# Patient Record
Sex: Female | Born: 1945 | Race: White | Hispanic: No | Smoking: Former smoker
Health system: Southern US, Community
[De-identification: ages and names within clinical notes are randomized; demographics above are authoritative.]

## PROBLEM LIST (undated history)

## (undated) DIAGNOSIS — I219 Acute myocardial infarction, unspecified: Secondary | ICD-10-CM

## (undated) DIAGNOSIS — I509 Heart failure, unspecified: Secondary | ICD-10-CM

## (undated) DIAGNOSIS — J449 Chronic obstructive pulmonary disease, unspecified: Secondary | ICD-10-CM

## (undated) DIAGNOSIS — T8859XA Other complications of anesthesia, initial encounter: Secondary | ICD-10-CM

## (undated) DIAGNOSIS — I639 Cerebral infarction, unspecified: Secondary | ICD-10-CM

## (undated) DIAGNOSIS — H269 Unspecified cataract: Secondary | ICD-10-CM

## (undated) DIAGNOSIS — R011 Cardiac murmur, unspecified: Secondary | ICD-10-CM

## (undated) DIAGNOSIS — F329 Major depressive disorder, single episode, unspecified: Secondary | ICD-10-CM

## (undated) DIAGNOSIS — I739 Peripheral vascular disease, unspecified: Secondary | ICD-10-CM

## (undated) DIAGNOSIS — M1990 Unspecified osteoarthritis, unspecified site: Secondary | ICD-10-CM

## (undated) DIAGNOSIS — J181 Lobar pneumonia, unspecified organism: Secondary | ICD-10-CM

## (undated) DIAGNOSIS — T4145XA Adverse effect of unspecified anesthetic, initial encounter: Secondary | ICD-10-CM

## (undated) DIAGNOSIS — T7840XA Allergy, unspecified, initial encounter: Secondary | ICD-10-CM

## (undated) DIAGNOSIS — I10 Essential (primary) hypertension: Secondary | ICD-10-CM

## (undated) DIAGNOSIS — I471 Supraventricular tachycardia: Secondary | ICD-10-CM

## (undated) DIAGNOSIS — F17200 Nicotine dependence, unspecified, uncomplicated: Secondary | ICD-10-CM

## (undated) DIAGNOSIS — Q2112 Patent foramen ovale: Secondary | ICD-10-CM

## (undated) DIAGNOSIS — Z8709 Personal history of other diseases of the respiratory system: Secondary | ICD-10-CM

## (undated) DIAGNOSIS — K219 Gastro-esophageal reflux disease without esophagitis: Secondary | ICD-10-CM

## (undated) DIAGNOSIS — I82409 Acute embolism and thrombosis of unspecified deep veins of unspecified lower extremity: Secondary | ICD-10-CM

## (undated) DIAGNOSIS — I499 Cardiac arrhythmia, unspecified: Secondary | ICD-10-CM

## (undated) DIAGNOSIS — F32A Depression, unspecified: Secondary | ICD-10-CM

## (undated) DIAGNOSIS — M797 Fibromyalgia: Secondary | ICD-10-CM

## (undated) DIAGNOSIS — Z9981 Dependence on supplemental oxygen: Secondary | ICD-10-CM

## (undated) DIAGNOSIS — K449 Diaphragmatic hernia without obstruction or gangrene: Secondary | ICD-10-CM

## (undated) DIAGNOSIS — F419 Anxiety disorder, unspecified: Secondary | ICD-10-CM

## (undated) DIAGNOSIS — J984 Other disorders of lung: Secondary | ICD-10-CM

## (undated) DIAGNOSIS — E785 Hyperlipidemia, unspecified: Secondary | ICD-10-CM

## (undated) DIAGNOSIS — I4719 Other supraventricular tachycardia: Secondary | ICD-10-CM

## (undated) DIAGNOSIS — M810 Age-related osteoporosis without current pathological fracture: Secondary | ICD-10-CM

## (undated) DIAGNOSIS — K5792 Diverticulitis of intestine, part unspecified, without perforation or abscess without bleeding: Secondary | ICD-10-CM

## (undated) DIAGNOSIS — H35039 Hypertensive retinopathy, unspecified eye: Secondary | ICD-10-CM

## (undated) DIAGNOSIS — E669 Obesity, unspecified: Secondary | ICD-10-CM

## (undated) DIAGNOSIS — R0902 Hypoxemia: Secondary | ICD-10-CM

## (undated) DIAGNOSIS — Q211 Atrial septal defect: Secondary | ICD-10-CM

## (undated) HISTORY — DX: Essential (primary) hypertension: I10

## (undated) HISTORY — PX: ABDOMINAL HYSTERECTOMY: SHX81

## (undated) HISTORY — DX: Cardiac murmur, unspecified: R01.1

## (undated) HISTORY — PX: TOTAL ABDOMINAL HYSTERECTOMY: SHX209

## (undated) HISTORY — DX: Anxiety disorder, unspecified: F41.9

## (undated) HISTORY — DX: Heart failure, unspecified: I50.9

## (undated) HISTORY — DX: Major depressive disorder, single episode, unspecified: F32.9

## (undated) HISTORY — DX: Chronic obstructive pulmonary disease, unspecified: J44.9

## (undated) HISTORY — PX: CATARACT EXTRACTION: SUR2

## (undated) HISTORY — DX: Diverticulitis of intestine, part unspecified, without perforation or abscess without bleeding: K57.92

## (undated) HISTORY — DX: Allergy, unspecified, initial encounter: T78.40XA

## (undated) HISTORY — DX: Hyperlipidemia, unspecified: E78.5

## (undated) HISTORY — DX: Diaphragmatic hernia without obstruction or gangrene: K44.9

## (undated) HISTORY — DX: Depression, unspecified: F32.A

## (undated) HISTORY — DX: Other disorders of lung: J98.4

## (undated) HISTORY — DX: Hypoxemia: R09.02

## (undated) HISTORY — DX: Acute embolism and thrombosis of unspecified deep veins of unspecified lower extremity: I82.409

## (undated) HISTORY — DX: Gastro-esophageal reflux disease without esophagitis: K21.9

## (undated) HISTORY — DX: Cerebral infarction, unspecified: I63.9

## (undated) HISTORY — DX: Patent foramen ovale: Q21.12

## (undated) HISTORY — DX: Supraventricular tachycardia: I47.1

## (undated) HISTORY — DX: Age-related osteoporosis without current pathological fracture: M81.0

## (undated) HISTORY — PX: JOINT REPLACEMENT: SHX530

## (undated) HISTORY — DX: Unspecified cataract: H26.9

## (undated) HISTORY — DX: Hypertensive retinopathy, unspecified eye: H35.039

## (undated) HISTORY — DX: Lobar pneumonia, unspecified organism: J18.1

## (undated) HISTORY — DX: Fibromyalgia: M79.7

## (undated) HISTORY — DX: Unspecified osteoarthritis, unspecified site: M19.90

## (undated) HISTORY — DX: Cardiac arrhythmia, unspecified: I49.9

## (undated) HISTORY — DX: Nicotine dependence, unspecified, uncomplicated: F17.200

## (undated) HISTORY — DX: Atrial septal defect: Q21.1

## (undated) HISTORY — PX: EYE SURGERY: SHX253

## (undated) HISTORY — DX: Acute myocardial infarction, unspecified: I21.9

## (undated) HISTORY — PX: TUBAL LIGATION: SHX77

## (undated) HISTORY — DX: Other supraventricular tachycardia: I47.19

## (undated) HISTORY — DX: Obesity, unspecified: E66.9

## (undated) HISTORY — PX: MOUTH SURGERY: SHX715

---

## 1998-11-02 HISTORY — PX: KNEE ARTHROSCOPY: SUR90

## 1999-07-02 ENCOUNTER — Ambulatory Visit (HOSPITAL_COMMUNITY): Admission: RE | Admit: 1999-07-02 | Discharge: 1999-07-02 | Payer: Self-pay | Admitting: Emergency Medicine

## 2001-03-07 ENCOUNTER — Ambulatory Visit (HOSPITAL_BASED_OUTPATIENT_CLINIC_OR_DEPARTMENT_OTHER): Admission: RE | Admit: 2001-03-07 | Discharge: 2001-03-08 | Payer: Self-pay | Admitting: Orthopedic Surgery

## 2002-06-06 ENCOUNTER — Ambulatory Visit (HOSPITAL_COMMUNITY): Admission: RE | Admit: 2002-06-06 | Discharge: 2002-06-06 | Payer: Self-pay | Admitting: Emergency Medicine

## 2003-03-22 ENCOUNTER — Ambulatory Visit (HOSPITAL_COMMUNITY): Admission: RE | Admit: 2003-03-22 | Discharge: 2003-03-22 | Payer: Self-pay | Admitting: Gastroenterology

## 2003-08-22 ENCOUNTER — Encounter: Payer: Self-pay | Admitting: Gastroenterology

## 2003-08-22 ENCOUNTER — Ambulatory Visit (HOSPITAL_COMMUNITY): Admission: RE | Admit: 2003-08-22 | Discharge: 2003-08-22 | Payer: Self-pay | Admitting: Gastroenterology

## 2004-12-26 HISTORY — PX: CARDIOVASCULAR STRESS TEST: SHX262

## 2005-01-07 ENCOUNTER — Ambulatory Visit (HOSPITAL_COMMUNITY): Admission: RE | Admit: 2005-01-07 | Discharge: 2005-01-07 | Payer: Self-pay | Admitting: Emergency Medicine

## 2005-03-16 ENCOUNTER — Emergency Department (HOSPITAL_COMMUNITY): Admission: EM | Admit: 2005-03-16 | Discharge: 2005-03-16 | Payer: Self-pay | Admitting: Emergency Medicine

## 2005-06-29 ENCOUNTER — Inpatient Hospital Stay (HOSPITAL_COMMUNITY): Admission: RE | Admit: 2005-06-29 | Discharge: 2005-07-09 | Payer: Self-pay | Admitting: Orthopedic Surgery

## 2005-07-01 ENCOUNTER — Ambulatory Visit: Payer: Self-pay | Admitting: Emergency Medicine

## 2005-07-07 ENCOUNTER — Encounter: Payer: Self-pay | Admitting: Cardiology

## 2005-07-09 ENCOUNTER — Inpatient Hospital Stay
Admission: RE | Admit: 2005-07-09 | Discharge: 2005-07-14 | Payer: Self-pay | Admitting: Physical Medicine & Rehabilitation

## 2005-07-09 ENCOUNTER — Ambulatory Visit: Payer: Self-pay | Admitting: Physical Medicine & Rehabilitation

## 2005-08-25 ENCOUNTER — Emergency Department (HOSPITAL_COMMUNITY): Admission: EM | Admit: 2005-08-25 | Discharge: 2005-08-25 | Payer: Self-pay | Admitting: Emergency Medicine

## 2005-11-05 ENCOUNTER — Encounter: Admission: RE | Admit: 2005-11-05 | Discharge: 2005-11-05 | Payer: Self-pay | Admitting: Orthopedic Surgery

## 2008-05-01 ENCOUNTER — Ambulatory Visit (HOSPITAL_COMMUNITY): Admission: RE | Admit: 2008-05-01 | Discharge: 2008-05-01 | Payer: Self-pay | Admitting: Emergency Medicine

## 2008-06-15 ENCOUNTER — Encounter: Admission: RE | Admit: 2008-06-15 | Discharge: 2008-08-13 | Payer: Self-pay | Admitting: Emergency Medicine

## 2008-09-03 ENCOUNTER — Encounter: Admission: RE | Admit: 2008-09-03 | Discharge: 2008-09-03 | Payer: Self-pay | Admitting: Emergency Medicine

## 2009-04-16 ENCOUNTER — Encounter: Admission: RE | Admit: 2009-04-16 | Discharge: 2009-04-16 | Payer: Self-pay | Admitting: Orthopedic Surgery

## 2009-04-20 ENCOUNTER — Encounter: Admission: RE | Admit: 2009-04-20 | Discharge: 2009-04-20 | Payer: Self-pay | Admitting: Orthopedic Surgery

## 2009-05-29 ENCOUNTER — Ambulatory Visit (HOSPITAL_COMMUNITY): Admission: RE | Admit: 2009-05-29 | Discharge: 2009-05-29 | Payer: Self-pay | Admitting: Orthopedic Surgery

## 2009-06-04 ENCOUNTER — Ambulatory Visit: Payer: Self-pay | Admitting: Internal Medicine

## 2009-06-04 DIAGNOSIS — I10 Essential (primary) hypertension: Secondary | ICD-10-CM | POA: Insufficient documentation

## 2009-06-04 DIAGNOSIS — J438 Other emphysema: Secondary | ICD-10-CM | POA: Insufficient documentation

## 2009-06-04 DIAGNOSIS — F329 Major depressive disorder, single episode, unspecified: Secondary | ICD-10-CM | POA: Insufficient documentation

## 2009-06-04 DIAGNOSIS — F17200 Nicotine dependence, unspecified, uncomplicated: Secondary | ICD-10-CM

## 2009-06-04 DIAGNOSIS — Z9189 Other specified personal risk factors, not elsewhere classified: Secondary | ICD-10-CM | POA: Insufficient documentation

## 2009-06-04 DIAGNOSIS — K219 Gastro-esophageal reflux disease without esophagitis: Secondary | ICD-10-CM | POA: Insufficient documentation

## 2009-06-04 HISTORY — DX: Nicotine dependence, unspecified, uncomplicated: F17.200

## 2009-06-05 ENCOUNTER — Encounter: Payer: Self-pay | Admitting: Internal Medicine

## 2009-06-18 ENCOUNTER — Ambulatory Visit: Payer: Self-pay | Admitting: Pulmonary Disease

## 2009-07-09 ENCOUNTER — Encounter: Payer: Self-pay | Admitting: Pulmonary Disease

## 2009-07-09 ENCOUNTER — Ambulatory Visit (HOSPITAL_BASED_OUTPATIENT_CLINIC_OR_DEPARTMENT_OTHER): Admission: RE | Admit: 2009-07-09 | Discharge: 2009-07-09 | Payer: Self-pay | Admitting: Pulmonary Disease

## 2009-07-18 ENCOUNTER — Ambulatory Visit: Payer: Self-pay | Admitting: Pulmonary Disease

## 2009-07-24 ENCOUNTER — Ambulatory Visit: Payer: Self-pay | Admitting: Pulmonary Disease

## 2009-07-29 ENCOUNTER — Telehealth: Payer: Self-pay | Admitting: Pulmonary Disease

## 2009-08-01 ENCOUNTER — Telehealth (INDEPENDENT_AMBULATORY_CARE_PROVIDER_SITE_OTHER): Payer: Self-pay | Admitting: *Deleted

## 2009-08-19 ENCOUNTER — Ambulatory Visit: Admission: RE | Admit: 2009-08-19 | Discharge: 2009-08-19 | Payer: Self-pay | Admitting: Internal Medicine

## 2009-08-19 ENCOUNTER — Ambulatory Visit: Payer: Self-pay | Admitting: Internal Medicine

## 2009-08-22 ENCOUNTER — Telehealth: Payer: Self-pay | Admitting: Internal Medicine

## 2009-09-04 ENCOUNTER — Ambulatory Visit: Payer: Self-pay | Admitting: Internal Medicine

## 2009-09-11 ENCOUNTER — Encounter: Admission: RE | Admit: 2009-09-11 | Discharge: 2009-10-15 | Payer: Self-pay | Admitting: Emergency Medicine

## 2009-09-23 ENCOUNTER — Ambulatory Visit: Payer: Self-pay | Admitting: Internal Medicine

## 2009-10-08 ENCOUNTER — Ambulatory Visit: Payer: Self-pay | Admitting: Cardiovascular Disease

## 2009-10-14 ENCOUNTER — Ambulatory Visit: Payer: Self-pay | Admitting: Internal Medicine

## 2009-10-14 DIAGNOSIS — Q211 Atrial septal defect: Secondary | ICD-10-CM | POA: Insufficient documentation

## 2009-10-14 DIAGNOSIS — J984 Other disorders of lung: Secondary | ICD-10-CM

## 2009-10-14 HISTORY — DX: Other disorders of lung: J98.4

## 2009-11-19 ENCOUNTER — Encounter: Payer: Self-pay | Admitting: Internal Medicine

## 2009-11-20 ENCOUNTER — Encounter: Payer: Self-pay | Admitting: Internal Medicine

## 2009-11-20 HISTORY — PX: US ECHOCARDIOGRAPHY: HXRAD669

## 2009-11-21 ENCOUNTER — Telehealth (INDEPENDENT_AMBULATORY_CARE_PROVIDER_SITE_OTHER): Payer: Self-pay | Admitting: *Deleted

## 2009-12-03 ENCOUNTER — Encounter: Payer: Self-pay | Admitting: Internal Medicine

## 2009-12-13 ENCOUNTER — Encounter: Payer: Self-pay | Admitting: Internal Medicine

## 2009-12-20 ENCOUNTER — Encounter: Payer: Self-pay | Admitting: Cardiovascular Disease

## 2009-12-20 ENCOUNTER — Ambulatory Visit: Payer: Self-pay

## 2010-01-20 ENCOUNTER — Ambulatory Visit: Payer: Self-pay | Admitting: Internal Medicine

## 2010-02-28 ENCOUNTER — Encounter: Admission: RE | Admit: 2010-02-28 | Discharge: 2010-02-28 | Payer: Self-pay | Admitting: Emergency Medicine

## 2010-04-16 ENCOUNTER — Ambulatory Visit: Payer: Self-pay | Admitting: Internal Medicine

## 2010-04-16 ENCOUNTER — Encounter (INDEPENDENT_AMBULATORY_CARE_PROVIDER_SITE_OTHER): Payer: Self-pay | Admitting: Surgery

## 2010-04-16 ENCOUNTER — Inpatient Hospital Stay (HOSPITAL_COMMUNITY): Admission: RE | Admit: 2010-04-16 | Discharge: 2010-04-24 | Payer: Self-pay | Admitting: Surgery

## 2010-04-16 HISTORY — PX: LAPAROSCOPIC CHOLECYSTECTOMY: SUR755

## 2010-04-18 ENCOUNTER — Encounter (INDEPENDENT_AMBULATORY_CARE_PROVIDER_SITE_OTHER): Payer: Self-pay | Admitting: Surgery

## 2010-05-16 ENCOUNTER — Telehealth: Payer: Self-pay | Admitting: Internal Medicine

## 2010-06-04 ENCOUNTER — Ambulatory Visit: Payer: Self-pay | Admitting: Internal Medicine

## 2010-06-04 DIAGNOSIS — J018 Other acute sinusitis: Secondary | ICD-10-CM | POA: Insufficient documentation

## 2010-06-12 ENCOUNTER — Encounter (HOSPITAL_COMMUNITY): Admission: RE | Admit: 2010-06-12 | Discharge: 2010-08-01 | Payer: Self-pay | Admitting: Internal Medicine

## 2010-07-01 ENCOUNTER — Ambulatory Visit: Payer: Self-pay | Admitting: Cardiology

## 2010-07-10 ENCOUNTER — Telehealth (INDEPENDENT_AMBULATORY_CARE_PROVIDER_SITE_OTHER): Payer: Self-pay | Admitting: *Deleted

## 2010-07-10 ENCOUNTER — Encounter: Payer: Self-pay | Admitting: Internal Medicine

## 2010-07-11 ENCOUNTER — Ambulatory Visit: Payer: Self-pay | Admitting: Emergency Medicine

## 2010-07-11 DIAGNOSIS — J069 Acute upper respiratory infection, unspecified: Secondary | ICD-10-CM | POA: Insufficient documentation

## 2010-07-16 ENCOUNTER — Telehealth (INDEPENDENT_AMBULATORY_CARE_PROVIDER_SITE_OTHER): Payer: Self-pay | Admitting: *Deleted

## 2010-07-28 ENCOUNTER — Ambulatory Visit: Payer: Self-pay | Admitting: Internal Medicine

## 2010-07-28 ENCOUNTER — Telehealth (INDEPENDENT_AMBULATORY_CARE_PROVIDER_SITE_OTHER): Payer: Self-pay | Admitting: *Deleted

## 2010-09-22 ENCOUNTER — Ambulatory Visit: Payer: Self-pay | Admitting: Cardiology

## 2010-11-23 ENCOUNTER — Encounter: Payer: Self-pay | Admitting: Orthopedic Surgery

## 2010-11-23 ENCOUNTER — Encounter: Payer: Self-pay | Admitting: Emergency Medicine
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
## 2010-12-04 NOTE — Miscellaneous (Signed)
Summary: Orders Update  Clinical Lists Changes  Orders: Added new Test order of Venous Duplex Lower Extremity (Venous Duplex Lower) - Signed 

## 2010-12-04 NOTE — Miscellaneous (Signed)
Summary: ct chest   Clinical Lists Changes  Orders: Added new Referral order of Radiology Referral (Radiology) - Signed

## 2010-12-04 NOTE — Progress Notes (Signed)
Summary: ct scan  Phone Note Call from Patient   Caller: Patient Call For: Xiao Graul Summary of Call: pt concern that medicare will not pay for ct scan of the chest without contrast which is to be done on 07/01/2010 Initial call taken by: Rickard Patience,  May 16, 2010 10:20 AM  Follow-up for Phone Call        called and spoke with pt and she is aware that the ct scan has to be approved prior to her ct scan.  appt made for hfu for 8-3 at 2:10 wtih MR.  pt is aware. Randell Loop CMA  May 16, 2010 10:29 AM

## 2010-12-04 NOTE — Assessment & Plan Note (Signed)
Summary: URI   Visit Type:  Acute visit Copy to:  Dr. Cleta Alberts, Dr Don Perking Primary Provider/Referring Provider:  Dr Cleta Alberts PMD, Dr Pearlean Brownie Neuro, Dr. Betti Cruz - Pscyh, Dr. Titus Dubin - Rheum, Dr. Marciano Sequin - Ortho,   CC:  Acute visit...Dr. Marchelle Gearing Laurie Horton...Laurie Horton c/o increased sob...wheezing...cough with white foamy mucus...sneezing and runny nose x1 day and Hypertension Management.  History of Present Illness: 12 yowf smoker with GOLD 2 COPD, 02 dep at hs chronically   OV8/12/2009: Since lst visit in march 2011, she got admitted ot ICU post elective chole with resp failure and bilateral LL consolidation/atelctasis. Since then has rallied around to being baseline health. Still reports stability in COPD. Compliant with symbicort and singulair. . Using O2 only at night. STill refusing to use it with exertion. No change in exertion. Able to walk mall. HAS QUIT SMOKING in interim. However, c/o new green sinus mucus whenshe gets up early in morning and after naps. Dnies worseing dyspnea, worsening cough, or sputum or orthopnea, or wheezing. Not sure when she will have shoulder surgery due to financial constraints.   acute visit 07/11/10 -- Followed by Dr Marchelle Gearing for COPD, GOLD2, hypoxemia. Doesn't use O2 reliably with exertion (documented desats at pulm rehab). Presents today c/o intermittant chest tightness, mid-sternal CP, more wheezing. More sputum production thick and white. Has had nasal gtt, some URI symptoms. Feels wheezy now, worse when ambulating.      Hypertension History:      Positive major cardiovascular risk factors include female age 65 years old or older and hypertension.  Negative major cardiovascular risk factors include non-tobacco-user status.        Positive history for target organ damage include ASHD (either angina/prior MI/prior CABG).    Current Medications (verified): 1)  Cyclobenzaprine Hcl 10 Mg Tabs (Cyclobenzaprine Hcl) .... Take 1 Tablet Two Times A Day 2)  Isosorbide  Dinitrate 30 Mg Tabs (Isosorbide Dinitrate) .... Take 1 Tablet By Mouth Once A Day 3)  Trazodone Hcl 100 Mg Tabs (Trazodone Hcl) .... Take 3  Tablet By Mouth At Bedtime 4)  Pristiq 100 Mg Xr24h-Tab (Desvenlafaxine Succinate) .... Take 1 Tablet By Mouth Once A Day 5)  Toprol Xl 25 Mg Xr24h-Tab (Metoprolol Succinate) .... 1/2 Tablet Per Day 6)  Oxybutynin Chloride 5 Mg Tabs (Oxybutynin Chloride) .... Take 1 Tablet By Mouth Once A Day 7)  Norvasc 5 Mg Tabs (Amlodipine Besylate) .... Take 1 Tablet By Mouth Once A Day 8)  Lyrica 150 Mg Caps (Pregabalin) .... Take 1 Tablet By Mouth Once A Day At Bedtime 9)  Singulair 10 Mg Tabs (Montelukast Sodium) .... Take 1 Tablet By Mouth Once A Day 10)  Lipitor 80 Mg Tabs (Atorvastatin Calcium) .... Take 1 Tablet By Mouth Once A Day in The Evening 11)  Aciphex 20 Mg Tbec (Rabeprazole Sodium) .... Take 1 Tablet By Mouth Two Times A Day 12)  Lidoderm 5 % Ptch (Lidocaine) .... 3 Patches Daily 13)  Vicodin 5-500 Mg Tabs (Hydrocodone-Acetaminophen) .... As Needed 14)  Fish Oil 1000 Mg Caps (Omega-3 Fatty Acids) .... Once Daily 15)  Symbicort 80-4.5 Mcg/act Aero (Budesonide-Formoterol Fumarate) .... Take 2 Puffs Once Daily 16)  Oxygen 2 Liters .... At Naptime and Bedtime 17)  Vitamin B12 .... Once Daily 18)  Co Q10 .... Once Daily 19)  Vitamin C 500 Mg Chew (Ascorbic Acid) .... Once Daily 20)  Glucosamine-Chondroitin  Caps (Glucosamine-Chondroit-Vit C-Mn) .... Once Daily 21)  Aspirin 325 Mg Tabs (Aspirin) .... Take 1 Tablet By  Mouth Once A Day  Allergies (verified): 1)  ! Xanax 2)  ! Iodine 3)  ! * Mycins 4)  ! * Ciclor 5)  ! * Nexium 6)  ! * Lunesta 7)  ! * Ambien 8)  ! * Vesicare 9)  ! * Spiriva  Vital Signs:  Laurie Horton profile:   65 year old female Height:      64.5 inches (163.83 cm) Weight:      189.38 pounds (86.08 kg) BMI:     32.12 O2 Sat:      95 % on Room air Temp:     98.2 degrees F (36.78 degrees C) oral Pulse rate:   79 / minute BP  sitting:   122 / 80  (right arm) Cuff size:   regular  Vitals Entered By: Michel Bickers CMA (July 11, 2010 11:27 AM)  O2 Sat at Rest %:  95 O2 Flow:  Room air CC: Acute visit...Dr. Marchelle Gearing Laurie Horton...Laurie Horton c/o increased sob...wheezing...cough with white foamy mucus...sneezing and runny nose x1 day, Hypertension Management Comments Medications reviewed with the Laurie Horton. Daytime phone verified. Michel Bickers [facility]  July 11, 2010 11:28 AM   Physical Exam  General:  well developed, well nourished, in no acute distressobese.   Head:  normocephalic and atraumatic Eyes:  conjunctiva and sclera clear Nose:  no deformity, discharge, inflammation, or lesions Mouth:  no deformity or lesions Neck:  no masses, thyromegaly, or abnormal cervical nodes Chest Wall:  no deformities noted Lungs:  decreased BS bilateral and prolonged exhilation.   Heart:  regular rate and rhythm, S1, S2 without murmurs, rubs, gallops, or clicks Abdomen:  bowel sounds positive; abdomen soft and non-tender without masses, or organomegaly Msk:  no deformity or scoliosis noted with normal posture Pulses:  pulses normal Extremities:  no clubbing, cyanosis, edema, or deformity noted Neurologic:  CN II-XII grossly intact with normal reflexes, coordination, muscle strength and tone Skin:  intact without lesions or rashes Cervical Nodes:  no significant adenopathy Axillary Nodes:  no significant adenopathy Psych:  alert and cooperative; normal mood and affect; normal attention span and concentration   Impression & Recommendations:  Problem # 1:  UPPER RESPIRATORY INFECTION, VIRAL (ICD-465.9)  No evidence AE COPD yet, but at risk for this. Reviewed symptoms to watch for. Sh ewill call us if evolving - symptomatic relief  Orders: Est. Laurie Horton Level III (19147)  Problem # 2:  C O P D (ICD-496)  - BD's as ordered  Medications Added to Medication List This Visit: 1)  Fish Oil 1000 Mg Caps (Omega-3 fatty acids)  .... Once daily 2)  Symbicort 80-4.5 Mcg/act Aero (Budesonide-formoterol fumarate) .... Take 2 puffs once daily  Hypertension Assessment/Plan:      The Laurie Horton's hypertensive risk group is category C: Target organ damage and/or diabetes.  Today's blood pressure is 122/80.     Laurie Horton Instructions: 1)  You may use decongestants that contain bromphenerimine or chlorphenerimine, as directed.  2)  CALL OUR OFFICE if you develop any changes in your mucous production, change in color or amount. call if your breathing or wheeze worsen in any way.  3)  Follow up with Dr Marchelle Gearing in 2-3 weeks

## 2010-12-04 NOTE — Letter (Signed)
Summary: [facility]  [facility]   Imported By: Sherian Rein 12/13/2009 08:28:32  _____________________________________________________________________  External Attachment:    Type:   Image     Comment:   External Document
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
## 2010-12-04 NOTE — Progress Notes (Signed)
Summary: FYI  Phone Note Call from Patient   Caller: Patient Call For: RAMASWAMY Summary of Call: PT WAS TOLD TO CALL AND GIVE MED LIST :VITAMIN C 1000MG  B/12 GLUCOSAMINE 1500MG  CHONDROIDIN 1200MG  CO 10 50MG  CALCIUM 600MG  VENTOR IS [place] Initial call taken by: Rickard Patience,  July 28, 2010 11:43 AM  Follow-up for Phone Call        Spoke with pt and verified meds that need to be added to list.  Med list was updated. Follow-up by: Vernie Murders,  July 28, 2010 11:55 AM    New/Updated Medications: CALCIUM-D 600-200 MG-UNIT TABS (CALCIUM CARBONATE-VITAMIN D) 1 once daily VITAMIN C 1000 MG TABS (ASCORBIC ACID) 1 once daily VITAMIN B-12 500 MCG TABS (CYANOCOBALAMIN) 1 once daily GLUCOSAMINE 1500 COMPLEX  CAPS (GLUCOSAMINE-CHONDROIT-VIT C-MN) 1 once daily * CHONDROITON 1200 MG 1 once daily COQ-10 50 MG CAPS (COENZYME Q10) 1 once daily

## 2010-12-04 NOTE — Letter (Signed)
Summary: [facility]   Imported By: Sherian Rein 12/13/2009 08:46:20  _____________________________________________________________________  External Attachment:    Type:   Image     Comment:   External Document

## 2010-12-04 NOTE — Progress Notes (Signed)
Summary: office notes  Phone Note Call from Patient   Caller: Patient Call For: ramaswamy Complaint: Earache/Ear Infection Summary of Call: Please fax 12/10 Office notes to Dr. Elvis Coil Office Attn: Synetta Fail. Initial call taken by: Eugene Gavia,  November 21, 2009 5:05 PM  Follow-up for Phone Call        faxed notes 1/21//Juanita Follow-up by: Darletta Moll,  November 22, 2009 10:28 AM

## 2010-12-04 NOTE — Letter (Signed)
Summary: [facility]  [facility]   Imported By: Lester [place] 12/19/2009 09:20:56  _____________________________________________________________________  External Attachment:    Type:   Image     Comment:   External Document

## 2010-12-04 NOTE — Progress Notes (Signed)
Summary: quiting Pulm rehab  Phone Note Call from Patient Call back at Home Phone [phone]   Caller: Patient Call For: ramaswamy Reason for Call: Talk to Nurse Summary of Call: pt has attended Pulmonary rehab for 1 month.  She says she is being pressured everytime she goes that she needs to wear her 02 more or that she may need to buy excercise equip., says everytime she goes they tel  her she needs to do, wear or buy something else.  She said she is not going back, and just wanted to let you know. Initial call taken by: Eugene Gavia,  July 16, 2010 9:46 AM  Follow-up for Phone Call        Pt states she is not going back to pulmonary rehab because she states they pressure her to wear her oxygen more, and also pressure her with other recs and she does not want to go back. FYI for MR. Carron Curie CMA  July 16, 2010 10:38 AM   Additional Follow-up for Phone Call Additional follow up Details #1::        It is her free choice. We can only advise.  Additional Follow-up by: [name] MD,  July 16, 2010 2:31 PM    Additional Follow-up for Phone Call Additional follow up Details #2::    Called, spoke with pt.  She was informed per MR, he can only advise what he thinks is best but it is her free choice. She verbalized understanding.   Follow-up by: Gweneth Dimitri RN,  July 16, 2010 2:42 PM
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
## 2010-12-04 NOTE — Progress Notes (Signed)
Summary: ct results given and acute visit sched   Phone Note Call from Patient Call back at Home Phone [phone]   Caller: Patient Call For: ramaswamy Reason for Call: Talk to Nurse, Lab or Test Results Summary of Call: pt would like results of CT scan. Initial call taken by: Eugene Gavia,  July 10, 2010 4:10 PM  Follow-up for Phone Call        Spoke with pt and gave ct chest results per append.  Pt verbalized understanding.  She is c/o increased SOB, wheezing, and fluid retention since this am.  OV with RB tommorrow am 07/11/10 at 11 am.  Advised ER sooner if worsens. Follow-up by: Vernie Murders,  July 10, 2010 4:17 PM
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
## 2011-01-01 ENCOUNTER — Other Ambulatory Visit: Payer: Self-pay | Admitting: Emergency Medicine

## 2011-01-01 DIAGNOSIS — N2889 Other specified disorders of kidney and ureter: Secondary | ICD-10-CM

## 2011-01-05 ENCOUNTER — Encounter: Payer: Self-pay | Admitting: Internal Medicine

## 2011-01-05 ENCOUNTER — Ambulatory Visit (INDEPENDENT_AMBULATORY_CARE_PROVIDER_SITE_OTHER): Payer: Medicare Other | Admitting: Internal Medicine

## 2011-01-05 DIAGNOSIS — J449 Chronic obstructive pulmonary disease, unspecified: Secondary | ICD-10-CM

## 2011-01-05 DIAGNOSIS — J019 Acute sinusitis, unspecified: Secondary | ICD-10-CM
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
## 2011-01-18 LAB — BLOOD GAS, ARTERIAL
Acid-Base Excess: 1.6 mmol/L (ref 0.0–2.0)
Acid-Base Excess: 4.1 mmol/L — ABNORMAL HIGH (ref 0.0–2.0)
Bicarbonate: 28.1 mEq/L — ABNORMAL HIGH (ref 20.0–24.0)
Bicarbonate: 30.4 mEq/L — ABNORMAL HIGH (ref 20.0–24.0)
Drawn by: 331461
Drawn by: 331471
FIO2: 0.5 %
FIO2: 0.6 %
O2 Saturation: 81.5 %
Patient temperature: 98.6
Patient temperature: 99.4
TCO2: 24.6 mmol/L (ref 0–100)
TCO2: 26.6 mmol/L (ref 0–100)
TCO2: 27.1 mmol/L (ref 0–100)
pCO2 arterial: 41.6 mmHg (ref 35.0–45.0)
pCO2 arterial: 50.3 mmHg — ABNORMAL HIGH (ref 35.0–45.0)
pCO2 arterial: 55.1 mmHg — ABNORMAL HIGH (ref 35.0–45.0)
pCO2 arterial: 65.8 mmHg (ref 35.0–45.0)
pH, Arterial: 7.363 (ref 7.350–7.400)
pH, Arterial: 7.397 (ref 7.350–7.400)
pH, Arterial: 7.445 — ABNORMAL HIGH (ref 7.350–7.400)
pO2, Arterial: 42.6 mmHg — ABNORMAL LOW (ref 80.0–100.0)

## 2011-01-18 LAB — CARDIAC PANEL(CRET KIN+CKTOT+MB+TROPI)
CK, MB: 1.9 ng/mL (ref 0.3–4.0)
CK, MB: 1.9 ng/mL (ref 0.3–4.0)
CK, MB: 2.1 ng/mL (ref 0.3–4.0)
CK, MB: 2.3 ng/mL (ref 0.3–4.0)
Relative Index: 1.8 (ref 0.0–2.5)
Relative Index: INVALID (ref 0.0–2.5)
Total CK: 34 U/L (ref 7–177)
Total CK: 82 U/L (ref 7–177)
Troponin I: 0.01 ng/mL (ref 0.00–0.06)
Troponin I: 0.02 ng/mL (ref 0.00–0.06)

## 2011-01-18 LAB — MAGNESIUM
Magnesium: 1.7 mg/dL (ref 1.5–2.5)
Magnesium: 1.9 mg/dL (ref 1.5–2.5)
Magnesium: 1.9 mg/dL (ref 1.5–2.5)

## 2011-01-18 LAB — BASIC METABOLIC PANEL
BUN: 10 mg/dL (ref 6–23)
BUN: 16 mg/dL (ref 6–23)
BUN: 21 mg/dL (ref 6–23)
CO2: 30 mEq/L (ref 19–32)
CO2: 31 mEq/L (ref 19–32)
Calcium: 8.8 mg/dL (ref 8.4–10.5)
Calcium: 8.9 mg/dL (ref 8.4–10.5)
Calcium: 9.3 mg/dL (ref 8.4–10.5)
Calcium: 9.5 mg/dL (ref 8.4–10.5)
Calcium: 9.6 mg/dL (ref 8.4–10.5)
Chloride: 93 mEq/L — ABNORMAL LOW (ref 96–112)
Chloride: 94 mEq/L — ABNORMAL LOW (ref 96–112)
Chloride: 96 mEq/L (ref 96–112)
Chloride: 96 mEq/L (ref 96–112)
Creatinine, Ser: 0.54 mg/dL (ref 0.4–1.2)
Creatinine, Ser: 0.64 mg/dL (ref 0.4–1.2)
Creatinine, Ser: 0.72 mg/dL (ref 0.4–1.2)
Creatinine, Ser: 0.78 mg/dL (ref 0.4–1.2)
Creatinine, Ser: 0.95 mg/dL (ref 0.4–1.2)
GFR calc Af Amer: >60 mL/min (ref >60)
GFR calc Af Amer: >60 mL/min (ref >60)
GFR calc Af Amer: >60 mL/min (ref >60)
GFR calc Af Amer: >60 mL/min (ref >60)
GFR calc Af Amer: >60 mL/min (ref >60)
GFR calc non Af Amer: >60 mL/min (ref >60)
GFR calc non Af Amer: >60 mL/min (ref >60)
GFR calc non Af Amer: >60 mL/min (ref >60)
GFR calc non Af Amer: >60 mL/min (ref >60)
Glucose, Bld: 100 mg/dL — ABNORMAL HIGH (ref 70–99)
Glucose, Bld: 110 mg/dL — ABNORMAL HIGH (ref 70–99)
Glucose, Bld: 120 mg/dL — ABNORMAL HIGH (ref 70–99)
Glucose, Bld: 139 mg/dL — ABNORMAL HIGH (ref 70–99)
Glucose, Bld: 142 mg/dL — ABNORMAL HIGH (ref 70–99)
Potassium: 3.1 mEq/L — ABNORMAL LOW (ref 3.5–5.1)
Potassium: 3.2 mEq/L — ABNORMAL LOW (ref 3.5–5.1)
Potassium: 3.4 mEq/L — ABNORMAL LOW (ref 3.5–5.1)
Potassium: 4.5 mEq/L (ref 3.5–5.1)
Sodium: 134 mEq/L — ABNORMAL LOW (ref 135–145)
Sodium: 134 mEq/L — ABNORMAL LOW (ref 135–145)
Sodium: 134 mEq/L — ABNORMAL LOW (ref 135–145)
Sodium: 136 mEq/L (ref 135–145)

## 2011-01-18 LAB — GLUCOSE, CAPILLARY
Glucose-Capillary: 106 mg/dL — ABNORMAL HIGH (ref 70–99)
Glucose-Capillary: 127 mg/dL — ABNORMAL HIGH (ref 70–99)
Glucose-Capillary: 131 mg/dL — ABNORMAL HIGH (ref 70–99)
Glucose-Capillary: 131 mg/dL — ABNORMAL HIGH (ref 70–99)
Glucose-Capillary: 139 mg/dL — ABNORMAL HIGH (ref 70–99)
Glucose-Capillary: 141 mg/dL — ABNORMAL HIGH (ref 70–99)
Glucose-Capillary: 141 mg/dL — ABNORMAL HIGH (ref 70–99)
Glucose-Capillary: 141 mg/dL — ABNORMAL HIGH (ref 70–99)
Glucose-Capillary: 142 mg/dL — ABNORMAL HIGH (ref 70–99)
Glucose-Capillary: 145 mg/dL — ABNORMAL HIGH (ref 70–99)
Glucose-Capillary: 154 mg/dL — ABNORMAL HIGH (ref 70–99)
Glucose-Capillary: 155 mg/dL — ABNORMAL HIGH (ref 70–99)
Glucose-Capillary: 165 mg/dL — ABNORMAL HIGH (ref 70–99)
Glucose-Capillary: 171 mg/dL — ABNORMAL HIGH (ref 70–99)
Glucose-Capillary: 192 mg/dL — ABNORMAL HIGH (ref 70–99)
Glucose-Capillary: 228 mg/dL — ABNORMAL HIGH (ref 70–99)

## 2011-01-18 LAB — CBC
HCT: 44.7 % (ref 36.0–46.0)
HCT: 47.9 % — ABNORMAL HIGH (ref 36.0–46.0)
Hemoglobin: 13.1 g/dL (ref 12.0–15.0)
Hemoglobin: 15 g/dL (ref 12.0–15.0)
Hemoglobin: 16.1 g/dL — ABNORMAL HIGH (ref 12.0–15.0)
MCHC: 33 g/dL (ref 30.0–36.0)
MCHC: 33.4 g/dL (ref 30.0–36.0)
MCV: 100.4 fL — ABNORMAL HIGH (ref 78.0–100.0)
MCV: 101.1 fL — ABNORMAL HIGH (ref 78.0–100.0)
MCV: 99.2 fL (ref 78.0–100.0)
Platelets: 181 10*3/uL (ref 150–400)
RBC: 3.94 MIL/uL (ref 3.87–5.11)
RBC: 4.22 MIL/uL (ref 3.87–5.11)
RBC: 4.47 MIL/uL (ref 3.87–5.11)
RBC: 4.65 MIL/uL (ref 3.87–5.11)
RBC: 4.78 MIL/uL (ref 3.87–5.11)
RDW: 13.2 % (ref 11.5–15.5)
RDW: 13.2 % (ref 11.5–15.5)
RDW: 13.7 % (ref 11.5–15.5)
WBC: 10.4 10*3/uL (ref 4.0–10.5)
WBC: 24.1 10*3/uL — ABNORMAL HIGH (ref 4.0–10.5)
WBC: 6.8 10*3/uL (ref 4.0–10.5)
WBC: 7.6 10*3/uL (ref 4.0–10.5)

## 2011-01-18 LAB — CULTURE, BLOOD (ROUTINE X 2): Culture: NO GROWTH

## 2011-01-18 LAB — PHOSPHORUS: Phosphorus: 3.7 mg/dL (ref 2.3–4.6)

## 2011-01-18 LAB — COMPREHENSIVE METABOLIC PANEL
CO2: 27 mEq/L (ref 19–32)
Calcium: 8.3 mg/dL — ABNORMAL LOW (ref 8.4–10.5)
Creatinine, Ser: 0.61 mg/dL (ref 0.4–1.2)
GFR calc Af Amer: >60 mL/min (ref >60)
GFR calc non Af Amer: >60 mL/min (ref >60)
Glucose, Bld: 125 mg/dL — ABNORMAL HIGH (ref 70–99)

## 2011-01-18 LAB — BRAIN NATRIURETIC PEPTIDE: Pro B Natriuretic peptide (BNP): 124 pg/mL — ABNORMAL HIGH (ref 0.0–100.0)

## 2011-01-18 LAB — PROCALCITONIN: Procalcitonin: <0.5 ng/mL

## 2011-01-18 LAB — LACTIC ACID, PLASMA: Lactic Acid, Venous: 1.7 mmol/L (ref 0.5–2.2)

## 2011-01-19 LAB — COMPREHENSIVE METABOLIC PANEL
Alkaline Phosphatase: 73 U/L (ref 39–117)
BUN: 12 mg/dL (ref 6–23)
Chloride: 99 mEq/L (ref 96–112)
Creatinine, Ser: 0.76 mg/dL (ref 0.4–1.2)
Glucose, Bld: 99 mg/dL (ref 70–99)
Potassium: 3.9 mEq/L (ref 3.5–5.1)
Total Bilirubin: 0.8 mg/dL (ref 0.3–1.2)
Total Protein: 6.5 g/dL (ref 6.0–8.3)

## 2011-01-19 LAB — DIFFERENTIAL
Lymphs Abs: 1.4 10*3/uL (ref 0.7–4.0)
Monocytes Relative: 8 % (ref 3–12)
Neutro Abs: 6.5 10*3/uL (ref 1.7–7.7)
Neutrophils Relative %: 75 % (ref 43–77)

## 2011-01-19 LAB — CBC
Hemoglobin: 16.1 g/dL — ABNORMAL HIGH (ref 12.0–15.0)
RBC: 4.66 MIL/uL (ref 3.87–5.11)
WBC: 8.7 10*3/uL (ref 4.0–10.5)

## 2011-02-03 ENCOUNTER — Telehealth: Payer: Self-pay | Admitting: Internal Medicine

## 2011-02-03 NOTE — Telephone Encounter (Signed)
Number 6 on her Medication list from 3/26 is Oxybutynin 5 mg every other day she wants this stricken from her record because she does not take this medicine and doesn't know what it is. She stated that this was an error on our part and she doesn't have any medication that she takes every other day. She can be reached at [phone].Vedia Coffer

## 2011-02-03 NOTE — Telephone Encounter (Signed)
Pt was last seen by MR on 01/05/11.  She has not yet been abstracted in Epic.  However, I do not see this medication in her chart in EMR.  Called [phone] - NA and unable to leave message.

## 2011-02-03 NOTE — Telephone Encounter (Signed)
Called, spoke with pt.  She was informed oxybutynin is not on her med list.  She states she was in here on 01/26/11 to see MR and it was then. I advised pt that we do not have a record that she seen him on this date and this medication is not on her active medication list now.  We do have record that she was in here on 01/05/11 to MR but nothing for 01/26/11.  Pt still insisting she was in here to see MR on 01/26/11.

## 2011-02-03 NOTE — Telephone Encounter (Signed)
ATC x 3 line was busy WCB 

## 2011-02-04 NOTE — Telephone Encounter (Signed)
Called, spoke with pt.  She is aware we do not have a record of her seeing MR on 01/26/11 and med not on her medication list.  She verbalized understanding of this but states she will bring in a copy of the paper she has stating she was in here on that date.  I advised her this is fine if this is what she wishes to do.  Advised she can have the front staff make a copy of it for her.  They will give [place] the copy of this paper.  She verbalized understanding of this.

## 2011-02-05 LAB — BLOOD GAS, ARTERIAL
Bicarbonate: 24.9 mEq/L — ABNORMAL HIGH (ref 20.0–24.0)
Patient temperature: 98.6
pCO2 arterial: 40.2 mmHg (ref 35.0–45.0)
pH, Arterial: 7.409 — ABNORMAL HIGH (ref 7.350–7.400)
pO2, Arterial: 56 mmHg — ABNORMAL LOW (ref 80.0–100.0)

## 2011-02-08 LAB — COMPREHENSIVE METABOLIC PANEL
ALT: 17 U/L (ref 0–35)
Alkaline Phosphatase: 75 U/L (ref 39–117)
Chloride: 101 mEq/L (ref 96–112)
Glucose, Bld: 125 mg/dL — ABNORMAL HIGH (ref 70–99)
Potassium: 3.8 mEq/L (ref 3.5–5.1)
Sodium: 141 mEq/L (ref 135–145)
Total Protein: 6.3 g/dL (ref 6.0–8.3)

## 2011-02-08 LAB — CBC
Hemoglobin: 15.9 g/dL — ABNORMAL HIGH (ref 12.0–15.0)
RBC: 4.92 MIL/uL (ref 3.87–5.11)
RDW: 13.6 % (ref 11.5–15.5)
WBC: 6.6 10*3/uL (ref 4.0–10.5)

## 2011-02-08 LAB — DIFFERENTIAL
Basophils Relative: 0 % (ref 0–1)
Eosinophils Absolute: 0.1 10*3/uL (ref 0.0–0.7)
Monocytes Absolute: 0.5 10*3/uL (ref 0.1–1.0)
Monocytes Relative: 8 % (ref 3–12)
Neutrophils Relative %: 68 % (ref 43–77)

## 2011-02-08 LAB — PROTIME-INR: INR: 1.2 (ref 0.00–1.49)

## 2011-02-08 LAB — GLUCOSE, CAPILLARY: Glucose-Capillary: 115 mg/dL — ABNORMAL HIGH (ref 70–99)

## 2011-02-26 ENCOUNTER — Telehealth: Payer: Self-pay | Admitting: Internal Medicine

## 2011-02-26 DIAGNOSIS — J449 Chronic obstructive pulmonary disease, unspecified: Secondary | ICD-10-CM

## 2011-02-26 NOTE — Telephone Encounter (Signed)
Spoke w/ pt and she states she wants an order for her to have portable o2 so she can start exercising to lose weight. Pt also states she has 4 o2 tanks in her bedroom and has been their for 1 year now. Pt wants this to be picked up b/c it scares her to have this many tanks in her home. Please advise Dr. Marchelle Gearing. Thanks  Carver Fila, CMA    *Pt aware MR is out of the office today

## 2011-03-02 NOTE — Telephone Encounter (Signed)
atc # provided - NA and unable to leave message.  WCB
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
## 2011-03-02 NOTE — Telephone Encounter (Signed)
She can have portable o2 for convenience. Please do order  COPD exercise: . I know she went to rehab. Did she finish it ? I last recollect she did not find rehab beneficial . Please check if she is interested in going back to rehab or wants to exercise on own

## 2011-03-03 ENCOUNTER — Other Ambulatory Visit: Payer: Self-pay

## 2011-03-03 NOTE — Telephone Encounter (Signed)
ATC # provided to inform pt order sent to [facility] to have 4 o2 tanks in her bedroom removed - NA and unable to leave message.  WCB.

## 2011-03-03 NOTE — Telephone Encounter (Signed)
Ok send order

## 2011-03-04 NOTE — Telephone Encounter (Signed)
Spoke w/ pt and advised her order wass ent to have her big o2 tanks removed. Pt wanted to let Dr. Marchelle Gearing know that Dr. Leone Payor was going to admit her to the hospital either today or tomorrow. 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 Pt just wanted to make MR aware of this. Will forward to MR as an FYI.

## 2011-03-20 NOTE — Op Note (Signed)
   NAMEDAWNN, NAM                         ACCOUNT NO.:  [id number]   MEDICAL RECORD NO.:  [id number]                   PATIENT TYPE:  AMB   LOCATION:  ENDO                                 FACILITY:  [facility]   PHYSICIAN:  James L. Malon Kindle., M.D.          DATE OF BIRTH:  1945-11-16   DATE OF PROCEDURE:  03/22/2003  DATE OF DISCHARGE:                                 OPERATIVE REPORT   PROCEDURE PERFORMED:  Colonoscopy.   MEDICATIONS:  Fentanyl 100 mcg, Versed 10 mg IV.   INSTRUMENT USED:  Olympus pediatric colonoscope   INDICATIONS FOR PROCEDURE:  Colon cancer screening.  The patient just had an  endoscopy done just before this.   DESCRIPTION OF PROCEDURE:  The procedure had been explained to the patient  and consent obtained.  With the patient in the left lateral decubitus  position, the Olympus pediatric adjustable scope was inserted and advanced.  We advanced around to the cecum.  The ileocecal valve and appendiceal  orifice were seen.  The scope was withdrawn to the cecum.  Ascending colon,  transverse colon, descending and sigmoid colon were seen well.  No polyps or  other lesions seen.  No significant diverticular disease.  Rectum is free of  polyps.  The scope withdrawn.  The patient tolerated the procedure well.   ASSESSMENT:  Normal screening colonoscopy.   PLAN:  Routine follow up with yearly hemoccults.  Consider another  evaluation in ten years.                                               James L. Malon Kindle., M.D.    Waldron Session  D:  03/22/2003  T:  03/22/2003  Job:  [id number]   cc:   Brett Canales A. Cleta Alberts, M.D.  [address]  [place]  [place] [address]  Fax: [phone]

## 2011-03-20 NOTE — Consult Note (Signed)
NAMEKENZLIE, DISCH NO.:  [id number]   MEDICAL RECORD NO.:  [id number]          PATIENT TYPE:  INP   LOCATION:  1519                         FACILITY:  [facility]   PHYSICIAN:  Deirdre Peer. Polite, M.D. DATE OF BIRTH:  01/27/1946   DATE OF CONSULTATION:  06/30/2005  DATE OF DISCHARGE:                                   CONSULTATION   REASON FOR CONSULTATION:  Hypoxia, confusion.   HISTORY OF PRESENT ILLNESS:  A 65 year old female with multiple medical  problems who is postoperative day #1 after left total knee arthroscopy. On  rounds today, the patient was notified by P.A. to be confused and was found  to be hypoxic. [facility] was called for consultation in the interim.  CT was recommended to rule out PE. However, due to patient's contrast  allergy, there was delayed in getting CT. VQ has been ordered. Results are  pending at this time.   At the time of my evaluation, the patient is alert to self but still fairly  confused and constantly pulls off her oxygen. Her saturations range from 92%  on nasal cannula to 95% on face mask. However, the patient continues to  remove face mask or nasal cannula and at that time had drop in the  saturation to the mid 80s. The patient denies any chest pain, shortness of  breath. Denies any cough. Denies any fevers or chills. Denies any abdominal  pain. When asked why she is in the hospital, the patient is unaware of why  she is in the hospital. As stated, VQ scan is pending. The patient's ABG on  face mask shows pH of 7.3, pCO2 of 50, and a pO2 of 63.4; that is 50% face  mask. Other labs are pending. I recommend transfer to a step-down unit for  further management.   PAST MEDICAL HISTORY:  Per admission H&P includes:  1.  Major depression.  2.  Hypertension.  3.  Hiatal hernia.  4.  Coronary artery disease.  5.  COPD.   MEDICATIONS:  Per admission H&P include:  1.  Boniva.  2.  Toprol.  3.  Aspirin.  4.  Cymbalta.  5.   Propoxyphene.  6.  Mobic.  7.  Folgard.  8.  Aciphex.  9.  Singulair.  10. Coenzyme Q10.  11. Vitamin B1.  12. Vitamin D.  13. Magnesium.  14. Calcium.  15. Lidoderm.  16. Albuterol.  17. Mucinex.   ALLERGIES:  Per admission H&P include XANAX, MYCINS, LUNESTA, NEXIUM,  CECLOR, PYRIDIUM, CLARITIN, ALLEGRA, IODINE.   SOCIAL HISTORY:  Positive for tobacco. No alcohol. No drugs.   FAMILY HISTORY:  Noncontributory.   PAST MEDICAL HISTORY:  Per admission H&P.   REVIEW OF SYSTEMS:  As stated in the HPI.   PHYSICAL EXAMINATION:  VITAL SIGNS:  Temperature 98.6, BP 112/64, pulse 95,  respiratory rate 18.  HEENT:  Within normal limits.  CHEST:  Crackles bibasilar.  CARDIOVASCULAR:  Regular. S1 and S2. Occasional ectopic beat.  ABDOMEN:  Soft, nontender.  EXTREMITIES:  No clubbing, cyanosis, or edema. The patient does have a drain  in the left knee area.  NEUROLOGICAL:  Pleasantly confused. Moving all extremities. Occasionally  garbled slurring of speech.   LABORATORY DATA:  ABG as stated in HPI. BMET, CBC for today were within  normal limits.   ASSESSMENT:  1.  Hypoxic hypercarbic respiratory distress. Differential diagnosis      includes baseline chronic obstructive pulmonary disease with respiratory      depression secondary to narcotics. ___________ exclude out PE or other      cause, i.e. congestive heart failure.  2.  Confusion.  3.  Chronic obstructive pulmonary disease. Per report, no home oxygen use.  4.  Hypertension.  5.  Coronary artery disease.  6.  History of depression.  7.  Status post left total knee arthroplasty postoperative day #1.   Recommend patient be transferred to a step-down unit for further monitoring.  Currently, patient is able to maintain good saturations in the low 90s with  oxygen. However, due to the patient's confusion, she constantly removes her  oxygen. Therefore requires a higher level of care to be better monitored.  Agree with ruling  out PE. However, respiratory depression secondary to  narcotics is the most likely cause as the patient's saturations improved  dramatically after receiving Narcan. The patient will have a chest x-ray and  cardiac enzymes and will continue with DVT prophylaxis as outlined by  orthopedics. I will make further recommendations after reviewing the above  studies.      Deirdre Peer. Polite, M.D.  Electronically Signed     RDP/MEDQ  D:  06/30/2005  T:  06/30/2005  Job:  [id number]

## 2011-03-20 NOTE — Consult Note (Signed)
NAMEJACQLYN, [name] NO.:  [id number]   MEDICAL RECORD NO.:  [id number]          PATIENT TYPE:  INP   LOCATION:  0164                         FACILITY:  [facility]   PHYSICIAN:  Deanna Artis. Hickling, M.D.DATE OF BIRTH:  07/07/1946   DATE OF CONSULTATION:  07/03/2005  DATE OF DISCHARGE:                                   CONSULTATION   CHIEF COMPLAINT:  Altered mental status.   HISTORY OF PRESENT CONDITION:  I was asked by Dr. Priscille Kluver to see Laurie Horton, a 65 year old woman admitted with persistent left knee pain  following an injury.  The decision was made to perform a total knee  replacement.  This took place on June 29, 2005.  In the postoperative  period, the patient seemed to be somewhat confused.  She had initial  problems with hypoxia and did not want to keep her mask in place.  Pulmonary  embolus was ruled out.  Other etiologies of hypoventilation were considered,  but no clear cut etiology was discerned.   Patient had a CT scan of the brain which showed multiple infarctions that I  will discuss below.  Concerns are raised about the possibility that she had  an acute stroke.  I was asked to see her to determine the etiology of her  altered mental status and to make recommendations for further workup and  treatment.  An MRI of the brain had been planned today; however, it did not  take place and will be performed tomorrow.  In the interim, as the patient  has received less pain medication and has slept better, her delirium has  markedly improved.   PAST MEDICAL HISTORY:  1.  Depression.  2.  Hypertension.  3.  Hiatal hernia.  4.  Coronary artery disease.  5.  Chronic obstructive pulmonary disease.   PAST SURGICAL HISTORY:  1.  Arthroscopic surgery.  2.  Total knee replacement.   Patient is on multiple medications, including Boniva, Toprol, aspirin,  Cymbalta, propoxyphene, Mobic, Folgard, AcipHex, Singulair, Coenzyme Q10,  vitamin B1, vitamin D,  magnesium, calcium, Lidoderm, albuterol, and Mucinex.   DRUG ALLERGIES:  1.  XANAX.  2.  MYCIN MEDICATIONS.  3.  LUNESTA.  4.  PYRIDIUM.  5.  NEXIUM.  6.  CECLOR.  7.  CLARITIN.  8.  IODINE.   FAMILY HISTORY:  Negative for this medical problem.   SOCIAL HISTORY:  Patient smokes cigarettes.  She does not drink alcohol or  use drugs.   REVIEW OF SYSTEMS:  Negative except as noted above.   PHYSICAL EXAMINATION:  VITAL SIGNS:  Temperature 99.3, blood pressure  180/94, resting pulse 98, respirations 22.  Oxygen saturation 98%.  GENERAL:  This is a pleasant, well-developed and well-nourished woman in no  acute distress.  HEENT:  No infections.  LUNGS:  Clear.  HEART:  No murmurs.  Pulses normal.  No bruits.  EXTREMITIES:  She has had an operation on her left knee.  NEUROLOGIC:  Mini mental status 27/30.  She can name 19 animals in 60  seconds and rambled off task twice and had to be  reoriented.  Though her  memory is good, she rambles and at times is tangential.  Cranial nerves are  round and reactive.  Pupils:  Visual fields full.  Extraocular movements are  full to double and simultaneous stimuli.  Symmetric facial strength.  Midline tongue and uvula.  Air conduction greater than bone conduction.  Motor examination:  Normal strength, tone, and mass.  Good fine motor  movements.  No pronator drift.  I cannot test the left leg.  Sensation is  intact to cold, vibration, stereognosis, proprioception.  Cerebellar:  Good  finger to nose.  Rapid head movements.  Gait was not tested.  Reflexes were  normal in the upper extremities and the right lower extremity.  Toes are  bilaterally flexor.   I neglect to mention that over the past couple of years, the patient has had  episodes of altitudinal visual defects where she cannot see superiorly.  She  has been seen previously at [facility] by Dr. Shireen Quan.  I do not have those records available at this time.  It  is my  understanding that she has had at least a couple of MRI scans, one this  summer.  Apparently showed diffuse white matter disease that seems to be  increasing over time.   IMPRESSION:  1.  Delirium, 293.0, secondary to sleep deprivation and medications.  2.  Patient shows no focal or lateralized deficits.  I do not believe she      has had an acute cerebral infarction.  3.  Patient is rambling but does not have evidence of dementia.  I think      that she has mild cognitive impairment, which may be a precursor.  4.  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  The      etiology of this is unclear.  The risk factors for stroke include      coronary artery disease and hypertension.   I have reviewed the CT scan, and it shows what appears to be an infarction  in the left superior cerebellar region, the left internal limb of the  internal capsule, and superior to that, the frontal white matter, inferior  to that, the putamen, left external capsule, the left parietal subcortical  white matter, and bilaterally in the superficial subcortical white matter,  frontal and parietal lobes.  In addition, there appears to be mild cortical  atrophy.   I agree with the plan to perform an MRI scan.  I will review it.  I would  use sedative and pain medicines with great care, as I suspect that those are  responsible for the patient's delirium.  If you have any questions where I  can be of assistance, do not hesitate to contact me.  I will review the MRI  and comment as needed.      Deanna Artis. Sharene Skeans, M.D.  Electronically Signed     WHH/MEDQ  D:  07/03/2005  T:  07/03/2005  Job:  [id number]   cc:   Jonny Ruiz L. Rendall, M.D.  Fax: [phone]   Deirdre Peer. Polite, M.D.   Shan Levans, M.D. [facility]  [address]  [place]  [place] [address]   Brett Canales A. Cleta Alberts, M.D.  [address]  [place]  [place] [address] Fax: [phone]

## 2011-03-20 NOTE — H&P (Signed)
Laurie Horton, Laurie Horton             ACCOUNT NO.:  [id number]   MEDICAL RECORD NO.:  [id number]          PATIENT TYPE:  INP   LOCATION:  NA                           FACILITY:  [facility]   PHYSICIAN:  John L. Rendall, M.D.  DATE OF BIRTH:  September 23, 1946   DATE OF ADMISSION:  06/29/2005  DATE OF DISCHARGE:                                HISTORY & PHYSICAL   CHIEF COMPLAINT:  Left knee pain.   HISTORY OF PRESENT ILLNESS:  The patient is a 65 year old white female with  a many year history of progressively worsening left knee pain and multiple  arthroscopies.  The patient initially was injured after a fall.  She had two  arthroscopies done.  Patient denies any improvement with any of the above-  mentioned surgical procedures.  Patient is currently having a sharp burning-  type pain with ambulation.  She does have the knee giving way.  She denied  any night pain.  She does have occasional swelling in the knee.  Patient  denies any radiation of the pain up or down the leg.  She has a difficult  time ambulating without the use of assistive device.   ALLERGIES:  1.  XANAX.  2.  MYCINS.  3.  LUNESTA.  4.  NEXIUM.  5.  CECLOR.  6.  PYRAMIDON.  7.  CLARITIN D.  8.  ALLEGRA.  9.  __________.  10. IODINE.  11. Sensitive to multiple medications.   CURRENT MEDICATIONS:  1.  Boniva 1 tablet p.o. monthly.  2.  Toprol XL 12.5 mg p.o. q.a.m. and 25 mg p.o. q.h.s.  3.  Aspirin 81 mg p.o. daily.  4.  Cymbalta 60 mg p.o. b.i.d.  5.  Propoxyphene p.r.n.  6.  Rozerem 16 mg p.o. q.h.s.  7.  Estazolam 4 mg p.o. q.h.s.  8.  Mobic 7.5 mg p.o. daily.  9.  Folgard 1 tablet p.o. b.i.d.  10. Aciphex 20 mg p.o. b.i.d.  11. Singulair 10 mg p.o. q.h.s.  12. CoQ10 50 mg p.o. t.i.d.  13. Vitamin B1 100 mg p.o. at dinner.  14. Vitamin D 400 mg p.o. b.i.d.  15. Manganese 50 mg p.o. daily.  16. Magnesium malate 1250 mg p.o. t.i.d.  17. Calcium 500 mg p.o. t.i.d.  18. Lidoderm patches 3 patches q.12h. on either  the back, shoulders, arms or      legs.  19. Albuterol inhaler MDI 2 puffs q.a.m. and p.r.n.  20. Mucinex p.r.n.   PAST MEDICAL HISTORY:  1.  Major depression.  2.  Hypertension.  3.  Hiatal hernia.  4.  Reflux disease.  5.  Coronary artery disease.  6.  Carotid artery disease.  7.  Asthma and chronic obstructive pulmonary disease.  8.  Acid reflux.  9.  History of diverticulitis.  10. History of medication-related tremors.   PAST SURGICAL HISTORY:  1.  Left knee arthroscopy x 2.  2.  Hysterectomy in 1987.  3.  Patient states that she did have apneic events having to rely on a      ventilator after the hysterectomy, possibly a Betadine reaction.  SOCIAL HISTORY:  Patient is a 65 year old female.  She does smoke about a  pack and a half a day for the last 20 years.  She denies any alcohol.  She  is divorced.  She has two grown children.  She lives by herself in a two-  story house.  She is disability retired.   PHYSICIANS:  Family physician, Dr. Cleta Alberts at [phone].  Cardiologist, Dr. [place].  Gastroenterologist, Dr. Randa Evens.  Psychiatrist, Dr. Betti Cruz.   FAMILY MEDICAL HISTORY:  Mother is deceased at age of 83 with complications  related dementia and diabetes.  Father is deceased at the age of 65 from a  heart attack.  Patient has one brother deceased at the age of 48 from heart  disease.  Three sisters alive, one with paranoid schizophrenia, the second  with significant tremors, and the third with diabetes.   REVIEW OF SYSTEMS:  Positive for chronic cough related to her COPD and  asthma.  She does have some shortness of breath related to her COPD and  asthma.  She does wear glasses at all times.  She does have issues with her  reflux, but is fairly well controlled with her current medications.  Issues  with constipation and increased urinary frequency.  Patient also has  significant positive review of systems complaints related to fibromyalgia.   PHYSICAL EXAMINATION:   VITAL SIGNS:  Height 5 feet 4 inches, weight 160  pounds, blood pressure 130/70, pulse 68 and regular with an occasional  skipped beat, respirations 12, patient is afebrile.  GENERAL:  This is a healthy-appearing, well-developed 65 year old white  female who ambulates fairly easily, gets on and off the exam table easily.  HEENT:  Head is normocephalic.  Pupils are equal, round and reactive.  Extraocular movements are intact.  External ears intact.  Gross hearing was  intact.  NECK:  Supple, no palpable lymphadenopathy.  She had good range of motion  with the exception of rotation to the right which she says is a muscle pain  related to her fibromyalgia.  CHEST:  Lung sounds were clear and equal bilaterally, no wheezes, rales or  rhonchi.  HEART:  Regular rate and rhythm with an occasional skipped beat, no murmurs,  rubs or gallops.  ABDOMEN:  Soft and nontender.  Bowel sounds normoactive throughout.  EXTREMITIES:  Upper extremities were symmetrical size and shape.  She had  refused elevation of her shoulders above shoulder height related to muscle  pain related to her fibromyalgia.  She had 5/5 muscle strength in her  biceps, good range of motion of her elbows and wrists.  Lower extremities,  right and left hips had full extension, flexion up to 120 degrees with 20  degrees internal/external rotation without discomfort in her hips.  Her left  knee was normal-appearing, no signs of erythema or ecchymosis, no effusion.  She was generally sore throughout.  She was able to fully extend.  She was  only able to it back to about 110 degrees.  She says it is limited by pain.  There was no instability about the knee.  She had very fine crepitus on the  patella.  She had a very sore calf but no palpable cords, no heat and no  swelling.  Her right knee was without any signs of erythema or ecchymosis, no effusions.  She had just soreness throughout.  She had fine crepitus on  the patella.  She had  full extension.  She was able to flex it  back to 120  degrees, no instability.  The calf was nontender.  PERIPHERAL VASCULAR:  Carotid pulses were 2+, no bruits.  Radial pulses were  2+.  Dorsalis pedis pulses were 1+.  She had no lower extremity edema or  venous stasis changes.  NEUROLOGIC:  Patient was conscious, alert and appropriate.  She did continue  with excessive verbalization of all questions and her medical history.  She  had no other gross neurologic defects noted.  She did have some upper  extremity tremors in her hands which were constant.  BREAST:  Deferred at this time.  RECTAL:  Deferred at this time.  GENITOURINARY:  Deferred at this time.   IMPRESSION:  1.  Left knee pain.  2.  History of major depression.  3.  History of fibromyalgia.  4.  Hypertension.  5.  History of hiatal hernia.  6.  History of coronary artery disease with carotid artery disease.  7.  Asthma/chronic obstructive pulmonary disease.  8.  Reflux disease.  9.  History of diverticulitis.  10. History of osteoporosis with vertebral fractures.   PLAN:  The patient has been evaluated by her primary care physician and  cleared for this upcoming surgery, as well as cleared by her psychiatrist.  The patient will undergo all of the routine labs and tests prior to having  her left total knee arthroplasty done by Dr. Priscille Kluver with computer  navigation on June 29, 2005.      Jamelle Rushing, P.A.      John L. Rendall, M.D.  Electronically Signed    RWK/MEDQ  D:  06/23/2005  T:  06/23/2005  Job:  [id number]

## 2011-03-20 NOTE — Discharge Summary (Signed)
Laurie Horton, Laurie Horton             ACCOUNT NO.:  [id number]   MEDICAL RECORD NO.:  [id number]          PATIENT TYPE:  INP   LOCATION:  1518                         FACILITY:  [facility]   PHYSICIAN:  John L. Rendall, M.D.  DATE OF BIRTH:  [birth year]   DATE OF ADMISSION:  06/29/2005  DATE OF DISCHARGE:  07/09/2005                                 DISCHARGE SUMMARY   ADMISSION DIAGNOSES:  1.  End-stage osteoarthritis, left knee.  2.  Hypertension.  3.  Asthma/chronic obstructive pulmonary disease.  4.  Gastroesophageal reflux disease.  5.  Fibromyalgia.  6.  History of major depression.  7.  History of hiatal hernia.  8.  Coronary artery disease with history of carotid artery disease.  9.  History of diverticulitis.  10. History of osteoporosis with vertebral fractures.   DISCHARGE DIAGNOSES:  1.  End-stage osteoarthritis, left knee, status post left total knee      arthroplasty.  2.  Acute blood loss anemia secondary to surgery.  3.  Hypoxia, questionably due to medications.  4.  Possible pulmonary emboli, on 6 months' Coumadin therapy.  5.  Postoperative delirium; please note that the delirium is now resolved.  6.  Post cerebral artery non-hemorrhagic stroke.  7.  Hypertension.  8.  Asthma/chronic obstructive pulmonary disease.  9.  Gastroesophageal reflux disease.  10. Fibromyalgia.  11. Coronary artery disease with history of carotid artery disease.  12. History of major depression.  13. History of hiatal hernia.  14. History of diverticulitis.  15. History of osteoporosis with vertebral fractures.   SURGICAL PROCEDURE:  On June 29, 2005, Ms. Yahnke underwent a left total  knee replacement by Dr. Jonny Ruiz L. Rendall, assisted by Richardean Canal, P.A.-C.  This was with computer navigation. She had an LCS Complete RP insert size  standard 17.5 mm thickness placed with an LCS Complete primary femoral  component cemented size standard left and a DePuy MBT keeled tibial tray  cemented size 3. An LCS Complete metal-backed patella cemented size  standard.   COMPLICATIONS:  None.   CONSULTATIONS:  1.  Case management consult June 29, 2005.  2.  Physical therapy and [facility] consults June 30, 2005.  3.  Critical care medicine consult by Dr. Delford Field on July 01, 2005.  4.  Neurology consult by Dr. Sharene Skeans July 03, 2005.  5.  Rehab medicine consult July 06, 2005.   HISTORY OF PRESENT ILLNESS:  This 65 year old white female patient presented  to Dr. Priscille Kluver with a long history of progressively worsening left knee  pain. She has a history of multiple arthroscopies in the past and her  original problem stemmed from an injury after a fall. She has had no  improvement of her pain with the arthroscopies, now complaining of sharp-  burning pain with ambulation. The knee does give way. She also complains of  some swelling about the knee. She has failed conservative treatment and  because of that she is presenting for a left knee replacement.   HOSPITAL COURSE:  Ms. Mirkin tolerated the surgical procedure well without  immediate postoperative complications. She was  transferred to [facility]. On  postoperative day #1, she was a little sedated. She was also hypoxic.  Hemoglobin 11.6, hematocrit 32.8. She was started on therapy per protocol.  ABGs were checked. PCA and Percocet were discontinued and she was switched  to Vicodin for pain; this was monitored. An internal medicine consult was  also obtained at that time and they wanted to rule out PE and that workup  was obtained.   On postoperative day #2, V/Q scan showed no definite PE. She still remained  a bit hypoxic. She was also confused at that time, heart rate 120 to 130.  Hemoglobin 10.7 and hematocrit 30.8. A CT scan showed an old left basal  ganglia and lacunar infarct over prominent perivascular space and some small-  vessel changes. Medicines were used very cautiously. Later that day a   critical care medical consult was obtained due to her continued confusion  and hypoxia. They felt she needed treatment for possible PE, full-dose  Lovenox was started. She was also ordered a Doppler study to rule out DVT  and IV steroids. Respiratory status was aggressively treated with nebulizers  and steroids, and they monitored her for several days in the unit.   Over the next several days her hypoxia slowly improved. Confusion also  slowly improved. It was suspected that she had a pulmonary emboli, but we  could not do a CT scan due to her allergy to contrast. The critical care  medicine recommends treating her for 6 months with anticoagulation due to  probable PE. Her respiratory status did improve slowly. She also received 2  units of packed rbc's due to anemia with a hemoglobin of 8.7 on August 31;  she tolerated that well. Doppler study done on the 31st was negative for  DVT, superficial thrombus or Baker's cyst. Oxygen was slowly able to be  weaned.   On September 1, due to continued confusion, a neurology consult was obtained  by Dr. Sharene Skeans. He followed her throughout the rest of the hospitalization.  He felt at that time some of her confusion was due to sleep deprivation. An  MRI was recommended and ordered to be done to rule out stroke. In regards to  her knee, at this time her knee was doing very well with no signs of  complications.   She was actually able to be transferred back down to [facility] from the unit by  September 4 or 5. MRI was ordered of the brain and that was done on  September 5. Doppler study was also done on September 5, and that showed no  evidence of significant internal carotid artery stenosis, right was not  insonated and left vertebral artery flow was antegrade. MRI scan on the 5th,  however, did show a subacute stroke in the left occipital area and smaller cortical parieto-occipital stroke which did seem to explain most of her  symptoms. Dr. Sharene Skeans  has made recommendations for further workup after her  discharge. She was started on Coumadin for treatment of the PE to eventually  get rid of the Lovenox and she was continued on therapy.   At this time, July 09, 2005, she is doing well. Confusion seems to have  completely resolved. She is afebrile and vitals stable. Lungs are clear. O2  saturation is 91-93% on room air. Left knee incision is well approximated  with staples and no drainage. Neurology and medicine both feel that she is  stable for either transfer home with 24-hour  care or SACU/SNF level of  therapy. She is still having difficulty with balance, maintaining  instructions and a lack of visual field on the periphery on the right. It is  felt at this time she is ready for transfer to the subacute unit and she  will be transferred there later today.   DISCHARGE INSTRUCTIONS:   DIET:  She can continue her current hospitalization regular diet.   MEDICATIONS:  1.  Colace 100 mg p.o. b.i.d.  2.  Toprol-XL 12.5 mg p.o. q.a.m.  3.  Toprol-XL 25 mg p.o. nightly.  4.  Cymbalta 60 mg p.o. b.i.d.  5.  Protonix 40 mg p.o. b.i.d.  6.  Singulair 10 mg p.o. nightly.  7.  Vitamin B12 one tablet p.o. q.a.m.  8.  Calcium carbonate 500 mg p.o. t.i.d. with meals.  9.  Atrovent 0.5 mg/2.5 mL nebulizer solution inhaled q.6h.  10. Lovenox 75 mg subcutaneous q.12h., this can be discontinued once her      Coumadin is therapeutic.  11. Seroquel 100 mg p.o. nightly.  12. Ventolin 2.5 mg/3 mL nebulizer solution inhaled q.6h.  13. Prednisone 30 mg p.o. q.a.m. at 10 a.m. from September 6 to September 8,      and then 20 mg p.o. q.a.m. from September 9 to September 11, then      predinsone 10 mg p.o. q.a.m. from September 12 to September 14, and then      discontinue the prednisone.  14. Rozerem 8 mg p.o. nightly.  15. Lidoderm topical 5% patch 1 patch applied q.24h. and then it needs to be      removed after 12 hours.  16. Norvasc 5 mg p.o.  q.a.m.  17. Hydrochlorothiazide 25 mg p.o. q.a.m.  18. Imdur 30 mg p.o. q.a.m.  19. Zocor 40 mg p.o. every 6 p.m.  20. Coumadin dose to be adjusted to keep her INR between 2 and 2.5 and that      is to be given p.o. at 6 p.m. every evening.  21. Aspirin 81 mg p.o. q.a.m.  22. Senokot-S 1 tablet p.o. p.r.n. constipation.  23. Enema of choice, laxative of choice p.r.n. constipation.  24. Skelaxin 400 mg 1-2 tablets p.o. q.6h. p.r.n. for spasms.  25. Reglan 10 mg p.o. q.8h. p.r.n. reflux or nausea.  26. Albuterol multi-dose inhaler 2 puffs inhaled p.r.n. congestion.  27. Guaifenesin 600 mg p.o. b.i.d. p.r.n.  28. Vicodin 1-2 tablets p.o. q.4h. p.r.n. for pain.   ACTIVITY:  She can be out of bed weightbearing as tolerated on the left leg  with the use of a walker. She is to have PT and OT per rehab protocol for  the total knee and also stroke protocol.  WOUND CARE:  Please clean the left knee incision with alcohol daily and  apply a dry dressing. Staples can be removed and Steri-Strips with benzoin  applied on July 13, 2005. Please notify Dr. Priscille Kluver of temperature  greater than 101.5, chills, pain unrelieved by pain medications, or foul-  smelling drainage from the wound.   FOLLOWUP:  1.  She needs to follow up with Dr. Priscille Kluver in our office in approximately 2      weeks and you need to call [phone] for that appointment.  2.  She does need to follow up with Dr. Sharene Skeans and she is to call his      office after discharge at [phone] and they need to set up it looks like      a bubble  study to look for a PFO and also a followup neurologic exam.   LABORATORY DATA:  On August 29, pH on 50% O2 was 7.348 with a PCO2 15.9, PO2  of 63.4, bicarb of 27.3. On the 31st on 1% oxygen a pH was 7.409, PCO2 46.2,  PO2 177, bicarb 28.8.   Hemoglobin ranged from 15.5 with hematocrit of 44.5 on the 23rd to a low of  8.7 and 24.9 on the 31st. After transfusion it increased to 11.5 and 33.5 on   the 1st.   Sodium ranged right about 132 to 134. Potassium ranged from 3.4 on August  30, to 4.5 on the 23rd. Glucose ranged from 97 to a high of 146. Calcium  ranged from a low of 7.7 on the 31st to 9.4 on the 23rd and 8.8 on September  2. Hemoglobin A1c on September 5, was 5.4%. On August 29, CK was 520, CK-MB  10.8. On the 30th CK was 287, CK-MB 5.7. Troponins were within normal  limits.   On September 4, cholesterol was 239, triglycerides 222, HDL 37, ratio was  6.5, VLDL 44, and LDL 158. Urinalysis on August 23, showed trace hemoglobin,  all other studies within normal limits.   Lower extremity Doppler done on August 31, showed no evidence of DVT, SVT or  Baker's cyst bilaterally. Cerebrovascular carotid artery scan done on  September 5, showed evidence of significant internal carotid artery  stenosis, right was not insonated and left vertebral artery flow was  antegrade. MRI of the brain without contrast on September 5, showed an acute  infarct of the left occipital lobe and at the junction of the left  occipitoparietal lobe as described. This is superimposed upon moderate small-  vessel-type changes. CT of the head without contrast on September 3, showed  the infarct more conspicuous than on previous exam, no bleed or other acute  intracranial processes, and nonspecific white matter changes as previously  seen. The V/Q scan done on August 29, showed ventilation/perfusion matches  particularly on the right lung that would be compatible with chronic lung  change. This would be an indeterminate scan, however, pulmonary embolic  disease is thought to be doubtful.      Legrand Pitts Duffy, P.A.      John L. Rendall, M.D.  Electronically Signed    KED/MEDQ  D:  07/09/2005  T:  07/09/2005  Job:  [id number]   cc:   Deanna Artis. Sharene Skeans, M.D.  Fax: [phone]   Stan Head. Cleta Alberts, M.D.  Fax: [phone]

## 2011-03-20 NOTE — Op Note (Signed)
   Laurie Horton, Laurie Horton                         ACCOUNT NO.:  [id number]   MEDICAL RECORD NO.:  [id number]                   PATIENT TYPE:  AMB   LOCATION:  ENDO                                 FACILITY:  [facility]   PHYSICIAN:  James L. Malon Kindle., M.D.          DATE OF BIRTH:  February 16, 1946   DATE OF PROCEDURE:  03/22/2003  DATE OF DISCHARGE:                                 OPERATIVE REPORT   PROCEDURE PERFORMED:  Esophagogastroduodenoscopy.   MEDICATIONS:  Cetacaine spray, fentanyl 60 mcg, Versed 6 mg IV.   ENDOSCOPIST:  Llana Aliment. Randa Evens, M.D.   INDICATIONS FOR PROCEDURE:  Persistent right upper quadrant pain, left upper  quadrant pain, epigastric pain, esophageal reflux symptoms have not really  completely responded to Aciphex.   DESCRIPTION OF PROCEDURE:  The procedure had been explained to the patient  and consent obtained.  With the patient in left lateral decubitus position,  the Olympus scope was inserted and advanced.  We were able to advance down  into the stomach.  The gastric outlet was identified and passed.  The  duodenum including the bulb and second portion were seen well.  The scope  was withdrawn back into the stomach.  The antrum and body of the stomach  were seen well and were normal.  Fundus and cardia were seen well on the  retroflex view and appeared to be normal.  The GE junction was widely  patent.  The distal esophagus was endoscopically normal and possibly  slightly reddened.  The proximal esophagus was normal.  The scope was  withdrawn.  The patient tolerated the procedure well.   ASSESSMENT:  Gastroesophageal reflux disease.   PLAN:  Will continue on Aciphex, give antireflux instructions.  See back in  our office in two to three months and proceed with screening colonoscopy at  this time.                                               James L. Malon Kindle., M.D.    Waldron Session  D:  03/22/2003  T:  03/22/2003  Job:  [id number]   cc:   Brett Canales A. Cleta Alberts, M.D.  [address]  [place]  [place] [address]  Fax: [phone]

## 2011-03-20 NOTE — Op Note (Signed)
[place]  Patient:    Laurie Horton, Laurie Horton                      MRN: [id number] Proc. Date: 03/07/01 Attending:  Jonny Ruiz L. Dorothyann Gibbs, M.D.                           Operative Report  PREOPERATIVE DIAGNOSIS:  Internal derangement with probable torn lateral meniscus, left knee.  POSTOPERATIVE DIAGNOSIS:  Mild osteoarthritis, left knee.  OPERATION PERFORMED:  Arthroscopic chondroplasty, medial femoral condyle and lateral tibial plateau.  SURGEON:  John L. Dorothyann Gibbs, M.D.  ANESTHESIA:  General.  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  It is right on top of the anterior one third of the medial meniscus.  In the lateral compartment, there was chondromalacia near the intercondylar notch with a flap of hyaline cartilage that is irregular.  The lateral meniscus is entirely normal.  The medial meniscus is normal.  ACL, PCL, undersurface of the patella shows minor chondromalacia only on the central ridge over a small area.  The depth of the patellofemoral groove shows small crease in the articular surface there.  It correlates with the area of the central patellar ridge.  DESCRIPTION OF PROCEDURE:  Under general anesthesia, the left leg was prepared with Betadine and draped as a sterile field.  The standard portals were used after Xylocaine with epinephrine injection.  The findings noted above were found.  Using an intra-articular shaver, the medial fat pad fibrosis was resected and a local chondroplasty on the margin of the medial femoral condyle was carried out.  Then using different portals, the lateral tibial plateau was shaved.  Great care was taken to observe the suprapatellar pouch, patellofemoral articulation, medial and lateral recesses and to carefully probe both menisci.  No evidence of previous meniscectomy of  any significance was found.  Following this, the knee was copiously irrigated with saline, infiltrated with Marcaine with morphine with epinephrine and a sterile compressive bandage was applied.  The patient was returned to recovery in good condition. DD:  03/07/01 TD:  03/08/01 Job: [id number]

## 2011-03-20 NOTE — Discharge Summary (Signed)
Laurie Horton, CIARAVINO NO.:  [id number]   MEDICAL RECORD NO.:  [id number]          PATIENT TYPE:  ORB   LOCATION:  4529                         FACILITY:  [facility]   PHYSICIAN:  Ellwood Dense, M.D.   DATE OF BIRTH:  15-May-1946   DATE OF ADMISSION:  07/09/2005  DATE OF DISCHARGE:  07/14/2005                                 DISCHARGE SUMMARY   DISCHARGE DIAGNOSES:  1.  Embolic cerebrovascular accident secondary PFO.  2.  Left total knee replacement.  3.  Hyponatremia, much improved.  4.  History of depression.  5.  Chronic obstructive pulmonary disease.   HISTORY OF PRESENT ILLNESS:  Ms. Sinning is a 65 year old female with a  history of hypertension, left knee pain secondary to OA, and limited  ambulation. She elected to undergo a left total knee replacement on August  28th by Dr. Priscille Kluver. Postoperatively was __________ as tolerated and on  Arixtra for DVT prophylaxis. Her hospital course has been rocky with hypoxia  and confusion requiring transfer to the stepdown unit for a full workup. A  V/Q scan done was negative. As the patient's hypoxia continued, critical  care medicine was consulted and felt that the patient's respiratory distress  was COPD exacerbated by mucous plugging and atelectasis. A full dose of  subcu Lovenox was initiated as V/Q scan was indeterminate. Bilateral lower  extremity Dopplers were done that showed no evidence of DVT. The patient was  also treated with IV Solu-Medrol to help her respiratory status and  currently on steroid taper. CT of head was done on July 29th secondary to  the patient's confusion. This was initially negative. Follow-up CT showed  left subacute infarct. MRI done showed acute infarct left occipital lobe and  at the junction of the left occipital-parietal junction. Neurology was  consulted for input and a cardiac echo showing 55% to 65% EF with trivial  AVR. Carotid Doppler showed no significant ICA stenosis. The  patient's  mental status did improve, however she continued to have memory deficits  with decrease in insight and partial field cut. Neurology felt that the  patient's CVA was embolic from unknown source and recommended Coumadin with  INR 2 to 2.5 and bubble studies to rule out PE in the future. The patient  was currently being normalized physical therapy. She was at minimal assist  for transfers, minimal assist ambulating 200 feet with rolling walker  requiring verbal cues for ADLs. Subacute was consulted for further  therapies.   PAST MEDICAL HISTORY:  1.  COPD.  2.  Hypertension.  3.  GERD.  4.  Depression.  5.  Fibromyalgia.  6.  Diverticulosis.  7.  Left knee scope times two.  8.  Hysterectomy.   ALLERGIES:  XANAX, NEXIUM, MYSOLINE, LUNESTA, CECLOR, PYRIDIUM, CLARITIN,  ALLEGRA, ALLEGRA, LODINE, IODINE, and FLU VACCINE.   FAMILY HISTORY:  Positive for coronary artery disease, dementia,  schizophrenia, and diabetes. The patient is divorced, currently disabled,  and living in a two-level home with four to five steps to enter. She was  independent prior to admission. She smokes one-half pack per day.  Does not  use any alcohol.   HOSPITAL COURSE:  Ms. Anthonella Klausner was admitted to subacute on July 09, 2005, for __________ therapy to consist of PT and OT daily. Post  admission she was maintained on Vicodin and Robaxin for pain control, and  this has been very effective.  She was maintained on a __________ dose  Lovenox as Coumadin subtherapeutic. At the time of discharge the patient's  INR __________ at 1.9. She was discharged with daily protimes to be done  until Coumadin greater than 2.0 and she is to continue on treatment dose  Lovenox until Coumadin on a therapeutic basis.   Labs done post admission revealed sodium of 128, potassium 2.8, chloride 86,  CO2 31, BUN 17, creatinine 0.8, glucose 105. CBC revealed hemoglobin of  13.1, hematocrit 37.9, white count 13.7,  platelet count 337,000.  Leukocytosis was attributed to steroids. The patient has had daily CBCs done  while on Lovenox. Last check of CBC of September 12th showed hemoglobin of  9.8, hematocrit 34.1, white count 8.2, platelet count 371,000. Check of  electrolytes as of September 10th revealed sodium of 134, potassium 3.7,  chloride 90, CO2 34, BUN 10, creatinine 1.0, glucose 97.  The patient  remains on potassium supplement as long as she is on steroids. They are to  be tapered over the next two weeks. She was noted to have E. coli UTI and  was treated with Macrodantin for this.   The patient's knee incision was noted to be healing well without any signs  or symptoms of infection. Staples were discontinued and __________ at the  time of discharge. During her stay in subacute the patient has progressed  along well. She is currently at modified independent level for ADLs,  modified independent for transfers, modified independent for ambulating 200  feet with a rolling walker. Of  note the patient did have bubble study done  by Dr. [place] prior to discharge on September 12th. TEE revealed moderately  large PFO with shunt demonstrated by Dopplers and positive bubble studies.  Dr. [place]iland has discussed the case with Dr. Sharene Skeans and he and Dr. Sharene Skeans  will decide regarding further treatment as well as length of treatment for  Coumadin.  Dr. Cleta Alberts has graciously agreed to follow the patient's Coumadin  for now.  Daily protimes are being done with the next protime to be done on  September 13th with routine follow-up once INR is therapeutic. The patient  will also continue to receive further follow-up on home health PT and OT  post discharge. On July 14, 2005, the patient is discharge to home.   DISCHARGE MEDICATIONS:  1.  Seroquel 100 mg p.o. daily.  2.  __________  8 mg q.h.s.  3.  __________ 5 mg daily.  4.  Imdur 30 mg daily.  5.  Lipitor 20 mg q.h.s. 6.  Toprol XL 25 mg half in the  a.m. and one in the p.m.  7.  Cymbalta 60 mg b.i.d.  8.  AcipHex 20 mg b.i.d.  9.  Singulair 20 mg daily.  10. __________ one p.o. daily.  11. K-Dur 20 mEq daily through September 17th.  12. Prednisone taper through September 17th.   ACTIVITY LEVEL:  As tolerated with the use of walker.   DIET:  Regular.   WOUND CARE:  To wash the area with soap and water, keep clean and dry.   FOLLOWUP:  The patient is to follow up with Dr. Cleta Alberts for an  appointment in  two weeks, and follow up with Dr. Sharene Skeans for an appointment in one week.  Follow up with Dr. __________ for routine check. Follow up with Dr. Ellwood Dense as needed.      Greg Cutter, P.A.    ______________________________  Ellwood Dense, M.D.    PP/MEDQ  D:  07/15/2005  T:  07/15/2005  Job:  [id number]   cc:   Brett Canales A. Cleta Alberts, M.D.  [address]  [place]  [place] [address]  Fax: [phone]   Deanna Artis. Sharene Skeans, M.D.  Fax: [phone]   Dr. [place]

## 2011-03-20 NOTE — Consult Note (Signed)
Laurie Horton, TROTTER NO.:  [id number]   MEDICAL RECORD NO.:  [id number]          PATIENT TYPE:  INP   LOCATION:  0164                         FACILITY:  [facility]   PHYSICIAN:  Shan Levans, M.D. LHCDATE OF BIRTH:  1946-07-12   DATE OF CONSULTATION:  07/01/2005  DATE OF DISCHARGE:                                   CONSULTATION   CHIEF COMPLAINT:  Hypoxic respiratory failure.   HISTORY OF PRESENT ILLNESS:  This is a 65 year old white female we are asked  to see the evening on the 30th of August by Dr. Sable Feil physician  assistant for evaluation of hypoxic respiratory failure.  This patient has  been in the step-down unit  most of the day with progressive confusion,  agitation, hypoxemia.  Has had a previous ventilation-perfusion scan that is  indeterminate.  Concern of overt pulmonary embolism versus other cause of  hypoxic respiratory failure are raised.  The patient has a preexisting  history of heavy smoking, history of COPD on p.r.n. albuterol and Singulair  at home, actively smoking up until this admission.  She was admitted on  June 29, 2005 for elective left knee surgery for total left knee  replacement.  The patient underwent this procedure on June 29, 2005.  Postoperative, the patient has progressive hypoxemia and delirium, and we  are asked to see this patient for interval care.   PAST MEDICAL HISTORY:  1.  Hypertension.  2.  Reflux disease.  3.  Carotid disease.  4.  COPD.  5.  Asthma.  6.  Depression.  7.  Diverticulitis.  8.  Ongoing tobacco use.   PRIMARY CARE PHYSICIAN:  Brett Canales A. Cleta Alberts, M.D.   She does see Dr. Betti Cruz of psychiatry.   MEDICATIONS PRIOR TO ADMISSION:  __________  1.  Monthly Toprol XL 12.5 mg daily, 25 mg q.p.m.  2.  Aspirin 81 mg daily.  3.  Cymbalta 60 mg b.i.d.  4.  Darvocet p.r.n.  5.  __________ 16 mg q.h.s.  6.  Mobic 7.5 mg daily.  7.  AcipHex 20 mg b.i.d.  8.  Singulair 10 mg daily.  9.  CoQ enzyme 50 mg  t.i.d.  10. Multivitamin supplementation.  11. Lidoderm patch.  12. Calcium.  13. Albuterol p.r.n.  14. Mucinex.   MEDICATION ALLERGIES:  1.  XANAX.  2.  MYCINS.  3.  LUNESTA.  4.  CECLOR.  5.  PRIMIDONE.  6.  CLARITIN-D.  7.  ALLEGRA.  8.  IODINE.  9.  IVP CONTRAST DYE.   PHYSICAL EXAMINATION:  VITAL SIGNS:  Temp 97, blood pressure 130/80, pulse  90, saturation 92% on Venturi Mask.  Respirations 27.  GENERAL:  This is a hypoxic, ill-appearing white female in moderate  distress.  CHEST:  Inspiratory and expiratory wheeze with poor air movement.  HEART:  Regular rate and rhythm.  S3.  Normal S1 and S2.  ABDOMEN:  Soft and nontender.  EXTREMITIES:  No clubbing or edema.  SKIN:  Clear.  NEUROLOGIC:  Intact.  HEENT:  No jugular venous distention, lymphadenopathy.  Oropharynx clear.  NECK:  Supple.  LABORATORY DATA:  Sodium 133, potassium 3.4, chloride 96, CO2 30, BUN 7,  creatinine 0.8, blood sugar 115, calcium 8.4.  Hemoglobin 10.7, white count  8.9, platelet count 164.   Chest x-ray showed atelectatic changes and COPD changes.   IMPRESSION:  Chronic obstructive pulmonary disease exacerbation with mucus  plugging, atelectasis, and ventilation-perfusion mismatch and additionally  shunt, rule out, though, underlying pulmonary emboli, although doubt.   RECOMMENDATIONS:  Full dose of Lovenox.  Check venous Doppler studies of the  lower extremities.  Administer IV Solu-Medrol.  Intensify neb treatments.  Follow up with blood gases, labs, and chest x-ray.  Administer Seroquel for  agitation.  No IV contrast.  Avoid CT scanning due to the fact the patient  has a contrast allergy.  Transfer to ICU status.  See orders.      Shan Levans, M.D. [facility]  Electronically Signed     PW/MEDQ  D:  07/01/2005  T:  07/01/2005  Job:  [id number]   cc:   Jonny Ruiz L. Rendall, M.D.  Fax: [phone]   Stan Head. Cleta Alberts, M.D.  [address]  [place]  [place] [address]  Fax: [phone]

## 2011-03-20 NOTE — Discharge Summary (Signed)
NAMEFINLAY, [name] NO.:  [id number]   MEDICAL RECORD NO.:  [id number]          PATIENT TYPE:  ORB   LOCATION:  4529                         FACILITY:  [facility]   PHYSICIAN:  Ellwood Dense, M.D.   DATE OF BIRTH:  06-03-1946   DATE OF ADMISSION:  07/09/2005  DATE OF DISCHARGE:  07/14/2005                                 DISCHARGE SUMMARY   DISCHARGE DIAGNOSES:  1.   Dictation ended at this point.      Greg Cutter, P.A.    ______________________________  Ellwood Dense, M.D.    PP/MEDQ  D:  07/15/2005  T:  07/16/2005  Job:  [id number]

## 2011-03-20 NOTE — Consult Note (Signed)
NAMEMILLI, WOOLRIDGE NO.:  [id number]   MEDICAL RECORD NO.:  [id number]          PATIENT TYPE:  EMS   LOCATION:  ED                           FACILITY:  [facility]   PHYSICIAN:  Thornton Park. Daphine Deutscher, MD  DATE OF BIRTH:  1946-09-19   DATE OF CONSULTATION:  08/25/2005  DATE OF DISCHARGE:                                   CONSULTATION   CHIEF COMPLAINT:  1.  Palpable mass, recurrent, right upper quadrant.  No prior surgery.  2.  Evidence of gastroesophageal reflux disease.   Laurie Horton is a 65 year old lady seen today on August 25, 2005 with a  history of just recently going home from the hospital after a stroke back in  August.  She apparently was at Dr. Sable Feil office and had some nausea.  She had a palpable mass in the right upper quadrant, which he was concerned  about and referred her for evaluation.  Patient says she has had this for  about three years, and it pops in and out.  It causes, apparently some  muscular spasm.  It is not in an area where we typically see hernias, and it  could be that this is a rib that has a pseudoarthrosis and pops.  It  periodically gets out because she notes it when she bends over.   More importantly, she has recently had a stroke and is to see Dr. Billie Ruddy tomorrow but is in no state to consider any kind of elective  surgery.  There is no evidence of any incarceration or any bowel problem.   Therefore, I gave her a card and asked her to call and make a follow-up  appointment with me in a few weeks, and I can perhaps get some records from  Dr. Randa Evens to see if there is anything that would be helpful surgically.  I  would think that when she can undergo general anesthesia, then laparoscopy  could be performed to look in that upper abdomen to see if there is a hernia  or it is a rib that has broken and pseudoarthrosis may need to be repaired  or fixed.  We will plan return to the office for a follow-up appointment.   IMPRESSION:  No acute surgical problem today.      Thornton Park Daphine Deutscher, MD  Electronically Signed     MBM/MEDQ  D:  08/25/2005  T:  08/25/2005  Job:  [id number]   cc:   Jonny Ruiz L. Rendall, M.D.  Fax: [phone]   Llana Aliment. Malon Kindle., M.D.  Fax: [phone]

## 2011-03-20 NOTE — Op Note (Signed)
Laurie Horton, Laurie Horton             ACCOUNT NO.:  [id number]   MEDICAL RECORD NO.:  [id number]          PATIENT TYPE:  INP   LOCATION:  0001                         FACILITY:  [facility]   PHYSICIAN:  John L. Rendall, M.D.  DATE OF BIRTH:  [birth year]   DATE OF PROCEDURE:  06/29/2005  DATE OF DISCHARGE:                                 OPERATIVE REPORT   PREOPERATIVE DIAGNOSIS:  Osteoarthritis, left knee.   POSTOPERATIVE DIAGNOSIS:  Osteoarthritis, left knee.   SURGICAL PROCEDURE:  Left LCS total knee replacement with computer  navigation assistance.   SURGEON:  John L. Rendall, M.D.   ASSISTANT:  Richardean Canal, P.A.   ANESTHESIA:  General.   PATHOLOGY:  The patient has had chronic knee pain resistant to all  conservative measures and has early degenerative changes with erythema and  erosion of the articular surface at the periphery of the hyaline cartilage  medially and laterally with irregularity of the femoral condyles.   PROCEDURE:  Under general anesthesia, the left leg was prepared with  __________ and draped as a sterile field. Leg was wrapped out with an  Esmarch and a sterile tourniquet was elevated to 350 mm. Midline incision  was made. The patella was everted. Femur was sized closest to standard but  possible standard plus. The preparation is made and debridement for computer  navigation assistance. Two Schanz pins were then placed in the proximal  third tibia medially and distal third femur medially. With these in place,  the femoral head center was determined. The medial and lateral malleoli were  determined and computer mapping of the proximal tibia and distal femur was  carried out.   Once this was completed within 0.5 mm accuracy, attention was turned to  making the computer cuts. The tibial osteotomy was done first taking 8 mm  off the high side. Once this was done, a tensioner was inserted and care was  taken to balance the knee ligaments to within 1.5 mm varus,  whereas upon  insertion of the Schanz pins, the knee had 4 mm varus. Once this was done,  the tensioner was inserted. The gap was estimated to be 12.5 and the  standard components on the femur fits best. The anterior and posterior flare  of the distal femur were then cut within 1 mm of perfect anatomic accuracy,  actually 0.3 mm on internal rotation on the first cut. The distal femoral  cut was then made within 0.3 mm accuracy. Recessing guide is then used.  Lamina spreader was then inserted and remnants of the menisci and cruciates  were resected. A 12.5 spacer block was then inserted at flexion and  extension and it turns out that the 15 mm fit slightly better. The tibia is  then exposed. Center peg hole with fins was inserted. Trial of a #3 with 15  mm bearing and a standard femur was then done and excellent fit and  alignment are obtained, but there is some slight hyperextension of the knee.  It is in just slight varus of about 2 degrees. A 17.5 bearing was then  inserted and there  was more stability with the knee flexed and excellent  stability in extension. The knee does not hyperextend as it did. It was 4  degrees and now it is 1, and the knee is within 1-1/2 degrees of anatomic  axis, being in slight varus. Permanent components were then cemented in  place after osteotomizing the patella and washing out the knee with pulse  irrigation. Once this was completed and cement was hardened, excess was  removed. Care was checked taken to check behind the tibial bearing. The knee  is then washed out with pressure irrigation again. A medium Hemovac  was inserted. Tourniquet is let down at 1 hour 15 minutes. The knee was then  closed with #1 Tycron, 0 Vicryl and skin clips. Operative time approximately  1-1/2 hours. Case was delayed 45 minutes because of noticed broken surgical  scrub glove by the tech setting up the room and care was taken to change  everything for optimal results.            ______________________________  Carlisle Beers. Priscille Kluver, M.D.     Renato Gails  D:  06/29/2005  T:  06/29/2005  Job:  [id number]

## 2011-04-28 ENCOUNTER — Other Ambulatory Visit: Payer: Self-pay | Admitting: Orthopedic Surgery

## 2011-04-28 DIAGNOSIS — M25569 Pain in unspecified knee: Secondary | ICD-10-CM

## 2011-04-29 ENCOUNTER — Ambulatory Visit
Admission: RE | Admit: 2011-04-29 | Discharge: 2011-04-29 | Disposition: A | Discharge status: Home / Self Care | Payer: Medicare Other | Source: Ambulatory Visit | Attending: Orthopedic Surgery | Admitting: Orthopedic Surgery

## 2011-04-29 DIAGNOSIS — M25569 Pain in unspecified knee: Secondary | ICD-10-CM

## 2011-07-09 ENCOUNTER — Encounter: Payer: Self-pay | Admitting: Internal Medicine

## 2011-07-10 ENCOUNTER — Encounter: Payer: Self-pay | Admitting: Internal Medicine

## 2011-07-10 ENCOUNTER — Inpatient Hospital Stay (HOSPITAL_COMMUNITY): Payer: Medicare Other

## 2011-07-10 ENCOUNTER — Inpatient Hospital Stay (HOSPITAL_COMMUNITY)
Admission: AD | Admit: 2011-07-10 | Discharge: 2011-07-14 | DRG: 190 | Disposition: A | Discharge status: Home / Self Care | Payer: Medicare Other | Source: Ambulatory Visit | Attending: Pulmonary Disease | Admitting: Pulmonary Disease

## 2011-07-10 ENCOUNTER — Ambulatory Visit (INDEPENDENT_AMBULATORY_CARE_PROVIDER_SITE_OTHER): Payer: Medicare Other | Admitting: Internal Medicine

## 2011-07-10 VITALS — BP 100/80 | HR 79 | Temp 99.0°F | Ht 64.0 in | Wt 194.6 lb

## 2011-07-10 DIAGNOSIS — R0902 Hypoxemia: Secondary | ICD-10-CM | POA: Diagnosis not present

## 2011-07-10 DIAGNOSIS — J441 Chronic obstructive pulmonary disease with (acute) exacerbation: Secondary | ICD-10-CM

## 2011-07-10 DIAGNOSIS — J189 Pneumonia, unspecified organism: Secondary | ICD-10-CM | POA: Diagnosis present

## 2011-07-10 DIAGNOSIS — F17200 Nicotine dependence, unspecified, uncomplicated: Secondary | ICD-10-CM | POA: Diagnosis present

## 2011-07-10 DIAGNOSIS — Z79899 Other long term (current) drug therapy: Secondary | ICD-10-CM

## 2011-07-10 LAB — BLOOD GAS, ARTERIAL
Drawn by: 347861
O2 Content: 0.2 L/min
Patient temperature: 98.6
TCO2: 24.3 mmol/L (ref 0–100)
pH, Arterial: 7.445 — ABNORMAL HIGH (ref 7.350–7.400)

## 2011-07-10 LAB — DIFFERENTIAL
Eosinophils Absolute: 0.2 10*3/uL (ref 0.0–0.7)
Eosinophils Relative: 3 % (ref 0–5)
Lymphs Abs: 1.1 10*3/uL (ref 0.7–4.0)

## 2011-07-10 LAB — CBC
MCV: 92.5 fL (ref 78.0–100.0)
Platelets: 227 10*3/uL (ref 150–400)
RDW: 12.9 % (ref 11.5–15.5)
WBC: 5.8 10*3/uL (ref 4.0–10.5)

## 2011-07-10 LAB — BASIC METABOLIC PANEL
BUN: 10 mg/dL (ref 6–23)
Creatinine, Ser: 0.87 mg/dL (ref 0.50–1.10)
GFR calc Af Amer: >60 mL/min (ref >60)
GFR calc non Af Amer: >60 mL/min (ref >60)
Potassium: 4.6 mEq/L (ref 3.5–5.1)

## 2011-07-10 MED ORDER — LEVALBUTEROL HCL 0.63 MG/3ML IN NEBU
0.6300 mg | INHALATION_SOLUTION | Freq: Once | RESPIRATORY_TRACT | Status: AC
  Administered 2011-07-10: 0.63 mg via RESPIRATORY_TRACT

## 2011-07-10 MED ORDER — METHYLPREDNISOLONE ACETATE 80 MG/ML IJ SUSP
80.0000 mg | Freq: Once | INTRAMUSCULAR | Status: AC
  Administered 2011-07-10: 80 mg via INTRAMUSCULAR

## 2011-07-10 NOTE — Progress Notes (Signed)
Subjective:    Patient ID: Laurie Horton, female    DOB: [birth year], 65 y.o.   MRN: [id number]  HPI 89 yowf smoker with GOLD 2 COPD, exertional desat with refusal of o2, and LLL pulmonary micronodule since dec 2010   OV8/12/2009: Since lst visit in march 2011, she got admitted ot ICU post elective chole with resp failure and bilateral LL consolidation/atelctasis. Since then has rallied around to being baseline health. Still reports stability in COPD. Compliant with symbicort and singulair. . Using O2 only at night. STill refusing to use it with exertion. No change in exertion. Able to walk mall. HAS QUIT SMOKING in interim. However, c/o new green sinus mucus whenshe gets up early in morning and after naps. Dnies worseing dyspnea, worsening cough, or sputum or orthopnea, or wheezing. Not sure when she will have shoulder surgery due to financial constraints.   acute visit 07/11/10 -- Followed by Dr Marchelle Gearing for COPD, GOLD2, hypoxemia. Doesn't use O2 reliably with exertion (documented desats at pulm rehab). Presents today c/o intermittant chest tightness, mid-sternal CP, more wheezing. More sputum production thick and white. Has had nasal gtt, some URI symptoms. Feels wheezy now, worse when ambulating.   July 28, 2010: Followup visit. C/p wheezing for several weeks. Not resolved since seeing Dr. Delton Coombes. Now past 3 days also new yellow sputum that is mild in amounth. Denies fever, or change in baseline wheeze, cough, fever, sick contact. In terms of COPD, she quit going to [facility] rehab. Apparently they were overbearing and insisted she wear o2 with exertion. She states she is asympptomatic and feels well and is not worried about increased mortality risk, less ysmptoms. She states , " I have lived my life and do not need oxygen. I dont want those tanks or portable systems with me"   January 05, 2011: Acute visit. Known Gold stage 2 copd. Past 3 days, increased nasal congestion, yellow nasal drainage,  malaise, post nasal droip with associated cough. No increase in cough, dyspnea, wheeze from baseline. Feels left nostril is blocked despite saline spray. No fever, chest pain, hemoptysis, nausea, vomit, diarrhea, edema, syncope   INPATIENT ADMIT 07/10/2011: Presented to office acutely. Reports 1 week of progressive dyspnea, cough, sputum. Rates symptoms as severe. Associated central chest pain that is related to cough is present. Also low grade subjective fevers and extreme fatigue. Feels she will die. Also wheezing. Home nebs are not working. Denies hemoptysis, edema, syncope, falls, nausea, vomit and diarrhea. Reportedly compliant with nebs and o2 (but wants [place] to remove the tanks from home because she has concentrator, in past wanted [place] to dc both). Pulse ox today was 86% on room air at rest; worse than baseline  Past medical: no new developments  Sociall: continues to live alone. Only a neighbor is social support. Quit smoking.   Review of Systems  Constitutional: Negative for fever and unexpected weight change.  HENT: Positive for congestion and postnasal drip. Negative for ear pain, nosebleeds, sore throat, rhinorrhea, sneezing, trouble swallowing, dental problem and sinus pressure.   Eyes: Negative for redness and itching.  Respiratory: Positive for cough, chest tightness and shortness of breath. Negative for wheezing.   Cardiovascular: Negative for palpitations and leg swelling.  Gastrointestinal: Negative for nausea and vomiting.  Genitourinary: Negative for dysuria.  Musculoskeletal: Negative for joint swelling.  Skin: Negative for rash.  Neurological: Negative for headaches.  Hematological: Does not bruise/bleed easily.  Psychiatric/Behavioral: Negative for dysphoric mood. The patient is not nervous/anxious.  Objective:   Physical Exam General: obese., looks tired and exhausted but not in distress Head: normocephalic and atraumatic  Eyes: conjunctiva and sclera clear    Ears: TMs intact and clear with normal canals  Nose: deviated septum.  Mouth: no deformity or lesions  Neck: no masses, thyromegaly, or abnormal cervical nodes  Chest Wall: no deformities noted  Lungs: decreased BS bilateral and prolonged exhilation.  Heart: regular rate and rhythm, S1, S2 without murmurs, rubs, gallops, or clicks  Abdomen: bowel sounds positive; abdomen soft and non-tender without masses, or organomegaly  Msk: no deformity or scoliosis noted with normal posture  Pulses: pulses normal  Extremities: no clubbing, cyanosis, edema, or deformity noted  Neurologic: CN II-XII grossly intact with normal reflexes, coordination, muscle strength and tone  Skin: intact without lesions or rashes  Cervical Nodes: no significant adenopathy  Axillary Nodes: no significant adenopathy  Psych: alert and cooperative; normal mood and affect; normal attention span and concentration         Assessment & Plan:

## 2011-07-10 NOTE — Assessment & Plan Note (Signed)
Neb and im depot medrol given in office Post neb pulse ox was in 80s  PLAN -Admit [place] (she refused but after extensive counseling agreed) -Transport by self at her own risk (she refused admission via EMS. She wanted to go home first but after discussing with her she has agreed to drive to ER by self) - Check abg, cxr, stat labs  - IV doxy - IV solumedrol - Full Code  - At dc: need to address social needs, med reconciliation and dc of o2 tanks

## 2011-07-10 NOTE — Patient Instructions (Signed)
Admit [place]

## 2011-07-11 DIAGNOSIS — J189 Pneumonia, unspecified organism: Secondary | ICD-10-CM

## 2011-07-11 LAB — EXPECTORATED SPUTUM ASSESSMENT W GRAM STAIN, RFLX TO RESP C

## 2011-07-12 DIAGNOSIS — J189 Pneumonia, unspecified organism: Secondary | ICD-10-CM

## 2011-07-12 DIAGNOSIS — J441 Chronic obstructive pulmonary disease with (acute) exacerbation: Secondary | ICD-10-CM

## 2011-07-13 ENCOUNTER — Inpatient Hospital Stay (HOSPITAL_COMMUNITY): Payer: Medicare Other

## 2011-07-13 LAB — CULTURE, RESPIRATORY W GRAM STAIN

## 2011-07-14 DIAGNOSIS — J9600 Acute respiratory failure, unspecified whether with hypoxia or hypercapnia: Secondary | ICD-10-CM

## 2011-07-14 DIAGNOSIS — J189 Pneumonia, unspecified organism: Secondary | ICD-10-CM

## 2011-07-14 DIAGNOSIS — J441 Chronic obstructive pulmonary disease with (acute) exacerbation: Secondary | ICD-10-CM

## 2011-07-24 ENCOUNTER — Telehealth: Payer: Self-pay | Admitting: Internal Medicine

## 2011-07-24 ENCOUNTER — Ambulatory Visit (INDEPENDENT_AMBULATORY_CARE_PROVIDER_SITE_OTHER): Payer: Medicare Other | Admitting: Internal Medicine

## 2011-07-24 ENCOUNTER — Encounter: Payer: Self-pay | Admitting: Internal Medicine

## 2011-07-24 DIAGNOSIS — R911 Solitary pulmonary nodule: Secondary | ICD-10-CM

## 2011-07-24 DIAGNOSIS — J984 Other disorders of lung: Secondary | ICD-10-CM

## 2011-07-24 DIAGNOSIS — J189 Pneumonia, unspecified organism: Secondary | ICD-10-CM | POA: Insufficient documentation

## 2011-07-24 DIAGNOSIS — I2510 Atherosclerotic heart disease of native coronary artery without angina pectoris: Secondary | ICD-10-CM

## 2011-07-24 DIAGNOSIS — B370 Candidal stomatitis: Secondary | ICD-10-CM | POA: Insufficient documentation

## 2011-07-24 DIAGNOSIS — J449 Chronic obstructive pulmonary disease, unspecified: Secondary | ICD-10-CM

## 2011-07-24 HISTORY — DX: Pneumonia, unspecified organism: J18.9

## 2011-07-24 MED ORDER — BUDESONIDE 0.25 MG/2ML IN SUSP: 0.2500 mg | Freq: Two times a day (BID) | RESPIRATORY_TRACT | Status: DC

## 2011-07-24 MED ORDER — IPRATROPIUM BROMIDE 0.02 % IN SOLN: 500.0000 ug | Freq: Four times a day (QID) | RESPIRATORY_TRACT | Status: DC

## 2011-07-24 MED ORDER — NYSTATIN 100000 UNIT/ML MT SUSP: 500000.0000 [IU] | Freq: Four times a day (QID) | OROMUCOSAL | Status: DC

## 2011-07-24 NOTE — Progress Notes (Signed)
Subjective:    Patient ID: Laurie Horton, female    DOB: 1946/08/01, 65 y.o.   MRN: [id number]  HPI Problem List 1. Smoker - reportedly quit August 2011 2. GOLD 2 COPD, exertional desat as of Sept 2011 with refusal of o2 3. LLL pulmonary micronodule 0.6cm with subcentimeter mediastinal nodes - dec 2010, aug 2011  4. Admissions for Resp failure/AECOPD   - Post plap ICU resp failure and pneumonia LLL - March 2011, resolved on CT aug 2011 - RML PNA Sept 2012 5. Recurrent AECOPD 6. Poor health literacy, poly pharmacy, lives alone, pscyh issues  Ov 07/24/11: Followup for all of above. Admitted earlier this month for worsening hypoxemia (pulse ox 80s at rest; baseline only with exertion) and AECOPD symptoms. Found to have RML Pna on cxr. Dsicharged back home. Feels at baseline health with class 2-3 dyspnea and mild cough. Saw Dr Cleta Alberts 07/22/11 and CXR (personally reviewed) shows persistence of RML pna. She has been given abx and another pred burst per Dr Cleta Alberts. As always, she is confused about her medications. For example,Of note, she is on nitrates and states that Dr [place] gave it to her 5 years ago. I messaged Dr [place] and he is not sure why patoient is not on nitrates; there is no documentation of CAD.  She is upset that advanced home care did not get her meds in time. She has one neb at home; suspects it is albuterol. I tried to ascertain if she would benefit from increased soical support but she declined it  Past Medical History  Diagnosis Date  . Depression   . GERD (gastroesophageal reflux disease)   . HTN (hypertension)   . Hiatal hernia   . COPD (chronic obstructive pulmonary disease)   . Diverticulitis   . Stroke   . DVT (deep venous thrombosis)   . Obesity      Family History  Problem Relation Age of Onset  . Dementia Mother   . Diabetes Mother   . Heart attack Brother 53  . Schizophrenia Sister   . Diabetes Sister   . Tremor Sister      History   Social History  .  Marital Status: Divorced    Spouse Name: N/A    Number of Children: 2  . Years of Education: N/A   Occupational History  . disability    Social History Main Topics  . Smoking status: Former Games developer  . Smokeless tobacco: Not on file  . Alcohol Use: No  . Drug Use: No  . Sexually Active: Not on file   Other Topics Concern  . Not on file   Social History Narrative  . No narrative on file     Allergies  Allergen Reactions  . Symbicort     bliusters  . Alprazolam     REACTION: stops breathing  . Esomeprazole Magnesium     REACTION: "bouncing off walls"  . Eszopiclone     REACTION: "slept for a week"  . Iodine     REACTION: swelling in throat  . Zolpidem Tartrate     REACTION: "slept for a week"     Outpatient Prescriptions Prior to Visit  Medication Sig Dispense Refill  . amLODipine (NORVASC) 5 MG tablet Take 5 mg by mouth daily.        Marland Kitchen aspirin 325 MG tablet Take 325 mg by mouth daily.        . cyclobenzaprine (FLEXERIL) 10 MG tablet Take 10 mg by mouth 3 (  three) times daily as needed.       . desvenlafaxine (PRISTIQ) 50 MG 24 hr tablet Take 50 mg by mouth daily.        Marland Kitchen HYDROcodone-acetaminophen (VICODIN) 5-500 MG per tablet Take 1 tablet by mouth every 12 (twelve) hours as needed.       . isosorbide dinitrate (ISORDIL) 30 MG tablet Take 30 mg by mouth daily.        Marland Kitchen lidocaine (LIDODERM) 5 % Place 3 patches onto the skin daily. Remove & Discard patch within 12 hours or as directed by MD       . metoprolol succinate (TOPROL-XL) 25 MG 24 hr tablet Take 12.5 mg by mouth daily.        . montelukast (SINGULAIR) 10 MG tablet Take 10 mg by mouth at bedtime.        . traZODone (DESYREL) 100 MG tablet Take 300 mg by mouth at bedtime.        . budesonide-formoterol (SYMBICORT) 80-4.5 MCG/ACT inhaler Inhale 2 puffs into the lungs 2 (two) times daily.       . vitamin B-12 (CYANOCOBALAMIN) 100 MCG tablet Take 50 mcg by mouth daily.        . Acai 500 MG CAPS Take 2 capsules by  mouth daily.        . Ascorbic Acid (VITAMIN C) 1000 MG tablet Take 1,000 mg by mouth daily.        . Calcium Carbonate-Vitamin D (CALCIUM 600+D) 600-200 MG-UNIT TABS Take 1 tablet by mouth daily.       . Coenzyme Q10 10 MG capsule Take 10 mg by mouth daily.        . fish oil-omega-3 fatty acids 1000 MG capsule Take 2 g by mouth daily.        . Glucosamine-Chondroit-Vit C-Mn (GLUCOSAMINE 1500 COMPLEX PO) Take 1 tablet by mouth daily.        . pregabalin (LYRICA) 150 MG capsule Take 150 mg by mouth daily.            Review of Systems  Constitutional: Negative for fever and unexpected weight change.  HENT: Negative for ear pain, nosebleeds, congestion, sore throat, rhinorrhea, sneezing, trouble swallowing, dental problem, postnasal drip and sinus pressure.   Eyes: Negative for redness and itching.  Respiratory: Positive for cough. Negative for chest tightness, shortness of breath and wheezing.   Cardiovascular: Negative for palpitations and leg swelling.  Gastrointestinal: Negative for nausea and vomiting.  Genitourinary: Negative for dysuria.  Musculoskeletal: Negative for joint swelling.  Skin: Negative for rash.  Neurological: Negative for headaches.  Hematological: Does not bruise/bleed easily.  Psychiatric/Behavioral: Negative for dysphoric mood. The patient is not nervous/anxious.        Objective:   Physical Exam  Vitals reviewed. Constitutional: She is oriented to person, place, and time. She appears well-developed and well-nourished. No distress.       obese  HENT:  Head: Normocephalic and atraumatic.  Right Ear: External ear normal.  Left Ear: External ear normal.  Mouth/Throat: Oropharyngeal exudate present.       Mild thrush +  Eyes: Conjunctivae and EOM are normal. Pupils are equal, round, and reactive to light. Right eye exhibits no discharge. Left eye exhibits no discharge. No scleral icterus.  Neck: Normal range of motion. Neck supple. No JVD present. No tracheal  deviation present. No thyromegaly present.  Cardiovascular: Normal rate, regular rhythm, normal heart sounds and intact distal pulses.  Exam reveals no gallop and  no friction rub.   No murmur heard. Pulmonary/Chest: Effort normal. No respiratory distress. She has no wheezes. She has no rales. She exhibits no tenderness.       Prolonged expiration No wheeze barrell chest  Abdominal: Soft. Bowel sounds are normal. She exhibits no distension and no mass. There is no tenderness. There is no rebound and no guarding.  Musculoskeletal: Normal range of motion. She exhibits no edema and no tenderness.  Lymphadenopathy:    She has no cervical adenopathy.  Neurological: She is alert and oriented to person, place, and time. She has normal reflexes. No cranial nerve deficit. She exhibits normal muscle tone. Coordination normal.  Skin: Skin is warm and dry. No rash noted. She is not diaphoretic. No erythema. No pallor.  Psychiatric: She has a normal mood and affect. Her behavior is normal. Judgment and thought content normal.          Assessment & Plan:

## 2011-07-24 NOTE — Telephone Encounter (Signed)
Hi Theron Arista  This lady tells me she does not have CAD but she says she is on nitrates for 5 years and that she is yoru patient. Could you please re-evaluate her.   Thanks  MR

## 2011-07-24 NOTE — Patient Instructions (Addendum)
#  Pulmonary nodule - have repeat CT chest wihtout contrast in 3-4 weeks #RML pneumonia  - this is still present on cxr 07/22/11  - we will reassess this on CT chest in 1 month #COPD  - stop symbicort - nurse will add it to allergy list as blisters  - take ventolin nebulizer as needed  - start ipratropium generic nebs 4 times daily as scheduled  - start pulmicort nebs 0.25mg /ML 2 times daily scheduld - continue singulair - respect your decision not to have flu shot - wear oxygen all 24 hours #Thrush in mouth  - take nystatin swish and swallow 5 times daily x 5 days #HEart issue - I do not understand why you are in nitrate  - Please see Dr Peter [place] about this; I will send him a note  #Followup  - return to see Tammy the nurse in 3-4 weeks with CT chest  And full PFT breathing test - bring all your medicines for next visit for med calendar  -return sooner if worse

## 2011-07-24 NOTE — Assessment & Plan Note (Signed)
First noted at admit 07/10/11. Persists on cxr 07/22/11. Need to get Ct in 1 month to ensure clearance.

## 2011-07-24 NOTE — Assessment & Plan Note (Addendum)
Currently stable though hypoxemia has worsened compared to a year ago and this might be due to the RML infiltrate. She is intolerant to spiriva and now having bloisters/thrush with symbicort. So, wil change her to all nebs. Offered flu shot but she refused. Refused rehab too. Did not want increased social support at home. Will do spiro/pft at fu to assess stage worsening or not of copd

## 2011-07-24 NOTE — Telephone Encounter (Signed)
I have a consult note from the hospital when I saw her in June of 2011. I stated at that time that she had no history of CAD. I dont know what prior testing she has had (no records in [place]). I dont know why she is on nitrates. Theron Arista [place]

## 2011-07-24 NOTE — Assessment & Plan Note (Signed)
Nystatin swish and swallow  X 5 days

## 2011-07-24 NOTE — Assessment & Plan Note (Signed)
Last seen 6mm in aug 2011. Needs fu Ct chest in Oct 2012 (tackle RML pna infiltrate at same time as well)

## 2011-07-24 NOTE — Telephone Encounter (Signed)
Gosh. She has pscyh hx. Very difficult to communicate. Polypharmacy. Poor health literacy. If you could see her in office that would be great. Thanks, MR

## 2011-07-24 NOTE — Assessment & Plan Note (Signed)
Not sure if she really has CAD. She is on nitrates for 5 years. I have asked Dr Sw[place]iland to evaluate her

## 2011-07-24 NOTE — Telephone Encounter (Signed)
PT AWARE OF HOW TO USE NEB MEDS ALSO AWARE ALBUTEROL IS AS NEEDED ONLY

## 2011-08-12 NOTE — Discharge Summary (Signed)
  NAMELOSSIE, Laurie NO.:  Horton  MEDICAL RECORD NO.:  [id number]  LOCATION:  1612                         FACILITY:  [facility]  PHYSICIAN:  Devra Dopp, MSN, ACNP DATE OF BIRTH:  17-Sep-1946  DATE OF ADMISSION:  07/10/2011 DATE OF DISCHARGE:  07/14/2011                              DISCHARGE SUMMARY   ADMITTING PHYSICIAN:  Dr. Sandrea Hughs, Pulmonary Division, [facility].  DISCHARGE DIAGNOSES: 1. Acute exacerbation of chronic obstructive pulmonary disease with     questionable right perihilar pneumonia. 2. O2 dependence chronic obstructive pulmonary disease.  HISTORY OF PRESENT ILLNESS:  Laurie Horton is a 65 year old female patient of Laurie Horton, who is O2 dependent, but refuses to wear O2. She presented with acute exacerbation of COPD with low saturations along with coughing and respiratory distress.  She was admitted for further evaluation and treatment.  LAB DATA: 1. Chest x-ray showed right middle lobe airspace disease, coronary     pneumonia, and interval increase in atelectasis. 2. His post-treatment radiographs recommended document resolution.  FURTHER LABORATORY DATA:  PH 7.4, pCO2 of 45, pO2 is 100, WBC is 5.8, hemoglobin 13.8, hematocrit 39.7, and platelets are 227.  HOSPITAL COURSE BY DISCHARGE DIAGNOSIS:  Acute exacerbation of chronic obstructive lung disease with questionable pneumonia.  No organisms specified.  She was admitted for further evaluation and treatment.  She received nebulized bronchodilators along with intravenous antimicrobial therapy.  She reached maximum hospital benefit on July 14, 2011, and was discharged home.  She will be continued on p.o. Levaquin as an outpatient.  She is to follow up with Laurie Horton.  She was to complete the medications as instructed.  DISCHARGE MEDICATIONS:  Consist of, 1. Calcium carbonate 200 mg 1 tablet by mouth daily. 2. Vitamin B12 at 100 mcg daily. 3.  Xopenex 0.63 every 4 hours as needed via nebulizer 4. Levaquin somewhere around 50 mg BiPAP every 24 hours. 5. Prednisone on a taper; 20 mg 2 tablets for 3 days, 1 tablet for the     next 2 days, and then stop.  She has Acai     Berry over-the-counter 2 capsules by mouth daily.  She has a     followup appoint with Laurie Horton.  She should be on a     heart-healthy diet.  DISPOSITION/CONDITION ON DISCHARGE:  Improved.     Devra Dopp, MSN, ACNP     SM/MEDQ  D:  07/27/2011  T:  07/27/2011  Job:  [id number]  Electronically Signed by Devra Dopp MSN ACNP on 07/30/2011 04:54:09 AM Electronically Signed by Sandrea Hughs MD FCCP on 08/12/2011 04:09:50 PM

## 2011-08-14 ENCOUNTER — Encounter: Payer: Self-pay | Admitting: Cardiology

## 2011-08-20 ENCOUNTER — Encounter: Payer: Self-pay | Admitting: Cardiology

## 2011-08-20 ENCOUNTER — Telehealth: Payer: Self-pay | Admitting: Internal Medicine

## 2011-08-20 ENCOUNTER — Ambulatory Visit (INDEPENDENT_AMBULATORY_CARE_PROVIDER_SITE_OTHER): Payer: Medicare Other | Admitting: Cardiology

## 2011-08-20 VITALS — BP 147/94 | HR 91 | Ht 64.0 in | Wt 193.8 lb

## 2011-08-20 DIAGNOSIS — Z79899 Other long term (current) drug therapy: Secondary | ICD-10-CM

## 2011-08-20 DIAGNOSIS — J449 Chronic obstructive pulmonary disease, unspecified: Secondary | ICD-10-CM

## 2011-08-20 DIAGNOSIS — F1910 Other psychoactive substance abuse, uncomplicated: Secondary | ICD-10-CM

## 2011-08-20 DIAGNOSIS — I471 Supraventricular tachycardia: Secondary | ICD-10-CM

## 2011-08-20 DIAGNOSIS — Q211 Atrial septal defect: Secondary | ICD-10-CM

## 2011-08-20 NOTE — Patient Instructions (Signed)
Stop taking isosorbide.  Continue your other therapy.  I will see you as needed.

## 2011-08-20 NOTE — Telephone Encounter (Signed)
Will forward message to [place].

## 2011-08-21 DIAGNOSIS — Z79899 Other long term (current) drug therapy: Secondary | ICD-10-CM | POA: Insufficient documentation

## 2011-08-21 DIAGNOSIS — I471 Supraventricular tachycardia: Secondary | ICD-10-CM | POA: Insufficient documentation

## 2011-08-21 NOTE — Assessment & Plan Note (Signed)
I see no reason for her being on long-acting nitrates. I reviewed all her hospital admissions and my previous notes. I don't see that I have ever placed her on nitrates and she has no history of coronary disease. I recommended that she stop the medication at this time.

## 2011-08-21 NOTE — Telephone Encounter (Signed)
Form placed in MR look-at. Laurie Horton, CMA

## 2011-08-21 NOTE — Assessment & Plan Note (Signed)
She's had no recurrent stroke or TIA since 2006. She should remain on aspirin therapy.

## 2011-08-21 NOTE — Progress Notes (Signed)
Laurie Horton Date of Birth: [birth year] Medical Record #[id number]  History of Present Illness: Laurie Horton is seen at the request of Dr. Marchelle Gearing today. She has a history of severe COPD. She's had a prior stroke. 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 She also had a stroke after knee surgery in 2006. Evaluation at that time revealed a PFO by TEE. She has been maintained on aspirin therapy. Apparently ever since 2006 she has been on nitrate therapy for unclear reasons. She has no known history of coronary disease. The only prior ischemic evaluation she had included normal nuclear stress test in 2002 and 2006. She has no symptoms of angina. She does have chronic shortness of breath. She is on oxygen therapy.  Current Outpatient Prescriptions on File Prior to Visit  Medication Sig Dispense Refill  . amLODipine (NORVASC) 5 MG tablet Take 5 mg by mouth daily.        Marland Kitchen aspirin 325 MG tablet Take 325 mg by mouth daily.        . cyclobenzaprine (FLEXERIL) 10 MG tablet Take 10 mg by mouth 3 (three) times daily as needed.       . desvenlafaxine (PRISTIQ) 50 MG 24 hr tablet Take 50 mg by mouth daily.        Marland Kitchen HYDROcodone-acetaminophen (VICODIN) 5-500 MG per tablet Take 1 tablet by mouth every 12 (twelve) hours as needed.       Marland Kitchen ipratropium (ATROVENT) 0.02 % nebulizer solution Take 2.5 mLs (500 mcg total) by nebulization 4 (four) times daily.  300 mL  12  . lidocaine (LIDODERM) 5 % Place 3 patches onto the skin daily. Remove & Discard patch within 12 hours or as directed by MD       . metoprolol succinate (TOPROL-XL) 25 MG 24 hr tablet Take 12.5 mg by mouth daily.        . montelukast (SINGULAIR) 10 MG tablet Take 10 mg by mouth at bedtime.        . pregabalin (LYRICA) 75 MG capsule Take 75 mg by mouth daily.        . traZODone (DESYREL) 100 MG tablet Take 300 mg by mouth at bedtime.        . VENTOLIN  HFA 108 (90 BASE) MCG/ACT inhaler Inhale 1 puff into the lungs Every 4 hours as needed.      . nystatin (MYCOSTATIN) 100000 UNIT/ML suspension Take 5 mLs (500,000 Units total) by mouth 4 (four) times daily.  120 mL  0    Allergies  Allergen Reactions  . Symbicort     bliusters  . Alprazolam     REACTION: stops breathing  . Claritin   . Esomeprazole Magnesium     REACTION: "bouncing off walls"  . Eszopiclone     REACTION: "slept for a week"  . Iodine     REACTION: swelling in throat  . Lunesta   . Zolpidem Tartrate     REACTION: "slept for a week"    Past Medical History  Diagnosis Date  . Depression   . GERD (gastroesophageal reflux disease)   . HTN (hypertension)   . Hiatal hernia   . COPD (chronic obstructive pulmonary disease)   . Diverticulitis   . Stroke   . DVT (deep venous thrombosis)   . Obesity   . Hyperlipidemia   . PFO (patent foramen ovale)   . PAT (paroxysmal  atrial tachycardia)     Past Surgical History  Procedure Date  . Total abdominal hysterectomy     post op needed oxygen was told "she gave them a scare"  . Knee arthroscopy 2000    left  . Laparoscopic cholecystectomy 04-16-2010    [place]  . Tubal ligation   . US echocardiography 11/20/2009    EF 55-60%  . Cardiovascular stress test 12/26/2004    EF 74%. NO EVIDENCE OF ISCHEMIA    History  Smoking status  . Former Smoker  Smokeless tobacco  . Not on file    History  Alcohol Use No    Family History  Problem Relation Age of Onset  . Dementia Mother   . Diabetes Mother   . Alzheimer's disease Mother   . Heart attack Brother 67  . Schizophrenia Sister   . Diabetes Sister   . Tremor Sister   . Heart attack Father     Review of Systems: As noted in history of present illness..  All other systems were reviewed and are negative.  Physical Exam: BP 147/94  Pulse 91  Ht 5\' 4"  (1.626 m)  Wt 193 lb 12.8 oz (87.907 kg)  BMI 33.27 kg/m2 She is an obese white female who appears  chronically ill. Her HEENT exam is unremarkable. She has no JVD or bruits. Lungs are clear. Cardiac exam reveals a regular rate and rhythm without gallop, murmur, or click. Abdomen is obese, soft, nontender. She has no edema. Pedal pulses are good. She is alert and oriented x3. Cranial nerves II through XII are intact. LABORATORY DATA: ECG today demonstrates normal sinus rhythm with nonspecific ST-T wave abnormality.  Assessment / Plan:

## 2011-08-21 NOTE — Assessment & Plan Note (Signed)
She had an ectopic atrial tachycardia following cholecystectomy in 2011. She is maintained on a low-dose of beta blocker and is asymptomatic. Her ECG showed today shows normal sinus rhythm.

## 2011-08-24 ENCOUNTER — Ambulatory Visit (INDEPENDENT_AMBULATORY_CARE_PROVIDER_SITE_OTHER)
Admission: RE | Admit: 2011-08-24 | Discharge: 2011-08-24 | Disposition: A | Discharge status: Home / Self Care | Payer: Medicare Other | Source: Ambulatory Visit | Attending: Internal Medicine | Admitting: Internal Medicine

## 2011-08-24 DIAGNOSIS — J984 Other disorders of lung: Secondary | ICD-10-CM

## 2011-08-24 DIAGNOSIS — R911 Solitary pulmonary nodule: Secondary | ICD-10-CM

## 2011-08-27 NOTE — Telephone Encounter (Signed)
Jennifer please advise. Thanks. 

## 2011-08-27 NOTE — Telephone Encounter (Signed)
Pt called back. Needs form mailed asap today unless it has already been mailed. Laurie Horton

## 2011-08-27 NOTE — Telephone Encounter (Signed)
Form mailed and pt aware. Carron Curie, CMA

## 2011-08-27 NOTE — Telephone Encounter (Signed)
MR pt is requesting this form be completed today. Please advise. Carron Curie, CMA

## 2011-08-27 NOTE — Telephone Encounter (Signed)
On your laptop

## 2011-08-28 ENCOUNTER — Ambulatory Visit (INDEPENDENT_AMBULATORY_CARE_PROVIDER_SITE_OTHER): Payer: Medicare Other | Admitting: Internal Medicine

## 2011-08-28 ENCOUNTER — Encounter: Payer: Self-pay | Admitting: Internal Medicine

## 2011-08-28 VITALS — BP 120/86 | HR 92 | Temp 97.8°F | Ht 64.0 in | Wt 196.0 lb

## 2011-08-28 DIAGNOSIS — J984 Other disorders of lung: Secondary | ICD-10-CM

## 2011-08-28 DIAGNOSIS — J449 Chronic obstructive pulmonary disease, unspecified: Secondary | ICD-10-CM

## 2011-08-28 DIAGNOSIS — B370 Candidal stomatitis: Secondary | ICD-10-CM

## 2011-08-28 DIAGNOSIS — J4489 Other specified chronic obstructive pulmonary disease: Secondary | ICD-10-CM

## 2011-08-28 DIAGNOSIS — J189 Pneumonia, unspecified organism: Secondary | ICD-10-CM

## 2011-08-28 LAB — PULMONARY FUNCTION TEST

## 2011-08-28 MED ORDER — PREDNISONE 10 MG PO TABS: ORAL_TABLET | ORAL | Status: DC

## 2011-08-28 MED ORDER — DOXYCYCLINE HYCLATE 100 MG PO TABS: 100.0000 mg | ORAL_TABLET | Freq: Two times a day (BID) | ORAL | Status: AC

## 2011-08-28 NOTE — Assessment & Plan Note (Signed)
resolved 

## 2011-08-28 NOTE — Progress Notes (Signed)
Subjective:    Patient ID: Laurie Horton, female    DOB: 09-Jan-1946, 65 y.o.   MRN: [id number]  HPI Problem List 1. Smoker - reportedly quit August 2011 2. GOLD 2 COPD with small PFO, exertional desat as of Sept 2011 with refusal of o2  - isolated low dlco on PFT Oct 2012 3. LLL pulmonary micronodule 0.6cm with subcentimeter mediastinal nodes  - dec 2010, aug 2011, /oct 2012 - no further followup 4. Admissions for Resp failure/AECOPD   - Post plap ICU resp failure and pneumonia LLL - March 2011, resolved on CT aug 2011 - RML PNA Sept 2012; resolved on CT oct 2012 5. Recurrent AECOPD - oct 2012 (office rx) 6. Poor health literacy, poly pharmacy, lives alone, pscyh issues  Ov 07/24/11: Followup for all of above. Admitted earlier this month for worsening hypoxemia (pulse ox 80s at rest; baseline only with exertion) and AECOPD symptoms. Found to have RML Pna on cxr. Dsicharged back home. Feels at baseline health with class 2-3 dyspnea and mild cough. Saw Dr Cleta Alberts 07/22/11 and CXR (personally reviewed) shows persistence of RML pna. She has been given abx and another pred burst per Dr Cleta Alberts. As always, she is confused about her medications. For example,Of note, she is on nitrates and states that Dr [place] gave it to her 5 years ago. I messaged Dr [place] and he is not sure why patoient is not on nitrates; there is no documentation of CAD.  She is upset that advanced home care did not get her meds in time. She has one neb at home; suspects it is albuterol. I tried to ascertain if she would benefit from increased soical support but she declined it  #Pulmonary nodule  - have repeat CT chest wihtout contrast in 3-4 weeks  #RML pneumonia  - this is still present on cxr 07/22/11  - we will reassess this on CT chest in 1 month  #COPD  - stop symbicort - nurse will add it to allergy list as blisters  - take ventolin nebulizer as needed  - start ipratropium generic nebs 4 times daily as scheduled  - start  pulmicort nebs 0.25mg /ML 2 times daily scheduld  - continue singulair  - respect your decision not to have flu shot  - wear oxygen all 24 hours  #Thrush in mouth  - take nystatin swish and swallow 5 times daily x 5 days  #HEart issue  - I do not understand why you are in nitrate  - Please see Dr Peter [place] about this; I will send him a note  #Followup  - return to see Tammy the nurse in 3-4 weeks with CT chest And full PFT breathing test  - bring all your medicines for next visit for med calendar  -return sooner if worse   OV 08/28/11: Followup for COPD, pulmonary nodule, recent pneumonia, and recent thrush. PFT 08/28/11 - shows isolated low dlco only 43%, Fev1 2.111/100%, ratio 60. CT chest 08/24/11: shows emphysema and coronary atherosclerosis. The pneumonia has cleared. The 2 year nodule has diminished insize to 4mm and no furhter fu needed.  Currently reporingt 1-2 weeks of increased cough, sputum volume and change in color to yellow but no increased wheeze, fever, dyspnea, chills, nausea, vomit.   Past, Social, Family reviewed: She saw Dr Sw[place] a week ago or so and was asked to stop nitrates. IHD ruled out.Of note, she is telling me today that she thinks she will be dead by Oct 04, 2012(in  one year). She has reportedly done a living will - and she is DNAR and DNAI even for short time.      Review of Systems  Constitutional: Negative for fever and unexpected weight change.  HENT: Negative for ear pain, nosebleeds, congestion, sore throat, rhinorrhea, sneezing, trouble swallowing, dental problem, postnasal drip and sinus pressure.   Eyes: Negative for redness and itching.  Respiratory: Positive for cough and shortness of breath. Negative for chest tightness and wheezing.   Cardiovascular: Negative for palpitations and leg swelling.  Gastrointestinal: Negative for nausea and vomiting.  Genitourinary: Negative for dysuria.  Musculoskeletal: Negative for joint swelling.  Skin:  Negative for rash.  Neurological: Negative for headaches.  Hematological: Does not bruise/bleed easily.  Psychiatric/Behavioral: Negative for dysphoric mood. The patient is not nervous/anxious.        Objective:   Physical Exam Vitals reviewed. Constitutional: She is oriented to person, place, and time. She appears well-developed and well-nourished. No distress.       obese  HENT:  Head: Normocephalic and atraumatic.  Right Ear: External ear normal.  Left Ear: External ear normal.  Mouth/Throat: Normal Eyes: Conjunctivae and EOM are normal. Pupils are equal, round, and reactive to light. Right eye exhibits no discharge. Left eye exhibits no discharge. No scleral icterus.  Neck: Normal range of motion. Neck supple. No JVD present. No tracheal deviation present. No thyromegaly present.  Cardiovascular: Normal rate, regular rhythm, normal heart sounds and intact distal pulses.  Exam reveals no gallop and no friction rub.   No murmur heard. Pulmonary/Chest: Effort normal. No respiratory distress. She has no wheezes. She has no rales. She exhibits no tenderness.       Prolonged expiration No wheeze barrell chest  Abdominal: Soft. Bowel sounds are normal. She exhibits no distension and no mass. There is no tenderness. There is no rebound and no guarding.  Musculoskeletal: Normal range of motion. She exhibits no edema and no tenderness.  Lymphadenopathy:    She has no cervical adenopathy.  Neurological: She is alert and oriented to person, place, and time. She has normal reflexes. No cranial nerve deficit. She exhibits normal muscle tone. Coordination normal.  Skin: Skin is warm and dry. No rash noted. She is not diaphoretic. No erythema. No pallor.  Psychiatric: She has a normal mood and affect. Her behavior is normal. Judgment and thought content normal.          Assessment & Plan:

## 2011-08-28 NOTE — Patient Instructions (Addendum)
You have mild attack of copd called COPD exacerbation Please take doxycycline 100mg twice daily after meals x 5 days; avoid sunlight Please take prednisone 10mg once daily x 3 days, then 20mg once daily x 3 days, then 10mg once daily x 3 days, then 5mg once dailyx 3 days and stop If worse, go to ER Otherwise, return in 4 mmonths for followup   

## 2011-08-28 NOTE — Assessment & Plan Note (Signed)
First noted at admit 07/10/11. Persists on cxr 07/22/11. Cleared on CT 08/24/11. No further followup

## 2011-08-28 NOTE — Assessment & Plan Note (Signed)
You have mild attack of copd called COPD exacerbation Please take doxycycline 100mg  twice daily after meals x 5 days; avoid sunlight Please take prednisone 10mg  once daily x 3 days, then 20mg  once daily x 3 days, then 10mg  once daily x 3 days, then 5mg  once dailyx 3 days and stop If worse, go to ER Otherwise, return in 4 mmonths for followup

## 2011-08-28 NOTE — Assessment & Plan Note (Signed)
5mm LLL nodule dec 2010. Stable and 4mm in Oct 2012. No further fu

## 2011-08-28 NOTE — Progress Notes (Signed)
PFT done today. 

## 2011-10-12 ENCOUNTER — Encounter: Payer: Self-pay | Admitting: Family Medicine

## 2011-10-12 DIAGNOSIS — R739 Hyperglycemia, unspecified: Secondary | ICD-10-CM | POA: Insufficient documentation

## 2011-10-12 DIAGNOSIS — T50905A Adverse effect of unspecified drugs, medicaments and biological substances, initial encounter: Secondary | ICD-10-CM | POA: Insufficient documentation

## 2011-11-17 ENCOUNTER — Ambulatory Visit (INDEPENDENT_AMBULATORY_CARE_PROVIDER_SITE_OTHER): Payer: Medicare Other | Admitting: Emergency Medicine

## 2011-11-17 DIAGNOSIS — E785 Hyperlipidemia, unspecified: Secondary | ICD-10-CM

## 2011-11-17 DIAGNOSIS — E119 Type 2 diabetes mellitus without complications: Secondary | ICD-10-CM

## 2011-11-17 DIAGNOSIS — Z23 Encounter for immunization: Secondary | ICD-10-CM

## 2011-11-17 DIAGNOSIS — J449 Chronic obstructive pulmonary disease, unspecified: Secondary | ICD-10-CM

## 2011-12-22 ENCOUNTER — Telehealth: Payer: Self-pay

## 2011-12-22 NOTE — Telephone Encounter (Signed)
SPoke with pt and gave mammo results. Pt already had the extra views done and they found 2 places on the left breast. The report should be sent to [place]. She states she has a follow up view in 6 months. They are not going to do anything right now

## 2012-02-09 ENCOUNTER — Encounter: Payer: Self-pay | Admitting: Emergency Medicine

## 2012-03-01 ENCOUNTER — Encounter: Payer: Self-pay | Admitting: Emergency Medicine

## 2012-03-01 ENCOUNTER — Ambulatory Visit (INDEPENDENT_AMBULATORY_CARE_PROVIDER_SITE_OTHER): Payer: Medicare Other | Admitting: Emergency Medicine

## 2012-03-01 ENCOUNTER — Telehealth: Payer: Self-pay | Admitting: Internal Medicine

## 2012-03-01 ENCOUNTER — Ambulatory Visit: Payer: Medicare Other

## 2012-03-01 VITALS — BP 148/98 | HR 68 | Temp 98.1°F | Resp 20 | Ht 63.75 in | Wt 195.4 lb

## 2012-03-01 DIAGNOSIS — R739 Hyperglycemia, unspecified: Secondary | ICD-10-CM

## 2012-03-01 DIAGNOSIS — J189 Pneumonia, unspecified organism: Secondary | ICD-10-CM

## 2012-03-01 DIAGNOSIS — R05 Cough: Secondary | ICD-10-CM

## 2012-03-01 DIAGNOSIS — I10 Essential (primary) hypertension: Secondary | ICD-10-CM

## 2012-03-01 DIAGNOSIS — J441 Chronic obstructive pulmonary disease with (acute) exacerbation: Secondary | ICD-10-CM

## 2012-03-01 DIAGNOSIS — E785 Hyperlipidemia, unspecified: Secondary | ICD-10-CM

## 2012-03-01 DIAGNOSIS — N644 Mastodynia: Secondary | ICD-10-CM

## 2012-03-01 DIAGNOSIS — R7309 Other abnormal glucose: Secondary | ICD-10-CM

## 2012-03-01 LAB — POCT CBC
Granulocyte percent: 62.5 %G (ref 37–80)
HCT, POC: 46.4 % (ref 37.7–47.9)
Hemoglobin: 15.1 g/dL (ref 12.2–16.2)
Lymph, poc: 1.9 (ref 0.6–3.4)
MCHC: 32.5 g/dL (ref 31.8–35.4)
MCV: 96 fL (ref 80–97)
POC LYMPH PERCENT: 28.3 %L (ref 10–50)
RDW, POC: 13.6 %

## 2012-03-01 LAB — COMPREHENSIVE METABOLIC PANEL
ALT: 17 U/L (ref 0–35)
AST: 15 U/L (ref 0–37)
Alkaline Phosphatase: 65 U/L (ref 39–117)
Calcium: 10.1 mg/dL (ref 8.4–10.5)
Chloride: 99 mEq/L (ref 96–112)
Creat: 0.86 mg/dL (ref 0.50–1.10)

## 2012-03-01 LAB — LIPID PANEL
LDL Cholesterol: 206 mg/dL — ABNORMAL HIGH (ref 0–99)
Total CHOL/HDL Ratio: 7.2 Ratio
Triglycerides: 204 mg/dL — ABNORMAL HIGH (ref <150)
VLDL: 41 mg/dL — ABNORMAL HIGH (ref 0–40)

## 2012-03-01 MED ORDER — DOXYCYCLINE HYCLATE 100 MG PO TABS: 100.0000 mg | ORAL_TABLET | Freq: Two times a day (BID) | ORAL | Status: AC

## 2012-03-01 MED ORDER — ALBUTEROL SULFATE HFA 108 (90 BASE) MCG/ACT IN AERS: 2.0000 | INHALATION_SPRAY | RESPIRATORY_TRACT | Status: DC | PRN

## 2012-03-01 NOTE — Progress Notes (Signed)
  Subjective:    Patient ID: Laurie Horton, female    DOB: [birth year], 66 y.o.   MRN: [id number]  HPI patient comes in with multiple complaints today. She's depressed today. She had difficulty getting her Lyrica filled. Had a worsening cough over the past few days she has run out of her albuterol inhaler. She continues to use the Pulmicort and Atrovent per nebulizer. She states she has been coughing up colored phlegm. She does not want to go back to the hospital. She did not want to talk to Dr. Marchelle Gearing because she did not want to be admitted .    Review of Systems     Objective:   Physical Exam  Constitutional: She appears well-developed.  HENT:  Right Ear: External ear normal.  Left Ear: External ear normal.  Neck: No thyromegaly present.  Cardiovascular: Normal rate, regular rhythm and normal heart sounds.  Exam reveals no gallop.   No murmur heard. Pulmonary/Chest: Effort normal. No respiratory distress.       Sounds were diminished in the bases. There were dry rhonchi present throughout both lung fields.  Abdominal: Soft. She exhibits no distension. There is no tenderness.   His breast tenderness over the left lower quadrant of the left breast without a definite mass.  [facility] reading (PRIMARY) by  Dr. Cleta Alberts patient has a 2 x 2  centimeter infiltrate present adjacent to the left hilar area. She has changes of COPD.       Assessment & Plan:  Chest x-ray does appear to me to be an infiltrate in the left perihilar area. She has significant COPD on her chest x-ray. Her pulse ox checked in the office was 88 however she has not used her oxygen any today. She does have oxygen available at home but has not been using it. She also has run out of her albuterol inhaler and needs that refilled. She has multiple drug allergies but has taken doxycycline in the past without difficulty. I did fax him prescriptions for albuterol and 4 doxycycline for her to start a protocol and to Dr. Marchelle Gearing to  give him an update of her status. I also made an appointment to see Dr. Luisa Hart to evaluate her left breast tenderness.

## 2012-03-01 NOTE — Telephone Encounter (Signed)
Please get hold of Dr Cleta Alberts for me this pm

## 2012-03-04 NOTE — Telephone Encounter (Signed)
Please let dR Daub or his office know that I know patient is non compliant, poor med understanding and has refussed o2 consistently. tThis is despite even med calendar visit I believe

## 2012-03-04 NOTE — Telephone Encounter (Signed)
I spoke with Dr. Cleta Alberts and he was just calling to let MR know that he saw pt the other day. She was complaining that her cough was worse and he put her on doxycyline. He states he did not believe pt is taking her medications correctly and she came into the OV w/o her oxygen and was at 87% RA. He states he doesn't believe MR needs to do anything at this time just wanted to let him know. Will forward to MR as an [place]

## 2012-03-04 NOTE — Telephone Encounter (Signed)
Dr. Marchelle Gearing, did you ever talk to Dr. Cleta Alberts on this pt, please advise thanks

## 2012-03-04 NOTE — Telephone Encounter (Signed)
Victorino Dike tried to get hold of him few days ago but he was gone. When I originally picked up the message there was no call back number for him; otherwise I would have called him directly. Please give my cell to him or please get me his cell and I can talk to him

## 2012-03-12 ENCOUNTER — Other Ambulatory Visit: Payer: Self-pay | Admitting: Emergency Medicine

## 2012-03-29 ENCOUNTER — Ambulatory Visit (INDEPENDENT_AMBULATORY_CARE_PROVIDER_SITE_OTHER): Payer: Medicare Other | Admitting: Surgery

## 2012-03-29 ENCOUNTER — Encounter (INDEPENDENT_AMBULATORY_CARE_PROVIDER_SITE_OTHER): Payer: Self-pay | Admitting: Surgery

## 2012-03-29 VITALS — BP 126/78 | HR 76 | Temp 98.2°F | Resp 14 | Ht 64.0 in | Wt 195.6 lb

## 2012-03-29 DIAGNOSIS — N644 Mastodynia: Secondary | ICD-10-CM

## 2012-03-29 NOTE — Progress Notes (Signed)
Patient ID: GAYATHRI FUTRELL, female   DOB: [birth year], 66 y.o.   MRN: [id number]  Chief Complaint  Patient presents with  . New Evaluation    Eval Br. Tenderness    HPI NEESHA LANGTON is a 66 y.o. female.   HPIPatient presents at the request of Dr. Cleta Alberts due to bilateral tender breast. She had a mammogram and ultrasound done in February 2013 which was read as BI-RADS 3. In the left breast there were 2 subcentimeter benign lesions identified. Six-month followup was recommended. She is at sore breasts since that time. Both breasts her. There is no discrete mass. There is no redness or drainage. 2 family history of breast or ovarian cancer. Iced makes it better.  Past Medical History  Diagnosis Date  . Depression   . GERD (gastroesophageal reflux disease)   . HTN (hypertension)   . Hiatal hernia   . COPD (chronic obstructive pulmonary disease)   . Diverticulitis   . Stroke   . DVT (deep venous thrombosis)   . Obesity   . Hyperlipidemia   . PFO (patent foramen ovale)   . PAT (paroxysmal atrial tachycardia)   . Right middle lobe pneumonia 07/24/2011    First noted at admit 07/10/11. Persists on cxr 07/22/11. Cleared on CT 08/24/11. No further followup  . PULMONARY NODULE, LEFT LOWER LOBE 10/14/2009    5mm LLL nodule dec 2010. Stable and 4mm in Oct 2012. No further fu  . TOBACCO ABUSE 06/04/2009  . Osteoporosis   . Fibromyalgia     Past Surgical History  Procedure Date  . Total abdominal hysterectomy     post op needed oxygen was told "she gave them a scare"  . Knee arthroscopy 2000    left  . Laparoscopic cholecystectomy 04-16-2010    Beckey Polkowski  . Tubal ligation   . US echocardiography 11/20/2009    EF 55-60%  . Cardiovascular stress test 12/26/2004    EF 74%. NO EVIDENCE OF ISCHEMIA    Family History  Problem Relation Age of Onset  . Dementia Mother   . Diabetes Mother   . Alzheimer's disease Mother   . Heart attack Brother 73  . Schizophrenia Sister   . Diabetes Sister     . Tremor Sister   . Heart attack Father     Social History History  Substance Use Topics  . Smoking status: Former Smoker    Quit date: 06/02/2010  . Smokeless tobacco: Not on file  . Alcohol Use: No    Allergies  Allergen Reactions  . Budesonide-Formoterol Fumarate     bliusters  . Alprazolam     REACTION: stops breathing  . Avelox (Moxifloxacin Hcl In Nacl)   . Esomeprazole Magnesium     REACTION: "bouncing off walls"  . Esomeprazole Magnesium   . Eszopiclone     REACTION: "slept for a week"  . Eszopiclone   . Iodine     REACTION: swelling in throat  . Loratadine   . Lotrimin (Clotrimazole)   . Pseudoephedrine Hcl Er   . Zolpidem Tartrate     REACTION: "slept for a week"  . Bextra (Valdecoxib) Rash  . Ceclor (Cefaclor) Rash  . Covera-Hs (Verapamil Hcl) Palpitations  . Dicyclomine Hcl Rash  . Tessalon Perles Rash    Current Outpatient Prescriptions  Medication Sig Dispense Refill  . albuterol (VENTOLIN HFA) 108 (90 BASE) MCG/ACT inhaler Inhale 2 puffs into the lungs every 4 (four) hours as needed for wheezing.  8.5 Inhaler  11  . aspirin 325 MG tablet Take 325 mg by mouth daily.        . cyclobenzaprine (FLEXERIL) 10 MG tablet Take 10 mg by mouth 3 (three) times daily as needed.       . desvenlafaxine (PRISTIQ) 50 MG 24 hr tablet Take 50 mg by mouth daily.        Marland Kitchen HYDROcodone-acetaminophen (VICODIN) 5-500 MG per tablet Take 1 tablet by mouth every 6 (six) hours as needed.       Marland Kitchen ipratropium (ATROVENT) 0.02 % nebulizer solution Take 2.5 mLs (500 mcg total) by nebulization 4 (four) times daily.  300 mL  12  . lidocaine (LIDODERM) 5 % Place 3 patches onto the skin daily. Remove & Discard patch within 12 hours or as directed by MD       . metoprolol succinate (TOPROL-XL) 25 MG 24 hr tablet Take 12.5 mg by mouth daily.        . montelukast (SINGULAIR) 10 MG tablet Take 10 mg by mouth at bedtime.        . NORVASC 5 MG tablet TAKE 1 TABLET BY MOUTH EVERY DAY  30  tablet  2  . nystatin (MYCOSTATIN) 100000 UNIT/ML suspension Take 500,000 Units by mouth 4 (four) times daily.        . predniSONE (DELTASONE) 10 MG tablet Take 4 tablets daily for 3 days, then 2 tablets daily for 3 days, then 1 tablet daily for 3 days, then 1/2 tablet daily for 3 days then stop.  24 tablet  0  . pregabalin (LYRICA) 75 MG capsule Take 75 mg by mouth daily.        . traZODone (DESYREL) 100 MG tablet Take 300 mg by mouth at bedtime.        Marland Kitchen DISCONTD: nystatin (MYCOSTATIN) 100000 UNIT/ML suspension Take 5 mLs (500,000 Units total) by mouth 4 (four) times daily.  120 mL  0    Review of Systems Review of Systems  Constitutional: Negative.   HENT: Negative.   Eyes: Negative.   Respiratory: Negative.   Cardiovascular: Negative.   Gastrointestinal: Negative.   Musculoskeletal: Positive for back pain and arthralgias.  Neurological: Negative.   Hematological: Negative.   Psychiatric/Behavioral: Negative.     Blood pressure 126/78, pulse 76, temperature 98.2 F (36.8 C), temperature source Temporal, resp. rate 14, height 5\' 4"  (1.626 m), weight 195 lb 9.6 oz (88.724 kg).  Physical Exam Physical Exam  Constitutional: She appears well-developed and well-nourished.  HENT:  Head: Normocephalic and atraumatic.  Eyes: EOM are normal. Pupils are equal, round, and reactive to light.  Neck: Normal range of motion. Neck supple.  Cardiovascular: Normal rate and regular rhythm.   Pulmonary/Chest: Effort normal and breath sounds normal.       Both breasts examined. Both tender. Both quite dense. No obvious mass. Both nipple normal. No redness. No axillary adenopathy.  Skin: Skin is warm and dry.    Data Reviewed Mammogram and U/S breast 2.2013  [facility]  BIRADS 3   Follow up in august 2013.  Assessment    Bilateral breast pain probably fibrocystic change    Plan    Fibrocystic handout given.  Return in august or September after follow up images done.       Kelia Gibbon  A. 03/29/2012, 10:56 AM

## 2012-03-29 NOTE — Patient Instructions (Signed)
Fibrocystic Breast Changes Fibrocystic breast changes is a non-cancerous(benign) condition that about half of all women have at some time in their life. It is also called benign breast disease and mammary dysplasia. It may also be called fibrocystic breast disease, but it is not really a disease. It is a common condition that occurs when women go through the hormonal changes during their menstrual cycle, between the ages of 20 to 50. Menopausal women do not have this problem, unless they are on hormone therapy. It can affect one or both breasts. This is not a sign that you will later get cancer. CAUSES  Overgrowth of cells lining the milk ducts, or enlarged lobules in the breast, cause the breast duct to become blocked. The duct then fills up with fluid. This is like a small balloon filled with water. It is called a cyst. Over time, with repeated inflammation there is a tendency to form scar tissue. This scar tissue becomes the fibrous part of fibrocystic disease. The exact cause of this happening is not known, but it may be related to the female hormones, estrogen and progesterone. Heredity (genetics) may also be a factor in some cases. SYMPTOMS   Tenderness.   Swelling.   Rope-like feeling.   Lumpy breast, one or both sides.   Changes in the size of the breasts, before and after the menstrual period (larger before, smaller after).   Green or dark brown nipple discharge (not blood).  Symptoms are usually worse before periods (menstrual cycle) and get better toward the end of menstruation. Usually, it is temporary minor discomfort. But some women have severe pain.  DIAGNOSIS  Check your breasts monthly. The best time to check your breasts is after your period. If you check them during your period, you are more likely to feel the normal glands enlarged, as a result of the hormonal changes that happen right before your period. If you do not have menstrual periods, check your breasts the first day of  every month. Become familiar with the way your own breasts feel. It is then easier to notice if there are changes, such as more tenderness, a new growth, change in breast size, or a change in a lump that has always been there. All breasts lumps need to be investigated, to rule out breast cancer. See your caregiver as soon as possible, if you find a lump. Most breast lumps are not cancerous. Excellent treatment is available for ones that are.  To make a diagnosis, your caregiver will examine your breasts and may recommend other tests, such as:  Mammogram (breast X-ray).   Ultrasound.   MRI (magnetic resonance imaging).   Removing fluid from the cyst with a fine needle, under local anesthesia (aspiration).   Taking a breast tissue sample (breast biopsy).  Some questions your caregiver will ask are:  What was the date of your last period?   When did the lump show up?   Is there any discharge from your breast?   Is the breast tender or painful?   Are the symptoms in one or both breasts?   Has the lump changed in size from month-to-month? How long has it been present?   Any family history of breast problems?   Any past breast problems?   Any history of breast surgery?   Are you taking any medications?   When was your last mammogram, and where was it done?  TREATMENT   Dietary changes help to prevent or reduce the symptoms of fibrocystic   breast changes.   You may need to stop consuming all foods that contain caffeine, such as chocolate, sodas, coffee, and tea.   Reducing sugar and fat in your diet may also help.   Decrease estrogen in your diet. Some sources include commercially raised meats which contain estrogen. Eliminate other natural estrogens.   Birth control pills can also make symptoms worse.   Natural progesterone cream, applied at a dose of 15 to 20 milligrams per day, from ovulation until a day or two before your period returns, may help with returning to normal  breast tissue over several months. Seek advice from your caregiver.   Over-the-counter pain pills may help, as recommended by your caregiver.   Danazol hormone (female-like hormone) is sometimes used. It may cause hair growth and acne.   Needle aspiration can be used, to remove fluid from the cyst.   Surgery may be needed, to remove a large, persistent, and tender cyst.   Evening primrose oil may help with the tenderness and pain. It has linolenic acid that women may not have enough of.  HOME CARE INSTRUCTIONS   Examine your breasts after every menstrual period.   If you do not have menstrual periods, examine your breasts the first day of every month.   Wear a firm support bra, especially when exercising.   Decrease or avoid caffeine in your diet.   Decrease the fat and sugar in your diet.   Eat a balanced diet.   Try to see your caregiver after you have a menstrual period.   Before seeing your caregiver, make notes about:   When you have the symptoms.   What types of symptoms you are having.   Medications you are taking.   When and where your last mammogram was taken.   Past breast problems or breast surgery.  SEEK MEDICAL CARE IF:   You have been diagnosed with fibrocystic breast changes, and you develop changes in your breast:   Discharge from the nipple, especially bloody discharge.   Pain in the breast that does not go away after your menstrual period.   New lumps or bumps in the breast.   Lumps in your armpit.   Your breast or breasts become enlarged, red, and painful.   You find an isolated lump, even if it is not tender.   You have questions about this condition that have not been answered.  Document Released: 08/05/2006 Document Revised: 10/08/2011 Document Reviewed: 10/30/2009 [employer] Patient Information 2012 [employer]. 

## 2012-04-04 ENCOUNTER — Ambulatory Visit (INDEPENDENT_AMBULATORY_CARE_PROVIDER_SITE_OTHER): Payer: Medicare Other | Admitting: Family Medicine

## 2012-04-04 ENCOUNTER — Encounter: Payer: Self-pay | Admitting: Family Medicine

## 2012-04-04 ENCOUNTER — Ambulatory Visit: Payer: Medicare Other

## 2012-04-04 VITALS — BP 124/74 | HR 75 | Temp 98.3°F | Resp 16 | Ht 64.0 in | Wt 196.2 lb

## 2012-04-04 DIAGNOSIS — M797 Fibromyalgia: Secondary | ICD-10-CM

## 2012-04-04 DIAGNOSIS — M545 Low back pain, unspecified: Secondary | ICD-10-CM

## 2012-04-04 DIAGNOSIS — M5430 Sciatica, unspecified side: Secondary | ICD-10-CM

## 2012-04-04 DIAGNOSIS — IMO0001 Reserved for inherently not codable concepts without codable children: Secondary | ICD-10-CM

## 2012-04-04 MED ORDER — PREDNISONE 20 MG PO TABS: ORAL_TABLET | ORAL | Status: DC

## 2012-04-04 NOTE — Progress Notes (Signed)
  Subjective:    Patient ID: Laurie Horton, female    DOB: 08-18-1946, 66 y.o.   MRN: [id number]  HPI  Laurie Horton is a 66 y.o. female  LBP  - hx of sciatica in past.  Current sx's - about 4 weeks. NKI  -  Pain in middle of lower back to R buttock and down side of leg  Feels to knee.  Hydrocodone  5/500 - 1 pill twice per day and Lyrica at bedtime chronically for fibromyalgia.  No bowel incontinence, hx of chronic urinary incontinece - followed by urologist. No saddle anesthesia, no lower extremity weakness, hurts to walk at times.  Ortho - Dr. Cleophas Dunker.  Dr. Corliss Skains has told her fibromyalgia now in upper legs.  ?Hx of L5 collapse.   Hx of GERD and hiatal hernia. No known hx PUD.  Weight gain in past with prednisone, but no other intolerances.   Read exercises on WebMD - but pain getting worse.    Review of Systems Per hpi.    Objective:   Physical Exam  Constitutional: She is oriented to person, place, and time. She appears well-developed and well-nourished.  HENT:  Head: Normocephalic and atraumatic.  Cardiovascular: Intact distal pulses.   Pulses:      Dorsalis pedis pulses are 2+ on the right side, and 2+ on the left side.       Capillary refill less than 1 second bilaterally. Feet/toes equal temp on R and L.  Pulmonary/Chest: Effort normal.  Musculoskeletal: She exhibits tenderness. She exhibits no edema.       Lumbar back: She exhibits decreased range of motion, tenderness, bony tenderness and pain. She exhibits no deformity.       Back:  Neurological: She is alert and oriented to person, place, and time. She has normal strength. No sensory deficit. She displays no Babinski's sign on the right side. She displays no Babinski's sign on the left side.  Reflex Scores:      Patellar reflexes are 2+ on the right side and 2+ on the left side.      Achilles reflexes are 2+ on the right side and 2+ on the left side.      Able to heel and toe walk.   Skin: Skin is warm and  dry.  Psychiatric: She has a normal mood and affect. Her behavior is normal.   [facility] reading (PRIMARY) by  Dr. Neva Seat: lower lumbar dextroscoliosis, lower lumbar DDD - with decreased space L3-4, L4-5, and L5-S1.  Marland Kitchen     Assessment & Plan:  Laurie Horton is a 66 y.o. female  LBP - mid to R sided. 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  Complicated by chronic pain with fibromyalgia.  Trial of prednisone 20mg  2x3 days, 1x2 days, then 1/2 qd x 2 days. - SED, RTC precautions given.

## 2012-04-11 ENCOUNTER — Telehealth: Payer: Self-pay

## 2012-04-11 NOTE — Telephone Encounter (Signed)
Dr. Greene, please review.  

## 2012-04-11 NOTE — Telephone Encounter (Signed)
PT STATES SHE WAS DOING BETTER WITH HER BACK ON PREDNISONE BUT ONCE SHE FINISHED MEDS,THE PAIN IS BACK  AS SEVERE AS IT WAS BEFORE TAKING MEDS,  PLEASE ADVISE.  BEST PHONE [phone] UNTIL 3 PM

## 2012-04-12 NOTE — Telephone Encounter (Signed)
Please call patient told her to get in to see Dr. Cleophas Dunker. They have a back specialist associated with the practice that hopefully would be able to help with her back pain

## 2012-04-12 NOTE — Telephone Encounter (Signed)
[name] to see what to do next. Her back pain (running down R leg) is not as bad as bf Pred, but it is starting again not that she has finished the pred on Sunday. Pt normally sees Dr Cleophas Dunker for ortho, but wondered if she needs to see a neurosurgeon instead, or have MRI?  Should she have another round of Pred, or something else? Pt reports she is already on pain meds for her fibromy. Dr Cleta Alberts, pt states you are her PCP and wanted you to address this since Dr Neva Seat is not back for a couple of days.

## 2012-04-13 ENCOUNTER — Encounter: Payer: Self-pay | Admitting: Emergency Medicine

## 2012-04-13 ENCOUNTER — Telehealth: Payer: Self-pay

## 2012-04-13 NOTE — Telephone Encounter (Addendum)
PT STATES SHE HAVE AN APPT IN THE MORNING AND NEED TO COME BY AND P/U A DISC ON HER BACK PLEASE CALL [phone]

## 2012-04-13 NOTE — Telephone Encounter (Signed)
Gave pt message from Dr Cleta Alberts and she reluctantly agreed. She said she trusts Dr Cleta Alberts more than she trusts the specialist and they don't do surgery there. Asked pt if she wanted a referral to neurosurgeon and she stated she didn't know yet whether she needed surgery. Explained that Dr Cleta Alberts would be glad to see her, but he feels like the back specialist would be the best qualified to treat her back problem. Pt agreed.

## 2012-04-13 NOTE — Telephone Encounter (Signed)
Spoke with patient. X-rays ready for pickup. Will pick up tomorrow on her way to her doctor appt.

## 2012-04-27 ENCOUNTER — Other Ambulatory Visit: Payer: Self-pay | Admitting: Physician Assistant

## 2012-06-16 ENCOUNTER — Other Ambulatory Visit: Payer: Self-pay | Admitting: Physician Assistant

## 2012-06-19 ENCOUNTER — Ambulatory Visit (HOSPITAL_COMMUNITY)
Admission: RE | Admit: 2012-06-19 | Discharge: 2012-06-19 | Disposition: A | Discharge status: Home / Self Care | Payer: No Typology Code available for payment source | Source: Ambulatory Visit | Attending: Emergency Medicine | Admitting: Emergency Medicine

## 2012-06-19 ENCOUNTER — Ambulatory Visit (INDEPENDENT_AMBULATORY_CARE_PROVIDER_SITE_OTHER): Payer: Medicare Other | Admitting: Emergency Medicine

## 2012-06-19 ENCOUNTER — Ambulatory Visit: Payer: Medicare Other

## 2012-06-19 VITALS — BP 157/90 | HR 83 | Temp 98.2°F | Resp 20 | Ht 64.0 in | Wt 199.0 lb

## 2012-06-19 DIAGNOSIS — M79606 Pain in leg, unspecified: Secondary | ICD-10-CM

## 2012-06-19 DIAGNOSIS — W19XXXA Unspecified fall, initial encounter: Secondary | ICD-10-CM | POA: Insufficient documentation

## 2012-06-19 DIAGNOSIS — S6000XA Contusion of unspecified finger without damage to nail, initial encounter: Secondary | ICD-10-CM

## 2012-06-19 DIAGNOSIS — I998 Other disorder of circulatory system: Secondary | ICD-10-CM | POA: Insufficient documentation

## 2012-06-19 DIAGNOSIS — S0990XA Unspecified injury of head, initial encounter: Secondary | ICD-10-CM | POA: Insufficient documentation

## 2012-06-19 DIAGNOSIS — S99929A Unspecified injury of unspecified foot, initial encounter: Secondary | ICD-10-CM

## 2012-06-19 DIAGNOSIS — G9389 Other specified disorders of brain: Secondary | ICD-10-CM | POA: Insufficient documentation

## 2012-06-19 DIAGNOSIS — R0902 Hypoxemia: Secondary | ICD-10-CM

## 2012-06-19 DIAGNOSIS — S8990XA Unspecified injury of unspecified lower leg, initial encounter: Secondary | ICD-10-CM

## 2012-06-19 NOTE — Progress Notes (Signed)
Subjective:    Patient ID: Laurie Horton, female    DOB: [birth year], 66 y.o.   MRN: [id number]  HPI patient was in her usual state of health until yesterday when she fell striking the back of her head her right and left knee while she was out walking the dog . She has pain over the top of the head and pain over the lateral right lower leg. Her left knee is a little sore where she has had a knee replacement but she has had no bruising or swelling of this area. She is not on Coumadin. She states she hit the back of her head and is extremely sore. She may have had a transient loss of consciousness she is not sure    Review of Systems     Objective:   Physical Exam  Constitutional: She is oriented to person, place, and time. She appears well-developed and well-nourished.  HENT:  Right Ear: External ear normal.  Left Ear: External ear normal.  Eyes: Pupils are equal, round, and reactive to light.  Cardiovascular: Normal rate.   Pulmonary/Chest: Effort normal and breath sounds normal.  Musculoskeletal:       There is significant tenderness over the head of the fibula. She had good range of motion of the knee.  Neurological: She is alert and oriented to person, place, and time. No cranial nerve deficit.       There is exquisite tenderness over the occipital area but no significant hematoma  Psychiatric: She has a normal mood and affect. Her behavior is normal.      Results for orders placed in visit on 03/01/12  POCT CBC      Component Value Range   WBC 6.7  4.6 - 10.2 K/uL   Lymph, poc 1.9  0.6 - 3.4   POC LYMPH PERCENT 28.3  10 - 50 %L   MID (cbc) 0.6  0 - 0.9   POC MID % 9.2  0 - 12 %M   POC Granulocyte 4.2  2 - 6.9   Granulocyte percent 62.5  37 - 80 %G   RBC 4.83  4.04 - 5.48 M/uL   Hemoglobin 15.1  12.2 - 16.2 g/dL   HCT, POC 40.9  81.1 - 47.9 %   MCV 96.0  80 - 97 fL   MCH, POC 31.3 (*) 27 - 31.2 pg   MCHC 32.5  31.8 - 35.4 g/dL   RDW, POC 91.4     Platelet Count, POC  213  142 - 424 K/uL   MPV 11.8  0 - 99.8 fL  LIPID PANEL      Component Value Range   Cholesterol 287 (*) 0 - 200 mg/dL   Triglycerides 782 (*) <150 mg/dL   HDL 40  >95 mg/dL   Total CHOL/HDL Ratio 7.2     VLDL 41 (*) 0 - 40 mg/dL   LDL Cholesterol 621 (*) 0 - 99 mg/dL  COMPREHENSIVE METABOLIC PANEL      Component Value Range   Sodium 140  135 - 145 mEq/L   Potassium 4.6  3.5 - 5.3 mEq/L   Chloride 99  96 - 112 mEq/L   CO2 26  19 - 32 mEq/L   Glucose, Bld 93  70 - 99 mg/dL   BUN 12  6 - 23 mg/dL   Creat 3.08  6.57 - 8.46 mg/dL   Total Bilirubin 0.4  0.3 - 1.2 mg/dL   Alkaline Phosphatase 65  39 -  117 U/L   AST 15  0 - 37 U/L   ALT 17  0 - 35 U/L   Total Protein 6.5  6.0 - 8.3 g/dL   Albumin 4.8  3.5 - 5.2 g/dL   Calcium 16.1  8.4 - 09.6 mg/dL  GLUCOSE, POCT (MANUAL RESULT ENTRY)      Component Value Range   POC Glucose 87    POCT GLYCOSYLATED HEMOGLOBIN (HGB A1C)      Component Value Range   Hemoglobin A1C 5.7    [facility] reading (PRIMARY) by  Dr.Maiah Sinning please comment on the head of the fibula. There is a questionable area on the lateral film but I do not appreciate any fracture on the PA.      Assessment & Plan:  Since we do not have a clear history of what happened yesterday we'll go ahead and CT her head since she did have a closed head injury and questionable loss of consciousness. We will get an opinion regarding her fibula films from the radiologist

## 2012-06-19 NOTE — Patient Instructions (Addendum)
Head Injury, Adult  You have had a head injury that does not appear serious at this time. A concussion is a state of changed mental ability, usually from a blow to the head. You should take clear liquids for the rest of the day and then resume your regular diet. You should not take sedatives or alcoholic beverages for as long as directed by your caregiver after discharge. After injuries such as yours, most problems occur within the first 24 hours.  SYMPTOMS  These minor symptoms may be experienced after discharge:  · Memory difficulties.  · Dizziness.  · Headaches.  · Double vision.  · Hearing difficulties.  · Depression.  · Tiredness.  · Weakness.  · Difficulty with concentration.  If you experience any of these problems, you should not be alarmed. A concussion requires a few days for recovery. Many patients with head injuries frequently experience such symptoms. Usually, these problems disappear without medical care. If symptoms last for more than one day, notify your caregiver. See your caregiver sooner if symptoms are becoming worse rather than better.  HOME CARE INSTRUCTIONS   · During the next 24 hours you must stay with someone who can watch you for the warning signs listed below.  Although it is unlikely that serious side effects will occur, you should be aware of signs and symptoms which may necessitate your return to this location. Side effects may occur up to 7 - 10 days following the injury. It is important for you to carefully monitor your condition and contact your caregiver or seek immediate medical attention if there is a change in your condition.  SEEK IMMEDIATE MEDICAL CARE IF:   · There is confusion or drowsiness.  · You can not awaken the injured person.  · There is nausea (feeling sick to your stomach) or continued, forceful vomiting.  · You notice dizziness or unsteadiness which is getting worse, or inability to walk.  · You have convulsions or unconsciousness.  · You experience severe,  persistent headaches not relieved by over-the-counter or prescription medicines for pain. (Do not take aspirin as this impairs clotting abilities). Take other pain medications only as directed.  · You can not use arms or legs normally.  · There is clear or bloody discharge from the nose or ears.  MAKE SURE YOU:   · Understand these instructions.  · Will watch your condition.  · Will get help right away if you are not doing well or get worse.  Document Released: 10/19/2005 Document Revised: 10/08/2011 Document Reviewed: 09/06/2009  [employer]® Patient Information ©2012 [employer].

## 2012-06-24 ENCOUNTER — Encounter: Payer: Self-pay | Admitting: Emergency Medicine

## 2012-06-28 ENCOUNTER — Ambulatory Visit (INDEPENDENT_AMBULATORY_CARE_PROVIDER_SITE_OTHER): Payer: Medicare Other | Admitting: Emergency Medicine

## 2012-06-28 VITALS — BP 130/92 | HR 96 | Temp 98.2°F | Resp 18 | Ht 64.0 in | Wt 198.0 lb

## 2012-06-28 DIAGNOSIS — R0902 Hypoxemia: Secondary | ICD-10-CM

## 2012-06-28 DIAGNOSIS — J449 Chronic obstructive pulmonary disease, unspecified: Secondary | ICD-10-CM

## 2012-06-28 DIAGNOSIS — K1379 Other lesions of oral mucosa: Secondary | ICD-10-CM

## 2012-06-28 DIAGNOSIS — K121 Other forms of stomatitis: Secondary | ICD-10-CM

## 2012-06-28 DIAGNOSIS — R05 Cough: Secondary | ICD-10-CM

## 2012-06-28 MED ORDER — FIRST-DUKES MOUTHWASH MT SUSP: OROMUCOSAL | Status: DC

## 2012-06-28 NOTE — Progress Notes (Signed)
  Subjective:    Patient ID: Laurie Horton, female    DOB: 02/27/1946, 66 y.o.   MRN: [id number]  HPI patient in followup her COPD. She also fall recently and had a CT of the head as well as films of her right leg. These are did not reveal any evidence of fracture. She continues to have a cough which at times is productive or purulent phlegm. She does not use her oxygen much during the day but uses it mostly at night    Review of Systems     Objective:   Physical Exam Laurie Horton looks well today. She's alert and cooperative. Her chest exam reveals poor air exchange but otherwise is unremarkable her cardiac exam is regular rate without murmurs. Her extremities are without edema Their small tenderness over the right shin but no hematoma or other abnormalities noted.       Assessment & Plan:  Patient COPD with hypoxia is stable. Her pulse ox in the office was 91. Films done last visit were okay so I don't feel we need to pursue this. Patient did request a refill on her Magic mouthwash.

## 2012-07-08 ENCOUNTER — Telehealth (INDEPENDENT_AMBULATORY_CARE_PROVIDER_SITE_OTHER): Payer: Self-pay

## 2012-07-08 NOTE — Telephone Encounter (Signed)
I left a message for patient to call me back. I need to rescehdule her appointment she had with [name] on 9/17. She is now scheduled to see him 9/26 @ 4:30pm. She can change that if she cant make it.

## 2012-07-08 NOTE — Telephone Encounter (Signed)
Patient returned my call regarding her appointment being rescheduled. She was going to cancel anyway. I asked if she wanted to move to another day and she said no. She is getting back injections that are taking president right now and she said she will call us later to reschedule.

## 2012-07-19 ENCOUNTER — Ambulatory Visit (INDEPENDENT_AMBULATORY_CARE_PROVIDER_SITE_OTHER): Payer: Medicare Other | Admitting: Surgery

## 2012-07-19 ENCOUNTER — Encounter (INDEPENDENT_AMBULATORY_CARE_PROVIDER_SITE_OTHER): Payer: Self-pay

## 2012-07-20 ENCOUNTER — Telehealth: Payer: Self-pay

## 2012-07-20 NOTE — Telephone Encounter (Signed)
Patient is wondering why she was only given one refill on her norvasc when she normally gets several.  Best [phone]

## 2012-07-20 NOTE — Telephone Encounter (Signed)
Spoke with patient-- didn't understand why she only had 1 refill left on her Metoprolol when she was just here.  Last seen and had labs drawn for bp in April.  Due back in October and advised this was reason no further refills given.  Patient very unhappy with this, but did not want me to send message to Dr. Cleta Alberts to request more refills.  I advised that when she rechecks here next month, the MD can write a longer rx for her then.

## 2012-07-28 ENCOUNTER — Ambulatory Visit (INDEPENDENT_AMBULATORY_CARE_PROVIDER_SITE_OTHER): Payer: Medicare Other | Admitting: Surgery

## 2012-08-01 ENCOUNTER — Ambulatory Visit (INDEPENDENT_AMBULATORY_CARE_PROVIDER_SITE_OTHER): Payer: Medicare Other | Admitting: Internal Medicine

## 2012-08-01 VITALS — BP 130/74 | HR 60 | Temp 98.0°F | Ht 64.0 in | Wt 200.0 lb

## 2012-08-01 DIAGNOSIS — Z23 Encounter for immunization: Secondary | ICD-10-CM

## 2012-08-01 DIAGNOSIS — J441 Chronic obstructive pulmonary disease with (acute) exacerbation: Secondary | ICD-10-CM

## 2012-08-01 DIAGNOSIS — J4489 Other specified chronic obstructive pulmonary disease: Secondary | ICD-10-CM

## 2012-08-01 DIAGNOSIS — J449 Chronic obstructive pulmonary disease, unspecified: Secondary | ICD-10-CM

## 2012-08-01 MED ORDER — CLARITHROMYCIN 250 MG PO TABS: 250.0000 mg | ORAL_TABLET | Freq: Two times a day (BID) | ORAL | Status: DC

## 2012-08-01 NOTE — Progress Notes (Signed)
Subjective:    Patient ID: Laurie Horton, female    DOB: June 20, 1946, 66 y.o.   MRN: [id number]  HPI Problem List 1. Smoker -  quit August 2011 2. COPD   - with small PFO, exertional desat as of Sept 2011   - isolated low dlco on PFT Oct 2012 with emphysema on CT chest:  PFT 08/28/11 - shows isolated low dlco only 43%, Fev1 2.111/100%, ratio 60. - using oxygen 2013 following years of refusal 3. LLL pulmonary micronodule 0.6cm with subcentimeter mediastinal nodes  - dec 2010, aug 2011, oct 2012 - no further followup 4. Admissions for Resp failure/AECOPD   - Post plap ICU resp failure and pneumonia LLL - March 2011, resolved on CT aug 2011 - RML PNA Sept 2012; resolved on CT oct 2012 5. Recurrent AECOPD  - aug/sept 2012 - Dr Cleta Alberts opd Rx - oct 2012 (office rx) 6. Poor health literacy, poly pharmacy, lives alone, pscyh issues 7. Code Status: DNAR, DNAI including no short term ventilation  - Oct 2012 pulm office visit. Thinks she will be dead by Oct 06, 2012   OV 2012/08/07  COPD - mild yellow sputum, increase cough and increased wheeze past  2 weeks. No change in dyspnea or effort tolerance. CAT score 20. Vaccine - pneumovax 2007 prior to age 35. FLu - not had yet. Reluctantly agreed to have both. She is worried she had 'flu' afeter flu shot in 2012-2013 season but I explained advantages and limitations of flu shot and she agreed.   Smoking - still in remission    CAT COPD Symptom & Quality of Life Score (GSK trademark) 0 is no burden. 5 is highest burden August 07, 2012   Never Cough -> Cough all the time 3  No phlegm in chest -> Chest is full of phlegm 3  No chest tightness -> Chest feels very tight 3  No dyspnea for 1 flight stairs/hill -> Very dyspneic for 1 flight of stairs 2  No limitations for ADL at home -> Very limited with ADL at home 1  Confident leaving home -> Not at all confident leaving home 1  Sleep soundly -> Do not sleep soundly because of lung condition 3  Lots of  Energy -> No energy at all 4  TOTAL Score (max 40)  20     Past, Family, Social reviewed: no change since last visit    Review of Systems  Constitutional: Negative for fever and unexpected weight change.  HENT: Negative for ear pain, nosebleeds, congestion, sore throat, rhinorrhea, sneezing, trouble swallowing, dental problem, postnasal drip and sinus pressure.   Eyes: Negative for redness and itching.  Respiratory: Negative for cough, chest tightness, shortness of breath and wheezing.   Cardiovascular: Negative for palpitations and leg swelling.  Gastrointestinal: Negative for nausea and vomiting.  Genitourinary: Negative for dysuria.  Musculoskeletal: Negative for joint swelling.  Skin: Negative for rash.  Neurological: Negative for headaches.  Hematological: Does not bruise/bleed easily.  Psychiatric/Behavioral: Negative for dysphoric mood. The patient is not nervous/anxious.        Objective:   Physical Exam Vitals reviewed. Constitutional: She is oriented to person, place, and time. She appears well-developed and well-nourished. No distress.       obese  HENT:  Head: Normocephalic and atraumatic.  Right Ear: External ear normal.  Left Ear: External ear normal.  Mouth/Throat: Normal Eyes: Conjunctivae and EOM are normal. Pupils are equal, round, and reactive to light. Right eye exhibits no discharge. Left eye  exhibits no discharge. No scleral icterus.  Neck: Normal range of motion. Neck supple. No JVD present. No tracheal deviation present. No thyromegaly present.  Cardiovascular: Normal rate, regular rhythm, normal heart sounds and intact distal pulses.  Exam reveals no gallop and no friction rub.   No murmur heard. Pulmonary/Chest: Effort normal. No respiratory distress. She has no wheezes. She has no rales. She exhibits no tenderness.       Prolonged expiration No wheeze barrell chest  Abdominal: Soft. Bowel sounds are normal. She exhibits no distension and no mass.  There is no tenderness. There is no rebound and no guarding.  Musculoskeletal: Normal range of motion. She exhibits no edema and no tenderness.  Lymphadenopathy:    She has no cervical adenopathy.  Neurological: She is alert and oriented to person, place, and time. She has normal reflexes. No cranial nerve deficit. She exhibits normal muscle tone. Coordination normal.  Skin: Skin is warm and dry. No rash noted. She is not diaphoretic. No erythema. No pallor.  Psychiatric: She has a normal mood and affect. Her behavior is normal. Judgment and thought content normal.           Assessment & Plan:

## 2012-08-01 NOTE — Patient Instructions (Addendum)
#  COPD You have mild attack of copd called COPD exacerbation Please take biaxin per your request No need for prednisone burst at this point. However, if this gets worse you might need prednisone but right now you do not Have flu shot and pneumovax vaccine 08/01/2012 today Continue your medications and oxygen  #Smoking  - glad you are still quit  #Followup  9 months or sooner if needed    

## 2012-08-02 ENCOUNTER — Encounter: Payer: Self-pay | Admitting: Internal Medicine

## 2012-08-02 NOTE — Assessment & Plan Note (Signed)
#  COPD You have mild attack of copd called COPD exacerbation Please take biaxin per your request No need for prednisone burst at this point. However, if this gets worse you might need prednisone but right now you do not Have flu shot and pneumovax vaccine 08/01/2012 today Continue your medications and oxygen  #Smoking  - glad you are still quit  #Followup  9 months or sooner if needed

## 2012-08-21 ENCOUNTER — Ambulatory Visit (INDEPENDENT_AMBULATORY_CARE_PROVIDER_SITE_OTHER): Payer: Medicare Other | Admitting: Emergency Medicine

## 2012-08-21 ENCOUNTER — Ambulatory Visit: Payer: Medicare Other

## 2012-08-21 VITALS — BP 130/82 | HR 96 | Temp 97.6°F | Resp 16 | Wt 199.4 lb

## 2012-08-21 DIAGNOSIS — R0789 Other chest pain: Secondary | ICD-10-CM

## 2012-08-21 DIAGNOSIS — J449 Chronic obstructive pulmonary disease, unspecified: Secondary | ICD-10-CM

## 2012-08-21 DIAGNOSIS — R071 Chest pain on breathing: Secondary | ICD-10-CM

## 2012-08-21 MED ORDER — HYDROCODONE-ACETAMINOPHEN 5-325 MG PO TABS: 1.0000 | ORAL_TABLET | ORAL | Status: AC | PRN

## 2012-08-21 NOTE — Progress Notes (Signed)
[facility] [address], [place] [address] [phone]  Date:  08/21/2012   Name:  Laurie Horton   DOB:  [birth year]   MRN:  [id number]  PCP:  Lucilla Edin, MD    Chief Complaint: Fall and Rib Injury   History of Present Illness:  Laurie Horton is a 66 y.o. very pleasant female patient who presents with the following:  Patient with a long history of COPD and intermittent home oxygen.  She was carrying a load of bedding down the stairs and missed the last step and fell, landing against a door knob.  Struck her in the left anterolateral chest wall.  She put herself on 2 liters/minute of oxygen and did a neb treatment with some resolution of her acutely increased shortness of breath.  She denies any nausea or vomiting, vomiting blood or hemoptysis.  Injury occurred approximately two hours ago and  The pain is worse when she takes a deep breath, bends, sneezes, or coughs.  Worse when she sits down.  Patient Active Problem List  Diagnosis  . DEPRESSION  . HYPERTENSION  . OTHER ACUTE SINUSITIS  . UPPER RESPIRATORY INFECTION, VIRAL  . C O P D  . PULMONARY NODULE, LEFT LOWER LOBE  . G E R D  . PATENT FORAMEN OVALE  . SNORING, HX OF  . Right middle lobe pneumonia  . Oral thrush  . Encounter for long-term (current) use of medications  . PAT (paroxysmal atrial tachycardia)  . Hyperglycemia, drug-induced    Past Medical History  Diagnosis Date  . Depression   . GERD (gastroesophageal reflux disease)   . HTN (hypertension)   . Hiatal hernia   . COPD (chronic obstructive pulmonary disease)   . Diverticulitis   . Stroke   . DVT (deep venous thrombosis)   . Obesity   . Hyperlipidemia   . PFO (patent foramen ovale)   . PAT (paroxysmal atrial tachycardia)   . Right middle lobe pneumonia 07/24/2011    First noted at admit 07/10/11. Persists on cxr 07/22/11. Cleared on CT 08/24/11. No further followup  . PULMONARY NODULE, LEFT LOWER LOBE 10/14/2009   5mm LLL nodule dec 2010. Stable and 4mm in Oct 2012. No further fu  . TOBACCO ABUSE 06/04/2009  . Osteoporosis   . Fibromyalgia     Past Surgical History  Procedure Date  . Total abdominal hysterectomy     post op needed oxygen was told "she gave them a scare"  . Knee arthroscopy 2000    left  . Laparoscopic cholecystectomy 04-16-2010    [facility]  . Tubal ligation   . US echocardiography 11/20/2009    EF 55-60%  . Cardiovascular stress test 12/26/2004    EF 74%. NO EVIDENCE OF ISCHEMIA    History  Substance Use Topics  . Smoking status: Former Smoker    Quit date: 06/02/2010  . Smokeless tobacco: Not on file  . Alcohol Use: No    Family History  Problem Relation Age of Onset  . Dementia Mother   . Diabetes Mother   . Alzheimer's disease Mother   . Heart attack Brother 6  . Schizophrenia Sister   . Diabetes Sister   . Tremor Sister   . Heart attack Father     Allergies  Allergen Reactions  . Alprazolam     REACTION: stops breathing  . Budesonide-Formoterol Fumarate     bliusters  . Avelox (Moxifloxacin Hcl In Nacl)   .  Esomeprazole Magnesium     REACTION: "bouncing off walls"  . Esomeprazole Magnesium   . Eszopiclone     REACTION: "slept for a week"  . Eszopiclone   . Iodine     REACTION: swelling in throat  . Loratadine   . Lotrimin (Clotrimazole)   . Pseudoephedrine Hcl Er   . Zolpidem Tartrate     REACTION: "slept for a week"  . Bextra (Valdecoxib) Rash  . Ceclor (Cefaclor) Rash  . Covera-Hs (Verapamil Hcl) Palpitations  . Dicyclomine Hcl Rash  . Tessalon Perles Rash    Medication list has been reviewed and updated.  Current Outpatient Prescriptions on File Prior to Visit  Medication Sig Dispense Refill  . albuterol (PROVENTIL) (2.5 MG/3ML) 0.083% nebulizer solution Take 2.5 mg by nebulization every 6 (six) hours as needed.      Marland Kitchen albuterol (VENTOLIN HFA) 108 (90 BASE) MCG/ACT inhaler Inhale 2 puffs into the lungs every 4 (four) hours as needed  for wheezing.  8.5 Inhaler  11  . aspirin 325 MG tablet Take 325 mg by mouth daily.        . Biotin 10 MG TABS Take 1 tablet by mouth daily.      . cyclobenzaprine (FLEXERIL) 10 MG tablet Take 10 mg by mouth 3 (three) times daily.       Marland Kitchen desvenlafaxine (PRISTIQ) 50 MG 24 hr tablet Take 50 mg by mouth daily.        . fish oil-omega-3 fatty acids 1000 MG capsule Take 2 g by mouth daily.      Marland Kitchen glucosamine-chondroitin 500-400 MG tablet Take 1 tablet by mouth 3 (three) times daily.      Marland Kitchen HYDROcodone-acetaminophen (VICODIN) 5-500 MG per tablet Take 1 tablet by mouth 2 (two) times daily.       Marland Kitchen ipratropium (ATROVENT) 0.02 % nebulizer solution Take 500 mcg by nebulization 2 (two) times daily.       Marland Kitchen lidocaine (LIDODERM) 5 % Place 3 patches onto the skin daily. Remove & Discard patch within 12 hours or as directed by MD       . metoprolol succinate (TOPROL-XL) 25 MG 24 hr tablet Take 12.5 mg by mouth daily.        . montelukast (SINGULAIR) 10 MG tablet TAKE 1 TABLET AT BEDTIME  30 tablet  4  . NORVASC 5 MG tablet TAKE 1 TABLET BY MOUTH EVERY DAY  30 tablet  2  . pregabalin (LYRICA) 75 MG capsule Take 75 mg by mouth daily.        . traZODone (DESYREL) 100 MG tablet Take 300 mg by mouth at bedtime.        . clarithromycin (BIAXIN) 250 MG tablet Take 1 tablet (250 mg total) by mouth 2 (two) times daily.  10 tablet  0  . Diphenhyd-Hydrocort-Nystatin (FIRST-DUKES MOUTHWASH) SUSP One tsp as rinse gargle and spit 4 times a day.  120 mL  5    Review of Systems:  As per HPI, otherwise negative.    Physical Examination: Filed Vitals:   08/21/12 1817  BP: 130/82  Pulse: 96  Temp: 97.6 F (36.4 C)  Resp: 16   Filed Vitals:   08/21/12 1817  Weight: 199 lb 6.4 oz (90.447 kg)   There is no height on file to calculate BMI. Ideal Body Weight:    GEN: WDWN, NAD, Non-toxic, A & O x 3.  Moderate dyspnea.  Speaks in full sentences.   HEENT: Atraumatic, Normocephalic. Neck supple. No  masses, No LAD.   Oropharynx negativae Ears and Nose: No external deformity.  TM negative CV: RRR, No M/G/R. No JVD. No thrill. No extra heart sounds. PULM: CTA B poor air movement, no wheezes, crackles, rhonchi. No retractions. No resp. distress. No accessory muscle use.  Marked chest wall tenderness left chest in inframammary region mid clavicular line.  No flail or crepitus. ABD: S, NT, ND, +BS. No rebound. No HSM. EXTR: No c/c/e NEURO Normal gait.  PSYCH: Normally interactive. Conversant. Not depressed or anxious appearing.  Calm demeanor.    Assessment and Plan: Chest wall contusion COPD Increase vicodin Follow up with Dr Cleta Alberts  Follow up for fever chills, increased shortness of breath.  Carmelina Dane, MD  [facility] reading (PRIMARY) by  Dr. Dareen Piano.  Unchanged COPD no pneumo or hemothorax.  I have reviewed and agree with documentation. Robert P. Merla Riches, M.D.

## 2012-08-25 ENCOUNTER — Telehealth: Payer: Self-pay

## 2012-08-25 NOTE — Telephone Encounter (Signed)
PT STATES SHE WAS SEEN BY DR Dareen Piano AND TOLD TO CALL BACK IN 4 DAYS IF NO BETTER AND SHE IS WORSE PLEASE CALL [phone]

## 2012-08-25 NOTE — Telephone Encounter (Signed)
Patient needs to come back in, number given disconnected, other number not accepting calls.

## 2012-08-26 NOTE — Telephone Encounter (Signed)
Pt reports that she is improving just a little. She states that she is using her O2 and nebulizer and she only has SOB occasionally w/exertion. Her phlegm no longer has any color, it is clear. Pt states she is still in a lot of pain but not as bad as she was on Wed. Pain is only very bad when she gets up and down. Pt is out of the Vicodin 5-325 that Dr Dareen Piano Rxd and is using her Vicodin 5-500 that she has for her fibromyalgia and it is helping more. D/W pt use of heat/ice and pt stated she might try that to see if it helps. Advised pt to RTC if SOB worsens again or if her Sxs persist. Pt agreed.

## 2012-08-26 NOTE — Telephone Encounter (Signed)
LMOM at [phone] to CB.

## 2012-09-09 ENCOUNTER — Other Ambulatory Visit: Payer: Self-pay | Admitting: Orthopaedic Surgery

## 2012-09-09 DIAGNOSIS — M549 Dorsalgia, unspecified: Secondary | ICD-10-CM

## 2012-09-10 ENCOUNTER — Other Ambulatory Visit: Payer: Self-pay | Admitting: Physician Assistant

## 2012-09-12 ENCOUNTER — Ambulatory Visit
Admission: RE | Admit: 2012-09-12 | Discharge: 2012-09-12 | Disposition: A | Discharge status: Home / Self Care | Payer: Medicare Other | Source: Ambulatory Visit | Attending: Orthopaedic Surgery | Admitting: Orthopaedic Surgery

## 2012-09-12 DIAGNOSIS — M549 Dorsalgia, unspecified: Secondary | ICD-10-CM

## 2012-09-13 ENCOUNTER — Other Ambulatory Visit: Payer: Self-pay | Admitting: Radiology

## 2012-09-13 ENCOUNTER — Other Ambulatory Visit: Payer: Self-pay | Admitting: Emergency Medicine

## 2012-09-27 ENCOUNTER — Ambulatory Visit (INDEPENDENT_AMBULATORY_CARE_PROVIDER_SITE_OTHER): Payer: Medicare Other | Admitting: Emergency Medicine

## 2012-09-27 ENCOUNTER — Encounter: Payer: Self-pay | Admitting: Emergency Medicine

## 2012-09-27 VITALS — BP 128/82 | HR 74 | Temp 98.8°F | Resp 20 | Ht 64.0 in | Wt 199.0 lb

## 2012-09-27 DIAGNOSIS — J449 Chronic obstructive pulmonary disease, unspecified: Secondary | ICD-10-CM

## 2012-09-27 DIAGNOSIS — I10 Essential (primary) hypertension: Secondary | ICD-10-CM

## 2012-09-27 DIAGNOSIS — R739 Hyperglycemia, unspecified: Secondary | ICD-10-CM

## 2012-09-27 DIAGNOSIS — R7989 Other specified abnormal findings of blood chemistry: Secondary | ICD-10-CM

## 2012-09-27 DIAGNOSIS — E782 Mixed hyperlipidemia: Secondary | ICD-10-CM

## 2012-09-27 LAB — CBC WITH DIFFERENTIAL/PLATELET
Eosinophils Absolute: 0.2 10*3/uL (ref 0.0–0.7)
Hemoglobin: 15.1 g/dL — ABNORMAL HIGH (ref 12.0–15.0)
Lymphocytes Relative: 25 % (ref 12–46)
Lymphs Abs: 1.5 10*3/uL (ref 0.7–4.0)
MCH: 32.5 pg (ref 26.0–34.0)
Neutro Abs: 3.9 10*3/uL (ref 1.7–7.7)
Neutrophils Relative %: 64 % (ref 43–77)
Platelets: 203 10*3/uL (ref 150–400)
RBC: 4.65 MIL/uL (ref 3.87–5.11)
WBC: 6.2 10*3/uL (ref 4.0–10.5)

## 2012-09-27 LAB — COMPREHENSIVE METABOLIC PANEL
Albumin: 4.6 g/dL (ref 3.5–5.2)
CO2: 28 mEq/L (ref 19–32)
Calcium: 10 mg/dL (ref 8.4–10.5)
Chloride: 100 mEq/L (ref 96–112)
Glucose, Bld: 91 mg/dL (ref 70–99)
Potassium: 4.6 mEq/L (ref 3.5–5.3)
Sodium: 137 mEq/L (ref 135–145)
Total Protein: 6.5 g/dL (ref 6.0–8.3)

## 2012-09-27 LAB — POCT GLYCOSYLATED HEMOGLOBIN (HGB A1C): Hemoglobin A1C: 5.4

## 2012-09-27 NOTE — Progress Notes (Signed)
  Subjective:    Patient ID: Laurie Horton, female    DOB: 1946-02-22, 66 y.o.   MRN: [id number]  HPI patient in for recheck of her hypertension. Also in for recheck of high cholesterol. She has O2 dependent COPD and is under the care of Dr. Marchelle Gearing. She saw him recently and received her flu shot and Pneumovax. She has some rough moles on her back she would like checked. She fell recently and sustained an injury to her left anterior chest. Chest x-ray done at that time was read by the radiologist as no acute disease no fractures.    Review of Systems     Objective:   Physical Exam her neck is supple no carotid bruits her chest was diminished breath sounds in the bases with increased AP diameter. Cardiac exam was regular rate without murmurs rubs or gallops the she has seborrheic keratoses present in both flank areas there were no suspicious moles seen . There is mild tenderness over the left costal margin.       Assessment & Plan:  Patient is stable at present. There no changes in her medication. She's been updated on her vaccinations. Recheck in 8-12 weeks

## 2012-10-10 ENCOUNTER — Other Ambulatory Visit: Payer: Self-pay | Admitting: Physician Assistant

## 2012-11-03 ENCOUNTER — Telehealth: Payer: Self-pay

## 2012-11-03 NOTE — Telephone Encounter (Signed)
Pt needs referral to dermatologist she said she just saw a doctor and he thinks she has skin cancer please call patient at [phone]

## 2012-11-03 NOTE — Telephone Encounter (Signed)
Patient states she [facility]. She has a spot on her right  ear, and he was concerned about this. She also has spot on her back she is concerned about.  She is going to see Dr Park Liter for this, the appt has already been made. She just wanted to let Dr Cleta Alberts know she is going for this appt b/c he is her primary.   FYI

## 2012-11-04 NOTE — Telephone Encounter (Signed)
Thanks, I did advise her of this.

## 2012-11-04 NOTE — Telephone Encounter (Signed)
Please ask Saphia to be sure and have the dermatologist send me copies of her evaluation and any  biopsies that are done.

## 2012-11-15 ENCOUNTER — Other Ambulatory Visit: Payer: Self-pay | Admitting: Emergency Medicine

## 2012-11-16 ENCOUNTER — Other Ambulatory Visit: Payer: Self-pay | Admitting: Emergency Medicine

## 2012-12-06 ENCOUNTER — Telehealth: Payer: Self-pay

## 2012-12-06 NOTE — Telephone Encounter (Signed)
Dr Cleta Alberts has written a comment on pt's need for brand name Norvasc and I have faxed form into [facility]. Awaiting decision.

## 2012-12-06 NOTE — Telephone Encounter (Signed)
Pt did not have SEs from generic amlodipine, she stated that it just was not nearly as effective for her, and reported her BP was consistently elevated once she was switched to generic.

## 2012-12-06 NOTE — Telephone Encounter (Signed)
Patient states that she cannot tolerate generic amlodipine and can only take the Norvasc have her tell us what specifically she had difficulty with prior to sending in the form requesting the brand name.

## 2012-12-06 NOTE — Telephone Encounter (Signed)
I received a form from [facility] requiring more info about why pt needs Brand Name Norvasc instead of amlodipine. Called pt and she reports that when she was switched to amlodipine her BP went up to the 140s/ high 80s-90s and it had been controlled for years on the comb of brand Norvasc and Toprol. Pt does not remember specifically if she was ever put on Benicar, Diovan or Ranexa, but states that some of the meds the doctors tried caused SEs she couldn't tolerate, or they just didn't work. She stated that several doctors tried many combinations of meds to get her BP under control after her stroke. Dr Cleta Alberts, I have put the form in your box to see if you can answer the questions in order for pt to cont to get the Brand name Norvasc w/out having to pay $106 [facility].

## 2012-12-06 NOTE — Telephone Encounter (Signed)
We can switch her to the generic of Norvasc. It would be fine for her to switch over to amlodipine

## 2012-12-08 NOTE — Telephone Encounter (Signed)
Received approval notice for lower tier approval for pt's brand name Norvasc and I notified pt.

## 2012-12-27 ENCOUNTER — Ambulatory Visit (INDEPENDENT_AMBULATORY_CARE_PROVIDER_SITE_OTHER): Payer: 59 | Admitting: Emergency Medicine

## 2012-12-27 ENCOUNTER — Encounter: Payer: Self-pay | Admitting: Emergency Medicine

## 2012-12-27 VITALS — BP 114/77 | HR 99 | Temp 97.0°F | Resp 18 | Ht 63.25 in | Wt 200.8 lb

## 2012-12-27 DIAGNOSIS — R071 Chest pain on breathing: Secondary | ICD-10-CM

## 2012-12-27 DIAGNOSIS — IMO0001 Reserved for inherently not codable concepts without codable children: Secondary | ICD-10-CM

## 2012-12-27 DIAGNOSIS — D4860 Neoplasm of uncertain behavior of unspecified breast: Secondary | ICD-10-CM

## 2012-12-27 DIAGNOSIS — J449 Chronic obstructive pulmonary disease, unspecified: Secondary | ICD-10-CM

## 2012-12-27 DIAGNOSIS — J438 Other emphysema: Secondary | ICD-10-CM

## 2012-12-27 DIAGNOSIS — I10 Essential (primary) hypertension: Secondary | ICD-10-CM

## 2012-12-27 DIAGNOSIS — M797 Fibromyalgia: Secondary | ICD-10-CM

## 2012-12-27 NOTE — Progress Notes (Signed)
  Subjective:    Patient ID: Laurie Horton, female    DOB: 1946/08/31, 67 y.o.   MRN: [id number]  HPI problem #1 COPD patient here doing well. She is a patient of the pulmonologist and has used inhalers as instructed. She has O2 to use 21/7 #2 hypertension she is currently on the trade drug Norvasc and doing well. Problem #3 fibromyalgia. She is under the care of Dr. Corliss Skains who monitors her closely for this problem. Problem #4 low back pain. She currently is [facility] under the direction Dr.Cohen. Problem #5 breast mass. She is scheduled to have surgery tomorrow for biopsy of these lesions of the left breast. A #6 skin lesion of the right year. Biopsy showed an actinic keratosis and she continues to have pain at the biopsy site   Review of Systems     Objective:   Physical Exam patient looks good she is in no distress. Her neck is supple. Chest clear to auscultation and percussion anteriorly but she has dry rales in both bases. Her pulse ox was 8889 resting. Abdomen is soft without tenderness extremities without        Assessment & Plan:  Medical problems were stable at the present time she has had her blood work done recently by Dr. Corliss Skains so  these were not repeated

## 2012-12-28 ENCOUNTER — Other Ambulatory Visit: Payer: Self-pay | Admitting: Radiology

## 2013-01-16 ENCOUNTER — Encounter: Payer: Self-pay | Admitting: Emergency Medicine

## 2013-01-18 ENCOUNTER — Encounter (INDEPENDENT_AMBULATORY_CARE_PROVIDER_SITE_OTHER): Payer: Self-pay

## 2013-01-18 ENCOUNTER — Encounter: Payer: Self-pay | Admitting: Emergency Medicine

## 2013-01-19 ENCOUNTER — Telehealth: Payer: Self-pay | Admitting: Internal Medicine

## 2013-01-19 NOTE — Telephone Encounter (Signed)
I spoke with the pt and she states nothing further needed, she just wanted to make sure that she was not due for an appt. I advised the pt according to last OV she is to f/u in 9 months. Carron Curie, CMA

## 2013-02-16 ENCOUNTER — Other Ambulatory Visit: Payer: Self-pay | Admitting: Physician Assistant

## 2013-02-21 ENCOUNTER — Telehealth: Payer: Self-pay

## 2013-02-21 MED ORDER — METOPROLOL SUCCINATE ER 25 MG PO TB24: 12.5000 mg | ORAL_TABLET | Freq: Every day | ORAL | Status: DC

## 2013-02-21 NOTE — Telephone Encounter (Signed)
Pt states wrong dosage was called in for her toporol?     Best phone number [phone]  [facility]

## 2013-02-21 NOTE — Telephone Encounter (Signed)
Patient states / pharmacy states she is taking 1/2 a pill a day of this medication this is corrected, and resent. To you FYI

## 2013-03-12 ENCOUNTER — Other Ambulatory Visit: Payer: Self-pay | Admitting: Physician Assistant

## 2013-03-15 ENCOUNTER — Encounter: Payer: Self-pay | Admitting: Cardiology

## 2013-03-24 ENCOUNTER — Other Ambulatory Visit: Payer: Self-pay | Admitting: Gastroenterology

## 2013-03-24 DIAGNOSIS — R109 Unspecified abdominal pain: Secondary | ICD-10-CM

## 2013-03-29 ENCOUNTER — Inpatient Hospital Stay: Admission: RE | Admit: 2013-03-29 | Payer: Medicare Other | Source: Ambulatory Visit

## 2013-03-29 ENCOUNTER — Ambulatory Visit
Admission: RE | Admit: 2013-03-29 | Discharge: 2013-03-29 | Disposition: A | Discharge status: Home / Self Care | Payer: Medicare Other | Source: Ambulatory Visit | Attending: Gastroenterology | Admitting: Gastroenterology

## 2013-04-18 ENCOUNTER — Other Ambulatory Visit: Payer: Self-pay | Admitting: Emergency Medicine

## 2013-04-25 ENCOUNTER — Ambulatory Visit (INDEPENDENT_AMBULATORY_CARE_PROVIDER_SITE_OTHER): Payer: Medicare Other | Admitting: Emergency Medicine

## 2013-04-25 ENCOUNTER — Encounter: Payer: Self-pay | Admitting: Emergency Medicine

## 2013-04-25 VITALS — BP 138/80 | HR 98 | Temp 99.1°F | Resp 16 | Ht 63.0 in | Wt 204.2 lb

## 2013-04-25 DIAGNOSIS — F339 Major depressive disorder, recurrent, unspecified: Secondary | ICD-10-CM

## 2013-04-25 DIAGNOSIS — J9600 Acute respiratory failure, unspecified whether with hypoxia or hypercapnia: Secondary | ICD-10-CM

## 2013-04-25 DIAGNOSIS — J441 Chronic obstructive pulmonary disease with (acute) exacerbation: Secondary | ICD-10-CM

## 2013-04-25 DIAGNOSIS — I499 Cardiac arrhythmia, unspecified: Secondary | ICD-10-CM

## 2013-04-25 DIAGNOSIS — J9601 Acute respiratory failure with hypoxia: Secondary | ICD-10-CM

## 2013-04-25 NOTE — Progress Notes (Signed)
  Subjective:    Patient ID: Laurie Horton, female    DOB: 1946-03-06, 67 y.o.   MRN: [id number]  HPI patient enters for routine followup. She was started on medication by the psychiatrist which she states she cannot tolerate because it knocks her out makes her so sleepy. She has no complaints of chest pain and states she is not increasingly short of breath. She has had chronic abdominal pain and seen Dr. Randa Evens who diagnosed her with diverticulosis she has had no recent visits to Dr. Marchelle Gearing. She continues under the care of Dr. Corliss Skains  her fibromyalgia appear    Review of Systems     Objective:   Physical Exam patient is alert and cooperative she is in no distress. Neck is supple. Chest exam reveals increased AP diameter but no wheezes were heard. Cardiac exam revealed a slightly irregular rhythm with skipped beats noted. Abdomen was without masses extremities without edema        Assessment & Plan:  Her pulse ox today was lower than her usual. She did exert herself coming back into the exam room but did not have any complaints of shortness of breath. She had a pulse ox of 78 with a respiratory rate of only 16 . Baseline EKG will be checked to make sure she is in sinus rhythm. She has a history of PAT she mentioned she is considering back surgery if she has a flare. I told her she would have to have cardiac and pulmonary clearance and this would have to be performed in the hospital if she chose to have this done. She states she cannot tolerate her psychiatric drugs and I told her to hold these until she speaks with a psychiatrist on Thursday . We put her on O2 at 2 L for 10 minutes and her O2 sat increased to 95% .

## 2013-05-02 ENCOUNTER — Ambulatory Visit (INDEPENDENT_AMBULATORY_CARE_PROVIDER_SITE_OTHER): Payer: Medicare Other | Admitting: Internal Medicine

## 2013-05-02 ENCOUNTER — Encounter: Payer: Self-pay | Admitting: Internal Medicine

## 2013-05-02 VITALS — BP 110/70 | HR 108 | Temp 99.0°F | Ht 64.0 in | Wt 200.0 lb

## 2013-05-02 DIAGNOSIS — R942 Abnormal results of pulmonary function studies: Secondary | ICD-10-CM

## 2013-05-02 DIAGNOSIS — J449 Chronic obstructive pulmonary disease, unspecified: Secondary | ICD-10-CM

## 2013-05-02 DIAGNOSIS — Z01811 Encounter for preprocedural respiratory examination: Secondary | ICD-10-CM

## 2013-05-02 DIAGNOSIS — J4489 Other specified chronic obstructive pulmonary disease: Secondary | ICD-10-CM

## 2013-05-02 NOTE — Assessment & Plan Note (Signed)
#  COPD - stable disease  - continue oxygen and nebs - next visit do full PFT breathing test

## 2013-05-02 NOTE — Patient Instructions (Addendum)
#  COPD - stable disease  - continue oxygen and nebs - next visit do full PFT breathing test  #Preoperative pulmonray evaluation  - will talk to Dr Retia Passe; my nurse will reach his office now [phone]  - Will call you after I talk to him  #Followup - 3 months with full PFT

## 2013-05-02 NOTE — Progress Notes (Signed)
Subjective:    Patient ID: Laurie Horton, female    DOB: 02-09-1946, 67 y.o.   MRN: [id number]  HPI  Problem List 1. Smoker -  quit August 2011  reports that she quit smoking about 2 years ago. She does not have any smokeless tobacco history on file.   2. COPD   - with small PFO, exertional desat as of Sept 2011   - isolated low dlco on PFT Oct 2012 with emphysema on CT chest:  PFT 08/28/11 - shows isolated low dlco only 43%, Fev1 2.111/100%, ratio 60. - using oxygen 2013 following years of refusal  3. LLL pulmonary micronodule 0.6cm with subcentimeter mediastinal nodes  - dec 2010, aug 2011, oct 2012 - no further followup  4. Admissions for Resp failure/AECOPD   - Post p-lap for gall bladder - complicated by ICU resp failure and pneumonia LLL - March 2011, resolved on CT aug 2011 - RML PNA Sept 2012; resolved on CT oct 2012  5. Recurrent AECOPD  - aug/sept 2012 - Dr Cleta Alberts opd Rx - oct 2012 (office rx)  6. Poor health literacy, poly pharmacy, lives alone, pscyh issues  7. Code Status:  --DNAR, DNAI including no short term ventilation  - Oct 2012 pulm office visit. Thinks she will be dead by 09/24/2012- changed to full code - July 2014 but " do not want to be a vegetable"    OV 08/01/2012  COPD - mild yellow sputum, increase cough and increased wheeze past  2 weeks. No change in dyspnea or effort tolerance. CAT score 20. Vaccine - pneumovax 2007 prior to age 48. FLu - not had yet. Reluctantly agreed to have both. She is worried she had 'flu' afeter flu shot in 2012-2013 season but I explained advantages and limitations of flu shot and she agreed.   Smoking - still in remission  #COPD You have mild attack of copd called COPD exacerbation Please take biaxin per your request No need for prednisone burst at this point. However, if this gets worse you might need prednisone but right now you do not Have flu shot and pneumovax vaccine 08/01/2012 today Continue your medications and  oxygen  #Smoking  - glad you are still quit  #Followup  9 months or sooner if needed  OV 05/02/2013  Routine COPD followup. Feels baseline. Compliant with nebs and oxygen. Smoking in remission. COPD cat score down to 15  New problem: wants pre-op resp clearance for some kind of spinalprocedure. DEscribing L2-L5 radiculopathy and says Dr Retia Passe is going to "burn nerve roots" because she is refractory or only has temp re;lief with epidural shots. Wants permanent relief. PCP DAUB, STEVE A, MD has cuationed against surgerygiven prior experience with post op resp failure. I did speak to Dr  Glenard Haring. Saullo ahd he described 30 min RFA procedure on back with lidocaine +/- moderate sedation as tye typical procedure. She has had problems with moderate sedation before.  Preop risk assessment scores without use of sedation shows that her risk for pulmonary complications for this procedure is LOW   CAT COPD Symptom & Quality of Life Score (GSK trademark) 0 is no burden. 5 is highest burden 08/01/2012 She is in aecopd 05/02/2013   Never Cough -> Cough all the time 3 2  No phlegm in chest -> Chest is full of phlegm 3 0  No chest tightness -> Chest feels very tight 3 0  No dyspnea for 1 flight stairs/hill -> Very dyspneic for  1 flight of stairs 2 5  No limitations for ADL at home -> Very limited with ADL at home 1 3  Confident leaving home -> Not at all confident leaving home 1 0  Sleep soundly -> Do not sleep soundly because of lung condition 3 0  Lots of Energy -> No energy at all 4 5  TOTAL Score (max 40)  20 15     Arozullah Postperative Pulmonary Risk Score PAtient score  Type of surgery - abd ao aneurysm (27), thoracic (21), neurosurgery / upper abdominal / vascular (21), neck (11) RFA procedure on back  with patient prone. 0  Emergency Surgery - (11) elective 0  ALbumin < 3 or poor nutritional state - (9) Alb 4.6 in nov 2013 0  BUN > 30 -  (8) Bun 13 in nov 2013 0  Partial or completely  dependent functional status - (7) independent 0  COPD -  (6) Copd + 6  Age - 75 to 80 (4), > 70  (6) Age 63 4  TOTAL  10  Risk Stratifcation scores  - < 10, 11-19, 20-27, 28-40, >40  LOW RISK     CANET Postperative Pulmonary Risk Score patient score  Age - <50 (0), 50-80 (3), >80 (16) Age is 71 3  Preoperative pulse ox - >96 (0), 91-95 (8), <90 (24) Hypoxemic at baseline 24  Respiratory infection in last month - Yes (17) no 0  Preoperative anemia - < 10gm% - Yes (11) hgb 15gm% in nov 2013 0  Surgical incision - Upper abdominal (15), Thoracic (24) Back lidocaine proceure 0  Duration of surgery - <2h (0), 2-3h (16), >3h (23) 30 min RFA procedure 0  Emergency Surgery - Yes (8) Elective RFA procedure 0  TOTAL  27  Risk Stratification - Low (<26), Intermediate (26-44), High (>45)  LOW    Past, Family, Social reviewed: no change since last visit    Review of Systems  Constitutional: Negative for fever and unexpected weight change.  HENT: Negative for ear pain, nosebleeds, congestion, sore throat, rhinorrhea, sneezing, trouble swallowing, dental problem, postnasal drip and sinus pressure.   Eyes: Negative for redness and itching.  Respiratory: Positive for shortness of breath. Negative for cough, chest tightness and wheezing.   Cardiovascular: Negative for palpitations and leg swelling.  Gastrointestinal: Negative for nausea and vomiting.  Genitourinary: Negative for dysuria.  Musculoskeletal: Negative for joint swelling.  Skin: Negative for rash.  Neurological: Positive for light-headedness and headaches.  Hematological: Does not bruise/bleed easily.  Psychiatric/Behavioral: Negative for dysphoric mood. The patient is not nervous/anxious.    Current outpatient prescriptions:albuterol (PROVENTIL) (2.5 MG/3ML) 0.083% nebulizer solution, Take 2.5 mg by nebulization every 6 (six) hours as needed., Disp: , Rfl: ;  albuterol (VENTOLIN HFA) 108 (90 BASE) MCG/ACT inhaler, Inhale 2 puffs into  the lungs every 4 (four) hours as needed for wheezing., Disp: 8.5 Inhaler, Rfl: 11;  aspirin 325 MG tablet, Take 325 mg by mouth daily.  , Disp: , Rfl:  Biotin 10 MG TABS, Take 1 tablet by mouth daily., Disp: , Rfl: ;  cyclobenzaprine (FLEXERIL) 10 MG tablet, Take 10 mg by mouth 3 (three) times daily. , Disp: , Rfl: ;  desvenlafaxine (PRISTIQ) 50 MG 24 hr tablet, Take 50 mg by mouth daily.  , Disp: , Rfl: ;  diazepam (VALIUM) 5 MG tablet, Take 5 mg by mouth as needed., Disp: , Rfl: ;  fish oil-omega-3 fatty acids 1000 MG capsule, Take 2 g by mouth  daily., Disp: , Rfl:  glucosamine-chondroitin 500-400 MG tablet, Take 1 tablet by mouth 3 (three) times daily., Disp: , Rfl: ;  HYDROcodone-acetaminophen (NORCO/VICODIN) 5-325 MG per tablet, TAKE 1 TO 2 TABLETS EVERY 4 HOURS AS NEEDED FOR PAIN, Disp: 30 tablet, Rfl: 0;  HYDROcodone-acetaminophen (VICODIN) 5-500 MG per tablet, Take 1 tablet by mouth 2 (two) times daily. , Disp: , Rfl:  ipratropium (ATROVENT) 0.02 % nebulizer solution, Take 500 mcg by nebulization 2 (two) times daily. , Disp: , Rfl: ;  lidocaine (LIDODERM) 5 %, Place 3 patches onto the skin daily. Remove & Discard patch within 12 hours or as directed by MD , Disp: , Rfl: ;  metoprolol succinate (TOPROL XL) 25 MG 24 hr tablet, Take 0.5 tablets (12.5 mg total) by mouth daily., Disp: 15 tablet, Rfl: 3 montelukast (SINGULAIR) 10 MG tablet, TAKE 1 TABLET AT BEDTIME, Disp: 30 tablet, Rfl: 4;  NORVASC 5 MG tablet, TAKE 1 TABLET (5 MG TOTAL) BY MOUTH DAILY., Disp: 30 tablet, Rfl: 5;  oxybutynin (DITROPAN) 5 MG tablet, Take 5 mg by mouth daily., Disp: , Rfl: ;  pregabalin (LYRICA) 75 MG capsule, Take 75 mg by mouth daily.  , Disp: , Rfl: ;  traZODone (DESYREL) 100 MG tablet, Take 300 mg by mouth at bedtime.  , Disp: , Rfl:  Vitamin D, Ergocalciferol, (DRISDOL) 50000 UNITS CAPS, Take 50,000 Units by mouth every 7 (seven) days., Disp: , Rfl:      Objective:   Physical Exam  Vitals reviewed. Constitutional:  She is oriented to person, place, and time. She appears well-developed and well-nourished. No distress.       obese  HENT:  Head: Normocephalic and atraumatic.  Right Ear: External ear normal.  Left Ear: External ear normal.  Mouth/Throat: Normal Eyes: Conjunctivae and EOM are normal. Pupils are equal, round, and reactive to light. Right eye exhibits no discharge. Left eye exhibits no discharge. No scleral icterus.  Neck: Normal range of motion. Neck supple. No JVD present. No tracheal deviation present. No thyromegaly present.  Cardiovascular: Normal rate, regular rhythm, normal heart sounds and intact distal pulses.  Exam reveals no gallop and no friction rub.   No murmur heard. Pulmonary/Chest: Effort normal. No respiratory distress. She has no wheezes. She has no rales. She exhibits no tenderness.       Prolonged expiration No wheeze barrell chest  Abdominal: Soft. Bowel sounds are normal. She exhibits no distension and no mass. There is no tenderness. There is no rebound and no guarding.  Musculoskeletal: Normal range of motion. She exhibits no edema and no tenderness.  Lymphadenopathy:    She has no cervical adenopathy.  Neurological: She is alert and oriented to person, place, and time. She has normal reflexes. No cranial nerve deficit. She exhibits normal muscle tone. Coordination normal.  Skin: Skin is warm and dry. No rash noted. She is not diaphoretic. No erythema. No pallor.  Psychiatric: She has a normal mood and affect. Her behavior is normal. Judgment and thought content normal.             Assessment & Plan:

## 2013-05-02 NOTE — Assessment & Plan Note (Signed)
   Arozullah Postperative Pulmonary Risk Score PAtient score  Type of surgery - abd ao aneurysm (27), thoracic (21), neurosurgery / upper abdominal / vascular (21), neck (11) RFA procedure on back  with patient prone. 0  Emergency Surgery - (11) elective 0  ALbumin < 3 or poor nutritional state - (9) Alb 4.6 in nov 2013 0  BUN > 30 -  (8) Bun 13 in nov 2013 0  Partial or completely dependent functional status - (7) independent 0  COPD -  (6) Copd + 6  Age - 50 to 83 (4), > 70  (6) Age 65 4  TOTAL  10  Risk Stratifcation scores  - < 10, 11-19, 20-27, 28-40, >40  LOW RISK     CANET Postperative Pulmonary Risk Score patient score  Age - <50 (0), 50-80 (3), >80 (16) Age is 66 3  Preoperative pulse ox - >96 (0), 91-95 (8), <90 (24) Hypoxemic at baseline 24  Respiratory infection in last month - Yes (17) no 0  Preoperative anemia - < 10gm% - Yes (11) hgb 15gm% in nov 2013 0  Surgical incision - Upper abdominal (15), Thoracic (24) Back lidocaine proceure 0  Duration of surgery - <2h (0), 2-3h (16), >3h (23) 30 min RFA procedure 0  Emergency Surgery - Yes (8) Elective RFA procedure 0  TOTAL  27  Risk Stratification - Low (<26), Intermediate (26-44), High (>45)  LOW    Risk stratification for RFA back procedure is LOW as long as there is no sedation used. I would recommend there be absolutely no moderate sedation and only tpical lidocaine be used.   We have communcated this with patient after her visit via phone call by RN Leotis Shames. I have also communicated this with Dr Retia Passe.   If sedation needed recommend anethesia support at hospoital

## 2013-05-03 ENCOUNTER — Ambulatory Visit: Payer: Medicare Other | Admitting: Cardiology

## 2013-05-04 ENCOUNTER — Inpatient Hospital Stay (HOSPITAL_COMMUNITY)
Admission: EM | Admit: 2013-05-04 | Discharge: 2013-05-05 | DRG: 392 | Disposition: A | Discharge status: Home / Self Care | Payer: Medicare Other | Attending: Family Medicine | Admitting: Family Medicine

## 2013-05-04 ENCOUNTER — Emergency Department (HOSPITAL_COMMUNITY): Payer: Medicare Other

## 2013-05-04 ENCOUNTER — Encounter (HOSPITAL_COMMUNITY): Payer: Self-pay | Admitting: Emergency Medicine

## 2013-05-04 ENCOUNTER — Ambulatory Visit (INDEPENDENT_AMBULATORY_CARE_PROVIDER_SITE_OTHER): Payer: Medicare Other | Admitting: Emergency Medicine

## 2013-05-04 VITALS — BP 164/96 | HR 114 | Resp 22

## 2013-05-04 DIAGNOSIS — Q211 Atrial septal defect: Secondary | ICD-10-CM

## 2013-05-04 DIAGNOSIS — I471 Supraventricular tachycardia, unspecified: Secondary | ICD-10-CM | POA: Diagnosis present

## 2013-05-04 DIAGNOSIS — R000 Tachycardia, unspecified: Secondary | ICD-10-CM

## 2013-05-04 DIAGNOSIS — R0602 Shortness of breath: Secondary | ICD-10-CM

## 2013-05-04 DIAGNOSIS — F3289 Other specified depressive episodes: Secondary | ICD-10-CM | POA: Diagnosis present

## 2013-05-04 DIAGNOSIS — Z86718 Personal history of other venous thrombosis and embolism: Secondary | ICD-10-CM

## 2013-05-04 DIAGNOSIS — IMO0001 Reserved for inherently not codable concepts without codable children: Secondary | ICD-10-CM | POA: Diagnosis present

## 2013-05-04 DIAGNOSIS — R0902 Hypoxemia: Secondary | ICD-10-CM

## 2013-05-04 DIAGNOSIS — R0789 Other chest pain: Secondary | ICD-10-CM | POA: Diagnosis present

## 2013-05-04 DIAGNOSIS — Z6835 Body mass index (BMI) 35.0-35.9, adult: Secondary | ICD-10-CM

## 2013-05-04 DIAGNOSIS — J438 Other emphysema: Secondary | ICD-10-CM | POA: Diagnosis present

## 2013-05-04 DIAGNOSIS — J449 Chronic obstructive pulmonary disease, unspecified: Secondary | ICD-10-CM

## 2013-05-04 DIAGNOSIS — K5289 Other specified noninfective gastroenteritis and colitis: Principal | ICD-10-CM | POA: Diagnosis present

## 2013-05-04 DIAGNOSIS — F329 Major depressive disorder, single episode, unspecified: Secondary | ICD-10-CM | POA: Diagnosis present

## 2013-05-04 DIAGNOSIS — K5732 Diverticulitis of large intestine without perforation or abscess without bleeding: Secondary | ICD-10-CM | POA: Diagnosis present

## 2013-05-04 DIAGNOSIS — E669 Obesity, unspecified: Secondary | ICD-10-CM | POA: Diagnosis present

## 2013-05-04 DIAGNOSIS — Z87891 Personal history of nicotine dependence: Secondary | ICD-10-CM

## 2013-05-04 DIAGNOSIS — Z8673 Personal history of transient ischemic attack (TIA), and cerebral infarction without residual deficits: Secondary | ICD-10-CM

## 2013-05-04 DIAGNOSIS — E860 Dehydration: Secondary | ICD-10-CM

## 2013-05-04 DIAGNOSIS — I10 Essential (primary) hypertension: Secondary | ICD-10-CM | POA: Diagnosis present

## 2013-05-04 DIAGNOSIS — K219 Gastro-esophageal reflux disease without esophagitis: Secondary | ICD-10-CM | POA: Diagnosis present

## 2013-05-04 DIAGNOSIS — E785 Hyperlipidemia, unspecified: Secondary | ICD-10-CM | POA: Diagnosis present

## 2013-05-04 DIAGNOSIS — R9431 Abnormal electrocardiogram [ECG] [EKG]: Secondary | ICD-10-CM

## 2013-05-04 DIAGNOSIS — J4489 Other specified chronic obstructive pulmonary disease: Secondary | ICD-10-CM | POA: Diagnosis present

## 2013-05-04 DIAGNOSIS — N390 Urinary tract infection, site not specified: Secondary | ICD-10-CM | POA: Diagnosis present

## 2013-05-04 DIAGNOSIS — M810 Age-related osteoporosis without current pathological fracture: Secondary | ICD-10-CM | POA: Diagnosis present

## 2013-05-04 DIAGNOSIS — R079 Chest pain, unspecified: Secondary | ICD-10-CM

## 2013-05-04 DIAGNOSIS — G8929 Other chronic pain: Secondary | ICD-10-CM | POA: Diagnosis present

## 2013-05-04 DIAGNOSIS — Z9981 Dependence on supplemental oxygen: Secondary | ICD-10-CM

## 2013-05-04 LAB — COMPREHENSIVE METABOLIC PANEL
ALT: 40 U/L — ABNORMAL HIGH (ref 0–35)
Alkaline Phosphatase: 68 U/L (ref 39–117)
BUN: 14 mg/dL (ref 6–23)
CO2: 24 mEq/L (ref 19–32)
GFR calc Af Amer: 62 mL/min — ABNORMAL LOW (ref >90)
GFR calc non Af Amer: 53 mL/min — ABNORMAL LOW (ref >90)
Glucose, Bld: 143 mg/dL — ABNORMAL HIGH (ref 70–99)
Potassium: 3.6 mEq/L (ref 3.5–5.1)
Sodium: 138 mEq/L (ref 135–145)

## 2013-05-04 LAB — CBC WITH DIFFERENTIAL/PLATELET
Eosinophils Relative: 1 % (ref 0–5)
Hemoglobin: 17.1 g/dL — ABNORMAL HIGH (ref 12.0–15.0)
Lymphocytes Relative: 18 % (ref 12–46)
Lymphs Abs: 1.3 10*3/uL (ref 0.7–4.0)
MCV: 91.1 fL (ref 78.0–100.0)
Monocytes Relative: 12 % (ref 3–12)
Neutrophils Relative %: 70 % (ref 43–77)
Platelets: 193 10*3/uL (ref 150–400)
RBC: 5.14 MIL/uL — ABNORMAL HIGH (ref 3.87–5.11)
WBC: 7.6 10*3/uL (ref 4.0–10.5)

## 2013-05-04 LAB — URINE MICROSCOPIC-ADD ON

## 2013-05-04 LAB — URINALYSIS, ROUTINE W REFLEX MICROSCOPIC
Hgb urine dipstick: NEGATIVE
Ketones, ur: 15 mg/dL — AB
Protein, ur: 100 mg/dL — AB
Urobilinogen, UA: 0.2 mg/dL (ref 0.0–1.0)

## 2013-05-04 LAB — CBC
HCT: 43.4 % (ref 36.0–46.0)
Hemoglobin: 15.4 g/dL — ABNORMAL HIGH (ref 12.0–15.0)
MCH: 32.6 pg (ref 26.0–34.0)
MCHC: 35.5 g/dL (ref 30.0–36.0)
MCV: 91.9 fL (ref 78.0–100.0)
Platelets: 181 10*3/uL (ref 150–400)
RBC: 4.72 MIL/uL (ref 3.87–5.11)
RDW: 13 % (ref 11.5–15.5)
WBC: 5.9 10*3/uL (ref 4.0–10.5)

## 2013-05-04 LAB — CREATININE, SERUM: Creatinine, Ser: 0.89 mg/dL (ref 0.50–1.10)

## 2013-05-04 LAB — LIPID PANEL
HDL: 37 mg/dL — ABNORMAL LOW (ref >39)
Triglycerides: 137 mg/dL (ref <150)

## 2013-05-04 LAB — POCT I-STAT TROPONIN I: Troponin i, poc: 0 ng/mL (ref 0.00–0.08)

## 2013-05-04 LAB — TROPONIN I
Troponin I: <0.3 ng/mL (ref <0.30)
Troponin I: <0.3 ng/mL (ref <0.30)

## 2013-05-04 LAB — LIPASE, BLOOD: Lipase: 18 U/L (ref 11–59)

## 2013-05-04 MED ORDER — SODIUM CHLORIDE 0.9 % IJ SOLN: 3.0000 mL | Freq: Two times a day (BID) | INTRAMUSCULAR | Status: DC

## 2013-05-04 MED ORDER — DEXTROSE 5 % IV SOLN
1.0000 g | Freq: Once | INTRAVENOUS | Status: AC
  Administered 2013-05-04: 1 g via INTRAVENOUS
  Filled 2013-05-04: qty 10

## 2013-05-04 MED ORDER — TRAZODONE HCL 150 MG PO TABS
300.0000 mg | ORAL_TABLET | Freq: Every day | ORAL | Status: DC
  Administered 2013-05-04: 300 mg via ORAL
  Filled 2013-05-04 (×2): qty 2

## 2013-05-04 MED ORDER — POLYETHYLENE GLYCOL 3350 17 G PO PACK
17.0000 g | PACK | Freq: Every day | ORAL | Status: DC | PRN
  Filled 2013-05-04: qty 1

## 2013-05-04 MED ORDER — CYCLOBENZAPRINE HCL 10 MG PO TABS
10.0000 mg | ORAL_TABLET | Freq: Two times a day (BID) | ORAL | Status: DC
Start: 2013-05-04 — End: 2013-05-05
  Administered 2013-05-04 – 2013-05-05 (×2): 10 mg via ORAL
  Filled 2013-05-04 (×4): qty 1

## 2013-05-04 MED ORDER — ALBUTEROL SULFATE HFA 108 (90 BASE) MCG/ACT IN AERS
2.0000 | INHALATION_SPRAY | RESPIRATORY_TRACT | Status: DC | PRN
  Filled 2013-05-04: qty 6.7

## 2013-05-04 MED ORDER — ALBUTEROL SULFATE (5 MG/ML) 0.5% IN NEBU: 2.5000 mg | INHALATION_SOLUTION | Freq: Four times a day (QID) | RESPIRATORY_TRACT | Status: DC | PRN

## 2013-05-04 MED ORDER — SODIUM CHLORIDE 0.9 % IV SOLN
INTRAVENOUS | Status: DC
  Administered 2013-05-04: 16:00:00 via INTRAVENOUS
  Administered 2013-05-05: 1000 mL via INTRAVENOUS

## 2013-05-04 MED ORDER — ALBUTEROL SULFATE (5 MG/ML) 0.5% IN NEBU
5.0000 mg | INHALATION_SOLUTION | Freq: Once | RESPIRATORY_TRACT | Status: AC
  Administered 2013-05-04: 5 mg via RESPIRATORY_TRACT

## 2013-05-04 MED ORDER — PANTOPRAZOLE SODIUM 40 MG PO TBEC
40.0000 mg | DELAYED_RELEASE_TABLET | Freq: Every day | ORAL | Status: DC
  Administered 2013-05-04 – 2013-05-05 (×2): 40 mg via ORAL
  Filled 2013-05-04: qty 1

## 2013-05-04 MED ORDER — SODIUM CHLORIDE 0.9 % IV BOLUS (SEPSIS)
1000.0000 mL | Freq: Once | INTRAVENOUS | Status: AC
  Administered 2013-05-04: 1000 mL via INTRAVENOUS

## 2013-05-04 MED ORDER — METOPROLOL SUCCINATE 12.5 MG HALF TABLET
12.5000 mg | ORAL_TABLET | Freq: Every day | ORAL | Status: DC
  Administered 2013-05-05: 12.5 mg via ORAL
  Filled 2013-05-04: qty 1

## 2013-05-04 MED ORDER — SODIUM CHLORIDE 0.9 % IV BOLUS (SEPSIS): 500.0000 mL | Freq: Once | INTRAVENOUS | Status: DC

## 2013-05-04 MED ORDER — DESVENLAFAXINE SUCCINATE ER 50 MG PO TB24
50.0000 mg | ORAL_TABLET | Freq: Every day | ORAL | Status: DC
  Administered 2013-05-05: 50 mg via ORAL
  Filled 2013-05-04: qty 1

## 2013-05-04 MED ORDER — HYDROCODONE-ACETAMINOPHEN 5-325 MG PO TABS
1.0000 | ORAL_TABLET | ORAL | Status: DC | PRN
  Administered 2013-05-04 – 2013-05-05 (×2): 2 via ORAL
  Filled 2013-05-04 (×2): qty 2

## 2013-05-04 MED ORDER — ASPIRIN 325 MG PO TABS
325.0000 mg | ORAL_TABLET | Freq: Every day | ORAL | Status: DC
Start: 2013-05-05 — End: 2013-05-05
  Administered 2013-05-05: 325 mg via ORAL
  Filled 2013-05-04: qty 1

## 2013-05-04 MED ORDER — PREGABALIN 50 MG PO CAPS
75.0000 mg | ORAL_CAPSULE | Freq: Every day | ORAL | Status: DC
  Filled 2013-05-04: qty 1

## 2013-05-04 MED ORDER — MORPHINE SULFATE 4 MG/ML IJ SOLN
2.0000 mg | Freq: Once | INTRAMUSCULAR | Status: AC
  Administered 2013-05-04: 2 mg via INTRAVENOUS
  Filled 2013-05-04: qty 1

## 2013-05-04 MED ORDER — ONDANSETRON HCL 4 MG/2ML IJ SOLN
4.0000 mg | Freq: Four times a day (QID) | INTRAMUSCULAR | Status: DC | PRN
  Filled 2013-05-04: qty 2

## 2013-05-04 MED ORDER — LIDOCAINE 5 % EX PTCH
3.0000 | MEDICATED_PATCH | CUTANEOUS | Status: DC
  Administered 2013-05-05: 3 via TRANSDERMAL
  Filled 2013-05-04 (×2): qty 3

## 2013-05-04 MED ORDER — SODIUM CHLORIDE 0.9 % IV SOLN
Freq: Once | INTRAVENOUS | Status: AC
  Administered 2013-05-04: 13:00:00 via INTRAVENOUS

## 2013-05-04 MED ORDER — METHYLPREDNISOLONE SODIUM SUCC 125 MG IJ SOLR
125.0000 mg | Freq: Once | INTRAMUSCULAR | Status: AC
  Administered 2013-05-04: 125 mg via INTRAVENOUS
  Filled 2013-05-04: qty 2

## 2013-05-04 MED ORDER — ALBUTEROL SULFATE (5 MG/ML) 0.5% IN NEBU
5.0000 mg | INHALATION_SOLUTION | Freq: Once | RESPIRATORY_TRACT | Status: AC
  Administered 2013-05-04: 5 mg via RESPIRATORY_TRACT
  Filled 2013-05-04: qty 0.5
  Filled 2013-05-04: qty 1

## 2013-05-04 MED ORDER — AMLODIPINE BESYLATE 5 MG PO TABS
5.0000 mg | ORAL_TABLET | Freq: Every day | ORAL | Status: DC
  Administered 2013-05-05: 5 mg via ORAL
  Filled 2013-05-04: qty 1

## 2013-05-04 MED ORDER — OXYBUTYNIN CHLORIDE 5 MG PO TABS
5.0000 mg | ORAL_TABLET | Freq: Every day | ORAL | Status: DC
  Administered 2013-05-05: 5 mg via ORAL
  Filled 2013-05-04: qty 1

## 2013-05-04 MED ORDER — MORPHINE SULFATE 4 MG/ML IJ SOLN
2.0000 mg | Freq: Once | INTRAMUSCULAR | Status: AC
  Administered 2013-05-04: 2 mg via INTRAVENOUS

## 2013-05-04 MED ORDER — ALBUTEROL SULFATE (5 MG/ML) 0.5% IN NEBU
INHALATION_SOLUTION | RESPIRATORY_TRACT | Status: AC
  Filled 2013-05-04: qty 0.5

## 2013-05-04 MED ORDER — IPRATROPIUM BROMIDE 0.02 % IN SOLN: 500.0000 ug | RESPIRATORY_TRACT | Status: DC | PRN

## 2013-05-04 MED ORDER — PREGABALIN 50 MG PO CAPS
75.0000 mg | ORAL_CAPSULE | Freq: Every day | ORAL | Status: DC
  Administered 2013-05-04: 75 mg via ORAL
  Filled 2013-05-04: qty 1

## 2013-05-04 MED ORDER — ENOXAPARIN SODIUM 40 MG/0.4ML ~~LOC~~ SOLN
40.0000 mg | SUBCUTANEOUS | Status: DC
  Filled 2013-05-04: qty 0.4

## 2013-05-04 MED ORDER — ONDANSETRON HCL 4 MG/2ML IJ SOLN
4.0000 mg | Freq: Once | INTRAMUSCULAR | Status: AC
  Administered 2013-05-04: 4 mg via INTRAVENOUS
  Filled 2013-05-04: qty 2

## 2013-05-04 MED ORDER — ENOXAPARIN SODIUM 40 MG/0.4ML ~~LOC~~ SOLN
40.0000 mg | SUBCUTANEOUS | Status: DC
  Administered 2013-05-04: 40 mg via SUBCUTANEOUS
  Filled 2013-05-04 (×2): qty 0.4

## 2013-05-04 MED ORDER — ONDANSETRON HCL 4 MG PO TABS: 4.0000 mg | ORAL_TABLET | Freq: Four times a day (QID) | ORAL | Status: DC | PRN

## 2013-05-04 MED ORDER — MONTELUKAST SODIUM 10 MG PO TABS
10.0000 mg | ORAL_TABLET | Freq: Every day | ORAL | Status: DC
  Administered 2013-05-05: 10 mg via ORAL
  Filled 2013-05-04: qty 1

## 2013-05-04 MED ORDER — GI COCKTAIL ~~LOC~~
30.0000 mL | Freq: Once | ORAL | Status: AC
  Administered 2013-05-04: 30 mL via ORAL
  Filled 2013-05-04: qty 30

## 2013-05-04 NOTE — H&P (Signed)
Family Medicine Teaching Service Attending Note  I interviewed and examined patient Laurie Horton and reviewed their tests and x-rays.  I discussed with Dr. Paulina Fusi and reviewed their note for today.  I agree with their assessment and plan.     Additionally  Feeling better Mild epig pain with palpation No unusual shortness of breath  No edema Last vomited hours ago No signs of ACS Ro for MI Notify Cardiology

## 2013-05-04 NOTE — ED Notes (Signed)
Per EMS pt was seen at urgent care for N/V/D, per EMS pt has a hx of afib, COPD, and inverted T waves. Per EMS, pt received 4 mg of zofran IM. Pt's 02 sats at urgent care were 83%, per EMS pt states this is her "normal" as she is always on 2L of 02 at home. Sent to ED by urgent care for low 02 sats.

## 2013-05-04 NOTE — H&P (Signed)
Family Medicine Teaching [facility] Admission History and Physical Service Pager: [phone]  Patient name: Laurie Horton Medical record number: [id number] Date of birth: [birth year] Age: 67 y.o. Gender: female  Primary Care Provider: Lucilla Edin, MD  Chief Complaint: chest pain, nausea/vomiting/epigastric pain   Assessment and Plan: Laurie Horton is a 67 y.o. year old female presenting with palpitations and chest pain accompanied by questionable T-wave inversion at her PCP's office.  Pt w/ epigastric pain as well as nausea/vomiting and hx of GERD.   # Chest pain: Atypical, pain is localized to her epigastric region and described as stabbing. It is not worsened by exertion or relieved with rest.  Most likely non cardiac as pt does not endorse radiating chest pressure worsened w/ activity or relieved by rest.  Differential includes GERD/Mallory-Weis Tear/Panic Attack/ACS/Aortic Dissection/PE.  Low likelihood of PE as wells score 1 (Defer D-Dimer at this time).  Most likely esophagitis or Mallory Weis Tear secondary to consistent vomiting.  However, TWI in V1-V4 that is possible change from previous EKG which could be anteriorseptal MI) - cycle troponin x3, POC troponin negative  - tele overnight - rpt EKG in am - GI cocktail, start protonix - Consult to Cardiology (Dr. [place], [place]) if they would evaluate while inpatient.   - Pt on ASA 325 mg qd from CVA in 2006, will continue with this. - Risk Stratification including Lipid Profile, A1C, TSH # Nausea/vomiting/diarhea: likely infectious gastroentiritis, improved with IV zofran in ED - got 1L bolus in ED, will give another then MIVF @ 125 cc/hr #HTN  -Hx of HTN on Toprol XL and Norvasc 5 mg qd  # UTI: asymptomatic bacteriuria, got rocephin in ED - UA with +LE, 11-20 WBCs and many bacteria - pt denies frequency, urgency, dysuria, will not tx further.  May be colonization  # COPD: on O2 at home, sats ~80% chronically - 1.5L [place],  uses intermittently at home - continue home atrovent - Continue home albuterol nebs PRN #Chronic Pain/Fibromyalgia -Continue home Lyrica, Flexeril, Vicodin  -PT/OT consult  #Depression -Continue home Trazadone and Pristiq #Hx of CVA -Continue ASA 325 mg # FEN/GI: NS @ 125 cc/hr, advance diet as tolerated w/ Heart Healthy, Protonix 40 mg qd # Prophylaxis: lovenox # Disposition: tele # Code Status: DNR  History of Present Illness: Laurie Horton is a 67 y.o. year old female presenting with palpitations and chest pain accompanied by questionable T-wave inversion at her PCP's office.  Pt went to see Dr. Cleta Alberts about one week ago(6/24) for shortness of breath, and at that time pt was found to have changes in her EKG.  Dr. Cleta Alberts called the pt after the visit and instructed her to f/u with her cardiologist (Dr. [place], [facility]).  However, pt continued to not feel normal and developed nausea/vomiting and worsening shortness of breath with some palpitations w/ her heart feeling "weird".  Her neighbor ended up checking on her today and recommended going back to see Dr. Cleta Alberts, at [facility] urgent care.  At the office visit today, pt continued to have increased shortness of breath and chest discomfort, and an EKG showed sinus tachycardia w/ TWI in V1 to V4 that looked changed from previous EKG.  At that time, he instructed the pt to go to the ED.  In the ED, pt had multiple evaluations performed including BMET which was WNL, WBC nml, POC troponin negative, UA w/ moderate LE, UCx pending, EKG unchanged from prior, CXR which was nml.  They gave her Morphine x 2 which helped with her back pain, a 1 L NS bolus, as well as Zofran, which improved some of her Sx.   Pt w/ significant smoking history of 2-2.5 ppd for 40 + yrs and quit in 2011 after she had a mild CVA and had VDRF.  She denies alcohol and substance use.  No fevers, chills, sweats, blood in vomit or stool, lower extremity edema, numbness,  tingling, weakness, dizziness, fatigue, HA, blurred vision, diplopia.      Patient Active Problem List   Diagnosis Date Noted  . Preoperative respiratory examination 05/02/2013  . Hyperglycemia, drug-induced 10/12/2011  . Encounter for long-term (current) use of medications 08/21/2011  . PAT (paroxysmal atrial tachycardia) 08/21/2011  . Right middle lobe pneumonia 07/24/2011  . Oral thrush 07/24/2011  . UPPER RESPIRATORY INFECTION, VIRAL 07/11/2010  . OTHER ACUTE SINUSITIS 06/04/2010  . PULMONARY NODULE, LEFT LOWER LOBE 10/14/2009  . PATENT FORAMEN OVALE 10/14/2009  . DEPRESSION 06/04/2009  . HYPERTENSION 06/04/2009  . C O P D 06/04/2009  . G E R D 06/04/2009  . SNORING, HX OF 06/04/2009   Past Medical History: Past Medical History  Diagnosis Date  . Depression   . GERD (gastroesophageal reflux disease)   . HTN (hypertension)   . Hiatal hernia   . COPD (chronic obstructive pulmonary disease)   . Diverticulitis   . DVT (deep venous thrombosis)   . Obesity   . Hyperlipidemia   . PFO (patent foramen ovale)   . PAT (paroxysmal atrial tachycardia)   . Right middle lobe pneumonia 07/24/2011    First noted at admit 07/10/11. Persists on cxr 07/22/11. Cleared on CT 08/24/11. No further followup  . PULMONARY NODULE, LEFT LOWER LOBE 10/14/2009    5mm LLL nodule dec 2010. Stable and 4mm in Oct 2012. No further fu  . TOBACCO ABUSE 06/04/2009  . Osteoporosis   . Fibromyalgia   . Stroke    Past Surgical History: Past Surgical History  Procedure Laterality Date  . Total abdominal hysterectomy      post op needed oxygen was told "she gave them a scare"  . Knee arthroscopy  2000    left  . Laparoscopic cholecystectomy  04-16-2010    [place]  . Tubal ligation    . US echocardiography  11/20/2009    EF 55-60%  . Cardiovascular stress test  12/26/2004    EF 74%. NO EVIDENCE OF ISCHEMIA   Social History: History  Substance Use Topics  . Smoking status: Former Smoker    Quit date:  06/02/2010  . Smokeless tobacco: Not on file  . Alcohol Use: No   For any additional social history documentation, please refer to relevant sections of EMR.  Family History: Family History  Problem Relation Age of Onset  . Dementia Mother   . Diabetes Mother   . Alzheimer's disease Mother   . Heart attack Brother 66  . Schizophrenia Sister   . Diabetes Sister   . Tremor Sister   . Heart attack Father    Allergies: Allergies  Allergen Reactions  . Alprazolam     REACTION: stops breathing  . Budesonide-Formoterol Fumarate     bliusters  . Avelox (Moxifloxacin Hcl In Nacl)   . Esomeprazole Magnesium     REACTION: "bouncing off walls"  . Esomeprazole Magnesium   . Eszopiclone     REACTION: "slept for a week"  . Eszopiclone   . Iodine     REACTION: swelling in  throat  . Loratadine     claritin D causes shaking  . Lotrimin (Clotrimazole)   . Oxcarbazepine Other (See Comments)    Causes deep sleep and dizziness  . Pseudoephedrine Hcl Er   . Zolpidem Tartrate     REACTION: "slept for a week"  . Bextra (Valdecoxib) Rash  . Ceclor (Cefaclor) Rash  . Covera-Hs (Verapamil Hcl) Palpitations  . Dicyclomine Hcl Rash  . Tessalon Perles Rash   No current facility-administered medications on file prior to encounter.   Current Outpatient Prescriptions on File Prior to Encounter  Medication Sig Dispense Refill  . albuterol (PROVENTIL) (2.5 MG/3ML) 0.083% nebulizer solution Take 2.5 mg by nebulization every 6 (six) hours as needed.      Marland Kitchen albuterol (VENTOLIN HFA) 108 (90 BASE) MCG/ACT inhaler Inhale 2 puffs into the lungs every 4 (four) hours as needed for wheezing.  8.5 Inhaler  11  . aspirin 325 MG tablet Take 325 mg by mouth daily.        . cyclobenzaprine (FLEXERIL) 10 MG tablet Take 10 mg by mouth 2 (two) times daily.       Marland Kitchen desvenlafaxine (PRISTIQ) 50 MG 24 hr tablet Take 50 mg by mouth daily.        . diazepam (VALIUM) 5 MG tablet Take 5 mg by mouth as needed for anxiety.        . fish oil-omega-3 fatty acids 1000 MG capsule Take 1 g by mouth daily.       Marland Kitchen glucosamine-chondroitin 500-400 MG tablet Take 1 tablet by mouth 3 (three) times daily.      Marland Kitchen HYDROcodone-acetaminophen (VICODIN) 5-500 MG per tablet Take 2 tablets by mouth 2 (two) times daily.       Marland Kitchen ipratropium (ATROVENT) 0.02 % nebulizer solution Take 500 mcg by nebulization as needed (thightness).       Marland Kitchen lidocaine (LIDODERM) 5 % Place 3 patches onto the skin daily. Remove & Discard patch within 12 hours or as directed by MD      . metoprolol succinate (TOPROL XL) 25 MG 24 hr tablet Take 0.5 tablets (12.5 mg total) by mouth daily.  15 tablet  3  . oxybutynin (DITROPAN) 5 MG tablet Take 5 mg by mouth daily.      . pregabalin (LYRICA) 75 MG capsule Take 75 mg by mouth daily.        . traZODone (DESYREL) 100 MG tablet Take 300 mg by mouth at bedtime.        . Vitamin D, Ergocalciferol, (DRISDOL) 50000 UNITS CAPS Take 50,000 Units by mouth every 7 (seven) days.       Review Of Systems: Per HPI with the following additions: pan positive ROS with diffuse pain from fibromyalgia and discomfort from recent GI illness. Otherwise 12 point review of systems was performed and was unremarkable.  Physical Exam: BP 126/79  Pulse 102  Temp(Src) 99.1 F (37.3 C) (Rectal)  Resp 15  SpO2 88%  Exam: General: NAD, alert and oriented HEENT: EOMI B/L, [place]/AT, Dry mucous membranes  Cardiovascular: Tachycardic, regular rhythm, no m/r/g Respiratory: nwob, decreased air movement bilaterally Abdomen: +BS, tenderness in epigastrum, nondistended, no masses Extremities: no edema, wwp Skin: no rashes Neuro: no focal deficits  Labs and Imaging: CBC BMET   Recent Labs Lab 05/04/13 1040  WBC 7.6  HGB 17.1*  HCT 46.8*  PLT 193    Recent Labs Lab 05/04/13 1040  NA 138  K 3.6  CL 98  CO2 24  BUN 14  CREATININE 1.06  GLUCOSE 143*  CALCIUM 9.8     I saw and examined the patient with Dr. Richarda Blade, please refer to  her excellent note above.  My additions are in RED.  Twana First Paulina Fusi, DO of [facility] 05/04/2013, 6:52 PM

## 2013-05-04 NOTE — Evaluation (Signed)
Physical Therapy Evaluation Patient Details Name: Laurie Horton MRN: [id number] DOB: 1946/09/10 Today's Date: 05/04/2013 Time: 5366-4403 PT Time Calculation (min): 19 min  PT Assessment / Plan / Recommendation History of Present Illness  Laurie Horton is a 67 y.o. year old female presenting with palpitations and chest pain accompanied by questionable T-wave inversion at her PCP's office.   Clinical Impression  Pt at baseline with mobility, no acute PT needs at this time    PT Assessment  Patent does not need any further PT services    Follow Up Recommendations  No PT follow up    Does the patient have the potential to tolerate intense rehabilitation      Barriers to Discharge        Equipment Recommendations       Recommendations for Other Services     Frequency      Precautions / Restrictions Precautions Precautions: None Restrictions Weight Bearing Restrictions: No   Pertinent Vitals/Pain 4/10 "discomfort'      Mobility  Bed Mobility Bed Mobility: Supine to Sit;Sitting - Scoot to Edge of Bed Supine to Sit: 7: Independent Sitting - Scoot to [place] of Bed: 7: Independent Transfers Transfers: Sit to Stand;Stand to Sit Sit to Stand: 7: Independent Stand to Sit: 7: Independent Ambulation/Gait Ambulation/Gait Assistance: 5: Supervision Ambulation Distance (Feet): 210 Feet Assistive device: None Ambulation/Gait Assistance Details: steady with gait, some fatigue noted and mild dizziness pt reports normal with morphine Gait Pattern: Within Functional Limits Gait velocity: WFL for community ambulation General Gait Details: steady    Exercises Total Joint Exercises Marching in Standing: 10 reps     PT Goals(Current goals can be found in the care plan section) Acute Rehab PT Goals PT Goal Formulation: No goals set, d/c therapy  Visit Information  Last PT Received On: 05/04/13 Assistance Needed: +1 History of Present Illness: Laurie Horton is a 67  y.o. year old female presenting with palpitations and chest pain accompanied by questionable T-wave inversion at her PCP's office.        Prior Functioning  Home Living Family/patient expects to be discharged to:: Private residence Living Arrangements: Alone Type of Home: Apartment Home Layout: Two level;1/2 bath on main level Home Equipment: Tub bench Prior Function Level of Independence: Independent with assistive device(s) Comments: pt uses a tub bench  Dominant Hand: Right    Cognition  Cognition Arousal/Alertness: Awake/alert Behavior During Therapy: WFL for tasks assessed/performed Overall Cognitive Status: Within Functional Limits for tasks assessed    Extremity/Trunk Assessment Upper Extremity Assessment Upper Extremity Assessment: RUE deficits/detail RUE Deficits / Details: Pt with history of rotator cuff injury.  Limited shoulder flexion to approximately 70 degrees, internal roatation to 60 degrees, elbow flexion limited AROM to 120 degrees approximately secondary to IV infiltrate in arm,  grasp and release WFLS RUE: Unable to fully assess due to pain Lower Extremity Assessment Lower Extremity Assessment: Overall WFL for tasks assessed Cervical / Trunk Assessment Cervical / Trunk Assessment: Normal   Balance High Level Balance High Level Balance Activites: Side stepping;Backward walking;Direction changes;Turns;Sudden stops;Head turns High Level Balance Comments: steady.  End of Session PT - End of Session Equipment Utilized During Treatment: Gait belt;Oxygen Activity Tolerance: Patient tolerated treatment well Patient left: in bed;Other (comment) (seated EOB with OT) Nurse Communication: Mobility status  GP     Fabio Asa 05/04/2013, 4:45 PM Charlotte Crumb, PT DPT  [phone]

## 2013-05-04 NOTE — ED Provider Notes (Signed)
Medical screening examination/treatment/procedure(s) were conducted as a shared visit with non-physician practitioner(s) and myself.  I personally evaluated the patient during the encounter  Pt with N/V/D for 2 days, has h/o COPD uses home O2 when needed, baseline O2 sat by her report is 83% without O2.  She had some upper epigastric and CP 2 days ago while vomiting and laying against the toilet in her home.  She denies syncope.  She saw PCP today who was concerned about some new ECG changes.  Pt has no sig abd pain otherwise, no CP, not more SOB than usual, has been using her nebulizer treatments at home.  Abdomen is soft to palpate, neg Murphy's. Pt is s/p cholecystectomy.  Pt is tachycardic here, freq ectopy, will check electrolytes.  Tachycardia is likely combination of using nebulizers, dehydration and stress.  If cannot tolerate PO's or VS remain abn after IVF hydration, persistent severe hypoxemia, pt will require admission.   Impression: Dehydration N/V/D Gastroenteritis COPD   Laurie Pound. Jeanpaul Biehl, MD 05/04/13 1414

## 2013-05-04 NOTE — ED Provider Notes (Signed)
History    CSN: [id number] Arrival date & time 05/04/13  0917  First MD Initiated Contact with Patient 05/04/13 [phone]     Chief Complaint  Patient presents with  . Shortness of Breath  . Vomiting  . Diarrhea   (Consider location/radiation/quality/duration/timing/severity/associated sxs/prior Treatment) HPI Laurie Horton is a 67 y.o. female who presents to ED with complaint of weakness, shortness of breath, nausea, vomiting, diarrhea for 2 days. Pt states symptoms all began with nausea, vomiting, water diarrhea. States today felt weaker, having increased shortness of breath, palpitations. States her neigbor came over to check on her this morning, and made her go to the Urgent care. There she was found to be tachycardic, have inverted  T waves, which she apparently have hx of, and she was transferred here. Pt states she has hx of severe COPD, states takes 1.5L of oxygen at night and on excertion only. Pt was found to have sats of 83% on RA at the UC. Pt denies chest pain at present, but states had pad 2 days ago, which she believes was from vomiting.  Past Medical History  Diagnosis Date  . Depression   . GERD (gastroesophageal reflux disease)   . HTN (hypertension)   . Hiatal hernia   . COPD (chronic obstructive pulmonary disease)   . Diverticulitis   . DVT (deep venous thrombosis)   . Obesity   . Hyperlipidemia   . PFO (patent foramen ovale)   . PAT (paroxysmal atrial tachycardia)   . Right middle lobe pneumonia 07/24/2011    First noted at admit 07/10/11. Persists on cxr 07/22/11. Cleared on CT 08/24/11. No further followup  . PULMONARY NODULE, LEFT LOWER LOBE 10/14/2009    5mm LLL nodule dec 2010. Stable and 4mm in Oct 2012. No further fu  . TOBACCO ABUSE 06/04/2009  . Osteoporosis   . Fibromyalgia   . Stroke    Past Surgical History  Procedure Laterality Date  . Total abdominal hysterectomy      post op needed oxygen was told "she gave them a scare"  . Knee arthroscopy   2000    left  . Laparoscopic cholecystectomy  04-16-2010    [facility]  . Tubal ligation    . US echocardiography  11/20/2009    EF 55-60%  . Cardiovascular stress test  12/26/2004    EF 74%. NO EVIDENCE OF ISCHEMIA   Family History  Problem Relation Age of Onset  . Dementia Mother   . Diabetes Mother   . Alzheimer's disease Mother   . Heart attack Brother 1  . Schizophrenia Sister   . Diabetes Sister   . Tremor Sister   . Heart attack Father    History  Substance Use Topics  . Smoking status: Former Smoker    Quit date: 06/02/2010  . Smokeless tobacco: Not on file  . Alcohol Use: No   OB History   Grav Para Term Preterm Abortions TAB SAB Ect Mult Living                 Review of Systems  Constitutional: Positive for fatigue. Negative for fever and chills.  HENT: Negative for neck pain and neck stiffness.   Respiratory: Positive for cough and shortness of breath. Negative for chest tightness.   Cardiovascular: Positive for palpitations. Negative for chest pain and leg swelling.  Gastrointestinal: Positive for nausea, vomiting and diarrhea. Negative for abdominal pain and blood in stool.  Genitourinary: Negative for dysuria and flank pain.  Neurological: Positive for dizziness, weakness and light-headedness.    Allergies  Alprazolam; Budesonide-formoterol fumarate; Avelox; Esomeprazole magnesium; Esomeprazole magnesium; Eszopiclone; Eszopiclone; Iodine; Loratadine; Lotrimin; Oxcarbazepine; Pseudoephedrine hcl er; Zolpidem tartrate; Bextra; Ceclor; Covera-hs; Dicyclomine hcl; and Tessalon perles  Home Medications   Current Outpatient Rx  Name  Route  Sig  Dispense  Refill  . albuterol (PROVENTIL) (2.5 MG/3ML) 0.083% nebulizer solution   Nebulization   Take 2.5 mg by nebulization every 6 (six) hours as needed.         Marland Kitchen albuterol (VENTOLIN HFA) 108 (90 BASE) MCG/ACT inhaler   Inhalation   Inhale 2 puffs into the lungs every 4 (four) hours as needed for wheezing.    8.5 Inhaler   11   . aspirin 325 MG tablet   Oral   Take 325 mg by mouth daily.           . Biotin 10 MG TABS   Oral   Take 1 tablet by mouth daily.         . cyclobenzaprine (FLEXERIL) 10 MG tablet   Oral   Take 10 mg by mouth 3 (three) times daily.          Marland Kitchen desvenlafaxine (PRISTIQ) 50 MG 24 hr tablet   Oral   Take 50 mg by mouth daily.           . diazepam (VALIUM) 5 MG tablet   Oral   Take 5 mg by mouth as needed.         . fish oil-omega-3 fatty acids 1000 MG capsule   Oral   Take 2 g by mouth daily.         Marland Kitchen glucosamine-chondroitin 500-400 MG tablet   Oral   Take 1 tablet by mouth 3 (three) times daily.         Marland Kitchen HYDROcodone-acetaminophen (NORCO/VICODIN) 5-325 MG per tablet      TAKE 1 TO 2 TABLETS EVERY 4 HOURS AS NEEDED FOR PAIN   30 tablet   0   . HYDROcodone-acetaminophen (VICODIN) 5-500 MG per tablet   Oral   Take 1 tablet by mouth 2 (two) times daily.          Marland Kitchen ipratropium (ATROVENT) 0.02 % nebulizer solution   Nebulization   Take 500 mcg by nebulization 2 (two) times daily.          Marland Kitchen lidocaine (LIDODERM) 5 %   Transdermal   Place 3 patches onto the skin daily. Remove & Discard patch within 12 hours or as directed by MD          . metoprolol succinate (TOPROL XL) 25 MG 24 hr tablet   Oral   Take 0.5 tablets (12.5 mg total) by mouth daily.   15 tablet   3   . montelukast (SINGULAIR) 10 MG tablet      TAKE 1 TABLET AT BEDTIME   30 tablet   4   . NORVASC 5 MG tablet      TAKE 1 TABLET (5 MG TOTAL) BY MOUTH DAILY.   30 tablet   5   . oxybutynin (DITROPAN) 5 MG tablet   Oral   Take 5 mg by mouth daily.         . pregabalin (LYRICA) 75 MG capsule   Oral   Take 75 mg by mouth daily.           . traZODone (DESYREL) 100 MG tablet   Oral   Take 300 mg by  mouth at bedtime.           . Vitamin D, Ergocalciferol, (DRISDOL) 50000 UNITS CAPS   Oral   Take 50,000 Units by mouth every 7 (seven) days.           BP 131/97  Pulse 112  Temp(Src) 98.5 F (36.9 C) (Oral)  Resp 23  SpO2 88% Physical Exam  Nursing note and vitals reviewed. Constitutional: She is oriented to person, place, and time. She appears well-developed and well-nourished.  Uncomfortable appearing  Eyes: Conjunctivae are normal. Pupils are equal, round, and reactive to light.  Neck: Neck supple.  Cardiovascular: Regular rhythm and normal heart sounds.   tachycardia  Pulmonary/Chest: Effort normal. No respiratory distress. She has no wheezes. She has no rales.  Decreased air movement bilaterally  Abdominal: Soft. Bowel sounds are normal. She exhibits no distension. There is no tenderness. There is no rebound and no guarding.  Musculoskeletal: She exhibits no edema.  Neurological: She is alert and oriented to person, place, and time.  Skin: Skin is warm and dry. No rash noted. No erythema.    ED Course  Procedures (including critical care time)  Results for orders placed during the hospital encounter of 05/04/13  CBC WITH DIFFERENTIAL      Result Value Range   WBC 7.6  4.0 - 10.5 K/uL   RBC 5.14 (*) 3.87 - 5.11 MIL/uL   Hemoglobin 17.1 (*) 12.0 - 15.0 g/dL   HCT 44.0 (*) 10.2 - 72.5 %   MCV 91.1  78.0 - 100.0 fL   MCH 33.3  26.0 - 34.0 pg   MCHC 36.5 (*) 30.0 - 36.0 g/dL   RDW 36.6  44.0 - 34.7 %   Platelets 193  150 - 400 K/uL   Neutrophils Relative % 70  43 - 77 %   Neutro Abs 5.3  1.7 - 7.7 K/uL   Lymphocytes Relative 18  12 - 46 %   Lymphs Abs 1.3  0.7 - 4.0 K/uL   Monocytes Relative 12  3 - 12 %   Monocytes Absolute 0.9  0.1 - 1.0 K/uL   Eosinophils Relative 1  0 - 5 %   Eosinophils Absolute 0.1  0.0 - 0.7 K/uL   Basophils Relative 0  0 - 1 %   Basophils Absolute 0.0  0.0 - 0.1 K/uL  COMPREHENSIVE METABOLIC PANEL      Result Value Range   Sodium 138  135 - 145 mEq/L   Potassium 3.6  3.5 - 5.1 mEq/L   Chloride 98  96 - 112 mEq/L   CO2 24  19 - 32 mEq/L   Glucose, Bld 143 (*) 70 - 99 mg/dL   BUN 14   6 - 23 mg/dL   Creatinine, Ser 4.25  0.50 - 1.10 mg/dL   Calcium 9.8  8.4 - 95.6 mg/dL   Total Protein 7.4  6.0 - 8.3 g/dL   Albumin 4.4  3.5 - 5.2 g/dL   AST 34  0 - 37 U/L   ALT 40 (*) 0 - 35 U/L   Alkaline Phosphatase 68  39 - 117 U/L   Total Bilirubin 0.5  0.3 - 1.2 mg/dL   GFR calc non Af Amer 53 (*) >90 mL/min   GFR calc Af Amer 62 (*) >90 mL/min  LIPASE, BLOOD      Result Value Range   Lipase 18  11 - 59 U/L  URINALYSIS, ROUTINE W REFLEX MICROSCOPIC  Result Value Range   Color, Urine AMBER (*) YELLOW   APPearance CLOUDY (*) CLEAR   Specific Gravity, Urine 1.028  1.005 - 1.030   pH 5.5  5.0 - 8.0   Glucose, UA 100 (*) NEGATIVE mg/dL   Hgb urine dipstick NEGATIVE  NEGATIVE   Bilirubin Urine MODERATE (*) NEGATIVE   Ketones, ur 15 (*) NEGATIVE mg/dL   Protein, ur 284 (*) NEGATIVE mg/dL   Urobilinogen, UA 0.2  0.0 - 1.0 mg/dL   Nitrite NEGATIVE  NEGATIVE   Leukocytes, UA MODERATE (*) NEGATIVE  URINE MICROSCOPIC-ADD ON      Result Value Range   Squamous Epithelial / LPF FEW (*) RARE   WBC, UA 11-20  <3 WBC/hpf   RBC / HPF 0-2  <3 RBC/hpf   Bacteria, UA MANY (*) RARE   Casts HYALINE CASTS (*) NEGATIVE   Urine-Other MUCOUS PRESENT    POCT I-STAT TROPONIN I      Result Value Range   Troponin i, poc 0.00  0.00 - 0.08 ng/mL   Comment 3            Dg Chest 2 View  05/04/2013   *RADIOLOGY REPORT*  Clinical Data: Short of breath.  Vomiting.  Diarrhea.  History of hypertension and COPD.  Smoking history.  CHEST - 2 VIEW  Comparison: 08/21/2012 and previous  Findings: The heart is mildly enlarged.  There is calcification and unfolding of the thoracic aorta.  There are chronically prominent interstitial lung markings in the lower lungs.  No evidence of active infiltrate, mass, effusion or collapse.  Ordinary degenerative changes affect the spine.  No acute bony finding.  IMPRESSION: No active process evident.  Mild cardiac enlargement.  Unfolded aorta.  Chronic interstitial lung  markings at the bases.   Original Report Authenticated By: Paulina Fusi, M.D.   Dg Abd 2 Views  05/04/2013   *RADIOLOGY REPORT*  Clinical Data: Abdominal discomfort.  Vomiting.  Diarrhea. Shortness of breath.  ABDOMEN - 2 VIEW  Comparison: CT abdomen 03/29/2013  Findings: Bowel gas pattern is within normal limits without evidence of ileus, obstruction or free air.  Clips in the right upper quadrant indicate previous cholecystectomy.  Arterial calcification is noted.  There is curvature and chronic degenerative change of the spine.  IMPRESSION: No acute or significant abdominal finding.  Gas pattern within normal limits.   Original Report Authenticated By: Paulina Fusi, M.D.     Date: 05/04/2013  Rate: 115  Rhythm: sinus tachycardia  QRS Axis: right  Intervals: normal  ST/T Wave abnormalities: nonspecific T wave changes  Conduction Disutrbances:none  Narrative Interpretation:   Old EKG Reviewed: unchanged   No results found. No diagnosis found.  MDM  Pt sent here from Dr. Ellis Parents office with hypoxia, tachycardia, n/v/d, weakness. Pt does have hx of advance COPD, record review showed that she has been hypoxic on prior office visits, followed by Dr. Marchelle Gearing. PT denies any current chest pain, shortness of breath at baseline. No abdominal tenderness, no acute abdomen on exam. Pt continuously conmplaining about her chronic back pain. She has received nebs and solumedrol in ED. PE considered but given her known severe pulmonary disease will try to treat that first.   12:47 PM Pt not improving with nebs, oxygen in low 80s on 3-4L [place], she is persistently tachycardic. Appears dry. UTI. Will admit.   Spoke with family practice, will admit.   Filed Vitals:   05/04/13 1109 05/04/13 1109 05/04/13 1200 05/04/13 1233  BP:  116/82 116/82 105/67 130/73  Pulse: 120 113 89 106  Temp:  99.1 F (37.3 C)    TempSrc:  Rectal    Resp:  16 21 18   SpO2:  87% 83% 88%      Lottie Mussel,  PA-C 05/04/13 1528

## 2013-05-04 NOTE — Progress Notes (Signed)
  Subjective:    Patient ID: Laurie Horton, female    DOB: June 20, 1946, 67 y.o.   MRN: [id number]  HPI patient seen emergently for shortness of breath difficulty breathing nausea vomiting and diarrhea. Apparently on Tuesday night she developed an episode of nausea and vomiting. This was followed by diarrhea. She had some discomfort in her chest on Wednesday. She was too sick to come to the doctor and so presented this morning with difficulty breathing abdominal pain and inability to lay flat.    Review of Systems     Objective:   Physical Exam patient enters in respiratory distress. Her neck is supple. Her skin is somewhat diaphoretic. Her chest has rales to mid lung bilaterally. Cardiac exam reveals a tachycardia with irregularity. The abdomen is protuberant but there are no masses palpable and I did not detect any acute areas of tenderness   EKG shows a sinus tachycardia with T wave inversion V1 to V4 progressive since her EKG done 10 days ago.      Assessment & Plan:  I do have concerns about coronary disease. Her major complaint is vomiting and diarrhea. She does have rales to mid lung bilaterally. Her initial pulse ox was in the mid 80s. She is tachycardic with a rate at 120. Dr. Peter [place]  was called and he is aware of patient coming to the emergency room. Patient also has a lung specialist Dr. Marchelle Gearing. It is also possible that this is a GI issue and she is seeing Dr. Randa Evens in the past for diverticulosis .

## 2013-05-04 NOTE — Evaluation (Signed)
Occupational Therapy Evaluation Patient Details Name: Laurie Horton MRN: [id number] DOB: [birth year] Today's Date: 05/04/2013 Time: 1550-1610 OT Time Calculation (min): 20 min  OT Assessment / Plan / Recommendation History of present illness Laurie Horton is a 67 y.o. year old female presenting with palpitations and chest pain accompanied by questionable T-wave inversion at her PCP's office.    Clinical Impression   Pt is overall modified independent to independent for all selfcare tasks and functional mobility.  She already has all DME and is at her current functional baseline for ADLS.  No further OT needs at this time.  O2 sats were at 82-84 % on 2Ls sitting EOB as well.  She reports this is her baseline as well.  May need to be further addressed by MD if this is a safe level or not.    OT Assessment  Patient does not need any further OT services    Follow Up Recommendations  No OT follow up       Equipment Recommendations  None recommended by OT          Precautions / Restrictions Precautions Precautions: None Restrictions Weight Bearing Restrictions: No   Pertinent Vitals/Pain O2 sats 82-84% on 2Ls at rest    ADL  Eating/Feeding: Simulated;Independent Where Assessed - Eating/Feeding: Edge of bed Grooming: Simulated;Independent Where Assessed - Grooming: Unsupported standing Upper Body Bathing: Simulated;Modified independent Where Assessed - Upper Body Bathing: Unsupported sitting Lower Body Bathing: Simulated;Modified independent Where Assessed - Lower Body Bathing: Unsupported sitting Upper Body Dressing: Simulated;Modified independent Where Assessed - Upper Body Dressing: Unsupported sitting Lower Body Dressing: Simulated;Modified independent Where Assessed - Lower Body Dressing: Unsupported sit to stand Toilet Transfer: Performed;Modified independent Toilet Transfer Method: Other (comment) (ambulating without assistive device.) Toilet Transfer Equipment:  Comfort height toilet;Grab bars Toileting - Clothing Manipulation and Hygiene: Simulated;Modified independent Where Assessed - Toileting Clothing Manipulation and Hygiene: Sit to stand from 3-in-1 or toilet Tub/Shower Transfer Method: Not assessed Transfers/Ambulation Related to ADLs: Pt overall independent with mobility without use of assistive device.   ADL Comments: Pt has a tub bench at home and has been O2 dependent.  Her current O2 sats are at 82-83% on 2 Ls nasal cannula.  She reports this is her normal baseline.  She reports notifying her neighbor if she is going to take a shower just in case and slides down the steps on her bottom  to get from her bedrom and shower to the living area.  Discussed use of luke warm water to help prevent build up of steam during shower, to help with breathing.  Also educated on AROM exercises for her scapula to help increase posture and maintain AROM in the right shoulder since she is not a candidate for surgery.        Visit Information  Last OT Received On: 05/04/13 Assistance Needed: +1 History of Present Illness: Laurie Horton is a 67 y.o. year old female presenting with palpitations and chest pain accompanied by questionable T-wave inversion at her PCP's office.        Prior Functioning     Home Living Family/patient expects to be discharged to:: Private residence Living Arrangements: Alone Type of Home: Apartment Home Layout: Two level;1/2 bath on main level Home Equipment: Tub bench Prior Function Level of Independence: Independent with assistive device(s) Comments: pt uses a tub bench  Dominant Hand: Right         Vision/Perception Vision - History Baseline Vision: Other (comment) (right visual field  deficit) Patient Visual Report: No change from baseline Vision - Assessment Eye Alignment: Within Functional Limits Vision Assessment: Vision not tested Perception Perception: Within Functional Limits Praxis Praxis: Intact    Cognition  Cognition Arousal/Alertness: Awake/alert Behavior During Therapy: WFL for tasks assessed/performed Overall Cognitive Status: Within Functional Limits for tasks assessed    Extremity/Trunk Assessment Upper Extremity Assessment Upper Extremity Assessment: RUE deficits/detail RUE Deficits / Details: Pt with history of rotator cuff injury.  Limited shoulder flexion to approximately 70 degrees, internal roatation to 60 degrees, elbow flexion limited AROM to 120 degrees approximately secondary to IV infiltrate in arm,  grasp and release WFLS RUE: Unable to fully assess due to pain Lower Extremity Assessment Lower Extremity Assessment: Overall WFL for tasks assessed Cervical / Trunk Assessment Cervical / Trunk Assessment: Normal     Mobility Bed Mobility Bed Mobility: Supine to Sit;Sitting - Scoot to Edge of Bed Supine to Sit: 7: Independent Sitting - Scoot to [place] of Bed: 7: Independent Transfers Transfers: Sit to Stand;Stand to Sit Sit to Stand: 7: Independent Stand to Sit: 7: Independent     Exercise Total Joint Exercises Marching in Standing: 10 reps   Balance Balance Balance Assessed: Yes Dynamic Standing Balance Dynamic Standing - Balance Support: No upper extremity supported Dynamic Standing - Level of Assistance: 7: Independent High Level Balance High Level Balance Activites: Side stepping;Backward walking;Direction changes;Turns;Sudden stops;Head turns High Level Balance Comments: steady.   End of Session OT - End of Session Activity Tolerance: Patient tolerated treatment well Patient left: in bed;with call bell/phone within reach;with family/visitor present Nurse Communication: Mobility status     Margaretta Chittum OTR/L Pager number [phone] 05/04/2013, 4:51 PM

## 2013-05-04 NOTE — ED Notes (Signed)
Chem 8 results called to RN, [place]

## 2013-05-04 NOTE — ED Notes (Signed)
PA & RN at bedside 

## 2013-05-04 NOTE — Progress Notes (Signed)
Spoke with MD ok with pts o2 sats 85-88% on 1.5L [place]; pt breathing fine, pt states she's 83% on RA at home

## 2013-05-05 DIAGNOSIS — R0789 Other chest pain: Secondary | ICD-10-CM

## 2013-05-05 DIAGNOSIS — E860 Dehydration: Secondary | ICD-10-CM

## 2013-05-05 LAB — BASIC METABOLIC PANEL
CO2: 20 mEq/L (ref 19–32)
Calcium: 8.5 mg/dL (ref 8.4–10.5)
Creatinine, Ser: 0.66 mg/dL (ref 0.50–1.10)
Glucose, Bld: 151 mg/dL — ABNORMAL HIGH (ref 70–99)

## 2013-05-05 LAB — TSH: TSH: 0.966 u[IU]/mL (ref 0.350–4.500)

## 2013-05-05 LAB — CBC
MCH: 32.4 pg (ref 26.0–34.0)
MCV: 92.3 fL (ref 78.0–100.0)
Platelets: 149 10*3/uL — ABNORMAL LOW (ref 150–400)
RDW: 13.2 % (ref 11.5–15.5)

## 2013-05-05 LAB — HEMOGLOBIN A1C
Hgb A1c MFr Bld: 5.8 % — ABNORMAL HIGH (ref <5.7)
Mean Plasma Glucose: 120 mg/dL — ABNORMAL HIGH (ref <117)

## 2013-05-05 NOTE — Discharge Summary (Signed)
Family Medicine Teaching [facility] Discharge Summary  Patient name: Laurie Horton Medical record number: [id number] Date of birth: 1946-06-13 Age: 67 y.o. Gender: female Date of Admission: 05/04/2013  Date of Discharge: 05/05/13  Admitting Physician: Carney Living, MD  Primary Care Provider: Lucilla Edin, MD Consultants: None   Indication for Hospitalization:   Presented with nausea, vomiting x3 days with stabbing epigastric and chest pain concerning for possible coronary ischemia admitted for ACS rule out.   Discharge Diagnoses/Problem List:   Chest Pain Nausea/Vomiting Hiatal hernia COPD, on home oxygen Asymptomatic bacteriuria Fibromyalgia Depression  Disposition: D/C Home  Discharge Condition: Stable  Brief Hospital Course:  Laurie Horton is a 67 y.o. year old female presenting with palpitations and chest pain accompanied by questionable T-wave inversion at her PCP's office, as well as nausea, vomiting, with h/o GERD and hiatal hernia. She was admitted and cardiac etiologies to her presentation were excluded, and she was discharged in stable condition with chest pain, nausea, and vomiting having abated.  # Chest pain: Atypical, pain is localized to her epigastric region and described as stabbing. It is not worsened by exertion or relieved with rest. TWI was present in precordial leads, with stable ECG and no telemetry events throughout admission. Pt known to Dr. Swaziland, seen for PFO, PAT, CABG x4 2012. Consult was deferred to outpatient follow-up due to negative work-up and clinical improvement from measures targeting GI etiology.  # Nausea/vomiting/diarrhea: likely infectious gastroentiritis vs. Hiatal hernia irritation, improved with IV zofran. Chest pain was relieved after GI cocktail. Has seen Dr. Randa Evens in the past for diverticulosis and chronic abdominal pain.  # COPD: on 1.5 L O2 by [place] intermittently at home, sats ~80% chronically, continued home  atrovent, albuterol nebs prn and pt did well on room air. Pt had one desaturation event <85% during exertion with quick resolution after rest and oxygen via  for a few minutes. Discharged on home meds.   # UTI: asymptomatic bacteriuria, got rocephin in ED with no other tx.   # chronic pain/fibromyalgia. PT/OT was consulted and recommend no further required f/u and we continuing home lyrica, flexiril, and norco.  # Depression. Continued home trazodone and pristiq  Issues for Follow Up:  1) Chronic cardiac and pulmonary disease management  Significant Procedures: None  Significant Labs and Imaging:   Recent Labs Lab 05/04/13 1040 05/04/13 1625 05/05/13 0400  WBC 7.6 5.9 5.3  HGB 17.1* 15.4* 13.4  HCT 46.8* 43.4 38.2  PLT 193 181 149*    Recent Labs Lab 05/04/13 1040 05/04/13 1625 05/05/13 0400  NA 138  --  136  K 3.6  --  3.8  CL 98  --  104  CO2 24  --  20  GLUCOSE 143*  --  151*  BUN 14  --  12  CREATININE 1.06 0.89 0.66  CALCIUM 9.8  --  8.5  ALKPHOS 68  --   --   AST 34  --   --   ALT 40*  --   --   ALBUMIN 4.4  --   --    7/3 ECG: WANDERING PACEMAKER ~ varying PR interval & P axis RIGHT AXIS DEVIATION ~ QRS axis ( 91,269) BORDERLINE T ABNORMALITIES, DIFFUSE LEADS ~ T flat/neg 7/4 ECG: NSR Unchanged from 7/3. No acute ischemia.  Outstanding Results: None  Discharge Medications:    Medication List    STOP taking these medications       Vitamin D (Ergocalciferol)  50000 UNITS Caps  Commonly known as:  DRISDOL      TAKE these medications       albuterol (2.5 MG/3ML) 0.083% nebulizer solution  Commonly known as:  PROVENTIL  Take 2.5 mg by nebulization every 6 (six) hours as needed.     albuterol 108 (90 BASE) MCG/ACT inhaler  Commonly known as:  VENTOLIN HFA  Inhale 2 puffs into the lungs every 4 (four) hours as needed for wheezing.     amLODipine 5 MG tablet  Commonly known as:  NORVASC  Take 5 mg by mouth daily.     aspirin 325 MG tablet   Take 325 mg by mouth daily.     cyclobenzaprine 10 MG tablet  Commonly known as:  FLEXERIL  Take 10 mg by mouth 2 (two) times daily.     diazepam 5 MG tablet  Commonly known as:  VALIUM  Take 5 mg by mouth as needed for anxiety.     fish oil-omega-3 fatty acids 1000 MG capsule  Take 1 g by mouth daily.     glucosamine-chondroitin 500-400 MG tablet  Take 1 tablet by mouth 3 (three) times daily.     HYDROcodone-acetaminophen 5-500 MG per tablet  Commonly known as:  VICODIN  Take 2 tablets by mouth 2 (two) times daily.     ipratropium 0.02 % nebulizer solution  Commonly known as:  ATROVENT  Take 500 mcg by nebulization as needed (thightness).     lidocaine 5 %  Commonly known as:  LIDODERM  Place 3 patches onto the skin daily. Remove & Discard patch within 12 hours or as directed by MD     metoprolol succinate 25 MG 24 hr tablet  Commonly known as:  TOPROL XL  Take 0.5 tablets (12.5 mg total) by mouth daily.     montelukast 10 MG tablet  Commonly known as:  SINGULAIR  Take 10 mg by mouth daily.     oxybutynin 5 MG tablet  Commonly known as:  DITROPAN  Take 5 mg by mouth daily.     pregabalin 75 MG capsule  Commonly known as:  LYRICA  Take 75 mg by mouth daily.     PRISTIQ 50 MG 24 hr tablet  Generic drug:  desvenlafaxine  Take 50 mg by mouth daily.     traZODone 100 MG tablet  Commonly known as:  DESYREL  Take 300 mg by mouth at bedtime.        Discharge Instructions: Please refer to Patient Instructions section of EMR for full details.  Patient was counseled important signs and symptoms that should prompt return to medical care, changes in medications, dietary instructions, activity restrictions, and follow up appointments.   Follow-Up Appointments:     Follow-up Information   Schedule an appointment as soon as possible for a visit with Lucilla Edin, MD.   Contact information:   [address] [place] [address] [phone]       Follow up with  Peter Swaziland, MD. Schedule an appointment as soon as possible for a visit in 3 days.   Contact information:   [address] [place] [address] [phone]       Hazeline Junker, MD 05/05/2013, 10:23 PM PGY-1, [facility] Family [facility]

## 2013-05-05 NOTE — Progress Notes (Signed)
Family Medicine Teaching Service Attending Note  I interviewed and examined patient Laurie Horton and reviewed their tests and x-rays.  I discussed with Dr. Jarvis Newcomer and reviewed their note for today.  I agree with their assessment and plan.     Additionally  Feels well No chest pain currently or signs of ACS Stable for discharge

## 2013-05-05 NOTE — Progress Notes (Signed)
Family Medicine Teaching Service Daily Progress Note Intern Pager: [phone]  Patient name: Laurie Horton Medical record number: [id number] Date of birth: [birth year] Age: 67 y.o. Gender: female  Primary Care Provider: Lucilla Edin, MD Consultants: Dr. Sw[place], [facility] Code Status: DNR  Pt Overview and Major Events to Date:   7/3: Admitted with epigastric/chest pain for ACS r/o 7/4: ACS ruled out, D/C home, cardiologist followup as outpatient.   Assessment and Plan:  # Chest pain: Atypical, pain is localized to her epigastric region and described as stabbing. It is not worsened by exertion or relieved with rest. -TWI in precordial leads - troponins neg x3 - tele overnight  - rpt EKG in am  - GI cocktail, start protonix  - Cardiology consult known to Dr. Swaziland, seen for PFO, PAT, CABG x4 2012  # Nausea/vomiting/diarhea: likely infectious gastroentiritis, improved with IV zofran in ED  - Improved  # COPD: on O2 at home, sats ~80% chronically  - 1.5L [place], uses intermittently at home  - continue home atrovent, albuterol nebs prn  # UTI: asymptomatic bacteriuria, got rocephin in ED  - UA with +LE, 11-20 WBCs and many bacteria  - pt denies frequency, urgency, dysuria - No further tx  # chronic pain/fibromyalgia - PT/OT recommend no further required f/u - Continuing home lyrica, flexiril norco  #Depression - Continuing home trazodone and pristiq  FEN/GI: NS @ 125cc/hr, heart healthy diet PPx: Lovenox, Protonix  Disposition: Telemetry floor in stable condition, D/C today  Subjective: Pt at baseline SOB, no CP at this time.   Objective: Temp:  [97.6 F (36.4 C)-99.1 F (37.3 C)] 97.6 F (36.4 C) (07/04 0504) Pulse Rate:  [67-120] 67 (07/04 0504) Resp:  [15-23] 16 (07/04 0504) BP: (94-131)/(60-97) 109/60 mmHg (07/04 0504) SpO2:  [75 %-91 %] 88 % (07/04 0504) Weight:  [206 lb 3.2 oz (93.532 kg)] 206 lb 3.2 oz (93.532 kg) (07/04 0504) Physical  Exam: General: NAD, alert and oriented  Cardiovascular: RRR, no m/r/g, no edema Respiratory: nwob, decreased air movement bilaterally  Abdomen: +BS, tenderness in epigastrum, nondistended, no masses  Extremities: no edema, wwp  Skin: no rashes  Neuro: no focal deficits   Laboratory:  Recent Labs Lab 05/04/13 1040 05/04/13 1625 05/05/13 0400  WBC 7.6 5.9 5.3  HGB 17.1* 15.4* 13.4  HCT 46.8* 43.4 38.2  PLT 193 181 149*    Recent Labs Lab 05/04/13 1040 05/04/13 1625 05/05/13 0400  NA 138  --  136  K 3.6  --  3.8  CL 98  --  104  CO2 24  --  20  BUN 14  --  12  CREATININE 1.06 0.89 0.66  CALCIUM 9.8  --  8.5  PROT 7.4  --   --   BILITOT 0.5  --   --   ALKPHOS 68  --   --   ALT 40*  --   --   AST 34  --   --   GLUCOSE 143*  --  151*    Troponins negative x3  Imaging/Diagnostic Tests: ECG Unchanged from 7/3. No acute ischemia.  Hazeline Junker, MD 05/05/2013, 9:27 AM PGY-1, [facility] FPTS Intern pager: [phone], text pages welcome

## 2013-05-06 NOTE — Discharge Summary (Signed)
I have reviewed this discharge summary and agree.    

## 2013-05-07 LAB — URINE CULTURE: Colony Count: >=100000

## 2013-05-08 ENCOUNTER — Telehealth: Payer: Self-pay | Admitting: Cardiology

## 2013-05-08 NOTE — Telephone Encounter (Signed)
New Problem:    Patient called in to schedule an EPH within the next 2-3 days per her AVS report.  Please call back.

## 2013-05-08 NOTE — Telephone Encounter (Signed)
Returned call to patient no answer.Unable to leave a voice message voice mail box not set up.

## 2013-05-09 NOTE — Telephone Encounter (Signed)
Patient called she stated she was recently in hospital and was told to make appointment with Dr.Jordan in 3 days.Appointment scheduled with Dr.Jordan 05/10/13.

## 2013-05-10 ENCOUNTER — Encounter: Payer: Self-pay | Admitting: Cardiology

## 2013-05-10 ENCOUNTER — Ambulatory Visit (INDEPENDENT_AMBULATORY_CARE_PROVIDER_SITE_OTHER): Payer: Medicare Other | Admitting: Cardiology

## 2013-05-10 VITALS — BP 110/78 | HR 64 | Ht 64.0 in | Wt 179.0 lb

## 2013-05-10 DIAGNOSIS — I10 Essential (primary) hypertension: Secondary | ICD-10-CM

## 2013-05-10 DIAGNOSIS — R9431 Abnormal electrocardiogram [ECG] [EKG]: Secondary | ICD-10-CM

## 2013-05-10 DIAGNOSIS — R0789 Other chest pain: Secondary | ICD-10-CM

## 2013-05-10 NOTE — Progress Notes (Signed)
Laurie Horton Date of Birth: 1946/05/08 Medical Record #[id number]  History of Present Illness: Laurie Horton is seen for evaluation of an abnormal ECG. She has a history of severe COPD.  She  had a stroke after knee surgery in 2006. Evaluation at that time revealed a PFO by TEE. She has been maintained on aspirin therapy.  The only prior ischemic evaluation she had included normal nuclear stress test in 2002 and 2006. She has no symptoms of angina. She does have chronic shortness of breath. Recently she was seen by Dr. Cleta Alberts. ECG revealed increased T-wave inversion in the anterior precordial leads. She apparently is going to require lumbar surgery in the near future. This past week she was hospitalized with an acute gastroenteritis with projectile vomiting and diarrhea. This is was associated with some epigastric pain. Prior to this she has had some atypical chest pain which starts in the center of her chest and then shoots to the left side.  Current Outpatient Prescriptions on File Prior to Visit  Medication Sig Dispense Refill  . albuterol (PROVENTIL) (2.5 MG/3ML) 0.083% nebulizer solution Take 2.5 mg by nebulization every 6 (six) hours as needed.      Marland Kitchen albuterol (VENTOLIN HFA) 108 (90 BASE) MCG/ACT inhaler Inhale 2 puffs into the lungs every 4 (four) hours as needed for wheezing.  8.5 Inhaler  11  . amLODipine (NORVASC) 5 MG tablet Take 5 mg by mouth daily.      Marland Kitchen aspirin 325 MG tablet Take 325 mg by mouth daily.        . cyclobenzaprine (FLEXERIL) 10 MG tablet Take 10 mg by mouth 2 (two) times daily.       Marland Kitchen desvenlafaxine (PRISTIQ) 50 MG 24 hr tablet Take 50 mg by mouth daily.        . diazepam (VALIUM) 5 MG tablet Take 5 mg by mouth as needed for anxiety.       . fish oil-omega-3 fatty acids 1000 MG capsule Take 1 g by mouth daily.       Marland Kitchen glucosamine-chondroitin 500-400 MG tablet Take 1 tablet by mouth 3 (three) times daily.      Marland Kitchen HYDROcodone-acetaminophen (VICODIN) 5-500 MG per tablet  Take 2 tablets by mouth 2 (two) times daily.       Marland Kitchen ipratropium (ATROVENT) 0.02 % nebulizer solution Take 500 mcg by nebulization as needed (thightness).       Marland Kitchen lidocaine (LIDODERM) 5 % Place 3 patches onto the skin as needed. Remove & Discard patch within 12 hours or as directed by MD      . metoprolol succinate (TOPROL XL) 25 MG 24 hr tablet Take 0.5 tablets (12.5 mg total) by mouth daily.  15 tablet  3  . montelukast (SINGULAIR) 10 MG tablet Take 10 mg by mouth daily.      Marland Kitchen oxybutynin (DITROPAN) 5 MG tablet Take 5 mg by mouth daily.      . pregabalin (LYRICA) 75 MG capsule Take 75 mg by mouth daily.        . traZODone (DESYREL) 100 MG tablet Take 300 mg by mouth at bedtime.         No current facility-administered medications on file prior to visit.    Allergies  Allergen Reactions  . Alprazolam     REACTION: stops breathing  . Budesonide-Formoterol Fumarate     bliusters  . Avelox (Moxifloxacin Hcl In Nacl)   . Esomeprazole Magnesium     REACTION: "bouncing off walls"  .  Esomeprazole Magnesium   . Eszopiclone     REACTION: "slept for a week"  . Eszopiclone   . Iodine     REACTION: swelling in throat  . Loratadine     claritin D causes shaking  . Lotrimin (Clotrimazole)   . Oxcarbazepine Other (See Comments)    Causes deep sleep and dizziness  . Pseudoephedrine Hcl Er   . Zolpidem Tartrate     REACTION: "slept for a week"  . Bextra (Valdecoxib) Rash  . Ceclor (Cefaclor) Rash  . Covera-Hs (Verapamil Hcl) Palpitations  . Dicyclomine Hcl Rash  . Tessalon Perles Rash    Past Medical History  Diagnosis Date  . Depression   . GERD (gastroesophageal reflux disease)   . HTN (hypertension)   . Hiatal hernia   . COPD (chronic obstructive pulmonary disease)   . Diverticulitis   . DVT (deep venous thrombosis)   . Obesity   . Hyperlipidemia   . PFO (patent foramen ovale)   . PAT (paroxysmal atrial tachycardia)   . Right middle lobe pneumonia 07/24/2011    First noted  at admit 07/10/11. Persists on cxr 07/22/11. Cleared on CT 08/24/11. No further followup  . PULMONARY NODULE, LEFT LOWER LOBE 10/14/2009    5mm LLL nodule dec 2010. Stable and 4mm in Oct 2012. No further fu  . TOBACCO ABUSE 06/04/2009  . Osteoporosis   . Fibromyalgia   . Stroke     Past Surgical History  Procedure Laterality Date  . Total abdominal hysterectomy      post op needed oxygen was told "she gave them a scare"  . Knee arthroscopy  2000    left  . Laparoscopic cholecystectomy  04-16-2010    [facility]  . Tubal ligation    . US echocardiography  11/20/2009    EF 55-60%  . Cardiovascular stress test  12/26/2004    EF 74%. NO EVIDENCE OF ISCHEMIA    History  Smoking status  . Former Smoker  . Quit date: 06/02/2010  Smokeless tobacco  . Not on file    History  Alcohol Use No    Family History  Problem Relation Age of Onset  . Dementia Mother   . Diabetes Mother   . Alzheimer's disease Mother   . Heart attack Brother 57  . Schizophrenia Sister   . Diabetes Sister   . Tremor Sister   . Heart attack Father     Review of Systems: As noted in history of present illness..  All other systems were reviewed and are negative.  Physical Exam: BP 110/78  Pulse 64  Ht 5\' 4"  (1.626 m)  Wt 179 lb (81.194 kg)  BMI 30.71 kg/m2 She is an obese white female who appears chronically ill. Her HEENT exam is unremarkable. She has no JVD or bruits. Lungs are clear. Cardiac exam reveals a regular rate and rhythm without gallop, murmur, or click. Abdomen is obese, soft, nontender. She has no edema. Pedal pulses are good. She is alert and oriented x3. Cranial nerves II through XII are intact. LABORATORY DATA: ECG demonstrates normal sinus rhythm with T-wave inversion in the anterior precordial leads.  Assessment / Plan: 1. Abnormal ECG. No clear-cut symptoms of angina. It has been 8 years since her last ischemic evaluation. Since her T-wave inversion is more prominent I recommended a  Lexiscan Myoview study. If normal we can reassure her and clear her for lumbar surgery. 2. PFO 3. COPD 4. Hypertension 5. Recent gastroenteritis.

## 2013-05-10 NOTE — Patient Instructions (Signed)
We will schedule you for a Lexiscan Myoview study.

## 2013-05-16 ENCOUNTER — Ambulatory Visit (HOSPITAL_COMMUNITY): Payer: Medicare Other | Attending: Cardiovascular Disease | Admitting: Radiology

## 2013-05-16 VITALS — BP 114/78 | HR 101 | Ht 64.0 in | Wt 198.0 lb

## 2013-05-16 DIAGNOSIS — E669 Obesity, unspecified: Secondary | ICD-10-CM | POA: Insufficient documentation

## 2013-05-16 DIAGNOSIS — Z8249 Family history of ischemic heart disease and other diseases of the circulatory system: Secondary | ICD-10-CM | POA: Insufficient documentation

## 2013-05-16 DIAGNOSIS — R0602 Shortness of breath: Secondary | ICD-10-CM | POA: Insufficient documentation

## 2013-05-16 DIAGNOSIS — R1110 Vomiting, unspecified: Secondary | ICD-10-CM | POA: Insufficient documentation

## 2013-05-16 DIAGNOSIS — Z8673 Personal history of transient ischemic attack (TIA), and cerebral infarction without residual deficits: Secondary | ICD-10-CM | POA: Insufficient documentation

## 2013-05-16 DIAGNOSIS — R42 Dizziness and giddiness: Secondary | ICD-10-CM | POA: Insufficient documentation

## 2013-05-16 DIAGNOSIS — I779 Disorder of arteries and arterioles, unspecified: Secondary | ICD-10-CM | POA: Insufficient documentation

## 2013-05-16 DIAGNOSIS — R0789 Other chest pain: Secondary | ICD-10-CM

## 2013-05-16 DIAGNOSIS — R9431 Abnormal electrocardiogram [ECG] [EKG]: Secondary | ICD-10-CM | POA: Insufficient documentation

## 2013-05-16 DIAGNOSIS — R000 Tachycardia, unspecified: Secondary | ICD-10-CM | POA: Insufficient documentation

## 2013-05-16 DIAGNOSIS — R0989 Other specified symptoms and signs involving the circulatory and respiratory systems: Secondary | ICD-10-CM | POA: Insufficient documentation

## 2013-05-16 DIAGNOSIS — R61 Generalized hyperhidrosis: Secondary | ICD-10-CM | POA: Insufficient documentation

## 2013-05-16 DIAGNOSIS — E785 Hyperlipidemia, unspecified: Secondary | ICD-10-CM | POA: Insufficient documentation

## 2013-05-16 DIAGNOSIS — R002 Palpitations: Secondary | ICD-10-CM | POA: Insufficient documentation

## 2013-05-16 DIAGNOSIS — R079 Chest pain, unspecified: Secondary | ICD-10-CM

## 2013-05-16 DIAGNOSIS — Z87891 Personal history of nicotine dependence: Secondary | ICD-10-CM | POA: Insufficient documentation

## 2013-05-16 DIAGNOSIS — R0609 Other forms of dyspnea: Secondary | ICD-10-CM | POA: Insufficient documentation

## 2013-05-16 DIAGNOSIS — R5381 Other malaise: Secondary | ICD-10-CM | POA: Insufficient documentation

## 2013-05-16 DIAGNOSIS — I10 Essential (primary) hypertension: Secondary | ICD-10-CM | POA: Insufficient documentation

## 2013-05-16 MED ORDER — REGADENOSON 0.4 MG/5ML IV SOLN
0.4000 mg | Freq: Once | INTRAVENOUS | Status: AC
  Administered 2013-05-16: 0.4 mg via INTRAVENOUS

## 2013-05-16 MED ORDER — TECHNETIUM TC 99M SESTAMIBI GENERIC - CARDIOLITE
30.0000 | Freq: Once | INTRAVENOUS | Status: AC | PRN
  Administered 2013-05-16: 30 via INTRAVENOUS

## 2013-05-16 MED ORDER — TECHNETIUM TC 99M SESTAMIBI GENERIC - CARDIOLITE
10.0000 | Freq: Once | INTRAVENOUS | Status: AC | PRN
  Administered 2013-05-16: 10 via INTRAVENOUS

## 2013-05-16 NOTE — Progress Notes (Signed)
[facility] SITE 3 NUCLEAR MED [address] [place], [place] [address] [phone]    Cardiology Nuclear Med Study  Laurie Horton is a 67 y.o. female     MRN : [id number]     DOB: October 16, 1946  Procedure Date: 05/16/2013  Nuclear Med Background Indication for Stress Test:  Evaluation for Ischemia and Abnormal EKG History:  '06 XBM:WUXLKG, EF=75%; 05/05/13 Hospital with CP, negative enzymes Cardiac Risk Factors: Carotid Disease, CVA, Family History - CAD, History of Smoking, Hypertension, Lipids and Obesity  Symptoms:  Chest Tightness with and without Exertion (last episode of chest discomfort was Saturday (05/13/13)), Diaphoresis, Dizziness, DOE/SOB, Fatigue, Palpitations, Rapid HR and Vomiting   Nuclear Pre-Procedure Caffeine/Decaff Intake:  None>12 hrs NPO After: 6:00pm   Lungs:  Clear. O2 Sat: 91% on 2 liters of O2 at baseline. O2 increased to 4 liters with Lexiscan. IV 0.9% NS with Angio Cath:  22g  IV Site: R Antecubital x 1, tolerated well IV Started by:  Irean Hong, RN  Chest Size (in):  44 Cup Size: B  Height: 5\' 4"  (1.626 m)  Weight:  198 lb (89.812 kg)  BMI:  Body mass index is 33.97 kg/(m^2). Tech Comments:  Held Toprol x 24 hrs. Nebulizer treatment @ 5:45 am today per patient;O2 Sat 88% RA on arrival with O2 started @ 2L via [place]. Irean Hong, RN    Nuclear Med Study 1 or 2 day study: 1 day  Stress Test Type:  Eugenie Birks  Reading MD: Charlton Haws, MD  Order Authorizing Provider:  Erionna Strum Sw[place], MD  Resting Radionuclide: Technetium 57m Sestamibi  Resting Radionuclide Dose: 11.0 mCi   Stress Radionuclide:  Technetium 19m Sestamibi  Stress Radionuclide Dose: 33.0 mCi           Stress Protocol Rest HR: 101 Stress HR: 109  Rest BP: 114/78 Stress BP: 105/81  Exercise Time (min): n/a METS: n/a   Predicted Max HR: 153 bpm % Max HR: 71.24 bpm Rate Pressure Product: 40102   Dose of Adenosine (mg):  n/a Dose of Lexiscan: 0.4 mg  Dose of Atropine (mg): n/a  Dose of Dobutamine: n/a mcg/kg/min (at max HR)  Stress Test Technologist: Smiley Houseman, CMA-N  Nuclear Technologist:  Domenic Polite, CNMT     Rest Procedure:  Myocardial perfusion imaging was performed at rest 45 minutes following the intravenous administration of Technetium 4m Sestamibi.  Rest ECG: NSR with non-specific ST-T wave changes  Stress Procedure:  The patient received IV Lexiscan 0.4 mg over 15-seconds.  Technetium 9m Sestamibi injected at 30-seconds.  She c/o dizziness with Lexiscan.  Quantitative spect images were obtained after a 45 minute delay.  Stress ECG: No significant change from baseline ECG  QPS Raw Data Images:  Normal; no motion artifact; normal heart/lung ratio. Stress Images:  Normal homogeneous uptake in all areas of the myocardium. Rest Images:  Normal homogeneous uptake in all areas of the myocardium. Subtraction (SDS):  Normal Transient Ischemic Dilatation (Normal <1.22):  n/a Lung/Heart Ratio (Normal <0.45):  0.44  Quantitative Gated Spect Images QGS EDV:  54 ml QGS ESV:  11 ml  Impression Exercise Capacity:  Lexiscan with no exercise. BP Response:  Normal blood pressure response. Clinical Symptoms:  Dizzy ECG Impression:  No significant ST segment change suggestive of ischemia. Comparison with Prior Nuclear Study: No images to compare  Overall Impression:  Normal stress nuclear study.  LV Ejection Fraction: 80%.  LV Wall Motion:  NL LV Function; NL Wall Motion  Charlton Haws

## 2013-07-10 ENCOUNTER — Encounter (INDEPENDENT_AMBULATORY_CARE_PROVIDER_SITE_OTHER): Payer: Self-pay

## 2013-07-19 DIAGNOSIS — M4120 Other idiopathic scoliosis, site unspecified: Secondary | ICD-10-CM | POA: Insufficient documentation

## 2013-08-14 ENCOUNTER — Ambulatory Visit (INDEPENDENT_AMBULATORY_CARE_PROVIDER_SITE_OTHER): Payer: Medicare Other | Admitting: Internal Medicine

## 2013-08-14 VITALS — BP 136/82 | HR 96 | Ht 63.0 in | Wt 205.0 lb

## 2013-08-14 DIAGNOSIS — Z23 Encounter for immunization: Secondary | ICD-10-CM

## 2013-08-14 DIAGNOSIS — R942 Abnormal results of pulmonary function studies: Secondary | ICD-10-CM

## 2013-08-14 DIAGNOSIS — J449 Chronic obstructive pulmonary disease, unspecified: Secondary | ICD-10-CM

## 2013-08-14 LAB — PULMONARY FUNCTION TEST
DL/VA % pred: 58 %
DL/VA: 2.73 ml/min/mmHg/L
DLCO unc % pred: 50 %
DLCO unc: 11.57 ml/min/mmHg
FEF 25-75 Pre: 0.73 L/sec
FEF2575-%Change-Post: 36 %
FEF2575-%Pred-Post: 51 %
FEV1-%Change-Post: 7 %
FEV1-%Pred-Post: 88 %
FEV1-%Pred-Pre: 82 %
FEV1-Post: 1.97 L
FEV1FVC-%Change-Post: 4 %
FEV1FVC-%Pred-Pre: 81 %
FEV6-Pre: 2.77 L
FEV6FVC-%Pred-Post: 99 %
FVC-%Change-Post: 3 %
Post FEV1/FVC ratio: 64 %
Post FEV6/FVC ratio: 96 %
Pre FEV6/FVC Ratio: 93 %
RV % pred: 77 %
TLC % pred: 88 %

## 2013-08-14 NOTE — Patient Instructions (Signed)
#  COPD - stable disease  - continue oxygen and nebs; ensure refills - Lung function: this is stable - Flu shot today  #Followup - 6  months with COPD cat score at followup  - Return sooner if there are problems  

## 2013-08-14 NOTE — Progress Notes (Signed)
Subjective:    Patient ID: Laurie Horton, female    DOB: 08/26/1946, 67 y.o.   MRN: [id number]  HPI Problem List 1. Smoker -  quit August 2011  reports that she quit smoking about 2 years ago. She does not have any smokeless tobacco history on file.   2. COPD   - with small PFO, exertional desat as of Sept 2011   - isolated low dlco on PFT Oct 2012 with emphysema on CT chest:  PFT 08/28/11 - shows isolated low dlco only 43%, Fev1 2.111/100%, ratio 60. - using oxygen 2013 following years of refusal  3. LLL pulmonary micronodule 0.6cm with subcentimeter mediastinal nodes  - dec 2010, aug 2011, oct 2012 - no further followup  4. Admissions for Resp failure/AECOPD   - Post p-lap for gall bladder - complicated by ICU resp failure and pneumonia LLL - March 2011, resolved on CT aug 2011 - RML PNA Sept 2012; resolved on CT oct 2012  5. Recurrent AECOPD  - aug/sept 2012 - Dr Cleta Alberts opd Rx - oct 2012 (office rx)  6. Poor health literacy, poly pharmacy, lives alone, pscyh issues  7. Code Status:  --DNAR, DNAI including no short term ventilation  - Oct 2012 pulm office visit. Thinks she will be dead by 22-Sep-2012- changed to full code - July 2014 but " do not want to be a vegetable"    OV 08/01/2012  COPD - mild yellow sputum, increase cough and increased wheeze past  2 weeks. No change in dyspnea or effort tolerance. CAT score 20. Vaccine - pneumovax 2007 prior to age 59. FLu - not had yet. Reluctantly agreed to have both. She is worried she had 'flu' afeter flu shot in 2012-2013 season but I explained advantages and limitations of flu shot and she agreed.   Smoking - still in remission  #COPD You have mild attack of copd called COPD exacerbation Please take biaxin per your request No need for prednisone burst at this point. However, if this gets worse you might need prednisone but right now you do not Have flu shot and pneumovax vaccine 08/01/2012 today Continue your medications and  oxygen  #Smoking  - glad you are still quit  #Followup  9 months or sooner if needed  OV 05/02/2013  Routine COPD followup. Feels baseline. Compliant with nebs and oxygen. Smoking in remission. COPD cat score down to 15  New problem: wants pre-op resp clearance for some kind of spinalprocedure. DEscribing L2-L5 radiculopathy and says Dr Retia Passe is going to "burn nerve roots" because she is refractory or only has temp re;lief with epidural shots. Wants permanent relief. PCP DAUB, STEVE A, MD has cuationed against surgerygiven prior experience with post op resp failure. I did speak to Dr  Glenard Haring. Saullo ahd he described 30 min RFA procedure on back with lidocaine +/- moderate sedation as tye typical procedure. She has had problems with moderate sedation before.  Preop risk assessment scores without use of sedation shows that her risk for pulmonary complications for this procedure is LOW    #COPD  - stable disease  - continue oxygen and nebs  - next visit do full PFT breathing test  #Preoperative pulmonray evaluation  - will talk to Dr Retia Passe; my nurse will reach his office now [phone]  - Will call you after I talk to him  #Followup  - 3 months with full PFT     OV 08/14/2013  Followup COPD. She says  she is stable. However, Score is 23.5 and appears worse than baseline. She says she is compliant with her ipratropium nebulizer and oxygen. He may function test today 08/14/2013 shows normal spirometry with FEV1 of 1.8 L/82%. FVC of 2.9 L 100%. Total lung capacity of 4.3 L/80%. She only has isolated reduction in diffusion capacity of 11.6/50%. She will have a flu shot today. She says last year she had a flu shot recommended by being and then she was in bed for 2 days but she denies any allergy to reaction and she is consented for a flu shot this time  CAT COPD Symptom & Quality of Life Score (GSK trademark) 0 is no burden. 5 is highest burden 08/01/2012 She is in aecopd 05/02/2013   08/14/2013   Never Cough -> Cough all the time 3 2 2   No phlegm in chest -> Chest is full of phlegm 3 0 3  No chest tightness -> Chest feels very tight 3 0 2  No dyspnea for 1 flight stairs/hill -> Very dyspneic for 1 flight of stairs 2 5 3   No limitations for ADL at home -> Very limited with ADL at home 1 3 2   Confident leaving home -> Not at all confident leaving home 1 0 5  Sleep soundly -> Do not sleep soundly because of lung condition 3 0 3.5  Lots of Energy -> No energy at all 4 5 3   TOTAL Score (max 40)  20 15 23.5    Past medical history reviewed: She finished a spinal procedure but apparently they did not give any moderate sedation or general anesthesia. Into a procedure was done under lidocaine local anesthesia and lasted over an hour. She says it was extremely painful and she is upset at me over this  Review of Systems  Constitutional: Negative for fever and unexpected weight change.  HENT: Negative for congestion, dental problem, ear pain, nosebleeds, postnasal drip, rhinorrhea, sinus pressure, sneezing, sore throat and trouble swallowing.   Eyes: Negative for redness and itching.  Respiratory: Negative for cough, chest tightness, shortness of breath and wheezing.   Cardiovascular: Negative for palpitations and leg swelling.  Gastrointestinal: Negative for nausea and vomiting.  Genitourinary: Negative for dysuria.  Musculoskeletal: Negative for joint swelling.  Skin: Negative for rash.  Neurological: Negative for headaches.  Hematological: Does not bruise/bleed easily.  Psychiatric/Behavioral: Negative for dysphoric mood. The patient is not nervous/anxious.        Objective:   Physical Exam     Vitals reviewed. Constitutional: She is oriented to person, place, and time. She appears well-developed and well-nourished. No distress.       obese  HENT:  Head: Normocephalic and atraumatic.  Right Ear: External ear normal.  Left Ear: External ear normal.  Mouth/Throat:  Normal Eyes: Conjunctivae and EOM are normal. Pupils are equal, round, and reactive to light. Right eye exhibits no discharge. Left eye exhibits no discharge. No scleral icterus.  Neck: Normal range of motion. Neck supple. No JVD present. No tracheal deviation present. No thyromegaly present.  Cardiovascular: Normal rate, regular rhythm, normal heart sounds and intact distal pulses.  Exam reveals no gallop and no friction rub.   No murmur heard. Pulmonary/Chest: Effort normal. No respiratory distress. She has no wheezes. She has no rales. She exhibits no tenderness.       Prolonged expiration No wheeze barrell chest  Abdominal: Soft. Bowel sounds are normal. She exhibits no distension and no mass. There is no tenderness. There is  no rebound and no guarding.  Musculoskeletal: Normal range of motion. She exhibits no edema and no tenderness.  Lymphadenopathy:    She has no cervical adenopathy.  Neurological: She is alert and oriented to person, place, and time. She has normal reflexes. No cranial nerve deficit. She exhibits normal muscle tone. Coordination normal.  Skin: Skin is warm and dry. No rash noted. She is not diaphoretic. No erythema. No pallor.  Psychiatric: She has a normal mood and affect. Her behavior is normal. Judgment and thought content normal.         Assessment & Plan:

## 2013-08-14 NOTE — Progress Notes (Signed)
PFT done today. 

## 2013-08-18 ENCOUNTER — Other Ambulatory Visit: Payer: Self-pay | Admitting: Physician Assistant

## 2013-08-18 NOTE — Telephone Encounter (Signed)
Dr Cleta Alberts, I couldn't find documentation that you have Rxd this for pt. I have pended it for your review.

## 2013-08-20 ENCOUNTER — Encounter: Payer: Self-pay | Admitting: Internal Medicine

## 2013-08-20 NOTE — Assessment & Plan Note (Signed)
#  COPD - stable disease  - continue oxygen and nebs; ensure refills - Lung function: this is stable - Flu shot today  #Followup - 6  months with COPD cat score at followup  - Return sooner if there are problems

## 2013-08-22 ENCOUNTER — Ambulatory Visit (INDEPENDENT_AMBULATORY_CARE_PROVIDER_SITE_OTHER): Payer: Medicare Other | Admitting: Emergency Medicine

## 2013-08-22 VITALS — BP 111/77 | HR 66 | Temp 97.8°F | Resp 16 | Wt 205.0 lb

## 2013-08-22 DIAGNOSIS — J4489 Other specified chronic obstructive pulmonary disease: Secondary | ICD-10-CM

## 2013-08-22 DIAGNOSIS — M797 Fibromyalgia: Secondary | ICD-10-CM

## 2013-08-22 DIAGNOSIS — J449 Chronic obstructive pulmonary disease, unspecified: Secondary | ICD-10-CM

## 2013-08-22 DIAGNOSIS — M545 Low back pain, unspecified: Secondary | ICD-10-CM

## 2013-08-22 DIAGNOSIS — I10 Essential (primary) hypertension: Secondary | ICD-10-CM

## 2013-08-22 DIAGNOSIS — IMO0001 Reserved for inherently not codable concepts without codable children: Secondary | ICD-10-CM

## 2013-08-22 NOTE — Progress Notes (Signed)
  Subjective:    Patient ID: Laurie Horton, female    DOB: 10/04/1946, 67 y.o.   MRN: [id number]  HPI patient had a followup multiple medical problems primarily depression hypertension COPD fibromyalgia and persistent back pain. She recently underwent I nerve ablation no acrolysis and had relief for approximately 2 months. She enters today to discuss her current medication regimen.    Review of Systems     Objective:   Physical Exam patient chest was clear today her heart was regular rate no murmurs there is tenderness in the SI joint on the right. Deep tendon reflexes are 2+ and symmetrical without motor weakness         Assessment & Plan:  I did not change any of her medications. I told her she was not a candidate to take any benzodiazepines because of her sensitivity to these drugs. She was given O2 2 L for 15 minutes prior to leaving. Her pulse ox was 89 at the time of departure. She was advised to followup with Dr. Corliss Skains  for for any medication management of her pain.

## 2013-08-23 ENCOUNTER — Telehealth: Payer: Self-pay

## 2013-08-23 NOTE — Telephone Encounter (Signed)
FOR DR DAUB                    FYI  PT WANTED YOU TO KNOW SHE SENT THE LETTER YOU HAD WRITTEN FOR HER TO [employer] AND THEY CALLED HER TODAY AND TOLD HER THE INSURANCE IS PAYING THE AMBULANCE BILL AND SHE IS SO HAPPY, TO TELL YOU THANKS YOU MAY CALL HER AT [phone] IF NEEDED

## 2013-09-02 ENCOUNTER — Ambulatory Visit (INDEPENDENT_AMBULATORY_CARE_PROVIDER_SITE_OTHER): Payer: Medicare Other | Admitting: Emergency Medicine

## 2013-09-02 VITALS — BP 124/82 | HR 56 | Temp 97.9°F | Resp 18 | Ht 62.75 in | Wt 202.8 lb

## 2013-09-02 DIAGNOSIS — J449 Chronic obstructive pulmonary disease, unspecified: Secondary | ICD-10-CM

## 2013-09-02 DIAGNOSIS — J189 Pneumonia, unspecified organism: Secondary | ICD-10-CM

## 2013-09-02 DIAGNOSIS — R109 Unspecified abdominal pain: Secondary | ICD-10-CM

## 2013-09-02 DIAGNOSIS — M549 Dorsalgia, unspecified: Secondary | ICD-10-CM

## 2013-09-02 MED ORDER — HYDROCODONE-ACETAMINOPHEN 5-325 MG PO TABS: 1.0000 | ORAL_TABLET | Freq: Four times a day (QID) | ORAL | Status: DC | PRN

## 2013-09-02 MED ORDER — ALBUTEROL SULFATE HFA 108 (90 BASE) MCG/ACT IN AERS: 2.0000 | INHALATION_SPRAY | RESPIRATORY_TRACT | Status: DC | PRN

## 2013-09-02 NOTE — Progress Notes (Signed)
This chart was scribed for [name], MD by Caryn Bee, Medical Scribe. This patient was seen in Room/bed 14 and the patient's care was started at 10:39 AM.  Subjective:    Patient ID: Laurie Horton, female    DOB: October 20, 1946, 67 y.o.   MRN: [id number]  HPI HPI Comments: Laurie Horton is a 67 y.o. female who presents to [facility] requesting prescription refill. Pt ran out of her medication last night and her PCP does not do prescription refills on the weekends. Pt also complains of a left hand injury that occurred over 2 months ago that has not completely healed. She states that her cat caused the injury. Pt had three back injection yesterday by Dr. Retia Passe. She takes 1 hydrocodone in the morning and one at bed time. Pt is asking for refill that will last until 09/05/2013. She reports having a stomach virus with associated diarrhea. Her diarrhea has since resolved. Pt has been eating jello-O and has been taking fluids.   Review of Systems  Gastrointestinal: Positive for diarrhea.  Musculoskeletal: Positive for arthralgias (left hand).   Past Surgical History  Procedure Laterality Date  . Total abdominal hysterectomy      post op needed oxygen was told "she gave them a scare"  . Knee arthroscopy  2000    left  . Laparoscopic cholecystectomy  04-16-2010    [facility]  . Tubal ligation    . US echocardiography  11/20/2009    EF 55-60%  . Cardiovascular stress test  12/26/2004    EF 74%. NO EVIDENCE OF ISCHEMIA   History   Social History  . Marital Status: Divorced    Spouse Name: N/A    Number of Children: 2  . Years of Education: N/A   Occupational History  . disability    Social History Main Topics  . Smoking status: Former Smoker    Quit date: 06/02/2010  . Smokeless tobacco: Not on file  . Alcohol Use: No  . Drug Use: No  . Sexual Activity: Not on file   Other Topics Concern  . Not on file   Social History Narrative  . No narrative on file   Past Medical History   Diagnosis Date  . Depression   . GERD (gastroesophageal reflux disease)   . HTN (hypertension)   . Hiatal hernia   . COPD (chronic obstructive pulmonary disease)   . Diverticulitis   . DVT (deep venous thrombosis)   . Obesity   . Hyperlipidemia   . PFO (patent foramen ovale)   . PAT (paroxysmal atrial tachycardia)   . Right middle lobe pneumonia 07/24/2011    First noted at admit 07/10/11. Persists on cxr 07/22/11. Cleared on CT 08/24/11. No further followup  . PULMONARY NODULE, LEFT LOWER LOBE 10/14/2009    5mm LLL nodule dec 2010. Stable and 4mm in Oct 2012. No further fu  . TOBACCO ABUSE 06/04/2009  . Osteoporosis   . Fibromyalgia   . Stroke    Family History  Problem Relation Age of Onset  . Dementia Mother   . Diabetes Mother   . Alzheimer's disease Mother   . Heart attack Brother 43  . Schizophrenia Sister   . Diabetes Sister   . Tremor Sister   . Heart attack Father    Allergies  Allergen Reactions  . Alprazolam     REACTION: stops breathing  . Budesonide-Formoterol Fumarate     bliusters  . Aciphex [Rabeprazole Sodium]  rash  . Avelox [Moxifloxacin Hcl In Nacl]   . Esomeprazole Magnesium     REACTION: "bouncing off walls"  . Eszopiclone   . Iodine     REACTION: swelling in throat  . Loratadine     claritin D causes shaking  . Lotrimin [Clotrimazole]   . Oxcarbazepine Other (See Comments)    Causes deep sleep and dizziness  . Pseudoephedrine Hcl Er   . Zolpidem Tartrate     REACTION: "slept for a week"  . Bextra [Valdecoxib] Rash  . Ceclor [Cefaclor] Rash  . Covera-Hs [Verapamil Hcl] Palpitations  . Dicyclomine Hcl Rash  . Tessalon Perles Rash        Objective:   Physical Exam there is an ecchymotic area over the back of the left hand but good range of motion of all the fingers. Chest exam reveals diminished breath sounds with increased AP diameter consistent with her COPD. Cardiac exam heart sounds are distant without murmur. Abdominal exam  reveals some mild upper abdominal discomfort without rebound        Assessment & Plan:  She has had chronic GI problems in the care of Dr. Randa Evens for this. We'll go ahead and treat with hydrocodone for pain she is given #10 tablets that she will followup with Dr. Corliss Skains on Tuesday.

## 2013-09-18 ENCOUNTER — Other Ambulatory Visit: Payer: Self-pay | Admitting: Emergency Medicine

## 2013-10-12 ENCOUNTER — Telehealth: Payer: Self-pay

## 2013-10-12 NOTE — Telephone Encounter (Signed)
She must come in for this. Called her to advise.

## 2013-10-12 NOTE — Telephone Encounter (Signed)
Patient has an abcessed tooth.   She is asking Dr. Cleta Alberts to write an antibiotic until she can see  Her dentist tomorrow. [facility]   [phone]

## 2013-10-16 ENCOUNTER — Telehealth: Payer: Self-pay | Admitting: Internal Medicine

## 2013-10-16 NOTE — Telephone Encounter (Signed)
I spoke with pt. She reports she needs to have oral surgery. Wants to know if she is safe to use "laughing gas" since she can't use anesthesia. Please advise MR thanks

## 2013-10-16 NOTE — Telephone Encounter (Signed)
Not sure. Please have her dentist call me on my cell; this week after 5pm or before 8am. NExt week anytime  Dr. [name], M.D., [facility].C.P Pulmonary and Critical Care Medicine Staff Physician [facility] [place] [facility] Pager: [phone], If no answer or between  15:00h - 7:00h: call [phone]  10/16/2013 10:09 PM

## 2013-10-17 NOTE — Telephone Encounter (Signed)
Pt is aware of MR response. She said that she will wait until it gets closer to her appointment and contact us then.

## 2013-10-22 ENCOUNTER — Other Ambulatory Visit: Payer: Self-pay | Admitting: Physician Assistant

## 2013-11-20 ENCOUNTER — Other Ambulatory Visit: Payer: Self-pay | Admitting: Emergency Medicine

## 2013-11-24 ENCOUNTER — Telehealth: Payer: Self-pay

## 2013-11-24 NOTE — Telephone Encounter (Signed)
Tooth pain severe.  Patient cannot take otc meds without Dr. Caren Griffins approval.  Can she take motrin    [phone]

## 2013-11-25 ENCOUNTER — Ambulatory Visit (INDEPENDENT_AMBULATORY_CARE_PROVIDER_SITE_OTHER): Payer: Medicare Other | Admitting: Family Medicine

## 2013-11-25 VITALS — BP 130/90 | HR 124 | Temp 98.7°F | Resp 20 | Ht 63.0 in | Wt 192.0 lb

## 2013-11-25 DIAGNOSIS — G894 Chronic pain syndrome: Secondary | ICD-10-CM

## 2013-11-25 DIAGNOSIS — M797 Fibromyalgia: Secondary | ICD-10-CM

## 2013-11-25 DIAGNOSIS — M790 Rheumatism, unspecified: Secondary | ICD-10-CM

## 2013-11-25 DIAGNOSIS — K044 Acute apical periodontitis of pulpal origin: Secondary | ICD-10-CM

## 2013-11-25 DIAGNOSIS — K047 Periapical abscess without sinus: Secondary | ICD-10-CM

## 2013-11-25 DIAGNOSIS — K089 Disorder of teeth and supporting structures, unspecified: Secondary | ICD-10-CM

## 2013-11-25 DIAGNOSIS — K0889 Other specified disorders of teeth and supporting structures: Secondary | ICD-10-CM

## 2013-11-25 MED ORDER — AMOXICILLIN 875 MG PO TABS: 875.0000 mg | ORAL_TABLET | Freq: Two times a day (BID) | ORAL | Status: DC

## 2013-11-25 MED ORDER — OXYCODONE-ACETAMINOPHEN 5-325 MG PO TABS: 1.0000 | ORAL_TABLET | Freq: Four times a day (QID) | ORAL | Status: DC | PRN

## 2013-11-25 NOTE — Progress Notes (Signed)
Subjective: Patient he sees Dr. Everlene Farrier. She has a history of fibromyalgia and is on chronic pain medication including twice a day hydrocodone and Lyrica and other meds. She's had a bad dental pain for several days. She called the dentist yesterday. She had a appointment for February 5, but they moved to until early next week. Her neighbor had a cold or prescription meds he just gave her ibuprofen. She still has her badly. She came in here for something for the pain.  Objective:  Face is not swollen. Patient is agonizing with the pain. Throat looks clear. Teeth do not appear terribly well apparently she has a crack underneath one of her feelings. She is tender in her gums and submandibular area. Small nodes.  Assessment:  Dental pain Probable dental infection Chronic pain syndrome Fibromyalgia  Plan: Resume her regular medicines except hold the hydrocodone Give Percocet 5/325 #12 to take one every 6 hours as needed for severe pain Amoxicillin 875 twice a day Keep her dental appointment

## 2013-11-25 NOTE — Patient Instructions (Signed)
Take pain pills every 6 hours only if needed for severe pain  Resume your regular medications except for the hydrocodone. Resume it when you're finished the oxycodone.  Take antibiotic twice daily. Stop immediately if any rash or other allergic reaction.  Keep your appointment with the dentist

## 2013-11-27 NOTE — Telephone Encounter (Signed)
Pt was seen for this 11/25/13

## 2013-11-27 NOTE — Telephone Encounter (Signed)
Can pt take motrin for her tooth pain?

## 2013-12-04 DIAGNOSIS — M4716 Other spondylosis with myelopathy, lumbar region: Secondary | ICD-10-CM | POA: Insufficient documentation

## 2013-12-05 ENCOUNTER — Ambulatory Visit (INDEPENDENT_AMBULATORY_CARE_PROVIDER_SITE_OTHER): Payer: Medicare Other | Admitting: Emergency Medicine

## 2013-12-05 ENCOUNTER — Encounter: Payer: Self-pay | Admitting: Emergency Medicine

## 2013-12-05 ENCOUNTER — Other Ambulatory Visit: Payer: Self-pay | Admitting: Radiology

## 2013-12-05 ENCOUNTER — Ambulatory Visit: Payer: Medicare Other

## 2013-12-05 VITALS — BP 132/72 | HR 75 | Temp 98.4°F | Resp 16 | Ht 63.0 in | Wt 195.8 lb

## 2013-12-05 DIAGNOSIS — J441 Chronic obstructive pulmonary disease with (acute) exacerbation: Secondary | ICD-10-CM

## 2013-12-05 DIAGNOSIS — K044 Acute apical periodontitis of pulpal origin: Secondary | ICD-10-CM

## 2013-12-05 DIAGNOSIS — K047 Periapical abscess without sinus: Secondary | ICD-10-CM

## 2013-12-05 DIAGNOSIS — R05 Cough: Secondary | ICD-10-CM

## 2013-12-05 DIAGNOSIS — R059 Cough, unspecified: Secondary | ICD-10-CM

## 2013-12-05 LAB — POCT CBC
GRANULOCYTE PERCENT: 72.9 % (ref 37–80)
HCT, POC: 44.5 % (ref 37.7–47.9)
HEMOGLOBIN: 14 g/dL (ref 12.2–16.2)
Lymph, poc: 0.7 (ref 0.6–3.4)
MCH, POC: 31.5 pg — AB (ref 27–31.2)
MCHC: 31.5 g/dL — AB (ref 31.8–35.4)
MCV: 100.1 fL — AB (ref 80–97)
MID (CBC): 0.4 (ref 0–0.9)
MPV: 11.5 fL (ref 0–99.8)
PLATELET COUNT, POC: 173 10*3/uL (ref 142–424)
POC Granulocyte: 3 (ref 2–6.9)
POC LYMPH PERCENT: 17.6 %L (ref 10–50)
POC MID %: 9.5 %M (ref 0–12)
RBC: 4.45 M/uL (ref 4.04–5.48)
RDW, POC: 14.2 %
WBC: 4.1 10*3/uL — AB (ref 4.6–10.2)

## 2013-12-05 MED ORDER — BETAMETHASONE DIPROPIONATE AUG 0.05 % EX CREA: TOPICAL_CREAM | Freq: Two times a day (BID) | CUTANEOUS | Status: DC

## 2013-12-05 NOTE — Progress Notes (Addendum)
Subjective:    Patient ID: Laurie Horton, female    DOB: July 05, 1946, 68 y.o.   MRN: [id number] This chart was scribed for [name], MD by Rolanda Lundborg, ED Scribe. This patient was seen in room 22 and the patient's care was started at 8:23 AM.  Chief Complaint  Patient presents with  . COPD  . Hypertension  . Fibromyalgia   HPI HPI Comments: Laurie Horton is a 67 y.o. female with a h/o COPD, HTN, fibromyalgia who presents to the [facility] for a follow up. She is on oxygen at home but only uses it after exerting energy. She states she usually takes her oxygen with her when she leaves home but not today. She states she has pneumonia and is on Augmentin prescribed by Dr Annie Sable. She reports productive cough with yellow sputum.   She is seeing Dr Owens Shark, oral surgeon, for an infected tooth. She is scheduled for an extraction on Monday.     Past Medical History  Diagnosis Date  . Depression   . GERD (gastroesophageal reflux disease)   . HTN (hypertension)   . Hiatal hernia   . COPD (chronic obstructive pulmonary disease)   . Diverticulitis   . DVT (deep venous thrombosis)   . Obesity   . Hyperlipidemia   . PFO (patent foramen ovale)   . PAT (paroxysmal atrial tachycardia)   . Right middle lobe pneumonia 07/24/2011    First noted at admit 07/10/11. Persists on cxr 07/22/11. Cleared on CT 08/24/11. No further followup  . PULMONARY NODULE, LEFT LOWER LOBE 10/14/2009    47mm LLL nodule dec 2010. Stable and 51mm in Oct 2012. No further fu  . TOBACCO ABUSE 06/04/2009  . Osteoporosis   . Fibromyalgia   . Stroke    Current Outpatient Prescriptions on File Prior to Visit  Medication Sig Dispense Refill  . albuterol (PROVENTIL) (2.5 MG/3ML) 0.083% nebulizer solution Take 2.5 mg by nebulization every 6 (six) hours as needed.      Marland Kitchen albuterol (VENTOLIN HFA) 108 (90 BASE) MCG/ACT inhaler Inhale 2 puffs into the lungs every 4 (four) hours as needed for wheezing.   8.5 Inhaler  11  . amLODipine (NORVASC) 5 MG tablet Take 5 mg by mouth daily.      Marland Kitchen amoxicillin (AMOXIL) 875 MG tablet Take 1 tablet (875 mg total) by mouth 2 (two) times daily.  20 tablet  0  . aspirin 325 MG tablet Take 325 mg by mouth daily.        Marland Kitchen desvenlafaxine (PRISTIQ) 50 MG 24 hr tablet Take 50 mg by mouth daily.        . fish oil-omega-3 fatty acids 1000 MG capsule Take 1 g by mouth daily.       Marland Kitchen glucosamine-chondroitin 500-400 MG tablet Take 1 tablet by mouth daily.       Marland Kitchen lidocaine (LIDODERM) 5 % Place 3 patches onto the skin as needed. Remove & Discard patch within 12 hours or as directed by MD      . montelukast (SINGULAIR) 10 MG tablet TAKE 1 TABLET AT BEDTIME  30 tablet  2  . NORVASC 5 MG tablet TAKE 1 TABLET BY MOUTH EVERY DAY  30 tablet  3  . oxybutynin (DITROPAN) 5 MG tablet Take 5 mg by mouth daily.      Marland Kitchen oxyCODONE-acetaminophen (ROXICET) 5-325 MG per tablet Take 1 tablet by mouth every 6 (six) hours as needed for severe pain.  15 tablet  0  . pantoprazole (PROTONIX) 40 MG tablet Take 1 tablet by mouth daily.      . pregabalin (LYRICA) 75 MG capsule Take 75 mg by mouth daily.        . TOPROL XL 25 MG 24 hr tablet TAKE 1/2 TABLET BY MOUTH EVERY DAY  15 tablet  4  . traZODone (DESYREL) 100 MG tablet Take 150 mg by mouth at bedtime.       . cyclobenzaprine (FLEXERIL) 10 MG tablet Take 10 mg by mouth 2 (two) times daily.       . diazepam (VALIUM) 5 MG tablet Take 5 mg by mouth as needed for anxiety.       Marland Kitchen HYDROcodone-acetaminophen (NORCO) 5-325 MG per tablet Take 1 tablet by mouth every 6 (six) hours as needed for pain.  10 tablet  0  . HYDROcodone-acetaminophen (VICODIN) 5-500 MG per tablet Take 2 tablets by mouth 2 (two) times daily.       Marland Kitchen ipratropium (ATROVENT) 0.02 % nebulizer solution Take 500 mcg by nebulization as needed (thightness).        No current facility-administered medications on file prior to visit.   Allergies  Allergen Reactions  . Alprazolam      REACTION: stops breathing  . Budesonide-Formoterol Fumarate     bliusters  . Aciphex [Rabeprazole Sodium]     rash  . Avelox [Moxifloxacin Hcl In Nacl]   . Esomeprazole Magnesium     REACTION: "bouncing off walls"  . Eszopiclone   . Iodine     REACTION: swelling in throat  . Loratadine     claritin D causes shaking  . Lotrimin [Clotrimazole]   . Oxcarbazepine Other (See Comments)    Causes deep sleep and dizziness  . Pseudoephedrine Hcl Er   . Zolpidem Tartrate     REACTION: "slept for a week"  . Bextra [Valdecoxib] Rash  . Ceclor [Cefaclor] Rash  . Covera-Hs [Verapamil Hcl] Palpitations  . Dicyclomine Hcl Rash  . Tessalon Perles Rash      Review of Systems  Constitutional: Negative for fatigue and unexpected weight change.  HENT: Positive for dental problem.   Respiratory: Positive for cough. Negative for chest tightness and shortness of breath.   Cardiovascular: Negative for chest pain, palpitations and leg swelling.  Gastrointestinal: Negative for abdominal pain and blood in stool.  Neurological: Negative for dizziness, syncope, light-headedness and headaches.       Objective:   Physical Exam CONSTITUTIONAL: Well developed/well nourished HEAD: Normocephalic/atraumatic EYES: EOMI/PERRL ENMT: Mucous membranes moist. Right lower first molar has a deep cavity. NECK: supple no meningeal signs SPINE:entire spine nontender CV: S1/S2 noted, no murmurs/rubs/gallops noted LUNGS: Dry rales in both bases with good air exchange. ABDOMEN: soft, nontender, no rebound or guarding GU:no cva tenderness NEURO: Pt is awake/alert, moves all extremitiesx4 EXTREMITIES: pulses normal, full ROM SKIN: warm, color normal PSYCH: no abnormalities of mood noted   Filed Vitals:   12/05/13 0817  BP: 132/72  Pulse: 75  Temp: 98.4 F (36.9 C)  TempSrc: Oral  Resp: 16  Height: 5\' 3"  (1.6 m)  Weight: 195 lb 12.8 oz (88.814 kg)  SpO2: 86%   [facility] reading (PRIMARY) by  Dr. Everlene Farrier COPD  with scarring in both bases and borderline cardiomegaly no pneumonia seen.       Assessment & Plan:   1. COPD exacerbation   2. Cough   3. Infected tooth    Patient appears stable at present. I did  not see any signs of definite pneumonia. I do think she would qualify to have her surgery. They need to be extremely cautious regarding her anesthesia. Patient is well aware of this and has conferred this to the oral surgeon who will be doing her extraction. Her white count is 4100.  I personally performed the services described in this documentation, which was scribed in my presence. The recorded information has been reviewed and is accurate.

## 2013-12-05 NOTE — Telephone Encounter (Signed)
[facility]

## 2013-12-07 ENCOUNTER — Telehealth: Payer: Self-pay

## 2013-12-07 NOTE — Telephone Encounter (Signed)
Pt went to pick up meds at pharmacy and there was a cream that they have not discussed and she would like to know why it was prescribed  Best number [phone]

## 2013-12-08 NOTE — Telephone Encounter (Signed)
I do not see where a cream was written by our office. Pt notified.

## 2014-01-02 ENCOUNTER — Encounter (INDEPENDENT_AMBULATORY_CARE_PROVIDER_SITE_OTHER): Payer: Self-pay

## 2014-02-13 ENCOUNTER — Other Ambulatory Visit: Payer: Self-pay | Admitting: Rehabilitation

## 2014-02-13 DIAGNOSIS — M545 Low back pain, unspecified: Secondary | ICD-10-CM

## 2014-02-17 ENCOUNTER — Ambulatory Visit
Admission: RE | Admit: 2014-02-17 | Discharge: 2014-02-17 | Disposition: A | Discharge status: Home / Self Care | Payer: Medicare Other | Source: Ambulatory Visit | Attending: Rehabilitation | Admitting: Rehabilitation

## 2014-02-17 DIAGNOSIS — M545 Low back pain, unspecified: Secondary | ICD-10-CM

## 2014-02-19 ENCOUNTER — Other Ambulatory Visit: Payer: Self-pay | Admitting: Emergency Medicine

## 2014-02-20 ENCOUNTER — Other Ambulatory Visit: Payer: Self-pay | Admitting: Emergency Medicine

## 2014-03-08 ENCOUNTER — Other Ambulatory Visit: Payer: Self-pay | Admitting: Physician Assistant

## 2014-03-08 ENCOUNTER — Telehealth: Payer: Self-pay

## 2014-03-08 ENCOUNTER — Telehealth: Payer: Self-pay | Admitting: Physician Assistant

## 2014-03-08 ENCOUNTER — Ambulatory Visit (INDEPENDENT_AMBULATORY_CARE_PROVIDER_SITE_OTHER): Payer: Medicare Other | Admitting: Physician Assistant

## 2014-03-08 ENCOUNTER — Ambulatory Visit: Payer: Medicare Other

## 2014-03-08 VITALS — BP 112/70 | HR 70 | Temp 98.3°F | Resp 18 | Wt 193.0 lb

## 2014-03-08 DIAGNOSIS — N644 Mastodynia: Secondary | ICD-10-CM

## 2014-03-08 DIAGNOSIS — J449 Chronic obstructive pulmonary disease, unspecified: Secondary | ICD-10-CM

## 2014-03-08 LAB — POCT CBC
Granulocyte percent: 61.8 %G (ref 37–80)
HEMATOCRIT: 33.8 % — AB (ref 37.7–47.9)
Hemoglobin: 10.8 g/dL — AB (ref 12.2–16.2)
Lymph, poc: 1.5 (ref 0.6–3.4)
MCH, POC: 31.4 pg — AB (ref 27–31.2)
MCHC: 32 g/dL (ref 31.8–35.4)
MCV: 98.4 fL — AB (ref 80–97)
MID (cbc): 0.4 (ref 0–0.9)
MPV: 9.9 fL (ref 0–99.8)
POC Granulocyte: 3 (ref 2–6.9)
POC LYMPH PERCENT: 30.4 %L (ref 10–50)
POC MID %: 7.8 %M (ref 0–12)
Platelet Count, POC: 174 10*3/uL (ref 142–424)
RBC: 3.44 M/uL — AB (ref 4.04–5.48)
RDW, POC: 13.4 %
WBC: 4.8 10*3/uL (ref 4.6–10.2)

## 2014-03-08 MED ORDER — DOXYCYCLINE HYCLATE 100 MG PO TABS: 100.0000 mg | ORAL_TABLET | Freq: Two times a day (BID) | ORAL | Status: DC

## 2014-03-08 MED ORDER — ALBUTEROL SULFATE HFA 108 (90 BASE) MCG/ACT IN AERS: 2.0000 | INHALATION_SPRAY | RESPIRATORY_TRACT | Status: DC | PRN

## 2014-03-08 NOTE — Patient Instructions (Signed)
Continue your Hydrocodone/tylenol 2x/day -- continue your ASA -  Add Tylenol 500mg  1 pill 3x/day to help with inflammation and pain Take your antibiotic. Continue ice and support of your breast.

## 2014-03-08 NOTE — Telephone Encounter (Signed)
I spoke with Dr Marcelo Baldy at [facility] about the patient who had her diagnostic mammogram and left breast US.  During the exam during breast palpation patient was very tender and stated that she had an appt with Dr Everlene Farrier in several weeks.  He was concerned because she had not been evaluated by anyone but him and he thought we should know about her exam and nothing that could be explained with the MRI.  We are going to be looking for the result and then contact the patient regarding possibility an earlier visit for evaluation.

## 2014-03-08 NOTE — Telephone Encounter (Signed)
Patient states that the pharmacy is needing a prior authorization for her medication.   Best#: [phone]

## 2014-03-08 NOTE — Telephone Encounter (Signed)
Presents to clinic for evaluation.

## 2014-03-08 NOTE — Progress Notes (Signed)
Subjective:    Patient ID: Laurie Horton, female    DOB: [birth year], 68 y.o.   MRN: [id number]  HPI Left breast pain for a 2 weeks that is not changing without any injury to the breast that she knows of.  The pain is in the medial upper quadrant.  No new bra, no trauma to the breast known.  She is having no erythema of the skin, no discharge from the nipple.  The breast hurts to roll over on it at night and hurts to walk - she has been using a lumbar spine support belt to help lift her breast and that helps  reduce the pain slightly when she is walking.  She has taken no medications because of her extensive medication list and she has been instructed to not take OTC medications.  She takes hydrocodone bid and she feels as though that does not help her pain - 'it is for muscle pain and this is tissue pain'.  She has been using cool compresses but does not think that it helps with the pain.  Pain does not get worse with deep breathing, coughing or movement.  Biopsy last year on the breast last year - slip placed - patient's neighbor has her worried that the clip has moved in her breast and she should have it removed because it is causing problems.  Diagnostic mammogramUltrasound - done today.  Review of Systems  Constitutional: Negative for fever and chills.  Respiratory: Positive for shortness of breath (no more than normal).   Cardiovascular: Negative for chest pain.       Objective:   Physical Exam  Vitals reviewed. Constitutional: She is oriented to person, place, and time. She appears well-developed and well-nourished.  HENT:  Head: Normocephalic and atraumatic.  Right Ear: External ear normal.  Left Ear: External ear normal.  Cardiovascular: Normal rate, regular rhythm and normal heart sounds.   No murmur heard. Pulmonary/Chest: Effort normal and breath sounds normal. She has no wheezes.  Abdominal: Soft.  Genitourinary: There is breast tenderness. No breast swelling, discharge  or bleeding. Pelvic exam was performed with patient supine.  Right breast tissue - granular tissue - no TTP Pt is tender over all her left breast tissue - the worse TTP is on the lower aspect of the breast.  Granular breast tissue.  Pt cries during the exam and pulls away when the left breast is palpated.  The breast tissue is lifted and the tenderness is in the breast tissue and not the chest wall.  Neurological: She is alert and oriented to person, place, and time.  Skin: Skin is warm and dry.  Psychiatric: She has a normal mood and affect. Her behavior is normal. Judgment and thought content normal.   [facility] reading (PRIMARY) by  Dr. Tamala Julian.  Neg.  Reviewed mammogram results from today with patient.  Mammogram oval indeterminate mass.  [place] reviewed with patient which showed multiples cyst cm left medial upper quadrant.  5/8 spoke with Dr Marcelo Baldy regarding clips placed 12/28/2012 - the clip is in place and no surrounding inflammation is seen on the mammogram.     Assessment & Plan:  COPD (chronic obstructive pulmonary disease) - rescue inhaler needed for when she is out of the house - Plan: albuterol (VENTOLIN HFA) 108 (90 BASE) MCG/ACT inhaler  Anemia - she will have a repeat CBC at her appt with Dr Everlene Farrier.  She is postmenopausal so if this is not improved in 3 weeks GI would  be the most likely.  She will monitor her stool for blood and color changes.  She has a GI MD - she had a colonoscopy in the past 10 years.  Pain of left breast - Plan: POCT CBC, DG Chest 2 View, doxycycline (VIBRA-TABS) 100 MG tablet  I have spoken with Dr Marcelo Baldy and Dr Tamala Julian about this patient.  Today on exam her pain seems out of proportion to the exam findings.  She has fibromyalgia, depression and self reported low pain tolerance and this may be resulting in some of her pain perception.  We are going to treat her for an infection with Doxy and add safe doses of Tylenol to her current Norco dosage.  She will continue  cool compresses and f/u in 3 days if no better - sooner if there is a worsening change in her pain.  Windell Hummingbird PA-C  [facility] 03/09/2014 8:28 PM

## 2014-03-09 ENCOUNTER — Other Ambulatory Visit: Payer: Self-pay

## 2014-03-09 DIAGNOSIS — J449 Chronic obstructive pulmonary disease, unspecified: Secondary | ICD-10-CM

## 2014-03-09 NOTE — Telephone Encounter (Signed)
Received request for refill.  Please disregard prior auth request for this RX.  Insurance covers ProAir and patient is ok with this.

## 2014-03-09 NOTE — Telephone Encounter (Signed)
PA was needed for inhaler. Called pharm to get them to run Rx as Pro-Air or Proventil instead of Ventolin and see which is covered. I was advised that the pharmacist did that last night and pt has p/up. She has also gotten the doxy. Nothing needed.

## 2014-03-20 ENCOUNTER — Ambulatory Visit: Payer: Medicare Other | Admitting: Emergency Medicine

## 2014-03-29 ENCOUNTER — Ambulatory Visit (INDEPENDENT_AMBULATORY_CARE_PROVIDER_SITE_OTHER): Payer: Medicare Other | Admitting: Emergency Medicine

## 2014-03-29 ENCOUNTER — Encounter: Payer: Self-pay | Admitting: Emergency Medicine

## 2014-03-29 VITALS — BP 144/74 | HR 65 | Temp 98.9°F | Resp 16 | Ht 64.5 in | Wt 195.0 lb

## 2014-03-29 DIAGNOSIS — R071 Chest pain on breathing: Secondary | ICD-10-CM

## 2014-03-29 DIAGNOSIS — M797 Fibromyalgia: Secondary | ICD-10-CM

## 2014-03-29 DIAGNOSIS — IMO0001 Reserved for inherently not codable concepts without codable children: Secondary | ICD-10-CM

## 2014-03-29 DIAGNOSIS — R7309 Other abnormal glucose: Secondary | ICD-10-CM

## 2014-03-29 DIAGNOSIS — R739 Hyperglycemia, unspecified: Secondary | ICD-10-CM

## 2014-03-29 DIAGNOSIS — R0789 Other chest pain: Secondary | ICD-10-CM

## 2014-03-29 DIAGNOSIS — D649 Anemia, unspecified: Secondary | ICD-10-CM

## 2014-03-29 DIAGNOSIS — N644 Mastodynia: Secondary | ICD-10-CM

## 2014-03-29 LAB — CBC WITH DIFFERENTIAL/PLATELET
Basophils Absolute: 0 10*3/uL (ref 0.0–0.1)
Basophils Relative: 0 % (ref 0–1)
EOS PCT: 1 % (ref 0–5)
Eosinophils Absolute: 0.1 10*3/uL (ref 0.0–0.7)
HEMATOCRIT: 40 % (ref 36.0–46.0)
Hemoglobin: 13.9 g/dL (ref 12.0–15.0)
LYMPHS PCT: 26 % (ref 12–46)
Lymphs Abs: 1.6 10*3/uL (ref 0.7–4.0)
MCH: 32.4 pg (ref 26.0–34.0)
MCHC: 34.8 g/dL (ref 30.0–36.0)
MCV: 93.2 fL (ref 78.0–100.0)
MONO ABS: 0.6 10*3/uL (ref 0.1–1.0)
Monocytes Relative: 10 % (ref 3–12)
Neutro Abs: 3.8 10*3/uL (ref 1.7–7.7)
Neutrophils Relative %: 63 % (ref 43–77)
Platelets: 204 10*3/uL (ref 150–400)
RBC: 4.29 MIL/uL (ref 3.87–5.11)
RDW: 14.2 % (ref 11.5–15.5)
WBC: 6.1 10*3/uL (ref 4.0–10.5)

## 2014-03-29 LAB — GLUCOSE, POCT (MANUAL RESULT ENTRY): POC Glucose: 95 mg/dl (ref 70–99)

## 2014-03-29 NOTE — Progress Notes (Signed)
Subjective:    Patient ID: Laurie Horton, female    DOB: 05-12-1946, 68 y.o.   MRN: [id number] This chart was scribed for Laurie Queen, MD by Terressa Koyanagi, ED Scribe. This patient was seen in room 21 and the patient's care was started at 8:31 AM.     HPI HPI Comments: [name] is a 68 y.o. female, with a history of HTN, COPD, GERD, depression, and fibromyalgia, who presents to the [facility] complaining of intermittent left breast pain onset approximately 5 weeks ago. Pt reports she was seen at the clinic on 03/08/2014 for the same. Since her last clinic visit, pt reports that she had a mammogram done and 3 cysts were discovered in her left breast. Pt reports, as directed in her last clinic visit, she has been taking tylenol and doxy without relief.    Review of Systems  Constitutional: Negative for fatigue and unexpected weight change.  Respiratory: Negative for chest tightness and shortness of breath.   Cardiovascular: Negative for chest pain, palpitations and leg swelling.  Gastrointestinal: Negative for abdominal pain and blood in stool.  Musculoskeletal:       Left breast pain  Neurological: Negative for dizziness, syncope, light-headedness and headaches.    Objective:    Physical Exam CONSTITUTIONAL: Well developed/well nourished HEAD: Normocephalic/atraumatic EYES: EOMI/PERRL ENMT: Mucous membranes moist NECK: supple no meningeal signs SPINE:entire spine nontender CV: S1/S2 noted, no murmurs/rubs/gallops noted LUNGS: Lungs are clear to auscultation bilaterally, no apparent distress ABDOMEN: soft, nontender, no rebound or guarding GU:no cva tenderness MUSC: medial left breast tenderness no masses  NEURO: Pt is awake/alert, moves all extremitiesx4 EXTREMITIES: pulses normal, full ROM SKIN: warm, color normal PSYCH: no abnormalities of mood noted  Left Breast Ultrasound for Painful Areas, Completed 03/08/14 at [facility]  is made to exam dated: 12/28/12, 06/27/13 Color flow and real-time ultrasound of the left breast were performed on the areas of interest. Gray scale images of the real-time examination were reviewed. Limited ultrasound performed, selected quadrants and/or other areas of concern were imaged and evaluated.   There is a benign 68mm oval simple cyst with a smooth internal wall in the left breast at 12 o'clock anterior depth. This oval simple cyst displays posterior acoustic enhancement. Color flow imaging demonstrates that there is no vascularity present. Elastography imaging assessment is soft. There also is a benign 6 mm oval cyst with a smooth internal wall in the left breast at 10 o'clock middle depth. This oval cyst displays posterior acoustic enhancement. Color flow imaging demonstrates that there is no vascularity present. Elastography imaging assessment is soft. Additionally, there is a benign 4 mm oval cyst in the left breast at 9 o'clock posterior depth. This oval cyst displays posterior acoustic enhancement.   Impression: Benign There is no sonographic evidence of malignancy  The 18mm oval simple cyst in the left breast at 12 o'clock anterior depth is benign The 50mm oval cyst in the left breast at 10 o'clock middle depth is benign The 63mm oval cyst in the left breast at 9 o'clock posterior depth is benign  Assessment & Plan:  Patient has what appears to be chest wall tenderness. She has had a thorough evaluation of her breast and did not show any signs of malignant change in the breast. She has 3 simple cysts noted. I have referred her to Dr. Brantley Stage to get his opinion regarding her situation.   8:35 AM-Discussed treatment plan which includes f/u  with her surgeon, Dr. Brantley Stage, with pt at bedside and pt agreed to plan.   I personally performed the services described in this documentation, which was scribed in my presence. The recorded information has been reviewed and is accurate.

## 2014-04-06 ENCOUNTER — Encounter (INDEPENDENT_AMBULATORY_CARE_PROVIDER_SITE_OTHER): Payer: Self-pay | Admitting: Surgery

## 2014-04-06 ENCOUNTER — Ambulatory Visit (INDEPENDENT_AMBULATORY_CARE_PROVIDER_SITE_OTHER): Payer: PRIVATE HEALTH INSURANCE | Admitting: Surgery

## 2014-04-06 VITALS — BP 136/78 | HR 76 | Temp 98.0°F | Resp 18 | Ht 64.0 in | Wt 195.0 lb

## 2014-04-06 DIAGNOSIS — R071 Chest pain on breathing: Secondary | ICD-10-CM

## 2014-04-06 DIAGNOSIS — R0789 Other chest pain: Secondary | ICD-10-CM | POA: Insufficient documentation

## 2014-04-06 NOTE — Progress Notes (Signed)
Patient ID: Laurie Horton, female   DOB: [birth year], 68 y.o.   MRN: [id number]  Chief Complaint  Patient presents with  . Establish Care    left breast     HPI Laurie Horton is a 68 y.o. female.   HPIPatient presents at the request of Dr. Cleta Alberts due to bilateral tender breast. She had a mammogram and ultrasound done in February 2013 which was read as BI-RADS 3. In the left breast there were 2 subcentimeter benign lesions identified. Six-month followup was recommended. She is at sore breasts since that time. Both breasts hurt  her. There is no discrete mass. There is no redness or drainage. 2 family history of breast or ovarian cancer. Iced makes it better. Recent imaging reveal breast cyst but nothing suspicious.  Seen in 2013 for similar symptoms.  Pain now more in chest wall. Pt states muscle hurts.   Past Medical History  Diagnosis Date  . Depression   . GERD (gastroesophageal reflux disease)   . HTN (hypertension)   . Hiatal hernia   . COPD (chronic obstructive pulmonary disease)   . Diverticulitis   . DVT (deep venous thrombosis)   . Obesity   . Hyperlipidemia   . PFO (patent foramen ovale)   . PAT (paroxysmal atrial tachycardia)   . Right middle lobe pneumonia 07/24/2011    First noted at admit 07/10/11. Persists on cxr 07/22/11. Cleared on CT 08/24/11. No further followup  . PULMONARY NODULE, LEFT LOWER LOBE 10/14/2009    5mm LLL nodule dec 2010. Stable and 4mm in Oct 2012. No further fu  . TOBACCO ABUSE 06/04/2009  . Osteoporosis   . Fibromyalgia   . Stroke     Past Surgical History  Procedure Laterality Date  . Total abdominal hysterectomy      post op needed oxygen was told "she gave them a scare"  . Knee arthroscopy  2000    left  . Laparoscopic cholecystectomy  04-16-2010    [facility]  . Tubal ligation    . US echocardiography  11/20/2009    EF 55-60%  . Cardiovascular stress test  12/26/2004    EF 74%. NO EVIDENCE OF ISCHEMIA    Family History  Problem Relation  Age of Onset  . Dementia Mother   . Diabetes Mother   . Alzheimer's disease Mother   . Heart attack Brother 4  . Schizophrenia Sister   . Diabetes Sister   . Tremor Sister   . Heart attack Father     Social History History  Substance Use Topics  . Smoking status: Former Smoker    Quit date: 06/02/2010  . Smokeless tobacco: Not on file  . Alcohol Use: No    Allergies  Allergen Reactions  . Alprazolam     REACTION: stops breathing  . Budesonide-Formoterol Fumarate     bliusters  . Aciphex [Rabeprazole Sodium]     rash  . Ambien [Zolpidem]   . Avelox [Moxifloxacin Hcl In Nacl]   . Esomeprazole Magnesium     REACTION: "bouncing off walls"  . Eszopiclone   . Iodine     REACTION: swelling in throat  . Loratadine     claritin D causes shaking  . Lotrimin [Clotrimazole]   . Lunesta [Eszopiclone]     REACTION: "slept for a week"  . Oxcarbazepine Other (See Comments)    Causes deep sleep and dizziness  . Pseudoephedrine Hcl Er   . Zolpidem Tartrate     REACTION: "slept for  a week"  . Bextra [Valdecoxib] Rash  . Ceclor [Cefaclor] Rash  . Covera-Hs [Verapamil Hcl] Palpitations  . Dicyclomine Hcl Rash  . Tessalon Perles Rash    Current Outpatient Prescriptions  Medication Sig Dispense Refill  . albuterol (PROVENTIL) (2.5 MG/3ML) 0.083% nebulizer solution Take 2.5 mg by nebulization every 6 (six) hours as needed.      Marland Kitchen albuterol (VENTOLIN HFA) 108 (90 BASE) MCG/ACT inhaler Inhale 2 puffs into the lungs every 4 (four) hours as needed for wheezing.  8.5 Inhaler  0  . aspirin 325 MG tablet Take 325 mg by mouth daily.        . budesonide (PULMICORT) 1 MG/2ML nebulizer solution Take 1 mg by nebulization daily.      Marland Kitchen desvenlafaxine (PRISTIQ) 50 MG 24 hr tablet Take 50 mg by mouth daily.        . diazepam (VALIUM) 5 MG tablet Take 5 mg by mouth as needed for anxiety.       . fish oil-omega-3 fatty acids 1000 MG capsule Take 1 g by mouth daily.       Marland Kitchen  glucosamine-chondroitin 500-400 MG tablet Take 1 tablet by mouth daily.       Marland Kitchen HYDROcodone-acetaminophen (NORCO/VICODIN) 5-325 MG per tablet Take 1 tablet by mouth 2 (two) times daily.      Marland Kitchen lidocaine (LIDODERM) 5 % Place 3 patches onto the skin as needed. Remove & Discard patch within 12 hours or as directed by MD      . montelukast (SINGULAIR) 10 MG tablet TAKE 1 TABLET AT BEDTIME  30 tablet  1  . NORVASC 5 MG tablet TAKE 1 TABLET BY MOUTH EVERY DAY  30 tablet  3  . oxybutynin (DITROPAN) 5 MG tablet Take 5 mg by mouth daily.      . pantoprazole (PROTONIX) 40 MG tablet Take 1 tablet by mouth daily.      . polyethylene glycol (MIRALAX / GLYCOLAX) packet Take 17 g by mouth daily as needed.      . pregabalin (LYRICA) 75 MG capsule Take 75 mg by mouth daily.        Marland Kitchen PRESCRIPTION MEDICATION Chlorhexidine 0.12% Swish with 1/2 oz by mouth for 30 seconds then spit, bid      . tiZANidine (ZANAFLEX) 4 MG tablet Take 2-4 mg by mouth every 6 (six) hours as needed for muscle spasms.      . TOPROL XL 25 MG 24 hr tablet TAKE 1/2 TABLET BY MOUTH EVERY DAY  15 tablet  4  . traZODone (DESYREL) 100 MG tablet Take 300 mg by mouth at bedtime.       Marland Kitchen ipratropium (ATROVENT) 0.02 % nebulizer solution Take 500 mcg by nebulization as needed (thightness).        No current facility-administered medications for this visit.    Review of Systems Review of Systems  Constitutional: Negative.   HENT: Negative.   Eyes: Negative.   Respiratory: Negative.   Cardiovascular: Negative.   Gastrointestinal: Negative.   Musculoskeletal: Positive for back pain and arthralgias.  Neurological: Negative.   Hematological: Negative.   Psychiatric/Behavioral: Negative.     Blood pressure 136/78, pulse 76, temperature 98 F (36.7 C), resp. rate 18, height 5\' 4"  (1.626 m), weight 195 lb (88.451 kg).  Physical Exam Physical Exam  Constitutional: She appears well-developed and well-nourished.  HENT:  Head: Normocephalic and  atraumatic.  Eyes: EOM are normal. Pupils are equal, round, and reactive to light.  Neck: Normal range  of motion. Neck supple.  Cardiovascular: Normal rate and regular rhythm.   Pulmonary/Chest: Effort normal and breath sounds normal.       Both breasts examined.not tender . Both quite dense. No obvious mass. Both nipple normal. No redness. No axillary adenopathy.  Chest wall muscle very tender especially left Skeletal muscle tender.  Skin: Skin is warm and dry.    Data Reviewed Mammogram and U/S breast 2.2013  [facility]  BIRADS 3   Follow up in august 2015 no change BIRADS 2   Assessment   Chest wall pain.  This is not from her breast   Plan    Secondary to fibromyalgia since pain elicited from palpating muscle.  Breast itself non tender.  Nothing else to offer since not primary breast issue at this point.  Recommend she discuss with other MD involved in managing her pain/ fibromyalgia. No suspicious lesions or other breast issue.        Leliana Kontz A. Whitley Strycharz 04/06/2014, 1:03 PM

## 2014-04-06 NOTE — Patient Instructions (Signed)
Breast is not source of pain.  Related to chest wall.  Not much I can offer unfortunately.  Will notify  Other MD.

## 2014-04-09 ENCOUNTER — Encounter: Payer: Self-pay | Admitting: Emergency Medicine

## 2014-04-18 ENCOUNTER — Telehealth (INDEPENDENT_AMBULATORY_CARE_PROVIDER_SITE_OTHER): Payer: Self-pay

## 2014-04-18 NOTE — Telephone Encounter (Signed)
LMOM> I believe Dr Brantley Stage routed office notes to Dr Estanislado Pandy. I did just fax over the notes just in case he didn't.

## 2014-04-18 NOTE — Telephone Encounter (Signed)
Message copied by Carlene Coria on Wed Apr 18, 2014  9:23 AM ------      Message from: Joya San      Created: Wed Apr 18, 2014  8:54 AM      Contact: [phone]       Pt wanted to know if the letter has been sent over to Dr. Estanislado Pandy concerning her pain in the breast? ------

## 2014-05-23 ENCOUNTER — Telehealth: Payer: Self-pay

## 2014-05-23 NOTE — Telephone Encounter (Signed)
  She has a prescription for a Mesh top shoe for fracture. She states she broke her first toe in the middle- it is just cracked. She went to step out of the car and the bottom fell off the boot. She has been to 3 different places and they do not carry these. What would you suggest? The ortho dr office does not have any more.   [facility] they do carry it. Advised pt location and number. She can't get over there today. She will try tomorrow.

## 2014-05-23 NOTE — Telephone Encounter (Signed)
Dr.Daub, Pt states she broke her toe Saturday and saw a different doctor and received a Boot to put on her foot, the boot that they gave her broke and she would like to know what she should do. Best# [phone]

## 2014-05-23 NOTE — Telephone Encounter (Signed)
PATIENT IS CALLING TO LET DR. DAUB KNOW THAT THERE HAS BEEN CONFUSION ABOUT WHERE TO GET THE RIGHT BOOT FOR HER LEG. SHE WAS TOLD TO GO TO [facility] AND SHE HAS BEEN BACK AND FOR WITH THEM ABOUT WHERE TO FIND THE RIGHT SIZE BOOT. SHE SPOKE TO A SOMEONE NAMED MCKAYLA. SHE WAS TOLD SHE COULD FIND THIS PARTICULAR BOOT AND [facility] OR [facility] AND PATIENT STILL DIDN'T HAVE ANY LUCK. PATIENT SAID THAT SHE WILL TAKE PILLS TO TRY AND DEAL WITH THE PAIN UNTIL SHE CAN FIND OUT WERE TO GET THE RIGHT BOOT FOR HER LEG.

## 2014-05-24 ENCOUNTER — Ambulatory Visit (HOSPITAL_COMMUNITY): Payer: Medicare Other | Admitting: Psychiatry

## 2014-05-24 NOTE — Telephone Encounter (Signed)
Pt altered her own shoe to provide support. She has been resting her foot and will follow up with her Ortho Dr.

## 2014-06-26 ENCOUNTER — Ambulatory Visit (INDEPENDENT_AMBULATORY_CARE_PROVIDER_SITE_OTHER): Payer: Medicare Other | Admitting: Psychiatry

## 2014-06-26 ENCOUNTER — Encounter (HOSPITAL_COMMUNITY): Payer: Self-pay | Admitting: Psychiatry

## 2014-06-26 VITALS — BP 134/92 | HR 84 | Ht 64.0 in | Wt 194.6 lb

## 2014-06-26 DIAGNOSIS — F4310 Post-traumatic stress disorder, unspecified: Secondary | ICD-10-CM

## 2014-06-26 DIAGNOSIS — F0630 Mood disorder due to known physiological condition, unspecified: Secondary | ICD-10-CM

## 2014-06-26 MED ORDER — DESVENLAFAXINE SUCCINATE ER 50 MG PO TB24: 50.0000 mg | ORAL_TABLET | Freq: Every day | ORAL | Status: DC

## 2014-06-26 MED ORDER — TRAZODONE HCL 100 MG PO TABS: 100.0000 mg | ORAL_TABLET | Freq: Every day | ORAL | Status: DC
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
## 2014-06-27 ENCOUNTER — Ambulatory Visit (INDEPENDENT_AMBULATORY_CARE_PROVIDER_SITE_OTHER): Payer: Medicare Other | Admitting: Internal Medicine

## 2014-06-27 ENCOUNTER — Encounter: Payer: Self-pay | Admitting: Internal Medicine

## 2014-06-27 VITALS — BP 130/82 | HR 73 | Ht 64.0 in | Wt 197.0 lb

## 2014-06-27 DIAGNOSIS — J9610 Chronic respiratory failure, unspecified whether with hypoxia or hypercapnia: Secondary | ICD-10-CM

## 2014-06-27 DIAGNOSIS — R0902 Hypoxemia: Secondary | ICD-10-CM

## 2014-06-27 DIAGNOSIS — J441 Chronic obstructive pulmonary disease with (acute) exacerbation: Secondary | ICD-10-CM

## 2014-06-27 DIAGNOSIS — J9611 Chronic respiratory failure with hypoxia: Secondary | ICD-10-CM | POA: Insufficient documentation

## 2014-06-27 MED ORDER — PREDNISONE 10 MG PO TABS: ORAL_TABLET | ORAL | Status: DC

## 2014-06-27 NOTE — Progress Notes (Signed)
Subjective:    Patient ID: Laurie Horton, female    DOB: 1946-05-07, 68 y.o.   MRN: [id number]  HPI 1. Smoker -  quit August 2011  reports that she quit smoking about 2 years ago. She does not have any smokeless tobacco history on file.   2. COPD   - with small PFO, exertional desat as of Sept 2011   - isolated low dlco on PFT Oct 2012 with emphysema on CT chest:  PFT 08/28/11 - shows isolated low dlco only 43%, Fev1 2.111/100%, ratio 60. - using oxygen 2013 following years of refusal  3. LLL pulmonary micronodule 0.6cm with subcentimeter mediastinal nodes  - dec 2010, aug 2011, oct 2012 - no further followup  4. Admissions for Resp failure/AECOPD   - Post p-lap for gall bladder - complicated by ICU resp failure and pneumonia LLL - March 2011, resolved on CT aug 2011 - RML PNA Sept 2012; resolved on CT oct 2012  5. Recurrent AECOPD  - aug/sept 2012 - Dr Everlene Farrier opd Rx - oct 2012 (office rx)  6. Poor health literacy, poly pharmacy, lives alone, pscyh issues  7. Code Status:  --DNAR, DNAI including no short term ventilation  - Oct 2012 pulm office visit. Thinks she will be dead by 2012-09-09 - changed to full code - July 2014 but " do not want to be a vegetable"    OV 08/01/2012  COPD - mild yellow sputum, increase cough and increased wheeze past  2 weeks. No change in dyspnea or effort tolerance. CAT score 20. Vaccine - pneumovax 2007 prior to age 78. FLu - not had yet. Reluctantly agreed to have both. She is worried she had 'flu' afeter flu shot in 2012-2013 season but I explained advantages and limitations of flu shot and she agreed.   Smoking - still in remission  #COPD You have mild attack of copd called COPD exacerbation Please take biaxin per your request No need for prednisone burst at this point. However, if this gets worse you might need prednisone but right now you do not Have flu shot and pneumovax vaccine 08/01/2012 today Continue your medications and  oxygen  #Smoking  - glad you are still quit  #Followup  9 months or sooner if needed  OV 05/02/2013  Routine COPD followup. Feels baseline. Compliant with nebs and oxygen. Smoking in remission. COPD cat score down to 15  New problem: wants pre-op resp clearance for some kind of spinalprocedure. DEscribing L2-L5 radiculopathy and says Dr Maia Petties is going to "burn nerve roots" because she is refractory or only has temp re;lief with epidural shots. Wants permanent relief. PCP DAUB, STEVE A, MD has cuationed against surgerygiven prior experience with post op resp failure. I did speak to Dr  Hope Pigeon. Saullo ahd he described 30 min RFA procedure on back with lidocaine +/- moderate sedation as tye typical procedure. She has had problems with moderate sedation before.  Preop risk assessment scores without use of sedation shows that her risk for pulmonary complications for this procedure is LOW    #COPD  - stable disease  - continue oxygen and nebs  - next visit do full PFT breathing test  #Preoperative pulmonray evaluation  - will talk to Dr Maia Petties; my nurse will reach his office now [phone]  - Will call you after I talk to him  #Followup  - 3 months with full PFT     OV 08/14/2013  Followup COPD. She says she  is stable. However, Score is 23.5 and appears worse than baseline. She says she is compliant with her ipratropium nebulizer and oxygen. He may function test today 08/14/2013 shows normal spirometry with FEV1 of 1.8 L/82%. FVC of 2.9 L 100%. Total lung capacity of 4.3 L/80%. She only has isolated reduction in diffusion capacity of 11.6/50%. She will have a flu shot today. She says last year she had a flu shot recommended by being and then she was in bed for 2 days but she denies any allergy to reaction and she is consented for a flu shot this time   OV 06/27/2014 - ACUTE VISIT  Chief Complaint  Patient presents with  . Acute Visit    Pt states her fibro myalgia is bothering her  chest wall. Pt states she has at times had to increase her O2 due to the heat. Pt c/o prod cough with cream colored mucous.Pt states when she takes a deep breath is hurts.    NOt seen her in 10 months. Acute visit now. Several months fibromylagia worse. Several weeks fibromyalgia in chest wall. Associated with this increased dyspnea to a moderate severity. She not sure if cough is worse but sputum is slightly different color x 2 weeks than baseline. Spurum volume might be same but she is not sure. Denies wheezing, fever, chills. USes nebsd and o2    CAT COPD Symptom & Quality of Life Score (GSK trademark) 0 is no burden. 5 is highest burden 08/01/2012 She is in aecopd 05/02/2013  06/27/2014   Never Cough -> Cough all the time 3 2 3   No phlegm in chest -> Chest is full of phlegm 3 0 4  No chest tightness -> Chest feels very tight 3 0 5, due to fibromyalgia per hx  No dyspnea for 1 flight stairs/hill -> Very dyspneic for 1 flight of stairs 2 5 3   No limitations for ADL at home -> Very limited with ADL at home 1 3 2   Confident leaving home -> Not at all confident leaving home 1 0 0  Sleep soundly -> Do not sleep soundly because of lung condition 3 0 3  Lots of Energy -> No energy at all 4 5 4   TOTAL Score (max 40)  20 15 24       Review of Systems  Constitutional: Negative for fever and unexpected weight change.  HENT: Negative for congestion, dental problem, ear pain, nosebleeds, postnasal drip, rhinorrhea, sinus pressure, sneezing, sore throat and trouble swallowing.   Eyes: Negative for redness and itching.  Respiratory: Positive for cough and shortness of breath. Negative for chest tightness and wheezing.   Cardiovascular: Positive for chest pain. Negative for palpitations and leg swelling.  Gastrointestinal: Negative for nausea and vomiting.  Genitourinary: Negative for dysuria.  Musculoskeletal: Negative for joint swelling.  Skin: Negative for rash.  Neurological: Negative for  headaches.  Hematological: Does not bruise/bleed easily.  Psychiatric/Behavioral: Negative for dysphoric mood. The patient is not nervous/anxious.    Current outpatient prescriptions:albuterol (PROVENTIL) (2.5 MG/3ML) 0.083% nebulizer solution, Take 2.5 mg by nebulization every 6 (six) hours as needed., Disp: , Rfl: ;  albuterol (VENTOLIN HFA) 108 (90 BASE) MCG/ACT inhaler, Inhale 2 puffs into the lungs every 4 (four) hours as needed for wheezing., Disp: 8.5 Inhaler, Rfl: 0;  aspirin 325 MG tablet, Take 325 mg by mouth daily.  , Disp: , Rfl:  budesonide (PULMICORT) 1 MG/2ML nebulizer solution, Take 1 mg by nebulization daily. Pt taking every 8 hours as  needed., Disp: , Rfl: ;  desvenlafaxine (PRISTIQ) 50 MG 24 hr tablet, Take 1 tablet (50 mg total) by mouth daily., Disp: 30 tablet, Rfl: 0;  diazepam (VALIUM) 5 MG tablet, Take 5 mg by mouth as needed for anxiety. , Disp: , Rfl: ;  fish oil-omega-3 fatty acids 1000 MG capsule, Take 1 g by mouth daily. , Disp: , Rfl:  glucosamine-chondroitin 500-400 MG tablet, Take 1 tablet by mouth daily. , Disp: , Rfl: ;  HYDROcodone-acetaminophen (NORCO/VICODIN) 5-325 MG per tablet, Take 1 tablet by mouth 2 (two) times daily., Disp: , Rfl: ;  lidocaine (LIDODERM) 5 %, Place 3 patches onto the skin as needed. Remove & Discard patch within 12 hours or as directed by MD, Disp: , Rfl: ;  montelukast (SINGULAIR) 10 MG tablet, TAKE 1 TABLET AT BEDTIME, Disp: 30 tablet, Rfl: 1 NORVASC 5 MG tablet, TAKE 1 TABLET BY MOUTH EVERY DAY, Disp: 30 tablet, Rfl: 3;  oxybutynin (DITROPAN) 5 MG tablet, Take 5 mg by mouth daily., Disp: , Rfl: ;  pantoprazole (PROTONIX) 40 MG tablet, Take 1 tablet by mouth daily., Disp: , Rfl: ;  polyethylene glycol (MIRALAX / GLYCOLAX) packet, Take 17 g by mouth daily as needed., Disp: , Rfl: ;  pregabalin (LYRICA) 75 MG capsule, Take 100 mg by mouth daily. , Disp: , Rfl:  PRESCRIPTION MEDICATION, Chlorhexidine 0.12% Swish with 1/2 oz by mouth for 30 seconds  then spit, bid, Disp: , Rfl: ;  tiZANidine (ZANAFLEX) 4 MG tablet, Take 2-4 mg by mouth every 6 (six) hours as needed for muscle spasms., Disp: , Rfl: ;  TOPROL XL 25 MG 24 hr tablet, TAKE 1/2 TABLET BY MOUTH EVERY DAY, Disp: 15 tablet, Rfl: 4;  traZODone (DESYREL) 100 MG tablet, Take 1 tablet (100 mg total) by mouth at bedtime., Disp: 30 tablet, Rfl: 0 ipratropium (ATROVENT) 0.02 % nebulizer solution, Take 500 mcg by nebulization as needed (thightness). , Disp: , Rfl:      Objective:   Physical Exam  Filed Vitals:   06/27/14 1008  BP: 130/82  Pulse: 73  Height: 5\' 4"  (1.626 m)  Weight: 197 lb (89.359 kg)  SpO2: 93%       Vitals reviewed. Constitutional: She is oriented to person, place, and time. She appears well-developed and well-nourished. No distress.       obese  HENT:  Head: Normocephalic and atraumatic.  Right Ear: External ear normal.  Left Ear: External ear normal.  Mouth/Throat: Normal Eyes: Conjunctivae and EOM are normal. Pupils are equal, round, and reactive to light. Right eye exhibits no discharge. Left eye exhibits no discharge. No scleral icterus.  Neck: Normal range of motion. Neck supple. No JVD present. No tracheal deviation present. No thyromegaly present.  Cardiovascular: Normal rate, regular rhythm, normal heart sounds and intact distal pulses.  Exam reveals no gallop and no friction rub.   No murmur heard. Pulmonary/Chest: Effort normal. No respiratory distress. She has no wheezes. She has no rales. She exhibits no tenderness.       Prolonged expiration No wheeze barrell chest  SEvere reproducibel tender spots all over chest Abdominal: Soft. Bowel sounds are normal. She exhibits no distension and no mass. There is no tenderness. There is no rebound and no guarding.  Musculoskeletal: Normal range of motion. She exhibits no edema and no tenderness.  Lymphadenopathy:    She has no cervical adenopathy.  Neurological: She is alert and oriented to person,  place, and time. She has normal reflexes. No  cranial nerve deficit. She exhibits normal muscle tone. Coordination normal.  Skin: Skin is warm and dry. No rash noted. She is not diaphoretic. No erythema. No pallor.  Psychiatric: She has a normal mood and affect. Her behavior is normal. Judgment and thought content normal.        Assessment & Plan:  Possible you are in copd flare up Please take prednisone 40 mg x1 day, then 30 mg x1 day, then 20 mg x1 day, then 10 mg x1 day, and then 5 mg x1 day and stop Will hold off antibiotics due to your multiple allergy history Continue o2 and your copd nebs as before  Followup - 3 months with NP Tammy

## 2014-06-27 NOTE — Patient Instructions (Addendum)
Possible you are in copd flare up but I am not sure and do not know how to handle fibromyalgia of chest wall Please take prednisone 40 mg x1 day, then 30 mg x1 day, then 20 mg x1 day, then 10 mg x1 day, and then 5 mg x1 day and stop Will hold off antibiotics due to your multiple allergy history Continue o2 and your copd nebs as before  Followup - 3 months with NP Tammy

## 2014-06-30 NOTE — Assessment & Plan Note (Signed)
Possible you are in copd flare up Please take prednisone 40 mg x1 day, then 30 mg x1 day, then 20 mg x1 day, then 10 mg x1 day, and then 5 mg x1 day and stop Will hold off antibiotics due to your multiple allergy history Continue o2 and your copd nebs as before  Followup - 3 months with NP Laurie Horton

## 2014-07-12 ENCOUNTER — Encounter: Payer: Self-pay | Admitting: Emergency Medicine

## 2014-07-12 ENCOUNTER — Ambulatory Visit (INDEPENDENT_AMBULATORY_CARE_PROVIDER_SITE_OTHER): Payer: Medicare Other | Admitting: Emergency Medicine

## 2014-07-12 VITALS — BP 120/80 | HR 74 | Temp 98.5°F | Resp 16 | Ht 63.5 in | Wt 196.2 lb

## 2014-07-12 DIAGNOSIS — K044 Acute apical periodontitis of pulpal origin: Secondary | ICD-10-CM

## 2014-07-12 DIAGNOSIS — IMO0001 Reserved for inherently not codable concepts without codable children: Secondary | ICD-10-CM

## 2014-07-12 DIAGNOSIS — G894 Chronic pain syndrome: Secondary | ICD-10-CM

## 2014-07-12 DIAGNOSIS — M797 Fibromyalgia: Secondary | ICD-10-CM

## 2014-07-12 DIAGNOSIS — J438 Other emphysema: Secondary | ICD-10-CM

## 2014-07-12 DIAGNOSIS — R0789 Other chest pain: Secondary | ICD-10-CM

## 2014-07-12 DIAGNOSIS — R071 Chest pain on breathing: Secondary | ICD-10-CM

## 2014-07-12 DIAGNOSIS — K047 Periapical abscess without sinus: Secondary | ICD-10-CM

## 2014-07-12 NOTE — Progress Notes (Signed)
   Subjective:    Patient ID: Laurie Horton, female    DOB: 09-17-1946, 68 y.o.   MRN: [id number]  HPI patient here to followup her fibromyalgia. She recently had a flare of her COPD and received a prednisone Dosepak. She has been to see Laurie Horton regarding her fibromyalgia but does not have those test results. She has had her bone density study done which had good results. She has an appointment to see Dr. Adele Horton who is her new psychiatrist since Dr. Sabra Horton retired. She is depressed and [name] but has had no suicidal thoughts. She brings her CD regarding her toe fracture. She has been under stress related to an infected tooth that is due to have an extraction today.   Review of Systems     Objective:   Physical Exam patient is cooperative. Neck is supple. Chest was clear. Heart regular rate no murmurs. Psychiatric status voice complaints of depression but no focal neurological signs.        Assessment & Plan:  No change in medications at present. She does continued to be depressed. I did look at her x-rays regarding her toe fracture but there is nothing else to do about this at the present time. She is going to the dentist when she leaves here to have an extraction of an infected tooth

## 2014-07-22 ENCOUNTER — Other Ambulatory Visit: Payer: Self-pay | Admitting: Physician Assistant

## 2014-07-24 ENCOUNTER — Ambulatory Visit (HOSPITAL_COMMUNITY): Payer: Self-pay | Admitting: Psychiatry

## 2014-08-02 DIAGNOSIS — M47816 Spondylosis without myelopathy or radiculopathy, lumbar region: Secondary | ICD-10-CM | POA: Insufficient documentation

## 2014-08-06 ENCOUNTER — Ambulatory Visit (HOSPITAL_COMMUNITY): Payer: Self-pay | Admitting: Psychiatry

## 2014-08-14 ENCOUNTER — Ambulatory Visit (INDEPENDENT_AMBULATORY_CARE_PROVIDER_SITE_OTHER): Payer: Medicare Other | Admitting: Psychiatry

## 2014-08-14 ENCOUNTER — Ambulatory Visit (HOSPITAL_COMMUNITY): Payer: Self-pay | Admitting: Psychiatry

## 2014-08-14 ENCOUNTER — Encounter (INDEPENDENT_AMBULATORY_CARE_PROVIDER_SITE_OTHER): Payer: Self-pay

## 2014-08-14 ENCOUNTER — Encounter (HOSPITAL_COMMUNITY): Payer: Self-pay | Admitting: Psychiatry

## 2014-08-14 VITALS — BP 140/76 | HR 60 | Ht 63.0 in | Wt 200.0 lb

## 2014-08-14 DIAGNOSIS — F0630 Mood disorder due to known physiological condition, unspecified: Secondary | ICD-10-CM

## 2014-08-14 DIAGNOSIS — F39 Unspecified mood [affective] disorder: Secondary | ICD-10-CM

## 2014-08-14 DIAGNOSIS — F4310 Post-traumatic stress disorder, unspecified: Secondary | ICD-10-CM

## 2014-08-14 MED ORDER — TRAZODONE HCL 150 MG PO TABS
150.0000 mg | ORAL_TABLET | Freq: Every day | ORAL | Status: DC
Start: 2014-08-14 — End: 2014-10-15

## 2014-08-14 MED ORDER — DESVENLAFAXINE SUCCINATE ER 50 MG PO TB24: 50.0000 mg | ORAL_TABLET | Freq: Every day | ORAL | Status: DC

## 2014-08-14 NOTE — Progress Notes (Signed)
[facility] [phone] Progress Note   Laurie Horton [id number] 69 y.o.  08/14/2014  9:13 AM  Chief Complaint:  I cannot sleep all night.  I'm stressing out.    History of Present Illness:  Laurie Horton came for her followup appointment.  She was seen first time on August 25 as initial evaluation.  She was seeing Dr. pool however this practices closed she needed a new psychiatrist.  The patient told last 2 weeks was ready difficult.  Her grandson got married 3 weeks ago.  Patient was not happy because his wife took over all the arrangements .  After the wedding patient was crying .  She's been reporting poor sleep and racing thoughts.  Recently she has seen her primary care physician and given pain medication because she is complaining of shoulder pain.  She is taking her medication as prescribed sometimes she has crying spells, irritability and depressive thoughts.  She is reluctant to increase her antidepressant however after some encouragement she is willing to increase her trazodone .  She is also taking their and she is not happy because it is causing weight gain .  Patient is taking multiple medications.  She continues to have chronic pain and sometimes she is very isolated and withdrawn because of pain.  She is seeing a rheumatologist for that.  She endorse sadness and depressive symptoms but denies any active or passive suicidal thoughts or homicidal thoughts.  She has no tremors or shakes.  She is not interested in seeing therapist because of financial strain.  Her appetite is okay.  Her vitals are stable.  She continued to endorse a lot of rumination about her family issues.  She has not seen her son since age 51 because he was involved in drugs.  She tried to help her daughter financially because of grandsons admitting but did not like his grandson's wife attitude.  She is taking Valium only for severe panic attack and in past 2 weeks she has taken 2 or 3 times about him.  Patient is  divorced and lives by herself.  Her daughter lives close by.  Suicidal Ideation: No Plan Formed: No Patient has means to carry out plan: No  Homicidal Ideation: No Plan Formed: No Patient has means to carry out plan: No   Past Psychiatric History/Hospitalization(s) Patient denies any previous history of psychiatric inpatient treatment, suicidal attempt, paranoia, hallucination or any mania.  She started seeing psychiatrist in 2006 after she had a stroke.  She had tried Cymbalta and Celexa in the past.  She also tried Ambien, Lunesta and Xanax but developed allergies and side effects. Anxiety: Yes Bipolar Disorder: No Depression: Yes Mania: No Psychosis: No Schizophrenia: No Personality Disorder: No Hospitalization for psychiatric illness: No History of Electroconvulsive Shock Therapy: No Prior Suicide Attempts: No  Medical History; Patient has hypertension, fibromyalgia, scoliosis, headaches and history of stroke.  She has some weakness on her right hand which is minimal.  She also have loss of peripheral vision.  Her primary care physician is Dr. Everlene Farrier.  Substance Abuse History; Patient denies any history of substance use.   Review of Systems  Constitutional: Positive for malaise/fatigue. Negative for weight loss.  Musculoskeletal: Positive for back pain and joint pain.  Skin: Negative.   Neurological:       Slight weakness in her right side  Psychiatric/Behavioral: Positive for depression. The patient is nervous/anxious and has insomnia.    Psychiatric: Agitation: No Hallucination: No Depressed Mood: Yes Insomnia: Yes  Hypersomnia: No Altered Concentration: No Feels Worthless: No Grandiose Ideas: No Belief In Special Powers: No New/Increased Substance Abuse: No Compulsions: No  Neurologic: Headache: Yes Seizure: No Paresthesias: No   Musculoskeletal: Strength & Muscle Tone: Patient endorsed some weakness in her right side but it is very minimal. Gait & Station:  She has some difficulty in walking because of right-sided weakness. Patient leans: Front   Outpatient Encounter Prescriptions as of 08/14/2014  Medication Sig  . albuterol (PROVENTIL) (2.5 MG/3ML) 0.083% nebulizer solution Take 2.5 mg by nebulization every 6 (six) hours as needed.  Marland Kitchen albuterol (VENTOLIN HFA) 108 (90 BASE) MCG/ACT inhaler Inhale 2 puffs into the lungs every 4 (four) hours as needed for wheezing.  Marland Kitchen aspirin 325 MG tablet Take 325 mg by mouth daily.    . budesonide (PULMICORT) 1 MG/2ML nebulizer solution Take 1 mg by nebulization daily. Pt taking every 8 hours as needed.  . desvenlafaxine (PRISTIQ) 50 MG 24 hr tablet Take 1 tablet (50 mg total) by mouth daily.  . diazepam (VALIUM) 5 MG tablet Take 5 mg by mouth as needed for anxiety.   . fish oil-omega-3 fatty acids 1000 MG capsule Take 1 g by mouth daily.   Marland Kitchen glucosamine-chondroitin 500-400 MG tablet Take 1 tablet by mouth daily.   Marland Kitchen HYDROcodone-acetaminophen (NORCO/VICODIN) 5-325 MG per tablet Take 1 tablet by mouth 2 (two) times daily.  Marland Kitchen ipratropium (ATROVENT) 0.02 % nebulizer solution Take 500 mcg by nebulization as needed (thightness).   Marland Kitchen lidocaine (LIDODERM) 5 % Place 3 patches onto the skin as needed. Remove & Discard patch within 12 hours or as directed by MD  . montelukast (SINGULAIR) 10 MG tablet TAKE 1 TABLET AT BEDTIME  . NORVASC 5 MG tablet TAKE 1 TABLET BY MOUTH EVERY DAY  . oxybutynin (DITROPAN) 5 MG tablet Take 5 mg by mouth daily.  . pantoprazole (PROTONIX) 40 MG tablet Take 1 tablet by mouth daily.  . polyethylene glycol (MIRALAX / GLYCOLAX) packet Take 17 g by mouth daily as needed.  . predniSONE (DELTASONE) 10 MG tablet 40mg  x 1 day, then 30mg  x 1, then 20mg  x 1 day, then 10mg  x 1 day, and then 5 mg x 1 day then stop.  . pregabalin (LYRICA) 75 MG capsule Take 100 mg by mouth daily.   Marland Kitchen PRESCRIPTION MEDICATION Chlorhexidine 0.12% Swish with 1/2 oz by mouth for 30 seconds then spit, bid  . tiZANidine  (ZANAFLEX) 4 MG tablet Take 2-4 mg by mouth every 6 (six) hours as needed for muscle spasms.  . TOPROL XL 25 MG 24 hr tablet TAKE 1/2 TABLET BY MOUTH EVERY DAY  . traZODone (DESYREL) 150 MG tablet Take 1 tablet (150 mg total) by mouth at bedtime.  . [DISCONTINUED] desvenlafaxine (PRISTIQ) 50 MG 24 hr tablet Take 1 tablet (50 mg total) by mouth daily.  . [DISCONTINUED] montelukast (SINGULAIR) 10 MG tablet TAKE 1 TABLET BY MOUTH AT BEDTIME  . [DISCONTINUED] traZODone (DESYREL) 100 MG tablet Take 1 tablet (100 mg total) by mouth at bedtime.    No results found for this or any previous visit (from the past 2160 hour(s)).    Constitutional:  BP 140/76  Pulse 60  Ht 5\' 3"  (1.6 m)  Wt 200 lb (90.719 kg)  BMI 35.44 kg/m2   Mental Status Examination;  Patient is casually dressed and fairly groomed.  Her speech is fast, clear and coherent.  She described her mood sad depressed and her affect is constricted.  She denies  any auditory or visual hallucination.  She denies any active or passive suicidal thoughts or homicidal thoughts.  There are no delusions, paranoia or any obsessive thoughts although she has a lot of ruminative thoughts.  Her attention and concentration is fair.  Her psychomotor activity is slightly increased.  There were no flight of ideas or any loose association.  Her thought process is circumstantial.  Her fund of knowledge is adequate.  She is alert and oriented x3.  Her cognition is grossly intact.  Her insight judgment and impulse control is okay.   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 Depression    . GERD (gastroesophageal reflux disease)   . HTN (hypertension)   . Hiatal hernia   . COPD (chronic obstructive pulmonary disease)   . Diverticulitis   . DVT (deep venous thrombosis)   . Obesity   . Hyperlipidemia   . PFO (patent foramen ovale)   . PAT (paroxysmal atrial tachycardia)   . Right middle lobe pneumonia 07/24/2011    First noted at admit 07/10/11. Persists on cxr 07/22/11. Cleared on CT 08/24/11. No further followup  . PULMONARY NODULE, LEFT LOWER LOBE 10/14/2009    49mm LLL nodule dec 2010. Stable and 88mm in Oct 2012. No further fu  . TOBACCO ABUSE 06/04/2009  . Osteoporosis   . Fibromyalgia   . Stroke     Axis IV: Moderate   Plan:  We are still awaiting records from Dr. Iona Coach office.  Reassurance given.  Recommended to increase trazodone 150 mg at bedtime to help her sleep and racing thoughts.  Patient does not want to change her current dose of Prestiq 50 mg daily.  She still has Valium which she is using as needed.  One more time I offer counseling but patient declined.  Discussed medication side effects and benefits.  Recommended to call us back if she has any question or any concern.  I will see her again in 2 months. Time spent 25 minutes.  More than 50% of the time spent in psychoeducation, counseling and coordination of care.  Discuss safety plan that anytime having active suicidal thoughts or homicidal thoughts then patient need to call 911 or go to the local emergency room.   Jeanclaude Wentworth T., MD 08/14/2014

## 2014-08-22 ENCOUNTER — Other Ambulatory Visit: Payer: Self-pay | Admitting: Physician Assistant

## 2014-08-23 ENCOUNTER — Other Ambulatory Visit: Payer: Self-pay | Admitting: Physician Assistant

## 2014-08-30 ENCOUNTER — Ambulatory Visit: Payer: Medicare Other | Admitting: Emergency Medicine

## 2014-09-03 ENCOUNTER — Ambulatory Visit (INDEPENDENT_AMBULATORY_CARE_PROVIDER_SITE_OTHER): Payer: Medicare Other | Admitting: Emergency Medicine

## 2014-09-03 ENCOUNTER — Encounter: Payer: Self-pay | Admitting: Emergency Medicine

## 2014-09-03 VITALS — BP 114/79 | HR 84 | Temp 98.4°F | Resp 16 | Ht 63.5 in | Wt 199.0 lb

## 2014-09-03 DIAGNOSIS — R42 Dizziness and giddiness: Secondary | ICD-10-CM

## 2014-09-03 DIAGNOSIS — Z23 Encounter for immunization: Secondary | ICD-10-CM

## 2014-09-03 NOTE — Addendum Note (Signed)
Addended by: Arlyss Queen A on: 09/03/2014 10:35 AM   Modules accepted: Level of Service

## 2014-09-03 NOTE — Progress Notes (Signed)
   Subjective:    Patient ID: Laurie Horton, female    DOB: Apr 20, 1946, 68 y.o.   MRN: [id number]  HPIpatient in for follow-up. Her major complaint is of dizziness. She states her whole chest wall is sore. She denies any exertional chest pain and her shortness of breath has been about the same. She has had recent changes in her insomnia medication. She is concerned about possibly falling in the shower. She also has had a recent significant weight gain.    Review of Systems     Objective:   Physical Exampatient is alert and cooperative. Her neck is supple. Chest revealed diminished breath sounds in the bases. Cardiac revealed a 2/6 systolic murmur at the base of the heart. Abdomen is protuberant without mass extremities without edema.she has tenderness to the lightest touch over the entire chest wall.  EKG shows diffuse anteroseptal ST-T changes unchanged from previous. She does have a sinus rhythm with PACs and PVCs.      Assessment & Plan:  Patient having significant difficulty with her fibromyalgia and chest wall discomfort. She is in sinus rhythm. She does have some orthostatic changes and I advised her about how to get up slowly and stabilize herself before she takes off. I advised her to continue to use her shower stool and to get help with the shower equipment so she does not fall in the shower. Advised her to talk with her psychiatrist regarding her recent worsening dizziness.flu shot was given

## 2014-09-19 ENCOUNTER — Encounter (HOSPITAL_COMMUNITY): Payer: Self-pay | Admitting: Emergency Medicine

## 2014-09-19 ENCOUNTER — Emergency Department (HOSPITAL_COMMUNITY): Payer: Medicare Other

## 2014-09-19 ENCOUNTER — Emergency Department (HOSPITAL_COMMUNITY)
Admission: EM | Admit: 2014-09-19 | Discharge: 2014-09-19 | Disposition: A | Discharge status: Home / Self Care | Payer: Medicare Other | Attending: Emergency Medicine | Admitting: Emergency Medicine

## 2014-09-19 ENCOUNTER — Ambulatory Visit (INDEPENDENT_AMBULATORY_CARE_PROVIDER_SITE_OTHER): Payer: Medicare Other | Admitting: Emergency Medicine

## 2014-09-19 ENCOUNTER — Other Ambulatory Visit: Payer: Self-pay | Admitting: Cardiology

## 2014-09-19 VITALS — BP 140/74 | HR 80 | Temp 98.3°F | Resp 20

## 2014-09-19 DIAGNOSIS — Z79899 Other long term (current) drug therapy: Secondary | ICD-10-CM | POA: Insufficient documentation

## 2014-09-19 DIAGNOSIS — I498 Other specified cardiac arrhythmias: Secondary | ICD-10-CM

## 2014-09-19 DIAGNOSIS — Z87891 Personal history of nicotine dependence: Secondary | ICD-10-CM | POA: Diagnosis not present

## 2014-09-19 DIAGNOSIS — R42 Dizziness and giddiness: Secondary | ICD-10-CM

## 2014-09-19 DIAGNOSIS — E669 Obesity, unspecified: Secondary | ICD-10-CM | POA: Diagnosis not present

## 2014-09-19 DIAGNOSIS — K219 Gastro-esophageal reflux disease without esophagitis: Secondary | ICD-10-CM | POA: Diagnosis not present

## 2014-09-19 DIAGNOSIS — Z8639 Personal history of other endocrine, nutritional and metabolic disease: Secondary | ICD-10-CM | POA: Insufficient documentation

## 2014-09-19 DIAGNOSIS — Z8701 Personal history of pneumonia (recurrent): Secondary | ICD-10-CM | POA: Insufficient documentation

## 2014-09-19 DIAGNOSIS — Q211 Atrial septal defect: Secondary | ICD-10-CM | POA: Insufficient documentation

## 2014-09-19 DIAGNOSIS — Z7982 Long term (current) use of aspirin: Secondary | ICD-10-CM | POA: Diagnosis not present

## 2014-09-19 DIAGNOSIS — J441 Chronic obstructive pulmonary disease with (acute) exacerbation: Secondary | ICD-10-CM | POA: Diagnosis not present

## 2014-09-19 DIAGNOSIS — I471 Supraventricular tachycardia: Secondary | ICD-10-CM | POA: Insufficient documentation

## 2014-09-19 DIAGNOSIS — Z86718 Personal history of other venous thrombosis and embolism: Secondary | ICD-10-CM | POA: Diagnosis not present

## 2014-09-19 DIAGNOSIS — I10 Essential (primary) hypertension: Secondary | ICD-10-CM | POA: Insufficient documentation

## 2014-09-19 DIAGNOSIS — Z8673 Personal history of transient ischemic attack (TIA), and cerebral infarction without residual deficits: Secondary | ICD-10-CM | POA: Diagnosis not present

## 2014-09-19 DIAGNOSIS — J438 Other emphysema: Secondary | ICD-10-CM | POA: Diagnosis present

## 2014-09-19 DIAGNOSIS — M797 Fibromyalgia: Secondary | ICD-10-CM | POA: Diagnosis not present

## 2014-09-19 DIAGNOSIS — I480 Paroxysmal atrial fibrillation: Secondary | ICD-10-CM

## 2014-09-19 DIAGNOSIS — R0602 Shortness of breath: Secondary | ICD-10-CM

## 2014-09-19 DIAGNOSIS — R9431 Abnormal electrocardiogram [ECG] [EKG]: Secondary | ICD-10-CM

## 2014-09-19 LAB — BASIC METABOLIC PANEL
Anion gap: 13 (ref 5–15)
BUN: 15 mg/dL (ref 6–23)
CALCIUM: 9.6 mg/dL (ref 8.4–10.5)
CO2: 28 meq/L (ref 19–32)
Chloride: 98 mEq/L (ref 96–112)
Creatinine, Ser: 0.81 mg/dL (ref 0.50–1.10)
GFR calc Af Amer: 85 mL/min — ABNORMAL LOW (ref >90)
GFR, EST NON AFRICAN AMERICAN: 73 mL/min — AB (ref >90)
Glucose, Bld: 92 mg/dL (ref 70–99)
Potassium: 4.2 mEq/L (ref 3.7–5.3)
Sodium: 139 mEq/L (ref 137–147)

## 2014-09-19 LAB — CBC
HCT: 41.3 % (ref 36.0–46.0)
HEMOGLOBIN: 13.7 g/dL (ref 12.0–15.0)
MCH: 30.9 pg (ref 26.0–34.0)
MCHC: 33.2 g/dL (ref 30.0–36.0)
MCV: 93 fL (ref 78.0–100.0)
Platelets: 214 10*3/uL (ref 150–400)
RBC: 4.44 MIL/uL (ref 3.87–5.11)
RDW: 12.9 % (ref 11.5–15.5)
WBC: 7.1 10*3/uL (ref 4.0–10.5)

## 2014-09-19 LAB — PRO B NATRIURETIC PEPTIDE: Pro B Natriuretic peptide (BNP): 129 pg/mL — ABNORMAL HIGH (ref 0–125)

## 2014-09-19 LAB — I-STAT TROPONIN, ED: Troponin i, poc: 0 ng/mL (ref 0.00–0.08)

## 2014-09-19 MED ORDER — HYDROCODONE-ACETAMINOPHEN 5-325 MG PO TABS
1.0000 | ORAL_TABLET | Freq: Once | ORAL | Status: AC
  Administered 2014-09-19: 1 via ORAL
  Filled 2014-09-19: qty 1

## 2014-09-19 MED ORDER — RIVAROXABAN (XARELTO) EDUCATION KIT FOR DVT/PE PATIENTS
PACK | Freq: Once | Status: DC
Start: 2014-09-19 — End: 2014-09-19

## 2014-09-19 MED ORDER — RIVAROXABAN 20 MG PO TABS: 20.0000 mg | ORAL_TABLET | Freq: Every day | ORAL | Status: DC

## 2014-09-19 MED ORDER — RIVAROXABAN 15 MG PO TABS
15.0000 mg | ORAL_TABLET | Freq: Once | ORAL | Status: DC
Start: 2014-09-19 — End: 2014-09-19
  Filled 2014-09-19: qty 1

## 2014-09-19 MED ORDER — XARELTO VTE STARTER PACK 15 & 20 MG PO TBPK: 15.0000 mg | ORAL_TABLET | ORAL | Status: DC

## 2014-09-19 MED ORDER — RIVAROXABAN 20 MG PO TABS
20.0000 mg | ORAL_TABLET | Freq: Once | ORAL | Status: AC
  Administered 2014-09-19: 20 mg via ORAL
  Filled 2014-09-19: qty 1

## 2014-09-19 MED ORDER — HEART RATE MONITOR MISC: 1.0000 [IU] | Status: DC

## 2014-09-19 NOTE — ED Notes (Signed)
Pharmacist at bedside.

## 2014-09-19 NOTE — H&P (Signed)
Chief Complaint: Palpitations + Dizziness Primary cardiologist: Dr. Martinique   HPI: The patient is a 68 year old female, followed by Dr. Martinique, with a history of severe COPD on home oxygen (1.5 L nasal cannula), hypertension, hyperlipidemia and fibromyalgia. She also has a history of stroke that occurred after undergoing knee surgery in 2006. Evaluation at that time revealed a PFO by TEE. She was on Coumadin therapy, however this was discontinued by her neurologist several years ago. She has since been maintained on aspirin therapy. Her last ischemic evaluation was a nuclear stress test performed in July 2014. This was negative for ischemia and demonstrated normal systolic function. She is followed medically by Dr. Everlene Farrier. She was seen in his office today for evaluation of intermittent palpitations and dizziness that have been occurring over the last 2 weeks. She denies syncope/near-syncope. No increased dyspnea beyond her baseline. She notes frequent left-sided chest discomfort, however she feels this may be related to her fibromyalgia. There is no particular pattern to her pain. It can occur with or without exertion.   Per Dr. Perfecto Kingdom progress note from today, the patient's EKG demonstrated diffuse anteroseptal T wave changes with new development of inferior Q waves, not present on tracings 3 weeks ago. EMS was called to transport the patient to [facility] for further evaluation. During transport, the patient reportedly developed atrial fibrillation with a rapid ventricular response. Per office records, this spontaneously converted after approximately 30 seconds. Rate was 180 bpm when it started. Since arrival to the [facility] emergency department, no recurrent atrial fibrillation has been captured on telemetry. The patient continues to note intermittent episodes of dizziness, lasting several seconds. She is currently chest pain-free. Blood pressure is stable in the 536R systolic. HR has remained stable in  the 60s.   The patient denies any recent viral illnesses, fevers or chills. No dysuria, flank or low back pain. No excessive caffeine or alcohol intake. She reports full medication compliance. Labs are unremarkable, including troponin x 1. CXR is normal.   Past Medical History  Diagnosis Date  . Depression   . GERD (gastroesophageal reflux disease)   . HTN (hypertension)   . Hiatal hernia   . COPD (chronic obstructive pulmonary disease)   . Diverticulitis   . DVT (deep venous thrombosis)   . Obesity   . Hyperlipidemia   . PFO (patent foramen ovale)   . PAT (paroxysmal atrial tachycardia)   . Right middle lobe pneumonia 07/24/2011    First noted at admit 07/10/11. Persists on cxr 07/22/11. Cleared on CT 08/24/11. No further followup  . PULMONARY NODULE, LEFT LOWER LOBE 10/14/2009    72m LLL nodule dec 2010. Stable and 473min Oct 2012. No further fu  . TOBACCO ABUSE 06/04/2009  . Osteoporosis   . Fibromyalgia   . Stroke     Past Surgical History  Procedure Laterality Date  . Total abdominal hysterectomy      post op needed oxygen was told "she gave them a scare"  . Knee arthroscopy  2000    left  . Laparoscopic cholecystectomy  04-16-2010    [facility]  . Tubal ligation    . UsKoreachocardiography  11/20/2009    EF 55-60%  . Cardiovascular stress test  12/26/2004    EF 74%. NO EVIDENCE OF ISCHEMIA    Family History  Problem Relation Age of Onset  . Dementia Mother   . Diabetes Mother   . Alzheimer's disease Mother   . Heart attack Brother [id number].  Schizophrenia Sister   . Diabetes Sister   . Tremor Sister   . Heart attack Father    Social History:  reports that she quit smoking about 4 years ago. She does not have any smokeless tobacco history on file. She reports that she does not drink alcohol or use illicit drugs.  Allergies:  Allergies  Allergen Reactions  . Alprazolam     REACTION: stops breathing  . Budesonide-Formoterol Fumarate     bliusters  . Aciphex [Rabeprazole  Sodium]     rash  . Ambien [Zolpidem]   . Avelox [Moxifloxacin Hcl In Nacl]   . Esomeprazole Magnesium     REACTION: "bouncing off walls"  . Eszopiclone   . Iodine     REACTION: swelling in throat  . Loratadine     claritin D causes shaking  . Lotrimin [Clotrimazole]   . Lunesta [Eszopiclone]     REACTION: "slept for a week"  . Oxcarbazepine Other (See Comments)    Causes deep sleep and dizziness  . Pseudoephedrine Hcl Er   . Zolpidem Tartrate     REACTION: "slept for a week"  . Bextra [Valdecoxib] Rash  . Ceclor [Cefaclor] Rash  . Covera-Hs [Verapamil Hcl] Palpitations  . Dicyclomine Hcl Rash  . Tessalon Perles Rash     (Not in a hospital admission)  Results for orders placed or performed during the hospital encounter of 09/19/14 (from the past 48 hour(s))  CBC     Status: None   Collection Time: 09/19/14 10:56 AM  Result Value Ref Range   WBC 7.1 4.0 - 10.5 K/uL   RBC 4.44 3.87 - 5.11 MIL/uL   Hemoglobin 13.7 12.0 - 15.0 g/dL   HCT 41.3 36.0 - 46.0 %   MCV 93.0 78.0 - 100.0 fL   MCH 30.9 26.0 - 34.0 pg   MCHC 33.2 30.0 - 36.0 g/dL   RDW 12.9 11.5 - 15.5 %   Platelets 214 150 - 400 K/uL  Basic metabolic panel     Status: Abnormal   Collection Time: 09/19/14 10:56 AM  Result Value Ref Range   Sodium 139 137 - 147 mEq/L   Potassium 4.2 3.7 - 5.3 mEq/L   Chloride 98 96 - 112 mEq/L   CO2 28 19 - 32 mEq/L   Glucose, Bld 92 70 - 99 mg/dL   BUN 15 6 - 23 mg/dL   Creatinine, Ser 0.81 0.50 - 1.10 mg/dL   Calcium 9.6 8.4 - 10.5 mg/dL   GFR calc non Af Amer 73 (L) >90 mL/min   GFR calc Af Amer 85 (L) >90 mL/min    Comment: (NOTE) The eGFR has been calculated using the CKD EPI equation. This calculation has not been validated in all clinical situations. eGFR's persistently <90 mL/min signify possible Chronic Kidney Disease.    Anion gap 13 5 - 15  Pro b natriuretic peptide     Status: Abnormal   Collection Time: 09/19/14 10:56 AM  Result Value Ref Range   Pro B  Natriuretic peptide (BNP) 129.0 (H) 0 - 125 pg/mL  I-stat troponin, ED (not at [facility])     Status: None   Collection Time: 09/19/14 11:01 AM  Result Value Ref Range   Troponin i, poc 0.00 0.00 - 0.08 ng/mL   Comment 3            Comment: Due to the release kinetics of cTnI, a negative result within the first hours of the onset of symptoms does not  rule out myocardial infarction with certainty. If myocardial infarction is still suspected, repeat the test at appropriate intervals.    Dg Chest 2 View  09/19/2014   CLINICAL DATA:  69 year old female with shortness of breath and dizziness. Initial encounter.  EXAM: CHEST  2 VIEW  COMPARISON:  03/08/2014 and earlier.  FINDINGS: Stable cardiomegaly and mediastinal contours. Stable tortuosity of the descending thoracic aorta. Other mediastinal contours are within normal limits. Visualized tracheal air column is within normal limits. Chronic increased interstitial markings. Stable lung volumes. No pneumothorax, pulmonary edema, pleural effusion or confluent pulmonary opacity. Stable cholecystectomy clips. Calcified atherosclerosis of the abdominal aorta. No acute osseous abnormality identified.  IMPRESSION: No acute cardiopulmonary abnormality.   Electronically Signed   By: Lars Pinks M.D.   On: 09/19/2014 11:43    Review of Systems  Constitutional: Negative for fever and chills.  Respiratory: Positive for shortness of breath (chronic).   Cardiovascular: Positive for chest pain and palpitations. Negative for orthopnea, leg swelling and PND.  Genitourinary: Negative for dysuria.  Neurological: Positive for dizziness. Negative for loss of consciousness.  All other systems reviewed and are negative.   Blood pressure 137/77, pulse 62, temperature 98 F (36.7 C), temperature source Oral, resp. rate 17, height '5\' 3"'  (1.6 m), weight 196 lb (88.905 kg), SpO2 86 %. Physical Exam  Constitutional: She is oriented to person, place, and time. She appears  well-developed and well-nourished. No distress.  Neck: No JVD present. Carotid bruit is not present.  Cardiovascular: Normal rate, regular rhythm, normal heart sounds and intact distal pulses.  Exam reveals no gallop and no friction rub.   No murmur heard. Respiratory: Effort normal and breath sounds normal. No respiratory distress. She has no wheezes. She has no rales.  GI: Soft. Bowel sounds are normal. She exhibits no distension and no mass.  Musculoskeletal: She exhibits no edema.  Neurological: She is alert and oriented to person, place, and time.  Skin: Skin is warm and dry. She is not diaphoretic.  Psychiatric: She has a normal mood and affect. Her behavior is normal.     Assessment/Plan Dizziness Palpitations   1. ? Arrhthymia: There was concern for possible Afib w/ RVR earlier today at PCP office. However, after review of telemetry strips, the underlying rhythm looked more consistent with multifocal atrial tachycardia, which is common with CODP. Will have MD to review strip for verification. No atrial fibrillation has been captured on telemetry while in the ED. HR is currently stable in the 60s. Recommend further cardiac monitoring. It would probably be best to admit for observation to ensure she is not going in and out of Afib. If no findings overnight, would recommend outpatient heart monitor. Check TSH. Obtain a 2D echo. If evidence of atrial fibrillation, she will need to be placed back on anticoagulation given her CHADS VASC score of 5 ( hypertension, prior history of stroke, age 80-74 and female sex). If she has any further tachycardia, can consider treatment with a CCB. She is currently on Norvasc. Would need to discontinue and place on a CCB with AV nodal blockade properties (Cardizem). Will avoid beta blockers given her severe COPD. MD to follow with further recommendations.  SIMMONS, [place] 09/19/2014, 1:31 PM

## 2014-09-19 NOTE — ED Notes (Signed)
Tatyana, PA at the bedside  

## 2014-09-19 NOTE — ED Provider Notes (Signed)
CSN: [id number]     Arrival date & time 09/19/14  1017 History   First MD Initiated Contact with Patient 09/19/14 1020     Chief Complaint  Patient presents with  . Dizziness  . Shortness of Breath     (Consider location/radiation/quality/duration/timing/severity/associated sxs/prior Treatment) HPI Laurie Horton is a 68 y.o. female who presents to emergency department complaining of dizziness for 2 weeks. Patient reports near syncopal episode 2 days ago while at the grocery store. Patient states she went to see her primary care doctor today, there was found to have intermittent A. Fib, was transferred here. Patient does report intermittent palpitations. She denies any chest pain or increased shortness of breath. She states she has chronic chest wall pain from her fibromyalgia, but there was no change in her pain. She has chronic shortness of breath related to her COPD, she wears oxygen at all times. She took 325 mg aspirin this morning. She was transferred here from primary care doctor's office.  Past Medical History  Diagnosis Date  . Depression   . GERD (gastroesophageal reflux disease)   . HTN (hypertension)   . Hiatal hernia   . COPD (chronic obstructive pulmonary disease)   . Diverticulitis   . DVT (deep venous thrombosis)   . Obesity   . Hyperlipidemia   . PFO (patent foramen ovale)   . PAT (paroxysmal atrial tachycardia)   . Right middle lobe pneumonia 07/24/2011    First noted at admit 07/10/11. Persists on cxr 07/22/11. Cleared on CT 08/24/11. No further followup  . PULMONARY NODULE, LEFT LOWER LOBE 10/14/2009    48mm LLL nodule dec 2010. Stable and 52mm in Oct 2012. No further fu  . TOBACCO ABUSE 06/04/2009  . Osteoporosis   . Fibromyalgia   . Stroke    Past Surgical History  Procedure Laterality Date  . Total abdominal hysterectomy      post op needed oxygen was told "she gave them a scare"  . Knee arthroscopy  2000    left  . Laparoscopic cholecystectomy  04-16-2010     [facility]  . Tubal ligation    . US echocardiography  11/20/2009    EF 55-60%  . Cardiovascular stress test  12/26/2004    EF 74%. NO EVIDENCE OF ISCHEMIA   Family History  Problem Relation Age of Onset  . Dementia Mother   . Diabetes Mother   . Alzheimer's disease Mother   . Heart attack Brother 74  . Schizophrenia Sister   . Diabetes Sister   . Tremor Sister   . Heart attack Father    History  Substance Use Topics  . Smoking status: Former Smoker    Quit date: 06/02/2010  . Smokeless tobacco: Not on file  . Alcohol Use: No   OB History    No data available     Review of Systems  Constitutional: Negative for fever and chills.  Respiratory: Positive for shortness of breath. Negative for cough and chest tightness.   Cardiovascular: Negative for chest pain, palpitations and leg swelling.  Gastrointestinal: Negative for nausea, vomiting, abdominal pain and diarrhea.  Genitourinary: Negative for dysuria, flank pain and pelvic pain.  Musculoskeletal: Negative for myalgias, arthralgias, neck pain and neck stiffness.  Skin: Negative for rash.  Neurological: Positive for dizziness and light-headedness. Negative for weakness and headaches.  All other systems reviewed and are negative.     Allergies  Alprazolam; Budesonide-formoterol fumarate; Aciphex; Ambien; Avelox; Esomeprazole magnesium; Eszopiclone; Iodine; Loratadine; Lotrimin; Lunesta; Oxcarbazepine;  Pseudoephedrine hcl er; Zolpidem tartrate; Bextra; Ceclor; Covera-hs; Dicyclomine hcl; and Tessalon perles  Home Medications   Prior to Admission medications   Medication Sig Start Date End Date Taking? Authorizing Provider  albuterol (PROVENTIL) (2.5 MG/3ML) 0.083% nebulizer solution Take 2.5 mg by nebulization every 6 (six) hours as needed.    Historical Provider, MD  albuterol (VENTOLIN HFA) 108 (90 BASE) MCG/ACT inhaler Inhale 2 puffs into the lungs every 4 (four) hours as needed for wheezing. 03/08/14   Mancel Bale,  PA-C  aspirin 325 MG tablet Take 325 mg by mouth daily.      Historical Provider, MD  cholecalciferol (VITAMIN D) 1000 UNITS tablet Take 1,000 Units by mouth once a week.    Historical Provider, MD  desvenlafaxine (PRISTIQ) 50 MG 24 hr tablet Take 1 tablet (50 mg total) by mouth daily. 08/14/14   Kathlee Nations, MD  diazepam (VALIUM) 5 MG tablet Take 5 mg by mouth as needed for anxiety.     Historical Provider, MD  fish oil-omega-3 fatty acids 1000 MG capsule Take 1 g by mouth daily.     Historical Provider, MD  glucosamine-chondroitin 500-400 MG tablet Take 1 tablet by mouth daily.     Historical Provider, MD  HYDROcodone-acetaminophen (NORCO/VICODIN) 5-325 MG per tablet Take 1 tablet by mouth 2 (two) times daily.    Historical Provider, MD  ipratropium (ATROVENT) 0.02 % nebulizer solution Take 500 mcg by nebulization as needed (thightness).  07/24/11 09/03/14  Brand Males, MD  montelukast (SINGULAIR) 10 MG tablet TAKE 1 TABLET AT BEDTIME 02/19/14   [name], MD  NORVASC 5 MG tablet TAKE 1 TABLET BY MOUTH EVERY DAY 08/22/14   [name], MD  oxybutynin (DITROPAN) 5 MG tablet Take 5 mg by mouth daily.    Historical Provider, MD  pantoprazole (PROTONIX) 40 MG tablet Take 1 tablet by mouth daily. 07/24/13   Historical Provider, MD  polyethylene glycol (MIRALAX / GLYCOLAX) packet Take 17 g by mouth daily as needed.    Historical Provider, MD  pregabalin (LYRICA) 75 MG capsule Take 100 mg by mouth daily.     Historical Provider, MD  PRESCRIPTION MEDICATION Chlorhexidine 0.12% Swish with 1/2 oz by mouth for 30 seconds then spit, bid    Historical Provider, MD  tiZANidine (ZANAFLEX) 4 MG tablet Take 2-4 mg by mouth every 6 (six) hours as needed for muscle spasms.    Historical Provider, MD  TOPROL XL 25 MG 24 hr tablet TAKE 1/2 TABLET BY MOUTH EVERY DAY 08/23/14   [name], MD  traZODone (DESYREL) 150 MG tablet Take 1 tablet (150 mg total) by mouth at bedtime. 08/14/14   Kathlee Nations, MD   vitamin B-12 (CYANOCOBALAMIN) 1000 MCG tablet Take 1,000 mcg by mouth daily.    Historical Provider, MD  vitamin C (ASCORBIC ACID) 500 MG tablet Take 500 mg by mouth daily.    Historical Provider, MD   BP 155/90 mmHg  Pulse 75  Temp(Src) 98 F (36.7 C) (Oral)  Resp 19  Ht 5\' 3"  (1.6 m)  Wt 196 lb (88.905 kg)  BMI 34.73 kg/m2  SpO2 86% Physical Exam  Constitutional: She appears well-developed and well-nourished. No distress.  HENT:  Head: Normocephalic.  Eyes: Conjunctivae are normal.  Neck: Normal range of motion. Neck supple.  Cardiovascular: Normal rate, regular rhythm and normal heart sounds.   Pulmonary/Chest: Effort normal and breath sounds normal. No respiratory distress. She has no wheezes. She has no  rales.  Decreased air movement bilaterally  Abdominal: Soft. Bowel sounds are normal. She exhibits no distension. There is no tenderness. There is no rebound.  Musculoskeletal: She exhibits no edema.  Neurological: She is alert.  Skin: Skin is warm and dry.  Psychiatric: She has a normal mood and affect. Her behavior is normal.  Nursing note and vitals reviewed.   ED Course  Procedures (including critical care time) Labs Review Labs Reviewed  [place], ED    Imaging Review No results found.   EKG Interpretation   Date/Time:  Wednesday September 19 2014 10:37:38 EST Ventricular Rate:  77 PR Interval:  185 QRS Duration: 94 QT Interval:  399 QTC Calculation: 452 R Axis:   55 Text Interpretation:  Sinus rhythm Atrial premature complex Borderline low  voltage, extremity leads Abnormal R-wave progression, early transition  Borderline repolarization abnormality Confirmed by ZACKOWSKI  MD, SCOTT  ([id number]) on 09/19/2014 10:41:01 AM      MDM   Final diagnoses:  SOB (shortness of breath)  Dizziness    Patient with dizziness, shortness of breath, symptoms come and go. Also palpitations. Patient  was sent here from primary care doctor's office, apparently there her heart rate went up to 180 and she was found to be in A. Fib, converted on her own. No history of A. Fib. Not anticoagulated. Sinus rhythm here. Labs unremarkable. I spoke with cardiology they will come down and see patient. VS normal.    4:15 PM Patient seen by cardiology, she was offered admission, she wanted to go home. She will be started on xarelto, continue Norvasc and Toprol, follow-up with cardiology.Patient is currently in sinus rhythm, vital signs are normal.  Filed Vitals:   09/19/14 1230 09/19/14 1300 09/19/14 1400 09/19/14 1430  BP: 137/77 136/83 108/56 121/42  Pulse: 62 66 69 62  Temp:      TempSrc:      Resp: 17 13 24 21   Height:      Weight:      SpO2: 86% 88% 88% 93%         Srishti Strnad A Briana Newman, PA-C 09/19/14 1700

## 2014-09-19 NOTE — ED Notes (Signed)
Admitting MD at bedside.

## 2014-09-19 NOTE — ED Notes (Signed)
To x-ray

## 2014-09-19 NOTE — Progress Notes (Signed)
Subjective:  This chart was scribed for Laurie Russian, MD by Ladene Artist, ED Scribe. The patient was seen in room 6. Patient's care was started at 9:16 AM.   Patient ID: Laurie Horton, female    DOB: Sep 08, 1946, 68 y.o.   MRN: [id number]  Chief Complaint  Patient presents with  . Palpitations  . Dizziness   HPI  HPI Comments: Laurie Horton is a 68 y.o. female, with a h/o COPD, HTN, DVT, fibromyalgia, who presents to the [facility] complaining of gradually worsening, intermittent dizziness since last appointment on 09/03/14. Pt reports associated palpitations and light-headedness noted yesterday while in the grocery store. She reports associated pain only with palpitations. Pt denies chest pain, abdominal pain, leg swelling. Next appointment with cardiologist Dr. Martinique on 10/30/14. Pt denies heart catheter. Pt reports family h/o cardiac related illness; pt's brother had MI at age 40. Pt reports that she quit smoking on 04/16/10.   Past Medical History  Diagnosis Date  . Depression   . GERD (gastroesophageal reflux disease)   . HTN (hypertension)   . Hiatal hernia   . COPD (chronic obstructive pulmonary disease)   . Diverticulitis   . DVT (deep venous thrombosis)   . Obesity   . Hyperlipidemia   . PFO (patent foramen ovale)   . PAT (paroxysmal atrial tachycardia)   . Right middle lobe pneumonia 07/24/2011    First noted at admit 07/10/11. Persists on cxr 07/22/11. Cleared on CT 08/24/11. No further followup  . PULMONARY NODULE, LEFT LOWER LOBE 10/14/2009    88mm LLL nodule dec 2010. Stable and 79mm in Oct 2012. No further fu  . TOBACCO ABUSE 06/04/2009  . Osteoporosis   . Fibromyalgia   . Stroke    Current Outpatient Prescriptions on File Prior to Visit  Medication Sig Dispense Refill  . albuterol (PROVENTIL) (2.5 MG/3ML) 0.083% nebulizer solution Take 2.5 mg by nebulization every 6 (six) hours as needed.    Marland Kitchen albuterol (VENTOLIN HFA) 108 (90 BASE) MCG/ACT  inhaler Inhale 2 puffs into the lungs every 4 (four) hours as needed for wheezing. 8.5 Inhaler 0  . aspirin 325 MG tablet Take 325 mg by mouth daily.      . cholecalciferol (VITAMIN D) 1000 UNITS tablet Take 1,000 Units by mouth once a week.    . desvenlafaxine (PRISTIQ) 50 MG 24 hr tablet Take 1 tablet (50 mg total) by mouth daily. 30 tablet 1  . diazepam (VALIUM) 5 MG tablet Take 5 mg by mouth as needed for anxiety.     . fish oil-omega-3 fatty acids 1000 MG capsule Take 1 g by mouth daily.     Marland Kitchen glucosamine-chondroitin 500-400 MG tablet Take 1 tablet by mouth daily.     Marland Kitchen HYDROcodone-acetaminophen (NORCO/VICODIN) 5-325 MG per tablet Take 1 tablet by mouth 2 (two) times daily.    Marland Kitchen ipratropium (ATROVENT) 0.02 % nebulizer solution Take 500 mcg by nebulization as needed (thightness).     . montelukast (SINGULAIR) 10 MG tablet TAKE 1 TABLET AT BEDTIME 30 tablet 1  . NORVASC 5 MG tablet TAKE 1 TABLET BY MOUTH EVERY DAY 30 tablet 4  . oxybutynin (DITROPAN) 5 MG tablet Take 5 mg by mouth daily.    . pantoprazole (PROTONIX) 40 MG tablet Take 1 tablet by mouth daily.    . polyethylene glycol (MIRALAX / GLYCOLAX) packet Take 17 g by mouth daily as needed.    . pregabalin (LYRICA) 75 MG capsule  Take 100 mg by mouth daily.     Marland Kitchen PRESCRIPTION MEDICATION Chlorhexidine 0.12% Swish with 1/2 oz by mouth for 30 seconds then spit, bid    . tiZANidine (ZANAFLEX) 4 MG tablet Take 2-4 mg by mouth every 6 (six) hours as needed for muscle spasms.    . TOPROL XL 25 MG 24 hr tablet TAKE 1/2 TABLET BY MOUTH EVERY DAY 15 tablet 4  . traZODone (DESYREL) 150 MG tablet Take 1 tablet (150 mg total) by mouth at bedtime. 30 tablet 1  . vitamin B-12 (CYANOCOBALAMIN) 1000 MCG tablet Take 1,000 mcg by mouth daily.    . vitamin C (ASCORBIC ACID) 500 MG tablet Take 500 mg by mouth daily.     No current facility-administered medications on file prior to visit.   Allergies  Allergen Reactions  . Alprazolam     REACTION: stops  breathing  . Budesonide-Formoterol Fumarate     bliusters  . Aciphex [Rabeprazole Sodium]     rash  . Ambien [Zolpidem]   . Avelox [Moxifloxacin Hcl In Nacl]   . Esomeprazole Magnesium     REACTION: "bouncing off walls"  . Eszopiclone   . Iodine     REACTION: swelling in throat  . Loratadine     claritin D causes shaking  . Lotrimin [Clotrimazole]   . Lunesta [Eszopiclone]     REACTION: "slept for a week"  . Oxcarbazepine Other (See Comments)    Causes deep sleep and dizziness  . Pseudoephedrine Hcl Er   . Zolpidem Tartrate     REACTION: "slept for a week"  . Bextra [Valdecoxib] Rash  . Ceclor [Cefaclor] Rash  . Covera-Hs [Verapamil Hcl] Palpitations  . Dicyclomine Hcl Rash  . Tessalon Perles Rash   Review of Systems  Cardiovascular: Positive for palpitations. Negative for chest pain and leg swelling.  Gastrointestinal: Negative for abdominal pain.  Neurological: Positive for dizziness and light-headedness. Negative for syncope.      Objective:   Physical Exam CONSTITUTIONAL: Well developed/well nourished HEAD: Normocephalic/atraumatic EYES: EOMI/PERRL ENMT: Mucous membranes moist NECK: supple no meningeal signs SPINE/BACK:entire spine nontender CV: frequent skip beats, no murmurs are heard  LUNGS: Decreased breath sounds in the bases ABDOMEN: soft, nontender, no rebound or guarding, bowel sounds noted throughout abdomen GU:no cva tenderness NEURO: Pt is awake/alert/cooperative, appropriate, moves all extremitiesx4.  No facial droop.   EXTREMITIES: pulses normal/equal, full ROM SKIN: warm, color normal PSYCH: no abnormalities of mood noted, alert and oriented to situation  EKG: Shows diffuse anteroseptal T changes with new development of inferior Q waves, not present on tracing 3 weeks ago.     Assessment & Plan:  Will check stat EKG. Pt placed on monitor. She has an abnormal baseline EKG with worsening palpitations and dizziness. Will transport via EMS for  evaluation. Call placed to Dr. Martinique. Pt had a normal stress test and ejection fraction 1.5 year ago. She has not smoked in the last 4 years. I went back in to discuss situation with pt and while in the room the paramedics arrived and pt developed atrial fibrillation with rapid ventricular response. This spontaneously converted after approximately 30 seconds. The rate was 180 when it started. Pt very high risk for coronary disease and has possibly had an inferior event in the last 2 weeks.   I personally performed the services described in this documentation, which was scribed in my presence. The recorded information has been reviewed and is accurate.

## 2014-09-19 NOTE — ED Notes (Signed)
Per EMS, pt has intermittent dizziness x 2 weeks. Pt reports that she was grocery shopping 2 days ago and became dizzy and had to sit down. Pt went to urgent care today and was having intermittent runs of A-fib so she was transferred here. Pt also reports SOB related to COPD. Pt took 325 of aspirin this morning. NAD at this time.

## 2014-09-19 NOTE — Discharge Instructions (Signed)
Take xarelto as prescribed daily with meals. Follow up with your cardiologist. Return if worsening symptoms.   Atrial Fibrillation Atrial fibrillation is a type of irregular heart rhythm (arrhythmia). During atrial fibrillation, the upper chambers of the heart (atria) quiver continuously in a chaotic pattern. This causes an irregular and often rapid heart rate.  Atrial fibrillation is the result of the heart becoming overloaded with disorganized signals that tell it to beat. These signals are normally released one at a time by a part of the right atrium called the sinoatrial node. They then travel from the atria to the lower chambers of the heart (ventricles), causing the atria and ventricles to contract and pump blood as they pass. In atrial fibrillation, parts of the atria outside of the sinoatrial node also release these signals. This results in two problems. First, the atria receive so many signals that they do not have time to fully contract. Second, the ventricles, which can only receive one signal at a time, beat irregularly and out of rhythm with the atria.  There are three types of atrial fibrillation:   Paroxysmal. Paroxysmal atrial fibrillation starts suddenly and stops on its own within a week.  Persistent. Persistent atrial fibrillation lasts for more than a week. It may stop on its own or with treatment.  Permanent. Permanent atrial fibrillation does not go away. Episodes of atrial fibrillation may lead to permanent atrial fibrillation. Atrial fibrillation can prevent your heart from pumping blood normally. It increases your risk of stroke and can lead to heart failure.  CAUSES   Heart conditions, including a heart attack, heart failure, coronary artery disease, and heart valve conditions.   Inflammation of the sac that surrounds the heart (pericarditis).  Blockage of an artery in the lungs (pulmonary embolism).  Pneumonia or other infections.  Chronic lung disease.  Thyroid  problems, especially if the thyroid is overactive (hyperthyroidism).  Caffeine, excessive alcohol use, and use of some illegal drugs.   Use of some medicines, including certain decongestants and diet pills.  Heart surgery.   Birth defects.  Sometimes, no cause can be found. When this happens, the atrial fibrillation is called lone atrial fibrillation. The risk of complications from atrial fibrillation increases if you have lone atrial fibrillation and you are age 23 years or older. RISK FACTORS  Heart failure.  Coronary artery disease.  Diabetes mellitus.   High blood pressure (hypertension).   Obesity.   Other arrhythmias.   Increased age. SIGNS AND SYMPTOMS   A feeling that your heart is beating rapidly or irregularly.   A feeling of discomfort or pain in your chest.   Shortness of breath.   Sudden light-headedness or weakness.   Getting tired easily when exercising.   Urinating more often than normal (mainly when atrial fibrillation first begins).  In paroxysmal atrial fibrillation, symptoms may start and suddenly stop. DIAGNOSIS  Your health care provider may be able to detect atrial fibrillation when taking your pulse. Your health care provider may have you take a test called an ambulatory electrocardiogram (ECG). An ECG records your heartbeat patterns over a 24-hour period. You may also have other tests, such as:  Transthoracic echocardiogram (TTE). During echocardiography, sound waves are used to evaluate how blood flows through your heart.  Transesophageal echocardiogram (TEE).  Stress test. There is more than one type of stress test. If a stress test is needed, ask your health care provider about which type is best for you.  Chest X-ray exam.  Blood tests.  Computed tomography (CT). TREATMENT  Treatment may include:  Treating any underlying conditions. For example, if you have an overactive thyroid, treating the condition may correct  atrial fibrillation.  Taking medicine. Medicines may be given to control a rapid heart rate or to prevent blood clots, heart failure, or a stroke.  Having a procedure to correct the rhythm of the heart:  Electrical cardioversion. During electrical cardioversion, a controlled, low-energy shock is delivered to the heart through your skin. If you have chest pain, very low blood pressure, or sudden heart failure, this procedure may need to be done as an emergency.  Catheter ablation. During this procedure, heart tissues that send the signals that cause atrial fibrillation are destroyed.  Surgical ablation. During this surgery, thin lines of heart tissue that carry the abnormal signals are destroyed. This procedure can either be an open-heart surgery or a minimally invasive surgery. With the minimally invasive surgery, small cuts are made to access the heart instead of a large opening.  Pulmonary venous isolation. During this surgery, tissue around the veins that carry blood from the lungs (pulmonary veins) is destroyed. This tissue is thought to carry the abnormal signals. HOME CARE INSTRUCTIONS   Take medicines only as directed by your health care provider. Some medicines can make atrial fibrillation worse or recur.  If blood thinners were prescribed by your health care provider, take them exactly as directed. Too much blood-thinning medicine can cause bleeding. If you take too little, you will not have the needed protection against stroke and other problems.  Perform blood tests at home if directed by your health care provider. Perform blood tests exactly as directed.  Quit smoking if you smoke.  Do not drink alcohol.  Do not drink caffeinated beverages such as coffee, soda, and some teas. You may drink decaffeinated coffee, soda, or tea.   Maintain a healthy weight.Do not use diet pills unless your health care provider approves. They may make heart problems worse.   Follow diet  instructions as directed by your health care provider.  Exercise regularly as directed by your health care provider.  Keep all follow-up visits as directed by your health care provider. This is important. PREVENTION  The following substances can cause atrial fibrillation to recur:   Caffeinated beverages.  Alcohol.  Certain medicines, especially those used for breathing problems.  Certain herbs and herbal medicines, such as those containing ephedra or ginseng.  Illegal drugs, such as cocaine and amphetamines. Sometimes medicines are given to prevent atrial fibrillation from recurring. Proper treatment of any underlying condition is also important in helping prevent recurrence.  SEEK MEDICAL CARE IF:  You notice a change in the rate, rhythm, or strength of your heartbeat.  You suddenly begin urinating more frequently.  You tire more easily when exerting yourself or exercising. SEEK IMMEDIATE MEDICAL CARE IF:   You have chest pain, abdominal pain, sweating, or weakness.  You feel nauseous.  You have shortness of breath.  You suddenly have swollen feet and ankles.  You feel dizzy.  Your face or limbs feel numb or weak.  You have a change in your vision or speech. MAKE SURE YOU:   Understand these instructions.  Will watch your condition.  Will get help right away if you are not doing well or get worse. Document Released: 10/19/2005 Document Revised: 03/05/2014 Document Reviewed: 11/29/2012 [facility] Patient Information 2015 [place], [place]. This information is not intended to replace advice given to you by your health care provider. Make sure  you discuss any questions you have with your health care provider.   Information on my medicine - XARELTO (Rivaroxaban)  This medication education was reviewed with me or my healthcare representative as part of my discharge preparation.  The pharmacist that spoke with me during my hospital stay was:  Horatio Pel, RPH  Why  was Xarelto prescribed for you? Xarelto was prescribed for you to reduce the risk of a blood clot forming that can cause a stroke if you have a medical condition called atrial fibrillation (a type of irregular heartbeat).  What do you need to know about xarelto ? Take your Xarelto ONCE DAILY at the same time every day with your evening meal. If you have difficulty swallowing the tablet whole, you may crush it and mix in applesauce just prior to taking your dose.  Take Xarelto exactly as prescribed by your doctor and DO NOT stop taking Xarelto without talking to the doctor who prescribed the medication.  Stopping without other stroke prevention medication to take the place of Xarelto may increase your risk of developing a clot that causes a stroke.  Refill your prescription before you run out.  After discharge, you should have regular check-up appointments with your healthcare provider that is prescribing your Xarelto.  In the future your dose may need to be changed if your kidney function or weight changes by a significant amount.  What do you do if you miss a dose? If you are taking Xarelto ONCE DAILY and you miss a dose, take it as soon as you remember on the same day then continue your regularly scheduled once daily regimen the next day. Do not take two doses of Xarelto at the same time or on the same day.   Important Safety Information A possible side effect of Xarelto is bleeding. You should call your healthcare provider right away if you experience any of the following: ? Bleeding from an injury or your nose that does not stop. ? Unusual colored urine (red or dark brown) or unusual colored stools (red or black). ? Unusual bruising for unknown reasons. ? A serious fall or if you hit your head (even if there is no bleeding).  Some medicines may interact with Xarelto and might increase your risk of bleeding while on Xarelto. To help avoid this, consult your healthcare provider or  pharmacist prior to using any new prescription or non-prescription medications, including herbals, vitamins, non-steroidal anti-inflammatory drugs (NSAIDs) and supplements.  This website has more information on Xarelto: [email or web address]

## 2014-09-19 NOTE — ED Notes (Signed)
Up to br gait steady

## 2014-09-19 NOTE — ED Notes (Signed)
Returned from xray

## 2014-09-19 NOTE — ED Provider Notes (Signed)
Medical screening examination/treatment/procedure(s) were conducted as a shared visit with non-physician practitioner(s) and myself.  I personally evaluated the patient during the encounter.   EKG Interpretation   Date/Time:  Wednesday September 19 2014 10:37:38 EST Ventricular Rate:  77 PR Interval:  185 QRS Duration: 94 QT Interval:  399 QTC Calculation: 452 R Axis:   55 Text Interpretation:  Sinus rhythm Atrial premature complex Borderline low  voltage, extremity leads Abnormal R-wave progression, early transition  Borderline repolarization abnormality Confirmed by Gurvir Schrom  MD, Janautica Netzley  [phone]) on 09/19/2014 10:41:01 AM       Results for orders placed or performed during the hospital encounter of 09/19/14  CBC  Result Value Ref Range   WBC 7.1 4.0 - 10.5 K/uL   RBC 4.44 3.87 - 5.11 MIL/uL   Hemoglobin 13.7 12.0 - 15.0 g/dL   HCT 41.3 36.0 - 46.0 %   MCV 93.0 78.0 - 100.0 fL   MCH 30.9 26.0 - 34.0 pg   MCHC 33.2 30.0 - 36.0 g/dL   RDW 12.9 11.5 - 15.5 %   Platelets 214 150 - 400 K/uL  Basic metabolic panel  Result Value Ref Range   Sodium 139 137 - 147 mEq/L   Potassium 4.2 3.7 - 5.3 mEq/L   Chloride 98 96 - 112 mEq/L   CO2 28 19 - 32 mEq/L   Glucose, Bld 92 70 - 99 mg/dL   BUN 15 6 - 23 mg/dL   Creatinine, Ser 0.81 0.50 - 1.10 mg/dL   Calcium 9.6 8.4 - 10.5 mg/dL   GFR calc non Af Amer 73 (L) >90 mL/min   GFR calc Af Amer 85 (L) >90 mL/min   Anion gap 13 5 - 15  Pro b natriuretic peptide  Result Value Ref Range   Pro B Natriuretic peptide (BNP) 129.0 (H) 0 - 125 pg/mL  I-stat troponin, ED (not at [facility])  Result Value Ref Range   Troponin i, poc 0.00 0.00 - 0.08 ng/mL   Comment 3           Dg Chest 2 View  09/19/2014   CLINICAL DATA:  68 year old female with shortness of breath and dizziness. Initial encounter.  EXAM: CHEST  2 VIEW  COMPARISON:  03/08/2014 and earlier.  FINDINGS: Stable cardiomegaly and mediastinal contours. Stable tortuosity of the descending  thoracic aorta. Other mediastinal contours are within normal limits. Visualized tracheal air column is within normal limits. Chronic increased interstitial markings. Stable lung volumes. No pneumothorax, pulmonary edema, pleural effusion or confluent pulmonary opacity. Stable cholecystectomy clips. Calcified atherosclerosis of the abdominal aorta. No acute osseous abnormality identified.  IMPRESSION: No acute cardiopulmonary abnormality.   Electronically Signed   By: Lars Pinks M.D.   On: 09/19/2014 11:43    Patient with episodes of rapid heart rate probably atrial fib in the doctor's office. And had an episode here. Patient is been followed by cardiology in the past. Consult for admission provided to them. They will be coming down to see the patient. Patient currently asymptomatic but she's had episodes of intermittent dizziness over the past 2 weeks. Patient has a history of fairly significant COPD. No significant chest pain. Patient has had aspirin today. No acute distress currently. Heart currently regular rate without murmur. Patient is on cardiac monitor. Patient is normally on home oxygen for her COPD. She is on that. Chest x-ray without any acute findings. Patient's initial troponin here was negative.  Fredia Sorrow, MD 09/19/14 1300

## 2014-09-20 ENCOUNTER — Telehealth: Payer: Self-pay | Admitting: Cardiology

## 2014-09-20 NOTE — Telephone Encounter (Signed)
Returned call to patient Dr.Jordan advised ok to take coumadin instead of xarelto.Schedulers will call back to schedule appointment with coumadin clinic.

## 2014-09-20 NOTE — Telephone Encounter (Signed)
Pt called in stating that she was just prescribed Xarelto which she has a one time 30 day supply of. She would like to know if there is something less expensive that she could take in the place of this. Please call  thanks

## 2014-09-20 NOTE — Telephone Encounter (Signed)
OK to take coumadin instead of Xarelto. Will need coumadin clinic to manage.  Peter Martinique MD, [facility]

## 2014-09-20 NOTE — Telephone Encounter (Signed)
Returned call to patient she stated she can not afford xarelto.Stated she wanted to go back on coumadin.Stated she made appointment with Dr.Jordan 10/30/14 at 10:00 am.Message sent to [place] for coumadin order.

## 2014-09-26 ENCOUNTER — Ambulatory Visit (INDEPENDENT_AMBULATORY_CARE_PROVIDER_SITE_OTHER): Payer: Medicare Other | Admitting: Pharmacist Clinician (PhC)/ Clinical Pharmacy Specialist

## 2014-09-26 DIAGNOSIS — Z7901 Long term (current) use of anticoagulants: Secondary | ICD-10-CM

## 2014-09-26 DIAGNOSIS — I480 Paroxysmal atrial fibrillation: Secondary | ICD-10-CM

## 2014-10-01 ENCOUNTER — Ambulatory Visit (INDEPENDENT_AMBULATORY_CARE_PROVIDER_SITE_OTHER): Payer: Medicare Other | Admitting: Adult Health

## 2014-10-01 ENCOUNTER — Encounter: Payer: Self-pay | Admitting: Adult Health

## 2014-10-01 VITALS — BP 116/76 | HR 66 | Temp 97.0°F | Ht 63.0 in | Wt 200.2 lb

## 2014-10-01 DIAGNOSIS — Z23 Encounter for immunization: Secondary | ICD-10-CM

## 2014-10-01 DIAGNOSIS — J9611 Chronic respiratory failure with hypoxia: Secondary | ICD-10-CM

## 2014-10-01 DIAGNOSIS — J438 Other emphysema: Secondary | ICD-10-CM

## 2014-10-01 MED ORDER — WARFARIN SODIUM 5 MG PO TABS: 5.0000 mg | ORAL_TABLET | Freq: Every day | ORAL | Status: DC

## 2014-10-01 NOTE — Addendum Note (Signed)
Addended by: Virl Cagey on: 10/01/2014 09:59 AM   Modules accepted: Orders

## 2014-10-01 NOTE — Patient Instructions (Addendum)
Prevnar vaccine today  Continue on current regimen  Follow up Dr. [name] in 4 months and As needed

## 2014-10-01 NOTE — Assessment & Plan Note (Signed)
Compensated without flare  Prevnar vaccine today

## 2014-10-01 NOTE — Progress Notes (Signed)
Subjective:    Patient ID: Laurie Horton, female    DOB: 1946/03/17, 68 y.o.   MRN: [id number]  HPI 1. Smoker -  quit August 2011  reports that she quit smoking about 2 years ago. She does not have any smokeless tobacco history on file.   2. COPD   - with small PFO, exertional desat as of Sept 2011   - isolated low dlco on PFT Oct 2012 with emphysema on CT chest:  PFT 08/28/11 - shows isolated low dlco only 43%, Fev1 2.111/100%, ratio 60. - using oxygen 2013 following years of refusal  3. LLL pulmonary micronodule 0.6cm with subcentimeter mediastinal nodes  - dec 2010, aug 2011, oct 2012 - no further followup  4. Admissions for Resp failure/AECOPD   - Post p-lap for gall bladder - complicated by ICU resp failure and pneumonia LLL - March 2011, resolved on CT aug 2011 - RML PNA Sept 2012; resolved on CT oct 2012  5. Recurrent AECOPD  - aug/sept 2012 - Dr Everlene Farrier opd Rx - oct 2012 (office rx)  6. Poor health literacy, poly pharmacy, lives alone, pscyh issues  7. Code Status:  --DNAR, DNAI including no short term ventilation  - Oct 2012 pulm office visit. Thinks she will be dead by 07-Sep-2012 - changed to full code - July 2014 but " do not want to be a vegetable"    OV 08/01/2012  COPD - mild yellow sputum, increase cough and increased wheeze past  2 weeks. No change in dyspnea or effort tolerance. CAT score 20. Vaccine - pneumovax 2007 prior to age 44. FLu - not had yet. Reluctantly agreed to have both. She is worried she had 'flu' afeter flu shot in 2012-2013 season but I explained advantages and limitations of flu shot and she agreed.   Smoking - still in remission  #COPD You have mild attack of copd called COPD exacerbation Please take biaxin per your request No need for prednisone burst at this point. However, if this gets worse you might need prednisone but right now you do not Have flu shot and pneumovax vaccine 08/01/2012 today Continue your medications and  oxygen  #Smoking  - glad you are still quit  #Followup  9 months or sooner if needed  OV 05/02/2013  Routine COPD followup. Feels baseline. Compliant with nebs and oxygen. Smoking in remission. COPD cat score down to 15  New problem: wants pre-op resp clearance for some kind of spinalprocedure. DEscribing L2-L5 radiculopathy and says Dr Maia Petties is going to "burn nerve roots" because she is refractory or only has temp re;lief with epidural shots. Wants permanent relief. PCP DAUB, STEVE A, MD has cuationed against surgerygiven prior experience with post op resp failure. I did speak to Dr  Hope Pigeon. Saullo ahd he described 30 min RFA procedure on back with lidocaine +/- moderate sedation as tye typical procedure. She has had problems with moderate sedation before.  Preop risk assessment scores without use of sedation shows that her risk for pulmonary complications for this procedure is LOW    #COPD  - stable disease  - continue oxygen and nebs  - next visit do full PFT breathing test  #Preoperative pulmonray evaluation  - will talk to Dr Maia Petties; my nurse will reach his office now [phone]  - Will call you after I talk to him  #Followup  - 3 months with full PFT     OV 08/14/2013  Followup COPD. She says she  is stable. However, Score is 23.5 and appears worse than baseline. She says she is compliant with her ipratropium nebulizer and oxygen. He may function test today 08/14/2013 shows normal spirometry with FEV1 of 1.8 L/82%. FVC of 2.9 L 100%. Total lung capacity of 4.3 L/80%. She only has isolated reduction in diffusion capacity of 11.6/50%. She will have a flu shot today. She says last year she had a flu shot recommended by being and then she was in bed for 2 days but she denies any allergy to reaction and she is consented for a flu shot this time   OV 06/27/2014 - ACUTE VISIT  Chief Complaint  Patient presents with  . Acute Visit    Pt states her fibro myalgia is bothering her  chest wall. Pt states she has at times had to increase her O2 due to the heat. Pt c/o prod cough with cream colored mucous.Pt states when she takes a deep breath is hurts.    NOt seen her in 10 months. Acute visit now. Several months fibromylagia worse. Several weeks fibromyalgia in chest wall. Associated with this increased dyspnea to a moderate severity. She not sure if cough is worse but sputum is slightly different color x 2 weeks than baseline. Spurum volume might be same but she is not sure. Denies wheezing, fever, chills. USes nebsd and o2    CAT COPD Symptom & Quality of Life Score ([employer] trademark) 0 is no burden. 5 is highest burden 08/01/2012 She is in aecopd 05/02/2013  06/27/2014   Never Cough -> Cough all the time 3 2 3   No phlegm in chest -> Chest is full of phlegm 3 0 4  No chest tightness -> Chest feels very tight 3 0 5, due to fibromyalgia per hx  No dyspnea for 1 flight stairs/hill -> Very dyspneic for 1 flight of stairs 2 5 3   No limitations for ADL at home -> Very limited with ADL at home 1 3 2   Confident leaving home -> Not at all confident leaving home 1 0 0  Sleep soundly -> Do not sleep soundly because of lung condition 3 0 3  Lots of Energy -> No energy at all 4 5 4   TOTAL Score (max 40)  20 15 24     10/01/2014 Follow up COPD Returns for follow up for COPD.  On O2 2l/m . Uses Albuterol and Atrovent nebs As needed  Only.  CXR on 09/19/14 w/ chronic changes.  Was seen in ER for Atrial Fib w/ RVR , seen by cardiology .  Is doing better w/ controlled HR. On Xarelto. (plans to convert to coumadin next month) .     Review of Systems  Constitutional: Negative for fever and unexpected weight change.  HENT: Negative for congestion, dental problem, ear pain, nosebleeds, postnasal drip, rhinorrhea, sinus pressure, sneezing, sore throat and trouble swallowing.   Eyes: Negative for redness and itching.  Respiratory: Positive for shortness of breath. Negative for chest  tightness and wheezing.   Cardiovascular:  Negative for palpitations and leg swelling.  Gastrointestinal: Negative for nausea and vomiting.  Genitourinary: Negative for dysuria.  Musculoskeletal: Negative for joint swelling.  Skin: Negative for rash.  Neurological: Negative for headaches.  Hematological: Does not bruise/bleed easily.  Psychiatric/Behavioral: Negative for dysphoric mood. The patient is not nervous/anxious.        Objective:   Physical Exam  GEN: A/Ox3; pleasant , NAD, elderly and chronically ill appearing   HEENT:  [place]/AT,  EACs-clear,  TMs-wnl, NOSE-clear, THROAT-clear, no lesions, no postnasal drip or exudate noted.   NECK:  Supple w/ fair ROM; no JVD; normal carotid impulses w/o bruits; no thyromegaly or nodules palpated; no lymphadenopathy.  RESP  Decreased BS in bases, no wheezing no accessory muscle use, no dullness to percussion  CARD:  RRR, no m/r/g  , no peripheral edema, pulses intact, no cyanosis or clubbing.  GI:   Soft & nt; nml bowel sounds; no organomegaly or masses detected.  Musco: Warm bil, no deformities or joint swelling noted.   Neuro: alert, no focal deficits noted.    Skin: Warm, no lesions or rashes     Assessment & Plan:

## 2014-10-01 NOTE — Assessment & Plan Note (Signed)
Continue on current regimen .   

## 2014-10-10 ENCOUNTER — Other Ambulatory Visit: Payer: Self-pay | Admitting: Emergency Medicine

## 2014-10-10 NOTE — Telephone Encounter (Signed)
Dr Everlene Farrier, you have seen pt recently, but I don't see this med discussed recently, and wasn't sure what it is being Rxd for, COPD? I don't see asthma or AR on problem list but assume you are trying to control one of these in order to prevent COPD exacerbations? Do you want to give RFs?

## 2014-10-12 ENCOUNTER — Telehealth: Payer: Self-pay | Admitting: Cardiology

## 2014-10-12 NOTE — Telephone Encounter (Signed)
Pt called in wanting to know if she can be seen by  Dr.Jordan sooner than 12/29 because her Afib is really starting to give her problems. She was hospitalized by in 11/18 because of it. Please call

## 2014-10-12 NOTE — Telephone Encounter (Signed)
Returned call to patient she stated needed to be seen before 10/30/14.Stated she is having episodes of atrial fib.Stated B/P elevated 154/104.Stated when she has atrial fib she gets sob,dizzy,feels faint,Appointment scheduled with Truitt Merle NP 10/15/14 at 11:00 am at [facility] office.

## 2014-10-12 NOTE — Telephone Encounter (Signed)
Deferred to [facility]

## 2014-10-15 ENCOUNTER — Ambulatory Visit (INDEPENDENT_AMBULATORY_CARE_PROVIDER_SITE_OTHER): Payer: Medicare Other | Admitting: Psychiatry

## 2014-10-15 ENCOUNTER — Encounter: Payer: Self-pay | Admitting: Nurse Practitioner

## 2014-10-15 ENCOUNTER — Encounter (HOSPITAL_COMMUNITY): Payer: Self-pay | Admitting: Psychiatry

## 2014-10-15 ENCOUNTER — Encounter (INDEPENDENT_AMBULATORY_CARE_PROVIDER_SITE_OTHER): Payer: Medicare Other

## 2014-10-15 ENCOUNTER — Encounter: Payer: Self-pay | Admitting: *Deleted

## 2014-10-15 ENCOUNTER — Ambulatory Visit (INDEPENDENT_AMBULATORY_CARE_PROVIDER_SITE_OTHER): Payer: Medicare Other | Admitting: Nurse Practitioner

## 2014-10-15 VITALS — BP 160/100 | HR 77 | Ht 63.0 in | Wt 196.1 lb

## 2014-10-15 DIAGNOSIS — F39 Unspecified mood [affective] disorder: Secondary | ICD-10-CM

## 2014-10-15 DIAGNOSIS — F4310 Post-traumatic stress disorder, unspecified: Secondary | ICD-10-CM

## 2014-10-15 DIAGNOSIS — R9431 Abnormal electrocardiogram [ECG] [EKG]: Secondary | ICD-10-CM

## 2014-10-15 DIAGNOSIS — I480 Paroxysmal atrial fibrillation: Secondary | ICD-10-CM

## 2014-10-15 DIAGNOSIS — Z79899 Other long term (current) drug therapy: Secondary | ICD-10-CM

## 2014-10-15 DIAGNOSIS — F0630 Mood disorder due to known physiological condition, unspecified: Secondary | ICD-10-CM

## 2014-10-15 LAB — CBC
HCT: 43.1 % (ref 36.0–46.0)
Hemoglobin: 14.2 g/dL (ref 12.0–15.0)
MCHC: 33 g/dL (ref 30.0–36.0)
MCV: 95.7 fl (ref 78.0–100.0)
Platelets: 226 10*3/uL (ref 150.0–400.0)
RBC: 4.5 Mil/uL (ref 3.87–5.11)
RDW: 13.4 % (ref 11.5–15.5)
WBC: 8 10*3/uL (ref 4.0–10.5)

## 2014-10-15 LAB — BASIC METABOLIC PANEL
BUN: 16 mg/dL (ref 6–23)
CO2: 31 mEq/L (ref 19–32)
Calcium: 10 mg/dL (ref 8.4–10.5)
Chloride: 99 mEq/L (ref 96–112)
Creatinine, Ser: 0.9 mg/dL (ref 0.4–1.2)
GFR: 66.01 mL/min (ref >60.00)
Glucose, Bld: 85 mg/dL (ref 70–99)
Potassium: 3.7 mEq/L (ref 3.5–5.1)
Sodium: 137 mEq/L (ref 135–145)

## 2014-10-15 LAB — TSH: TSH: 2.82 u[IU]/mL (ref 0.35–4.50)

## 2014-10-15 MED ORDER — TRAZODONE HCL 100 MG PO TABS: 100.0000 mg | ORAL_TABLET | Freq: Every day | ORAL | Status: DC

## 2014-10-15 MED ORDER — DESVENLAFAXINE SUCCINATE ER 50 MG PO TB24: 50.0000 mg | ORAL_TABLET | Freq: Every day | ORAL | Status: DC

## 2014-10-15 MED ORDER — METOPROLOL SUCCINATE ER 25 MG PO TB24: 25.0000 mg | ORAL_TABLET | Freq: Every day | ORAL | Status: DC

## 2014-10-15 MED ORDER — DIAZEPAM 2 MG PO TABS: 2.0000 mg | ORAL_TABLET | ORAL | Status: DC | PRN

## 2014-10-15 NOTE — Progress Notes (Signed)
Laurie Horton Date of Birth: 04/15/1946 Medical Record #[id number]  History of Present Illness: Laurie Horton is seen for a work in visit today. Seen for Dr. Martinique. She is a 68 year old female with abnormal ECG, a history of severe COPD, stroke after knee surgery in 2006. Evaluation at that time revealed a PFO by TEE. She has been maintained on aspirin therapy. The only prior ischemic evaluation she had included normal nuclear stress test in 2002 and 2006. She has no symptoms of angina. She does have chronic shortness of breath. Recently she was seen by Dr. Everlene Farrier. ECG revealed increased T-wave inversion in the anterior precordial leads. She apparently is going to require lumbar surgery in the near future.   She has not been seen since July of 2014 - that was for her abnormal EKG. Lexiscan was updated at that time.  Called Dr. Martinique on Friday -  Returned call to patient she stated needed to be seen before 10/30/14.Stated she is having episodes of atrial fib.Stated B/P elevated 154/104.Stated when she has atrial fib she gets sob,dizzy,feels faint,Appointment scheduled with Truitt Merle NP 10/15/14 at 11:00 am at [facility] office.     She was in the ER almost one month ago - was in AF with RVR - converted. Was to have had an event monitor and was to increase her Toprol. She was put on anticoagulation - she is not able to afford and will be switching over to coumadin later this week.   Comes in today. Here alone. Says she is feeling bad. Having more and more AF - says she has it today - EKG with NSR with PACs. She is not really any more short of breath. She is lightheaded and dizzy. Gets nauseated and swelling with a "shocking" sensation that she feels. BP tracking up. She has not had her monitor placed, nor increased her Toprol - says she was not told. Pretty limited by back pain - upset that she will "never" be able to have an injection.   Current Outpatient Prescriptions  Medication Sig  Dispense Refill  . albuterol (PROVENTIL) (2.5 MG/3ML) 0.083% nebulizer solution Take 2.5 mg by nebulization every 6 (six) hours as needed for wheezing or shortness of breath.     Marland Kitchen albuterol (VENTOLIN HFA) 108 (90 BASE) MCG/ACT inhaler Inhale 2 puffs into the lungs every 4 (four) hours as needed for wheezing. (Patient taking differently: Inhale 2 puffs into the lungs every 4 (four) hours as needed for wheezing (rescue inhaler). ) 8.5 Inhaler 0  . desvenlafaxine (PRISTIQ) 50 MG 24 hr tablet Take 1 tablet (50 mg total) by mouth daily. 30 tablet 1  . diazepam (VALIUM) 2 MG tablet Take 1 tablet (2 mg total) by mouth as needed for anxiety. 30 tablet 0  . HYDROcodone-acetaminophen (NORCO/VICODIN) 5-325 MG per tablet Take 1 tablet by mouth 2 (two) times daily.    Marland Kitchen ipratropium (ATROVENT) 0.02 % nebulizer solution Take 500 mcg by nebulization every 4 (four) hours as needed (thightness).     . metoprolol succinate (TOPROL XL) 25 MG 24 hr tablet Take 1 tablet (25 mg total) by mouth daily. 30 tablet 4  . montelukast (SINGULAIR) 10 MG tablet TAKE 1 TABLET AT BEDTIME 30 tablet 4  . NORVASC 5 MG tablet TAKE 1 TABLET BY MOUTH EVERY DAY 30 tablet 4  . oxybutynin (DITROPAN) 5 MG tablet Take 5 mg by mouth daily.    . pantoprazole (PROTONIX) 40 MG tablet Take 1 tablet by  mouth daily.    . polyethylene glycol (MIRALAX / GLYCOLAX) packet Take 17 g by mouth daily as needed.     . pregabalin (LYRICA) 50 MG capsule take 2 capsules by oral route daily    . rivaroxaban (XARELTO) 20 MG TABS tablet Take 1 tablet (20 mg total) by mouth daily with supper. 30 tablet 0  . tiZANidine (ZANAFLEX) 4 MG tablet Take 1 mg by mouth as needed for muscle spasms.     . traZODone (DESYREL) 100 MG tablet Take 1 tablet (100 mg total) by mouth at bedtime. 30 tablet 1  . warfarin (COUMADIN) 5 MG tablet Take 1 tablet (5 mg total) by mouth daily. 30 tablet 2   No current facility-administered medications for this visit.    Allergies  Allergen  Reactions  . Alprazolam     REACTION: stops breathing  . Budesonide-Formoterol Fumarate     bliusters  . Aciphex [Rabeprazole Sodium]     rash  . Ambien [Zolpidem]   . Avelox [Moxifloxacin Hcl In Nacl]   . Esomeprazole Magnesium     REACTION: "bouncing off walls"  . Eszopiclone   . Iodine     REACTION: swelling in throat  . Loratadine     claritin D causes shaking  . Lotrimin [Clotrimazole]   . Lunesta [Eszopiclone]     REACTION: "slept for a week"  . Oxcarbazepine Other (See Comments)    Causes deep sleep and dizziness  . Pseudoephedrine Hcl Er   . Zolpidem Tartrate     REACTION: "slept for a week"  . Bextra [Valdecoxib] Rash  . Ceclor [Cefaclor] Rash  . Covera-Hs [Verapamil Hcl] Palpitations  . Dicyclomine Hcl Rash  . Tessalon Perles Rash    Past Medical History  Diagnosis Date  . Depression   . GERD (gastroesophageal reflux disease)   . HTN (hypertension)   . Hiatal hernia   . COPD (chronic obstructive pulmonary disease)   . Diverticulitis   . DVT (deep venous thrombosis)   . Obesity   . Hyperlipidemia   . PFO (patent foramen ovale)   . PAT (paroxysmal atrial tachycardia)   . Right middle lobe pneumonia 07/24/2011    First noted at admit 07/10/11. Persists on cxr 07/22/11. Cleared on CT 08/24/11. No further followup  . PULMONARY NODULE, LEFT LOWER LOBE 10/14/2009    46mm LLL nodule dec 2010. Stable and 12mm in Oct 2012. No further fu  . TOBACCO ABUSE 06/04/2009  . Osteoporosis   . Fibromyalgia   . Stroke     Past Surgical History  Procedure Laterality Date  . Total abdominal hysterectomy      post op needed oxygen was told "she gave them a scare"  . Knee arthroscopy  2000    left  . Laparoscopic cholecystectomy  04-16-2010    [facility]  . Tubal ligation    . US echocardiography  11/20/2009    EF 55-60%  . Cardiovascular stress test  12/26/2004    EF 74%. NO EVIDENCE OF ISCHEMIA    History  Smoking status  . Former Smoker  . Quit date: 06/02/2010    Smokeless tobacco  . Not on file    History  Alcohol Use No    Family History  Problem Relation Age of Onset  . Dementia Mother   . Diabetes Mother   . Alzheimer's disease Mother   . Heart attack Brother 11  . Schizophrenia Sister   . Diabetes Sister   . Tremor Sister   .  Heart attack Father     Review of Systems: The review of systems is per the HPI.  All other systems were reviewed and are negative.  Physical Exam: BP 160/100 mmHg  Pulse 77  Ht 5\' 3"  (1.6 m)  Wt 196 lb 1.9 oz (88.959 kg)  BMI 34.75 kg/m2 Patient is alert and in no acute distress. She is anxious. Skin is warm and dry. Color is normal.  HEENT is unremarkable. Normocephalic/atraumatic. PERRL. Sclera are nonicteric. Neck is supple. No masses. No JVD. Lungs are clear. Cardiac exam shows a regular rate and rhythm.  Occasional ectopic noted.  Abdomen is soft. Extremities are without edema. Gait and ROM are intact. No gross neurologic deficits noted.  Wt Readings from Last 3 Encounters:  10/15/14 196 lb 1.9 oz (88.959 kg)  10/01/14 200 lb 3.2 oz (90.81 kg)  09/19/14 196 lb (88.905 kg)    LABORATORY DATA/PROCEDURES:  EKG today shows NSR with PACs. Anterior T wave changes.   Lab Results  Component Value Date   WBC 7.1 09/19/2014   HGB 13.7 09/19/2014   HCT 41.3 09/19/2014   PLT 214 09/19/2014   GLUCOSE 92 09/19/2014   CHOL 183 05/04/2013   TRIG 137 05/04/2013   HDL 37* 05/04/2013   LDLCALC 119* 05/04/2013   ALT 40* 05/04/2013   AST 34 05/04/2013   NA 139 09/19/2014   K 4.2 09/19/2014   CL 98 09/19/2014   CREATININE 0.81 09/19/2014   BUN 15 09/19/2014   CO2 28 09/19/2014   TSH 0.966 05/04/2013   INR 1.2 05/28/2009   HGBA1C 5.8* 05/04/2013    BNP (last 3 results)  Recent Labs  09/19/14 1056  PROBNP 129.0*   Lexiscan Myoview Impression from July 2014 Exercise Capacity: [place] with no exercise. BP Response: Normal blood pressure response. Clinical Symptoms: Dizzy ECG Impression:  No significant ST segment change suggestive of ischemia. Comparison with Prior Nuclear Study: No images to compare  Overall Impression: Normal stress nuclear study.  LV Ejection Fraction: 80%. LV Wall Motion: NL LV Function; NL Wall Motion  [place]   Assessment / Plan: 1. PAF - in sinus today - will go with the plan as outlined from the ER - Toprol increased. Event monitor placed. Arrange echo. She is on Xarelto and will be transitioning to coumadin.   2. PFO - on anticoagulation  3. COPD  4. Hypertension -  Not controlled -  Toprol increased today - she will monitor and record her readings from home.  Change follow up with Dr. Martinique for 5 weeks from now. Toprol increased. Arrange echo.  Further disposition to follow.   Patient is agreeable to this plan and will call if any problems develop in the interim.   Burtis Junes, RN, [facility] [address] [place], [place]  [id number] [phone]

## 2014-10-15 NOTE — Patient Instructions (Signed)
We will be checking the following labs today BMET, CBC, TSH  We will get an echocardiogram  We will place an event monitor  Increase your Toprol to a whole pill each day - this has been sent to your drug store  Continue to monitor your BP at home  We will change your OV with Dr. Martinique to 5 weeks from now.  Call the [place] office at [phone] if you have any questions, problems or concerns.

## 2014-10-15 NOTE — Progress Notes (Signed)
Patient ID: Laurie Horton, female   DOB: 13-Feb-1946, 68 y.o.   MRN: [id number] [employer] 30 day cardiac event monitor applied to patient.  [employer] contacted to mail electrodes for sensitive skin to the patients' home.

## 2014-10-15 NOTE — Progress Notes (Signed)
[facility] [phone] Progress Note   Laurie Horton [id number] 68 y.o.  10/15/2014  1:50 PM  Chief Complaint:  I am diagnosed with A. fib.  I have a lot of dizziness.    History of Present Illness:  Laurie Horton came for her followup appointment.  She is recently diagnosed with A. fib.  She was feeling very dizziness and she visited her primary care physician who after testing recommended to go emergency room.  She was diagnosed with A. fib.  She was told to cut down her trazodone because it may be causing dizziness.  She was recommended Coumadin however patient is not started yet.  She is taking XARELTO and she is not happy about it.  She is very concerned about her physical health.  She was disappointed when her daughter asked her to help on Thanksgiving meal given the fact that she is so tired, weak a lot of physical health issues.  She sleeping on and off.  She takes Valium only as needed.  She denies any irritability or any anger.  She denies any active or passive suicidal thoughts or homicidal thought.  She continued to have a lot of rumination about her family issues.  She feels no one's love her and she's been neglected from her family member.  She lives by herself however her daughter lives close by.  She has not seen her son in a while.  On the last visit we had increased her trazodone from 100 mg to 150 mg.  She is compliant with Pristiq.  She has no tremors or shakes.  Her appetite is okay.  Suicidal Ideation: No Plan Formed: No Patient has means to carry out plan: No  Homicidal Ideation: No Plan Formed: No Patient has means to carry out plan: No   Past Psychiatric History/Hospitalization(s) Patient denies any previous history of psychiatric inpatient treatment, suicidal attempt, paranoia, hallucination or any mania.  She started seeing psychiatrist in 2006 after she had a stroke.  She had tried Cymbalta and Celexa in the past.  She also tried Ambien, Lunesta and Xanax but  developed allergies and side effects. Anxiety: Yes Bipolar Disorder: No Depression: Yes Mania: No Psychosis: No Schizophrenia: No Personality Disorder: No Hospitalization for psychiatric illness: No History of Electroconvulsive Shock Therapy: No Prior Suicide Attempts: No  Medical History; Patient has hypertension, fibromyalgia, scoliosis, headaches and history of stroke.  She has some weakness on her right hand which is minimal.  She also have loss of peripheral vision.  Her primary care physician is Dr. Everlene Farrier.  Substance Abuse History; Patient denies any history of substance use.   Review of Systems  Constitutional: Positive for malaise/fatigue. Negative for weight loss.  Musculoskeletal: Positive for back pain and joint pain.  Skin: Negative.   Neurological: Positive for dizziness.       Slight weakness in her right side  Psychiatric/Behavioral: The patient is nervous/anxious and has insomnia.    Psychiatric: Agitation: No Hallucination: No Depressed Mood: Yes Insomnia: Yes Hypersomnia: No Altered Concentration: No Feels Worthless: No Grandiose Ideas: No Belief In Special Powers: No New/Increased Substance Abuse: No Compulsions: No  Neurologic: Headache: Yes Seizure: No Paresthesias: No   Musculoskeletal: Strength & Muscle Tone: Patient endorsed some weakness in her right side but it is very minimal. Gait & Station: She has some difficulty in walking because of right-sided weakness. Patient leans: Front   Outpatient Encounter Prescriptions as of 10/15/2014  Medication Sig  . pregabalin (LYRICA) 50 MG capsule take  2 capsules by oral route daily  . albuterol (PROVENTIL) (2.5 MG/3ML) 0.083% nebulizer solution Take 2.5 mg by nebulization every 6 (six) hours as needed for wheezing or shortness of breath.   Marland Kitchen albuterol (VENTOLIN HFA) 108 (90 BASE) MCG/ACT inhaler Inhale 2 puffs into the lungs every 4 (four) hours as needed for wheezing. (Patient taking differently:  Inhale 2 puffs into the lungs every 4 (four) hours as needed for wheezing (rescue inhaler). )  . desvenlafaxine (PRISTIQ) 50 MG 24 hr tablet Take 1 tablet (50 mg total) by mouth daily.  . diazepam (VALIUM) 2 MG tablet Take 1 tablet (2 mg total) by mouth as needed for anxiety.  Marland Kitchen HYDROcodone-acetaminophen (NORCO/VICODIN) 5-325 MG per tablet Take 1 tablet by mouth 2 (two) times daily.  Marland Kitchen ipratropium (ATROVENT) 0.02 % nebulizer solution Take 500 mcg by nebulization every 4 (four) hours as needed (thightness).   . montelukast (SINGULAIR) 10 MG tablet TAKE 1 TABLET AT BEDTIME  . NORVASC 5 MG tablet TAKE 1 TABLET BY MOUTH EVERY DAY  . oxybutynin (DITROPAN) 5 MG tablet Take 5 mg by mouth daily.  . pantoprazole (PROTONIX) 40 MG tablet Take 1 tablet by mouth daily.  . polyethylene glycol (MIRALAX / GLYCOLAX) packet Take 17 g by mouth daily as needed.   . rivaroxaban (XARELTO) 20 MG TABS tablet Take 1 tablet (20 mg total) by mouth daily with supper.  Marland Kitchen tiZANidine (ZANAFLEX) 4 MG tablet Take 1 mg by mouth as needed for muscle spasms.   . traZODone (DESYREL) 100 MG tablet Take 1 tablet (100 mg total) by mouth at bedtime.  Marland Kitchen warfarin (COUMADIN) 5 MG tablet Take 1 tablet (5 mg total) by mouth daily.  . [DISCONTINUED] Cholecalciferol (VITAMIN D-3) 1000 UNITS CAPS Take 1 capsule by mouth daily.  . [DISCONTINUED] desvenlafaxine (PRISTIQ) 50 MG 24 hr tablet Take 1 tablet (50 mg total) by mouth daily.  . [DISCONTINUED] diazepam (VALIUM) 5 MG tablet Take 5 mg by mouth as needed for anxiety.   . [DISCONTINUED] fish oil-omega-3 fatty acids 1000 MG capsule Take 1 g by mouth daily.   . [DISCONTINUED] glucosamine-chondroitin 500-400 MG tablet Take 1 tablet by mouth daily.   . [DISCONTINUED] lidocaine (LIDODERM) 5 % Place 3 patches onto the skin daily as needed (for pain in back). Remove & Discard patch within 12 hours or as directed by MD  . [DISCONTINUED] montelukast (SINGULAIR) 10 MG tablet Take 10 mg by mouth every  morning.  . [DISCONTINUED] pregabalin (LYRICA) 50 MG capsule Take 100 mg by mouth at bedtime.  . [DISCONTINUED] TOPROL XL 25 MG 24 hr tablet TAKE 1/2 TABLET BY MOUTH EVERY DAY  . [DISCONTINUED] traZODone (DESYREL) 150 MG tablet Take 1 tablet (150 mg total) by mouth at bedtime.  . [DISCONTINUED] vitamin B-12 (CYANOCOBALAMIN) 1000 MCG tablet Take 1,000 mcg by mouth daily.    Recent Results (from the past 2160 hour(s))  CBC     Status: None   Collection Time: 09/19/14 10:56 AM  Result Value Ref Range   WBC 7.1 4.0 - 10.5 K/uL   RBC 4.44 3.87 - 5.11 MIL/uL   Hemoglobin 13.7 12.0 - 15.0 g/dL   HCT 41.3 36.0 - 46.0 %   MCV 93.0 78.0 - 100.0 fL   MCH 30.9 26.0 - 34.0 pg   MCHC 33.2 30.0 - 36.0 g/dL   RDW 12.9 11.5 - 15.5 %   Platelets 214 150 - 400 K/uL  Basic metabolic panel     Status: Abnormal  Collection Time: 09/19/14 10:56 AM  Result Value Ref Range   Sodium 139 137 - 147 mEq/L   Potassium 4.2 3.7 - 5.3 mEq/L   Chloride 98 96 - 112 mEq/L   CO2 28 19 - 32 mEq/L   Glucose, Bld 92 70 - 99 mg/dL   BUN 15 6 - 23 mg/dL   Creatinine, Ser 0.81 0.50 - 1.10 mg/dL   Calcium 9.6 8.4 - 10.5 mg/dL   GFR calc non Af Amer 73 (L) >90 mL/min   GFR calc Af Amer 85 (L) >90 mL/min    Comment: (NOTE) The eGFR has been calculated using the CKD EPI equation. This calculation has not been validated in all clinical situations. eGFR's persistently <90 mL/min signify possible Chronic Kidney Disease.    Anion gap 13 5 - 15  Pro b natriuretic peptide     Status: Abnormal   Collection Time: 09/19/14 10:56 AM  Result Value Ref Range   Pro B Natriuretic peptide (BNP) 129.0 (H) 0 - 125 pg/mL  I-stat troponin, ED (not at [facility])     Status: None   Collection Time: 09/19/14 11:01 AM  Result Value Ref Range   Troponin i, poc 0.00 0.00 - 0.08 ng/mL   Comment 3            Comment: Due to the release kinetics of cTnI, a negative result within the first hours of the onset of symptoms does not rule  out myocardial infarction with certainty. If myocardial infarction is still suspected, repeat the test at appropriate intervals.       Constitutional:  There were no vitals taken for this visit.   Mental Status Examination;  Patient is casually dressed and fairly groomed.  Her speech is fast, clear and coherent.  She described her mood tired and exhausted.  Her affect is mood appropriate.  She denies any auditory or visual hallucination.  She denies any active or passive suicidal thoughts or homicidal thoughts.  There are no delusions, paranoia or any obsessive thoughts although she has a lot of ruminative thoughts.  Her attention and concentration is fair.  Her psychomotor activity is slightly decreased.  There were no flight of ideas or any loose association.  Her thought process is circumstantial.  Her fund of knowledge is adequate.  She is alert and oriented x3.  Her cognition is grossly intact.  Her insight judgment and impulse control is okay.   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 Depression   . GERD (gastroesophageal reflux disease)   . HTN (hypertension)   . Hiatal hernia   . COPD (chronic obstructive pulmonary disease)   . Diverticulitis   . DVT (deep venous thrombosis)   . Obesity   . Hyperlipidemia   . PFO (patent foramen ovale)   . PAT (paroxysmal atrial tachycardia)   . Right middle lobe pneumonia 07/24/2011    First noted at admit 07/10/11. Persists on cxr 07/22/11. Cleared on CT 08/24/11. No further followup  . PULMONARY NODULE, LEFT  LOWER LOBE 10/14/2009    68m LLL nodule dec  2010. Stable and 67m in Oct 2012. No further fu  . TOBACCO ABUSE 06/04/2009  . Osteoporosis   . Fibromyalgia   . Stroke     Axis IV: Moderate   Plan:  I reviewed records from her recent emergency room and cardiologist visit.  Review blood work results on her current medication.  I will decrease trazodone to 100 mg which she was taking before.  I will continue Pristiq 50 mg daily I also suggested to take Valium 2 mg as needed since it can also contribute to dizziness.  She takes Valium for severe panic attack.  She has last fill prescription in April and she still has 7 or 8 tablets remaining.  One more time I encouraged to see a counselor but patient declined.  I had a long discussion with the patient about medication side effects and benefits.  Discuss postural hypotension with psychotropic medication.  Strongly encouraged to contact her primary care physician and cardiologist if she is not interested to take Coumadin.  Recommended to call [facility] if she feels worsening of the symptoms.  I will see her again in 2 months. Time spent 25 minutes.  More than 50% of the time spent in psychoeducation, counseling and coordination of care.  Discuss safety plan that anytime having active suicidal thoughts or homicidal thoughts then patient need to call 911 or go to the local emergency room.   Georg Ang T., MD 10/15/2014

## 2014-10-17 ENCOUNTER — Ambulatory Visit (HOSPITAL_COMMUNITY): Payer: Medicare Other | Attending: Cardiology

## 2014-10-17 DIAGNOSIS — I10 Essential (primary) hypertension: Secondary | ICD-10-CM | POA: Diagnosis not present

## 2014-10-17 DIAGNOSIS — K219 Gastro-esophageal reflux disease without esophagitis: Secondary | ICD-10-CM | POA: Insufficient documentation

## 2014-10-17 DIAGNOSIS — Q211 Atrial septal defect: Secondary | ICD-10-CM | POA: Insufficient documentation

## 2014-10-17 DIAGNOSIS — Z79899 Other long term (current) drug therapy: Secondary | ICD-10-CM

## 2014-10-17 DIAGNOSIS — J9610 Chronic respiratory failure, unspecified whether with hypoxia or hypercapnia: Secondary | ICD-10-CM | POA: Insufficient documentation

## 2014-10-17 DIAGNOSIS — I480 Paroxysmal atrial fibrillation: Secondary | ICD-10-CM | POA: Diagnosis present

## 2014-10-17 DIAGNOSIS — J449 Chronic obstructive pulmonary disease, unspecified: Secondary | ICD-10-CM | POA: Diagnosis not present

## 2014-10-17 DIAGNOSIS — J189 Pneumonia, unspecified organism: Secondary | ICD-10-CM | POA: Insufficient documentation

## 2014-10-17 DIAGNOSIS — Z87891 Personal history of nicotine dependence: Secondary | ICD-10-CM | POA: Insufficient documentation

## 2014-10-17 DIAGNOSIS — R9431 Abnormal electrocardiogram [ECG] [EKG]: Secondary | ICD-10-CM

## 2014-10-17 NOTE — Progress Notes (Signed)
2D Echo completed. 10/17/2014

## 2014-10-19 ENCOUNTER — Ambulatory Visit (INDEPENDENT_AMBULATORY_CARE_PROVIDER_SITE_OTHER): Payer: Medicare Other | Admitting: Pharmacist Clinician (PhC)/ Clinical Pharmacy Specialist

## 2014-10-19 DIAGNOSIS — Z7901 Long term (current) use of anticoagulants: Secondary | ICD-10-CM

## 2014-10-19 DIAGNOSIS — I480 Paroxysmal atrial fibrillation: Secondary | ICD-10-CM

## 2014-10-19 NOTE — Progress Notes (Signed)
Pt has not yet started warfarin.  Went thru discussion of safety, side effects, drug interactions on warfarin.  Pt has 11 tabs of Xarelto left.  Will start warfarin on 12/26, moved next appt with me to Jan 4.

## 2014-10-24 ENCOUNTER — Ambulatory Visit: Payer: Self-pay | Admitting: Pharmacist Clinician (PhC)/ Clinical Pharmacy Specialist

## 2014-10-30 ENCOUNTER — Ambulatory Visit: Payer: Self-pay | Admitting: Cardiology

## 2014-11-05 ENCOUNTER — Ambulatory Visit (INDEPENDENT_AMBULATORY_CARE_PROVIDER_SITE_OTHER): Payer: Medicare Other | Admitting: Pharmacist Clinician (PhC)/ Clinical Pharmacy Specialist

## 2014-11-05 VITALS — BP 140/90 | HR 84

## 2014-11-05 DIAGNOSIS — I480 Paroxysmal atrial fibrillation: Secondary | ICD-10-CM

## 2014-11-05 DIAGNOSIS — Z7901 Long term (current) use of anticoagulants: Secondary | ICD-10-CM

## 2014-11-05 LAB — POCT INR: INR: 3.4

## 2014-11-06 ENCOUNTER — Ambulatory Visit (INDEPENDENT_AMBULATORY_CARE_PROVIDER_SITE_OTHER): Payer: Medicare Other | Admitting: Emergency Medicine

## 2014-11-06 ENCOUNTER — Encounter: Payer: Self-pay | Admitting: Emergency Medicine

## 2014-11-06 VITALS — BP 147/89 | HR 76 | Temp 97.9°F | Resp 16 | Ht 63.5 in | Wt 198.0 lb

## 2014-11-06 DIAGNOSIS — R112 Nausea with vomiting, unspecified: Secondary | ICD-10-CM

## 2014-11-06 MED ORDER — ONDANSETRON 4 MG PO TBDP: 4.0000 mg | ORAL_TABLET | Freq: Three times a day (TID) | ORAL | Status: DC | PRN

## 2014-11-06 NOTE — Progress Notes (Addendum)
Subjective:  This chart was scribed for Laurie Jordan, MD by [name], ED Scribe at [facility].The patient was seen in exam room 23 and the patient's care was started at 9:11 AM.   Patient ID: Laurie Horton, female    DOB: 04/12/1946, 69 y.o.   MRN: [id number] Chief Complaint  Patient presents with  . Hypertension    BP has been very high   HPI HPI Comments: Laurie Horton is a 69 y.o. female with a history of A-fib who presents to [facility] complaining of elevated HTN. Pt reports being dizzy today, described as room spinning. She has nausea and a HA as associated symptoms. She states dropping to her knees the other day because of her dizziness and HA. Pt was placed on a Holter monitor, to see how often she is on an A-fib. When she goes to A-fib her breathing becomes labored. Also Dr. Martinique raised her metoprolol to one tablet BID, Yesterday her BP was 126//106. BP is 150/80 in the exam room. Pt is no longer on xarelto, she was switched to coumadin on 10/27/2014 for financial reasons. Last time she was seen here she was sent to the ED and the cardiologist sent her home after 5 hours. Pt has a thyroid test with no abnormal findings. Next appointment with Dr. Martinique is the 11/22/2013   Patient Active Problem List   Diagnosis Date Noted  . Long-term (current) use of anticoagulants 09/26/2014  . PAF (paroxysmal atrial fibrillation) 09/19/2014  . Chronic respiratory failure with hypoxia 06/27/2014  . COPD exacerbation 06/27/2014  . Chest wall pain 04/06/2014  . Abnormal ECG 05/10/2013  . Atypical chest pain 05/04/2013  . Preoperative respiratory examination 05/02/2013  . Hyperglycemia, drug-induced 10/12/2011  . Encounter for long-term (current) use of medications 08/21/2011  . PAT (paroxysmal atrial tachycardia) 08/21/2011  . Right middle lobe pneumonia 07/24/2011  . Oral thrush 07/24/2011  . UPPER RESPIRATORY INFECTION, VIRAL 07/11/2010  . OTHER ACUTE SINUSITIS  06/04/2010  . PULMONARY NODULE, LEFT LOWER LOBE 10/14/2009  . PATENT FORAMEN OVALE 10/14/2009  . DEPRESSION 06/04/2009  . HYPERTENSION 06/04/2009  . COPD (chronic obstructive pulmonary disease) 06/04/2009  . G E R D 06/04/2009  . SNORING, HX OF 06/04/2009   Past Medical History  Diagnosis Date  . Depression   . GERD (gastroesophageal reflux disease)   . HTN (hypertension)   . Hiatal hernia   . COPD (chronic obstructive pulmonary disease)   . Diverticulitis   . DVT (deep venous thrombosis)   . Obesity   . Hyperlipidemia   . PFO (patent foramen ovale)   . PAT (paroxysmal atrial tachycardia)   . Right middle lobe pneumonia 07/24/2011    First noted at admit 07/10/11. Persists on cxr 07/22/11. Cleared on CT 08/24/11. No further followup  . PULMONARY NODULE, LEFT LOWER LOBE 10/14/2009    1mm LLL nodule dec 2010. Stable and 61mm in Oct 2012. No further fu  . TOBACCO ABUSE 06/04/2009  . Osteoporosis   . Fibromyalgia   . Stroke    Past Surgical History  Procedure Laterality Date  . Total abdominal hysterectomy      post op needed oxygen was told "she gave them a scare"  . Knee arthroscopy  2000    left  . Laparoscopic cholecystectomy  04-16-2010    [facility]  . Tubal ligation    . US echocardiography  11/20/2009    EF 55-60%  . Cardiovascular stress test  12/26/2004  EF 74%. NO EVIDENCE OF ISCHEMIA   Allergies  Allergen Reactions  . Alprazolam     REACTION: stops breathing  . Budesonide-Formoterol Fumarate     bliusters  . Aciphex [Rabeprazole Sodium]     rash  . Ambien [Zolpidem]   . Avelox [Moxifloxacin Hcl In Nacl]   . Esomeprazole Magnesium     REACTION: "bouncing off walls"  . Eszopiclone   . Iodine     REACTION: swelling in throat  . Loratadine     claritin D causes shaking  . Lotrimin [Clotrimazole]   . Lunesta [Eszopiclone]     REACTION: "slept for a week"  . Other     Glue from ekg/heart monitor leads  . Oxcarbazepine Other (See Comments)    Causes deep  sleep and dizziness  . Pseudoephedrine Hcl Er   . Zolpidem Tartrate     REACTION: "slept for a week"  . Bextra [Valdecoxib] Rash  . Ceclor [Cefaclor] Rash  . Covera-Hs [Verapamil Hcl] Palpitations  . Dicyclomine Hcl Rash  . Tessalon Perles Rash   Prior to Admission medications   Medication Sig Start Date End Date Taking? Authorizing Provider  albuterol (PROVENTIL) (2.5 MG/3ML) 0.083% nebulizer solution Take 2.5 mg by nebulization every 6 (six) hours as needed for wheezing or shortness of breath.    Yes Historical Provider, MD  albuterol (VENTOLIN HFA) 108 (90 BASE) MCG/ACT inhaler Inhale 2 puffs into the lungs every 4 (four) hours as needed for wheezing. Patient taking differently: Inhale 2 puffs into the lungs every 4 (four) hours as needed for wheezing (rescue inhaler).  03/08/14  Yes Mancel Bale, PA-C  desvenlafaxine (PRISTIQ) 50 MG 24 hr tablet Take 1 tablet (50 mg total) by mouth daily. 10/15/14  Yes Kathlee Nations, MD  diazepam (VALIUM) 2 MG tablet Take 1 tablet (2 mg total) by mouth as needed for anxiety. 10/15/14  Yes Kathlee Nations, MD  HYDROcodone-acetaminophen (NORCO/VICODIN) 5-325 MG per tablet Take 1 tablet by mouth 2 (two) times daily.   Yes Historical Provider, MD  ipratropium (ATROVENT) 0.02 % nebulizer solution Take 500 mcg by nebulization every 4 (four) hours as needed (thightness).    Yes Brand Males, MD  metoprolol succinate (TOPROL XL) 25 MG 24 hr tablet Take 1 tablet (25 mg total) by mouth daily. 10/15/14  Yes Burtis Junes, NP  montelukast (SINGULAIR) 10 MG tablet TAKE 1 TABLET AT BEDTIME 10/11/14  Yes [name], MD  NORVASC 5 MG tablet TAKE 1 TABLET BY MOUTH EVERY DAY 08/22/14  Yes [name], MD  oxybutynin (DITROPAN) 5 MG tablet Take 5 mg by mouth daily.   Yes Historical Provider, MD  pantoprazole (PROTONIX) 40 MG tablet Take 1 tablet by mouth daily. 07/24/13  Yes Historical Provider, MD  polyethylene glycol (MIRALAX / GLYCOLAX) packet Take 17 g by mouth  daily as needed.    Yes Historical Provider, MD  pregabalin (LYRICA) 50 MG capsule take 2 capsules by oral route daily 08/03/14  Yes Historical Provider, MD  tiZANidine (ZANAFLEX) 4 MG tablet Take 1 mg by mouth as needed for muscle spasms.    Yes Historical Provider, MD  traZODone (DESYREL) 100 MG tablet Take 1 tablet (100 mg total) by mouth at bedtime. 10/15/14  Yes Kathlee Nations, MD  warfarin (COUMADIN) 5 MG tablet Take 1 tablet (5 mg total) by mouth daily. 10/01/14  Yes Peter M Martinique, MD   History   Social History  . Marital Status: Divorced  Spouse Name: N/A    Number of Children: 2  . Years of Education: N/A   Occupational History  . disability    Social History Main Topics  . Smoking status: Former Smoker    Quit date: 06/02/2010  . Smokeless tobacco: Not on file  . Alcohol Use: No  . Drug Use: No  . Sexual Activity: Not on file   Other Topics Concern  . Not on file   Social History Narrative   Review of Systems  Cardiovascular: Positive for palpitations.  Gastrointestinal: Positive for nausea.  Neurological: Positive for dizziness and headaches.      Objective:  BP 147/89 mmHg  Pulse 76  Temp(Src) 97.9 F (36.6 C)  Resp 16  Ht 5' 3.5" (1.613 m)  Wt 198 lb (89.812 kg)  BMI 34.52 kg/m2  SpO2 89%  Physical Exam  Constitutional: She is oriented to person, place, and time. She appears well-developed and well-nourished. No distress.  Pt complaining of dizziness.   HENT:  Head: Normocephalic and atraumatic.  Eyes: EOM are normal.  Neck: Normal range of motion.  Cardiovascular: Normal rate.   Irregular rhythm with a grade 1 murmur.  Pulmonary/Chest: Effort normal.  Neurological: She is alert and oriented to person, place, and time. No cranial nerve deficit. She exhibits normal muscle tone. Coordination normal.  No motor weakness or sensory changes.  Skin: Skin is warm and dry.  Psychiatric: She has a normal mood and affect. Her behavior is normal.  Nursing  note and vitals reviewed.         Assessment & Plan:   Patient having episodes of dizziness. She is concerned because her blood pressures at times are elevated. Here she is in atrial fibrillation with a normal blood pressure. I repeated it and received 120/84 reading. She has an appointment to see Dr. Martinique for follow-up. She will see me in one month. I did give her some Zofran to have her nausea. She will continue close follow-ups at the Coumadin clinic. I did tell her she can take 2 extra strength Tylenol a day for headache. She is currently on hydrocodone twice a day  I personally performed the services described in this documentation, which was scribed in my presence. The recorded information has been reviewed and is accurate.

## 2014-11-13 ENCOUNTER — Telehealth: Payer: Self-pay | Admitting: Emergency Medicine

## 2014-11-13 NOTE — Telephone Encounter (Signed)
Patient states that [employer] called her and states that Dr. Everlene Farrier has diagnosed patient with diabetes at her last OV. States that she is concerned because she does not know anything about this. States that [facility] is requesting more information about this. Please advise.  [phone]

## 2014-11-13 NOTE — Telephone Encounter (Signed)
Is this something that should be removed from her diagnosis list?  Please advise

## 2014-11-13 NOTE — Telephone Encounter (Signed)
Altha Harm who is a diabetic nurse for [facility] states pt stated she is not diabetic and she need to really clarify this asap so it can be noted in her chart Please call [phone]

## 2014-11-14 NOTE — Telephone Encounter (Signed)
Patient returned call  [phone]

## 2014-11-14 NOTE — Telephone Encounter (Signed)
Spoke to pt, she is aware and will remind dr. Everlene Farrier at her next appojnment

## 2014-11-14 NOTE — Telephone Encounter (Signed)
Left message for pt to call back  °

## 2014-11-14 NOTE — Telephone Encounter (Signed)
She has had hyperglycemia in the past without diagnosis of diabetes. At her next office visit we can change diagnosis from  Diabetes to  hyperglycemia

## 2014-11-22 ENCOUNTER — Ambulatory Visit (INDEPENDENT_AMBULATORY_CARE_PROVIDER_SITE_OTHER): Payer: Medicare Other | Admitting: *Deleted

## 2014-11-22 ENCOUNTER — Ambulatory Visit (INDEPENDENT_AMBULATORY_CARE_PROVIDER_SITE_OTHER): Payer: Medicare Other | Admitting: Cardiology

## 2014-11-22 ENCOUNTER — Encounter: Payer: Self-pay | Admitting: Cardiology

## 2014-11-22 VITALS — BP 146/84 | HR 62 | Ht 63.0 in | Wt 196.9 lb

## 2014-11-22 DIAGNOSIS — I480 Paroxysmal atrial fibrillation: Secondary | ICD-10-CM

## 2014-11-22 DIAGNOSIS — R739 Hyperglycemia, unspecified: Secondary | ICD-10-CM

## 2014-11-22 DIAGNOSIS — T50905A Adverse effect of unspecified drugs, medicaments and biological substances, initial encounter: Secondary | ICD-10-CM

## 2014-11-22 DIAGNOSIS — I10 Essential (primary) hypertension: Secondary | ICD-10-CM

## 2014-11-22 DIAGNOSIS — Z7901 Long term (current) use of anticoagulants: Secondary | ICD-10-CM

## 2014-11-22 DIAGNOSIS — R0789 Other chest pain: Secondary | ICD-10-CM

## 2014-11-22 LAB — POCT INR: INR: 4.9

## 2014-11-22 MED ORDER — METOPROLOL SUCCINATE ER 50 MG PO TB24: 50.0000 mg | ORAL_TABLET | Freq: Every day | ORAL | Status: DC

## 2014-11-22 NOTE — Progress Notes (Signed)
Laurie Horton Date of Birth: 1945-12-06 Medical Record #[id number]  History of Present Illness: Laurie Horton is seen for follow up atrial fibrillation. She has a history of  abnormal ECG, severe COPD, stroke after knee surgery in 2006. Evaluation at that time revealed a PFO by TEE. She was previously maintained on aspirin therapy. Recently she was seen by Dr. Everlene Farrier and noted to be in Afib. Ecg from that time showed NSR with PACs but apparently monitor showed AFib with RVR. She was seen in the ED and metoprolol was increased to 25 mg daily. She was started on Xarelto but then switched to Coumadin due to cost. She continued to complain of atrial fibrillation episodes. She feels weak and dizzy. She has sudden sharp chest pain that brings her to her knees. She develops "triple vision" She reports her BP has been out of control.  She was placed on an event monitor to assess the frequency of Afib.   Current Outpatient Prescriptions  Medication Sig Dispense Refill  . albuterol (PROVENTIL) (2.5 MG/3ML) 0.083% nebulizer solution Take 2.5 mg by nebulization every 6 (six) hours as needed for wheezing or shortness of breath.     Marland Kitchen albuterol (VENTOLIN HFA) 108 (90 BASE) MCG/ACT inhaler Inhale 2 puffs into the lungs every 4 (four) hours as needed for wheezing. (Patient taking differently: Inhale 2 puffs into the lungs every 4 (four) hours as needed for wheezing (rescue inhaler). ) 8.5 Inhaler 0  . desvenlafaxine (PRISTIQ) 50 MG 24 hr tablet Take 1 tablet (50 mg total) by mouth daily. 30 tablet 1  . diazepam (VALIUM) 2 MG tablet Take 1 tablet (2 mg total) by mouth as needed for anxiety. 30 tablet 0  . HYDROcodone-acetaminophen (NORCO/VICODIN) 5-325 MG per tablet Take 1 tablet by mouth 2 (two) times daily.    Marland Kitchen ipratropium (ATROVENT) 0.02 % nebulizer solution Take 500 mcg by nebulization every 4 (four) hours as needed (thightness).     . montelukast (SINGULAIR) 10 MG tablet TAKE 1 TABLET AT BEDTIME 30 tablet 4    . NORVASC 5 MG tablet TAKE 1 TABLET BY MOUTH EVERY DAY 30 tablet 4  . ondansetron (ZOFRAN-ODT) 4 MG disintegrating tablet Take 1 tablet (4 mg total) by mouth every 8 (eight) hours as needed for nausea or vomiting. 20 tablet 0  . oxybutynin (DITROPAN) 5 MG tablet Take 5 mg by mouth daily.    . pantoprazole (PROTONIX) 40 MG tablet Take 1 tablet by mouth daily.    . polyethylene glycol (MIRALAX / GLYCOLAX) packet Take 17 g by mouth daily as needed.     . pregabalin (LYRICA) 50 MG capsule take 2 capsules by oral route daily    . tiZANidine (ZANAFLEX) 4 MG tablet Take 1 mg by mouth as needed for muscle spasms.     . traZODone (DESYREL) 100 MG tablet Take 1 tablet (100 mg total) by mouth at bedtime. 30 tablet 1  . warfarin (COUMADIN) 5 MG tablet Take 1 tablet (5 mg total) by mouth daily. (Patient taking differently: Take 5 mg by mouth daily. 1 tablet sat-thurs. 1/2 tablet on friday) 30 tablet 2  . metoprolol succinate (TOPROL-XL) 50 MG 24 hr tablet Take 1 tablet (50 mg total) by mouth daily. Take with or immediately following a meal. 90 tablet 3   No current facility-administered medications for this visit.    Allergies  Allergen Reactions  . Alprazolam     REACTION: stops breathing  . Budesonide-Formoterol Fumarate  bliusters  . Aciphex [Rabeprazole Sodium]     rash  . Ambien [Zolpidem]   . Avelox [Moxifloxacin Hcl In Nacl]   . Esomeprazole Magnesium     REACTION: "bouncing off walls"  . Eszopiclone   . Iodine     REACTION: swelling in throat  . Loratadine     claritin D causes shaking  . Lotrimin [Clotrimazole]   . Lunesta [Eszopiclone]     REACTION: "slept for a week"  . Other     Glue from ekg/heart monitor leads  . Oxcarbazepine Other (See Comments)    Causes deep sleep and dizziness  . Pseudoephedrine Hcl Er   . Zolpidem Tartrate     REACTION: "slept for a week"  . Bextra [Valdecoxib] Rash  . Ceclor [Cefaclor] Rash  . Covera-Hs [Verapamil Hcl] Palpitations  .  Dicyclomine Hcl Rash  . Tessalon Perles Rash    Past Medical History  Diagnosis Date  . Depression   . GERD (gastroesophageal reflux disease)   . HTN (hypertension)   . Hiatal hernia   . COPD (chronic obstructive pulmonary disease)   . Diverticulitis   . DVT (deep venous thrombosis)   . Obesity   . Hyperlipidemia   . PFO (patent foramen ovale)   . PAT (paroxysmal atrial tachycardia)   . Right middle lobe pneumonia 07/24/2011    First noted at admit 07/10/11. Persists on cxr 07/22/11. Cleared on CT 08/24/11. No further followup  . PULMONARY NODULE, LEFT LOWER LOBE 10/14/2009    70mm LLL nodule dec 2010. Stable and 24mm in Oct 2012. No further fu  . TOBACCO ABUSE 06/04/2009  . Osteoporosis   . Fibromyalgia   . Stroke     Past Surgical History  Procedure Laterality Date  . Total abdominal hysterectomy      post op needed oxygen was told "she gave them a scare"  . Knee arthroscopy  2000    left  . Laparoscopic cholecystectomy  04-16-2010    [facility]  . Tubal ligation    . US echocardiography  11/20/2009    EF 55-60%  . Cardiovascular stress test  12/26/2004    EF 74%. NO EVIDENCE OF ISCHEMIA    History  Smoking status  . Former Smoker  . Quit date: 06/02/2010  Smokeless tobacco  . Not on file    History  Alcohol Use No    Family History  Problem Relation Age of Onset  . Dementia Mother   . Diabetes Mother   . Alzheimer's disease Mother   . Heart attack Brother 61  . Schizophrenia Sister   . Diabetes Sister   . Tremor Sister   . Heart attack Father     Review of Systems: The review of systems is per the HPI.  All other systems were reviewed and are negative.  Physical Exam: BP 146/84 mmHg  Pulse 62  Ht 5\' 3"  (1.6 m)  Wt 196 lb 14.4 oz (89.313 kg)  BMI 34.89 kg/m2 Patient is obese, alert and in no acute distress. She is anxious. Skin is warm and dry. Color is normal.  HEENT is unremarkable. Normocephalic/atraumatic. PERRL. Sclera are nonicteric. Neck is  supple. No masses. No JVD. Lungs are clear. Cardiac exam shows a regular rate and rhythm with frequent extrasystoles. No gallop or murmur.  Abdomen is soft. Extremities are without edema. Gait and ROM are intact. No gross neurologic deficits noted.  Wt Readings from Last 3 Encounters:  11/22/14 196 lb 14.4 oz (89.313 kg)  11/06/14 198 lb (89.812 kg)  10/15/14 196 lb 1.9 oz (88.959 kg)    LABORATORY DATA/PROCEDURES:    Lab Results  Component Value Date   WBC 8.0 10/15/2014   HGB 14.2 10/15/2014   HCT 43.1 10/15/2014   PLT 226.0 10/15/2014   GLUCOSE 85 10/15/2014   CHOL 183 05/04/2013   TRIG 137 05/04/2013   HDL 37* 05/04/2013   LDLCALC 119* 05/04/2013   ALT 40* 05/04/2013   AST 34 05/04/2013   NA 137 10/15/2014   K 3.7 10/15/2014   CL 99 10/15/2014   CREATININE 0.9 10/15/2014   BUN 16 10/15/2014   CO2 31 10/15/2014   TSH 2.82 10/15/2014   INR 4.9 11/22/2014   HGBA1C 5.8* 05/04/2013    BNP (last 3 results)  Recent Labs  09/19/14 1056  PROBNP 129.0*   Lexiscan Myoview Impression from July 2014 Exercise Capacity: [place] with no exercise. BP Response: Normal blood pressure response. Clinical Symptoms: Dizzy ECG Impression: No significant ST segment change suggestive of ischemia. Comparison with Prior Nuclear Study: No images to compare  Overall Impression: Normal stress nuclear study.  LV Ejection Fraction: 80%. LV Wall Motion: NL LV Function; NL Wall Motion  [place]  Event monitor tracings were personally reviewed in detail. She had copious recordings. The predominant rhythm is NSR with frequent PACs. She does have several short bursts of PAT and atrial fibrillation. The longest episode lasting 18 seconds. The episodes the patient recorded do not consistently correlate with AFib.    Echo 10/17/14: Study Conclusions  - Left ventricle: The cavity size was normal. There was mild concentric hypertrophy. Systolic function was normal. The estimated  ejection fraction was in the range of 60% to 65%. Wall motion was normal; there were no regional wall motion abnormalities. Doppler parameters are consistent with abnormal left ventricular relaxation (grade 1 diastolic dysfunction). - Aorta: Aortic root dimension: 44 mm (ED). - Ascending aorta: The ascending aorta was mildly dilated. - Left atrium: The atrium was mildly dilated.  Assessment / Plan: 1. PAF - in sinus predominantly with PACs.. Her symptoms particularly of chest pain are quite atypical. I have recommended increasing Toprol to 50 mg daily. Avoid use of beta agonists and caffeine. I would not recommend antiarrhythmic therapy unless she clearly has more sustained episodes. Continue Coumadin for anticoagulation. INR 4.9 today and pharmacy adjusted dose.I will follow up in 3 months.  2. PFO - on anticoagulation. Stay off ASA.  3. COPD  4. Hypertension -  Not controlled -  Toprol increased today - she will monitor and record her readings from home.  5. Aortic root enlargement. 44 mm by Echo. Recommend follow up in one year.

## 2014-11-22 NOTE — Patient Instructions (Signed)
Increase Toprol XL to 50 mg daily.  Continue your other therapy  I will see you in 3 months.

## 2014-11-29 ENCOUNTER — Ambulatory Visit (INDEPENDENT_AMBULATORY_CARE_PROVIDER_SITE_OTHER): Payer: Medicare Other | Admitting: *Deleted

## 2014-11-29 DIAGNOSIS — I480 Paroxysmal atrial fibrillation: Secondary | ICD-10-CM

## 2014-11-29 DIAGNOSIS — Z7901 Long term (current) use of anticoagulants: Secondary | ICD-10-CM

## 2014-11-29 LAB — POCT INR: INR: 3.2

## 2014-12-06 ENCOUNTER — Encounter: Payer: Self-pay | Admitting: Emergency Medicine

## 2014-12-06 ENCOUNTER — Ambulatory Visit (INDEPENDENT_AMBULATORY_CARE_PROVIDER_SITE_OTHER): Payer: Medicare Other | Admitting: Emergency Medicine

## 2014-12-06 VITALS — BP 144/80 | HR 69 | Temp 98.0°F | Resp 16 | Ht 63.0 in | Wt 196.2 lb

## 2014-12-06 DIAGNOSIS — M1990 Unspecified osteoarthritis, unspecified site: Secondary | ICD-10-CM

## 2014-12-06 DIAGNOSIS — M797 Fibromyalgia: Secondary | ICD-10-CM

## 2014-12-06 DIAGNOSIS — J449 Chronic obstructive pulmonary disease, unspecified: Secondary | ICD-10-CM

## 2014-12-06 LAB — COMPLETE METABOLIC PANEL WITH GFR
ALT: 14 U/L (ref 0–35)
AST: 11 U/L (ref 0–37)
Albumin: 4.1 g/dL (ref 3.5–5.2)
Alkaline Phosphatase: 57 U/L (ref 39–117)
BUN: 19 mg/dL (ref 6–23)
CHLORIDE: 98 meq/L (ref 96–112)
CO2: 31 meq/L (ref 19–32)
CREATININE: 0.91 mg/dL (ref 0.50–1.10)
Calcium: 10.1 mg/dL (ref 8.4–10.5)
GFR, EST AFRICAN AMERICAN: 75 mL/min
GFR, Est Non African American: 65 mL/min
Glucose, Bld: 97 mg/dL (ref 70–99)
Potassium: 4.5 mEq/L (ref 3.5–5.3)
Sodium: 136 mEq/L (ref 135–145)
Total Bilirubin: 0.5 mg/dL (ref 0.2–1.2)
Total Protein: 6.3 g/dL (ref 6.0–8.3)

## 2014-12-06 LAB — CBC WITH DIFFERENTIAL/PLATELET
BASOS PCT: 0 % (ref 0–1)
Basophils Absolute: 0 10*3/uL (ref 0.0–0.1)
EOS ABS: 0.1 10*3/uL (ref 0.0–0.7)
EOS PCT: 1 % (ref 0–5)
HCT: 39.7 % (ref 36.0–46.0)
Hemoglobin: 13.7 g/dL (ref 12.0–15.0)
Lymphocytes Relative: 22 % (ref 12–46)
Lymphs Abs: 1.7 10*3/uL (ref 0.7–4.0)
MCH: 32 pg (ref 26.0–34.0)
MCHC: 34.5 g/dL (ref 30.0–36.0)
MCV: 92.8 fL (ref 78.0–100.0)
MONOS PCT: 11 % (ref 3–12)
MPV: 10.9 fL (ref 8.6–12.4)
Monocytes Absolute: 0.8 10*3/uL (ref 0.1–1.0)
NEUTROS PCT: 66 % (ref 43–77)
Neutro Abs: 5 10*3/uL (ref 1.7–7.7)
Platelets: 217 10*3/uL (ref 150–400)
RBC: 4.28 MIL/uL (ref 3.87–5.11)
RDW: 13.6 % (ref 11.5–15.5)
WBC: 7.5 10*3/uL (ref 4.0–10.5)

## 2014-12-06 NOTE — Progress Notes (Signed)
Subjective:  This chart was scribed for [name], MD by Tamsen Roers, at [facility].  This patient was seen in room 23 and the patient's care was started at 9:01 AM.    Patient ID: Laurie Horton, female    DOB: [birth year], 69 y.o.   MRN: [id number]  HPI  HPI Comments: Laurie Horton is a 69 y.o. female who presents to [facility] for a follow up.    Atrial Fibrillation: She notes that she has had atrial fibrillation again. Patient notes that her doctor could not find a copy of her atrial fibrillation. Notes she has chest pain when she goes into a-fib but it is not prolonged.   Patient is on medication currently (Coumadin and Toprol (which was increased recently).  She notes of soreness in her chest.  She also has abdominal pain at times.   Depression: Patient notes that her depression has not changed.  She notes that she has mood disorder.          Patient Active Problem List   Diagnosis Date Noted  . Long-term (current) use of anticoagulants 09/26/2014  . PAF (paroxysmal atrial fibrillation) 09/19/2014  . Chronic respiratory failure with hypoxia 06/27/2014  . COPD exacerbation 06/27/2014  . Chest wall pain 04/06/2014  . Abnormal ECG 05/10/2013  . Atypical chest pain 05/04/2013  . Preoperative respiratory examination 05/02/2013  . Hyperglycemia, drug-induced 10/12/2011  . Encounter for long-term (current) use of medications 08/21/2011  . PAT (paroxysmal atrial tachycardia) 08/21/2011  . Right middle lobe pneumonia 07/24/2011  . Oral thrush 07/24/2011  . UPPER RESPIRATORY INFECTION, VIRAL 07/11/2010  . OTHER ACUTE SINUSITIS 06/04/2010  . PULMONARY NODULE, LEFT LOWER LOBE 10/14/2009  . PATENT FORAMEN OVALE 10/14/2009  . DEPRESSION 06/04/2009  . Essential hypertension 06/04/2009  . COPD (chronic obstructive pulmonary disease) 06/04/2009  . G E R D 06/04/2009  . SNORING, HX OF 06/04/2009   Past Medical History    Diagnosis Date  . Depression   . GERD (gastroesophageal reflux disease)   . HTN (hypertension)   . Hiatal hernia   . COPD (chronic obstructive pulmonary disease)   . Diverticulitis   . DVT (deep venous thrombosis)   . Obesity   . Hyperlipidemia   . PFO (patent foramen ovale)   . PAT (paroxysmal atrial tachycardia)   . Right middle lobe pneumonia 07/24/2011    First noted at admit 07/10/11. Persists on cxr 07/22/11. Cleared on CT 08/24/11. No further followup  . PULMONARY NODULE, LEFT LOWER LOBE 10/14/2009    24mm LLL nodule dec 2010. Stable and 18mm in Oct 2012. No further fu  . TOBACCO ABUSE 06/04/2009  . Osteoporosis   . Fibromyalgia   . Stroke    Past Surgical History  Procedure Laterality Date  . Total abdominal hysterectomy      post op needed oxygen was told "she gave them a scare"  . Knee arthroscopy  2000    left  . Laparoscopic cholecystectomy  04-16-2010    [facility]  . Tubal ligation    . US echocardiography  11/20/2009    EF 55-60%  . Cardiovascular stress test  12/26/2004    EF 74%. NO EVIDENCE OF ISCHEMIA   Allergies  Allergen Reactions  . Alprazolam     REACTION: stops breathing  . Budesonide-Formoterol Fumarate     bliusters  . Aciphex [Rabeprazole Sodium]     rash  . Ambien [Zolpidem]   .  Avelox [Moxifloxacin Hcl In Nacl]   . Esomeprazole Magnesium     REACTION: "bouncing off walls"  . Eszopiclone   . Iodine     REACTION: swelling in throat  . Loratadine     claritin D causes shaking  . Lotrimin [Clotrimazole]   . Lunesta [Eszopiclone]     REACTION: "slept for a week"  . Other     Glue from ekg/heart monitor leads  . Oxcarbazepine Other (See Comments)    Causes deep sleep and dizziness  . Pseudoephedrine Hcl Er   . Zolpidem Tartrate     REACTION: "slept for a week"  . Bextra [Valdecoxib] Rash  . Ceclor [Cefaclor] Rash  . Covera-Hs [Verapamil Hcl] Palpitations  . Dicyclomine Hcl Rash  . Tessalon Perles Rash   Prior to Admission medications    Medication Sig Start Date End Date Taking? Authorizing Provider  albuterol (PROVENTIL) (2.5 MG/3ML) 0.083% nebulizer solution Take 2.5 mg by nebulization every 6 (six) hours as needed for wheezing or shortness of breath.     Historical Provider, MD  albuterol (VENTOLIN HFA) 108 (90 BASE) MCG/ACT inhaler Inhale 2 puffs into the lungs every 4 (four) hours as needed for wheezing. Patient taking differently: Inhale 2 puffs into the lungs every 4 (four) hours as needed for wheezing (rescue inhaler).  03/08/14   Mancel Bale, PA-C  desvenlafaxine (PRISTIQ) 50 MG 24 hr tablet Take 1 tablet (50 mg total) by mouth daily. 10/15/14   Kathlee Nations, MD  diazepam (VALIUM) 2 MG tablet Take 1 tablet (2 mg total) by mouth as needed for anxiety. 10/15/14   Kathlee Nations, MD  HYDROcodone-acetaminophen (NORCO/VICODIN) 5-325 MG per tablet Take 1 tablet by mouth 2 (two) times daily.    Historical Provider, MD  ipratropium (ATROVENT) 0.02 % nebulizer solution Take 500 mcg by nebulization every 4 (four) hours as needed (thightness).     Brand Males, MD  metoprolol succinate (TOPROL-XL) 50 MG 24 hr tablet Take 1 tablet (50 mg total) by mouth daily. Take with or immediately following a meal. 11/22/14   Peter M Martinique, MD  montelukast (SINGULAIR) 10 MG tablet TAKE 1 TABLET AT BEDTIME 10/11/14   [name], MD  NORVASC 5 MG tablet TAKE 1 TABLET BY MOUTH EVERY DAY 08/22/14   [name], MD  ondansetron (ZOFRAN-ODT) 4 MG disintegrating tablet Take 1 tablet (4 mg total) by mouth every 8 (eight) hours as needed for nausea or vomiting. 11/06/14   [name], MD  oxybutynin (DITROPAN) 5 MG tablet Take 5 mg by mouth daily.    Historical Provider, MD  pantoprazole (PROTONIX) 40 MG tablet Take 1 tablet by mouth daily. 07/24/13   Historical Provider, MD  polyethylene glycol (MIRALAX / GLYCOLAX) packet Take 17 g by mouth daily as needed.     Historical Provider, MD  pregabalin (LYRICA) 50 MG capsule take 2 capsules by oral  route daily 08/03/14   Historical Provider, MD  tiZANidine (ZANAFLEX) 4 MG tablet Take 1 mg by mouth as needed for muscle spasms.     Historical Provider, MD  traZODone (DESYREL) 100 MG tablet Take 1 tablet (100 mg total) by mouth at bedtime. 10/15/14   Kathlee Nations, MD  warfarin (COUMADIN) 5 MG tablet Take 1 tablet (5 mg total) by mouth daily. Patient taking differently: Take 5 mg by mouth daily. 1 tablet sat-thurs. 1/2 tablet on friday 10/01/14   Peter M Martinique, MD   History   Social History  .  Marital Status: Divorced    Spouse Name: N/A    Number of Children: 2  . Years of Education: N/A   Occupational History  . disability    Social History Main Topics  . Smoking status: Former Smoker    Quit date: 06/02/2010  . Smokeless tobacco: Not on file  . Alcohol Use: No  . Drug Use: No  . Sexual Activity: Not on file   Other Topics Concern  . Not on file   Social History Narrative        Review of Systems  Constitutional: Negative for fever and chills.  Eyes: Negative for discharge and redness.  Cardiovascular: Positive for chest pain.  Gastrointestinal: Positive for abdominal pain.       Objective:   Physical Exam     CONSTITUTIONAL: Well developed/well nourished HEAD: Normocephalic/atraumatic EYES: EOMI/PERRL ENMT: Mucous membranes moist NECK: supple no meningeal signs SPINE/BACK:entire spine nontender CV: she has a 1/6 systolic murmur and she has occasional skipped beats but has a regular rhythm.  LUNGS: decreased breath sounds in the bases but no rales. ABDOMEN: soft, nontender, no rebound or guarding, bowel sounds noted throughout abdomen GU:no cva tenderness NEURO: Pt is awake/alert/appropriate, moves all extremitiesx4.  No facial droop.   EXTREMITIES: pulses normal/equal, full ROM SKIN: warm, color normal PSYCH: notes that she has a mood disorder.     Filed Vitals:   12/06/14 0851  BP: 144/80  Pulse: 69  Temp: 98 F (36.7 C)  TempSrc: Oral   Resp: 16  Height: 5\' 3"  (1.6 m)  Weight: 196 lb 3.2 oz (88.996 kg)  SpO2: 92%         Assessment & Plan:  Patient looks great today. She has had regular follow-ups with her specialist. She continues to have some chest wall discomfort but no symptoms suggestive of coronary disease.I personally performed the services described in this documentation, which was scribed in my presence. The recorded information has been reviewed and is accurate.

## 2014-12-07 ENCOUNTER — Ambulatory Visit (INDEPENDENT_AMBULATORY_CARE_PROVIDER_SITE_OTHER): Payer: Medicare Other | Admitting: Pharmacist Clinician (PhC)/ Clinical Pharmacy Specialist

## 2014-12-07 DIAGNOSIS — Z7901 Long term (current) use of anticoagulants: Secondary | ICD-10-CM

## 2014-12-07 DIAGNOSIS — I480 Paroxysmal atrial fibrillation: Secondary | ICD-10-CM

## 2014-12-07 LAB — POCT INR: INR: 2

## 2014-12-17 ENCOUNTER — Ambulatory Visit (HOSPITAL_COMMUNITY): Payer: Self-pay | Admitting: Psychiatry

## 2014-12-19 ENCOUNTER — Ambulatory Visit (INDEPENDENT_AMBULATORY_CARE_PROVIDER_SITE_OTHER): Payer: Medicare Other | Admitting: Pharmacist Clinician (PhC)/ Clinical Pharmacy Specialist

## 2014-12-19 DIAGNOSIS — I480 Paroxysmal atrial fibrillation: Secondary | ICD-10-CM

## 2014-12-19 DIAGNOSIS — Z7901 Long term (current) use of anticoagulants: Secondary | ICD-10-CM

## 2014-12-19 LAB — POCT INR: INR: 2.2

## 2014-12-20 ENCOUNTER — Encounter (HOSPITAL_COMMUNITY): Payer: Self-pay | Admitting: Psychiatry

## 2014-12-20 ENCOUNTER — Ambulatory Visit (INDEPENDENT_AMBULATORY_CARE_PROVIDER_SITE_OTHER): Payer: Medicare Other | Admitting: Psychiatry

## 2014-12-20 VITALS — BP 151/80 | HR 68 | Ht 63.0 in | Wt 178.0 lb

## 2014-12-20 DIAGNOSIS — F0630 Mood disorder due to known physiological condition, unspecified: Secondary | ICD-10-CM

## 2014-12-20 DIAGNOSIS — F4310 Post-traumatic stress disorder, unspecified: Secondary | ICD-10-CM

## 2014-12-20 MED ORDER — TRAZODONE HCL 100 MG PO TABS: 100.0000 mg | ORAL_TABLET | Freq: Every day | ORAL | Status: DC

## 2014-12-20 MED ORDER — DIAZEPAM 2 MG PO TABS: 2.0000 mg | ORAL_TABLET | ORAL | Status: DC | PRN

## 2014-12-20 MED ORDER — DESVENLAFAXINE SUCCINATE ER 50 MG PO TB24
50.0000 mg | ORAL_TABLET | Freq: Every day | ORAL | Status: DC
Start: 2014-12-20 — End: 2015-03-20

## 2014-12-20 NOTE — Progress Notes (Signed)
[facility] [phone] Progress Note   Laurie Horton [id number] 69 y.o.  12/20/2014  3:00 PM  Chief Complaint:  Medication management and follow-up.      History of Present Illness:  Laurie Horton came for her followup appointment.  She was taking trazodone 150 mg for little while because her pharmacy did not give 100 mg tablet.  When she called her pharmacy she was told to pick of 100 mg tablet of trazodone but she did not have money to fill the prescription.  Now she is taking 100 mg trazodone.  She is feeling better.  She is sleeping good .  She still have dizziness but they are less intense and less frequent.  Recently she visited her primary care physician and cardiologist.  She is concerned about her physical health especially A. fib .  Patient continues to struggle with chronic depression and she is not happy with her family member who does not support her a lot.  She endorsed financial strain and pain too much for her co-pays and doctors visit.  However she realized that she need to take the medication to help her physical and emotional health.  She is sleeping better.  She denies any irritability , anger or any severe mood swings.  She denies any major panic attack.  She still has Valium left over which she usually takes only as needed.  Patient is not interested in counseling despite she has lot of psychosocial issues and may get benefit from counseling to increase coping skills.  Patient denies any tremors or shakes.  Patient denies any suicidal thoughts.  Patient lives by herself and her daughter lives close by.  Her appetite is okay.  She lost weight from the past .  Her vitals are stable.  She wants to continue trazodone 100 mg at bedtime and Pristiq 50 mg daily.  She's also getting Lyrica from her primary care physician.  Suicidal Ideation: No Plan Formed: No Patient has means to carry out plan: No  Homicidal Ideation: No Plan Formed: No Patient has means to carry out plan:  No   Past Psychiatric History/Hospitalization(s) Patient denies any previous history of psychiatric inpatient treatment, suicidal attempt, paranoia, hallucination or any mania.  She started seeing psychiatrist in 2006 after she had a stroke.  She had tried Cymbalta and Celexa in the past.  She also tried Ambien, Lunesta and Xanax but developed allergies and side effects. Anxiety: Yes Bipolar Disorder: No Depression: Yes Mania: No Psychosis: No Schizophrenia: No Personality Disorder: No Hospitalization for psychiatric illness: No History of Electroconvulsive Shock Therapy: No Prior Suicide Attempts: No  Medical History; Patient has hypertension, fibromyalgia, scoliosis, headaches and history of stroke.  She has some weakness on her right hand which is minimal.  She also have loss of peripheral vision.  Her primary care physician is Dr. Everlene Farrier.  Substance Abuse History; Patient denies any history of substance use.   Review of Systems  Cardiovascular:       Dizziness  Musculoskeletal: Positive for back pain.  Psychiatric/Behavioral: The patient is nervous/anxious.    Psychiatric: Agitation: No Hallucination: No Depressed Mood: No Insomnia: No Hypersomnia: No Altered Concentration: No Feels Worthless: No Grandiose Ideas: No Belief In Special Powers: No New/Increased Substance Abuse: No Compulsions: No  Neurologic: Headache: Yes Seizure: No Paresthesias: No   Musculoskeletal: Strength & Muscle Tone: Patient endorsed some weakness in her right side but it is very minimal. Gait & Station: She has some difficulty in walking because of  right-sided weakness. Patient leans: Front   Outpatient Encounter Prescriptions as of 12/20/2014  Medication Sig  . albuterol (PROVENTIL) (2.5 MG/3ML) 0.083% nebulizer solution Take 2.5 mg by nebulization every 6 (six) hours as needed for wheezing or shortness of breath.   Marland Kitchen albuterol (VENTOLIN HFA) 108 (90 BASE) MCG/ACT inhaler Inhale 2  puffs into the lungs every 4 (four) hours as needed for wheezing. (Patient taking differently: Inhale 2 puffs into the lungs every 4 (four) hours as needed for wheezing (rescue inhaler). )  . desvenlafaxine (PRISTIQ) 50 MG 24 hr tablet Take 1 tablet (50 mg total) by mouth daily.  . diazepam (VALIUM) 2 MG tablet Take 1 tablet (2 mg total) by mouth as needed for anxiety.  Marland Kitchen HYDROcodone-acetaminophen (NORCO/VICODIN) 5-325 MG per tablet Take 1 tablet by mouth 2 (two) times daily.  Marland Kitchen ipratropium (ATROVENT) 0.02 % nebulizer solution Take 500 mcg by nebulization every 4 (four) hours as needed (thightness).   . metoprolol succinate (TOPROL-XL) 50 MG 24 hr tablet Take 1 tablet (50 mg total) by mouth daily. Take with or immediately following a meal.  . montelukast (SINGULAIR) 10 MG tablet TAKE 1 TABLET AT BEDTIME  . NORVASC 5 MG tablet TAKE 1 TABLET BY MOUTH EVERY DAY  . ondansetron (ZOFRAN-ODT) 4 MG disintegrating tablet Take 1 tablet (4 mg total) by mouth every 8 (eight) hours as needed for nausea or vomiting.  Marland Kitchen oxybutynin (DITROPAN-XL) 10 MG 24 hr tablet Take 10 mg by mouth daily.  . pantoprazole (PROTONIX) 40 MG tablet Take 1 tablet by mouth daily.  . polyethylene glycol (MIRALAX / GLYCOLAX) packet Take 17 g by mouth daily as needed.   . pregabalin (LYRICA) 50 MG capsule take 2 capsules by oral route daily  . tiZANidine (ZANAFLEX) 4 MG tablet Take 1 mg by mouth as needed for muscle spasms.   . traZODone (DESYREL) 100 MG tablet Take 1 tablet (100 mg total) by mouth at bedtime.  Marland Kitchen warfarin (COUMADIN) 5 MG tablet Take 1 tablet (5 mg total) by mouth daily. (Patient taking differently: Take 5 mg by mouth daily. 1 tablet sat-thurs. 1/2 tablet on friday)  . [DISCONTINUED] desvenlafaxine (PRISTIQ) 50 MG 24 hr tablet Take 1 tablet (50 mg total) by mouth daily.  . [DISCONTINUED] diazepam (VALIUM) 2 MG tablet Take 1 tablet (2 mg total) by mouth as needed for anxiety.  . [DISCONTINUED] oxybutynin (DITROPAN) 5 MG  tablet Take 5 mg by mouth daily.  . [DISCONTINUED] traZODone (DESYREL) 100 MG tablet Take 1 tablet (100 mg total) by mouth at bedtime.    Recent Results (from the past 2160 hour(s))  TSH     Status: None   Collection Time: 10/15/14 11:57 AM  Result Value Ref Range   TSH 2.82 0.35 - 4.50 uIU/mL  Basic metabolic panel     Status: None   Collection Time: 10/15/14 11:57 AM  Result Value Ref Range   Sodium 137 135 - 145 mEq/L   Potassium 3.7 3.5 - 5.1 mEq/L   Chloride 99 96 - 112 mEq/L   CO2 31 19 - 32 mEq/L   Glucose, Bld 85 70 - 99 mg/dL   BUN 16 6 - 23 mg/dL   Creatinine, Ser 0.9 0.4 - 1.2 mg/dL   Calcium 10.0 8.4 - 10.5 mg/dL   GFR 66.01 >60.00 mL/min  CBC     Status: None   Collection Time: 10/15/14 11:57 AM  Result Value Ref Range   WBC 8.0 4.0 - 10.5 K/uL  RBC 4.50 3.87 - 5.11 Mil/uL   Platelets 226.0 150.0 - 400.0 K/uL   Hemoglobin 14.2 12.0 - 15.0 g/dL   HCT 43.1 36.0 - 46.0 %   MCV 95.7 78.0 - 100.0 fl   MCHC 33.0 30.0 - 36.0 g/dL   RDW 13.4 11.5 - 15.5 %  POCT INR     Status: None   Collection Time: 11/05/14  7:50 AM  Result Value Ref Range   INR 3.4   POCT INR     Status: None   Collection Time: 11/22/14  2:38 PM  Result Value Ref Range   INR 4.9   POCT INR     Status: None   Collection Time: 11/29/14  9:36 AM  Result Value Ref Range   INR 3.2   CBC with Differential/Platelet     Status: None   Collection Time: 12/06/14  9:15 AM  Result Value Ref Range   WBC 7.5 4.0 - 10.5 K/uL   RBC 4.28 3.87 - 5.11 MIL/uL   Hemoglobin 13.7 12.0 - 15.0 g/dL   HCT 39.7 36.0 - 46.0 %   MCV 92.8 78.0 - 100.0 fL   MCH 32.0 26.0 - 34.0 pg   MCHC 34.5 30.0 - 36.0 g/dL   RDW 13.6 11.5 - 15.5 %   Platelets 217 150 - 400 K/uL   MPV 10.9 8.6 - 12.4 fL   Neutrophils Relative % 66 43 - 77 %   Neutro Abs 5.0 1.7 - 7.7 K/uL   Lymphocytes Relative 22 12 - 46 %   Lymphs Abs 1.7 0.7 - 4.0 K/uL   Monocytes Relative 11 3 - 12 %   Monocytes Absolute 0.8 0.1 - 1.0 K/uL   Eosinophils  Relative 1 0 - 5 %   Eosinophils Absolute 0.1 0.0 - 0.7 K/uL   Basophils Relative 0 0 - 1 %   Basophils Absolute 0.0 0.0 - 0.1 K/uL   Smear Review Criteria for review not met   COMPLETE METABOLIC PANEL WITH GFR     Status: None   Collection Time: 12/06/14  9:15 AM  Result Value Ref Range   Sodium 136 135 - 145 mEq/L   Potassium 4.5 3.5 - 5.3 mEq/L   Chloride 98 96 - 112 mEq/L   CO2 31 19 - 32 mEq/L   Glucose, Bld 97 70 - 99 mg/dL   BUN 19 6 - 23 mg/dL   Creat 0.91 0.50 - 1.10 mg/dL   Total Bilirubin 0.5 0.2 - 1.2 mg/dL   Alkaline Phosphatase 57 39 - 117 U/L   AST 11 0 - 37 U/L   ALT 14 0 - 35 U/L   Total Protein 6.3 6.0 - 8.3 g/dL   Albumin 4.1 3.5 - 5.2 g/dL   Calcium 10.1 8.4 - 10.5 mg/dL   GFR, Est African American 75 mL/min   GFR, Est Non African American 65 mL/min    Comment:   The estimated GFR is a calculation valid for adults (>=47 years old) that uses the CKD-EPI algorithm to adjust for age and sex. It is   not to be used for children, pregnant women, hospitalized patients,    patients on dialysis, or with rapidly changing kidney function. According to the NKDEP, eGFR >89 is normal, 60-89 shows mild impairment, 30-59 shows moderate impairment, 15-29 shows severe impairment and <15 is ESRD.     POCT INR     Status: None   Collection Time: 12/07/14  8:58 AM  Result Value  Ref Range   INR 2   POCT INR     Status: None   Collection Time: 12/19/14 11:47 AM  Result Value Ref Range   INR 2.2       Constitutional:  BP 151/80 mmHg  Pulse 68  Ht '5\' 3"'  (1.6 m)  Wt 178 lb (80.74 kg)  BMI 31.54 kg/m2   Mental Status Examination;  Patient is casually dressed and fairly groomed.  Her speech is fast, clear and coherent.  She described her mood euthymic and her affect is appropriate.  She denies any auditory or visual hallucination.  She denies any active or passive suicidal thoughts or homicidal thoughts.  There are no delusions, paranoia or any obsessive thoughts  although she has a lot of ruminative thoughts.  Her attention and concentration is fair.  Her psychomotor activity is slightly decreased.  There were no flight of ideas or any loose association.  Her thought process is circumstantial.  Her fund of knowledge is adequate.  She is alert and oriented x3.  Her cognition is grossly intact.  Her insight judgment and impulse control is okay.   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 Depression   . GERD (gastroesophageal reflux disease)   . HTN (hypertension)   . Hiatal hernia   . COPD (chronic obstructive pulmonary disease)   . Diverticulitis   . DVT (deep venous thrombosis)   . Obesity   . Hyperlipidemia   . PFO (patent foramen ovale)   . PAT (paroxysmal atrial tachycardia)   . Right middle lobe pneumonia 07/24/2011    First noted at admit 07/10/11. Persists on cxr 07/22/11. Cleared on CT 08/24/11. No further followup  . PULMONARY NODULE, LEFT LOWER LOBE 10/14/2009    82m LLL nodule dec 2010. Stable and 4107min Oct 2012. No further fu  . TOBACCO ABUSE 06/04/2009  . Osteoporosis   . Fibromyalgia   . Stroke     Plan:  I review her records , recent blood work and her current medication.  Patient is feeling better since trazodone decreased 100 mg at bedtime.  That she has psychosocial issues but she is not interested in counseling.  She is using Valium only as needed.  She's also taking Lyrica from her primary care physician.  Discussed medication side effects and benefits.  I will continue Pristiq 50 mg daily, trazodone 100 mg at bedtime and Valium 2 mg as needed for severe panic  attack.  Recommended to call [facility] if she has any question, concern or if she feels worsening of the symptom.  Follow-up in 3 months. Time spent 25 minutes.  More than 50% of the time spent in psychoeducation, counseling and coordination of care.  Discuss safety plan that anytime having active suicidal thoughts or homicidal thoughts then patient need to call 911 or go to the local emergency room.   ARFEEN,SYED T., MD 12/20/2014

## 2014-12-27 ENCOUNTER — Telehealth: Payer: Self-pay | Admitting: Emergency Medicine

## 2014-12-27 NOTE — Telephone Encounter (Signed)
Left a detailed message stating this pt is not a diabetic. Advised to call back if she wanted to speak to me.

## 2014-12-27 NOTE — Telephone Encounter (Signed)
Margreta Journey, a diabetic health coach for [facility], called and states that patient insists she is not a diabetic. Please confirm this with the patient and the health coach. Call [place] at [phone]. OK to leave a detailed VM.

## 2014-12-27 NOTE — Telephone Encounter (Signed)
The patient is not a diabetic she has had normal glucoses she had one hemoglobin A1c of 5.8 years ago and they put the diagnosis in

## 2014-12-27 NOTE — Telephone Encounter (Signed)
Dr. Everlene Farrier I don't see this diagnosis in the problem list. She is not a diabetic, correct?

## 2015-01-07 ENCOUNTER — Ambulatory Visit (INDEPENDENT_AMBULATORY_CARE_PROVIDER_SITE_OTHER): Payer: Medicare Other | Admitting: Pharmacist Clinician (PhC)/ Clinical Pharmacy Specialist

## 2015-01-07 DIAGNOSIS — I480 Paroxysmal atrial fibrillation: Secondary | ICD-10-CM

## 2015-01-07 DIAGNOSIS — Z7901 Long term (current) use of anticoagulants: Secondary | ICD-10-CM

## 2015-01-07 LAB — POCT INR: INR: 3

## 2015-01-11 ENCOUNTER — Telehealth: Payer: Self-pay

## 2015-01-11 NOTE — Telephone Encounter (Signed)
Pt called about labs. Let her know that they were normal and that we sent a letter. She said she never received it.

## 2015-01-20 ENCOUNTER — Other Ambulatory Visit: Payer: Self-pay | Admitting: Emergency Medicine

## 2015-01-26 ENCOUNTER — Other Ambulatory Visit: Payer: Self-pay | Admitting: Physician Assistant

## 2015-02-04 ENCOUNTER — Ambulatory Visit (INDEPENDENT_AMBULATORY_CARE_PROVIDER_SITE_OTHER): Payer: Medicare Other | Admitting: Pharmacist Clinician (PhC)/ Clinical Pharmacy Specialist

## 2015-02-04 DIAGNOSIS — Z7901 Long term (current) use of anticoagulants: Secondary | ICD-10-CM | POA: Diagnosis not present

## 2015-02-04 DIAGNOSIS — I480 Paroxysmal atrial fibrillation: Secondary | ICD-10-CM | POA: Diagnosis not present

## 2015-02-04 LAB — POCT INR: INR: 2.7

## 2015-02-13 ENCOUNTER — Ambulatory Visit: Payer: Medicare Other | Attending: Emergency Medicine | Admitting: Physical Therapy

## 2015-02-13 ENCOUNTER — Ambulatory Visit: Payer: Medicare Other | Admitting: Physical Therapy

## 2015-02-13 DIAGNOSIS — M6281 Muscle weakness (generalized): Secondary | ICD-10-CM | POA: Diagnosis not present

## 2015-02-13 DIAGNOSIS — R269 Unspecified abnormalities of gait and mobility: Secondary | ICD-10-CM | POA: Insufficient documentation

## 2015-02-15 NOTE — Therapy (Signed)
[facility] [address] [place], [place], [address] Phone: [phone]   Fax:  [phone]  Physical Therapy Evaluation  Patient Details  Name: Laurie Horton MRN: [id number] Date of Birth: [birth year] Referring Provider:  [name], MD  Encounter Date: 02/13/2015      PT End of Session - 02/15/15 1225    Visit Number 1   Number of Visits 9   Date for PT Re-Evaluation 04/14/15   PT Start Time 0805   PT Stop Time 0845   PT Time Calculation (min) 40 min   Activity Tolerance Patient tolerated treatment well   Behavior During Therapy [facility] for tasks assessed/performed      Past Medical History  Diagnosis Date  . Depression   . GERD (gastroesophageal reflux disease)   . HTN (hypertension)   . Hiatal hernia   . COPD (chronic obstructive pulmonary disease)   . Diverticulitis   . DVT (deep venous thrombosis)   . Obesity   . Hyperlipidemia   . PFO (patent foramen ovale)   . PAT (paroxysmal atrial tachycardia)   . Right middle lobe pneumonia 07/24/2011    First noted at admit 07/10/11. Persists on cxr 07/22/11. Cleared on CT 08/24/11. No further followup  . PULMONARY NODULE, LEFT LOWER LOBE 10/14/2009    5mm LLL nodule dec 2010. Stable and 4mm in Oct 2012. No further fu  . TOBACCO ABUSE 06/04/2009  . Osteoporosis   . Fibromyalgia   . Stroke     Past Surgical History  Procedure Laterality Date  . Total abdominal hysterectomy      post op needed oxygen was told "she gave them a scare"  . Knee arthroscopy  2000    left  . Laparoscopic cholecystectomy  04-16-2010    [facility]  . Tubal ligation    . US echocardiography  11/20/2009    EF 55-60%  . Cardiovascular stress test  12/26/2004    EF 74%. NO EVIDENCE OF ISCHEMIA    There were no vitals filed for this visit.  Visit Diagnosis:  Abnormality of gait  Muscle weakness of lower extremity          [facility] PT Assessment - 02/15/15 1217    Assessment   Medical Diagnosis fibromyalgia   Precautions   Precautions Fall  avoid lifting over 5 lbs   Home Environment   Living Enviornment Private residence   Living Arrangements Alone   Type of Home House   Home Access Stairs to enter   Entrance Stairs-Number of Steps 5   Entrance Stairs-Rails Left   Home Layout Two level;Bed/bath upstairs   Alternate Level Stairs-Number of Steps --  flight of steps   Alternate Level Stairs-Rails Can reach both   [employer] - single point;Tub bench   Prior Function   Level of Independence Independent with basic ADLs;Independent with homemaking with ambulation;Independent with homemaking with wheelchair;Independent with gait   Vocation Retired   Leisure Enjoys walking dog    Observation/Other Assessments   Focus on Therapeutic Outcomes (FOTO)  FOTO Intake score 61/100   Strength   Overall Strength Comments painful to touch during MMT   Strength Assessment Site Hip;Knee;Ankle   Right/Left Hip Right;Left   Right Hip Flexion 3+/5   Left Hip Flexion 3/5   Right/Left Knee Right;Left   Right Knee Flexion 4/5   Right Knee Extension 4/5   Left Knee Flexion 3-/5   Left Knee Extension 3/5   Right/Left Ankle Right;Left  Right Ankle Dorsiflexion --  grossly tested at least 3/5 bilateral ankles   Transfers   Transfers Sit to Stand;Stand to Sit   Sit to Stand 5: Supervision;With upper extremity assist;From chair/3-in-1   Stand to Sit 5: Supervision;With upper extremity assist;To chair/3-in-1   Ambulation/Gait   Ambulation/Gait Yes   Ambulation/Gait Assistance 5: Supervision   Ambulation Distance (Feet) 60 Feet   Gait Pattern Antalgic;Wide base of support  R shoulder lower, slowed gait, LLE appears longer   Ambulation Surface Level;Indoor   Gait velocity 13.86 sec=2.37 ft/sec   Standardized Balance Assessment   Standardized Balance Assessment Berg Balance Test;Timed Up and Go Test   Berg Balance Test   Sit to Stand Able to stand without using  hands and stabilize independently   Standing Unsupported Able to stand safely 2 minutes   Sitting with Back Unsupported but Feet Supported on Floor or Stool Able to sit safely and securely 2 minutes   Stand to Sit Sits safely with minimal use of hands   Transfers Able to transfer safely, definite need of hands   Standing Unsupported with Eyes Closed Able to stand 10 seconds with supervision   Standing Ubsupported with Feet Together Able to place feet together independently and stand for 1 minute with supervision   From Standing, Reach Forward with Outstretched Arm Can reach forward >12 cm safely (5")   From Standing Position, Pick up Object from Floor Unable to try/needs assist to keep balance   From Standing Position, Turn to Look Behind Over each Shoulder Looks behind from both sides and weight shifts well   Turn 360 Degrees Able to turn 360 degrees safely in 4 seconds or less   Standing Unsupported, Alternately Place Feet on Step/Stool Needs assistance to keep from falling or unable to try   Standing Unsupported, One Foot in Front Able to take small step independently and hold 30 seconds   Standing on One Leg Unable to try or needs assist to prevent fall   Total Score 38   Timed Up and Go Test   TUG Normal TUG   Normal TUG (seconds) 24.54                                PT Long Term Goals - 02/15/15 1230    PT LONG TERM GOAL #1   Title Pt will independently perform HEP for improved balance and strength. (Target 03/15/15)   Time 4   Period Weeks   Status New   PT LONG TERM GOAL #2   Title Pt will improve TUG score to less than or equal to 20 seconds for decreased fall risk.   Time 4   Period Weeks   Status New   PT LONG TERM GOAL #3   Title Pt will improve Berg score to at least 43/56 for decreased fall risk.   Time 4   Period Weeks   Status New   PT LONG TERM GOAL #4   Title Pt will improve gait velocity to at least 2.62 ft/sec for improved gait  efficiency and safety.   Time 4   Period Weeks   Status New   PT LONG TERM GOAL #5   Title Pt will verbalize plans for continued community fitness upon D/C from PT.   Time 4   Period Weeks   Status New               Plan -  02/15/15 1226    Clinical Impression Statement Pt is a 69 year old female who presents to OP PT upon the recommendation of her physician for leg strengthening and balance.  Pt has longstanding history of generalized pain due to fibromyalgia.  Pt is at fall risk per [facility] test score of 38/56, TUG score of 24.54 seconds.  Pt has slowed, antalgic gait speed of 2.37 ft/sec.  Pt would benefit from further skilled PT to address general program of stretching, strengthening, balance and gait for improved functional mobility and decreased fall risk.   Pt will benefit from skilled therapeutic intervention in order to improve on the following deficits Abnormal gait;Decreased activity tolerance;Decreased balance;Decreased mobility;Decreased strength;Difficulty walking;Pain   Rehab Potential Good   Clinical Impairments Affecting Rehab Potential Longstanding history of pain; patient is painful to touch during MMT   PT Frequency 2x / week   PT Duration 4 weeks  plus eval   PT Treatment/Interventions ADLs/Self Care Home Management;Functional mobility training;Gait training;Therapeutic activities;Therapeutic exercise;Balance training;Neuromuscular re-education;Patient/family education   PT Next Visit Plan Initiate OTAGO or other general exercis program for stretch, strength, balance   Consulted and Agree with Plan of Care Patient         Problem List Patient Active Problem List   Diagnosis Date Noted  . Long-term (current) use of anticoagulants 09/26/2014  . PAF (paroxysmal atrial fibrillation) 09/19/2014  . Chronic respiratory failure with hypoxia 06/27/2014  . COPD exacerbation 06/27/2014  . Chest wall pain 04/06/2014  . Abnormal ECG 05/10/2013  . Atypical  chest pain 05/04/2013  . Preoperative respiratory examination 05/02/2013  . Hyperglycemia, drug-induced 10/12/2011  . Encounter for long-term (current) use of medications 08/21/2011  . PAT (paroxysmal atrial tachycardia) 08/21/2011  . Right middle lobe pneumonia 07/24/2011  . Oral thrush 07/24/2011  . UPPER RESPIRATORY INFECTION, VIRAL 07/11/2010  . OTHER ACUTE SINUSITIS 06/04/2010  . PULMONARY NODULE, LEFT LOWER LOBE 10/14/2009  . PATENT FORAMEN OVALE 10/14/2009  . DEPRESSION 06/04/2009  . Essential hypertension 06/04/2009  . COPD (chronic obstructive pulmonary disease) 06/04/2009  . G E R D 06/04/2009  . SNORING, HX OF 06/04/2009    Lealer Marsland W. 02/15/2015, 12:35 PM  Lonia Blood, PT 02/15/2015 12:35 PM Phone: [phone] Fax: [phone]   [facility] [address] [place], [place], [address] Phone: [phone]   Fax:  [phone]

## 2015-02-19 ENCOUNTER — Ambulatory Visit: Payer: Medicare Other | Admitting: Rehabilitative and Restorative Service Providers"

## 2015-02-19 DIAGNOSIS — R269 Unspecified abnormalities of gait and mobility: Secondary | ICD-10-CM | POA: Diagnosis not present

## 2015-02-19 DIAGNOSIS — M6281 Muscle weakness (generalized): Secondary | ICD-10-CM

## 2015-02-19 NOTE — Patient Instructions (Addendum)
Quad Set   With other leg bent, foot flat, slowly tighten muscles on thigh of straight leg while counting out loud to __5__. Repeat with other leg. Repeat _10___ times. Do __2__ sessions per day.  http://gt2.exer.us/276   Copyright  VHI. All rights reserved.  HIP: Flexion / KNEE: Extension, Straight Leg Raise   Raise leg, keeping knee straight. Perform slowly. _5-10__ reps per set, _1__ sets per day. *do on the right side and try on the left side as tolerated*  Copyright  VHI. All rights reserved.  Clam Shell 45 Degrees: Leg Lift   Lying with hips and knees bent 45, one pillow between knees and ankles. Rotate knee upward. Be sure pelvis does not roll backward. Do not arch back. Do _5-10__ times, each leg, _2__ times per day.  http://ss.exer.us/77   Copyright  VHI. All rights reserved.

## 2015-02-19 NOTE — Therapy (Signed)
[facility] [address] [place], [place], [address] Phone: [phone]   Fax:  [phone]  Physical Therapy Treatment  Patient Details  Name: Laurie Horton MRN: [id number] Date of Birth: [birth year] Referring Provider:  [name], MD  Encounter Date: 02/19/2015      PT End of Session - 02/19/15 1346    Visit Number 2   Number of Visits 9   Date for PT Re-Evaluation 04/14/15   PT Start Time 0804   PT Stop Time 0848   PT Time Calculation (min) 44 min   Activity Tolerance Patient tolerated treatment well   Behavior During Therapy [facility] for tasks assessed/performed      Past Medical History  Diagnosis Date  . Depression   . GERD (gastroesophageal reflux disease)   . HTN (hypertension)   . Hiatal hernia   . COPD (chronic obstructive pulmonary disease)   . Diverticulitis   . DVT (deep venous thrombosis)   . Obesity   . Hyperlipidemia   . PFO (patent foramen ovale)   . PAT (paroxysmal atrial tachycardia)   . Right middle lobe pneumonia 07/24/2011    First noted at admit 07/10/11. Persists on cxr 07/22/11. Cleared on CT 08/24/11. No further followup  . PULMONARY NODULE, LEFT LOWER LOBE 10/14/2009    5mm LLL nodule dec 2010. Stable and 4mm in Oct 2012. No further fu  . TOBACCO ABUSE 06/04/2009  . Osteoporosis   . Fibromyalgia   . Stroke     Past Surgical History  Procedure Laterality Date  . Total abdominal hysterectomy      post op needed oxygen was told "she gave them a scare"  . Knee arthroscopy  2000    left  . Laparoscopic cholecystectomy  04-16-2010    [facility]  . Tubal ligation    . US echocardiography  11/20/2009    EF 55-60%  . Cardiovascular stress test  12/26/2004    EF 74%. NO EVIDENCE OF ISCHEMIA    There were no vitals filed for this visit.  Visit Diagnosis:  Muscle weakness of lower extremity  Abnormality of gait      Subjective Assessment - 02/19/15 0804    Subjective The patient  reports she is sore today from housework yesterday.  She has pain on both sides of knees (lateral aspect).     Currently in Pain? Yes   Pain Score 7    Pain Location Knee   Pain Orientation Lateral  bilateral knees   Pain Descriptors / Indicators Shooting;Aching   Pain Type Chronic pain   Pain Onset More than a month ago   Pain Frequency Constant   Aggravating Factors  doing too much   Pain Relieving Factors rest    Patient uses O2 today 2L (reports intermittent use based on weather/time of day). The patient has used a pool in the past for exercise.   THERAPEUTIC EXERCISE: Sidelying  IT band stretch (L with severe pain and tightness-sensitive to any light touch) Sidelying assisted hip abduction L and R sides x 5 reps with knee extended and tactile cues to avoid rolling with trunk Assisted straight leg raises R and L (L needs greater assist and raises 1-2 " only) x 5 reps L and 10 reps R L LE quad set x 5 reps with pillow under knee Sidelying hip abduction with knees bent (clamshell) x 5 reps each side  Gait: Gait activities ambulating on level surfaces with bilateral trendelenberg pattern noted.  Patient ambulated x 230 ft with cues on longer stride length and awareness of hip extension. Stair negotiation x 4 steps with reciprocal pattern and handrails.          PT Education - 02/19/15 [phone]    Education provided Yes   Education Details HEP: quad sets, straight leg raise (only R side able to do without assist), clam shells   Person(s) Educated Patient   Methods Explanation;Demonstration;Handout   Comprehension Returned demonstration;Verbalized understanding             PT Long Term Goals - 02/15/15 1230    PT LONG TERM GOAL #1   Title Pt will independently perform HEP for improved balance and strength. (Target 03/15/15)   Time 4   Period Weeks   Status New   PT LONG TERM GOAL #2   Title Pt will improve TUG score to less than or equal to 20 seconds for decreased  fall risk.   Time 4   Period Weeks   Status New   PT LONG TERM GOAL #3   Title Pt will improve Berg score to at least 43/56 for decreased fall risk.   Time 4   Period Weeks   Status New   PT LONG TERM GOAL #4   Title Pt will improve gait velocity to at least 2.62 ft/sec for improved gait efficiency and safety.   Time 4   Period Weeks   Status New   PT LONG TERM GOAL #5   Title Pt will verbalize plans for continued community fitness upon D/C from PT.   Time 4   Period Weeks   Status New               Plan - 02/19/15 1347    Clinical Impression Statement The patient reports significant lateral knee pain bilaterally.  During gait, she demonstrates bilateral trendelenburg/abductor weakness which may be contributing to poor mechanics and tightening of IT band.  The patient will benefit from strengthening for hip abduction and L hip flexion to improved hip stability during gait activities.     PT Next Visit Plan General HEP for LE strength, stretching (focus on IT band), balance, gait emphasizing hip stability during stance phase   Consulted and Agree with Plan of Care Patient        Problem List Patient Active Problem List   Diagnosis Date Noted  . Long-term (current) use of anticoagulants 09/26/2014  . PAF (paroxysmal atrial fibrillation) 09/19/2014  . Chronic respiratory failure with hypoxia 06/27/2014  . COPD exacerbation 06/27/2014  . Chest wall pain 04/06/2014  . Abnormal ECG 05/10/2013  . Atypical chest pain 05/04/2013  . Preoperative respiratory examination 05/02/2013  . Hyperglycemia, drug-induced 10/12/2011  . Encounter for long-term (current) use of medications 08/21/2011  . PAT (paroxysmal atrial tachycardia) 08/21/2011  . Right middle lobe pneumonia 07/24/2011  . Oral thrush 07/24/2011  . UPPER RESPIRATORY INFECTION, VIRAL 07/11/2010  . OTHER ACUTE SINUSITIS 06/04/2010  . PULMONARY NODULE, LEFT LOWER LOBE 10/14/2009  . PATENT FORAMEN OVALE 10/14/2009   . DEPRESSION 06/04/2009  . Essential hypertension 06/04/2009  . COPD (chronic obstructive pulmonary disease) 06/04/2009  . G E R D 06/04/2009  . SNORING, HX OF 06/04/2009    Luvena Wentling, PT 02/19/2015, 1:50 PM  [facility] [address] [name], [place], [address] Phone: [phone]   Fax:  [phone]

## 2015-02-22 ENCOUNTER — Ambulatory Visit: Payer: Medicare Other

## 2015-02-22 DIAGNOSIS — M6281 Muscle weakness (generalized): Secondary | ICD-10-CM

## 2015-02-22 DIAGNOSIS — R269 Unspecified abnormalities of gait and mobility: Secondary | ICD-10-CM | POA: Diagnosis not present

## 2015-02-22 NOTE — Therapy (Signed)
[place] [address] [place], [place], [address] Phone: [phone]   Fax:  [phone]  Physical Therapy Treatment  Patient Details  Name: Laurie Horton MRN: [id number] Date of Birth: [birth year] Referring Provider:  [name], MD  Encounter Date: 02/22/2015      PT End of Session - 02/22/15 1703    Visit Number 3   Number of Visits 9   Date for PT Re-Evaluation 04/14/15   PT Start Time 0805   PT Stop Time 0845   PT Time Calculation (min) 40 min      Past Medical History  Diagnosis Date  . Depression   . GERD (gastroesophageal reflux disease)   . HTN (hypertension)   . Hiatal hernia   . COPD (chronic obstructive pulmonary disease)   . Diverticulitis   . DVT (deep venous thrombosis)   . Obesity   . Hyperlipidemia   . PFO (patent foramen ovale)   . PAT (paroxysmal atrial tachycardia)   . Right middle lobe pneumonia 07/24/2011    First noted at admit 07/10/11. Persists on cxr 07/22/11. Cleared on CT 08/24/11. No further followup  . PULMONARY NODULE, LEFT LOWER LOBE 10/14/2009    17mm LLL nodule dec 2010. Stable and 56mm in Oct 2012. No further fu  . TOBACCO ABUSE 06/04/2009  . Osteoporosis   . Fibromyalgia   . Stroke     Past Surgical History  Procedure Laterality Date  . Total abdominal hysterectomy      post op needed oxygen was told "she gave them a scare"  . Knee arthroscopy  2000    left  . Laparoscopic cholecystectomy  04-16-2010    [facility]  . Tubal ligation    . US echocardiography  11/20/2009    EF 55-60%  . Cardiovascular stress test  12/26/2004    EF 74%. NO EVIDENCE OF ISCHEMIA    There were no vitals filed for this visit.  Visit Diagnosis:  Muscle weakness of lower extremity      Subjective Assessment - 02/22/15 1700    Subjective Pt reports she was performing her exercises 2x per day every day and that she believes they are making her hurt worse.   Currently in Pain? Yes   Pain Score 7    Pain Location Knee   Pain Orientation Left   Pain Descriptors / Indicators Aching   Pain Type Chronic pain   Pain Onset More than a month ago   Pain Frequency Constant   Aggravating Factors  exericse   Pain Relieving Factors rest        Therex: Taught and performed supine glut max stretch each leg with therapist assistance and therapist encouragement to apply gentle overpressure for increased stretch.  Taught and performed supine hip flexor stretch each leg x4 minutes.   *extra time required for these two stretches due to pt's c/o pain and initial lack of tolerance of any activity with lower extremities.  Self care: Remainder of session spent educating pt on anatomy of IT band and how this causes hip/knee pain, and how it can be effectively treated with stretching and strengthening. Also educated pt on the somatosensory aspect of chronic pain. In addition, pt was educated on Dr. Balinda Quails Sarno's theories regarding fibromyalgia and she was recommended to read his book or look up educational information based from his book on the Internet. Pt was also educated regarding the sensation of muscle stretch, and how this differs from pain  coming from a noxious stimulus as the stretch is not causing harm to her body. She was informed about the importance of recognizing when a sensation is "normal" versus "harmful" and trying to redefine her sensations with these words in effort to decrease cycle of chronic pain.                            PT Education - 02/22/15 1701    Education provided Yes   Education Details HEP to address it band pain; recommendation to read Jenny Reichmann E [employer] book and or look up his information regarding chronici pain/back pain/fibromyalgia on the internet   Person(s) Educated Patient   Methods Explanation;Demonstration;Handout   Comprehension Verbalized understanding;Returned demonstration             PT Long Term  Goals - 02/15/15 1230    PT LONG TERM GOAL #1   Title Pt will independently perform HEP for improved balance and strength. (Target 03/15/15)   Time 4   Period Weeks   Status New   PT LONG TERM GOAL #2   Title Pt will improve TUG score to less than or equal to 20 seconds for decreased fall risk.   Time 4   Period Weeks   Status New   PT LONG TERM GOAL #3   Title Pt will improve Berg score to at least 43/56 for decreased fall risk.   Time 4   Period Weeks   Status New   PT LONG TERM GOAL #4   Title Pt will improve gait velocity to at least 2.62 ft/sec for improved gait efficiency and safety.   Time 4   Period Weeks   Status New   PT LONG TERM GOAL #5   Title Pt will verbalize plans for continued community fitness upon D/C from PT.   Time 4   Period Weeks   Status New               Plan - 02/22/15 1703    Clinical Impression Statement Pt demonstrates notable apprehension with all movement of BLE both passive and active. In addition she reports all sensations in lower extremities, including gentle stretch as pain. Pt will benefit from addressing muscle tightness of the quads, hip flexors, glut max, weakness of the glut med and tension in the IT band to decrease physiological aspect of pain. In addition she will benefit form continued education regarding the psychological aspect of chronic pain and consciously redefining pain as she learns how to move in a pain free range.   PT Next Visit Plan Review HEP provided today for IT band, teach the IT band stretch (we didn't get to this one on 4/22). Ask if she looked into the Dr. Alyson Reedy information about chronic pain/fibromyalgia        Problem List Patient Active Problem List   Diagnosis Date Noted  . Long-term (current) use of anticoagulants 09/26/2014  . PAF (paroxysmal atrial fibrillation) 09/19/2014  . Chronic respiratory failure with hypoxia 06/27/2014  . COPD exacerbation 06/27/2014  . Chest wall pain 04/06/2014  .  Abnormal ECG 05/10/2013  . Atypical chest pain 05/04/2013  . Preoperative respiratory examination 05/02/2013  . Hyperglycemia, drug-induced 10/12/2011  . Encounter for long-term (current) use of medications 08/21/2011  . PAT (paroxysmal atrial tachycardia) 08/21/2011  . Right middle lobe pneumonia 07/24/2011  . Oral thrush 07/24/2011  . UPPER RESPIRATORY INFECTION, VIRAL 07/11/2010  . OTHER ACUTE SINUSITIS  06/04/2010  . PULMONARY NODULE, LEFT LOWER LOBE 10/14/2009  . PATENT FORAMEN OVALE 10/14/2009  . DEPRESSION 06/04/2009  . Essential hypertension 06/04/2009  . COPD (chronic obstructive pulmonary disease) 06/04/2009  . G E R D 06/04/2009  . SNORING, HX OF 06/04/2009   Delrae Sawyers, PT,DPT,NCS 02/22/2015 5:18 PM Phone [phone] FAX [phone]         [facility] [address] [place], [place], [address] Phone: [phone]   Fax:  [phone]

## 2015-02-22 NOTE — Patient Instructions (Signed)
Clam Shell 45 Degrees   Lying on your side with a 1/4th forward roll with hips and knees bent 45. Lift knee 3". Be sure pelvis does not roll backward. Do not arch back. Do 3 sets of 10 reps, each leg, every other day.  http://ss.exer.us/75   Copyright  VHI. All rights reserved.    IT Band: Wall Lean With Crossed Leg   Stand with left hand on wall (or hold onto a chair). Cross left leg behind other leg. Stretch hip toward wall with other arm supporting trunk. Hold 30 seconds. Relax. Repeat 3 times each leg. Do 2 sets per  Day. (morning and night) Repeat on other side.  Copyright  VHI. All rights reserved.   Hip Flexor Stretch   Lying on back near edge of bed, bend one leg, foot flat. Hang other leg over edge, relaxed, bend knee until a gentle stretch is felt. Keep foot supported with a stack of books or the floor. Hold 30 seconds.  Repeat 3 times on each leg. Do 2 sessions per day (morning and night). Advanced Exercise: Bend knee back keeping thigh in contact with bed.  http://gt2.exer.us/347   Copyright  VHI. All rights reserved.  Gluteal Stretch--don't use a ball    Lie supine, foot flat on bed and, other ankle crossed over knee (no ball necessary). Push your knee away from your body until a gentle stretch is felt. Peform 3x30 seconds on each leg morning and night. Copyright  VHI. All rights reserved.

## 2015-02-26 ENCOUNTER — Ambulatory Visit (INDEPENDENT_AMBULATORY_CARE_PROVIDER_SITE_OTHER): Payer: Medicare Other | Admitting: *Deleted

## 2015-02-26 ENCOUNTER — Encounter: Payer: Self-pay | Admitting: Cardiology

## 2015-02-26 ENCOUNTER — Ambulatory Visit (INDEPENDENT_AMBULATORY_CARE_PROVIDER_SITE_OTHER): Payer: Medicare Other | Admitting: Cardiology

## 2015-02-26 VITALS — BP 152/76 | HR 80 | Ht 64.0 in | Wt 190.3 lb

## 2015-02-26 DIAGNOSIS — I480 Paroxysmal atrial fibrillation: Secondary | ICD-10-CM | POA: Diagnosis not present

## 2015-02-26 DIAGNOSIS — Z7901 Long term (current) use of anticoagulants: Secondary | ICD-10-CM | POA: Diagnosis not present

## 2015-02-26 DIAGNOSIS — Z79899 Other long term (current) drug therapy: Secondary | ICD-10-CM

## 2015-02-26 DIAGNOSIS — J449 Chronic obstructive pulmonary disease, unspecified: Secondary | ICD-10-CM

## 2015-02-26 LAB — POCT INR: INR: 2

## 2015-02-26 MED ORDER — WARFARIN SODIUM 5 MG PO TABS: 5.0000 mg | ORAL_TABLET | Freq: Every day | ORAL | Status: DC

## 2015-02-26 NOTE — Patient Instructions (Signed)
Continue your current therapy  I will see you in 4 months  

## 2015-02-26 NOTE — Progress Notes (Signed)
[name] Date of Birth: 05-06-1946 Medical Horton #[id number]  History of Present Illness: Laurie Horton is seen for follow up atrial fibrillation. She has a history of  abnormal ECG, severe COPD, stroke after knee surgery in 2006. Evaluation at that time revealed a PFO by TEE. She was previously maintained on aspirin therapy. In December she was noted to have more paroxysmal Afib. She was anticoagulated and Toprol dose was increased to 25 mg. She wore an event monitor that showed infrequent runs of Afib. Since then she has done well until yesterday. Yesterday she was at a [employer] day for a grandchild. She states it was very hot and she had to walk a long way. Did not have oxygen with her and no water. Felt her heart go out of rhythm and it hurt. Prior to this she states she would have some irregularity when stressed or excited.   Current Outpatient Prescriptions  Medication Sig Dispense Refill  . albuterol (PROVENTIL) (2.5 MG/3ML) 0.083% nebulizer solution Take 2.5 mg by nebulization every 6 (six) hours as needed for wheezing or shortness of breath.     Marland Kitchen albuterol (VENTOLIN HFA) 108 (90 BASE) MCG/ACT inhaler Inhale 2 puffs into the lungs every 4 (four) hours as needed for wheezing. (Patient taking differently: Inhale 2 puffs into the lungs every 4 (four) hours as needed for wheezing (rescue inhaler). ) 8.5 Inhaler 0  . desvenlafaxine (PRISTIQ) 50 MG 24 hr tablet Take 1 tablet (50 mg total) by mouth daily. 90 tablet 0  . diazepam (VALIUM) 2 MG tablet Take 1 tablet (2 mg total) by mouth as needed for anxiety. 30 tablet 0  . HYDROcodone-acetaminophen (NORCO/VICODIN) 5-325 MG per tablet Take 1 tablet by mouth 2 (two) times daily.    Marland Kitchen ipratropium (ATROVENT) 0.02 % nebulizer solution Take 500 mcg by nebulization every 4 (four) hours as needed (thightness).     . metoprolol succinate (TOPROL-XL) 50 MG 24 hr tablet Take 1 tablet (50 mg total) by mouth daily. Take with or immediately following a  meal. 90 tablet 3  . montelukast (SINGULAIR) 10 MG tablet TAKE 1 TABLET AT BEDTIME 30 tablet 4  . NORVASC 5 MG tablet TAKE 1 TABLET BY MOUTH EVERY DAY. 30 tablet 2  . ondansetron (ZOFRAN-ODT) 4 MG disintegrating tablet Take 1 tablet (4 mg total) by mouth every 8 (eight) hours as needed for nausea or vomiting. 20 tablet 0  . oxybutynin (DITROPAN-XL) 10 MG 24 hr tablet Take 10 mg by mouth daily.  3  . pantoprazole (PROTONIX) 40 MG tablet Take 1 tablet by mouth daily.    . polyethylene glycol (MIRALAX / GLYCOLAX) packet Take 17 g by mouth daily as needed.     . pregabalin (LYRICA) 50 MG capsule take 2 capsules by oral route daily    . tiZANidine (ZANAFLEX) 4 MG tablet Take 1 mg by mouth as needed for muscle spasms.     . traZODone (DESYREL) 100 MG tablet Take 1 tablet (100 mg total) by mouth at bedtime. 90 tablet 0  . warfarin (COUMADIN) 5 MG tablet Take 1 tablet (5 mg total) by mouth daily. 30 tablet 2   No current facility-administered medications for this visit.    Allergies  Allergen Reactions  . Alprazolam     REACTION: stops breathing  . Budesonide-Formoterol Fumarate     bliusters  . Aciphex [Rabeprazole Sodium]     rash  . Ambien [Zolpidem]   . Avelox [Moxifloxacin Hcl In Nacl]   .  Esomeprazole Magnesium     REACTION: "bouncing off walls"  . Eszopiclone   . Iodine     REACTION: swelling in throat  . Loratadine     claritin D causes shaking  . Lotrimin [Clotrimazole]   . Lunesta [Eszopiclone]     REACTION: "slept for a week"  . Other     Glue from ekg/heart monitor leads  . Oxcarbazepine Other (See Comments)    Causes deep sleep and dizziness  . Pseudoephedrine Hcl Er   . Zolpidem Tartrate     REACTION: "slept for a week"  . Bextra [Valdecoxib] Rash  . Ceclor [Cefaclor] Rash  . Covera-Hs [Verapamil Hcl] Palpitations  . Dicyclomine Hcl Rash  . Tessalon Perles Rash    Past Medical History  Diagnosis Date  . Depression   . GERD (gastroesophageal reflux disease)     . HTN (hypertension)   . Hiatal hernia   . COPD (chronic obstructive pulmonary disease)   . Diverticulitis   . DVT (deep venous thrombosis)   . Obesity   . Hyperlipidemia   . PFO (patent foramen ovale)   . PAT (paroxysmal atrial tachycardia)   . Right middle lobe pneumonia 07/24/2011    First noted at admit 07/10/11. Persists on cxr 07/22/11. Cleared on CT 08/24/11. No further followup  . PULMONARY NODULE, LEFT LOWER LOBE 10/14/2009    31mm LLL nodule dec 2010. Stable and 70mm in Oct 2012. No further fu  . TOBACCO ABUSE 06/04/2009  . Osteoporosis   . Fibromyalgia   . Stroke     Past Surgical History  Procedure Laterality Date  . Total abdominal hysterectomy      post op needed oxygen was told "she gave them a scare"  . Knee arthroscopy  2000    left  . Laparoscopic cholecystectomy  04-16-2010    [facility]  . Tubal ligation    . US echocardiography  11/20/2009    EF 55-60%  . Cardiovascular stress test  12/26/2004    EF 74%. NO EVIDENCE OF ISCHEMIA    History  Smoking status  . Former Smoker  . Quit date: 06/02/2010  Smokeless tobacco  . Not on file    History  Alcohol Use No    Family History  Problem Relation Age of Onset  . Dementia Mother   . Diabetes Mother   . Alzheimer's disease Mother   . Heart attack Brother 60  . Schizophrenia Sister   . Diabetes Sister   . Tremor Sister   . Heart attack Father     Review of Systems: The review of systems is per the HPI.  All other systems were reviewed and are negative.  Physical Exam: BP 152/76 mmHg  Pulse 80  Ht 5\' 4"  (1.626 m)  Wt 190 lb 4.8 oz (86.32 kg)  BMI 32.65 kg/m2 Patient is obese, alert and in no acute distress.  Skin is warm and dry. Color is normal.  HEENT is unremarkable. Normocephalic/atraumatic. PERRL. Sclera are nonicteric. Neck is supple. No masses. No JVD. Lungs are clear. Cardiac exam shows an irregular rate and rhythm. No gallop or murmur.  Abdomen is soft. Extremities are without edema. Gait and  ROM are intact. No gross neurologic deficits noted.  Wt Readings from Last 3 Encounters:  02/26/15 190 lb 4.8 oz (86.32 kg)  12/20/14 178 lb (80.74 kg)  12/06/14 196 lb 3.2 oz (88.996 kg)    LABORATORY DATA/PROCEDURES:    Lab Results  Component Value Date   WBC  7.5 12/06/2014   HGB 13.7 12/06/2014   HCT 39.7 12/06/2014   PLT 217 12/06/2014   GLUCOSE 97 12/06/2014   CHOL 183 05/04/2013   TRIG 137 05/04/2013   HDL 37* 05/04/2013   LDLCALC 119* 05/04/2013   ALT 14 12/06/2014   AST 11 12/06/2014   NA 136 12/06/2014   K 4.5 12/06/2014   CL 98 12/06/2014   CREATININE 0.91 12/06/2014   BUN 19 12/06/2014   CO2 31 12/06/2014   TSH 2.82 10/15/2014   INR 2.0 02/26/2015   HGBA1C 5.8* 05/04/2013    BNP (last 3 results)  Recent Labs  09/19/14 1056  PROBNP 129.0*   Lexiscan Myoview Impression from July 2014 Exercise Capacity: [place] with no exercise. BP Response: Normal blood pressure response. Clinical Symptoms: Dizzy ECG Impression: No significant ST segment change suggestive of ischemia. Comparison with Prior Nuclear Study: No images to compare  Overall Impression: Normal stress nuclear study.  LV Ejection Fraction: 80%. LV Wall Motion: NL LV Function; NL Wall Motion  [place]  Event monitor tracings were personally reviewed in detail. She had copious recordings. The predominant rhythm is NSR with frequent PACs. She does have several short bursts of PAT and atrial fibrillation. The longest episode lasting 18 seconds. The episodes the patient recorded do not consistently correlate with AFib.    Echo 10/17/14: Study Conclusions  - Left ventricle: The cavity size was normal. There was mild concentric hypertrophy. Systolic function was normal. The estimated ejection fraction was in the range of 60% to 65%. Wall motion was normal; there were no regional wall motion abnormalities. Doppler parameters are consistent with abnormal left ventricular  relaxation (grade 1 diastolic dysfunction). - Aorta: Aortic root dimension: 44 mm (ED). - Ascending aorta: The ascending aorta was mildly dilated. - Left atrium: The atrium was mildly dilated.  Assessment / Plan: 1. PAF -by exam appears to be in Afib with controlled rate.   Her symptoms  of chest pain are quite atypical. Probably related to stressors yesterday. Avoid use of beta agonists and caffeine. INR 2.0 today. Recommend continuing current therapy. By her history she will convert on her own. If more frequent episodes we may have to consider antiarrhythmic therapy probably with Flecainide. Will monitor for now.   2. PFO - on anticoagulation. Stay off ASA.  3. COPD  4. Hypertension -  Fair control.  5. Aortic root enlargement. 44 mm by Echo. Recommend follow up in December.

## 2015-02-27 ENCOUNTER — Ambulatory Visit: Payer: Medicare Other | Admitting: Physical Therapy

## 2015-03-05 ENCOUNTER — Ambulatory Visit: Payer: Medicare Other | Attending: Emergency Medicine | Admitting: Physical Therapy

## 2015-03-05 DIAGNOSIS — R269 Unspecified abnormalities of gait and mobility: Secondary | ICD-10-CM | POA: Diagnosis present

## 2015-03-05 DIAGNOSIS — M79605 Pain in left leg: Secondary | ICD-10-CM

## 2015-03-05 DIAGNOSIS — M6281 Muscle weakness (generalized): Secondary | ICD-10-CM | POA: Insufficient documentation

## 2015-03-06 ENCOUNTER — Encounter: Payer: Self-pay | Admitting: Physical Therapy

## 2015-03-06 ENCOUNTER — Ambulatory Visit: Payer: Medicare Other | Admitting: Physical Therapy

## 2015-03-06 NOTE — Therapy (Signed)
[facility] [address] [place], [place], [address] Phone: [phone]   Fax:  [phone]  Physical Therapy Treatment  Patient Details  Name: Laurie Horton MRN: [id number] Date of Birth: 1946-03-05 Referring Provider:  [name], MD  Encounter Date: 03/05/2015      PT End of Session - 03/06/15 1001    Visit Number 4  G4   Number of Visits 9   Date for PT Re-Evaluation 04/14/15   PT Start Time 0755   PT Stop Time 0845   PT Time Calculation (min) 50 min      Past Medical History  Diagnosis Date  . Depression   . GERD (gastroesophageal reflux disease)   . HTN (hypertension)   . Hiatal hernia   . COPD (chronic obstructive pulmonary disease)   . Diverticulitis   . DVT (deep venous thrombosis)   . Obesity   . Hyperlipidemia   . PFO (patent foramen ovale)   . PAT (paroxysmal atrial tachycardia)   . Right middle lobe pneumonia 07/24/2011    First noted at admit 07/10/11. Persists on cxr 07/22/11. Cleared on CT 08/24/11. No further followup  . PULMONARY NODULE, LEFT LOWER LOBE 10/14/2009    9mm LLL nodule dec 2010. Stable and 74mm in Oct 2012. No further fu  . TOBACCO ABUSE 06/04/2009  . Osteoporosis   . Fibromyalgia   . Stroke     Past Surgical History  Procedure Laterality Date  . Total abdominal hysterectomy      post op needed oxygen was told "she gave them a scare"  . Knee arthroscopy  2000    left  . Laparoscopic cholecystectomy  04-16-2010    [facility]  . Tubal ligation    . US echocardiography  11/20/2009    EF 55-60%  . Cardiovascular stress test  12/26/2004    EF 74%. NO EVIDENCE OF ISCHEMIA    There were no vitals filed for this visit.  Visit Diagnosis:  Muscle weakness of lower extremity  Leg pain, diffuse, left      Subjective Assessment - 03/06/15 1013    Subjective Pt. reports she pulled a muscle in her right leg doing hip flexor stretch off the side of her bed; has not looked into  Dr. Harold Hedge info as instructed to do so by PT seen in previous session  unable to come last week due to being in A-fib   Pertinent History History of L TKR 2010 with CVA following surgery; pt reported L3-5 "collapsed"   Patient Stated Goals Pt's goal for therapy is to have less pain when walking, when walking her dog.                         [facility] Adult PT Treatment/Exercise - 03/06/15 0001    Lumbar Exercises: Stretches   Active Hamstring Stretch 1 rep;30 seconds  bil. LE's - runner's stretch in standing   Lower Trunk Rotation 2 reps;20 seconds  for left side   ITB Stretch 3 reps;30 seconds  in right sidelying position   ITB Stretch Limitations pain with deep palpation and soft tissue mobilization just above left knee   Piriformis Stretch 1 rep;20 seconds  for L hip   Lumbar Exercises: Supine   Clam 10 reps  bil. LE's- 2# weight used   Bridge 5 reps   Straight Leg Raise 10 reps  bul. LE's   Lumbar Exercises: Sidelying   Hip Abduction 10  reps  bil. LE's     TherEx:  Bridging with LLE x 3 reps only due to c/o R hamstring cramping added L ITB stretch in standing - leaning on wall - to HEP as instructed per plan; pt. Instructed to add to HEP - Performed 3 reps 30 sec hold for LLE ITB stretching Soft tissue mobilization to left lateral distal thigh for ITB tightness with deep pressure and tissue palpation  Pt required frequent rest breaks during session due to c/o hamstring cramping and c/o pain in low back and legs- Stated she was unable to take pain meds prior to PT session due to having to drive to clinic         PT Education - 03/06/15 0959    Education provided Yes   Education Details added ITB for LLE in standing as planned by PT seen in previous session - added this stretch to HEP per PT plan    Person(s) Educated Patient   Methods Explanation;Demonstration;Handout   Comprehension Verbalized understanding;Returned demonstration             PT  Long Term Goals - 02/15/15 1230    PT LONG TERM GOAL #1   Title Pt will independently perform HEP for improved balance and strength. (Target 03/15/15)   Time 4   Period Weeks   Status New   PT LONG TERM GOAL #2   Title Pt will improve TUG score to less than or equal to 20 seconds for decreased fall risk.   Time 4   Period Weeks   Status New   PT LONG TERM GOAL #3   Title Pt will improve Berg score to at least 43/56 for decreased fall risk.   Time 4   Period Weeks   Status New   PT LONG TERM GOAL #4   Title Pt will improve gait velocity to at least 2.62 ft/sec for improved gait efficiency and safety.   Time 4   Period Weeks   Status New   PT LONG TERM GOAL #5   Title Pt will verbalize plans for continued community fitness upon D/C from PT.   Time 4   Period Weeks   Status New               Plan - 03/06/15 1014    Clinical Impression Statement Mobility limitted by c/o pain - reports pain mostly all over due to fibromyalgia   Pt will benefit from skilled therapeutic intervention in order to improve on the following deficits Abnormal gait;Decreased activity tolerance;Decreased balance;Decreased mobility;Decreased strength;Difficulty walking;Pain   Rehab Potential Good   Clinical Impairments Affecting Rehab Potential Longstanding history of pain; patient is painful to touch during MMT   PT Frequency 2x / week   PT Duration 4 weeks   PT Treatment/Interventions ADLs/Self Care Home Management;Functional mobility training;Gait training;Therapeutic activities;Therapeutic exercise;Balance training;Neuromuscular re-education;Patient/family education   PT Next Visit Plan cont ther ex   Consulted and Agree with Plan of Care Patient        Problem List Patient Active Problem List   Diagnosis Date Noted  . Long-term (current) use of anticoagulants 09/26/2014  . PAF (paroxysmal atrial fibrillation) 09/19/2014  . Chronic respiratory failure with hypoxia 06/27/2014  . COPD  exacerbation 06/27/2014  . Chest wall pain 04/06/2014  . Abnormal ECG 05/10/2013  . Atypical chest pain 05/04/2013  . Preoperative respiratory examination 05/02/2013  . Hyperglycemia, drug-induced 10/12/2011  . Encounter for long-term (current) use of medications 08/21/2011  . PAT (paroxysmal  atrial tachycardia) 08/21/2011  . Right middle lobe pneumonia 07/24/2011  . Oral thrush 07/24/2011  . UPPER RESPIRATORY INFECTION, VIRAL 07/11/2010  . OTHER ACUTE SINUSITIS 06/04/2010  . PULMONARY NODULE, LEFT LOWER LOBE 10/14/2009  . PATENT FORAMEN OVALE 10/14/2009  . DEPRESSION 06/04/2009  . Essential hypertension 06/04/2009  . COPD (chronic obstructive pulmonary disease) 06/04/2009  . G E R D 06/04/2009  . SNORING, HX OF 06/04/2009    Alda Lea, PT 03/06/2015, 10:16 AM  [facility] [address] [place], [place], [address] Phone: [phone]   Fax:  [phone]

## 2015-03-08 ENCOUNTER — Ambulatory Visit: Payer: Medicare Other | Admitting: Physical Therapy

## 2015-03-08 DIAGNOSIS — M79605 Pain in left leg: Secondary | ICD-10-CM

## 2015-03-08 DIAGNOSIS — R269 Unspecified abnormalities of gait and mobility: Secondary | ICD-10-CM

## 2015-03-08 DIAGNOSIS — M6281 Muscle weakness (generalized): Secondary | ICD-10-CM

## 2015-03-08 NOTE — Therapy (Signed)
[facility] [address] [place], [place], [address] Phone: [phone]   Fax:  [phone]  Physical Therapy Treatment  Patient Details  Name: Laurie Horton MRN: [id number] Date of Birth: 1946-03-29 Referring Provider:  [name], MD  Encounter Date: 03/08/2015      PT End of Session - 03/08/15 1223    Visit Number 5   Number of Visits 9   Date for PT Re-Evaluation 04/14/15   Authorization Type G-code every 10th visit   PT Start Time 0930   PT Stop Time 1015   PT Time Calculation (min) 45 min   Activity Tolerance Patient limited by pain   Behavior During Therapy Anxious  tearful/painful to light touch with therapy      Past Medical History  Diagnosis Date  . Depression   . GERD (gastroesophageal reflux disease)   . HTN (hypertension)   . Hiatal hernia   . COPD (chronic obstructive pulmonary disease)   . Diverticulitis   . DVT (deep venous thrombosis)   . Obesity   . Hyperlipidemia   . PFO (patent foramen ovale)   . PAT (paroxysmal atrial tachycardia)   . Right middle lobe pneumonia 07/24/2011    First noted at admit 07/10/11. Persists on cxr 07/22/11. Cleared on CT 08/24/11. No further followup  . PULMONARY NODULE, LEFT LOWER LOBE 10/14/2009    5mm LLL nodule dec 2010. Stable and 4mm in Oct 2012. No further fu  . TOBACCO ABUSE 06/04/2009  . Osteoporosis   . Fibromyalgia   . Stroke     Past Surgical History  Procedure Laterality Date  . Total abdominal hysterectomy      post op needed oxygen was told "she gave them a scare"  . Knee arthroscopy  2000    left  . Laparoscopic cholecystectomy  04-16-2010    [facility]  . Tubal ligation    . US echocardiography  11/20/2009    EF 55-60%  . Cardiovascular stress test  12/26/2004    EF 74%. NO EVIDENCE OF ISCHEMIA    There were no vitals filed for this visit.  Visit Diagnosis:  Muscle weakness of lower extremity  Leg pain, diffuse,  left  Abnormality of gait      Subjective Assessment - 03/08/15 0931    Subjective Pt feels that she overdid the exercise for the left leg.  She reports increased pain to touch along L knee cap, with increased swelling.   Currently in Pain? Yes   Pain Score 10-Worst pain ever   Pain Location Leg  L IT band, L knee cap   Pain Orientation Left   Pain Descriptors / Indicators Burning   Pain Type Acute pain   Pain Onset In the past 7 days   Pain Frequency Constant   Aggravating Factors  over doing stretch exercise on side (IT band)   Pain Relieving Factors some of the exercises are helping      Pt describes that she has been doing the recommended exercises at least twice per day, with exception of standing IT band stretch, which pt reports "did something to my back."  PT recommends patient discontinue standing IT band stretch.  Pt mentions several times during session that she exercises probably too much, past the point of pain.  PT discusses the principles of the exercises she has been given are to gently stretch, to strengthen hip abductor muscles and to attempt to decrease pain.   PT also mentioned that  the exercises she has been given are basic hip exercises, which when performed correctly, should not increase pain.  Pt is tender to palpation (pt tears up when PT lightly touches/palpates) along lateral aspect of L thigh.  PT performs light massage with heel of hand as well as cross-friction massage along IT band, and pt is especially tender along mid-lateral aspect.  PT discusses and trials ice massage along IT band (pt reports she prefers ice to heat).  PT performs ice massage with pt in sidelying with pillow between knees for 3 minutes, then pt performs self-ice massage in sitting position for 1 minute.  Pt leans strongly over to the right and keeps L knee leg held in the air, with PT giving cues for more neutral, relaxed posture.                           PT  Education - 03/08/15 1223    Education provided Yes   Education Details ice massage to IT band; discussion about safety and proper technique of exercises/avoid over-performance into pain with exercises   Person(s) Educated Patient   Methods Explanation;Handout;Demonstration   Comprehension Verbalized understanding;Returned demonstration             PT Long Term Goals - 02/15/15 1230    PT LONG TERM GOAL #1   Title Pt Horton independently perform HEP for improved balance and strength. (Target 03/15/15)   Time 4   Period Weeks   Status New   PT LONG TERM GOAL #2   Title Pt Horton improve TUG score to less than or equal to 20 seconds for decreased fall risk.   Time 4   Period Weeks   Status New   PT LONG TERM GOAL #3   Title Pt Horton improve Berg score to at least 43/56 for decreased fall risk.   Time 4   Period Weeks   Status New   PT LONG TERM GOAL #4   Title Pt Horton improve gait velocity to at least 2.62 ft/sec for improved gait efficiency and safety.   Time 4   Period Weeks   Status New   PT LONG TERM GOAL #5   Title Pt Horton verbalize plans for continued community fitness upon D/C from PT.   Time 4   Period Weeks   Status New               Plan - 03/08/15 1224    Clinical Impression Statement Moiblity and exercise limited by pain, though pt may be pushing too hard with exercises/not performing correctly at home.  Had discussion iwth patient about this to avoid excess pain.  Plan to add modalities/massage to plan of care .   Pt Horton benefit from skilled therapeutic intervention in order to improve on the following deficits Abnormal gait;Decreased activity tolerance;Decreased balance;Decreased mobility;Decreased strength;Difficulty walking;Pain;Improper body mechanics   Rehab Potential Good   Clinical Impairments Affecting Rehab Potential Longstanding history of pain; patient is painful to touch during MMT   PT Frequency 2x / week   PT Duration 4 weeks   PT  Treatment/Interventions ADLs/Self Care Home Management;Functional mobility training;Gait training;Therapeutic activities;Therapeutic exercise;Balance training;Neuromuscular re-education;Patient/family education;Cryotherapy;Manual techniques   PT Next Visit Plan ask about ice massage; try to add some standing balance exercises if able    Consulted and Agree with Plan of Care Patient        Problem List Patient Active Problem List   Diagnosis Date Noted  .  Long-term (current) use of anticoagulants 09/26/2014  . PAF (paroxysmal atrial fibrillation) 09/19/2014  . Chronic respiratory failure with hypoxia 06/27/2014  . COPD exacerbation 06/27/2014  . Chest wall pain 04/06/2014  . Abnormal ECG 05/10/2013  . Atypical chest pain 05/04/2013  . Preoperative respiratory examination 05/02/2013  . Hyperglycemia, drug-induced 10/12/2011  . Encounter for long-term (current) use of medications 08/21/2011  . PAT (paroxysmal atrial tachycardia) 08/21/2011  . Right middle lobe pneumonia 07/24/2011  . Oral thrush 07/24/2011  . UPPER RESPIRATORY INFECTION, VIRAL 07/11/2010  . OTHER ACUTE SINUSITIS 06/04/2010  . PULMONARY NODULE, LEFT LOWER LOBE 10/14/2009  . PATENT FORAMEN OVALE 10/14/2009  . DEPRESSION 06/04/2009  . Essential hypertension 06/04/2009  . COPD (chronic obstructive pulmonary disease) 06/04/2009  . G E R D 06/04/2009  . SNORING, HX OF 06/04/2009    Gaia Gullikson W. 03/08/2015, 12:28 PM  Gean Maidens., PT  [facility] [address] [place], [place], [address] Phone: [phone]   Fax:  [phone]

## 2015-03-08 NOTE — Patient Instructions (Signed)
Ice Massage:  Fill styrofoam cups about 1/2 full with water and then freeze.    Once frozen, peel away the styrofoam until the ice is exposed.  In a sitting position, with your body relaxed, gently massage the ice along your iliotibial band (running along the side of your leg from your hip to the outer portion of your knee).  You can massage this area for up to 5 minutes.  Make sure to have a towel nearby the area being massaged, as the ice will melt and drip.  Try to do this once-twice per day if you can tolerate this.  If for some reason this generates pain, please stop and do this again until you speak to your therapist.

## 2015-03-11 ENCOUNTER — Ambulatory Visit: Payer: Medicare Other

## 2015-03-11 DIAGNOSIS — R269 Unspecified abnormalities of gait and mobility: Secondary | ICD-10-CM

## 2015-03-11 NOTE — Therapy (Signed)
[facility] [address] [place], [place], [address] Phone: [phone]   Fax:  [phone]  Physical Therapy Treatment  Patient Details  Name: Laurie Horton MRN: [id number] Date of Birth: [birth year] Referring Provider:  [name], MD  Encounter Date: 03/11/2015      PT End of Session - 03/11/15 0843    Visit Number 6   Number of Visits 9   Date for PT Re-Evaluation 04/14/15   Authorization Type G-code every 10th visit   PT Start Time 0803   PT Stop Time 0843   PT Time Calculation (min) 40 min      Past Medical History  Diagnosis Date  . Depression   . GERD (gastroesophageal reflux disease)   . HTN (hypertension)   . Hiatal hernia   . COPD (chronic obstructive pulmonary disease)   . Diverticulitis   . DVT (deep venous thrombosis)   . Obesity   . Hyperlipidemia   . PFO (patent foramen ovale)   . PAT (paroxysmal atrial tachycardia)   . Right middle lobe pneumonia 07/24/2011    First noted at admit 07/10/11. Persists on cxr 07/22/11. Cleared on CT 08/24/11. No further followup  . PULMONARY NODULE, LEFT LOWER LOBE 10/14/2009    59mm LLL nodule dec 2010. Stable and 27mm in Oct 2012. No further fu  . TOBACCO ABUSE 06/04/2009  . Osteoporosis   . Fibromyalgia   . Stroke     Past Surgical History  Procedure Laterality Date  . Total abdominal hysterectomy      post op needed oxygen was told "she gave them a scare"  . Knee arthroscopy  2000    left  . Laparoscopic cholecystectomy  04-16-2010    [facility]  . Tubal ligation    . US echocardiography  11/20/2009    EF 55-60%  . Cardiovascular stress test  12/26/2004    EF 74%. NO EVIDENCE OF ISCHEMIA    There were no vitals filed for this visit.  Visit Diagnosis:  Abnormality of gait      Subjective Assessment - 03/11/15 0806    Subjective Pt reports she had a great weekend. She used ice massage every day since Friday. She feels this was beneficial.    Currently in Pain? Yes   Pain Score 7    Pain Location --  whole body  have pain every day   Aggravating Factors  the weather. Damp/rainy (although it isn't raining today)       3 laps at counter each: -high knee marching with 2 second holds forward/backward -forward backward walking with eyes closed and MIN A -braiding x 3 laps left and right -heel walking forward/backward -toe walking forward/backward -90 degree turns leading with head. For a total of 3 full turns each direction  Corner balance on two stacked pillows: 10x head turns with eyes closed horizontal, vertical, each diagonal  First with fingertip support then without support with MIN A                                PT Long Term Goals - 02/15/15 1230    PT LONG TERM GOAL #1   Title Pt will independently perform HEP for improved balance and strength. (Target 03/15/15)   Time 4   Period Weeks   Status New   PT LONG TERM GOAL #2   Title Pt will improve TUG score to less than  or equal to 20 seconds for decreased fall risk.   Time 4   Period Weeks   Status New   PT LONG TERM GOAL #3   Title Pt will improve Berg score to at least 43/56 for decreased fall risk.   Time 4   Period Weeks   Status New   PT LONG TERM GOAL #4   Title Pt will improve gait velocity to at least 2.62 ft/sec for improved gait efficiency and safety.   Time 4   Period Weeks   Status New   PT LONG TERM GOAL #5   Title Pt will verbalize plans for continued community fitness upon D/C from PT.   Time 4   Period Weeks   Status New               Plan - 03/11/15 9021    Clinical Impression Statement Pt tolerated treatment much better today with the emphasis on balance rather than leg flexibility. Pt would benefit from gaze stabilization exercises but when we attempted to do these today she was unable to understand or coordinate the exercise. Continue with balance training on compliant surfaces with eyes closed to  stimulate the vestibular system as she reports loss of balance when washing hair in the shower.   PT Next Visit Plan Check STGs. balance with eyes closed on compliant surface        Problem List Patient Active Problem List   Diagnosis Date Noted  . Long-term (current) use of anticoagulants 09/26/2014  . PAF (paroxysmal atrial fibrillation) 09/19/2014  . Chronic respiratory failure with hypoxia 06/27/2014  . COPD exacerbation 06/27/2014  . Chest wall pain 04/06/2014  . Abnormal ECG 05/10/2013  . Atypical chest pain 05/04/2013  . Preoperative respiratory examination 05/02/2013  . Hyperglycemia, drug-induced 10/12/2011  . Encounter for long-term (current) use of medications 08/21/2011  . PAT (paroxysmal atrial tachycardia) 08/21/2011  . Right middle lobe pneumonia 07/24/2011  . Oral thrush 07/24/2011  . UPPER RESPIRATORY INFECTION, VIRAL 07/11/2010  . OTHER ACUTE SINUSITIS 06/04/2010  . PULMONARY NODULE, LEFT LOWER LOBE 10/14/2009  . PATENT FORAMEN OVALE 10/14/2009  . DEPRESSION 06/04/2009  . Essential hypertension 06/04/2009  . COPD (chronic obstructive pulmonary disease) 06/04/2009  . G E R D 06/04/2009  . SNORING, HX OF 06/04/2009    Delrae Sawyers, PT,DPT,NCS 03/11/2015 8:46 AM Phone [phone] FAX [phone]         [facility] [address] [place], [place], [address] Phone: [phone]   Fax:  [phone]

## 2015-03-13 ENCOUNTER — Ambulatory Visit: Payer: Medicare Other | Admitting: Physical Therapy

## 2015-03-13 DIAGNOSIS — R269 Unspecified abnormalities of gait and mobility: Secondary | ICD-10-CM | POA: Diagnosis not present

## 2015-03-13 NOTE — Therapy (Signed)
[facility] [address] [place], [place], [address] Phone: [phone]   Fax:  [phone]  Physical Therapy Treatment  Patient Details  Name: Laurie Horton MRN: [id number] Date of Birth: 1945-11-15 Referring Provider:  [name], MD  Encounter Date: 03/13/2015      PT End of Session - 03/14/15 1257    Visit Number 7   Authorization Type G-code every 10th visit   PT Start Time 0803   PT Stop Time 0844   PT Time Calculation (min) 41 min   Activity Tolerance Patient tolerated treatment well   Behavior During Therapy [facility] for tasks assessed/performed      Past Medical History  Diagnosis Date  . Depression   . GERD (gastroesophageal reflux disease)   . HTN (hypertension)   . Hiatal hernia   . COPD (chronic obstructive pulmonary disease)   . Diverticulitis   . DVT (deep venous thrombosis)   . Obesity   . Hyperlipidemia   . PFO (patent foramen ovale)   . PAT (paroxysmal atrial tachycardia)   . Right middle lobe pneumonia 07/24/2011    First noted at admit 07/10/11. Persists on cxr 07/22/11. Cleared on CT 08/24/11. No further followup  . PULMONARY NODULE, LEFT LOWER LOBE 10/14/2009    5mm LLL nodule dec 2010. Stable and 4mm in Oct 2012. No further fu  . TOBACCO ABUSE 06/04/2009  . Osteoporosis   . Fibromyalgia   . Stroke     Past Surgical History  Procedure Laterality Date  . Total abdominal hysterectomy      post op needed oxygen was told "she gave them a scare"  . Knee arthroscopy  2000    left  . Laparoscopic cholecystectomy  04-16-2010    [facility]  . Tubal ligation    . US echocardiography  11/20/2009    EF 55-60%  . Cardiovascular stress test  12/26/2004    EF 74%. NO EVIDENCE OF ISCHEMIA    There were no vitals filed for this visit.  Visit Diagnosis:  Abnormality of gait      Subjective Assessment - 03/13/15 0804    Subjective The ice has really helped a lot.  I can sit and watch TV and  ice the IT band area.  It is really helpful.  No falls in the past several weeks.   Pain Score 7    Pain Location --  whole body aches all over   Pain Frequency Constant   Aggravating Factors  over-exertion aggravates pain   Pain Relieving Factors ice massage helps pain            [facility] PT Assessment - 03/13/15 0811    Ambulation/Gait   Gait velocity 11.51 sec=2.85 ft/sec   Berg Balance Test   Sit to Stand Able to stand without using hands and stabilize independently   Standing Unsupported Able to stand safely 2 minutes   Sitting with Back Unsupported but Feet Supported on Floor or Stool Able to sit safely and securely 2 minutes   Stand to Sit Sits safely with minimal use of hands   Transfers Able to transfer safely, definite need of hands   Standing Unsupported with Eyes Closed Able to stand 10 seconds safely   Standing Ubsupported with Feet Together Able to place feet together independently and stand 1 minute safely   From Standing, Reach Forward with Outstretched Arm Can reach confidently >25 cm (10")   From Standing Position, Pick up Object from Floor  Able to pick up shoe safely and easily   From Standing Position, Turn to Look Behind Over each Shoulder Looks behind from both sides and weight shifts well   Turn 360 Degrees Able to turn 360 degrees safely but slowly   Standing Unsupported, Alternately Place Feet on Step/Stool Able to stand independently and safely and complete 8 steps in 20 seconds   Standing Unsupported, One Foot in Front Able to plae foot ahead of the other independently and hold 30 seconds   Standing on One Leg Tries to lift leg/unable to hold 3 seconds but remains standing independently   Total Score 49   Timed Up and Go Test   TUG Normal TUG   Normal TUG (seconds) 13.84      At counter:   -Forward/back walking 2 reps x 10 ft, sidestepping 10 ft and marching in place x 8 reps -Heel/toe raises x 10 reps  Corner Balance exercises:  Standing on 2  pillows: -Feet together with eyes open-head turns x 10, head nods x 10, eyes closed with head turns, nods, and diagonals with intermittent UE support with chair in front.     Self Care: Discussed progress with goals-despite pain, pt feels that she is moving better-she reports doing the exercises and utilizing ice massage with improved results of less pain.  She is performing turning technique instructed by therapist in previous visit and she is performing some of the corner balance exercises at home (she does not want formal instructions, as she does not want to be overwhelmed with exercises).                    PT Education - 03/14/15 1256    Education provided Yes   Education Details Benefit to continued performance of HEP, ice massage as needed; POC/goal check   Person(s) Educated Patient   Methods Explanation   Comprehension Verbalized understanding             PT Long Term Goals - 03/14/15 1259    PT LONG TERM GOAL #1   Title Pt will independently perform HEP for improved balance and strength. (Target 03/15/15)   Status Achieved   PT LONG TERM GOAL #2   Title Pt will improve TUG score to less than or equal to 20 seconds for decreased fall risk.   Baseline TUG 13.84 seconds 03/13/15   Status Achieved   PT LONG TERM GOAL #3   Title Pt will improve Berg score to at least 43/56 for decreased fall risk.   Baseline Berg 49/56-5/11/16   Status Achieved   PT LONG TERM GOAL #4   Title Pt will improve gait velocity to at least 2.62 ft/sec for improved gait efficiency and safety.   Baseline 2.85 ft/sec-03/13/15   Status Achieved   PT LONG TERM GOAL #5   Title Pt will verbalize plans for continued community fitness upon D/C from PT.   Status Achieved               Plan - 03/14/15 1258    Clinical Impression Statement Pt presents to PT today with less pain complaints and feels that she is ready for discharge.  She is able to perform corner balance exercises  without loss of balance today.  Pt is appropriate for D/C from PT today.   Pt will benefit from skilled therapeutic intervention in order to improve on the following deficits Abnormal gait;Decreased activity tolerance;Decreased balance;Decreased mobility;Decreased strength;Difficulty walking;Pain;Improper body mechanics   Rehab Potential  Good   PT Treatment/Interventions ADLs/Self Care Home Management;Functional mobility training;Gait training;Therapeutic activities;Therapeutic exercise;Balance training;Neuromuscular re-education;Patient/family education;Cryotherapy;Manual techniques   PT Next Visit Plan Discharge PT this visit.   Consulted and Agree with Plan of Care Patient          G-Codes - 2015-03-17 1300    Functional Assessment Tool Used Berg 49/56, TUG 13.84 seconds, gait velocity 2.85 ft/sec   Functional Limitation Mobility: Walking and moving around   Mobility: Walking and Moving Around Goal Status [phone]) At least 20 percent but less than 40 percent impaired, limited or restricted   Mobility: Walking and Moving Around Discharge Status [phone]) At least 20 percent but less than 40 percent impaired, limited or restricted     PHYSICAL THERAPY DISCHARGE SUMMARY  Visits from Start of Care: 7   Current functional level related to goals / functional outcomes:     PT Long Term Goals - 17-Mar-2015 1259    PT LONG TERM GOAL #1   Title Pt will independently perform HEP for improved balance and strength. (Target 03/15/15)   Status Achieved   PT LONG TERM GOAL #2   Title Pt will improve TUG score to less than or equal to 20 seconds for decreased fall risk.   Baseline TUG 13.84 seconds 03/13/15   Status Achieved   PT LONG TERM GOAL #3   Title Pt will improve Berg score to at least 43/56 for decreased fall risk.   Baseline Berg 49/56-5/11/16   Status Achieved   PT LONG TERM GOAL #4   Title Pt will improve gait velocity to at least 2.62 ft/sec for improved gait efficiency and safety.    Baseline 2.85 ft/sec-03/13/15   Status Achieved   PT LONG TERM GOAL #5   Title Pt will verbalize plans for continued community fitness upon D/C from PT.   Status Achieved       Remaining deficits: Pain, balance   Education / Equipment: Pt has been educated in HEP for stretching, strengthening, balance, and ice massage for pain control along IT band.  Plan: Patient agrees to discharge.  Patient goals were partially met. Patient is being discharged due to meeting the stated rehab goals.  ?????      Problem List Patient Active Problem List   Diagnosis Date Noted  . Long-term (current) use of anticoagulants 09/26/2014  . PAF (paroxysmal atrial fibrillation) 09/19/2014  . Chronic respiratory failure with hypoxia 06/27/2014  . COPD exacerbation 06/27/2014  . Chest wall pain 04/06/2014  . Abnormal ECG 05/10/2013  . Atypical chest pain 05/04/2013  . Preoperative respiratory examination 05/02/2013  . Hyperglycemia, drug-induced 10/12/2011  . Encounter for long-term (current) use of medications 08/21/2011  . PAT (paroxysmal atrial tachycardia) 08/21/2011  . Right middle lobe pneumonia 07/24/2011  . Oral thrush 07/24/2011  . UPPER RESPIRATORY INFECTION, VIRAL 07/11/2010  . OTHER ACUTE SINUSITIS 06/04/2010  . PULMONARY NODULE, LEFT LOWER LOBE 10/14/2009  . PATENT FORAMEN OVALE 10/14/2009  . DEPRESSION 06/04/2009  . Essential hypertension 06/04/2009  . COPD (chronic obstructive pulmonary disease) 06/04/2009  . G E R D 06/04/2009  . SNORING, HX OF 06/04/2009    Jesly Hartmann W. 03/17/15, 1:02 PM  Gean Maidens., PT  [facility] [address] [place], [place], [address] Phone: [phone]   Fax:  [phone]

## 2015-03-14 DIAGNOSIS — R269 Unspecified abnormalities of gait and mobility: Secondary | ICD-10-CM | POA: Diagnosis not present

## 2015-03-20 ENCOUNTER — Ambulatory Visit (INDEPENDENT_AMBULATORY_CARE_PROVIDER_SITE_OTHER): Payer: Medicare Other | Admitting: Psychiatry

## 2015-03-20 ENCOUNTER — Telehealth (HOSPITAL_COMMUNITY): Payer: Self-pay | Admitting: *Deleted

## 2015-03-20 ENCOUNTER — Encounter (HOSPITAL_COMMUNITY): Payer: Self-pay | Admitting: Psychiatry

## 2015-03-20 VITALS — BP 130/80 | HR 74 | Ht 64.0 in | Wt 195.6 lb

## 2015-03-20 DIAGNOSIS — F0630 Mood disorder due to known physiological condition, unspecified: Secondary | ICD-10-CM | POA: Diagnosis not present

## 2015-03-20 DIAGNOSIS — F329 Major depressive disorder, single episode, unspecified: Secondary | ICD-10-CM

## 2015-03-20 DIAGNOSIS — F419 Anxiety disorder, unspecified: Secondary | ICD-10-CM

## 2015-03-20 DIAGNOSIS — F4310 Post-traumatic stress disorder, unspecified: Secondary | ICD-10-CM

## 2015-03-20 MED ORDER — TRAZODONE HCL 100 MG PO TABS: 100.0000 mg | ORAL_TABLET | Freq: Every day | ORAL | Status: DC

## 2015-03-20 MED ORDER — DESVENLAFAXINE SUCCINATE ER 50 MG PO TB24: 50.0000 mg | ORAL_TABLET | Freq: Every day | ORAL | Status: DC

## 2015-03-20 NOTE — Progress Notes (Signed)
[facility] [phone] Progress Note   Laurie Horton [id number] y.o.  03/20/2015  12:45 PM  Chief Complaint:  Medication management and follow-up.      History of Present Illness:  Laurie Horton came for her followup appointment.  She is taking trazodone and Pristiq as prescribed. She denied any side effects.  She was sad in 03/16/23 because her sister died in [place].  She could not go to  [facility] services because she was not feeling well.  Overall she feels that current medicine is working very well.  She denies any crying spells or any irritability.  She is concerned about finances and her health needs. She is seeing Dr. Dimas Millin for fibromyalgia and chronic pain and she is getting pain medication.  Recently she did test and she was found to be positive with amphetamine but patient denied ever using amphetamines. She is very scared that her physician may stop the pain medication but I encourage her to get another test if she is not taking it. Patient is not interested in counseling despite she has lot of psychosocial issues and family issues. She cannot afford counseling. Patient lives by herself and her daughter live close by.  Her appetite is okay.  She denies any feeling of hopelessness or worthlessness.   She denies any nightmares flashback or any bad dreams. Her sleep is good with the trazodone. She denies any agitation, anger, severe mood swings. Her chronic pain is a stable with medication. Her vitals are stable.  Suicidal Ideation: No Plan Formed: No Patient has means to carry out plan: No  Homicidal Ideation: No Plan Formed: No Patient has means to carry out plan: No  Past Psychiatric History/Hospitalization(s) Patient denies any previous history of psychiatric inpatient treatment, suicidal attempt, paranoia, hallucination or any mania.  She started seeing psychiatrist in 2006 after she had a stroke.  She had tried Cymbalta and Celexa in the past.  She also tried Ambien,  Lunesta and Xanax but developed allergies and side effects. Anxiety: Yes Bipolar Disorder: No Depression: Yes Mania: No Psychosis: No Schizophrenia: No Personality Disorder: No Hospitalization for psychiatric illness: No History of Electroconvulsive Shock Therapy: No Prior Suicide Attempts: No  Medical History; Patient has hypertension, fibromyalgia, scoliosis, headaches and history of stroke.  She has some weakness on her right hand which is minimal.  She also have loss of peripheral vision.  Her primary care physician is Dr. Everlene Farrier.  Substance Abuse History; Patient denies any history of substance use.   Review of Systems  Constitutional: Negative.   HENT: Negative.   Musculoskeletal: Positive for back pain and joint pain.  Skin: Negative.   Neurological: Negative for dizziness.   Psychiatric: Agitation: No Hallucination: No Depressed Mood: No Insomnia: No Hypersomnia: No Altered Concentration: No Feels Worthless: No Grandiose Ideas: No Belief In Special Powers: No New/Increased Substance Abuse: No Compulsions: No  Neurologic: Headache: Yes Seizure: No Paresthesias: No   Musculoskeletal: Strength & Muscle Tone: Patient endorsed some weakness in her right side but it is very minimal. Gait & Station: She has some difficulty in walking because of right-sided weakness. Patient leans: Front   Outpatient Encounter Prescriptions as of 03/20/2015  Medication Sig  . albuterol (PROVENTIL) (2.5 MG/3ML) 0.083% nebulizer solution Take 2.5 mg by nebulization every 6 (six) hours as needed for wheezing or shortness of breath.   Marland Kitchen albuterol (VENTOLIN HFA) 108 (90 BASE) MCG/ACT inhaler Inhale 2 puffs into the lungs every 4 (four) hours as needed for wheezing. (Patient  taking differently: Inhale 2 puffs into the lungs every 4 (four) hours as needed for wheezing (rescue inhaler). )  . desvenlafaxine (PRISTIQ) 50 MG 24 hr tablet Take 1 tablet (50 mg total) by mouth daily.  .  diazepam (VALIUM) 2 MG tablet Take 1 tablet (2 mg total) by mouth as needed for anxiety.  Marland Kitchen HYDROcodone-acetaminophen (NORCO/VICODIN) 5-325 MG per tablet Take 1 tablet by mouth 2 (two) times daily.  Marland Kitchen ipratropium (ATROVENT) 0.02 % nebulizer solution Take 500 mcg by nebulization every 4 (four) hours as needed (thightness).   . montelukast (SINGULAIR) 10 MG tablet TAKE 1 TABLET AT BEDTIME  . NORVASC 5 MG tablet TAKE 1 TABLET BY MOUTH EVERY DAY.  Marland Kitchen ondansetron (ZOFRAN-ODT) 4 MG disintegrating tablet Take 1 tablet (4 mg total) by mouth every 8 (eight) hours as needed for nausea or vomiting.  Marland Kitchen oxybutynin (DITROPAN-XL) 10 MG 24 hr tablet Take 10 mg by mouth daily.  . pantoprazole (PROTONIX) 40 MG tablet Take 1 tablet by mouth daily.  . polyethylene glycol (MIRALAX / GLYCOLAX) packet Take 17 g by mouth daily as needed.   . pregabalin (LYRICA) 50 MG capsule take 2 capsules by oral route daily  . tiZANidine (ZANAFLEX) 4 MG tablet Take 1 mg by mouth as needed for muscle spasms.   . TOPROL XL 25 MG 24 hr tablet   . traZODone (DESYREL) 100 MG tablet Take 1 tablet (100 mg total) by mouth at bedtime.  Marland Kitchen warfarin (COUMADIN) 5 MG tablet Take 1 tablet (5 mg total) by mouth daily.  . [DISCONTINUED] desvenlafaxine (PRISTIQ) 50 MG 24 hr tablet Take 1 tablet (50 mg total) by mouth daily.  . [DISCONTINUED] desvenlafaxine (PRISTIQ) 50 MG 24 hr tablet Take 1 tablet (50 mg total) by mouth daily.  . [DISCONTINUED] metoprolol succinate (TOPROL-XL) 50 MG 24 hr tablet Take 1 tablet (50 mg total) by mouth daily. Take with or immediately following a meal.  . [DISCONTINUED] traZODone (DESYREL) 100 MG tablet Take 1 tablet (100 mg total) by mouth at bedtime.   No facility-administered encounter medications on file as of 03/20/2015.    Recent Results (from the past 2160 hour(s))  POCT INR     Status: None   Collection Time: 01/07/15 10:34 AM  Result Value Ref Range   INR 3   POCT INR     Status: None   Collection Time:  02/04/15  9:47 AM  Result Value Ref Range   INR 2.7   POCT INR     Status: None   Collection Time: 02/26/15  2:02 PM  Result Value Ref Range   INR 2.0       Constitutional:  BP 130/80 mmHg  Pulse 74  Ht 5\' 4"  (1.626 m)  Wt 195 lb 9.6 oz (88.724 kg)  BMI 33.56 kg/m2   Mental Status Examination;  Patient is casually dressed and fairly groomed.  Her speech is clear, coherent with normal tone and volume. She described her mood good and her affect is appropriate. She denies any auditory or visual hallucination.  She denies any active or passive suicidal thoughts or homicidal thoughts.  There are no delusions, paranoia or any obsessive thoughts although she has a lot of ruminative thoughts.  Her attention and concentration is fair.  Her psychomotor activity is slightly decreased.  There were no flight of ideas or any loose association.  Her thought process is circumstantial.  Her fund of knowledge is adequate.  She is alert and oriented x3.  Her  cognition is grossly intact.  Her insight judgment and impulse control is okay.   Established Problem, Stable/Improving (1), Review of Psycho-Social Stressors (1), Review of Last Therapy Session (1) and Review of Medication Regimen & Side Effects (2)  Assessment: Axis I:  Depression , Anxiety disorder NOS, Mood disorder due to general medical condition, posttraumatic stress disorder.  Axis II: Deferred  Axis III:  Past Medical History  Diagnosis Date  . Depression   . GERD (gastroesophageal reflux disease)   . HTN (hypertension)   . Hiatal hernia   . COPD (chronic obstructive pulmonary disease)   . Diverticulitis   . DVT (deep venous thrombosis)   . Obesity   . Hyperlipidemia   . PFO (patent foramen ovale)   . PAT (paroxysmal atrial tachycardia)   . Right middle lobe pneumonia 07/24/2011    First noted at admit 07/10/11. Persists on cxr 07/22/11. Cleared on CT 08/24/11. No further followup  . PULMONARY NODULE, LEFT LOWER LOBE 10/14/2009     58mm LLL nodule dec 2010. Stable and 11mm in Oct 2012. No further fu  . TOBACCO ABUSE 06/04/2009  . Osteoporosis   . Fibromyalgia   . Stroke     Plan:  Patient is doing better on her current medication. She has no side effects. Her depression and anxiety is under control.  I will continue Pristiq 50 mg daily, trazodone 100 mg at bedtime and Valium 2 mg as needed for severe panic attack.  Recommended to call us back if she has any question, concern or if she feels worsening of the symptom.  Follow-up in 3 months.   Evette Diclemente T., MD 03/20/2015

## 2015-03-20 NOTE — Telephone Encounter (Signed)
Received electronic message from [facility]--message below.  Called [facility] and spoke with a Caren Griffins.  Per Caren Griffins they have the medication, it has not been discontinued, disregard the message.   The following prescriptions have been discontinued, but the pharmacy has not been notified. To stop the pharmacy from filling this prescription, you must call it directly.      Pharmacy     [id number] [place], [place] [address]    Phone: [phone] Fax: [phone]    Open 24 Hours?: No          Orders     desvenlafaxine (PRISTIQ) 50 MG 24 hr tablet

## 2015-03-24 ENCOUNTER — Other Ambulatory Visit: Payer: Self-pay | Admitting: Emergency Medicine

## 2015-03-25 ENCOUNTER — Telehealth: Payer: Self-pay

## 2015-03-25 NOTE — Telephone Encounter (Signed)
Received a request from Dr.Edwards office wanting to know if ok for patient to hold coumadin 3 days prior to endo.Dr.Jordan advised ok.Faxed back at fax # [phone].

## 2015-03-27 ENCOUNTER — Ambulatory Visit: Payer: Self-pay | Admitting: Pharmacist Clinician (PhC)/ Clinical Pharmacy Specialist

## 2015-04-02 ENCOUNTER — Ambulatory Visit (INDEPENDENT_AMBULATORY_CARE_PROVIDER_SITE_OTHER): Payer: Medicare Other | Admitting: Emergency Medicine

## 2015-04-02 ENCOUNTER — Encounter: Payer: Self-pay | Admitting: Emergency Medicine

## 2015-04-02 VITALS — BP 148/92 | HR 70 | Temp 98.1°F | Resp 18 | Ht 64.0 in | Wt 192.2 lb

## 2015-04-02 DIAGNOSIS — R892 Abnormal level of other drugs, medicaments and biological substances in specimens from other organs, systems and tissues: Secondary | ICD-10-CM | POA: Diagnosis not present

## 2015-04-02 DIAGNOSIS — I6522 Occlusion and stenosis of left carotid artery: Secondary | ICD-10-CM

## 2015-04-02 DIAGNOSIS — I482 Chronic atrial fibrillation, unspecified: Secondary | ICD-10-CM

## 2015-04-02 DIAGNOSIS — I6529 Occlusion and stenosis of unspecified carotid artery: Secondary | ICD-10-CM | POA: Insufficient documentation

## 2015-04-02 DIAGNOSIS — M797 Fibromyalgia: Secondary | ICD-10-CM

## 2015-04-02 DIAGNOSIS — J449 Chronic obstructive pulmonary disease, unspecified: Secondary | ICD-10-CM

## 2015-04-02 DIAGNOSIS — R0989 Other specified symptoms and signs involving the circulatory and respiratory systems: Secondary | ICD-10-CM

## 2015-04-02 MED ORDER — ALBUTEROL SULFATE HFA 108 (90 BASE) MCG/ACT IN AERS: 2.0000 | INHALATION_SPRAY | RESPIRATORY_TRACT | Status: DC | PRN

## 2015-04-02 NOTE — Addendum Note (Signed)
Addended by: Arlyss Queen A on: 04/02/2015 09:49 AM   Modules accepted: Orders

## 2015-04-02 NOTE — Progress Notes (Addendum)
   Subjective:    Patient ID: Laurie Horton, female    DOB: 1946/02/22, 69 y.o.   MRN: [id number] This chart was scribed for Arlyss Queen, MD by Zola Button, Medical Scribe. This patient was seen in room 21 and the patient's care was started at 9:06 AM.   HPI HPI Comments: Laurie Horton is a 69 y.o. female with a hx of fibromyalgia, COPD and A Fib who presents to the [facility] for a follow-up.   Patient recently had an oral surgery done by Dr. Owens Shark. She has recovered since the surgery, but she still cannot chew. She has been a restricted diet of soft foods for the past week.  She had lab work done at [facility]; she believes there was a mistake in the report. The report showed positive for amphetamines, but showed negative at the bottom of the report. She does not take amphetamines. Dr. Estanislado Pandy still refilled her hydrocodone.  She has been going to physical therapy at the [facility].  Patient takes trazodone to help her sleep at night. She occasionally takes valium for anxiety; she has been careful with this.  Patient states she is currently in atrial fibrillation. Her Toprol was increased to 25 mg. She also takes Norvasc.  Review of Systems  Cardiovascular: Positive for palpitations.       Objective:   Physical Exam CONSTITUTIONAL: Well developed/well nourished HEAD: Normocephalic/atraumatic EYES: EOM/PERRL ENMT: Mucous membranes moist. 3 extractions sites in her mouth which appear to be healing well with suture material present NECK: supple no meningeal signs there is a faint left carotid bruit SPINE: entire spine nontender CV: Irregular rhythm consistent with atrial fibrillation LUNGS: Decreased breath sounds both bases, no wheezes ABDOMEN: soft, nontender, no rebound or guarding GU: no cva tenderness NEURO: Pt is awake/alert, moves all extremitiesx4 EXTREMITIES: pulses normal, full ROM, no edema SKIN: warm, color normal PSYCH: no  abnormalities of mood noted     Assessment & Plan:  Patient is stable at present. Her drug screen tested positive on the initial testing for amphetamines, however on the confirmatory tests they are both negative. She does not have any history of misuse of medications. Her albuterol inhaler was refilled. She has recently completed physical therapy for her fibromyalgia. Her atrial fibrillation has been stable. Her Toprol dosage was recently increased. Pulse ox was 98%. Will recheck in 3 months she is stable at present. My opinion on her drug screen was that this was a data entry mistake.I personally performed the services described in this documentation, which was scribed in my presence. The recorded information has been reviewed and is accurate. We'll go ahead and do carotid Doppler studies because the daughter has noticed some possible facial asymmetry with drooling on the right. She does have a faint left carotid bruit. The patient did have a carotid study done in 2011. This showed a 50-69% blockage on the left. Apparently she has not had a follow-up study since that time. She will continue her Lyrica she takes for fibromyalgia and chronic pain  Nena Jordan, MD

## 2015-04-02 NOTE — Progress Notes (Deleted)
   Subjective:    Patient ID: Laurie Horton, female    DOB: 30-Sep-1946, 69 y.o.   MRN: [id number]  HPI    Review of Systems     Objective:   Physical Exam        Assessment & Plan:

## 2015-04-03 ENCOUNTER — Ambulatory Visit (INDEPENDENT_AMBULATORY_CARE_PROVIDER_SITE_OTHER): Payer: Medicare Other | Admitting: Pharmacist Clinician (PhC)/ Clinical Pharmacy Specialist

## 2015-04-03 DIAGNOSIS — I480 Paroxysmal atrial fibrillation: Secondary | ICD-10-CM

## 2015-04-03 DIAGNOSIS — Z7901 Long term (current) use of anticoagulants: Secondary | ICD-10-CM

## 2015-04-03 LAB — POCT INR: INR: 3.5

## 2015-04-04 ENCOUNTER — Encounter (HOSPITAL_COMMUNITY): Payer: Self-pay | Admitting: *Deleted

## 2015-04-04 ENCOUNTER — Telehealth: Payer: Self-pay

## 2015-04-04 NOTE — Telephone Encounter (Signed)
Received fax from [facility] [address] that PA is needed for Ventolin inhaler. Called pharm and LM to fill with either ProAir or Proventil, whichever is covered by ins.

## 2015-04-15 ENCOUNTER — Encounter (HOSPITAL_COMMUNITY): Payer: Self-pay

## 2015-04-15 ENCOUNTER — Other Ambulatory Visit: Payer: Self-pay | Admitting: Gastroenterology

## 2015-04-15 ENCOUNTER — Ambulatory Visit (HOSPITAL_COMMUNITY)
Admission: RE | Admit: 2015-04-15 | Discharge: 2015-04-15 | Disposition: A | Discharge status: Home / Self Care | Payer: Medicare Other | Source: Ambulatory Visit | Attending: Gastroenterology | Admitting: Gastroenterology

## 2015-04-15 ENCOUNTER — Ambulatory Visit (HOSPITAL_COMMUNITY): Payer: Medicare Other | Admitting: Anesthesiology

## 2015-04-15 ENCOUNTER — Encounter (HOSPITAL_COMMUNITY)
Admission: RE | Disposition: A | Discharge status: Home / Self Care | Payer: Self-pay | Source: Ambulatory Visit | Attending: Gastroenterology

## 2015-04-15 DIAGNOSIS — Q211 Atrial septal defect: Secondary | ICD-10-CM | POA: Diagnosis not present

## 2015-04-15 DIAGNOSIS — E785 Hyperlipidemia, unspecified: Secondary | ICD-10-CM | POA: Diagnosis not present

## 2015-04-15 DIAGNOSIS — Z87891 Personal history of nicotine dependence: Secondary | ICD-10-CM | POA: Diagnosis not present

## 2015-04-15 DIAGNOSIS — I10 Essential (primary) hypertension: Secondary | ICD-10-CM | POA: Insufficient documentation

## 2015-04-15 DIAGNOSIS — Z9981 Dependence on supplemental oxygen: Secondary | ICD-10-CM | POA: Diagnosis not present

## 2015-04-15 DIAGNOSIS — Z86718 Personal history of other venous thrombosis and embolism: Secondary | ICD-10-CM | POA: Insufficient documentation

## 2015-04-15 DIAGNOSIS — Z6832 Body mass index (BMI) 32.0-32.9, adult: Secondary | ICD-10-CM | POA: Diagnosis not present

## 2015-04-15 DIAGNOSIS — F329 Major depressive disorder, single episode, unspecified: Secondary | ICD-10-CM | POA: Diagnosis not present

## 2015-04-15 DIAGNOSIS — Z8673 Personal history of transient ischemic attack (TIA), and cerebral infarction without residual deficits: Secondary | ICD-10-CM | POA: Diagnosis not present

## 2015-04-15 DIAGNOSIS — K449 Diaphragmatic hernia without obstruction or gangrene: Secondary | ICD-10-CM | POA: Diagnosis not present

## 2015-04-15 DIAGNOSIS — Z79899 Other long term (current) drug therapy: Secondary | ICD-10-CM | POA: Diagnosis not present

## 2015-04-15 DIAGNOSIS — I480 Paroxysmal atrial fibrillation: Secondary | ICD-10-CM | POA: Insufficient documentation

## 2015-04-15 DIAGNOSIS — Z7901 Long term (current) use of anticoagulants: Secondary | ICD-10-CM | POA: Diagnosis not present

## 2015-04-15 DIAGNOSIS — K219 Gastro-esophageal reflux disease without esophagitis: Secondary | ICD-10-CM | POA: Diagnosis not present

## 2015-04-15 DIAGNOSIS — J9611 Chronic respiratory failure with hypoxia: Secondary | ICD-10-CM | POA: Diagnosis not present

## 2015-04-15 DIAGNOSIS — I471 Supraventricular tachycardia: Secondary | ICD-10-CM | POA: Diagnosis not present

## 2015-04-15 DIAGNOSIS — R1013 Epigastric pain: Secondary | ICD-10-CM | POA: Diagnosis present

## 2015-04-15 DIAGNOSIS — E669 Obesity, unspecified: Secondary | ICD-10-CM | POA: Insufficient documentation

## 2015-04-15 DIAGNOSIS — I6529 Occlusion and stenosis of unspecified carotid artery: Secondary | ICD-10-CM | POA: Insufficient documentation

## 2015-04-15 DIAGNOSIS — J449 Chronic obstructive pulmonary disease, unspecified: Secondary | ICD-10-CM | POA: Insufficient documentation

## 2015-04-15 HISTORY — DX: Adverse effect of unspecified anesthetic, initial encounter: T41.45XA

## 2015-04-15 HISTORY — DX: Personal history of other diseases of the respiratory system: Z87.09

## 2015-04-15 HISTORY — PX: ESOPHAGOGASTRODUODENOSCOPY (EGD) WITH PROPOFOL: SHX5813

## 2015-04-15 HISTORY — DX: Dependence on supplemental oxygen: Z99.81

## 2015-04-15 HISTORY — DX: Other complications of anesthesia, initial encounter: T88.59XA

## 2015-04-15 SURGERY — ESOPHAGOGASTRODUODENOSCOPY (EGD) WITH PROPOFOL
Anesthesia: Monitor Anesthesia Care

## 2015-04-15 MED ORDER — PROPOFOL 500 MG/50ML IV EMUL
INTRAVENOUS | Status: DC | PRN
  Administered 2015-04-15: 40 mg via INTRAVENOUS

## 2015-04-15 MED ORDER — LACTATED RINGERS IV SOLN
INTRAVENOUS | Status: DC
  Administered 2015-04-15: 11:00:00 via INTRAVENOUS

## 2015-04-15 MED ORDER — PROPOFOL 10 MG/ML IV BOLUS
INTRAVENOUS | Status: AC
Start: 2015-04-15 — End: 2015-04-15
  Filled 2015-04-15: qty 40

## 2015-04-15 MED ORDER — PROPOFOL INFUSION 10 MG/ML OPTIME
INTRAVENOUS | Status: DC | PRN
  Administered 2015-04-15: 120 ug/kg/min via INTRAVENOUS

## 2015-04-15 MED ORDER — FENTANYL CITRATE (PF) 100 MCG/2ML IJ SOLN: 25.0000 ug | INTRAMUSCULAR | Status: DC | PRN

## 2015-04-15 SURGICAL SUPPLY — 14 items

## 2015-04-15 NOTE — Op Note (Signed)
[facility] [place], [address]   ENDOSCOPY PROCEDURE REPORT  PATIENT: Laurie, Horton  MR#: [id number] BIRTHDATE: 28-Apr-1946 , 22  yrs. old GENDER: female ENDOSCOPIST: Oletta Lamas, MD REFERRED BY: Arlyss Queen, M.D. PROCEDURE DATE:  04/15/2015 PROCEDURE:   EGD, diagnostic ASA CLASS:    Class III INDICATIONS: EG abdominla pain. MEDICATION: Propofol 100 mg IV TOPICAL ANESTHETIC:   Cetacaine Spray  DESCRIPTION OF PROCEDURE:   After the risks and benefits of the procedure were explained, informed consent was obtained.  The Pentax Gastroscope [id number]  endoscope was introduced through the mouth  and advanced to the second portion of the duodenum .  The instrument was slowly withdrawn as the mucosa was fully examined. Estimated blood loss is zero unless otherwise noted in this procedure report.      EXAM: The esophagus and gastroesophageal junction were completely normal in appearance.  The stomach was entered and closely examined.The antrum, angularis, and lesser curvature were well visualized, including a retroflexed view of the cardia and fundus. The stomach wall was normally distensable.  The scope passed easily through the pylorus into the duodenum.    Retroflexed views revealed no abnormalities.    The scope was then withdrawn from the patient and the procedure completed.  COMPLICATIONS: There were no immediate complications.  ENDOSCOPIC IMPRESSION: Normal appearing esophagus and GE junction, the stomach was well visualized and normal in appearance, normal appearing duodenum. No cause for abdominal pain found on EGD RECOMMENDATIONS: Resume all meds today and ret ov in 2 months   _______________________________ eSignedLaurence Spates, MD 04/15/2015 11:53 AM     cc: Arlyss Queen, MD Peter Martinique, MD Jonathon Bellows, MD  CPT CODES: ICD CODES:  The ICD and CPT codes recommended by this software are interpretations from the  data that the clinical staff has captured with the software.  The verification of the translation of this report to the ICD and CPT codes and modifiers is the sole responsibility of the health care institution and practicing physician where this report was generated.  [employer] will not be held responsible for the validity of the ICD and CPT codes included on this report.  AMA assumes no liability for data contained or not contained herein. CPT is a Designer, television/film set of the [employer].

## 2015-04-15 NOTE — Anesthesia Preprocedure Evaluation (Addendum)
Anesthesia Evaluation  Patient identified by MRN, date of birth, ID band Patient awake    Reviewed: Allergy & Precautions, H&P , NPO status , Patient's Chart, lab work & pertinent test results, reviewed documented beta blocker date and time   Airway Mallampati: II  TM Distance: >3 FB Neck ROM: full    Dental no notable dental hx. (+) Dental Advisory Given, Teeth Intact   Pulmonary COPD COPD inhaler and oxygen dependent, former smoker,  History chronic respiratory failure with hypoxia breath sounds clear to auscultation  Pulmonary exam normal       Cardiovascular Exercise Tolerance: Good hypertension, On Home Beta Blockers Normal cardiovascular exam+ dysrhythmias Rhythm:regular Rate:Normal  PAT. PFO   Neuro/Psych Carotid stenosis CVA negative psych ROS   GI/Hepatic negative GI ROS, Neg liver ROS, hiatal hernia, GERD-  Medicated and Controlled,  Endo/Other  negative endocrine ROS  Renal/GU negative Renal ROS  negative genitourinary   Musculoskeletal   Abdominal   Peds  Hematology negative hematology ROS (+)   Anesthesia Other Findings   Reproductive/Obstetrics negative OB ROS                           Anesthesia Physical Anesthesia Plan  ASA: III  Anesthesia Plan: MAC   Post-op Pain Management:    Induction:   Airway Management Planned:   Additional Equipment:   Intra-op Plan:   Post-operative Plan:   Informed Consent: I have reviewed the patients History and Physical, chart, labs and discussed the procedure including the risks, benefits and alternatives for the proposed anesthesia with the patient or authorized representative who has indicated his/her understanding and acceptance.   Dental Advisory Given  Plan Discussed with: CRNA and Surgeon  Anesthesia Plan Comments:         Anesthesia Quick Evaluation

## 2015-04-15 NOTE — Addendum Note (Signed)
Addended by: Vernie Ammons. on: 04/15/2015 11:14 AM   Modules accepted: Orders

## 2015-04-15 NOTE — Transfer of Care (Signed)
Immediate Anesthesia Transfer of Care Note  Patient: Laurie Horton  Procedure(s) Performed: Procedure(s): ESOPHAGOGASTRODUODENOSCOPY (EGD) WITH PROPOFOL (N/A)  Patient Location: PACU  Anesthesia Type:MAC  Level of Consciousness: awake, alert  and oriented  Airway & Oxygen Therapy: Patient Spontanous Breathing and Patient connected to nasal cannula oxygen  Post-op Assessment: Report given to RN and Post -op Vital signs reviewed and stable  Post vital signs: Reviewed and stable  Last Vitals:  Filed Vitals:   04/15/15 1154  BP: 106/63  Pulse: 66  Temp: 37 C  Resp: 19    Complications: No apparent anesthesia complications

## 2015-04-15 NOTE — Anesthesia Postprocedure Evaluation (Signed)
  Anesthesia Post-op Note  Patient: Laurie Horton  Procedure(s) Performed: Procedure(s) (LRB): ESOPHAGOGASTRODUODENOSCOPY (EGD) WITH PROPOFOL (N/A)  Patient Location: PACU  Anesthesia Type: MAC  Level of Consciousness: awake and alert   Airway and Oxygen Therapy: Patient Spontanous Breathing  Post-op Pain: mild  Post-op Assessment: Post-op Vital signs reviewed, Patient's Cardiovascular Status Stable, Respiratory Function Stable, Patent Airway and No signs of Nausea or vomiting  Last Vitals:  Filed Vitals:   04/15/15 1154  BP: 106/63  Pulse: 66  Temp: 37 C  Resp: 19    Post-op Vital Signs: stable   Complications: No apparent anesthesia complications

## 2015-04-15 NOTE — Discharge Instructions (Addendum)
Resume the coumadin today at the previous dose, Care After Refer to this sheet in the next few weeks. These instructions provide you with information on caring for yourself after your procedure. Your health care provider may also give you more specific instructions. Your treatment has been planned according to current medical practices, but problems sometimes occur. Call your health care provider if you have any problems or questions after your procedure. WHAT TO EXPECT AFTER THE PROCEDURE  After your procedure, it is typical to have the following:  A small amount of blood in your stool.  Moderate amounts of gas and mild abdominal cramping or bloating. HOME CARE INSTRUCTIONS  Do not drive, operate machinery, or sign important documents for 24 hours.  You may shower and resume your regular physical activities, but move at a slower pace for the first 24 hours.  Take frequent rest periods for the first 24 hours.  Walk around or put a warm pack on your abdomen to help reduce abdominal cramping and bloating.  Drink enough fluids to keep your urine clear or pale yellow.  You may resume your normal diet as instructed by your health care provider. Avoid heavy or fried foods that are hard to digest.  Avoid drinking alcohol for 24 hours or as instructed by your health care provider.  Only take over-the-counter or prescription medicines as directed by your health care provider.  If a tissue sample (biopsy) was taken during your procedure:  Do not take aspirin or blood thinners for 7 days, or as instructed by your health care provider.  Do not drink alcohol for 7 days, or as instructed by your health care provider.  Eat soft foods for the first 24 hours. SEEK MEDICAL CARE IF: You have persistent spotting of blood in your stool 2-3 days after the procedure. SEEK IMMEDIATE MEDICAL CARE IF:  You have more than a small spotting of blood in your stool.  You pass large blood clots in your  stool.  Your abdomen is swollen (distended).  You have nausea or vomiting.  You have a fever.  You have increasing abdominal pain that is not relieved with medicine. Document Released: 06/02/2004 Document Revised: 08/09/2013 Document Reviewed: 06/26/2013 [facility] Patient Information 2015 [place], [place]. This information is not intended to replace advice given to you by your health care provider. Make sure you discuss any questions you have with your health care provider.

## 2015-04-15 NOTE — H&P (Signed)
Subjective:   Patient is a 69 y.o. female presents with EG abdominal paiin . No coumadin for 3 days for EGD Procedure including risks and benefits discussed in office.  Patient Active Problem List   Diagnosis Date Noted  . Carotid stenosis 04/02/2015  . Long-term (current) use of anticoagulants 09/26/2014  . PAF (paroxysmal atrial fibrillation) 09/19/2014  . Chronic respiratory failure with hypoxia 06/27/2014  . COPD exacerbation 06/27/2014  . Chest wall pain 04/06/2014  . Abnormal ECG 05/10/2013  . Atypical chest pain 05/04/2013  . Preoperative respiratory examination 05/02/2013  . Hyperglycemia, drug-induced 10/12/2011  . Encounter for long-term (current) use of medications 08/21/2011  . PAT (paroxysmal atrial tachycardia) 08/21/2011  . Right middle lobe pneumonia 07/24/2011  . Oral thrush 07/24/2011  . UPPER RESPIRATORY INFECTION, VIRAL 07/11/2010  . OTHER ACUTE SINUSITIS 06/04/2010  . PULMONARY NODULE, LEFT LOWER LOBE 10/14/2009  . PATENT FORAMEN OVALE 10/14/2009  . DEPRESSION 06/04/2009  . Essential hypertension 06/04/2009  . COPD (chronic obstructive pulmonary disease) 06/04/2009  . G E R D 06/04/2009  . SNORING, HX OF 06/04/2009   Past Medical History  Diagnosis Date  . Depression   . GERD (gastroesophageal reflux disease)   . HTN (hypertension)   . Hiatal hernia   . COPD (chronic obstructive pulmonary disease)   . Diverticulitis   . DVT (deep venous thrombosis)   . Obesity   . Hyperlipidemia   . PFO (patent foramen ovale)   . PAT (paroxysmal atrial tachycardia)   . Right middle lobe pneumonia 07/24/2011    First noted at admit 07/10/11. Persists on cxr 07/22/11. Cleared on CT 08/24/11. No further followup  . PULMONARY NODULE, LEFT LOWER LOBE 10/14/2009    90mm LLL nodule dec 2010. Stable and 76mm in Oct 2012. No further fu  . TOBACCO ABUSE 06/04/2009  . Osteoporosis   . Fibromyalgia   . Stroke   . History of deviated nasal septum     left- side  . On  supplemental oxygen therapy     concentrator at night @ 1.5 l/m or when sleeps. O2 Sat niormally 87.  . Complication of anesthesia     various issues with oxygen  saturations post op    Past Surgical History  Procedure Laterality Date  . Total abdominal hysterectomy      post op needed oxygen was told "she gave them a scare"  . Knee arthroscopy  2000    left  . Laparoscopic cholecystectomy  04-16-2010    [facility]  . Tubal ligation    . US echocardiography  11/20/2009    EF 55-60%  . Cardiovascular stress test  12/26/2004    EF 74%. NO EVIDENCE OF ISCHEMIA  . Mouth surgery      03-26-15 multiple extractions stitches remains    Prescriptions prior to admission  Medication Sig Dispense Refill Last Dose  . albuterol (PROVENTIL) (2.5 MG/3ML) 0.083% nebulizer solution Take 2.5 mg by nebulization every 6 (six) hours as needed for wheezing or shortness of breath.    Past Week at Unknown time  . chlorhexidine (PERIDEX) 0.12 % solution Use as directed 15 mLs in the mouth or throat 2 (two) times daily.   04/12/2015 at Unknown time  . desvenlafaxine (PRISTIQ) 50 MG 24 hr tablet Take 1 tablet (50 mg total) by mouth daily. 30 tablet 2 04/15/2015 at 0500  . diazepam (VALIUM) 2 MG tablet Take 1 tablet (2 mg total) by mouth as needed for anxiety. (Patient taking differently: Take 2  mg by mouth daily as needed for anxiety. ) 30 tablet 0 Past Week at Unknown time  . HYDROcodone-acetaminophen (NORCO/VICODIN) 5-325 MG per tablet Take 1 tablet by mouth 2 (two) times daily.   04/14/2015 at 1930  . ipratropium (ATROVENT) 0.02 % nebulizer solution Take 500 mcg by nebulization every 4 (four) hours as needed (thightness).    Past Month at Unknown time  . montelukast (SINGULAIR) 10 MG tablet TAKE 1 TABLET AT BEDTIME (Patient taking differently: Take one tablet by mouth daily in the morning.) 30 tablet 2 04/15/2015 at 0500  . NORVASC 5 MG tablet TAKE 1 TABLET BY MOUTH EVERY DAY. 30 tablet 2 04/15/2015 at 0500  . ondansetron  (ZOFRAN-ODT) 4 MG disintegrating tablet Take 1 tablet (4 mg total) by mouth every 8 (eight) hours as needed for nausea or vomiting. 20 tablet 0 Past Week at Unknown time  . oxybutynin (DITROPAN-XL) 10 MG 24 hr tablet Take 10 mg by mouth every morning.   3 04/15/2015 at 0500  . OXYGEN Inhale 0.5 L into the lungs. For naps and sleep.   04/14/2015 at Unknown time  . pantoprazole (PROTONIX) 40 MG tablet Take 1 tablet by mouth every morning.    04/15/2015 at 0500  . polyethylene glycol (MIRALAX / GLYCOLAX) packet Take 17 g by mouth daily as needed for mild constipation.    Past Week at Unknown time  . pregabalin (LYRICA) 50 MG capsule Take 50 mg by mouth 2 (two) times daily. ta   04/14/2015 at 1830  . tiZANidine (ZANAFLEX) 4 MG tablet Take 1 mg by mouth daily as needed for muscle spasms.    04/14/2015 at Unknown time  . TOPROL XL 25 MG 24 hr tablet Take 25 mg by mouth every morning.   4 04/15/2015 at 0500  . traZODone (DESYREL) 100 MG tablet Take 1 tablet (100 mg total) by mouth at bedtime. 90 tablet 0 04/14/2015 at 1930  . warfarin (COUMADIN) 5 MG tablet Take 1 tablet (5 mg total) by mouth daily. (Patient taking differently: Take 2.5-5 mg by mouth daily. Takes one tablet (5 mg) on Mon, Wed, and Friday and half a tablet on all other days.) 30 tablet 2 04/05/2015  . albuterol (VENTOLIN HFA) 108 (90 BASE) MCG/ACT inhaler Inhale 2 puffs into the lungs every 4 (four) hours as needed for wheezing (rescue inhaler). 8.5 Inhaler 5 More than a month at Unknown time  . clindamycin (CLEOCIN) 300 MG capsule Take 600 mg by mouth. One hour prior to dental surgery   More than a month at Unknown time   Allergies  Allergen Reactions  . Alprazolam     REACTION: stops breathing  . Pseudoephedrine Hcl Er Shortness Of Breath  . Budesonide-Formoterol Fumarate     bliusters  . Aciphex [Rabeprazole Sodium]     rash  . Avelox [Moxifloxacin Hcl In Nacl]     Stomach cramps.   . Esomeprazole Magnesium     REACTION: "bouncing off  walls"  . Iodine     REACTION: swelling in throat  . Loratadine     claritin D causes shaking  . Lotrimin [Clotrimazole] Other (See Comments)    blisters  . Lunesta [Eszopiclone]     REACTION: "slept for a week"  . Other     Glue from ekg/heart monitor leads --rash +  Any MYCINS  . Oxcarbazepine Other (See Comments)    Causes deep sleep and dizziness  . Zolpidem Tartrate     REACTION: "slept for a  week"  . Bextra [Valdecoxib] Rash  . Ceclor [Cefaclor] Rash  . Covera-Hs [Verapamil Hcl] Palpitations  . Dicyclomine Hcl Rash  . Tessalon Perles Rash    History  Substance Use Topics  . Smoking status: Former Smoker    Quit date: 06/02/2010  . Smokeless tobacco: Not on file  . Alcohol Use: No    Family History  Problem Relation Age of Onset  . Dementia Mother   . Diabetes Mother   . Alzheimer's disease Mother   . Heart attack Brother 56  . Schizophrenia Sister   . Diabetes Sister   . Tremor Sister   . Heart attack Father      Objective:   Patient Vitals for the past 8 hrs:  BP Temp Temp src Pulse Resp SpO2 Height Weight  04/15/15 1014 (!) 150/95 mmHg 98.1 F (36.7 C) Oral 95 19 93 % 5\' 4"  (1.626 m) 87.091 kg (192 lb)         See MD Preop evaluation      Assessment:   1. EG Abdominal pain ? cause  Plan:   EGD today with propofol sedation. Discussed in detail in the office.

## 2015-04-16 ENCOUNTER — Encounter (HOSPITAL_COMMUNITY): Payer: Self-pay | Admitting: Gastroenterology

## 2015-05-01 ENCOUNTER — Encounter: Payer: Self-pay | Admitting: Emergency Medicine

## 2015-05-01 ENCOUNTER — Ambulatory Visit (INDEPENDENT_AMBULATORY_CARE_PROVIDER_SITE_OTHER): Payer: Medicare Other | Admitting: Pharmacist Clinician (PhC)/ Clinical Pharmacy Specialist

## 2015-05-01 DIAGNOSIS — I480 Paroxysmal atrial fibrillation: Secondary | ICD-10-CM | POA: Diagnosis not present

## 2015-05-01 DIAGNOSIS — Z7901 Long term (current) use of anticoagulants: Secondary | ICD-10-CM | POA: Diagnosis not present

## 2015-05-01 LAB — POCT INR: INR: 2.4

## 2015-05-22 ENCOUNTER — Other Ambulatory Visit: Payer: Self-pay | Admitting: Emergency Medicine

## 2015-05-29 ENCOUNTER — Ambulatory Visit (INDEPENDENT_AMBULATORY_CARE_PROVIDER_SITE_OTHER): Payer: Medicare Other | Admitting: Pharmacist Clinician (PhC)/ Clinical Pharmacy Specialist

## 2015-05-29 DIAGNOSIS — I480 Paroxysmal atrial fibrillation: Secondary | ICD-10-CM | POA: Diagnosis not present

## 2015-05-29 DIAGNOSIS — Z7901 Long term (current) use of anticoagulants: Secondary | ICD-10-CM

## 2015-05-29 LAB — POCT INR: INR: 2

## 2015-06-04 ENCOUNTER — Other Ambulatory Visit (HOSPITAL_COMMUNITY): Payer: Self-pay | Admitting: Psychiatry

## 2015-06-04 NOTE — Telephone Encounter (Signed)
Patient was given 90 day supply.  Too soon to refill .

## 2015-06-06 ENCOUNTER — Other Ambulatory Visit: Payer: Self-pay | Admitting: Nurse Practitioner

## 2015-06-07 ENCOUNTER — Other Ambulatory Visit (HOSPITAL_COMMUNITY): Payer: Self-pay | Admitting: Psychiatry

## 2015-06-07 DIAGNOSIS — F0630 Mood disorder due to known physiological condition, unspecified: Secondary | ICD-10-CM

## 2015-06-11 NOTE — Telephone Encounter (Signed)
Medication refill request - patient's appointment for 06/17/15 was moved back to 07/02/15 due to provider being out of town.  Patient requests a refill of Trazodone but now wants 100mg , 1 and 1/2 a night as states she bumped it up 05/27/15.  Patient stated she has not taken 1 and 1/2 every night since 05/27/15 but most nights and was going to discuss this at her appointment.  Patient would like a new 90 day order at 1 and 1/2 a night and agrees to keep newly scheduled appointment for 07/02/15.

## 2015-06-11 NOTE — Telephone Encounter (Signed)
Called patient back to inform Dr. Adele Schilder wanted patient to only take one Trazodone 100mg  at night and not 1 and a half at least until she returns to see him on 07/02/15.  Dr. Adele Schilder did authorize a one time 30 day refill of medication and informed patient this was being [facility] to [facility] on [address].  Patient agreed with plan and will see on 07/02/15.

## 2015-06-17 ENCOUNTER — Ambulatory Visit (HOSPITAL_COMMUNITY): Payer: Self-pay | Admitting: Psychiatry

## 2015-06-21 ENCOUNTER — Encounter: Payer: Self-pay | Admitting: Adult Health

## 2015-06-21 ENCOUNTER — Ambulatory Visit (INDEPENDENT_AMBULATORY_CARE_PROVIDER_SITE_OTHER): Payer: Medicare Other | Admitting: Adult Health

## 2015-06-21 VITALS — BP 112/74 | HR 65 | Temp 98.2°F | Ht 64.0 in | Wt 195.0 lb

## 2015-06-21 DIAGNOSIS — J9611 Chronic respiratory failure with hypoxia: Secondary | ICD-10-CM

## 2015-06-21 DIAGNOSIS — J449 Chronic obstructive pulmonary disease, unspecified: Secondary | ICD-10-CM

## 2015-06-21 MED ORDER — ALBUTEROL SULFATE (2.5 MG/3ML) 0.083% IN NEBU: 2.5000 mg | INHALATION_SOLUTION | Freq: Four times a day (QID) | RESPIRATORY_TRACT | Status: DC | PRN

## 2015-06-21 MED ORDER — DOXYCYCLINE HYCLATE 100 MG PO TABS: 100.0000 mg | ORAL_TABLET | Freq: Two times a day (BID) | ORAL | Status: DC

## 2015-06-21 MED ORDER — BUDESONIDE 0.25 MG/2ML IN SUSP: 0.2500 mg | RESPIRATORY_TRACT | Status: DC

## 2015-06-21 NOTE — Addendum Note (Signed)
Addended by: Osa Craver on: 06/21/2015 10:38 AM   Modules accepted: Orders

## 2015-06-21 NOTE — Assessment & Plan Note (Signed)
Compensated on o2.  

## 2015-06-21 NOTE — Progress Notes (Signed)
   Subjective:    Patient ID: Laurie Horton, female    DOB: [birth year], 69 y.o.   MRN: [id number]  HPI 69 yo female with COPD , small PFO with exertional desats , and chronic A Fib on Coumadin   DNR  06/21/2015 Acute OV : COPD  Pt presents for an acute office visit.  Complains of increased SOB and prod cough with white thick /yellow mucus x 1 week.  Sore throat and wheezing, and left ear pain.  Denies any fever, chills, nausea and vomiting.  No otc meds used.  She is suppose to be on pulmicort and atrovent nebs , only takes pulmicort As needed   Uses albuterol neb As needed  As well.  Both meds are expired (packets are from 2013).  Medication education given.   Says she has been dx with Fibromyalgia in chest wall and abdominal wall.   Denies fever,hemoptysis , edema or orthopnea.   PVX and Prevnar are utd.  Last cxr in 2015 with copd changes.   Remains on O2 with adequate sats.   Review of Systems  Constitutional:   No  weight loss, night sweats,  Fevers, chills, fatigue, or  lassitude.  HEENT:   No headaches,  Difficulty swallowing,  Tooth/dental problems, or  Sore throat,                No sneezing, itching, ear ache, + nasal congestion, post nasal drip,   CV:  No chest pain,  Orthopnea, PND, swelling in lower extremities, anasarca, dizziness, palpitations, syncope.   GI  No heartburn, indigestion, abdominal pain, nausea, vomiting, diarrhea, change in bowel habits, loss of appetite, bloody stools.   Resp:   No chest wall deformity  Skin: no rash or lesions.  GU: no dysuria, change in color of urine, no urgency or frequency.  No flank pain, no hematuria   MS:  No joint pain or swelling.  No decreased range of motion.  No back pain.  Psych:  No change in mood or affect. No depression or anxiety.  No memory loss.    '      Objective:   Physical Exam  GEN: A/Ox3; pleasant , NAD, well nourished , elderly on O2   HEENT:  [place]/AT,  EACs-clear, TMs-wnl, NOSE-clear,  THROAT-clear, no lesions, no postnasal drip or exudate noted.   NECK:  Supple w/ fair ROM; no JVD; normal carotid impulses w/o bruits; no thyromegaly or nodules palpated; no lymphadenopathy.  RESP  Clear  P & A; w/o, wheezes/ rales/ or rhonchi.no accessory muscle use, no dullness to percussion  CARD:  RRR, no m/r/g  , no peripheral edema, pulses intact, no cyanosis or clubbing.  GI:   Soft & nt; nml bowel sounds; no organomegaly or masses detected.  Musco: Warm bil, no deformities or joint swelling noted.   Neuro: alert, no focal deficits noted.    Skin: Warm, no lesions or rashes         Assessment & Plan:

## 2015-06-21 NOTE — Patient Instructions (Addendum)
Doxycycline 100mg  Twice daily  , take with food May cause sunburn, wear protective clothing and sunscreen.  Mucinex DM Twice daily  As needed  Cough  Restart Pulmicort Neb Twice daily  .  follow up Dr. [name] in 3 months and As needed   Please contact office for sooner follow up if symptoms do not improve or worsen or seek emergency care

## 2015-06-21 NOTE — Assessment & Plan Note (Signed)
Exacerbation   Plan  Doxycycline 100mg  Twice daily  , take with food May cause sunburn, wear protective clothing and sunscreen.  Mucinex DM Twice daily  As needed  Cough  Restart Pulmicort Neb Twice daily  .  follow up Dr. [name] in 3 months and As needed   Please contact office for sooner follow up if symptoms do not improve or worsen or seek emergency care    Notified to alert Coumadin clinic that new abx started

## 2015-06-27 ENCOUNTER — Ambulatory Visit (INDEPENDENT_AMBULATORY_CARE_PROVIDER_SITE_OTHER): Payer: Medicare Other | Admitting: Pharmacist Clinician (PhC)/ Clinical Pharmacy Specialist

## 2015-06-27 ENCOUNTER — Encounter: Payer: Self-pay | Admitting: Cardiology

## 2015-06-27 ENCOUNTER — Ambulatory Visit (INDEPENDENT_AMBULATORY_CARE_PROVIDER_SITE_OTHER): Payer: Medicare Other | Admitting: Cardiology

## 2015-06-27 ENCOUNTER — Ambulatory Visit: Payer: Self-pay | Admitting: Cardiology

## 2015-06-27 VITALS — BP 170/80 | HR 99 | Ht 64.0 in | Wt 193.1 lb

## 2015-06-27 DIAGNOSIS — Q211 Atrial septal defect: Secondary | ICD-10-CM

## 2015-06-27 DIAGNOSIS — Q2111 Secundum atrial septal defect: Secondary | ICD-10-CM

## 2015-06-27 DIAGNOSIS — I10 Essential (primary) hypertension: Secondary | ICD-10-CM

## 2015-06-27 DIAGNOSIS — J449 Chronic obstructive pulmonary disease, unspecified: Secondary | ICD-10-CM

## 2015-06-27 DIAGNOSIS — Z7901 Long term (current) use of anticoagulants: Secondary | ICD-10-CM

## 2015-06-27 DIAGNOSIS — I480 Paroxysmal atrial fibrillation: Secondary | ICD-10-CM

## 2015-06-27 LAB — POCT INR: INR: 2.7

## 2015-06-27 NOTE — Patient Instructions (Signed)
Continue your current therapy  I will see you in 6 months.   

## 2015-06-27 NOTE — Progress Notes (Signed)
[name] Date of Birth: January 06, 1946 Medical Record #[id number]  History of Present Illness: Laurie Horton is seen for follow up atrial fibrillation. She has a history of  abnormal ECG, severe COPD, stroke after knee surgery in 2006. Evaluation at that time revealed a PFO by TEE. She was previously maintained on aspirin therapy. In December 2015 she was noted to have more paroxysmal Afib. She was anticoagulated and Toprol dose was increased to 25 mg. She wore an event monitor that showed infrequent runs of Afib.  On follow up today she reports she is sick with bronchitis. Saw pulmonary last week and is on inhaler therapy and doxycycline. States she is bringing up more phlegm. Inhalers make her feel shaky and heart rate is faster. Is concerned about sore place on right leg.   Current Outpatient Prescriptions  Medication Sig Dispense Refill  . albuterol (PROVENTIL) (2.5 MG/3ML) 0.083% nebulizer solution Take 3 mLs (2.5 mg total) by nebulization every 6 (six) hours as needed for wheezing or shortness of breath. 75 mL 5  . albuterol (VENTOLIN HFA) 108 (90 BASE) MCG/ACT inhaler Inhale 2 puffs into the lungs every 4 (four) hours as needed for wheezing (rescue inhaler). 8.5 Inhaler 5  . budesonide (PULMICORT) 0.25 MG/2ML nebulizer solution Take 2 mLs (0.25 mg total) by nebulization every 4 (four) hours. 60 mL 5  . chlorhexidine (PERIDEX) 0.12 % solution Use as directed 15 mLs in the mouth or throat 2 (two) times daily.    . clindamycin (CLEOCIN) 300 MG capsule Take 600 mg by mouth. One hour prior to dental surgery    . desvenlafaxine (PRISTIQ) 50 MG 24 hr tablet Take 1 tablet (50 mg total) by mouth daily. 30 tablet 2  . diazepam (VALIUM) 2 MG tablet Take 1 tablet (2 mg total) by mouth as needed for anxiety. (Patient taking differently: Take 2 mg by mouth daily as needed for anxiety. ) 30 tablet 0  . doxycycline (VIBRA-TABS) 100 MG tablet Take 1 tablet (100 mg total) by mouth 2 (two) times daily. 14  tablet 0  . HYDROcodone-acetaminophen (NORCO/VICODIN) 5-325 MG per tablet Take 1 tablet by mouth 2 (two) times daily.    Marland Kitchen ipratropium (ATROVENT) 0.02 % nebulizer solution Take 500 mcg by nebulization every 4 (four) hours as needed (thightness).     . montelukast (SINGULAIR) 10 MG tablet TAKE 1 TABLET AT BEDTIME (Patient taking differently: Take one tablet by mouth daily in the morning.) 30 tablet 2  . NORVASC 5 MG tablet TAKE 1 TABLET BY MOUTH EVERY DAY. 30 tablet 2  . ondansetron (ZOFRAN-ODT) 4 MG disintegrating tablet Take 1 tablet (4 mg total) by mouth every 8 (eight) hours as needed for nausea or vomiting. 20 tablet 0  . oxybutynin (DITROPAN-XL) 10 MG 24 hr tablet Take 10 mg by mouth every morning.   3  . OXYGEN Inhale 1.5 L into the lungs. For naps and sleep.    . pantoprazole (PROTONIX) 40 MG tablet Take 1 tablet by mouth every morning.     . polyethylene glycol (MIRALAX / GLYCOLAX) packet Take 17 g by mouth daily as needed for mild constipation.     . pregabalin (LYRICA) 50 MG capsule Take 50 mg by mouth 2 (two) times daily. ta    . tiZANidine (ZANAFLEX) 4 MG tablet Take 1 mg by mouth daily as needed for muscle spasms.     . TOPROL XL 25 MG 24 hr tablet Take 25 mg by mouth every morning.  4  . traZODone (DESYREL) 100 MG tablet TAKE 1 TABLET (100 MG TOTAL) BY MOUTH AT BEDTIME. 30 tablet 0  . warfarin (COUMADIN) 5 MG tablet Take 1 tablet (5 mg total) by mouth daily. (Patient taking differently: Take 2.5-5 mg by mouth daily. Takes one tablet (5 mg) on Mon, Wed, and Friday and half a tablet on all other days.) 30 tablet 2   No current facility-administered medications for this visit.    Allergies  Allergen Reactions  . Alprazolam     REACTION: stops breathing  . Pseudoephedrine Hcl Er Shortness Of Breath  . Budesonide-Formoterol Fumarate     bliusters  . Aciphex [Rabeprazole Sodium]     rash  . Avelox [Moxifloxacin Hcl In Nacl]     Stomach cramps.   . Esomeprazole Magnesium      REACTION: "bouncing off walls"  . Iodine     REACTION: swelling in throat  . Loratadine     claritin D causes shaking  . Lotrimin [Clotrimazole] Other (See Comments)    blisters  . Lunesta [Eszopiclone]     REACTION: "slept for a week"  . Other     Glue from ekg/heart monitor leads --rash +  Any MYCINS  . Oxcarbazepine Other (See Comments)    Causes deep sleep and dizziness  . Zolpidem Tartrate     REACTION: "slept for a week"  . Bextra [Valdecoxib] Rash  . Ceclor [Cefaclor] Rash  . Covera-Hs [Verapamil Hcl] Palpitations  . Dicyclomine Hcl Rash  . Tessalon Perles Rash    Past Medical History  Diagnosis Date  . Depression   . GERD (gastroesophageal reflux disease)   . HTN (hypertension)   . Hiatal hernia   . COPD (chronic obstructive pulmonary disease)   . Diverticulitis   . DVT (deep venous thrombosis)   . Obesity   . Hyperlipidemia   . PFO (patent foramen ovale)   . PAT (paroxysmal atrial tachycardia)   . Right middle lobe pneumonia 07/24/2011    First noted at admit 07/10/11. Persists on cxr 07/22/11. Cleared on CT 08/24/11. No further followup  . PULMONARY NODULE, LEFT LOWER LOBE 10/14/2009    87mm LLL nodule dec 2010. Stable and 74mm in Oct 2012. No further fu  . TOBACCO ABUSE 06/04/2009  . Osteoporosis   . Fibromyalgia   . Stroke   . History of deviated nasal septum     left- side  . On supplemental oxygen therapy     concentrator at night @ 1.5 l/m or when sleeps. O2 Sat niormally 87.  . Complication of anesthesia     various issues with oxygen  saturations post op    Past Surgical History  Procedure Laterality Date  . Total abdominal hysterectomy      post op needed oxygen was told "she gave them a scare"  . Knee arthroscopy  2000    left  . Laparoscopic cholecystectomy  04-16-2010    [facility]  . Tubal ligation    . US echocardiography  11/20/2009    EF 55-60%  . Cardiovascular stress test  12/26/2004    EF 74%. NO EVIDENCE OF ISCHEMIA  . Mouth surgery        03-26-15 multiple extractions stitches remains  . Esophagogastroduodenoscopy (egd) with propofol N/A 04/15/2015    Procedure: ESOPHAGOGASTRODUODENOSCOPY (EGD) WITH PROPOFOL;  Surgeon: Laurence Spates, MD;  Location: [facility];  Service: Endoscopy;  Laterality: N/A;    History  Smoking status  . Former Smoker  . Quit  date: 06/02/2010  Smokeless tobacco  . Not on file    History  Alcohol Use No    Family History  Problem Relation Age of Onset  . Dementia Mother   . Diabetes Mother   . Alzheimer's disease Mother   . Heart attack Brother 22  . Schizophrenia Sister   . Diabetes Sister   . Tremor Sister   . Heart attack Father     Review of Systems: The review of systems is per the HPI.  All other systems were reviewed and are negative.  Physical Exam: BP 170/80 mmHg  Pulse 99  Ht 5\' 4"  (1.626 m)  Wt 87.59 kg (193 lb 1.6 oz)  BMI 33.13 kg/m2 Patient is obese, alert and in no acute distress.  Skin is warm and dry. Color is normal.  HEENT is unremarkable. Normocephalic/atraumatic. PERRL. Sclera are nonicteric. Neck is supple. No masses. No JVD. Lungs with few rhonchi. No gallop or murmur.  Abdomen is soft. Extremities are without edema. There is a small bruise with tenderness medial right calf. Gait and ROM are intact. No gross neurologic deficits noted.  Wt Readings from Last 3 Encounters:  06/27/15 87.59 kg (193 lb 1.6 oz)  06/21/15 88.451 kg (195 lb)  04/15/15 87.091 kg (192 lb)    LABORATORY DATA/PROCEDURES:    Lab Results  Component Value Date   WBC 7.5 12/06/2014   HGB 13.7 12/06/2014   HCT 39.7 12/06/2014   PLT 217 12/06/2014   GLUCOSE 97 12/06/2014   CHOL 183 05/04/2013   TRIG 137 05/04/2013   HDL 37* 05/04/2013   LDLCALC 119* 05/04/2013   ALT 14 12/06/2014   AST 11 12/06/2014   NA 136 12/06/2014   K 4.5 12/06/2014   CL 98 12/06/2014   CREATININE 0.91 12/06/2014   BUN 19 12/06/2014   CO2 31 12/06/2014   TSH 2.82 10/15/2014   INR 2.7 06/27/2015    HGBA1C 5.8* 05/04/2013    BNP (last 3 results)  Recent Labs  09/19/14 1056  PROBNP 129.0*   Lexiscan Myoview Impression from July 2014 Exercise Capacity: [place] with no exercise. BP Response: Normal blood pressure response. Clinical Symptoms: Dizzy ECG Impression: No significant ST segment change suggestive of ischemia. Comparison with Prior Nuclear Study: No images to compare  Overall Impression: Normal stress nuclear study.  LV Ejection Fraction: 80%. LV Wall Motion: NL LV Function; NL Wall Motion  [place]  Event monitor tracings were personally reviewed in detail. She had copious recordings. The predominant rhythm is NSR with frequent PACs. She does have several short bursts of PAT and atrial fibrillation. The longest episode lasting 18 seconds. The episodes the patient recorded do not consistently correlate with AFib.    Echo 10/17/14: Study Conclusions  - Left ventricle: The cavity size was normal. There was mild concentric hypertrophy. Systolic function was normal. The estimated ejection fraction was in the range of 60% to 65%. Wall motion was normal; there were no regional wall motion abnormalities. Doppler parameters are consistent with abnormal left ventricular relaxation (grade 1 diastolic dysfunction). - Aorta: Aortic root dimension: 44 mm (ED). - Ascending aorta: The ascending aorta was mildly dilated. - Left atrium: The atrium was mildly dilated.  Assessment / Plan: 1. PAF -would not be surprised if she has more symptoms with beta agonists. Continue toprol for rate control. INR 2.7 today. Recommend continuing current therapy. By her history she will convert on her own.   2. PFO - on anticoagulation. Stay off ASA.  3. COPD with recent exacerbation with bronchitis.  4. Hypertension -  Elevated today but has been under very good control. Will monitor.   5. Aortic root enlargement. 44 mm by Echo.   6. Right calf bruise. Not concerned  about DVT on coumadin.

## 2015-07-02 ENCOUNTER — Other Ambulatory Visit: Payer: Self-pay | Admitting: Cardiology

## 2015-07-02 ENCOUNTER — Ambulatory Visit (INDEPENDENT_AMBULATORY_CARE_PROVIDER_SITE_OTHER): Payer: Medicare Other | Admitting: Psychiatry

## 2015-07-02 ENCOUNTER — Encounter (HOSPITAL_COMMUNITY): Payer: Self-pay | Admitting: Psychiatry

## 2015-07-02 VITALS — BP 146/84 | HR 98 | Ht 64.0 in | Wt 192.4 lb

## 2015-07-02 DIAGNOSIS — F4310 Post-traumatic stress disorder, unspecified: Secondary | ICD-10-CM | POA: Diagnosis not present

## 2015-07-02 DIAGNOSIS — F0630 Mood disorder due to known physiological condition, unspecified: Secondary | ICD-10-CM

## 2015-07-02 DIAGNOSIS — F419 Anxiety disorder, unspecified: Secondary | ICD-10-CM

## 2015-07-02 DIAGNOSIS — F329 Major depressive disorder, single episode, unspecified: Secondary | ICD-10-CM | POA: Diagnosis not present

## 2015-07-02 MED ORDER — TRAZODONE HCL 150 MG PO TABS: 150.0000 mg | ORAL_TABLET | Freq: Every day | ORAL | Status: DC

## 2015-07-02 MED ORDER — DESVENLAFAXINE SUCCINATE ER 50 MG PO TB24: 50.0000 mg | ORAL_TABLET | Freq: Every day | ORAL | Status: DC

## 2015-07-02 NOTE — Progress Notes (Addendum)
[facility] [phone] Progress Note   Laurie Horton [id number] 69 y.o.  07/02/2015  10:04 AM  Chief Complaint:  I am under a lot of stress.  I may get sued from a stranger.  I cannot sleep.  I think my blood pressure is up.  I have dizzy spells.      History of Present Illness:  Laurie Horton came for her followup appointment.  She is complaining of increased stress and anxiety.  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  She is afraid that she may lose her town home.  She admitted poor sleep, racing thoughts, crying spells.  So far she has not received any legal notice but she is trying to get in touch with her attorney in case she needed legal help.  She admitted recently her blood pressure is high.  She took Valium few weeks ago few weeks ago to calm her down.  She see her cardiologist but no changes done.  She also saw a pulmonologist and she was given albuterol for bronchitis which she finished.  She's not taking any inhaler because of shakes and causing blister in her mouth.  She scheduled to see her primary care physician on September 7.  She denies any paranoia or any hallucination but admitted her nightmares are getting worse.  She denies suicidal parts or homicidal thought.  She denies any aggressive behavior.  She endorse her energy level is low .  She does not go to the grocery store because she is very anxious and nervous.  Her appetite is okay.  She lives by herself however her daughter lives close by.  I offer counseling but patient refused.  Suicidal Ideation: No Plan Formed: No Patient has means to carry out plan: No  Homicidal Ideation: No Plan Formed: No Patient has means to carry out plan: No  Past Psychiatric History/Hospitalization(s) Patient denies any previous history of psychiatric inpatient treatment, suicidal attempt, paranoia, hallucination or any mania.  She started seeing  psychiatrist in 2006 after she had a stroke.  She had tried Cymbalta and Celexa in the past.  She also tried Ambien, Lunesta and Xanax but developed allergies and side effects. Anxiety: Yes Bipolar Disorder: No Depression: Yes Mania: No Psychosis: No Schizophrenia: No Personality Disorder: No Hospitalization for psychiatric illness: No History of Electroconvulsive Shock Therapy: No Prior Suicide Attempts: No  Medical History; Patient has hypertension, fibromyalgia, scoliosis, headaches and history of stroke.  She has some weakness on her right hand which is minimal.  She also have loss of peripheral vision.  Her primary care physician is Dr. Everlene Horton.  Substance Abuse History; Patient denies any history of substance use.   Review of Systems  Constitutional: Negative.   HENT: Negative.   Cardiovascular: Negative for chest pain and palpitations.  Musculoskeletal: Positive for back pain and joint pain.  Skin: Negative.   Neurological: Positive for dizziness. Negative for tingling and tremors.  Psychiatric/Behavioral: Positive for depression. The patient is nervous/anxious and has insomnia.    Psychiatric: Agitation: No Hallucination: No Depressed Mood: No Insomnia: No Hypersomnia: No Altered Concentration: No Feels Worthless: No Grandiose Ideas: No Belief In Special Powers: No New/Increased Substance Abuse: No Compulsions: No  Neurologic: Headache: Yes Seizure: No Paresthesias: No   Musculoskeletal: Strength & Muscle Tone: Patient endorsed some weakness in her right side but it is very minimal. Gait & Station: She has some difficulty  in walking because of right-sided weakness. Patient leans: Front   Outpatient Encounter Prescriptions as of 07/02/2015  Medication Sig  . HYDROcodone-acetaminophen (NORCO/VICODIN) 5-325 MG per tablet Take 1 tablet by mouth 2 (two) times daily.  . montelukast (SINGULAIR) 10 MG tablet TAKE 1 TABLET AT BEDTIME (Patient taking differently:  Take one tablet by mouth daily in the morning.)  . NORVASC 5 MG tablet TAKE 1 TABLET BY MOUTH EVERY DAY.  Marland Kitchen oxybutynin (DITROPAN-XL) 10 MG 24 hr tablet Take 10 mg by mouth every morning.   . OXYGEN Inhale 1.5 L into the lungs. For naps and sleep.  . pantoprazole (PROTONIX) 40 MG tablet Take 1 tablet by mouth every morning.   . polyethylene glycol (MIRALAX / GLYCOLAX) packet Take 17 g by mouth daily as needed for mild constipation.   . pregabalin (LYRICA) 50 MG capsule Take 50 mg by mouth 2 (two) times daily. ta  . tiZANidine (ZANAFLEX) 4 MG tablet Take 1 mg by mouth daily as needed for muscle spasms.   . TOPROL XL 25 MG 24 hr tablet Take 25 mg by mouth every morning.   . traZODone (DESYREL) 150 MG tablet Take 1 tablet (150 mg total) by mouth at bedtime.  Marland Kitchen warfarin (COUMADIN) 5 MG tablet Take 1 tablet (5 mg total) by mouth daily. (Patient taking differently: Take 2.5-5 mg by mouth daily. Takes one tablet (5 mg) on Mon, Wed, and Friday and half a tablet on all other days.)  . [DISCONTINUED] traZODone (DESYREL) 100 MG tablet TAKE 1 TABLET (100 MG TOTAL) BY MOUTH AT BEDTIME.  Marland Kitchen albuterol (PROVENTIL) (2.5 MG/3ML) 0.083% nebulizer solution Take 3 mLs (2.5 mg total) by nebulization every 6 (six) hours as needed for wheezing or shortness of breath. (Patient not taking: Reported on 07/02/2015)  . albuterol (VENTOLIN HFA) 108 (90 BASE) MCG/ACT inhaler Inhale 2 puffs into the lungs every 4 (four) hours as needed for wheezing (rescue inhaler). (Patient not taking: Reported on 07/02/2015)  . budesonide (PULMICORT) 0.25 MG/2ML nebulizer solution Take 2 mLs (0.25 mg total) by nebulization every 4 (four) hours. (Patient not taking: Reported on 07/02/2015)  . clindamycin (CLEOCIN) 300 MG capsule Take 600 mg by mouth. One hour prior to dental surgery  . desvenlafaxine (PRISTIQ) 50 MG 24 hr tablet Take 1 tablet (50 mg total) by mouth daily.  . diazepam (VALIUM) 2 MG tablet Take 1 tablet (2 mg total) by mouth as needed  for anxiety. (Patient taking differently: Take 2 mg by mouth daily as needed for anxiety. )  . doxycycline (VIBRA-TABS) 100 MG tablet Take 1 tablet (100 mg total) by mouth 2 (two) times daily. (Patient not taking: Reported on 07/02/2015)  . ondansetron (ZOFRAN-ODT) 4 MG disintegrating tablet Take 1 tablet (4 mg total) by mouth every 8 (eight) hours as needed for nausea or vomiting. (Patient not taking: Reported on 07/02/2015)  . [DISCONTINUED] desvenlafaxine (PRISTIQ) 50 MG 24 hr tablet Take 1 tablet (50 mg total) by mouth daily.   No facility-administered encounter medications on file as of 07/02/2015.    Recent Results (from the past 2160 hour(s))  POCT INR     Status: None   Collection Time: 04/03/15 11:52 AM  Result Value Ref Range   INR 3.5   POCT INR     Status: None   Collection Time: 05/01/15  8:09 AM  Result Value Ref Range   INR 2.4   POCT INR     Status: None   Collection Time: 05/29/15  8:03  AM  Result Value Ref Range   INR 2   POCT INR     Status: None   Collection Time: 06/27/15  8:39 AM  Result Value Ref Range   INR 2.7       Constitutional:  BP 146/84 mmHg  Pulse 98  Ht 5\' 4"  (1.626 m)  Wt 192 lb 6.4 oz (87.272 kg)  BMI 33.01 kg/m2   Mental Status Examination;  Patient is casually dressed and fairly groomed.  She is anxious but cooperative.  She described her mood sad anxious and depressed and her affect is constricted.  Her speech is fast , clear and coherent.  She denies any auditory or visual hallucination.  She denies any active or passive suicidal thoughts or homicidal thoughts.  There are no delusions, paranoia or any obsessive thoughts although she has a lot of ruminative thoughts.  Her attention and concentration is fair.  Her psychomotor activity is slightly increased.  There were no flight of ideas or any loose association.  Her thought process is circumstantial.  Her fund of knowledge is adequate.  She is alert and oriented x3.  Her cognition is grossly  intact.  Her insight judgment and impulse control is okay.   Established Problem, Stable/Improving (1), Review of Psycho-Social Stressors (1), Review or order clinical lab tests (1), Established Problem, Worsening (2), Review of Last Therapy Session (1), Review of Medication Regimen & Side Effects (2) and Review of New Medication or Change in Dosage (2)  Assessment: Axis I:  Depression , Anxiety disorder NOS, Mood disorder due to general medical condition, posttraumatic stress disorder.  Axis II: Deferred  Axis III:  Past Medical History  Diagnosis Date  . Depression   . GERD (gastroesophageal reflux disease)   . HTN (hypertension)   . Hiatal hernia   . COPD (chronic obstructive pulmonary disease)   . Diverticulitis   . DVT (deep venous thrombosis)   . Obesity   . Hyperlipidemia   . PFO (patent foramen ovale)   . PAT (paroxysmal atrial tachycardia)   . Right middle lobe pneumonia 07/24/2011    First noted at admit 07/10/11. Persists on cxr 07/22/11. Cleared on CT 08/24/11. No further followup  . PULMONARY NODULE, LEFT LOWER LOBE 10/14/2009    40mm LLL nodule dec 2010. Stable and 99mm in Oct 2012. No further fu  . TOBACCO ABUSE 06/04/2009  . Osteoporosis   . Fibromyalgia   . Stroke   . History of deviated nasal septum     left- side  . On supplemental oxygen therapy     concentrator at night @ 1.5 l/m or when sleeps. O2 Sat niormally 87.  . Complication of anesthesia     various issues with oxygen  saturations post op    Plan:  Patient is experiencing increased anxiety and nervousness with poor sleep due to recent psychosocial stressors.  Reassurance given.  Review her current medication and recent blood work results.  She is no longer taking antibiotic and inhaler.  She scheduled to see her primary care physician on September 7.  I talk about high blood pressure readings and encouraged to contact her cardiologist if she continues to have high blood pressure reading and dizziness.  At  this time she does not have any other associated symptoms including chest pain, palpitation or shortness of breath.  I will increase trazodone 150 mg at bedtime to help her sleep .  Patient does not want to increase her Pristiq dose at this time.  I will continue Pristiq 50 mg daily.  She is using Valium 2 mg as needed and she still has refill remaining.  Discussed medication side effects and benefits.  One more time I encouraged to see therapist but patient refused.  Discuss safety plan that anytime having active suicidal thoughts or homicidal thoughts and she need to call 911 or go to the local emergency room.  Follow-up in 2 months.  Shota Kohrs T., MD 07/02/2015

## 2015-07-03 ENCOUNTER — Other Ambulatory Visit: Payer: Self-pay | Admitting: Emergency Medicine

## 2015-07-11 ENCOUNTER — Ambulatory Visit (INDEPENDENT_AMBULATORY_CARE_PROVIDER_SITE_OTHER): Payer: Medicare Other | Admitting: Emergency Medicine

## 2015-07-11 ENCOUNTER — Encounter: Payer: Self-pay | Admitting: Emergency Medicine

## 2015-07-11 ENCOUNTER — Other Ambulatory Visit: Payer: Self-pay | Admitting: Emergency Medicine

## 2015-07-11 VITALS — BP 148/77 | HR 68 | Temp 98.0°F | Resp 16 | Ht 64.0 in | Wt 195.6 lb

## 2015-07-11 DIAGNOSIS — M797 Fibromyalgia: Secondary | ICD-10-CM | POA: Diagnosis not present

## 2015-07-11 DIAGNOSIS — Z1159 Encounter for screening for other viral diseases: Secondary | ICD-10-CM

## 2015-07-11 DIAGNOSIS — E785 Hyperlipidemia, unspecified: Secondary | ICD-10-CM

## 2015-07-11 DIAGNOSIS — I482 Chronic atrial fibrillation, unspecified: Secondary | ICD-10-CM

## 2015-07-11 DIAGNOSIS — K117 Disturbances of salivary secretion: Secondary | ICD-10-CM | POA: Diagnosis not present

## 2015-07-11 DIAGNOSIS — J449 Chronic obstructive pulmonary disease, unspecified: Secondary | ICD-10-CM | POA: Diagnosis not present

## 2015-07-11 DIAGNOSIS — G9389 Other specified disorders of brain: Secondary | ICD-10-CM | POA: Diagnosis not present

## 2015-07-11 DIAGNOSIS — Z114 Encounter for screening for human immunodeficiency virus [HIV]: Secondary | ICD-10-CM

## 2015-07-11 LAB — LIPID PANEL
CHOL/HDL RATIO: 6.1 ratio — AB (ref <=5.0)
CHOLESTEROL: 237 mg/dL — AB (ref 125–200)
HDL: 39 mg/dL — ABNORMAL LOW (ref >=46)
LDL Cholesterol: 162 mg/dL — ABNORMAL HIGH (ref <130)
Triglycerides: 182 mg/dL — ABNORMAL HIGH (ref <150)
VLDL: 36 mg/dL — ABNORMAL HIGH (ref <30)

## 2015-07-11 LAB — COMPLETE METABOLIC PANEL WITH GFR
ALBUMIN: 4.1 g/dL (ref 3.6–5.1)
ALT: 14 U/L (ref 6–29)
AST: 14 U/L (ref 10–35)
Alkaline Phosphatase: 61 U/L (ref 33–130)
BUN: 14 mg/dL (ref 7–25)
CHLORIDE: 102 mmol/L (ref 98–110)
CO2: 26 mmol/L (ref 20–31)
CREATININE: 0.93 mg/dL (ref 0.50–0.99)
Calcium: 9.4 mg/dL (ref 8.6–10.4)
GFR, EST AFRICAN AMERICAN: 73 mL/min (ref >=60)
GFR, Est Non African American: 63 mL/min (ref >=60)
Glucose, Bld: 115 mg/dL — ABNORMAL HIGH (ref 65–99)
POTASSIUM: 4 mmol/L (ref 3.5–5.3)
Sodium: 137 mmol/L (ref 135–146)
Total Bilirubin: 0.5 mg/dL (ref 0.2–1.2)
Total Protein: 6.2 g/dL (ref 6.1–8.1)

## 2015-07-11 LAB — CBC WITH DIFFERENTIAL/PLATELET
Basophils Absolute: 0 10*3/uL (ref 0.0–0.1)
Basophils Relative: 0 % (ref 0–1)
EOS PCT: 2 % (ref 0–5)
Eosinophils Absolute: 0.1 10*3/uL (ref 0.0–0.7)
HEMATOCRIT: 39.6 % (ref 36.0–46.0)
Hemoglobin: 13.6 g/dL (ref 12.0–15.0)
LYMPHS PCT: 25 % (ref 12–46)
Lymphs Abs: 1.2 10*3/uL (ref 0.7–4.0)
MCH: 31.9 pg (ref 26.0–34.0)
MCHC: 34.3 g/dL (ref 30.0–36.0)
MCV: 93 fL (ref 78.0–100.0)
MONO ABS: 0.5 10*3/uL (ref 0.1–1.0)
MONOS PCT: 10 % (ref 3–12)
MPV: 11.1 fL (ref 8.6–12.4)
Neutro Abs: 3 10*3/uL (ref 1.7–7.7)
Neutrophils Relative %: 63 % (ref 43–77)
Platelets: 196 10*3/uL (ref 150–400)
RBC: 4.26 MIL/uL (ref 3.87–5.11)
RDW: 13.6 % (ref 11.5–15.5)
WBC: 4.7 10*3/uL (ref 4.0–10.5)

## 2015-07-11 LAB — HIV ANTIBODY (ROUTINE TESTING W REFLEX): HIV: NONREACTIVE

## 2015-07-11 LAB — HEPATITIS C ANTIBODY: HCV AB: NEGATIVE

## 2015-07-11 NOTE — Progress Notes (Signed)
Patient ID: Laurie Horton, female   DOB: 1946-02-02, 69 y.o.   MRN: [id number]     This chart was scribed for Arlyss Queen, MD by Zola Button, Medical Scribe. This patient was seen in room 21 and the patient's care was started at 8:09 AM.   Chief Complaint:  Chief Complaint  Patient presents with  . Hypertension  . COPD  . Fibromyalgia  . Depression    HPI: Laurie Horton is a 69 y.o. female with a history of COPD, PAF, hypertension, fibromyalgia, and depression who reports to [facility] today complaining of for a follow-up.   Patient saw Rexene Edison, NP at [facility] about 3 weeks ago for bronchitis and was started on Mucinex for 10 days as well as doxycycline. She stopped her albuterol. Patient is on 1.5 L O2 only when sleeping and with exertion. She notes that she has still been drooling. She saw Dr. Martinique 2 weeks ago for her atrial fibrillation and was continued on Toprol.  Patient still has generalized myalgias due to her fibromyalgia.  Past Medical History  Diagnosis Date  . Depression   . GERD (gastroesophageal reflux disease)   . HTN (hypertension)   . Hiatal hernia   . COPD (chronic obstructive pulmonary disease)   . Diverticulitis   . DVT (deep venous thrombosis)   . Obesity   . Hyperlipidemia   . PFO (patent foramen ovale)   . PAT (paroxysmal atrial tachycardia)   . Right middle lobe pneumonia 07/24/2011    First noted at admit 07/10/11. Persists on cxr 07/22/11. Cleared on CT 08/24/11. No further followup  . PULMONARY NODULE, LEFT LOWER LOBE 10/14/2009    18mm LLL nodule dec 2010. Stable and 49mm in Oct 2012. No further fu  . TOBACCO ABUSE 06/04/2009  . Osteoporosis   . Fibromyalgia   . Stroke   . History of deviated nasal septum     left- side  . On supplemental oxygen therapy     concentrator at night @ 1.5 l/m or when sleeps. O2 Sat niormally 87.  . Complication of anesthesia     various issues with oxygen  saturations post op   Past Surgical History    Procedure Laterality Date  . Total abdominal hysterectomy      post op needed oxygen was told "she gave them a scare"  . Knee arthroscopy  2000    left  . Laparoscopic cholecystectomy  04-16-2010    [facility]  . Tubal ligation    . US echocardiography  11/20/2009    EF 55-60%  . Cardiovascular stress test  12/26/2004    EF 74%. NO EVIDENCE OF ISCHEMIA  . Mouth surgery      03-26-15 multiple extractions stitches remains  . Esophagogastroduodenoscopy (egd) with propofol N/A 04/15/2015    Procedure: ESOPHAGOGASTRODUODENOSCOPY (EGD) WITH PROPOFOL;  Surgeon: Laurence Spates, MD;  Location: [facility];  Service: Endoscopy;  Laterality: N/A;   Social History   Social History  . Marital Status: Divorced    Spouse Name: N/A  . Number of Children: 2  . Years of Education: N/A   Occupational History  . disability    Social History Main Topics  . Smoking status: Former Smoker    Quit date: 06/02/2010  . Smokeless tobacco: None  . Alcohol Use: No  . Drug Use: No  . Sexual Activity: Yes   Other Topics Concern  . None   Social History Narrative   Family History  Problem Relation Age  of Onset  . Dementia Mother   . Diabetes Mother   . Alzheimer's disease Mother   . Heart attack Brother 90  . Schizophrenia Sister   . Diabetes Sister   . Tremor Sister   . Heart attack Father    Allergies  Allergen Reactions  . Alprazolam     REACTION: stops breathing  . Pseudoephedrine Hcl Er Shortness Of Breath  . Budesonide-Formoterol Fumarate     bliusters  . Aciphex [Rabeprazole Sodium]     rash  . Avelox [Moxifloxacin Hcl In Nacl]     Stomach cramps.   . Esomeprazole Magnesium     REACTION: "bouncing off walls"  . Iodine     REACTION: swelling in throat  . Loratadine     claritin D causes shaking  . Lotrimin [Clotrimazole] Other (See Comments)    blisters  . Lunesta [Eszopiclone]     REACTION: "slept for a week"  . Other     Glue from ekg/heart monitor leads --rash +  Any  MYCINS  . Oxcarbazepine Other (See Comments)    Causes deep sleep and dizziness  . Zolpidem Tartrate     REACTION: "slept for a week"  . Bextra [Valdecoxib] Rash  . Ceclor [Cefaclor] Rash  . Covera-Hs [Verapamil Hcl] Palpitations  . Dicyclomine Hcl Rash  . Tessalon Perles Rash   Prior to Admission medications   Medication Sig Start Date End Date Taking? Authorizing Provider  albuterol (PROVENTIL) (2.5 MG/3ML) 0.083% nebulizer solution Take 3 mLs (2.5 mg total) by nebulization every 6 (six) hours as needed for wheezing or shortness of breath. 06/21/15  Yes Tammy S Parrett, NP  albuterol (VENTOLIN HFA) 108 (90 BASE) MCG/ACT inhaler Inhale 2 puffs into the lungs every 4 (four) hours as needed for wheezing (rescue inhaler). 04/02/15  Yes [name], MD  budesonide (PULMICORT) 0.25 MG/2ML nebulizer solution Take 2 mLs (0.25 mg total) by nebulization every 4 (four) hours. 06/21/15  Yes Tammy S Parrett, NP  clindamycin (CLEOCIN) 300 MG capsule Take 600 mg by mouth. One hour prior to dental surgery   Yes Historical Provider, MD  desvenlafaxine (PRISTIQ) 50 MG 24 hr tablet Take 1 tablet (50 mg total) by mouth daily. 07/02/15  Yes Kathlee Nations, MD  diazepam (VALIUM) 2 MG tablet Take 1 tablet (2 mg total) by mouth as needed for anxiety. Patient taking differently: Take 2 mg by mouth daily as needed for anxiety.  12/20/14  Yes Kathlee Nations, MD  HYDROcodone-acetaminophen (NORCO/VICODIN) 5-325 MG per tablet Take 1 tablet by mouth 2 (two) times daily.   Yes Historical Provider, MD  montelukast (SINGULAIR) 10 MG tablet TAKE 1 TABLET AT BEDTIME 07/03/15  Yes Chelle Jeffery, PA-C  NORVASC 5 MG tablet TAKE 1 TABLET BY MOUTH EVERY DAY. 05/23/15  Yes [name], MD  ondansetron (ZOFRAN-ODT) 4 MG disintegrating tablet Take 1 tablet (4 mg total) by mouth every 8 (eight) hours as needed for nausea or vomiting. 11/06/14  Yes [name], MD  oxybutynin (DITROPAN-XL) 10 MG 24 hr tablet Take 10 mg by mouth every  morning.  11/28/14  Yes Historical Provider, MD  OXYGEN Inhale 1.5 L into the lungs. For naps and sleep.   Yes Historical Provider, MD  pantoprazole (PROTONIX) 40 MG tablet Take 1 tablet by mouth every morning.  07/24/13  Yes Historical Provider, MD  polyethylene glycol (MIRALAX / GLYCOLAX) packet Take 17 g by mouth daily as needed for mild constipation.    Yes  Historical Provider, MD  pregabalin (LYRICA) 50 MG capsule Take 50 mg by mouth 2 (two) times daily. ta 08/03/14  Yes Historical Provider, MD  tiZANidine (ZANAFLEX) 4 MG tablet Take 1 mg by mouth daily as needed for muscle spasms.    Yes Historical Provider, MD  TOPROL XL 25 MG 24 hr tablet Take 25 mg by mouth every morning.  03/10/15  Yes Historical Provider, MD  traZODone (DESYREL) 150 MG tablet Take 1 tablet (150 mg total) by mouth at bedtime. 07/02/15  Yes Kathlee Nations, MD  warfarin (COUMADIN) 5 MG tablet TAKE 1 TABLET (5 MG TOTAL) BY MOUTH DAILY. 07/02/15  Yes Peter M Martinique, MD     ROS: The patient denies fevers, chills, night sweats, unintentional weight loss, nausea, vomiting, dysuria, hematuria, melena.   All other systems have been reviewed and were otherwise negative with the exception of those mentioned in the HPI and as above.    PHYSICAL EXAM: Filed Vitals:   07/11/15 0756  BP: 148/77  Pulse: 68  Temp: 98 F (36.7 C)  Resp: 16   Body mass index is 33.56 kg/(m^2).   General: Alert, no acute distress HEENT:  Normocephalic, atraumatic, oropharynx patent. Eye: Juliette Mangle [facility] Cardiovascular:  Regular rate and rhythm, no rubs murmurs or gallops.  No Carotid bruits, radial pulse intact. No pedal edema. Repeat blood pressure: 124/86. Respiratory: Clear to auscultation bilaterally.  No wheezes, rales, or rhonchi.  No cyanosis, no use of accessory musculature Abdominal: No organomegaly, abdomen is soft and non-tender, positive bowel sounds.  No masses. Musculoskeletal: Gait intact. No edema, tenderness Skin: No  rashes. Neurologic: Facial musculature symmetric. Psychiatric: Patient acts appropriately throughout our interaction. Lymphatic: No cervical or submandibular lymphadenopathy Genitourinary/Anorectal: No acute findings    LABS:    EKG/XRAY:   Primary read interpreted by Dr. Everlene Farrier at [facility].   ASSESSMENT/PLAN: Patient is stable at present. She continues to battle with depression. Her fibroma myalgias to the problem. Lung why she has been stable. Pulse ox in our office was 88 which is routine for her. No major changes made in medications.I personally performed the services described in this documentation, which was scribed in my presence. The recorded information has been reviewed and is accurate.    Gross sideeffects, risk and benefits, and alternatives of medications d/w patient. Patient is aware that all medications have potential sideeffects and we are unable to predict every sideeffect or drug-drug interaction that may occur.  Arlyss Queen MD 07/11/2015 8:23 AM

## 2015-07-12 ENCOUNTER — Encounter: Payer: Self-pay | Admitting: Family Medicine

## 2015-07-29 ENCOUNTER — Ambulatory Visit (INDEPENDENT_AMBULATORY_CARE_PROVIDER_SITE_OTHER): Payer: Medicare Other | Admitting: Pharmacist Clinician (PhC)/ Clinical Pharmacy Specialist

## 2015-07-29 DIAGNOSIS — Z7901 Long term (current) use of anticoagulants: Secondary | ICD-10-CM

## 2015-07-29 DIAGNOSIS — I480 Paroxysmal atrial fibrillation: Secondary | ICD-10-CM

## 2015-07-29 LAB — POCT INR: INR: 2.7

## 2015-08-02 ENCOUNTER — Other Ambulatory Visit: Payer: Self-pay | Admitting: Cardiology

## 2015-08-06 ENCOUNTER — Encounter: Payer: Self-pay | Admitting: Emergency Medicine

## 2015-08-12 ENCOUNTER — Ambulatory Visit: Payer: Medicare Other | Admitting: Neurology

## 2015-08-14 ENCOUNTER — Encounter: Payer: Self-pay | Admitting: Neurology

## 2015-08-14 ENCOUNTER — Telehealth: Payer: Self-pay | Admitting: Neurology

## 2015-08-14 ENCOUNTER — Ambulatory Visit (INDEPENDENT_AMBULATORY_CARE_PROVIDER_SITE_OTHER): Payer: Medicare Other | Admitting: Neurology

## 2015-08-14 VITALS — BP 139/99 | HR 86 | Ht 64.0 in | Wt 193.6 lb

## 2015-08-14 DIAGNOSIS — K117 Disturbances of salivary secretion: Secondary | ICD-10-CM

## 2015-08-14 DIAGNOSIS — G3184 Mild cognitive impairment, so stated: Secondary | ICD-10-CM

## 2015-08-14 DIAGNOSIS — R413 Other amnesia: Secondary | ICD-10-CM | POA: Insufficient documentation

## 2015-08-14 NOTE — Patient Instructions (Signed)
I had a long discussion with the patient regarding her complaints of drooling and memory loss. I reassured her that on my exam and did not find any signs to worry about Parkinson's disease. She does have some residual right lower facial weakness from prior stroke and she had recent dental surgery and perhaps that may be the reason why she is drooling. I recommend we evaluate her for treatable causes of memory loss but checking dementia panel labs, EEG and MRI scan of the brain. I encouraged her to increase participation in activities for cognitive stimulation like solving crossword puzzles, sudoku and playing bridge. Continue warfarin for stroke prevention given recent diagnosis of atrial fibrillation. Strict control of hypertension with blood pressure goal below 130/90 and lipids with LDL cholesterol goal below 70 mg percent. Return for follow-up in 2 months or call earlier if necessary. Management of Memory Problems  There are some general things you can do to help manage your memory problems.  Your memory may not in fact recover, but by using techniques and strategies you will be able to manage your memory difficulties better.  1)  Establish a routine.  Try to establish and then stick to a regular routine.  By doing this, you will get used to what to expect and you will reduce the need to rely on your memory.  Also, try to do things at the same time of day, such as taking your medication or checking your calendar first thing in the morning.  Think about think that you can do as a part of a regular routine and make a list.  Then enter them into a daily planner to remind you.  This will help you establish a routine.  2)  Organize your environment.  Organize your environment so that it is uncluttered.  Decrease visual stimulation.  Place everyday items such as keys or cell phone in the same place every day (ie.  Basket next to front door)  Use post it notes with a brief message to yourself (ie. Turn  off light, lock the door)  Use labels to indicate where things go (ie. Which cupboards are for food, dishes, etc.)  Keep a notepad and pen by the telephone to take messages  3)  Memory Aids  A diary or journal/notebook/daily planner  Making a list (shopping list, chore list, to do list that needs to be done)  Using an alarm as a reminder (kitchen timer or cell phone alarm)  Using cell phone to store information (Notes, Calendar, Reminders)  Calendar/White board placed in a prominent position  Post-it notes  In order for memory aids to be useful, you need to have good habits.  It's no good remembering to make a note in your journal if you don't remember to look in it.  Try setting aside a certain time of day to look in journal.  4)  Improving mood and managing fatigue.  There may be other factors that contribute to memory difficulties.  Factors, such as anxiety, depression and tiredness can affect memory.  Regular gentle exercise can help improve your mood and give you more energy.  Simple relaxation techniques may help relieve symptoms of anxiety  Try to get back to completing activities or hobbies you enjoyed doing in the past.  Learn to pace yourself through activities to decrease fatigue.  Find out about some local support groups where you can share experiences with others.  Try and achieve 7-8 hours of sleep at night. Mild Neurocognitive Disorder  Mild neurocognitive disorder (formerly known as mild cognitive impairment) is a mental disorder. It is a slight abnormal decrease in mental function. The areas of mental function affected may include memory, thought, communication, behavior, and completion of tasks. The decrease is noticeable and measurable but for the most part does not interfere with your daily activities. Mild neurocognitive disorder typically occurs in people older than 60 years but can occur earlier. It is not as serious as major neurocognitive disorder  (formerly known as dementia) but may lead to a more serious neurocognitive disorder. However, in some cases the condition does not get worse. A few people with this disorder even improve. CAUSES  There are a number of different causes of mild neurocognitive disorder:  Brain disorders associated with abnormal protein deposits, such as Alzheimer's disease, Pick's disease, and Lewy body disease. Brain disorders associated with abnormal movement, such as Parkinson's disease and Huntington's disease. Diseases affecting blood vessels in the brain and resulting in mini-strokes. Certain infections, such as human immunodeficiency virus (HIV) infection. Traumatic brain injury. Other medical conditions such as brain tumors, underactive thyroid (hypothyroidism), and vitamin B12 deficiency. Use of certain prescription medicine and "recreational" drugs. SYMPTOMS  Symptoms of mild neurocognitive disorder include: Difficulty remembering. You may forget details of recent events, names, or phone numbers. You may forget important social events and appointments or repeatedly forget where you put your car keys. Difficulty thinking and solving problems. You may have trouble with complex tasks such as paying bills or driving in unfamiliar locations. Difficulty communicating. You may have trouble finding the right word, naming an object, forming a sentence that makes sense, or understanding what you read or hear. Changes in your behavior or personality. You may lose interest in the things that you used to enjoy or withdraw from social situations. You may get angry more easily than usual. You may act before thinking. You may do things in public that you would not usually do. You may hear or see things that are not real (hallucinations). You may believe falsely that others are trying to hurt you (paranoia). DIAGNOSIS Mild neurocognitive disorder is diagnosed through an assessment by your health care provider. Your health  care provider will ask you and your family, friends, or coworkers questions about your symptoms. He or she will ask how often the symptoms occur, how long they have been occurring, whether they are getting worse, and the effect they are having on your life. Your health care provider may refer you to a neurologist or mental health specialist for a detailed evaluation of your mental functions (neuropsychological testing).  To identify the cause of your mild neurocognitive disorder, your health care provider may: Obtain a detailed medical history. Ask about alcohol and drug use, including prescription medicine. Perform a physical exam. Order blood tests and brain imaging exams. TREATMENT  Mild neurocognitive disorder caused by infections, use of certain medicines or "recreational" drugs, and certain medical conditions may improve with treatment of the condition that is causing the disorder. Mild neurocognitive disorder resulting from other causes generally does not improve and may worsen. In these cases, the goal of treatment is to slow progression of the disorder and help you cope with the loss of mental function. Treatments in these cases include:  Medicine. Medicine helps mainly with memory loss and behavioral symptoms.  Talk therapy. Talk therapy provides education, emotional support, memory aids, and other ways of making up for decreases in mental function.  Lifestyle changes. These include regular exercise, a healthy diet (  including essential omega-3 fatty acids), intellectual stimulation, and increased social interaction.   This information is not intended to replace advice given to you by your health care provider. Make sure you discuss any questions you have with your health care provider.   Document Released: 06/21/2013 Document Revised: 11/09/2014 Document Reviewed: 06/21/2013 Elsevier Interactive Patient Education [employer].

## 2015-08-14 NOTE — Progress Notes (Signed)
[facility] [address] [place]. [place] [address] [phone]       OFFICE CONSULT NOTE  Laurie. Laurie Horton Date of Birth:  December 04, 1945 Medical Record Number:  [id number]   Referring MD:  Arlyss Queen  Reason for Referral:  Drooling and memory loss  HPI: Laurie Horton is a 69 year old Caucasian lady who has noted increased drooling from the right corner of the mouth for about 3 months. She also has noticed that she loses her train of thoughts and he has become forgetful to take her medications and forgets what she is doing. This has been going on for a year but is more noticeable recently. She denies any headache, focal extremity weakness, gait balance problems. 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 Recently she was found to have atrial fibrillation upon Holter monitoring and was initially started on Xarelto which she could not afford and now has been changed to warfarin which is tolerating without bleeding or bruising. She does have residual right-sided visual field loss and some weakness of the right lower face and intrinsic hand muscles on the right from her prior stroke. She admits her balance is poor but she is not had any major falls. She also has chronic fibromyalgia and chronic back pain for which he sees Dr. Estanislado Pandy. He denies any significant falls, head injury headache. She's not had any recent brain imaging study done. She denies any bradykinesia, change in handwriting or gait festination. She admits she recently had tooth removed 3 months ago but denies persistent pain or infection in her teeth.  ROS:   14 system review of systems is positive for  weight gain, chest pain, blurred vision, easy bruising, incontinence, joint swelling, aching muscles, memory loss, headache, slurred  speech, dizziness, depression, anxiety, decreased energy, racing thoughts PMH:  Past Medical History  Diagnosis Date  . Depression   . GERD (gastroesophageal reflux disease)   . HTN (hypertension)   . Hiatal hernia   . COPD (chronic obstructive pulmonary disease) ([place])   . Diverticulitis   . DVT (deep venous thrombosis) ([place])   . Obesity   . Hyperlipidemia   . PFO (patent foramen ovale)   . PAT (paroxysmal atrial tachycardia) ([place])   . Right middle lobe pneumonia 07/24/2011    First noted at admit 07/10/11. Persists on cxr 07/22/11. Cleared on CT 08/24/11. No further followup  . PULMONARY NODULE, LEFT LOWER LOBE 10/14/2009    38mm LLL nodule dec 2010. Stable and 69mm in Oct 2012. No further fu  . TOBACCO ABUSE 06/04/2009  . Osteoporosis   . Fibromyalgia   . Stroke ([place])   . History of deviated nasal septum     left- side  . On supplemental oxygen therapy     concentrator at night @ 1.5 l/m or when sleeps. O2 Sat niormally 87.  . Complication of anesthesia     various issues with oxygen  saturations post op    Social History:  Social History   Social History  . Marital Status: Divorced    Spouse Name: N/A  . Number of Children: 2  . Years of Education: N/A   Occupational History  . disability    Social History Main Topics  . Smoking status: Former Smoker    Quit date: 06/02/2010  . Smokeless tobacco:  Not on file  . Alcohol Use: No  . Drug Use: No  . Sexual Activity: Yes   Other Topics Concern  . Not on file   Social History Narrative    Medications:   Current Outpatient Prescriptions on File Prior to Visit  Medication Sig Dispense Refill  . albuterol (PROVENTIL) (2.5 MG/3ML) 0.083% nebulizer solution Take 3 mLs (2.5 mg total) by nebulization every 6 (six) hours as needed for wheezing or shortness of breath. 75 mL 5  . albuterol (VENTOLIN HFA) 108 (90 BASE) MCG/ACT inhaler Inhale 2 puffs into the lungs every 4 (four) hours as needed for wheezing (rescue inhaler).  8.5 Inhaler 5  . budesonide (PULMICORT) 0.25 MG/2ML nebulizer solution Take 2 mLs (0.25 mg total) by nebulization every 4 (four) hours. 60 mL 5  . desvenlafaxine (PRISTIQ) 50 MG 24 hr tablet Take 1 tablet (50 mg total) by mouth daily. 30 tablet 1  . diazepam (VALIUM) 2 MG tablet Take 1 tablet (2 mg total) by mouth as needed for anxiety. (Patient taking differently: Take 2 mg by mouth daily as needed for anxiety. ) 30 tablet 0  . HYDROcodone-acetaminophen (NORCO/VICODIN) 5-325 MG per tablet Take 1 tablet by mouth 2 (two) times daily.    . montelukast (SINGULAIR) 10 MG tablet TAKE 1 TABLET AT BEDTIME 30 tablet 2  . NORVASC 5 MG tablet TAKE 1 TABLET BY MOUTH EVERY DAY. 30 tablet 2  . ondansetron (ZOFRAN-ODT) 4 MG disintegrating tablet Take 1 tablet (4 mg total) by mouth every 8 (eight) hours as needed for nausea or vomiting. 20 tablet 0  . oxybutynin (DITROPAN-XL) 10 MG 24 hr tablet Take 10 mg by mouth every morning.   3  . OXYGEN Inhale 1.5 L into the lungs. For naps and sleep.    . pantoprazole (PROTONIX) 40 MG tablet Take 1 tablet by mouth every morning.     . polyethylene glycol (MIRALAX / GLYCOLAX) packet Take 17 g by mouth daily as needed for mild constipation.     . pregabalin (LYRICA) 50 MG capsule Take 50 mg by mouth 2 (two) times daily. ta    . tiZANidine (ZANAFLEX) 4 MG tablet Take 1 mg by mouth daily as needed for muscle spasms.     . TOPROL XL 25 MG 24 hr tablet Take 25 mg by mouth every morning.   4  . traZODone (DESYREL) 150 MG tablet Take 1 tablet (150 mg total) by mouth at bedtime. 30 tablet 1  . warfarin (COUMADIN) 5 MG tablet TAKE 1 TABLET (5 MG TOTAL) BY MOUTH DAILY. 30 tablet 3   No current facility-administered medications on file prior to visit.    Allergies:   Allergies  Allergen Reactions  . Alprazolam     REACTION: stops breathing  . Pseudoephedrine Hcl Er Shortness Of Breath  . Budesonide-Formoterol Fumarate     bliusters  . Aciphex [Rabeprazole Sodium]     rash    . Avelox [Moxifloxacin Hcl In Nacl]     Stomach cramps.   . Esomeprazole Magnesium     REACTION: "bouncing off walls"  . Flonase [Fluticasone Propionate]   . Iodine     REACTION: swelling in throat  . Loratadine     claritin D causes shaking  . Lotrimin [Clotrimazole] Other (See Comments)    blisters  . Lunesta [Eszopiclone]     REACTION: "slept for a week"  . Other     Glue from ekg/heart monitor leads --rash +  Any MYCINS  .  Oxcarbazepine Other (See Comments)    Causes deep sleep and dizziness  . Zolpidem Tartrate     REACTION: "slept for a week"  . Bextra [Valdecoxib] Rash  . Ceclor [Cefaclor] Rash  . Covera-Hs [Verapamil Hcl] Palpitations  . Dicyclomine Hcl Rash  . Tessalon Perles Rash    Physical Exam General: well developed, well nourished elderly Caucasian lady, seated, in no evident distress Head: head normocephalic and atraumatic.   Neck: supple with no carotid or supraclavicular bruits Cardiovascular: regular rate and rhythm, no murmurs Musculoskeletal: no deformity Skin:  no rash/petichiae Vascular:  Normal pulses all extremities  Neurologic Exam Mental Status: Awake and fully alert. Oriented to place and time. Recent and remote memory intact. Attention span, concentration and fund of knowledge appropriate. Mood and affect appropriate. Mini-Mental status exam scored 28/30 with deficits in recall only. Able to name 94 leg and animals. Clock drawing 4/4. Cranial Nerves: Fundoscopic exam reveals sharp disc margins. Pupils equal, briskly reactive to light. Extraocular movements full without nystagmus. Visual fields show dense right homonymous hemianopsia to confrontation. Hearing intact. Mild right lower facial asymmetry and weakness Facial sensation intact. Face, tongue, palate moves normally and symmetrically.  Motor: Normal bulk and tone. Normal strength in all tested extremity muscles. Mild weakness of right grip and increasing hand muscles. Diminished fine finger  movements on the right. Orbits left over right approximately. Sensory.: intact to touch , pinprick , position and vibratory sensation.  Coordination: Rapid alternating movements normal in all extremities. Finger-to-nose and heel-to-shin performed accurately bilaterally. Gait and Station: Arises from chair without difficulty. Stance is normal. Gait demonstrates normal stride length and balance . Able to heel, toe and tandem walk without difficulty.  Reflexes: 1+ and symmetric. Toes downgoing.   NIHSS  3 Modified Rankin  2   ASSESSMENT: 61 year Caucasian lady with recent complaints of drooling from the right corner of the mouth of unclear etiology. She does have residual right lower facial weakness from a previous stroke 10 years ago and had recent dental surgery done which may perhaps explain. She does not have any associated features to suggest Parkinson's disease. She also has complaints of memory loss and mild cognitive impairment which may be is age appropriate but needs further evaluation as well. Remote history of embolic left parieto-occipital infarct in 2006 with unknown  source but h/o small patent foramen ovale. Recent diagnosis of atrial fibrillation    PLAN: I had a long discussion with the patient regarding her complaints of drooling and memory loss. I reassured her that on my exam and did not find any signs to worry about Parkinson's disease. She does have some residual right lower facial weakness from prior stroke and she had recent dental surgery and perhaps that may be the reason why she is drooling. I recommend we evaluate her for treatable causes of memory loss but checking dementia panel labs, EEG and MRI scan of the brain. I encouraged her to increase participation in activities for cognitive stimulation like solving crossword puzzles, sudoku and playing bridge. Continue warfarin for stroke prevention given recent diagnosis of atrial fibrillation. Strict control of hypertension  with blood pressure goal below 130/90 and lipids with LDL cholesterol goal below 70 mg percent. Greater than 50% of time doing this 45 minute consultation was spent on counseling, developing and discussing plan of care and coordination of care .Return for follow-up in 2 months or call earlier if necessary. Antony Contras, MD  Note: This document was prepared with digital dictation  and possible smart Company secretary. Any transcriptional errors that result from this process are unintentional.

## 2015-08-14 NOTE — Telephone Encounter (Signed)
Patient called to advise she was seen today by Dr. Leonie Man and in her patient visit summary it talks about Parkinson's and patient said "she wasn't here for Parkinson's nor did they discuss Parkinson's. Patient was here for mini strokes. Patient also stated that Dr. Leonie Man was very busy and going from room to room seeing other patients and thinks he may have gotten her mixed up with another patient". Please call patient [phone].

## 2015-08-14 NOTE — Telephone Encounter (Signed)
Rn explain to patient that Dr.Sethi did not say she had Parkinson in his note. Rn stated the in the office not he did not think she had any Parkinson's symptoms from the exam. Patient did state she has some mild memory issues, and a memory test was done today. Pt stated she understood and will come back for the lab work before Friday.

## 2015-08-16 ENCOUNTER — Other Ambulatory Visit (INDEPENDENT_AMBULATORY_CARE_PROVIDER_SITE_OTHER): Payer: Self-pay

## 2015-08-16 DIAGNOSIS — Z0289 Encounter for other administrative examinations: Secondary | ICD-10-CM

## 2015-08-18 ENCOUNTER — Other Ambulatory Visit: Payer: Self-pay | Admitting: Emergency Medicine

## 2015-08-18 LAB — DEMENTIA PANEL
Homocysteine: 14.1 umol/L (ref 0.0–15.0)
RPR: NONREACTIVE
TSH: 1.96 u[IU]/mL (ref 0.450–4.500)
VITAMIN B 12: 395 pg/mL (ref 211–946)

## 2015-08-20 LAB — DEMENTIA PANEL

## 2015-08-23 ENCOUNTER — Ambulatory Visit
Admission: RE | Admit: 2015-08-23 | Discharge: 2015-08-23 | Disposition: A | Discharge status: Home / Self Care | Payer: Medicare Other | Source: Ambulatory Visit | Attending: Emergency Medicine | Admitting: Emergency Medicine

## 2015-08-23 DIAGNOSIS — G9389 Other specified disorders of brain: Secondary | ICD-10-CM

## 2015-08-23 DIAGNOSIS — K117 Disturbances of salivary secretion: Secondary | ICD-10-CM

## 2015-08-25 ENCOUNTER — Other Ambulatory Visit: Payer: Self-pay | Admitting: Emergency Medicine

## 2015-08-26 ENCOUNTER — Ambulatory Visit (INDEPENDENT_AMBULATORY_CARE_PROVIDER_SITE_OTHER): Payer: Medicare Other | Admitting: Pharmacist Clinician (PhC)/ Clinical Pharmacy Specialist

## 2015-08-26 ENCOUNTER — Telehealth: Payer: Self-pay

## 2015-08-26 DIAGNOSIS — I480 Paroxysmal atrial fibrillation: Secondary | ICD-10-CM | POA: Diagnosis not present

## 2015-08-26 DIAGNOSIS — Z7901 Long term (current) use of anticoagulants: Secondary | ICD-10-CM

## 2015-08-26 LAB — POCT INR: INR: 2.7

## 2015-08-26 MED ORDER — WARFARIN SODIUM 5 MG PO TABS: ORAL_TABLET | ORAL | Status: DC

## 2015-08-26 NOTE — Telephone Encounter (Signed)
-----   Message from Garvin Fila, MD sent at 08/23/2015  7:05 PM EDT ----- Laurie Horton inform the patient had vitamin B12, thyroid hormone, syphilis blood test were all normal

## 2015-08-26 NOTE — Telephone Encounter (Signed)
Rn call patient to notify her that the vitamin b 12, thyroid hormone, and syphilis test were all normal. Pt verbalized understanding of the test results.

## 2015-08-31 ENCOUNTER — Ambulatory Visit
Admission: RE | Admit: 2015-08-31 | Discharge: 2015-08-31 | Disposition: A | Discharge status: Home / Self Care | Payer: Medicare Other | Source: Ambulatory Visit | Attending: Neurology | Admitting: Neurology

## 2015-08-31 DIAGNOSIS — R413 Other amnesia: Secondary | ICD-10-CM

## 2015-09-03 ENCOUNTER — Encounter (HOSPITAL_COMMUNITY): Payer: Self-pay | Admitting: Psychiatry

## 2015-09-03 ENCOUNTER — Ambulatory Visit (INDEPENDENT_AMBULATORY_CARE_PROVIDER_SITE_OTHER): Payer: Medicare Other | Admitting: Psychiatry

## 2015-09-03 VITALS — BP 144/89 | HR 96 | Ht 64.0 in | Wt 194.6 lb

## 2015-09-03 DIAGNOSIS — F0630 Mood disorder due to known physiological condition, unspecified: Secondary | ICD-10-CM

## 2015-09-03 DIAGNOSIS — F419 Anxiety disorder, unspecified: Secondary | ICD-10-CM | POA: Diagnosis not present

## 2015-09-03 DIAGNOSIS — F329 Major depressive disorder, single episode, unspecified: Secondary | ICD-10-CM

## 2015-09-03 MED ORDER — DESVENLAFAXINE SUCCINATE ER 50 MG PO TB24: 50.0000 mg | ORAL_TABLET | Freq: Every day | ORAL | Status: DC

## 2015-09-03 MED ORDER — DIAZEPAM 2 MG PO TABS: 2.0000 mg | ORAL_TABLET | ORAL | Status: DC | PRN

## 2015-09-03 NOTE — Progress Notes (Signed)
[place] [phone] Progress Note   Laurie Horton [id number] 69 y.o.  09/03/2015  11:45 AM  Chief Complaint:  I am feeling better.  I cut down trazodone 100 mg because 150 was making him too sleepy and groggy.       History of Present Illness:  Laurie Horton came for her followup appointment.  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  She sleeping better.  Recently she's seen neurologist for memory impairment and she has dementia workup.  She is scheduled to see neurologist to discuss results in few days.  Overall she described her mood is a stable.  She sleeping better however she does not feel that she need a higher dose of trazodone.  She tried 150 but it makes her very sleepy and groggy.  She is taking 100 mg only.  She still have issues with her blood pressure and she is concerned about her physical health.  She denies any crying spells, irritability, agitation.  She denies any paranoia or any hallucination.  Her energy level is okay.  She wants to continue her current psychiatric medications.  She denies any nightmares or any flashback.  She has taken few times Valium and now she needed new prescription.  Her last prescription was filled in February.  Patient denies drinking or using any illegal substances.  She is not interested in counseling.  She lives by herself however her daughter lives close by.  Suicidal Ideation: No Plan Formed: No Patient has means to carry out plan: No  Homicidal Ideation: No Plan Formed: No Patient has means to carry out plan: No  Past Psychiatric History/Hospitalization(s) Patient denies any previous history of psychiatric inpatient treatment, suicidal attempt, paranoia, hallucination or any mania.  She started seeing psychiatrist in 2006 after she had a stroke.  She had tried Cymbalta and Celexa in the past.  She also tried Ambien, Lunesta and Xanax but developed allergies and  side effects. Anxiety: Yes Bipolar Disorder: No Depression: Yes Mania: No Psychosis: No Schizophrenia: No Personality Disorder: No Hospitalization for psychiatric illness: No History of Electroconvulsive Shock Therapy: No Prior Suicide Attempts: No  Medical History; Patient has hypertension, fibromyalgia, scoliosis, headaches and history of stroke.  She has some weakness on her right hand which is minimal.  She also have loss of peripheral vision.  Her primary care physician is Dr. Everlene Farrier.  Substance Abuse History; Patient denies any history of substance use.   Review of Systems  Constitutional: Negative.   HENT: Negative.   Cardiovascular: Negative for chest pain and palpitations.  Musculoskeletal: Positive for back pain and joint pain.  Skin: Negative.   Neurological: Negative for tingling and tremors.  Psychiatric/Behavioral: The patient is nervous/anxious.    Psychiatric: Agitation: No Hallucination: No Depressed Mood: No Insomnia: No Hypersomnia: No Altered Concentration: No Feels Worthless: No Grandiose Ideas: No Belief In Special Powers: No New/Increased Substance Abuse: No Compulsions: No  Neurologic: Headache: Yes Seizure: No Paresthesias: No   Musculoskeletal: Strength & Muscle Tone: Patient endorsed some weakness in her right side but it is very minimal. Gait & Station: She has some difficulty in walking because of right-sided weakness. Patient leans: Front   Outpatient Encounter Prescriptions as of 09/03/2015  Medication Sig  . albuterol (PROVENTIL) (2.5 MG/3ML) 0.083% nebulizer solution Take 3 mLs (2.5 mg total) by nebulization every 6 (six) hours as needed for wheezing or shortness of breath.  Marland Kitchen albuterol (VENTOLIN  HFA) 108 (90 BASE) MCG/ACT inhaler Inhale 2 puffs into the lungs every 4 (four) hours as needed for wheezing (rescue inhaler).  Marland Kitchen amLODipine (NORVASC) 5 MG tablet TAKE 1 TABLET BY MOUTH EVERY DAY  . budesonide (PULMICORT) 0.25 MG/2ML  nebulizer solution Take 2 mLs (0.25 mg total) by nebulization every 4 (four) hours.  Marland Kitchen desvenlafaxine (PRISTIQ) 50 MG 24 hr tablet Take 1 tablet (50 mg total) by mouth daily.  . diazepam (VALIUM) 2 MG tablet Take 1 tablet (2 mg total) by mouth as needed for anxiety.  . gabapentin (NEURONTIN) 300 MG capsule   . HYDROcodone-acetaminophen (NORCO/VICODIN) 5-325 MG per tablet Take 1 tablet by mouth 2 (two) times daily.  . montelukast (SINGULAIR) 10 MG tablet TAKE 1 TABLET AT BEDTIME  . ondansetron (ZOFRAN-ODT) 4 MG disintegrating tablet Take 1 tablet (4 mg total) by mouth every 8 (eight) hours as needed for nausea or vomiting.  Marland Kitchen oxybutynin (DITROPAN-XL) 10 MG 24 hr tablet Take 10 mg by mouth every morning.   . OXYGEN Inhale 1.5 L into the lungs. For naps and sleep.  . pantoprazole (PROTONIX) 40 MG tablet Take 1 tablet by mouth every morning.   . polyethylene glycol (MIRALAX / GLYCOLAX) packet Take 17 g by mouth daily as needed for mild constipation.   . pregabalin (LYRICA) 50 MG capsule Take 50 mg by mouth 2 (two) times daily. ta  . tiZANidine (ZANAFLEX) 4 MG tablet Take 1 mg by mouth daily as needed for muscle spasms.   . TOPROL XL 25 MG 24 hr tablet Take 25 mg by mouth every morning.   . traZODone (DESYREL) 150 MG tablet Take 1 tablet (150 mg total) by mouth at bedtime.  Marland Kitchen warfarin (COUMADIN) 5 MG tablet Take 1 tablet by mouth daily or as directed by coumadin clinic  . [DISCONTINUED] amLODipine (NORVASC) 5 MG tablet TAKE 1 TABLET BY MOUTH EVERY DAY.  "OFFICE VISIT NEEDED FOR REFILLS"  . [DISCONTINUED] desvenlafaxine (PRISTIQ) 50 MG 24 hr tablet Take 1 tablet (50 mg total) by mouth daily.  . [DISCONTINUED] diazepam (VALIUM) 2 MG tablet Take 1 tablet (2 mg total) by mouth as needed for anxiety. (Patient taking differently: Take 2 mg by mouth daily as needed for anxiety. )   No facility-administered encounter medications on file as of 09/03/2015.    Recent Results (from the past 2160 hour(s))  POCT  INR     Status: None   Collection Time: 06/27/15  8:39 AM  Result Value Ref Range   INR 2.7   CBC with Differential/Platelet     Status: None   Collection Time: 07/11/15  8:28 AM  Result Value Ref Range   WBC 4.7 4.0 - 10.5 K/uL   RBC 4.26 3.87 - 5.11 MIL/uL   Hemoglobin 13.6 12.0 - 15.0 g/dL   HCT 39.6 36.0 - 46.0 %   MCV 93.0 78.0 - 100.0 fL   MCH 31.9 26.0 - 34.0 pg   MCHC 34.3 30.0 - 36.0 g/dL   RDW 13.6 11.5 - 15.5 %   Platelets 196 150 - 400 K/uL   MPV 11.1 8.6 - 12.4 fL   Neutrophils Relative % 63 43 - 77 %   Neutro Abs 3.0 1.7 - 7.7 K/uL   Lymphocytes Relative 25 12 - 46 %   Lymphs Abs 1.2 0.7 - 4.0 K/uL   Monocytes Relative 10 3 - 12 %   Monocytes Absolute 0.5 0.1 - 1.0 K/uL   Eosinophils Relative 2 0 - 5 %  Eosinophils Absolute 0.1 0.0 - 0.7 K/uL   Basophils Relative 0 0 - 1 %   Basophils Absolute 0.0 0.0 - 0.1 K/uL   Smear Review Criteria for review not met   COMPLETE METABOLIC PANEL WITH GFR     Status: Abnormal   Collection Time: 07/11/15  8:28 AM  Result Value Ref Range   Sodium 137 135 - 146 mmol/L   Potassium 4.0 3.5 - 5.3 mmol/L   Chloride 102 98 - 110 mmol/L   CO2 26 20 - 31 mmol/L   Glucose, Bld 115 (H) 65 - 99 mg/dL   BUN 14 7 - 25 mg/dL   Creat 0.93 0.50 - 0.99 mg/dL   Total Bilirubin 0.5 0.2 - 1.2 mg/dL   Alkaline Phosphatase 61 33 - 130 U/L   AST 14 10 - 35 U/L   ALT 14 6 - 29 U/L   Total Protein 6.2 6.1 - 8.1 g/dL   Albumin 4.1 3.6 - 5.1 g/dL   Calcium 9.4 8.6 - 10.4 mg/dL   GFR, Est African American 73 >=60 mL/min   GFR, Est Non African American 63 >=60 mL/min    Comment:   The estimated GFR is a calculation valid for adults (>=31 years old) that uses the CKD-EPI algorithm to adjust for age and sex. It is   not to be used for children, pregnant women, hospitalized patients,    patients on dialysis, or with rapidly changing kidney function. According to the NKDEP, eGFR >89 is normal, 60-89 shows mild impairment, 30-59 shows moderate  impairment, 15-29 shows severe impairment and <15 is ESRD.     Hepatitis C antibody     Status: None   Collection Time: 07/11/15  8:28 AM  Result Value Ref Range   HCV Ab NEGATIVE NEGATIVE  Lipid panel     Status: Abnormal   Collection Time: 07/11/15  8:28 AM  Result Value Ref Range   Cholesterol 237 (H) 125 - 200 mg/dL   Triglycerides 182 (H) <150 mg/dL   HDL 39 (L) >=46 mg/dL   Total CHOL/HDL Ratio 6.1 (H) <=5.0 Ratio   VLDL 36 (H) <30 mg/dL   LDL Cholesterol 162 (H) <130 mg/dL    Comment:   Total Cholesterol/HDL Ratio:CHD Risk                        Coronary Heart Disease Risk Table                                        Men       Women          1/2 Average Risk              3.4        3.3              Average Risk              5.0        4.4           2X Average Risk              9.6        7.1           3X Average Risk             23.4  11.0 Use the calculated Patient Ratio above and the CHD Risk table  to determine the patient's CHD Risk.   HIV antibody     Status: None   Collection Time: 07/11/15  8:28 AM  Result Value Ref Range   HIV 1&2 Ab, 4th Generation NONREACTIVE NONREACTIVE    Comment:   HIV-1 antigen and HIV-1/HIV-2 antibodies were not detected.  There is no laboratory evidence of HIV infection.   HIV-1/2 Antibody Diff        Not indicated. HIV-1 RNA, Qual TMA          Not indicated.     PLEASE NOTE: This information has been disclosed to you from records whose confidentiality may be protected by state law. If your state requires such protection, then the state law prohibits you from making any further disclosure of the information without the specific written consent of the person to whom it pertains, or as otherwise permitted by law. A general authorization for the release of medical or other information is NOT sufficient for this purpose.   The performance of this assay has not been clinically validated in patients less than 60 years old.   For  additional information please refer to http://education.[facility].com/faq/FAQ106.  (This link is being provided for informational/educational purposes only.)     Footnotes:  (1) ** Please no te change in unit of measure and reference range(s). **     POCT INR     Status: None   Collection Time: 07/29/15  8:11 AM  Result Value Ref Range   INR 2.7   Dementia Panel     Status: None   Collection Time: 08/16/15  9:18 AM  Result Value Ref Range   Vitamin B-12 395 211 - 946 pg/mL   Homocysteine 14.1 0.0 - 15.0 umol/L   TSH 1.960 0.450 - 4.500 uIU/mL   RPR Ser Ql Non Reactive Non Reactive  Dementia Panel     Status: None   Collection Time: 08/16/15  9:18 AM  Result Value Ref Range   Vitamin B-12 CANCELED pg/mL    Comment: Please refer to the following specimen for additional lab results. specimen [id number]  Result canceled by the ancillary   POCT INR     Status: None   Collection Time: 08/26/15  8:06 AM  Result Value Ref Range   INR 2.7       Constitutional:  BP 144/89 mmHg  Pulse 96  Ht _0  (1.626 m)  Wt 194 lb 9.6 oz (88.27 kg)  BMI 33.39 kg/m2   Mental Status Examination;  Patient is casually dressed and fairly groomed.  She is anxious but cooperative.  She described her mood anxious and her affect is appropriate.  Her speech is fast but clear and coherent.  She denies any auditory or visual hallucination.  She denies any active or passive suicidal thoughts or homicidal thoughts.  There are no delusions, paranoia or any obsessive thoughts although she has a lot of ruminative thoughts.  Her attention and concentration is fair.  Her psychomotor activity is slightly increased.  There were no flight of ideas or any loose association.  Her thought process is circumstantial.  Her fund of knowledge is adequate.  She is alert and oriented x3.  Her cognition is grossly intact.  Her insight judgment and impulse control is okay.   Established Problem, Stable/Improving  (1), Review of Psycho-Social Stressors (1), Review of Last Therapy Session (1) and Review of New Medication or Change in Dosage (2)  Assessment: Axis I:  Depression , Anxiety disorder NOS, Mood disorder due to general medical condition, posttraumatic stress disorder.  Axis II: Deferred  Axis III:  Past Medical History  Diagnosis Date  . Depression   . GERD (gastroesophageal reflux disease)   . HTN (hypertension)   . Hiatal hernia   . COPD (chronic obstructive pulmonary disease) ([place])   . Diverticulitis   . DVT (deep venous thrombosis) ([place])   . Obesity   . Hyperlipidemia   . PFO (patent foramen ovale)   . PAT (paroxysmal atrial tachycardia) ([place])   . Right middle lobe pneumonia 07/24/2011    First noted at admit 07/10/11. Persists on cxr 07/22/11. Cleared on CT 08/24/11. No further followup  . PULMONARY NODULE, LEFT LOWER LOBE 10/14/2009    57m LLL nodule dec 2010. Stable and 463min Oct 2012. No further fu  . TOBACCO ABUSE 06/04/2009  . Osteoporosis   . Fibromyalgia   . [place]  . History of deviated nasal septum     left- side  . On supplemental oxygen therapy     concentrator at night @ 1.5 l/m or when sleeps. O2 Sat niormally 87.  . Complication of anesthesia     various issues with oxygen  saturations post op    Plan:  Patient is a stable on her current psychiatric medication.  She wants to continue trazodone 100 mg at bedtime.  She also need a new prescription of Valium 2 mg which she take only for severe panic and anxiety attack.  Continue Pristiq 50 mg daily.  Discussed medication side effects and benefits.  Encouraged to keep appointment with a neurologist and primary care physician for her physical health needs.  Recommended to call [place] if she has any question or any concern.  Follow-up in 3 months.  Tyree Vandruff T., MD 09/03/2015

## 2015-09-04 ENCOUNTER — Ambulatory Visit (INDEPENDENT_AMBULATORY_CARE_PROVIDER_SITE_OTHER): Payer: Medicare Other

## 2015-09-04 DIAGNOSIS — Z23 Encounter for immunization: Secondary | ICD-10-CM | POA: Diagnosis not present

## 2015-09-09 ENCOUNTER — Telehealth: Payer: Self-pay | Admitting: Family Medicine

## 2015-09-09 NOTE — Telephone Encounter (Signed)
SPOKE WITH PATIENT AND SHE HAD HER COLONOSCOPY AROUND 10 YEARS AGO, I WILL CALL DR. Oletta Lamas OFFICE AND GET A COPY OF THE PREVIOUS ONE..  DR. Oletta Lamas OFFICE STATED THAT SHE HAD HER COLONOSCOPY IN 2004.  SHE WILL CALL AND SET UP THE COLONOSCOPY WITH DR. Oletta Lamas.

## 2015-09-10 ENCOUNTER — Other Ambulatory Visit: Payer: Medicare Other

## 2015-09-10 ENCOUNTER — Telehealth: Payer: Self-pay | Admitting: Neurology

## 2015-09-10 NOTE — Telephone Encounter (Signed)
Rn call patient back, to say that Dr.Sethi put the phone note in the MRI result note. Rn stated Dr. Leonie Man will call her back with the results.

## 2015-09-10 NOTE — Telephone Encounter (Signed)
Patient returned Dr. Clydene Fake call. States it was Dr. Leonie Man "it was a man's voice" leaving a message to call Dr. Leonie Man back. Call came in around 1:02-1:04pm.

## 2015-09-11 NOTE — Telephone Encounter (Signed)
I spoke to the patient and gave MRI results showing couple of old strokes but no new, acute or worrisome findings. She was advised to keep scheduled follow-up appointment with me in 2 months

## 2015-09-18 ENCOUNTER — Other Ambulatory Visit: Payer: Medicare Other

## 2015-09-24 ENCOUNTER — Ambulatory Visit: Payer: Medicare Other | Admitting: Internal Medicine

## 2015-10-01 ENCOUNTER — Ambulatory Visit (HOSPITAL_COMMUNITY)
Admission: RE | Admit: 2015-10-01 | Discharge: 2015-10-01 | Disposition: A | Discharge status: Home / Self Care | Payer: Medicare Other | Source: Ambulatory Visit | Attending: Neurology | Admitting: Neurology

## 2015-10-01 DIAGNOSIS — Z8673 Personal history of transient ischemic attack (TIA), and cerebral infarction without residual deficits: Secondary | ICD-10-CM | POA: Diagnosis not present

## 2015-10-01 DIAGNOSIS — R410 Disorientation, unspecified: Secondary | ICD-10-CM

## 2015-10-01 DIAGNOSIS — R413 Other amnesia: Secondary | ICD-10-CM | POA: Diagnosis present

## 2015-10-01 NOTE — Procedures (Signed)
ELECTROENCEPHALOGRAM REPORT   Patient: Laurie Horton        Age: 69 y.o.        Sex: female Referring Physician: Dr Arvin Collard Report Date:  10/01/2015        Interpreting Physician: Hulen Luster  History: Laurie Horton is an 69 y.o. female recent CVA now with recurrent episodes of confusion  Medications:  I have reviewed the patient's current medications.  Conditions of Recording:  This is a 16 channel EEG carried out with the patient in the awake state.  Description:  The waking background activity consists of a low voltage, symmetrical, fairly well organized, 8-9 Hz alpha activity, seen from the parieto-occipital and posterior temporal regions. No focal slowing or epileptiform activity noted.   The patient drowses with slowing to irregular, low voltage theta and beta activity. Hyperventilation was not performed. Intermittent photic stimulation was not performed.  IMPRESSION: Normal electroencephalogram. There are no focal lateralizing or epileptiform features.   Jim Like, DO [facility] [phone]  If 7pm- 7am, please page neurology on call as listed in [place]. 10/01/2015, 11:37 AM

## 2015-10-01 NOTE — Progress Notes (Signed)
EEG Completed; Results Pending  

## 2015-10-03 ENCOUNTER — Telehealth (HOSPITAL_COMMUNITY): Payer: Self-pay

## 2015-10-03 ENCOUNTER — Telehealth: Payer: Self-pay | Admitting: *Deleted

## 2015-10-03 ENCOUNTER — Telehealth: Payer: Self-pay

## 2015-10-03 ENCOUNTER — Other Ambulatory Visit (HOSPITAL_COMMUNITY): Payer: Self-pay | Admitting: Psychiatry

## 2015-10-03 DIAGNOSIS — F0630 Mood disorder due to known physiological condition, unspecified: Secondary | ICD-10-CM

## 2015-10-03 MED ORDER — TRAZODONE HCL 100 MG PO TABS: 100.0000 mg | ORAL_TABLET | Freq: Every day | ORAL | Status: DC

## 2015-10-03 NOTE — Telephone Encounter (Signed)
Met with Dr. Adele Schilder who authorized a needed new Trazodone order but first verified with patient by phone call for Trazodone 100 mg, one at bedtime as patient had cut back to 100 mg at night.  E-scribed in a new 30 day order for Trazodone 111m, one at [facility]time [facility] [address] patient requested with one refill.  Patient returns for next evaluation on 12/04/15.

## 2015-10-03 NOTE — Telephone Encounter (Signed)
-----   Message from Garvin Fila, MD sent at 10/01/2015  4:00 PM EST ----- Kindly inform the patient that EEG study is normal

## 2015-10-03 NOTE — Telephone Encounter (Signed)
I called and gave the normal EEG to pt.  She verbalized understanding.

## 2015-10-03 NOTE — Telephone Encounter (Signed)
Medication refill request for Trazodone.- Patient left a message she was seen by Dr. Adele Schilder on 09/03/15 and continued on Trazodone at night for sleep but no new order was sent to her pharamacy.  Pt. states she is now out and needs a new prescription sent [facility].  Patient returns for next evaluation on 12/04/15.

## 2015-10-03 NOTE — Telephone Encounter (Signed)
Received a call from patient.She stated she wanted Dr.Jordan to know she had a light stroke recently.I will let him know.Stated she is seeing Dr.Sethi.Follow up visit scheduled with Dr.Jordan 12/19/14 at 8:30 am.Advised to call sooner if needed.

## 2015-10-07 ENCOUNTER — Other Ambulatory Visit: Payer: Self-pay | Admitting: Physician Assistant

## 2015-10-07 ENCOUNTER — Ambulatory Visit (INDEPENDENT_AMBULATORY_CARE_PROVIDER_SITE_OTHER): Payer: Medicare Other | Admitting: Pharmacist Clinician (PhC)/ Clinical Pharmacy Specialist

## 2015-10-07 DIAGNOSIS — Z7901 Long term (current) use of anticoagulants: Secondary | ICD-10-CM

## 2015-10-07 DIAGNOSIS — I480 Paroxysmal atrial fibrillation: Secondary | ICD-10-CM

## 2015-10-07 LAB — POCT INR: INR: 2.2

## 2015-10-29 ENCOUNTER — Encounter: Payer: Self-pay | Admitting: Emergency Medicine

## 2015-10-29 ENCOUNTER — Ambulatory Visit (INDEPENDENT_AMBULATORY_CARE_PROVIDER_SITE_OTHER): Payer: Medicare Other | Admitting: Emergency Medicine

## 2015-10-29 VITALS — BP 155/100 | HR 92 | Temp 98.0°F | Resp 16 | Ht 63.0 in | Wt 189.2 lb

## 2015-10-29 DIAGNOSIS — I482 Chronic atrial fibrillation, unspecified: Secondary | ICD-10-CM

## 2015-10-29 DIAGNOSIS — J449 Chronic obstructive pulmonary disease, unspecified: Secondary | ICD-10-CM | POA: Diagnosis not present

## 2015-10-29 DIAGNOSIS — K0889 Other specified disorders of teeth and supporting structures: Secondary | ICD-10-CM | POA: Diagnosis not present

## 2015-10-29 NOTE — Progress Notes (Addendum)
By signing my name below, I, Judithe Modest, attest that this documentation has been prepared under the direction and in the presence of Nena Jordan, MD. Electronically Signed: Judithe Modest, ER Scribe. 10/29/2015. 1:50 PM.  Chief Complaint:  Chief Complaint  Patient presents with  . Follow-up    3 months  labwork  . Depression    answers positive in triage    HPI: Laurie Horton is a 69 y.o. female who reports to [facility] today for a regular checkup. She states she woke up around midnight one week ago, with left-sided facial swelling and severe pain. She then went to see a dentist the next day who prescribed clindamycin and oxycodone for a dental abscess. She was referred to an oral surgeon for tooth extraction. She takes her last dose of clindamycin tomorrow. Her coumadin has been increased to 5mg . Dr. Izora Gala manages her hydrocodone pain medication. She has also been prescribed oxicodone by Dr. Victorino Dike, her dentist, but she is out of that medication and states that hydrocodone does not reduce her tooth pain.   The pt had carotid studies done on 08/23/2015, which showed 50-69% blockage. Copies were forwarded to her neurologist and cardiologist.   Past Medical History  Diagnosis Date  . Depression   . GERD (gastroesophageal reflux disease)   . HTN (hypertension)   . Hiatal hernia   . COPD (chronic obstructive pulmonary disease) ([place])   . Diverticulitis   . DVT (deep venous thrombosis) ([place])   . Obesity   . Hyperlipidemia   . PFO (patent foramen ovale)   . PAT (paroxysmal atrial tachycardia) ([place])   . Right middle lobe pneumonia 07/24/2011    First noted at admit 07/10/11. Persists on cxr 07/22/11. Cleared on CT 08/24/11. No further followup  . PULMONARY NODULE, LEFT LOWER LOBE 10/14/2009    36mm LLL nodule dec 2010. Stable and 12mm in Oct 2012. No further fu  . TOBACCO ABUSE 06/04/2009  . Osteoporosis   . Fibromyalgia   . Stroke ([place])   . History of deviated nasal septum    left- side  . On supplemental oxygen therapy     concentrator at night @ 1.5 l/m or when sleeps. O2 Sat niormally 87.  . Complication of anesthesia     various issues with oxygen  saturations post op   Past Surgical History  Procedure Laterality Date  . Total abdominal hysterectomy      post op needed oxygen was told "she gave them a scare"  . Knee arthroscopy  2000    left  . Laparoscopic cholecystectomy  04-16-2010    [place]  . Tubal ligation    . US echocardiography  11/20/2009    EF 55-60%  . Cardiovascular stress test  12/26/2004    EF 74%. NO EVIDENCE OF ISCHEMIA  . Mouth surgery      03-26-15 multiple extractions stitches remains  . Esophagogastroduodenoscopy (egd) with propofol N/A 04/15/2015    Procedure: ESOPHAGOGASTRODUODENOSCOPY (EGD) WITH PROPOFOL;  Surgeon: Laurence Spates, MD;  Location: [facility];  Service: Endoscopy;  Laterality: N/A;   Social History   Social History  . Marital Status: Divorced    Spouse Name: N/A  . Number of Children: 2  . Years of Education: N/A   Occupational History  . disability    Social History Main Topics  . Smoking status: Former Smoker    Quit date: 06/02/2010  . Smokeless tobacco: None  . Alcohol Use: No  . Drug Use:  No  . Sexual Activity: Yes   Other Topics Concern  . None   Social History Narrative   Family History  Problem Relation Age of Onset  . Dementia Mother   . Diabetes Mother   . Alzheimer's disease Mother   . Heart attack Brother 40  . Schizophrenia Sister   . Diabetes Sister   . Tremor Sister   . Heart attack Father    Allergies  Allergen Reactions  . Alprazolam     REACTION: stops breathing  . Pseudoephedrine Hcl Er Shortness Of Breath  . Budesonide-Formoterol Fumarate     bliusters  . Aciphex [Rabeprazole Sodium]     rash  . Avelox [Moxifloxacin Hcl In Nacl]     Stomach cramps.   . Esomeprazole Magnesium     REACTION: "bouncing off walls"  . Flonase [Fluticasone Propionate]   . Iodine       REACTION: swelling in throat  . Loratadine     claritin D causes shaking  . Lotrimin [Clotrimazole] Other (See Comments)    blisters  . Lunesta [Eszopiclone]     REACTION: "slept for a week"  . Other     Glue from ekg/heart monitor leads --rash +  Any MYCINS  . Oxcarbazepine Other (See Comments)    Causes deep sleep and dizziness  . Zolpidem Tartrate     REACTION: "slept for a week"  . Bextra [Valdecoxib] Rash  . Ceclor [Cefaclor] Rash  . Covera-Hs [Verapamil Hcl] Palpitations  . Dicyclomine Hcl Rash  . Tessalon Perles Rash   Prior to Admission medications   Medication Sig Start Date End Date Taking? Authorizing Provider  albuterol (PROVENTIL) (2.5 MG/3ML) 0.083% nebulizer solution Take 3 mLs (2.5 mg total) by nebulization every 6 (six) hours as needed for wheezing or shortness of breath. 06/21/15   Tammy S Parrett, NP  albuterol (VENTOLIN HFA) 108 (90 BASE) MCG/ACT inhaler Inhale 2 puffs into the lungs every 4 (four) hours as needed for wheezing (rescue inhaler). 04/02/15   [name], MD  amLODipine (NORVASC) 5 MG tablet TAKE 1 TABLET BY MOUTH EVERY DAY 08/25/15   [name], MD  budesonide (PULMICORT) 0.25 MG/2ML nebulizer solution Take 2 mLs (0.25 mg total) by nebulization every 4 (four) hours. 06/21/15   Tammy S Parrett, NP  desvenlafaxine (PRISTIQ) 50 MG 24 hr tablet Take 1 tablet (50 mg total) by mouth daily. 09/03/15   Kathlee Nations, MD  diazepam (VALIUM) 2 MG tablet Take 1 tablet (2 mg total) by mouth as needed for anxiety. 09/03/15   Kathlee Nations, MD  gabapentin (NEURONTIN) 300 MG capsule  08/01/15   Historical Provider, MD  HYDROcodone-acetaminophen (NORCO/VICODIN) 5-325 MG per tablet Take 1 tablet by mouth 2 (two) times daily.    Historical Provider, MD  montelukast (SINGULAIR) 10 MG tablet TAKE 1 TABLET BY MOUTH AT BEDTIME 10/08/15   [name], MD  ondansetron (ZOFRAN-ODT) 4 MG disintegrating tablet Take 1 tablet (4 mg total) by mouth every 8 (eight) hours as  needed for nausea or vomiting. 11/06/14   [name], MD  oxybutynin (DITROPAN-XL) 10 MG 24 hr tablet Take 10 mg by mouth every morning.  11/28/14   Historical Provider, MD  OXYGEN Inhale 1.5 L into the lungs. For naps and sleep.    Historical Provider, MD  pantoprazole (PROTONIX) 40 MG tablet Take 1 tablet by mouth every morning.  07/24/13   Historical Provider, MD  polyethylene glycol (MIRALAX / GLYCOLAX) packet Take  17 g by mouth daily as needed for mild constipation.     Historical Provider, MD  pregabalin (LYRICA) 50 MG capsule Take 50 mg by mouth 2 (two) times daily. ta 08/03/14   Historical Provider, MD  tiZANidine (ZANAFLEX) 4 MG tablet Take 1 mg by mouth daily as needed for muscle spasms.     Historical Provider, MD  TOPROL XL 25 MG 24 hr tablet Take 25 mg by mouth every morning.  03/10/15   Historical Provider, MD  traZODone (DESYREL) 100 MG tablet Take 1 tablet (100 mg total) by mouth at bedtime. 10/03/15   Kathlee Nations, MD  warfarin (COUMADIN) 5 MG tablet Take 1 tablet by mouth daily or as directed by coumadin clinic 08/26/15   Peter M Martinique, MD     ROS: The patient denies fevers, chills, night sweats, unintentional weight loss, chest pain, palpitations, wheezing, dyspnea on exertion, nausea, vomiting, abdominal pain, dysuria, hematuria, melena, numbness, weakness, or tingling.   All other systems have been reviewed and were otherwise negative with the exception of those mentioned in the HPI and as above.    PHYSICAL EXAM: Filed Vitals:   10/29/15 1319 10/29/15 1324  BP: 161/85 155/100  Pulse: 92   Temp: 98 F (36.7 C)   Resp: 16    Body mass index is 33.52 kg/(m^2).   General: Tearful female that seems depressed.  HEENT:  Normocephalic, atraumatic, oropharynx patent. Minimal redness around left upper first premolar.  Eye: [facility], [facility] Cardiovascular:  Irregular rate.  No Carotid bruits, radial pulse intact. No pedal edema.  Respiratory: Chest clear decreased breath sounds  in the bases.  No wheezes, rales, or rhonchi.   Abdominal: No organomegaly, abdomen is soft and non-tender, positive bowel sounds.  No masses. Musculoskeletal: Gait intact. No edema. Cervical tenderness to very light touch Skin: No rashes. Neurologic: Facial musculature symmetric. Psychiatric: Patient acts appropriately throughout our interaction. Lymphatic: No cervical or submandibular lymphadenopathy  BP recheck by Dr. Everlene Farrier: 136/86.  LABS:   EKG/XRAY:   Primary read interpreted by Dr. Everlene Farrier at [facility].   ASSESSMENT/PLAN: Patient seems depressed today. I did repeat her pressure and it was normal. I did not make any medication changes. She has been battling with a tooth infection and swollen glands. She is due to have a dental extraction by oral surgery. Recheck 3 months.I personally performed the services described in this documentation, which was scribed in my presence. The recorded information has been reviewed and is accurate she is currently under pain management .Marland Kitchen She is currently on Coumadin. She has had multiple mini strokes. She does have less than 80% stenosis of the carotids. She is currently under Dr. Clydene Fake care.I personally performed the services described in this documentation, which was scribed in my presence. The recorded information has been reviewed and is accurate.   Gross sideeffects, risk and benefits, and alternatives of medications d/w patient. Patient is aware that all medications have potential sideeffects and we are unable to predict every sideeffect or drug-drug interaction that may occur.  Arlyss Queen MD 10/29/2015 1:30 PM

## 2015-11-17 ENCOUNTER — Telehealth: Payer: Self-pay | Admitting: Family Medicine

## 2015-11-17 ENCOUNTER — Telehealth: Payer: Self-pay | Admitting: Emergency Medicine

## 2015-11-17 NOTE — Telephone Encounter (Signed)
Patient returned phone call about rescheduling her appointment. New appointment is 02/06/2016 at 11:00am.

## 2015-11-17 NOTE — Telephone Encounter (Signed)
Please advise pt to reschedule appt with daub could not leave voice mail

## 2015-11-18 ENCOUNTER — Telehealth: Payer: Self-pay | Admitting: Family Medicine

## 2015-11-18 ENCOUNTER — Ambulatory Visit (INDEPENDENT_AMBULATORY_CARE_PROVIDER_SITE_OTHER): Payer: Medicare Other | Admitting: Pharmacist Clinician (PhC)/ Clinical Pharmacy Specialist

## 2015-11-18 DIAGNOSIS — Z7901 Long term (current) use of anticoagulants: Secondary | ICD-10-CM | POA: Diagnosis not present

## 2015-11-18 DIAGNOSIS — Z1211 Encounter for screening for malignant neoplasm of colon: Secondary | ICD-10-CM

## 2015-11-18 DIAGNOSIS — I480 Paroxysmal atrial fibrillation: Secondary | ICD-10-CM | POA: Diagnosis not present

## 2015-11-18 LAB — POCT INR: INR: 3.8

## 2015-11-18 NOTE — Telephone Encounter (Signed)
SPOKE WITH PATIENT AND SHE HAD COLONOSCOPY IN 2004.  IT IS TIME FOR HER TO GET IT AGAIN   WE WILL HAVE THEM SET IT UP WITH DR Laurence Spates.

## 2015-11-30 ENCOUNTER — Other Ambulatory Visit: Payer: Self-pay | Admitting: Cardiology

## 2015-12-02 ENCOUNTER — Ambulatory Visit (INDEPENDENT_AMBULATORY_CARE_PROVIDER_SITE_OTHER): Payer: Medicare Other | Admitting: Neurology

## 2015-12-02 ENCOUNTER — Other Ambulatory Visit (HOSPITAL_COMMUNITY): Payer: Self-pay | Admitting: Psychiatry

## 2015-12-02 ENCOUNTER — Encounter: Payer: Self-pay | Admitting: Neurology

## 2015-12-02 ENCOUNTER — Encounter: Payer: Self-pay | Admitting: Pharmacist Clinician (PhC)/ Clinical Pharmacy Specialist

## 2015-12-02 VITALS — BP 132/73 | HR 85 | Ht 63.0 in | Wt 194.2 lb

## 2015-12-02 DIAGNOSIS — M6281 Muscle weakness (generalized): Secondary | ICD-10-CM | POA: Diagnosis not present

## 2015-12-02 NOTE — Patient Instructions (Signed)
I had a long discussion with the patient with regards to her drooling particularly when she is tired and some intermittent double vision may merit further evaluation from myasthenia. Recommend check acetylycholine receptor antibody panel and nerve conduction study with rapid repetitive stimulation. She was encouraged to continue part as patient and in cognitively challenging activities to help with her mild cognitive impairment which appears stable. She will return for follow-up in 3 months or call earlier if necessary.

## 2015-12-02 NOTE — Progress Notes (Signed)
[facility] [address] [place]. [place] [address] [phone]       OFFICE FOLLOW UP VISIT NOTE  Laurie. Laurie Horton Date of Birth:  11-05-1945 Medical Record Number:  [id number]   Referring MD:  Arlyss Queen  Reason for Referral:  Drooling and memory loss  HPI: Laurie Horton is a 70 year old Caucasian lady who has noted increased drooling from the right corner of the mouth for about 3 months. She also has noticed that she loses Laurie Horton train of thoughts and he has become forgetful to take Laurie Horton medications and forgets what she is doing. This has been going on for a year but is more noticeable recently. She denies any headache, focal extremity weakness, gait balance problems. 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 Recently she was found to have atrial fibrillation upon Holter monitoring and was initially started on Xarelto which she could not afford and now has been changed to warfarin which is tolerating without bleeding or bruising. She does have residual right-sided visual field loss and some weakness of the right lower face and intrinsic hand muscles on the right from Laurie Horton prior stroke. She admits Laurie Horton balance is poor but she is not had any major falls. She also has chronic fibromyalgia and chronic back pain for which he sees Dr. Estanislado Pandy. He denies any significant falls, head injury headache. She's not had any recent brain imaging study done. She denies any bradykinesia, change in handwriting or gait festination. She admits she recently had tooth removed 3 months ago but denies persistent pain or infection in Laurie Horton teeth. Update 12/02/2015 : She returns for follow-up after last visit 3 months ago. She states that she is now having drooling from both, from the mouth and this is particularly more in the  morning when she gets up. She did have a dental abscess which is been treated last year but symptoms persist. She denies excessive fatigue or tiredness but does agree to some intermittent blurred vision and double vision when she is tired. She states Laurie Horton memory difficulties continue but they're unchanged and are not progressive. She did undergo MRI scan the brain after last visit on 08/2115 which have personally reviewed and shows old left occipital as well as small left cerebellar infarcts as well as mild generalized atrophy. Carotid ultrasound done on 08/23/15 shows mild bilateral 50-69% ICA stenosis. Labs done on 08/16/15 show normal vitamin B12, TSH, RPR and homocysteine levels. EEG done on 11/20 07/15/2015 personally reviewed by me is normal. ROS:   14 system review of systems is positive for  fatigue, drooling, chest tightness, blurred vision, eye redness, palpitations, abdominal pain, constipation, insomnia, frequent waking, incontinence of bladder, easy bruising and bleeding, memory loss, dizziness, headache, speech difficulty, facial drooping, back pain, neck stiffness and all other systems negative  PMH:  Past Medical History  Diagnosis Date  . Depression   . GERD (gastroesophageal reflux disease)   . HTN (hypertension)   . Hiatal hernia   . COPD (chronic obstructive pulmonary disease) ([place])   . Diverticulitis   . DVT (deep venous thrombosis) ([place])   . Obesity   . Hyperlipidemia   . PFO (patent foramen ovale)   . PAT (paroxysmal atrial tachycardia) ([place])   . Right middle lobe pneumonia 07/24/2011    First noted at admit 07/10/11.  Persists on cxr 07/22/11. Cleared on CT 08/24/11. No further followup  . PULMONARY NODULE, LEFT LOWER LOBE 10/14/2009    39mm LLL nodule dec 2010. Stable and 76mm in Oct 2012. No further fu  . TOBACCO ABUSE 06/04/2009  . Osteoporosis   . Fibromyalgia   . Stroke ([place])   . History of deviated nasal septum     left- side  . On supplemental oxygen therapy      concentrator at night @ 1.5 l/m or when sleeps. O2 Sat niormally 87.  . Complication of anesthesia     various issues with oxygen  saturations post op    Social History:  Social History   Social History  . Marital Status: Divorced    Spouse Name: N/A  . Number of Children: 2  . Years of Education: N/A   Occupational History  . disability    Social History Main Topics  . Smoking status: Former Smoker    Quit date: 06/02/2010  . Smokeless tobacco: Not on file  . Alcohol Use: No  . Drug Use: No  . Sexual Activity: Yes   Other Topics Concern  . Not on file   Social History Narrative    Medications:   Current Outpatient Prescriptions on File Prior to Visit  Medication Sig Dispense Refill  . albuterol (PROVENTIL) (2.5 MG/3ML) 0.083% nebulizer solution Take 3 mLs (2.5 mg total) by nebulization every 6 (six) hours as needed for wheezing or shortness of breath. 75 mL 5  . albuterol (VENTOLIN HFA) 108 (90 BASE) MCG/ACT inhaler Inhale 2 puffs into the lungs every 4 (four) hours as needed for wheezing (rescue inhaler). 8.5 Inhaler 5  . amLODipine (NORVASC) 5 MG tablet TAKE 1 TABLET BY MOUTH EVERY DAY 30 tablet 2  . budesonide (PULMICORT) 0.25 MG/2ML nebulizer solution Take 2 mLs (0.25 mg total) by nebulization every 4 (four) hours. 60 mL 5  . desvenlafaxine (PRISTIQ) 50 MG 24 hr tablet Take 1 tablet (50 mg total) by mouth daily. 30 tablet 2  . diazepam (VALIUM) 2 MG tablet Take 1 tablet (2 mg total) by mouth as needed for anxiety. 30 tablet 0  . HYDROcodone-acetaminophen (NORCO/VICODIN) 5-325 MG per tablet Take 1 tablet by mouth 2 (two) times daily.    . montelukast (SINGULAIR) 10 MG tablet TAKE 1 TABLET BY MOUTH AT BEDTIME 90 tablet 0  . ondansetron (ZOFRAN-ODT) 4 MG disintegrating tablet Take 1 tablet (4 mg total) by mouth every 8 (eight) hours as needed for nausea or vomiting. 20 tablet 0  . oxybutynin (DITROPAN-XL) 10 MG 24 hr tablet Take 10 mg by mouth every morning.   3  .  OXYGEN Inhale 1.5 L into the lungs. For naps and sleep.    . pantoprazole (PROTONIX) 40 MG tablet Take 1 tablet by mouth every morning.     . polyethylene glycol (MIRALAX / GLYCOLAX) packet Take 17 g by mouth daily as needed for mild constipation.     . pregabalin (LYRICA) 50 MG capsule Take 50 mg by mouth 2 (two) times daily. ta    . tiZANidine (ZANAFLEX) 4 MG tablet Take 1 mg by mouth daily as needed for muscle spasms.     . traZODone (DESYREL) 100 MG tablet Take 1 tablet (100 mg total) by mouth at bedtime. 30 tablet 1  . warfarin (COUMADIN) 5 MG tablet Take 1 tablet by mouth daily or as directed by coumadin clinic 30 tablet 3  . TOPROL XL 25 MG 24 hr tablet TAKE  1 TABLET BY MOUTH EVERY DAY 30 tablet 0   No current facility-administered medications on file prior to visit.    Allergies:   Allergies  Allergen Reactions  . Alprazolam     REACTION: stops breathing  . Pseudoephedrine Hcl Er Shortness Of Breath  . Budesonide-Formoterol Fumarate     bliusters  . Aciphex [Rabeprazole Sodium]     rash  . Avelox [Moxifloxacin Hcl In Nacl]     Stomach cramps.   . Esomeprazole Magnesium     REACTION: "bouncing off walls"  . Flonase [Fluticasone Propionate]   . Iodine     REACTION: swelling in throat  . Loratadine     claritin D causes shaking  . Lotrimin [Clotrimazole] Other (See Comments)    blisters  . Lunesta [Eszopiclone]     REACTION: "slept for a week"  . Other     Glue from ekg/heart monitor leads --rash +  Any MYCINS  . Oxcarbazepine Other (See Comments)    Causes deep sleep and dizziness  . Zolpidem Tartrate     REACTION: "slept for a week"  . Bextra [Valdecoxib] Rash  . Ceclor [Cefaclor] Rash  . Covera-Hs [Verapamil Hcl] Palpitations  . Dicyclomine Hcl Rash  . Tessalon Perles Rash    Physical Exam General: well developed, well nourished elderly Caucasian lady, seated, in no evident distress Head: head normocephalic and atraumatic.   Neck: supple with no carotid or  supraclavicular bruits Cardiovascular: regular rate and rhythm, no murmurs Musculoskeletal: no deformity Skin:  no rash/petichiae Vascular:  Normal pulses all extremities  Neurologic Exam Mental Status: Awake and fully alert. Oriented to place and time. Recent and remote memory intact. Attention span, concentration and fund of knowledge appropriate. Mood and affect appropriate. Mini-Mental status exam not done Recall 2/3. Marland Kitchen Able to name 8 four legged  animals. Clock drawing 4/4. Cranial Nerves: Fundoscopic exam not done  . Pupils equal, briskly reactive to light. Extraocular movements full without nystagmus. Visual fields show dense right homonymous hemianopsia to confrontation. Hearing intact. Mild right lower facial asymmetry and weakness Facial sensation intact. Face, tongue, palate moves normally and symmetrically.  Motor: Normal bulk and tone. Normal strength in all tested extremity muscles. Mild weakness of right grip and intrinsic hand muscles. Diminished fine finger movements on the right. Orbits left over right approximately. Sensory.: intact to touch , pinprick , position and vibratory sensation.  Coordination: Rapid alternating movements normal in all extremities. Finger-to-nose and heel-to-shin performed accurately bilaterally. Gait and Station: Arises from chair without difficulty. Stance is normal. Gait demonstrates normal stride length and balance . Able to heel, toe and tandem walk without difficulty.  Reflexes: 1+ and symmetric. Toes downgoing.       ASSESSMENT: 56 year Caucasian lady with recent complaints of drooling from bilateral corner of the mouth of unclear etiology. She does have residual right lower facial weakness from a previous stroke 10 years ago and had recent dental surgery done which may perhaps contribute She does not have any associated features to suggest Parkinson's disease. She also has complaints of memory loss and mild cognitive impairment which may be is  age appropriate but needs further evaluation as well. Remote history of embolic left parieto-occipital infarct in 2006 with unknown  source but h/o small patent foramen ovale. H/o  atrial fibrillation    PLAN:  I had a long discussion with the patient with regards to Laurie Horton drooling particularly when she is tired and some intermittent double vision may merit further  evaluation from myasthenia. Recommend check acetylycholine receptor antibody panel and nerve conduction study with rapid repetitive stimulation. She was encouraged to continue part as patient and in cognitively challenging activities to help with Laurie Horton mild cognitive impairment which appears stable. She will return for follow-up in 3 months or call earlier if necessary.  Antony Contras, MD  Note: This document was prepared with digital dictation and possible smart phrase technology. Any transcriptional errors that result from this process are unintentional.

## 2015-12-02 NOTE — Telephone Encounter (Signed)
REFILL 

## 2015-12-04 ENCOUNTER — Ambulatory Visit (INDEPENDENT_AMBULATORY_CARE_PROVIDER_SITE_OTHER): Payer: Medicare Other | Admitting: Psychiatry

## 2015-12-04 ENCOUNTER — Ambulatory Visit (INDEPENDENT_AMBULATORY_CARE_PROVIDER_SITE_OTHER): Payer: Medicare Other | Admitting: Pharmacist Clinician (PhC)/ Clinical Pharmacy Specialist

## 2015-12-04 ENCOUNTER — Encounter (HOSPITAL_COMMUNITY): Payer: Self-pay | Admitting: Psychiatry

## 2015-12-04 VITALS — BP 126/98 | HR 73 | Ht 64.0 in | Wt 193.4 lb

## 2015-12-04 DIAGNOSIS — F0630 Mood disorder due to known physiological condition, unspecified: Secondary | ICD-10-CM

## 2015-12-04 DIAGNOSIS — F331 Major depressive disorder, recurrent, moderate: Secondary | ICD-10-CM | POA: Diagnosis not present

## 2015-12-04 DIAGNOSIS — I480 Paroxysmal atrial fibrillation: Secondary | ICD-10-CM

## 2015-12-04 DIAGNOSIS — F4310 Post-traumatic stress disorder, unspecified: Secondary | ICD-10-CM

## 2015-12-04 DIAGNOSIS — F411 Generalized anxiety disorder: Secondary | ICD-10-CM

## 2015-12-04 DIAGNOSIS — Z7901 Long term (current) use of anticoagulants: Secondary | ICD-10-CM | POA: Diagnosis not present

## 2015-12-04 LAB — POCT INR: INR: 3.7

## 2015-12-04 MED ORDER — DESVENLAFAXINE SUCCINATE ER 50 MG PO TB24: 50.0000 mg | ORAL_TABLET | Freq: Every day | ORAL | Status: DC

## 2015-12-04 MED ORDER — DIAZEPAM 2 MG PO TABS: 2.0000 mg | ORAL_TABLET | ORAL | Status: DC | PRN

## 2015-12-04 MED ORDER — TRAZODONE HCL 100 MG PO TABS: 100.0000 mg | ORAL_TABLET | Freq: Every day | ORAL | Status: DC

## 2015-12-04 NOTE — Progress Notes (Signed)
[facility] [phone] Progress Note  Laurie Horton [id number] 70 y.o.  12/04/2015 9:15 AM  Chief Complaint:  I am very upset.  I have to rescheduled my surgery.  I cannot get pain medication.  My INR is a high.  History of Present Illness:  Patient came for her follow-up appointment.  She is complaining of increase in anxiety and nervousness.  For past 2 weeks she has tooth ache and she was told that she has abscess and required surgery but recently her blood test shows that she has high INR and she has to reschedule her surgery.  She is also unable to get injection for her pain and she is not happy about it.  She is complaining of insomnia due to pain.  She admitted irritability and some time crying spells but denies any active or passive suicidal thoughts or homicidal thought.  Recently she's seen neurologist for drooling and memory impairment.  She is complaining of memory impairment, fatigue, tremors, excessive sleep, lack of energy and she was recommended to have blood test to rule out Parkinson.  On her last visit we had cut down trazodone because she was complaining of dizziness which is somewhat better since dose decreased.  She is taking Prestiq, trazodone and Valium as needed.  She has not taken Valium in a while but she feels today she might needed because she is very nervous.  Her blood pressure was also slightly increase.  She denies any nightmares or any flashback.  She is not interested in counseling.  She denies any paranoia or any hallucination.  She is also disappointed because her primary care physician Dr. Everlene Farrier is retiring.  Patient lives by herself however her daughter lives close by.  She denies drinking or using any illegal substances.  She admitted sometime feeling hopeless and helpless but denies any hallucination, paranoia, impulsive behavior or any suicidal thoughts.  Her appetite is okay.  Suicidal Ideation: No Plan Formed: No Patient has means to carry out plan:  No  Homicidal Ideation: No Plan Formed: No Patient has means to carry out plan: No  Medical History; Patient has hypertension, fibromyalgia, scoliosis, headache, history of stroke and memory impairment.  She has loss of peripheral vision and weakness on the right side which is minimal.  She has chronic back pain.  Her primary care physician is Dr. Everlene Farrier who is now retiring.  Family History; Patient denies any family history of psychiatric illness.  However her son has history of drug and alcohol problem.  Substance Abuse History; Patient denies any history of drug use.  Past Psychiatric History/Hospitalization(s) Patient denies any previous history of psychiatric inpatient treatment, suicidal attempt, paranoia, hallucination or any mania. She started seeing psychiatrist in 2006 after she had a stroke. She had tried Cymbalta and Celexa in the past. She also tried Ambien, Lunesta and Xanax but developed allergies and side effects Anxiety: Yes Bipolar Disorder: No Depression: Yes Mania: No Psychosis: No Schizophrenia: No Personality Disorder: No Hospitalization for psychiatric illness: No History of Electroconvulsive Shock Therapy: No Prior Suicide Attempts: No   Review of Systems  Constitutional: Negative for weight loss.  HENT: Negative.   Musculoskeletal: Positive for back pain.  Skin: Positive for rash. Negative for itching.  Neurological: Positive for tremors. Negative for dizziness.  Psychiatric/Behavioral: Positive for depression and memory loss. The patient is nervous/anxious and has insomnia.     Psychiatric: Agitation: No Hallucination: No Depressed Mood: Yes Insomnia: Yes Hypersomnia: Yes Altered Concentration: No Feels Worthless:  Yes Grandiose Ideas: No Belief In Special Powers: No New/Increased Substance Abuse: No Compulsions: No  Neurologic: Headache: No Seizure: No Paresthesias: No  Outpatient Encounter Prescriptions as of 12/04/2015  Medication Sig   . albuterol (PROVENTIL) (2.5 MG/3ML) 0.083% nebulizer solution Take 3 mLs (2.5 mg total) by nebulization every 6 (six) hours as needed for wheezing or shortness of breath.  Marland Kitchen albuterol (VENTOLIN HFA) 108 (90 BASE) MCG/ACT inhaler Inhale 2 puffs into the lungs every 4 (four) hours as needed for wheezing (rescue inhaler).  Marland Kitchen amLODipine (NORVASC) 5 MG tablet TAKE 1 TABLET BY MOUTH EVERY DAY  . budesonide (PULMICORT) 0.25 MG/2ML nebulizer solution Take 2 mLs (0.25 mg total) by nebulization every 4 (four) hours.  Marland Kitchen desvenlafaxine (PRISTIQ) 50 MG 24 hr tablet Take 1 tablet (50 mg total) by mouth daily.  . diazepam (VALIUM) 2 MG tablet Take 1 tablet (2 mg total) by mouth as needed for anxiety.  Marland Kitchen HYDROcodone-acetaminophen (NORCO/VICODIN) 5-325 MG per tablet Take 1 tablet by mouth 2 (two) times daily.  . montelukast (SINGULAIR) 10 MG tablet TAKE 1 TABLET BY MOUTH AT BEDTIME  . ondansetron (ZOFRAN-ODT) 4 MG disintegrating tablet Take 1 tablet (4 mg total) by mouth every 8 (eight) hours as needed for nausea or vomiting.  Marland Kitchen oxybutynin (DITROPAN-XL) 10 MG 24 hr tablet Take 10 mg by mouth every morning.   . OXYGEN Inhale 1.5 L into the lungs. For naps and sleep.  . pantoprazole (PROTONIX) 40 MG tablet Take 1 tablet by mouth every morning.   . polyethylene glycol (MIRALAX / GLYCOLAX) packet Take 17 g by mouth daily as needed for mild constipation.   . pregabalin (LYRICA) 50 MG capsule Take 50 mg by mouth 2 (two) times daily. ta  . tiZANidine (ZANAFLEX) 4 MG tablet Take 1 mg by mouth daily as needed for muscle spasms.   . TOPROL XL 25 MG 24 hr tablet TAKE 1 TABLET BY MOUTH EVERY DAY  . traZODone (DESYREL) 100 MG tablet Take 1 tablet (100 mg total) by mouth at bedtime.  Marland Kitchen warfarin (COUMADIN) 5 MG tablet Take 1 tablet by mouth daily or as directed by coumadin clinic  . [DISCONTINUED] desvenlafaxine (PRISTIQ) 50 MG 24 hr tablet Take 1 tablet (50 mg total) by mouth daily.  . [DISCONTINUED] diazepam (VALIUM) 2 MG  tablet Take 1 tablet (2 mg total) by mouth as needed for anxiety.  . [DISCONTINUED] traZODone (DESYREL) 100 MG tablet Take 1 tablet (100 mg total) by mouth at bedtime.   No facility-administered encounter medications on file as of 12/04/2015.    Recent Results (from the past 2160 hour(s))  POCT INR     Status: None   Collection Time: 10/07/15  8:12 AM  Result Value Ref Range   INR 2.2   POCT INR     Status: None   Collection Time: 11/18/15  8:09 AM  Result Value Ref Range   INR 3.8   Acetylcholine Receptor Ab, All     Status: None (Preliminary result)   Collection Time: 12/02/15 10:25 AM  Result Value Ref Range   Acetylcholine Modulat Ab Comment 0 - 20 %    Comment: Specimen has been received and testing has been initiated.                               Negative:          <21  Equivocal:     21 - 25                               Positive:          >25  The assay is linear between values of 12 and 64.  Those <12 and >64 are reported as such.  No single  value for ACR-modulating antibody should be used as  a sole basis for diagnosis or response to therapy.   POCT INR     Status: None   Collection Time: 12/04/15  8:01 AM  Result Value Ref Range   INR 3.7     Physical Exam: Consitutional ;  BP 126/98 mmHg  Pulse 73  Ht 5\' 4"  (1.626 m)  Wt 193 lb 6.4 oz (87.726 kg)  BMI 33.18 kg/m2  Musculoskeletal: Strength & Muscle Tone: within normal limits Gait & Station: normal Patient leans: N/A   Psychiatric Specialty Exam: General Appearance: Casual  Eye Contact::  Fair  Speech:  Clear and Coherent  Volume:  Decreased  Mood:  Anxious, Depressed and Dysphoric  Affect:  Congruent  Thought Process:  Coherent  Orientation:  Full (Time, Place, and Person)  Thought Content:  Rumination  Suicidal Thoughts:  No  Homicidal Thoughts:  No  Memory:  Immediate;   Fair Recent;   Fair Remote;   Fair  Judgement:  Intact  Insight:  Fair  Psychomotor  Activity:  Tremor  Concentration:  Fair  Recall:  [employer] of Knowledge:  Fair  Language:  Good  Akathisia:  No  Handed:  Right  AIMS (if indicated):     Assets:  Communication Skills Desire for Improvement Housing Social Support  ADL's:  Intact  Cognition:  Impaired,  Mild  Sleep:        Review of Psycho-Social Stressors (1), Review or order clinical lab tests (1), Review and summation of old records (2), Review of Last Therapy Session (1) and Review of Medication Regimen & Side Effects (2)  Assessment: Axis I; major depressive disorder, recurrent.  Posttraumatic stress disorder.  Mood disorder due to general medical condition.  Anxiety disorder NOS , Cognitive disorder NOS  Axis III:  Past Medical History  Diagnosis Date  . Depression   . GERD (gastroesophageal reflux disease)   . HTN (hypertension)   . Hiatal hernia   . COPD (chronic obstructive pulmonary disease) ([place])   . Diverticulitis   . DVT (deep venous thrombosis) ([place])   . Obesity   . Hyperlipidemia   . PFO (patent foramen ovale)   . PAT (paroxysmal atrial tachycardia) ([place])   . Right middle lobe pneumonia 07/24/2011    First noted at admit 07/10/11. Persists on cxr 07/22/11. Cleared on CT 08/24/11. No further followup  . PULMONARY NODULE, LEFT LOWER LOBE 10/14/2009    41mm LLL nodule dec 2010. Stable and 46mm in Oct 2012. No further fu  . TOBACCO ABUSE 06/04/2009  . Osteoporosis   . Fibromyalgia   . Stroke ([place])   . History of deviated nasal septum     left- side  . On supplemental oxygen therapy     concentrator at night @ 1.5 l/m or when sleeps. O2 Sat niormally 87.  . Complication of anesthesia     various issues with oxygen  saturations post op    Plan:  I review her symptoms, history, collateral information from neurology and recent blood work results.  Patient is somewhat more depressed sad due to situation that she cannot have surgery for her abscess and cannot get pain medication.  She is hoping  next INR within therapeutic range so she can proceed treatment for her abscess and back pain.  We have cut down trazodone on her last visit that helps her dizziness but she still have cognitive impairment.  I offer counseling but patient declined due to financial reason.  She does not want to change her psychiatric medication.  She like to continue trazodone 100 mg at bedtime, Valium 2 mg as needed for severe anxiety and Pristiq 50 mg daily.  She has mild tremors in her hand and her neurologist is working to rule out Parkinson.  I will defer any increase in her psychiatric medication to avoid worsening of tremors.  Discuss serotonin syndrome and EPS.  Discuss safety plan that anytime having active suicidal thoughts or homicidal thoughts and she need to call 911 or go to the local emergency room.  I will see her again in 3 months.  Arshan Jabs T., MD 12/04/2015

## 2015-12-05 LAB — ACETYLCHOLINE RECEPTOR AB, ALL
Acetylchol Block Ab: 18 % (ref 0–25)
Acetylcholine Modulat Ab: <12 % (ref 0–20)

## 2015-12-09 ENCOUNTER — Telehealth: Payer: Self-pay

## 2015-12-09 NOTE — Telephone Encounter (Signed)
Rn notified her that the lab work for myasthenia was negative. Pt verbalized understanding.

## 2015-12-09 NOTE — Telephone Encounter (Signed)
-----   Message from Garvin Fila, MD sent at 12/07/2015  3:49 PM EST ----- Kindly inform patient that lab work for myasthenia was negative

## 2015-12-19 ENCOUNTER — Ambulatory Visit (INDEPENDENT_AMBULATORY_CARE_PROVIDER_SITE_OTHER): Payer: Medicare Other | Admitting: Family Medicine

## 2015-12-19 ENCOUNTER — Ambulatory Visit (INDEPENDENT_AMBULATORY_CARE_PROVIDER_SITE_OTHER): Payer: Self-pay | Admitting: Neurology

## 2015-12-19 VITALS — BP 136/91 | HR 84 | Temp 98.1°F | Resp 16 | Ht 64.0 in | Wt 195.0 lb

## 2015-12-19 DIAGNOSIS — M797 Fibromyalgia: Secondary | ICD-10-CM | POA: Diagnosis not present

## 2015-12-19 DIAGNOSIS — M6281 Muscle weakness (generalized): Secondary | ICD-10-CM

## 2015-12-19 DIAGNOSIS — M25571 Pain in right ankle and joints of right foot: Secondary | ICD-10-CM

## 2015-12-19 DIAGNOSIS — G894 Chronic pain syndrome: Secondary | ICD-10-CM

## 2015-12-19 NOTE — Progress Notes (Addendum)
Chief Complaint:  Chief Complaint  Patient presents with  . Hand Pain    x 4 days  . Knee Pain  . Shoulder Pain    HPI: Laurie Horton is a 70 y.o. female who reports to [facility] today complaining of 4 day history of bilateral knuckle pain,right  knee pain, also bilateral shoulder ain ( she has a right shoulder tenodnitis) . She cannot take many drugs due to allergies and current health status, ie cannot take steroids due to elevated INR, she was supposed to get a Myasthenia gravis test today but cancelled because electrodes would cause her too much pain. 10/10 pain.  She has been tearful for the last 4 days, she has an appt tomorrow withDr Dallas Schimke She was told by her neighbor who is a nurse that she needs to get tested for RA Denies any trauma  Past Medical History  Diagnosis Date  . Depression   . GERD (gastroesophageal reflux disease)   . HTN (hypertension)   . Hiatal hernia   . COPD (chronic obstructive pulmonary disease) ([place])   . Diverticulitis   . DVT (deep venous thrombosis) ([place])   . Obesity   . Hyperlipidemia   . PFO (patent foramen ovale)   . PAT (paroxysmal atrial tachycardia) ([place])   . Right middle lobe pneumonia 07/24/2011    First noted at admit 07/10/11. Persists on cxr 07/22/11. Cleared on CT 08/24/11. No further followup  . PULMONARY NODULE, LEFT LOWER LOBE 10/14/2009    41mm LLL nodule dec 2010. Stable and 66mm in Oct 2012. No further fu  . TOBACCO ABUSE 06/04/2009  . Osteoporosis   . Fibromyalgia   . Stroke ([place])   . History of deviated nasal septum     left- side  . On supplemental oxygen therapy     concentrator at night @ 1.5 l/m or when sleeps. O2 Sat niormally 87.  . Complication of anesthesia     various issues with oxygen  saturations post op   Past Surgical History  Procedure Laterality Date  . Total abdominal hysterectomy      post op needed oxygen was told "she gave them a scare"  . Knee arthroscopy  2000    left  . Laparoscopic  cholecystectomy  04-16-2010    [facility]  . Tubal ligation    . US echocardiography  11/20/2009    EF 55-60%  . Cardiovascular stress test  12/26/2004    EF 74%. NO EVIDENCE OF ISCHEMIA  . Mouth surgery      03-26-15 multiple extractions stitches remains  . Esophagogastroduodenoscopy (egd) with propofol N/A 04/15/2015    Procedure: ESOPHAGOGASTRODUODENOSCOPY (EGD) WITH PROPOFOL;  Surgeon: Laurence Spates, MD;  Location: [facility];  Service: Endoscopy;  Laterality: N/A;   Social History   Social History  . Marital Status: Divorced    Spouse Name: N/A  . Number of Children: 2  . Years of Education: N/A   Occupational History  . disability    Social History Main Topics  . Smoking status: Former Smoker    Quit date: 06/02/2010  . Smokeless tobacco: Never Used  . Alcohol Use: No  . Drug Use: No  . Sexual Activity: Yes   Other Topics Concern  . None   Social History Narrative   Family History  Problem Relation Age of Onset  . Dementia Mother   . Diabetes Mother   . Alzheimer's disease Mother   . Heart attack Brother 70  .  Schizophrenia Sister   . Diabetes Sister   . Tremor Sister   . Heart attack Father    Allergies  Allergen Reactions  . Alprazolam     REACTION: stops breathing  . Pseudoephedrine Hcl Er Shortness Of Breath  . Budesonide-Formoterol Fumarate     bliusters  . Aciphex [Rabeprazole Sodium]     rash  . Avelox [Moxifloxacin Hcl In Nacl]     Stomach cramps.   . Esomeprazole Magnesium     REACTION: "bouncing off walls"  . Flonase [Fluticasone Propionate]   . Iodine     REACTION: swelling in throat  . Loratadine     claritin D causes shaking  . Lotrimin [Clotrimazole] Other (See Comments)    blisters  . Lunesta [Eszopiclone]     REACTION: "slept for a week"  . Other     Glue from ekg/heart monitor leads --rash +  Any MYCINS  . Oxcarbazepine Other (See Comments)    Causes deep sleep and dizziness  . Zolpidem Tartrate     REACTION: "slept for a  week"  . Bextra [Valdecoxib] Rash  . Ceclor [Cefaclor] Rash  . Covera-Hs [Verapamil Hcl] Palpitations  . Dicyclomine Hcl Rash  . Tessalon Perles Rash   Prior to Admission medications   Medication Sig Start Date End Date Taking? Authorizing Provider  albuterol (PROVENTIL) (2.5 MG/3ML) 0.083% nebulizer solution Take 3 mLs (2.5 mg total) by nebulization every 6 (six) hours as needed for wheezing or shortness of breath. 06/21/15  Yes Tammy S Parrett, NP  albuterol (VENTOLIN HFA) 108 (90 BASE) MCG/ACT inhaler Inhale 2 puffs into the lungs every 4 (four) hours as needed for wheezing (rescue inhaler). 04/02/15  Yes [name], MD  amLODipine (NORVASC) 5 MG tablet TAKE 1 TABLET BY MOUTH EVERY DAY 08/25/15  Yes [name], MD  budesonide (PULMICORT) 0.25 MG/2ML nebulizer solution Take 2 mLs (0.25 mg total) by nebulization every 4 (four) hours. 06/21/15  Yes Tammy S Parrett, NP  desvenlafaxine (PRISTIQ) 50 MG 24 hr tablet Take 1 tablet (50 mg total) by mouth daily. 12/04/15  Yes Kathlee Nations, MD  diazepam (VALIUM) 2 MG tablet Take 1 tablet (2 mg total) by mouth as needed for anxiety. 12/04/15  Yes Kathlee Nations, MD  HYDROcodone-acetaminophen (NORCO/VICODIN) 5-325 MG per tablet Take 1 tablet by mouth 2 (two) times daily.   Yes Historical Provider, MD  montelukast (SINGULAIR) 10 MG tablet TAKE 1 TABLET BY MOUTH AT BEDTIME 10/08/15  Yes [name], MD  ondansetron (ZOFRAN-ODT) 4 MG disintegrating tablet Take 1 tablet (4 mg total) by mouth every 8 (eight) hours as needed for nausea or vomiting. 11/06/14  Yes [name], MD  oxybutynin (DITROPAN-XL) 10 MG 24 hr tablet Take 10 mg by mouth every morning.  11/28/14  Yes Historical Provider, MD  OXYGEN Inhale 1.5 L into the lungs. For naps and sleep.   Yes Historical Provider, MD  pantoprazole (PROTONIX) 40 MG tablet Take 1 tablet by mouth every morning.  07/24/13  Yes Historical Provider, MD  polyethylene glycol (MIRALAX / GLYCOLAX) packet Take 17 g by mouth  daily as needed for mild constipation.    Yes Historical Provider, MD  pregabalin (LYRICA) 50 MG capsule Take 50 mg by mouth 2 (two) times daily. ta 08/03/14  Yes Historical Provider, MD  tiZANidine (ZANAFLEX) 4 MG tablet Take 1 mg by mouth daily as needed for muscle spasms.    Yes Historical Provider, MD  TOPROL XL 25  MG 24 hr tablet TAKE 1 TABLET BY MOUTH EVERY DAY 12/02/15  Yes Peter M Martinique, MD  traZODone (DESYREL) 100 MG tablet Take 1 tablet (100 mg total) by mouth at bedtime. 12/04/15  Yes Kathlee Nations, MD  warfarin (COUMADIN) 5 MG tablet Take 1 tablet by mouth daily or as directed by coumadin clinic 08/26/15  Yes Peter M Martinique, MD     ROS: The patient denies fevers, chills, night sweats, unintentional weight loss, chest pain, palpitations, wheezing, dyspnea on exertion, nausea, vomiting, abdominal pain, dysuria, hematuria, melena, numbness, weakness, or tingling. ll other systems have been reviewed and were otherwise negative with the exception of those mentioned in the HPI and as above.    PHYSICAL EXAM: Filed Vitals:   12/19/15 1641  BP: 136/91  Pulse: 84  Temp: 98.1 F (36.7 C)  Resp: 16   SpO2 Readings from Last 3 Encounters:  12/19/15 92%  07/11/15 88%  06/21/15 91%    Body mass index is 33.46 kg/(m^2).   General: Alert, tearful, mild acute distress HEENT:  Normocephalic, atraumatic, oropharynx patent. EOMI, PERRLA Respiratory: No cyanosis, no use of accessory musculature Skin: No rashes. Neurologic: Facial musculature symmetric. Psychiatric: Patient is over emotional throughout our interaction. Musculoskeletal: Gait intact. No edema, redness or warmth on hand, knuckles. She is movingher shoulder bialterally without pain    LABS: Results for orders placed or performed in visit on 12/04/15  POCT INR  Result Value Ref Range   INR 3.7      EKG/XRAY:   Primary read interpreted by Dr. Marin Comment at [facility].   ASSESSMENT/PLAN: Encounter Diagnoses  Name Primary?  . Pain  in joint, ankle and foot, right Yes  . Chronic pain syndrome   . Fibromyalgia    70 y/o female with a pMH of COPD on home 02, Depression/anxiety, DVT , PAF on coumadin, fibromyalagia, CAD, Stroke who presents with worsening diffuse jint pain x 4 days  Patient has acute on chronic bilateral knuckle pain, right knee pain and also left shoudler pain She has been without norco since this AM She has a pain contract with Dr Lillia Carmel, she will see her tomorrow.  I offered her different options but she is very distressed and tearful and does not appear to be willing to consider any options, we talked about giving her 5 pills of norco and see how she does, obviously letting Dr Lillia Carmel know since she has a pain contract with her so that she can weather through the night, She is very dramatic and states that she can "suffer through another night since she has been dealing with this for 4 days" already. I offered her tylenol, she states she cannot take more tylenol because she is on norco ( again last dose of 5/325 mg was this morning ) ; she cannot take presdniosone due to INR being too elevated, she cannot take Tinazidine due to beeing too sleepy, she thinks the volatren gel is wiorthless, she cannot take oxycodone because it makes her overly sedated and she ahs animals to take care of She is not ammendadable to different treatment options, declined blood work here for RA or other inflamm markers.  Unfortunately she is not happy and states she wasted her time and my time by coming here She will see Dr Jacob Moores tomorrow.  [place] Controlled DB pulled ,a pparently pateitn had several percocets from dentist recently but no abnormal acitvities.    Gross sideeffects, risk and benefits, and alternatives of medications d/w patient.  Patient is aware that all medications have potential sideeffects and we are unable to predict every sideeffect or drug-drug interaction that may occur.  Shallon Yaklin DO  12/19/2015 5:47  PM

## 2015-12-20 ENCOUNTER — Ambulatory Visit (INDEPENDENT_AMBULATORY_CARE_PROVIDER_SITE_OTHER): Payer: Medicare Other | Admitting: Cardiology

## 2015-12-20 ENCOUNTER — Telehealth: Payer: Self-pay | Admitting: Neurology

## 2015-12-20 ENCOUNTER — Ambulatory Visit (INDEPENDENT_AMBULATORY_CARE_PROVIDER_SITE_OTHER): Payer: Medicare Other | Admitting: Pharmacist Clinician (PhC)/ Clinical Pharmacy Specialist

## 2015-12-20 ENCOUNTER — Encounter: Payer: Self-pay | Admitting: Cardiology

## 2015-12-20 VITALS — BP 144/100 | HR 113 | Ht 64.0 in | Wt 195.1 lb

## 2015-12-20 DIAGNOSIS — I480 Paroxysmal atrial fibrillation: Secondary | ICD-10-CM | POA: Diagnosis not present

## 2015-12-20 DIAGNOSIS — I10 Essential (primary) hypertension: Secondary | ICD-10-CM

## 2015-12-20 DIAGNOSIS — Z79899 Other long term (current) drug therapy: Secondary | ICD-10-CM | POA: Diagnosis not present

## 2015-12-20 DIAGNOSIS — Z7901 Long term (current) use of anticoagulants: Secondary | ICD-10-CM

## 2015-12-20 LAB — POCT INR: INR: 1.6

## 2015-12-20 MED ORDER — METOPROLOL SUCCINATE ER 50 MG PO TB24: 50.0000 mg | ORAL_TABLET | Freq: Every day | ORAL | Status: DC

## 2015-12-20 NOTE — Patient Instructions (Addendum)
Increase Toprol XL to 50 mg daily  Continue coumadin as Erasmo Downer directed.   I will see you in 6 months.

## 2015-12-20 NOTE — Progress Notes (Signed)
Laurie Horton Date of Birth: Dec 10, 1945 Medical Record [id number]  History of Present Illness: Laurie Horton is seen for follow up atrial fibrillation. She has a history of  abnormal ECG, severe COPD, stroke after knee surgery in 2006. Evaluation at that time revealed a PFO by TEE. She was previously maintained on aspirin therapy. In December 2015 she was noted to have more paroxysmal Afib. She was anticoagulated and Toprol dose was increased to 25 mg. She wore an event monitor that showed infrequent runs of Afib.  In December she developed more facial droop and was concerned that she had another stroke. She was seen by Dr. Leonie Man and MRI done showing old CVA but no acute findings. He felt her symptoms were related to old CVA and some recent dental procedures. Carotid dopplers showed 50-69% stenosis bilaterally. On follow up today she reports severe pain in hands, shoulders and knees for the past week. Thinks her heart has been out of rhythm for 24 hours. Feels a dull ache in her chest. Is having dental work for an Abscess and held coumadin.   Current Outpatient Prescriptions  Medication Sig Dispense Refill  . albuterol (PROVENTIL) (2.5 MG/3ML) 0.083% nebulizer solution Take 3 mLs (2.5 mg total) by nebulization every 6 (six) hours as needed for wheezing or shortness of breath. 75 mL 5  . albuterol (VENTOLIN HFA) 108 (90 BASE) MCG/ACT inhaler Inhale 2 puffs into the lungs every 4 (four) hours as needed for wheezing (rescue inhaler). 8.5 Inhaler 5  . amLODipine (NORVASC) 5 MG tablet TAKE 1 TABLET BY MOUTH EVERY DAY 30 tablet 2  . budesonide (PULMICORT) 0.25 MG/2ML nebulizer solution Take 2 mLs (0.25 mg total) by nebulization every 4 (four) hours. 60 mL 5  . desvenlafaxine (PRISTIQ) 50 MG 24 hr tablet Take 1 tablet (50 mg total) by mouth daily. 30 tablet 2  . diazepam (VALIUM) 2 MG tablet Take 1 tablet (2 mg total) by mouth as needed for anxiety. 30 tablet 0  . HYDROcodone-acetaminophen  (NORCO/VICODIN) 5-325 MG per tablet Take 1 tablet by mouth 2 (two) times daily.    . montelukast (SINGULAIR) 10 MG tablet TAKE 1 TABLET BY MOUTH AT BEDTIME 90 tablet 0  . ondansetron (ZOFRAN-ODT) 4 MG disintegrating tablet Take 1 tablet (4 mg total) by mouth every 8 (eight) hours as needed for nausea or vomiting. 20 tablet 0  . oxybutynin (DITROPAN-XL) 10 MG 24 hr tablet Take 10 mg by mouth every morning.   3  . OXYGEN Inhale 1.5 L into the lungs. For naps and sleep.    . pantoprazole (PROTONIX) 40 MG tablet Take 1 tablet by mouth every morning.     . polyethylene glycol (MIRALAX / GLYCOLAX) packet Take 17 g by mouth daily as needed for mild constipation.     . pregabalin (LYRICA) 50 MG capsule Take 50 mg by mouth 2 (two) times daily. ta    . tiZANidine (ZANAFLEX) 4 MG tablet Take 1 mg by mouth daily as needed for muscle spasms.     . TOPROL XL 25 MG 24 hr tablet TAKE 1 TABLET BY MOUTH EVERY DAY 30 tablet 0  . traZODone (DESYREL) 100 MG tablet Take 1 tablet (100 mg total) by mouth at bedtime. 30 tablet 2  . warfarin (COUMADIN) 5 MG tablet Take 1 tablet by mouth daily or as directed by coumadin clinic 30 tablet 3   No current facility-administered medications for this visit.    Allergies  Allergen Reactions  . Alprazolam  REACTION: stops breathing  . Pseudoephedrine Hcl Er Shortness Of Breath  . Budesonide-Formoterol Fumarate     bliusters  . Aciphex [Rabeprazole Sodium]     rash  . Avelox [Moxifloxacin Hcl In Nacl]     Stomach cramps.   . Esomeprazole Magnesium     REACTION: "bouncing off walls"  . Flonase [Fluticasone Propionate]   . Iodine     REACTION: swelling in throat  . Loratadine     claritin D causes shaking  . Lotrimin [Clotrimazole] Other (See Comments)    blisters  . Lunesta [Eszopiclone]     REACTION: "slept for a week"  . Other     Glue from ekg/heart monitor leads --rash +  Any MYCINS  . Oxcarbazepine Other (See Comments)    Causes deep sleep and dizziness   . Zolpidem Tartrate     REACTION: "slept for a week"  . Bextra [Valdecoxib] Rash  . Ceclor [Cefaclor] Rash  . Covera-Hs [Verapamil Hcl] Palpitations  . Dicyclomine Hcl Rash  . Tessalon Perles Rash    Past Medical History  Diagnosis Date  . Depression   . GERD (gastroesophageal reflux disease)   . HTN (hypertension)   . Hiatal hernia   . COPD (chronic obstructive pulmonary disease) ([place])   . Diverticulitis   . DVT (deep venous thrombosis) ([place])   . Obesity   . Hyperlipidemia   . PFO (patent foramen ovale)   . PAT (paroxysmal atrial tachycardia) ([place])   . Right middle lobe pneumonia 07/24/2011    First noted at admit 07/10/11. Persists on cxr 07/22/11. Cleared on CT 08/24/11. No further followup  . PULMONARY NODULE, LEFT LOWER LOBE 10/14/2009    24mm LLL nodule dec 2010. Stable and 25mm in Oct 2012. No further fu  . TOBACCO ABUSE 06/04/2009  . Osteoporosis   . Fibromyalgia   . Stroke ([place])   . History of deviated nasal septum     left- side  . On supplemental oxygen therapy     concentrator at night @ 1.5 l/m or when sleeps. O2 Sat niormally 87.  . Complication of anesthesia     various issues with oxygen  saturations post op    Past Surgical History  Procedure Laterality Date  . Total abdominal hysterectomy      post op needed oxygen was told "she gave them a scare"  . Knee arthroscopy  2000    left  . Laparoscopic cholecystectomy  04-16-2010    [facility]  . Tubal ligation    . US echocardiography  11/20/2009    EF 55-60%  . Cardiovascular stress test  12/26/2004    EF 74%. NO EVIDENCE OF ISCHEMIA  . Mouth surgery      03-26-15 multiple extractions stitches remains  . Esophagogastroduodenoscopy (egd) with propofol N/A 04/15/2015    Procedure: ESOPHAGOGASTRODUODENOSCOPY (EGD) WITH PROPOFOL;  Surgeon: Laurence Spates, MD;  Location: [facility];  Service: Endoscopy;  Laterality: N/A;    History  Smoking status  . Former Smoker  . Quit date: 06/02/2010  Smokeless tobacco    . Never Used    History  Alcohol Use No    Family History  Problem Relation Age of Onset  . Dementia Mother   . Diabetes Mother   . Alzheimer's disease Mother   . Heart attack Brother 67  . Schizophrenia Sister   . Diabetes Sister   . Tremor Sister   . Heart attack Father     Review of Systems: The review of  systems is per the HPI.  All other systems were reviewed and are negative.  Physical Exam: BP 144/100 mmHg  Pulse 113  Ht 5\' 4"  (1.626 m)  Wt 88.497 kg (195 lb 1.6 oz)  BMI 33.47 kg/m2 Patient is obese, alert and in moderate distress.  Skin is warm and dry. Color is normal.  HEENT is unremarkable. Normocephalic/atraumatic. PERRL. Sclera are nonicteric. Neck is supple. No masses. No JVD. Lungs with few rhonchi. CV IRR, No gallop or murmur.  She does have chest wall tenderness to palpation. Abdomen is soft. Extremities are without edema.   Wt Readings from Last 3 Encounters:  12/20/15 88.497 kg (195 lb 1.6 oz)  12/19/15 88.451 kg (195 lb)  12/04/15 87.726 kg (193 lb 6.4 oz)    LABORATORY DATA/PROCEDURES:    Lab Results  Component Value Date   WBC 4.7 07/11/2015   HGB 13.6 07/11/2015   HCT 39.6 07/11/2015   PLT 196 07/11/2015   GLUCOSE 115* 07/11/2015   CHOL 237* 07/11/2015   TRIG 182* 07/11/2015   HDL 39* 07/11/2015   LDLCALC 162* 07/11/2015   ALT 14 07/11/2015   AST 14 07/11/2015   NA 137 07/11/2015   K 4.0 07/11/2015   CL 102 07/11/2015   CREATININE 0.93 07/11/2015   BUN 14 07/11/2015   CO2 26 07/11/2015   TSH 1.960 08/16/2015   INR 3.7 12/04/2015   HGBA1C 5.8* 05/04/2013    BNP (last 3 results) No results for input(s): PROBNP in the last 8760 hours.   Lexiscan Myoview Impression from July 2014 Exercise Capacity: [place] with no exercise. BP Response: Normal blood pressure response. Clinical Symptoms: Dizzy ECG Impression: No significant ST segment change suggestive of ischemia. Comparison with Prior Nuclear Study: No images to  compare  Overall Impression: Normal stress nuclear study.  LV Ejection Fraction: 80%. LV Wall Motion: NL LV Function; NL Wall Motion  [place]  Event monitor tracings were personally reviewed in detail. She had copious recordings. The predominant rhythm is NSR with frequent PACs. She does have several short bursts of PAT and atrial fibrillation. The longest episode lasting 18 seconds. The episodes the patient recorded do not consistently correlate with AFib.    Echo 10/17/14: Study Conclusions  - Left ventricle: The cavity size was normal. There was mild concentric hypertrophy. Systolic function was normal. The estimated ejection fraction was in the range of 60% to 65%. Wall motion was normal; there were no regional wall motion abnormalities. Doppler parameters are consistent with abnormal left ventricular relaxation (grade 1 diastolic dysfunction). - Aorta: Aortic root dimension: 44 mm (ED). - Ascending aorta: The ascending aorta was mildly dilated. - Left atrium: The atrium was mildly dilated.  NEUROIMAGING REPORT   STUDY DATE: 08/31/2015 PATIENT NAME: Laurie Horton DOB: 03/19/1946 MRN: [id number]  EXAM: MRI Brain without contrast  ORDERING CLINICIAN: Antony Contras M.D. CLINICAL HISTORY: 70 year old woman with memory loss COMPARISON FILMS: MRI of the brain 05/01/2008, CT scan 06/19/2012.  TECHNIQUE: MRI of the brain without contrast was obtained utilizing 5 mm axial slices with T1, T2, T2 flair, SWI and diffusion weighted views. T1 sagittal and T2 coronal views were obtained. CONTRAST: none IMAGING SITE: [facility], [place], [place]  FINDINGS: On sagittal images, the spinal cord is imaged caudally to C2-C3 and is normal in caliber. The contents of the posterior fossa are of normal size and position. The pituitary gland and optic chiasm appear normal. Brain volume appears normal for age. The ventricles are normal  in size and  without distortion. There are no abnormal extra-axial collections of fluid.   In the cerebellum, there are some scattered T2/FLAIR hyperintense foci consistent with chronic microvascular ischemic changes. Additionally, there is a small chronic wedge shaped stroke in the left cerebellar hemisphere. Most of these changes in the cerebellum have occurred since the MRI dated 05/01/2008 and do not appear to be present on CT scan dated 06/19/2012. The brainstem is essentially normal with just one subtle hyperintense focus in the pons. In the deep gray matter, there are a couple small T2/FLAIR hyperintense foci. There is encephalomalacia involving the left occipital lobe that appears unchanged when compared to the prior MRI. Elsewhere in the hemispheres, there are extensive single and confluent T2/FLAIR hyperintense foci, predominantly in the deep white matter, additionally in the periventricular and subcortical white matter.. These changes have only progressed mildly when compared to the MRI dated 05/01/2008. The orbits appear normal. The VIIth/VIIIth nerve complex appears normal. The mastoid air cells appear normal. The paranasal sinuses appear normal. Flow voids are identified within the major intracerebral arteries.   Diffusion weighted images are normal. Susceptibility weighted images show hypointense foci in the left cerebellar hemisphere and the right external capsule. These were not present on T2* images from 05/01/2008.   IMPRESSION: This is an abnormal MRI of the brain without contrast showed the following: 1. Left occipital lobe encephalomalacia consistent with a remote infarction. This is stable when compared to the prior studies. 2. Small left cerebellar hemisphere chronic-appearing stroke not present on CT scan dated 06/19/2012 and some scattered microvascular ischemic changes in the cerebellar hemispheres. 3. Very extensive single and confluent foci in both hemispheres  consistent with severe chronic microvascular ischemic changes. There has been only mild progression when compared to the MRI dated 05/01/2008. 4. Brain volume is essentially normal for age 3. Susceptibility weighted images show left cerebellar hemisphere and right external capsule foci consistent with remote microhemorrhage. These were not present on T2 star images from 05/01/2008.  Richard A. Felecia Shelling, MD, PhD Certified in Neurology, Clinical Neurophysiology, Sleep Medicine, Pain Medicine and Neuroimaging   Ecg today shows Afib with rate 113. Nonspecific ST-T changes. I have personally reviewed and interpreted this study.  Assessment / Plan: 1. PAF -Continue toprol for rate control. INR 1.6 today. Needs to resume coumadin as outlined by pharmacy.  Recommend continuing current therapy. Rate is a little elevated today. Will increase Toprol XL to 50 mg daily. By piror history she will convert on her own.  2. PFO - on anticoagulation. Stay off ASA since on coumadin.  3. COPD   4. Hypertension -  Elevated today but she is having considerable pain today. Will monitor with increased Toprol dose.   5. Aortic root enlargement. 44 mm by Echo.   6. Abnormal Ecg with chronic T wave changes.  7. Diffuse arthragias. Seeing rheumatology today.

## 2015-12-20 NOTE — Progress Notes (Signed)
patient refused emg/ncs due to $56 cost. She left without being seen. Upset, Said she was going to have it done at the hospital where it would be 100% covered.

## 2015-12-20 NOTE — Telephone Encounter (Signed)
error 

## 2015-12-27 ENCOUNTER — Other Ambulatory Visit: Payer: Self-pay | Admitting: Emergency Medicine

## 2016-01-06 ENCOUNTER — Ambulatory Visit (INDEPENDENT_AMBULATORY_CARE_PROVIDER_SITE_OTHER): Payer: Medicare Other | Admitting: Pharmacist Clinician (PhC)/ Clinical Pharmacy Specialist

## 2016-01-06 DIAGNOSIS — Z7901 Long term (current) use of anticoagulants: Secondary | ICD-10-CM | POA: Diagnosis not present

## 2016-01-06 DIAGNOSIS — I480 Paroxysmal atrial fibrillation: Secondary | ICD-10-CM

## 2016-01-06 LAB — POCT INR: INR: 1.7

## 2016-01-09 ENCOUNTER — Other Ambulatory Visit: Payer: Self-pay | Admitting: Emergency Medicine

## 2016-01-14 ENCOUNTER — Encounter: Payer: Self-pay | Admitting: Internal Medicine

## 2016-01-14 ENCOUNTER — Ambulatory Visit (INDEPENDENT_AMBULATORY_CARE_PROVIDER_SITE_OTHER): Payer: Medicare Other | Admitting: Internal Medicine

## 2016-01-14 VITALS — BP 126/72 | HR 74 | Ht 64.0 in | Wt 196.2 lb

## 2016-01-14 DIAGNOSIS — R0789 Other chest pain: Secondary | ICD-10-CM | POA: Diagnosis not present

## 2016-01-14 DIAGNOSIS — J441 Chronic obstructive pulmonary disease with (acute) exacerbation: Secondary | ICD-10-CM | POA: Diagnosis not present

## 2016-01-14 DIAGNOSIS — J449 Chronic obstructive pulmonary disease, unspecified: Secondary | ICD-10-CM | POA: Insufficient documentation

## 2016-01-14 MED ORDER — PREDNISONE 10 MG PO TABS: ORAL_TABLET | ORAL | Status: DC

## 2016-01-14 NOTE — Patient Instructions (Addendum)
ICD-9-CM ICD-10-CM   1. Chronic obstructive pulmonary disease, unspecified COPD, unspecified chronic bronchitis type 496 J44.9   2. COPD exacerbation ([place]) 491.21 J44.1   3. Atypical chest pain 786.59 R07.89     Possible COPD exacerbation due to pollen Unclear current severity of COPD Unclear cause of atypical chest pain but likely due to fibromyalgia NAsal irritation due to o2   Plan - attempt humidified o2 due to nasal irritation - Please take prednisone 40 mg x1 day, then 30 mg x1 day, then 20 mg x1 day, then 10 mg x1 day, and then 5 mg x1 day and stop - CT chest without contrast - Full pulmonary function test  Follow-up - Finish the above 2 tests and return to see nurse practitioner in the next few weeks - Possible cardiac evaluation if the above are noncontributory

## 2016-01-14 NOTE — Progress Notes (Signed)
Subjective:     Patient ID: Laurie Horton, female   DOB: [birth year], 70 y.o.   MRN: [id number]  HPI   1. Smoker -  quit August 2011  reports that she quit smoking about 2 years ago. She does not have any smokeless tobacco history on file.   2. COPD   - with small PFO, exertional desat as of Sept 2011   - isolated low dlco on PFT Oct 2012 with emphysema on CT chest:  PFT 08/28/11 - shows isolated low dlco only 43%, Fev1 2.111/100%, ratio 60. - using oxygen 2013 following years of refusal  3. LLL pulmonary micronodule 0.6cm with subcentimeter mediastinal nodes  - dec 2010, aug 2011, oct 2012 - no further followup  4. Admissions for Resp failure/AECOPD   - Post p-lap for gall bladder - complicated by ICU resp failure and pneumonia LLL - March 2011, resolved on CT aug 2011 - RML PNA Sept 2012; resolved on CT oct 2012  5. Recurrent AECOPD  - aug/sept 2012 - Dr Everlene Farrier opd Rx - oct 2012 (office rx)  6. Poor health literacy, poly pharmacy, lives alone, pscyh issues  7. Code Status:  --DNAR, DNAI including no short term ventilation  - Oct 2012 pulm office visit. Thinks she will be dead by 2012-09-15 - changed to full code - July 2014 but " do not want to be a vegetable"    OV 08/01/2012  COPD - mild yellow sputum, increase cough and increased wheeze past  2 weeks. No change in dyspnea or effort tolerance. CAT score 20. Vaccine - pneumovax 2007 prior to age 7. FLu - not had yet. Reluctantly agreed to have both. She is worried she had 'flu' afeter flu shot in 2012-2013 season but I explained advantages and limitations of flu shot and she agreed.   Smoking - still in remission  #COPD You have mild attack of copd called COPD exacerbation Please take biaxin per your request No need for prednisone burst at this point. However, if this gets worse you might need prednisone but right now you do not Have flu shot and pneumovax vaccine 08/01/2012 today Continue your medications and  oxygen  #Smoking  - glad you are still quit  #Followup  9 months or sooner if needed  OV 05/02/2013  Routine COPD followup. Feels baseline. Compliant with nebs and oxygen. Smoking in remission. COPD cat score down to 15  New problem: wants pre-op resp clearance for some kind of spinalprocedure. DEscribing L2-L5 radiculopathy and says Dr Maia Petties is going to "burn nerve roots" because she is refractory or only has temp re;lief with epidural shots. Wants permanent relief. PCP DAUB, STEVE A, MD has cuationed against surgerygiven prior experience with post op resp failure. I did speak to Dr  Hope Pigeon. Saullo ahd he described 30 min RFA procedure on back with lidocaine +/- moderate sedation as tye typical procedure. She has had problems with moderate sedation before.  Preop risk assessment scores without use of sedation shows that her risk for pulmonary complications for this procedure is LOW    #COPD  - stable disease  - continue oxygen and nebs  - next visit do full PFT breathing test  #Preoperative pulmonray evaluation  - will talk to Dr Maia Petties; my nurse will reach his office now [phone]  - Will call you after I talk to him  #Followup  - 3 months with full PFT     OV 08/14/2013  Followup COPD. She says  she is stable. However, Score is 23.5 and appears worse than baseline. She says she is compliant with her ipratropium nebulizer and oxygen. He may function test today 08/14/2013 shows normal spirometry with FEV1 of 1.8 L/82%. FVC of 2.9 L 100%. Total lung capacity of 4.3 L/80%. She only has isolated reduction in diffusion capacity of 11.6/50%. She will have a flu shot today. She says last year she had a flu shot recommended by being and then she was in bed for 2 days but she denies any allergy to reaction and she is consented for a flu shot this time   OV 06/27/2014 - ACUTE VISIT  Chief Complaint  Patient presents with  . Acute Visit    Pt states her fibro myalgia is bothering her  chest wall. Pt states she has at times had to increase her O2 due to the heat. Pt c/o prod cough with cream colored mucous.Pt states when she takes a deep breath is hurts.    NOt seen her in 10 months. Acute visit now. Several months fibromylagia worse. Several weeks fibromyalgia in chest wall. Associated with this increased dyspnea to a moderate severity. She not sure if cough is worse but sputum is slightly different color x 2 weeks than baseline. Spurum volume might be same but she is not sure. Denies wheezing, fever, chills. USes nebsd and o2    OV 01/14/2016  Chief Complaint  Patient presents with  . Follow-up    Pt states her breathing has worsened since last OV. Pt states her fibromyalgia is now affecting her chest - making it harder to breathe. Pt c/o prod cough with white mucus - pt contributes this to pollen.    emphysema patient with severe reduction in diffusion capacity. I personally saw her in 2015. Then in summer 2016 she saw a Designer, jewellery. She presents for routine follow-up. At this point she says that her fibromyalgia is making her COPD symptoms worse. When she takes a deep breath she has chest tightness. When she walks without taking a deep breath and using oxygen she feels baseline. The past one week because of increased pollen levels according to her she's having increased wheezing and chest tightness and shortness of breath. Certainly this atypical chest pain she is having is new in the time that I've seen her.  Review of the chart show that her last pulmonary function tests and CT scan of the chest was many years ago. Last chest x-ray was in 2015.    CAT COPD Symptom & Quality of Life Score ([employer] trademark) 0 is no burden. 5 is highest burden 08/01/2012 She is in aecopd 05/02/2013  06/27/2014   Never Cough -> Cough all the time 3 2 3   No phlegm in chest -> Chest is full of phlegm 3 0 4  No chest tightness -> Chest feels very tight 3 0 5, due to fibromyalgia per hx  No  dyspnea for 1 flight stairs/hill -> Very dyspneic for 1 flight of stairs 2 5 3   No limitations for ADL at home -> Very limited with ADL at home 1 3 2   Confident leaving home -> Not at all confident leaving home 1 0 0  Sleep soundly -> Do not sleep soundly because of lung condition 3 0 3  Lots of Energy -> No energy at all 4 5 4   TOTAL Score (max 40)  20 15 24     Review of Systems     Objective:   Physical Exam  Constitutional: She is oriented to person, place, and time. She appears well-developed and well-nourished. No distress.  HENT:  Head: Normocephalic and atraumatic.  Right Ear: External ear normal.  Left Ear: External ear normal.  Mouth/Throat: Oropharynx is clear and moist. No oropharyngeal exudate.  Dry irritated nasal mucosa in little's area  Eyes: Conjunctivae and EOM are normal. Pupils are equal, round, and reactive to light. Right eye exhibits no discharge. Left eye exhibits no discharge. No scleral icterus.  Neck: Normal range of motion. Neck supple. No JVD present. No tracheal deviation present. No thyromegaly present.  Cardiovascular: Normal rate, regular rhythm, normal heart sounds and intact distal pulses.  Exam reveals no gallop and no friction rub.   No murmur heard. Pulmonary/Chest: Effort normal and breath sounds normal. No respiratory distress. She has no wheezes. She has no rales. She exhibits no tenderness.  Abdominal: Soft. Bowel sounds are normal. She exhibits no distension and no mass. There is no tenderness. There is no rebound and no guarding.  Musculoskeletal: Normal range of motion. She exhibits no edema or tenderness.  Lymphadenopathy:    She has no cervical adenopathy.  Neurological: She is alert and oriented to person, place, and time. She has normal reflexes. No cranial nerve deficit. She exhibits normal muscle tone. Coordination normal.  Skin: Skin is warm and dry. No rash noted. She is not diaphoretic. No erythema. No pallor.  Psychiatric: She has a  normal mood and affect. Her behavior is normal. Judgment and thought content normal.  Vitals reviewed.   Filed Vitals:   01/14/16 0901  BP: 126/72  Pulse: 74  Height: 5\' 4"  (1.626 m)  Weight: 196 lb 3.2 oz (88.996 kg)  SpO2: 90%   Estimated body mass index is 33.66 kg/(m^2) as calculated from the following:   Height as of this encounter: 5\' 4"  (1.626 m).   Weight as of this encounter: 196 lb 3.2 oz (88.996 kg).         Assessment:       ICD-9-CM ICD-10-CM   1. Chronic obstructive pulmonary disease, unspecified COPD, unspecified chronic bronchitis type 496 J44.9   2. COPD exacerbation ([place]) 491.21 J44.1   3. Atypical chest pain 786.59 R07.89        Plan:       Possible COPD exacerbation due to pollen Unclear current severity of COPD Unclear cause of atypical chest pain but likely due to fibromyalgia NAsal irritation due to o2   Plan - attempt humidified o2 due to nasal irritation - Please take prednisone 40 mg x1 day, then 30 mg x1 day, then 20 mg x1 day, then 10 mg x1 day, and then 5 mg x1 day and stop - CT chest without contrast - Full pulmonary function test  Follow-up - Finish the above 2 tests and return to see nurse practitioner in the next few weeks - Possible cardiac evaluation if the above are noncontributory;    Dr. Brand Males, M.D., [facility].C.P Pulmonary and Critical Care Medicine Staff Physician [facility] Pager: [phone], If no answer or between  15:00h - 7:00h: call [phone]  01/14/2016 9:16 AM

## 2016-01-20 ENCOUNTER — Ambulatory Visit (HOSPITAL_COMMUNITY)
Admission: RE | Admit: 2016-01-20 | Discharge: 2016-01-20 | Disposition: A | Discharge status: Home / Self Care | Payer: Medicare Other | Source: Ambulatory Visit | Attending: Internal Medicine | Admitting: Internal Medicine

## 2016-01-20 ENCOUNTER — Ambulatory Visit (INDEPENDENT_AMBULATORY_CARE_PROVIDER_SITE_OTHER): Payer: Medicare Other | Admitting: Pharmacist Clinician (PhC)/ Clinical Pharmacy Specialist

## 2016-01-20 DIAGNOSIS — Z7901 Long term (current) use of anticoagulants: Secondary | ICD-10-CM | POA: Diagnosis not present

## 2016-01-20 DIAGNOSIS — R0789 Other chest pain: Secondary | ICD-10-CM

## 2016-01-20 DIAGNOSIS — J449 Chronic obstructive pulmonary disease, unspecified: Secondary | ICD-10-CM

## 2016-01-20 DIAGNOSIS — I480 Paroxysmal atrial fibrillation: Secondary | ICD-10-CM | POA: Diagnosis not present

## 2016-01-20 DIAGNOSIS — J441 Chronic obstructive pulmonary disease with (acute) exacerbation: Secondary | ICD-10-CM | POA: Diagnosis present

## 2016-01-20 LAB — POCT INR: INR: 2.7

## 2016-01-20 LAB — PULMONARY FUNCTION TEST
DL/VA % pred: 51 %
DL/VA: 2.49 ml/min/mmHg/L
DLCO unc % pred: 40 %
DLCO unc: 9.75 ml/min/mmHg
FEF 25-75 PRE: 0.91 L/s
FEF2575-%Pred-Pre: 48 %
FEV1-%PRED-PRE: 82 %
FEV1-PRE: 1.86 L
FEV1FVC-%Pred-Pre: 87 %
FEV6-%Pred-Pre: 95 %
FEV6-PRE: 2.74 L
FEV6FVC-%Pred-Pre: 102 %
FVC-%Pred-Pre: 93 %
FVC-Pre: 2.79 L
PRE FEV1/FVC RATIO: 67 %
Pre FEV6/FVC Ratio: 98 %
RV % PRED: 78 %
RV: 1.72 L
TLC % pred: 91 %
TLC: 4.62 L

## 2016-01-21 ENCOUNTER — Ambulatory Visit (INDEPENDENT_AMBULATORY_CARE_PROVIDER_SITE_OTHER)
Admission: RE | Admit: 2016-01-21 | Discharge: 2016-01-21 | Disposition: A | Discharge status: Home / Self Care | Payer: Medicare Other | Source: Ambulatory Visit | Attending: Internal Medicine | Admitting: Internal Medicine

## 2016-01-21 DIAGNOSIS — J441 Chronic obstructive pulmonary disease with (acute) exacerbation: Secondary | ICD-10-CM

## 2016-01-21 DIAGNOSIS — J449 Chronic obstructive pulmonary disease, unspecified: Secondary | ICD-10-CM

## 2016-01-21 DIAGNOSIS — R0789 Other chest pain: Secondary | ICD-10-CM

## 2016-01-27 ENCOUNTER — Telehealth: Payer: Self-pay | Admitting: Internal Medicine

## 2016-01-27 NOTE — Telephone Encounter (Signed)
Called and spoke to pt. Informed her of the results and recs per MR. Pt verbalized understanding. Pt states her f/u is with TP (on 4/4) and just wanted to let MR know.   Will forward to MR as FYI.

## 2016-01-27 NOTE — Telephone Encounter (Addendum)
IMPRESSION: 1. Moderate centrilobular emphysema and diffuse bronchial wall thickening, in keeping with the provided history of COPD. No acute consolidative airspace disease. 2. Mild subpleural density in the dependent lower lobes, nonspecific, most likely to represent hypoventilatory change. If there is clinical concern for interstitial lung disease, a follow-up high-resolution chest CT study could be performed with prone images. 3. Left main and 3 vessel coronary atherosclerosis. 4. Dilated main pulmonary artery, in keeping with pulmonary arterial hypertension. 5. Hypodense subcentimeter right thyroid lobe nodule, for which no further follow-up is required. This follows ACR consensus guidelines: Managing Incidental Thyroid Nodules Detected on Imaging: White Paper of the ACR Incidental Thyroid Findings Committee. J Am Coll Radiol 2015; 12:143-150.   Electronically Signed By: Ilona Sorrel M.D. On: 01/21/2016 09:00               Ct chet does not show any cancer. Shows emphysema.  PFT overal stable since 2014 - some decline in oxygen transfer capaticty - possibly age related but might be related to possible inflammation in bottom part of lung. When she come back for fu I will re-examine chest and decide on followup v spl type CT chest - HRCT

## 2016-02-04 ENCOUNTER — Ambulatory Visit (INDEPENDENT_AMBULATORY_CARE_PROVIDER_SITE_OTHER): Payer: Medicare Other | Admitting: Adult Health

## 2016-02-04 ENCOUNTER — Ambulatory Visit: Payer: Medicare Other | Admitting: Emergency Medicine

## 2016-02-04 ENCOUNTER — Encounter: Payer: Self-pay | Admitting: Adult Health

## 2016-02-04 VITALS — BP 118/72 | HR 70 | Ht 64.0 in | Wt 197.6 lb

## 2016-02-04 DIAGNOSIS — J9611 Chronic respiratory failure with hypoxia: Secondary | ICD-10-CM

## 2016-02-04 DIAGNOSIS — J449 Chronic obstructive pulmonary disease, unspecified: Secondary | ICD-10-CM | POA: Diagnosis not present

## 2016-02-04 MED ORDER — IPRATROPIUM BROMIDE 0.02 % IN SOLN: 0.5000 mg | Freq: Four times a day (QID) | RESPIRATORY_TRACT | Status: DC

## 2016-02-04 NOTE — Assessment & Plan Note (Signed)
COPD appears stable ,  PFT shows stable lung dz  CT chest shows mod emphysema.   Plan  Restart Ipatropium neb 3-4 times daily .  Continue on Oxygen 1.5L rest and 2l/m activity.  Follow up Dr. [name] in 3-4 months and As needed

## 2016-02-04 NOTE — Assessment & Plan Note (Signed)
Continue on Oxygen 1.5L rest and 2l/m activity.  Follow up Dr. [name] in 3-4 months and As needed

## 2016-02-04 NOTE — Patient Instructions (Signed)
Restart Ipatropium neb 3-4 times daily .  Continue on Oxygen 1.5L rest and 2l/m activity.  Follow up Dr. [name] in 3-4 months and As needed

## 2016-02-04 NOTE — Progress Notes (Signed)
Subjective:    Patient ID: Laurie Horton, female    DOB: 27-Sep-1946, 70 y.o.   MRN: [id number]  HPI 70 yo female former smoker with COPD ,  LLL pulmonary nodule stable on CT x 2 yr    02/04/2016 Follow up : COPD  Pt returns for 2 week follow up .  PVX and Prevnar utd.  PFT shows FEV1 82%, ratio 67, FVC 93 DLCO 40% CT chest shows mod Emphysema. Mild subpleural density in lower lobes is non specific.  Intolerant to pulmicort causes mouth blisters.  Has taken Ipratropium in past , needs refill, hers is expired.  We discussed med compliance .  Has multiple drug allergies . Says she has tried several inhalers in past with intolerances.  She denies chest pain , orthopnea, edema or fever.  Remains on O2 at 1.5l/m    Past Medical History  Diagnosis Date  . Depression   . GERD (gastroesophageal reflux disease)   . HTN (hypertension)   . Hiatal hernia   . COPD (chronic obstructive pulmonary disease) ([place])   . Diverticulitis   . DVT (deep venous thrombosis) ([place])   . Obesity   . Hyperlipidemia   . PFO (patent foramen ovale)   . PAT (paroxysmal atrial tachycardia) ([place])   . Right middle lobe pneumonia 07/24/2011    First noted at admit 07/10/11. Persists on cxr 07/22/11. Cleared on CT 08/24/11. No further followup  . PULMONARY NODULE, LEFT LOWER LOBE 10/14/2009    22mm LLL nodule dec 2010. Stable and 12mm in Oct 2012. No further fu  . TOBACCO ABUSE 06/04/2009  . Osteoporosis   . Fibromyalgia   . Stroke ([place])   . History of deviated nasal septum     left- side  . On supplemental oxygen therapy     concentrator at night @ 1.5 l/m or when sleeps. O2 Sat niormally 87.  . Complication of anesthesia     various issues with oxygen  saturations post op    . Current Outpatient Prescriptions on File Prior to Visit  Medication Sig Dispense Refill  . albuterol (PROVENTIL) (2.5 MG/3ML) 0.083% nebulizer solution Take 3 mLs (2.5 mg total) by nebulization every 6 (six) hours as needed for wheezing  or shortness of breath. 75 mL 5  . albuterol (VENTOLIN HFA) 108 (90 BASE) MCG/ACT inhaler Inhale 2 puffs into the lungs every 4 (four) hours as needed for wheezing (rescue inhaler). 8.5 Inhaler 5  . budesonide (PULMICORT) 0.25 MG/2ML nebulizer solution Take 2 mLs (0.25 mg total) by nebulization every 4 (four) hours. 60 mL 5  . desvenlafaxine (PRISTIQ) 50 MG 24 hr tablet Take 1 tablet (50 mg total) by mouth daily. 30 tablet 2  . diazepam (VALIUM) 2 MG tablet Take 1 tablet (2 mg total) by mouth as needed for anxiety. 30 tablet 0  . HYDROcodone-acetaminophen (NORCO/VICODIN) 5-325 MG per tablet Take 1 tablet by mouth 2 (two) times daily.    . metoprolol succinate (TOPROL XL) 50 MG 24 hr tablet Take 1 tablet (50 mg total) by mouth daily. Take with or immediately following a meal. 30 tablet 11  . montelukast (SINGULAIR) 10 MG tablet TAKE 1 TABLET BY MOUTH AT BEDTIME 90 tablet 0  . NORVASC 5 MG tablet TAKE 1 TABLET BY MOUTH EVERY DAY 30 tablet 2  . ondansetron (ZOFRAN-ODT) 4 MG disintegrating tablet Take 1 tablet (4 mg total) by mouth every 8 (eight) hours as needed for nausea or vomiting. 20 tablet 0  .  oxybutynin (DITROPAN-XL) 10 MG 24 hr tablet Take 10 mg by mouth every morning.   3  . OXYGEN Inhale 1.5 L into the lungs. For naps and sleep.    . pantoprazole (PROTONIX) 40 MG tablet Take 1 tablet by mouth every morning.     . polyethylene glycol (MIRALAX / GLYCOLAX) packet Take 17 g by mouth daily as needed for mild constipation.     . pregabalin (LYRICA) 50 MG capsule Take 50 mg by mouth 2 (two) times daily. ta    . tiZANidine (ZANAFLEX) 4 MG tablet Take 1 mg by mouth daily as needed for muscle spasms.     . traZODone (DESYREL) 100 MG tablet Take 1 tablet (100 mg total) by mouth at bedtime. 30 tablet 2  . warfarin (COUMADIN) 5 MG tablet Take 1 tablet by mouth daily or as directed by coumadin clinic 30 tablet 3   No current facility-administered medications on file prior to visit.      Review of  Systems Constitutional:   No  weight loss, night sweats,  Fevers, chills, + fatigue, or  lassitude.  HEENT:   No headaches,  Difficulty swallowing,  Tooth/dental problems, or  Sore throat,                No sneezing, itching, ear ache, nasal congestion, post nasal drip,   CV:  No chest pain,  Orthopnea, PND, swelling in lower extremities, anasarca, dizziness, palpitations, syncope.   GI  No heartburn, indigestion, abdominal pain, nausea, vomiting, diarrhea, change in bowel habits, loss of appetite, bloody stools.   Resp:    No chest wall deformity  Skin: no rash or lesions.  GU: no dysuria, change in color of urine, no urgency or frequency.  No flank pain, no hematuria   MS:  No joint pain or swelling.  No decreased range of motion.  No back pain.  Psych:  No change in mood or affect. No depression or anxiety.  No memory loss.         Objective:   Physical Exam Filed Vitals:   02/04/16 0900  BP: 118/72  Pulse: 70  Height: 5\' 4"  (1.626 m)  Weight: 197 lb 9.6 oz (89.631 kg)  SpO2: 95%  GEN: A/Ox3; pleasant , NAD, on O2 , elderly   HEENT:  [place]/AT,  EACs-clear, TMs-wnl, NOSE-clear, THROAT-clear, no lesions, no postnasal drip or exudate noted.   NECK:  Supple w/ fair ROM; no JVD; normal carotid impulses w/o bruits; no thyromegaly or nodules palpated; no lymphadenopathy.  RESP  Decreased BS in bases .no accessory muscle use, no dullness to percussion  CARD:  RRR, no m/r/g  , no peripheral edema, pulses intact, no cyanosis or clubbing.  GI:   Soft & nt; nml bowel sounds; no organomegaly or masses detected.  Musco: Warm bil, no deformities or joint swelling noted.   Neuro: alert, no focal deficits noted.    Skin: Warm, no lesions or rashes  Tammy Parrett NP-C  [facility]  02/04/2016        Assessment & Plan:

## 2016-02-06 ENCOUNTER — Ambulatory Visit (INDEPENDENT_AMBULATORY_CARE_PROVIDER_SITE_OTHER): Payer: Medicare Other | Admitting: Emergency Medicine

## 2016-02-06 ENCOUNTER — Encounter: Payer: Self-pay | Admitting: Emergency Medicine

## 2016-02-06 VITALS — BP 136/90 | HR 72 | Temp 98.0°F | Resp 16 | Ht 63.5 in | Wt 194.0 lb

## 2016-02-06 DIAGNOSIS — G894 Chronic pain syndrome: Secondary | ICD-10-CM | POA: Diagnosis not present

## 2016-02-06 DIAGNOSIS — R739 Hyperglycemia, unspecified: Secondary | ICD-10-CM

## 2016-02-06 DIAGNOSIS — R112 Nausea with vomiting, unspecified: Secondary | ICD-10-CM | POA: Diagnosis not present

## 2016-02-06 DIAGNOSIS — J449 Chronic obstructive pulmonary disease, unspecified: Secondary | ICD-10-CM

## 2016-02-06 DIAGNOSIS — M797 Fibromyalgia: Secondary | ICD-10-CM

## 2016-02-06 LAB — POCT GLYCOSYLATED HEMOGLOBIN (HGB A1C): Hemoglobin A1C: 6

## 2016-02-06 MED ORDER — HYDROCODONE-ACETAMINOPHEN 5-325 MG PO TABS: ORAL_TABLET | ORAL | Status: DC

## 2016-02-06 MED ORDER — ONDANSETRON 4 MG PO TBDP: 4.0000 mg | ORAL_TABLET | Freq: Three times a day (TID) | ORAL | Status: DC | PRN

## 2016-02-06 NOTE — Progress Notes (Signed)
Patient ID: Laurie Horton, female   DOB: 1946-10-03, 70 y.o.   MRN: [id number]    By signing my name below, I, Laurie Horton, attest that this documentation has been prepared under the direction and in the presence of [name], MD Electronically Signed: Ladene Artist, ED Scribe 02/06/2016 at 11:53 AM.  Chief Complaint:  Chief Complaint  Patient presents with  . Follow-up    BLOOD PRESSURE  . Medication Refill    zofran   HPI: Laurie Horton is a 70 y.o. female, with a h/o HTN and fibromyalgia, who reports to [facility] today for a follow-up regarding HTN. Triage BP: 136/90.  Pulmonary  Pt received a call last week from Brand Males, MD and was diagnosed with Stage 1 Emphysema. Her nebulizer was switched and she is waiting a call from [facility] to pick up her medication. She will use the nebulizer x3 daily. Pt is a former smoker; quit in August 2011.   Norco  Pt states that she was seen in the office ~2 months ago for a dental abscess. She followed up with a dental surgeon and was started on Oxycodone prior to surgery by the surgeon. She states that her rheumatologist Bo Merino, MD received a call from [employer] regarding oxycodone. She further reports that Dr. Rosann Auerbach refused to continue to prescribe Norco for fibromyalgia since she violated her pain contract. Pt has been taking Norco x2 daily for 5 years. She states that she recently cut down to 1 tablet daily since she was dropped and has noticed elevated BP and anxiety. She states that she has enough Norco to last her to 02/19/16 and has a follow-up appointment with Zonia Kief, Md on 03/05/16 who she hopes will agree to prescribe Norco for her.   Medication Refill Pt requests a medication refill of Zofran at this time.   Past Medical History  Diagnosis Date  . Depression   . GERD (gastroesophageal reflux disease)   . HTN (hypertension)   . Hiatal hernia   . COPD (chronic obstructive pulmonary disease) ([place])   .  Diverticulitis   . DVT (deep venous thrombosis) ([place])   . Obesity   . Hyperlipidemia   . PFO (patent foramen ovale)   . PAT (paroxysmal atrial tachycardia) ([place])   . Right middle lobe pneumonia 07/24/2011    First noted at admit 07/10/11. Persists on cxr 07/22/11. Cleared on CT 08/24/11. No further followup  . PULMONARY NODULE, LEFT LOWER LOBE 10/14/2009    44mm LLL nodule dec 2010. Stable and 53mm in Oct 2012. No further fu  . TOBACCO ABUSE 06/04/2009  . Osteoporosis   . Fibromyalgia   . Stroke ([place])   . History of deviated nasal septum     left- side  . On supplemental oxygen therapy     concentrator at night @ 1.5 l/m or when sleeps. O2 Sat niormally 87.  . Complication of anesthesia     various issues with oxygen  saturations post op   Past Surgical History  Procedure Laterality Date  . Total abdominal hysterectomy      post op needed oxygen was told "she gave them a scare"  . Knee arthroscopy  2000    left  . Laparoscopic cholecystectomy  04-16-2010    [facility]  . Tubal ligation    . US echocardiography  11/20/2009    EF 55-60%  . Cardiovascular stress test  12/26/2004    EF 74%. NO EVIDENCE OF ISCHEMIA  . Mouth  surgery      03-26-15 multiple extractions stitches remains  . Esophagogastroduodenoscopy (egd) with propofol N/A 04/15/2015    Procedure: ESOPHAGOGASTRODUODENOSCOPY (EGD) WITH PROPOFOL;  Surgeon: Laurence Spates, MD;  Location: [facility];  Service: Endoscopy;  Laterality: N/A;   Social History   Social History  . Marital Status: Divorced    Spouse Name: N/A  . Number of Children: 2  . Years of Education: N/A   Occupational History  . disability    Social History Main Topics  . Smoking status: Former Smoker    Quit date: 06/02/2010  . Smokeless tobacco: Never Used  . Alcohol Use: No  . Drug Use: No  . Sexual Activity: Yes   Other Topics Concern  . None   Social History Narrative   Family History  Problem Relation Age of Onset  . Dementia Mother   .  Diabetes Mother   . Alzheimer's disease Mother   . Heart attack Brother 76  . Schizophrenia Sister   . Diabetes Sister   . Tremor Sister   . Heart attack Father    Allergies  Allergen Reactions  . Alprazolam     REACTION: stops breathing  . Pseudoephedrine Hcl Er Shortness Of Breath  . Budesonide-Formoterol Fumarate     bliusters  . Aciphex [Rabeprazole Sodium]     rash  . Avelox [Moxifloxacin Hcl In Nacl]     Stomach cramps.   . Esomeprazole Magnesium     REACTION: "bouncing off walls"  . Flonase [Fluticasone Propionate]   . Iodine     REACTION: swelling in throat  . Loratadine     claritin D causes shaking  . Lotrimin [Clotrimazole] Other (See Comments)    blisters  . Lunesta [Eszopiclone]     REACTION: "slept for a week"  . Other     Glue from ekg/heart monitor leads --rash +  Any MYCINS  . Oxcarbazepine Other (See Comments)    Causes deep sleep and dizziness  . Zolpidem Tartrate     REACTION: "slept for a week"  . Bextra [Valdecoxib] Rash  . Ceclor [Cefaclor] Rash  . Covera-Hs [Verapamil Hcl] Palpitations  . Dicyclomine Hcl Rash  . Tessalon Perles Rash   Prior to Admission medications   Medication Sig Start Date End Date Taking? Authorizing Provider  albuterol (PROVENTIL) (2.5 MG/3ML) 0.083% nebulizer solution Take 3 mLs (2.5 mg total) by nebulization every 6 (six) hours as needed for wheezing or shortness of breath. 06/21/15   Tammy S Parrett, NP  albuterol (VENTOLIN HFA) 108 (90 BASE) MCG/ACT inhaler Inhale 2 puffs into the lungs every 4 (four) hours as needed for wheezing (rescue inhaler). 04/02/15   [name], MD  desvenlafaxine (PRISTIQ) 50 MG 24 hr tablet Take 1 tablet (50 mg total) by mouth daily. 12/04/15   Kathlee Nations, MD  diazepam (VALIUM) 2 MG tablet Take 1 tablet (2 mg total) by mouth as needed for anxiety. 12/04/15   Kathlee Nations, MD  HYDROcodone-acetaminophen (NORCO/VICODIN) 5-325 MG per tablet Take 1 tablet by mouth 2 (two) times daily.     Historical Provider, MD  ipratropium (ATROVENT) 0.02 % nebulizer solution Take 2.5 mLs (0.5 mg total) by nebulization 4 (four) times daily. 02/04/16   Tammy S Parrett, NP  metoprolol succinate (TOPROL XL) 50 MG 24 hr tablet Take 1 tablet (50 mg total) by mouth daily. Take with or immediately following a meal. 12/20/15   Peter M Martinique, MD  montelukast (SINGULAIR) 10 MG tablet TAKE  1 TABLET BY MOUTH AT BEDTIME 01/10/16   [name], MD  NORVASC 5 MG tablet TAKE 1 TABLET BY MOUTH EVERY DAY 12/28/15   [name], MD  ondansetron (ZOFRAN-ODT) 4 MG disintegrating tablet Take 1 tablet (4 mg total) by mouth every 8 (eight) hours as needed for nausea or vomiting. 11/06/14   [name], MD  oxybutynin (DITROPAN-XL) 10 MG 24 hr tablet Take 10 mg by mouth every morning.  11/28/14   Historical Provider, MD  OXYGEN Inhale 1.5 L into the lungs. For naps and sleep.    Historical Provider, MD  pantoprazole (PROTONIX) 40 MG tablet Take 1 tablet by mouth every morning.  07/24/13   Historical Provider, MD  polyethylene glycol (MIRALAX / GLYCOLAX) packet Take 17 g by mouth daily as needed for mild constipation.     Historical Provider, MD  pregabalin (LYRICA) 50 MG capsule Take 50 mg by mouth 2 (two) times daily. ta 08/03/14   Historical Provider, MD  tiZANidine (ZANAFLEX) 4 MG tablet Take 1 mg by mouth daily as needed for muscle spasms.     Historical Provider, MD  traZODone (DESYREL) 100 MG tablet Take 1 tablet (100 mg total) by mouth at bedtime. 12/04/15   Kathlee Nations, MD  warfarin (COUMADIN) 5 MG tablet Take 1 tablet by mouth daily or as directed by coumadin clinic 08/26/15   Peter M Martinique, MD   ROS: The patient denies fevers, chills, night sweats, unintentional weight loss, chest pain, palpitations, wheezing, dyspnea on exertion, nausea, vomiting, abdominal pain, dysuria, hematuria, melena, numbness, weakness, or tingling.   All other systems have been reviewed and were otherwise negative with the exception of  those mentioned in the HPI and as above.    PHYSICAL EXAM: Filed Vitals:   02/06/16 1100 02/06/16 1111  BP: 130/100 136/90  Pulse: 72   Temp: 98 F (36.7 C)   Resp: 16    Body mass index is 33.82 kg/(m^2).  General: Alert, no acute distress HEENT:  Normocephalic, atraumatic, oropharynx patent. Eye: Juliette Mangle [facility] Cardiovascular: Regular rate and rhythm, no rubs murmurs or gallops. No Carotid bruits, radial pulse intact. No pedal edema.  Respiratory: Decreased breath sounds in both bases with fair air exchange. No cyanosis, no use of accessory musculature Abdominal: No organomegaly, abdomen is soft and non-tender, positive bowel sounds. No masses. Musculoskeletal: Gait intact. No edema, tenderness Skin: No rashes. Neurologic: Facial musculature symmetric. Psychiatric: Patient acts appropriately throughout our interaction. Lymphatic: No cervical or submandibular lymphadenopathy  LABS: Results for orders placed or performed in visit on 02/06/16  POCT glycosylated hemoglobin (Hb A1C)  Result Value Ref Range   Hemoglobin A1C 6.0    EKG/XRAY:   Primary read interpreted by Dr. Everlene Farrier at [facility].  ASSESSMENT/PLAN: Hemoglobin A1c is good. Blood pressure slightly elevated. I did agree to give her 1 prescription for hydrocodone. She sees a back specialist and has injections in her back. She will discuss with him the possibility he will take over her pain medications. I told her we can help her for one month until she gets established with him. She has a prescription for hydrocodone she can fill on 02/19/2016 for 30 tablets.I personally performed the services described in this documentation, which was scribed in my presence. The recorded information has been reviewed and is accurate.    Gross sideeffects, risk and benefits, and alternatives of medications d/w patient. Patient is aware that all medications have potential sideeffects and we are unable to predict  every sideeffect or drug-drug  interaction that may occur.  Arlyss Queen MD 02/06/2016 11:15 AM

## 2016-02-06 NOTE — Patient Instructions (Signed)
     IF you received an x-ray today, you will receive an invoice from [facility]. Please contact [facility] at [phone] with questions or concerns regarding your invoice.   IF you received labwork today, you will receive an invoice from [facility]. Please contact [facility] at [phone] with questions or concerns regarding your invoice.   Our billing staff will not be able to assist you with questions regarding bills from these companies.  You will be contacted with the lab results as soon as they are available. The fastest way to get your results is to activate your My Chart account. Instructions are located on the last page of this paperwork. If you have not heard from us regarding the results in 2 weeks, please contact this office.      

## 2016-02-10 ENCOUNTER — Ambulatory Visit (INDEPENDENT_AMBULATORY_CARE_PROVIDER_SITE_OTHER): Payer: Medicare Other | Admitting: Pharmacist Clinician (PhC)/ Clinical Pharmacy Specialist

## 2016-02-10 DIAGNOSIS — Z7901 Long term (current) use of anticoagulants: Secondary | ICD-10-CM

## 2016-02-10 DIAGNOSIS — I480 Paroxysmal atrial fibrillation: Secondary | ICD-10-CM | POA: Diagnosis not present

## 2016-02-10 LAB — POCT INR: INR: 2.4

## 2016-03-03 ENCOUNTER — Ambulatory Visit (INDEPENDENT_AMBULATORY_CARE_PROVIDER_SITE_OTHER): Payer: Medicare Other | Admitting: Psychiatry

## 2016-03-03 ENCOUNTER — Encounter (HOSPITAL_COMMUNITY): Payer: Self-pay | Admitting: Psychiatry

## 2016-03-03 VITALS — BP 121/89 | HR 66 | Ht 63.0 in | Wt 198.8 lb

## 2016-03-03 DIAGNOSIS — F0630 Mood disorder due to known physiological condition, unspecified: Secondary | ICD-10-CM | POA: Diagnosis not present

## 2016-03-03 MED ORDER — PRISTIQ 50 MG PO TB24: 50.0000 mg | ORAL_TABLET | Freq: Every day | ORAL | Status: DC

## 2016-03-03 MED ORDER — TRAZODONE HCL 100 MG PO TABS: 100.0000 mg | ORAL_TABLET | Freq: Every day | ORAL | Status: DC

## 2016-03-03 NOTE — Progress Notes (Signed)
[facility] [phone] Progress Note  Laurie Horton [id number] 70 y.o.  03/03/2016 11:06 AM  Chief Complaint:  I am very worried.  By Dr. did not give me pain medication.  I may have to go without it.  I'm scared from withdrawal symptoms.  I don't think genetic Pristiq is working.  I want to try brand name.  History of Present Illness:  Patient came for her follow-up appointment.  She is complaining of increased anxiety and nervousness.  Recently she was given oxycodone from her surgeon when she had dental surgery for her abscess.  She was already taking hydrocodone from Dr. Dimas Millin and now she had decided not to give any pain medication because patient broke the contract.  Patient told she was upset because I should have informed the physician that I'm taking another narcotic pain medication from a different doctor.  She was given emergency 30 day supply from her previous primary care physician Dr. Osvaldo Angst .  She has to find a pain specialist and she is scheduled to see but the appointment is not until October.  She is very concerned about having withdrawals after 2 weeks.  She's also reported that generic Pristiq is not working.  She reported increased nervousness, anxiety, crying spells, poor sleep, racing thoughts.  She preferred to go back on brand name.  She had tried Effexor but did not like and having side effects.  She also tried Cymbalta but that did not help her.  She is afraid to try something else.  She is not interested in counseling.  She denies any paranoia or any hallucination.  She denies any suicidal thoughts.  She denies drinking alcohol or using any illegal substances.  Her appetite is okay.  Suicidal Ideation: No Plan Formed: No Patient has means to carry out plan: No  Homicidal Ideation: No Plan Formed: No Patient has means to carry out plan: No  Medical History; Patient has hypertension, fibromyalgia, scoliosis, headache, history of stroke and memory impairment.   She has loss of peripheral vision and weakness on the right side which is minimal.  She has chronic back pain.  Her primary care physician is Dr. Everlene Farrier who is now retiring.  Family History; Patient denies any family history of psychiatric illness.  However her son has history of drug and alcohol problem.  Substance Abuse History; Patient denies any history of drug use.  Past Psychiatric History/Hospitalization(s) Patient denies any previous history of psychiatric inpatient treatment, suicidal attempt, paranoia, hallucination or any mania. She started seeing psychiatrist in 2006 after she had a stroke. She had tried Cymbalta and Celexa in the past. She also tried Ambien, Lunesta and Xanax but developed allergies and side effects Anxiety: Yes Bipolar Disorder: No Depression: Yes Mania: No Psychosis: No Schizophrenia: No Personality Disorder: No Hospitalization for psychiatric illness: No History of Electroconvulsive Shock Therapy: No Prior Suicide Attempts: No   Review of Systems  Constitutional: Negative for weight loss.  HENT: Negative.   Musculoskeletal: Positive for back pain.  Skin: Negative for itching.  Neurological: Positive for tremors. Negative for dizziness.  Psychiatric/Behavioral: Positive for depression and memory loss. The patient is nervous/anxious and has insomnia.     Psychiatric: Agitation: No Hallucination: No Depressed Mood: Yes Insomnia: Yes Hypersomnia: Yes Altered Concentration: No Feels Worthless: Yes Grandiose Ideas: No Belief In Special Powers: No New/Increased Substance Abuse: No Compulsions: No  Neurologic: Headache: No Seizure: No Paresthesias: No  Outpatient Encounter Prescriptions as of 03/03/2016  Medication Sig  .  diazepam (VALIUM) 2 MG tablet Take 1 tablet (2 mg total) by mouth as needed for anxiety.  Marland Kitchen HYDROcodone-acetaminophen (NORCO/VICODIN) 5-325 MG tablet Take 1 tablet daily  . ipratropium (ATROVENT) 0.02 % nebulizer solution  Take 2.5 mLs (0.5 mg total) by nebulization 4 (four) times daily.  . metoprolol succinate (TOPROL XL) 50 MG 24 hr tablet Take 1 tablet (50 mg total) by mouth daily. Take with or immediately following a meal.  . montelukast (SINGULAIR) 10 MG tablet TAKE 1 TABLET BY MOUTH AT BEDTIME  . NORVASC 5 MG tablet TAKE 1 TABLET BY MOUTH EVERY DAY  . ondansetron (ZOFRAN-ODT) 4 MG disintegrating tablet Take 1 tablet (4 mg total) by mouth every 8 (eight) hours as needed for nausea or vomiting.  Marland Kitchen oxybutynin (DITROPAN-XL) 10 MG 24 hr tablet Take 10 mg by mouth every morning. Reported on 02/06/2016  . OXYGEN Inhale 1.5 L into the lungs. For naps and sleep.  . pantoprazole (PROTONIX) 40 MG tablet Take 1 tablet by mouth every morning.   . polyethylene glycol (MIRALAX / GLYCOLAX) packet Take 17 g by mouth daily as needed for mild constipation.   . pregabalin (LYRICA) 50 MG capsule Take 50 mg by mouth 2 (two) times daily. ta  . PRISTIQ 50 MG 24 hr tablet Take 1 tablet (50 mg total) by mouth daily.  Marland Kitchen tiZANidine (ZANAFLEX) 4 MG tablet Take 1 mg by mouth daily as needed for muscle spasms. Reported on 02/06/2016  . traZODone (DESYREL) 100 MG tablet Take 1 tablet (100 mg total) by mouth at bedtime.  Marland Kitchen warfarin (COUMADIN) 5 MG tablet Take 1 tablet by mouth daily or as directed by coumadin clinic  . [DISCONTINUED] desvenlafaxine (PRISTIQ) 50 MG 24 hr tablet Take 1 tablet (50 mg total) by mouth daily.  . [DISCONTINUED] PRISTIQ 50 MG 24 hr tablet Take 1 tablet (50 mg total) by mouth daily.  . [DISCONTINUED] traZODone (DESYREL) 100 MG tablet Take 1 tablet (100 mg total) by mouth at bedtime.   No facility-administered encounter medications on file as of 03/03/2016.    Recent Results (from the past 2160 hour(s))  POCT INR     Status: None   Collection Time: 12/20/15  8:26 AM  Result Value Ref Range   INR 1.6   POCT INR     Status: None   Collection Time: 01/06/16  8:10 AM  Result Value Ref Range   INR 1.7   POCT INR      Status: None   Collection Time: 01/20/16  8:14 AM  Result Value Ref Range   INR 2.7   Pulmonary Function Test     Status: None   Collection Time: 01/20/16  9:35 AM  Result Value Ref Range   FVC-Pre 2.79 L   FVC-%Pred-Pre 93 %   FEV1-Pre 1.86 L   FEV1-%Pred-Pre 82 %   FEV6-Pre 2.74 L   FEV6-%Pred-Pre 95 %   Pre FEV1/FVC ratio 67 %   FEV1FVC-%Pred-Pre 87 %   Pre FEV6/FVC Ratio 98 %   FEV6FVC-%Pred-Pre 102 %   FEF 25-75 Pre 0.91 L/sec   FEF2575-%Pred-Pre 48 %   RV 1.72 L   RV % pred 78 %   TLC 4.62 L   TLC % pred 91 %   DLCO unc 9.75 ml/min/mmHg   DLCO unc % pred 40 %   DL/VA 2.49 ml/min/mmHg/L   DL/VA % pred 51 %  POCT glycosylated hemoglobin (Hb A1C)     Status: None   Collection Time:  02/06/16 11:51 AM  Result Value Ref Range   Hemoglobin A1C 6.0   POCT INR     Status: None   Collection Time: 02/10/16  8:12 AM  Result Value Ref Range   INR 2.4     Physical Exam: Consitutional ;  BP 121/89 mmHg  Pulse 66  Ht 5\' 3"  (1.6 m)  Wt 198 lb 12.8 oz (90.175 kg)  BMI 35.22 kg/m2  Musculoskeletal: Strength & Muscle Tone: within normal limits Gait & Station: normal Patient leans: N/A   Psychiatric Specialty Exam: General Appearance: Casual  Eye Contact::  Fair  Speech:  Clear and Coherent  Volume:  Decreased  Mood:  Anxious, Depressed and Dysphoric  Affect:  Congruent  Thought Process:  Coherent  Orientation:  Full (Time, Place, and Person)  Thought Content:  Rumination  Suicidal Thoughts:  No  Homicidal Thoughts:  No  Memory:  Immediate;   Fair Recent;   Fair Remote;   Fair  Judgement:  Intact  Insight:  Fair  Psychomotor Activity:  Tremor  Concentration:  Fair  Recall:  [employer] of Knowledge:  Fair  Language:  Good  Akathisia:  No  Handed:  Right  AIMS (if indicated):     Assets:  Communication Skills Desire for Improvement Housing Social Support  ADL's:  Intact  Cognition:  Impaired,  Mild  Sleep:        Review of Psycho-Social Stressors  (1), Review or order clinical lab tests (1), Review and summation of old records (2), Review of Last Therapy Session (1), Review of Medication Regimen & Side Effects (2) and Review of New Medication or Change in Dosage (2)  Assessment: Axis I; major depressive disorder, recurrent.  Posttraumatic stress disorder.  Mood disorder due to general medical condition.  Anxiety disorder NOS , Cognitive disorder NOS  Axis III:  Past Medical History  Diagnosis Date  . Depression   . GERD (gastroesophageal reflux disease)   . HTN (hypertension)   . Hiatal hernia   . COPD (chronic obstructive pulmonary disease) ([place])   . Diverticulitis   . DVT (deep venous thrombosis) ([place])   . Obesity   . Hyperlipidemia   . PFO (patent foramen ovale)   . PAT (paroxysmal atrial tachycardia) ([place])   . Right middle lobe pneumonia 07/24/2011    First noted at admit 07/10/11. Persists on cxr 07/22/11. Cleared on CT 08/24/11. No further followup  . PULMONARY NODULE, LEFT LOWER LOBE 10/14/2009    70mm LLL nodule dec 2010. Stable and 76mm in Oct 2012. No further fu  . TOBACCO ABUSE 06/04/2009  . Osteoporosis   . Fibromyalgia   . Stroke ([place])   . History of deviated nasal septum     left- side  . On supplemental oxygen therapy     concentrator at night @ 1.5 l/m or when sleeps. O2 Sat niormally 87.  . Complication of anesthesia     various issues with oxygen  saturations post op    Plan:  Reassurance given.  Encouraged to see a pain specialist as soon as possible otherwise need to see primary care physician for the management of pain .  We talked about detox from opiates and recommended inpatient if she cannot get any opiates.  I also suggested to write a letter to Dr Dimas Millin explain that pain medicine were given for dental abscess and she never abused them.  Patient is hoping that she can find a pain specialist very soon.  Recommended to continue  trazodone 100 mg at bedtime.  We will switch her antidepressant to Pristiq  brand name 50 mg.  I have provided a coupon.  She is no longer taking Valium every day and she still has a few left.  She does not need a new prescription.  One more time I encourage counseling but patient refused. Discuss serotonin syndrome and EPS.  Discuss safety plan that anytime having active suicidal thoughts or homicidal thoughts and she need to call 911 or go to the local emergency room.  I will see her again in 3 months.  Sheneka Schrom T., MD 03/03/2016

## 2016-03-09 ENCOUNTER — Ambulatory Visit (INDEPENDENT_AMBULATORY_CARE_PROVIDER_SITE_OTHER): Payer: Medicare Other | Admitting: Pharmacist Clinician (PhC)/ Clinical Pharmacy Specialist

## 2016-03-09 DIAGNOSIS — Z7901 Long term (current) use of anticoagulants: Secondary | ICD-10-CM | POA: Diagnosis not present

## 2016-03-09 DIAGNOSIS — I480 Paroxysmal atrial fibrillation: Secondary | ICD-10-CM

## 2016-03-09 LAB — POCT INR: INR: 2.1

## 2016-03-10 ENCOUNTER — Ambulatory Visit: Payer: Medicare Other | Admitting: Neurology

## 2016-03-24 ENCOUNTER — Other Ambulatory Visit: Payer: Self-pay | Admitting: Emergency Medicine

## 2016-04-06 ENCOUNTER — Ambulatory Visit (INDEPENDENT_AMBULATORY_CARE_PROVIDER_SITE_OTHER): Payer: Medicare Other | Admitting: Pharmacist

## 2016-04-06 DIAGNOSIS — Z7901 Long term (current) use of anticoagulants: Secondary | ICD-10-CM | POA: Diagnosis not present

## 2016-04-06 DIAGNOSIS — I480 Paroxysmal atrial fibrillation: Secondary | ICD-10-CM | POA: Diagnosis not present

## 2016-04-06 LAB — POCT INR: INR: 1.3

## 2016-04-06 MED ORDER — WARFARIN SODIUM 5 MG PO TABS: ORAL_TABLET | ORAL | Status: DC

## 2016-04-13 ENCOUNTER — Ambulatory Visit (INDEPENDENT_AMBULATORY_CARE_PROVIDER_SITE_OTHER): Payer: Medicare Other | Admitting: Pharmacist

## 2016-04-13 DIAGNOSIS — Z7901 Long term (current) use of anticoagulants: Secondary | ICD-10-CM | POA: Diagnosis not present

## 2016-04-13 DIAGNOSIS — I480 Paroxysmal atrial fibrillation: Secondary | ICD-10-CM | POA: Diagnosis not present

## 2016-04-13 LAB — POCT INR: INR: 2.2

## 2016-04-17 ENCOUNTER — Telehealth: Payer: Self-pay

## 2016-04-17 NOTE — Telephone Encounter (Signed)
Rn call [name] at [facility] records at [phone]. [name] stated they need a signature on the EEG order dated from 09/2015. The patient already had the test done and was given the results. Rn stated there is a electronic order dated 08/2015 from [facility]. [name] saw the electronic sign signature date 08/2015. [name] stated she will sent this to the coders and if there is any other issue,she will contact us. Rn explain Dr. Leonie Man will be on vacation next week and will not return until June 26.

## 2016-04-19 ENCOUNTER — Other Ambulatory Visit: Payer: Self-pay | Admitting: Emergency Medicine

## 2016-04-27 ENCOUNTER — Ambulatory Visit (INDEPENDENT_AMBULATORY_CARE_PROVIDER_SITE_OTHER): Payer: Medicare Other | Admitting: Pharmacist Clinician (PhC)/ Clinical Pharmacy Specialist

## 2016-04-27 DIAGNOSIS — Z7901 Long term (current) use of anticoagulants: Secondary | ICD-10-CM | POA: Diagnosis not present

## 2016-04-27 DIAGNOSIS — I480 Paroxysmal atrial fibrillation: Secondary | ICD-10-CM | POA: Diagnosis not present

## 2016-04-27 LAB — POCT INR: INR: 1.3

## 2016-05-11 ENCOUNTER — Ambulatory Visit (INDEPENDENT_AMBULATORY_CARE_PROVIDER_SITE_OTHER): Payer: Medicare Other | Admitting: Pharmacist

## 2016-05-11 DIAGNOSIS — Z7901 Long term (current) use of anticoagulants: Secondary | ICD-10-CM | POA: Diagnosis not present

## 2016-05-11 DIAGNOSIS — I480 Paroxysmal atrial fibrillation: Secondary | ICD-10-CM

## 2016-05-11 LAB — POCT INR: INR: 2.3

## 2016-05-11 MED ORDER — WARFARIN SODIUM 5 MG PO TABS: ORAL_TABLET | ORAL | Status: DC

## 2016-05-19 ENCOUNTER — Encounter: Payer: Self-pay | Admitting: Internal Medicine

## 2016-05-19 ENCOUNTER — Ambulatory Visit (INDEPENDENT_AMBULATORY_CARE_PROVIDER_SITE_OTHER): Payer: Medicare Other | Admitting: Internal Medicine

## 2016-05-19 VITALS — BP 134/76 | HR 74 | Ht 63.0 in | Wt 201.0 lb

## 2016-05-19 DIAGNOSIS — J438 Other emphysema: Secondary | ICD-10-CM

## 2016-05-19 DIAGNOSIS — J9611 Chronic respiratory failure with hypoxia: Secondary | ICD-10-CM

## 2016-05-19 NOTE — Progress Notes (Signed)
Subjective:     Patient ID: Laurie Horton, female   DOB: [birth year], 70 y.o.   MRN: [id number]  HPI   OV 05/19/2016  Chief Complaint  Patient presents with  . Follow-up    Pt c/o worsening SOB, prod cough with thick white mucus.      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 Last CT chest March 2017 without any mass and associated mild cor pulmonale  This is a routine follow-up. Last visit was in April 2017 with nurse practitioner. At that time treated for COPD exacerbation according to her history but review of the chart does not show that to be true. At this point in time she says COPD stable although she says she might be in flare up on account of her fibromyalgia. Starting her symptoms out it appears that it is generally stable with dyspnea at baseline and cough with mild sputum at baseline. She is more hobbled by her chronic pain and fibromyalgia. Couple months ago apparently a hydrocodone was discontinued by rheumatology and therefore she is having "withdrawal". She does have a new pain medication physician. She is on gabapentin for fibromyalgia. There are no other new issues. She does not want antibiotics or prednisone for her perceived exacerbation       has a past medical history of Depression; GERD (gastroesophageal reflux disease); HTN (hypertension); Hiatal hernia; COPD (chronic obstructive pulmonary disease) ([place]); Diverticulitis; DVT (deep venous thrombosis) ([place]); Obesity; Hyperlipidemia; PFO (patent foramen ovale); PAT (paroxysmal atrial tachycardia) ([place]); Right middle lobe pneumonia (07/24/2011); PULMONARY NODULE, LEFT LOWER LOBE (10/14/2009); TOBACCO ABUSE (06/04/2009); Osteoporosis; Fibromyalgia; Stroke [facility]); History of deviated nasal septum; On supplemental oxygen therapy; and Complication of anesthesia.   reports that she quit smoking about 5 years ago. She has never used smokeless  tobacco.  Past Surgical History  Procedure Laterality Date  . Total abdominal hysterectomy      post op needed oxygen was told "she gave them a scare"  . Knee arthroscopy  2000    left  . Laparoscopic cholecystectomy  04-16-2010    [facility]  . Tubal ligation    . US echocardiography  11/20/2009    EF 55-60%  . Cardiovascular stress test  12/26/2004    EF 74%. NO EVIDENCE OF ISCHEMIA  . Mouth surgery      03-26-15 multiple extractions stitches remains  . Esophagogastroduodenoscopy (egd) with propofol N/A 04/15/2015    Procedure: ESOPHAGOGASTRODUODENOSCOPY (EGD) WITH PROPOFOL;  Surgeon: Laurence Spates, MD;  Location: [facility] ENDOSCOPY;  Service: Endoscopy;  Laterality: N/A;    Allergies  Allergen Reactions  . Alprazolam     REACTION: stops breathing  . Pseudoephedrine Hcl Er Shortness Of Breath  . Budesonide-Formoterol Fumarate     bliusters  . Aciphex [Rabeprazole Sodium]     rash  . Avelox [Moxifloxacin Hcl In Nacl]     Stomach cramps.   . Esomeprazole Magnesium     REACTION: "bouncing off walls"  . Flonase [Fluticasone Propionate]   . Iodine     REACTION: swelling in throat  . Loratadine     claritin D causes shaking  . Lotrimin [Clotrimazole] Other (See Comments)    blisters  . Lunesta [Eszopiclone]     REACTION: "slept for a week"  . Other     Glue from ekg/heart monitor leads --rash +  Any MYCINS  . Oxcarbazepine Other (See Comments)    Causes deep sleep and dizziness  . Zolpidem Tartrate  REACTION: "slept for a week"  . Bextra [Valdecoxib] Rash  . Ceclor [Cefaclor] Rash  . Covera-Hs [Verapamil Hcl] Palpitations  . Dicyclomine Hcl Rash  . Tessalon Perles Rash    Immunization History  Administered Date(s) Administered  . Influenza Split 08/03/2011, 08/01/2012  . Influenza,inj,Quad PF,36+ Mos 08/14/2013, 09/03/2014, 09/04/2015  . Pneumococcal Conjugate-13 10/01/2014  . Pneumococcal Polysaccharide-23 09/03/1999, 06/02/2006, 08/14/2013  . Td 11/02/1994     Family History  Problem Relation Age of Onset  . Dementia Mother   . Diabetes Mother   . Alzheimer's disease Mother   . Heart attack Brother 35  . Schizophrenia Sister   . Diabetes Sister   . Tremor Sister   . Heart attack Father      Current outpatient prescriptions:  .  diazepam (VALIUM) 2 MG tablet, Take 1 tablet (2 mg total) by mouth as needed for anxiety., Disp: 30 tablet, Rfl: 0 .  gabapentin (NEURONTIN) 300 MG capsule, Take 300 mg by mouth 2 (two) times daily., Disp: , Rfl:  .  ipratropium (ATROVENT) 0.02 % nebulizer solution, Take 2.5 mLs (0.5 mg total) by nebulization 4 (four) times daily. (Patient taking differently: Take 0.5 mg by nebulization daily. ), Disp: 75 mL, Rfl: 6 .  metoprolol succinate (TOPROL XL) 50 MG 24 hr tablet, Take 1 tablet (50 mg total) by mouth daily. Take with or immediately following a meal., Disp: 30 tablet, Rfl: 11 .  montelukast (SINGULAIR) 10 MG tablet, TAKE 1 TABLET BY MOUTH AT BEDTIME, Disp: 90 tablet, Rfl: 0 .  NORVASC 5 MG tablet, TAKE 1 TABLET BY MOUTH EVERY DAY, Disp: 90 tablet, Rfl: 0 .  ondansetron (ZOFRAN-ODT) 4 MG disintegrating tablet, Take 1 tablet (4 mg total) by mouth every 8 (eight) hours as needed for nausea or vomiting. (Patient taking differently: Take 4 mg by mouth every 6 (six) hours as needed for nausea or vomiting. ), Disp: 20 tablet, Rfl: 1 .  oxybutynin (DITROPAN-XL) 10 MG 24 hr tablet, Take 10 mg by mouth every morning. Reported on 02/06/2016, Disp: , Rfl: 3 .  OXYGEN, Inhale 1.5 L into the lungs. For naps and sleep., Disp: , Rfl:  .  pantoprazole (PROTONIX) 40 MG tablet, Take 1 tablet by mouth every morning. , Disp: , Rfl:  .  polyethylene glycol (MIRALAX / GLYCOLAX) packet, Take 17 g by mouth daily as needed for mild constipation. , Disp: , Rfl:  .  PRISTIQ 50 MG 24 hr tablet, Take 1 tablet (50 mg total) by mouth daily., Disp: 30 tablet, Rfl: 2 .  tiZANidine (ZANAFLEX) 4 MG tablet, Take 1 mg by mouth daily as needed for  muscle spasms. Reported on 02/06/2016, Disp: , Rfl:  .  traZODone (DESYREL) 100 MG tablet, Take 1 tablet (100 mg total) by mouth at bedtime., Disp: 30 tablet, Rfl: 2 .  warfarin (COUMADIN) 5 MG tablet, Take 1 tablet by mouth daily or as directed by coumadin clinic, Disp: 30 tablet, Rfl: 1    Review of Systems     Objective:   Physical Exam Filed Vitals:   05/19/16 0858  BP: 134/76  Pulse: 74  Height: 5\' 3"  (1.6 m)  Weight: 201 lb (91.173 kg)  SpO2: 94%   General exam obese female. No distress. HEENT: Oxygen on. No neck nodes palpable.  musculoskeletal: Diffuse multiple trigger points particularly on the back consistent with known fibromyalgia Respiratory exam: Overall entry is equal. Possible mild crackles in the bases. No wheeze no distress Cardia vascular: Normal heart sounds  regular rate and rhythm Neurologic: Alert and oriented 3. Speech normal. Glass, scale 15. Extremities: No cyanosis no clubbing no edema Skin exam: Appears intact in the exposed areas    Assessment:       ICD-9-CM ICD-10-CM   1. Chronic respiratory failure with hypoxia (HCC) 518.83 J96.11    799.02    2. Other emphysema ([place]) 492.8 J43.8        Plan:      Copd is stable  Plan  - continue oxygen, atrovent as before - any flare up or issue calls use  - flu shot in fall - ensure proper pain mgmt  followup  6 months or sooner if needed   Dr. Brand Males, M.D., [facility].C.P Pulmonary and Critical Care Medicine Staff Physician [place] [facility] Pager: [phone], If no answer or between  15:00h - 7:00h: call [phone]  05/19/2016 9:19 AM

## 2016-05-19 NOTE — Patient Instructions (Signed)
ICD-9-CM ICD-10-CM   1. Chronic respiratory failure with hypoxia (HCC) 518.83 J96.11    799.02    2. Other emphysema ([place]) 492.8 J43.8     Copd is stable  Plan  - continue oxygen, atrovent as before - any flare up or issue calls use  - flu shot in fall - ensure proper pain mgmt  followup  6 months or sooner if needed

## 2016-05-20 ENCOUNTER — Ambulatory Visit (INDEPENDENT_AMBULATORY_CARE_PROVIDER_SITE_OTHER): Payer: Medicare Other | Admitting: Neurology

## 2016-05-20 ENCOUNTER — Encounter: Payer: Self-pay | Admitting: Neurology

## 2016-05-20 VITALS — BP 127/84 | HR 68 | Ht 63.0 in | Wt 201.6 lb

## 2016-05-20 DIAGNOSIS — G44209 Tension-type headache, unspecified, not intractable: Secondary | ICD-10-CM | POA: Diagnosis not present

## 2016-05-20 MED ORDER — TIZANIDINE HCL 4 MG PO TABS: 1.0000 mg | ORAL_TABLET | Freq: Every day | ORAL | Status: DC

## 2016-05-20 NOTE — Patient Instructions (Signed)
I had a long discussion with the patient with regards to her daily headaches which sound like muscle tension headaches. I recommend she take Zanaflex 1 mg at night for a week or 2 and increase if tolerated to 2 mg. She'll continue gabapentin twice daily as well. I encouraged her to continue participated in activities for stress relaxation to help with the headaches as well as cognitively challenging activities like solving crossword puzzles, playing sudoku, which to help with her mild cognitive impairment which appears to be stable. Continue warfarin for stroke prevention for atrial fibrillation and strict control of hypertension with blood pressure goal below 130/90. No routine follow-up appointment is necessary with me and she may be referred back in the future as needed.

## 2016-05-20 NOTE — Progress Notes (Signed)
[facility] [address] [place]. [place] [address] [phone]       OFFICE FOLLOW UP VISIT NOTE  Laurie Horton Date of Birth:  1946/02/10 Medical Record Number:  [id number]   Referring MD:  Arlyss Queen  Reason for Referral:  Drooling and memory loss  HPI: Laurie Horton is a 70 year old Caucasian lady who has noted increased drooling from the right corner of the mouth for about 3 months. She also has noticed that she loses her train of thoughts and he has become forgetful to take her medications and forgets what she is doing. This has been going on for a year but is more noticeable recently. She denies any headache, focal extremity weakness, gait balance problems. 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 Recently she was found to have atrial fibrillation upon Holter monitoring and was initially started on Xarelto which she could not afford and now has been changed to warfarin which is tolerating without bleeding or bruising. She does have residual right-sided visual field loss and some weakness of the right lower face and intrinsic hand muscles on the right from her prior stroke. She admits her balance is poor but she is not had any major falls. She also has chronic fibromyalgia and chronic back pain for which he sees Dr. Estanislado Pandy. He denies any significant falls, head injury headache. She's not had any recent brain imaging study done. She denies any bradykinesia, change in handwriting or gait festination. She admits she recently had tooth removed 3 months ago but denies persistent pain or infection in her teeth. Update 12/02/2015 : She returns for follow-up after last visit 3 months ago. She states that she is now having drooling from both, from the mouth and this is particularly more in the  morning when she gets up. She did have a dental abscess which is been treated last year but symptoms persist. She denies excessive fatigue or tiredness but does agree to some intermittent blurred vision and double vision when she is tired. She states her memory difficulties continue but they're unchanged and are not progressive. She did undergo MRI scan the brain after last visit on 08/2115 which have personally reviewed and shows old left occipital as well as small left cerebellar infarcts as well as mild generalized atrophy. Carotid ultrasound done on 08/23/15 shows mild bilateral 50-69% ICA stenosis. Labs done on 08/16/15 show normal vitamin B12, TSH, RPR and homocysteine levels. EEG done on 10/01/2015 personally reviewed by me is normal. Update 05/20/2016 : She returns for follow-up after last visit 6 months ago. She states her memory difficulties about the same. She is had no further recurrent stroke or TIA symptoms. She remains on warfarin which is tolerating well with minor bruising but no bleeding episodes. She states she is going through withdrawal of narcotics as the rheumatologist abruptly stopped her oxycodone. She has fired her rheumatologist and is seeing Dr. Hardin Negus for pain management. He started on gabapentin 300 twice daily and some shots. She continues to participate in solving puzzles and [email or web address] to help with her short-term memory is poor but stable. She is complaining of constant headaches daily which are mild to moderate at the back of the head occasionally over the top. They're not incapacitating and seem like tension headaches. She does take Zanaflex occasionally for sleep  but not her regular basis to help with headaches. The patient had acetylcholine receptor antibodies checked on 11/3515 which were negative. She came in for the appointment for the nerve conduction study but when asked to pay $56 which she owed to the practice she got upset and left without doing the study. ROS:   14  system review of systems is positive for  fatigue, drooling, unexpected weight change, cough, shortness of breath, blurred vision, palpitations, swollen abdomen, vomiting, insomnia, snoring, daytime sleepiness, sleep talking, incontinence of bladder, back and neck pain, easy bruising and bleeding, dizziness, memory loss, speech difficulty, depression and hyperactivity. and all other systems negative  PMH:  Past Medical History  Diagnosis Date  . Depression   . GERD (gastroesophageal reflux disease)   . HTN (hypertension)   . Hiatal hernia   . COPD (chronic obstructive pulmonary disease) ([place])   . Diverticulitis   . DVT (deep venous thrombosis) ([place])   . Obesity   . Hyperlipidemia   . PFO (patent foramen ovale)   . PAT (paroxysmal atrial tachycardia) ([place])   . Right middle lobe pneumonia 07/24/2011    First noted at admit 07/10/11. Persists on cxr 07/22/11. Cleared on CT 08/24/11. No further followup  . PULMONARY NODULE, LEFT LOWER LOBE 10/14/2009    47mm LLL nodule dec 2010. Stable and 8mm in Oct 2012. No further fu  . TOBACCO ABUSE 06/04/2009  . Osteoporosis   . Fibromyalgia   . Stroke ([place])   . History of deviated nasal septum     left- side  . On supplemental oxygen therapy     concentrator at night @ 1.5 l/m or when sleeps. O2 Sat niormally 87.  . Complication of anesthesia     various issues with oxygen  saturations post op    Social History:  Social History   Social History  . Marital Status: Divorced    Spouse Name: N/A  . Number of Children: 2  . Years of Education: N/A   Occupational History  . disability    Social History Main Topics  . Smoking status: Former Smoker    Quit date: 06/02/2010  . Smokeless tobacco: Never Used  . Alcohol Use: No  . Drug Use: No  . Sexual Activity: Yes   Other Topics Concern  . Not on file   Social History Narrative    Medications:   Current Outpatient Prescriptions on File Prior to Visit  Medication Sig Dispense Refill  .  diazepam (VALIUM) 2 MG tablet Take 1 tablet (2 mg total) by mouth as needed for anxiety. 30 tablet 0  . gabapentin (NEURONTIN) 300 MG capsule Take 300 mg by mouth 2 (two) times daily.    Marland Kitchen ipratropium (ATROVENT) 0.02 % nebulizer solution Take 2.5 mLs (0.5 mg total) by nebulization 4 (four) times daily. (Patient taking differently: Take 0.5 mg by nebulization daily. ) 75 mL 6  . metoprolol succinate (TOPROL XL) 50 MG 24 hr tablet Take 1 tablet (50 mg total) by mouth daily. Take with or immediately following a meal. 30 tablet 11  . montelukast (SINGULAIR) 10 MG tablet TAKE 1 TABLET BY MOUTH AT BEDTIME 90 tablet 0  . NORVASC 5 MG tablet TAKE 1 TABLET BY MOUTH EVERY DAY 90 tablet 0  . ondansetron (ZOFRAN-ODT) 4 MG disintegrating tablet Take 1 tablet (4 mg total) by mouth every 8 (eight) hours as needed for nausea or vomiting. (Patient taking differently: Take 4 mg by mouth every 6 (six) hours as needed  for nausea or vomiting. ) 20 tablet 1  . oxybutynin (DITROPAN-XL) 10 MG 24 hr tablet Take 10 mg by mouth every morning. Reported on 02/06/2016  3  . OXYGEN Inhale 1.5 L into the lungs. For naps and sleep.    . pantoprazole (PROTONIX) 40 MG tablet Take 1 tablet by mouth every morning.     . polyethylene glycol (MIRALAX / GLYCOLAX) packet Take 17 g by mouth daily as needed for mild constipation.     Marland Kitchen PRISTIQ 50 MG 24 hr tablet Take 1 tablet (50 mg total) by mouth daily. 30 tablet 2  . traZODone (DESYREL) 100 MG tablet Take 1 tablet (100 mg total) by mouth at bedtime. 30 tablet 2  . warfarin (COUMADIN) 5 MG tablet Take 1 tablet by mouth daily or as directed by coumadin clinic 30 tablet 1   No current facility-administered medications on file prior to visit.    Allergies:   Allergies  Allergen Reactions  . Alprazolam     REACTION: stops breathing  . Pseudoephedrine Hcl Er Shortness Of Breath  . Budesonide-Formoterol Fumarate     bliusters  . Aciphex [Rabeprazole Sodium]     rash  . Avelox  [Moxifloxacin Hcl In Nacl]     Stomach cramps.   . Esomeprazole Magnesium     REACTION: "bouncing off walls"  . Flonase [Fluticasone Propionate]   . Iodine     REACTION: swelling in throat  . Loratadine     claritin D causes shaking  . Lotrimin [Clotrimazole] Other (See Comments)    blisters  . Lunesta [Eszopiclone]     REACTION: "slept for a week"  . Other     Glue from ekg/heart monitor leads --rash +  Any MYCINS  . Oxcarbazepine Other (See Comments)    Causes deep sleep and dizziness  . Zolpidem Tartrate     REACTION: "slept for a week"  . Bextra [Valdecoxib] Rash  . Ceclor [Cefaclor] Rash  . Covera-Hs [Verapamil Hcl] Palpitations  . Dicyclomine Hcl Rash  . Tessalon Perles Rash    Physical Exam General: well developed, well nourished elderly Caucasian lady, seated, in no evident distress Head: head normocephalic and atraumatic.   Neck: supple with no carotid or supraclavicular bruits Cardiovascular: regular rate and rhythm, no murmurs Musculoskeletal: no deformity Skin:  no rash/petichiae Vascular:  Normal pulses all extremities  Neurologic Exam Mental Status: Awake and fully alert. Oriented to place and time. Recent and remote memory intact. Attention span, concentration and fund of knowledge appropriate. Mood and affect appropriate. Mini-Mental status exam not done Recall 2/3. Marland Kitchen Able to name 67four legged  animals. Clock drawing 4/4. Cranial Nerves: Fundoscopic exam not done  . Pupils equal, briskly reactive to light. Extraocular movements full without nystagmus. Visual fields show partial right homonymous hemianopsia to confrontation. Hearing intact. Mild right lower facial asymmetry and weakness Facial sensation intact. Face, tongue, palate moves normally and symmetrically.  Motor: Normal bulk and tone. Normal strength in all tested extremity muscles. Mild weakness of right grip and intrinsic hand muscles. Diminished fine finger movements on the right. Orbits left over  right approximately. Sensory.: intact to touch , pinprick , position and vibratory sensation.  Coordination: Rapid alternating movements normal in all extremities. Finger-to-nose and heel-to-shin performed accurately bilaterally. Gait and Station: Arises from chair without difficulty. Stance is normal. Gait demonstrates normal stride length and balance . Able to heel, toe and tandem walk without difficulty.  Reflexes: 1+ and symmetric. Toes downgoing.  ASSESSMENT: 41 year Caucasian lady with recent complaints of drooling from bilateral corner of the mouth of unclear etiology. She does have residual right lower facial weakness from a previous stroke 10 years ago and had recent dental surgery done which may perhaps contribute She does not have any associated features to suggest Parkinson's disease. She also has complaints of memory loss and mild cognitive impairment which may be is age appropriate but needs further evaluation as well. Remote history of embolic left parieto-occipital infarct in 2006 with unknown  source but h/o small patent foramen ovale. H/o  atrial fibrillation    PLAN: I had a long discussion with the patient with regards to her daily headaches which sound like muscle tension headaches. I recommend she take Zanaflex 1 mg at night for a week or 2 and increase if tolerated to 2 mg. She'll continue gabapentin twice daily as well. I encouraged her to continue participated in activities for stress relaxation to help with the headaches as well as cognitively challenging activities like solving crossword puzzles, playing sudoku, which to help with her mild cognitive impairment which appears to be stable. Continue warfarin for stroke prevention for atrial fibrillation and strict control of hypertension with blood pressure goal below 130/90. No routine follow-up appointment is necessary with me and she may be referred back in the future as needed.  Antony Contras, MD  Note: This  document was prepared with digital dictation and possible smart phrase technology. Any transcriptional errors that result from this process are unintentional.

## 2016-05-25 ENCOUNTER — Telehealth: Payer: Self-pay | Admitting: Pharmacist

## 2016-05-25 ENCOUNTER — Ambulatory Visit (INDEPENDENT_AMBULATORY_CARE_PROVIDER_SITE_OTHER): Payer: Medicare Other | Admitting: Pharmacist

## 2016-05-25 DIAGNOSIS — Z7901 Long term (current) use of anticoagulants: Secondary | ICD-10-CM | POA: Diagnosis not present

## 2016-05-25 DIAGNOSIS — I480 Paroxysmal atrial fibrillation: Secondary | ICD-10-CM

## 2016-05-25 LAB — POCT INR: INR: 2.4

## 2016-05-25 NOTE — Telephone Encounter (Signed)
Patient presented today for anticoag visit. She reports periods of low blood pressure - down to 74/59 and 84/66 over the last several days. She report this has been occurring since a short period in afib about 3 days ago.  She states she has been extremely dizzy and felt poorly. Her pressures remain in the 100s-110s or lower. Will have her discontinue amlodipine for the time being and have instructed her to call if symptomatic low blood pressures persist or pressures trend above 140/90 after discontinuing medication.

## 2016-05-29 ENCOUNTER — Telehealth: Payer: Self-pay | Admitting: Pharmacist Clinician (PhC)/ Clinical Pharmacy Specialist

## 2016-05-29 NOTE — Telephone Encounter (Signed)
Pt was in office Tuesday for INR, noted her BP to be low with symptomatic dizziness, weakness.  She was told to hold amlodipine and call back in 3-5 days to report he BP readings.  Calls today, states this am was better at 112/89, but have still been < 123XX123 systolic most of last 3 days.  She takes no other BP medications, just metoprolol succ 50 mg qd for PAF.    Advised her that today's reading looks much better, she should continue without amlodipine and let us know next week if the systolic readings continue to increase to > 100.  Patient voiced understanding.

## 2016-06-01 ENCOUNTER — Telehealth: Payer: Self-pay | Admitting: Pharmacist

## 2016-06-01 NOTE — Telephone Encounter (Signed)
Patient calling to report blood pressure readings over the last week.   She reports readings ranging from 101-121/53-90 with majority in 110s/80s. She feels terrible with no energy and states she can't bend over.   Her only current blood pressure medication is Toprol XL 50mg .   She is on Zanaflex which is very commonly associated with hypotension and drowsiness. She states her neighbor told her not to take this medication so she has only taken 1 dose recently.

## 2016-06-03 ENCOUNTER — Ambulatory Visit (HOSPITAL_COMMUNITY): Payer: Self-pay | Admitting: Psychiatry

## 2016-06-03 ENCOUNTER — Telehealth: Payer: Self-pay | Admitting: Interventional Cardiology

## 2016-06-03 NOTE — Telephone Encounter (Signed)
New message     Request for surgical clearance:  1. What type of surgery is being performed? Extraction at Dentist office  2. When is this surgery scheduled? 06/11/16  3. Are there any medications that need to be held prior to surgery and how long? Wants to know if Blood thinner needs to be held?  4. Name of physician performing surgery?Dr. Annitta Jersey, DDS  5. What is your office phone and fax number? [phone], [phone] [email or web address]

## 2016-06-04 NOTE — Telephone Encounter (Signed)
This appears to be a patient followed by Dr. Martinique.

## 2016-06-05 NOTE — Telephone Encounter (Signed)
If it is just a simple extraction I would not hold coumadin. If she is going to have extensive dental work we may need to hold.  Peter Martinique MD, [facility]

## 2016-06-08 ENCOUNTER — Telehealth: Payer: Self-pay | Admitting: Cardiology

## 2016-06-08 MED ORDER — METOPROLOL SUCCINATE ER 25 MG PO TB24: 25.0000 mg | ORAL_TABLET | Freq: Every day | ORAL | 6 refills | Status: DC

## 2016-06-08 NOTE — Addendum Note (Signed)
Addended by: Kathyrn Lass on: 06/08/2016 10:32 AM   Modules accepted: Orders

## 2016-06-08 NOTE — Telephone Encounter (Signed)
Returned call to patient. Patient has been keeping log of BP's as follows:   Today, 8/7-   at 0323  BP 78/42  HR 52 (lying)           at 0529 89/65  HR 73  Later this morning 103/73, HR 73 and 99/76, HR 76.  Sunday 8/6-   0510   84/59  , HR 59 (doing things around house)   During the morning, BP 104/73, HR 60; 111/83, HR 65   During the afternoon,  BP 89/65, HR 89 and 112/74, 67 Saturday, 8/5-  Early am readings were 60/37, HR 76-lying before getting  out of bed    At 0805  65/49, HR 136  In the pm  116/84, HR 56.  Patient is experiencing frequent times of dizziness and nausea associated with the lower reading, especially when SBP<100.  She was in grocery store yesterday and felt dizzy and nauseas and had to sit down.  She was advised by our pharmacist to stop her norvasc about 2 weeks ago, which she did and has not taken it since then. She still take Metoprolol XL 50 mg daily.  She is due for her 6 month ROV f/u with Dr Martinique this month and needs to schedule an appt.  Will route to Dr Martinique for advice.

## 2016-06-08 NOTE — Telephone Encounter (Signed)
New message    Pt c/o BP issue: STAT if pt c/o blurred vision, one-sided weakness or slurred speech  1. What are your last 5 BP readings?  3:52 am on yesterday - 60/37 pulse  76 Retake 8:05 - 65/49 - pulse 136 Retake in pm  116/84 pulse 56.   Yesterday 89/65  Pulse 81 Again 95/77 pulse 60 Again 84/59 pulse 59  Today 95/71 pulse 67 Again  103/73 pulse 72    2. Are you having any other symptoms (ex. Dizziness, headache, blurred vision, passed out)? Dizziness , can not bend over   3. What is your BP issue?  Wants to discuss with nurse

## 2016-06-08 NOTE — Telephone Encounter (Signed)
Returned call to patient.Dr.Jordan's advice given.Follow up appointment scheduled with Cecilie Kicks NP 06/25/16 at 8:30 am at [facility] office.Advised to call sooner if needed.

## 2016-06-08 NOTE — Telephone Encounter (Signed)
I would reduce her Toprol XL to 25 mg daily. Stay well hydrated. She can liberalize her salt intake.   Deneshia Zucker Martinique MD, [facility]

## 2016-06-09 NOTE — Telephone Encounter (Signed)
Follow up       Calling to check on the status on clearance for extractions.  Pt is due to be seen this week.  Please fax clearance to [phone] as soon as possible please

## 2016-06-09 NOTE — Telephone Encounter (Signed)
Laurie Horton returning call-telephone note with clearance from MD Martinique faxed to [phone] (verified with Laurie Horton) to [name].

## 2016-06-09 NOTE — Telephone Encounter (Signed)
LMTCB for Laurie Horton at [facility].

## 2016-06-11 ENCOUNTER — Other Ambulatory Visit (HOSPITAL_COMMUNITY): Payer: Self-pay | Admitting: Psychiatry

## 2016-06-11 DIAGNOSIS — F0630 Mood disorder due to known physiological condition, unspecified: Secondary | ICD-10-CM

## 2016-06-15 ENCOUNTER — Ambulatory Visit (INDEPENDENT_AMBULATORY_CARE_PROVIDER_SITE_OTHER): Payer: Medicare Other | Admitting: Pharmacist

## 2016-06-15 DIAGNOSIS — Z7901 Long term (current) use of anticoagulants: Secondary | ICD-10-CM

## 2016-06-15 DIAGNOSIS — I480 Paroxysmal atrial fibrillation: Secondary | ICD-10-CM | POA: Diagnosis not present

## 2016-06-15 LAB — POCT INR: INR: 2.9

## 2016-06-19 ENCOUNTER — Other Ambulatory Visit (HOSPITAL_COMMUNITY): Payer: Self-pay | Admitting: Psychiatry

## 2016-06-19 DIAGNOSIS — F0630 Mood disorder due to known physiological condition, unspecified: Secondary | ICD-10-CM

## 2016-06-24 NOTE — Progress Notes (Signed)
Cardiology Office Note   Date:  06/25/2016   ID:  Laurie Horton, Laurie Horton [birth year], MRN [id number]  PCP:  Laurie Forts, MD  Cardiologist:  Dr. Martinique    Chief Complaint  Patient presents with  . Hypotension      History of Present Illness: Laurie Horton is a 70 y.o. female who presents for hypotension.  DR. Martinique DECREASED HER TOPROL TO 25 MG DAILY O ON 06/08/16--BPs were as low as 93/59  Hr TO 56-136  She has a history of  abnormal ECG, severe COPD, stroke after knee surgery in 2006. Evaluation at that time revealed a PFO by TEE. She was previously maintained on aspirin therapy. In December 2015 she was noted to have more paroxysmal Afib. She was anticoagulated and Toprol dose was increased to 25 mg. She wore an event monitor that showed infrequent runs of Afib.   In December she developed more facial droop and was concerned that she had another stroke. She was seen by Dr. Leonie Horton and MRI done showing old CVA but no acute findings. He felt her symptoms were related to old CVA and some recent dental procedures. Carotid dopplers showed 50-69% stenosis bilaterally.  Today she is here for follow up of her hypotension.  BP is up and down. She has a list of BP checks.  Currently what works best for her is check her BP in AM if less than 123456 systolic then hold BB until BP 123456 systolic or greater, usually by lunch and then to take BB.  This seems to work the best.  She is off amlodipine.  No chest pain No SOB.  She complains of fluttering but here in the office with these symptoms her HR was regular.  She also complains of dizziness, no orthostasis.  She does have a hx of Carotid disease and is time for follow up.  Very tender across chest to touch due to her chronic fatigue.    Past Medical History:  Diagnosis Date  . Complication of anesthesia    various issues with oxygen  saturations post op  . COPD (chronic obstructive pulmonary disease) ([place])   . Depression   . Diverticulitis   .  DVT (deep venous thrombosis) ([place])   . Fibromyalgia   . GERD (gastroesophageal reflux disease)   . Hiatal hernia   . History of deviated nasal septum    left- side  . HTN (hypertension)   . Hyperlipidemia   . Obesity   . On supplemental oxygen therapy    concentrator at night @ 1.5 l/m or when sleeps. O2 Sat niormally 87.  . Osteoporosis   . PAT (paroxysmal atrial tachycardia) ([place])   . PFO (patent foramen ovale)   . PULMONARY NODULE, LEFT LOWER LOBE 10/14/2009   64mm LLL nodule dec 2010. Stable and 69mm in Oct 2012. No further fu  . Right middle lobe pneumonia 07/24/2011   First noted at admit 07/10/11. Persists on cxr 07/22/11. Cleared on CT 08/24/11. No further followup  . Stroke ([place])   . TOBACCO ABUSE 06/04/2009    Past Surgical History:  Procedure Laterality Date  . CARDIOVASCULAR STRESS TEST  12/26/2004   EF 74%. NO EVIDENCE OF ISCHEMIA  . ESOPHAGOGASTRODUODENOSCOPY (EGD) WITH PROPOFOL N/A 04/15/2015   Procedure: ESOPHAGOGASTRODUODENOSCOPY (EGD) WITH PROPOFOL;  Surgeon: Laurie Spates, MD;  Location: [facility];  Service: Endoscopy;  Laterality: N/A;  . KNEE ARTHROSCOPY  2000   left  . LAPAROSCOPIC CHOLECYSTECTOMY  04-16-2010   [facility]  .  MOUTH SURGERY     03-26-15 multiple extractions stitches remains  . TOTAL ABDOMINAL HYSTERECTOMY     post op needed oxygen was told "she gave them a scare"  . TUBAL LIGATION    . US ECHOCARDIOGRAPHY  11/20/2009   EF 55-60%     Current Outpatient Prescriptions  Medication Sig Dispense Refill  . diazepam (VALIUM) 2 MG tablet Take 1 tablet (2 mg total) by mouth as needed for anxiety. 30 tablet 0  . gabapentin (NEURONTIN) 300 MG capsule Take 300 mg by mouth 2 (two) times daily.    Marland Kitchen ipratropium (ATROVENT) 0.02 % nebulizer solution Take 0.25 mg by nebulization as directed.     . metoprolol succinate (TOPROL XL) 25 MG 24 hr tablet Take 1 tablet (25 mg total) by mouth daily. 30 tablet 6  . montelukast (SINGULAIR) 10 MG tablet TAKE 1 TABLET BY  MOUTH AT BEDTIME 90 tablet 0  . oxybutynin (DITROPAN-XL) 10 MG 24 hr tablet Take 10 mg by mouth every morning. Reported on 02/06/2016  3  . OXYGEN Inhale 1.5 L into the lungs. For naps and sleep.    . pantoprazole (PROTONIX) 40 MG tablet Take 1 tablet by mouth every morning.     . polyethylene glycol (MIRALAX / GLYCOLAX) packet Take 17 g by mouth daily as needed for mild constipation.     Marland Kitchen PRISTIQ 50 MG 24 hr tablet TAKE 1 TABLET BY MOUTH EVERY DAY 30 tablet 0  . traZODone (DESYREL) 100 MG tablet TAKE 1 TABLET (100 MG TOTAL) BY MOUTH AT BEDTIME. 30 tablet 1  . warfarin (COUMADIN) 5 MG tablet Take 1 tablet by mouth daily or as directed by coumadin clinic 30 tablet 1   No current facility-administered medications for this visit.     Allergies:   Alprazolam; Pseudoephedrine hcl er; Budesonide-formoterol fumarate; Aciphex [rabeprazole sodium]; Avelox [moxifloxacin hcl in nacl]; Esomeprazole magnesium; Flonase [fluticasone propionate]; Iodine; Loratadine; Lotrimin [clotrimazole]; Lunesta [eszopiclone]; Other; Oxcarbazepine; Zolpidem tartrate; Bextra [valdecoxib]; Ceclor [cefaclor]; Covera-hs [verapamil hcl]; Dicyclomine hcl; and Tessalon perles    Social History:  The patient  reports that she quit smoking about 6 years ago. She has never used smokeless tobacco. She reports that she does not drink alcohol or use drugs.   Family History:  The patient's family history includes Alzheimer's disease in her mother; Dementia in her mother; Diabetes in her mother and sister; Heart attack in her father; Heart attack (age of onset: 40) in her brother; Schizophrenia in her sister; Tremor in her sister.    ROS:  General:no colds or fevers, no weight changes Skin:no rashes or ulcers HEENT:no blurred vision, no congestion CV:see HPI PUL:see HPI  +COPD GI:no diarrhea constipation or melena, no indigestion GU:no hematuria, no dysuria MS:no joint pain, no claudication, + muscular pain Neuro:no syncope, no  lightheadedness Endo:no diabetes, no thyroid disease  Wt Readings from Last 3 Encounters:  06/25/16 200 lb (90.7 kg)  05/20/16 201 lb 9.6 oz (91.4 kg)  05/19/16 201 lb (91.2 kg)     PHYSICAL EXAM: VS:  BP (!) 140/94   Pulse 71   Ht 5\' 4"  (1.626 m)   Wt 200 lb (90.7 kg)   SpO2 91%   BMI 34.33 kg/m  , BMI Body mass index is 34.33 kg/m. General:Pleasant affect, NAD Skin:Warm and dry, brisk capillary refill HEENT:normocephalic, sclera clear, mucus membranes moist Neck:supple, no JVD, no bruits  Heart:S1S2 RRR without murmur, gallup, rub or click Lungs:clear without rales, rhonchi, or wheezes [id number], non tender, +  BS, do not palpate liver spleen or masses Ext:no lower ext edema, 2+ pedal pulses, 2+ radial pulses Neuro:alert and oriented, MAE, follows commands, + facial symmetry    EKG:  EKG is NOT ordered today.    Recent Labs: 07/11/2015: ALT 14; BUN 14; Creat 0.93; Hemoglobin 13.6; Platelets 196; Potassium 4.0; Sodium 137 08/16/2015: TSH 1.960    Lipid Panel    Component Value Date/Time   CHOL 237 (H) 07/11/2015 0828   TRIG 182 (H) 07/11/2015 0828   HDL 39 (L) 07/11/2015 0828   CHOLHDL 6.1 (H) 07/11/2015 0828   VLDL 36 (H) 07/11/2015 0828   LDLCALC 162 (H) 07/11/2015 [id number]       Other studies Reviewed: Additional studies/ records that were reviewed today include: .  Echo: 2015 Study Conclusions  - Left ventricle: The cavity size was normal. There was mild concentric hypertrophy. Systolic function was normal. The estimated ejection fraction was in the range of 60% to 65%. Wall motion was normal; there were no regional wall motion abnormalities. Doppler parameters are consistent with abnormal left ventricular relaxation (grade 1 diastolic dysfunction). - Aorta: Aortic root dimension: 44 mm (ED). - Ascending aorta: The ascending aorta was mildly dilated. - Left atrium: The atrium was mildly dilated.   Carotid dopplers: RIGHT CAROTID ARTERY: No  significant calcifications of the right common carotid artery. Intermediate waveform maintained. Heterogeneous and partially calcified plaque at the right carotid bifurcation. Shadow present across the lumen. Low resistance waveform of the right ICA. Tortuosity of the right carotid system.  RIGHT VERTEBRAL ARTERY: Antegrade flow with low resistance waveform.  LEFT CAROTID ARTERY: No significant calcifications of the left common carotid artery. Intermediate waveform maintained. Heterogeneous and partially calcified plaque at the left carotid bifurcation. Shadow present across the lumen. Low resistance waveform of the left ICA. No significant tortuosity.  LEFT VERTEBRAL ARTERY:  Antegrade flow with low resistance waveform.  IMPRESSION: Right:  Heterogeneous and partially calcified plaque of the right carotid bifurcation contributing to 50% - 69% stenosis by duplex criteria. Note that the flow velocities of the right ICA were obtained from an area distal to the maximum narrowing due to the presence of anterior wall plaque with shadowing and may be underestimating the percentage of ICA stenosis. If further evaluation is warranted, consider formal cerebral angiogram or alternatively CTA.  Left:  Heterogeneous and partially calcified plaque of the left carotid bifurcation. There are discordant results regarding the degree of stenosis, with the maximum velocity measuring in the 50%- 69% range, and the ratio suggesting a lesser degree of stenosis.  Flow velocities of the left ICA were obtained from an area distal to the maximum narrowing due to the presence of anterior wall plaque with shadowing and may be underestimating the percentage of ICA stenosis. If further, more specific degree of stenosis needs to be measured, consider formal cerebral angiogram, or alternatively CTA.   ASSESSMENT AND PLAN:  1. Hypotension now improved, BP elevated today but has been controlled.    2. PAF -Continue toprol for rate control.  She does complain of "flutters" but when I listen today RRR.   3.. PFO - on anticoagulation. Stay off ASA since on coumadin.  4. COPD   5. Hypertension -  Elevated today but she is having considerable pain today. Will monitor with increased Toprol dose.   6. Aortic root enlargement. 44 mm by Echo.   7. Abnormal Ecg with chronic T wave changes.  8. Diffuse arthragias. Seeing rheumatology today.  Follow up in  2 months with Dr. Martinique.  Current medicines are reviewed with the patient today.  The patient Has no concerns regarding medicines.  The following changes have been made:  See above Labs/ tests ordered today include:see above  Disposition:   FU:  see above  Signed, Cecilie Kicks, NP  06/25/2016 9:37 PM    [facility] [place], [place], [address] [place], [place] Phone: [phone]; Fax: [phone]

## 2016-06-25 ENCOUNTER — Ambulatory Visit (INDEPENDENT_AMBULATORY_CARE_PROVIDER_SITE_OTHER): Payer: Medicare Other | Admitting: Cardiology

## 2016-06-25 ENCOUNTER — Encounter (INDEPENDENT_AMBULATORY_CARE_PROVIDER_SITE_OTHER): Payer: Self-pay

## 2016-06-25 ENCOUNTER — Encounter: Payer: Self-pay | Admitting: Cardiology

## 2016-06-25 VITALS — BP 140/94 | HR 71 | Ht 64.0 in | Wt 200.0 lb

## 2016-06-25 DIAGNOSIS — I952 Hypotension due to drugs: Secondary | ICD-10-CM

## 2016-06-25 DIAGNOSIS — Z79899 Other long term (current) drug therapy: Secondary | ICD-10-CM

## 2016-06-25 DIAGNOSIS — I6523 Occlusion and stenosis of bilateral carotid arteries: Secondary | ICD-10-CM | POA: Diagnosis not present

## 2016-06-25 DIAGNOSIS — I10 Essential (primary) hypertension: Secondary | ICD-10-CM

## 2016-06-25 DIAGNOSIS — I480 Paroxysmal atrial fibrillation: Secondary | ICD-10-CM | POA: Diagnosis not present

## 2016-06-25 DIAGNOSIS — J449 Chronic obstructive pulmonary disease, unspecified: Secondary | ICD-10-CM

## 2016-06-25 DIAGNOSIS — Z7901 Long term (current) use of anticoagulants: Secondary | ICD-10-CM

## 2016-06-25 NOTE — Patient Instructions (Signed)
Your physician recommends that you return for lab work fasting. This will be drawn by [facility].  Your physician has requested that you have a carotid duplex. This test is an ultrasound of the carotid arteries in your neck. It looks at blood flow through these arteries that supply the brain with blood. Allow one hour for this exam. There are no restrictions or special instructions. This will be done at the [facility].  Your physician recommends that you schedule a follow-up appointment  FIRST AVAILABLE with Dr Martinique.

## 2016-06-30 ENCOUNTER — Telehealth: Payer: Self-pay | Admitting: Cardiology

## 2016-06-30 NOTE — Telephone Encounter (Signed)
Returned call to patient.Patient was given all her appointment dates and times.She voiced understanding and knows to go to [facility] location.

## 2016-06-30 NOTE — Telephone Encounter (Signed)
New Message  Pt voiced her paperwork is unclear on what type of test she is taking and where she is going.  Pt voiced per her paperwork states for blood work she needs to come to [place].  Pt voiced she would like for nurse to contact her about her paperwork.  Please follow up with pt. Thanks!

## 2016-07-01 ENCOUNTER — Ambulatory Visit: Payer: Self-pay | Admitting: Physician Assistant

## 2016-07-02 ENCOUNTER — Ambulatory Visit (HOSPITAL_COMMUNITY)
Admission: RE | Admit: 2016-07-02 | Discharge: 2016-07-02 | Disposition: A | Discharge status: Home / Self Care | Payer: Medicare Other | Source: Ambulatory Visit | Attending: Cardiology | Admitting: Cardiology

## 2016-07-02 DIAGNOSIS — Z720 Tobacco use: Secondary | ICD-10-CM | POA: Insufficient documentation

## 2016-07-02 DIAGNOSIS — I10 Essential (primary) hypertension: Secondary | ICD-10-CM | POA: Insufficient documentation

## 2016-07-02 DIAGNOSIS — R51 Headache: Secondary | ICD-10-CM | POA: Insufficient documentation

## 2016-07-02 DIAGNOSIS — J449 Chronic obstructive pulmonary disease, unspecified: Secondary | ICD-10-CM | POA: Diagnosis not present

## 2016-07-02 DIAGNOSIS — I2510 Atherosclerotic heart disease of native coronary artery without angina pectoris: Secondary | ICD-10-CM | POA: Diagnosis not present

## 2016-07-02 DIAGNOSIS — I6523 Occlusion and stenosis of bilateral carotid arteries: Secondary | ICD-10-CM | POA: Diagnosis present

## 2016-07-02 DIAGNOSIS — Z8673 Personal history of transient ischemic attack (TIA), and cerebral infarction without residual deficits: Secondary | ICD-10-CM | POA: Insufficient documentation

## 2016-07-03 ENCOUNTER — Telehealth: Payer: Self-pay | Admitting: *Deleted

## 2016-07-03 DIAGNOSIS — E785 Hyperlipidemia, unspecified: Secondary | ICD-10-CM

## 2016-07-03 LAB — CBC
HCT: 43.5 % (ref 35.0–45.0)
HEMOGLOBIN: 14.9 g/dL (ref 11.7–15.5)
MCH: 32.3 pg (ref 27.0–33.0)
MCHC: 34.3 g/dL (ref 32.0–36.0)
MCV: 94.4 fL (ref 80.0–100.0)
MPV: 10.6 fL (ref 7.5–12.5)
PLATELETS: 253 10*3/uL (ref 140–400)
RBC: 4.61 MIL/uL (ref 3.80–5.10)
RDW: 13.7 % (ref 11.0–15.0)
WBC: 6.5 10*3/uL (ref 3.8–10.8)

## 2016-07-03 LAB — COMPREHENSIVE METABOLIC PANEL
ALBUMIN: 4.4 g/dL (ref 3.6–5.1)
ALT: 16 U/L (ref 6–29)
AST: 13 U/L (ref 10–35)
Alkaline Phosphatase: 55 U/L (ref 33–130)
BUN: 15 mg/dL (ref 7–25)
CHLORIDE: 100 mmol/L (ref 98–110)
CO2: 26 mmol/L (ref 20–31)
CREATININE: 0.98 mg/dL — AB (ref 0.60–0.93)
Calcium: 9.7 mg/dL (ref 8.6–10.4)
Glucose, Bld: 114 mg/dL — ABNORMAL HIGH (ref 65–99)
Potassium: 4.3 mmol/L (ref 3.5–5.3)
Sodium: 139 mmol/L (ref 135–146)
Total Bilirubin: 0.7 mg/dL (ref 0.2–1.2)
Total Protein: 6.4 g/dL (ref 6.1–8.1)

## 2016-07-03 LAB — LIPID PANEL
Cholesterol: 230 mg/dL — ABNORMAL HIGH (ref 125–200)
HDL: 50 mg/dL (ref >=46)
LDL CALC: 155 mg/dL — AB (ref <130)
TRIGLYCERIDES: 127 mg/dL (ref <150)
Total CHOL/HDL Ratio: 4.6 Ratio (ref <=5.0)
VLDL: 25 mg/dL (ref <30)

## 2016-07-03 MED ORDER — ROSUVASTATIN CALCIUM 5 MG PO TABS
5.0000 mg | ORAL_TABLET | Freq: Every day | ORAL | 3 refills | Status: DC
Start: 2016-07-03 — End: 2016-08-03

## 2016-07-03 NOTE — Telephone Encounter (Signed)
-----   Message from Isaiah Serge, NP sent at 07/03/2016  2:28 PM EDT ----- Her cholesterol is elevated LDL bad chol is 155.  Would do low dose of statin if she could tolerate.  Crestor 5 mg daily.  And recheck hepatic and lipid in 8 weeks.  Her good cholesterol is very good.  Other labs stable and normal.  Thanks.

## 2016-07-14 ENCOUNTER — Ambulatory Visit (INDEPENDENT_AMBULATORY_CARE_PROVIDER_SITE_OTHER): Payer: Medicare Other | Admitting: Pharmacist

## 2016-07-14 DIAGNOSIS — Z7901 Long term (current) use of anticoagulants: Secondary | ICD-10-CM

## 2016-07-14 DIAGNOSIS — I480 Paroxysmal atrial fibrillation: Secondary | ICD-10-CM | POA: Diagnosis not present

## 2016-07-14 LAB — POCT INR: INR: 3.7

## 2016-07-20 ENCOUNTER — Other Ambulatory Visit (HOSPITAL_COMMUNITY): Payer: Self-pay | Admitting: Psychiatry

## 2016-07-20 ENCOUNTER — Other Ambulatory Visit: Payer: Self-pay | Admitting: Emergency Medicine

## 2016-07-20 DIAGNOSIS — F0630 Mood disorder due to known physiological condition, unspecified: Secondary | ICD-10-CM

## 2016-07-27 ENCOUNTER — Ambulatory Visit (HOSPITAL_COMMUNITY): Payer: Self-pay | Admitting: Psychiatry

## 2016-07-31 ENCOUNTER — Ambulatory Visit (INDEPENDENT_AMBULATORY_CARE_PROVIDER_SITE_OTHER): Payer: Medicare Other | Admitting: Physician Assistant

## 2016-07-31 ENCOUNTER — Encounter: Payer: Self-pay | Admitting: Physician Assistant

## 2016-07-31 VITALS — BP 118/82 | HR 110 | Temp 98.9°F | Resp 16 | Wt 199.4 lb

## 2016-07-31 DIAGNOSIS — R300 Dysuria: Secondary | ICD-10-CM

## 2016-07-31 DIAGNOSIS — R21 Rash and other nonspecific skin eruption: Secondary | ICD-10-CM | POA: Diagnosis not present

## 2016-07-31 LAB — POCT URINALYSIS DIP (MANUAL ENTRY)
BILIRUBIN UA: NEGATIVE
GLUCOSE UA: NEGATIVE
Ketones, POC UA: NEGATIVE
NITRITE UA: NEGATIVE
PH UA: 6.5
Protein Ur, POC: NEGATIVE
Spec Grav, UA: 1.015
UROBILINOGEN UA: 1

## 2016-07-31 LAB — POC MICROSCOPIC URINALYSIS (UMFC): MUCUS RE: ABSENT

## 2016-07-31 LAB — POCT CBC
Granulocyte percent: 85 %G — AB (ref 37–80)
HEMATOCRIT: 41 % (ref 37.7–47.9)
HEMOGLOBIN: 14.5 g/dL (ref 12.2–16.2)
LYMPH, POC: 1.9 (ref 0.6–3.4)
MCH, POC: 32.5 pg — AB (ref 27–31.2)
MCHC: 35.5 g/dL — AB (ref 31.8–35.4)
MCV: 91.5 fL (ref 80–97)
MID (CBC): 0.3 (ref 0–0.9)
MPV: 8.3 fL (ref 0–99.8)
POC GRANULOCYTE: 12.2 — AB (ref 2–6.9)
POC LYMPH PERCENT: 12.9 %L (ref 10–50)
POC MID %: 2.1 % (ref 0–12)
Platelet Count, POC: 224 10*3/uL (ref 142–424)
RBC: 4.47 M/uL (ref 4.04–5.48)
RDW, POC: 13.2 %
WBC: 14.4 10*3/uL — AB (ref 4.6–10.2)

## 2016-07-31 LAB — POCT WET + KOH PREP
TRICH BY WET PREP: ABSENT
YEAST BY WET PREP: ABSENT
Yeast by KOH: ABSENT

## 2016-07-31 MED ORDER — VALACYCLOVIR HCL 1 G PO TABS: 1000.0000 mg | ORAL_TABLET | Freq: Two times a day (BID) | ORAL | 0 refills | Status: DC

## 2016-07-31 MED ORDER — AMOXICILLIN-POT CLAVULANATE 875-125 MG PO TABS: 1.0000 | ORAL_TABLET | Freq: Two times a day (BID) | ORAL | 0 refills | Status: DC

## 2016-07-31 MED ORDER — ZINC OXIDE 12 % EX CREA: 1.0000 "application " | TOPICAL_CREAM | Freq: Two times a day (BID) | CUTANEOUS | 1 refills | Status: DC
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
## 2016-07-31 NOTE — Patient Instructions (Addendum)
Please take the valacyclovir as prescribed.  I will follow up with you on the urine culture, and the viral culture.  You can apply zinc oxide to this area.    I am going to ask you to apply a nasal rinse to the nostrils daily.  You may also want to use an air humidifier.  Please take the antibiotic as prescribed.  If this continues, I would like you to return in 24-48 hours.   Nosebleed Nosebleeds are common. A nosebleed can be caused by many things, including:  Getting hit hard in the nose.  Infections.  Dryness in your nose.  A dry climate.  Medicines.  Picking your nose.  Your home heating and cooling systems. HOME CARE   Try controlling your nosebleed by pinching your nostrils gently. Do this for at least 10 minutes.  Avoid blowing or sniffing your nose for a number of hours after having a nosebleed.  Do not put gauze inside of your nose yourself. If your nose was packed by your doctor, try to keep the pack inside of your nose until your doctor removes it.  If a gauze pack was used and it starts to fall out, gently replace it or cut off the end of it.  If a balloon catheter was used to pack your nose, do not cut or remove it unless told by your doctor.  Avoid lying down while you are having a nosebleed. Sit up and lean forward.  Use a nasal spray decongestant to help with a nosebleed as told by your doctor.  Do not use petroleum jelly or mineral oil in your nose. These can drip into your lungs.  Keep your house humid by using:  Less air conditioning.  A humidifier.  Aspirin and blood thinners make bleeding more likely. If you are prescribed these medicines and you have nosebleeds, ask your doctor if you should stop taking the medicines or adjust the dose. Do not stop medicines unless told by your doctor.  Resume your normal activities as you are able. Avoid straining, lifting, or bending at your waist for several days.  If your nosebleed was caused by dryness in  your nose, use over-the-counter saline nasal spray or gel. If you must use a lubricant:  Choose one that is water-soluble.  Use it only as needed.  Do not use it within several hours of lying down.  Keep all follow-up visits as told by your doctor. This is important. GET HELP IF:  You have a fever.  You get frequent nosebleeds.  You are getting nosebleeds more often. GET HELP RIGHT AWAY IF:  Your nosebleed lasts longer than 20 minutes.  Your nosebleed occurs after an injury to your face, and your nose looks crooked or broken.  You have unusual bleeding from other parts of your body.  You have unusual bruising on other parts of your body.  You feel light-headed or dizzy.  You become sweaty.  You throw up (vomit) blood.  You have a nosebleed after a head injury.   This information is not intended to replace advice given to you by your health care provider. Make sure you discuss any questions you have with your health care provider.   Document Released: 07/28/2008 Document Revised: 11/09/2014 Document Reviewed: 06/04/2014 Elsevier Interactive Patient Education [employer].

## 2016-08-01 LAB — URINE CULTURE

## 2016-08-03 ENCOUNTER — Encounter (HOSPITAL_COMMUNITY): Payer: Self-pay | Admitting: Emergency Medicine

## 2016-08-03 ENCOUNTER — Observation Stay (HOSPITAL_COMMUNITY)
Admission: EM | Admit: 2016-08-03 | Discharge: 2016-08-06 | Disposition: A | Discharge status: Home Health | Payer: Medicare Other | Attending: Internal Medicine | Admitting: Internal Medicine

## 2016-08-03 ENCOUNTER — Ambulatory Visit (INDEPENDENT_AMBULATORY_CARE_PROVIDER_SITE_OTHER): Payer: Medicare Other | Admitting: Family Medicine

## 2016-08-03 ENCOUNTER — Telehealth: Payer: Self-pay | Admitting: Pharmacist Clinician (PhC)/ Clinical Pharmacy Specialist

## 2016-08-03 ENCOUNTER — Encounter: Payer: Self-pay | Admitting: Family Medicine

## 2016-08-03 VITALS — BP 162/100 | HR 109 | Resp 17 | Wt 199.6 lb

## 2016-08-03 DIAGNOSIS — R200 Anesthesia of skin: Secondary | ICD-10-CM | POA: Diagnosis not present

## 2016-08-03 DIAGNOSIS — Z6833 Body mass index (BMI) 33.0-33.9, adult: Secondary | ICD-10-CM | POA: Insufficient documentation

## 2016-08-03 DIAGNOSIS — Z79891 Long term (current) use of opiate analgesic: Secondary | ICD-10-CM | POA: Insufficient documentation

## 2016-08-03 DIAGNOSIS — K219 Gastro-esophageal reflux disease without esophagitis: Secondary | ICD-10-CM | POA: Diagnosis not present

## 2016-08-03 DIAGNOSIS — Z5181 Encounter for therapeutic drug level monitoring: Secondary | ICD-10-CM

## 2016-08-03 DIAGNOSIS — B3731 Acute candidiasis of vulva and vagina: Secondary | ICD-10-CM

## 2016-08-03 DIAGNOSIS — Z79899 Other long term (current) drug therapy: Secondary | ICD-10-CM | POA: Insufficient documentation

## 2016-08-03 DIAGNOSIS — Z8673 Personal history of transient ischemic attack (TIA), and cerebral infarction without residual deficits: Secondary | ICD-10-CM | POA: Insufficient documentation

## 2016-08-03 DIAGNOSIS — J9611 Chronic respiratory failure with hypoxia: Secondary | ICD-10-CM | POA: Diagnosis not present

## 2016-08-03 DIAGNOSIS — Z87891 Personal history of nicotine dependence: Secondary | ICD-10-CM | POA: Diagnosis not present

## 2016-08-03 DIAGNOSIS — R42 Dizziness and giddiness: Secondary | ICD-10-CM | POA: Diagnosis not present

## 2016-08-03 DIAGNOSIS — J449 Chronic obstructive pulmonary disease, unspecified: Secondary | ICD-10-CM | POA: Diagnosis not present

## 2016-08-03 DIAGNOSIS — M797 Fibromyalgia: Secondary | ICD-10-CM | POA: Diagnosis not present

## 2016-08-03 DIAGNOSIS — Z7901 Long term (current) use of anticoagulants: Secondary | ICD-10-CM | POA: Diagnosis not present

## 2016-08-03 DIAGNOSIS — B373 Candidiasis of vulva and vagina: Secondary | ICD-10-CM | POA: Diagnosis not present

## 2016-08-03 DIAGNOSIS — Z9981 Dependence on supplemental oxygen: Secondary | ICD-10-CM | POA: Insufficient documentation

## 2016-08-03 DIAGNOSIS — I480 Paroxysmal atrial fibrillation: Secondary | ICD-10-CM | POA: Insufficient documentation

## 2016-08-03 DIAGNOSIS — A6000 Herpesviral infection of urogenital system, unspecified: Secondary | ICD-10-CM | POA: Insufficient documentation

## 2016-08-03 DIAGNOSIS — J438 Other emphysema: Secondary | ICD-10-CM | POA: Diagnosis present

## 2016-08-03 DIAGNOSIS — I10 Essential (primary) hypertension: Secondary | ICD-10-CM | POA: Insufficient documentation

## 2016-08-03 DIAGNOSIS — R112 Nausea with vomiting, unspecified: Secondary | ICD-10-CM | POA: Insufficient documentation

## 2016-08-03 DIAGNOSIS — D62 Acute posthemorrhagic anemia: Secondary | ICD-10-CM

## 2016-08-03 DIAGNOSIS — E669 Obesity, unspecified: Secondary | ICD-10-CM | POA: Insufficient documentation

## 2016-08-03 DIAGNOSIS — M6281 Muscle weakness (generalized): Secondary | ICD-10-CM

## 2016-08-03 DIAGNOSIS — Z8679 Personal history of other diseases of the circulatory system: Secondary | ICD-10-CM

## 2016-08-03 DIAGNOSIS — E785 Hyperlipidemia, unspecified: Secondary | ICD-10-CM | POA: Diagnosis not present

## 2016-08-03 DIAGNOSIS — R040 Epistaxis: Secondary | ICD-10-CM

## 2016-08-03 DIAGNOSIS — F329 Major depressive disorder, single episode, unspecified: Secondary | ICD-10-CM | POA: Insufficient documentation

## 2016-08-03 DIAGNOSIS — Z86718 Personal history of other venous thrombosis and embolism: Secondary | ICD-10-CM | POA: Insufficient documentation

## 2016-08-03 DIAGNOSIS — E876 Hypokalemia: Secondary | ICD-10-CM

## 2016-08-03 DIAGNOSIS — D649 Anemia, unspecified: Secondary | ICD-10-CM

## 2016-08-03 LAB — BASIC METABOLIC PANEL
Anion gap: 6 (ref 5–15)
BUN: 14 mg/dL (ref 6–20)
CALCIUM: 7 mg/dL — AB (ref 8.9–10.3)
CO2: 21 mmol/L — AB (ref 22–32)
CREATININE: 0.57 mg/dL (ref 0.44–1.00)
Chloride: 110 mmol/L (ref 101–111)
GFR calc non Af Amer: >60 mL/min (ref >60)
Glucose, Bld: 101 mg/dL — ABNORMAL HIGH (ref 65–99)
Potassium: 2.7 mmol/L — CL (ref 3.5–5.1)
SODIUM: 137 mmol/L (ref 135–145)

## 2016-08-03 LAB — CBC WITH DIFFERENTIAL/PLATELET
BASOS ABS: 0 10*3/uL (ref 0.0–0.1)
Basophils Relative: 0 %
Eosinophils Absolute: 0 10*3/uL (ref 0.0–0.7)
Eosinophils Relative: 1 %
HEMATOCRIT: 31.5 % — AB (ref 36.0–46.0)
Hemoglobin: 10.6 g/dL — ABNORMAL LOW (ref 12.0–15.0)
LYMPHS PCT: 16 %
Lymphs Abs: 1.3 10*3/uL (ref 0.7–4.0)
MCH: 31.7 pg (ref 26.0–34.0)
MCHC: 33.7 g/dL (ref 30.0–36.0)
MCV: 94.3 fL (ref 78.0–100.0)
Monocytes Absolute: 0.5 10*3/uL (ref 0.1–1.0)
Monocytes Relative: 7 %
NEUTROS ABS: 6.4 10*3/uL (ref 1.7–7.7)
Neutrophils Relative %: 76 %
Platelets: 214 10*3/uL (ref 150–400)
RBC: 3.34 MIL/uL — AB (ref 3.87–5.11)
RDW: 13 % (ref 11.5–15.5)
WBC: 8.3 10*3/uL (ref 4.0–10.5)

## 2016-08-03 LAB — PROTIME-INR
INR: 4.81 — AB
Prothrombin Time: 46.4 seconds — ABNORMAL HIGH (ref 11.4–15.2)

## 2016-08-03 LAB — HERPES SIMPLEX VIRUS CULTURE: ORGANISM ID, BACTERIA: DETECTED

## 2016-08-03 MED ORDER — POLYETHYLENE GLYCOL 3350 17 G PO PACK
17.0000 g | PACK | Freq: Every day | ORAL | Status: DC | PRN
  Filled 2016-08-03: qty 1

## 2016-08-03 MED ORDER — TRAZODONE HCL 50 MG PO TABS
100.0000 mg | ORAL_TABLET | Freq: Every day | ORAL | Status: DC
Start: 2016-08-03 — End: 2016-08-06
  Administered 2016-08-04 – 2016-08-05 (×2): 100 mg via ORAL
  Filled 2016-08-03 (×2): qty 2

## 2016-08-03 MED ORDER — PANTOPRAZOLE SODIUM 40 MG PO TBEC
40.0000 mg | DELAYED_RELEASE_TABLET | Freq: Every morning | ORAL | Status: DC
  Administered 2016-08-04 – 2016-08-06 (×3): 40 mg via ORAL
  Filled 2016-08-03 (×3): qty 1

## 2016-08-03 MED ORDER — VITAMIN K1 10 MG/ML IJ SOLN
5.0000 mg | Freq: Once | INTRAVENOUS | Status: AC
  Administered 2016-08-03: 5 mg via INTRAVENOUS
  Filled 2016-08-03: qty 0.5

## 2016-08-03 MED ORDER — SODIUM CHLORIDE 0.9 % IV BOLUS (SEPSIS)
500.0000 mL | Freq: Once | INTRAVENOUS | Status: AC
  Administered 2016-08-03: 500 mL via INTRAVENOUS

## 2016-08-03 MED ORDER — POTASSIUM CHLORIDE CRYS ER 20 MEQ PO TBCR
40.0000 meq | EXTENDED_RELEASE_TABLET | Freq: Once | ORAL | Status: DC
  Filled 2016-08-03: qty 2

## 2016-08-03 MED ORDER — VALACYCLOVIR HCL 500 MG PO TABS
1000.0000 mg | ORAL_TABLET | Freq: Two times a day (BID) | ORAL | Status: DC
  Administered 2016-08-04 – 2016-08-06 (×5): 1000 mg via ORAL
  Filled 2016-08-03 (×7): qty 2

## 2016-08-03 MED ORDER — GABAPENTIN 300 MG PO CAPS
300.0000 mg | ORAL_CAPSULE | Freq: Every evening | ORAL | Status: DC
  Administered 2016-08-04 – 2016-08-06 (×3): 300 mg via ORAL
  Filled 2016-08-03 (×3): qty 1

## 2016-08-03 MED ORDER — DIAZEPAM 2 MG PO TABS
2.0000 mg | ORAL_TABLET | ORAL | Status: DC | PRN
  Administered 2016-08-04 – 2016-08-05 (×2): 2 mg via ORAL
  Filled 2016-08-03 (×2): qty 1

## 2016-08-03 MED ORDER — METOPROLOL SUCCINATE ER 25 MG PO TB24
25.0000 mg | ORAL_TABLET | Freq: Every day | ORAL | Status: DC
  Administered 2016-08-04 – 2016-08-05 (×2): 25 mg via ORAL
  Filled 2016-08-03 (×2): qty 1

## 2016-08-03 MED ORDER — SODIUM CHLORIDE 0.9% FLUSH
3.0000 mL | Freq: Two times a day (BID) | INTRAVENOUS | Status: DC
  Administered 2016-08-03 – 2016-08-06 (×6): 3 mL via INTRAVENOUS

## 2016-08-03 MED ORDER — DESVENLAFAXINE SUCCINATE ER 50 MG PO TB24
50.0000 mg | ORAL_TABLET | Freq: Every day | ORAL | Status: DC
Start: 2016-08-04 — End: 2016-08-06
  Administered 2016-08-04 – 2016-08-06 (×3): 50 mg via ORAL
  Filled 2016-08-03 (×3): qty 1

## 2016-08-03 MED ORDER — AMOXICILLIN-POT CLAVULANATE 875-125 MG PO TABS: 1.0000 | ORAL_TABLET | Freq: Two times a day (BID) | ORAL | Status: DC

## 2016-08-03 MED ORDER — OXYBUTYNIN CHLORIDE ER 5 MG PO TB24
10.0000 mg | ORAL_TABLET | Freq: Every morning | ORAL | Status: DC
  Administered 2016-08-04: 5 mg via ORAL
  Administered 2016-08-05 – 2016-08-06 (×2): 10 mg via ORAL
  Filled 2016-08-03 (×3): qty 2

## 2016-08-03 MED ORDER — VITAMIN K1 10 MG/ML IJ SOLN
2.5000 mg | Freq: Once | INTRAMUSCULAR | Status: AC
  Administered 2016-08-03: 2.5 mg via SUBCUTANEOUS
  Filled 2016-08-03: qty 0.25

## 2016-08-03 MED ORDER — MONTELUKAST SODIUM 10 MG PO TABS
10.0000 mg | ORAL_TABLET | Freq: Every day | ORAL | Status: DC
  Administered 2016-08-04 – 2016-08-05 (×2): 10 mg via ORAL
  Filled 2016-08-03 (×3): qty 1

## 2016-08-03 MED ORDER — POTASSIUM CHLORIDE 10 MEQ/100ML IV SOLN
10.0000 meq | INTRAVENOUS | Status: AC
  Administered 2016-08-03 – 2016-08-04 (×3): 10 meq via INTRAVENOUS
  Filled 2016-08-03 (×2): qty 100

## 2016-08-03 MED ORDER — VITAMIN K1 10 MG/ML IJ SOLN: 5.0000 mg | Freq: Once | INTRAMUSCULAR | Status: DC

## 2016-08-03 MED ORDER — HYDROCODONE-ACETAMINOPHEN 5-325 MG PO TABS
1.0000 | ORAL_TABLET | Freq: Two times a day (BID) | ORAL | Status: DC
  Administered 2016-08-04 – 2016-08-06 (×4): 1 via ORAL
  Filled 2016-08-03 (×6): qty 1

## 2016-08-03 NOTE — ED Notes (Signed)
EDP aware that Pt has a slow, controlled hemmorhage around the rhino in R nare.  Pt reports to this Rn that she has a normal O2 level at home of 87%.  RN reduced O2 flow to 2.

## 2016-08-03 NOTE — ED Notes (Signed)
Per Pt, she wears 1.5-2L O2 via [place] at home when stressed, exercising or sleeping.  RN began O2 at 2L, then increased to 3L to reach 94-96% O2 level.

## 2016-08-03 NOTE — ED Provider Notes (Signed)
[facility] DEPT Provider Note   CSN: [id number] Arrival date & time: 08/03/16  1603     History   Chief Complaint Chief Complaint  Patient presents with  . Epistaxis    HPI Laurie Horton is a 70 y.o. female.  She presents for evaluation of nosebleed which started several days ago. She saw her PCP about 5 days ago and at that time was having bleeding as well as urinary tract symptoms. She was treated with amoxicillin. She has not had her INR checked in several weeks. She complains of coughing up "blood clots". She denies chest pain, nausea, vomiting, cough or chest pain. There are no other known modifying factors.  HPI  Past Medical History:  Diagnosis Date  . Complication of anesthesia    various issues with oxygen  saturations post op  . COPD (chronic obstructive pulmonary disease) ([place])   . Depression   . Diverticulitis   . DVT (deep venous thrombosis) ([place])   . Fibromyalgia   . GERD (gastroesophageal reflux disease)   . Hiatal hernia   . History of deviated nasal septum    left- side  . HTN (hypertension)   . Hyperlipidemia   . Obesity   . On supplemental oxygen therapy    concentrator at night @ 1.5 l/m or when sleeps. O2 Sat niormally 87.  . Osteoporosis   . PAT (paroxysmal atrial tachycardia) ([place])   . PFO (patent foramen ovale)   . PULMONARY NODULE, LEFT LOWER LOBE 10/14/2009   55mm LLL nodule dec 2010. Stable and 68mm in Oct 2012. No further fu  . Right middle lobe pneumonia ([place]) 07/24/2011   First noted at admit 07/10/11. Persists on cxr 07/22/11. Cleared on CT 08/24/11. No further followup  . Stroke ([place])   . TOBACCO ABUSE 06/04/2009    Patient Active Problem List   Diagnosis Date Noted  . Chronic obstructive pulmonary disease, unspecified copd, unspecified chronic bronchitis type 01/14/2016  . Drooling 08/14/2015  . Memory loss 08/14/2015  . MCI (mild cognitive impairment) 08/14/2015  . Carotid stenosis 04/02/2015  . Long-term (current) use of  anticoagulants 09/26/2014  . PAF (paroxysmal atrial fibrillation) ([place]) 09/19/2014  . Chronic respiratory failure with hypoxia ([place]) 06/27/2014  . COPD exacerbation ([place]) 06/27/2014  . Chest wall pain 04/06/2014  . Abnormal ECG 05/10/2013  . Atypical chest pain 05/04/2013  . Preoperative respiratory examination 05/02/2013  . Hyperglycemia, drug-induced 10/12/2011  . Encounter for long-term (current) use of medications 08/21/2011  . PAT (paroxysmal atrial tachycardia) ([place]) 08/21/2011  . Right middle lobe pneumonia ([place]) 07/24/2011  . Oral thrush 07/24/2011  . UPPER RESPIRATORY INFECTION, VIRAL 07/11/2010  . OTHER ACUTE SINUSITIS 06/04/2010  . PULMONARY NODULE, LEFT LOWER LOBE 10/14/2009  . PATENT FORAMEN OVALE 10/14/2009  . DEPRESSION 06/04/2009  . Essential hypertension 06/04/2009  . COPD (chronic obstructive pulmonary disease) ([place]) 06/04/2009  . G E R D 06/04/2009  . SNORING, HX OF 06/04/2009    Past Surgical History:  Procedure Laterality Date  . CARDIOVASCULAR STRESS TEST  12/26/2004   EF 74%. NO EVIDENCE OF ISCHEMIA  . ESOPHAGOGASTRODUODENOSCOPY (EGD) WITH PROPOFOL N/A 04/15/2015   Procedure: ESOPHAGOGASTRODUODENOSCOPY (EGD) WITH PROPOFOL;  Surgeon: Laurence Spates, MD;  Location: [facility] ENDOSCOPY;  Service: Endoscopy;  Laterality: N/A;  . KNEE ARTHROSCOPY  2000   left  . LAPAROSCOPIC CHOLECYSTECTOMY  04-16-2010   [place]  . MOUTH SURGERY     03-26-15 multiple extractions stitches remains  . TOTAL ABDOMINAL HYSTERECTOMY     post  op needed oxygen was told "she gave them a scare"  . TUBAL LIGATION    . US ECHOCARDIOGRAPHY  11/20/2009   EF 55-60%    OB History    No data available       Home Medications    Prior to Admission medications   Medication Sig Start Date End Date Taking? Authorizing Provider  amoxicillin-clavulanate (AUGMENTIN) 875-125 MG tablet Take 1 tablet by mouth 2 (two) times daily. 07/31/16 08/10/16 Yes Stephanie D English, PA  diazepam (VALIUM) 2 MG  tablet Take 1 tablet (2 mg total) by mouth as needed for anxiety. 12/04/15  Yes Kathlee Nations, MD  gabapentin (NEURONTIN) 300 MG capsule Take 300 mg by mouth every evening.    Yes Historical Provider, MD  HYDROcodone-acetaminophen (NORCO/VICODIN) 5-325 MG tablet Take 1 tablet by mouth 2 (two) times daily.    Yes Historical Provider, MD  metoprolol succinate (TOPROL XL) 25 MG 24 hr tablet Take 1 tablet (25 mg total) by mouth daily. 06/08/16  Yes Peter M Martinique, MD  montelukast (SINGULAIR) 10 MG tablet TAKE 1 TABLET BY MOUTH AT BEDTIME Patient taking differently: TAKE 1 TABLET BY MOUTH AT DAILY 07/21/16  Yes [name], MD  oxybutynin (DITROPAN-XL) 10 MG 24 hr tablet Take 10 mg by mouth every morning. Reported on 02/06/2016 11/28/14  Yes Historical Provider, MD  OXYGEN Inhale 1.5 L into the lungs. For naps and sleep.   Yes Historical Provider, MD  pantoprazole (PROTONIX) 40 MG tablet Take 1 tablet by mouth every morning.  07/24/13  Yes Historical Provider, MD  PRISTIQ 50 MG 24 hr tablet TAKE 1 TABLET BY MOUTH EVERY DAY 07/20/16  Yes Kathlee Nations, MD  traZODone (DESYREL) 100 MG tablet TAKE 1 TABLET (100 MG TOTAL) BY MOUTH AT BEDTIME. 06/15/16  Yes Kathlee Nations, MD  valACYclovir (VALTREX) 1000 MG tablet Take 1 tablet (1,000 mg total) by mouth 2 (two) times daily. 07/31/16 08/07/16 Yes Stephanie D English, PA  warfarin (COUMADIN) 5 MG tablet Take 1 tablet by mouth daily or as directed by coumadin clinic Patient taking differently: Take 2.5-5 mg by mouth daily. Take 0.5 tablet (2.5 mg) on Sun, Tues, Thurs and Take 1 tablet (5 mg) on Mon, Wed, Fri, Sat 05/11/16  Yes Peter M Martinique, MD  polyethylene glycol [facility] / Floria Raveling) packet Take 17 g by mouth daily as needed for mild constipation.     Historical Provider, MD  rosuvastatin (CRESTOR) 5 MG tablet Take 1 tablet (5 mg total) by mouth daily. Patient not taking: Reported on 08/03/2016 07/03/16 10/01/16  Isaiah Serge, NP  Zinc Oxide 12 % CREA Apply 1 application  topically 2 (two) times daily. 07/31/16   Joretta Bachelor, PA    Family History Family History  Problem Relation Age of Onset  . Dementia Mother   . Diabetes Mother   . Alzheimer's disease Mother   . Heart attack Brother 14  . Heart attack Father   . Schizophrenia Sister   . Diabetes Sister   . Tremor Sister     Social History Social History  Substance Use Topics  . Smoking status: Former Smoker    Quit date: 06/02/2010  . Smokeless tobacco: Never Used  . Alcohol use No     Allergies   Alprazolam; Pseudoephedrine hcl er; Budesonide-formoterol fumarate; Aciphex [rabeprazole sodium]; Avelox [moxifloxacin hcl in nacl]; Esomeprazole magnesium; Flonase [fluticasone propionate]; Iodine; Loratadine; Lotrimin [clotrimazole]; Lunesta [eszopiclone]; Other; Oxcarbazepine; Statins; Zolpidem tartrate; Bextra [valdecoxib]; Ceclor [cefaclor];  Covera-hs [verapamil hcl]; Dicyclomine hcl; and Tessalon perles   Review of Systems Review of Systems  All other systems reviewed and are negative.    Physical Exam Updated Vital Signs BP 130/98 (BP Location: Left Arm)   Pulse 109   Resp 18   SpO2 (!) 87% Comment: Pt reports that she stays around 87% on RA or 1-2L via [place] at home.  Her PCP is aware.  COPD Hx.  Physical Exam  Constitutional: She is oriented to person, place, and time. She appears well-developed and well-nourished.  HENT:  Head: Normocephalic and atraumatic.  Right nares bleeding, active, with clots from nares, and blood in oropharynx.  Eyes: Conjunctivae and EOM are normal. Pupils are equal, round, and reactive to light.  Neck: Normal range of motion and phonation normal. Neck supple.  Cardiovascular: Regular rhythm.   Tachycardia  Pulmonary/Chest: Effort normal and breath sounds normal. She exhibits no tenderness.  Abdominal: Soft. She exhibits no distension. There is no tenderness. There is no guarding.  Musculoskeletal: Normal range of motion.  Neurological: She is  alert and oriented to person, place, and time. She exhibits normal muscle tone.  Skin: Skin is warm and dry.  Psychiatric: She has a normal mood and affect. Her behavior is normal. Judgment and thought content normal.  Nursing note and vitals reviewed.    ED Treatments / Results  Labs (all labs ordered are listed, but only abnormal results are displayed) Labs Reviewed  CBC WITH DIFFERENTIAL/PLATELET - Abnormal; Notable for the following:       Result Value   RBC 3.34 (*)    Hemoglobin 10.6 (*)    HCT 31.5 (*)    All other components within normal limits  BASIC METABOLIC PANEL - Abnormal; Notable for the following:    Potassium 2.7 (*)    CO2 21 (*)    Glucose, Bld 101 (*)    Calcium 7.0 (*)    All other components within normal limits  PROTIME-INR - Abnormal; Notable for the following:    Prothrombin Time 46.4 (*)    INR 4.81 (*)    All other components within normal limits    EKG  EKG Interpretation None       Radiology No results found.  Procedures Procedures (including critical care time)  Medications Ordered in ED Medications  potassium chloride SA (K-DUR,KLOR-CON) CR tablet 40 mEq (not administered)  potassium chloride 10 mEq in 100 mL IVPB (not administered)  sodium chloride 0.9 % bolus 500 mL (500 mLs Intravenous New Bag/Given 08/03/16 1711)  phytonadione (VITAMIN K) SQ injection 2.5 mg (2.5 mg Subcutaneous Given 08/03/16 1943)     Initial Impression / Assessment and Plan / ED Course  I have reviewed the triage vital signs and the nursing notes.  Pertinent labs & imaging results that were available during my care of the patient were reviewed by me and considered in my medical decision making (see chart for details).  Clinical Course  Comment By Time  Patient reports the nose bleeding has improved, and is now only "dripping". She remains alert and cooperative. Daleen Bo, MD 10/02 2002    Medications  potassium chloride SA (K-DUR,KLOR-CON) CR  tablet 40 mEq (not administered)  potassium chloride 10 mEq in 100 mL IVPB (not administered)  sodium chloride 0.9 % bolus 500 mL (500 mLs Intravenous New Bag/Given 08/03/16 1711)  phytonadione (VITAMIN K) SQ injection 2.5 mg (2.5 mg Subcutaneous Given 08/03/16 1943)    Patient Vitals for the past 24  hrs:  BP Pulse Resp SpO2  08/03/16 1729 130/98 109 18 (!) 87 %  08/03/16 1700 - - - 94 %  08/03/16 1658 - - - (!) 85 %  08/03/16 1647 - - - 95 %  08/03/16 1629 (!) 127/114 110 21 (!) 89 %    16:25- epistaxis treatment. After patient blowing nose, and instilling Afrin, a rapid Rhino 5.5 cm was inserted without difficulty in the right nares. The balloon was gradually inflated to 7.5 cm with air. This improved bleeding, immediately.  8:09 PM Reevaluation with update and discussion. After initial assessment and treatment, an updated evaluation reveals patient comfortable at this time. Findings discussed with the patient and all questions were answered. Kyliee Ortego L    8:05 PM-Consult complete with Hospitalist. Patient case explained and discussed. He agrees to admit patient for further evaluation and treatment. Call ended at [place] Performed by: Richarda Blade Total critical care time: 45 minutes Critical care time was exclusive of separately billable procedures and treating other patients. Critical care was necessary to treat or prevent imminent or life-threatening deterioration. Critical care was time spent personally by me on the following activities: development of treatment plan with patient and/or surrogate as well as nursing, discussions with consultants, evaluation of patient's response to treatment, examination of patient, obtaining history from patient or surrogate, ordering and performing treatments and interventions, ordering and review of laboratory studies, ordering and review of radiographic studies, pulse oximetry and re-evaluation of patient's condition.   Final  Clinical Impressions(s) / ED Diagnoses   Final diagnoses:  Epistaxis  Anemia, unspecified type  Hypokalemia   Epistaxis, with prolonged INR. Patient Coumadin, for interval ablation. Patient treated with vitamin K, to improve INR. She will require repeat INR testing about 4 hours after vitamin K given. He may require a second dose of 2 and half milligrams at that time. Hemoglobin dropped 4 g from baseline. Incidental hypokalemia treated orally and intravenously. She will need to be admitted for monitoring.  Nursing Notes Reviewed/ Care Coordinated, and agree without changes. Applicable Imaging Reviewed.  Interpretation of Laboratory Data incorporated into ED treatment  Plan: Admit  New Prescriptions New Prescriptions   No medications on file     Daleen Bo, MD 08/03/16 2023

## 2016-08-03 NOTE — ED Notes (Signed)
inr 4.81

## 2016-08-03 NOTE — ED Notes (Addendum)
This RN spoke to Pts neighbor, Lola, with Pts permission.  Lola states that she or one of the other neighbors will pick Pt up when it is time for Pt to be discharge.  [phone].  Pts daughter, Seraphine can be reached [phone].

## 2016-08-03 NOTE — ED Triage Notes (Signed)
Pt is from home.  She was sent here by Urgent Care.  Pt has had epistaxis intermittently since Thur.  Pt awoke today with continuous bleeding.  Urgent Care tried to use Rhino rocket but could not due to deviated septum.  EMS used Afrin which slowed flow to ooze.

## 2016-08-03 NOTE — Patient Instructions (Signed)
Patient received verbal instructions as to what we were doing incentive to the emergency room.

## 2016-08-03 NOTE — Progress Notes (Signed)
Patient ID: Laurie Horton, female    DOB: 08-17-1946  Age: 70 y.o. MRN: [id number]  No chief complaint on file.  I was called to see the patient urgently. Subjective:   Patient comes in with history of having had a nosebleed since last Thursday. He is been off and on, but today has been persistent for the constant dripping. She gets big globs of blood in the back of her throat that she chokes on. She has a history of chronic hypoxemia and uses home O2 difficult to even use it with a nose bleeding. She has a history of atrial fibrillation intermittently and is on Coumadin. Her last pro time was apparently 3.7  3 weeks ago. Some changes were made in scheduled taking it, but it is not been rechecked. Has never had nosebleeds like this before. She does have a history of a deviated septum.  Current allergies, medications, problem list, past/family and social histories reviewed.  Objective:  There were no vitals taken for this visit.  Patient was steadily dripping blood from her nose. Direct pressure was applied for 10 minutes without any slowing of the bleeding. The nasal tampon device was attempted and to insert. It would not insert and cause her great deal of pain. Direct pressure was continued to fly. She started bleeding more, with some coming out the left side of her nose. Blood pressure was a little elevated for her when she came in at 156/90, but when I repeated it was 100/70. Chest is clear. Heart regular without murmur. Patient remained alert and oriented. It was decided to send her to the emergency room, that it was unsafe for her to drive bleeding so much. About that time she also started complaining of numbness down the left arm. An IV was started and EMS. She was sent to the hospital for stopping of the nosebleed and control of her pro time. As well as violation of the numbness in the arm.  Over 45 minutes was spent in direct care, but since I was advancing the nose was unable to do  extensive chart review or other assessment of her. Assessment & Plan:   Assessment: 1. Epistaxis   2. History of atrial fibrillation   3. Alteration in anticoagulation   4. Left arm numbness       Plan: Sent to the emergency room for control nosebleed, evaluation of left arm numbness, and control of the pro time. Not have ability to do a pro time here.  Over 45 minutes was spent in direct patient care and compressing her nose. Attempt to insert the Rapid Rhino failed.        Patient Instructions  Patient received verbal instructions as to what we were doing incentive to the emergency room.    Return if symptoms worsen or fail to improve.   Vadim Centola, MD 08/03/2016

## 2016-08-03 NOTE — ED Notes (Signed)
Pt IV will not flow well on gravity or on pump.  Site maintenance provided by RN.  IV will flush and draw back blood but will still not allow flow of fluids by gravity or pump.  2 IV start attempts by RN.

## 2016-08-03 NOTE — Progress Notes (Signed)
Went to start IV in room 20, patient is about to go to the bathroom. RN made aware that will pass it on to next shift IV team RN.

## 2016-08-03 NOTE — H&P (Signed)
History and Physical    Laurie Horton [id number] DOB: 07/20/1946 DOA: 08/03/2016   PCP: [name], MD Chief Complaint:  Chief Complaint  Patient presents with  . Epistaxis    HPI: Laurie Horton is a 70 y.o. female with medical history significant of PAF on coumadin, COPD on chronic 1.5L O2 at baseline.  Patient was started on amoxicillin last week for presumed UTI (actually ended up being HSV, also started on valtrex at same time), developed nose bleed 5 days ago which has been constant and persistent for past 5 days.  Not had INR checked in several weeks.  ED Course: INR 4.8, HGB 10 down from 14 on wed last week.  Anterior packing put in place.  Patient O2 requirement increased due to not being able to breathing through her bloody nose.  Review of Systems: As per HPI otherwise 10 point review of systems negative.    Past Medical History:  Diagnosis Date  . Complication of anesthesia    various issues with oxygen  saturations post op  . COPD (chronic obstructive pulmonary disease) ([place])   . Depression   . Diverticulitis   . DVT (deep venous thrombosis) ([place])   . Fibromyalgia   . GERD (gastroesophageal reflux disease)   . Hiatal hernia   . History of deviated nasal septum    left- side  . HTN (hypertension)   . Hyperlipidemia   . Obesity   . On supplemental oxygen therapy    concentrator at night @ 1.5 l/m or when sleeps. O2 Sat niormally 87.  . Osteoporosis   . PAT (paroxysmal atrial tachycardia) ([place])   . PFO (patent foramen ovale)   . PULMONARY NODULE, LEFT LOWER LOBE 10/14/2009   64mm LLL nodule dec 2010. Stable and 61mm in Oct 2012. No further fu  . Right middle lobe pneumonia ([place]) 07/24/2011   First noted at admit 07/10/11. Persists on cxr 07/22/11. Cleared on CT 08/24/11. No further followup  . Stroke ([place])   . TOBACCO ABUSE 06/04/2009    Past Surgical History:  Procedure Laterality Date  . CARDIOVASCULAR STRESS TEST  12/26/2004   EF 74%. NO EVIDENCE OF  ISCHEMIA  . ESOPHAGOGASTRODUODENOSCOPY (EGD) WITH PROPOFOL N/A 04/15/2015   Procedure: ESOPHAGOGASTRODUODENOSCOPY (EGD) WITH PROPOFOL;  Surgeon: Laurence Spates, MD;  Location: [facility];  Service: Endoscopy;  Laterality: N/A;  . KNEE ARTHROSCOPY  2000   left  . LAPAROSCOPIC CHOLECYSTECTOMY  04-16-2010   [place]  . MOUTH SURGERY     03-26-15 multiple extractions stitches remains  . TOTAL ABDOMINAL HYSTERECTOMY     post op needed oxygen was told "she gave them a scare"  . TUBAL LIGATION    . US ECHOCARDIOGRAPHY  11/20/2009   EF 55-60%     reports that she quit smoking about 6 years ago. She has never used smokeless tobacco. She reports that she does not drink alcohol or use drugs.  Allergies  Allergen Reactions  . Alprazolam     REACTION: stops breathing  . Pseudoephedrine Hcl Er Shortness Of Breath  . Budesonide-Formoterol Fumarate     bliusters  . Aciphex [Rabeprazole Sodium]     rash  . Avelox [Moxifloxacin Hcl In Nacl]     Stomach cramps.   . Esomeprazole Magnesium     REACTION: "bouncing off walls"  . Flonase [Fluticasone Propionate]   . Iodine     REACTION: swelling in throat  . Loratadine     claritin D causes shaking  . Lotrimin [Clotrimazole]  Other (See Comments)    blisters  . Lunesta [Eszopiclone]     REACTION: "slept for a week"  . Other     Glue from ekg/heart monitor leads --rash +  Any MYCINS  . Oxcarbazepine Other (See Comments)    Causes deep sleep and dizziness  . Statins Other (See Comments)  . Zolpidem Tartrate     REACTION: "slept for a week"  . Bextra [Valdecoxib] Rash  . Ceclor [Cefaclor] Rash  . Covera-Hs [Verapamil Hcl] Palpitations  . Dicyclomine Hcl Rash  . Tessalon Perles Rash    Family History  Problem Relation Age of Onset  . Dementia Mother   . Diabetes Mother   . Alzheimer's disease Mother   . Heart attack Brother 42  . Heart attack Father   . Schizophrenia Sister   . Diabetes Sister   . Tremor Sister       Prior to  Admission medications   Medication Sig Start Date End Date Taking? Authorizing Provider  amoxicillin-clavulanate (AUGMENTIN) 875-125 MG tablet Take 1 tablet by mouth 2 (two) times daily. 07/31/16 08/10/16 Yes Stephanie D English, PA  diazepam (VALIUM) 2 MG tablet Take 1 tablet (2 mg total) by mouth as needed for anxiety. 12/04/15  Yes Kathlee Nations, MD  gabapentin (NEURONTIN) 300 MG capsule Take 300 mg by mouth every evening.    Yes Historical Provider, MD  HYDROcodone-acetaminophen (NORCO/VICODIN) 5-325 MG tablet Take 1 tablet by mouth 2 (two) times daily.    Yes Historical Provider, MD  metoprolol succinate (TOPROL XL) 25 MG 24 hr tablet Take 1 tablet (25 mg total) by mouth daily. 06/08/16  Yes Peter M Martinique, MD  montelukast (SINGULAIR) 10 MG tablet TAKE 1 TABLET BY MOUTH AT BEDTIME Patient taking differently: TAKE 1 TABLET BY MOUTH AT DAILY 07/21/16  Yes [name], MD  oxybutynin (DITROPAN-XL) 10 MG 24 hr tablet Take 10 mg by mouth every morning. Reported on 02/06/2016 11/28/14  Yes Historical Provider, MD  OXYGEN Inhale 1.5 L into the lungs. For naps and sleep.   Yes Historical Provider, MD  pantoprazole (PROTONIX) 40 MG tablet Take 1 tablet by mouth every morning.  07/24/13  Yes Historical Provider, MD  PRISTIQ 50 MG 24 hr tablet TAKE 1 TABLET BY MOUTH EVERY DAY 07/20/16  Yes Kathlee Nations, MD  traZODone (DESYREL) 100 MG tablet TAKE 1 TABLET (100 MG TOTAL) BY MOUTH AT BEDTIME. 06/15/16  Yes Kathlee Nations, MD  valACYclovir (VALTREX) 1000 MG tablet Take 1 tablet (1,000 mg total) by mouth 2 (two) times daily. 07/31/16 08/07/16 Yes Stephanie D English, PA  warfarin (COUMADIN) 5 MG tablet Take 1 tablet by mouth daily or as directed by coumadin clinic Patient taking differently: Take 2.5-5 mg by mouth daily. Take 0.5 tablet (2.5 mg) on Sun, Tues, Thurs and Take 1 tablet (5 mg) on Mon, Wed, Fri, Sat 05/11/16  Yes Peter M Martinique, MD  polyethylene glycol [facility] / Floria Raveling) packet Take 17 g by mouth daily as  needed for mild constipation.     Historical Provider, MD  Zinc Oxide 12 % CREA Apply 1 application topically 2 (two) times daily. 07/31/16   Joretta Bachelor, PA    Physical Exam: Vitals:   08/03/16 1658 08/03/16 1700 08/03/16 1729 08/03/16 2018  BP:   130/98 134/76  Pulse:   109 101  Resp:   18 21  SpO2: (!) 85% 94% (!) 87% 94%      Constitutional: NAD, calm, comfortable Eyes:  PERRL, lids and conjunctivae normal ENMT: Mucous membranes are moist. Posterior pharynx clear of any exudate or lesions.Normal dentition.  Neck: normal, supple, no masses, no thyromegaly Respiratory: clear to auscultation bilaterally, no wheezing, no crackles. Normal respiratory effort. No accessory muscle use.  Cardiovascular: Regular rate and rhythm, no murmurs / rubs / gallops. No extremity edema. 2+ pedal pulses. No carotid bruits.  Abdomen: no tenderness, no masses palpated. No hepatosplenomegaly. Bowel sounds positive.  Musculoskeletal: no clubbing / cyanosis. No joint deformity upper and lower extremities. Good ROM, no contractures. Normal muscle tone.  Skin: no rashes, lesions, ulcers. No induration Neurologic: CN 2-12 grossly intact. Sensation intact, DTR normal. Strength 5/5 in all 4.  Psychiatric: Normal judgment and insight. Alert and oriented x 3. Normal mood.    Labs on Admission: I have personally reviewed following labs and imaging studies  CBC:  Recent Labs Lab 07/31/16 1503 08/03/16 1640  WBC 14.4* 8.3  NEUTROABS  --  6.4  HGB 14.5 10.6*  HCT 41.0 31.5*  MCV 91.5 94.3  PLT  --  [id number]   Basic Metabolic Panel:  Recent Labs Lab 08/03/16 1640  NA 137  K 2.7*  CL 110  CO2 21*  GLUCOSE 101*  BUN 14  CREATININE 0.57  CALCIUM 7.0*   GFR: Estimated Creatinine Clearance: 71.3 mL/min (by C-G formula based on SCr of 0.57 mg/dL). Liver Function Tests: No results for input(s): AST, ALT, ALKPHOS, BILITOT, PROT, ALBUMIN in the last 168 hours. No results for input(s): LIPASE,  AMYLASE in the last 168 hours. No results for input(s): AMMONIA in the last 168 hours. Coagulation Profile:  Recent Labs Lab 08/03/16 1640  INR 4.81*   Cardiac Enzymes: No results for input(s): CKTOTAL, CKMB, CKMBINDEX, TROPONINI in the last 168 hours. BNP (last 3 results) No results for input(s): PROBNP in the last 8760 hours. HbA1C: No results for input(s): HGBA1C in the last 72 hours. CBG: No results for input(s): GLUCAP in the last 168 hours. Lipid Profile: No results for input(s): CHOL, HDL, LDLCALC, TRIG, CHOLHDL, LDLDIRECT in the last 72 hours. Thyroid Function Tests: No results for input(s): TSH, T4TOTAL, FREET4, T3FREE, THYROIDAB in the last 72 hours. Anemia Panel: No results for input(s): VITAMINB12, FOLATE, FERRITIN, TIBC, IRON, RETICCTPCT in the last 72 hours. Urine analysis:    Component Value Date/Time   COLORURINE AMBER (A) 05/04/2013 1132   APPEARANCEUR CLOUDY (A) 05/04/2013 1132   LABSPEC 1.028 05/04/2013 1132   PHURINE 5.5 05/04/2013 1132   GLUCOSEU 100 (A) 05/04/2013 1132   HGBUR NEGATIVE 05/04/2013 1132   BILIRUBINUR negative 07/31/2016 1400   KETONESUR negative 07/31/2016 1400   KETONESUR 15 (A) 05/04/2013 1132   PROTEINUR negative 07/31/2016 1400   PROTEINUR 100 (A) 05/04/2013 1132   UROBILINOGEN 1.0 07/31/2016 1400   UROBILINOGEN 0.2 05/04/2013 1132   NITRITE Negative 07/31/2016 1400   NITRITE NEGATIVE 05/04/2013 1132   LEUKOCYTESUR Trace (A) 07/31/2016 1400   Sepsis Labs: @LABRCNTIP (procalcitonin:4,lacticidven:4) ) Recent Results (from the past 240 hour(s))  Herpes simplex virus culture     Status: None   Collection Time: 07/31/16  2:19 PM  Result Value Ref Range Status   Organism ID, Bacteria HERPES SIMPLEX VIRUS TYPE 2 DETECTED.  Final  Urine culture     Status: None   Collection Time: 07/31/16  2:30 PM  Result Value Ref Range Status   Organism ID, Bacteria   Final    Multiple organisms present,each less than 10,000 CFU/mL. These  organisms,commonly found on external and  internal genitalia,are considered colonizers. No further testing performed.      Radiological Exams on Admission: No results found.  EKG: Independently reviewed.  Assessment/Plan Principal Problem:   Epistaxis Active Problems:   COPD (chronic obstructive pulmonary disease) (HCC)   Chronic respiratory failure with hypoxia (HCC)   PAF (paroxysmal atrial fibrillation) (HCC)   Genital herpes   Acute blood loss anemia    1. Epistaxis and acute blood loss anemia - 1. bleeding reduced with packing in place 2. If persists may need ENT eval in AM 2. PAF - 1. Holding and reversing coumadin with Vit K 2. Continue rate ctrl meds 3. Tele monitor for HR 110s in ED 3. Genital herpes - 1. Continuing valtrex, stopping the amoxicillin which was empirically started, Urine culture is negative and this is just increasing her INR on coumadin 4. COPD - 1. O2 requirement increased and [place] currently in mouth due to nose bleed   DVT prophylaxis: SCDs, INR 4.8 Code Status: DNR confirmed with patient Family Communication: No family in room Consults called: none Admission status: Admit to inpatient   Etta Quill DO [facility] Pager [phone] from 7PM-7AM  If 7AM-7PM, please contact the day physician for the patient [email or web address] Password [facility]  08/03/2016, 8:36 PM

## 2016-08-03 NOTE — Telephone Encounter (Signed)
Patient LMOM at 1pm stating she had nosebleed since Thursday and was also coughing up large amounts of phlegm filled blood.  Stated in message that she went to UC on Friday, but they were "no help".    Returned call at 4pm, after stating to leave message phone was picked up by her neighbor, who stated that patient was on route to the ER via ambulance.

## 2016-08-04 DIAGNOSIS — J449 Chronic obstructive pulmonary disease, unspecified: Secondary | ICD-10-CM | POA: Diagnosis not present

## 2016-08-04 DIAGNOSIS — R040 Epistaxis: Secondary | ICD-10-CM | POA: Diagnosis not present

## 2016-08-04 DIAGNOSIS — F329 Major depressive disorder, single episode, unspecified: Secondary | ICD-10-CM | POA: Diagnosis not present

## 2016-08-04 DIAGNOSIS — D62 Acute posthemorrhagic anemia: Secondary | ICD-10-CM | POA: Diagnosis not present

## 2016-08-04 LAB — CBC
HEMATOCRIT: 36.4 % (ref 36.0–46.0)
HEMOGLOBIN: 12.5 g/dL (ref 12.0–15.0)
MCH: 31.8 pg (ref 26.0–34.0)
MCHC: 34.3 g/dL (ref 30.0–36.0)
MCV: 92.6 fL (ref 78.0–100.0)
PLATELETS: 276 10*3/uL (ref 150–400)
RBC: 3.93 MIL/uL (ref 3.87–5.11)
RDW: 13.1 % (ref 11.5–15.5)
WBC: 9.5 10*3/uL (ref 4.0–10.5)

## 2016-08-04 LAB — BASIC METABOLIC PANEL
ANION GAP: 8 (ref 5–15)
BUN: 15 mg/dL (ref 6–20)
CHLORIDE: 100 mmol/L — AB (ref 101–111)
CO2: 25 mmol/L (ref 22–32)
Calcium: 9.5 mg/dL (ref 8.9–10.3)
Creatinine, Ser: 0.73 mg/dL (ref 0.44–1.00)
GFR calc non Af Amer: >60 mL/min (ref >60)
Glucose, Bld: 128 mg/dL — ABNORMAL HIGH (ref 65–99)
POTASSIUM: 4 mmol/L (ref 3.5–5.1)
Sodium: 133 mmol/L — ABNORMAL LOW (ref 135–145)

## 2016-08-04 LAB — PROTIME-INR
INR: 1.47
Prothrombin Time: 17.9 seconds — ABNORMAL HIGH (ref 11.4–15.2)

## 2016-08-04 MED ORDER — METOPROLOL TARTRATE 5 MG/5ML IV SOLN
INTRAVENOUS | Status: AC
  Filled 2016-08-04: qty 5

## 2016-08-04 MED ORDER — METOPROLOL TARTRATE 5 MG/5ML IV SOLN
5.0000 mg | Freq: Once | INTRAVENOUS | Status: AC
  Administered 2016-08-04: 5 mg via INTRAVENOUS

## 2016-08-04 MED ORDER — WARFARIN - PHARMACIST DOSING INPATIENT: Freq: Every day | Status: DC

## 2016-08-04 MED ORDER — ACETAMINOPHEN 325 MG PO TABS
650.0000 mg | ORAL_TABLET | ORAL | Status: DC | PRN
  Administered 2016-08-04 – 2016-08-05 (×2): 650 mg via ORAL
  Filled 2016-08-04 (×2): qty 2

## 2016-08-04 NOTE — Progress Notes (Signed)
Writer notified by CCM that pt was in SVT with HR180-190. Pt sitting on side of bed complaining of a sudden onset of stabbing right leg pain. See VS in flow sheet.pt denies chest pain, SOB, or dizziness. EKG done.Dr. Doyle Askew notified. New order plced in Epic and followed out. Current HR 96-110. BP 147/112. Will continue to monitor.

## 2016-08-04 NOTE — Care Management CC44 (Signed)
Condition Code 44 Documentation Completed  Patient Details  Name: MARKYA ROSSEN MRN: [id number] Date of Birth: May 07, 1946   Condition Code 44 given:   yes Patient signature on Condition Code 44 notice:    Yes Documentation of 2 MD's agreement:   Yes Code 44 added to claim:   yes    Dessa Phi, RN 08/04/2016, 3:00 PM

## 2016-08-04 NOTE — Care Management Obs Status (Signed)
MEDICARE OBSERVATION STATUS NOTIFICATION   Patient Details  Name: Laurie Horton MRN: [id number] Date of Birth: Jun 26, 1946   Medicare Observation Status Notification Given:       Dessa Phi, RN 08/04/2016, 3:01 PM

## 2016-08-04 NOTE — Care Management Note (Signed)
Case Management Note  Patient Details  Name: Laurie Horton MRN: [id number] Date of Birth: November 21, 1945  Subjective/Objective:   70 y/o f admitted w/epistaxis. From home.                 Action/Plan:d/c plan home.   Expected Discharge Date:   (unknown)               Expected Discharge Plan:  Home/Self Care  In-House Referral:     Discharge planning Services  CM Consult  Post Acute Care Choice:    Choice offered to:     DME Arranged:    DME Agency:     HH Arranged:    HH Agency:     Status of Service:  In process, will continue to follow  If discussed at Long Length of Stay Meetings, dates discussed:    Additional Comments:  Dessa Phi, RN 08/04/2016, 11:23 AM

## 2016-08-04 NOTE — Progress Notes (Signed)
ANTICOAGULATION CONSULT NOTE - Initial Consult  Pharmacy Consult for Warfarin Indication: atrial fibrillation  Allergies  Allergen Reactions  . Alprazolam     REACTION: stops breathing  . Pseudoephedrine Hcl Er Shortness Of Breath  . Budesonide-Formoterol Fumarate     bliusters  . Aciphex [Rabeprazole Sodium]     rash  . Avelox [Moxifloxacin Hcl In Nacl]     Stomach cramps.   . Esomeprazole Magnesium     REACTION: "bouncing off walls"  . Flonase [Fluticasone Propionate]   . Iodine     REACTION: swelling in throat  . Loratadine     claritin D causes shaking  . Lotrimin [Clotrimazole] Other (See Comments)    blisters  . Lunesta [Eszopiclone]     REACTION: "slept for a week"  . Other     Glue from ekg/heart monitor leads --rash +  Any MYCINS  . Oxcarbazepine Other (See Comments)    Causes deep sleep and dizziness  . Statins Other (See Comments)  . Zolpidem Tartrate     REACTION: "slept for a week"  . Bextra [Valdecoxib] Rash  . Ceclor [Cefaclor] Rash  . Covera-Hs [Verapamil Hcl] Palpitations  . Dicyclomine Hcl Rash  . Tessalon Perles Rash    Patient Measurements: Height: 5\' 4"  (162.6 cm) Weight: 195 lb 1.6 oz (88.5 kg) IBW/kg (Calculated) : 54.7  Vital Signs: Temp: 98.1 F (36.7 C) (10/03 0739) Temp Source: Oral (10/03 0739) BP: 113/85 (10/03 1309) Pulse Rate: 78 (10/03 1309)  Labs:  Recent Labs  08/03/16 1640 08/04/16 0513  HGB 10.6* 12.5  HCT 31.5* 36.4  PLT 214 276  LABPROT 46.4* 17.9*  INR 4.81* 1.47  CREATININE 0.57 0.73    Estimated Creatinine Clearance: 70.4 mL/min (by C-G formula based on SCr of 0.73 mg/dL).   Medical History: Past Medical History:  Diagnosis Date  . Complication of anesthesia    various issues with oxygen  saturations post op  . COPD (chronic obstructive pulmonary disease) ([place])   . Depression   . Diverticulitis   . DVT (deep venous thrombosis) ([place])   . Fibromyalgia   . GERD (gastroesophageal reflux disease)    . Hiatal hernia   . History of deviated nasal septum    left- side  . HTN (hypertension)   . Hyperlipidemia   . Obesity   . On supplemental oxygen therapy    concentrator at night @ 1.5 l/m or when sleeps. O2 Sat niormally 87.  . Osteoporosis   . PAT (paroxysmal atrial tachycardia) ([place])   . PFO (patent foramen ovale)   . PULMONARY NODULE, LEFT LOWER LOBE 10/14/2009   51mm LLL nodule dec 2010. Stable and 26mm in Oct 2012. No further fu  . Right middle lobe pneumonia ([place]) 07/24/2011   First noted at admit 07/10/11. Persists on cxr 07/22/11. Cleared on CT 08/24/11. No further followup  . Stroke ([place])   . TOBACCO ABUSE 06/04/2009    Medications:  Scheduled:  . desvenlafaxine  50 mg Oral Daily  . gabapentin  300 mg Oral QPM  . HYDROcodone-acetaminophen  1 tablet Oral BID  . metoprolol succinate  25 mg Oral Daily  . montelukast  10 mg Oral QHS  . oxybutynin  10 mg Oral q morning - 10a  . pantoprazole  40 mg Oral q morning - 10a  . potassium chloride SA  40 mEq Oral Once  . sodium chloride flush  3 mL Intravenous Q12H  . traZODone  100 mg Oral QHS  . valACYclovir  1,000 mg Oral BID  . [START ON 08/05/2016] Warfarin - Pharmacist Dosing Inpatient   Does not apply q1800   Infusions:    Assessment:  70 yr female admitted on 10/2 with epistaxis in the setting of coumadin use.  Patient on coumadin for AFib and home regimen = 5mg  daily except 2.5mg  on Tues/Thur/Sat with last dose taken on 10/2 @ 0300.  INR upon admission = 4.81.  Patient received Vit K 2.5mg  sq x 1 @ 19:43 on 10/2 followed by Vit K 5mg  IV x 1 @ 20:53 on 10/2  Patient's nose currently packed and if no further bleeding noted, pharmacy has been consulted to resume coumadin dosing tomorrow (10/4).  Goal of Therapy:  INR 2-3   Plan:  Order daily PT/INR Plan to resume Coumadin on 10/4  Xavius Spadafore, Toribio Harbour, PharmD 08/04/2016,5:36 PM

## 2016-08-04 NOTE — Progress Notes (Signed)
Patient ID: Laurie Horton, female   DOB: 1946/07/19, 70 y.o.   MRN: [id number]     PROGRESS NOTE    SHANEAN DILLEN  [id number] DOB: 1946-06-04 DOA: 08/03/2016  PCP: [name], MD   Brief Narrative:  70 y.o. female with medical history significant of PAF on coumadin, COPD on chronic 1.5L O2 at baseline.  Patient was started on amoxicillin last week for presumed UTI (actually ended up being HSV, also started on valtrex at same time), developed nose bleed 5 days ago which has been constant and persistent for past 5 days.  Not had INR checked in several weeks.  Assessment & Plan:   Principal Problem:   Epistaxis - in the setting of coumadin use - can resume Coumadin in AM  - discussed with Dr. Constance Holster on call, he has recommended to keep packing in place for 5 days and follow up in his clinic by this Friday - if no further bleeding, resume coumadin in AM, possible d/c in AM  Active Problems:   COPD (chronic obstructive pulmonary disease) ([place]) - no signs of respiratory distress, oxygen stable this AM     PAF (paroxysmal atrial fibrillation) (HCC) - on Coumadin, plan to resume in AM    Hypokalemia - supplemented, WNL this AM   DVT prophylaxis: SCD's Code Status: Full  Family Communication: Patient at bedside  Disposition Plan: Home in AM  Consultants:   ENT, Dr. Constance Holster over the phone   Procedures:   Nasal packing in ED  Antimicrobials:   None   Subjective: No events overnight.   Objective: Vitals:   08/04/16 0739 08/04/16 0742 08/04/16 0807 08/04/16 1309  BP:  (!) 147/112  113/85  Pulse:  98  78  Resp:    18  Temp: 98.1 F (36.7 C)     TempSrc: Oral     SpO2:    93%  Weight:   88.5 kg (195 lb 1.6 oz)   Height:   5\' 4"  (1.626 m)     Intake/Output Summary (Last 24 hours) at 08/04/16 1704 Last data filed at 08/03/16 2209  Gross per 24 hour  Intake             1200 ml  Output                0 ml  Net             1200 ml   Filed Weights   08/04/16 0807  Weight: 88.5 kg (195 lb 1.6 oz)    Examination:  General exam: Appears calm and comfortabl, nasal packing on the right side Respiratory system: Clear to auscultation. Respiratory effort normal. Cardiovascular system: S1 & S2 heard, RRR. No JVD, murmurs, rubs, gallops or clicks. No pedal edema. Gastrointestinal system: Abdomen is nondistended, soft and nontender. No organomegaly or masses felt. Normal bowel sounds heard. Central nervous system: Alert and oriented. No focal neurological deficits.    Data Reviewed: I have personally reviewed following labs and imaging studies  CBC:  Recent Labs Lab 07/31/16 1503 08/03/16 1640 08/04/16 0513  WBC 14.4* 8.3 9.5  NEUTROABS  --  6.4  --   HGB 14.5 10.6* 12.5  HCT 41.0 31.5* 36.4  MCV 91.5 94.3 92.6  PLT  --  214 [id number]   Basic Metabolic Panel:  Recent Labs Lab 08/03/16 1640 08/04/16 0513  NA 137 133*  K 2.7* 4.0  CL 110 100*  CO2 21* 25  GLUCOSE 101* 128*  BUN 14  15  CREATININE 0.57 0.73  CALCIUM 7.0* 9.5   Coagulation Profile:  Recent Labs Lab 08/03/16 1640 08/04/16 0513  INR 4.81* 1.47   Urine analysis:    Component Value Date/Time   COLORURINE AMBER (A) 05/04/2013 1132   APPEARANCEUR CLOUDY (A) 05/04/2013 1132   LABSPEC 1.028 05/04/2013 1132   PHURINE 5.5 05/04/2013 1132   GLUCOSEU 100 (A) 05/04/2013 1132   HGBUR NEGATIVE 05/04/2013 1132   BILIRUBINUR negative 07/31/2016 1400   KETONESUR negative 07/31/2016 1400   KETONESUR 15 (A) 05/04/2013 1132   PROTEINUR negative 07/31/2016 1400   PROTEINUR 100 (A) 05/04/2013 1132   UROBILINOGEN 1.0 07/31/2016 1400   UROBILINOGEN 0.2 05/04/2013 1132   NITRITE Negative 07/31/2016 1400   NITRITE NEGATIVE 05/04/2013 1132   LEUKOCYTESUR Trace (A) 07/31/2016 1400   Recent Results (from the past 240 hour(s))  Herpes simplex virus culture     Status: None   Collection Time: 07/31/16  2:19 PM  Result Value Ref Range Status   Organism ID, Bacteria HERPES  SIMPLEX VIRUS TYPE 2 DETECTED.  Final  Urine culture     Status: None   Collection Time: 07/31/16  2:30 PM  Result Value Ref Range Status   Organism ID, Bacteria   Final    Multiple organisms present,each less than 10,000 CFU/mL. These organisms,commonly found on external and internal genitalia,are considered colonizers. No further testing performed.       Radiology Studies: No results found.    Scheduled Meds: . desvenlafaxine  50 mg Oral Daily  . gabapentin  300 mg Oral QPM  . HYDROcodone-acetaminophen  1 tablet Oral BID  . metoprolol succinate  25 mg Oral Daily  . montelukast  10 mg Oral QHS  . oxybutynin  10 mg Oral q morning - 10a  . pantoprazole  40 mg Oral q morning - 10a  . potassium chloride SA  40 mEq Oral Once  . sodium chloride flush  3 mL Intravenous Q12H  . traZODone  100 mg Oral QHS  . valACYclovir  1,000 mg Oral BID   Continuous Infusions:    LOS: 1 day    Time spent: 20 minutes    Faye Ramsay, MD [facility] Pager [phone]  If 7PM-7AM, please contact night-coverage [email or web address] Password [email or web address] 08/04/2016, 5:04 PM

## 2016-08-05 DIAGNOSIS — J449 Chronic obstructive pulmonary disease, unspecified: Secondary | ICD-10-CM | POA: Diagnosis not present

## 2016-08-05 DIAGNOSIS — R040 Epistaxis: Secondary | ICD-10-CM | POA: Diagnosis not present

## 2016-08-05 DIAGNOSIS — A6000 Herpesviral infection of urogenital system, unspecified: Secondary | ICD-10-CM | POA: Diagnosis not present

## 2016-08-05 DIAGNOSIS — J9611 Chronic respiratory failure with hypoxia: Secondary | ICD-10-CM | POA: Diagnosis not present

## 2016-08-05 DIAGNOSIS — E876 Hypokalemia: Secondary | ICD-10-CM

## 2016-08-05 DIAGNOSIS — I480 Paroxysmal atrial fibrillation: Secondary | ICD-10-CM

## 2016-08-05 LAB — BASIC METABOLIC PANEL
ANION GAP: 9 (ref 5–15)
BUN: 14 mg/dL (ref 6–20)
CO2: 26 mmol/L (ref 22–32)
Calcium: 9.5 mg/dL (ref 8.9–10.3)
Chloride: 96 mmol/L — ABNORMAL LOW (ref 101–111)
Creatinine, Ser: 0.9 mg/dL (ref 0.44–1.00)
GFR calc Af Amer: >60 mL/min (ref >60)
Glucose, Bld: 114 mg/dL — ABNORMAL HIGH (ref 65–99)
POTASSIUM: 3.7 mmol/L (ref 3.5–5.1)
SODIUM: 131 mmol/L — AB (ref 135–145)

## 2016-08-05 LAB — PROTIME-INR
INR: 1.05
PROTHROMBIN TIME: 13.8 s (ref 11.4–15.2)

## 2016-08-05 LAB — CBC
HCT: 34.4 % — ABNORMAL LOW (ref 36.0–46.0)
Hemoglobin: 12.2 g/dL (ref 12.0–15.0)
MCH: 31.6 pg (ref 26.0–34.0)
MCHC: 35.5 g/dL (ref 30.0–36.0)
MCV: 89.1 fL (ref 78.0–100.0)
PLATELETS: 244 10*3/uL (ref 150–400)
RBC: 3.86 MIL/uL — AB (ref 3.87–5.11)
RDW: 12.8 % (ref 11.5–15.5)
WBC: 7.8 10*3/uL (ref 4.0–10.5)

## 2016-08-05 MED ORDER — MECLIZINE HCL 12.5 MG PO TABS
12.5000 mg | ORAL_TABLET | Freq: Three times a day (TID) | ORAL | Status: DC | PRN
  Administered 2016-08-06 (×2): 12.5 mg via ORAL
  Filled 2016-08-05 (×3): qty 1

## 2016-08-05 MED ORDER — AMOXICILLIN-POT CLAVULANATE 500-125 MG PO TABS
1.0000 | ORAL_TABLET | Freq: Two times a day (BID) | ORAL | Status: DC
  Administered 2016-08-05 – 2016-08-06 (×2): 500 mg via ORAL
  Filled 2016-08-05 (×2): qty 1

## 2016-08-05 MED ORDER — METOPROLOL TARTRATE 5 MG/5ML IV SOLN
5.0000 mg | Freq: Once | INTRAVENOUS | Status: DC
  Filled 2016-08-05: qty 5

## 2016-08-05 MED ORDER — FLUCONAZOLE 100 MG PO TABS
100.0000 mg | ORAL_TABLET | Freq: Every day | ORAL | Status: DC
  Administered 2016-08-05 – 2016-08-06 (×2): 100 mg via ORAL
  Filled 2016-08-05 (×2): qty 1

## 2016-08-05 MED ORDER — WARFARIN SODIUM 6 MG PO TABS
6.0000 mg | ORAL_TABLET | Freq: Once | ORAL | Status: AC
  Administered 2016-08-05: 6 mg via ORAL
  Filled 2016-08-05: qty 1

## 2016-08-05 MED ORDER — ONDANSETRON HCL 4 MG/2ML IJ SOLN
4.0000 mg | Freq: Four times a day (QID) | INTRAMUSCULAR | Status: DC | PRN
  Administered 2016-08-05 – 2016-08-06 (×2): 4 mg via INTRAVENOUS
  Filled 2016-08-05 (×3): qty 2

## 2016-08-05 NOTE — Progress Notes (Signed)
Patient ID: Laurie Horton, female   DOB: 1945/11/19, 70 y.o.   MRN: [id number]     PROGRESS NOTE    HAYLEIGH LINER  [id number] DOB: 1946/08/19 DOA: 08/03/2016  PCP: [name], MD   Brief Narrative:  70 y.o. female with medical history significant of PAF on coumadin, COPD on chronic 1.5L O2 at baseline.  Patient was started on amoxicillin last week for presumed UTI (actually ended up being HSV, also started on valtrex at same time), developed nose bleed 5 days ago which has been constant and persistent for past 5 days.  Not had INR checked in several weeks.  Assessment & Plan:   Principal Problem:   Epistaxis - in the setting of coumadin use and supratherapeutic INR on admission - INR is now around 1.05; will resume coumadin - discussed with Dr. Constance Holster on call, he has recommended to keep packing in place for 5 days and follow up in his clinic by Friday (08/07/16) - if no further bleeding and able to keep by mouth will discharge home in am -augmentin for prophylaxis     Chronic resp failure with hypoxia due to COPD (chronic obstructive pulmonary disease) ([place]) - no signs of respiratory distress, oxygen stable overall  -continue home medication regimen and oxygen supplementation     PAF (paroxysmal atrial fibrillation) (HCC) - on Coumadin and metoprolol -CHADsVASC score 3-4 -will need close follow up of her INR given antibiotics and antifungal use     Hypokalemia - supplemented, WNL currently       Nausea/vomiting/dizziness  -will use PRN meclizine    Fibromyalgia -will continue neurotnin and PRN tylenol   DVT prophylaxis: SCD's Code Status: Full  Family Communication: Patient at bedside  Disposition Plan: Home in AM, if not having nausea/vomiting   Consultants:   ENT, Dr. Constance Holster over the phone (recommend keeping pack in place and to follow with him in his office on 08/07/16)  Procedures:   Nasal packing in ED  Antimicrobials:   Augmentin 10/4  Valtrex      Subjective: Patient with active nausea, vomiting and feeling dizzy episode. She endorses some upper extremities pain and not feeling good.  Objective: Vitals:   08/04/16 2238 08/05/16 0632 08/05/16 1026 08/05/16 1511  BP: 109/74 112/62 104/69 108/73  Pulse: (!) 103 84 (!) 108 89  Resp:  18  18  Temp: 97.5 F (36.4 C) 98 F (36.7 C)  97.3 F (36.3 C)  TempSrc: Oral Oral    SpO2: 97% 94%  91%  Weight:      Height:        Intake/Output Summary (Last 24 hours) at 08/05/16 1635 Last data filed at 08/05/16 0900  Gross per 24 hour  Intake              240 ml  Output                0 ml  Net              240 ml   Filed Weights   08/04/16 0807  Weight: 88.5 kg (195 lb 1.6 oz)    Examination:  General exam: Appears calm and in no acute distress. Patient with nausea/vomiting and dizziness sensation. Nasal packing on the right side in place, reports some discomfort, no active nasal bleeding appreciated. Patient with intermittent episode of a. Fib Respiratory system: Clear to auscultation. Respiratory effort normal. Cardiovascular system: S1 & S2 heard, Rate controlled. No JVD, murmurs, rubs, gallops or  clicks. No pedal edema. Gastrointestinal system: Abdomen is nondistended, soft and nontender. No organomegaly or masses felt. Normal bowel sounds heard. Central nervous system: Alert and oriented. No focal neurological deficits.  Data Reviewed: I have personally reviewed following labs and imaging studies  CBC:  Recent Labs Lab 07/31/16 1503 08/03/16 1640 08/04/16 0513 08/05/16 0537  WBC 14.4* 8.3 9.5 7.8  NEUTROABS  --  6.4  --   --   HGB 14.5 10.6* 12.5 12.2  HCT 41.0 31.5* 36.4 34.4*  MCV 91.5 94.3 92.6 89.1  PLT  --  214 276 [id number]   Basic Metabolic Panel:  Recent Labs Lab 08/03/16 1640 08/04/16 0513 08/05/16 0537  NA 137 133* 131*  K 2.7* 4.0 3.7  CL 110 100* 96*  CO2 21* 25 26  GLUCOSE 101* 128* 114*  BUN 14 15 14   CREATININE 0.57 0.73 0.90  CALCIUM  7.0* 9.5 9.5   Coagulation Profile:  Recent Labs Lab 08/03/16 1640 08/04/16 0513 08/05/16 0537  INR 4.81* 1.47 1.05   Urine analysis:    Component Value Date/Time   COLORURINE AMBER (A) 05/04/2013 1132   APPEARANCEUR CLOUDY (A) 05/04/2013 1132   LABSPEC 1.028 05/04/2013 1132   PHURINE 5.5 05/04/2013 1132   GLUCOSEU 100 (A) 05/04/2013 1132   HGBUR NEGATIVE 05/04/2013 1132   BILIRUBINUR negative 07/31/2016 1400   KETONESUR negative 07/31/2016 1400   KETONESUR 15 (A) 05/04/2013 1132   PROTEINUR negative 07/31/2016 1400   PROTEINUR 100 (A) 05/04/2013 1132   UROBILINOGEN 1.0 07/31/2016 1400   UROBILINOGEN 0.2 05/04/2013 1132   NITRITE Negative 07/31/2016 1400   NITRITE NEGATIVE 05/04/2013 1132   LEUKOCYTESUR Trace (A) 07/31/2016 1400   Recent Results (from the past 240 hour(s))  Herpes simplex virus culture     Status: None   Collection Time: 07/31/16  2:19 PM  Result Value Ref Range Status   Organism ID, Bacteria HERPES SIMPLEX VIRUS TYPE 2 DETECTED.  Final  Urine culture     Status: None   Collection Time: 07/31/16  2:30 PM  Result Value Ref Range Status   Organism ID, Bacteria   Final    Multiple organisms present,each less than 10,000 CFU/mL. These organisms,commonly found on external and internal genitalia,are considered colonizers. No further testing performed.       Radiology Studies: No results found.    Scheduled Meds: . amoxicillin-clavulanate  1 tablet Oral Q12H  . desvenlafaxine  50 mg Oral Daily  . fluconazole  100 mg Oral Daily  . gabapentin  300 mg Oral QPM  . HYDROcodone-acetaminophen  1 tablet Oral BID  . metoprolol  5 mg Intravenous Once  . montelukast  10 mg Oral QHS  . oxybutynin  10 mg Oral q morning - 10a  . pantoprazole  40 mg Oral q morning - 10a  . potassium chloride SA  40 mEq Oral Once  . sodium chloride flush  3 mL Intravenous Q12H  . traZODone  100 mg Oral QHS  . valACYclovir  1,000 mg Oral BID  . Warfarin - Pharmacist Dosing  Inpatient   Does not apply q1800   Continuous Infusions:    LOS: 1 day    Time spent: 25 minutes    Barton Dubois, MD [facility] Pager [phone]  If 7PM-7AM, please contact night-coverage [email or web address] Password [email or web address] 08/05/2016, 4:35 PM

## 2016-08-05 NOTE — Progress Notes (Signed)
NT notified RN that patient's 02 sats were 70% and that patient had not been wearing her 02.  Patient is on chronic 1.5L of 02 at home. Patient noted to have removed 02 several times today d/t stated discomfort from having to wear it in her mouth d/t nasal packing.  Patient's sats are currently 92%.  Patient states she is currently a patient of Dr. Vevelyn Royals and that he is aware her 02 levels at home are usually 87%.  Will notify Dr. Dyann Kief.

## 2016-08-05 NOTE — Progress Notes (Signed)
This AM patient suddenly had an episode of dizziness while using the bathroom.  NT and RN got patient back to her bed where she stated she felt nauseous and then proceeded to have one episode of emesis.  Shortly after this she converted to A. Fib, with a rate that started in the 130s and went down to the low 100s.  Administered patient zofran and notifed Dr. Dyann Kief.  Patient converted back to sinus rhythm on her own this afternoon.  Patient did say she could feel her heart racing and usually can feel this when she is in an a. Fib rhythm.  BP was 104/69.

## 2016-08-05 NOTE — Progress Notes (Signed)
ANTICOAGULATION CONSULT NOTE - follow up  Pharmacy Consult for Warfarin Indication: atrial fibrillation  Allergies  Allergen Reactions  . Alprazolam     REACTION: stops breathing  . Pseudoephedrine Hcl Er Shortness Of Breath  . Budesonide-Formoterol Fumarate     bliusters  . Aciphex [Rabeprazole Sodium]     rash  . Avelox [Moxifloxacin Hcl In Nacl]     Stomach cramps.   . Esomeprazole Magnesium     REACTION: "bouncing off walls"  . Flonase [Fluticasone Propionate]   . Iodine     REACTION: swelling in throat  . Loratadine     claritin D causes shaking  . Lotrimin [Clotrimazole] Other (See Comments)    blisters  . Lunesta [Eszopiclone]     REACTION: "slept for a week"  . Other     Glue from ekg/heart monitor leads --rash +  Any MYCINS  . Oxcarbazepine Other (See Comments)    Causes deep sleep and dizziness  . Statins Other (See Comments)  . Zolpidem Tartrate     REACTION: "slept for a week"  . Bextra [Valdecoxib] Rash  . Ceclor [Cefaclor] Rash  . Covera-Hs [Verapamil Hcl] Palpitations  . Dicyclomine Hcl Rash  . Tessalon Perles Rash    Patient Measurements: Height: 5\' 4"  (162.6 cm) Weight: 195 lb 1.6 oz (88.5 kg) IBW/kg (Calculated) : 54.7  Vital Signs: Temp: 98 F (36.7 C) (10/04 0632) Temp Source: Oral (10/04 [id number]) BP: 112/62 (10/04 [id number]) Pulse Rate: 84 (10/04 0632)  Labs:  Recent Labs  08/03/16 1640 08/04/16 0513 08/05/16 0537  HGB 10.6* 12.5 12.2  HCT 31.5* 36.4 34.4*  PLT 214 276 244  LABPROT 46.4* 17.9* 13.8  INR 4.81* 1.47 1.05  CREATININE 0.57 0.73 0.90    Estimated Creatinine Clearance: 62.6 mL/min (by C-G formula based on SCr of 0.9 mg/dL).   Medical History: Past Medical History:  Diagnosis Date  . Complication of anesthesia    various issues with oxygen  saturations post op  . COPD (chronic obstructive pulmonary disease) ([place])   . Depression   . Diverticulitis   . DVT (deep venous thrombosis) ([place])   . Fibromyalgia   . GERD  (gastroesophageal reflux disease)   . Hiatal hernia   . History of deviated nasal septum    left- side  . HTN (hypertension)   . Hyperlipidemia   . Obesity   . On supplemental oxygen therapy    concentrator at night @ 1.5 l/m or when sleeps. O2 Sat niormally 87.  . Osteoporosis   . PAT (paroxysmal atrial tachycardia) ([place])   . PFO (patent foramen ovale)   . PULMONARY NODULE, LEFT LOWER LOBE 10/14/2009   6mm LLL nodule dec 2010. Stable and 76mm in Oct 2012. No further fu  . Right middle lobe pneumonia ([place]) 07/24/2011   First noted at admit 07/10/11. Persists on cxr 07/22/11. Cleared on CT 08/24/11. No further followup  . Stroke ([place])   . TOBACCO ABUSE 06/04/2009    Medications:  Scheduled:  . desvenlafaxine  50 mg Oral Daily  . gabapentin  300 mg Oral QPM  . HYDROcodone-acetaminophen  1 tablet Oral BID  . metoprolol succinate  25 mg Oral Daily  . montelukast  10 mg Oral QHS  . oxybutynin  10 mg Oral q morning - 10a  . pantoprazole  40 mg Oral q morning - 10a  . potassium chloride SA  40 mEq Oral Once  . sodium chloride flush  3 mL Intravenous Q12H  . traZODone  100 mg Oral QHS  . valACYclovir  1,000 mg Oral BID  . warfarin  6 mg Oral Once  . Warfarin - Pharmacist Dosing Inpatient   Does not apply q1800   Infusions:    Assessment:  70 yr female admitted on 10/2 with epistaxis in the setting of coumadin use.  Patient on coumadin for AFib and home regimen = 5mg  daily except 2.5mg  on Tues/Thur/Sat with last dose taken on 10/2 @ 0300.  INR upon admission = 4.81.  Patient received Vit K 2.5mg  sq x 1 @ 19:43 on 10/2 followed by Vit K 5mg  IV x 1 @ 20:53 on 10/2  Patient's nose currently packed and no further bleeding noted, pharmacy has been consulted to resume coumadin dosing.   INR 1.05  Goal of Therapy:  INR 2-3   Plan:   Warfarin 6mg  po x 1 today  Resume home warfarin regiment tomorrow at home and follow up with anticoagulation clinic with in 2-3 days.  Daily INR while  inpatient  Dolly Rias RPh 08/05/2016, 11:34 AM Pager [phone]

## 2016-08-06 DIAGNOSIS — A6000 Herpesviral infection of urogenital system, unspecified: Secondary | ICD-10-CM | POA: Diagnosis not present

## 2016-08-06 DIAGNOSIS — D62 Acute posthemorrhagic anemia: Secondary | ICD-10-CM | POA: Diagnosis not present

## 2016-08-06 DIAGNOSIS — J9611 Chronic respiratory failure with hypoxia: Secondary | ICD-10-CM | POA: Diagnosis not present

## 2016-08-06 DIAGNOSIS — R040 Epistaxis: Secondary | ICD-10-CM | POA: Diagnosis not present

## 2016-08-06 DIAGNOSIS — B3731 Acute candidiasis of vulva and vagina: Secondary | ICD-10-CM

## 2016-08-06 DIAGNOSIS — E876 Hypokalemia: Secondary | ICD-10-CM

## 2016-08-06 DIAGNOSIS — F329 Major depressive disorder, single episode, unspecified: Secondary | ICD-10-CM | POA: Diagnosis not present

## 2016-08-06 DIAGNOSIS — B373 Candidiasis of vulva and vagina: Secondary | ICD-10-CM

## 2016-08-06 DIAGNOSIS — J449 Chronic obstructive pulmonary disease, unspecified: Secondary | ICD-10-CM | POA: Diagnosis not present

## 2016-08-06 LAB — PROTIME-INR
INR: 1.05
PROTHROMBIN TIME: 13.8 s (ref 11.4–15.2)

## 2016-08-06 LAB — TROPONIN I: Troponin I: <0.03 ng/mL (ref <0.03)

## 2016-08-06 MED ORDER — GI COCKTAIL ~~LOC~~
30.0000 mL | Freq: Once | ORAL | Status: AC
  Administered 2016-08-06: 30 mL via ORAL
  Filled 2016-08-06: qty 30

## 2016-08-06 MED ORDER — WARFARIN SODIUM 6 MG PO TABS
6.0000 mg | ORAL_TABLET | Freq: Once | ORAL | Status: AC
  Administered 2016-08-06: 6 mg via ORAL
  Filled 2016-08-06 (×2): qty 1

## 2016-08-06 MED ORDER — MECLIZINE HCL 12.5 MG PO TABS: 12.5000 mg | ORAL_TABLET | Freq: Three times a day (TID) | ORAL | 0 refills | Status: DC | PRN

## 2016-08-06 MED ORDER — AMOXICILLIN-POT CLAVULANATE 500-125 MG PO TABS: 1.0000 | ORAL_TABLET | Freq: Two times a day (BID) | ORAL | 0 refills | Status: AC

## 2016-08-06 MED ORDER — FLUCONAZOLE 100 MG PO TABS: 100.0000 mg | ORAL_TABLET | Freq: Every day | ORAL | 0 refills | Status: AC

## 2016-08-06 NOTE — Progress Notes (Signed)
ANTICOAGULATION CONSULT NOTE - follow up  Pharmacy Consult for Warfarin Indication: atrial fibrillation  Allergies  Allergen Reactions  . Alprazolam     REACTION: stops breathing  . Pseudoephedrine Hcl Er Shortness Of Breath  . Budesonide-Formoterol Fumarate     bliusters  . Aciphex [Rabeprazole Sodium]     rash  . Avelox [Moxifloxacin Hcl In Nacl]     Stomach cramps.   . Esomeprazole Magnesium     REACTION: "bouncing off walls"  . Flonase [Fluticasone Propionate]   . Iodine     REACTION: swelling in throat  . Loratadine     claritin D causes shaking  . Lotrimin [Clotrimazole] Other (See Comments)    blisters  . Lunesta [Eszopiclone]     REACTION: "slept for a week"  . Other     Glue from ekg/heart monitor leads --rash +  Any MYCINS  . Oxcarbazepine Other (See Comments)    Causes deep sleep and dizziness  . Statins Other (See Comments)  . Zolpidem Tartrate     REACTION: "slept for a week"  . Bextra [Valdecoxib] Rash  . Ceclor [Cefaclor] Rash  . Covera-Hs [Verapamil Hcl] Palpitations  . Dicyclomine Hcl Rash  . Tessalon Perles Rash    Patient Measurements: Height: 5\' 4"  (162.6 cm) Weight: 195 lb 1.6 oz (88.5 kg) IBW/kg (Calculated) : 54.7  Vital Signs: Temp: 97.5 F (36.4 C) (10/05 0536) Temp Source: Oral (10/05 0536) BP: 126/84 (10/05 0536) Pulse Rate: 93 (10/05 0536)  Labs:  Recent Labs  08/03/16 1640 08/04/16 0513 08/05/16 0537 08/06/16 0522  HGB 10.6* 12.5 12.2  --   HCT 31.5* 36.4 34.4*  --   PLT 214 276 244  --   LABPROT 46.4* 17.9* 13.8 13.8  INR 4.81* 1.47 1.05 1.05  CREATININE 0.57 0.73 0.90  --     Estimated Creatinine Clearance: 62.6 mL/min (by C-G formula based on SCr of 0.9 mg/dL).  Medications:  Scheduled:  . amoxicillin-clavulanate  1 tablet Oral Q12H  . desvenlafaxine  50 mg Oral Daily  . fluconazole  100 mg Oral Daily  . gabapentin  300 mg Oral QPM  . HYDROcodone-acetaminophen  1 tablet Oral BID  . metoprolol  5 mg  Intravenous Once  . montelukast  10 mg Oral QHS  . oxybutynin  10 mg Oral q morning - 10a  . pantoprazole  40 mg Oral q morning - 10a  . potassium chloride SA  40 mEq Oral Once  . sodium chloride flush  3 mL Intravenous Q12H  . traZODone  100 mg Oral QHS  . valACYclovir  1,000 mg Oral BID  . Warfarin - Pharmacist Dosing Inpatient   Does not apply q1800    Assessment: 70 yr female admitted on 10/2 with epistaxis in the setting of coumadin use.  Patient on coumadin for AFib and home regimen = 5mg  daily except 2.5mg  on Tues/Thur/Sat with last dose taken on 10/2 @ 0300.  INR upon admission = 4.81.  Patient received Vit K 2.5mg  sq x 1 @ 19:43 on 10/2 followed by Vit K 5mg  IV x 1 @ 20:53 on 10/2  Patient's nose was packed and no further bleeding noted, pharmacy has been consulted to resume coumadin dosing on 10/4.    Today, 08/06/2016:  INR 1.05, remains lows and subtherapeutic  CBC: Hgb 12.2, Plt 244 (10/4)  Diet: heart healthy, eating 100% meals  No major drug-drug interactions noted.  Vit K given 10/2 may suppress INR for several days.  Goal  of Therapy:  INR 2-3   Plan:   Warfarin 6mg  po x 1 today  For discharge, recommend to resume home warfarin regiment and follow up with anticoagulation clinic by Monday, 10/9  Daily INR while inpatient   Gretta Arab PharmD, BCPS Pager [phone] 08/06/2016 7:10 AM

## 2016-08-06 NOTE — Evaluation (Addendum)
Physical Therapy Evaluation Patient Details Name: Laurie Horton MRN: [id number] DOB: 1946/07/08 Today's Date: 08/06/2016   History of Present Illness  PT admit with nose bleed , Epistaxis, for a few days.  Pt with h/o a-fib, fibromyalgia, COPD, home O2, OA of spine with degenerative discs, days.   Clinical Impression  Pt here with epitaxies and reports of feeling weak. Pt is weak during assessment, however very challenging to get clear picture of patient's baseline as well from the patient's interview. Pt presented with generalized weakness and reported spots and general dizziness at times, however BP WNL and no nystagmus noted with head movements. Pt did have an increase in HR to 150 bpm with short walk but returned immediately to 90-100 within 30 seconds. Nurse and MD notified.  Educated pt to focus and move slow with movements to steady herself once standing. Pt did not use an assistive device during our session for she stated she does not use one at home, however recommend on for I feel it will be helpful for patient at home to begin to use with HHPT to regain strength or steady self when feeling bad.  Do feel patient will benefit from further PT sessions to increase strength and safety at home.     Follow Up Recommendations Home health PT (and HHOT and aide if able )    Equipment Recommendations  Rolling walker with 5" wheels (feel pt would benefit from RW if she would begin to use it with her HHPT. )    Recommendations for Other Services       Precautions / Restrictions        Mobility  Bed Mobility Overal bed mobility: Modified Independent                Transfers Overall transfer level: Needs assistance Equipment used: 1 person hand held assist Transfers: Sit to/from Stand Sit to Stand: Min guard         General transfer comment: min guard to assist due to pateint reported dizziness , but not evident with standing or moving, BPS WNL, and no nystyagmus seen    Ambulation/Gait Ambulation/Gait assistance: Min assist Ambulation Distance (Feet): 25 Feet Assistive device: 1 person hand held assist Gait Pattern/deviations: Step-through pattern     General Gait Details: slow gait, pt required HHA, but did not use RW at this time due to she had to sit to rest 2x after 5 feet, then did walk into hallway 20 feet , very slow and guarded due to weka feeling. No LOB or unsteadiness noted though.   Stairs            Wheelchair Mobility    Modified Rankin (Stroke Patients Only)       Balance Overall balance assessment: No apparent balance deficits (not formally assessed)                                           Pertinent Vitals/Pain Pain Assessment:  (very variable, brief mention of right  trapeziod pain, but goes away, then nose pain and goes away, and back pain "constant" per pt and BP cuff hurts becasue of her fibromyalgia)    Home Living Family/patient expects to be discharged to:: Private residence Living Arrangements: Alone Available Help at Discharge: Neighbor Type of Home: Apartment Home Access: Stairs to enter Entrance Stairs-Rails: Right Entrance Stairs-Number of Steps: 5 Home Layout:  Two level;Bed/bath upstairs;1/2 bath on main level Home Equipment: Cane - single point      Prior Function Level of Independence: Independent         Comments: pt states she is independent and drives, but then stated she doesn't get out much (challenging interview with pateint)     Hand Dominance        Extremity/Trunk Assessment               Lower Extremity Assessment: Generalized weakness         Communication   Communication: No difficulties  Cognition Arousal/Alertness: Awake/alert Behavior During Therapy: WFL for tasks assessed/performed Overall Cognitive Status: Within Functional Limits for tasks assessed                      General Comments      Exercises     Assessment/Plan     PT Assessment Patient needs continued PT services  PT Problem List Decreased strength;Decreased activity tolerance;Decreased mobility;Cardiopulmonary status limiting activity          PT Treatment Interventions Gait training;DME instruction;Stair training;Functional mobility training;Therapeutic activities;Therapeutic exercise;Patient/family education    PT Goals (Current goals can be found in the Care Plan section)  Acute Rehab PT Goals Patient Stated Goal: to get stronger  PT Goal Formulation: With patient Time For Goal Achievement: 08/20/16 Potential to Achieve Goals: Good    Frequency Min 3X/week   Barriers to discharge Decreased caregiver support (unclear of patient's baseline and who is able to help pateint at home )      Co-evaluation               End of Session Equipment Utilized During Treatment: Gait belt Activity Tolerance: Patient limited by fatigue (limited also due to pateint stated her eyes, I can't focus, however stated she wears glasses and they are at home. ) Patient left: in bed Nurse Communication: Mobility status (encourage nursing to walk in room with pateint hand held or with RW. )    Functional Assessment Tool Used: clinical judgement Functional Limitation: Mobility: Walking and moving around Mobility: Walking and Moving Around Current Status (Z6109): At least 1 percent but less than 20 percent impaired, limited or restricted Mobility: Walking and Moving Around Goal Status [phone]): 0 percent impaired, limited or restricted    Time: 0981-1914 PT Time Calculation (min) (ACUTE ONLY): 35 min   Charges:   PT Evaluation $PT Eval Moderate Complexity: 1 Procedure PT Treatments $Therapeutic Activity: 8-22 mins   PT G Codes:   PT G-Codes **NOT FOR INPATIENT CLASS** Functional Assessment Tool Used: clinical judgement Functional Limitation: Mobility: Walking and moving around Mobility: Walking and Moving Around Current Status (N8295): At least 1  percent but less than 20 percent impaired, limited or restricted Mobility: Walking and Moving Around Goal Status [phone]): 0 percent impaired, limited or restricted    Laurie Horton 08/06/2016, 6:15 PM  Laurie Horton, PT Pager: [phone] 08/06/2016

## 2016-08-06 NOTE — Progress Notes (Signed)
Completed D/C teaching with patient.Answered all questions. Patient will be D/C home in stable condition with friends.

## 2016-08-06 NOTE — Care Management Note (Signed)
Case Management Note  Patient Details  Name: Laurie Horton MRN: [id number] Date of Birth: Mar 02, 1946  Subjective/Objective:  Patient had rapid response earlier today.  Received call from MD going to d/c home. PT not able to see patient until tomorrw, MD feels patient is medically stable for d/c.Recc-HHRN-safety eval.Patient's pcp-Kristi Smith-has cane, home 02-[facility] @ home. PT evaluating now. [facility] chosen, rep Manuela Schwartz aware.               Action/Plan:d/c home w/[facility]HC.   Expected Discharge Date:   (unknown)               Expected Discharge Plan:  [place]  In-House Referral:     Discharge planning Services  CM Consult  Post Acute Care Choice:    Choice offered to:     DME Arranged:    DME Agency:     HH Arranged:  RN [place] Agency:     Status of Service:  Completed, signed off  If discussed at [employer] of Stay Meetings, dates discussed:    Additional Comments:  Dessa Phi, RN 08/06/2016, 4:20 PM

## 2016-08-06 NOTE — Discharge Summary (Signed)
Physician Discharge Summary  Laurie Horton [id number] DOB: Jan 11, 1946 DOA: 08/03/2016  PCP: Laurie Forts, MD  Admit date: 08/03/2016 Discharge date: 08/06/2016  Time spent: 35 minutes  Recommendations for Outpatient Follow-up:  1. Repeat BMET to follow electrolytes and renal function  2. Repeat CBC to follow Hgb  3. Reassess vaginal erythema/itching symptoms. She might have some atrophy ongoing   Discharge Diagnoses:  Principal Problem:   Epistaxis Active Problems:   COPD (chronic obstructive pulmonary disease) (HCC)   Chronic respiratory failure with hypoxia (HCC)   PAF (paroxysmal atrial fibrillation) (HCC)   Genital herpes   Acute blood loss anemia   Hypokalemia   Vaginal candida Depression   Discharge Condition: stable and improved. Will discharge home with HHRN, HHPT, [place] for safety evaluation and to facilitate transition of care. Follow up with Dr. [name] (ENT) for further treatment and management on her epistaxis. Follow up on Monday (08/10/16) with coumadin clinic.  Diet recommendation: heart healthy diet   Filed Weights   08/04/16 0807  Weight: 88.5 kg (195 lb 1.6 oz)    History of present illness:  As per H&P written by Dr. [name] on 08/03/16 70 y.o. female with medical history significant of PAF on coumadin, COPD on chronic 1.5L O2 at baseline.  Patient was started on amoxicillin last week for presumed UTI (actually ended up being HSV, also started on valtrex at same time), developed nose bleed 5 days ago which has been constant and persistent for past 5 days.  Not had INR checked in several weeks.  ED Course: INR 4.8, HGB 10 down from 14 on wed last week.  Anterior packing put in place.  Patient O2 requirement increased due to not being able to breathing through her bloody nose.  Hospital Course:   Epistaxis - in the setting of coumadin use and supratherapeutic INR on admission - INR is now around 1.05; will resume coumadin and follow closely to prevent  supratherapeutic level - discussed with Dr. [name] on [id number], he has recommended to keep packing in place for 5 days and follow up in his clinic by Friday  -augmentin for prophylaxis     Chronic resp failure with hypoxia due to COPD (chronic obstructive pulmonary disease) ([place]) - no signs of respiratory distress, oxygen stable overall  -continue home medication regimen and oxygen supplementation  -had follow up with her pulmonologist (Dr. [name])  already schedule    PAF (paroxysmal atrial fibrillation) (HCC) - on Coumadin and metoprolol -CHADsVASC score 3-4 -will need close follow up of her INR given antibiotics and antifungal use     Hypokalemia - supplemented, WNL currently       Nausea/vomiting/dizziness  -will use PRN meclizine -no further nausea or vomiting at discharge -advise to keep herself well hydrated -HHPT, [place] and HHRN ordered for safety evaluation and further assistance/treatment     Fibromyalgia -will continue neurotnin and PRN tylenol    Depression -no SI or hallucinations -will continue home medication regimen     herpes genitalia and vaginal candidiasis  -patient has completed treatment with valtrex as instructed by PCP prior to admission -will complete therapy with diflucan as prescribed -at discharge had significant improvement in her itching and vaginal erythema -important to reassess at follow up for potential component of vaginal atrophy   Procedures:  See below for x-ray reports   Consultations:  ENT  Discharge Exam: Vitals:   08/06/16 1320 08/06/16 1446  BP: 112/84 128/76  Pulse: (!) 102 100  Resp: 18 18  Temp: 97.8 F (36.6 C) 97.7 F (36.5 C)   General exam: Appears calm and in no acute distress. Patient without any further nausea/vomiting; endorses still having some mild dizziness sensation at discharge. Nasal packing on the right side in place, reports some discomfort from it, no active nasal bleeding appreciated.   Respiratory system: Clear to auscultation. Respiratory effort normal. Cardiovascular system: S1 & S2 heard, Rate controlled. No JVD, murmurs, rubs, gallops or clicks. No pedal edema. Gastrointestinal system: Abdomen is nondistended, soft and nontender. No organomegaly or masses felt. Normal bowel sounds heard. Central nervous system: Alert and oriented. No focal neurological deficits.   Discharge Instructions   Discharge Instructions    Diet - low sodium heart healthy    Complete by:  As directed    Discharge instructions    Complete by:  As directed    Take medications as prescribed  Please follow up with Dr. [name] (ENT on [id number]) Keep yourself well hydrated  Arrange follow up with PCP in 1 week Follow up with coumadin clinic on 08/10/16 to follow on your coumadin level     Current Discharge Medication List    START taking these medications   Details  amoxicillin-clavulanate (AUGMENTIN) 500-125 MG tablet Take 1 tablet (500 mg total) by mouth every 12 (twelve) hours. Qty: 8 tablet, Refills: 0    fluconazole (DIFLUCAN) 100 MG tablet Take 1 tablet (100 mg total) by mouth daily. Qty: 2 tablet, Refills: 0    meclizine (ANTIVERT) 12.5 MG tablet Take 1 tablet (12.5 mg total) by mouth 3 (three) times daily as needed for dizziness or nausea. Qty: 30 tablet, Refills: 0      CONTINUE these medications which have NOT CHANGED   Details  gabapentin (NEURONTIN) 300 MG capsule Take 300 mg by mouth every evening.     HYDROcodone-acetaminophen (NORCO/VICODIN) 5-325 MG tablet Take 1 tablet by mouth 2 (two) times daily.     metoprolol succinate (TOPROL XL) 25 MG 24 hr tablet Take 1 tablet (25 mg total) by mouth daily. Qty: 30 tablet, Refills: 6    montelukast (SINGULAIR) 10 MG tablet TAKE 1 TABLET BY MOUTH AT BEDTIME Qty: 90 tablet, Refills: 0    oxybutynin (DITROPAN-XL) 10 MG 24 hr tablet Take 10 mg by mouth every morning. Reported on 02/06/2016 Refills: 3    OXYGEN Inhale 1.5 L  into the lungs. For naps and sleep.    pantoprazole (PROTONIX) 40 MG tablet Take 1 tablet by mouth every morning.     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     Zinc Oxide 12 % CREA Apply 1 application topically 2 (two) times daily. Qty: 198 g, Refills: 1   Associated Diagnoses: Rash of genital area      STOP taking these medications     amoxicillin-clavulanate (AUGMENTIN) 875-125 MG tablet      diazepam (VALIUM) 2 MG tablet  valACYclovir (VALTREX) 1000 MG tablet        Allergies  Allergen Reactions  . Alprazolam     REACTION: stops breathing  . Pseudoephedrine Hcl Er Shortness Of Breath  . Budesonide-Formoterol Fumarate     bliusters  . Aciphex [Rabeprazole Sodium]     rash  . Avelox [Moxifloxacin Hcl In Nacl]     Stomach cramps.   . Esomeprazole Magnesium     REACTION: "bouncing off walls"  . Flonase [Fluticasone Propionate]   . Iodine     REACTION: swelling in throat  . Loratadine     claritin D causes shaking  . Lotrimin [Clotrimazole] Other (See Comments)    blisters  . Lunesta [Eszopiclone]     REACTION: "slept for a week"  . Other     Glue from ekg/heart monitor leads --rash +  Any MYCINS  . Oxcarbazepine Other (See Comments)    Causes deep sleep and dizziness  . Statins Other (See Comments)  . Zolpidem Tartrate     REACTION: "slept for a week"  . Bextra [Valdecoxib] Rash  . Ceclor [Cefaclor] Rash  . Covera-Hs [Verapamil Hcl]  Palpitations  . Dicyclomine Hcl Rash  . Tessalon Perles Rash   Follow-up Information    Jodi Marble, MD. Go on 08/07/2016.   Specialty:  Otolaryngology Why:  At 10:00 am for post hospitalization follow-up, Arrive 15 mins prior to appt, 9:45 Contact information: [address] [place] [address] [phone]        Laurie Forts, MD. Schedule an appointment as soon as possible for a visit in 1 week(s).   Specialty:  Family Medicine Contact information: [place] [id number] [phone]            The results of significant diagnostics from this hospitalization (including imaging, microbiology, ancillary and laboratory) are listed below for reference.    Significant Diagnostic Studies: No results found.  Microbiology: Recent Results (from the past 240 hour(s))  Herpes simplex virus culture     Status: None   Collection Time: 07/31/16  2:19 PM  Result Value Ref Range Status   Organism ID, Bacteria HERPES SIMPLEX VIRUS TYPE 2 DETECTED.  Final  Urine culture     Status: None   Collection Time: 07/31/16  2:30 PM  Result Value Ref Range Status   Organism ID, Bacteria   Final    Multiple organisms present,each less than 10,000 CFU/mL. These organisms,commonly found on external and internal genitalia,are considered colonizers. No further testing performed.      Labs: Basic Metabolic Panel:  Recent Labs Lab 08/03/16 1640 08/04/16 0513 08/05/16 0537  NA 137 133* 131*  K 2.7* 4.0 3.7  CL 110 100* 96*  CO2 21* 25 26  GLUCOSE 101* 128* 114*  BUN 14 15 14   CREATININE 0.57 0.73 0.90  CALCIUM 7.0* 9.5 9.5   Liver Function Tests: No results for input(s): AST, ALT, ALKPHOS, BILITOT, PROT, ALBUMIN in the last 168 hours. No results for input(s): LIPASE, AMYLASE in the last 168 hours. No results for input(s): AMMONIA in the last 168 hours. CBC:  Recent Labs Lab 07/31/16 1503 08/03/16 1640 08/04/16 0513 08/05/16 0537  WBC 14.4*  8.3 9.5 7.8  NEUTROABS  --  6.4  --   --   HGB 14.5 10.6* 12.5 12.2  HCT 41.0 31.5* 36.4 34.4*  MCV 91.5 94.3 92.6 89.1  PLT  --  214 276 244   Cardiac Enzymes:  Recent Labs Lab 08/06/16 1430  TROPONINI <0.03   BNP: BNP (last 3 results) No results for input(s): BNP in the last 8760 hours.  ProBNP (last 3 results) No results for input(s): PROBNP in the last 8760 hours.  CBG: No results for input(s): GLUCAP in the last 168 hours.     Signed:  Barton Dubois MD.  [facility] 08/06/2016, 3:55 PM

## 2016-08-06 NOTE — Progress Notes (Signed)
Pt was getting up with the nursing student and started to complain of chest pain as she set back on the bed. Student nurse notified this RN. Vitals and EKG were obtained and MD made aware. EKG stated patient was having STEMI. Agricultural consultant and rapid response were notified, as well as MD. Patient also complained of SOB and dizziness, but stated pain had subsided. MD compared this EKG to previous EKG and there was no major changes and concluded the patient was not having a STEMI. Troponin was negative and GI Cocktail was given to patient. I reassessed the patient and she was resting comfortably.

## 2016-08-06 NOTE — Significant Event (Signed)
Rapid Response Event Note  Overview: Time Called: 1325 Event Type: Cardiac  Initial Focused Assessment:  Called to see pt d/t code stemi reading on EKG.  Pt lying in bed appears in NAD, skin W&D, color good.  Pt had c/o dizziness and SOB, some chest pain and pain between shoulder blades after getting up to use the bathroom.  Currently has nasal packing in rt nare due to nose bleed and  02 prongs are in her mouth.  Bedside RN said pt did this yesterday, EKG was done then also.    Interventions:Placed on monitor with ST freq PAC's, pt said she can tell when she goes into at fib. 02 sats range from 86-92%. Tried VM but pt unable to tolerate.  Dr Dyann Kief was paged by bedside RN.  Will draw labs and adm GI cocktail.  BP 122/81 HR 104, R 16 SATS 94%.  Plan of Care (if not transferred):  Event Summary:  [id number] to monitor pt on tele floor.  Pt stable at this time.   at      at          [place], Jae Dire

## 2016-08-06 NOTE — Progress Notes (Signed)
PT Cancellation Note  Patient Details Name: Laurie Horton MRN: [id number] DOB: 10-24-1946   Cancelled Treatment:    Reason Eval/Treat Not Completed: Medical issues which prohibited therapy (rapid response called)   [place]ie [place],KATHrine E 08/06/2016, 1:51 PM Carmelia Bake, PT, DPT 08/06/2016 Pager: [phone]

## 2016-08-07 NOTE — Care Management Note (Signed)
Case Management Note  Patient Details  Name: Laurie Horton MRN: [id number] Date of Birth: [birth year]  Subjective/Objective:f/u from yesterday,-PT seen patient recc-HHPT,rw- spoke to [facility] dme rep-they did deliver rw to patient. [facility] rep-aware of HH added orders-HHPT/OT/aide,sw.                    Action/Plan:d/c home yesterday w/HHC/dme   Expected Discharge Date:   (unknown)               Expected Discharge Plan:  [place]  In-House Referral:     Discharge planning Services  CM Consult  Post Acute Care Choice:    Choice offered to:  Patient  DME Arranged:  [place] rolling DME Agency:  [place]:  RN, PT, OT, Nurse's Aide, Social Work [employer] Agency:     Status of Service:  Completed, signed off  If discussed at [employer], dates discussed:    Additional Comments:  Dessa Phi, RN 08/07/2016, 11:06 AM

## 2016-08-10 ENCOUNTER — Telehealth: Payer: Self-pay

## 2016-08-10 ENCOUNTER — Other Ambulatory Visit: Payer: Self-pay | Admitting: Pharmacist Clinician (PhC)/ Clinical Pharmacy Specialist

## 2016-08-10 ENCOUNTER — Ambulatory Visit (INDEPENDENT_AMBULATORY_CARE_PROVIDER_SITE_OTHER): Payer: Medicare Other | Admitting: Pharmacist Clinician (PhC)/ Clinical Pharmacy Specialist

## 2016-08-10 DIAGNOSIS — I480 Paroxysmal atrial fibrillation: Secondary | ICD-10-CM

## 2016-08-10 DIAGNOSIS — Z7901 Long term (current) use of anticoagulants: Secondary | ICD-10-CM

## 2016-08-10 LAB — POCT INR: INR: 2.2

## 2016-08-10 MED ORDER — WARFARIN SODIUM 5 MG PO TABS: ORAL_TABLET | ORAL | 1 refills | Status: DC

## 2016-08-10 NOTE — Telephone Encounter (Signed)
FYI

## 2016-08-10 NOTE — Telephone Encounter (Signed)
BETH FROM [facility] WOULD LIKE DR Tamala Julian TO KNOW THAT MRS [place] HAS REFUSED [place] AND SAYS SHE IS DOING JUST FINE    YOU MAY [place] AT [phone]

## 2016-08-16 ENCOUNTER — Other Ambulatory Visit (HOSPITAL_COMMUNITY): Payer: Self-pay | Admitting: Psychiatry

## 2016-08-16 DIAGNOSIS — F0630 Mood disorder due to known physiological condition, unspecified: Secondary | ICD-10-CM

## 2016-08-17 ENCOUNTER — Ambulatory Visit (INDEPENDENT_AMBULATORY_CARE_PROVIDER_SITE_OTHER): Payer: Medicare Other | Admitting: Pharmacist

## 2016-08-17 DIAGNOSIS — I480 Paroxysmal atrial fibrillation: Secondary | ICD-10-CM

## 2016-08-17 DIAGNOSIS — Z7901 Long term (current) use of anticoagulants: Secondary | ICD-10-CM

## 2016-08-17 LAB — POCT INR: INR: 1.3

## 2016-08-18 ENCOUNTER — Ambulatory Visit (INDEPENDENT_AMBULATORY_CARE_PROVIDER_SITE_OTHER): Payer: Medicare Other | Admitting: Family Medicine

## 2016-08-18 ENCOUNTER — Encounter: Payer: Self-pay | Admitting: Family Medicine

## 2016-08-18 VITALS — BP 116/80 | HR 107 | Temp 98.6°F | Resp 16 | Ht 63.5 in | Wt 198.2 lb

## 2016-08-18 DIAGNOSIS — D508 Other iron deficiency anemias: Secondary | ICD-10-CM | POA: Diagnosis not present

## 2016-08-18 DIAGNOSIS — A6004 Herpesviral vulvovaginitis: Secondary | ICD-10-CM | POA: Diagnosis not present

## 2016-08-18 DIAGNOSIS — I471 Supraventricular tachycardia: Secondary | ICD-10-CM | POA: Diagnosis not present

## 2016-08-18 DIAGNOSIS — I10 Essential (primary) hypertension: Secondary | ICD-10-CM | POA: Diagnosis not present

## 2016-08-18 DIAGNOSIS — W5501XA Bitten by cat, initial encounter: Secondary | ICD-10-CM

## 2016-08-18 DIAGNOSIS — R040 Epistaxis: Secondary | ICD-10-CM | POA: Diagnosis not present

## 2016-08-18 DIAGNOSIS — Z23 Encounter for immunization: Secondary | ICD-10-CM

## 2016-08-18 DIAGNOSIS — B373 Candidiasis of vulva and vagina: Secondary | ICD-10-CM

## 2016-08-18 DIAGNOSIS — B3731 Acute candidiasis of vulva and vagina: Secondary | ICD-10-CM

## 2016-08-18 LAB — COMPREHENSIVE METABOLIC PANEL
ALBUMIN: 3.7 g/dL (ref 3.6–5.1)
ALK PHOS: 58 U/L (ref 33–130)
ALT: 16 U/L (ref 6–29)
AST: 10 U/L (ref 10–35)
BILIRUBIN TOTAL: 0.3 mg/dL (ref 0.2–1.2)
BUN: 11 mg/dL (ref 7–25)
CALCIUM: 9 mg/dL (ref 8.6–10.4)
CO2: 26 mmol/L (ref 20–31)
Chloride: 100 mmol/L (ref 98–110)
Creat: 0.91 mg/dL (ref 0.60–0.93)
Glucose, Bld: 152 mg/dL — ABNORMAL HIGH (ref 65–99)
Potassium: 3.9 mmol/L (ref 3.5–5.3)
Sodium: 137 mmol/L (ref 135–146)
Total Protein: 5.6 g/dL — ABNORMAL LOW (ref 6.1–8.1)

## 2016-08-18 LAB — CBC WITH DIFFERENTIAL/PLATELET
BASOS PCT: 0 %
Basophils Absolute: 0 cells/uL (ref 0–200)
EOS ABS: 67 {cells}/uL (ref 15–500)
Eosinophils Relative: 1 %
HEMATOCRIT: 34 % — AB (ref 35.0–45.0)
HEMOGLOBIN: 11.1 g/dL — AB (ref 11.7–15.5)
LYMPHS ABS: 1340 {cells}/uL (ref 850–3900)
Lymphocytes Relative: 20 %
MCH: 30.7 pg (ref 27.0–33.0)
MCHC: 32.6 g/dL (ref 32.0–36.0)
MCV: 93.9 fL (ref 80.0–100.0)
MONO ABS: 469 {cells}/uL (ref 200–950)
MPV: 9.9 fL (ref 7.5–12.5)
Monocytes Relative: 7 %
NEUTROS PCT: 72 %
Neutro Abs: 4824 cells/uL (ref 1500–7800)
Platelets: 287 10*3/uL (ref 140–400)
RBC: 3.62 MIL/uL — AB (ref 3.80–5.10)
RDW: 14.4 % (ref 11.0–15.0)
WBC: 6.7 10*3/uL (ref 3.8–10.8)

## 2016-08-18 MED ORDER — TERCONAZOLE 0.4 % VA CREA: 1.0000 | TOPICAL_CREAM | Freq: Every day | VAGINAL | 0 refills | Status: DC

## 2016-08-18 NOTE — Patient Instructions (Addendum)
   IF you received an x-ray today, you will receive an invoice from [facility]. Please contact [facility] at [phone] with questions or concerns regarding your invoice.   IF you received labwork today, you will receive an invoice from [facility]. Please contact [facility] at [phone] with questions or concerns regarding your invoice.   Our billing staff will not be able to assist you with questions regarding bills from these companies.  You will be contacted with the lab results as soon as they are available. The fastest way to get your results is to activate your My Chart account. Instructions are located on the last page of this paperwork. If you have not heard from us regarding the results in 2 weeks, please contact this office.    Influenza (Flu) Vaccine (Inactivated or Recombinant):  1. Why get vaccinated? Influenza ("flu") is a contagious disease that spreads around the [place] every year, usually between October and May. Flu is caused by influenza viruses, and is spread mainly by coughing, sneezing, and close contact. Anyone can get flu. Flu strikes suddenly and can last several days. Symptoms vary by age, but can include:  fever/chills  sore throat  muscle aches  fatigue  cough  headache  runny or stuffy nose Flu can also lead to pneumonia and blood infections, and cause diarrhea and seizures in children. If you have a medical condition, such as heart or lung disease, flu can make it worse. Flu is more dangerous for some people. Infants and young children, people 65 years of age and older, pregnant women, and people with certain health conditions or a weakened immune system are at greatest risk. Each year thousands of people in the [place] die from flu, and many more are hospitalized. Flu vaccine can:  keep you from getting flu,  make flu less severe if you do get it, and  keep you from spreading flu to  your family and other people. 2. Inactivated and recombinant flu vaccines A dose of flu vaccine is recommended every flu season. Children 6 months through 8 years of age may need two doses during the same flu season. Everyone else needs only one dose each flu season. Some inactivated flu vaccines contain a very small amount of a mercury-based preservative called thimerosal. Studies have not shown thimerosal in vaccines to be harmful, but flu vaccines that do not contain thimerosal are available. There is no live flu virus in flu shots. They cannot cause the flu. There are many flu viruses, and they are always changing. Each year a new flu vaccine is made to protect against three or four viruses that are likely to cause disease in the upcoming flu season. But even when the vaccine doesn't exactly match these viruses, it may still provide some protection. Flu vaccine cannot prevent:  flu that is caused by a virus not covered by the vaccine, or  illnesses that look like flu but are not. It takes about 2 weeks for protection to develop after vaccination, and protection lasts through the flu season. 3. Some people should not get this vaccine Tell the person who is giving you the vaccine:  If you have any severe, life-threatening allergies. If you ever had a life-threatening allergic reaction after a dose of flu vaccine, or have a severe allergy to any part of this vaccine, you may be advised not to get vaccinated. Most, but not all, types of flu vaccine contain a small amount of egg protein.    If you ever had Guillain-Barre Syndrome (also called GBS). Some people with a history of GBS should not get this vaccine. This should be discussed with your doctor.  If you are not feeling well. It is usually okay to get flu vaccine when you have a mild illness, but you might be asked to come back when you feel better. 4. Risks of a vaccine reaction With any medicine, including vaccines, there is a chance of  reactions. These are usually mild and go away on their own, but serious reactions are also possible. Most people who get a flu shot do not have any problems with it. Minor problems following a flu shot include:  soreness, redness, or swelling where the shot was given  hoarseness  sore, red or itchy eyes  cough  fever  aches  headache  itching  fatigue If these problems occur, they usually begin soon after the shot and last 1 or 2 days. More serious problems following a flu shot can include the following:  There may be a small increased risk of Guillain-Barre Syndrome (GBS) after inactivated flu vaccine. This risk has been estimated at 1 or 2 additional cases per million people vaccinated. This is much lower than the risk of severe complications from flu, which can be prevented by flu vaccine.  Young children who get the flu shot along with pneumococcal vaccine (PCV13) and/or DTaP vaccine at the same time might be slightly more likely to have a seizure caused by fever. Ask your doctor for more information. Tell your doctor if a child who is getting flu vaccine has ever had a seizure. Problems that could happen after any injected vaccine:  People sometimes faint after a medical procedure, including vaccination. Sitting or lying down for about 15 minutes can help prevent fainting, and injuries caused by a fall. Tell your doctor if you feel dizzy, or have vision changes or ringing in the ears.  Some people get severe pain in the shoulder and have difficulty moving the arm where a shot was given. This happens very rarely.  Any medication can cause a severe allergic reaction. Such reactions from a vaccine are very rare, estimated at about 1 in a million doses, and would happen within a few minutes to a few hours after the vaccination. As with any medicine, there is a very remote chance of a vaccine causing a serious injury or death. The safety of vaccines is always being monitored. For  more information, visit: www.cdc.gov/vaccinesafety/ 5. What if there is a serious reaction? What should I look for?  Look for anything that concerns you, such as signs of a severe allergic reaction, very high fever, or unusual behavior. Signs of a severe allergic reaction can include hives, swelling of the face and throat, difficulty breathing, a fast heartbeat, dizziness, and weakness. These would start a few minutes to a few hours after the vaccination. What should I do?  If you think it is a severe allergic reaction or other emergency that can't wait, call 9-1-1 and get the person to the nearest hospital. Otherwise, call your doctor.  Reactions should be reported to the Vaccine Adverse Event Reporting System (VAERS). Your doctor should file this report, or you can do it yourself through the VAERS web site at www.vaers.hhs.gov, or by calling 1-800-822-7967. VAERS does not give medical advice. 6. The National Vaccine Injury Compensation Program The National Vaccine Injury Compensation Program (VICP) is a federal program that was created to compensate people who may have been   injured by certain vaccines. Persons who believe they may have been injured by a vaccine can learn about the program and about filing a claim by calling 1-800-338-2382 or visiting the VICP website at www.hrsa.gov/vaccinecompensation. There is a time limit to file a claim for compensation. 7. How can I learn more?  Ask your healthcare provider. He or she can give you the vaccine package insert or suggest other sources of information.  Call your local or state health department.  Contact the Centers for Disease Control and Prevention (CDC):  Call 1-800-232-4636 (1-800-CDC-INFO) or  Visit CDC's website at www.cdc.gov/flu Vaccine Information Statement Inactivated Influenza Vaccine (06/08/2014)   This information is not intended to replace advice given to you by your health care provider. Make sure you discuss any  questions you have with your health care provider.   Document Released: 08/13/2006 Document Revised: 11/09/2014 Document Reviewed: 06/11/2014 Elsevier Interactive Patient Education 2016 [employer].  

## 2016-08-18 NOTE — Progress Notes (Signed)
Subjective:    Patient ID: Laurie Horton, female    DOB: 03-18-1946, 70 y.o.   MRN: [id number]  08/18/2016  Hypertension   HPI This 70 y.o. female presents to establish care and TRANSITION INTO CARE from recent ED visit for epistaxis.      Two weeks ago, came in for an infection.  Had me strip; then asked if still had uterus; sexually active. 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 Talked about medicine.  Never discussed treatment.  Spitting up clots.  Ordered CBCs.  Nose bleed never went away; does not feel right. Called [facility]; medication ordered not usually prescribed on Coumadin.  Cannot tolerate Crestor; causes leg Crestor.  Took Lipitor for years and then one day unable to walk.  Returned and saw Alwyn Ren; he was almost in tears; lost 400 cc blood due to nosebleeds.  Called ambulance due to epistaxis.  Stopped medication after realizing medication.  EMT made comments about EMS transport for nosebleed. Spent five days in hospital; placed balloon tamponade; never offered tooth paste or tooth brush.  Wiped at bottom; had diarrhea during admission; GI system could not tolerate clotting.  Vomited clots during admission; blood everywhere.  Horrible experience; at one point planed blood transfusion.  At one point called a code.  Decided not to discharge.  INR 5.0 during admission.  INR yesterday 1.3.  Saw Wolicki three days ago; discharged from care; had follow-up appointment.  Must make appointment with Ramaswami.  Suffers with n/v/d with flu vaccine.  Very lethargic; not back to me.  Must sit in chair to vacuum.  Had a recurrent nose bleed this morning yet was able to stop bleeding.  Checks blood pressure daily; 96/60-141/101.  Pulse over 100.  Sees Dr. [place]and next week.    Depression: Pristiq, Trazodone.  Stable at this time.  No current concerns.  Fibromyalgia/DDD lumbar:  Bodea with [facility]; previous saw Dr. Bing Quarry for injections in lumbar spine; Neurontin 300mg   qhs.  Dr. Link Snuffer did "me dirty".  Taking hydrocodone bid.  Bodeo is doing injections; also prescribing hydrocodone.  Sees her tomorrow.  Cat bite: suffered recent cat bite from own cat; Tetanus is not up to date. Denies fever, redness, drainage.  Review of Systems  Constitutional: Negative for chills, diaphoresis, fatigue and fever.  HENT: Positive for nosebleeds.   Eyes: Negative for visual disturbance.  Respiratory: Negative for cough and shortness of breath.   Cardiovascular: Negative for chest pain, palpitations and leg swelling.  Gastrointestinal: Negative for abdominal pain, constipation, diarrhea, nausea and vomiting.  Endocrine: Negative for cold intolerance, heat intolerance, polydipsia, polyphagia and polyuria.  Genitourinary: Positive for vaginal pain. Negative for dysuria, enuresis, flank pain, frequency, pelvic pain, urgency, vaginal bleeding and vaginal discharge.  Neurological: Negative for dizziness, tremors, seizures, syncope, facial asymmetry, speech difficulty, weakness, light-headedness, numbness and headaches.  Psychiatric/Behavioral: Positive for dysphoric mood. The patient is nervous/anxious.     Past Medical History:  Diagnosis Date  . Complication of anesthesia    various issues with oxygen  saturations post op  . COPD (chronic obstructive pulmonary disease) (HCC)   . Depression   . Diverticulitis   . DVT (deep venous thrombosis) (HCC)   . Fibromyalgia   . GERD (gastroesophageal reflux disease)   . Hiatal hernia   . History of deviated nasal septum    left- side  . HTN (hypertension)   . Hyperlipidemia   . Obesity   . On supplemental  oxygen therapy    concentrator at night @ 1.5 l/m or when sleeps. O2 Sat niormally 87.  . Osteoporosis   . PAT (paroxysmal atrial tachycardia) (HCC)   . PFO (patent foramen ovale)   . PULMONARY NODULE, LEFT LOWER LOBE 10/14/2009   5mm LLL nodule dec 2010. Stable and 4mm in Oct 2012. No further fu  . Right middle lobe  pneumonia (HCC) 07/24/2011   First noted at admit 07/10/11. Persists on cxr 07/22/11. Cleared on CT 08/24/11. No further followup  . Stroke (HCC)   . TOBACCO ABUSE 06/04/2009   Past Surgical History:  Procedure Laterality Date  . CARDIOVASCULAR STRESS TEST  12/26/2004   EF 74%. NO EVIDENCE OF ISCHEMIA  . ESOPHAGOGASTRODUODENOSCOPY (EGD) WITH PROPOFOL N/A 04/15/2015   Procedure: ESOPHAGOGASTRODUODENOSCOPY (EGD) WITH PROPOFOL;  Surgeon: Carman Ching, MD;  Location: [facility];  Service: Endoscopy;  Laterality: N/A;  . KNEE ARTHROSCOPY  2000   left  . LAPAROSCOPIC CHOLECYSTECTOMY  04-16-2010   [place]  . MOUTH SURGERY     03-26-15 multiple extractions stitches remains  . TOTAL ABDOMINAL HYSTERECTOMY     post op needed oxygen was told "she gave them a scare"  . TUBAL LIGATION    . US ECHOCARDIOGRAPHY  11/20/2009   EF 55-60%   Allergies  Allergen Reactions  . Alprazolam     REACTION: stops breathing  . Pseudoephedrine Hcl Er Shortness Of Breath  . Budesonide-Formoterol Fumarate     bliusters  . Aciphex [Rabeprazole Sodium]     rash  . Avelox [Moxifloxacin Hcl In Nacl]     Stomach cramps.   . Esomeprazole Magnesium     REACTION: "bouncing off walls"  . Flonase [Fluticasone Propionate]   . Iodine     REACTION: swelling in throat  . Loratadine     claritin D causes shaking  . Lotrimin [Clotrimazole] Other (See Comments)    blisters  . Lunesta [Eszopiclone]     REACTION: "slept for a week"  . Other     Glue from ekg/heart monitor leads --rash +  Any MYCINS  . Oxcarbazepine Other (See Comments)    Causes deep sleep and dizziness  . Statins Other (See Comments)  . Zolpidem Tartrate     REACTION: "slept for a week"  . Bextra [Valdecoxib] Rash  . Ceclor [Cefaclor] Rash  . Covera-Hs [Verapamil Hcl] Palpitations  . Dicyclomine Hcl Rash  . Tessalon Perles Rash   Current Outpatient Prescriptions  Medication Sig Dispense Refill  . gabapentin (NEURONTIN) 300 MG capsule Take 300 mg  by mouth every evening.     Marland Kitchen HYDROcodone-acetaminophen (NORCO/VICODIN) 5-325 MG tablet Take 1 tablet by mouth 2 (two) times daily.     . meclizine (ANTIVERT) 12.5 MG tablet Take 1 tablet (12.5 mg total) by mouth 3 (three) times daily as needed for dizziness or nausea. 30 tablet 0  . metoprolol succinate (TOPROL XL) 25 MG 24 hr tablet Take 1 tablet (25 mg total) by mouth daily. 30 tablet 6  . montelukast (SINGULAIR) 10 MG tablet TAKE 1 TABLET BY MOUTH AT BEDTIME (Patient taking differently: TAKE 1 TABLET BY MOUTH AT DAILY) 90 tablet 0  . oxybutynin (DITROPAN-XL) 10 MG 24 hr tablet Take 10 mg by mouth every morning. Reported on 02/06/2016  3  . OXYGEN Inhale 1.5 L into the lungs. For naps and sleep.    . pantoprazole (PROTONIX) 40 MG tablet Take 1 tablet by mouth every morning.     . polyethylene glycol (MIRALAX /  GLYCOLAX) packet Take 17 g by mouth daily as needed for mild constipation.     Marland Kitchen warfarin (COUMADIN) 5 MG tablet Take 1/2 to 1 tablet by mouth daily or as directed by coumadin clinic 30 tablet 1  . PRISTIQ 50 MG 24 hr tablet TAKE 1 TABLET BY MOUTH EVERY DAY 30 tablet 0  . terconazole (TERAZOL 7) 0.4 % vaginal cream Place 1 applicator vaginally at bedtime. 45 g 0  . traZODone (DESYREL) 100 MG tablet Take 1 tablet (100 mg total) by mouth at bedtime. 30 tablet 1  . Zinc Oxide 12 % CREA Apply 1 application topically 2 (two) times daily. (Patient not taking: Reported on 08/18/2016) 198 g 1   No current facility-administered medications for this visit.    Social History   Social History  . Marital status: Divorced    Spouse name: N/A  . Number of children: 2  . Years of education: N/A   Occupational History  . disability    Social History Main Topics  . Smoking status: Former Smoker    Quit date: 06/02/2010  . Smokeless tobacco: Never Used  . Alcohol use No  . Drug use: No  . Sexual activity: Yes   Other Topics Concern  . Not on file   Social History Narrative  . No narrative  on file   Family History  Problem Relation Age of Onset  . Dementia Mother   . Diabetes Mother   . Alzheimer's disease Mother   . Heart attack Brother 12  . Heart attack Father   . Schizophrenia Sister   . Diabetes Sister   . Tremor Sister        Objective:    BP 116/80   Pulse (!) 107   Temp 98.6 F (37 C) (Oral)   Resp 16   Ht 5' 3.5" (1.613 m)   Wt 198 lb 3.2 oz (89.9 kg)   SpO2 91%   BMI 34.56 kg/m  Physical Exam  Constitutional: She is oriented to person, place, and time. She appears well-developed and well-nourished. No distress.  HENT:  Head: Normocephalic and atraumatic.  Right Ear: External ear normal.  Left Ear: External ear normal.  Nose: Nose normal.  Mouth/Throat: Oropharynx is clear and moist.  L nare with residual blood; no active site of bleeding.  Eyes: Conjunctivae and EOM are normal. Pupils are equal, round, and reactive to light.  Neck: Normal range of motion. Neck supple. Carotid bruit is not present. No thyromegaly present.  Cardiovascular: Normal rate, regular rhythm, normal heart sounds and intact distal pulses.  Exam reveals no gallop and no friction rub.   No murmur heard. Pulmonary/Chest: Effort normal and breath sounds normal. She has no wheezes. She has no rales.  Abdominal: Soft. Bowel sounds are normal. She exhibits no distension and no mass. There is no tenderness. There is no rebound and no guarding.  Lymphadenopathy:    She has no cervical adenopathy.  Neurological: She is alert and oriented to person, place, and time. No cranial nerve deficit.  Skin: Skin is warm and dry. No rash noted. She is not diaphoretic. No erythema. No pallor.  Psychiatric: She has a normal mood and affect. Her behavior is normal.   Results for orders placed or performed in visit on 08/18/16  CBC with Differential/Platelet  Result Value Ref Range   WBC 6.7 3.8 - 10.8 K/uL   RBC 3.62 (L) 3.80 - 5.10 MIL/uL   Hemoglobin 11.1 (L) 11.7 - 15.5 g/dL  HCT 34.0  (L) 35.0 - 45.0 %   MCV 93.9 80.0 - 100.0 fL   MCH 30.7 27.0 - 33.0 pg   MCHC 32.6 32.0 - 36.0 g/dL   RDW 16.1 09.6 - 04.5 %   Platelets 287 140 - 400 K/uL   MPV 9.9 7.5 - 12.5 fL   Neutro Abs 4,824 1,500 - 7,800 cells/uL   Lymphs Abs 1,340 850 - 3,900 cells/uL   Monocytes Absolute 469 200 - 950 cells/uL   Eosinophils Absolute 67 15 - 500 cells/uL   Basophils Absolute 0 0 - 200 cells/uL   Neutrophils Relative % 72 %   Lymphocytes Relative 20 %   Monocytes Relative 7 %   Eosinophils Relative 1 %   Basophils Relative 0 %   Smear Review Criteria for review not met   Comprehensive metabolic panel  Result Value Ref Range   Sodium 137 135 - 146 mmol/L   Potassium 3.9 3.5 - 5.3 mmol/L   Chloride 100 98 - 110 mmol/L   CO2 26 20 - 31 mmol/L   Glucose, Bld 152 (H) 65 - 99 mg/dL   BUN 11 7 - 25 mg/dL   Creat 4.09 8.11 - 9.14 mg/dL   Total Bilirubin 0.3 0.2 - 1.2 mg/dL   Alkaline Phosphatase 58 33 - 130 U/L   AST 10 10 - 35 U/L   ALT 16 6 - 29 U/L   Total Protein 5.6 (L) 6.1 - 8.1 g/dL   Albumin 3.7 3.6 - 5.1 g/dL   Calcium 9.0 8.6 - 78.2 mg/dL       Assessment & Plan:   1. Epistaxis   2. Other iron deficiency anemia   3. Essential hypertension   4. Vulvovaginal candidiasis   5. Cat bite, initial encounter   6. Need for prophylactic vaccination and inoculation against influenza   7. Need for Tdap vaccination   8. Type 2 HSV infection of vulvovaginal region   9. PAT (paroxysmal atrial tachycardia) (HCC)    -ED records reviewed in detail.  Stable H/H.  S/p ENT consultation; continue nasal saline spray. -recent dx of genital herpes yet has not accepted this diagnosis; still having vaginal irritation; agreeable to prescribe Terazol for patient; genital exam not completed during visit due to establishing care; RTC if no improvement. -obtain labs. -continue current medications. -s/p TDAP for recent cat bite; wound healing nicely. -s/p flu vaccine.   Orders Placed This Encounter   Procedures  . Flu Vaccine QUAD 36+ mos IM  . Tdap vaccine greater than or equal to 7yo IM  . CBC with Differential/Platelet  . Comprehensive metabolic panel   Meds ordered this encounter  Medications  . terconazole (TERAZOL 7) 0.4 % vaginal cream    Sig: Place 1 applicator vaginally at bedtime.    Dispense:  45 g    Refill:  0    Return in about 3 months (around 11/18/2016) for recheck HTN.   Eliannah Hinde Paulita Fujita, M.D. [facility] [address] [place], [place]  [address] [phone] phone [phone] fax

## 2016-08-19 ENCOUNTER — Other Ambulatory Visit (HOSPITAL_COMMUNITY): Payer: Self-pay | Admitting: Psychiatry

## 2016-08-19 ENCOUNTER — Other Ambulatory Visit (HOSPITAL_COMMUNITY): Payer: Self-pay

## 2016-08-19 DIAGNOSIS — F0630 Mood disorder due to known physiological condition, unspecified: Secondary | ICD-10-CM

## 2016-08-19 MED ORDER — TRAZODONE HCL 100 MG PO TABS: 100.0000 mg | ORAL_TABLET | Freq: Every day | ORAL | 1 refills | Status: DC

## 2016-08-24 DIAGNOSIS — A6004 Herpesviral vulvovaginitis: Secondary | ICD-10-CM | POA: Insufficient documentation

## 2016-08-25 ENCOUNTER — Ambulatory Visit (INDEPENDENT_AMBULATORY_CARE_PROVIDER_SITE_OTHER): Payer: Medicare Other | Admitting: Pharmacist Clinician (PhC)/ Clinical Pharmacy Specialist

## 2016-08-25 DIAGNOSIS — Z7901 Long term (current) use of anticoagulants: Secondary | ICD-10-CM

## 2016-08-25 DIAGNOSIS — I480 Paroxysmal atrial fibrillation: Secondary | ICD-10-CM

## 2016-08-25 LAB — POCT INR: INR: 2

## 2016-08-31 ENCOUNTER — Telehealth: Payer: Self-pay

## 2016-08-31 ENCOUNTER — Telehealth: Payer: Self-pay | Admitting: Emergency Medicine

## 2016-08-31 NOTE — Telephone Encounter (Signed)
Per Dr. Tamala Julian, called patient to either come in to be evaluated or go to the hospital via 911.  Patient states she is drinking chicken broth and is keeping it down.  She states that she will not be calling 911, instead she will wait til a neighbor is home and have them take her to [place] because it will be after we are closed.

## 2016-08-31 NOTE — Telephone Encounter (Signed)
Spoke to patient and advised to please come in for evaluation of dehydration and possible Flu. Pt states, her neighbors will not come near here because they feel she has the flu and she is afraid to ride in EMS after recent experience and poor treatment from hospital staff.  She is tolerating some soft foods and chicken broth today Keeping fluids down and took last Zofran tablet. Denies dizziness, weakness at this time.  Advised again to call EMS if sx's worsen with fever,chills,increased nausea and vomiting.   Message routed to Dr. Tamala Julian

## 2016-08-31 NOTE — Telephone Encounter (Signed)
PATIENT STATES SHE CAME INTO THE OFFICE 2 WEEKS AGO TO SEE DR. Tamala Julian AND SHE ASKED HER IF SHE HAD GOTTEN HER FLU SHOT YET. SHE DID GET IT, BUT THE NEXT DAY SHE HAS NOT BEEN ABLE TO GET OUT OF THE BED. SHE IS HAVING VOMITING, DIARRHEA AND SOME FEVER. SHE IS TAKING TYLENOL FOR THE FEVER, BUT THEN SHE THROWS IT BACK UP. SHE DOES NOT HAVE A WAY TO GET BACK IN THE OFFICE. WHAT SHOULD SHE DO? SHE SAID SHE IS OUT OF THE ZOFRAN THAT DR. DAUB GAVE HER BEFORE HE LEFT. BEST PHONE [phone] (CELL) PHARMACY CHOICE IS [facility] [address].  Anna

## 2016-08-31 NOTE — Telephone Encounter (Signed)
Patient states she cannot get here to be seen

## 2016-09-01 ENCOUNTER — Emergency Department (HOSPITAL_COMMUNITY): Payer: Medicare Other

## 2016-09-01 ENCOUNTER — Encounter: Payer: Self-pay | Admitting: Family Medicine

## 2016-09-01 ENCOUNTER — Ambulatory Visit (INDEPENDENT_AMBULATORY_CARE_PROVIDER_SITE_OTHER): Payer: Medicare Other | Admitting: Family Medicine

## 2016-09-01 ENCOUNTER — Encounter (HOSPITAL_COMMUNITY): Payer: Self-pay | Admitting: Emergency Medicine

## 2016-09-01 ENCOUNTER — Emergency Department (HOSPITAL_COMMUNITY)
Admission: EM | Admit: 2016-09-01 | Discharge: 2016-09-01 | Disposition: A | Discharge status: Home / Self Care | Payer: Medicare Other | Attending: Emergency Medicine | Admitting: Emergency Medicine

## 2016-09-01 VITALS — BP 128/80 | HR 107 | Temp 97.7°F | Resp 16

## 2016-09-01 DIAGNOSIS — Z87891 Personal history of nicotine dependence: Secondary | ICD-10-CM | POA: Diagnosis not present

## 2016-09-01 DIAGNOSIS — R531 Weakness: Secondary | ICD-10-CM

## 2016-09-01 DIAGNOSIS — S301XXA Contusion of abdominal wall, initial encounter: Secondary | ICD-10-CM | POA: Diagnosis not present

## 2016-09-01 DIAGNOSIS — I480 Paroxysmal atrial fibrillation: Secondary | ICD-10-CM

## 2016-09-01 DIAGNOSIS — R112 Nausea with vomiting, unspecified: Secondary | ICD-10-CM

## 2016-09-01 DIAGNOSIS — J449 Chronic obstructive pulmonary disease, unspecified: Secondary | ICD-10-CM | POA: Insufficient documentation

## 2016-09-01 DIAGNOSIS — E869 Volume depletion, unspecified: Secondary | ICD-10-CM

## 2016-09-01 DIAGNOSIS — I10 Essential (primary) hypertension: Secondary | ICD-10-CM | POA: Insufficient documentation

## 2016-09-01 DIAGNOSIS — Z7901 Long term (current) use of anticoagulants: Secondary | ICD-10-CM | POA: Diagnosis not present

## 2016-09-01 DIAGNOSIS — Z8673 Personal history of transient ischemic attack (TIA), and cerebral infarction without residual deficits: Secondary | ICD-10-CM | POA: Diagnosis not present

## 2016-09-01 DIAGNOSIS — Z79899 Other long term (current) drug therapy: Secondary | ICD-10-CM | POA: Diagnosis not present

## 2016-09-01 DIAGNOSIS — E860 Dehydration: Secondary | ICD-10-CM | POA: Insufficient documentation

## 2016-09-01 DIAGNOSIS — J9611 Chronic respiratory failure with hypoxia: Secondary | ICD-10-CM | POA: Diagnosis not present

## 2016-09-01 DIAGNOSIS — R1031 Right lower quadrant pain: Secondary | ICD-10-CM

## 2016-09-01 DIAGNOSIS — R05 Cough: Secondary | ICD-10-CM | POA: Diagnosis present

## 2016-09-01 LAB — POCT CBC
GRANULOCYTE PERCENT: 51.9 % (ref 37–80)
HCT, POC: 33 % — AB (ref 37.7–47.9)
Hemoglobin: 11.3 g/dL — AB (ref 12.2–16.2)
LYMPH, POC: 2.7 (ref 0.6–3.4)
MCH, POC: 27.4 pg (ref 27–31.2)
MCHC: 34.3 g/dL (ref 31.8–35.4)
MCV: 79.9 fL — AB (ref 80–97)
MID (cbc): 0.5 (ref 0–0.9)
MPV: 7.9 fL (ref 0–99.8)
PLATELET COUNT, POC: 287 10*3/uL (ref 142–424)
POC Granulocyte: 3.4 (ref 2–6.9)
POC LYMPH %: 40.7 % (ref 10–50)
POC MID %: 7.4 %M (ref 0–12)
RBC: 4.13 M/uL (ref 4.04–5.48)
RDW, POC: 15.5 %
WBC: 6.6 10*3/uL (ref 4.6–10.2)

## 2016-09-01 LAB — CBC WITH DIFFERENTIAL/PLATELET
BASOS ABS: 0 10*3/uL (ref 0.0–0.1)
Basophils Relative: 0 %
EOS PCT: 2 %
Eosinophils Absolute: 0.1 10*3/uL (ref 0.0–0.7)
HCT: 35.7 % — ABNORMAL LOW (ref 36.0–46.0)
HEMOGLOBIN: 11.5 g/dL — AB (ref 12.0–15.0)
LYMPHS PCT: 21 %
Lymphs Abs: 1.1 10*3/uL (ref 0.7–4.0)
MCH: 29.8 pg (ref 26.0–34.0)
MCHC: 32.2 g/dL (ref 30.0–36.0)
MCV: 92.5 fL (ref 78.0–100.0)
Monocytes Absolute: 0.6 10*3/uL (ref 0.1–1.0)
Monocytes Relative: 12 %
NEUTROS PCT: 65 %
Neutro Abs: 3.4 10*3/uL (ref 1.7–7.7)
PLATELETS: 303 10*3/uL (ref 150–400)
RBC: 3.86 MIL/uL — AB (ref 3.87–5.11)
RDW: 13.7 % (ref 11.5–15.5)
WBC: 5.2 10*3/uL (ref 4.0–10.5)

## 2016-09-01 LAB — URINALYSIS, ROUTINE W REFLEX MICROSCOPIC
Bilirubin Urine: NEGATIVE
Glucose, UA: NEGATIVE mg/dL
Hgb urine dipstick: NEGATIVE
Ketones, ur: NEGATIVE mg/dL
NITRITE: NEGATIVE
Protein, ur: NEGATIVE mg/dL
SPECIFIC GRAVITY, URINE: 1.01 (ref 1.005–1.030)
pH: 6.5 (ref 5.0–8.0)

## 2016-09-01 LAB — I-STAT TROPONIN, ED: Troponin i, poc: 0 ng/mL (ref 0.00–0.08)

## 2016-09-01 LAB — URINE MICROSCOPIC-ADD ON: RBC / HPF: NONE SEEN RBC/hpf (ref 0–5)

## 2016-09-01 LAB — COMPREHENSIVE METABOLIC PANEL
ALK PHOS: 52 U/L (ref 38–126)
ALT: 18 U/L (ref 14–54)
AST: 21 U/L (ref 15–41)
Albumin: 3.9 g/dL (ref 3.5–5.0)
Anion gap: 9 (ref 5–15)
BUN: 7 mg/dL (ref 6–20)
CHLORIDE: 101 mmol/L (ref 101–111)
CO2: 27 mmol/L (ref 22–32)
CREATININE: 0.96 mg/dL (ref 0.44–1.00)
Calcium: 9.4 mg/dL (ref 8.9–10.3)
GFR calc Af Amer: >60 mL/min (ref >60)
GFR, EST NON AFRICAN AMERICAN: 59 mL/min — AB (ref >60)
Glucose, Bld: 122 mg/dL — ABNORMAL HIGH (ref 65–99)
Potassium: 3.8 mmol/L (ref 3.5–5.1)
SODIUM: 137 mmol/L (ref 135–145)
Total Bilirubin: 0.7 mg/dL (ref 0.3–1.2)
Total Protein: 6.3 g/dL — ABNORMAL LOW (ref 6.5–8.1)

## 2016-09-01 LAB — LIPASE, BLOOD: Lipase: 25 U/L (ref 11–51)

## 2016-09-01 LAB — PROTIME-INR
INR: 2.22
Prothrombin Time: 25 seconds — ABNORMAL HIGH (ref 11.4–15.2)

## 2016-09-01 LAB — GLUCOSE, POCT (MANUAL RESULT ENTRY): POC GLUCOSE: 141 mg/dL — AB (ref 70–99)

## 2016-09-01 LAB — I-STAT CG4 LACTIC ACID, ED: Lactic Acid, Venous: 1.45 mmol/L (ref 0.5–1.9)

## 2016-09-01 MED ORDER — SODIUM CHLORIDE 0.9 % IV BOLUS (SEPSIS)
1000.0000 mL | Freq: Once | INTRAVENOUS | Status: AC
  Administered 2016-09-01: 1000 mL via INTRAVENOUS

## 2016-09-01 MED ORDER — HYDROCODONE-ACETAMINOPHEN 5-325 MG PO TABS
1.0000 | ORAL_TABLET | Freq: Once | ORAL | Status: AC
  Administered 2016-09-01: 1 via ORAL
  Filled 2016-09-01: qty 1

## 2016-09-01 MED ORDER — ONDANSETRON 4 MG PO TBDP
4.0000 mg | ORAL_TABLET | Freq: Once | ORAL | Status: AC
  Administered 2016-09-01: 4 mg via ORAL

## 2016-09-01 MED ORDER — ONDANSETRON 8 MG PO TBDP: 8.0000 mg | ORAL_TABLET | Freq: Three times a day (TID) | ORAL | 0 refills | Status: DC | PRN

## 2016-09-01 NOTE — Discharge Planning (Signed)
Follow-up appointment needs? n/a  Transportation needs? Pt will call a cab.  Medication assistance needs? n/a  Equipment needs? n/a                                      Oxygen needs? Call in to [facility] to deliver to pt room prior to d/c home.  H.H needs? n/a  Devaunte Gasparini J. Clydene Laming, [place], [place], [place]

## 2016-09-01 NOTE — Telephone Encounter (Signed)
Called and left instr's from Dr Tamala Julian on pt's VM. Asked for CB if any ?s and advised her to get someone to bring her in if she has had a lot of vomiting and diarrhea and might therefore be dehydrated as she might need fluids.

## 2016-09-01 NOTE — Progress Notes (Addendum)
Subjective:  By signing my name below, I, Moises Blood, attest that this documentation has been prepared under the direction and in the presence of Merri Ray, MD. Electronically Signed: Moises Blood, Tea. 09/01/2016 , 9:48 AM .  Patient was seen in Room 11 .   Patient ID: Laurie Horton, female    DOB: Mar 18, 1946, 70 y.o.   MRN: [id number] Chief Complaint  Patient presents with  . Nausea    X 2 weeks   . Emesis  . lose bowls   HPI Laurie Horton is a 70 y.o. female H/o multiple medical problems, including COPD with chronic respiratory failure on home oxygen. She is on oxygen 1.5L rest, and 2L with activity, based on pulmonary note in April. Presents today with nausea, vomiting and loose bowels. She also has history of paroxysmal afib, and is on coumadin.   Patient states she was seen by Dr. Tamala Julian on Oct 17th and received a flu shot. Afterwards, she went to the store. Her symptoms started the next day (Oct 18th) with diarrhea and vomiting. On the first day, she had diarrhea twice, and vomited once. She took Zofran every 8 hours since the 18th due to persistent nausea, but not daily. Her last dose of Zofran was 2 days ago. She's been leaning over her tub when vomiting, and noticed lower abdomen being black and blue with bruising. Her last vomiting was about 3 days ago. Her stool has been becoming a little more consistent. She has chills in office today.   For the past 2 weeks, she's been drinking half quart of chicken broth, drinking some "Coke", and eating crackers. She's tried making instant mashed potatoes and eating dry toast. She lives alone at home, and sometimes have to crawl around the house after sitting for a while. She's been feeling fatigue without much strength, having to hold onto things to get around sometimes. She also has trouble thinking with the sickness.   She has upped her oxygen to 2L. She wasn't able to carry and lift her oxygen with her today.  She had  an episode once after taking augmentin with coumadin. She was sent to the hospital by Dr. Linna Darner. She went to [facility], and received poor service: no toothpaste, or had face washed for 4 days.   Patient Active Problem List   Diagnosis Date Noted  . 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 Hypokalemia   . Vaginal candida   . Epistaxis 08/03/2016  . Acute blood loss anemia 08/03/2016  . Chronic obstructive pulmonary disease, unspecified copd, unspecified chronic bronchitis type 01/14/2016  . Drooling 08/14/2015  . Memory loss 08/14/2015  . MCI (mild cognitive impairment) 08/14/2015  . Carotid stenosis 04/02/2015  . Long-term (current) use of anticoagulants 09/26/2014  . PAF (paroxysmal atrial fibrillation) ([place]) 09/19/2014  . Chronic respiratory failure with hypoxia ([place]) 06/27/2014  . COPD exacerbation ([place]) 06/27/2014  . Chest wall pain 04/06/2014  . Abnormal ECG 05/10/2013  . Atypical chest pain 05/04/2013  . Preoperative respiratory examination 05/02/2013  . Hyperglycemia, drug-induced 10/12/2011  . Encounter for long-term (current) use of medications 08/21/2011  . PAT (paroxysmal atrial tachycardia) ([place]) 08/21/2011  . Right middle lobe pneumonia ([place]) 07/24/2011  . PULMONARY NODULE, LEFT LOWER LOBE 10/14/2009  . PATENT FORAMEN OVALE 10/14/2009  . DEPRESSION 06/04/2009  . Essential hypertension 06/04/2009  . COPD (chronic obstructive pulmonary disease) ([place]) 06/04/2009  . G E R D 06/04/2009  . SNORING, HX OF 06/04/2009  Past Medical History:  Diagnosis Date  . Complication of anesthesia    various issues with oxygen  saturations post op  . COPD (chronic obstructive pulmonary disease) ([place])   . Depression   . Diverticulitis   . DVT (deep venous thrombosis) ([place])   . Fibromyalgia   . GERD (gastroesophageal reflux disease)   . Hiatal hernia   . History of deviated nasal septum    left- side  . HTN (hypertension)   . Hyperlipidemia   . Obesity   .  On supplemental oxygen therapy    concentrator at night @ 1.5 l/m or when sleeps. O2 Sat niormally 87.  . Osteoporosis   . PAT (paroxysmal atrial tachycardia) ([place])   . PFO (patent foramen ovale)   . PULMONARY NODULE, LEFT LOWER LOBE 10/14/2009   48mm LLL nodule dec 2010. Stable and 25mm in Oct 2012. No further fu  . Right middle lobe pneumonia ([place]) 07/24/2011   First noted at admit 07/10/11. Persists on cxr 07/22/11. Cleared on CT 08/24/11. No further followup  . Stroke ([place])   . TOBACCO ABUSE 06/04/2009   Past Surgical History:  Procedure Laterality Date  . CARDIOVASCULAR STRESS TEST  12/26/2004   EF 74%. NO EVIDENCE OF ISCHEMIA  . ESOPHAGOGASTRODUODENOSCOPY (EGD) WITH PROPOFOL N/A 04/15/2015   Procedure: ESOPHAGOGASTRODUODENOSCOPY (EGD) WITH PROPOFOL;  Surgeon: Laurence Spates, MD;  Location: [facility];  Service: Endoscopy;  Laterality: N/A;  . KNEE ARTHROSCOPY  2000   left  . LAPAROSCOPIC CHOLECYSTECTOMY  04-16-2010   [facility]  . MOUTH SURGERY     03-26-15 multiple extractions stitches remains  . TOTAL ABDOMINAL HYSTERECTOMY     post op needed oxygen was told "she gave them a scare"  . TUBAL LIGATION    . US ECHOCARDIOGRAPHY  11/20/2009   EF 55-60%   Allergies  Allergen Reactions  . Alprazolam     REACTION: stops breathing  . Pseudoephedrine Hcl Er Shortness Of Breath  . Budesonide-Formoterol Fumarate     bliusters  . Aciphex [Rabeprazole Sodium]     rash  . Avelox [Moxifloxacin Hcl In Nacl]     Stomach cramps.   . Esomeprazole Magnesium     REACTION: "bouncing off walls"  . Flonase [Fluticasone Propionate]   . Iodine     REACTION: swelling in throat  . Loratadine     claritin D causes shaking  . Lotrimin [Clotrimazole] Other (See Comments)    blisters  . Lunesta [Eszopiclone]     REACTION: "slept for a week"  . Other     Glue from ekg/heart monitor leads --rash +  Any MYCINS  . Oxcarbazepine Other (See Comments)    Causes deep sleep and dizziness  . Statins Other  (See Comments)  . Zolpidem Tartrate     REACTION: "slept for a week"  . Bextra [Valdecoxib] Rash  . Ceclor [Cefaclor] Rash  . Covera-Hs [Verapamil Hcl] Palpitations  . Dicyclomine Hcl Rash  . Tessalon Perles Rash   Prior to Admission medications   Medication Sig Start Date End Date Taking? Authorizing Provider  gabapentin (NEURONTIN) 300 MG capsule Take 300 mg by mouth every evening.     Historical Provider, MD  HYDROcodone-acetaminophen (NORCO/VICODIN) 5-325 MG tablet Take 1 tablet by mouth 2 (two) times daily.     Historical Provider, MD  meclizine (ANTIVERT) 12.5 MG tablet Take 1 tablet (12.5 mg total) by mouth 3 (three) times daily as needed for dizziness or nausea. 08/06/16   Barton Dubois, MD  metoprolol  succinate (TOPROL XL) 25 MG 24 hr tablet Take 1 tablet (25 mg total) by mouth daily. 06/08/16   Peter M Martinique, MD  montelukast (SINGULAIR) 10 MG tablet TAKE 1 TABLET BY MOUTH AT BEDTIME Patient taking differently: TAKE 1 TABLET BY MOUTH AT DAILY 07/21/16   [name], MD  oxybutynin (DITROPAN-XL) 10 MG 24 hr tablet Take 10 mg by mouth every morning. Reported on 02/06/2016 11/28/14   Historical Provider, MD  OXYGEN Inhale 1.5 L into the lungs. For naps and sleep.    Historical Provider, MD  pantoprazole (PROTONIX) 40 MG tablet Take 1 tablet by mouth every morning.  07/24/13   Historical Provider, MD  polyethylene glycol (MIRALAX / GLYCOLAX) packet Take 17 g by mouth daily as needed for mild constipation.     Historical Provider, MD  PRISTIQ 50 MG 24 hr tablet TAKE 1 TABLET BY MOUTH EVERY DAY 08/21/16   Kathlee Nations, MD  terconazole (TERAZOL 7) 0.4 % vaginal cream Place 1 applicator vaginally at bedtime. 08/18/16   Wardell Honour, MD  traZODone (DESYREL) 100 MG tablet Take 1 tablet (100 mg total) by mouth at bedtime. 08/19/16   Kathlee Nations, MD  warfarin (COUMADIN) 5 MG tablet Take 1/2 to 1 tablet by mouth daily or as directed by coumadin clinic 08/10/16   Peter M Martinique, MD  Zinc Oxide 12  % CREA Apply 1 application topically 2 (two) times daily. Patient not taking: Reported on 08/18/2016 07/31/16   Joretta Bachelor, PA   Social History   Social History  . Marital status: Divorced    Spouse name: N/A  . Number of children: 2  . Years of education: N/A   Occupational History  . disability    Social History Main Topics  . Smoking status: Former Smoker    Quit date: 06/02/2010  . Smokeless tobacco: Never Used  . Alcohol use No  . Drug use: No  . Sexual activity: Yes   Other Topics Concern  . Not on file   Social History Narrative  . No narrative on file   Review of Systems  Constitutional: Positive for activity change, appetite change, chills and fatigue. Negative for fever.  Respiratory: Negative for cough, shortness of breath and wheezing.   Gastrointestinal: Positive for abdominal pain, diarrhea, nausea and vomiting. Negative for constipation.       Objective:   Physical Exam  Constitutional: She is oriented to person, place, and time. She appears well-developed and well-nourished. No distress.  HENT:  Head: Normocephalic and atraumatic.  Eyes: EOM are normal. Pupils are equal, round, and reactive to light.  Neck: Neck supple.  Cardiovascular: An irregular rhythm present. Tachycardia present.   Pulmonary/Chest: Effort normal. No respiratory distress.  Abdominal: There is tenderness in the right upper quadrant and right lower quadrant.  Lower abdominal bruising below umbilicus bilaterally  Musculoskeletal: Normal range of motion.  Neurological: She is alert and oriented to person, place, and time.  Skin: Skin is warm and dry.  Psychiatric: She has a normal mood and affect. Her behavior is normal.  Nursing note and vitals reviewed.   Vitals:   09/01/16 0915  BP: 128/80  Pulse: (!) 107  Resp: 16  Temp: 97.7 F (36.5 C)  TempSrc: Oral  SpO2: (!) 84%   EKG sinus rhythm frequent PACs.    Assessment & Plan:   Laurie Horton is a 69 y.o.  female General weakness - Plan: POCT CBC, POCT glucose (manual entry)  Nausea and vomiting, intractability of vomiting not specified, unspecified vomiting type - Plan: POCT glucose (manual entry), ondansetron (ZOFRAN-ODT) disintegrating tablet 4 mg  Right lower quadrant abdominal pain  Volume depletion  Paroxysmal atrial fibrillation (HCC) - Plan: EKG 12-Lead  Contusion of abdominal wall, initial encounter  Chronic respiratory failure with hypoxia (HCC)  History of multiple medical problems including chronic respiratory failure with hypoxia. Did not bring her oxygen with her to office today. Now with almost 2 week history of nausea vomiting loose stools/diarrhea, abdominal pain. Decreased intake with volume depletion/generalized weakness. Also noted some lower abdominal bruising that may be from leaning over the bathtub with vomiting.  Initial possible viral illness with Depletion, but now persistent episodic nausea vomiting abdominal pain and still with some loose stools.   IV placed, monitor placed, EKG obtained. Oxygen placed at 2 L.   10:06 AM  Report given to EMS with transfer of care, and advised charge nurse at [facility].  Meds ordered this encounter  Medications  . ondansetron (ZOFRAN-ODT) disintegrating tablet 4 mg   Patient Instructions       IF you received an x-ray today, you will receive an invoice from [facility]. Please contact [facility] at [phone] with questions or concerns regarding your invoice.   IF you received labwork today, you will receive an invoice from [employer]. Please contact [facility] at [phone] with questions or concerns regarding your invoice.   Our billing staff will not be able to assist you with questions regarding bills from these companies.  You will be contacted with the lab results as soon as they are available. The fastest way to get your results is to activate your My Chart account.  Instructions are located on the last page of this paperwork. If you have not heard from [place] regarding the results in 2 weeks, please contact this office.        I personally performed the services described in this documentation, which was scribed in my presence. The recorded information has been reviewed and considered, and addended by me as needed.   Signed,   Merri Ray, MD [facility].  09/01/16 9:57 AM

## 2016-09-01 NOTE — ED Notes (Signed)
Nurse will draw labs. 

## 2016-09-01 NOTE — ED Notes (Signed)
Patient transported to X-ray 

## 2016-09-01 NOTE — ED Triage Notes (Signed)
Pt came arrived EMS from [place]. States she received flu shot on 10/17 and has had n/v x 2 weeks; denies vomiting x 2 days. Pt denies dizziness, SOB. Doctor office sent for possible dehydration.

## 2016-09-01 NOTE — ED Notes (Addendum)
Phleb getting rest of blood

## 2016-09-01 NOTE — Discharge Instructions (Signed)
Take zofran as prescribed as needed for nausea and vomiting. Drink plenty of fluids. Make sure to have your oxygen with you when you leave your house. Follow up with family doctor. Return if worsening symptoms.

## 2016-09-01 NOTE — ED Provider Notes (Signed)
[facility] DEPT Provider Note   CSN: [id number] Arrival date & time: 09/01/16  1048     History   Chief Complaint Chief Complaint  Patient presents with  . Nausea  . Emesis    HPI Laurie Horton is a 70 y.o. female.  HPI Laurie Horton is a 70 y.o. female with hx of COPD, on home O2, DVTs, Fibromyalgia, HTN, CVA, presents to ED with complaint Of nausea, vomiting, loose stools, cough, body aches, weakness. Patient states her symptoms started 1 week ago. She states she is actually feeling better and is now able to eat some bland foods, toast, and drink broth. She reports diffuse abdominal pain and lower abdominal bruising that she reports is from "vomiting over the edge of the bathtub." She reports generalized pain. She states "I have pain over all 18 points because of her fibromyalgia." She did not take any of her pain medications today because she decided to drive herself to primary care doctor for an appointment. There patient received 4 mg tablet of Zofran, was found to be hypoxic, because patient did not bring her oxygen with her to the office, was found to be possibly volume depleted, transferred to the emergency department. Patient is tearful, stating that she is in pain "all over." She is also stating that her IV in her left hand is hurting her and that the blood pressure cuff is very painful when it blows up to check her blood pressure. Patient is coughing in the room as well. She states that nothing that she has tried is making her symptoms better or worse. She states she had one Zofran leftover from prior illness and she to that yesterday. She states that she believes she may have the flu, she reports that her symptoms started the day after she received flu vaccine.  Past Medical History:  Diagnosis Date  . Complication of anesthesia    various issues with oxygen  saturations post op  . COPD (chronic obstructive pulmonary disease) ([place])   . Depression   .  Diverticulitis   . DVT (deep venous thrombosis) ([place])   . Fibromyalgia   . GERD (gastroesophageal reflux disease)   . Hiatal hernia   . History of deviated nasal septum    left- side  . HTN (hypertension)   . Hyperlipidemia   . Obesity   . On supplemental oxygen therapy    concentrator at night @ 1.5 l/m or when sleeps. O2 Sat niormally 87.  . Osteoporosis   . PAT (paroxysmal atrial tachycardia) ([place])   . PFO (patent foramen ovale)   . PULMONARY NODULE, LEFT LOWER LOBE 10/14/2009   77mm LLL nodule dec 2010. Stable and 25mm in Oct 2012. No further fu  . Right middle lobe pneumonia ([place]) 07/24/2011   First noted at admit 07/10/11. Persists on cxr 07/22/11. Cleared on CT 08/24/11. No further followup  . Stroke ([place])   . TOBACCO ABUSE 06/04/2009    Patient Active Problem List   Diagnosis Date Noted  . 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 Hypokalemia   . Vaginal candida   . Epistaxis 08/03/2016  . Acute blood loss anemia 08/03/2016  . Chronic obstructive pulmonary disease, unspecified copd, unspecified chronic bronchitis type 01/14/2016  . Drooling 08/14/2015  . Memory loss 08/14/2015  . MCI (mild cognitive impairment) 08/14/2015  . Carotid stenosis 04/02/2015  . Long-term (current) use of anticoagulants 09/26/2014  . PAF (paroxysmal atrial fibrillation) ([place]) 09/19/2014  . Chronic respiratory  failure with hypoxia ([place]) 06/27/2014  . COPD exacerbation ([place]) 06/27/2014  . Chest wall pain 04/06/2014  . Abnormal ECG 05/10/2013  . Atypical chest pain 05/04/2013  . Preoperative respiratory examination 05/02/2013  . Hyperglycemia, drug-induced 10/12/2011  . Encounter for long-term (current) use of medications 08/21/2011  . PAT (paroxysmal atrial tachycardia) ([place]) 08/21/2011  . Right middle lobe pneumonia ([place]) 07/24/2011  . PULMONARY NODULE, LEFT LOWER LOBE 10/14/2009  . PATENT FORAMEN OVALE 10/14/2009  . DEPRESSION 06/04/2009  . Essential hypertension 06/04/2009    . COPD (chronic obstructive pulmonary disease) ([place]) 06/04/2009  . G E R D 06/04/2009  . SNORING, HX OF 06/04/2009    Past Surgical History:  Procedure Laterality Date  . CARDIOVASCULAR STRESS TEST  12/26/2004   EF 74%. NO EVIDENCE OF ISCHEMIA  . ESOPHAGOGASTRODUODENOSCOPY (EGD) WITH PROPOFOL N/A 04/15/2015   Procedure: ESOPHAGOGASTRODUODENOSCOPY (EGD) WITH PROPOFOL;  Surgeon: Laurence Spates, MD;  Location: [facility];  Service: Endoscopy;  Laterality: N/A;  . KNEE ARTHROSCOPY  2000   left  . LAPAROSCOPIC CHOLECYSTECTOMY  04-16-2010   [facility]  . MOUTH SURGERY     03-26-15 multiple extractions stitches remains  . TOTAL ABDOMINAL HYSTERECTOMY     post op needed oxygen was told "she gave them a scare"  . TUBAL LIGATION    . US ECHOCARDIOGRAPHY  11/20/2009   EF 55-60%    OB History    No data available       Home Medications    Prior to Admission medications   Medication Sig Start Date End Date Taking? Authorizing Provider  HYDROcodone-acetaminophen (NORCO/VICODIN) 5-325 MG tablet Take 1 tablet by mouth 2 (two) times daily.    Yes Historical Provider, MD  ipratropium (ATROVENT) 0.02 % nebulizer solution Inhale 1 mL into the lungs every 4 (four) hours. 08/16/16  Yes Historical Provider, MD  metoprolol succinate (TOPROL XL) 25 MG 24 hr tablet Take 1 tablet (25 mg total) by mouth daily. 06/08/16  Yes Peter M Martinique, MD  montelukast (SINGULAIR) 10 MG tablet TAKE 1 TABLET BY MOUTH AT BEDTIME Patient taking differently: TAKE 1 TABLET BY MOUTH AT DAILY 07/21/16  Yes [name], MD  oxybutynin (DITROPAN-XL) 10 MG 24 hr tablet Take 10 mg by mouth every morning. Reported on 02/06/2016 11/28/14  Yes Historical Provider, MD  OXYGEN Inhale 1.5 L into the lungs. For naps and sleep.   Yes Historical Provider, MD  pantoprazole (PROTONIX) 40 MG tablet Take 1 tablet by mouth every morning.  07/24/13  Yes Historical Provider, MD  polyethylene glycol (MIRALAX / GLYCOLAX) packet Take 17 g by mouth daily  as needed for mild constipation.    Yes Historical Provider, MD  PRISTIQ 50 MG 24 hr tablet TAKE 1 TABLET BY MOUTH EVERY DAY 08/21/16  Yes Kathlee Nations, MD  rosuvastatin (CRESTOR) 5 MG tablet Take 5 mg by mouth once a week. Sunday 07/30/16  Yes Historical Provider, MD  traZODone (DESYREL) 100 MG tablet Take 1 tablet (100 mg total) by mouth at bedtime. 08/19/16  Yes Kathlee Nations, MD  warfarin (COUMADIN) 5 MG tablet Take 1/2 to 1 tablet by mouth daily or as directed by coumadin clinic Patient taking differently: Take 2.5-5 mg by mouth daily. Take 2.5 on Tues, Wed, Thur, Sat, Sun and 5mg  on Mon and Fri 08/10/16  Yes Peter M Martinique, MD  meclizine (ANTIVERT) 12.5 MG tablet Take 1 tablet (12.5 mg total) by mouth 3 (three) times daily as needed for dizziness or nausea. Patient  not taking: Reported on 09/01/2016 08/06/16   Barton Dubois, MD  terconazole (TERAZOL 7) 0.4 % vaginal cream Place 1 applicator vaginally at bedtime. Patient not taking: Reported on 09/01/2016 08/18/16   Wardell Honour, MD  Zinc Oxide 12 % CREA Apply 1 application topically 2 (two) times daily. Patient not taking: Reported on 09/01/2016 07/31/16   Joretta Bachelor, PA    Family History Family History  Problem Relation Age of Onset  . Dementia Mother   . Diabetes Mother   . Alzheimer's disease Mother   . Heart attack Brother 79  . Heart attack Father   . Schizophrenia Sister   . Diabetes Sister   . Tremor Sister     Social History Social History  Substance Use Topics  . Smoking status: Former Smoker    Quit date: 06/02/2010  . Smokeless tobacco: Never Used  . Alcohol use No     Allergies   Alprazolam; Pseudoephedrine hcl er; Budesonide-formoterol fumarate; Aciphex [rabeprazole sodium]; Avelox [moxifloxacin hcl in nacl]; Esomeprazole magnesium; Flonase [fluticasone propionate]; Iodine; Loratadine; Lotrimin [clotrimazole]; Lunesta [eszopiclone]; Other; Oxcarbazepine; Statins; Zolpidem tartrate; Bextra [valdecoxib];  Ceclor [cefaclor]; Covera-hs [verapamil hcl]; Dicyclomine hcl; and Tessalon perles   Review of Systems Review of Systems  Constitutional: Positive for fatigue. Negative for chills and fever.  HENT: Positive for congestion.   Respiratory: Positive for cough. Negative for chest tightness and shortness of breath.   Cardiovascular: Negative for chest pain, palpitations and leg swelling.  Gastrointestinal: Positive for abdominal pain, nausea and vomiting. Negative for diarrhea.  Genitourinary: Negative for dysuria, flank pain and pelvic pain.  Musculoskeletal: Positive for arthralgias and myalgias. Negative for neck pain and neck stiffness.  Skin: Negative for rash.  Neurological: Positive for weakness and headaches. Negative for dizziness.  All other systems reviewed and are negative.    Physical Exam Updated Vital Signs BP 147/91   Pulse 91   Temp 98.8 F (37.1 C) (Oral)   Resp 17   Ht 5' 3.5" (1.613 m)   Wt 89.8 kg   SpO2 91%   BMI 34.52 kg/m   Physical Exam  Constitutional: She is oriented to person, place, and time. She appears well-developed and well-nourished. No distress.  HENT:  Head: Normocephalic.  Eyes: Conjunctivae are normal.  Neck: Normal range of motion. Neck supple.  Cardiovascular: Normal rate, regular rhythm and normal heart sounds.   Pulmonary/Chest: Effort normal and breath sounds normal. No respiratory distress. She has no wheezes. She has no rales.  Abdominal: Soft. Bowel sounds are normal. She exhibits no distension. There is tenderness. There is no rebound.  Some bruising noted in the lower abdomen, however no guarding, no rebound tenderness  Musculoskeletal: She exhibits no edema.  Neurological: She is alert and oriented to person, place, and time.  Skin: Skin is warm and dry.  Psychiatric: She has a normal mood and affect. Her behavior is normal.  Nursing note and vitals reviewed.    ED Treatments / Results  Labs (all labs ordered are listed,  but only abnormal results are displayed) Labs Reviewed  CBC WITH DIFFERENTIAL/PLATELET - Abnormal; Notable for the following:       Result Value   RBC 3.86 (*)    Hemoglobin 11.5 (*)    HCT 35.7 (*)    All other components within normal limits  COMPREHENSIVE METABOLIC PANEL - Abnormal; Notable for the following:    Glucose, Bld 122 (*)    Total Protein 6.3 (*)    GFR calc  non Af Amer 59 (*)    All other components within normal limits  URINALYSIS, ROUTINE W REFLEX MICROSCOPIC (NOT AT [facility]) - Abnormal; Notable for the following:    Leukocytes, UA SMALL (*)    All other components within normal limits  PROTIME-INR - Abnormal; Notable for the following:    Prothrombin Time 25.0 (*)    All other components within normal limits  URINE MICROSCOPIC-ADD ON - Abnormal; Notable for the following:    Squamous Epithelial / LPF 6-30 (*)    Bacteria, UA FEW (*)    All other components within normal limits  LIPASE, BLOOD  I-STAT CG4 LACTIC ACID, ED  I-STAT TROPOININ, ED    EKG  EKG Interpretation  Date/Time:  Tuesday September 01 2016 14:15:17 EDT Ventricular Rate:  70 PR Interval:    QRS Duration: 91 QT Interval:  421 QTC Calculation: 455 R Axis:   35 Text Interpretation:  Sinus rhythm Atrial premature complexes Low voltage, precordial leads Nonspecific T abnormalities, anterior leads No STEMI Confirmed by LONG MD, JOSHUA [id number]) on 09/02/2016 4:02:40 PM       Radiology Dg Chest 2 View  Result Date: 09/01/2016 CLINICAL DATA:  Midline to RIGHT side chest pain with coughing and deep inspiration, productive cough, shortness of breath this morning, hypertension, former smoker, COPD EXAM: CHEST  2 VIEW COMPARISON:  09/19/2014; chest CT 01/21/2016 FINDINGS: Enlargement of cardiac silhouette with pulmonary vascular congestion. Atherosclerotic calcification mild tortuosity of thoracic aorta. Chronic accentuation of interstitial markings grossly unchanged. Bibasilar atelectasis. No definite  acute infiltrate, pleural effusion or pneumothorax identified when accounting for differences in technique. Diffuse osseous demineralization. IMPRESSION: Chronic accentuation of interstitial markings with bibasilar atelectasis. Enlargement of cardiac silhouette with pulmonary vascular congestion. Electronically Signed   By: Lavonia Dana M.D.   On: 09/01/2016 13:47    Procedures Procedures (including critical care time)  Medications Ordered in ED Medications  HYDROcodone-acetaminophen (NORCO/VICODIN) 5-325 MG per tablet 1 tablet (1 tablet Oral Given 09/01/16 1219)  sodium chloride 0.9 % bolus 1,000 mL (0 mLs Intravenous Stopped 09/01/16 1417)     Initial Impression / Assessment and Plan / ED Course  I have reviewed the triage vital signs and the nursing notes.  Pertinent labs & imaging results that were available during my care of the patient were reviewed by me and considered in my medical decision making (see chart for details).  Clinical Course   Pt in emergency dept with diffuse body aches, fever, chills, nausea, vomiting, loose stools, cough. Will check labs, hydrate with IV fluis, get CXR. Agreed to give pt her regular dose of her vicodin here for pain.   2:00 PM Pt's labs and CXR unremarkable. Pt is feeling better. She was able to eat a toast and drink fluids with no nausea or vomiting. Her VS are normal. Pt is stable for dc home, however unable  3:12 PM Patient in emergency department without her home oxygen. She is stable for discharge home, however she does not have a ride home. Patient is thinking about calling a cab. We took patient off of oxygen, she states she is technically supposed to be on oxygen all the time, as directed by Dr. [name], but admitted that she drove her car today without home oxygen because "I have trouble pulling the oxygen tank." Patient desatted to the 56s and 70s with no oxygen. I do not think it is safe for patient to be discharged home by cab or even  by family member with  no O2. Case manager consult.   4:38 PM Pt was given an oxygen tank to take home. Pt's daughter on the way to pick her up.   Vitals:   09/01/16 1415 09/01/16 1445 09/01/16 1521 09/01/16 1647  BP:  147/91 141/74 146/94  Pulse:  91 71 81  Resp:  17 17 20   Temp:   98.1 F (36.7 C) 98.2 F (36.8 C)  TempSrc:   Oral Oral  SpO2: 91%  90% 94%  Weight:      Height:         Final Clinical Impressions(s) / ED Diagnoses   Final diagnoses:  Nausea and vomiting, intractability of vomiting not specified, unspecified vomiting type  Dehydration  Chronic obstructive pulmonary disease, unspecified COPD type ([place])    New Prescriptions Discharge Medication List as of 09/01/2016  4:38 PM    START taking these medications   Details  ondansetron (ZOFRAN ODT) 8 MG disintegrating tablet Take 1 tablet (8 mg total) by mouth every 8 (eight) hours as needed for nausea or vomiting., Starting Tue 09/01/2016, Print         Jeannett Senior, PA-C 09/02/16 1651    Merrily Pew, MD 09/03/16 [id number]

## 2016-09-01 NOTE — Telephone Encounter (Signed)
Call --- recommend BRAT diet (toast, crackers, applesauce, bananas) and frequent small fluid intake.  Recommend coming in for evaluation if vomiting or diarrhea continues.

## 2016-09-01 NOTE — Patient Instructions (Addendum)
   Further evaluation through the emergency room today. If they do discharge you, can follow-up with [place] if needed.   IF you received an x-ray today, you will receive an invoice from [facility]. Please contact [facility] at [phone] with questions or concerns regarding your invoice.   IF you received labwork today, you will receive an invoice from [employer]. Please contact [facility] at [phone] with questions or concerns regarding your invoice.   Our billing staff will not be able to assist you with questions regarding bills from these companies.  You will be contacted with the lab results as soon as they are available. The fastest way to get your results is to activate your My Chart account. Instructions are located on the last page of this paperwork. If you have not heard from [place] regarding the results in 2 weeks, please contact this office.

## 2016-09-06 ENCOUNTER — Emergency Department (HOSPITAL_COMMUNITY)
Admission: EM | Admit: 2016-09-06 | Discharge: 2016-09-06 | Disposition: A | Discharge status: Home / Self Care | Payer: Medicare Other | Attending: Emergency Medicine | Admitting: Emergency Medicine

## 2016-09-06 ENCOUNTER — Encounter (HOSPITAL_COMMUNITY): Payer: Self-pay

## 2016-09-06 ENCOUNTER — Emergency Department (HOSPITAL_COMMUNITY): Payer: Medicare Other

## 2016-09-06 DIAGNOSIS — J449 Chronic obstructive pulmonary disease, unspecified: Secondary | ICD-10-CM | POA: Diagnosis not present

## 2016-09-06 DIAGNOSIS — I10 Essential (primary) hypertension: Secondary | ICD-10-CM | POA: Insufficient documentation

## 2016-09-06 DIAGNOSIS — Z7901 Long term (current) use of anticoagulants: Secondary | ICD-10-CM | POA: Insufficient documentation

## 2016-09-06 DIAGNOSIS — R531 Weakness: Secondary | ICD-10-CM

## 2016-09-06 DIAGNOSIS — Z8673 Personal history of transient ischemic attack (TIA), and cerebral infarction without residual deficits: Secondary | ICD-10-CM | POA: Diagnosis not present

## 2016-09-06 DIAGNOSIS — R109 Unspecified abdominal pain: Secondary | ICD-10-CM

## 2016-09-06 DIAGNOSIS — R52 Pain, unspecified: Secondary | ICD-10-CM

## 2016-09-06 DIAGNOSIS — R1030 Lower abdominal pain, unspecified: Secondary | ICD-10-CM | POA: Insufficient documentation

## 2016-09-06 DIAGNOSIS — Z79899 Other long term (current) drug therapy: Secondary | ICD-10-CM | POA: Diagnosis not present

## 2016-09-06 DIAGNOSIS — Z87891 Personal history of nicotine dependence: Secondary | ICD-10-CM | POA: Insufficient documentation

## 2016-09-06 LAB — COMPREHENSIVE METABOLIC PANEL
ALBUMIN: 3.7 g/dL (ref 3.5–5.0)
ALT: 18 U/L (ref 14–54)
ANION GAP: 9 (ref 5–15)
AST: 22 U/L (ref 15–41)
Alkaline Phosphatase: 51 U/L (ref 38–126)
BILIRUBIN TOTAL: 0.7 mg/dL (ref 0.3–1.2)
BUN: 10 mg/dL (ref 6–20)
CO2: 26 mmol/L (ref 22–32)
Calcium: 9.4 mg/dL (ref 8.9–10.3)
Chloride: 100 mmol/L — ABNORMAL LOW (ref 101–111)
Creatinine, Ser: 0.92 mg/dL (ref 0.44–1.00)
GFR calc Af Amer: >60 mL/min (ref >60)
GFR calc non Af Amer: >60 mL/min (ref >60)
GLUCOSE: 124 mg/dL — AB (ref 65–99)
POTASSIUM: 3.9 mmol/L (ref 3.5–5.1)
SODIUM: 135 mmol/L (ref 135–145)
TOTAL PROTEIN: 6 g/dL — AB (ref 6.5–8.1)

## 2016-09-06 LAB — CBC
HEMATOCRIT: 35 % — AB (ref 36.0–46.0)
HEMOGLOBIN: 11.3 g/dL — AB (ref 12.0–15.0)
MCH: 29.4 pg (ref 26.0–34.0)
MCHC: 32.3 g/dL (ref 30.0–36.0)
MCV: 91.1 fL (ref 78.0–100.0)
Platelets: 264 10*3/uL (ref 150–400)
RBC: 3.84 MIL/uL — AB (ref 3.87–5.11)
RDW: 13.6 % (ref 11.5–15.5)
WBC: 8 10*3/uL (ref 4.0–10.5)

## 2016-09-06 LAB — URINE MICROSCOPIC-ADD ON
Bacteria, UA: NONE SEEN
RBC / HPF: NONE SEEN RBC/hpf (ref 0–5)

## 2016-09-06 LAB — URINALYSIS, ROUTINE W REFLEX MICROSCOPIC
Bilirubin Urine: NEGATIVE
Glucose, UA: NEGATIVE mg/dL
Hgb urine dipstick: NEGATIVE
Ketones, ur: NEGATIVE mg/dL
NITRITE: NEGATIVE
PH: 8.5 — AB (ref 5.0–8.0)
Protein, ur: NEGATIVE mg/dL
SPECIFIC GRAVITY, URINE: 1.011 (ref 1.005–1.030)

## 2016-09-06 LAB — PROTIME-INR
INR: 2.31
Prothrombin Time: 25.8 seconds — ABNORMAL HIGH (ref 11.4–15.2)

## 2016-09-06 LAB — TROPONIN I

## 2016-09-06 LAB — LIPASE, BLOOD: Lipase: 19 U/L (ref 11–51)

## 2016-09-06 NOTE — Discharge Instructions (Signed)
Follow up with your primary doctor to be rechecked in the next couple of days, continue your current medications

## 2016-09-06 NOTE — ED Triage Notes (Signed)
Patient here with ongoing lower abdominal pain since being evaluated for same last Monday. Patient states she is continuing to experience lower abdominal pain with no nausea, no vomiting, no diarrhea. Complains of weakness with same

## 2016-09-06 NOTE — ED Notes (Signed)
Patient assisted to restroom for urine sample.

## 2016-09-06 NOTE — ED Provider Notes (Signed)
[facility] DEPT Provider Note   CSN: [id number] Arrival date & time: 09/06/16  1319     History   Chief Complaint Chief Complaint  Patient presents with  . Abdominal Pain  . Weakness    HPI HONESTEE COPPLER is a 70 y.o. female.  HPI Pt has been having trouble with nausea, cough, and abdominal pain.  It started this week.  Her symptoms have persisted.  Last night she had a sharp stabbing pain in her back.  She thought her lungs were collapsing.    She has not had any vomiting or diarrhea.  She had a normal bowel movement today.  She is having pain in her lower abdomen.  No trouble urinating.    She saw her doctor on Monday.  She was sent to the ED for further evaluation.  She was told to go to the ED.  She had an evaluation and was told there were no acute issues.  Past Medical History:  Diagnosis Date  . Complication of anesthesia    various issues with oxygen  saturations post op  . COPD (chronic obstructive pulmonary disease) ([place])   . Depression   . Diverticulitis   . DVT (deep venous thrombosis) ([place])   . Fibromyalgia   . GERD (gastroesophageal reflux disease)   . Hiatal hernia   . History of deviated nasal septum    left- side  . HTN (hypertension)   . Hyperlipidemia   . Obesity   . On supplemental oxygen therapy    concentrator at night @ 1.5 l/m or when sleeps. O2 Sat niormally 87.  . Osteoporosis   . PAT (paroxysmal atrial tachycardia) ([place])   . PFO (patent foramen ovale)   . PULMONARY NODULE, LEFT LOWER LOBE 10/14/2009   36mm LLL nodule dec 2010. Stable and 27mm in Oct 2012. No further fu  . Right middle lobe pneumonia ([place]) 07/24/2011   First noted at admit 07/10/11. Persists on cxr 07/22/11. Cleared on CT 08/24/11. No further followup  . Stroke ([place])   . TOBACCO ABUSE 06/04/2009    Patient Active Problem List   Diagnosis Date Noted  . 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 Hypokalemia   . Vaginal candida   . Epistaxis 08/03/2016    . Acute blood loss anemia 08/03/2016  . Chronic obstructive pulmonary disease, unspecified copd, unspecified chronic bronchitis type 01/14/2016  . Drooling 08/14/2015  . Memory loss 08/14/2015  . MCI (mild cognitive impairment) 08/14/2015  . Carotid stenosis 04/02/2015  . Long-term (current) use of anticoagulants 09/26/2014  . PAF (paroxysmal atrial fibrillation) ([place]) 09/19/2014  . Chronic respiratory failure with hypoxia ([place]) 06/27/2014  . COPD exacerbation ([place]) 06/27/2014  . Chest wall pain 04/06/2014  . Abnormal ECG 05/10/2013  . Atypical chest pain 05/04/2013  . Preoperative respiratory examination 05/02/2013  . Hyperglycemia, drug-induced 10/12/2011  . Encounter for long-term (current) use of medications 08/21/2011  . PAT (paroxysmal atrial tachycardia) ([place]) 08/21/2011  . Right middle lobe pneumonia ([place]) 07/24/2011  . PULMONARY NODULE, LEFT LOWER LOBE 10/14/2009  . PATENT FORAMEN OVALE 10/14/2009  . DEPRESSION 06/04/2009  . Essential hypertension 06/04/2009  . COPD (chronic obstructive pulmonary disease) ([place]) 06/04/2009  . G E R D 06/04/2009  . SNORING, HX OF 06/04/2009    Past Surgical History:  Procedure Laterality Date  . CARDIOVASCULAR STRESS TEST  12/26/2004   EF 74%. NO EVIDENCE OF ISCHEMIA  . ESOPHAGOGASTRODUODENOSCOPY (EGD) WITH PROPOFOL N/A 04/15/2015   Procedure: ESOPHAGOGASTRODUODENOSCOPY (EGD)  WITH PROPOFOL;  Surgeon: Laurence Spates, MD;  Location: [facility] END[facility]COPY;  Service: Endoscopy;  Laterality: N/A;  . KNEE ARTHROSCOPY  2000   left  . LAPAROSCOPIC CHOLECYSTECTOMY  04-16-2010   [facility]  . MOUTH SURGERY     03-26-15 multiple extractions stitches remains  . TOTAL ABDOMINAL HYSTERECTOMY     post op needed oxygen was told "she gave them a scare"  . TUBAL LIGATION    . US ECHOCARDIOGRAPHY  11/20/2009   EF 55-60%    OB History    No data available       Home Medications    Prior to Admission medications   Medication Sig Start Date End Date Taking?  Authorizing Provider  HYDROcodone-acetaminophen (NORCO/VICODIN) 5-325 MG tablet Take 1 tablet by mouth 2 (two) times daily.     Historical Provider, MD  ipratropium (ATROVENT) 0.02 % nebulizer solution Inhale 1 mL into the lungs every 4 (four) hours. 08/16/16   Historical Provider, MD  meclizine (ANTIVERT) 12.5 MG tablet Take 1 tablet (12.5 mg total) by mouth 3 (three) times daily as needed for dizziness or nausea. Patient not taking: Reported on 09/01/2016 08/06/16   Barton Dubois, MD  metoprolol succinate (TOPROL XL) 25 MG 24 hr tablet Take 1 tablet (25 mg total) by mouth daily. 06/08/16   Peter M Martinique, MD  montelukast (SINGULAIR) 10 MG tablet TAKE 1 TABLET BY MOUTH AT BEDTIME Patient taking differently: TAKE 1 TABLET BY MOUTH AT DAILY 07/21/16   [name], MD  ondansetron (ZOFRAN ODT) 8 MG disintegrating tablet Take 1 tablet (8 mg total) by mouth every 8 (eight) hours as needed for nausea or vomiting. 09/01/16   Tatyana Kirichenko, PA-C  oxybutynin (DITROPAN-XL) 10 MG 24 hr tablet Take 10 mg by mouth every morning. Reported on 02/06/2016 11/28/14   Historical Provider, MD  OXYGEN Inhale 1.5 L into the lungs. For naps and sleep.    Historical Provider, MD  pantoprazole (PROTONIX) 40 MG tablet Take 1 tablet by mouth every morning.  07/24/13   Historical Provider, MD  polyethylene glycol (MIRALAX / GLYCOLAX) packet Take 17 g by mouth daily as needed for mild constipation.     Historical Provider, MD  PRISTIQ 50 MG 24 hr tablet TAKE 1 TABLET BY MOUTH EVERY DAY 08/21/16   Kathlee Nations, MD  rosuvastatin (CRESTOR) 5 MG tablet Take 5 mg by mouth once a week. Sunday 07/30/16   Historical Provider, MD  terconazole (TERAZOL 7) 0.4 % vaginal cream Place 1 applicator vaginally at bedtime. Patient not taking: Reported on 09/01/2016 08/18/16   Wardell Honour, MD  traZODone (DESYREL) 100 MG tablet Take 1 tablet (100 mg total) by mouth at bedtime. 08/19/16   Kathlee Nations, MD  warfarin (COUMADIN) 5 MG tablet Take  1/2 to 1 tablet by mouth daily or as directed by coumadin clinic Patient taking differently: Take 2.5-5 mg by mouth daily. Take 2.5 on Tues, Wed, Thur, Sat, Sun and 5mg  on Mon and Fri 08/10/16   Peter M Martinique, MD  Zinc Oxide 12 % CREA Apply 1 application topically 2 (two) times daily. Patient not taking: Reported on 09/01/2016 07/31/16   Joretta Bachelor, PA    Family History Family History  Problem Relation Age of Onset  . Dementia Mother   . Diabetes Mother   . Alzheimer's disease Mother   . Heart attack Brother 31  . Heart attack Father   . Schizophrenia Sister   . Diabetes Sister   .  Tremor Sister     Social History Social History  Substance Use Topics  . Smoking status: Former Smoker    Quit date: 06/02/2010  . Smokeless tobacco: Never Used  . Alcohol use No     Allergies   Alprazolam; Pseudoephedrine hcl er; Budesonide-formoterol fumarate; Aciphex [rabeprazole sodium]; Avelox [moxifloxacin hcl in nacl]; Esomeprazole magnesium; Flonase [fluticasone propionate]; Iodine; Loratadine; Lotrimin [clotrimazole]; Lunesta [eszopiclone]; Other; Oxcarbazepine; Statins; Zolpidem tartrate; Bextra [valdecoxib]; Ceclor [cefaclor]; Covera-hs [verapamil hcl]; Dicyclomine hcl; and Tessalon perles   Review of Systems Review of Systems  All other systems reviewed and are negative.    Physical Exam Updated Vital Signs BP 136/85 (BP Location: Right Arm)   Pulse 74   Temp 98 F (36.7 C) (Oral)   Resp 18   SpO2 94%   Physical Exam  Constitutional: No distress.  HENT:  Head: Normocephalic and atraumatic.  Right Ear: External ear normal.  Left Ear: External ear normal.  Eyes: Conjunctivae are normal. Right eye exhibits no discharge. Left eye exhibits no discharge. No scleral icterus.  Neck: Neck supple. No tracheal deviation present.  Cardiovascular: Normal rate, regular rhythm and intact distal pulses.   Pulmonary/Chest: Effort normal and breath sounds normal. No stridor. No  respiratory distress. She has no wheezes. She has no rales.  Abdominal: Soft. Bowel sounds are normal. She exhibits no distension. There is tenderness in the suprapubic area. There is no rigidity, no rebound and no guarding. No hernia.  Musculoskeletal: She exhibits no edema or tenderness.  Neurological: She is alert. She has normal strength. No cranial nerve deficit (no facial droop, extraocular movements intact, no slurred speech) or sensory deficit. She exhibits normal muscle tone. She displays no seizure activity. Coordination normal.  Skin: Skin is warm and dry. No rash noted. She is not diaphoretic.  Psychiatric: She has a normal mood and affect.  Nursing note and vitals reviewed.    ED Treatments / Results  Labs (all labs ordered are listed, but only abnormal results are displayed) Labs Reviewed  COMPREHENSIVE METABOLIC PANEL - Abnormal; Notable for the following:       Result Value   Chloride 100 (*)    Glucose, Bld 124 (*)    Total Protein 6.0 (*)    All other components within normal limits  CBC - Abnormal; Notable for the following:    RBC 3.84 (*)    Hemoglobin 11.3 (*)    HCT 35.0 (*)    All other components within normal limits  URINALYSIS, ROUTINE W REFLEX MICROSCOPIC (NOT AT [facility]) - Abnormal; Notable for the following:    pH 8.5 (*)    Leukocytes, UA TRACE (*)    All other components within normal limits  PROTIME-INR - Abnormal; Notable for the following:    Prothrombin Time 25.8 (*)    All other components within normal limits  URINE MICROSCOPIC-ADD ON - Abnormal; Notable for the following:    Squamous Epithelial / LPF 6-30 (*)    All other components within normal limits  LIPASE, BLOOD  TROPONIN I     Radiology Ct Abdomen Pelvis Wo Contrast  Result Date: 09/06/2016 CLINICAL DATA:  Lower abdominal pain for over 1 week. EXAM: CT ABDOMEN AND PELVIS WITHOUT CONTRAST TECHNIQUE: Multidetector CT imaging of the abdomen and pelvis was performed following the  standard protocol without IV contrast. COMPARISON:  03/29/2013 CT abdomen/pelvis. FINDINGS: Lower chest: Moderate centrilobular emphysema at the lung bases. Cardiomegaly. Coronary atherosclerosis. Hepatobiliary: Mild diffuse hepatic steatosis. No liver mass. Liver  surface appears slightly irregular, cannot exclude cirrhosis. Cholecystectomy. Bile ducts are within expected post cholecystectomy limits with common bile duct diameter 8 mm. Pancreas: Normal, with no mass or duct dilation. Spleen: Normal size. No mass. Adrenals/Urinary Tract: Normal adrenals. No renal stones. No hydronephrosis. Partially exophytic 1.2 cm renal cyst in the lateral interpolar right kidney. No additional contour deforming renal masses. Normal caliber ureters, with no ureteral stones. No bladder stones or bladder wall thickening. Stomach/Bowel: Grossly normal stomach. Normal caliber small bowel with no small bowel wall thickening. Normal appendix. Moderate sigmoid diverticulosis, with no large bowel wall thickening or pericolonic fat stranding. Vascular/Lymphatic: Atherosclerotic nonaneurysmal abdominal aorta. No pathologically enlarged lymph nodes in the abdomen or pelvis. Reproductive: Status post hysterectomy, with no abnormal findings at the vaginal cuff. No adnexal mass. Other: No pneumoperitoneum, ascites or focal fluid collection. Stable small fat containing umbilical hernia. Stable focal diastasis of the umbilical ventral abdominal wall. Musculoskeletal: No aggressive appearing focal osseous lesions. Moderate thoracolumbar spondylosis. IMPRESSION: 1. No acute abnormality. No evidence of bowel obstruction or acute bowel inflammation. Normal appendix. Moderate sigmoid diverticulosis, with no evidence of acute diverticulitis. 2. Stable small fat containing umbilical hernia and concomitant mild focal diastasis of the umbilical ventral abdominal wall. 3. Mild diffuse hepatic steatosis. Question irregular liver surface, cannot exclude  cirrhosis. Consider hepatic elastography for further liver fibrosis risk stratification, as clinically warranted. 4. Aortic atherosclerosis.  Coronary atherosclerosis. 5. Moderate emphysema at the lung bases. Electronically Signed   By: Ilona Sorrel M.D.   On: 09/06/2016 16:19   Dg Chest 2 View  Result Date: 09/06/2016 CLINICAL DATA:  Cough and nausea for 6 days EXAM: CHEST  2 VIEW COMPARISON:  09/01/2016 FINDINGS: Cardiac shadow remains enlarged. Vascular congestion with interstitial edema is again noted. No sizable effusion is seen. The lungs are hyperinflated consistent with COPD. No acute bony abnormality is seen. IMPRESSION: Changes consistent with CHF. Electronically Signed   By: Inez Catalina M.D.   On: 09/06/2016 15:15    Procedures Procedures (including critical care time)  Medications Ordered in ED Medications - No data to display   Initial Impression / Assessment and Plan / ED Course  I have reviewed the triage vital signs and the nursing notes.  Pertinent labs & imaging results that were available during my care of the patient were reviewed by me and considered in my medical decision making (see chart for details).  Clinical Course     Pt presented to the ED with complaints of abdominal pain and weakness.  Sx ongoing for the last week.  Seen in the ED previously with negative workup.    Sx not getting better so she returned to the ED.  ED workup reassuring.  CXR abnormal but she has chronic increased interstitial marking.  CT scan of abdomen without acute changes.  Could be a viral illness.  Will dc home.  Follow up with PCP  Final Clinical Impressions(s) / ED Diagnoses   Final diagnoses:  Generalized weakness  Abdominal pain, unspecified abdominal location    New Prescriptions New Prescriptions   No medications on file     Dorie Rank, MD 09/06/16 1706

## 2016-09-06 NOTE — ED Notes (Signed)
Pt complaining of shaking while sitting on the toilet. Informed Dr. Tomi Bamberger and Chester Holstein - RN.

## 2016-09-06 NOTE — ED Notes (Signed)
While starting IV patient and discussing POC discovered that patient may have CT contrast allergy, EDP made aware

## 2016-09-06 NOTE — ED Notes (Signed)
Patient reports constant lower abdominal pain associated with nausea, no vomitting/diarrhea or urinary symptoms associated with this pain. States that she was seen and evaluated on Monday for same with no diagnosis. States that her pain has continued, reports severe discomfort and tenderness on palpation to lower abdominal region.

## 2016-09-07 ENCOUNTER — Ambulatory Visit (INDEPENDENT_AMBULATORY_CARE_PROVIDER_SITE_OTHER): Payer: Medicare Other | Admitting: Pharmacist Clinician (PhC)/ Clinical Pharmacy Specialist

## 2016-09-07 DIAGNOSIS — I480 Paroxysmal atrial fibrillation: Secondary | ICD-10-CM

## 2016-09-07 DIAGNOSIS — Z7901 Long term (current) use of anticoagulants: Secondary | ICD-10-CM

## 2016-09-12 ENCOUNTER — Ambulatory Visit (INDEPENDENT_AMBULATORY_CARE_PROVIDER_SITE_OTHER): Payer: Medicare Other | Admitting: Family Medicine

## 2016-09-12 VITALS — BP 118/78 | HR 81 | Temp 98.2°F | Resp 17 | Ht 63.5 in | Wt 199.0 lb

## 2016-09-12 DIAGNOSIS — I952 Hypotension due to drugs: Secondary | ICD-10-CM

## 2016-09-12 DIAGNOSIS — J410 Simple chronic bronchitis: Secondary | ICD-10-CM

## 2016-09-12 DIAGNOSIS — I471 Supraventricular tachycardia: Secondary | ICD-10-CM

## 2016-09-12 DIAGNOSIS — I480 Paroxysmal atrial fibrillation: Secondary | ICD-10-CM

## 2016-09-12 DIAGNOSIS — B372 Candidiasis of skin and nail: Secondary | ICD-10-CM

## 2016-09-12 DIAGNOSIS — I10 Essential (primary) hypertension: Secondary | ICD-10-CM | POA: Diagnosis not present

## 2016-09-12 DIAGNOSIS — Z1231 Encounter for screening mammogram for malignant neoplasm of breast: Secondary | ICD-10-CM

## 2016-09-12 NOTE — Progress Notes (Signed)
Subjective:    Patient ID: Laurie Horton, female    DOB: 1946/03/04, 70 y.o.   MRN: [id number]  09/12/2016  Follow-up (hospital follow up.)   HPI This 70 y.o. female presents for evaluation for TRANSITION INTO CARE for recent ED visit for generalized weakness, nausea, vomiting, diarrhea. Pt evaluated by Dr. Neva Seat on 09/01/16 for nausea and vomiting and diarrhea and generalized weakness onset two weeks after receiving flu vaccine in office; warranted ED evaluation and transported by EMS. S/p CXR in ED for evaluation of cough.  S/p Troponin, u/a, PT/INR, CBC, CMET, Lipase in ED all negative.  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 Mild diffuse fatty liver with questionable irregular liver surface.  Administered iv fluids.   Still sick; no longer vomiting or diarrhea; still having abdominal pain. Last vomiting 09/05/16.  Last diarrhea episode on 09/01/2016.   Cut out oxybutynin at advice of neighbor to see if lower abdominal pain would improve; still having lower abdominal pain. Now having constipation; taking Miralax; ate some spinach.  Has not eaten for one week and still weighs the same.  Had a blow out one week ago after eating spinach.  Then on 09/06/16 neighbor called after hours line due to dizziness, weakness, low blood pressure.  Blood pressure was 80/50 the morning that called; it increased to 110/60. Blood pressure is low every morning; walks dog at 4:30 every morning.  Able to walk 1/2 block this morning; this was progress. Has been going into atrial fibrillation a lot.  After saw Dr. Sw[place]il[place] and stopped Norvasc.  Has been decreasing blood pressure medications due to low blood pressure every morning.  This issue has been occurring for the past year.  Cannot stay in NSR.  Shows cardiology BP readings every visit.  Takes Metoprolol ER every morning; only takes Metoprolol if BP > 110 systolic.  Cut out Gabapentin because felt like getting  too much drugs at night for the past two weeks.  Taking Trazodone 100mg  qhs; taking Vicodin at night as well.  Eats a small snack.  Dr. Jamelle Rushing management was prescribing Gabapentin; he recommended PCP fill.  Cannot perform any injections due to potential bleed with Coumadin.  Requested referral to pain clinic; has been maintained on hydrocodone since 2006; taking hydrocodone bid.  Took hydrocodone at 3:00am.  Dr. Kathi Ludwig prescribes Trazodone for headaches and insomnia, diazepam.  Rarely takes diazepam. Also taking Pristiq for mood disorder by Dr. Florentina Jenny of psychiatry.  Previously saw Dr. Betti Cruz.    Immunization History  Administered Date(s) Administered  . Influenza Split 08/03/2011, 08/01/2012  . Influenza,inj,Quad PF,36+ Mos 08/14/2013, 09/03/2014, 09/04/2015, 08/18/2016  . Pneumococcal Conjugate-13 10/01/2014  . Pneumococcal Polysaccharide-23 09/03/1999, 06/02/2006, 08/14/2013  . Td 11/02/1994  . Tdap 08/18/2016    109/81 on L wrist cuff.  Sees Dr. Swaziland 10/16/16.  Heart rate at home fluctuates.  Feels atrial fibrillation when goes into irregular rhythm.  Last elevated pulse to EMS 09/06/16; elevated to 140.  Pulse usually runs 61-91.  Over a two week period, had two readings over 110.    Stopped Norvasc unsure.    L breast tenderness; s/p repeat biopsies at area.  Has clip in area.  Still having odor along B inguinal folds; only used Terconazole cream once and then got acutely ill. Has odor when pulls down underwear.  Review of Systems  Constitutional: Positive for fatigue. Negative for chills, diaphoresis and fever.  HENT: Positive for congestion. Negative for  ear pain, sinus pressure, sneezing and sore throat.   Eyes: Negative for visual disturbance.  Respiratory: Negative for cough and shortness of breath.   Cardiovascular: Positive for palpitations. Negative for chest pain and leg swelling.  Gastrointestinal: Positive for abdominal pain. Negative for abdominal distention, anal  bleeding, blood in stool, constipation, diarrhea, nausea, rectal pain and vomiting.  Endocrine: Negative for cold intolerance, heat intolerance, polydipsia, polyphagia and polyuria.  Musculoskeletal: Positive for arthralgias and back pain.  Neurological: Negative for dizziness, tremors, seizures, syncope, facial asymmetry, speech difficulty, weakness, light-headedness, numbness and headaches.    Past Medical History:  Diagnosis Date  . Complication of anesthesia    various issues with oxygen  saturations post op  . COPD (chronic obstructive pulmonary disease) (HCC)   . Depression   . Diverticulitis   . DVT (deep venous thrombosis) (HCC)   . Fibromyalgia   . GERD (gastroesophageal reflux disease)   . Hiatal hernia   . History of deviated nasal septum    left- side  . HTN (hypertension)   . Hyperlipidemia   . Obesity   . On supplemental oxygen therapy    concentrator at night @ 1.5 l/m or when sleeps. O2 Sat niormally 87.  . Osteoporosis   . PAT (paroxysmal atrial tachycardia) (HCC)   . PFO (patent foramen ovale)   . PULMONARY NODULE, LEFT LOWER LOBE 10/14/2009   5mm LLL nodule dec 2010. Stable and 4mm in Oct 2012. No further fu  . Right middle lobe pneumonia (HCC) 07/24/2011   First noted at admit 07/10/11. Persists on cxr 07/22/11. Cleared on CT 08/24/11. No further followup  . Stroke (HCC)   . TOBACCO ABUSE 06/04/2009   Past Surgical History:  Procedure Laterality Date  . CARDIOVASCULAR STRESS TEST  12/26/2004   EF 74%. NO EVIDENCE OF ISCHEMIA  . ESOPHAGOGASTRODUODENOSCOPY (EGD) WITH PROPOFOL N/A 04/15/2015   Procedure: ESOPHAGOGASTRODUODENOSCOPY (EGD) WITH PROPOFOL;  Surgeon: Carman Ching, MD;  Location: [facility];  Service: Endoscopy;  Laterality: N/A;  . KNEE ARTHROSCOPY  2000   left  . LAPAROSCOPIC CHOLECYSTECTOMY  04-16-2010   [place]  . MOUTH SURGERY     03-26-15 multiple extractions stitches remains  . TOTAL ABDOMINAL HYSTERECTOMY     post op needed oxygen was told  "she gave them a scare"  . TUBAL LIGATION    . US ECHOCARDIOGRAPHY  11/20/2009   EF 55-60%   Allergies  Allergen Reactions  . Alprazolam     REACTION: stops breathing  . Pseudoephedrine Hcl Er Shortness Of Breath  . Budesonide-Formoterol Fumarate     bliusters  . Aciphex [Rabeprazole Sodium]     rash  . Avelox [Moxifloxacin Hcl In Nacl]     Stomach cramps.   . Esomeprazole Magnesium     REACTION: "bouncing off walls"  . Flonase [Fluticasone Propionate]   . Iodine     REACTION: swelling in throat  . Loratadine     claritin D causes shaking  . Lotrimin [Clotrimazole] Other (See Comments)    blisters  . Lunesta [Eszopiclone]     REACTION: "slept for a week"  . Other     Glue from ekg/heart monitor leads --rash +  Any MYCINS  . Oxcarbazepine Other (See Comments)    Causes deep sleep and dizziness  . Statins Other (See Comments)  . Zolpidem Tartrate     REACTION: "slept for a week"  . Bextra [Valdecoxib] Rash  . Ceclor [Cefaclor] Rash  . Covera-Hs [Verapamil Hcl] Palpitations  .  Dicyclomine Hcl Rash  . Tessalon Perles Rash   Current Outpatient Prescriptions  Medication Sig Dispense Refill  . diazepam (VALIUM) 2 MG tablet Take 2 mg by mouth every 6 (six) hours as needed for anxiety.    Marland Kitchen HYDROcodone-acetaminophen (NORCO/VICODIN) 5-325 MG tablet Take 1 tablet by mouth 2 (two) times daily.     Marland Kitchen ipratropium (ATROVENT) 0.02 % nebulizer solution Inhale 1 mL into the lungs every 4 (four) hours.    . metoprolol succinate (TOPROL XL) 25 MG 24 hr tablet Take 1 tablet (25 mg total) by mouth daily. 30 tablet 6  . montelukast (SINGULAIR) 10 MG tablet TAKE 1 TABLET BY MOUTH AT BEDTIME (Patient taking differently: TAKE 1 TABLET BY MOUTH AT DAILY) 90 tablet 0  . ondansetron (ZOFRAN ODT) 8 MG disintegrating tablet Take 1 tablet (8 mg total) by mouth every 8 (eight) hours as needed for nausea or vomiting. 20 tablet 0  . OXYGEN Inhale 1.5 L into the lungs. For naps and sleep.    .  pantoprazole (PROTONIX) 40 MG tablet Take 1 tablet by mouth every morning.     . polyethylene glycol (MIRALAX / GLYCOLAX) packet Take 17 g by mouth daily as needed for mild constipation.     Marland Kitchen PRISTIQ 50 MG 24 hr tablet TAKE 1 TABLET BY MOUTH EVERY DAY 30 tablet 0  . rosuvastatin (CRESTOR) 5 MG tablet Take 5 mg by mouth once a week. Sunday  3  . tiZANidine (ZANAFLEX) 2 MG tablet Take by mouth every 6 (six) hours as needed for muscle spasms.    . traZODone (DESYREL) 100 MG tablet Take 1 tablet (100 mg total) by mouth at bedtime. 30 tablet 1  . warfarin (COUMADIN) 5 MG tablet Take 1/2 to 1 tablet by mouth daily or as directed by coumadin clinic (Patient taking differently: Take 2.5-5 mg by mouth daily. Take 2.5 on Tues, Wed, Thur, Sat, Sun and 5mg  on Mon and Fri) 30 tablet 1   No current facility-administered medications for this visit.    Social History   Social History  . Marital status: Divorced    Spouse name: N/A  . Number of children: 2  . Years of education: N/A   Occupational History  . disability    Social History Main Topics  . Smoking status: Former Smoker    Quit date: 06/02/2010  . Smokeless tobacco: Never Used  . Alcohol use No  . Drug use: No  . Sexual activity: Yes   Other Topics Concern  . Not on file   Social History Narrative  . No narrative on file   Family History  Problem Relation Age of Onset  . Dementia Mother   . Diabetes Mother   . Alzheimer's disease Mother   . Heart attack Brother 23  . Heart attack Father   . Schizophrenia Sister   . Diabetes Sister   . Tremor Sister        Objective:    BP 118/78 (BP Location: Left Arm, Cuff Size: Large)   Pulse 81   Temp 98.2 F (36.8 C) (Oral)   Resp 17   Ht 5' 3.5" (1.613 m)   Wt 199 lb (90.3 kg)   SpO2 (!) 89%   BMI 34.70 kg/m  Physical Exam  Constitutional: She is oriented to person, place, and time. She appears well-developed and well-nourished. No distress.  HENT:  Head: Normocephalic and  atraumatic.  Right Ear: External ear normal.  Left Ear: External ear normal.  Nose: Nose normal.  Mouth/Throat: Oropharynx is clear and moist.  Eyes: Conjunctivae and EOM are normal. Pupils are equal, round, and reactive to light.  Neck: Normal range of motion. Neck supple. Carotid bruit is not present. No thyromegaly present.  Cardiovascular: Normal rate, normal heart sounds and intact distal pulses.  An irregularly irregular rhythm present. Exam reveals no gallop and no friction rub.   No murmur heard. Pulmonary/Chest: Effort normal and breath sounds normal. She has no wheezes. She has no rales. Left breast exhibits tenderness.  +TTP along L lateral breast at 2 o'clock area with palpable tenderness.  Abdominal: Soft. Bowel sounds are normal. She exhibits no distension and no mass. There is generalized tenderness. There is no rebound and no guarding.  Lymphadenopathy:    She has no cervical adenopathy.  Neurological: She is alert and oriented to person, place, and time. No cranial nerve deficit. She exhibits normal muscle tone. Coordination normal.  Skin: Skin is warm and dry. No rash noted. She is not diaphoretic. No erythema. No pallor.  Psychiatric: She has a normal mood and affect. Her behavior is normal. Judgment and thought content normal.   Results for orders placed or performed during the hospital encounter of 09/06/16  Lipase, blood  Result Value Ref Range   Lipase 19 11 - 51 U/L  Comprehensive metabolic panel  Result Value Ref Range   Sodium 135 135 - 145 mmol/L   Potassium 3.9 3.5 - 5.1 mmol/L   Chloride 100 (L) 101 - 111 mmol/L   CO2 26 22 - 32 mmol/L   Glucose, Bld 124 (H) 65 - 99 mg/dL   BUN 10 6 - 20 mg/dL   Creatinine, Ser 6.96 0.44 - 1.00 mg/dL   Calcium 9.4 8.9 - 29.5 mg/dL   Total Protein 6.0 (L) 6.5 - 8.1 g/dL   Albumin 3.7 3.5 - 5.0 g/dL   AST 22 15 - 41 U/L   ALT 18 14 - 54 U/L   Alkaline Phosphatase 51 38 - 126 U/L   Total Bilirubin 0.7 0.3 - 1.2 mg/dL    GFR calc non Af Amer >60 >60 mL/min   GFR calc Af Amer >60 >60 mL/min   Anion gap 9 5 - 15  CBC  Result Value Ref Range   WBC 8.0 4.0 - 10.5 K/uL   RBC 3.84 (L) 3.87 - 5.11 MIL/uL   Hemoglobin 11.3 (L) 12.0 - 15.0 g/dL   HCT 28.4 (L) 13.2 - 44.0 %   MCV 91.1 78.0 - 100.0 fL   MCH 29.4 26.0 - 34.0 pg   MCHC 32.3 30.0 - 36.0 g/dL   RDW 10.2 72.5 - 36.6 %   Platelets 264 150 - 400 K/uL  Urinalysis, Routine w reflex microscopic  Result Value Ref Range   Color, Urine YELLOW YELLOW   APPearance CLEAR CLEAR   Specific Gravity, Urine 1.011 1.005 - 1.030   pH 8.5 (H) 5.0 - 8.0   Glucose, UA NEGATIVE NEGATIVE mg/dL   Hgb urine dipstick NEGATIVE NEGATIVE   Bilirubin Urine NEGATIVE NEGATIVE   Ketones, ur NEGATIVE NEGATIVE mg/dL   Protein, ur NEGATIVE NEGATIVE mg/dL   Nitrite NEGATIVE NEGATIVE   Leukocytes, UA TRACE (A) NEGATIVE  Protime-INR  Result Value Ref Range   Prothrombin Time 25.8 (H) 11.4 - 15.2 seconds   INR 2.31   Troponin I  Result Value Ref Range   Troponin I <0.03 <0.03 ng/mL  Urine microscopic-add on  Result Value Ref Range   Squamous  Epithelial / LPF 6-30 (A) NONE SEEN   WBC, UA 0-5 0 - 5 WBC/hpf   RBC / HPF NONE SEEN 0 - 5 RBC/hpf   Bacteria, UA NONE SEEN NONE SEEN       Assessment & Plan:   1. Hypotension due to drugs   2. PAF (paroxysmal atrial fibrillation) (HCC)   3. Essential hypertension   4. Encounter for screening mammogram for breast cancer   5. PAT (paroxysmal atrial tachycardia) (HCC)   6. Simple chronic bronchitis (HCC)   7. Candidiasis of skin    -suffering with persistent hypotension every morning.  No improvement with holding gabapentin or oxybutynin.  Recommend decreasing Metoprolol ER 25mg  to 1/2 tablet daily; follow-up with Dr. [place] in one month. Continue to monitor BP, pulse daily. -extensive labs in ED in past week and all stable; urine in ED last week also normal.  S/p recent CT abdomen and pelvis negative for acute process;  nausea/vomitting/diarrhea now resolved. Recommend BRAT diet, hydration. -order mammogram.  -restart Terconazole cream bid to B inguinal folds.   Orders Placed This Encounter  Procedures  . MM SCREENING BREAST TOMO BILATERAL    Standing Status:   Future    Standing Expiration Date:   11/13/2017    Scheduling Instructions:     [facility]    Order Specific Question:   Reason for Exam (SYMPTOM  OR DIAGNOSIS REQUIRED)    Answer:   annual screening    Order Specific Question:   Preferred imaging location?    Answer:   External   Meds ordered this encounter  Medications  . tiZANidine (ZANAFLEX) 2 MG tablet    Sig: Take by mouth every 6 (six) hours as needed for muscle spasms.  . diazepam (VALIUM) 2 MG tablet    Sig: Take 2 mg by mouth every 6 (six) hours as needed for anxiety.    No Follow-up on file.   Grissel Tyrell Paulita Fujita, M.D. [facility] [address] [place], [place]  [id number] [phone] phone [phone] fax

## 2016-09-12 NOTE — Patient Instructions (Addendum)
  1. DECREASE METOPROLOL ER 25MG   1/2 TABLET DAILY. 2. CONTINUE TO MONITOR BLOOD PRESSURE AND PULSE EVERY MORNING.   IF you received an x-ray today, you will receive an invoice from [facility]. Please contact [facility] at [phone] with questions or concerns regarding your invoice.   IF you received labwork today, you will receive an invoice from [employer]. Please contact [facility] at [phone] with questions or concerns regarding your invoice.   Our billing staff will not be able to assist you with questions regarding bills from these companies.  You will be contacted with the lab results as soon as they are available. The fastest way to get your results is to activate your My Chart account. Instructions are located on the last page of this paperwork. If you have not heard from [place] regarding the results in 2 weeks, please contact this office.

## 2016-09-22 ENCOUNTER — Ambulatory Visit (INDEPENDENT_AMBULATORY_CARE_PROVIDER_SITE_OTHER): Payer: Medicare Other | Admitting: Pharmacist

## 2016-09-22 DIAGNOSIS — I480 Paroxysmal atrial fibrillation: Secondary | ICD-10-CM | POA: Diagnosis not present

## 2016-09-22 DIAGNOSIS — Z7901 Long term (current) use of anticoagulants: Secondary | ICD-10-CM

## 2016-09-22 LAB — POCT INR: INR: 3

## 2016-09-23 ENCOUNTER — Other Ambulatory Visit (HOSPITAL_COMMUNITY): Payer: Self-pay | Admitting: Psychiatry

## 2016-09-23 DIAGNOSIS — F0630 Mood disorder due to known physiological condition, unspecified: Secondary | ICD-10-CM

## 2016-09-29 ENCOUNTER — Encounter: Payer: Self-pay | Admitting: Family Medicine

## 2016-09-29 LAB — HM MAMMOGRAPHY

## 2016-10-01 ENCOUNTER — Ambulatory Visit (INDEPENDENT_AMBULATORY_CARE_PROVIDER_SITE_OTHER): Payer: Medicare Other | Admitting: Psychiatry

## 2016-10-01 ENCOUNTER — Encounter (HOSPITAL_COMMUNITY): Payer: Self-pay | Admitting: Psychiatry

## 2016-10-01 DIAGNOSIS — Z79899 Other long term (current) drug therapy: Secondary | ICD-10-CM | POA: Diagnosis not present

## 2016-10-01 DIAGNOSIS — F0630 Mood disorder due to known physiological condition, unspecified: Secondary | ICD-10-CM | POA: Diagnosis not present

## 2016-10-01 MED ORDER — TRAZODONE HCL 100 MG PO TABS: 100.0000 mg | ORAL_TABLET | Freq: Every day | ORAL | 2 refills | Status: DC

## 2016-10-01 MED ORDER — DESVENLAFAXINE SUCCINATE ER 50 MG PO TB24: 50.0000 mg | ORAL_TABLET | Freq: Every day | ORAL | 2 refills | Status: DC

## 2016-10-01 NOTE — Progress Notes (Signed)
[facility] [phone] Progress Note  Laurie Horton [id number] 70 y.o.  10/01/2016 10:13 AM  Chief Complaint:  I was sick and admitted multiple times in the emergency room.  I'm very tired and recovering from my sickness.  I'm seeing pain specialist who is giving me hydrocodone.  It is helping but is still have back pain.    History of Present Illness:  Laurie Horton came for her follow-up appointment.  She mentioned the past few months she was very sick and required multiple visits to the emergency room.  She was upset because in the emergency room PA give her antibiotic which causes bleeding because she was on Coumadin and that causes nosebleed.  She was not happy with her hospital stay .  She admitted lately she is feeling more sad depressed and frustrated .  She admitted feeling tired , fatigue and lack of motivation because of physical symptoms.  She is taking Pristiq and trazodone.  She rarely takes Valium.  She sleeping on and off.  She admitted some time racing thoughts but denies any feeling of hopelessness or worthlessness.  She denies any nightmares or any flashback.  She denies any paranoia, hallucination or any manic-like symptoms.  She is very happy that finally she is able to see a specialist Dr. Florene Glen and getting hydrocodone, gabapentin and muscle relaxant.  She had a quiet Thanksgiving.  Her daughter came to clean the house but she got upset with her.  Patient denies any use of illegal substances or drinking alcohol.  She wants to continue her current psychiatric medication.  She is not interested to change her try a different antidepressant.  She is not interested in counseling.  Her appetite is fair.  Her energy level is low.  Her vital signs are stable.  Suicidal Ideation: No Plan Formed: No Patient has means to carry out plan: No  Homicidal Ideation: No Plan Formed: No Patient has means to carry out plan: No  Medical History; Patient has hypertension, fibromyalgia,  scoliosis, headache, history of stroke and memory impairment.  She has loss of peripheral vision and weakness on the right side which is minimal.  She has chronic back pain.  Her primary care physician is Dr. Everlene Farrier.   Family History; Patient denies any family history of psychiatric illness.  However her son has history of drug and alcohol problem.  Substance Abuse History; Patient denies any history of drug use.  Past Psychiatric History/Hospitalization(s) Patient denies any previous history of psychiatric inpatient treatment, suicidal attempt, paranoia, hallucination or any mania. She started seeing psychiatrist in 2006 after she had a stroke. She had tried Cymbalta and Celexa in the past. She also tried Ambien, Lunesta and Xanax but developed allergies and side effects Anxiety: Yes Bipolar Disorder: No Depression: Yes Mania: No Psychosis: No Schizophrenia: No Personality Disorder: No Hospitalization for psychiatric illness: No History of Electroconvulsive Shock Therapy: No Prior Suicide Attempts: No   Review of Systems  Constitutional: Positive for malaise/fatigue.  HENT: Negative.   Respiratory: Negative.   Cardiovascular: Negative.   Gastrointestinal: Positive for nausea.  Musculoskeletal: Positive for back pain and joint pain.  Skin: Negative.   Neurological: Negative.   Psychiatric/Behavioral: Positive for depression. The patient is nervous/anxious.        Mild memory impairment    Psychiatric: Agitation: No Hallucination: No Depressed Mood: Yes Insomnia: No Hypersomnia: No Altered Concentration: No Feels Worthless: No Grandiose Ideas: No Belief In Special Powers: No New/Increased Substance Abuse: No Compulsions: No  Neurologic: Headache: No Seizure: No Paresthesias: No  Outpatient Encounter Prescriptions as of 10/01/2016  Medication Sig  . desvenlafaxine (PRISTIQ) 50 MG 24 hr tablet Take 1 tablet (50 mg total) by mouth daily.  . diazepam (VALIUM) 2 MG  tablet Take 2 mg by mouth every 6 (six) hours as needed for anxiety.  . gabapentin (NEURONTIN) 100 MG capsule   . HYDROcodone-acetaminophen (NORCO/VICODIN) 5-325 MG tablet Take 1 tablet by mouth 2 (two) times daily.   Marland Kitchen ipratropium (ATROVENT) 0.02 % nebulizer solution Inhale 1 mL into the lungs every 4 (four) hours.  . metoprolol succinate (TOPROL XL) 25 MG 24 hr tablet Take 1 tablet (25 mg total) by mouth daily.  . montelukast (SINGULAIR) 10 MG tablet TAKE 1 TABLET BY MOUTH AT BEDTIME (Patient taking differently: TAKE 1 TABLET BY MOUTH AT DAILY)  . ondansetron (ZOFRAN ODT) 8 MG disintegrating tablet Take 1 tablet (8 mg total) by mouth every 8 (eight) hours as needed for nausea or vomiting.  Marland Kitchen oxybutynin (DITROPAN-XL) 10 MG 24 hr tablet   . OXYGEN Inhale 1.5 L into the lungs. For naps and sleep.  . pantoprazole (PROTONIX) 40 MG tablet Take 1 tablet by mouth every morning.   . polyethylene glycol (MIRALAX / GLYCOLAX) packet Take 17 g by mouth daily as needed for mild constipation.   . rosuvastatin (CRESTOR) 5 MG tablet Take 5 mg by mouth once a week. Sunday  . tiZANidine (ZANAFLEX) 2 MG tablet Take by mouth every 6 (six) hours as needed for muscle spasms.  . traZODone (DESYREL) 100 MG tablet Take 1 tablet (100 mg total) by mouth at bedtime.  Marland Kitchen warfarin (COUMADIN) 5 MG tablet Take 1/2 to 1 tablet by mouth daily or as directed by coumadin clinic (Patient taking differently: Take 2.5-5 mg by mouth daily. Take 2.5 on Tues, Wed, Thur, Sat, Sun and 84m on Mon and Fri)  . [DISCONTINUED] PRISTIQ 50 MG 24 hr tablet TAKE 1 TABLET BY MOUTH EVERY DAY  . [DISCONTINUED] traZODone (DESYREL) 100 MG tablet Take 1 tablet (100 mg total) by mouth at bedtime.   No facility-administered encounter medications on file as of 10/01/2016.     Recent Results (from the past 2160 hour(s))  POCT INR     Status: None   Collection Time: 07/14/16  8:11 AM  Result Value Ref Range   INR 3.7   POCT Microscopic Urinalysis ([facility])      Status: None   Collection Time: 07/31/16  1:59 PM  Result Value Ref Range   WBC,UR,HPF,POC None None WBC/hpf   RBC,UR,HPF,POC None None RBC/hpf   Bacteria None None, Too numerous to count   Mucus Absent Absent   Epithelial Cells, UR Per Microscopy None None, Too numerous to count cells/hpf  POCT urinalysis dipstick     Status: Abnormal   Collection Time: 07/31/16  2:00 PM  Result Value Ref Range   Color, UA yellow yellow   Clarity, UA clear clear   Glucose, UA negative negative   Bilirubin, UA negative negative   Ketones, POC UA negative negative   Spec Grav, UA 1.015    Blood, UA trace-intact (A) negative   pH, UA 6.5    Protein Ur, POC negative negative   Urobilinogen, UA 1.0    Nitrite, UA Negative Negative   Leukocytes, UA Trace (A) Negative  Herpes simplex virus culture     Status: None   Collection Time: 07/31/16  2:19 PM  Result Value Ref Range   Organism ID,  Bacteria HERPES SIMPLEX VIRUS TYPE 2 DETECTED.   POCT Wet + KOH Prep     Status: Abnormal   Collection Time: 07/31/16  2:29 PM  Result Value Ref Range   Yeast by KOH Absent Present, Absent   Yeast by wet prep Absent Present, Absent   WBC by wet prep Moderate (A) None, Few, Too numerous to count   Clue Cells Wet Prep HPF POC Few (A) None, Too numerous to count   Trich by wet prep Absent Present, Absent   Bacteria Wet Prep HPF POC None None, Few, Too numerous to count   Epithelial Cells By [employer] Pref ([facility]) Moderate (A) None, Few, Too numerous to count   RBC,UR,HPF,POC None None RBC/hpf  Urine culture     Status: None   Collection Time: 07/31/16  2:30 PM  Result Value Ref Range   Organism ID, Bacteria      Multiple organisms present,each less than 10,000 CFU/mL. These organisms,commonly found on external and internal genitalia,are considered colonizers. No further testing performed.   POCT CBC     Status: Abnormal   Collection Time: 07/31/16  3:03 PM  Result Value Ref Range   WBC 14.4 (A) 4.6 - 10.2 K/uL    Lymph, poc 1.9 0.6 - 3.4   POC LYMPH PERCENT 12.9 10 - 50 %L   MID (cbc) 0.3 0 - 0.9   POC MID % 2.1 0 - 12 %M   POC Granulocyte 12.2 (A) 2 - 6.9   Granulocyte percent 85.0 (A) 37 - 80 %G   RBC 4.47 4.04 - 5.48 M/uL   Hemoglobin 14.5 12.2 - 16.2 g/dL   HCT, POC 41.0 37.7 - 47.9 %   MCV 91.5 80 - 97 fL   MCH, POC 32.5 (A) 27 - 31.2 pg   MCHC 35.5 (A) 31.8 - 35.4 g/dL   RDW, POC 13.2 %   Platelet Count, POC 224 142 - 424 K/uL   MPV 8.3 0 - 99.8 fL  CBC with Differential     Status: Abnormal   Collection Time: 08/03/16  4:40 PM  Result Value Ref Range   WBC 8.3 4.0 - 10.5 K/uL   RBC 3.34 (L) 3.87 - 5.11 MIL/uL   Hemoglobin 10.6 (L) 12.0 - 15.0 g/dL   HCT 31.5 (L) 36.0 - 46.0 %   MCV 94.3 78.0 - 100.0 fL   MCH 31.7 26.0 - 34.0 pg   MCHC 33.7 30.0 - 36.0 g/dL   RDW 13.0 11.5 - 15.5 %   Platelets 214 150 - 400 K/uL   Neutrophils Relative % 76 %   Neutro Abs 6.4 1.7 - 7.7 K/uL   Lymphocytes Relative 16 %   Lymphs Abs 1.3 0.7 - 4.0 K/uL   Monocytes Relative 7 %   Monocytes Absolute 0.5 0.1 - 1.0 K/uL   Eosinophils Relative 1 %   Eosinophils Absolute 0.0 0.0 - 0.7 K/uL   Basophils Relative 0 %   Basophils Absolute 0.0 0.0 - 0.1 K/uL  Basic metabolic panel     Status: Abnormal   Collection Time: 08/03/16  4:40 PM  Result Value Ref Range   Sodium 137 135 - 145 mmol/L   Potassium 2.7 (LL) 3.5 - 5.1 mmol/L    Comment: CRITICAL RESULT CALLED TO, READ BACK BY AND VERIFIED WITH: K.HALL RN AT 0814 ON 08/03/16 BY S.VANHOORNE    Chloride 110 101 - 111 mmol/L   CO2 21 (L) 22 - 32 mmol/L   Glucose, Bld 101 (  H) 65 - 99 mg/dL   BUN 14 6 - 20 mg/dL   Creatinine, Ser 0.57 0.44 - 1.00 mg/dL   Calcium 7.0 (L) 8.9 - 10.3 mg/dL   GFR calc non Af Amer >60 >60 mL/min   GFR calc Af Amer >60 >60 mL/min    Comment: (NOTE) The eGFR has been calculated using the CKD EPI equation. This calculation has not been validated in all clinical situations. eGFR's persistently <60 mL/min signify possible  Chronic Kidney Disease.    Anion gap 6 5 - 15  Protime-INR     Status: Abnormal   Collection Time: 08/03/16  4:40 PM  Result Value Ref Range   Prothrombin Time 46.4 (H) 11.4 - 15.2 seconds   INR 4.81 (HH)     Comment: REPEATED TO VERIFY CRITICAL RESULT CALLED TO, READ BACK BY AND VERIFIED WITH: K HALL AT 1736 ON 10.02.2017 BY NBROOKS   CBC     Status: None   Collection Time: 08/04/16  5:13 AM  Result Value Ref Range   WBC 9.5 4.0 - 10.5 K/uL   RBC 3.93 3.87 - 5.11 MIL/uL   Hemoglobin 12.5 12.0 - 15.0 g/dL   HCT 36.4 36.0 - 46.0 %   MCV 92.6 78.0 - 100.0 fL   MCH 31.8 26.0 - 34.0 pg   MCHC 34.3 30.0 - 36.0 g/dL   RDW 13.1 11.5 - 15.5 %   Platelets 276 150 - 400 K/uL  Protime-INR     Status: Abnormal   Collection Time: 08/04/16  5:13 AM  Result Value Ref Range   Prothrombin Time 17.9 (H) 11.4 - 15.2 seconds   INR 5.37   Basic metabolic panel     Status: Abnormal   Collection Time: 08/04/16  5:13 AM  Result Value Ref Range   Sodium 133 (L) 135 - 145 mmol/L   Potassium 4.0 3.5 - 5.1 mmol/L    Comment: DELTA CHECK NOTED NO VISIBLE HEMOLYSIS    Chloride 100 (L) 101 - 111 mmol/L   CO2 25 22 - 32 mmol/L   Glucose, Bld 128 (H) 65 - 99 mg/dL   BUN 15 6 - 20 mg/dL   Creatinine, Ser 0.73 0.44 - 1.00 mg/dL   Calcium 9.5 8.9 - 10.3 mg/dL    Comment: DELTA CHECK NOTED   GFR calc non Af Amer >60 >60 mL/min   GFR calc Af Amer >60 >60 mL/min    Comment: (NOTE) The eGFR has been calculated using the CKD EPI equation. This calculation has not been validated in all clinical situations. eGFR's persistently <60 mL/min signify possible Chronic Kidney Disease.    Anion gap 8 5 - 15  CBC     Status: Abnormal   Collection Time: 08/05/16  5:37 AM  Result Value Ref Range   WBC 7.8 4.0 - 10.5 K/uL   RBC 3.86 (L) 3.87 - 5.11 MIL/uL   Hemoglobin 12.2 12.0 - 15.0 g/dL   HCT 34.4 (L) 36.0 - 46.0 %   MCV 89.1 78.0 - 100.0 fL   MCH 31.6 26.0 - 34.0 pg   MCHC 35.5 30.0 - 36.0 g/dL   RDW  12.8 11.5 - 15.5 %   Platelets 244 150 - 400 K/uL  Basic metabolic panel     Status: Abnormal   Collection Time: 08/05/16  5:37 AM  Result Value Ref Range   Sodium 131 (L) 135 - 145 mmol/L   Potassium 3.7 3.5 - 5.1 mmol/L   Chloride 96 (L) 101 -  111 mmol/L   CO2 26 22 - 32 mmol/L   Glucose, Bld 114 (H) 65 - 99 mg/dL   BUN 14 6 - 20 mg/dL   Creatinine, Ser 0.90 0.44 - 1.00 mg/dL   Calcium 9.5 8.9 - 10.3 mg/dL   GFR calc non Af Amer >60 >60 mL/min   GFR calc Af Amer >60 >60 mL/min    Comment: (NOTE) The eGFR has been calculated using the CKD EPI equation. This calculation has not been validated in all clinical situations. eGFR's persistently <60 mL/min signify possible Chronic Kidney Disease.    Anion gap 9 5 - 15  Protime-INR     Status: None   Collection Time: 08/05/16  5:37 AM  Result Value Ref Range   Prothrombin Time 13.8 11.4 - 15.2 seconds   INR 1.05   Protime-INR     Status: None   Collection Time: 08/06/16  5:22 AM  Result Value Ref Range   Prothrombin Time 13.8 11.4 - 15.2 seconds   INR 1.05   Troponin I     Status: None   Collection Time: 08/06/16  2:30 PM  Result Value Ref Range   Troponin I <0.03 <0.03 ng/mL  POCT INR     Status: None   Collection Time: 08/10/16  8:01 AM  Result Value Ref Range   INR 2.2   POCT INR     Status: None   Collection Time: 08/17/16 10:20 AM  Result Value Ref Range   INR 1.3   CBC with Differential/Platelet     Status: Abnormal   Collection Time: 08/18/16  8:43 AM  Result Value Ref Range   WBC 6.7 3.8 - 10.8 K/uL   RBC 3.62 (L) 3.80 - 5.10 MIL/uL   Hemoglobin 11.1 (L) 11.7 - 15.5 g/dL   HCT 34.0 (L) 35.0 - 45.0 %   MCV 93.9 80.0 - 100.0 fL   MCH 30.7 27.0 - 33.0 pg   MCHC 32.6 32.0 - 36.0 g/dL   RDW 14.4 11.0 - 15.0 %   Platelets 287 140 - 400 K/uL   MPV 9.9 7.5 - 12.5 fL   Neutro Abs 4,824 1,500 - 7,800 cells/uL   Lymphs Abs 1,340 850 - 3,900 cells/uL   Monocytes Absolute 469 200 - 950 cells/uL   Eosinophils Absolute  67 15 - 500 cells/uL   Basophils Absolute 0 0 - 200 cells/uL   Neutrophils Relative % 72 %   Lymphocytes Relative 20 %   Monocytes Relative 7 %   Eosinophils Relative 1 %   Basophils Relative 0 %   Smear Review Criteria for review not met   Comprehensive metabolic panel     Status: Abnormal   Collection Time: 08/18/16  8:43 AM  Result Value Ref Range   Sodium 137 135 - 146 mmol/L   Potassium 3.9 3.5 - 5.3 mmol/L   Chloride 100 98 - 110 mmol/L   CO2 26 20 - 31 mmol/L   Glucose, Bld 152 (H) 65 - 99 mg/dL   BUN 11 7 - 25 mg/dL   Creat 0.91 0.60 - 0.93 mg/dL    Comment:   For patients > or = 70 years of age: The upper reference limit for Creatinine is approximately 13% higher for people identified as African-American.      Total Bilirubin 0.3 0.2 - 1.2 mg/dL   Alkaline Phosphatase 58 33 - 130 U/L   AST 10 10 - 35 U/L   ALT 16 6 - 29 U/L  Total Protein 5.6 (L) 6.1 - 8.1 g/dL   Albumin 3.7 3.6 - 5.1 g/dL   Calcium 9.0 8.6 - 10.4 mg/dL  POCT INR     Status: None   Collection Time: 08/25/16  8:10 AM  Result Value Ref Range   INR 2.0   POCT CBC     Status: Abnormal   Collection Time: 09/01/16 10:07 AM  Result Value Ref Range   WBC 6.6 4.6 - 10.2 K/uL   Lymph, poc 2.7 0.6 - 3.4   POC LYMPH PERCENT 40.7 10 - 50 %L   MID (cbc) 0.5 0 - 0.9   POC MID % 7.4 0 - 12 %M   POC Granulocyte 3.4 2 - 6.9   Granulocyte percent 51.9 37 - 80 %G   RBC 4.13 4.04 - 5.48 M/uL   Hemoglobin 11.3 (A) 12.2 - 16.2 g/dL   HCT, POC 33.0 (A) 37.7 - 47.9 %   MCV 79.9 (A) 80 - 97 fL   MCH, POC 27.4 27 - 31.2 pg   MCHC 34.3 31.8 - 35.4 g/dL   RDW, POC 15.5 %   Platelet Count, POC 287 142 - 424 K/uL   MPV 7.9 0 - 99.8 fL  POCT glucose (manual entry)     Status: Abnormal   Collection Time: 09/01/16 10:08 AM  Result Value Ref Range   POC Glucose 141 (A) 70 - 99 mg/dl  CBC with Differential     Status: Abnormal   Collection Time: 09/01/16 11:58 AM  Result Value Ref Range   WBC 5.2 4.0 - 10.5 K/uL    RBC 3.86 (L) 3.87 - 5.11 MIL/uL   Hemoglobin 11.5 (L) 12.0 - 15.0 g/dL   HCT 35.7 (L) 36.0 - 46.0 %   MCV 92.5 78.0 - 100.0 fL   MCH 29.8 26.0 - 34.0 pg   MCHC 32.2 30.0 - 36.0 g/dL   RDW 13.7 11.5 - 15.5 %   Platelets 303 150 - 400 K/uL   Neutrophils Relative % 65 %   Neutro Abs 3.4 1.7 - 7.7 K/uL   Lymphocytes Relative 21 %   Lymphs Abs 1.1 0.7 - 4.0 K/uL   Monocytes Relative 12 %   Monocytes Absolute 0.6 0.1 - 1.0 K/uL   Eosinophils Relative 2 %   Eosinophils Absolute 0.1 0.0 - 0.7 K/uL   Basophils Relative 0 %   Basophils Absolute 0.0 0.0 - 0.1 K/uL  Comprehensive metabolic panel     Status: Abnormal   Collection Time: 09/01/16 11:58 AM  Result Value Ref Range   Sodium 137 135 - 145 mmol/L   Potassium 3.8 3.5 - 5.1 mmol/L   Chloride 101 101 - 111 mmol/L   CO2 27 22 - 32 mmol/L   Glucose, Bld 122 (H) 65 - 99 mg/dL   BUN 7 6 - 20 mg/dL   Creatinine, Ser 0.96 0.44 - 1.00 mg/dL   Calcium 9.4 8.9 - 10.3 mg/dL   Total Protein 6.3 (L) 6.5 - 8.1 g/dL   Albumin 3.9 3.5 - 5.0 g/dL   AST 21 15 - 41 U/L   ALT 18 14 - 54 U/L   Alkaline Phosphatase 52 38 - 126 U/L   Total Bilirubin 0.7 0.3 - 1.2 mg/dL   GFR calc non Af Amer 59 (L) >60 mL/min   GFR calc Af Amer >60 >60 mL/min    Comment: (NOTE) The eGFR has been calculated using the CKD EPI equation. This calculation has not been validated in  all clinical situations. eGFR's persistently <60 mL/min signify possible Chronic Kidney Disease.    Anion gap 9 5 - 15  Lipase, blood     Status: None   Collection Time: 09/01/16 11:58 AM  Result Value Ref Range   Lipase 25 11 - 51 U/L  Protime-INR     Status: Abnormal   Collection Time: 09/01/16 11:58 AM  Result Value Ref Range   Prothrombin Time 25.0 (H) 11.4 - 15.2 seconds   INR 2.22   Urinalysis, Routine w reflex microscopic (not at [facility])     Status: Abnormal   Collection Time: 09/01/16 12:22 PM  Result Value Ref Range   Color, Urine YELLOW YELLOW   APPearance CLEAR CLEAR    Specific Gravity, Urine 1.010 1.005 - 1.030   pH 6.5 5.0 - 8.0   Glucose, UA NEGATIVE NEGATIVE mg/dL   Hgb urine dipstick NEGATIVE NEGATIVE   Bilirubin Urine NEGATIVE NEGATIVE   Ketones, ur NEGATIVE NEGATIVE mg/dL   Protein, ur NEGATIVE NEGATIVE mg/dL   Nitrite NEGATIVE NEGATIVE   Leukocytes, UA SMALL (A) NEGATIVE  Urine microscopic-add on     Status: Abnormal   Collection Time: 09/01/16 12:22 PM  Result Value Ref Range   Squamous Epithelial / LPF 6-30 (A) NONE SEEN   WBC, UA 6-30 0 - 5 WBC/hpf   RBC / HPF NONE SEEN 0 - 5 RBC/hpf   Bacteria, UA FEW (A) NONE SEEN  I-Stat Troponin, ED (not at [facility])     Status: None   Collection Time: 09/01/16 12:27 PM  Result Value Ref Range   Troponin i, poc 0.00 0.00 - 0.08 ng/mL   Comment 3            Comment: Due to the release kinetics of cTnI, a negative result within the first hours of the onset of symptoms does not rule out myocardial infarction with certainty. If myocardial infarction is still suspected, repeat the test at appropriate intervals.   I-Stat CG4 Lactic Acid, ED     Status: None   Collection Time: 09/01/16 12:30 PM  Result Value Ref Range   Lactic Acid, Venous 1.45 0.5 - 1.9 mmol/L  Lipase, blood     Status: None   Collection Time: 09/06/16  1:34 PM  Result Value Ref Range   Lipase 19 11 - 51 U/L  Comprehensive metabolic panel     Status: Abnormal   Collection Time: 09/06/16  1:34 PM  Result Value Ref Range   Sodium 135 135 - 145 mmol/L   Potassium 3.9 3.5 - 5.1 mmol/L   Chloride 100 (L) 101 - 111 mmol/L   CO2 26 22 - 32 mmol/L   Glucose, Bld 124 (H) 65 - 99 mg/dL   BUN 10 6 - 20 mg/dL   Creatinine, Ser 0.92 0.44 - 1.00 mg/dL   Calcium 9.4 8.9 - 10.3 mg/dL   Total Protein 6.0 (L) 6.5 - 8.1 g/dL   Albumin 3.7 3.5 - 5.0 g/dL   AST 22 15 - 41 U/L   ALT 18 14 - 54 U/L   Alkaline Phosphatase 51 38 - 126 U/L   Total Bilirubin 0.7 0.3 - 1.2 mg/dL   GFR calc non Af Amer >60 >60 mL/min   GFR calc Af Amer >60 >60 mL/min     Comment: (NOTE) The eGFR has been calculated using the CKD EPI equation. This calculation has not been validated in all clinical situations. eGFR's persistently <60 mL/min signify possible Chronic Kidney Disease.  Anion gap 9 5 - 15  CBC     Status: Abnormal   Collection Time: 09/06/16  1:34 PM  Result Value Ref Range   WBC 8.0 4.0 - 10.5 K/uL   RBC 3.84 (L) 3.87 - 5.11 MIL/uL   Hemoglobin 11.3 (L) 12.0 - 15.0 g/dL   HCT 35.0 (L) 36.0 - 46.0 %   MCV 91.1 78.0 - 100.0 fL   MCH 29.4 26.0 - 34.0 pg   MCHC 32.3 30.0 - 36.0 g/dL   RDW 13.6 11.5 - 15.5 %   Platelets 264 150 - 400 K/uL  Urinalysis, Routine w reflex microscopic     Status: Abnormal   Collection Time: 09/06/16  2:01 PM  Result Value Ref Range   Color, Urine YELLOW YELLOW   APPearance CLEAR CLEAR   Specific Gravity, Urine 1.011 1.005 - 1.030   pH 8.5 (H) 5.0 - 8.0   Glucose, UA NEGATIVE NEGATIVE mg/dL   Hgb urine dipstick NEGATIVE NEGATIVE   Bilirubin Urine NEGATIVE NEGATIVE   Ketones, ur NEGATIVE NEGATIVE mg/dL   Protein, ur NEGATIVE NEGATIVE mg/dL   Nitrite NEGATIVE NEGATIVE   Leukocytes, UA TRACE (A) NEGATIVE  Urine microscopic-add on     Status: Abnormal   Collection Time: 09/06/16  2:01 PM  Result Value Ref Range   Squamous Epithelial / LPF 6-30 (A) NONE SEEN   WBC, UA 0-5 0 - 5 WBC/hpf   RBC / HPF NONE SEEN 0 - 5 RBC/hpf   Bacteria, UA NONE SEEN NONE SEEN  Protime-INR     Status: Abnormal   Collection Time: 09/06/16  2:22 PM  Result Value Ref Range   Prothrombin Time 25.8 (H) 11.4 - 15.2 seconds   INR 2.31   Troponin I     Status: None   Collection Time: 09/06/16  2:22 PM  Result Value Ref Range   Troponin I <0.03 <0.03 ng/mL  POCT INR     Status: None   Collection Time: 09/22/16 11:30 AM  Result Value Ref Range   INR 3.0     Physical Exam: Consitutional ;  BP (!) 90/47 (BP Location: Left Wrist, Patient Position: Sitting, Cuff Size: Normal) Comment: States she is currently in A-fib  Pulse  (!) 108   Ht '5\' 3"'  (1.6 m)   Wt 200 lb 9.6 oz (91 kg)   BMI 35.53 kg/m   Musculoskeletal: Strength & Muscle Tone: decreased Gait & Station: normal Patient leans: N/A   Psychiatric Specialty Exam: General Appearance: Casual and Fairly Groomed  Engineer, water::  Fair  Speech:  Normal Rate  Volume:  Normal  Mood:  Anxious and Dysphoric  Affect:  Appropriate and Congruent  Thought Process:  Coherent and Goal Directed  Orientation:  Full (Time, Place, and Person)  Thought Content:  Rumination  Suicidal Thoughts:  No  Homicidal Thoughts:  No  Memory:  Immediate;   Fair Recent;   Fair Remote;   Fair  Judgement:  Intact  Insight:  Fair  Psychomotor Activity:  Normal  Concentration:  Fair  Recall:  [employer] of Knowledge:  Fair  Language:  Good  Akathisia:  No  Handed:  Right  AIMS (if indicated):     Assets:  Communication Skills Desire for Improvement Financial Resources/Insurance Housing Resilience  ADL's:  Intact  Cognition:  Impaired,  Mild  Sleep:        Established Problem, Stable/Improving (1), Review of Psycho-Social Stressors (1), Review or order clinical lab tests (1), Review and  summation of old records (2), Review of Last Therapy Session (1) and Review of Medication Regimen & Side Effects (2)  Assessment: Axis I; major depressive disorder, recurrent.  Posttraumatic stress disorder.  ,  Anxiety disorder NOS.  Cognitive disorder NOS  Axis III:  Past Medical History:  Diagnosis Date  . Complication of anesthesia    various issues with oxygen  saturations post op  . COPD (chronic obstructive pulmonary disease) ([place])   . Depression   . Diverticulitis   . DVT (deep venous thrombosis) ([place])   . Fibromyalgia   . GERD (gastroesophageal reflux disease)   . Hiatal hernia   . History of deviated nasal septum    left- side  . HTN (hypertension)   . Hyperlipidemia   . Obesity   . On supplemental oxygen therapy    concentrator at night @ 1.5 l/m or when  sleeps. O2 Sat niormally 87.  . Osteoporosis   . PAT (paroxysmal atrial tachycardia) ([place])   . PFO (patent foramen ovale)   . 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 Stable and 463min Oct 2012. No further fu  . [place] middle lobe [place]   First noted at admit 07/10/11. Persists on cxr 07/22/11. Cleared on CT 08/24/11. No further followup  . Stroke ([place]  . TOBACCO ABUSE 06/04/2009    Plan:  I reviewed records , lab work and notes from other provider.  She is recovering from her prolonged illness.  At this time he does not want to change her psychiatric medication.  In the past we had tried Effexor and Cymbalta but limited response.  I also offered counseling but patient declined.  She is getting narcotic pain medication hydrocodone and we discuss interaction of his psychiatric medication.  She is getting Neurontin , hydrocodone and muscle relaxants from Dr. BoFlorene Glen I will continue Pristiq 50 mg daily, trazodone 1 mg at bedtime.  She still have 2 mg Valium remaining which she takes only for severe anxiety and panic attacks.  Follow-up in 3 months.Discussed medication side effects and benefits.  Recommended to call [facility] if there is any question, concern or worsening of the symptoms.  Discuss safety plan that anytime having active suicidal thoughts or homicidal thoughts and she need to call 911 or go to the local emergency room.  Namira Rosekrans T., MD 10/01/2016

## 2016-10-15 NOTE — Progress Notes (Signed)
[name] Date of Birth: 1946/01/29 Medical Record [id number]  History of Present Illness: Mrs. Spagnoli is seen for follow up atrial fibrillation. She has a history of  abnormal ECG, severe COPD on oxygen, stroke after knee surgery in 2006. Evaluation at that time revealed a PFO by TEE. She was previously maintained on aspirin therapy. In December 2015 she was noted to have more paroxysmal Afib. She was anticoagulated and Toprol dose was increased to 25 mg. She wore an event monitor that showed infrequent runs of Afib.  In December she developed more facial droop and was concerned that she had another stroke. She was seen by Dr. Leonie Man and MRI done showing old CVA but no acute findings. He felt her symptoms were related to old CVA and some recent dental procedures. Carotid dopplers showed 50-69% stenosis bilaterally. She was admitted in October with epistaxis. Hgb down to 10. INR 4.8. Coumadin held and nose packed. Coumadin later resumed. Seen in ED 10/31 with diffuse body aches and fever. Evaluation unremarkable. She thinks this was related to the flu shot.   On follow up today she reports some abdominal pain. gets relief with Zofran. She has sinus drainage that is clear. She notes that her Afib is lasting longer now up to 2-3 hours. Really not very aware of it but notes it when she uses her BP monitor. HR in Afib 95-110. BP has been well controlled. No further bleeding.  Current Outpatient Prescriptions  Medication Sig Dispense Refill  . desvenlafaxine (PRISTIQ) 50 MG 24 hr tablet Take 1 tablet (50 mg total) by mouth daily. 30 tablet 2  . diazepam (VALIUM) 2 MG tablet Take 2 mg by mouth every 6 (six) hours as needed for anxiety.    . gabapentin (NEURONTIN) 100 MG capsule Take 100 mg by mouth 2 (two) times daily.     Marland Kitchen HYDROcodone-acetaminophen (NORCO/VICODIN) 5-325 MG tablet Take 1 tablet by mouth 2 (two) times daily.     Marland Kitchen ipratropium (ATROVENT) 0.02 % nebulizer solution Inhale 1 mL into  the lungs every 4 (four) hours.    . metoprolol succinate (TOPROL-XL) 25 MG 24 hr tablet Take 1/2 tablet by mouth daily    . montelukast (SINGULAIR) 10 MG tablet Take 10 mg by mouth at bedtime.    . ondansetron (ZOFRAN ODT) 8 MG disintegrating tablet Take 1 tablet (8 mg total) by mouth every 8 (eight) hours as needed for nausea or vomiting. 20 tablet 0  . oxybutynin (DITROPAN-XL) 10 MG 24 hr tablet Take 10 mg by mouth at bedtime.     . OXYGEN Inhale 1.5 L into the lungs. For naps and sleep.    . pantoprazole (PROTONIX) 40 MG tablet Take 1 tablet by mouth every morning.     . polyethylene glycol (MIRALAX / GLYCOLAX) packet Take 17 g by mouth daily as needed for mild constipation.     . rosuvastatin (CRESTOR) 5 MG tablet Take 5 mg by mouth once a week. Sunday  3  . tiZANidine (ZANAFLEX) 2 MG tablet Take by mouth every 6 (six) hours as needed for muscle spasms.    . traZODone (DESYREL) 100 MG tablet Take 1 tablet (100 mg total) by mouth at bedtime. 30 tablet 2  . warfarin (COUMADIN) 5 MG tablet Take 1/2 to 1 tablet by mouth daily or as directed by coumadin clinic (Patient taking differently: Take 2.5-5 mg by mouth daily. Take 2.5 on Tues, Wed, Thur, Sat, Sun and 5mg  on Mon and Fri) 30  tablet 1   No current facility-administered medications for this visit.     Allergies  Allergen Reactions  . Alprazolam     REACTION: stops breathing  . Pseudoephedrine Hcl Er Shortness Of Breath  . Budesonide-Formoterol Fumarate     bliusters  . Aciphex [Rabeprazole Sodium]     rash  . Avelox [Moxifloxacin Hcl In Nacl]     Stomach cramps.   . Esomeprazole Magnesium     REACTION: "bouncing off walls"  . Flonase [Fluticasone Propionate]   . Iodine     REACTION: swelling in throat  . Loratadine     claritin D causes shaking  . Lotrimin [Clotrimazole] Other (See Comments)    blisters  . Lunesta [Eszopiclone]     REACTION: "slept for a week"  . Other     Glue from ekg/heart monitor leads --rash +  Any  MYCINS  . Oxcarbazepine Other (See Comments)    Causes deep sleep and dizziness  . Statins Other (See Comments)  . Zolpidem Tartrate     REACTION: "slept for a week"  . Bextra [Valdecoxib] Rash  . Ceclor [Cefaclor] Rash  . Covera-Hs [Verapamil Hcl] Palpitations  . Dicyclomine Hcl Rash  . Tessalon Perles Rash    Past Medical History:  Diagnosis Date  . Complication of anesthesia    various issues with oxygen  saturations post op  . COPD (chronic obstructive pulmonary disease) ([place])   . Depression   . Diverticulitis   . DVT (deep venous thrombosis) ([place])   . Fibromyalgia   . GERD (gastroesophageal reflux disease)   . Hiatal hernia   . History of deviated nasal septum    left- side  . HTN (hypertension)   . Hyperlipidemia   . Obesity   . On supplemental oxygen therapy    concentrator at night @ 1.5 l/m or when sleeps. O2 Sat niormally 87.  . Osteoporosis   . PAT (paroxysmal atrial tachycardia) ([place])   . PFO (patent foramen ovale)   . PULMONARY NODULE, LEFT LOWER LOBE 10/14/2009   19mm LLL nodule dec 2010. Stable and 66mm in Oct 2012. No further fu  . Right middle lobe pneumonia ([place]) 07/24/2011   First noted at admit 07/10/11. Persists on cxr 07/22/11. Cleared on CT 08/24/11. No further followup  . Stroke ([place])   . TOBACCO ABUSE 06/04/2009    Past Surgical History:  Procedure Laterality Date  . CARDIOVASCULAR STRESS TEST  12/26/2004   EF 74%. NO EVIDENCE OF ISCHEMIA  . ESOPHAGOGASTRODUODENOSCOPY (EGD) WITH PROPOFOL N/A 04/15/2015   Procedure: ESOPHAGOGASTRODUODENOSCOPY (EGD) WITH PROPOFOL;  Surgeon: Laurence Spates, MD;  Location: [facility];  Service: Endoscopy;  Laterality: N/A;  . KNEE ARTHROSCOPY  2000   left  . LAPAROSCOPIC CHOLECYSTECTOMY  04-16-2010   [facility]  . MOUTH SURGERY     03-26-15 multiple extractions stitches remains  . TOTAL ABDOMINAL HYSTERECTOMY     post op needed oxygen was told "she gave them a scare"  . TUBAL LIGATION    . US ECHOCARDIOGRAPHY  11/20/2009    EF 55-60%    History  Smoking Status  . Former Smoker  . Quit date: 06/02/2010  Smokeless Tobacco  . Never Used    History  Alcohol Use No    Family History  Problem Relation Age of Onset  . Dementia Mother   . Diabetes Mother   . Alzheimer's disease Mother   . Heart attack Brother 24  . Heart attack Father   . Schizophrenia Sister   .  Diabetes Sister   . Tremor Sister     Review of Systems: The review of systems is per the HPI.  All other systems were reviewed and are negative.  Physical Exam: BP 106/80   Pulse 65   Ht 5\' 3"  (1.6 m)   Wt 197 lb 12.8 oz (89.7 kg)   BMI 35.04 kg/m  Patient is obese, alert and in NAD.  Skin is warm and dry. Color is normal.  HEENT is unremarkable. Normocephalic/atraumatic. PERRL. Sclera are nonicteric. Neck is supple. No masses. No JVD. Lungs are clear. CV RRR, No gallop or murmur.   Abdomen is soft. Extremities are without edema.   Wt Readings from Last 3 Encounters:  10/16/16 197 lb 12.8 oz (89.7 kg)  09/12/16 199 lb (90.3 kg)  09/01/16 198 lb (89.8 kg)    LABORATORY DATA/PROCEDURES:    Lab Results  Component Value Date   WBC 8.0 09/06/2016   HGB 11.3 (L) 09/06/2016   HCT 35.0 (L) 09/06/2016   PLT 264 09/06/2016   GLUCOSE 124 (H) 09/06/2016   CHOL 230 (H) 07/02/2016   TRIG 127 07/02/2016   HDL 50 07/02/2016   LDLCALC 155 (H) 07/02/2016   ALT 18 09/06/2016   AST 22 09/06/2016   NA 135 09/06/2016   K 3.9 09/06/2016   CL 100 (L) 09/06/2016   CREATININE 0.92 09/06/2016   BUN 10 09/06/2016   CO2 26 09/06/2016   TSH 1.960 08/16/2015   INR 3.0 09/22/2016   HGBA1C 6.0 02/06/2016    BNP (last 3 results) No results for input(s): PROBNP in the last 8760 hours.   Carotid dopplers in August 2017 with stable disease.   Assessment / Plan: 1. PAF -Continue toprol for rate control. INR will be checked today. Continue Coumadin.  Recommend continuing current therapy.   2. PFO - on anticoagulation.   3. COPD   4.  Hypertension -  Well controlled today and per her BP monitor.  5. Aortic root enlargement. 44 mm by Echo.   6. Abnormal Ecg with chronic T wave changes.

## 2016-10-16 ENCOUNTER — Ambulatory Visit (INDEPENDENT_AMBULATORY_CARE_PROVIDER_SITE_OTHER): Payer: Medicare Other | Admitting: Pharmacist

## 2016-10-16 ENCOUNTER — Ambulatory Visit (INDEPENDENT_AMBULATORY_CARE_PROVIDER_SITE_OTHER): Payer: Medicare Other | Admitting: Cardiology

## 2016-10-16 ENCOUNTER — Encounter: Payer: Self-pay | Admitting: Cardiology

## 2016-10-16 VITALS — BP 106/80 | HR 65 | Ht 63.0 in | Wt 197.8 lb

## 2016-10-16 DIAGNOSIS — I10 Essential (primary) hypertension: Secondary | ICD-10-CM | POA: Diagnosis not present

## 2016-10-16 DIAGNOSIS — Q2111 Secundum atrial septal defect: Secondary | ICD-10-CM

## 2016-10-16 DIAGNOSIS — Z7901 Long term (current) use of anticoagulants: Secondary | ICD-10-CM | POA: Diagnosis not present

## 2016-10-16 DIAGNOSIS — I480 Paroxysmal atrial fibrillation: Secondary | ICD-10-CM

## 2016-10-16 DIAGNOSIS — Q211 Atrial septal defect: Secondary | ICD-10-CM | POA: Diagnosis not present

## 2016-10-16 LAB — POCT INR: INR: 3.2

## 2016-10-16 NOTE — Patient Instructions (Signed)
Continue your current therapy  I will see you in 6 months.   

## 2016-10-18 ENCOUNTER — Other Ambulatory Visit: Payer: Self-pay | Admitting: Emergency Medicine

## 2016-11-06 ENCOUNTER — Ambulatory Visit (INDEPENDENT_AMBULATORY_CARE_PROVIDER_SITE_OTHER): Payer: Medicare Other | Admitting: Pharmacist

## 2016-11-06 DIAGNOSIS — Z7901 Long term (current) use of anticoagulants: Secondary | ICD-10-CM | POA: Diagnosis not present

## 2016-11-06 DIAGNOSIS — I480 Paroxysmal atrial fibrillation: Secondary | ICD-10-CM | POA: Diagnosis not present

## 2016-11-06 LAB — POCT INR: INR: 2.3

## 2016-11-17 ENCOUNTER — Ambulatory Visit (INDEPENDENT_AMBULATORY_CARE_PROVIDER_SITE_OTHER): Payer: Medicare Other | Admitting: Internal Medicine

## 2016-11-17 ENCOUNTER — Encounter: Payer: Self-pay | Admitting: Internal Medicine

## 2016-11-17 VITALS — BP 112/70 | HR 107 | Ht 63.0 in | Wt 195.6 lb

## 2016-11-17 DIAGNOSIS — J9611 Chronic respiratory failure with hypoxia: Secondary | ICD-10-CM | POA: Diagnosis not present

## 2016-11-17 DIAGNOSIS — R0789 Other chest pain: Secondary | ICD-10-CM | POA: Diagnosis not present

## 2016-11-17 DIAGNOSIS — J438 Other emphysema: Secondary | ICD-10-CM

## 2016-11-17 NOTE — Patient Instructions (Addendum)
ICD-9-CM ICD-10-CM   1. Chest pain, atypical 786.59 R07.89   2. Chronic respiratory failure with hypoxia (HCC) 518.83 J96.11    799.02    3. Other emphysema (HCC) 492.8 J43.8    Do not think chest pain is lung related - either heart or acid reflux or fibromyalgia related Copd is stable  Plan  - continue oxygen with exertion, atrovent as before  - understand and respect that these options have limitations but respect fact you do not want to oral inhalers or other nebulizers - any flare up or issue calls use  - ensure proper pain and fibromylagia mgmt - for chest pain - please talk top PCP SMITH,KRISTI, MD or revisit Dr [place] cardiology  followup  6-9 months or sooner if needed

## 2016-11-17 NOTE — Progress Notes (Signed)
Subjective:     Patient ID: Laurie Horton, female   DOB: [birth year], 71 y.o.   MRN: [id number] [place], [place]  HPI  OV 05/19/2016  Chief Complaint  Patient presents with  . Follow-up    Pt c/o worsening SOB, prod cough with thick white mucus.      This is a routine follow-up. Last visit was in April 2017 with nurse practitioner. At that time treated for COPD exacerbation according to her history but review of the chart does not show that to be true. At this point in time she says COPD stable although she says she might be in flare up on account of her fibromyalgia. Starting her symptoms out it appears that it is generally stable with dyspnea at baseline and cough with mild sputum at baseline. She is more hobbled by her chronic pain and fibromyalgia. Couple months ago apparently a hydrocodone was discontinued by rheumatology and therefore she is having "withdrawal". She does have a new pain medication physician. She is on gabapentin for fibromyalgia. There are no other new issues. She does not want antibiotics or prednisone for her perceived exacerbation  OV 11/17/2016  Chief Complaint  Patient presents with  . Follow-up    Pt states her SOB has worsened since last OV. Pt states she has a burning in her chest when she becomes SOB. Pt c/o prod cough with white mucus in morning, cough becomes nonprod throughout the day.     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 Last CT chest March 2017 without any mass and associated mild cor pulmonale   Six-month follow-up visit. She is completely overwhelmed by her fibromyalgia and depression. The fibromyalgia is worse. She says she is change doctors because of this. She's had a few admissions in the interim but none of them give a COPD exacerbation according to chart review. She suffered frustrated by her heels oxygen system even though it is light it is causing her pain.  She uses Atrovent nebulizer but wants change to something else but at the same time has rejected use of any other nebulizer or oral inhaler because of side effects. She is burning chest pain with inspiration and associated with her costochondral junction trigger points. 2014 review of the chart shows normal cardiac stress test. November 2017 chest x-ray is clear. She does not want any further imaging. Chest pain is mild to severe and variable. Worsened with inspiration. No radiation associated wheezing. No sputum production   has a past medical history of Complication of anesthesia; COPD (chronic obstructive pulmonary disease) ([place]); Depression; Diverticulitis; DVT (deep venous thrombosis) ([place]); Fibromyalgia; GERD (gastroesophageal reflux disease); Hiatal hernia; History of deviated nasal septum; HTN (hypertension); Hyperlipidemia; Obesity; On supplemental oxygen therapy; Osteoporosis; PAT (paroxysmal atrial tachycardia) (HCC); PFO (patent foramen ovale); PULMONARY NODULE, LEFT LOWER LOBE (10/14/2009); Right middle lobe pneumonia ([place]) (07/24/2011); Stroke [facility]); and TOBACCO ABUSE (06/04/2009).   reports that she quit smoking about 6 years ago. She has never used smokeless tobacco.  Past Surgical History:  Procedure Laterality Date  . CARDIOVASCULAR STRESS TEST  12/26/2004   EF 74%. NO EVIDENCE OF ISCHEMIA  . ESOPHAGOGASTRODUODENOSCOPY (EGD) WITH PROPOFOL N/A 04/15/2015   Procedure: ESOPHAGOGASTRODUODENOSCOPY (EGD) WITH PROPOFOL;  Surgeon: Laurence Spates, MD;  Location: [facility];  Service: Endoscopy;  Laterality: N/A;  . KNEE ARTHROSCOPY  2000   left  . LAPAROSCOPIC CHOLECYSTECTOMY  04-16-2010   [facility]  . MOUTH SURGERY     03-26-15  multiple extractions stitches remains  . TOTAL ABDOMINAL HYSTERECTOMY     post op needed oxygen was told "she gave them a scare"  . TUBAL LIGATION    . US ECHOCARDIOGRAPHY  11/20/2009   EF 55-60%    Allergies  Allergen Reactions  . Alprazolam     REACTION: stops  breathing  . Pseudoephedrine Hcl Er Shortness Of Breath  . Budesonide-Formoterol Fumarate     bliusters  . Aciphex [Rabeprazole Sodium]     rash  . Avelox [Moxifloxacin Hcl In Nacl]     Stomach cramps.   . Esomeprazole Magnesium     REACTION: "bouncing off walls"  . Flonase [Fluticasone Propionate]   . Iodine     REACTION: swelling in throat  . Loratadine     claritin D causes shaking  . Lotrimin [Clotrimazole] Other (See Comments)    blisters  . Lunesta [Eszopiclone]     REACTION: "slept for a week"  . Other     Glue from ekg/heart monitor leads --rash +  Any MYCINS  . Oxcarbazepine Other (See Comments)    Causes deep sleep and dizziness  . Statins Other (See Comments)  . Zolpidem Tartrate     REACTION: "slept for a week"  . Bextra [Valdecoxib] Rash  . Ceclor [Cefaclor] Rash  . Covera-Hs [Verapamil Hcl] Palpitations  . Dicyclomine Hcl Rash  . Tessalon Perles Rash    Immunization History  Administered Date(s) Administered  . Influenza Split 08/03/2011, 08/01/2012  . Influenza,inj,Quad PF,36+ Mos 08/14/2013, 09/03/2014, 09/04/2015, 08/18/2016  . Pneumococcal Conjugate-13 10/01/2014  . Pneumococcal Polysaccharide-23 09/03/1999, 06/02/2006, 08/14/2013  . Td 11/02/1994  . Tdap 08/18/2016    Family History  Problem Relation Age of Onset  . Dementia Mother   . Diabetes Mother   . Alzheimer's disease Mother   . Heart attack Brother 24  . Heart attack Father   . Schizophrenia Sister   . Diabetes Sister   . Tremor Sister      Current Outpatient Prescriptions:  .  desvenlafaxine (PRISTIQ) 50 MG 24 hr tablet, Take 1 tablet (50 mg total) by mouth daily., Disp: 30 tablet, Rfl: 2 .  diazepam (VALIUM) 2 MG tablet, Take 2 mg by mouth every 6 (six) hours as needed for anxiety., Disp: , Rfl:  .  gabapentin (NEURONTIN) 100 MG capsule, Take 100 mg by mouth 2 (two) times daily. , Disp: , Rfl:  .  HYDROcodone-acetaminophen (NORCO/VICODIN) 5-325 MG tablet, Take 1 tablet by  mouth 2 (two) times daily. , Disp: , Rfl:  .  ipratropium (ATROVENT) 0.02 % nebulizer solution, Inhale 1 mL into the lungs every 4 (four) hours as needed. , Disp: , Rfl:  .  metoprolol succinate (TOPROL-XL) 25 MG 24 hr tablet, Take 1/2 tablet by mouth daily, Disp: , Rfl:  .  montelukast (SINGULAIR) 10 MG tablet, TAKE 1 TABLET BY MOUTH AT BEDTIME, Disp: 90 tablet, Rfl: 0 .  ondansetron (ZOFRAN ODT) 8 MG disintegrating tablet, Take 1 tablet (8 mg total) by mouth every 8 (eight) hours as needed for nausea or vomiting., Disp: 20 tablet, Rfl: 0 .  oxybutynin (DITROPAN-XL) 10 MG 24 hr tablet, Take 10 mg by mouth at bedtime. , Disp: , Rfl:  .  OXYGEN, Inhale 1.5 L into the lungs. For naps and sleep., Disp: , Rfl:  .  pantoprazole (PROTONIX) 40 MG tablet, Take 1 tablet by mouth every morning. , Disp: , Rfl:  .  polyethylene glycol (MIRALAX / GLYCOLAX) packet, Take 17 g by  mouth daily as needed for mild constipation. , Disp: , Rfl:  .  rosuvastatin (CRESTOR) 5 MG tablet, Take 5 mg by mouth once a week. Sunday, Disp: , Rfl: 3 .  tiZANidine (ZANAFLEX) 2 MG tablet, Take by mouth every 6 (six) hours as needed for muscle spasms., Disp: , Rfl:  .  traZODone (DESYREL) 100 MG tablet, Take 1 tablet (100 mg total) by mouth at bedtime., Disp: 30 tablet, Rfl: 2 .  warfarin (COUMADIN) 5 MG tablet, Take 1/2 to 1 tablet by mouth daily or as directed by coumadin clinic (Patient taking differently: Take 2.5-5 mg by mouth daily. Take 2.5 on Tues, Wed, Thur, Sat, Sun and 5mg  on Mon and Fri), Disp: 30 tablet, Rfl: 1   Review of Systems     Objective:   Physical Exam  Constitutional: She is oriented to person, place, and time. She appears well-developed and well-nourished. No distress.  Obese and flat affect  HENT:  Head: Normocephalic and atraumatic.  Right Ear: External ear normal.  Left Ear: External ear normal.  Mouth/Throat: Oropharynx is clear and moist. No oropharyngeal exudate.  Eyes: Conjunctivae and EOM are  normal. Pupils are equal, round, and reactive to light. Right eye exhibits no discharge. Left eye exhibits no discharge. No scleral icterus.  Neck: Normal range of motion. Neck supple. No JVD present. No tracheal deviation present. No thyromegaly present.  Cardiovascular: Normal rate, regular rhythm, normal heart sounds and intact distal pulses.  Exam reveals no gallop and no friction rub.   No murmur heard. Pulmonary/Chest: Effort normal and breath sounds normal. No respiratory distress. She has no wheezes. She has no rales. She exhibits no tenderness.  Multiple reproducible trigger points in the chest and the arms and the abdomen  Abdominal: Soft. Bowel sounds are normal. She exhibits no distension and no mass. There is no tenderness. There is no rebound and no guarding.  Musculoskeletal: Normal range of motion. She exhibits no edema or tenderness.  Lymphadenopathy:    She has no cervical adenopathy.  Neurological: She is alert and oriented to person, place, and time. She has normal reflexes. No cranial nerve deficit. She exhibits normal muscle tone. Coordination normal.  Skin: Skin is warm and dry. No rash noted. She is not diaphoretic. No erythema. No pallor.  Psychiatric:  Flat affect and in 2 years because of pain blood pressure being taken in the left arm because of fibromyalgia  Vitals reviewed.   Vitals:   11/17/16 0918  BP: 112/70  Pulse: (!) 107  SpO2: 93%  Weight: 195 lb 9.6 oz (88.7 kg)  Height: 5\' 3"  (1.6 m)   Estimated body mass index is 34.65 kg/m as calculated from the following:   Height as of this encounter: 5\' 3"  (1.6 m).   Weight as of this encounter: 195 lb 9.6 oz (88.7 kg).       Assessment:          ICD-9-CM ICD-10-CM   1. Chest pain, atypical 786.59 R07.89   2. Chronic respiratory failure with hypoxia (HCC) 518.83 J96.11    799.02    3. Other emphysema ([place]) 492.8 J43.8        Plan:      Do not think chest pain is lung related - either heart or  acid reflux or fibromyalgia related Copd is stable  Plan  - continue oxygen with exertion, atrovent as before  - understand and respect that these options have limitations but respect fact you do not want  to oral inhalers or other nebulizers - any flare up or issue calls use  - ensure proper pain and fibromylagia mgmt - for chest pain - please talk top PCP SMITH,KRISTI, MD or revisit Dr [place] cardiology  followup  6-9 months or sooner if needed

## 2016-11-20 ENCOUNTER — Encounter: Payer: Self-pay | Admitting: Family Medicine

## 2016-11-24 ENCOUNTER — Ambulatory Visit: Payer: Medicare Other | Admitting: Family Medicine

## 2016-12-01 ENCOUNTER — Encounter: Payer: Self-pay | Admitting: Family Medicine

## 2016-12-01 ENCOUNTER — Encounter: Payer: Medicare Other | Admitting: Family Medicine

## 2016-12-01 NOTE — Patient Instructions (Signed)
     IF you received an x-ray today, you will receive an invoice from [facility]. Please contact [facility] at [phone] with questions or concerns regarding your invoice.   IF you received labwork today, you will receive an invoice from [facility]. Please contact [facility] at [phone] with questions or concerns regarding your invoice.   Our billing staff will not be able to assist you with questions regarding bills from these companies.  You will be contacted with the lab results as soon as they are available. The fastest way to get your results is to activate your My Chart account. Instructions are located on the last page of this paperwork. If you have not heard from us regarding the results in 2 weeks, please contact this office.     

## 2016-12-04 ENCOUNTER — Ambulatory Visit (INDEPENDENT_AMBULATORY_CARE_PROVIDER_SITE_OTHER): Payer: Medicare Other | Admitting: Pharmacist

## 2016-12-04 DIAGNOSIS — I480 Paroxysmal atrial fibrillation: Secondary | ICD-10-CM

## 2016-12-04 DIAGNOSIS — Z7901 Long term (current) use of anticoagulants: Secondary | ICD-10-CM | POA: Diagnosis not present

## 2016-12-04 LAB — POCT INR: INR: 2.2

## 2016-12-09 ENCOUNTER — Ambulatory Visit (INDEPENDENT_AMBULATORY_CARE_PROVIDER_SITE_OTHER): Payer: Medicare Other | Admitting: Family Medicine

## 2016-12-09 ENCOUNTER — Telehealth: Payer: Self-pay | Admitting: Cardiology

## 2016-12-09 ENCOUNTER — Encounter: Payer: Self-pay | Admitting: Family Medicine

## 2016-12-09 VITALS — BP 100/61 | HR 92 | Temp 98.6°F | Resp 18 | Ht 63.0 in | Wt 196.6 lb

## 2016-12-09 DIAGNOSIS — M797 Fibromyalgia: Secondary | ICD-10-CM | POA: Diagnosis not present

## 2016-12-09 DIAGNOSIS — F411 Generalized anxiety disorder: Secondary | ICD-10-CM

## 2016-12-09 DIAGNOSIS — I480 Paroxysmal atrial fibrillation: Secondary | ICD-10-CM | POA: Diagnosis not present

## 2016-12-09 DIAGNOSIS — J438 Other emphysema: Secondary | ICD-10-CM

## 2016-12-09 DIAGNOSIS — Z7901 Long term (current) use of anticoagulants: Secondary | ICD-10-CM | POA: Diagnosis not present

## 2016-12-09 DIAGNOSIS — F331 Major depressive disorder, recurrent, moderate: Secondary | ICD-10-CM

## 2016-12-09 DIAGNOSIS — I6523 Occlusion and stenosis of bilateral carotid arteries: Secondary | ICD-10-CM | POA: Diagnosis not present

## 2016-12-09 DIAGNOSIS — E7800 Pure hypercholesterolemia, unspecified: Secondary | ICD-10-CM

## 2016-12-09 DIAGNOSIS — I10 Essential (primary) hypertension: Secondary | ICD-10-CM | POA: Diagnosis not present

## 2016-12-09 DIAGNOSIS — M47816 Spondylosis without myelopathy or radiculopathy, lumbar region: Secondary | ICD-10-CM

## 2016-12-09 NOTE — Progress Notes (Signed)
Subjective:    Patient ID: Laurie Horton, female    DOB: 1946-07-22, 71 y.o.   MRN: [id number]  12/09/2016  Follow-up (3 month follow up); blood work (needs to have a liver panel done); and other (patient states she is suppose to have shots in her belly; she wants to discuss this )   HPI This 71 y.o. female presents for follow-up of chronic medical conditions.   Had to stop Crestor; had been taking for one year; unable to walk so stopped Crestor in October. Dr. [place] decreased Crestor to once weekly; then increased to twice weekly on Sunday and Thursday yet "I couldn't walk".  Cardiology agreed to stopping Crestor but then recommended injections in belly.  Remember previous shots in belly; injection would be administer at cardiology office. Remembers Lovenox injections; very painful.  Very hesitant to receive any injections.  Did not tolerate injections in the past.  Less pain with Lovenox.  Recommended starting Fish Oil per pain management.  Stopped Crestor daily in October; stopped Crestor completely one week ago.  Per records, Crestor started 5mg  daily by Nada Boozer, NP.  Ate a Boost for breakfast.  Black coffee this morning.   Has known aortic atherosclerosis, CAD, and carotid stenosis present. S/p follow-up with pulmonology for chest pain; Dr. Marchelle Gearing felt that chest pain not due to pulmonology issues; felt due to cardiac or GI issues or fibromyalgia.    Pain management needs LFTs due to hydrocodone.    OAB: sees [place] next month; oxybutynin.   No further epistaxis.    Immunization History  Administered Date(s) Administered  . Influenza Split 08/03/2011, 08/01/2012  . Influenza,inj,Quad PF,36+ Mos 08/14/2013, 09/03/2014, 09/04/2015, 08/18/2016  . Pneumococcal Conjugate-13 10/01/2014  . Pneumococcal Polysaccharide-23 09/03/1999, 06/02/2006, 08/14/2013  . Td 11/02/1994  . Tdap 08/18/2016   BP Readings from Last 3 Encounters:  12/28/16 130/82  12/09/16 100/61    12/01/16 136/78   Wt Readings from Last 3 Encounters:  12/28/16 198 lb (89.8 kg)  12/09/16 196 lb 9.6 oz (89.2 kg)  12/01/16 198 lb (89.8 kg)    Review of Systems  Constitutional: Negative for chills, diaphoresis, fatigue and fever.  Eyes: Negative for visual disturbance.  Respiratory: Negative for cough and shortness of breath.   Cardiovascular: Negative for chest pain, palpitations and leg swelling.  Gastrointestinal: Negative for abdominal pain, constipation, diarrhea, nausea and vomiting.  Endocrine: Negative for cold intolerance, heat intolerance, polydipsia, polyphagia and polyuria.  Neurological: Negative for dizziness, tremors, seizures, syncope, facial asymmetry, speech difficulty, weakness, light-headedness, numbness and headaches.  Psychiatric/Behavioral: Positive for dysphoric mood. The patient is nervous/anxious.     Past Medical History:  Diagnosis Date  . Complication of anesthesia    various issues with oxygen  saturations post op  . COPD (chronic obstructive pulmonary disease) (HCC)   . Depression   . Diverticulitis   . DVT (deep venous thrombosis) (HCC)   . Fibromyalgia   . GERD (gastroesophageal reflux disease)   . Hiatal hernia   . History of deviated nasal septum    left- side  . HTN (hypertension)   . Hyperlipidemia   . Obesity   . On supplemental oxygen therapy    concentrator at night @ 1.5 l/m or when sleeps. O2 Sat niormally 87.  . Osteoporosis   . PAT (paroxysmal atrial tachycardia) (HCC)   . PFO (patent foramen ovale)   . PULMONARY NODULE, LEFT LOWER LOBE 10/14/2009   5mm LLL nodule dec 2010. Stable and 4mm in  Oct 2012. No further fu  . Right middle lobe pneumonia (HCC) 07/24/2011   First noted at admit 07/10/11. Persists on cxr 07/22/11. Cleared on CT 08/24/11. No further followup  . Stroke (HCC)   . TOBACCO ABUSE 06/04/2009   Past Surgical History:  Procedure Laterality Date  . CARDIOVASCULAR STRESS TEST  12/26/2004   EF 74%. NO EVIDENCE OF  ISCHEMIA  . ESOPHAGOGASTRODUODENOSCOPY (EGD) WITH PROPOFOL N/A 04/15/2015   Procedure: ESOPHAGOGASTRODUODENOSCOPY (EGD) WITH PROPOFOL;  Surgeon: Carman Ching, MD;  Location: [facility];  Service: Endoscopy;  Laterality: N/A;  . KNEE ARTHROSCOPY  2000   left  . LAPAROSCOPIC CHOLECYSTECTOMY  04-16-2010   [facility]  . MOUTH SURGERY     03-26-15 multiple extractions stitches remains  . TOTAL ABDOMINAL HYSTERECTOMY     post op needed oxygen was told "she gave them a scare"  . TUBAL LIGATION    . US ECHOCARDIOGRAPHY  11/20/2009   EF 55-60%   Allergies  Allergen Reactions  . Alprazolam     REACTION: stops breathing  . Pseudoephedrine Hcl Er Shortness Of Breath  . Budesonide-Formoterol Fumarate     bliusters  . Aciphex [Rabeprazole Sodium]     rash  . Avelox [Moxifloxacin Hcl In Nacl]     Stomach cramps.   . Crestor [Rosuvastatin Calcium] Other (See Comments)    Unable to walk  . Esomeprazole Magnesium     REACTION: "bouncing off walls"  . Flonase [Fluticasone Propionate]   . Iodine     REACTION: swelling in throat  . Loratadine     claritin D causes shaking  . Lotrimin [Clotrimazole] Other (See Comments)    blisters  . Lunesta [Eszopiclone]     REACTION: "slept for a week"  . Other     Glue from ekg/heart monitor leads --rash +  Any MYCINS  . Oxcarbazepine Other (See Comments)    Causes deep sleep and dizziness  . Statins Other (See Comments)  . Zolpidem Tartrate     REACTION: "slept for a week"  . Bextra [Valdecoxib] Rash  . Ceclor [Cefaclor] Rash  . Covera-Hs [Verapamil Hcl] Palpitations  . Dicyclomine Hcl Rash  . Tessalon Perles Rash    Social History   Social History  . Marital status: Divorced    Spouse name: N/A  . Number of children: 2  . Years of education: N/A   Occupational History  . disability    Social History Main Topics  . Smoking status: Former Smoker    Quit date: 06/02/2010  . Smokeless tobacco: Never Used  . Alcohol use No  . Drug use: No    . Sexual activity: Yes   Other Topics Concern  . Not on file   Social History Narrative  . No narrative on file   Family History  Problem Relation Age of Onset  . Dementia Mother   . Diabetes Mother   . Alzheimer's disease Mother   . Heart attack Brother 42  . Heart attack Father   . Schizophrenia Sister   . Diabetes Sister   . Tremor Sister        Objective:    BP 100/61 (BP Location: Left Arm, Patient Position: Sitting, Cuff Size: Normal) Comment: patient used her own device; refused to use office equipment Comment (Cuff Size): patient used her own equipment  Pulse 92   Temp 98.6 F (37 C) (Oral)   Resp 18   Ht 5\' 3"  (1.6 m)   Wt 196 lb 9.6 oz (89.2  kg)   SpO2 90%   BMI 34.83 kg/m  Physical Exam  Constitutional: She is oriented to person, place, and time. She appears well-developed and well-nourished. No distress.  HENT:  Head: Normocephalic and atraumatic.  Right Ear: External ear normal.  Left Ear: External ear normal.  Nose: Nose normal.  Mouth/Throat: Oropharynx is clear and moist.  Eyes: Conjunctivae and EOM are normal. Pupils are equal, round, and reactive to light.  Neck: Normal range of motion. Neck supple. Carotid bruit is not present. No thyromegaly present.  Cardiovascular: Normal rate, regular rhythm, normal heart sounds and intact distal pulses.  Exam reveals no gallop and no friction rub.   No murmur heard. Pulmonary/Chest: Effort normal and breath sounds normal. She has no wheezes. She has no rales.  Abdominal: Soft. Bowel sounds are normal. She exhibits no distension and no mass. There is no tenderness. There is no rebound and no guarding.  Lymphadenopathy:    She has no cervical adenopathy.  Neurological: She is alert and oriented to person, place, and time. No cranial nerve deficit.  Skin: Skin is warm and dry. No rash noted. She is not diaphoretic. No erythema. No pallor.  Psychiatric: She has a normal mood and affect. Her behavior is normal.     Depression screen [facility] 2/9 12/01/2016 09/12/2016 09/01/2016 08/18/2016 08/18/2016  Decreased Interest 0 0 0 0 0  Down, Depressed, Hopeless 0 0 0 0 0  PHQ - 2 Score 0 0 0 0 0  Altered sleeping - - - - -  Tired, decreased energy - - - - -  Change in appetite - - - - -  Feeling bad or failure about yourself  - - - - -  Trouble concentrating - - - - -  Moving slowly or fidgety/restless - - - - -  Suicidal thoughts - - - - -  PHQ-9 Score - - - - -  Difficult doing work/chores - - - - -  Some recent data might be hidden   Fall Risk  12/01/2016 09/12/2016 09/01/2016 08/18/2016 07/31/2016  Falls in the past year? No Yes No Yes Yes  Number falls in past yr: - 1 - - -  Injury with Fall? - No - - -  Follow up - - - - -       Assessment & Plan:   1. Pure hypercholesterolemia   2. PAF (paroxysmal atrial fibrillation) (HCC)   3. Essential hypertension   4. Bilateral carotid artery stenosis   5. Other emphysema (HCC)   6. Fibromyalgia   7. Moderate episode of recurrent major depressive disorder (HCC)   8. Generalized anxiety disorder   9. Spondylosis of lumbar region without myelopathy or radiculopathy   10. Long-term (current) use of anticoagulants    -hypercholesterolemia uncontrolled; intolerant to statin therapy; considering alternative therapies with cardiology. -s/p recent follow-up with pulmonology; COPD stable; continue oxygen nighttime per pulmonology. -no recurrent epistaxis that has been severe.  Supportive care with nasal saline qhs. -followed closely by psychiatry and pain management.   Orders Placed This Encounter  Procedures  . CBC with Differential/Platelet  . Comprehensive metabolic panel    Order Specific Question:   Has the patient fasted?    Answer:   Yes  . Lipid panel    Order Specific Question:   Has the patient fasted?    Answer:   Yes   No orders of the defined types were placed in this encounter.   Return in about 4 months (  around 04/08/2017) for  ,  complete physical examiniation.   Tuck Dulworth Paulita Fujita, M.D. Primary Care at [facility] previously [facility] [address] [place], [place]  [address] [phone] phone [phone] fax

## 2016-12-09 NOTE — Telephone Encounter (Signed)
We are not processing any Prior Authorization for any injectables at this time.    Talked to patient. She will wait for blood work results. Then will contact CV[facility] clinic to request input from cardiologist and CVRR pharmacist.

## 2016-12-09 NOTE — Patient Instructions (Signed)
     IF you received an x-ray today, you will receive an invoice from [facility]. Please contact [facility] at [phone] with questions or concerns regarding your invoice.   IF you received labwork today, you will receive an invoice from [facility]. Please contact [facility] at [phone] with questions or concerns regarding your invoice.   Our billing staff will not be able to assist you with questions regarding bills from these companies.  You will be contacted with the lab results as soon as they are available. The fastest way to get your results is to activate your My Chart account. Instructions are located on the last page of this paperwork. If you have not heard from us regarding the results in 2 weeks, please contact this office.     

## 2016-12-09 NOTE — Telephone Encounter (Signed)
Laurie Horton [facility]) is calling to get a Prior Auth on a statin drug ( not sure of the name ) its an injectable drug. Please call if you have any questions . Thanks

## 2016-12-10 LAB — COMPREHENSIVE METABOLIC PANEL
A/G RATIO: 1.8 (ref 1.2–2.2)
ALBUMIN: 4.2 g/dL (ref 3.5–4.8)
ALT: 17 IU/L (ref 0–32)
AST: 17 IU/L (ref 0–40)
Alkaline Phosphatase: 90 IU/L (ref 39–117)
BILIRUBIN TOTAL: 0.2 mg/dL (ref 0.0–1.2)
BUN / CREAT RATIO: 16 (ref 12–28)
BUN: 15 mg/dL (ref 8–27)
CHLORIDE: 97 mmol/L (ref 96–106)
CO2: 24 mmol/L (ref 18–29)
Calcium: 10 mg/dL (ref 8.7–10.3)
Creatinine, Ser: 0.95 mg/dL (ref 0.57–1.00)
GFR calc non Af Amer: 60 mL/min/{1.73_m2} (ref >59)
GFR, EST AFRICAN AMERICAN: 70 mL/min/{1.73_m2} (ref >59)
GLOBULIN, TOTAL: 2.3 g/dL (ref 1.5–4.5)
Glucose: 114 mg/dL — ABNORMAL HIGH (ref 65–99)
POTASSIUM: 4.6 mmol/L (ref 3.5–5.2)
Sodium: 139 mmol/L (ref 134–144)
TOTAL PROTEIN: 6.5 g/dL (ref 6.0–8.5)

## 2016-12-10 LAB — CBC WITH DIFFERENTIAL/PLATELET
Basophils Absolute: 0 10*3/uL (ref 0.0–0.2)
Basos: 0 %
EOS (ABSOLUTE): 0.1 10*3/uL (ref 0.0–0.4)
EOS: 2 %
HEMATOCRIT: 36.6 % (ref 34.0–46.6)
Hemoglobin: 11.8 g/dL (ref 11.1–15.9)
IMMATURE GRANS (ABS): 0 10*3/uL (ref 0.0–0.1)
IMMATURE GRANULOCYTES: 0 %
LYMPHS: 28 %
Lymphocytes Absolute: 1.6 10*3/uL (ref 0.7–3.1)
MCH: 27.3 pg (ref 26.6–33.0)
MCHC: 32.2 g/dL (ref 31.5–35.7)
MCV: 85 fL (ref 79–97)
MONOS ABS: 0.6 10*3/uL (ref 0.1–0.9)
Monocytes: 11 %
NEUTROS PCT: 59 %
Neutrophils Absolute: 3.4 10*3/uL (ref 1.4–7.0)
PLATELETS: 258 10*3/uL (ref 150–379)
RBC: 4.32 x10E6/uL (ref 3.77–5.28)
RDW: 17.1 % — AB (ref 12.3–15.4)
WBC: 5.7 10*3/uL (ref 3.4–10.8)

## 2016-12-10 LAB — LIPID PANEL
CHOL/HDL RATIO: 5.9 ratio — AB (ref 0.0–4.4)
Cholesterol, Total: 207 mg/dL — ABNORMAL HIGH (ref 100–199)
HDL: 35 mg/dL — ABNORMAL LOW (ref >39)
LDL Calculated: 128 mg/dL — ABNORMAL HIGH (ref 0–99)
Triglycerides: 221 mg/dL — ABNORMAL HIGH (ref 0–149)
VLDL Cholesterol Cal: 44 mg/dL — ABNORMAL HIGH (ref 5–40)

## 2016-12-14 ENCOUNTER — Telehealth: Payer: Self-pay | Admitting: Cardiology

## 2016-12-14 NOTE — Telephone Encounter (Signed)
Returned call to patient.She stated she saw Dr.Ramaswamy for sob, he told her sob is not from lungs and copd.Advised her to see Dr.Jordan.She refused to see a PA.Appointment scheduled with Dr.Jordan 12/28/16 at 2:30 pm.

## 2016-12-14 NOTE — Telephone Encounter (Signed)
New message      Talk to the nurse to update up on ov from lung doctor

## 2016-12-15 ENCOUNTER — Other Ambulatory Visit: Payer: Self-pay | Admitting: Cardiology

## 2016-12-18 ENCOUNTER — Encounter: Payer: Self-pay | Admitting: Cardiology

## 2016-12-22 ENCOUNTER — Telehealth: Payer: Self-pay

## 2016-12-22 NOTE — Telephone Encounter (Signed)
Pt wants her labs read to her and Dr Greta Doom was suppose to received a copy of her lab work and he hasnt received a copy per pt.  Please advise pt: [phone]

## 2016-12-22 NOTE — Telephone Encounter (Signed)
Faxing labs to dr. Greta Doom.  Patient given lab results.

## 2016-12-22 NOTE — Telephone Encounter (Signed)
1.  Sugar elevated at 114.  2.  Cholesterol elevated at 207 and triglycerides elevated at 221.  3. Liver and kidney functions are normal.  4. Please fax copy to Dr. Greta Doom.

## 2016-12-27 NOTE — Progress Notes (Signed)
Laurie Horton Date of Birth: 1946-09-25 Medical Record [id number]  History of Present Illness: Laurie Horton is seen for follow up atrial fibrillation. She has a history of  abnormal ECG, severe COPD on oxygen, stroke after knee surgery in 2006. Evaluation at that time revealed a PFO by TEE. She was previously maintained on aspirin therapy. In December 2015 she was noted to have more paroxysmal Afib. She was anticoagulated and Toprol dose was increased to 25 mg. She wore an event monitor that showed infrequent runs of Afib.  In October 2016 she developed more facial droop and was concerned that she had another stroke. She was seen by Dr. Leonie Man and MRI done showing old CVA but no acute findings. He felt her symptoms were related to old CVA and some recent dental procedures. Carotid dopplers showed 50-69% stenosis bilaterally. She was admitted in October 2017 with epistaxis. Hgb down to 10. INR 4.8. Coumadin held and nose packed. Coumadin later resumed.   On follow up today she reports a one month history of sternal pain. Pain is constant and worse with breathing. No radiation. No improvement with antacids or hydrocodone. She is followed in the pain clinic. She notes that her Afib is lasting  up to 2-3 hours. Really not very aware of it but notes it when she uses her BP monitor. HR in Afib 95-110. Toprol dose reduced due to low BP. BP has been well controlled. No further bleeding.  Current Outpatient Prescriptions  Medication Sig Dispense Refill  . desvenlafaxine (PRISTIQ) 50 MG 24 hr tablet Take 1 tablet (50 mg total) by mouth daily. 30 tablet 2  . diazepam (VALIUM) 2 MG tablet Take 2 mg by mouth every 6 (six) hours as needed for anxiety.    . gabapentin (NEURONTIN) 100 MG capsule Take 100 mg by mouth 4 (four) times daily.     Marland Kitchen HYDROcodone-acetaminophen (NORCO/VICODIN) 5-325 MG tablet Take 1 tablet by mouth 2 (two) times daily.     Marland Kitchen ipratropium (ATROVENT) 0.02 % nebulizer solution Inhale 1 mL  into the lungs every 4 (four) hours as needed.     . metoprolol succinate (TOPROL-XL) 25 MG 24 hr tablet Take 1/2 tablet by mouth daily    . montelukast (SINGULAIR) 10 MG tablet TAKE 1 TABLET BY MOUTH AT BEDTIME 90 tablet 0  . ondansetron (ZOFRAN ODT) 8 MG disintegrating tablet Take 1 tablet (8 mg total) by mouth every 8 (eight) hours as needed for nausea or vomiting. 20 tablet 0  . oxybutynin (DITROPAN-XL) 10 MG 24 hr tablet Take 10 mg by mouth at bedtime.     . OXYGEN Inhale 1.5 L into the lungs. For naps and sleep.    . pantoprazole (PROTONIX) 40 MG tablet Take 1 tablet by mouth every morning.     . polyethylene glycol (MIRALAX / GLYCOLAX) packet Take 17 g by mouth daily as needed for mild constipation.     . rosuvastatin (CRESTOR) 5 MG tablet Take 5 mg by mouth once a week. Sunday  3  . tiZANidine (ZANAFLEX) 2 MG tablet Take by mouth every 6 (six) hours as needed for muscle spasms.    . traZODone (DESYREL) 100 MG tablet Take 1 tablet (100 mg total) by mouth at bedtime. 30 tablet 2  . warfarin (COUMADIN) 5 MG tablet TAKE 1/2 TO 1 TABLET BY MOUTH EVERY DAY OR AS DIRECTED BY COUMADIN CLINIC (FILLABLE 09/02/16) 30 tablet 1   No current facility-administered medications for this visit.  Allergies  Allergen Reactions  . Alprazolam     REACTION: stops breathing  . Pseudoephedrine Hcl Er Shortness Of Breath  . Budesonide-Formoterol Fumarate     bliusters  . Aciphex [Rabeprazole Sodium]     rash  . Avelox [Moxifloxacin Hcl In Nacl]     Stomach cramps.   . Crestor [Rosuvastatin Calcium] Other (See Comments)    Unable to walk  . Esomeprazole Magnesium     REACTION: "bouncing off walls"  . Flonase [Fluticasone Propionate]   . Iodine     REACTION: swelling in throat  . Loratadine     claritin D causes shaking  . Lotrimin [Clotrimazole] Other (See Comments)    blisters  . Lunesta [Eszopiclone]     REACTION: "slept for a week"  . Other     Glue from ekg/heart monitor leads --rash +    Any MYCINS  . Oxcarbazepine Other (See Comments)    Causes deep sleep and dizziness  . Statins Other (See Comments)  . Zolpidem Tartrate     REACTION: "slept for a week"  . Bextra [Valdecoxib] Rash  . Ceclor [Cefaclor] Rash  . Covera-Hs [Verapamil Hcl] Palpitations  . Dicyclomine Hcl Rash  . Tessalon Perles Rash    Past Medical History:  Diagnosis Date  . Complication of anesthesia    various issues with oxygen  saturations post op  . COPD (chronic obstructive pulmonary disease) ([place])   . Depression   . Diverticulitis   . DVT (deep venous thrombosis) ([place])   . Fibromyalgia   . GERD (gastroesophageal reflux disease)   . Hiatal hernia   . History of deviated nasal septum    left- side  . HTN (hypertension)   . Hyperlipidemia   . Obesity   . On supplemental oxygen therapy    concentrator at night @ 1.5 l/m or when sleeps. O2 Sat niormally 87.  . Osteoporosis   . PAT (paroxysmal atrial tachycardia) ([place])   . PFO (patent foramen ovale)   . PULMONARY NODULE, LEFT LOWER LOBE 10/14/2009   67mm LLL nodule dec 2010. Stable and 33mm in Oct 2012. No further fu  . Right middle lobe pneumonia ([place]) 07/24/2011   First noted at admit 07/10/11. Persists on cxr 07/22/11. Cleared on CT 08/24/11. No further followup  . Stroke ([place])   . TOBACCO ABUSE 06/04/2009    Past Surgical History:  Procedure Laterality Date  . CARDIOVASCULAR STRESS TEST  12/26/2004   EF 74%. NO EVIDENCE OF ISCHEMIA  . ESOPHAGOGASTRODUODENOSCOPY (EGD) WITH PROPOFOL N/A 04/15/2015   Procedure: ESOPHAGOGASTRODUODENOSCOPY (EGD) WITH PROPOFOL;  Surgeon: Laurence Spates, MD;  Location: [facility];  Service: Endoscopy;  Laterality: N/A;  . KNEE ARTHROSCOPY  2000   left  . LAPAROSCOPIC CHOLECYSTECTOMY  04-16-2010   [facility]  . MOUTH SURGERY     03-26-15 multiple extractions stitches remains  . TOTAL ABDOMINAL HYSTERECTOMY     post op needed oxygen was told "she gave them a scare"  . TUBAL LIGATION    . US ECHOCARDIOGRAPHY   11/20/2009   EF 55-60%    History  Smoking Status  . Former Smoker  . Quit date: 06/02/2010  Smokeless Tobacco  . Never Used    History  Alcohol Use No    Family History  Problem Relation Age of Onset  . Dementia Mother   . Diabetes Mother   . Alzheimer's disease Mother   . Heart attack Brother 34  . Heart attack Father   . Schizophrenia Sister   .  Diabetes Sister   . Tremor Sister     Review of Systems: The review of systems is per the HPI.  All other systems were reviewed and are negative.  Physical Exam: BP 130/82   Pulse 94   Ht 5\' 4"  (1.626 m)   Wt 198 lb (89.8 kg)   BMI 33.99 kg/m  Patient is obese, alert and in NAD.  Skin is warm and dry. Color is normal.  HEENT is unremarkable. Normocephalic/atraumatic. PERRL. Sclera are nonicteric. Neck is supple. No masses. No JVD. Lungs are clear. CV RRR, No gallop or murmur.  She has marked pain on palpation of the sternum.  Abdomen is soft. Extremities are without edema.   Wt Readings from Last 3 Encounters:  12/28/16 198 lb (89.8 kg)  12/09/16 196 lb 9.6 oz (89.2 kg)  12/01/16 198 lb (89.8 kg)    LABORATORY DATA/PROCEDURES:    Lab Results  Component Value Date   WBC 5.7 12/09/2016   HGB 11.3 (L) 09/06/2016   HCT 36.6 12/09/2016   PLT 258 12/09/2016   GLUCOSE 114 (H) 12/09/2016   CHOL 207 (H) 12/09/2016   TRIG 221 (H) 12/09/2016   HDL 35 (L) 12/09/2016   LDLCALC 128 (H) 12/09/2016   ALT 17 12/09/2016   AST 17 12/09/2016   NA 139 12/09/2016   K 4.6 12/09/2016   CL 97 12/09/2016   CREATININE 0.95 12/09/2016   BUN 15 12/09/2016   CO2 24 12/09/2016   TSH 1.960 08/16/2015   INR 2.2 12/04/2016   HGBA1C 6.0 02/06/2016    BNP (last 3 results) No results for input(s): PROBNP in the last 8760 hours.   Carotid dopplers in August 2017 with stable disease.   Assessment / Plan: 1. PAF -Continue toprol for rate control. INR will be checked today. Continue Coumadin.  Recommend continuing current therapy.   2.  PFO - on anticoagulation.   3. COPD on home oxygen.  4. Hypertension -  Well controlled today and per her BP monitor.  5. Aortic root enlargement. 44 mm by Echo.   6. Abnormal Ecg with chronic T wave changes. Prior ischemic work up negative.  7. Acute costochondritis. Reassured her that her pain is not cardiac. Reproduced on exam. Not a candidate for NSAIDs due to coumadin. Continue analgesics prn.   Follow up  In 4 months.

## 2016-12-28 ENCOUNTER — Encounter: Payer: Self-pay | Admitting: Cardiology

## 2016-12-28 ENCOUNTER — Ambulatory Visit (INDEPENDENT_AMBULATORY_CARE_PROVIDER_SITE_OTHER): Payer: Medicare Other | Admitting: Cardiology

## 2016-12-28 VITALS — BP 130/82 | HR 94 | Ht 64.0 in | Wt 198.0 lb

## 2016-12-28 DIAGNOSIS — I480 Paroxysmal atrial fibrillation: Secondary | ICD-10-CM | POA: Diagnosis not present

## 2016-12-28 DIAGNOSIS — Z7901 Long term (current) use of anticoagulants: Secondary | ICD-10-CM

## 2016-12-28 DIAGNOSIS — I10 Essential (primary) hypertension: Secondary | ICD-10-CM | POA: Diagnosis not present

## 2016-12-28 DIAGNOSIS — M940 Chondrocostal junction syndrome [Tietze]: Secondary | ICD-10-CM | POA: Diagnosis not present

## 2016-12-28 DIAGNOSIS — E7800 Pure hypercholesterolemia, unspecified: Secondary | ICD-10-CM | POA: Insufficient documentation

## 2016-12-28 NOTE — Patient Instructions (Addendum)
Continue your current therapy  I will see you in 4 months  

## 2016-12-30 ENCOUNTER — Ambulatory Visit (HOSPITAL_COMMUNITY): Payer: Self-pay | Admitting: Psychiatry

## 2017-01-04 DIAGNOSIS — F411 Generalized anxiety disorder: Secondary | ICD-10-CM | POA: Insufficient documentation

## 2017-01-04 DIAGNOSIS — F331 Major depressive disorder, recurrent, moderate: Secondary | ICD-10-CM | POA: Insufficient documentation

## 2017-01-08 ENCOUNTER — Ambulatory Visit (INDEPENDENT_AMBULATORY_CARE_PROVIDER_SITE_OTHER): Payer: Medicare Other | Admitting: Pharmacist Clinician (PhC)/ Clinical Pharmacy Specialist

## 2017-01-08 DIAGNOSIS — I480 Paroxysmal atrial fibrillation: Secondary | ICD-10-CM

## 2017-01-08 DIAGNOSIS — Z7901 Long term (current) use of anticoagulants: Secondary | ICD-10-CM

## 2017-01-08 LAB — POCT INR: INR: 3.3

## 2017-01-13 ENCOUNTER — Encounter (HOSPITAL_COMMUNITY): Payer: Self-pay | Admitting: Psychiatry

## 2017-01-13 ENCOUNTER — Ambulatory Visit (INDEPENDENT_AMBULATORY_CARE_PROVIDER_SITE_OTHER): Payer: Medicare Other | Admitting: Psychiatry

## 2017-01-13 DIAGNOSIS — Z810 Family history of intellectual disabilities: Secondary | ICD-10-CM

## 2017-01-13 DIAGNOSIS — Z818 Family history of other mental and behavioral disorders: Secondary | ICD-10-CM | POA: Diagnosis not present

## 2017-01-13 DIAGNOSIS — F0630 Mood disorder due to known physiological condition, unspecified: Secondary | ICD-10-CM | POA: Diagnosis not present

## 2017-01-13 DIAGNOSIS — F332 Major depressive disorder, recurrent severe without psychotic features: Secondary | ICD-10-CM | POA: Diagnosis not present

## 2017-01-13 DIAGNOSIS — F419 Anxiety disorder, unspecified: Secondary | ICD-10-CM | POA: Diagnosis not present

## 2017-01-13 DIAGNOSIS — Z87891 Personal history of nicotine dependence: Secondary | ICD-10-CM

## 2017-01-13 DIAGNOSIS — F4310 Post-traumatic stress disorder, unspecified: Secondary | ICD-10-CM

## 2017-01-13 MED ORDER — TRAZODONE HCL 100 MG PO TABS: 100.0000 mg | ORAL_TABLET | Freq: Every day | ORAL | 2 refills | Status: DC

## 2017-01-13 MED ORDER — DESVENLAFAXINE SUCCINATE ER 50 MG PO TB24: 50.0000 mg | ORAL_TABLET | Freq: Every day | ORAL | 2 refills | Status: DC

## 2017-01-13 NOTE — Progress Notes (Signed)
BH MD/PA/NP OP Progress Note  01/13/2017 11:18 AM Laurie Horton  MRN:  [id number]  Chief Complaint:  Subjective:  I was diagnosed with fibromyalgia.  I'm doing okay.  HPI: Laurie Horton came for her follow-up appointment.  She has been seeing multiple providers for her chronic health issues.  Recently she was diagnosed with fibromyalgia.  She endorse chronic pain and she is getting pain medication from Dr. Florene Glen.  She was also seen pulmonologist because she continues to have chest pain and then she was recommended to see a cardiologist who told that she has costochondritis.  Patient endorsed that she does not like cold weather because it causes worsening of the pain.  Overall she described her depression is a stable.  Some nights she has difficulty sleeping but denies any agitation, anger, mania, psychosis or any hallucination.  She denies any self abusive behavior.  She rarely takes Valium because she does not want to get addicted.  She still has refill remaining.  She denies any nightmares and flashback.  She likes Abilify which is helping her mood and irritability.  She also compliant with Pristiq.  She has no tremors shakes or any EPS.  She like to get counseling but she could not afford at this time.  She has a very good neighbor who she is very close and sometimes she talks to her which helps.  Her sleep is okay.  Her energy level is fair.  Her vital signs are stable.  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 Patient denies any previous history of psychiatric inpatient treatment, suicidal attempt, paranoia, hallucination or any mania. She started seeing psychiatrist in 2006 after she had a stroke. She had tried Cymbalta and Celexa in the past. She also tried Ambien, Lunesta and Xanax but developed allergies and side  effects  Past Medical History:  Past Medical History:  Diagnosis Date  . Complication of anesthesia    various issues with oxygen  saturations post op  . COPD (chronic obstructive pulmonary disease) ([place])   . Depression   . Diverticulitis   . DVT (deep venous thrombosis) ([place])   . Fibromyalgia   . GERD (gastroesophageal reflux disease)   . Hiatal hernia   . History of deviated nasal septum    left- side  . HTN (hypertension)   . Hyperlipidemia   . Obesity   . On supplemental oxygen therapy    concentrator at night @ 1.5 l/m or when sleeps. O2 Sat niormally 87.  . Osteoporosis   . PAT (paroxysmal atrial tachycardia) ([place])   . PFO (patent foramen ovale)   . PULMONARY NODULE, LEFT LOWER LOBE 10/14/2009   57mm LLL nodule dec 2010. Stable and 48mm in Oct 2012. No further fu  . Right middle lobe pneumonia ([place]) 07/24/2011   First noted at admit 07/10/11. Persists on cxr 07/22/11. Cleared on CT 08/24/11. No further followup  . Stroke ([place])   . TOBACCO ABUSE 06/04/2009    Past Surgical History:  Procedure Laterality Date  . CARDIOVASCULAR STRESS TEST  12/26/2004   EF 74%. NO EVIDENCE OF ISCHEMIA  . ESOPHAGOGASTRODUODENOSCOPY (EGD) WITH PROPOFOL N/A 04/15/2015   Procedure: ESOPHAGOGASTRODUODENOSCOPY (EGD) WITH PROPOFOL;  Surgeon: Laurence Spates, MD;  Location: [facility];  Service: Endoscopy;  Laterality: N/A;  . KNEE ARTHROSCOPY  2000  left  . LAPAROSCOPIC CHOLECYSTECTOMY  04-16-2010   [place]  . MOUTH SURGERY     03-26-15 multiple extractions stitches remains  . TOTAL ABDOMINAL HYSTERECTOMY     post op needed oxygen was told "she gave them a scare"  . TUBAL LIGATION    . US ECHOCARDIOGRAPHY  11/20/2009   EF 55-60%    Family Psychiatric History: Reviewed.  Family History:  Family History  Problem Relation Age of Onset  . Dementia Mother   . Diabetes Mother   . Alzheimer's disease Mother   . Heart attack Brother 59  . Heart attack Father   . Schizophrenia Sister   . Diabetes  Sister   . Tremor Sister     Social History:  Social History   Social History  . Marital status: Divorced    Spouse name: N/A  . Number of children: 2  . Years of education: N/A   Occupational History  . disability    Social History Main Topics  . Smoking status: Former Smoker    Quit date: 06/02/2010  . Smokeless tobacco: Never Used  . Alcohol use No  . Drug use: No  . Sexual activity: Yes   Other Topics Concern  . Not on file   Social History Narrative  . No narrative on file    Allergies:  Allergies  Allergen Reactions  . Alprazolam     REACTION: stops breathing  . Pseudoephedrine Hcl Er Shortness Of Breath  . Budesonide-Formoterol Fumarate     bliusters  . Aciphex [Rabeprazole Sodium]     rash  . Avelox [Moxifloxacin Hcl In Nacl]     Stomach cramps.   . Crestor [Rosuvastatin Calcium] Other (See Comments)    Unable to walk  . Esomeprazole Magnesium     REACTION: "bouncing off walls"  . Flonase [Fluticasone Propionate]   . Iodine     REACTION: swelling in throat  . Loratadine     claritin D causes shaking  . Lotrimin [Clotrimazole] Other (See Comments)    blisters  . Lunesta [Eszopiclone]     REACTION: "slept for a week"  . Other     Glue from ekg/heart monitor leads --rash +  Any MYCINS  . Oxcarbazepine Other (See Comments)    Causes deep sleep and dizziness  . Statins Other (See Comments)  . Zolpidem Tartrate     REACTION: "slept for a week"  . Bextra [Valdecoxib] Rash  . Ceclor [Cefaclor] Rash  . Covera-Hs [Verapamil Hcl] Palpitations  . Dicyclomine Hcl Rash  . Tessalon Perles Rash    Metabolic Disorder Labs: Recent Results (from the past 2160 hour(s))  POCT INR     Status: None   Collection Time: 10/16/16  8:38 AM  Result Value Ref Range   INR 3.2   POCT INR     Status: None   Collection Time: 11/06/16  1:44 PM  Result Value Ref Range   INR 2.3   POCT INR     Status: None   Collection Time: 12/04/16 12:05 PM  Result Value Ref  Range   INR 2.2   CBC with Differential/Platelet     Status: Abnormal   Collection Time: 12/09/16  1:28 PM  Result Value Ref Range   WBC 5.7 3.4 - 10.8 x10E3/uL   RBC 4.32 3.77 - 5.28 x10E6/uL   Hemoglobin 11.8 11.1 - 15.9 g/dL   Hematocrit 36.6 34.0 - 46.6 %   MCV 85 79 - 97 fL   MCH 27.3  26.6 - 33.0 pg   MCHC 32.2 31.5 - 35.7 g/dL   RDW 17.1 (H) 12.3 - 15.4 %   Platelets 258 150 - 379 x10E3/uL   Neutrophils 59 Not Estab. %   Lymphs 28 Not Estab. %   Monocytes 11 Not Estab. %   Eos 2 Not Estab. %   Basos 0 Not Estab. %   Neutrophils Absolute 3.4 1.4 - 7.0 x10E3/uL   Lymphocytes Absolute 1.6 0.7 - 3.1 x10E3/uL   Monocytes Absolute 0.6 0.1 - 0.9 x10E3/uL   EOS (ABSOLUTE) 0.1 0.0 - 0.4 x10E3/uL   Basophils Absolute 0.0 0.0 - 0.2 x10E3/uL   Immature Granulocytes 0 Not Estab. %   Immature Grans (Abs) 0.0 0.0 - 0.1 x10E3/uL  Comprehensive metabolic panel     Status: Abnormal   Collection Time: 12/09/16  1:28 PM  Result Value Ref Range   Glucose 114 (H) 65 - 99 mg/dL   BUN 15 8 - 27 mg/dL   Creatinine, Ser 0.95 0.57 - 1.00 mg/dL   GFR calc non Af Amer 60 >59 mL/min/1.73   GFR calc Af Amer 70 >59 mL/min/1.73   BUN/Creatinine Ratio 16 12 - 28   Sodium 139 134 - 144 mmol/L   Potassium 4.6 3.5 - 5.2 mmol/L   Chloride 97 96 - 106 mmol/L   CO2 24 18 - 29 mmol/L   Calcium 10.0 8.7 - 10.3 mg/dL   Total Protein 6.5 6.0 - 8.5 g/dL   Albumin 4.2 3.5 - 4.8 g/dL   Globulin, Total 2.3 1.5 - 4.5 g/dL   Albumin/Globulin Ratio 1.8 1.2 - 2.2   Bilirubin Total 0.2 0.0 - 1.2 mg/dL   Alkaline Phosphatase 90 39 - 117 IU/L   AST 17 0 - 40 IU/L   ALT 17 0 - 32 IU/L  Lipid panel     Status: Abnormal   Collection Time: 12/09/16  1:28 PM  Result Value Ref Range   Cholesterol, Total 207 (H) 100 - 199 mg/dL   Triglycerides 221 (H) 0 - 149 mg/dL   HDL 35 (L) >39 mg/dL   VLDL Cholesterol Cal 44 (H) 5 - 40 mg/dL   LDL Calculated 128 (H) 0 - 99 mg/dL   Chol/HDL Ratio 5.9 (H) 0.0 - 4.4 ratio units     Comment:                                   T. Chol/HDL Ratio                                             Men  Women                               1/2 Avg.Risk  3.4    3.3                                   Avg.Risk  5.0    4.4                                2X Avg.Risk  9.6    7.1  3X Avg.Risk 23.4   11.0   POCT INR     Status: None   Collection Time: 01/08/17  8:11 AM  Result Value Ref Range   INR 3.3    Lab Results  Component Value Date   HGBA1C 6.0 02/06/2016   MPG 120 (H) 05/04/2013   No results found for: PROLACTIN Lab Results  Component Value Date   CHOL 207 (H) 12/09/2016   TRIG 221 (H) 12/09/2016   HDL 35 (L) 12/09/2016   CHOLHDL 5.9 (H) 12/09/2016   VLDL 25 07/02/2016   LDLCALC 128 (H) 12/09/2016   LDLCALC 155 (H) 07/02/2016     Current Medications: Current Outpatient Prescriptions  Medication Sig Dispense Refill  . desvenlafaxine (PRISTIQ) 50 MG 24 hr tablet Take 1 tablet (50 mg total) by mouth daily. 30 tablet 2  . diazepam (VALIUM) 2 MG tablet Take 2 mg by mouth every 6 (six) hours as needed for anxiety.    . gabapentin (NEURONTIN) 100 MG capsule Take 100 mg by mouth 4 (four) times daily.     Marland Kitchen HYDROcodone-acetaminophen (NORCO/VICODIN) 5-325 MG tablet Take 1 tablet by mouth 2 (two) times daily.     Marland Kitchen ipratropium (ATROVENT) 0.02 % nebulizer solution Inhale 1 mL into the lungs every 4 (four) hours as needed.     . metoprolol succinate (TOPROL-XL) 25 MG 24 hr tablet Take 1/2 tablet by mouth daily    . montelukast (SINGULAIR) 10 MG tablet TAKE 1 TABLET BY MOUTH AT BEDTIME 90 tablet 0  . ondansetron (ZOFRAN ODT) 8 MG disintegrating tablet Take 1 tablet (8 mg total) by mouth every 8 (eight) hours as needed for nausea or vomiting. 20 tablet 0  . oxybutynin (DITROPAN-XL) 10 MG 24 hr tablet Take 10 mg by mouth at bedtime.     . OXYGEN Inhale 1.5 L into the lungs. For naps and sleep.    . pantoprazole (PROTONIX) 40 MG tablet Take 1 tablet by  mouth every morning.     . polyethylene glycol (MIRALAX / GLYCOLAX) packet Take 17 g by mouth daily as needed for mild constipation.     . rosuvastatin (CRESTOR) 5 MG tablet Take 5 mg by mouth once a week. Sunday  3  . tiZANidine (ZANAFLEX) 2 MG tablet Take by mouth every 6 (six) hours as needed for muscle spasms.    . traZODone (DESYREL) 100 MG tablet Take 1 tablet (100 mg total) by mouth at bedtime. 30 tablet 2  . warfarin (COUMADIN) 5 MG tablet TAKE 1/2 TO 1 TABLET BY MOUTH EVERY DAY OR AS DIRECTED BY COUMADIN CLINIC (FILLABLE 09/02/16) 30 tablet 1   No current facility-administered medications for this visit.     Neurologic: Headache: No Seizure: No Paresthesias: No  Musculoskeletal: Strength & Muscle Tone: within normal limits Gait & Station: normal Patient leans: N/A  Psychiatric Specialty Exam: ROS  There were no vitals taken for this visit.There is no height or weight on file to calculate BMI.  General Appearance: Casual  Eye Contact:  Good  Speech:  Clear and Coherent  Volume:  Normal  Mood:  Anxious  Affect:  Appropriate  Thought Process:  Goal Directed  Orientation:  Full (Time, Place, and Person)  Thought Content: Logical and Rumination   Suicidal Thoughts:  No  Homicidal Thoughts:  No  Memory:  Immediate;   Fair Recent;   Fair Remote;   Fair  Judgement:  Good  Insight:  Good  Psychomotor Activity:  Normal  Concentration:  Concentration:  Good and Attention Span: Good  Recall:  Good  Fund of Knowledge: Good  Language: Good  Akathisia:  No  Handed:  Right  AIMS (if indicated):  0  Assets:  Agricultural consultant Housing Resilience  ADL's:  Intact  Cognition: Impaired,  Mild  Sleep:  fair    Assessment: Major depressive disorder, recurrent.  Posttraumatic stress disorder.  Anxiety disorder NOS.  Plan: Reassurance given.  Patient does not want to change her medication.  She like to continue her current psychiatric  medication which is Pristiq 50 mg daily and trazodone 100 mg at bedtime.  She daily takes Valium and does not need a new prescription at this time.  She is also getting Neurontin, hydrocodone and muscle relaxant from Dr. Florene Glen.  Discussed polypharmacy and medication side effects.  Recommended to call us back if she has any question, concern or if she feels worsening of the symptom.  Follow-up in 3 months.  Vyom Brass T., MD 01/13/2017, 11:18 AM

## 2017-01-19 ENCOUNTER — Telehealth: Payer: Self-pay | Admitting: Internal Medicine

## 2017-01-20 ENCOUNTER — Telehealth: Payer: Self-pay | Admitting: Cardiology

## 2017-01-20 MED ORDER — PREDNISONE 10 MG PO TABS: ORAL_TABLET | ORAL | 0 refills | Status: DC

## 2017-01-20 MED ORDER — CLARITHROMYCIN 500 MG PO TABS: 500.0000 mg | ORAL_TABLET | Freq: Two times a day (BID) | ORAL | 0 refills | Status: DC

## 2017-01-20 MED ORDER — METOPROLOL SUCCINATE ER 25 MG PO TB24: ORAL_TABLET | ORAL | 6 refills | Status: DC

## 2017-01-20 NOTE — Telephone Encounter (Signed)
Pleas do biaxin 500mg  bid x 5 days. Please add cephaclor to allergy list with rash - iff not done already

## 2017-01-20 NOTE — Telephone Encounter (Signed)
Pt aware that Biaxin approved per MR and this has been sent to [facility] with the Prednisone.  Pt has made INR clinic aware that she is taking an abx and prednisone.  Nothing further needed.

## 2017-01-20 NOTE — Telephone Encounter (Signed)
She has aecopd  Plan  - definitely need antibiotic - cephalexin 500mg  tid x 5 days; ensure there is no allergy  - Please take prednisone 40 mg x1 day, then 30 mg x1 day, then 20 mg x1 day, then 10 mg x1 day, and then 5 mg x1 day and stop (if she does not want to take it then she should monitor herself for monitoring)     Allergies  Allergen Reactions  . Alprazolam     REACTION: stops breathing  . Pseudoephedrine Hcl Er Shortness Of Breath  . Budesonide-Formoterol Fumarate     bliusters  . Aciphex [Rabeprazole Sodium]     rash  . Avelox [Moxifloxacin Hcl In Nacl]     Stomach cramps.   . Crestor [Rosuvastatin Calcium] Other (See Comments)    Unable to walk  . Esomeprazole Magnesium     REACTION: "bouncing off walls"  . Flonase [Fluticasone Propionate]   . Iodine     REACTION: swelling in throat  . Loratadine     claritin D causes shaking  . Lotrimin [Clotrimazole] Other (See Comments)    blisters  . Lunesta [Eszopiclone]     REACTION: "slept for a week"  . Other     Glue from ekg/heart monitor leads --rash +  Any MYCINS  . Oxcarbazepine Other (See Comments)    Causes deep sleep and dizziness  . Statins Other (See Comments)  . Zolpidem Tartrate     REACTION: "slept for a week"  . Bextra [Valdecoxib] Rash  . Ceclor [Cefaclor] Rash  . Covera-Hs [Verapamil Hcl] Palpitations  . Dicyclomine Hcl Rash  . Tessalon Perles Rash

## 2017-01-20 NOTE — Telephone Encounter (Signed)
Spoke with pt, who states she was dx with cosochdritis by Dr. Martinique. Dr. Martinique recommended prednisone, pt refused prednisone. Pt developed prod cough with yellow mucus, sob with exertion x1w pt seen Dr. Greta Doom yesterday who recommended that pt reach out to MR.  Denies fever, chills or sweats.  MR please advise. Thanks.

## 2017-01-20 NOTE — Telephone Encounter (Signed)
Returned call to patient.She stated pharmacy does not have correct directions for toprol.Stated she takes 25 mg 1/2 tablet daily.New prescription sent to pharmacy.

## 2017-01-20 NOTE — Telephone Encounter (Signed)
Pt aware of recommendations per MR Pt is going to contact her Coumadin clinic to let them know that she is being prescribed Cephalexin 500mg  TID just in case there is any concern with this interacting too much with her Coumadin.  [facility]one [facility].  Drug class interaction alert popped up for Cephalexin and previous reaction to Cefaclor -- Rash and severe hives per the patient.  Pt is requesting Biaxin if possible and if Dr [name] feels that abx will work to treat symptoms.   Please advise Dr [name]. Thanks.

## 2017-01-20 NOTE — Telephone Encounter (Signed)
Please call,she needs you to straighten the confusion about her medicine.

## 2017-01-25 ENCOUNTER — Ambulatory Visit (INDEPENDENT_AMBULATORY_CARE_PROVIDER_SITE_OTHER): Payer: Medicare Other | Admitting: Pharmacist Clinician (PhC)/ Clinical Pharmacy Specialist

## 2017-01-25 DIAGNOSIS — Z7901 Long term (current) use of anticoagulants: Secondary | ICD-10-CM | POA: Diagnosis not present

## 2017-01-25 DIAGNOSIS — I480 Paroxysmal atrial fibrillation: Secondary | ICD-10-CM

## 2017-01-25 LAB — POCT INR: INR: 2.5

## 2017-02-01 NOTE — Progress Notes (Signed)
This encounter was created in error - please disregard.

## 2017-02-04 ENCOUNTER — Other Ambulatory Visit: Payer: Self-pay | Admitting: Adult Health

## 2017-02-08 ENCOUNTER — Emergency Department (HOSPITAL_COMMUNITY): Payer: Medicare Other

## 2017-02-08 ENCOUNTER — Ambulatory Visit (INDEPENDENT_AMBULATORY_CARE_PROVIDER_SITE_OTHER): Payer: Medicare Other

## 2017-02-08 ENCOUNTER — Inpatient Hospital Stay (HOSPITAL_COMMUNITY)
Admission: EM | Admit: 2017-02-08 | Discharge: 2017-02-12 | DRG: 291 | Disposition: A | Discharge status: Home Health | Payer: Medicare Other | Attending: Internal Medicine | Admitting: Internal Medicine

## 2017-02-08 ENCOUNTER — Encounter (HOSPITAL_COMMUNITY): Payer: Self-pay | Admitting: *Deleted

## 2017-02-08 ENCOUNTER — Ambulatory Visit (INDEPENDENT_AMBULATORY_CARE_PROVIDER_SITE_OTHER): Payer: Medicare Other | Admitting: Emergency Medicine

## 2017-02-08 VITALS — BP 122/84 | HR 92 | Temp 99.0°F | Resp 17 | Ht 64.0 in | Wt 196.0 lb

## 2017-02-08 DIAGNOSIS — Z833 Family history of diabetes mellitus: Secondary | ICD-10-CM

## 2017-02-08 DIAGNOSIS — Z8249 Family history of ischemic heart disease and other diseases of the circulatory system: Secondary | ICD-10-CM | POA: Diagnosis not present

## 2017-02-08 DIAGNOSIS — I10 Essential (primary) hypertension: Secondary | ICD-10-CM

## 2017-02-08 DIAGNOSIS — Z79899 Other long term (current) drug therapy: Secondary | ICD-10-CM | POA: Diagnosis not present

## 2017-02-08 DIAGNOSIS — I110 Hypertensive heart disease with heart failure: Principal | ICD-10-CM | POA: Diagnosis present

## 2017-02-08 DIAGNOSIS — Z888 Allergy status to other drugs, medicaments and biological substances status: Secondary | ICD-10-CM | POA: Diagnosis not present

## 2017-02-08 DIAGNOSIS — R42 Dizziness and giddiness: Secondary | ICD-10-CM | POA: Diagnosis present

## 2017-02-08 DIAGNOSIS — R0602 Shortness of breath: Secondary | ICD-10-CM

## 2017-02-08 DIAGNOSIS — Z6832 Body mass index (BMI) 32.0-32.9, adult: Secondary | ICD-10-CM | POA: Diagnosis not present

## 2017-02-08 DIAGNOSIS — I5031 Acute diastolic (congestive) heart failure: Secondary | ICD-10-CM | POA: Diagnosis present

## 2017-02-08 DIAGNOSIS — I509 Heart failure, unspecified: Secondary | ICD-10-CM

## 2017-02-08 DIAGNOSIS — Z7901 Long term (current) use of anticoagulants: Secondary | ICD-10-CM | POA: Diagnosis not present

## 2017-02-08 DIAGNOSIS — I480 Paroxysmal atrial fibrillation: Secondary | ICD-10-CM

## 2017-02-08 DIAGNOSIS — J441 Chronic obstructive pulmonary disease with (acute) exacerbation: Secondary | ICD-10-CM | POA: Diagnosis not present

## 2017-02-08 DIAGNOSIS — Z820 Family history of epilepsy and other diseases of the nervous system: Secondary | ICD-10-CM | POA: Diagnosis not present

## 2017-02-08 DIAGNOSIS — Z881 Allergy status to other antibiotic agents status: Secondary | ICD-10-CM | POA: Diagnosis not present

## 2017-02-08 DIAGNOSIS — F411 Generalized anxiety disorder: Secondary | ICD-10-CM | POA: Diagnosis present

## 2017-02-08 DIAGNOSIS — Z818 Family history of other mental and behavioral disorders: Secondary | ICD-10-CM

## 2017-02-08 DIAGNOSIS — E669 Obesity, unspecified: Secondary | ICD-10-CM | POA: Diagnosis present

## 2017-02-08 DIAGNOSIS — J439 Emphysema, unspecified: Secondary | ICD-10-CM | POA: Diagnosis present

## 2017-02-08 DIAGNOSIS — Q2111 Secundum atrial septal defect: Secondary | ICD-10-CM

## 2017-02-08 DIAGNOSIS — Z9049 Acquired absence of other specified parts of digestive tract: Secondary | ICD-10-CM

## 2017-02-08 DIAGNOSIS — Q211 Atrial septal defect: Secondary | ICD-10-CM | POA: Diagnosis not present

## 2017-02-08 DIAGNOSIS — Z87891 Personal history of nicotine dependence: Secondary | ICD-10-CM

## 2017-02-08 DIAGNOSIS — M797 Fibromyalgia: Secondary | ICD-10-CM | POA: Diagnosis present

## 2017-02-08 DIAGNOSIS — G8929 Other chronic pain: Secondary | ICD-10-CM | POA: Diagnosis present

## 2017-02-08 DIAGNOSIS — J449 Chronic obstructive pulmonary disease, unspecified: Secondary | ICD-10-CM | POA: Diagnosis not present

## 2017-02-08 DIAGNOSIS — J9621 Acute and chronic respiratory failure with hypoxia: Secondary | ICD-10-CM | POA: Diagnosis present

## 2017-02-08 DIAGNOSIS — I4891 Unspecified atrial fibrillation: Secondary | ICD-10-CM

## 2017-02-08 DIAGNOSIS — E7800 Pure hypercholesterolemia, unspecified: Secondary | ICD-10-CM | POA: Diagnosis not present

## 2017-02-08 DIAGNOSIS — Z66 Do not resuscitate: Secondary | ICD-10-CM | POA: Diagnosis present

## 2017-02-08 DIAGNOSIS — K219 Gastro-esophageal reflux disease without esophagitis: Secondary | ICD-10-CM | POA: Diagnosis present

## 2017-02-08 DIAGNOSIS — Z91048 Other nonmedicinal substance allergy status: Secondary | ICD-10-CM

## 2017-02-08 DIAGNOSIS — I340 Nonrheumatic mitral (valve) insufficiency: Secondary | ICD-10-CM | POA: Diagnosis not present

## 2017-02-08 DIAGNOSIS — I493 Ventricular premature depolarization: Secondary | ICD-10-CM | POA: Diagnosis present

## 2017-02-08 DIAGNOSIS — F329 Major depressive disorder, single episode, unspecified: Secondary | ICD-10-CM | POA: Diagnosis present

## 2017-02-08 DIAGNOSIS — M810 Age-related osteoporosis without current pathological fracture: Secondary | ICD-10-CM | POA: Diagnosis present

## 2017-02-08 DIAGNOSIS — I5043 Acute on chronic combined systolic (congestive) and diastolic (congestive) heart failure: Secondary | ICD-10-CM | POA: Diagnosis present

## 2017-02-08 DIAGNOSIS — Z79891 Long term (current) use of opiate analgesic: Secondary | ICD-10-CM

## 2017-02-08 DIAGNOSIS — Z9981 Dependence on supplemental oxygen: Secondary | ICD-10-CM | POA: Diagnosis not present

## 2017-02-08 DIAGNOSIS — Z90710 Acquired absence of both cervix and uterus: Secondary | ICD-10-CM

## 2017-02-08 DIAGNOSIS — Z8673 Personal history of transient ischemic attack (TIA), and cerebral infarction without residual deficits: Secondary | ICD-10-CM

## 2017-02-08 HISTORY — DX: Heart failure, unspecified: I50.9

## 2017-02-08 HISTORY — DX: Acute diastolic (congestive) heart failure: I50.31

## 2017-02-08 LAB — I-STAT TROPONIN, ED: TROPONIN I, POC: 0.01 ng/mL (ref 0.00–0.08)

## 2017-02-08 LAB — BASIC METABOLIC PANEL
ANION GAP: 8 (ref 5–15)
BUN: 10 mg/dL (ref 6–20)
CHLORIDE: 103 mmol/L (ref 101–111)
CO2: 27 mmol/L (ref 22–32)
Calcium: 9.2 mg/dL (ref 8.9–10.3)
Creatinine, Ser: 0.93 mg/dL (ref 0.44–1.00)
GFR calc non Af Amer: >60 mL/min (ref >60)
Glucose, Bld: 137 mg/dL — ABNORMAL HIGH (ref 65–99)
POTASSIUM: 3.8 mmol/L (ref 3.5–5.1)
Sodium: 138 mmol/L (ref 135–145)

## 2017-02-08 LAB — CBC
HCT: 33.4 % — ABNORMAL LOW (ref 36.0–46.0)
HEMOGLOBIN: 10.3 g/dL — AB (ref 12.0–15.0)
MCH: 27 pg (ref 26.0–34.0)
MCHC: 30.8 g/dL (ref 30.0–36.0)
MCV: 87.7 fL (ref 78.0–100.0)
Platelets: 239 10*3/uL (ref 150–400)
RBC: 3.81 MIL/uL — AB (ref 3.87–5.11)
RDW: 15.3 % (ref 11.5–15.5)
WBC: 6.3 10*3/uL (ref 4.0–10.5)

## 2017-02-08 LAB — MAGNESIUM: Magnesium: 1.8 mg/dL (ref 1.7–2.4)

## 2017-02-08 LAB — PROTIME-INR
INR: 2.2
Prothrombin Time: 24.8 seconds — ABNORMAL HIGH (ref 11.4–15.2)

## 2017-02-08 LAB — BRAIN NATRIURETIC PEPTIDE: B Natriuretic Peptide: 685.9 pg/mL — ABNORMAL HIGH (ref 0.0–100.0)

## 2017-02-08 LAB — I-STAT CG4 LACTIC ACID, ED: Lactic Acid, Venous: 1.63 mmol/L (ref 0.5–1.9)

## 2017-02-08 LAB — PROCALCITONIN

## 2017-02-08 MED ORDER — FUROSEMIDE 10 MG/ML IJ SOLN
40.0000 mg | Freq: Two times a day (BID) | INTRAMUSCULAR | Status: DC
Start: 2017-02-08 — End: 2017-02-09
  Administered 2017-02-08: 40 mg via INTRAVENOUS
  Filled 2017-02-08: qty 4

## 2017-02-08 MED ORDER — IPRATROPIUM BROMIDE 0.02 % IN SOLN: 0.2500 mg | Freq: Four times a day (QID) | RESPIRATORY_TRACT | Status: DC | PRN

## 2017-02-08 MED ORDER — HYDROCODONE-ACETAMINOPHEN 5-325 MG PO TABS
1.0000 | ORAL_TABLET | Freq: Two times a day (BID) | ORAL | Status: DC
  Administered 2017-02-08 – 2017-02-10 (×5): 1 via ORAL
  Filled 2017-02-08 (×7): qty 1

## 2017-02-08 MED ORDER — SODIUM CHLORIDE 0.9% FLUSH
3.0000 mL | INTRAVENOUS | Status: DC | PRN
Start: 2017-02-08 — End: 2017-02-12

## 2017-02-08 MED ORDER — GABAPENTIN 100 MG PO CAPS
100.0000 mg | ORAL_CAPSULE | Freq: Four times a day (QID) | ORAL | Status: DC
  Administered 2017-02-08 – 2017-02-12 (×14): 100 mg via ORAL
  Filled 2017-02-08 (×15): qty 1

## 2017-02-08 MED ORDER — SODIUM CHLORIDE 0.9% FLUSH
3.0000 mL | Freq: Two times a day (BID) | INTRAVENOUS | Status: DC
  Administered 2017-02-08 – 2017-02-11 (×6): 3 mL via INTRAVENOUS

## 2017-02-08 MED ORDER — POLYETHYLENE GLYCOL 3350 17 G PO PACK
17.0000 g | PACK | Freq: Every day | ORAL | Status: DC | PRN
  Administered 2017-02-10 – 2017-02-11 (×2): 17 g via ORAL
  Filled 2017-02-08 (×2): qty 1

## 2017-02-08 MED ORDER — MONTELUKAST SODIUM 10 MG PO TABS
10.0000 mg | ORAL_TABLET | Freq: Every day | ORAL | Status: DC
  Administered 2017-02-09 – 2017-02-12 (×4): 10 mg via ORAL
  Filled 2017-02-08 (×4): qty 1

## 2017-02-08 MED ORDER — WARFARIN SODIUM 5 MG PO TABS
5.0000 mg | ORAL_TABLET | Freq: Once | ORAL | Status: AC
  Administered 2017-02-08: 5 mg via ORAL
  Filled 2017-02-08: qty 1

## 2017-02-08 MED ORDER — POTASSIUM CHLORIDE CRYS ER 20 MEQ PO TBCR
20.0000 meq | EXTENDED_RELEASE_TABLET | Freq: Two times a day (BID) | ORAL | Status: AC
  Administered 2017-02-08 – 2017-02-09 (×2): 20 meq via ORAL
  Filled 2017-02-08 (×2): qty 1

## 2017-02-08 MED ORDER — OXYBUTYNIN CHLORIDE ER 10 MG PO TB24
10.0000 mg | ORAL_TABLET | Freq: Every day | ORAL | Status: DC
  Administered 2017-02-09 – 2017-02-12 (×4): 10 mg via ORAL
  Filled 2017-02-08 (×4): qty 1

## 2017-02-08 MED ORDER — GABAPENTIN 100 MG PO CAPS
100.0000 mg | ORAL_CAPSULE | Freq: Once | ORAL | Status: AC
  Administered 2017-02-08: 100 mg via ORAL
  Filled 2017-02-08: qty 1

## 2017-02-08 MED ORDER — TRAZODONE HCL 100 MG PO TABS
100.0000 mg | ORAL_TABLET | Freq: Every day | ORAL | Status: DC
  Administered 2017-02-08 – 2017-02-11 (×4): 100 mg via ORAL
  Filled 2017-02-08 (×4): qty 1

## 2017-02-08 MED ORDER — MONTELUKAST SODIUM 10 MG PO TABS: 10.0000 mg | ORAL_TABLET | Freq: Every day | ORAL | Status: DC

## 2017-02-08 MED ORDER — FUROSEMIDE 10 MG/ML IJ SOLN
40.0000 mg | Freq: Once | INTRAMUSCULAR | Status: AC
  Administered 2017-02-08: 40 mg via INTRAVENOUS
  Filled 2017-02-08: qty 4

## 2017-02-08 MED ORDER — PANTOPRAZOLE SODIUM 40 MG PO TBEC
40.0000 mg | DELAYED_RELEASE_TABLET | Freq: Every morning | ORAL | Status: DC
  Administered 2017-02-09 – 2017-02-12 (×4): 40 mg via ORAL
  Filled 2017-02-08 (×4): qty 1

## 2017-02-08 MED ORDER — VENLAFAXINE HCL ER 75 MG PO CP24: 75.0000 mg | ORAL_CAPSULE | Freq: Every day | ORAL | Status: DC

## 2017-02-08 MED ORDER — SODIUM CHLORIDE 0.9 % IV SOLN: 250.0000 mL | INTRAVENOUS | Status: DC | PRN

## 2017-02-08 MED ORDER — HYDROCODONE-ACETAMINOPHEN 5-325 MG PO TABS
1.0000 | ORAL_TABLET | Freq: Once | ORAL | Status: AC
  Administered 2017-02-08: 1 via ORAL
  Filled 2017-02-08: qty 1

## 2017-02-08 MED ORDER — WARFARIN - PHARMACIST DOSING INPATIENT
Freq: Every day | Status: DC
Start: 2017-02-08 — End: 2017-02-12

## 2017-02-08 MED ORDER — ACETAMINOPHEN 325 MG PO TABS
650.0000 mg | ORAL_TABLET | ORAL | Status: DC | PRN
  Administered 2017-02-08: 650 mg via ORAL
  Filled 2017-02-08: qty 2

## 2017-02-08 MED ORDER — ONDANSETRON HCL 4 MG/2ML IJ SOLN
4.0000 mg | Freq: Four times a day (QID) | INTRAMUSCULAR | Status: DC | PRN
  Administered 2017-02-09: 4 mg via INTRAVENOUS
  Filled 2017-02-08: qty 2

## 2017-02-08 MED ORDER — DIAZEPAM 2 MG PO TABS
2.0000 mg | ORAL_TABLET | Freq: Four times a day (QID) | ORAL | Status: DC | PRN
  Administered 2017-02-08 – 2017-02-09 (×2): 2 mg via ORAL
  Filled 2017-02-08 (×2): qty 1

## 2017-02-08 MED ORDER — TIZANIDINE HCL 4 MG PO TABS: 2.0000 mg | ORAL_TABLET | Freq: Four times a day (QID) | ORAL | Status: DC | PRN

## 2017-02-08 MED ORDER — METOPROLOL SUCCINATE ER 25 MG PO TB24
12.5000 mg | ORAL_TABLET | Freq: Every day | ORAL | Status: DC
  Administered 2017-02-08 – 2017-02-10 (×3): 12.5 mg via ORAL
  Filled 2017-02-08 (×4): qty 1

## 2017-02-08 MED ORDER — OXYBUTYNIN CHLORIDE ER 10 MG PO TB24
10.0000 mg | ORAL_TABLET | Freq: Every day | ORAL | Status: DC
  Filled 2017-02-08: qty 1

## 2017-02-08 MED ORDER — ROSUVASTATIN CALCIUM 10 MG PO TABS: 5.0000 mg | ORAL_TABLET | ORAL | Status: DC

## 2017-02-08 NOTE — Progress Notes (Signed)
ANTICOAGULATION CONSULT NOTE - Initial Consult  Pharmacy Consult for Coumadin Indication: atrial fibrillation  Allergies  Allergen Reactions  . Alprazolam     REACTION: stops breathing  . Iodine Swelling    REACTION: swelling in throat  . Pseudoephedrine Hcl Er Shortness Of Breath  . Budesonide-Formoterol Fumarate     bliusters  . Crestor [Rosuvastatin Calcium] Other (See Comments)    Unable to walk  . Esomeprazole Magnesium Other (See Comments)    REACTION: "bouncing off walls"  . Flonase [Fluticasone Propionate] Other (See Comments)    unspecified  . Loratadine Other (See Comments)    claritin D causes shaking  . Lotrimin [Clotrimazole] Other (See Comments)    blisters  . Lunesta [Eszopiclone] Other (See Comments)    REACTION: "slept for a week"  . Oxcarbazepine Other (See Comments)    Causes deep sleep and dizziness  . Statins Other (See Comments)    unspecified  . Zolpidem Tartrate Other (See Comments)    REACTION: "slept for a week"  . Aciphex [Rabeprazole Sodium] Rash  . Avelox [Moxifloxacin Hcl In Nacl] Other (See Comments)    Stomach cramps.   Darlin Coco [Valdecoxib] Rash  . Ceclor [Cefaclor] Rash  . Cephalexin Rash    Pt states that she is possibly allergic to this - had a reaction to Cefaclor in the past and she does not want to these class drugs. Added per patient request.  . Covera-Hs [Verapamil Hcl] Palpitations  . Dicyclomine Hcl Rash  . Other Other (See Comments)    Glue from ekg/heart monitor leads --rash +  Any MYCINS  . Tessalon Perles Rash    Patient Measurements:    Vital Signs: Temp: 99 F (37.2 C) (04/09 0948) Temp Source: Oral (04/09 0948) BP: 144/101 (04/09 1500) Pulse Rate: 108 (04/09 1500)  Labs:  Recent Labs  02/08/17 1233  HGB 10.3*  HCT 33.4*  PLT 239  LABPROT 24.8*  INR 2.20  CREATININE 0.93    Estimated Creatinine Clearance: 59.9 mL/min (by C-G formula based on SCr of 0.93 mg/dL).   Medical History: Past Medical  History:  Diagnosis Date  . Complication of anesthesia    various issues with oxygen  saturations post op  . COPD (chronic obstructive pulmonary disease) ([place])   . Depression   . Diverticulitis   . DVT (deep venous thrombosis) ([place])   . Fibromyalgia   . GERD (gastroesophageal reflux disease)   . Hiatal hernia   . History of deviated nasal septum    left- side  . HTN (hypertension)   . Hyperlipidemia   . Obesity   . On supplemental oxygen therapy    concentrator at night @ 1.5 l/m or when sleeps. O2 Sat niormally 87.  . Osteoporosis   . PAT (paroxysmal atrial tachycardia) ([place])   . PFO (patent foramen ovale)   . PULMONARY NODULE, LEFT LOWER LOBE 10/14/2009   61mm LLL nodule dec 2010. Stable and 46mm in Oct 2012. No further fu  . Right middle lobe pneumonia ([place]) 07/24/2011   First noted at admit 07/10/11. Persists on cxr 07/22/11. Cleared on CT 08/24/11. No further followup  . Stroke ([place])   . TOBACCO ABUSE 06/04/2009   Assessment: 71yof on coumadin for afib. INR therapeutic on admission at 2.2.   Home dose: 2.5mg  daily except 5mg  on Monday/Friday - last taken 4/8  Goal of Therapy:  INR 2-3 Monitor platelets by anticoagulation protocol: Yes   Plan:  1) Coumadin 5mg  today 2) Daily INR  Deboraha Sprang 02/08/2017,3:35 PM

## 2017-02-08 NOTE — Progress Notes (Signed)
[name] 71 y.o.   Chief Complaint  Patient presents with  . Shortness of Breath    HISTORY OF PRESENT ILLNESS: This is a 71 y.o. female complaining of increased SOB over the past several days with intermittent chest pain and palpitations.  Shortness of Breath  This is a chronic problem. The current episode started in the past 7 days. The problem occurs constantly. The problem has been gradually worsening. Associated symptoms include chest pain. Pertinent negatives include no abdominal pain, claudication, fever, headaches, hemoptysis, leg pain, leg swelling, neck pain, rash, sore throat, syncope, vomiting or wheezing. Treatments tried: increased oxygen use, now 24/7. The treatment provided no relief.     Prior to Admission medications   Medication Sig Start Date End Date Taking? Authorizing Provider  clarithromycin (BIAXIN) 500 MG tablet Take 1 tablet (500 mg total) by mouth 2 (two) times daily. 01/20/17   Brand Males, MD  desvenlafaxine (PRISTIQ) 50 MG 24 hr tablet Take 1 tablet (50 mg total) by mouth daily. 01/13/17   Kathlee Nations, MD  diazepam (VALIUM) 2 MG tablet Take 2 mg by mouth every 6 (six) hours as needed for anxiety.    Historical Provider, MD  gabapentin (NEURONTIN) 100 MG capsule Take 100 mg by mouth 4 (four) times daily.  09/16/16   Historical Provider, MD  HYDROcodone-acetaminophen (NORCO/VICODIN) 5-325 MG tablet Take 1 tablet by mouth 2 (two) times daily.     Historical Provider, MD  ipratropium (ATROVENT) 0.02 % nebulizer solution Inhale 1 mL into the lungs every 4 (four) hours as needed.  08/16/16   Historical Provider, MD  ipratropium (ATROVENT) 0.02 % nebulizer solution USE 1 VIAL BY NEBULIZATION 4 (FOUR) TIMES DAILY. 02/04/17   Brand Males, MD  metoprolol succinate (TOPROL-XL) 25 MG 24 hr tablet Take 1/2 tablet by mouth daily 01/20/17   Peter M Martinique, MD  montelukast (SINGULAIR) 10 MG tablet TAKE 1 TABLET BY MOUTH AT BEDTIME 10/20/16   [name], MD   ondansetron (ZOFRAN ODT) 8 MG disintegrating tablet Take 1 tablet (8 mg total) by mouth every 8 (eight) hours as needed for nausea or vomiting. 09/01/16   Tatyana Kirichenko, PA-C  oxybutynin (DITROPAN-XL) 10 MG 24 hr tablet Take 10 mg by mouth at bedtime.  08/11/16   Historical Provider, MD  OXYGEN Inhale 1.5 L into the lungs. For naps and sleep.    Historical Provider, MD  pantoprazole (PROTONIX) 40 MG tablet Take 1 tablet by mouth every morning.  07/24/13   Historical Provider, MD  polyethylene glycol (MIRALAX / GLYCOLAX) packet Take 17 g by mouth daily as needed for mild constipation.     Historical Provider, MD  predniSONE (DELTASONE) 10 MG tablet Take 4 tabs po x 1 day, then 3 x 1 day, then 2 x 1 day, then 1 x 1 day, then 1/2 x 1 day then stop. 01/20/17   Brand Males, MD  rosuvastatin (CRESTOR) 5 MG tablet Take 5 mg by mouth once a week. Sunday 07/30/16   Historical Provider, MD  tiZANidine (ZANAFLEX) 2 MG tablet Take by mouth every 6 (six) hours as needed for muscle spasms.    Historical Provider, MD  traZODone (DESYREL) 100 MG tablet Take 1 tablet (100 mg total) by mouth at bedtime. 01/13/17   Kathlee Nations, MD  warfarin (COUMADIN) 5 MG tablet TAKE 1/2 TO 1 TABLET BY MOUTH EVERY DAY OR AS DIRECTED BY COUMADIN CLINIC (FILLABLE 09/02/16) 12/15/16   Peter M Martinique, MD  Allergies  Allergen Reactions  . Alprazolam     REACTION: stops breathing  . Pseudoephedrine Hcl Er Shortness Of Breath  . Budesonide-Formoterol Fumarate     bliusters  . Aciphex [Rabeprazole Sodium]     rash  . Avelox [Moxifloxacin Hcl In Nacl]     Stomach cramps.   . Crestor [Rosuvastatin Calcium] Other (See Comments)    Unable to walk  . Esomeprazole Magnesium     REACTION: "bouncing off walls"  . Flonase [Fluticasone Propionate]   . Iodine     REACTION: swelling in throat  . Loratadine     claritin D causes shaking  . Lotrimin [Clotrimazole] Other (See Comments)    blisters  . Lunesta [Eszopiclone]      REACTION: "slept for a week"  . Other     Glue from ekg/heart monitor leads --rash +  Any MYCINS  . Oxcarbazepine Other (See Comments)    Causes deep sleep and dizziness  . Statins Other (See Comments)  . Zolpidem Tartrate     REACTION: "slept for a week"  . Bextra [Valdecoxib] Rash  . Ceclor [Cefaclor] Rash  . Cephalexin Rash    Pt states that she is possibly allergic to this - had a reaction to Cefaclor in the past and she does not want to these class drugs. Added per patient request.  . Covera-Hs [Verapamil Hcl] Palpitations  . Dicyclomine Hcl Rash  . Tessalon Perles Rash    Patient Active Problem List   Diagnosis Date Noted  . Moderate episode of recurrent major depressive disorder ([place]) 01/04/2017  . Generalized anxiety disorder 01/04/2017  . Costochondritis 12/28/2016  . Pure hypercholesterolemia 12/28/2016  . 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 Acute blood loss anemia 08/03/2016  . Memory loss 08/14/2015  . MCI (mild cognitive impairment) 08/14/2015  . Carotid stenosis 04/02/2015  . Long-term (current) use of anticoagulants 09/26/2014  . PAF (paroxysmal atrial fibrillation) ([place]) 09/19/2014  . 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 Chronic respiratory failure with hypoxia ([place]) 06/27/2014  . 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 Idiopathic scoliosis and kyphoscoliosis 07/19/2013  . Hyperglycemia, drug-induced 10/12/2011  . Encounter for long-term (current) use of medications 08/21/2011  . PAT (paroxysmal atrial tachycardia) ([place]) 08/21/2011  . PULMONARY NODULE, LEFT LOWER LOBE 10/14/2009  . PATENT FORAMEN OVALE 10/14/2009  . DEPRESSION 06/04/2009  . Essential hypertension 06/04/2009  . Other emphysema ([place]) 06/04/2009  . G E R D 06/04/2009  . SNORING, HX OF 06/04/2009    Past Medical History:  Diagnosis Date  . Complication of anesthesia    various issues with oxygen  saturations post op   . COPD (chronic obstructive pulmonary disease) ([place])   . Depression   . Diverticulitis   . DVT (deep venous thrombosis) ([place])   . Fibromyalgia   . GERD (gastroesophageal reflux disease)   . Hiatal hernia   . History of deviated nasal septum    left- side  . HTN (hypertension)   . Hyperlipidemia   . Obesity   . On supplemental oxygen therapy    concentrator at night @ 1.5 l/m or when sleeps. O2 Sat niormally 87.  . Osteoporosis   . PAT (paroxysmal atrial tachycardia) ([place])   . PFO (patent foramen ovale)   . PULMONARY NODULE, LEFT LOWER LOBE 10/14/2009   13mm LLL nodule dec 2010. Stable and 24mm in Oct 2012. No further fu  . Right middle lobe pneumonia ([place])  07/24/2011   First noted at admit 07/10/11. Persists on cxr 07/22/11. Cleared on CT 08/24/11. No further followup  . Stroke ([place])   . TOBACCO ABUSE 06/04/2009    Past Surgical History:  Procedure Laterality Date  . CARDIOVASCULAR STRESS TEST  12/26/2004   EF 74%. NO EVIDENCE OF ISCHEMIA  . ESOPHAGOGASTRODUODENOSCOPY (EGD) WITH PROPOFOL N/A 04/15/2015   Procedure: ESOPHAGOGASTRODUODENOSCOPY (EGD) WITH PROPOFOL;  Surgeon: Laurence Spates, MD;  Location: [facility];  Service: Endoscopy;  Laterality: N/A;  . KNEE ARTHROSCOPY  2000   left  . LAPAROSCOPIC CHOLECYSTECTOMY  04-16-2010   [facility]  . MOUTH SURGERY     03-26-15 multiple extractions stitches remains  . TOTAL ABDOMINAL HYSTERECTOMY     post op needed oxygen was told "she gave them a scare"  . TUBAL LIGATION    . US ECHOCARDIOGRAPHY  11/20/2009   EF 55-60%    Social History   Social History  . Marital status: Divorced    Spouse name: N/A  . Number of children: 2  . Years of education: N/A   Occupational History  . disability    Social History Main Topics  . Smoking status: Former Smoker    Quit date: 06/02/2010  . Smokeless tobacco: Never Used  . Alcohol use No  . Drug use: No  . Sexual activity: Yes   Other Topics Concern  . Not on file   Social History  Narrative  . No narrative on file    Family History  Problem Relation Age of Onset  . Dementia Mother   . Diabetes Mother   . Alzheimer's disease Mother   . Heart attack Brother 9  . Heart attack Father   . Schizophrenia Sister   . Diabetes Sister   . Tremor Sister      Review of Systems  Constitutional: Positive for malaise/fatigue. Negative for fever.  HENT: Negative for congestion, nosebleeds, sinus pain and sore throat.   Eyes: Negative for blurred vision, double vision, discharge and redness.  Respiratory: Positive for cough and shortness of breath. Negative for hemoptysis and wheezing.   Cardiovascular: Positive for chest pain and palpitations. Negative for claudication, leg swelling and syncope.  Gastrointestinal: Negative for abdominal pain, diarrhea, nausea and vomiting.  Genitourinary: Negative for dysuria and hematuria.  Musculoskeletal: Positive for back pain. Negative for myalgias and neck pain.  Skin: Negative for rash.  Neurological: Positive for dizziness and weakness. Negative for sensory change, focal weakness and headaches.  Endo/Heme/Allergies: Bruises/bleeds easily.  All other systems reviewed and are negative.  Vitals:   02/08/17 0948  BP: 122/84  Pulse: 92  Resp: 17  Temp: 99 F (37.2 C)   EKG: rapid Afib with ischemic inferiolateral changes. CXR interpreted by me: No CHF/pulmonary edema. COPD changes.  Physical Exam  Constitutional: She is oriented to person, place, and time. She appears well-developed and well-nourished. She appears ill.  HENT:  Head: Normocephalic and atraumatic.  Eyes: Conjunctivae and EOM are normal. Pupils are equal, round, and reactive to light.  Neck: Normal range of motion. Neck supple. JVD present.  Cardiovascular: An irregularly irregular rhythm present. Tachycardia present.   Pulmonary/Chest: She has decreased breath sounds. She has no wheezes. She has no rales.  Tachypneic when talking; speaks in short sentences.   Abdominal: Soft. There is no tenderness.  Musculoskeletal: Normal range of motion.  Lymphadenopathy:    She has no cervical adenopathy.  Neurological: She is alert and oriented to person, place, and time. No sensory deficit. She  exhibits normal muscle tone.  Skin: Skin is warm and dry. Capillary refill takes less than 2 seconds.  Psychiatric: She has a normal mood and affect. Her behavior is normal.  Vitals reviewed.    ASSESSMENT & PLAN:  Advised to go to ER by ambulance but patient refuses. Will have friend drive her instead.  Debborah was seen today for shortness of breath.  Diagnoses and all orders for this visit:  Shortness of breath -     EKG 12-Lead -     DG Chest 2 View; Future  PAF (paroxysmal atrial fibrillation) ([facility])  Rapid atrial fibrillation ([place])  COPD exacerbation ([facility])     Agustina Caroli, MD [facility]

## 2017-02-08 NOTE — ED Notes (Signed)
Pt is in gown and on monitor

## 2017-02-08 NOTE — H&P (Signed)
History and Physical    TELETHA PETREA ZES:[id number] DOB: September 06, 1946 DOA: 02/08/2017   PCP: [name], MD Althia Forts  Patient coming from/Resides with: Private residence/alone  Admission status: Inpatient/telemetry -medically necessary to stay a minimum 2 midnights to rule out impending and/or unexpected changes in physiologic status that may differ from initial evaluation performed in the ER and/or at time of admission. Presents with acute respiratory failure with increased O2 need from baseline chronic O2 requirement with clinical findings concerning for acute diastolic heart failure exacerbation. Patient will require continuous telemetry monitoring, daily weights and strict intake and output, inpatient echocardiogram, IV Lasix for at least 2 more doses. She is also having palpitations and is having atrial fibrillation with mild RVR and may require inpatient cardiology consultation.  Chief Complaint: Dyspnea on exertion  HPI: HELAINA STEFANO is a 71 y.o. female with medical history significant for severe COPD on chronic oxygen with activity and while sleeping since 2006, atrial fibrillation on beta blocker, known PFO, fibromyalgia and chronic chest pain, generalized anxiety disorder, cholesterolemia, hypertension and known grade 1 diastolic dysfunction based on echocardiogram 2015. Patient reports over the past week she has noticed increased palpitations, nonproductive cough and increased dyspnea on exertion. She's also noticed new orthopnea. She is not had any lower extremity edema and does not have a scale so was unable to weigh herself at home. She initially presented to the [facility] earlier today and due to concerns of possible cardiac ischemia given her report of chest pain she was sent to the ER for further evaluation.  ED Course:  Vital Signs: BP (!) 144/101   Pulse (!) 110   Resp 20   SpO2 96%  PCXR: No edema or consolidation Lab data: Na 138, K 3.8, Cl 103, CO2 27,  Gluc 137, BUN 10, Cr 0.93, BNP 685, poc TNI 0.01, Lactic acid 1.63, WBC 6300, Hgb 10.3, platelets 239,000, PT 24.8, INR 2 2 Medications and treatments: Vicodin 03-3250 tab, Neurontin 100 mg 1, Lasix 40 mg IV 1  Review of Systems:  In addition to the HPI above,  No Fever-chills, myalgias or other constitutional symptoms No Headache, changes with Vision or hearing, new weakness, tingling, numbness in any extremity, dizziness, dysarthria or word finding difficulty, gait disturbance or imbalance, tremors or seizure activity No problems swallowing food or Liquids, indigestion/reflux, choking or coughing while eating, abdominal pain with or after eating No Abdominal pain, N/V, melena,hematochezia, dark tarry stools, constipation No dysuria, malodorous urine, hematuria or flank pain No new skin rashes, lesions, masses or bruises, No new joint pains, aches, swelling or redness No recent unintentional weight gain or loss No polyuria, polydypsia or polyphagia   Past Medical History:  Diagnosis Date  . Complication of anesthesia    various issues with oxygen  saturations post op  . COPD (chronic obstructive pulmonary disease) ([place])   . Depression   . Diverticulitis   . DVT (deep venous thrombosis) ([place])   . Fibromyalgia   . GERD (gastroesophageal reflux disease)   . Hiatal hernia   . History of deviated nasal septum    left- side  . HTN (hypertension)   . Hyperlipidemia   . Obesity   . On supplemental oxygen therapy    concentrator at night @ 1.5 l/m or when sleeps. O2 Sat niormally 87.  . Osteoporosis   . PAT (paroxysmal atrial tachycardia) ([place])   . PFO (patent foramen ovale)   . PULMONARY NODULE, LEFT LOWER LOBE 10/14/2009   34mm LLL  nodule dec 2010. Stable and 34mm in Oct 2012. No further fu  . Right middle lobe pneumonia ([place]) 07/24/2011   First noted at admit 07/10/11. Persists on cxr 07/22/11. Cleared on CT 08/24/11. No further followup  . Stroke ([place])   . TOBACCO ABUSE 06/04/2009     Past Surgical History:  Procedure Laterality Date  . CARDIOVASCULAR STRESS TEST  12/26/2004   EF 74%. NO EVIDENCE OF ISCHEMIA  . ESOPHAGOGASTRODUODENOSCOPY (EGD) WITH PROPOFOL N/A 04/15/2015   Procedure: ESOPHAGOGASTRODUODENOSCOPY (EGD) WITH PROPOFOL;  Surgeon: Laurence Spates, MD;  Location: [facility];  Service: Endoscopy;  Laterality: N/A;  . KNEE ARTHROSCOPY  2000   left  . LAPAROSCOPIC CHOLECYSTECTOMY  04-16-2010   [place]  . MOUTH SURGERY     03-26-15 multiple extractions stitches remains  . TOTAL ABDOMINAL HYSTERECTOMY     post op needed oxygen was told "she gave them a scare"  . TUBAL LIGATION    . US ECHOCARDIOGRAPHY  11/20/2009   EF 55-60%    Social History   Social History  . Marital status: Divorced    Spouse name: N/A  . Number of children: 2  . Years of education: N/A   Occupational History  . disability    Social History Main Topics  . Smoking status: Former Smoker    Quit date: 06/02/2010  . Smokeless tobacco: Never Used  . Alcohol use No  . Drug use: No  . Sexual activity: Yes   Other Topics Concern  . Not on file   Social History Narrative  . No narrative on file    Mobility: Mobilizes independently without assistive devices Work history: Not obtained   Allergies  Allergen Reactions  . Alprazolam     REACTION: stops breathing  . Iodine Swelling    REACTION: swelling in throat  . Pseudoephedrine Hcl Er Shortness Of Breath  . Budesonide-Formoterol Fumarate     bliusters  . Crestor [Rosuvastatin Calcium] Other (See Comments)    Unable to walk  . Esomeprazole Magnesium Other (See Comments)    REACTION: "bouncing off walls"  . Flonase [Fluticasone Propionate] Other (See Comments)    unspecified  . Loratadine Other (See Comments)    claritin D causes shaking  . Lotrimin [Clotrimazole] Other (See Comments)    blisters  . Lunesta [Eszopiclone] Other (See Comments)    REACTION: "slept for a week"  . Oxcarbazepine Other (See Comments)     Causes deep sleep and dizziness  . Statins Other (See Comments)    unspecified  . Zolpidem Tartrate Other (See Comments)    REACTION: "slept for a week"  . Aciphex [Rabeprazole Sodium] Rash  . Avelox [Moxifloxacin Hcl In Nacl] Other (See Comments)    Stomach cramps.   Darlin Coco [Valdecoxib] Rash  . Ceclor [Cefaclor] Rash  . Cephalexin Rash    Pt states that she is possibly allergic to this - had a reaction to Cefaclor in the past and she does not want to these class drugs. Added per patient request.  . Covera-Hs [Verapamil Hcl] Palpitations  . Dicyclomine Hcl Rash  . Other Other (See Comments)    Glue from ekg/heart monitor leads --rash +  Any MYCINS  . Tessalon Perles Rash    Family History  Problem Relation Age of Onset  . Dementia Mother   . Diabetes Mother   . Alzheimer's disease Mother   . Heart attack Brother 67  . Heart attack Father   . Schizophrenia Sister   . Diabetes Sister   .  Tremor Sister      Prior to Admission medications   Medication Sig Start Date End Date Taking? Authorizing Provider  desvenlafaxine (PRISTIQ) 50 MG 24 hr tablet Take 1 tablet (50 mg total) by mouth daily. 01/13/17  Yes Kathlee Nations, MD  diazepam (VALIUM) 2 MG tablet Take 2 mg by mouth every 6 (six) hours as needed for anxiety.   Yes Historical Provider, MD  gabapentin (NEURONTIN) 100 MG capsule Take 100 mg by mouth 4 (four) times daily.  09/16/16  Yes Historical Provider, MD  HYDROcodone-acetaminophen (NORCO/VICODIN) 5-325 MG tablet Take 1 tablet by mouth 2 (two) times daily.    Yes Historical Provider, MD  ipratropium (ATROVENT) 0.02 % nebulizer solution USE 1 VIAL BY NEBULIZATION 4 (FOUR) TIMES DAILY. Patient taking differently: USE 1 VIAL BY NEBULIZATION VERY EIGHT HOURS 02/04/17  Yes Brand Males, MD  metoprolol succinate (TOPROL-XL) 25 MG 24 hr tablet Take 1/2 tablet by mouth daily Patient taking differently: Take 12.5 mg by mouth daily. Take 1/2 tablet by mouth daily  01/20/17  Yes  Peter M Martinique, MD  montelukast (SINGULAIR) 10 MG tablet TAKE 1 TABLET BY MOUTH AT BEDTIME 10/20/16  Yes [name], MD  oxybutynin (DITROPAN-XL) 10 MG 24 hr tablet Take 10 mg by mouth at bedtime.  08/11/16  Yes Historical Provider, MD  OXYGEN Inhale 1.5 L into the lungs. For naps and sleep.   Yes Historical Provider, MD  pantoprazole (PROTONIX) 40 MG tablet Take 1 tablet by mouth every morning.  07/24/13  Yes Historical Provider, MD  polyethylene glycol (MIRALAX / GLYCOLAX) packet Take 17 g by mouth daily as needed for mild constipation.    Yes Historical Provider, MD  rosuvastatin (CRESTOR) 5 MG tablet Take 5 mg by mouth once a week. Sunday 07/30/16  Yes Historical Provider, MD  tiZANidine (ZANAFLEX) 2 MG tablet Take by mouth every 6 (six) hours as needed for muscle spasms.   Yes Historical Provider, MD  traZODone (DESYREL) 100 MG tablet Take 1 tablet (100 mg total) by mouth at bedtime. 01/13/17  Yes Kathlee Nations, MD  triamcinolone ointment (KENALOG) 0.1 % Apply 1 application topically daily as needed for skin breakdown. 02/03/17  Yes Historical Provider, MD  warfarin (COUMADIN) 5 MG tablet TAKE 1/2 TO 1 TABLET BY MOUTH EVERY DAY OR AS DIRECTED BY COUMADIN CLINIC (FILLABLE 09/02/16) Patient taking differently: 5mg  on Mondays, Fridays - 2.5mg  all other days 12/15/16  Yes Peter M Martinique, MD  clarithromycin (BIAXIN) 500 MG tablet Take 1 tablet (500 mg total) by mouth 2 (two) times daily. Patient not taking: Reported on 02/08/2017 01/20/17   Brand Males, MD  ondansetron (ZOFRAN ODT) 8 MG disintegrating tablet Take 1 tablet (8 mg total) by mouth every 8 (eight) hours as needed for nausea or vomiting. Patient not taking: Reported on 02/08/2017 09/01/16   Tatyana Kirichenko, PA-C  predniSONE (DELTASONE) 10 MG tablet Take 4 tabs po x 1 day, then 3 x 1 day, then 2 x 1 day, then 1 x 1 day, then 1/2 x 1 day then stop. Patient not taking: Reported on 02/08/2017 01/20/17   Brand Males, MD    Physical  Exam: Vitals:   02/08/17 1300 02/08/17 1400 02/08/17 1500 02/08/17 1530  BP: (!) 143/90 135/78 (!) 144/101   Pulse: 96 (!) 110 (!) 108 (!) 110  Resp: (!) 27 (!) 27 (!) 26 20  SpO2: 91% 96% 91% 96%      Constitutional: NAD, Somewhat anxious, comfortable when seated  upright on side of stretcher Eyes: PERRL, lids and conjunctivae normal ENMT: Mucous membranes are moist. Posterior pharynx clear of any exudate or lesions.Normal dentition.  Neck: normal, supple, no masses, no thyromegaly Respiratory: Bilateral crackles mid fields down. Mildly tachypneic and become short of breath with attempts to communicate verbally, noted use of accessory muscles with breathing especially when talking Cardiovascular: Irregular rate underlying atrial fibrillation with ventricular rates in the low 100s to the 110s, no murmurs / rubs / gallops. No extremity edema. 2+ pedal pulses. No carotid bruits.  Abdomen: no tenderness, no masses palpated. No hepatosplenomegaly. Bowel sounds positive.  Musculoskeletal: no clubbing / cyanosis. No joint deformity upper and lower extremities. Good ROM, no contractures. Normal muscle tone.  Skin: no rashes, lesions, ulcers. No induration Neurologic: CN 2-12 grossly intact. Sensation intact, DTR normal. Strength 5/5 x all 4 extremities.  Psychiatric: Normal judgment and insight. Alert and oriented x 3. Anxious mood.    Labs on Admission: I have personally reviewed following labs and imaging studies  CBC:  Recent Labs Lab 02/08/17 1233  WBC 6.3  HGB 10.3*  HCT 33.4*  MCV 87.7  PLT 213   Basic Metabolic Panel:  Recent Labs Lab 02/08/17 1233  NA 138  K 3.8  CL 103  CO2 27  GLUCOSE 137*  BUN 10  CREATININE 0.93  CALCIUM 9.2   GFR: Estimated Creatinine Clearance: 59.9 mL/min (by C-G formula based on SCr of 0.93 mg/dL). Liver Function Tests: No results for input(s): AST, ALT, ALKPHOS, BILITOT, PROT, ALBUMIN in the last 168 hours. No results for input(s):  LIPASE, AMYLASE in the last 168 hours. No results for input(s): AMMONIA in the last 168 hours. Coagulation Profile:  Recent Labs Lab 02/08/17 1233  INR 2.20   Cardiac Enzymes: No results for input(s): CKTOTAL, CKMB, CKMBINDEX, TROPONINI in the last 168 hours. BNP (last 3 results) No results for input(s): PROBNP in the last 8760 hours. HbA1C: No results for input(s): HGBA1C in the last 72 hours. CBG: No results for input(s): GLUCAP in the last 168 hours. Lipid Profile: No results for input(s): CHOL, HDL, LDLCALC, TRIG, CHOLHDL, LDLDIRECT in the last 72 hours. Thyroid Function Tests: No results for input(s): TSH, T4TOTAL, FREET4, T3FREE, THYROIDAB in the last 72 hours. Anemia Panel: No results for input(s): VITAMINB12, FOLATE, FERRITIN, TIBC, IRON, RETICCTPCT in the last 72 hours. Urine analysis:    Component Value Date/Time   COLORURINE YELLOW 09/06/2016 1401   APPEARANCEUR CLEAR 09/06/2016 1401   LABSPEC 1.011 09/06/2016 1401   PHURINE 8.5 (H) 09/06/2016 1401   GLUCOSEU NEGATIVE 09/06/2016 1401   HGBUR NEGATIVE 09/06/2016 1401   BILIRUBINUR NEGATIVE 09/06/2016 1401   BILIRUBINUR negative 07/31/2016 1400   KETONESUR NEGATIVE 09/06/2016 1401   PROTEINUR NEGATIVE 09/06/2016 1401   UROBILINOGEN 1.0 07/31/2016 1400   UROBILINOGEN 0.2 05/04/2013 1132   NITRITE NEGATIVE 09/06/2016 1401   LEUKOCYTESUR TRACE (A) 09/06/2016 1401   Sepsis Labs: @LABRCNTIP (procalcitonin:4,lacticidven:4) )No results found for this or any previous visit (from the past 240 hour(s)).   Radiological Exams on Admission: Dg Chest 2 View  Result Date: 02/08/2017 CLINICAL DATA:  Shortness of breath, history of COPD, hypertension, obesity, atrial fibrillation, former smoker. EXAM: CHEST  2 VIEW COMPARISON:  PA and lateral chest x-ray of September 06, 2016 FINDINGS: The lungs are mildly hyperinflated. The interstitial markings are coarse. The cardiac silhouette is enlarged. The pulmonary vascularity is not  engorged. The mediastinum is normal in width. There is calcification in the wall of the  aortic arch. The trachea is midline. There is no pleural effusion. The bony thorax exhibits no acute abnormality. IMPRESSION: Chronic bronchitic changes, stable. Stable cardiomegaly without pulmonary vascular congestion. Thoracic aortic atherosclerosis. Electronically Signed   By: David  Martinique M.D.   On: 02/08/2017 10:34   Dg Chest Portable 1 View  Result Date: 02/08/2017 CLINICAL DATA:  Shortness of breath and chest pain EXAM: PORTABLE CHEST 1 VIEW COMPARISON:  February 08, 2017 study obtained earlier in the day FINDINGS: Cardiomegaly persists. The pulmonary vascularity is within normal limits. There is no frank edema or consolidation. There is aortic atherosclerosis. No adenopathy. No bone lesions. IMPRESSION: Stable cardiomegaly. No edema or consolidation. There is aortic atherosclerosis. Electronically Signed   By: Lowella Grip III M.D.   On: 02/08/2017 12:55    EKG: (Independently reviewed) H her fibrillation with ventricular rate 108 bpm, QTC 519 ms and setting up a regular tachycardic active fibrillation rhythm, no acute ischemic changes  Assessment/Plan Principal Problem:   Acute on chronic respiratory failure with hypoxia  -Patient on chronic 2 L of oxygen to be used with exertion or at bedtime who now presents with increased oxygen requirement and associated dyspnea on exertion, nonproductive cough and elevated BNP concerning for acute diastolic heart failure exacerbation-possible concurrent COPD exacerbation although no active wheezing -Treat underlying causes (see below) -Continue supportive care with oxygen  Active Problems:   COPD, severe  -Chronic oxygen as described above -No active wheezing and moving air quite well bilaterally and therefore no indication to initiate steroids at this juncture -Continue home nebs    Acute diastolic heart failure, NYHA class 1  -Echocardiogram 2015: EF  01-77%, grade 1 diastolic dysfunction, mild concentric hypertrophy, RV cavity size upper limits of normal, mild pulmonic regurgitation -BNP elevated greater than 680 -Increased urine output with Lasix given in ER-continue Lasix 40 mg IV q 12 hrs x 2 more doses -Unclear at this juncture if diastolic dysfunction has progressed or if patient has developed RHF/cor pulmonale -Patient reporting increasing palpitations and now in atrial fibrillation with ventricular rates greater than 100 but less than 120-RVR could be contributing to heart failure as well-(see below)    Essential hypertension -Current blood pressure uncontrolled- patient reports did not take home medications prior to arrival -Continue Toprol    PATENT FORAMEN OVALE -Followed by Dr. Martinique in the outpatient setting -Initially diagnosed on TEE in 2006 after stroke    Atrial fibrillation with RVR  -Current ventricular rates not well controlled but uncertain if precipitated by acute respiratory symptoms or if reflective of the etiology to current respiratory symptoms -Continue beta blocker-may need to increase dosage if RVR persists and if blood pressure will tolerate -Continue Coumadin/pharmacy to manage -CHADVASc = 6    Pure hypercholesterolemia -Continue Crestor    Generalized anxiety disorder/Fibromyalgia and chronic chest pain -Continue Pristiq, Valium prn, Neurontin, Zanaflex, and trazodone     DVT prophylaxis: Warfarin Code Status: DO NOT RESUSCITATE  Family Communication: Daughter Disposition Plan: Home Consults called: None     ELLIS,ALLISON L. ANP-BC [facility] Pager [phone]   If 7PM-7AM, please contact night-coverage [email or web address] Password [facility]  02/08/2017, 4:17 PM

## 2017-02-08 NOTE — Progress Notes (Signed)
Pt refused bed alarm.  Instructed to call for asistance Verbalized understanding.  Danny Lawless, RN

## 2017-02-08 NOTE — ED Provider Notes (Signed)
[facility] DEPT Provider Note   CSN: [id number] Arrival date & time: 02/08/17  1212     History   Chief Complaint Chief Complaint  Patient presents with  . Chest Pain  . Shortness of Breath    HPI JEANA KERSTING is a 71 y.o. female.  Patient w hx copd, fibromyalgia, afib, c/o sob for the past month, worse in past week (dyspnea w taking 10 steps) and states in past few days has pain all over, including chest. Symptoms moderate, persistent. +non prod cough. No sore throat. No exertional cp or discomfort. No leg pain or swelling. No fever or chills. States compliant w meds but hasnt had her hydrocodone or neurontin today.    The history is provided by the patient.  Chest Pain   Associated symptoms include cough and shortness of breath. Pertinent negatives include no abdominal pain, no back pain, no fever and no headaches.  Shortness of Breath  Associated symptoms include cough and chest pain. Pertinent negatives include no fever, no headaches, no sore throat, no neck pain, no abdominal pain, no rash and no leg swelling.    Past Medical History:  Diagnosis Date  . Complication of anesthesia    various issues with oxygen  saturations post op  . COPD (chronic obstructive pulmonary disease) ([place])   . Depression   . Diverticulitis   . DVT (deep venous thrombosis) ([place])   . Fibromyalgia   . GERD (gastroesophageal reflux disease)   . Hiatal hernia   . History of deviated nasal septum    left- side  . HTN (hypertension)   . Hyperlipidemia   . Obesity   . On supplemental oxygen therapy    concentrator at night @ 1.5 l/m or when sleeps. O2 Sat niormally 87.  . Osteoporosis   . PAT (paroxysmal atrial tachycardia) ([employer])   . PFO (patent foramen ovale)   . PULMONARY NODULE, LEFT LOWER LOBE 10/14/2009   35mm LLL nodule dec 2010. Stable and 46mm in Oct 2012. No further fu  . Right middle lobe pneumonia ([place]) 07/24/2011   First noted at admit 07/10/11. Persists on cxr 07/22/11.  Cleared on CT 08/24/11. No further followup  . Stroke ([place])   . TOBACCO ABUSE 06/04/2009    Patient Active Problem List   Diagnosis Date Noted  . Moderate episode of recurrent major depressive disorder ([place]) 01/04/2017  . Generalized anxiety disorder 01/04/2017  . Costochondritis 12/28/2016  . Pure hypercholesterolemia 12/28/2016  . 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 Acute blood loss anemia 08/03/2016  . Memory loss 08/14/2015  . MCI (mild cognitive impairment) 08/14/2015  . Carotid stenosis 04/02/2015  . Long-term (current) use of anticoagulants 09/26/2014  . PAF (paroxysmal atrial fibrillation) ([place]) 09/19/2014  . 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 Chronic respiratory failure with hypoxia ([place]) 06/27/2014  . 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 Idiopathic scoliosis and kyphoscoliosis 07/19/2013  . Hyperglycemia, drug-induced 10/12/2011  . Encounter for long-term (current) use of medications 08/21/2011  . PAT (paroxysmal atrial tachycardia) ([place]) 08/21/2011  . PULMONARY NODULE, LEFT LOWER LOBE 10/14/2009  . PATENT FORAMEN OVALE 10/14/2009  . DEPRESSION 06/04/2009  . Essential hypertension 06/04/2009  . Other emphysema ([place]) 06/04/2009  . G E R D 06/04/2009  . SNORING, HX OF 06/04/2009    Past Surgical History:  Procedure Laterality Date  . CARDIOVASCULAR STRESS TEST  12/26/2004   EF 74%. NO EVIDENCE OF ISCHEMIA  . ESOPHAGOGASTRODUODENOSCOPY (  EGD) WITH PROPOFOL N/A 04/15/2015   Procedure: ESOPHAGOGASTRODUODENOSCOPY (EGD) WITH PROPOFOL;  Surgeon: Laurence Spates, MD;  Location: [facility] END[facility]COPY;  Service: Endoscopy;  Laterality: N/A;  . KNEE ARTHROSCOPY  2000   left  . LAPAROSCOPIC CHOLECYSTECTOMY  04-16-2010   [facility]  . MOUTH SURGERY     03-26-15 multiple extractions stitches remains  . TOTAL ABDOMINAL HYSTERECTOMY     post op needed oxygen was told "she gave them a scare"  . TUBAL LIGATION    . US  ECHOCARDIOGRAPHY  11/20/2009   EF 55-60%    OB History    No data available       Home Medications    Prior to Admission medications   Medication Sig Start Date End Date Taking? Authorizing Provider  clarithromycin (BIAXIN) 500 MG tablet Take 1 tablet (500 mg total) by mouth 2 (two) times daily. 01/20/17   Brand Males, MD  desvenlafaxine (PRISTIQ) 50 MG 24 hr tablet Take 1 tablet (50 mg total) by mouth daily. 01/13/17   Kathlee Nations, MD  diazepam (VALIUM) 2 MG tablet Take 2 mg by mouth every 6 (six) hours as needed for anxiety.    Historical Provider, MD  gabapentin (NEURONTIN) 100 MG capsule Take 100 mg by mouth 4 (four) times daily.  09/16/16   Historical Provider, MD  HYDROcodone-acetaminophen (NORCO/VICODIN) 5-325 MG tablet Take 1 tablet by mouth 2 (two) times daily.     Historical Provider, MD  ipratropium (ATROVENT) 0.02 % nebulizer solution Inhale 1 mL into the lungs every 4 (four) hours as needed.  08/16/16   Historical Provider, MD  ipratropium (ATROVENT) 0.02 % nebulizer solution USE 1 VIAL BY NEBULIZATION 4 (FOUR) TIMES DAILY. 02/04/17   Brand Males, MD  metoprolol succinate (TOPROL-XL) 25 MG 24 hr tablet Take 1/2 tablet by mouth daily 01/20/17   Peter M Martinique, MD  montelukast (SINGULAIR) 10 MG tablet TAKE 1 TABLET BY MOUTH AT BEDTIME 10/20/16   [name], MD  ondansetron (ZOFRAN ODT) 8 MG disintegrating tablet Take 1 tablet (8 mg total) by mouth every 8 (eight) hours as needed for nausea or vomiting. 09/01/16   Tatyana Kirichenko, PA-C  oxybutynin (DITROPAN-XL) 10 MG 24 hr tablet Take 10 mg by mouth at bedtime.  08/11/16   Historical Provider, MD  OXYGEN Inhale 1.5 L into the lungs. For naps and sleep.    Historical Provider, MD  pantoprazole (PROTONIX) 40 MG tablet Take 1 tablet by mouth every morning.  07/24/13   Historical Provider, MD  polyethylene glycol (MIRALAX / GLYCOLAX) packet Take 17 g by mouth daily as needed for mild constipation.     Historical Provider,  MD  predniSONE (DELTASONE) 10 MG tablet Take 4 tabs po x 1 day, then 3 x 1 day, then 2 x 1 day, then 1 x 1 day, then 1/2 x 1 day then stop. 01/20/17   Brand Males, MD  rosuvastatin (CRESTOR) 5 MG tablet Take 5 mg by mouth once a week. Sunday 07/30/16   Historical Provider, MD  tiZANidine (ZANAFLEX) 2 MG tablet Take by mouth every 6 (six) hours as needed for muscle spasms.    Historical Provider, MD  traZODone (DESYREL) 100 MG tablet Take 1 tablet (100 mg total) by mouth at bedtime. 01/13/17   Kathlee Nations, MD  warfarin (COUMADIN) 5 MG tablet TAKE 1/2 TO 1 TABLET BY MOUTH EVERY DAY OR AS DIRECTED BY COUMADIN CLINIC (FILLABLE 09/02/16) 12/15/16   Peter M Martinique, MD    Family  History Family History  Problem Relation Age of Onset  . Dementia Mother   . Diabetes Mother   . Alzheimer's disease Mother   . Heart attack Brother 66  . Heart attack Father   . Schizophrenia Sister   . Diabetes Sister   . Tremor Sister     Social History Social History  Substance Use Topics  . Smoking status: Former Smoker    Quit date: 06/02/2010  . Smokeless tobacco: Never Used  . Alcohol use No     Allergies   Alprazolam; Pseudoephedrine hcl er; Budesonide-formoterol fumarate; Aciphex [rabeprazole sodium]; Avelox [moxifloxacin hcl in nacl]; Crestor [rosuvastatin calcium]; Esomeprazole magnesium; Flonase [fluticasone propionate]; Iodine; Loratadine; Lotrimin [clotrimazole]; Lunesta [eszopiclone]; Other; Oxcarbazepine; Statins; Zolpidem tartrate; Bextra [valdecoxib]; Ceclor [cefaclor]; Cephalexin; Covera-hs [verapamil hcl]; Dicyclomine hcl; and Tessalon perles   Review of Systems Review of Systems  Constitutional: Negative for chills and fever.  HENT: Negative for sore throat.   Eyes: Negative for redness.  Respiratory: Positive for cough and shortness of breath.   Cardiovascular: Positive for chest pain. Negative for leg swelling.  Gastrointestinal: Negative for abdominal pain.  Genitourinary: Negative  for flank pain.  Musculoskeletal: Negative for back pain and neck pain.  Skin: Negative for rash.  Neurological: Negative for headaches.  Hematological: Does not bruise/bleed easily.  Psychiatric/Behavioral: Negative for confusion.     Physical Exam Updated Vital Signs BP (!) 161/111   Pulse (!) 113   Resp 20   SpO2 93%   Physical Exam  Constitutional: She appears well-developed and well-nourished. No distress.  HENT:  Mouth/Throat: Oropharynx is clear and moist.  Eyes: Conjunctivae are normal. No scleral icterus.  Neck: Neck supple. No tracheal deviation present.  Cardiovascular: Normal heart sounds and intact distal pulses.  Exam reveals no gallop and no friction rub.   No murmur heard. Irregular rhythm. Tachycardic.   Pulmonary/Chest: Effort normal. No respiratory distress.  Diminished bs bil. Wheezing. Few basilar rales  Abdominal: Normal appearance. She exhibits no distension.  Musculoskeletal:  Mild edema.   Neurological: She is alert.  Skin: Skin is warm and dry. No rash noted. She is not diaphoretic.  Psychiatric:  Anxious.   Nursing note and vitals reviewed.    ED Treatments / Results  Labs (all labs ordered are listed, but only abnormal results are displayed) Results for orders placed or performed during the hospital encounter of 24/26/83  Basic metabolic panel  Result Value Ref Range   Sodium 138 135 - 145 mmol/L   Potassium 3.8 3.5 - 5.1 mmol/L   Chloride 103 101 - 111 mmol/L   CO2 27 22 - 32 mmol/L   Glucose, Bld 137 (H) 65 - 99 mg/dL   BUN 10 6 - 20 mg/dL   Creatinine, Ser 0.93 0.44 - 1.00 mg/dL   Calcium 9.2 8.9 - 10.3 mg/dL   GFR calc non Af Amer >60 >60 mL/min   GFR calc Af Amer >60 >60 mL/min   Anion gap 8 5 - 15  CBC  Result Value Ref Range   WBC 6.3 4.0 - 10.5 K/uL   RBC 3.81 (L) 3.87 - 5.11 MIL/uL   Hemoglobin 10.3 (L) 12.0 - 15.0 g/dL   HCT 33.4 (L) 36.0 - 46.0 %   MCV 87.7 78.0 - 100.0 fL   MCH 27.0 26.0 - 34.0 pg   MCHC 30.8 30.0  - 36.0 g/dL   RDW 15.3 11.5 - 15.5 %   Platelets 239 150 - 400 K/uL  Protime-INR  Result  Value Ref Range   Prothrombin Time 24.8 (H) 11.4 - 15.2 seconds   INR 2.20   Brain natriuretic peptide  Result Value Ref Range   B Natriuretic Peptide 685.9 (H) 0.0 - 100.0 pg/mL  I-stat troponin, ED  Result Value Ref Range   Troponin i, poc 0.01 0.00 - 0.08 ng/mL   Comment 3          I-Stat CG4 Lactic Acid, ED  Result Value Ref Range   Lactic Acid, Venous 1.63 0.5 - 1.9 mmol/L   Dg Chest 2 View  Result Date: 02/08/2017 CLINICAL DATA:  Shortness of breath, history of COPD, hypertension, obesity, atrial fibrillation, former smoker. EXAM: CHEST  2 VIEW COMPARISON:  PA and lateral chest x-ray of September 06, 2016 FINDINGS: The lungs are mildly hyperinflated. The interstitial markings are coarse. The cardiac silhouette is enlarged. The pulmonary vascularity is not engorged. The mediastinum is normal in width. There is calcification in the wall of the aortic arch. The trachea is midline. There is no pleural effusion. The bony thorax exhibits no acute abnormality. IMPRESSION: Chronic bronchitic changes, stable. Stable cardiomegaly without pulmonary vascular congestion. Thoracic aortic atherosclerosis. Electronically Signed   By: David  Martinique M.D.   On: 02/08/2017 10:34   Dg Chest Portable 1 View  Result Date: 02/08/2017 CLINICAL DATA:  Shortness of breath and chest pain EXAM: PORTABLE CHEST 1 VIEW COMPARISON:  February 08, 2017 study obtained earlier in the day FINDINGS: Cardiomegaly persists. The pulmonary vascularity is within normal limits. There is no frank edema or consolidation. There is aortic atherosclerosis. No adenopathy. No bone lesions. IMPRESSION: Stable cardiomegaly. No edema or consolidation. There is aortic atherosclerosis. Electronically Signed   By: Lowella Grip III M.D.   On: 02/08/2017 12:55    EKG  EKG Interpretation  Date/Time:  Monday February 08 2017 12:23:12 EDT Ventricular Rate:    108 PR Interval:    QRS Duration: 96 QT Interval:  388 QTC Calculation: 519 R Axis:   91 Text Interpretation:  Atrial fibrillation Rightward axis Nonspecific T wave abnormality No significant change since last tracing Confirmed by Ashok Cordia  MD, Lennette Bihari ([id number]) on 02/08/2017 12:43:39 PM       Radiology Dg Chest 2 View  Result Date: 02/08/2017 CLINICAL DATA:  Shortness of breath, history of COPD, hypertension, obesity, atrial fibrillation, former smoker. EXAM: CHEST  2 VIEW COMPARISON:  PA and lateral chest x-ray of September 06, 2016 FINDINGS: The lungs are mildly hyperinflated. The interstitial markings are coarse. The cardiac silhouette is enlarged. The pulmonary vascularity is not engorged. The mediastinum is normal in width. There is calcification in the wall of the aortic arch. The trachea is midline. There is no pleural effusion. The bony thorax exhibits no acute abnormality. IMPRESSION: Chronic bronchitic changes, stable. Stable cardiomegaly without pulmonary vascular congestion. Thoracic aortic atherosclerosis. Electronically Signed   By: David  Martinique M.D.   On: 02/08/2017 10:34   Dg Chest Portable 1 View  Result Date: 02/08/2017 CLINICAL DATA:  Shortness of breath and chest pain EXAM: PORTABLE CHEST 1 VIEW COMPARISON:  February 08, 2017 study obtained earlier in the day FINDINGS: Cardiomegaly persists. The pulmonary vascularity is within normal limits. There is no frank edema or consolidation. There is aortic atherosclerosis. No adenopathy. No bone lesions. IMPRESSION: Stable cardiomegaly. No edema or consolidation. There is aortic atherosclerosis. Electronically Signed   By: Lowella Grip III M.D.   On: 02/08/2017 12:55    Procedures Procedures (including critical care time)  Medications Ordered in  ED Medications  HYDROcodone-acetaminophen (NORCO/VICODIN) 5-325 MG per tablet 1 tablet (not administered)  gabapentin (NEURONTIN) capsule 100 mg (not administered)     Initial Impression /  Assessment and Plan / ED Course  I have reviewed the triage vital signs and the nursing notes.  Pertinent labs & imaging results that were available during my care of the patient were reviewed by me and considered in my medical decision making (see chart for details).  Labs. cxr.  Patient given a dose of her Neurontin and hydrocodone.   Reviewed nursing notes and prior charts for additional history.   cxr with no overt edema, however bnp is increased from prior, and basilar rales. Lasix iv.   Wheezing. Albuterol nebs.  Given cp, dyspnea, will admit.     Final Clinical Impressions(s) / ED Diagnoses   Final diagnoses:  None    New Prescriptions New Prescriptions   No medications on file     Lajean Saver, MD 02/08/17 1431

## 2017-02-08 NOTE — ED Triage Notes (Signed)
Pt is on home O2 for COPD and use to be intermittent but now is constant.  Pt states she takes 5 steps and is completely out of breath.  Pt has cough.  Pt states feels like the same as when she had PNA. PT is on coumadin

## 2017-02-08 NOTE — Patient Instructions (Addendum)
  Must go to ER now  for further evaluation and treatment.   IF you received an x-ray today, you will receive an invoice from [facility]. Please contact [facility] at [phone] with questions or concerns regarding your invoice.   IF you received labwork today, you will receive an invoice from [facility]. Please contact [facility] at [phone] with questions or concerns regarding your invoice.   Our billing staff will not be able to assist you with questions regarding bills from these companies.  You will be contacted with the lab results as soon as they are available. The fastest way to get your results is to activate your My Chart account. Instructions are located on the last page of this paperwork. If you have not heard from [place] regarding the results in 2 weeks, please contact this office.

## 2017-02-08 NOTE — Progress Notes (Signed)
Pt oriented to room.  Call bell at reach.  Instructed to call for assistance.  Verbalized understanding.  Dr. Marily Memos notified of pt's arrival to floor and needing diet order.  Karie Kirks, Therapist, sports.

## 2017-02-09 ENCOUNTER — Other Ambulatory Visit (HOSPITAL_COMMUNITY): Payer: Self-pay

## 2017-02-09 ENCOUNTER — Inpatient Hospital Stay (HOSPITAL_COMMUNITY): Payer: Medicare Other

## 2017-02-09 DIAGNOSIS — I340 Nonrheumatic mitral (valve) insufficiency: Secondary | ICD-10-CM

## 2017-02-09 LAB — BASIC METABOLIC PANEL
Anion gap: 13 (ref 5–15)
BUN: 9 mg/dL (ref 6–20)
CHLORIDE: 100 mmol/L — AB (ref 101–111)
CO2: 26 mmol/L (ref 22–32)
Calcium: 9.5 mg/dL (ref 8.9–10.3)
Creatinine, Ser: 0.99 mg/dL (ref 0.44–1.00)
GFR calc Af Amer: >60 mL/min (ref >60)
GFR calc non Af Amer: 56 mL/min — ABNORMAL LOW (ref >60)
Glucose, Bld: 112 mg/dL — ABNORMAL HIGH (ref 65–99)
POTASSIUM: 4 mmol/L (ref 3.5–5.1)
SODIUM: 139 mmol/L (ref 135–145)

## 2017-02-09 LAB — PROTIME-INR
INR: 1.88
Prothrombin Time: 21.9 seconds — ABNORMAL HIGH (ref 11.4–15.2)

## 2017-02-09 LAB — ECHOCARDIOGRAM COMPLETE
Height: 64 in
WEIGHTICAEL: 3046.4 [oz_av]

## 2017-02-09 MED ORDER — FUROSEMIDE 10 MG/ML IJ SOLN
40.0000 mg | Freq: Two times a day (BID) | INTRAMUSCULAR | Status: DC
  Administered 2017-02-09 – 2017-02-10 (×3): 40 mg via INTRAVENOUS
  Filled 2017-02-09 (×3): qty 4

## 2017-02-09 MED ORDER — WARFARIN SODIUM 5 MG PO TABS
5.0000 mg | ORAL_TABLET | Freq: Once | ORAL | Status: AC
  Administered 2017-02-09: 5 mg via ORAL
  Filled 2017-02-09: qty 1

## 2017-02-09 MED ORDER — DESVENLAFAXINE SUCCINATE ER 50 MG PO TB24
50.0000 mg | ORAL_TABLET | Freq: Every day | ORAL | Status: DC
  Administered 2017-02-09 – 2017-02-12 (×4): 50 mg via ORAL
  Filled 2017-02-09 (×4): qty 1

## 2017-02-09 MED ORDER — DESVENLAFAXINE SUCCINATE ER 50 MG PO TB24
50.0000 mg | ORAL_TABLET | Freq: Every day | ORAL | Status: DC
  Filled 2017-02-09 (×2): qty 1

## 2017-02-09 NOTE — Progress Notes (Signed)
[facility] for Coumadin Indication: atrial fibrillation   Labs:  Recent Labs  02/08/17 1233 02/09/17 0523  HGB 10.3*  --   HCT 33.4*  --   PLT 239  --   LABPROT 24.8* 21.9*  INR 2.20 1.88  CREATININE 0.93 0.99    Estimated Creatinine Clearance: 55.5 mL/min (by C-G formula based on SCr of 0.99 mg/dL).    Assessment: 71yof on Coumadin for afib. INR therapeutic on admission at 2.2.  Home dose: 2.5mg  daily except 5mg  on Monday/Friday - last taken 4/8  INR low today at 1.88  Goal of Therapy:  INR 2-3 Monitor platelets by anticoagulation protocol: Yes   Plan:  1) Coumadin 5mg  today 2) Daily INR  Thank you Anette Guarneri, PharmD [phone] 02/09/2017,10:12 AM

## 2017-02-09 NOTE — Progress Notes (Addendum)
PROGRESS NOTE    Laurie Horton  ZDG:[id number] DOB: Nov 30, 1945 DOA: 02/08/2017 PCP: Laurie Forts, MD  Brief Narrative:Laurie Horton is a 71 y.o. female with medical history significant for severe COPD on chronic oxygen with activity and while sleeping since 2006, atrial fibrillation on beta blocker, known PFO, fibromyalgia and chronic chest pain, generalized anxiety disorder, cholesterolemia, hypertension and known grade 1 diastolic dysfunction based on echocardiogram 2015. Patient reported increased palpitations, nonproductive cough and increased dyspnea on exertion, new orthopnea.  Assessment & Plan:   Acute on chronic respiratory failure with hypoxia  -due to acute diastolic CHF, has underlying COPD too, on 2L home O2 at baseline -now on 4L O2 -improving, see below    Acute diastolic heart failure, NYHA class 1  -Echo 2015: EF 59-56%, grade 1 diastolic dysfunction, mild concentric hypertrophy, mild pulmonic regurgitation -improving, continue IV lasix 40 mg IV q 12 hrs -negative 2.9L -repeat ECHO -monitor I/Os, Bmet in am    COPD, severe/Chronic resp failure -Home O2 -No active wheezing and moving air quite well bilaterally,  no indication to initiate steroids at this time -Continue home nebs    Essential hypertension -BP improved with diuresis, continue Toprol    PATENT FORAMEN OVALE -Followed by Dr. Martinique in the outpatient setting    Atrial fibrillation with RVR  -HR improved, continue TOprol -Continue Coumadin/pharmacy to manage -CHADVASc = 6    Pure hypercholesterolemia -Continue Crestor    Generalized anxiety disorder/Fibromyalgia and chronic chest pain -Continue Pristiq, Valium prn, Neurontin, Zanaflex, and trazodone   DVT prophylaxis: Warfarin Code Status: DO NOT RESUSCITATE  Family Communication: no family at bedside Dispo: home in 1-2days when adequately diuresed    Subjective: Breathing better, she had multiple complaints about staff at  urgent care, staff on floor and [facility]  Objective: Vitals:   02/09/17 0200 02/09/17 0542 02/09/17 0937 02/09/17 0939  BP: 105/77 131/90 (!) 93/46 102/74  Pulse: 81 83 92   Resp: 18 18 19    Temp:  97.9 F (36.6 C) 98.4 F (36.9 C)   TempSrc:  Oral Oral   SpO2: 92% 94% 90%   Weight:  86.4 kg (190 lb 6.4 oz)    Height:        Intake/Output Summary (Last 24 hours) at 02/09/17 1209 Last data filed at 02/09/17 0900  Gross per 24 hour  Intake              780 ml  Output             3400 ml  Net            -2620 ml   Filed Weights   02/08/17 1742 02/09/17 0542  Weight: 86.8 kg (191 lb 6.4 oz) 86.4 kg (190 lb 6.4 oz)    Examination:  General exam: Appears calm and comfortable  Respiratory system: fine crackles at both bases Cardiovascular system: S1 & S2 heard, RRR. + JVD, murmurs,  No pedal edema. Gastrointestinal system: Abdomen is nondistended, soft and nontender.  Normal bowel sounds heard. Central nervous system: Alert and oriented. No focal neurological deficits. Extremities: Symmetric 5 x 5 power. Skin: No rashes, lesions or ulcers Psychiatry: Judgement and insight appear normal. Mood & affect appropriate.     Data Reviewed:   CBC:  Recent Labs Lab 02/08/17 1233  WBC 6.3  HGB 10.3*  HCT 33.4*  MCV 87.7  PLT 387   Basic Metabolic Panel:  Recent Labs Lab 02/08/17 1233 02/09/17 0523  NA  138 139  K 3.8 4.0  CL 103 100*  CO2 27 26  GLUCOSE 137* 112*  BUN 10 9  CREATININE 0.93 0.99  CALCIUM 9.2 9.5  MG 1.8  --    GFR: Estimated Creatinine Clearance: 55.5 mL/min (by C-G formula based on SCr of 0.99 mg/dL). Liver Function Tests: No results for input(s): AST, ALT, ALKPHOS, BILITOT, PROT, ALBUMIN in the last 168 hours. No results for input(s): LIPASE, AMYLASE in the last 168 hours. No results for input(s): AMMONIA in the last 168 hours. Coagulation Profile:  Recent Labs Lab 02/08/17 1233 02/09/17 0523  INR 2.20 1.88   Cardiac  Enzymes: No results for input(s): CKTOTAL, CKMB, CKMBINDEX, TROPONINI in the last 168 hours. BNP (last 3 results) No results for input(s): PROBNP in the last 8760 hours. HbA1C: No results for input(s): HGBA1C in the last 72 hours. CBG: No results for input(s): GLUCAP in the last 168 hours. Lipid Profile: No results for input(s): CHOL, HDL, LDLCALC, TRIG, CHOLHDL, LDLDIRECT in the last 72 hours. Thyroid Function Tests: No results for input(s): TSH, T4TOTAL, FREET4, T3FREE, THYROIDAB in the last 72 hours. Anemia Panel: No results for input(s): VITAMINB12, FOLATE, FERRITIN, TIBC, IRON, RETICCTPCT in the last 72 hours. Urine analysis:    Component Value Date/Time   COLORURINE YELLOW 09/06/2016 1401   APPEARANCEUR CLEAR 09/06/2016 1401   LABSPEC 1.011 09/06/2016 1401   PHURINE 8.5 (H) 09/06/2016 1401   GLUCOSEU NEGATIVE 09/06/2016 1401   HGBUR NEGATIVE 09/06/2016 1401   BILIRUBINUR NEGATIVE 09/06/2016 1401   BILIRUBINUR negative 07/31/2016 1400   KETONESUR NEGATIVE 09/06/2016 1401   PROTEINUR NEGATIVE 09/06/2016 1401   UROBILINOGEN 1.0 07/31/2016 1400   UROBILINOGEN 0.2 05/04/2013 1132   NITRITE NEGATIVE 09/06/2016 1401   LEUKOCYTESUR TRACE (A) 09/06/2016 1401   Sepsis Labs: @LABRCNTIP (procalcitonin:4,lacticidven:4)  )No results found for this or any previous visit (from the past 240 hour(s)).       Radiology Studies: Dg Chest 2 View  Result Date: 02/09/2017 CLINICAL DATA:  Follow-up acute heart failure. EXAM: CHEST  2 VIEW COMPARISON:  February 08, 2017 FINDINGS: Cardiomegaly. Interstitial prominence suggests mild pulmonary venous congestion/ edema. No other interval changes or acute abnormalities. IMPRESSION: Cardiomegaly and mild pulmonary venous congestion/edema. Electronically Signed   By: Dorise Bullion III M.D   On: 02/09/2017 10:29   Dg Chest 2 View  Result Date: 02/08/2017 CLINICAL DATA:  Shortness of breath, history of COPD, hypertension, obesity, atrial fibrillation,  former smoker. EXAM: CHEST  2 VIEW COMPARISON:  PA and lateral chest x-ray of September 06, 2016 FINDINGS: The lungs are mildly hyperinflated. The interstitial markings are coarse. The cardiac silhouette is enlarged. The pulmonary vascularity is not engorged. The mediastinum is normal in width. There is calcification in the wall of the aortic arch. The trachea is midline. There is no pleural effusion. The bony thorax exhibits no acute abnormality. IMPRESSION: Chronic bronchitic changes, stable. Stable cardiomegaly without pulmonary vascular congestion. Thoracic aortic atherosclerosis. Electronically Signed   By: David  Martinique M.D.   On: 02/08/2017 10:34   Dg Chest Portable 1 View  Result Date: 02/08/2017 CLINICAL DATA:  Shortness of breath and chest pain EXAM: PORTABLE CHEST 1 VIEW COMPARISON:  February 08, 2017 study obtained earlier in the day FINDINGS: Cardiomegaly persists. The pulmonary vascularity is within normal limits. There is no frank edema or consolidation. There is aortic atherosclerosis. No adenopathy. No bone lesions. IMPRESSION: Stable cardiomegaly. No edema or consolidation. There is aortic atherosclerosis. Electronically Signed   By: Gwyndolyn Saxon  Jasmine December III M.D.   On: 02/08/2017 12:55        Scheduled Meds: . furosemide  40 mg Intravenous BID  . gabapentin  100 mg Oral QID  . HYDROcodone-acetaminophen  1 tablet Oral BID  . metoprolol succinate  12.5 mg Oral Daily  . montelukast  10 mg Oral Daily  . oxybutynin  10 mg Oral Daily  . pantoprazole  40 mg Oral q morning - 10a  . [START ON 02/14/2017] rosuvastatin  5 mg Oral Q Sun  . sodium chloride flush  3 mL Intravenous Q12H  . traZODone  100 mg Oral QHS  . venlafaxine XR  75 mg Oral Q breakfast  . warfarin  5 mg Oral ONCE-1800  . Warfarin - Pharmacist Dosing Inpatient   Does not apply q1800   Continuous Infusions:   LOS: 1 day    Time spent: 43min    Domenic Polite, MD [facility] Pager [phone]  If 7PM-7AM,  please contact night-coverage [email or web address] Password [email or web address] 02/09/2017, 12:09 PM

## 2017-02-09 NOTE — Care Management Note (Signed)
Case Management Note  Patient Details  Name: Laurie Horton MRN: [id number] Date of Birth: 1946/07/17  Subjective/Objective:     Admitted with COPD with chronic 02              Action/Plan: PCP: [name], MD, has private insurance with [facility] with prescription drug coverage; CM following for DCP  Expected Discharge Date:   possibly 02/13/2017               Expected Discharge Plan:  Home/Self Care  Discharge planning Services  CM Consult  Status of Service:  In process, will continue to follow  Sherrilyn Rist [phone] 02/09/2017, 2:34 PM

## 2017-02-09 NOTE — Progress Notes (Signed)
  Echocardiogram 2D Echocardiogram has been performed.  Kilyn Maragh T Folashade Gamboa 02/09/2017, 11:27 AM

## 2017-02-09 NOTE — Progress Notes (Signed)
  Echocardiogram 2D Echocardiogram has been performed.  Laurie Horton T Laurie Horton 02/09/2017, 12:47 PM

## 2017-02-10 ENCOUNTER — Encounter (HOSPITAL_COMMUNITY): Payer: Self-pay | Admitting: Nurse Practitioner

## 2017-02-10 ENCOUNTER — Telehealth: Payer: Self-pay | Admitting: Student

## 2017-02-10 DIAGNOSIS — I5043 Acute on chronic combined systolic (congestive) and diastolic (congestive) heart failure: Secondary | ICD-10-CM

## 2017-02-10 LAB — PROTIME-INR
INR: 2.26
PROTHROMBIN TIME: 25.3 s — AB (ref 11.4–15.2)

## 2017-02-10 LAB — BASIC METABOLIC PANEL
ANION GAP: 10 (ref 5–15)
BUN: 15 mg/dL (ref 6–20)
CALCIUM: 9.1 mg/dL (ref 8.9–10.3)
CO2: 31 mmol/L (ref 22–32)
CREATININE: 1.01 mg/dL — AB (ref 0.44–1.00)
Chloride: 97 mmol/L — ABNORMAL LOW (ref 101–111)
GFR calc Af Amer: >60 mL/min (ref >60)
GFR calc non Af Amer: 55 mL/min — ABNORMAL LOW (ref >60)
GLUCOSE: 116 mg/dL — AB (ref 65–99)
Potassium: 3.7 mmol/L (ref 3.5–5.1)
Sodium: 138 mmol/L (ref 135–145)

## 2017-02-10 LAB — TROPONIN I
TROPONIN I: 0.03 ng/mL — AB (ref <0.03)
Troponin I: 0.03 ng/mL (ref <0.03)

## 2017-02-10 LAB — MAGNESIUM: Magnesium: 1.9 mg/dL (ref 1.7–2.4)

## 2017-02-10 MED ORDER — POTASSIUM CHLORIDE CRYS ER 20 MEQ PO TBCR
40.0000 meq | EXTENDED_RELEASE_TABLET | Freq: Once | ORAL | Status: AC
  Administered 2017-02-10: 40 meq via ORAL
  Filled 2017-02-10: qty 2

## 2017-02-10 MED ORDER — CARVEDILOL 6.25 MG PO TABS
6.2500 mg | ORAL_TABLET | Freq: Two times a day (BID) | ORAL | Status: DC
  Administered 2017-02-10 – 2017-02-11 (×2): 6.25 mg via ORAL
  Filled 2017-02-10 (×2): qty 1

## 2017-02-10 MED ORDER — LOSARTAN POTASSIUM 25 MG PO TABS
25.0000 mg | ORAL_TABLET | Freq: Every day | ORAL | Status: DC
  Administered 2017-02-10 – 2017-02-12 (×2): 25 mg via ORAL
  Filled 2017-02-10 (×3): qty 1

## 2017-02-10 MED ORDER — WARFARIN SODIUM 2.5 MG PO TABS
2.5000 mg | ORAL_TABLET | Freq: Once | ORAL | Status: AC
  Administered 2017-02-10: 2.5 mg via ORAL
  Filled 2017-02-10: qty 1

## 2017-02-10 MED ORDER — NITROGLYCERIN 0.4 MG SL SUBL: 0.4000 mg | SUBLINGUAL_TABLET | SUBLINGUAL | Status: DC | PRN

## 2017-02-10 MED ORDER — ASPIRIN EC 325 MG PO TBEC: 325.0000 mg | DELAYED_RELEASE_TABLET | Freq: Every day | ORAL | Status: DC

## 2017-02-10 MED ORDER — MORPHINE SULFATE (PF) 2 MG/ML IV SOLN
2.0000 mg | INTRAVENOUS | Status: DC | PRN
  Administered 2017-02-10: 2 mg via INTRAVENOUS
  Filled 2017-02-10: qty 1

## 2017-02-10 MED ORDER — FUROSEMIDE 40 MG PO TABS
40.0000 mg | ORAL_TABLET | Freq: Two times a day (BID) | ORAL | Status: DC
  Administered 2017-02-10 – 2017-02-12 (×4): 40 mg via ORAL
  Filled 2017-02-10 (×4): qty 1

## 2017-02-10 NOTE — Progress Notes (Signed)
PROGRESS NOTE    Laurie Horton  DGU:[id number] DOB: Jan 23, 1946 DOA: 02/08/2017 PCP: Laurie Forts, MD  Brief Narrative:Laurie Horton is a 71 y.o. female with medical history significant for severe COPD on chronic oxygen with activity and while sleeping since 2006, atrial fibrillation on beta blocker, known PFO, fibromyalgia and chronic chest pain, generalized anxiety disorder, cholesterolemia, hypertension and known grade 1 diastolic dysfunction based on echocardiogram 2015. Patient reported increased palpitations, nonproductive cough and increased dyspnea on exertion, new orthopnea.  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 No ischemic workup here. -monitor I/Os, Bmet in am    COPD, severe/Chronic resp failure -Home O2 -No active wheezing and moving air quite well bilaterally,  no indication to initiate steroids at this time -Continue home nebs    Essential hypertension -BP improved with diuresis, continue Toprol    PATENT FORAMEN OVALE -Followed by Dr. Martinique in the outpatient setting    Atrial fibrillation with RVR  -HR improved, continue TOprol -Continue Coumadin/pharmacy to manage -CHADVASc = 6    Pure hypercholesterolemia -Continue Crestor    Generalized anxiety disorder/Fibromyalgia and chronic chest pain -Continue Pristiq, Valium prn, Neurontin, Zanaflex, and trazodone   DVT prophylaxis: Warfarin Code Status: DO NOT RESUSCITATE  Family Communication: family at bedside Dispo: home in 1-2days when adequately  diuresed    Subjective: Continues to have shortness of breath on exertion, no chest pain or abdominal pain.  Objective: Vitals:   02/09/17 2027 02/10/17 0500 02/10/17 1200 02/10/17 1329  BP: 109/65 108/64 (!) 109/57 (!) 104/59  Pulse: 92 95 93 100  Resp: 20 20 18    Temp: 98.5 F (36.9 C) 97.9 F (36.6 C) 98.4 F (36.9 C)   TempSrc: Oral Oral Oral   SpO2: 91% 94% (!) 88%   Weight:  85.8 kg (189 lb 1.6 oz)    Height:        Intake/Output Summary (Last 24 hours) at 02/10/17 1759 Last data filed at 02/10/17 0900  Gross per 24 hour  Intake              720 ml  Output             1200 ml  Net             -480 ml   Filed Weights   02/08/17 1742 02/09/17 0542 02/10/17 0500  Weight: 86.8 kg (191 lb 6.4 oz) 86.4 kg (190 lb 6.4 oz) 85.8 kg (189 lb 1.6 oz)    Examination:  General exam: Appears calm and comfortable  Respiratory system: fine crackles at both bases Cardiovascular system: S1 & S2 heard, RRR. + JVD, murmurs,  No pedal edema. Gastrointestinal system: Abdomen is nondistended, soft and nontender.  Normal bowel sounds heard. Central nervous system: Alert and oriented. No focal neurological deficits. Extremities: Symmetric 5 x 5 power. Skin: No rashes, lesions or ulcers Psychiatry: Judgement and insight appear normal. Mood & affect appropriate.     Data Reviewed:   CBC:  Recent Labs Lab 02/08/17 1233  WBC 6.3  HGB 10.3*  HCT 33.4*  MCV 87.7  PLT 469   Basic Metabolic Panel:  Recent Labs Lab 02/08/17 1233 02/09/17 0523 02/10/17 0317  NA 138 139 138  K 3.8 4.0 3.7  CL 103 100* 97*  CO2 27 26 31   GLUCOSE 137* 112* 116*  BUN 10 9 15   CREATININE 0.93 0.99 1.01*  CALCIUM 9.2 9.5 9.1  MG 1.8  --  1.9   GFR: Estimated Creatinine Clearance: 54.1 mL/min (A) (by C-G formula based on SCr of 1.01 mg/dL (H)). Liver Function Tests: No results for input(s): AST, ALT, ALKPHOS, BILITOT, PROT, ALBUMIN in the last 168 hours. No results for input(s): LIPASE,  AMYLASE in the last 168 hours. No results for input(s): AMMONIA in the last 168 hours. Coagulation Profile:  Recent Labs Lab 02/08/17 1233 02/09/17 0523 02/10/17 0317  INR 2.20 1.88 2.26   Cardiac Enzymes:  Recent Labs Lab 02/10/17 1354  TROPONINI 0.03*   BNP (last 3 results) No results for input(s): PROBNP in the last 8760 hours. HbA1C: No results for input(s): HGBA1C in the last 72 hours. CBG: No results for input(s): GLUCAP in the last 168 hours. Lipid Profile: No results for input(s): CHOL, HDL, LDLCALC, TRIG, CHOLHDL, LDLDIRECT in the last 72 hours. Thyroid Function Tests: No results for input(s): TSH, T4TOTAL, FREET4, T3FREE, THYROIDAB in the last 72 hours. Anemia Panel: No results for input(s): VITAMINB12, FOLATE, FERRITIN, TIBC, IRON, RETICCTPCT in the last 72 hours. Urine analysis:    Component Value Date/Time   COLORURINE YELLOW 09/06/2016 1401   APPEARANCEUR CLEAR 09/06/2016 1401   LABSPEC 1.011 09/06/2016 1401   PHURINE 8.5 (H) 09/06/2016 1401   GLUCOSEU NEGATIVE 09/06/2016 1401   HGBUR NEGATIVE 09/06/2016 1401   BILIRUBINUR NEGATIVE 09/06/2016 1401   BILIRUBINUR negative 07/31/2016 1400   KETONESUR NEGATIVE 09/06/2016 1401   PROTEINUR NEGATIVE 09/06/2016 1401   UROBILINOGEN 1.0 07/31/2016 1400   UROBILINOGEN 0.2 05/04/2013 1132   NITRITE NEGATIVE 09/06/2016 1401   LEUKOCYTESUR TRACE (A) 09/06/2016 1401   Sepsis Labs: @LABRCNTIP (procalcitonin:4,lacticidven:4)  )No results found for this or any previous visit (from the past 240 hour(s)).       Radiology Studies: Dg Chest 2 View  Result Date: 02/09/2017 CLINICAL DATA:  Follow-up acute heart failure. EXAM: CHEST  2 VIEW COMPARISON:  February 08, 2017 FINDINGS: Cardiomegaly. Interstitial prominence suggests mild pulmonary venous congestion/ edema. No other interval changes or acute abnormalities. IMPRESSION: Cardiomegaly and mild pulmonary venous congestion/edema. Electronically Signed   By: Dorise Bullion III M.D   On: 02/09/2017 10:29        Scheduled Meds: . carvedilol  6.25 mg Oral BID WC  . desvenlafaxine  50 mg Oral Daily  . furosemide  40 mg Oral BID  . gabapentin  100 mg Oral QID  . HYDROcodone-acetaminophen  1 tablet Oral BID  . losartan  25 mg Oral Daily  . montelukast  10 mg Oral Daily  . oxybutynin  10 mg Oral Daily  . pantoprazole  40 mg Oral q morning - 10a  . [START ON 02/14/2017] rosuvastatin  5 mg Oral Q Sun  . sodium chloride flush  3 mL Intravenous Q12H  . traZODone  100 mg Oral QHS  . Warfarin - Pharmacist Dosing Inpatient   Does not apply q1800   Continuous Infusions:   LOS: 2 days    Time spent: 83min  Author:  Berle Mull, MD [facility] Pager: [phone] 02/10/2017 6:00 PM     If 7PM-7AM, please contact night-coverage [email or web address] Password [facility] 02/10/2017, 5:59 PM

## 2017-02-10 NOTE — Progress Notes (Signed)
RN notified by RN that pt. Had elevated HR non sustained. Pt. Alert and stable. No distress noted. On coming RN made aware.

## 2017-02-10 NOTE — Progress Notes (Signed)
Patient stated while eating lunch she all of a sudden had right sided chest pressure and felt like someone was squeezing her.  Vital signs stable. EKG obtained.  Dr. Posey Pronto made aware who said to give morphine.  Will continue to monitor patient.

## 2017-02-10 NOTE — Telephone Encounter (Signed)
TCM Phone Call-Please Call-Pt has an appointment on 02-24-17 with [place]. Pt will not be discharged until this weekend.

## 2017-02-10 NOTE — Progress Notes (Signed)
CRITICAL VALUE ALERT  Critical value received: Trop 0.03   Date of notification:4/11/208    Time of notification:  5056  Critical value read back:yes  Nurse who received alert: Rivka Safer, RN/ Enos Fling, RN called MD per Ander Purpura Request   MD notified (1st page): Dr. Posey Pronto   Time of first page:  1530  Responding MD:  Dr Posey Pronto   Time MD responded:  [phone]

## 2017-02-10 NOTE — Progress Notes (Signed)
[place] for Coumadin Indication: atrial fibrillation   Labs:  Recent Labs  02/08/17 1233 02/09/17 0523 02/10/17 0317  HGB 10.3*  --   --   HCT 33.4*  --   --   PLT 239  --   --   LABPROT 24.8* 21.9* 25.3*  INR 2.20 1.88 2.26  CREATININE 0.93 0.99 1.01*    Estimated Creatinine Clearance: 54.1 mL/min (A) (by C-G formula based on SCr of 1.01 mg/dL (H)).    Assessment: 71yof on Coumadin for afib. INR therapeutic on admission at 2.2.  Home dose: 2.5mg  daily except 5mg  on Monday/Friday - last taken 4/8  INR therapeutic  Goal of Therapy:  INR 2-3 Monitor platelets by anticoagulation protocol: Yes   Plan:  1) Coumadin 2.5 mg today 2) Daily INR  Thank you Anette Guarneri, PharmD [phone] 02/10/2017,9:34 AM

## 2017-02-10 NOTE — Progress Notes (Signed)
Patient alarmed 5  Beats v-tach.  Patient asymptomatic.  Dr. Posey Pronto made aware.  Will continue to monitor.

## 2017-02-10 NOTE — Consult Note (Signed)
Cardiology Consult    Patient ID: Laurie Horton MRN: [id number], DOB/AGE: November 01, 1946   Admit date: 02/08/2017 Date of Consult: 02/10/2017  Primary Physician: Nilda Simmer, MD Primary Cardiologist: Dr. [place] Requesting Provider: Dr. Allena Katz  Reason for Consult: CHF exacerbation  Patient Profile    Laurie Horton is a 71 yo female with a PMH significant for grade 1 diastolic dysfunction, patent foramen ovale, HTN, severe COPD, paroxysmal Afib on coumadin, and stroke.   Laurie Horton is a 71 y.o. female who is being seen today for the evaluation of CHF at the request of Dr. Allena Katz.   Past Medical History   Past Medical History:  Diagnosis Date  . Complication of anesthesia    various issues with oxygen  saturations post op  . COPD (chronic obstructive pulmonary disease) (HCC)   . Depression   . Diverticulitis   . DVT (deep venous thrombosis) (HCC)   . Fibromyalgia   . GERD (gastroesophageal reflux disease)   . Hiatal hernia   . History of deviated nasal septum    left- side  . HTN (hypertension)   . Hyperlipidemia   . Obesity   . On supplemental oxygen therapy    concentrator at night @ 1.5 l/m or when sleeps. O2 Sat niormally 87.  . Osteoporosis   . PAT (paroxysmal atrial tachycardia) (HCC)   . PFO (patent foramen ovale)   . PULMONARY NODULE, LEFT LOWER LOBE 10/14/2009   5mm LLL nodule dec 2010. Stable and 4mm in Oct 2012. No further fu  . Right middle lobe pneumonia (HCC) 07/24/2011   First noted at admit 07/10/11. Persists on cxr 07/22/11. Cleared on CT 08/24/11. No further followup  . Stroke (HCC)   . TOBACCO ABUSE 06/04/2009    Past Surgical History:  Procedure Laterality Date  . CARDIOVASCULAR STRESS TEST  12/26/2004   EF 74%. NO EVIDENCE OF ISCHEMIA  . ESOPHAGOGASTRODUODENOSCOPY (EGD) WITH PROPOFOL N/A 04/15/2015   Procedure: ESOPHAGOGASTRODUODENOSCOPY (EGD) WITH PROPOFOL;  Surgeon: Carman Ching, MD;  Location: [facility];  Service: Endoscopy;  Laterality: N/A;    . KNEE ARTHROSCOPY  2000   left  . LAPAROSCOPIC CHOLECYSTECTOMY  04-16-2010   [place]  . MOUTH SURGERY     03-26-15 multiple extractions stitches remains  . TOTAL ABDOMINAL HYSTERECTOMY     post op needed oxygen was told "she gave them a scare"  . TUBAL LIGATION    . US ECHOCARDIOGRAPHY  11/20/2009   EF 55-60%     Allergies  Allergies  Allergen Reactions  . Alprazolam     REACTION: stops breathing  . Iodine Swelling    REACTION: swelling in throat  . Pseudoephedrine Hcl Er Shortness Of Breath  . Budesonide-Formoterol Fumarate     bliusters  . Crestor [Rosuvastatin Calcium] Other (See Comments)    Unable to walk  . Esomeprazole Magnesium Other (See Comments)    REACTION: "bouncing off walls"  . Flonase [Fluticasone Propionate] Other (See Comments)    unspecified  . Loratadine Other (See Comments)    claritin D causes shaking  . Lotrimin [Clotrimazole] Other (See Comments)    blisters  . Lunesta [Eszopiclone] Other (See Comments)    REACTION: "slept for a week"  . Oxcarbazepine Other (See Comments)    Causes deep sleep and dizziness  . Statins Other (See Comments)    unspecified  . Zolpidem Tartrate Other (See Comments)    REACTION: "slept for a week"  . Aciphex [Rabeprazole Sodium] Rash  . Avelox [Moxifloxacin Hcl  In Nacl] Other (See Comments)    Stomach cramps.   Marlowe Aschoff [Valdecoxib] Rash  . Ceclor [Cefaclor] Rash  . Cephalexin Rash    Pt states that she is possibly allergic to this - had a reaction to Cefaclor in the past and she does not want to these class drugs. Added per patient request.  . Covera-Hs [Verapamil Hcl] Palpitations  . Dicyclomine Hcl Rash  . Other Other (See Comments)    Glue from ekg/heart monitor leads --rash +  Any MYCINS  . Tessalon Perles Rash    History of Present Illness    Laurie Horton was seen in clinic with PCP on 02/08/17 for shortness of breath and intermittent chest pain and palpitations. She was transported from clinic to [facility]  for evaluation. Upon arrival, she reported being out of breath after taking 5-10 steps on her home O2 of 2 L [place]. She states she has had worsening shortness of breath for the past week. She also reports pain all over for the past few days including pain in her chest. Over the past week, she has had increased palpitations, productive cough, and increase DOE.   She was recently seen in clinic with Dr. [place] with complaints of sternal pain. She was diagnosed with costochondritis at that time and treated with steroid taper. She was also given ABX by PCP for productive cough of yellow chunky sputum.   Upon arrival to the ED, she was in Afib with rates in the 120s.  She was given lasix in the ED with good urine output. On my interview, she states she was having costochondritis sternal pain, but today experienced a different type of right-sided sternal pain that radiated to the left side. She received morphine, no nitro, and reports some relief in her chest pain. She states that she has had chest pain with walking a few feet, but in the presence of profound dyspnea on exertion. She has not been about to exert herself secondary to COPD and CHF exacerbations.  Inpatient Medications    . desvenlafaxine  50 mg Oral Daily  . furosemide  40 mg Intravenous BID  . gabapentin  100 mg Oral QID  . HYDROcodone-acetaminophen  1 tablet Oral BID  . metoprolol succinate  12.5 mg Oral Daily  . montelukast  10 mg Oral Daily  . oxybutynin  10 mg Oral Daily  . pantoprazole  40 mg Oral q morning - 10a  . [START ON 02/14/2017] rosuvastatin  5 mg Oral Q Sun  . sodium chloride flush  3 mL Intravenous Q12H  . traZODone  100 mg Oral QHS  . warfarin  2.5 mg Oral ONCE-1800  . Warfarin - Pharmacist Dosing Inpatient   Does not apply q1800     Outpatient Medications    Prior to Admission medications   Medication Sig Start Date End Date Taking? Authorizing Provider  desvenlafaxine (PRISTIQ) 50 MG 24 hr tablet Take 1 tablet (50  mg total) by mouth daily. 01/13/17  Yes Cleotis Nipper, MD  diazepam (VALIUM) 2 MG tablet Take 2 mg by mouth every 6 (six) hours as needed for anxiety.   Yes Historical Provider, MD  gabapentin (NEURONTIN) 100 MG capsule Take 100 mg by mouth 4 (four) times daily.  09/16/16  Yes Historical Provider, MD  HYDROcodone-acetaminophen (NORCO/VICODIN) 5-325 MG tablet Take 1 tablet by mouth 2 (two) times daily.    Yes Historical Provider, MD  ipratropium (ATROVENT) 0.02 % nebulizer solution USE 1 VIAL BY NEBULIZATION 4 (FOUR)  TIMES DAILY. Patient taking differently: USE 1 VIAL BY NEBULIZATION VERY EIGHT HOURS 02/04/17  Yes [name], MD  metoprolol succinate (TOPROL-XL) 25 MG 24 hr tablet Take 1/2 tablet by mouth daily Patient taking differently: Take 12.5 mg by mouth daily. Take 1/2 tablet by mouth daily  01/20/17  Yes Kengo Sturges M [place], MD  montelukast (SINGULAIR) 10 MG tablet TAKE 1 TABLET BY MOUTH AT BEDTIME 10/20/16  Yes [name], MD  oxybutynin (DITROPAN-XL) 10 MG 24 hr tablet Take 10 mg by mouth at bedtime.  08/11/16  Yes Historical Provider, MD  OXYGEN Inhale 1.5 L into the lungs. For naps and sleep.   Yes Historical Provider, MD  pantoprazole (PROTONIX) 40 MG tablet Take 1 tablet by mouth every morning.  07/24/13  Yes Historical Provider, MD  polyethylene glycol (MIRALAX / GLYCOLAX) packet Take 17 g by mouth daily as needed for mild constipation.    Yes Historical Provider, MD  rosuvastatin (CRESTOR) 5 MG tablet Take 5 mg by mouth once a week. Sunday 07/30/16  Yes Historical Provider, MD  tiZANidine (ZANAFLEX) 2 MG tablet Take by mouth every 6 (six) hours as needed for muscle spasms.   Yes Historical Provider, MD  traZODone (DESYREL) 100 MG tablet Take 1 tablet (100 mg total) by mouth at bedtime. 01/13/17  Yes Cleotis Nipper, MD  triamcinolone ointment (KENALOG) 0.1 % Apply 1 application topically daily as needed for skin breakdown. 02/03/17  Yes Historical Provider, MD  warfarin (COUMADIN) 5 MG  tablet TAKE 1/2 TO 1 TABLET BY MOUTH EVERY DAY OR AS DIRECTED BY COUMADIN CLINIC (FILLABLE 09/02/16) Patient taking differently: 5mg  on Mondays, Fridays - 2.5mg  all other days 12/15/16  Yes Abelardo Seidner M [place], MD  clarithromycin (BIAXIN) 500 MG tablet Take 1 tablet (500 mg total) by mouth 2 (two) times daily. Patient not taking: Reported on 02/08/2017 01/20/17   [name], MD  ondansetron (ZOFRAN ODT) 8 MG disintegrating tablet Take 1 tablet (8 mg total) by mouth every 8 (eight) hours as needed for nausea or vomiting. Patient not taking: Reported on 02/08/2017 09/01/16   Tatyana Kirichenko, PA-C  predniSONE (DELTASONE) 10 MG tablet Take 4 tabs po x 1 day, then 3 x 1 day, then 2 x 1 day, then 1 x 1 day, then 1/2 x 1 day then stop. Patient not taking: Reported on 02/08/2017 01/20/17   [name], MD     Family History    Family History  Problem Relation Age of Onset  . Dementia Mother   . Diabetes Mother   . Alzheimer's disease Mother   . Heart attack Brother 18  . Heart attack Father   . Schizophrenia Sister   . Diabetes Sister   . Tremor Sister     Social History    Social History   Social History  . Marital status: Divorced    Spouse name: N/A  . Number of children: 2  . Years of education: N/A   Occupational History  . disability    Social History Main Topics  . Smoking status: Former Smoker    Quit date: 06/02/2010  . Smokeless tobacco: Never Used  . Alcohol use No  . Drug use: No  . Sexual activity: Yes   Other Topics Concern  . Not on file   Social History Narrative  . No narrative on file     Review of Systems    General:  No chills, possible fever, No night sweats or weight changes.  Cardiovascular:  Positive chest pain, dyspnea on exertion, No edema, orthopnea, Positive for palpitations, and paroxysmal nocturnal dyspnea. Dermatological: No rash, lesions/masses Respiratory: Positive cough, dyspnea Urologic: No hematuria, dysuria Abdominal:   No nausea,  vomiting, diarrhea, bright red blood per rectum, melena, or hematemesis Neurologic:  No visual changes, changes in mental status. All other systems reviewed and are otherwise negative except as noted above.  Physical Exam    Blood pressure 108/64, pulse 95, temperature 97.9 F (36.6 C), temperature source Oral, resp. rate 20, height 5\' 4"  (1.626 m), weight 189 lb 1.6 oz (85.8 kg), SpO2 94 %.  General: Pleasant, NAD Psych: Normal affect. Neuro: Alert and oriented X 3. Moves all extremities spontaneously. HEENT: Normal  Neck: Supple without bruits, positive JVD. Lungs:  Resp regular and unlabored, clear in upper lobes, scattered crackles in bases Heart: Irregular rhythm, regular rate, no s3, s4, or murmurs. Abdomen: Soft, non-tender, non-distended, BS + x 4.  Extremities: No clubbing, cyanosis. Trace edema. DP/PT/Radials 1+ and equal bilaterally.  Labs    Troponin [facility] Test)  Recent Labs  02/08/17 1247  TROPIPOC 0.01   No results for input(s): CKTOTAL, CKMB, TROPONINI in the last 72 hours. Lab Results  Component Value Date   WBC 6.3 02/08/2017   HGB 10.3 (L) 02/08/2017   HCT 33.4 (L) 02/08/2017   MCV 87.7 02/08/2017   PLT 239 02/08/2017    Recent Labs Lab 02/10/17 0317  NA 138  K 3.7  CL 97*  CO2 31  BUN 15  CREATININE 1.01*  CALCIUM 9.1  GLUCOSE 116*   Lab Results  Component Value Date   CHOL 207 (H) 12/09/2016   HDL 35 (L) 12/09/2016   LDLCALC 128 (H) 12/09/2016   TRIG 221 (H) 12/09/2016   No results found for: [facility]   Radiology Studies    Dg Chest 2 View  Result Date: 02/09/2017 CLINICAL DATA:  Follow-up acute heart failure. EXAM: CHEST  2 VIEW COMPARISON:  February 08, 2017 FINDINGS: Cardiomegaly. Interstitial prominence suggests mild pulmonary venous congestion/ edema. No other interval changes or acute abnormalities. IMPRESSION: Cardiomegaly and mild pulmonary venous congestion/edema. Electronically Signed   By: Gerome Sam III M.D   On:  02/09/2017 10:29   Dg Chest 2 View  Result Date: 02/08/2017 CLINICAL DATA:  Shortness of breath, history of COPD, hypertension, obesity, atrial fibrillation, former smoker. EXAM: CHEST  2 VIEW COMPARISON:  PA and lateral chest x-ray of September 06, 2016 FINDINGS: The lungs are mildly hyperinflated. The interstitial markings are coarse. The cardiac silhouette is enlarged. The pulmonary vascularity is not engorged. The mediastinum is normal in width. There is calcification in the wall of the aortic arch. The trachea is midline. There is no pleural effusion. The bony thorax exhibits no acute abnormality. IMPRESSION: Chronic bronchitic changes, stable. Stable cardiomegaly without pulmonary vascular congestion. Thoracic aortic atherosclerosis. Electronically Signed   By: David  [place] M.D.   On: 02/08/2017 10:34   Dg Chest Portable 1 View  Result Date: 02/08/2017 CLINICAL DATA:  Shortness of breath and chest pain EXAM: PORTABLE CHEST 1 VIEW COMPARISON:  February 08, 2017 study obtained earlier in the day FINDINGS: Cardiomegaly persists. The pulmonary vascularity is within normal limits. There is no frank edema or consolidation. There is aortic atherosclerosis. No adenopathy. No bone lesions. IMPRESSION: Stable cardiomegaly. No edema or consolidation. There is aortic atherosclerosis. Electronically Signed   By: Bretta Bang III M.D.   On: 02/08/2017 12:55    ECG & Cardiac Imaging  EKG 02/08/17: Afib RVR  Echocardiogram 02/09/17: Study Conclusions - Left ventricle: The cavity size was mildly dilated. Wall   thickness was increased in a pattern of mild LVH. Systolic   function was severely reduced. The estimated ejection fraction   was in the range of 20% to 25%. Diffuse hypokinesis. Doppler   parameters are consistent with abnormal left ventricular   relaxation (grade 1 diastolic dysfunction). - Aortic valve: Valve area (Vmax): 1.4 cm^2. - Aortic root: The aortic root was mildly dilated. - Ascending  aorta: The ascending aorta was mildly dilated. - Mitral valve: Calcified annulus. There was moderate   regurgitation. - Left atrium: The atrium was moderately dilated. - Right ventricle: Systolic function was moderately reduced. - Pulmonary arteries: Systolic pressure was mildly increased. - Pericardium, extracardiac: A trivial pericardial effusion was   identified.  Impressions: - No subcostal views; severe global reduction in LV systolic   function; grade 1 diastolic dysfunction; mildly dilated aortic   root and ascending aorta; moderate MR; moderaet LAE; moderately   reduced RV function; mild TR; mildly elevated pulmonary pressure.   Echo 10/17/14: Study Conclusions - Left ventricle: The cavity size was normal. There was mild concentric hypertrophy. Systolic function was normal. The estimated ejection fraction was in the range of 60% to 65%. Wall motion was normal; there were no regional wall motion abnormalities. Doppler parameters are consistent with abnormal left ventricular relaxation (grade 1 diastolic dysfunction). - Aorta: Aortic root dimension: 44 mm (ED). - Ascending aorta: The ascending aorta was mildly dilated. - Left atrium: The atrium was mildly dilated.   Assessment & Plan    1. Acute on chronic systolic and diastolic heart failure - echo with new LV dysfunction: LVEF 20-25% - BNP on admission 686 - no home lasix regimen - wt on admission was 191 lbs, now 189 lbs, overall net negative 4L, with 2.2 L urine output yesterday - agree with lasix 40 mg BID and potassium replacement - initial troponin on admission negative; however, new set of troponins are cycling given her new onset chest pain and nonspecific T wave changes on EKG  2. COPD - supplemental O2 as needed - no ABX started by primary team - duo-nebs, respiratory therapy per primary team - consider prednisone given her recent taper  3. Atrial fibrillation with RVR - on toprol and coumadin  at home, INR 2.26 - telemetry now with rate controlled flutter vs bouts of sinus rhythme - she denies palpitations on exam - will switch toprol to coreg for heart failure   4. HTN - pressures have been marginal, continue toprol 12.5 for now  5. PFO - following with Dr. 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 EKG with rate controlled fib/flutter, INR therapeutic on coumadin, would consider Takotsubo cardiomyopathy. Will transition toprol to coreg, agree with lasix regimen, add ACEI. Will recommend follow up with Dr. [place] outpatient after medical therapy initiated to discuss right/left heart catheterization.    Signed, Laurie Duster, PA-C 02/10/2017, 1:07 PM [phone]  Patient examined chart reviewed. Elderly white female on home oxygen with recent URI COPD exacerbation. On 1.5-2.0 L oxygen at home. Admitted with worsening dyspnea Mildly elevated BNP in 600 range negative Troponin And no acute ECG changes. Specifically has good R waves throughout Echo with diffuse hypokinesis EF 25% Marked change from echo in 2016 Exam with elderly female no distress Basilar crackles no murmur trace LE Edema. 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 Very unlikely to be ischemic with no real angina, history of CAD,  Negative troponin and no infarct on ECG. Optimize medical Rx coreg and ARB Change to oral diuretic she has had Good diuresis and feels near baseline Outpatient f/u with Dr [place] to discuss possible elective right and left cath In 4-6 weeks after Rx instituted.    Charlton Haws

## 2017-02-11 ENCOUNTER — Inpatient Hospital Stay (HOSPITAL_COMMUNITY): Payer: Medicare Other

## 2017-02-11 LAB — CBC WITH DIFFERENTIAL/PLATELET
Basophils Absolute: 0 10*3/uL (ref 0.0–0.1)
Basophils Relative: 0 %
EOS PCT: 3 %
Eosinophils Absolute: 0.1 10*3/uL (ref 0.0–0.7)
HCT: 32.4 % — ABNORMAL LOW (ref 36.0–46.0)
Hemoglobin: 9.9 g/dL — ABNORMAL LOW (ref 12.0–15.0)
LYMPHS ABS: 1.6 10*3/uL (ref 0.7–4.0)
LYMPHS PCT: 33 %
MCH: 27 pg (ref 26.0–34.0)
MCHC: 30.6 g/dL (ref 30.0–36.0)
MCV: 88.3 fL (ref 78.0–100.0)
MONO ABS: 0.6 10*3/uL (ref 0.1–1.0)
Monocytes Relative: 13 %
Neutro Abs: 2.4 10*3/uL (ref 1.7–7.7)
Neutrophils Relative %: 51 %
PLATELETS: 226 10*3/uL (ref 150–400)
RBC: 3.67 MIL/uL — AB (ref 3.87–5.11)
RDW: 14.6 % (ref 11.5–15.5)
WBC: 4.7 10*3/uL (ref 4.0–10.5)

## 2017-02-11 LAB — BASIC METABOLIC PANEL
Anion gap: 7 (ref 5–15)
BUN: 17 mg/dL (ref 6–20)
CALCIUM: 8.9 mg/dL (ref 8.9–10.3)
CO2: 32 mmol/L (ref 22–32)
CREATININE: 1.2 mg/dL — AB (ref 0.44–1.00)
Chloride: 94 mmol/L — ABNORMAL LOW (ref 101–111)
GFR calc Af Amer: 51 mL/min — ABNORMAL LOW (ref >60)
GFR calc non Af Amer: 44 mL/min — ABNORMAL LOW (ref >60)
GLUCOSE: 120 mg/dL — AB (ref 65–99)
Potassium: 4.1 mmol/L (ref 3.5–5.1)
Sodium: 133 mmol/L — ABNORMAL LOW (ref 135–145)

## 2017-02-11 LAB — MAGNESIUM: Magnesium: 1.8 mg/dL (ref 1.7–2.4)

## 2017-02-11 LAB — PROTIME-INR
INR: 2.35
PROTHROMBIN TIME: 26.2 s — AB (ref 11.4–15.2)

## 2017-02-11 LAB — TROPONIN I: TROPONIN I: 0.03 ng/mL — AB (ref <0.03)

## 2017-02-11 MED ORDER — WARFARIN SODIUM 2.5 MG PO TABS
2.5000 mg | ORAL_TABLET | Freq: Once | ORAL | Status: AC
  Administered 2017-02-11: 2.5 mg via ORAL
  Filled 2017-02-11: qty 1

## 2017-02-11 MED ORDER — CARVEDILOL 3.125 MG PO TABS
3.1250 mg | ORAL_TABLET | Freq: Two times a day (BID) | ORAL | Status: DC
  Administered 2017-02-11 – 2017-02-12 (×2): 3.125 mg via ORAL
  Filled 2017-02-11 (×2): qty 1

## 2017-02-11 MED ORDER — HYDROCODONE-ACETAMINOPHEN 5-325 MG PO TABS
1.0000 | ORAL_TABLET | Freq: Two times a day (BID) | ORAL | Status: DC | PRN
  Administered 2017-02-11 – 2017-02-12 (×3): 1 via ORAL
  Filled 2017-02-11 (×3): qty 1

## 2017-02-11 NOTE — Progress Notes (Signed)
ANTICOAGULATION CONSULT NOTE  Pharmacy Consult for Coumadin Indication: atrial fibrillation   Labs:  Recent Labs  02/08/17 1233 02/09/17 0523 02/10/17 0317 02/10/17 1354 02/10/17 1919 02/11/17 0146  HGB 10.3*  --   --   --   --  9.9*  HCT 33.4*  --   --   --   --  32.4*  PLT 239  --   --   --   --  226  LABPROT 24.8* 21.9* 25.3*  --   --  26.2*  INR 2.20 1.88 2.26  --   --  2.35  CREATININE 0.93 0.99 1.01*  --   --  1.20*  TROPONINI  --   --   --  0.03* 0.03* 0.03*    Estimated Creatinine Clearance: 46.1 mL/min (A) (by C-G formula based on SCr of 1.2 mg/dL (H)).    Assessment: 71yof on Coumadin for afib. INR therapeutic on admission at 2.2.  Home dose: 2.5mg  daily except 5mg  on Monday/Friday - last taken 4/8  INR therapeutic  Goal of Therapy:  INR 2-3 Monitor platelets by anticoagulation protocol: Yes   Plan:  1) Coumadin 2.5 mg today 2) Daily INR  Thank you Anette Guarneri, PharmD [phone] 02/11/2017,8:57 AM

## 2017-02-11 NOTE — Progress Notes (Signed)
Patient is hypotensive upon vitals check, also scheduled for losartan and hydrocodone/apap. pts provider is at bedside, informed of pressure, agreed to hold losartan. Pt advised to wait for hydrocodone as it could have a negative impact on her bp. Pt states she would rather not breathe well than be in pain, pt educated on dangers of low bp and narcotics; pt states "im a dnr".

## 2017-02-11 NOTE — Progress Notes (Signed)
Tech offered Pt a bath. Pt stated that she is not feeling good would will pass on bath for now.

## 2017-02-11 NOTE — Evaluation (Signed)
Physical Therapy Evaluation Patient Details Name: Laurie Horton MRN: [id number] DOB: 1946/06/27 Today's Date: 02/11/2017   History of Present Illness  Pt is a 71 y.o.femaleadmitted with acute on chronic respiratory failure secondary to COPD and heart failure exacerbation. PMH: COPD, atrial fibrillation, fibromyalgia and chronic chest pain, generalized anxiety disorder, hypertension, Stroke, supplemental Oxygen.  Clinical Impression  Pt admitted with above diagnosis, currently with functional limitations due to the deficits listed below (see PT Problem List). Pt able to ambulate 175 ft with 3L 02 and 4 standing rest breaks. SpO2 92-97% during session. Anticipate that the pt will D/C to home following her acute stay. Pt declines any HHPT services. Will continue to follow to maximize mobility, endurance, and safety.      Follow Up Recommendations No PT follow up;Supervision - Intermittent (pt declined HHPT services)    Equipment Recommendations  None recommended by PT    Recommendations for Other Services       Precautions / Restrictions Restrictions Weight Bearing Restrictions: No      Mobility  Bed Mobility               General bed mobility comments: Pt sitting upon arrival and request sitting EOB after session.   Transfers Overall transfer level: Needs assistance Equipment used: None Transfers: Sit to/from Stand Sit to Stand: Supervision         General transfer comment: mild instability initially.   Ambulation/Gait Ambulation/Gait assistance: Supervision Ambulation Distance (Feet): 175 Feet Assistive device: None Gait Pattern/deviations: Step-through pattern;Decreased stride length Gait velocity: decreased   General Gait Details: Pt taking 4 standing breaks related primarily to back pain. O2 sats ranging from 92-97% on 3L.    Stairs            Wheelchair Mobility    Modified Rankin (Stroke Patients Only)       Balance Overall balance  assessment: Needs assistance Sitting-balance support: No upper extremity supported Sitting balance-Leahy Scale: Good     Standing balance support: No upper extremity supported Standing balance-Leahy Scale: Fair                               Pertinent Vitals/Pain Pain Assessment: 0-10 Pain Score: 8  Pain Location: back Pain Descriptors / Indicators: Aching Pain Intervention(s): Limited activity within patient's tolerance;Monitored during session    [place] expects to be discharged to:: Private residence Living Arrangements: Alone Available Help at Discharge: Neighbor;Available PRN/intermittently Type of Home:  (townhouse) Home Access: Stairs to enter Entrance Stairs-Rails: Right Entrance Stairs-Number of Steps: 5 Home Layout: Two level;Bed/bath upstairs;1/2 bath on main level Home Equipment: Cane - single point;Tub bench Additional Comments: Has home O2. States that she sleeps on the couch on the main level. Only goes upstairs occasionally to bathe.     Prior Function Level of Independence: Independent         Comments: uses SPC for longer ambulation in the community     Hand Dominance        Extremity/Trunk Assessment   Upper Extremity Assessment Upper Extremity Assessment: Overall WFL for tasks assessed    Lower Extremity Assessment Lower Extremity Assessment: Overall WFL for tasks assessed       Communication   Communication: No difficulties  Cognition Arousal/Alertness: Awake/alert Behavior During Therapy: WFL for tasks assessed/performed Overall Cognitive Status: Within Functional Limits for tasks assessed  General Comments      Exercises     Assessment/Plan    PT Assessment Patient needs continued PT services  PT Problem List Decreased activity tolerance;Decreased strength;Decreased balance;Decreased mobility       PT Treatment Interventions DME  instruction;Gait training;Stair training;Functional mobility training;Therapeutic activities;Therapeutic exercise;Patient/family education    PT Goals (Current goals can be found in the Care Plan section)  Acute Rehab PT Goals Patient Stated Goal: go back home PT Goal Formulation: With patient Time For Goal Achievement: 02/25/17 Potential to Achieve Goals: Good    Frequency Min 3X/week   Barriers to discharge        Co-evaluation               End of Session Equipment Utilized During Treatment: Gait belt;Oxygen Activity Tolerance: Patient limited by fatigue;Patient limited by pain Patient left: with call bell/phone within reach (sitting EOB per request) Nurse Communication: Mobility status PT Visit Diagnosis: Unsteadiness on feet (R26.81)    Time: 6629-4765 PT Time Calculation (min) (ACUTE ONLY): 28 min   Charges:   PT Evaluation $PT Eval Moderate Complexity: 1 Procedure PT Treatments $Gait Training: 8-22 mins   PT G Codes:        Cassell Clement, PT, CSCS Pager [phone] Office [place] 02/11/2017, 11:23 AM

## 2017-02-11 NOTE — Progress Notes (Signed)
PROGRESS NOTE    Laurie Horton  MEQ:[id number] DOB: [birth year] DOA: 02/08/2017 PCP: [name], MD  Brief Narrative:Laurie Horton is a 71 y.o. female with medical history significant for severe COPD on chronic oxygen with activity and while sleeping since 2006, atrial fibrillation on beta blocker, known PFO, fibromyalgia and chronic chest pain, generalized anxiety disorder, cholesterolemia, hypertension and known grade 1 diastolic dysfunction based on echocardiogram 2015. Patient reported increased palpitations, nonproductive cough and increased dyspnea on exertion, new orthopnea.  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 -repeat ECHO shows 25-30% EF, likely stress-induced per cardiology. No ischemic workup here. -monitor I/Os, Bmet in am    COPD, severe/Chronic resp failure -Home O2 -No active wheezing and moving air quite well bilaterally,  no indication to initiate steroids at this time -Continue home nebs    Essential hypertension -BP improved with diuresis, continue Toprol    PATENT FORAMEN OVALE -Followed by Dr. Martinique in the outpatient setting    Atrial fibrillation with RVR  -HR improved, continue TOprol -Continue Coumadin/pharmacy to manage -CHADVASc = 6    Pure hypercholesterolemia -Continue Crestor    Generalized anxiety disorder/Fibromyalgia and chronic chest pain -Continue Pristiq, Valium prn, Neurontin, Zanaflex, and trazodone   DVT prophylaxis: Warfarin Code Status: DO NOT RESUSCITATE  Family Communication: family at bedside Dispo: home in 1-2days when  adequately diuresed  Subjective: Complains about shortness of breath, concerns about weight gain.  Objective: Vitals:   02/11/17 0606 02/11/17 0825 02/11/17 0929 02/11/17 1201  BP: (!) 99/54 100/66 (!) 89/46 (!) 111/42  Pulse: 71 84 79 87  Resp:      Temp: 98.7 F (37.1 C)     TempSrc: Oral     SpO2: 91%     Weight: 87.6 kg (193 lb 3.2 oz)     Height:        Intake/Output Summary (Last 24 hours) at 02/11/17 1701 Last data filed at 02/11/17 1400  Gross per 24 hour  Intake              780 ml  Output              950 ml  Net             -170 ml   Filed Weights   02/09/17 0542 02/10/17 0500 02/11/17 0606  Weight: 86.4 kg (190 lb 6.4 oz) 85.8 kg (189 lb 1.6 oz) 87.6 kg (193 lb 3.2 oz)    Examination:  General exam: Appears calm and comfortable  Respiratory system: fine crackles at both bases Cardiovascular system: S1 & S2 heard, RRR. + JVD, murmurs,  No pedal edema. Gastrointestinal system: Abdomen is nondistended, soft and nontender.  Normal bowel sounds heard. Central nervous system: Alert and oriented. No focal neurological deficits. Extremities: Symmetric 5 x 5 power. Skin: No rashes, lesions or ulcers Psychiatry: Judgement and insight appear normal. Mood & affect appropriate.     Data Reviewed:   CBC:  Recent Labs Lab 02/08/17 1233 02/11/17 0146  WBC 6.3 4.7  NEUTROABS  --  2.4  HGB 10.3* 9.9*  HCT 33.4* 32.4*  MCV 87.7 88.3  PLT 239 341   Basic Metabolic Panel:  Recent Labs Lab 02/08/17 1233 02/09/17 0523 02/10/17 0317 02/11/17 0146  NA 138 139 138 133*  K 3.8 4.0 3.7 4.1  CL 103 100* 97* 94*  CO2 27 26 31  32  GLUCOSE 137* 112* 116* 120*  BUN 10 9 15 17   CREATININE 0.93 0.99 1.01* 1.20*  CALCIUM 9.2 9.5 9.1 8.9  MG 1.8  --  1.9 1.8   GFR: Estimated Creatinine Clearance: 46.1 mL/min (A) (by C-G formula based on SCr of 1.2 mg/dL (H)). Liver Function Tests: No results for input(s): AST, ALT, ALKPHOS, BILITOT, PROT, ALBUMIN in the last 168  hours. No results for input(s): LIPASE, AMYLASE in the last 168 hours. No results for input(s): AMMONIA in the last 168 hours. Coagulation Profile:  Recent Labs Lab 02/08/17 1233 02/09/17 0523 02/10/17 0317 02/11/17 0146  INR 2.20 1.88 2.26 2.35   Cardiac Enzymes:  Recent Labs Lab 02/10/17 1354 02/10/17 1919 02/11/17 0146  TROPONINI 0.03* 0.03* 0.03*   BNP (last 3 results) No results for input(s): PROBNP in the last 8760 hours. HbA1C: No results for input(s): HGBA1C in the last 72 hours. CBG: No results for input(s): GLUCAP in the last 168 hours. Lipid Profile: No results for input(s): CHOL, HDL, LDLCALC, TRIG, CHOLHDL, LDLDIRECT in the last 72 hours. Thyroid Function Tests: No results for input(s): TSH, T4TOTAL, FREET4, T3FREE, THYROIDAB in the last 72 hours. Anemia Panel: No results for input(s): VITAMINB12, FOLATE, FERRITIN, TIBC, IRON, RETICCTPCT in the last 72 hours. Urine analysis:    Component Value Date/Time   COLORURINE YELLOW 09/06/2016 1401   APPEARANCEUR CLEAR 09/06/2016 1401   LABSPEC 1.011 09/06/2016 1401   PHURINE 8.5 (H) 09/06/2016 1401   GLUCOSEU NEGATIVE 09/06/2016 1401   HGBUR NEGATIVE 09/06/2016 1401   BILIRUBINUR NEGATIVE 09/06/2016 1401   BILIRUBINUR negative 07/31/2016 1400   KETONESUR NEGATIVE 09/06/2016 1401   PROTEINUR NEGATIVE 09/06/2016 1401   UROBILINOGEN 1.0 07/31/2016 1400   UROBILINOGEN 0.2 05/04/2013 1132   NITRITE NEGATIVE 09/06/2016 1401   LEUKOCYTESUR TRACE (A) 09/06/2016 1401   Sepsis Labs: @LABRCNTIP (procalcitonin:4,lacticidven:4)  )No results found for this or any previous visit (from the past 240 hour(s)).       Radiology Studies: Dg Chest 2 View  Result Date: 02/11/2017 CLINICAL DATA:  Congestive heart failure. EXAM: CHEST  2 VIEW COMPARISON:  Radiograph 02/09/2017 FINDINGS: Stable enlarged cardiac silhouette. There is a fine interstitial edema pattern similar to comparison exam. Upper lobe emphysema. No pleural  fluid. Lungs are hyperinflated. IMPRESSION: Cardiomegaly and interstitial edema pattern not changed from prior. Electronically Signed   By: Suzy Bouchard M.D.   On: 02/11/2017 10:53        Scheduled Meds: . carvedilol  3.125 mg Oral BID WC  . desvenlafaxine  50 mg Oral Daily  . furosemide  40 mg Oral BID  . gabapentin  100 mg Oral QID  . losartan  25 mg Oral Daily  . montelukast  10 mg Oral Daily  . oxybutynin  10 mg Oral Daily  . pantoprazole  40 mg Oral q morning - 10a  . [START ON 02/14/2017] rosuvastatin  5 mg Oral Q Sun  . sodium chloride flush  3 mL Intravenous Q12H  . traZODone  100 mg Oral QHS  . warfarin  2.5 mg Oral ONCE-1800  . Warfarin - Pharmacist Dosing Inpatient   Does not apply q1800   Continuous Infusions:   LOS:  3 days    Time spent: 48min  Author:  Berle Mull, MD [facility] Pager: [phone] 02/11/2017 5:01 PM     If 7PM-7AM, please contact night-coverage [email or web address] Password [facility] 02/11/2017, 5:01 PM

## 2017-02-11 NOTE — Progress Notes (Signed)
Pt expresses concern that her room phone is broken, it appeared the ringer on the current phone was not working. Pt's phone was replaced, and tested with successful sound/function.

## 2017-02-11 NOTE — Progress Notes (Addendum)
Progress Note  Patient Name: Laurie Horton Date of Encounter: 02/11/2017  Primary Cardiologist: Dr Martinique  Subjective   Pt has increase shortness of breath today. Her weight is back up. No edema. Has crackles in the right base. She also has lightheadedness with rising to standing.   Inpatient Medications    Scheduled Meds: . carvedilol  6.25 mg Oral BID WC  . desvenlafaxine  50 mg Oral Daily  . furosemide  40 mg Oral BID  . gabapentin  100 mg Oral QID  . HYDROcodone-acetaminophen  1 tablet Oral BID  . losartan  25 mg Oral Daily  . montelukast  10 mg Oral Daily  . oxybutynin  10 mg Oral Daily  . pantoprazole  40 mg Oral q morning - 10a  . [START ON 02/14/2017] rosuvastatin  5 mg Oral Q Sun  . sodium chloride flush  3 mL Intravenous Q12H  . traZODone  100 mg Oral QHS  . Warfarin - Pharmacist Dosing Inpatient   Does not apply q1800   Continuous Infusions:  PRN Meds: sodium chloride, acetaminophen, diazepam, ipratropium, morphine injection, nitroGLYCERIN, ondansetron (ZOFRAN) IV, polyethylene glycol, sodium chloride flush, tiZANidine   Vital Signs    Vitals:   02/10/17 1329 02/10/17 2100 02/11/17 0606 02/11/17 0825  BP: (!) 104/59 (!) 95/53 (!) 99/54 100/66  Pulse: 100 88 71 84  Resp:  18    Temp:  98.2 F (36.8 C) 98.7 F (37.1 C)   TempSrc:  Oral Oral   SpO2:  (!) 86% 91%   Weight:   193 lb 3.2 oz (87.6 kg)   Height:        Intake/Output Summary (Last 24 hours) at 02/11/17 0851 Last data filed at 02/11/17 0800  Gross per 24 hour  Intake             1260 ml  Output             1450 ml  Net             -190 ml   Filed Weights   02/09/17 0542 02/10/17 0500 02/11/17 0606  Weight: 190 lb 6.4 oz (86.4 kg) 189 lb 1.6 oz (85.8 kg) 193 lb 3.2 oz (87.6 kg)    Telemetry    Sinus rhythm with PAC's and PVC's - Personally Reviewed  ECG    No new tracings  Physical Exam   GEN: No acute distress.   Neck: No JVD Cardiac: RRR, no murmurs, rubs, or gallops.    Respiratory: Clear to auscultation bilaterally except few crackles in right base GI: Soft, nontender, non-distended  MS: No edema; No deformity. Neuro:  Nonfocal  Psych: Normal affect   Labs    Chemistry Recent Labs Lab 02/09/17 0523 02/10/17 0317 02/11/17 0146  NA 139 138 133*  K 4.0 3.7 4.1  CL 100* 97* 94*  CO2 26 31 32  GLUCOSE 112* 116* 120*  BUN 9 15 17   CREATININE 0.99 1.01* 1.20*  CALCIUM 9.5 9.1 8.9  GFRNONAA 56* 55* 44*  GFRAA >60 >60 51*  ANIONGAP 13 10 7      Hematology Recent Labs Lab 02/08/17 1233 02/11/17 0146  WBC 6.3 4.7  RBC 3.81* 3.67*  HGB 10.3* 9.9*  HCT 33.4* 32.4*  MCV 87.7 88.3  MCH 27.0 27.0  MCHC 30.8 30.6  RDW 15.3 14.6  PLT 239 226    Cardiac Enzymes Recent Labs Lab 02/10/17 1354 02/10/17 1919 02/11/17 0146  TROPONINI 0.03* 0.03* 0.03*  Recent Labs Lab 02/08/17 1247  TROPIPOC 0.01     BNP Recent Labs Lab 02/08/17 1233  BNP 685.9*     DDimer No results for input(s): DDIMER in the last 168 hours.   Radiology    Dg Chest 2 View  Result Date: 02/09/2017 CLINICAL DATA:  Follow-up acute heart failure. EXAM: CHEST  2 VIEW COMPARISON:  February 08, 2017 FINDINGS: Cardiomegaly. Interstitial prominence suggests mild pulmonary venous congestion/ edema. No other interval changes or acute abnormalities. IMPRESSION: Cardiomegaly and mild pulmonary venous congestion/edema. Electronically Signed   By: Dorise Bullion III M.D   On: 02/09/2017 10:29    Cardiac Studies   EKG 02/08/17: Afib RVR  Echocardiogram 02/09/17: Study Conclusions - Left ventricle: The cavity size was mildly dilated. Wall thickness was increased in a pattern of mild LVH. Systolic function was severely reduced. The estimated ejection fraction was in the range of 20% to 25%. Diffuse hypokinesis. Doppler parameters are consistent with abnormal left ventricular relaxation (grade 1 diastolic dysfunction). - Aortic valve: Valve area (Vmax): 1.4  cm^2. - Aortic root: The aortic root was mildly dilated. - Ascending aorta: The ascending aorta was mildly dilated. - Mitral valve: Calcified annulus. There was moderate regurgitation. - Left atrium: The atrium was moderately dilated. - Right ventricle: Systolic function was moderately reduced. - Pulmonary arteries: Systolic pressure was mildly increased. - Pericardium, extracardiac: A trivial pericardial effusion was identified.  Impressions: - No subcostal views; severe global reduction in LV systolic function; grade 1 diastolic dysfunction; mildly dilated aortic root and ascending aorta; moderate MR; moderaet LAE; moderately reduced RV function; mild TR; mildly elevated pulmonary pressure.  ECHO 10/2014:  EF 60-65%  Patient Profile     71 y.o. female with a PMH significant for grade 1 diastolic dysfunction, patent foramen ovale, HTN, severe COPD, paroxysmal Afib on coumadin, and stroke. Now with new LV dysfunction, EF 20-25%.  Assessment & Plan    1. Acute on chronic systolic and diastolic heart failure - echo with new LV dysfunction: LVEF 20-25%, diffuse hypokinesis, change form 2016 EF 60-65%. 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 Very unlikely to be ischemic with no real angina, history of CAD, Negative troponin and no infarct on ECG. - BNP on admission 686 - no home lasix regimen - wt on admission was 191 lbs, down to 189 lbs and up to 193 today, overall net negative 4.5L, with 1.4L urine output yesterday -On oral lasix, weight increased and pt c/o increased DOE today from yesterday. Repeat CXR and BNP in the morning. -Losartan 25 mg initiated, SBP around 90's-low 100's -Metoprolol has been switched to carvedilol 6.25 mg -Plan for follow up outpatient with Dr. Martinique to discuss possible elective right and left cath In 4-6 weeks after Rx instituted.  Appt has been made for 4/25  2. COPD - On home O2 - no ABX started by primary team - duo-nebs, respiratory  therapy per primary team  3. Atrial fibrillation with RVR - on carvedilol and coumadin at home, INR 2.26. CHADVASc = 4 (CHF, HTN, age, female) -Monitor shows sinus rhythm with frequent PACs and PVCs - she denies palpitations on exam - will switch toprol to coreg for heart failure   4. HTN - pressures have been marginal, ARB has been initiated and metoprolol switched to carveilol -Having lightheadedness upon standing. Monitor BP and safety precautions.  -BP lower on diuresis.   5. PFO - following with Dr. Martinique  Signed, Daune Perch, NP  02/11/2017, 8:51 AM  Patient examined chart reviewed. Some pleuritic chest and epigastric pain this am Basilar crackles On beta blocker less MAT/PAF NSR this am rates in 80's still Repeat CXR/BNP in am Triple Rx for DCM started yesterday with plans for outpatient Cath in 4-6 weeks  Jenkins Rouge

## 2017-02-12 LAB — BASIC METABOLIC PANEL
ANION GAP: 8 (ref 5–15)
BUN: 17 mg/dL (ref 6–20)
CHLORIDE: 93 mmol/L — AB (ref 101–111)
CO2: 34 mmol/L — ABNORMAL HIGH (ref 22–32)
Calcium: 8.9 mg/dL (ref 8.9–10.3)
Creatinine, Ser: 0.99 mg/dL (ref 0.44–1.00)
GFR calc non Af Amer: 56 mL/min — ABNORMAL LOW (ref >60)
Glucose, Bld: 102 mg/dL — ABNORMAL HIGH (ref 65–99)
POTASSIUM: 4 mmol/L (ref 3.5–5.1)
SODIUM: 135 mmol/L (ref 135–145)

## 2017-02-12 LAB — PROTIME-INR
INR: 2.13
Prothrombin Time: 24.2 seconds — ABNORMAL HIGH (ref 11.4–15.2)

## 2017-02-12 LAB — CBC
HEMATOCRIT: 31.9 % — AB (ref 36.0–46.0)
HEMOGLOBIN: 9.8 g/dL — AB (ref 12.0–15.0)
MCH: 27 pg (ref 26.0–34.0)
MCHC: 30.7 g/dL (ref 30.0–36.0)
MCV: 87.9 fL (ref 78.0–100.0)
Platelets: 224 10*3/uL (ref 150–400)
RBC: 3.63 MIL/uL — ABNORMAL LOW (ref 3.87–5.11)
RDW: 14.7 % (ref 11.5–15.5)
WBC: 4.3 10*3/uL (ref 4.0–10.5)

## 2017-02-12 LAB — BRAIN NATRIURETIC PEPTIDE: B NATRIURETIC PEPTIDE 5: 523.2 pg/mL — AB (ref 0.0–100.0)

## 2017-02-12 MED ORDER — LOSARTAN POTASSIUM 25 MG PO TABS: 25.0000 mg | ORAL_TABLET | Freq: Every day | ORAL | 0 refills | Status: DC

## 2017-02-12 MED ORDER — CARVEDILOL 3.125 MG PO TABS: 3.1250 mg | ORAL_TABLET | Freq: Two times a day (BID) | ORAL | 0 refills | Status: DC

## 2017-02-12 MED ORDER — FUROSEMIDE 40 MG PO TABS: 40.0000 mg | ORAL_TABLET | Freq: Two times a day (BID) | ORAL | 0 refills | Status: DC

## 2017-02-12 MED ORDER — WARFARIN SODIUM 2.5 MG PO TABS: 2.5000 mg | ORAL_TABLET | ORAL | Status: DC

## 2017-02-12 MED ORDER — WARFARIN SODIUM 5 MG PO TABS: 5.0000 mg | ORAL_TABLET | ORAL | Status: DC

## 2017-02-12 NOTE — Progress Notes (Signed)
CM talked to [facility] with [place], she is now refusing [place] services due to the $20 co pay; Aneta Mins [phone]

## 2017-02-12 NOTE — Progress Notes (Signed)
Progress Note  Patient Name: Laurie Horton Date of Encounter: 02/12/2017  Primary Cardiologist: Dr Martinique  Subjective   ? Constipation abdomen full wants to go home   Inpatient Medications    Scheduled Meds: . carvedilol  3.125 mg Oral BID WC  . desvenlafaxine  50 mg Oral Daily  . furosemide  40 mg Oral BID  . gabapentin  100 mg Oral QID  . losartan  25 mg Oral Daily  . montelukast  10 mg Oral Daily  . oxybutynin  10 mg Oral Daily  . pantoprazole  40 mg Oral q morning - 10a  . [START ON 02/14/2017] rosuvastatin  5 mg Oral Q Sun  . sodium chloride flush  3 mL Intravenous Q12H  . traZODone  100 mg Oral QHS  . Warfarin - Pharmacist Dosing Inpatient   Does not apply q1800   Continuous Infusions:  PRN Meds: sodium chloride, acetaminophen, diazepam, HYDROcodone-acetaminophen, ipratropium, nitroGLYCERIN, ondansetron (ZOFRAN) IV, polyethylene glycol, sodium chloride flush, tiZANidine   Vital Signs    Vitals:   02/11/17 1943 02/11/17 2020 02/12/17 0615 02/12/17 0805  BP: (!) 104/55  (!) 104/59 112/65  Pulse: 81  78 83  Resp: 18  18   Temp: 97.8 F (36.6 C)  98 F (36.7 C) 98.4 F (36.9 C)  TempSrc: Oral  Oral Oral  SpO2: (!) 84% 92% 90% 94%  Weight:   193 lb 8 oz (87.8 kg)   Height:        Intake/Output Summary (Last 24 hours) at 02/12/17 0840 Last data filed at 02/12/17 6440  Gross per 24 hour  Intake              720 ml  Output             1700 ml  Net             -980 ml   Filed Weights   02/10/17 0500 02/11/17 0606 02/12/17 0615  Weight: 189 lb 1.6 oz (85.8 kg) 193 lb 3.2 oz (87.6 kg) 193 lb 8 oz (87.8 kg)    Telemetry    Sinus rhythm with PAC's and PVC's - Personally Reviewed  ECG    No new tracings  Physical Exam   GEN: No acute distress.   Neck: No JVD Cardiac: RRR, no murmurs, rubs, or gallops.  Respiratory: Clear to auscultation bilaterally except few crackles in right base GI: Soft, nontender, non-distended  MS: No edema; No  deformity. Neuro:  Nonfocal  Psych: Normal affect   Labs    Chemistry  Recent Labs Lab 02/10/17 0317 02/11/17 0146 02/12/17 0331  NA 138 133* 135  K 3.7 4.1 4.0  CL 97* 94* 93*  CO2 31 32 34*  GLUCOSE 116* 120* 102*  BUN 15 17 17   CREATININE 1.01* 1.20* 0.99  CALCIUM 9.1 8.9 8.9  GFRNONAA 55* 44* 56*  GFRAA >60 51* >60  ANIONGAP 10 7 8      Hematology  Recent Labs Lab 02/08/17 1233 02/11/17 0146 02/12/17 0331  WBC 6.3 4.7 4.3  RBC 3.81* 3.67* 3.63*  HGB 10.3* 9.9* 9.8*  HCT 33.4* 32.4* 31.9*  MCV 87.7 88.3 87.9  MCH 27.0 27.0 27.0  MCHC 30.8 30.6 30.7  RDW 15.3 14.6 14.7  PLT 239 226 224    Cardiac Enzymes  Recent Labs Lab 02/10/17 1354 02/10/17 1919 02/11/17 0146  TROPONINI 0.03* 0.03* 0.03*     Recent Labs Lab 02/08/17 1247  TROPIPOC 0.01  BNP  Recent Labs Lab 02/08/17 1233 02/12/17 0331  BNP 685.9* 523.2*     DDimer No results for input(s): DDIMER in the last 168 hours.   Radiology    Dg Chest 2 View  Result Date: 02/11/2017 CLINICAL DATA:  Congestive heart failure. EXAM: CHEST  2 VIEW COMPARISON:  Radiograph 02/09/2017 FINDINGS: Stable enlarged cardiac silhouette. There is a fine interstitial edema pattern similar to comparison exam. Upper lobe emphysema. No pleural fluid. Lungs are hyperinflated. IMPRESSION: Cardiomegaly and interstitial edema pattern not changed from prior. Electronically Signed   By: Suzy Bouchard M.D.   On: 02/11/2017 10:53    Cardiac Studies   EKG 02/08/17: Afib RVR  Echocardiogram 02/09/17: Study Conclusions - Left ventricle: The cavity size was mildly dilated. Wall thickness was increased in a pattern of mild LVH. Systolic function was severely reduced. The estimated ejection fraction was in the range of 20% to 25%. Diffuse hypokinesis. Doppler parameters are consistent with abnormal left ventricular relaxation (grade 1 diastolic dysfunction). - Aortic valve: Valve area (Vmax): 1.4  cm^2. - Aortic root: The aortic root was mildly dilated. - Ascending aorta: The ascending aorta was mildly dilated. - Mitral valve: Calcified annulus. There was moderate regurgitation. - Left atrium: The atrium was moderately dilated. - Right ventricle: Systolic function was moderately reduced. - Pulmonary arteries: Systolic pressure was mildly increased. - Pericardium, extracardiac: A trivial pericardial effusion was identified.  Impressions: - No subcostal views; severe global reduction in LV systolic function; grade 1 diastolic dysfunction; mildly dilated aortic root and ascending aorta; moderate MR; moderaet LAE; moderately reduced RV function; mild TR; mildly elevated pulmonary pressure.  ECHO 10/2014:  EF 60-65%  Patient Profile     71 y.o. female with a PMH significant for grade 1 diastolic dysfunction, patent foramen ovale, HTN, severe COPD, paroxysmal Afib on coumadin, and stroke. Now with new LV dysfunction, EF 20-25%.  Assessment & Plan    1. Acute on chronic systolic and diastolic heart failure - echo with new LV dysfunction: LVEF 20-25%, diffuse hypokinesis, change form 2016 EF 60-65%. 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 Very unlikely to be ischemic with no real angina, history of CAD, Negative troponin and no infarct on ECG. - BNP on admission 686 slight improvement 500 range yesterday  I/O negative about a liter doubt weight is accurate continue bid lasix  -Losartan 25 mg initiated, SBP around 90's-low 100's -Metoprolol has been switched to carvedilol 6.25 mg -Plan for follow up outpatient with Dr. Martinique to discuss possible elective right and left cath In 4-6 weeks after Rx instituted.  Appt has been made for 4/25  2. COPD - On home O2 - no ABX started by primary team - duo-nebs, respiratory therapy per primary team  3. Atrial fibrillation with RVR - on carvedilol and coumadin at home, INR 2.26. CHADVASc = 4 (CHF, HTN, age, female) -Monitor  shows sinus rhythm this am  - she denies palpitations on exam  4. HTN - pressures have been marginal, ARB has been initiated and metoprolol switched to carveilol -Having lightheadedness upon standing. Monitor BP and safety precautions.  -BP lower on diuresis.   5. PFO - following with Dr. Martinique  Ok to d/c and we will arrange outpatient f/u Martinique and elective right left cath in 4-6 weeks After being on Rx   [facility]

## 2017-02-12 NOTE — Care Management Important Message (Signed)
Important Message  Patient Details  Name: Laurie Horton MRN: [id number] Date of Birth: Sep 03, 1946   Medicare Important Message Given:  Yes    Orbie Pyo 02/12/2017, 2:10 PM

## 2017-02-12 NOTE — Progress Notes (Signed)
ANTICOAGULATION CONSULT NOTE  Pharmacy Consult for Coumadin Indication: atrial fibrillation   Labs:  Recent Labs  02/10/17 0317 02/10/17 1354 02/10/17 1919 02/11/17 0146 02/12/17 0331  HGB  --   --   --  9.9* 9.8*  HCT  --   --   --  32.4* 31.9*  PLT  --   --   --  226 224  LABPROT 25.3*  --   --  26.2* 24.2*  INR 2.26  --   --  2.35 2.13  CREATININE 1.01*  --   --  1.20* 0.99  TROPONINI  --  0.03* 0.03* 0.03*  --     Estimated Creatinine Clearance: 55.9 mL/min (by C-G formula based on SCr of 0.99 mg/dL).  Assessment: 71yof on Coumadin for afib. INR therapeutic on admission at 2.2.  Home dose: 2.5mg  daily except 5mg  on Monday/Friday - last taken 4/8  INR therapeutic  Goal of Therapy:  INR 2-3 Monitor platelets by anticoagulation protocol: Yes   Plan:  Resume home dose Daily INR for now  Thank you Anette Guarneri, PharmD [phone] 02/12/2017,9:20 AM

## 2017-02-12 NOTE — Progress Notes (Signed)
Pt slept well during the night, Vitals stable, no any sign of SOB and distress noted, no any complain of pain, will continue to monitor the patient. 

## 2017-02-12 NOTE — Progress Notes (Signed)
[facility] at bedside. IV out and going to go over Discharge information. Pt has declined home health.

## 2017-02-12 NOTE — Telephone Encounter (Signed)
TCM - patient still admitted on 02/12/17

## 2017-02-12 NOTE — Care Management Note (Signed)
Case Management Note  Patient Details  Name: Laurie Horton MRN: [id number] Date of Birth: 1945/12/24  Subjective/Objective:    Admitted with Acute on Chronic Resp Failure               Action/Plan: CM talked to patient with her daughter at the bedside; She lives alone with her dog; PCP: [name], MD; has private insurance with [facility]; home oxygen through [facility]; [place] choice offered, pt chose [place]; Butch Penny with [facility] called for arrangements.  Expected Discharge Date:  02/12/17               Expected Discharge Plan:  [place]  Discharge planning Services  CM Consult  Choice offered to:  Patient, Adult Children  HH Arranged:  PT [place] Agency:  [place]  Status of Service:  In process, will continue to follow  Sherrilyn Rist [phone] 02/12/2017, 10:33 AM

## 2017-02-15 NOTE — Telephone Encounter (Signed)
Patient contacted regarding discharge from [place] on 02/12/17.  Patient understands to follow up with provider STRADER on 02/24/17 at10:30  At [facility]. Patient understands discharge instructions? yes  Patient understands medications and regiment? yes  Patient understands to bring all medications to this visit? yes

## 2017-02-17 NOTE — Discharge Summary (Signed)
[facility] Discharge Summary   Patient: Laurie Horton OEV:[id number]   PCP: [name], MD DOB: 30-Jul-1946   Date of admission: 02/08/2017   Date of discharge: 02/12/2017    Discharge Diagnoses:  Principal Problem:   Acute on chronic respiratory failure with hypoxia ([place]) Active Problems:   Essential hypertension   PATENT FORAMEN OVALE   Pure hypercholesterolemia   Generalized anxiety disorder   COPD, severe (HCC)   Acute diastolic heart failure, NYHA class 1 (HCC)   Fibromyalgia and chronic chest pain   Atrial fibrillation with RVR ([place])   Acute heart failure ([place])   Admitted From: home Disposition:  home  Recommendations for Outpatient Follow-up:  1. Please follow up with cardiology as recommended   Follow-up [place], PA-C Follow up on 02/24/2017.   Specialties:  Physician Assistant, Cardiology Why:  10:15AM for [facility] appt and hospital follow up Contact information: [place] [address] [place] Follow up.   Why:  They will do all of your home health physical therapy at your home Contact information: [address] [place] [address] [phone]          Diet recommendation: cardiac diet  Activity: The patient is advised to gradually reintroduce usual activities.  Discharge Condition: good  Code Status: DNR DNI  History of present illness: As per the H and P dictated on admission, "Laurie Horton is a 71 y.o. female with medical history significant for severe COPD on chronic oxygen with activity and while sleeping since 2006, atrial fibrillation on beta blocker, known PFO, fibromyalgia and chronic chest pain, generalized anxiety disorder, cholesterolemia, hypertension and known grade 1 diastolic dysfunction based on echocardiogram 2015. Patient reports over the past week she has noticed increased palpitations, nonproductive cough and increased  dyspnea on exertion. She's also noticed new orthopnea. She is not had any lower extremity edema and does not have a scale so was unable to weigh herself at home. She initially presented to the [facility] earlier today and due to concerns of possible cardiac ischemia given her report of chest pain she was sent to the ER for further evaluation."  Hospital Course:  Summary of her active problems in the hospital is as following. 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 -repeat ECHO shows 25-30% EF, likely stress-induced per cardiology. No ischemic workup here.  COPD, severe/Chronic resp failure -Home O2 -No active wheezing and moving air quite well bilaterally,  noindication to initiate steroids at this time -Continue home nebs  Essential hypertension -BP improved with diuresis, continue Toprol  PATENT FORAMEN OVALE -Followed byDr. Martinique in the outpatient setting  Atrial fibrillation with RVR  -HR improved, continue TOprol -Continue Coumadin/pharmacy to manage -CHADVASc = 6  Pure hypercholesterolemia -Continue Crestor  Generalized anxiety disorder/Fibromyalgia and chronic chest pain -Continue Pristiq, Valium prn, Neurontin, Zanaflex, and trazodone  All other chronic medical condition were stable during the hospitalization.  Patient was seen by physical therapy, who recommended no follow up, which was arranged by Education officer, museum and case Freight forwarder. On  the day of the discharge the patient's vitals were stable, and no other acute medical condition were reported by patient. the patient was felt  safe to be discharge at home with family.  Procedures and Results:  Echocardiogram   Study Conclusions  - Left ventricle: The cavity size was mildly dilated. Wall   thickness was increased in a pattern of mild LVH. Systolic   function was severely reduced. The estimated ejection fraction   was in the range of 20% to 25%. Diffuse hypokinesis. Doppler   parameters are consistent with abnormal left ventricular   relaxation (grade 1 diastolic dysfunction). - Aortic valve: Valve area (Vmax): 1.4 cm^2. - Aortic root: The aortic root was mildly dilated. - Ascending aorta: The ascending aorta was mildly dilated. - Mitral valve: Calcified annulus. There was moderate   regurgitation. - Left atrium: The atrium was moderately dilated. - Right ventricle: Systolic function was moderately reduced. - Pulmonary arteries: Systolic pressure was mildly increased. - Pericardium, extracardiac: A trivial pericardial effusion was   identified.  Consultations:  Cardiology  DISCHARGE MEDICATION: Discharge Medication List as of 02/12/2017 11:30 AM    START taking these medications   Details  carvedilol (COREG) 3.125 MG tablet Take 1 tablet (3.125 mg total) by mouth 2 (two) times daily with a meal., Starting Fri 02/12/2017, Normal    furosemide (LASIX) 40 MG tablet Take 1 tablet (40 mg total) by mouth 2 (two) times daily., Starting Fri 02/12/2017, Normal    losartan (COZAAR) 25 MG tablet Take 1 tablet (25 mg total) by mouth daily., Starting Sat 02/13/2017, Normal      CONTINUE these medications which have NOT CHANGED   Details  desvenlafaxine (PRISTIQ) 50 MG 24 hr tablet Take 1 tablet (50 mg total) by mouth daily., Starting Wed 01/13/2017, Normal    diazepam (VALIUM) 2 MG tablet Take 2 mg by mouth every 6 (six) hours as needed for anxiety., Historical Med    gabapentin (NEURONTIN) 100 MG capsule Take 100 mg by mouth 4 (four) times daily. , Starting Wed 09/16/2016, Historical Med      HYDROcodone-acetaminophen (NORCO/VICODIN) 5-325 MG tablet Take 1 tablet by mouth 2 (two) times daily. , Historical Med    ipratropium (ATROVENT) 0.02 % nebulizer solution USE 1 VIAL BY NEBULIZATION 4 (FOUR) TIMES DAILY., Normal    montelukast (SINGULAIR) 10 MG tablet TAKE 1 TABLET BY MOUTH AT BEDTIME, Normal    ondansetron (ZOFRAN ODT) 8 MG disintegrating tablet Take 1 tablet (8 mg total) by mouth every 8 (eight) hours as needed for nausea or vomiting., Starting Tue 09/01/2016, Print    oxybutynin (DITROPAN-XL) 10 MG 24 hr tablet Take 10 mg by mouth at bedtime. , Starting Tue 08/11/2016, Historical Med    pantoprazole (PROTONIX) 40 MG tablet Take 1 tablet by mouth every morning. , Starting 07/24/2013, Until Discontinued, Historical Med    polyethylene glycol (MIRALAX / GLYCOLAX) packet Take 17 g by mouth daily as needed for mild constipation. , Until Discontinued, Historical Med    rosuvastatin (CRESTOR) 5 MG tablet Take 5 mg by mouth once a week. Sunday, Starting Thu 07/30/2016, Historical Med    tiZANidine (ZANAFLEX) 2 MG tablet Take by mouth every 6 (six) hours as needed for muscle spasms., Historical Med    traZODone (DESYREL) 100 MG tablet Take 1 tablet (100 mg total) by mouth at bedtime., Starting Wed 01/13/2017, Normal    triamcinolone ointment (KENALOG) 0.1 % Apply 1 application topically daily as needed for skin breakdown., Starting Wed 02/03/2017,  Historical Med    warfarin (COUMADIN) 5 MG tablet TAKE 1/2 TO 1 TABLET BY MOUTH EVERY DAY OR AS DIRECTED BY COUMADIN CLINIC (FILLABLE 09/02/16), Normal      STOP taking these medications     clarithromycin (BIAXIN) 500 MG tablet      metoprolol succinate (TOPROL-XL) 25 MG 24 hr tablet      OXYGEN      predniSONE (DELTASONE) 10 MG tablet        Allergies  Allergen Reactions  . Alprazolam     REACTION: stops breathing  . Iodine Swelling    REACTION: swelling in throat  . Pseudoephedrine Hcl Er Shortness Of Breath  .  Budesonide-Formoterol Fumarate     bliusters  . Crestor [Rosuvastatin Calcium] Other (See Comments)    Unable to walk  . Esomeprazole Magnesium Other (See Comments)    REACTION: "bouncing off walls"  . Flonase [Fluticasone Propionate] Other (See Comments)    unspecified  . Loratadine Other (See Comments)    claritin D causes shaking  . Lotrimin [Clotrimazole] Other (See Comments)    blisters  . Lunesta [Eszopiclone] Other (See Comments)    REACTION: "slept for a week"  . Oxcarbazepine Other (See Comments)    Causes deep sleep and dizziness  . Statins Other (See Comments)    unspecified  . Zolpidem Tartrate Other (See Comments)    REACTION: "slept for a week"  . Aciphex [Rabeprazole Sodium] Rash  . Avelox [Moxifloxacin Hcl In Nacl] Other (See Comments)    Stomach cramps.   Darlin Coco [Valdecoxib] Rash  . Ceclor [Cefaclor] Rash  . Cephalexin Rash    Pt states that she is possibly allergic to this - had a reaction to Cefaclor in the past and she does not want to these class drugs. Added per patient request.  . Covera-Hs [Verapamil Hcl] Palpitations  . Dicyclomine Hcl Rash  . Other Other (See Comments)    Glue from ekg/heart monitor leads --rash +  Any MYCINS  . Tessalon Perles Rash   Discharge Instructions    Diet - low sodium heart healthy    Complete by:  As directed    Discharge instructions    Complete by:  As directed    It is important that you read following instructions as well as go over your medication list with RN to help you understand your care after this hospitalization.  Discharge Instructions: Please follow-up with PCP in one week  Please request your primary care physician to go over all Hospital Tests and Procedure/Radiological results at the follow up,  Please get all Hospital records sent to your PCP by signing hospital release before you go home.   Do not drive, operating heavy machinery, perform activities at heights, swimming or participation in water  Do not take more than prescribed Pain, Sleep and Anxiety Medications. You were cared for by a hospitalist during your hospital stay. If you have any questions about your discharge medications or the care you received while you were in the hospital after you are discharged, you can call the unit and ask to speak with the hospitalist on call if the hospitalist that took care of you is not available.  Once you are discharged, your primary care physician will handle any further medical issues. Please note that NO REFILLS for any discharge medications will be authorized once you are discharged, as it is imperative that you return to your primary care physician (or establish a relationship with a primary care  physician if you do not have one) for your aftercare needs so that they can reassess your need for medications and monitor your lab values. You Must read complete instructions/literature along with all the possible adverse reactions/side effects for all the Medicines you take and that have been prescribed to you. Take any new Medicines after you have completely understood and accept all the possible adverse reactions/side effects. Wear Seat belts while driving.   Increase activity slowly    Complete by:  As directed      Discharge Exam: Filed Weights   02/10/17 0500 02/11/17 0606 02/12/17 0615  Weight: 85.8 kg (189 lb 1.6 oz) 87.6 kg (193 lb 3.2 oz) 87.8 kg (193 lb 8 oz)   Vitals:   02/12/17 0615 02/12/17 0805  BP: (!) 104/59 112/65  Pulse: 78 83  Resp: 18   Temp: 98 F (36.7 C) 98.4 F (36.9 C)   General: Appear in no distress, no Rash; Oral Mucosa moist. Cardiovascular: S1 and S2 Present, no Murmur, no JVD Respiratory: Bilateral Air entry present and Clear to Auscultation, no Crackles, no wheezes Abdomen: Bowel Sound present, Soft and no tenderness Extremities: no Pedal edema, no calf tenderness Neurology: Grossly no focal neuro deficit.  The results of significant diagnostics from  this hospitalization (including imaging, microbiology, ancillary and laboratory) are listed below for reference.    Significant Diagnostic Studies: Dg Chest 2 View  Result Date: 02/11/2017 CLINICAL DATA:  Congestive heart failure. EXAM: CHEST  2 VIEW COMPARISON:  Radiograph 02/09/2017 FINDINGS: Stable enlarged cardiac silhouette. There is a fine interstitial edema pattern similar to comparison exam. Upper lobe emphysema. No pleural fluid. Lungs are hyperinflated. IMPRESSION: Cardiomegaly and interstitial edema pattern not changed from prior. Electronically Signed   By: Suzy Bouchard M.D.   On: 02/11/2017 10:53   Dg Chest 2 View  Result Date: 02/09/2017 CLINICAL DATA:  Follow-up acute heart failure. EXAM: CHEST  2 VIEW COMPARISON:  February 08, 2017 FINDINGS: Cardiomegaly. Interstitial prominence suggests mild pulmonary venous congestion/ edema. No other interval changes or acute abnormalities. IMPRESSION: Cardiomegaly and mild pulmonary venous congestion/edema. Electronically Signed   By: Dorise Bullion III M.D   On: 02/09/2017 10:29   Dg Chest 2 View  Result Date: 02/08/2017 CLINICAL DATA:  Shortness of breath, history of COPD, hypertension, obesity, atrial fibrillation, former smoker. EXAM: CHEST  2 VIEW COMPARISON:  PA and lateral chest x-ray of September 06, 2016 FINDINGS: The lungs are mildly hyperinflated. The interstitial markings are coarse. The cardiac silhouette is enlarged. The pulmonary vascularity is not engorged. The mediastinum is normal in width. There is calcification in the wall of the aortic arch. The trachea is midline. There is no pleural effusion. The bony thorax exhibits no acute abnormality. IMPRESSION: Chronic bronchitic changes, stable. Stable cardiomegaly without pulmonary vascular congestion. Thoracic aortic atherosclerosis. Electronically Signed   By: David  Martinique M.D.   On: 02/08/2017 10:34   Dg Chest Portable 1 View  Result Date: 02/08/2017 CLINICAL DATA:  Shortness of  breath and chest pain EXAM: PORTABLE CHEST 1 VIEW COMPARISON:  February 08, 2017 study obtained earlier in the day FINDINGS: Cardiomegaly persists. The pulmonary vascularity is within normal limits. There is no frank edema or consolidation. There is aortic atherosclerosis. No adenopathy. No bone lesions. IMPRESSION: Stable cardiomegaly. No edema or consolidation. There is aortic atherosclerosis. Electronically Signed   By: Lowella Grip III M.D.   On: 02/08/2017 12:55    Microbiology: No results found for this or any previous visit (  from the past 240 hour(s)).   Labs: CBC:  Recent Labs Lab 02/11/17 0146 02/12/17 0331  WBC 4.7 4.3  NEUTROABS 2.4  --   HGB 9.9* 9.8*  HCT 32.4* 31.9*  MCV 88.3 87.9  PLT 226 838   Basic Metabolic Panel:  Recent Labs Lab 02/11/17 0146 02/12/17 0331  NA 133* 135  K 4.1 4.0  CL 94* 93*  CO2 32 34*  GLUCOSE 120* 102*  BUN 17 17  CREATININE 1.20* 0.99  CALCIUM 8.9 8.9  MG 1.8  --    Liver Function Tests: No results for input(s): AST, ALT, ALKPHOS, BILITOT, PROT, ALBUMIN in the last 168 hours. No results for input(s): LIPASE, AMYLASE in the last 168 hours. No results for input(s): AMMONIA in the last 168 hours. Cardiac Enzymes:  Recent Labs Lab 02/11/17 0146  TROPONINI 0.03*   BNP (last 3 results)  Recent Labs  02/08/17 1233 02/12/17 0331  BNP 685.9* 523.2*   CBG: No results for input(s): GLUCAP in the last 168 hours. Time spent: 35 minutes  Signed:  Berle Mull  [facility] 02/12/2017, 10:33 PM

## 2017-02-18 ENCOUNTER — Other Ambulatory Visit: Payer: Self-pay | Admitting: Emergency Medicine

## 2017-02-19 ENCOUNTER — Telehealth: Payer: Self-pay | Admitting: Family Medicine

## 2017-02-19 MED ORDER — MONTELUKAST SODIUM 10 MG PO TABS: 10.0000 mg | ORAL_TABLET | Freq: Every day | ORAL | 0 refills | Status: DC

## 2017-02-19 NOTE — Telephone Encounter (Signed)
Pt called to check up on her montelukast prescription.  [facility] had sent over a request a few days ago and she is on her last pill.  Please advise  [phone]

## 2017-02-22 NOTE — Progress Notes (Signed)
Cardiology Office Note    Date:  02/24/2017   ID:  Laurie, Horton Jun 11, 1946, MRN [id number]  PCP:  [name], MD  Cardiologist: Dr. Martinique   Chief Complaint  Patient presents with  . Atrial Fibrillation  . Congestive Heart Failure  . Shortness of Breath    dizziness    History of Present Illness:    Laurie Horton is a 71 y.o. female with past medical history of chronic diastolic CHF, PFO, PAF (on Coumadin), COPD (on 2L [place] at baseline), HTN, and prior CVA who presents to the office today for hospital follow-up.   She was recently admitted from 4/9 - 02/12/2017 for worsening dyspnea on exertion and palpitations. Was in atrial fibrillation with RVR upon arrival to the ED. Echo during admission showed a newly reduced EF of 20-25% and she was diuresed with IV Lasix. Was followed by Cardiology and it was thought her cardiomyopathy was possibly of a Tak[place]o etiology as she denied any recent anginal symptoms with enzymes being negative and EKG showing no acute ischemic changes. It was recommended to consider a right/left heart cath in 4-6 weeks. Weight was 193 lbs at time of discharge and she was continued on Lasix 40mg  BID along with Coreg 3.125mg  BID and Losartan 25mg  daily.  The patient reports today that since her recent hospitalization, her breathing has improved. She has baseline dyspnea on exertion and uses 2L [place] at baseline but denies any acute worsening in her symptoms. No orthopnea, PND or lower extremity edema. She reports good compliance with her medication regimen and has been keeping a very detailed log of her sodium and fluid intake. She has noted multiple times that her blood pressure has been soft with systolic readings in the 26'O. Has associated dizziness when her blood pressure runs this low. Repeat readings later in the day had improved into the low-100's.   She has noted episodes of chest pain and palpitations, which can occur at rest or with exertion. She  asks today if her cardiac catheterization can be later this week as she wants answers as to why her EF is reduced.    Past Medical History:  Diagnosis Date  . Complication of anesthesia    various issues with oxygen  saturations post op  . COPD (chronic obstructive pulmonary disease) ([place])   . Depression   . Diverticulitis   . DVT (deep venous thrombosis) ([place])   . Fibromyalgia   . GERD (gastroesophageal reflux disease)   . Hiatal hernia   . History of deviated nasal septum    left- side  . HTN (hypertension)   . Hyperlipidemia   . Obesity   . On supplemental oxygen therapy    concentrator at night @ 1.5 l/m or when sleeps. O2 Sat niormally 87.  . Osteoporosis   . PAT (paroxysmal atrial tachycardia) ([place])   . PFO (patent foramen ovale)   . PULMONARY NODULE, LEFT LOWER LOBE 10/14/2009   68mm LLL nodule dec 2010. Stable and 36mm in Oct 2012. No further fu  . Right middle lobe pneumonia ([place]) 07/24/2011   First noted at admit 07/10/11. Persists on cxr 07/22/11. Cleared on CT 08/24/11. No further followup  . Stroke ([place])   . TOBACCO ABUSE 06/04/2009    Past Surgical History:  Procedure Laterality Date  . CARDIOVASCULAR STRESS TEST  12/26/2004   EF 74%. NO EVIDENCE OF ISCHEMIA  . ESOPHAGOGASTRODUODENOSCOPY (EGD) WITH PROPOFOL N/A 04/15/2015   Procedure: ESOPHAGOGASTRODUODENOSCOPY (EGD) WITH PROPOFOL;  Surgeon:  Laurence Spates, MD;  Location: [facility];  Service: Endoscopy;  Laterality: N/A;  . KNEE ARTHROSCOPY  2000   left  . LAPAROSCOPIC CHOLECYSTECTOMY  04-16-2010   [facility]  . MOUTH SURGERY     03-26-15 multiple extractions stitches remains  . TOTAL ABDOMINAL HYSTERECTOMY     post op needed oxygen was told "she gave them a scare"  . TUBAL LIGATION    . US ECHOCARDIOGRAPHY  11/20/2009   EF 55-60%    Current Medications: Outpatient Medications Prior to Visit  Medication Sig Dispense Refill  . carvedilol (COREG) 3.125 MG tablet Take 1 tablet (3.125 mg total) by mouth 2 (two) times  daily with a meal. 60 tablet 0  . desvenlafaxine (PRISTIQ) 50 MG 24 hr tablet Take 1 tablet (50 mg total) by mouth daily. 30 tablet 2  . diazepam (VALIUM) 2 MG tablet Take 2 mg by mouth every 6 (six) hours as needed for anxiety.    . furosemide (LASIX) 40 MG tablet Take 1 tablet (40 mg total) by mouth 2 (two) times daily. 30 tablet 0  . gabapentin (NEURONTIN) 100 MG capsule Take 100 mg by mouth 4 (four) times daily.     Marland Kitchen HYDROcodone-acetaminophen (NORCO/VICODIN) 5-325 MG tablet Take 1 tablet by mouth 2 (two) times daily.     Marland Kitchen ipratropium (ATROVENT) 0.02 % nebulizer solution USE 1 VIAL BY NEBULIZATION 4 (FOUR) TIMES DAILY. (Patient taking differently: USE 1 VIAL BY NEBULIZATION VERY EIGHT HOURS) 75 mL 3  . montelukast (SINGULAIR) 10 MG tablet Take 1 tablet (10 mg total) by mouth at bedtime. 90 tablet 0  . ondansetron (ZOFRAN ODT) 8 MG disintegrating tablet Take 1 tablet (8 mg total) by mouth every 8 (eight) hours as needed for nausea or vomiting. 20 tablet 0  . oxybutynin (DITROPAN-XL) 10 MG 24 hr tablet Take 10 mg by mouth at bedtime.     . pantoprazole (PROTONIX) 40 MG tablet Take 1 tablet by mouth every morning.     . polyethylene glycol (MIRALAX / GLYCOLAX) packet Take 17 g by mouth daily as needed for mild constipation.     . rosuvastatin (CRESTOR) 5 MG tablet Take 5 mg by mouth once a week. Sunday  3  . tiZANidine (ZANAFLEX) 2 MG tablet Take by mouth every 6 (six) hours as needed for muscle spasms.    . traZODone (DESYREL) 100 MG tablet Take 1 tablet (100 mg total) by mouth at bedtime. 30 tablet 2  . triamcinolone ointment (KENALOG) 0.1 % Apply 1 application topically daily as needed for skin breakdown.    . warfarin (COUMADIN) 5 MG tablet TAKE 1/2 TO 1 TABLET BY MOUTH EVERY DAY OR AS DIRECTED BY COUMADIN CLINIC (FILLABLE 09/02/16) (Patient taking differently: 5mg  on Mondays, Fridays - 2.5mg  all other days) 30 tablet 1  . losartan (COZAAR) 25 MG tablet Take 1 tablet (25 mg total) by mouth  daily. 30 tablet 0   No facility-administered medications prior to visit.      Allergies:   Alprazolam; Iodine; Pseudoephedrine hcl er; Budesonide-formoterol fumarate; Crestor [rosuvastatin calcium]; Esomeprazole magnesium; Flonase [fluticasone propionate]; Loratadine; Lotrimin [clotrimazole]; Lunesta [eszopiclone]; Oxcarbazepine; Statins; Zolpidem tartrate; Aciphex [rabeprazole sodium]; Avelox [moxifloxacin hcl in nacl]; Bextra [valdecoxib]; Ceclor [cefaclor]; Cephalexin; Covera-hs [verapamil hcl]; Dicyclomine hcl; Other; and Tessalon perles   Social History   Social History  . Marital status: Divorced    Spouse name: N/A  . Number of children: 2  . Years of education: N/A   Occupational History  . disability  Social History Main Topics  . Smoking status: Former Smoker    Quit date: 06/02/2010  . Smokeless tobacco: Never Used  . Alcohol use No  . Drug use: No  . Sexual activity: Yes   Other Topics Concern  . Not on file   Social History Narrative  . No narrative on file     Family History:  The patient's family history includes Alzheimer's disease in her mother; Dementia in her mother; Diabetes in her mother and sister; Heart attack in her father; Heart attack (age of onset: 51) in her brother; Schizophrenia in her sister; Tremor in her sister.   Review of Systems:   Please see the history of present illness.     General:  No chills, fever, night sweats or weight changes.  Cardiovascular:  No  edema, orthopnea, paroxysmal nocturnal dyspnea. Positive for chest pain, palpitations, and dyspnea on exeretion.  Dermatological: No rash, lesions/masses Respiratory: No cough, dyspnea Urologic: No hematuria, dysuria Abdominal:   No nausea, vomiting, diarrhea, bright red blood per rectum, melena, or hematemesis Neurologic:  No visual changes, wkns, changes in mental status. All other systems reviewed and are otherwise negative except as noted above.   Physical Exam:    VS:  BP  (!) 94/58   Pulse 86   Wt 184 lb 8 oz (83.7 kg)   SpO2 94%   BMI 31.67 kg/m    General: Well developed, well nourished Caucasian female appearing in no acute distress. Nasal cannula in place. Head: Normocephalic, atraumatic, sclera non-icteric, no xanthomas, nares are without discharge.  Neck: No carotid bruits. JVD not elevated.  Lungs: Respirations regular and unlabored, without wheezes or rales. Rhonchi along upper lung fields. Heart: Regular rate and rhythm. No S3 or S4.  No murmur, no rubs, or gallops appreciated. Abdomen: Soft, non-tender, non-distended with normoactive bowel sounds. No hepatomegaly. No rebound/guarding. No obvious abdominal masses. Msk:  Strength and tone appear normal for age. No joint deformities or effusions. Extremities: No clubbing or cyanosis. No lower extremity edema.  Distal pedal pulses are 2+ bilaterally. Neuro: Alert and oriented X 3. Moves all extremities spontaneously. No focal deficits noted. Psych:  Responds to questions appropriately with a normal affect. Skin: No rashes or lesions noted  Wt Readings from Last 3 Encounters:  02/24/17 184 lb 8 oz (83.7 kg)  02/12/17 193 lb 8 oz (87.8 kg)  02/08/17 196 lb (88.9 kg)     Studies/Labs Reviewed:   EKG:  EKG is not ordered today. EKG from 02/10/2017 reviewed which showed NSR, HR 95, with PAC's.   Recent Labs: 12/09/2016: ALT 17 02/11/2017: Magnesium 1.8 02/12/2017: B Natriuretic Peptide 523.2; BUN 17; Creatinine, Ser 0.99; Hemoglobin 9.8; Platelets 224; Potassium 4.0; Sodium 135   Lipid Panel    Component Value Date/Time   CHOL 207 (H) 12/09/2016 1328   TRIG 221 (H) 12/09/2016 1328   HDL 35 (L) 12/09/2016 1328   CHOLHDL 5.9 (H) 12/09/2016 1328   CHOLHDL 4.6 07/02/2016 0823   VLDL 25 07/02/2016 0823   LDLCALC 128 (H) 12/09/2016 1328    Additional studies/ records that were reviewed today include:   Echocardiogram: 02/09/2017 Study Conclusions  - Left ventricle: The cavity size was mildly  dilated. Wall   thickness was increased in a pattern of mild LVH. Systolic   function was severely reduced. The estimated ejection fraction   was in the range of 20% to 25%. Diffuse hypokinesis. Doppler   parameters are consistent with abnormal left ventricular  relaxation (grade 1 diastolic dysfunction). - Aortic valve: Valve area (Vmax): 1.4 cm^2. - Aortic root: The aortic root was mildly dilated. - Ascending aorta: The ascending aorta was mildly dilated. - Mitral valve: Calcified annulus. There was moderate   regurgitation. - Left atrium: The atrium was moderately dilated. - Right ventricle: Systolic function was moderately reduced. - Pulmonary arteries: Systolic pressure was mildly increased. - Pericardium, extracardiac: A trivial pericardial effusion was   identified.  Impressions:  - No subcostal views; severe global reduction in LV systolic   function; grade 1 diastolic dysfunction; mildly dilated aortic   root and ascending aorta; moderate MR; moderaet LAE; moderately   reduced RV function; mild TR; mildly elevated pulmonary pressure.   Assessment:    1. Chronic combined systolic and diastolic heart failure (HCC)   2. Cardiomyopathy, unspecified type ([place])   3. Pre-procedural laboratory examination   4. PAF (paroxysmal atrial fibrillation) ([place])   5. Essential hypertension   6. Chronic respiratory failure with hypoxia (HCC)   7. PATENT FORAMEN OVALE      Plan:   In order of problems listed above:  1. Chronic Combined Systolic and Diastolic CHF/ New Cardiomyopathy - recently admitted for worsening dyspnea on exertion and palpitations, noted to be in atrial fibrillation with RVR with echo showing a newly reduced EF of 20-25%. An outpatient right/left heart cath was recommended once her respiratory status improved to rule out an ischemic etiology of her atrial fibrillation. - she has baseline dyspnea on exertion but also reports episodes of chest pain with exertion  over the past few months. Recent EKG was without acute ischemic changes.  - discussed with Dr. Claiborne Billings (DOD). Will plan for cardiac catheterization next week. The patient understands that risks include but are not limited to stroke (1 in 1000), death (1 in 58), kidney failure [usually temporary] (1 in 500), bleeding (1 in 200), allergic reaction [possibly serious] (1 in 200). Check labs today. Patient is unsure if she has received contrast in the past but reports many allergies. Will pre-treat for contrast allergy the day of the procedure. - I reached out to Dr. Martinique in regards to her needing a Lovenox bridge in the setting of her PAF and prior stroke. The patient was initially hesitant to do this but is aware she would be of increased stroke risk if she is not bridged while Coumadin is held. Pharmacy is aware of the need for Lovenox bridge. Will order Home Health to assist with injections at the request of the patient's family.  - continue BB, ARB, and statin. No ASA secondary to need for Coumadin. She is only taking Lasix 40mg  daily at this time. Will check BMET to assess creatinine and K+ as she is not currently on K+ supplementation.   2. Paroxysmal Atrial Fibrillation - This patients CHA2DS2-VASc Score and unadjusted Ischemic Stroke Rate (% per year) is equal to 9.7 % stroke rate/year from a score of 6 (CHF, HTN, Female, Age, CVA (2)). She denies any evidence of active bleeding. Continue Coumadin for anticoagulation. Will need Lovenox bridge for cath as above.  - continue Coreg 3.125mg  BID for rate-control. Unable to further titrate secondary to hypotension.   3. HTN - has actually been hypotensive at home and BP is soft at 94/58 today. - continue Coreg 3.125mg  BID. Reduce Losartan from 25mg  daily to 12.5mg  daily  4. COPD - followed by Pulmonology. Remains on 2L [place].   5. PFO - no plans for closure at this time.  Medication Adjustments/Labs and Tests Ordered: Current medicines are  reviewed at length with the patient today.  Concerns regarding medicines are outlined above.  Medication changes, Labs and Tests ordered today are listed in the Patient Instructions below. Patient Instructions  Medication Instructions:  DECREASE losartan to 12.5mg  daily (1/2 tablet)  Labwork: Please have labwork TODAY (PT/INR, BMET, CBC diff) in preparation for your cardiac catherization.  -[place] [address]  Testing/Procedures: Your physician has requested that you have a cardiac catheterization. Cardiac catheterization is used to diagnose and/or treat various heart conditions. Doctors may recommend this procedure for a number of different reasons. The most common reason is to evaluate chest pain. Chest pain can be a symptom of coronary artery disease (CAD), and cardiac catheterization can show whether plaque is narrowing or blocking your heart's arteries. This procedure is also used to evaluate the valves, as well as measure the blood flow and oxygen levels in different parts of your heart. For further information please visit HugeFiesta.tn. Please follow instruction sheet, as given.  Follow-Up: Your physician recommends that you schedule a follow-up appointment in: Dr. Martinique after cath.  Any Other Special Instructions Will Be Listed Below (If Applicable).  If you need a refill on your cardiac medications before your next appointment, please call your pharmacy.   [facility] [address] [place] [address] Dept: [phone] Loc: [place]  02/24/2017  You are scheduled for a Cardiac Catheterization on Thursday, May 3 at 12:00 PM with Dr. Peter Martinique.  1. Please arrive at the [facility] (Main Entrance A) at [facility]: [place], [place] [address] at 10:00 AM (two hours before your procedure to ensure your preparation). Free valet  parking service is available.   Special note: Every effort is made to have your procedure done on time. Please understand that emergencies sometimes delay scheduled procedures.  2. Diet: Do not eat or drink anything after midnight prior to your procedure except sips of water to take medications.  3. Labs: You will need to have blood drawn on Wednesday, April 25 at [facility]  Open: [place] (Lunch 12:30 - 1:30)   Phone: [phone]. You do not need to be fasting.  4. Medication instructions in preparation for your procedure:  Stop taking Coumadin (Warfarin) on Saturday, April 28.  Bernerd Pho PA will speak to Dr. Martinique in regards to a Lovenox bridge.    On the morning of your procedure take any morning medicines NOT listed above.  You may use sips of water.  5. Plan for one night stay--bring personal belongings. 6. Bring a current list of your medications and current insurance cards. 7. You MUST have a responsible person to drive you home. 8. Someone MUST be with you the first 24 hours after you arrive home or your discharge will be delayed. 9. Please wear clothes that are easy to get on and off and wear slip-on shoes.  Thank you for allowing [place] to care for you!   --  Invasive Cardiovascular services     Signed, Waynetta Pean  02/24/2017 7:25 PM    [facility] [place], [place], [place]  [address] Phone: [phone]; Fax: [phone]  [address] [place], [place] [address] Phone: [phone]

## 2017-02-24 ENCOUNTER — Encounter: Payer: Self-pay | Admitting: Student

## 2017-02-24 ENCOUNTER — Other Ambulatory Visit: Payer: Self-pay | Admitting: Student

## 2017-02-24 ENCOUNTER — Ambulatory Visit (INDEPENDENT_AMBULATORY_CARE_PROVIDER_SITE_OTHER): Payer: Medicare Other | Admitting: Pharmacist

## 2017-02-24 ENCOUNTER — Ambulatory Visit (INDEPENDENT_AMBULATORY_CARE_PROVIDER_SITE_OTHER): Payer: Medicare Other | Admitting: Student

## 2017-02-24 VITALS — BP 94/58 | HR 86 | Wt 184.5 lb

## 2017-02-24 DIAGNOSIS — I10 Essential (primary) hypertension: Secondary | ICD-10-CM | POA: Diagnosis not present

## 2017-02-24 DIAGNOSIS — I5042 Chronic combined systolic (congestive) and diastolic (congestive) heart failure: Secondary | ICD-10-CM

## 2017-02-24 DIAGNOSIS — I429 Cardiomyopathy, unspecified: Secondary | ICD-10-CM

## 2017-02-24 DIAGNOSIS — Z7901 Long term (current) use of anticoagulants: Secondary | ICD-10-CM | POA: Diagnosis not present

## 2017-02-24 DIAGNOSIS — Z79899 Other long term (current) drug therapy: Secondary | ICD-10-CM

## 2017-02-24 DIAGNOSIS — I480 Paroxysmal atrial fibrillation: Secondary | ICD-10-CM | POA: Diagnosis not present

## 2017-02-24 DIAGNOSIS — Q211 Atrial septal defect: Secondary | ICD-10-CM

## 2017-02-24 DIAGNOSIS — J9611 Chronic respiratory failure with hypoxia: Secondary | ICD-10-CM

## 2017-02-24 DIAGNOSIS — Z01812 Encounter for preprocedural laboratory examination: Secondary | ICD-10-CM | POA: Diagnosis not present

## 2017-02-24 DIAGNOSIS — Q2111 Secundum atrial septal defect: Secondary | ICD-10-CM

## 2017-02-24 LAB — POCT INR
INR: 1.4
INR: 1.4

## 2017-02-24 MED ORDER — LOSARTAN POTASSIUM 25 MG PO TABS: 12.5000 mg | ORAL_TABLET | Freq: Every day | ORAL | 0 refills | Status: DC

## 2017-02-24 NOTE — Patient Instructions (Addendum)
Medication Instructions:  DECREASE losartan to 12.5mg  daily (1/2 tablet)  Labwork: Please have labwork TODAY (PT/INR, BMET, CBC diff) in preparation for your cardiac catherization.  -[place] [address]  Testing/Procedures: Your physician has requested that you have a cardiac catheterization. Cardiac catheterization is used to diagnose and/or treat various heart conditions. Doctors may recommend this procedure for a number of different reasons. The most common reason is to evaluate chest pain. Chest pain can be a symptom of coronary artery disease (CAD), and cardiac catheterization can show whether plaque is narrowing or blocking your heart's arteries. This procedure is also used to evaluate the valves, as well as measure the blood flow and oxygen levels in different parts of your heart. For further information please visit HugeFiesta.tn. Please follow instruction sheet, as given.   Follow-Up: Your physician recommends that you schedule a follow-up appointment in: Dr. Martinique after cath.   Any Other Special Instructions Will Be Listed Below (If Applicable).     If you need a refill on your cardiac medications before your next appointment, please call your pharmacy.        [facility] [address] [place] [address] Dept: [phone] Loc: [place]  02/24/2017  You are scheduled for a Cardiac Catheterization on Thursday, May 3 at 12:00 PM with Dr. Peter Martinique.  1. Please arrive at the [facility] (Main Entrance A) at [facility]: [place], [place] [address] at 10:00 AM (two hours before your procedure to ensure your preparation). Free valet parking service is available.   Special note: Every effort is made to have your procedure done on time. Please understand that emergencies sometimes delay scheduled procedures.  2. Diet: Do  not eat or drink anything after midnight prior to your procedure except sips of water to take medications.  3. Labs: You will need to have blood drawn on Wednesday, April 25 at [place]  Open: [place] (Lunch 12:30 - 1:30)   Phone: [phone]. You do not need to be fasting.  4. Medication instructions in preparation for your procedure:  Stop taking Coumadin (Warfarin) on Saturday, April 28.  Bernerd Pho PA will speak to Dr. Martinique in regards to a Lovenox bridge.    On the morning of your procedure take any morning medicines NOT listed above.  You may use sips of water.  5. Plan for one night stay--bring personal belongings. 6. Bring a current list of your medications and current insurance cards. 7. You MUST have a responsible person to drive you home. 8. Someone MUST be with you the first 24 hours after you arrive home or your discharge will be delayed. 9. Please wear clothes that are easy to get on and off and wear slip-on shoes.  Thank you for allowing [place] to care for you!   -- [facility] Invasive Cardiovascular services

## 2017-02-25 ENCOUNTER — Telehealth: Payer: Self-pay | Admitting: Pharmacist

## 2017-02-25 ENCOUNTER — Other Ambulatory Visit: Payer: Self-pay | Admitting: Cardiology

## 2017-02-25 DIAGNOSIS — I480 Paroxysmal atrial fibrillation: Secondary | ICD-10-CM

## 2017-02-25 LAB — CBC WITH DIFFERENTIAL/PLATELET
BASOS ABS: 0 10*3/uL (ref 0.0–0.2)
BASOS: 0 %
EOS (ABSOLUTE): 0.1 10*3/uL (ref 0.0–0.4)
Eos: 1 %
HEMOGLOBIN: 11.3 g/dL (ref 11.1–15.9)
Hematocrit: 36.1 % (ref 34.0–46.6)
IMMATURE GRANS (ABS): 0 10*3/uL (ref 0.0–0.1)
Immature Granulocytes: 0 %
LYMPHS ABS: 1.7 10*3/uL (ref 0.7–3.1)
LYMPHS: 30 %
MCH: 26.8 pg (ref 26.6–33.0)
MCHC: 31.3 g/dL — AB (ref 31.5–35.7)
MCV: 86 fL (ref 79–97)
Monocytes Absolute: 0.7 10*3/uL (ref 0.1–0.9)
Monocytes: 12 %
NEUTROS ABS: 3.2 10*3/uL (ref 1.4–7.0)
Neutrophils: 57 %
PLATELETS: 272 10*3/uL (ref 150–379)
RBC: 4.22 x10E6/uL (ref 3.77–5.28)
RDW: 15.4 % (ref 12.3–15.4)
WBC: 5.6 10*3/uL (ref 3.4–10.8)

## 2017-02-25 LAB — BASIC METABOLIC PANEL
BUN / CREAT RATIO: 17 (ref 12–28)
BUN: 17 mg/dL (ref 8–27)
CALCIUM: 9.9 mg/dL (ref 8.7–10.3)
CHLORIDE: 92 mmol/L — AB (ref 96–106)
CO2: 31 mmol/L — AB (ref 18–29)
Creatinine, Ser: 1.03 mg/dL — ABNORMAL HIGH (ref 0.57–1.00)
GFR, EST AFRICAN AMERICAN: 63 mL/min/{1.73_m2} (ref >59)
GFR, EST NON AFRICAN AMERICAN: 55 mL/min/{1.73_m2} — AB (ref >59)
Glucose: 92 mg/dL (ref 65–99)
POTASSIUM: 4.4 mmol/L (ref 3.5–5.2)
Sodium: 140 mmol/L (ref 134–144)

## 2017-02-25 MED ORDER — ENOXAPARIN SODIUM 120 MG/0.8ML ~~LOC~~ SOLN: 120.0000 mg | SUBCUTANEOUS | 1 refills | Status: DC

## 2017-02-25 NOTE — Telephone Encounter (Signed)
Rx has been sent to the pharmacy electronically. ° °

## 2017-02-25 NOTE — Telephone Encounter (Signed)
-----   Message from Erma Heritage, [place] sent at 02/24/2017  2:48 PM EDT ----- Regarding: Lovenox Bridge Hi Angelina Venard,   I talked with Dr. Martinique and he does wish for her to have Lovenox bridging with her known reduced EF and prior CVA. Does Pharmacy handle that or what do I need to do? I have never arranged this from the outpatient setting. Her daughter requested Home Health come out and give the injections and I was not sure if that is something they even offer.   Thanks for your help!  [place],  [place]

## 2017-02-25 NOTE — Telephone Encounter (Signed)
Talked to patient - will email bridge plan to personal email as requested by patient.   Rx sent to prefer pharmacy; Home health nurse to administer medication   April/27: Last dose of warfarin.  April/28: No warfarin or Lovenox.  April/29: Inject Lovenox (enoxaparin) 120mg  in the fatty abdominal tissue at least 2 inches from the belly button daily in the morning. No warfarin.  April/30: Inject Lovenox in the fatty tissue in the morning. No warfarin.  May/1: Inject Lovenox in the fatty tissue in the morning. No warfarin.  May/2: Inject Lovenox in the fatty tissue in the morning. No warfarin.  May/3: Procedure Day - No Lovenox - Resume Coumadin in the evening or as directed by doctor   May/4: Resume Lovenox in the morning and take warfarin 5mg .  May/5: Inject Lovenox in the morning and take warfarin 5mg .  May/6: Inject Lovenox in in the morning and take warfarin 2.5mg .  May/7: Inject Lovenox in the in the morning and take warfarin 5mg  .  May/8: Inject Lovenox in the in the morning and take warfarin 2.5mg .  May/9: No injection until - Coumadin appt to check INR at 9:30am

## 2017-03-02 DIAGNOSIS — I509 Heart failure, unspecified: Secondary | ICD-10-CM

## 2017-03-02 HISTORY — DX: Heart failure, unspecified: I50.9

## 2017-03-04 ENCOUNTER — Ambulatory Visit (HOSPITAL_COMMUNITY)
Admission: RE | Disposition: A | Discharge status: Home / Self Care | Payer: Self-pay | Source: Ambulatory Visit | Attending: Cardiology

## 2017-03-04 ENCOUNTER — Ambulatory Visit (HOSPITAL_COMMUNITY)
Admission: RE | Admit: 2017-03-04 | Discharge: 2017-03-04 | Disposition: A | Discharge status: Home / Self Care | Payer: Medicare Other | Source: Ambulatory Visit | Attending: Cardiology | Admitting: Cardiology

## 2017-03-04 DIAGNOSIS — I5022 Chronic systolic (congestive) heart failure: Secondary | ICD-10-CM | POA: Diagnosis present

## 2017-03-04 DIAGNOSIS — Z91041 Radiographic dye allergy status: Secondary | ICD-10-CM | POA: Insufficient documentation

## 2017-03-04 DIAGNOSIS — E669 Obesity, unspecified: Secondary | ICD-10-CM | POA: Insufficient documentation

## 2017-03-04 DIAGNOSIS — Z8673 Personal history of transient ischemic attack (TIA), and cerebral infarction without residual deficits: Secondary | ICD-10-CM | POA: Diagnosis not present

## 2017-03-04 DIAGNOSIS — Z6831 Body mass index (BMI) 31.0-31.9, adult: Secondary | ICD-10-CM | POA: Diagnosis not present

## 2017-03-04 DIAGNOSIS — M810 Age-related osteoporosis without current pathological fracture: Secondary | ICD-10-CM | POA: Insufficient documentation

## 2017-03-04 DIAGNOSIS — E785 Hyperlipidemia, unspecified: Secondary | ICD-10-CM | POA: Insufficient documentation

## 2017-03-04 DIAGNOSIS — Z7901 Long term (current) use of anticoagulants: Secondary | ICD-10-CM | POA: Insufficient documentation

## 2017-03-04 DIAGNOSIS — Z87891 Personal history of nicotine dependence: Secondary | ICD-10-CM | POA: Diagnosis not present

## 2017-03-04 DIAGNOSIS — K219 Gastro-esophageal reflux disease without esophagitis: Secondary | ICD-10-CM | POA: Insufficient documentation

## 2017-03-04 DIAGNOSIS — J9611 Chronic respiratory failure with hypoxia: Secondary | ICD-10-CM | POA: Insufficient documentation

## 2017-03-04 DIAGNOSIS — J449 Chronic obstructive pulmonary disease, unspecified: Secondary | ICD-10-CM | POA: Insufficient documentation

## 2017-03-04 DIAGNOSIS — Q211 Atrial septal defect: Secondary | ICD-10-CM | POA: Insufficient documentation

## 2017-03-04 DIAGNOSIS — I272 Pulmonary hypertension, unspecified: Secondary | ICD-10-CM | POA: Diagnosis not present

## 2017-03-04 DIAGNOSIS — Z8249 Family history of ischemic heart disease and other diseases of the circulatory system: Secondary | ICD-10-CM | POA: Insufficient documentation

## 2017-03-04 DIAGNOSIS — I110 Hypertensive heart disease with heart failure: Secondary | ICD-10-CM | POA: Diagnosis not present

## 2017-03-04 DIAGNOSIS — F329 Major depressive disorder, single episode, unspecified: Secondary | ICD-10-CM | POA: Insufficient documentation

## 2017-03-04 DIAGNOSIS — I2510 Atherosclerotic heart disease of native coronary artery without angina pectoris: Secondary | ICD-10-CM | POA: Diagnosis not present

## 2017-03-04 DIAGNOSIS — I480 Paroxysmal atrial fibrillation: Secondary | ICD-10-CM | POA: Diagnosis not present

## 2017-03-04 DIAGNOSIS — I2582 Chronic total occlusion of coronary artery: Secondary | ICD-10-CM | POA: Diagnosis not present

## 2017-03-04 DIAGNOSIS — K449 Diaphragmatic hernia without obstruction or gangrene: Secondary | ICD-10-CM | POA: Diagnosis not present

## 2017-03-04 DIAGNOSIS — I429 Cardiomyopathy, unspecified: Secondary | ICD-10-CM

## 2017-03-04 DIAGNOSIS — M797 Fibromyalgia: Secondary | ICD-10-CM | POA: Insufficient documentation

## 2017-03-04 DIAGNOSIS — I255 Ischemic cardiomyopathy: Secondary | ICD-10-CM | POA: Diagnosis not present

## 2017-03-04 DIAGNOSIS — I2584 Coronary atherosclerosis due to calcified coronary lesion: Secondary | ICD-10-CM | POA: Insufficient documentation

## 2017-03-04 DIAGNOSIS — I5042 Chronic combined systolic (congestive) and diastolic (congestive) heart failure: Secondary | ICD-10-CM

## 2017-03-04 DIAGNOSIS — I10 Essential (primary) hypertension: Secondary | ICD-10-CM | POA: Diagnosis present

## 2017-03-04 HISTORY — PX: RIGHT/LEFT HEART CATH AND CORONARY ANGIOGRAPHY: CATH118266

## 2017-03-04 LAB — POCT I-STAT 3, ART BLOOD GAS (G3+)
Acid-Base Excess: 9 mmol/L — ABNORMAL HIGH (ref 0.0–2.0)
BICARBONATE: 33 mmol/L — AB (ref 20.0–28.0)
O2 SAT: 92 %
PCO2 ART: 42.9 mmHg (ref 32.0–48.0)
PH ART: 7.495 — AB (ref 7.350–7.450)
TCO2: 34 mmol/L (ref 0–100)
pO2, Arterial: 58 mmHg — ABNORMAL LOW (ref 83.0–108.0)

## 2017-03-04 LAB — POCT I-STAT 3, VENOUS BLOOD GAS (G3P V)
ACID-BASE EXCESS: 8 mmol/L — AB (ref 0.0–2.0)
BICARBONATE: 33.9 mmol/L — AB (ref 20.0–28.0)
O2 SAT: 56 %
PCO2 VEN: 49.9 mmHg (ref 44.0–60.0)
PH VEN: 7.44 — AB (ref 7.250–7.430)
PO2 VEN: 29 mmHg — AB (ref 32.0–45.0)
TCO2: 35 mmol/L (ref 0–100)

## 2017-03-04 LAB — PROTIME-INR
INR: 1.06
Prothrombin Time: 13.9 seconds (ref 11.4–15.2)

## 2017-03-04 SURGERY — RIGHT/LEFT HEART CATH AND CORONARY ANGIOGRAPHY
Anesthesia: LOCAL

## 2017-03-04 MED ORDER — ASPIRIN 81 MG PO CHEW
CHEWABLE_TABLET | ORAL | Status: AC
  Administered 2017-03-04: 81 mg via ORAL
  Filled 2017-03-04: qty 1

## 2017-03-04 MED ORDER — HEPARIN SODIUM (PORCINE) 1000 UNIT/ML IJ SOLN
INTRAMUSCULAR | Status: AC
  Filled 2017-03-04: qty 1

## 2017-03-04 MED ORDER — SODIUM CHLORIDE 0.9% FLUSH: 3.0000 mL | Freq: Two times a day (BID) | INTRAVENOUS | Status: DC

## 2017-03-04 MED ORDER — HEPARIN (PORCINE) IN NACL 2-0.9 UNIT/ML-% IJ SOLN
INTRAMUSCULAR | Status: AC
  Filled 2017-03-04: qty 1000

## 2017-03-04 MED ORDER — ASPIRIN 81 MG PO CHEW
81.0000 mg | CHEWABLE_TABLET | ORAL | Status: AC
  Administered 2017-03-04: 81 mg via ORAL

## 2017-03-04 MED ORDER — MIDAZOLAM HCL 2 MG/2ML IJ SOLN
INTRAMUSCULAR | Status: DC | PRN
  Administered 2017-03-04: 1 mg via INTRAVENOUS

## 2017-03-04 MED ORDER — SODIUM CHLORIDE 0.9 % WEIGHT BASED INFUSION
1.0000 mL/kg/h | INTRAVENOUS | Status: AC
  Administered 2017-03-04: 1 mL/kg/h via INTRAVENOUS

## 2017-03-04 MED ORDER — SODIUM CHLORIDE 0.9 % IV SOLN: 250.0000 mL | INTRAVENOUS | Status: DC | PRN

## 2017-03-04 MED ORDER — HEPARIN (PORCINE) IN NACL 2-0.9 UNIT/ML-% IJ SOLN
INTRAMUSCULAR | Status: AC
  Filled 2017-03-04: qty 500

## 2017-03-04 MED ORDER — DIPHENHYDRAMINE HCL 50 MG/ML IJ SOLN
25.0000 mg | INTRAMUSCULAR | Status: AC
  Administered 2017-03-04: 25 mg via INTRAVENOUS

## 2017-03-04 MED ORDER — SODIUM CHLORIDE 0.9% FLUSH: 3.0000 mL | INTRAVENOUS | Status: DC | PRN

## 2017-03-04 MED ORDER — FAMOTIDINE 20 MG PO TABS
20.0000 mg | ORAL_TABLET | ORAL | Status: AC
  Administered 2017-03-04: 20 mg via ORAL

## 2017-03-04 MED ORDER — DIPHENHYDRAMINE HCL 50 MG/ML IJ SOLN
INTRAMUSCULAR | Status: AC
  Administered 2017-03-04: 25 mg via INTRAVENOUS
  Filled 2017-03-04: qty 1

## 2017-03-04 MED ORDER — METHYLPREDNISOLONE SODIUM SUCC 125 MG IJ SOLR
125.0000 mg | INTRAMUSCULAR | Status: AC
  Administered 2017-03-04: 125 mg via INTRAVENOUS

## 2017-03-04 MED ORDER — SODIUM CHLORIDE 0.9 % IV SOLN
INTRAVENOUS | Status: DC
  Administered 2017-03-04: 11:00:00 via INTRAVENOUS

## 2017-03-04 MED ORDER — LIDOCAINE HCL 1 % IJ SOLN
INTRAMUSCULAR | Status: AC
  Filled 2017-03-04: qty 20

## 2017-03-04 MED ORDER — VERAPAMIL HCL 2.5 MG/ML IV SOLN
INTRAVENOUS | Status: DC | PRN
  Administered 2017-03-04: 10 mL via INTRA_ARTERIAL

## 2017-03-04 MED ORDER — IOPAMIDOL (ISOVUE-370) INJECTION 76%
INTRAVENOUS | Status: AC
  Filled 2017-03-04: qty 50

## 2017-03-04 MED ORDER — IOPAMIDOL (ISOVUE-370) INJECTION 76%
INTRAVENOUS | Status: AC
  Filled 2017-03-04: qty 100

## 2017-03-04 MED ORDER — HEPARIN (PORCINE) IN NACL 2-0.9 UNIT/ML-% IJ SOLN
INTRAMUSCULAR | Status: DC | PRN
  Administered 2017-03-04: 1000 mL

## 2017-03-04 MED ORDER — HEPARIN SODIUM (PORCINE) 1000 UNIT/ML IJ SOLN
INTRAMUSCULAR | Status: DC | PRN
  Administered 2017-03-04: 4000 [IU] via INTRAVENOUS

## 2017-03-04 MED ORDER — MIDAZOLAM HCL 2 MG/2ML IJ SOLN
INTRAMUSCULAR | Status: AC
  Filled 2017-03-04: qty 2

## 2017-03-04 MED ORDER — LIDOCAINE HCL (PF) 1 % IJ SOLN
INTRAMUSCULAR | Status: DC | PRN
  Administered 2017-03-04: 5 mL via INTRADERMAL

## 2017-03-04 MED ORDER — VERAPAMIL HCL 2.5 MG/ML IV SOLN
INTRAVENOUS | Status: AC
  Filled 2017-03-04: qty 2

## 2017-03-04 MED ORDER — IOPAMIDOL (ISOVUE-370) INJECTION 76%
INTRAVENOUS | Status: DC | PRN
  Administered 2017-03-04: 130 mL via INTRAVENOUS

## 2017-03-04 MED ORDER — METHYLPREDNISOLONE SODIUM SUCC 125 MG IJ SOLR
INTRAMUSCULAR | Status: AC
  Administered 2017-03-04: 125 mg via INTRAVENOUS
  Filled 2017-03-04: qty 2

## 2017-03-04 MED ORDER — FAMOTIDINE 20 MG PO TABS
ORAL_TABLET | ORAL | Status: AC
Start: 2017-03-04 — End: 2017-03-04
  Administered 2017-03-04: 20 mg via ORAL
  Filled 2017-03-04: qty 1

## 2017-03-04 SURGICAL SUPPLY — 17 items
CATH 5FR JL3.5 JR4 ANG PIG MP (CATHETERS) ×2 IMPLANT
CATH BALLN WEDGE 5F 110CM (CATHETERS) ×2 IMPLANT
CATH INFINITI 5FR AL1 (CATHETERS) ×2 IMPLANT
CATH LAUNCHER 5F RADR (CATHETERS) ×1 IMPLANT
CATHETER LAUNCHER 5F RADR (CATHETERS) ×2
DEVICE RAD COMP TR BAND LRG (VASCULAR PRODUCTS) ×2 IMPLANT
GLIDESHEATH SLEND SS 6F .021 (SHEATH) ×2 IMPLANT
GUIDEWIRE .025 260CM (WIRE) ×2 IMPLANT
GUIDEWIRE INQWIRE 1.5J.035X260 (WIRE) ×1 IMPLANT
INQWIRE 1.5J .035X260CM (WIRE) ×2
KIT HEART LEFT (KITS) ×2 IMPLANT
PACK CARDIAC CATHETERIZATION (CUSTOM PROCEDURE TRAY) ×2 IMPLANT
SHEATH GLIDE SLENDER 4/5FR (SHEATH) ×2 IMPLANT
SYR MEDRAD MARK V 150ML (SYRINGE) ×2 IMPLANT
TRANSDUCER W/STOPCOCK (MISCELLANEOUS) ×2 IMPLANT
TUBING CIL FLEX 10 FLL-RA (TUBING) ×2 IMPLANT
WIRE HI TORQ VERSACORE-J 145CM (WIRE) ×2 IMPLANT

## 2017-03-04 NOTE — Interval H&P Note (Signed)
History and Physical Interval Note:  03/04/2017 12:29 PM  Laurie Horton  has presented today for surgery, with the diagnosis of cardiomyopathy  The various methods of treatment have been discussed with the patient and family. After consideration of risks, benefits and other options for treatment, the patient has consented to  Procedure(s): Right/Left Heart Cath and Coronary Angiography (N/A) as a surgical intervention .  The patient's history has been reviewed, patient examined, no change in status, stable for surgery.  I have reviewed the patient's chart and labs.  Questions were answered to the patient's satisfaction.   Cath Lab Visit (complete for each Cath Lab visit)  Clinical Evaluation Leading to the Procedure:   ACS: No.  Non-ACS:    Anginal Classification: CCS III  Anti-ischemic medical therapy: Maximal Therapy (2 or more classes of medications)  Non-Invasive Test Results: No non-invasive testing performed  Prior CABG: No previous CABG        Laurie Horton [facility] 03/04/2017 12:29 PM

## 2017-03-04 NOTE — H&P (View-Only) (Signed)
Cardiology Office Note    Date:  02/24/2017   ID:  Laurie Horton 10/02/1946, MRN [id number]  PCP:  [name], MD  Cardiologist: Dr. Martinique   Chief Complaint  Patient presents with  . Atrial Fibrillation  . Congestive Heart Failure  . Shortness of Breath    dizziness    History of Present Illness:    Laurie Horton is a 71 y.o. female with past medical history of chronic diastolic CHF, PFO, PAF (on Coumadin), COPD (on 2L [place] at baseline), HTN, and prior CVA who presents to the office today for hospital follow-up.   She was recently admitted from 4/9 - 02/12/2017 for worsening dyspnea on exertion and palpitations. Was in atrial fibrillation with RVR upon arrival to the ED. Echo during admission showed a newly reduced EF of 20-25% and she was diuresed with IV Lasix. Was followed by Cardiology and it was thought her cardiomyopathy was possibly of a Tak[place]ubo etiology as she denied any recent anginal symptoms with enzymes being negative and EKG showing no acute ischemic changes. It was recommended to consider a right/left heart cath in 4-6 weeks. Weight was 193 lbs at time of discharge and she was continued on Lasix 40mg  BID along with Coreg 3.125mg  BID and Losartan 25mg  daily.  The patient reports today that since her recent hospitalization, her breathing has improved. She has baseline dyspnea on exertion and uses 2L [place] at baseline but denies any acute worsening in her symptoms. No orthopnea, PND or lower extremity edema. She reports good compliance with her medication regimen and has been keeping a very detailed log of her sodium and fluid intake. She has noted multiple times that her blood pressure has been soft with systolic readings in the 64'W. Has associated dizziness when her blood pressure runs this low. Repeat readings later in the day had improved into the low-100's.   She has noted episodes of chest pain and palpitations, which can occur at rest or with exertion. She  asks today if her cardiac catheterization can be later this week as she wants answers as to why her EF is reduced.    Past Medical History:  Diagnosis Date  . Complication of anesthesia    various issues with oxygen  saturations post op  . COPD (chronic obstructive pulmonary disease) ([place])   . Depression   . Diverticulitis   . DVT (deep venous thrombosis) ([place])   . Fibromyalgia   . GERD (gastroesophageal reflux disease)   . Hiatal hernia   . History of deviated nasal septum    left- side  . HTN (hypertension)   . Hyperlipidemia   . Obesity   . On supplemental oxygen therapy    concentrator at night @ 1.5 l/m or when sleeps. O2 Sat niormally 87.  . Osteoporosis   . PAT (paroxysmal atrial tachycardia) ([place])   . PFO (patent foramen ovale)   . PULMONARY NODULE, LEFT LOWER LOBE 10/14/2009   68mm LLL nodule dec 2010. Stable and 57mm in Oct 2012. No further fu  . Right middle lobe pneumonia ([place]) 07/24/2011   First noted at admit 07/10/11. Persists on cxr 07/22/11. Cleared on CT 08/24/11. No further followup  . Stroke ([place])   . TOBACCO ABUSE 06/04/2009    Past Surgical History:  Procedure Laterality Date  . CARDIOVASCULAR STRESS TEST  12/26/2004   EF 74%. NO EVIDENCE OF ISCHEMIA  . ESOPHAGOGASTRODUODENOSCOPY (EGD) WITH PROPOFOL N/A 04/15/2015   Procedure: ESOPHAGOGASTRODUODENOSCOPY (EGD) WITH PROPOFOL;  Surgeon:  Laurence Spates, MD;  Location: [facility];  Service: Endoscopy;  Laterality: N/A;  . KNEE ARTHROSCOPY  2000   left  . LAPAROSCOPIC CHOLECYSTECTOMY  04-16-2010   [facility]  . MOUTH SURGERY     03-26-15 multiple extractions stitches remains  . TOTAL ABDOMINAL HYSTERECTOMY     post op needed oxygen was told "she gave them a scare"  . TUBAL LIGATION    . US ECHOCARDIOGRAPHY  11/20/2009   EF 55-60%    Current Medications: Outpatient Medications Prior to Visit  Medication Sig Dispense Refill  . carvedilol (COREG) 3.125 MG tablet Take 1 tablet (3.125 mg total) by mouth 2 (two) times  daily with a meal. 60 tablet 0  . desvenlafaxine (PRISTIQ) 50 MG 24 hr tablet Take 1 tablet (50 mg total) by mouth daily. 30 tablet 2  . diazepam (VALIUM) 2 MG tablet Take 2 mg by mouth every 6 (six) hours as needed for anxiety.    . furosemide (LASIX) 40 MG tablet Take 1 tablet (40 mg total) by mouth 2 (two) times daily. 30 tablet 0  . gabapentin (NEURONTIN) 100 MG capsule Take 100 mg by mouth 4 (four) times daily.     Marland Kitchen HYDROcodone-acetaminophen (NORCO/VICODIN) 5-325 MG tablet Take 1 tablet by mouth 2 (two) times daily.     Marland Kitchen ipratropium (ATROVENT) 0.02 % nebulizer solution USE 1 VIAL BY NEBULIZATION 4 (FOUR) TIMES DAILY. (Patient taking differently: USE 1 VIAL BY NEBULIZATION VERY EIGHT HOURS) 75 mL 3  . montelukast (SINGULAIR) 10 MG tablet Take 1 tablet (10 mg total) by mouth at bedtime. 90 tablet 0  . ondansetron (ZOFRAN ODT) 8 MG disintegrating tablet Take 1 tablet (8 mg total) by mouth every 8 (eight) hours as needed for nausea or vomiting. 20 tablet 0  . oxybutynin (DITROPAN-XL) 10 MG 24 hr tablet Take 10 mg by mouth at bedtime.     . pantoprazole (PROTONIX) 40 MG tablet Take 1 tablet by mouth every morning.     . polyethylene glycol (MIRALAX / GLYCOLAX) packet Take 17 g by mouth daily as needed for mild constipation.     . rosuvastatin (CRESTOR) 5 MG tablet Take 5 mg by mouth once a week. Sunday  3  . tiZANidine (ZANAFLEX) 2 MG tablet Take by mouth every 6 (six) hours as needed for muscle spasms.    . traZODone (DESYREL) 100 MG tablet Take 1 tablet (100 mg total) by mouth at bedtime. 30 tablet 2  . triamcinolone ointment (KENALOG) 0.1 % Apply 1 application topically daily as needed for skin breakdown.    . warfarin (COUMADIN) 5 MG tablet TAKE 1/2 TO 1 TABLET BY MOUTH EVERY DAY OR AS DIRECTED BY COUMADIN CLINIC (FILLABLE 09/02/16) (Patient taking differently: 5mg  on Mondays, Fridays - 2.5mg  all other days) 30 tablet 1  . losartan (COZAAR) 25 MG tablet Take 1 tablet (25 mg total) by mouth  daily. 30 tablet 0   No facility-administered medications prior to visit.      Allergies:   Alprazolam; Iodine; Pseudoephedrine hcl er; Budesonide-formoterol fumarate; Crestor [rosuvastatin calcium]; Esomeprazole magnesium; Flonase [fluticasone propionate]; Loratadine; Lotrimin [clotrimazole]; Lunesta [eszopiclone]; Oxcarbazepine; Statins; Zolpidem tartrate; Aciphex [rabeprazole sodium]; Avelox [moxifloxacin hcl in nacl]; Bextra [valdecoxib]; Ceclor [cefaclor]; Cephalexin; Covera-hs [verapamil hcl]; Dicyclomine hcl; Other; and Tessalon perles   Social History   Social History  . Marital status: Divorced    Spouse name: N/A  . Number of children: 2  . Years of education: N/A   Occupational History  . disability  Social History Main Topics  . Smoking status: Former Smoker    Quit date: 06/02/2010  . Smokeless tobacco: Never Used  . Alcohol use No  . Drug use: No  . Sexual activity: Yes   Other Topics Concern  . Not on file   Social History Narrative  . No narrative on file     Family History:  The patient's family history includes Alzheimer's disease in her mother; Dementia in her mother; Diabetes in her mother and sister; Heart attack in her father; Heart attack (age of onset: 76) in her brother; Schizophrenia in her sister; Tremor in her sister.   Review of Systems:   Please see the history of present illness.     General:  No chills, fever, night sweats or weight changes.  Cardiovascular:  No  edema, orthopnea, paroxysmal nocturnal dyspnea. Positive for chest pain, palpitations, and dyspnea on exeretion.  Dermatological: No rash, lesions/masses Respiratory: No cough, dyspnea Urologic: No hematuria, dysuria Abdominal:   No nausea, vomiting, diarrhea, bright red blood per rectum, melena, or hematemesis Neurologic:  No visual changes, wkns, changes in mental status. All other systems reviewed and are otherwise negative except as noted above.   Physical Exam:    VS:  BP  (!) 94/58   Pulse 86   Wt 184 lb 8 oz (83.7 kg)   SpO2 94%   BMI 31.67 kg/m    General: Well developed, well nourished Caucasian female appearing in no acute distress. Nasal cannula in place. Head: Normocephalic, atraumatic, sclera non-icteric, no xanthomas, nares are without discharge.  Neck: No carotid bruits. JVD not elevated.  Lungs: Respirations regular and unlabored, without wheezes or rales. Rhonchi along upper lung fields. Heart: Regular rate and rhythm. No S3 or S4.  No murmur, no rubs, or gallops appreciated. Abdomen: Soft, non-tender, non-distended with normoactive bowel sounds. No hepatomegaly. No rebound/guarding. No obvious abdominal masses. Msk:  Strength and tone appear normal for age. No joint deformities or effusions. Extremities: No clubbing or cyanosis. No lower extremity edema.  Distal pedal pulses are 2+ bilaterally. Neuro: Alert and oriented X 3. Moves all extremities spontaneously. No focal deficits noted. Psych:  Responds to questions appropriately with a normal affect. Skin: No rashes or lesions noted  Wt Readings from Last 3 Encounters:  02/24/17 184 lb 8 oz (83.7 kg)  02/12/17 193 lb 8 oz (87.8 kg)  02/08/17 196 lb (88.9 kg)     Studies/Labs Reviewed:   EKG:  EKG is not ordered today. EKG from 02/10/2017 reviewed which showed NSR, HR 95, with PAC's.   Recent Labs: 12/09/2016: ALT 17 02/11/2017: Magnesium 1.8 02/12/2017: B Natriuretic Peptide 523.2; BUN 17; Creatinine, Ser 0.99; Hemoglobin 9.8; Platelets 224; Potassium 4.0; Sodium 135   Lipid Panel    Component Value Date/Time   CHOL 207 (H) 12/09/2016 1328   TRIG 221 (H) 12/09/2016 1328   HDL 35 (L) 12/09/2016 1328   CHOLHDL 5.9 (H) 12/09/2016 1328   CHOLHDL 4.6 07/02/2016 0823   VLDL 25 07/02/2016 0823   LDLCALC 128 (H) 12/09/2016 1328    Additional studies/ records that were reviewed today include:   Echocardiogram: 02/09/2017 Study Conclusions  - Left ventricle: The cavity size was mildly  dilated. Wall   thickness was increased in a pattern of mild LVH. Systolic   function was severely reduced. The estimated ejection fraction   was in the range of 20% to 25%. Diffuse hypokinesis. Doppler   parameters are consistent with abnormal left ventricular  relaxation (grade 1 diastolic dysfunction). - Aortic valve: Valve area (Vmax): 1.4 cm^2. - Aortic root: The aortic root was mildly dilated. - Ascending aorta: The ascending aorta was mildly dilated. - Mitral valve: Calcified annulus. There was moderate   regurgitation. - Left atrium: The atrium was moderately dilated. - Right ventricle: Systolic function was moderately reduced. - Pulmonary arteries: Systolic pressure was mildly increased. - Pericardium, extracardiac: A trivial pericardial effusion was   identified.  Impressions:  - No subcostal views; severe global reduction in LV systolic   function; grade 1 diastolic dysfunction; mildly dilated aortic   root and ascending aorta; moderate MR; moderaet LAE; moderately   reduced RV function; mild TR; mildly elevated pulmonary pressure.   Assessment:    1. Chronic combined systolic and diastolic heart failure (HCC)   2. Cardiomyopathy, unspecified type ([place])   3. Pre-procedural laboratory examination   4. PAF (paroxysmal atrial fibrillation) ([place])   5. Essential hypertension   6. Chronic respiratory failure with hypoxia (HCC)   7. PATENT FORAMEN OVALE      Plan:   In order of problems listed above:  1. Chronic Combined Systolic and Diastolic CHF/ New Cardiomyopathy - recently admitted for worsening dyspnea on exertion and palpitations, noted to be in atrial fibrillation with RVR with echo showing a newly reduced EF of 20-25%. An outpatient right/left heart cath was recommended once her respiratory status improved to rule out an ischemic etiology of her atrial fibrillation. - she has baseline dyspnea on exertion but also reports episodes of chest pain with exertion  over the past few months. Recent EKG was without acute ischemic changes.  - discussed with Dr. Claiborne Billings (DOD). Will plan for cardiac catheterization next week. The patient understands that risks include but are not limited to stroke (1 in 1000), death (1 in 45), kidney failure [usually temporary] (1 in 500), bleeding (1 in 200), allergic reaction [possibly serious] (1 in 200). Check labs today. Patient is unsure if she has received contrast in the past but reports many allergies. Will pre-treat for contrast allergy the day of the procedure. - I reached out to Dr. Martinique in regards to her needing a Lovenox bridge in the setting of her PAF and prior stroke. The patient was initially hesitant to do this but is aware she would be of increased stroke risk if she is not bridged while Coumadin is held. Pharmacy is aware of the need for Lovenox bridge. Will order Home Health to assist with injections at the request of the patient's family.  - continue BB, ARB, and statin. No ASA secondary to need for Coumadin. She is only taking Lasix 40mg  daily at this time. Will check BMET to assess creatinine and K+ as she is not currently on K+ supplementation.   2. Paroxysmal Atrial Fibrillation - This patients CHA2DS2-VASc Score and unadjusted Ischemic Stroke Rate (% per year) is equal to 9.7 % stroke rate/year from a score of 6 (CHF, HTN, Female, Age, CVA (2)). She denies any evidence of active bleeding. Continue Coumadin for anticoagulation. Will need Lovenox bridge for cath as above.  - continue Coreg 3.125mg  BID for rate-control. Unable to further titrate secondary to hypotension.   3. HTN - has actually been hypotensive at home and BP is soft at 94/58 today. - continue Coreg 3.125mg  BID. Reduce Losartan from 25mg  daily to 12.5mg  daily  4. COPD - followed by Pulmonology. Remains on 2L [place].   5. PFO - no plans for closure at this time.  Medication Adjustments/Labs and Tests Ordered: Current medicines are  reviewed at length with the patient today.  Concerns regarding medicines are outlined above.  Medication changes, Labs and Tests ordered today are listed in the Patient Instructions below. Patient Instructions  Medication Instructions:  DECREASE losartan to 12.5mg  daily (1/2 tablet)  Labwork: Please have labwork TODAY (PT/INR, BMET, CBC diff) in preparation for your cardiac catherization.  -[address]  Testing/Procedures: Your physician has requested that you have a cardiac catheterization. Cardiac catheterization is used to diagnose and/or treat various heart conditions. Doctors may recommend this procedure for a number of different reasons. The most common reason is to evaluate chest pain. Chest pain can be a symptom of coronary artery disease (CAD), and cardiac catheterization can show whether plaque is narrowing or blocking your heart's arteries. This procedure is also used to evaluate the valves, as well as measure the blood flow and oxygen levels in different parts of your heart. For further information please visit [email or web address]. Please follow instruction sheet, as given.  Follow-Up: Your physician recommends that you schedule a follow-up appointment in: Dr. Martinique after cath.  Any Other Special Instructions Will Be Listed Below (If Applicable).  If you need a refill on your cardiac medications before your next appointment, please call your pharmacy.   [facility] [address] [place] [address] Dept: [phone] Loc: [place]  02/24/2017  You are scheduled for a Cardiac Catheterization on Thursday, May 3 at 12:00 PM with Dr. Peter Martinique.  1. Please arrive at the [facility] (Main Entrance A) at [facility]: [place], [place] [address] at 10:00 AM (two hours before your procedure to ensure your preparation). Free valet  parking service is available.   Special note: Every effort is made to have your procedure done on time. Please understand that emergencies sometimes delay scheduled procedures.  2. Diet: Do not eat or drink anything after midnight prior to your procedure except sips of water to take medications.  3. Labs: You will need to have blood drawn on Wednesday, April 25 at [facility]  Open: [place] (Lunch 12:30 - 1:30)   Phone: [phone]. You do not need to be fasting.  4. Medication instructions in preparation for your procedure:  Stop taking Coumadin (Warfarin) on Saturday, April 28.  Bernerd Pho PA will speak to Dr. Martinique in regards to a Lovenox bridge.    On the morning of your procedure take any morning medicines NOT listed above.  You may use sips of water.  5. Plan for one night stay--bring personal belongings. 6. Bring a current list of your medications and current insurance cards. 7. You MUST have a responsible person to drive you home. 8. Someone MUST be with you the first 24 hours after you arrive home or your discharge will be delayed. 9. Please wear clothes that are easy to get on and off and wear slip-on shoes.  Thank you for allowing [place] to care for you!   -- [facility]     Signed, Waynetta Pean  02/24/2017 7:25 PM    [place], [place], [place]  [address] Phone: [phone]; Fax: [phone]  [address] [place], [place] [address] Phone: [phone]

## 2017-03-04 NOTE — Discharge Instructions (Addendum)
You may resume Coumadin tonight. Resume bridging Lovenox tomorrow. We will arrange follow up office visit and follow up in the coumadin clinic.  Radial Site Care Refer to this sheet in the next few weeks. These instructions provide you with information about caring for yourself after your procedure. Your health care provider may also give you more specific instructions. Your treatment has been planned according to current medical practices, but problems sometimes occur. Call your health care provider if you have any problems or questions after your procedure. What can I expect after the procedure? After your procedure, it is typical to have the following:  Bruising at the radial site that usually fades within 1-2 weeks.  Blood collecting in the tissue (hematoma) that may be painful to the touch. It should usually decrease in size and tenderness within 1-2 weeks. Follow these instructions at home:  Take medicines only as directed by your health care provider.  You may shower 24-48 hours after the procedure or as directed by your health care provider. Remove the bandage (dressing) and gently wash the site with plain soap and water. Pat the area dry with a clean towel. Do not rub the site, because this may cause bleeding.  Do not take baths, swim, or use a hot tub until your health care provider approves.  Check your insertion site every day for redness, swelling, or drainage.  Do not apply powder or lotion to the site.  Do not flex or bend the affected arm for 24 hours or as directed by your health care provider.  Do not push or pull heavy objects with the affected arm for 24 hours or as directed by your health care provider.  Do not lift over 10 lb (4.5 kg) for 5 days after your procedure or as directed by your health care provider.  Ask your health care provider when it is okay to:  Return to work or school.  Resume usual physical activities or sports.  Resume sexual activity.  Do  not drive home if you are discharged the same day as the procedure. Have someone else drive you.  You may drive 24 hours after the procedure unless otherwise instructed by your health care provider.  Do not operate machinery or power tools for 24 hours after the procedure.  If your procedure was done as an outpatient procedure, which means that you went home the same day as your procedure, a responsible adult should be with you for the first 24 hours after you arrive home.  Keep all follow-up visits as directed by your health care provider. This is important. Contact a health care provider if:  You have a fever.  You have chills.  You have increased bleeding from the radial site. Hold pressure on the site. Get help right away if:  You have unusual pain at the radial site.  You have redness, warmth, or swelling at the radial site.  You have drainage (other than a small amount of blood on the dressing) from the radial site.  The radial site is bleeding, and the bleeding does not stop after 30 minutes of holding steady pressure on the site.  Your arm or hand becomes pale, cool, tingly, or numb. This information is not intended to replace advice given to you by your health care provider. Make sure you discuss any questions you have with your health care provider. Document Released: 11/21/2010 Document Revised: 03/26/2016 Document Reviewed: 05/07/2014 Elsevier Interactive Patient Education  2017 [employer].

## 2017-03-05 ENCOUNTER — Encounter (HOSPITAL_COMMUNITY): Payer: Self-pay | Admitting: Cardiology

## 2017-03-05 MED FILL — Heparin Sodium (Porcine) 2 Unit/ML in Sodium Chloride 0.9%: INTRAMUSCULAR | Qty: 500 | Status: AC

## 2017-03-10 ENCOUNTER — Ambulatory Visit (INDEPENDENT_AMBULATORY_CARE_PROVIDER_SITE_OTHER): Payer: Medicare Other | Admitting: Pharmacist Clinician (PhC)/ Clinical Pharmacy Specialist

## 2017-03-10 DIAGNOSIS — Z7901 Long term (current) use of anticoagulants: Secondary | ICD-10-CM

## 2017-03-10 DIAGNOSIS — I480 Paroxysmal atrial fibrillation: Secondary | ICD-10-CM | POA: Diagnosis not present

## 2017-03-10 LAB — POCT INR: INR: 1.5

## 2017-03-10 MED ORDER — WARFARIN SODIUM 5 MG PO TABS: ORAL_TABLET | ORAL | 1 refills | Status: DC

## 2017-03-11 ENCOUNTER — Encounter: Payer: Self-pay | Admitting: *Deleted

## 2017-03-15 ENCOUNTER — Other Ambulatory Visit: Payer: Self-pay | Admitting: Cardiology

## 2017-03-15 NOTE — Telephone Encounter (Signed)
New message   She has been out of medication for 2 days  Pt calling again , pharmacy states they never received prescription    Needs medication called into [facility] on [address]   30 day supply  carvedilol (COREG) 3.125 MG tablet Take 1 tablet (3.125 mg total) by mouth 2 (two) times daily with a meal.

## 2017-03-15 NOTE — Telephone Encounter (Signed)
°*  STAT* If patient is at the pharmacy, call can be transferred to refill team.   1. Which medications need to be refilled? (please list name of each medication and dose if known) Losartan and Carvedilol  2. Which pharmacy/location (including street and city if local pharmacy) is medication to be sent to?[phone]  3. Do they need a 30 day or 90 day supply? 30-Losartan and Carvedilol-60

## 2017-03-15 NOTE — Telephone Encounter (Signed)
Follow up     Patient calling making sure that refill medication was sent through.

## 2017-03-19 ENCOUNTER — Ambulatory Visit (INDEPENDENT_AMBULATORY_CARE_PROVIDER_SITE_OTHER): Payer: Medicare Other | Admitting: Pharmacist Clinician (PhC)/ Clinical Pharmacy Specialist

## 2017-03-19 DIAGNOSIS — Z7901 Long term (current) use of anticoagulants: Secondary | ICD-10-CM

## 2017-03-19 DIAGNOSIS — I480 Paroxysmal atrial fibrillation: Secondary | ICD-10-CM | POA: Diagnosis not present

## 2017-03-19 LAB — POCT INR: INR: 2.3

## 2017-03-22 ENCOUNTER — Telehealth (HOSPITAL_COMMUNITY): Payer: Self-pay

## 2017-03-22 DIAGNOSIS — F0630 Mood disorder due to known physiological condition, unspecified: Secondary | ICD-10-CM

## 2017-03-22 NOTE — Telephone Encounter (Signed)
Medication refill request - Fax received requesting a 90 day order for patient's prescribed Trazodone, last ordered for 30 days on 01/13/17 + 2 refills and pt. returns on 04/15/17.

## 2017-03-23 MED ORDER — TRAZODONE HCL 100 MG PO TABS: 100.0000 mg | ORAL_TABLET | Freq: Every day | ORAL | 0 refills | Status: DC

## 2017-03-23 NOTE — Telephone Encounter (Signed)
Sent in the 90 day order

## 2017-03-23 NOTE — Telephone Encounter (Signed)
Okay to prescribe 90 day supply

## 2017-04-02 NOTE — Progress Notes (Signed)
Cardiology Office Note    Date:  04/05/2017   ID:  Laurie Horton, Laurie Horton [birth year], MRN [id number]  PCP:  Wardell Honour, MD  Cardiologist: Dr. Martinique   Chief Complaint  Patient presents with  . Follow-up    4 months;  . Congestive Heart Failure  . Coronary Artery Disease    History of Present Illness:    Laurie Horton is a 71 y.o. female with past medical history of chronic diastolic CHF, PFO, PAF (on Coumadin), COPD (on 2L [place] at baseline), HTN, and prior CVA who is seen for follow up CHF.  She was admitted from 4/9 - 02/12/2017 for worsening dyspnea on exertion and palpitations. Was in atrial fibrillation with RVR upon arrival to the ED. Echo during admission showed a newly reduced EF of 20-25% and she was diuresed with IV Lasix. Was followed by Cardiology and it was thought her cardiomyopathy was possibly of a Takatsubo etiology as she denied any recent anginal symptoms with enzymes being negative and EKG showing no acute ischemic changes. It was recommended to consider a right/left heart cath in 4-6 weeks. Weight was 193 lbs at time of discharge and she was continued on Lasix 40mg  BID along with Coreg 3.125mg  BID and Losartan 12.5 mg daily.  She did undergo right and left heart cath on 03/04/17. This showed severe 2 vessel obstructive CAD with 100% RCA occlusion, 75% OM1, and 90% small OM2. EF 25-30%. Mild pulmonary HTN with normal LV filling pressures. 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 Maximizing CHF therapy recommended. At time of cath it appeared she had more of a multifocal atrial arrhythmia.  On follow up today she is doing very well. Breathing has improved. No increase in edema. Some weight fluctuation. Fingers and toes stay cold. BP running low generally. She reports good compliance with her medication regimen and has been keeping a very detailed log of her sodium and fluid intake.  She states her legs give way at times but this may be related to chronic back  problems.    Past Medical History:  Diagnosis Date  . Complication of anesthesia    various issues with oxygen  saturations post op  . COPD (chronic obstructive pulmonary disease) ([place])   . Depression   . Diverticulitis   . DVT (deep venous thrombosis) ([place])   . Fibromyalgia   . GERD (gastroesophageal reflux disease)   . Hiatal hernia   . History of deviated nasal septum    left- side  . HTN (hypertension)   . Hyperlipidemia   . Obesity   . On supplemental oxygen therapy    concentrator at night @ 1.5 l/m or when sleeps. O2 Sat niormally 87.  . Osteoporosis   . PAT (paroxysmal atrial tachycardia) ([place])   . PFO (patent foramen ovale)   . PULMONARY NODULE, LEFT LOWER LOBE 10/14/2009   75mm LLL nodule dec 2010. Stable and 97mm in Oct 2012. No further fu  . Right middle lobe pneumonia ([place]) 07/24/2011   First noted at admit 07/10/11. Persists on cxr 07/22/11. Cleared on CT 08/24/11. No further followup  . Stroke ([place])   . TOBACCO ABUSE 06/04/2009    Past Surgical History:  Procedure Laterality Date  . CARDIOVASCULAR STRESS TEST  12/26/2004   EF 74%. NO EVIDENCE OF ISCHEMIA  . ESOPHAGOGASTRODUODENOSCOPY (EGD) WITH PROPOFOL N/A 04/15/2015   Procedure: ESOPHAGOGASTRODUODENOSCOPY (EGD) WITH PROPOFOL;  Surgeon: Laurence Spates, MD;  Location: [facility];  Service: Endoscopy;  Laterality: N/A;  .  KNEE ARTHROSCOPY  2000   left  . LAPAROSCOPIC CHOLECYSTECTOMY  04-16-2010   [facility]  . MOUTH SURGERY     03-26-15 multiple extractions stitches remains  . RIGHT/LEFT HEART CATH AND CORONARY ANGIOGRAPHY N/A 03/04/2017   Procedure: Right/Left Heart Cath and Coronary Angiography;  Surgeon: Peter M Martinique, MD;  Location: [facility];  Service: Cardiovascular;  Laterality: N/A;  . TOTAL ABDOMINAL HYSTERECTOMY     post op needed oxygen was told "she gave them a scare"  . TUBAL LIGATION    . US ECHOCARDIOGRAPHY  11/20/2009   EF 55-60%    Current Medications: Outpatient Medications Prior to Visit   Medication Sig Dispense Refill  . carvedilol (COREG) 3.125 MG tablet TAKE 1 TABLET BY MOUTH TWICE A DAY WITH A MEAL 60 tablet 0  . clindamycin (CLEOCIN) 300 MG capsule Take 300-600 mg by mouth See admin instructions. TAKE 2 CAPSULES RIGHT AWAY THEN 1 EVERY 6 HOURS AFTER DENTAL PROCEDURE    . desvenlafaxine (PRISTIQ) 50 MG 24 hr tablet Take 1 tablet (50 mg total) by mouth daily. 30 tablet 2  . diazepam (VALIUM) 2 MG tablet Take 2 mg by mouth 2 (two) times daily as needed for anxiety.     . gabapentin (NEURONTIN) 100 MG capsule Take 100-200 mg by mouth 3 (three) times daily. 100 mg in am, 100 mg at noon and  200 mg at bedtime    . HYDROcodone-acetaminophen (NORCO/VICODIN) 5-325 MG tablet Take 1 tablet by mouth 2 (two) times daily.     Marland Kitchen ipratropium (ATROVENT) 0.02 % nebulizer solution USE 1 VIAL BY NEBULIZATION 4 (FOUR) TIMES DAILY. (Patient taking differently: USE 1 VIAL BY NEBULIZATION 4 (FOUR) TIMES DAILY AS NEEDED FOR WHEEZING) 75 mL 3  . losartan (COZAAR) 25 MG tablet Take 0.5 tablets (12.5 mg total) by mouth daily. 30 tablet 0  . montelukast (SINGULAIR) 10 MG tablet Take 1 tablet (10 mg total) by mouth at bedtime. (Patient taking differently: Take 10 mg by mouth daily. ) 90 tablet 0  . ondansetron (ZOFRAN ODT) 8 MG disintegrating tablet Take 1 tablet (8 mg total) by mouth every 8 (eight) hours as needed for nausea or vomiting. 20 tablet 0  . oxybutynin (DITROPAN-XL) 10 MG 24 hr tablet Take 10 mg by mouth daily.     . pantoprazole (PROTONIX) 40 MG tablet Take 1 tablet by mouth every morning.     . polyethylene glycol (MIRALAX / GLYCOLAX) packet Take by mouth daily as needed for mild constipation. TAKES TEASPOONFUL IN 8 OZ OF WATER    . rosuvastatin (CRESTOR) 5 MG tablet Take 5 mg by mouth once a week. Sunday  3  . sodium chloride (OCEAN) 0.65 % SOLN nasal spray Place 1 spray into the nose 3 (three) times daily as needed for congestion. Only applies in the clogged nostril    . tiZANidine  (ZANAFLEX) 2 MG tablet Take 1-2 mg by mouth 3 (three) times daily as needed for muscle spasms. Takes a 1/2-1 tablet depending on pain level    . traZODone (DESYREL) 100 MG tablet Take 1 tablet (100 mg total) by mouth at bedtime. 90 tablet 0  . triamcinolone ointment (KENALOG) 0.1 % Apply 1 application topically 3 (three) times daily as needed (EAR IRRITATION).     Marland Kitchen warfarin (COUMADIN) 5 MG tablet TAKE 1/2 TO 1 TABLET BY MOUTH EVERY DAY OR AS DIRECTED BY COUMADIN CLINIC (FILLABLE 09/02/16) 30 tablet 1  . furosemide (LASIX) 40 MG tablet TAKE 1  TABLET BY MOUTH TWICE A DAY 60 tablet 11  . losartan (COZAAR) 25 MG tablet TAKE 1 TABLET BY MOUTH EVERY DAY 30 tablet 0   No facility-administered medications prior to visit.      Allergies:   Alprazolam; Bee venom; Iodine; Pseudoephedrine hcl er; Budesonide-formoterol fumarate; Crestor [rosuvastatin calcium]; Esomeprazole magnesium; Flonase [fluticasone propionate]; Loratadine; Lotrimin [clotrimazole]; Lunesta [eszopiclone]; Oxcarbazepine; Statins; Zolpidem tartrate; Clarithromycin; Aciphex [rabeprazole sodium]; Avelox [moxifloxacin hcl in nacl]; Bextra [valdecoxib]; Ceclor [cefaclor]; Cephalexin; Covera-hs [verapamil hcl]; Dicyclomine hcl; Other; and Tessalon perles   Social History   Social History  . Marital status: Divorced    Spouse name: N/A  . Number of children: 2  . Years of education: N/A   Occupational History  . disability    Social History Main Topics  . Smoking status: Former Smoker    Quit date: 06/02/2010  . Smokeless tobacco: Never Used  . Alcohol use No  . Drug use: No  . Sexual activity: Yes   Other Topics Concern  . None   Social History Narrative  . None     Family History:  The patient's family history includes Alzheimer's disease in her mother; Dementia in her mother; Diabetes in her mother and sister; Heart attack in her father; Heart attack (age of onset: 59) in her brother; Schizophrenia in her sister; Tremor in her  sister.   Review of Systems:   As noted in HPI.  All other systems reviewed and are otherwise negative except as noted above.   Physical Exam:    VS:  BP 115/68   Pulse 81   Wt 185 lb 9.6 oz (84.2 kg)   BMI 31.86 kg/m    General: Well developed, well nourished Caucasian female appearing in no acute distress.  Head: Normocephalic, atraumatic, sclera non-icteric, no xanthomas, nares are without discharge.  Neck: No carotid bruits. JVD not elevated.  Lungs: Clear Heart: Regular rate and rhythm. No S3 or S4.  No murmur, no rubs, or gallops appreciated. Abdomen: Soft, non-tender, non-distended with normoactive bowel sounds. No hepatomegaly. No rebound/guarding. No obvious abdominal masses. Msk:  Strength and tone appear normal for age. No joint deformities or effusions. Extremities: No clubbing or cyanosis. No lower extremity edema.  Distal pedal pulses are 2+ bilaterally. Neuro: Alert and oriented X 3. Moves all extremities spontaneously. No focal deficits noted. Psych:  Responds to questions appropriately with a normal affect. Skin: No rashes or lesions noted  Wt Readings from Last 3 Encounters:  04/05/17 185 lb 9.6 oz (84.2 kg)  03/04/17 180 lb 3 oz (81.7 kg)  02/24/17 184 lb 8 oz (83.7 kg)     Studies/Labs Reviewed:   EKG:  EKG is not ordered today. EKG from 02/10/2017 reviewed which showed NSR, HR 95, with PAC's.   Recent Labs: 12/09/2016: ALT 17 02/11/2017: Magnesium 1.8 02/12/2017: B Natriuretic Peptide 523.2; Hemoglobin 9.8 02/24/2017: BUN 17; Creatinine, Ser 1.03; Platelets 272; Potassium 4.4; Sodium 140   Lipid Panel    Component Value Date/Time   CHOL 207 (H) 12/09/2016 1328   TRIG 221 (H) 12/09/2016 1328   HDL 35 (L) 12/09/2016 1328   CHOLHDL 5.9 (H) 12/09/2016 1328   CHOLHDL 4.6 07/02/2016 0823   VLDL 25 07/02/2016 0823   LDLCALC 128 (H) 12/09/2016 1328    Additional studies/ records that were reviewed today include:   Echocardiogram: 02/09/2017 Study  Conclusions  - Left ventricle: The cavity size was mildly dilated. Wall   thickness was increased in a pattern of  mild LVH. Systolic   function was severely reduced. The estimated ejection fraction   was in the range of 20% to 25%. Diffuse hypokinesis. Doppler   parameters are consistent with abnormal left ventricular   relaxation (grade 1 diastolic dysfunction). - Aortic valve: Valve area (Vmax): 1.4 cm^2. - Aortic root: The aortic root was mildly dilated. - Ascending aorta: The ascending aorta was mildly dilated. - Mitral valve: Calcified annulus. There was moderate   regurgitation. - Left atrium: The atrium was moderately dilated. - Right ventricle: Systolic function was moderately reduced. - Pulmonary arteries: Systolic pressure was mildly increased. - Pericardium, extracardiac: A trivial pericardial effusion was   identified.  Impressions:  - No subcostal views; severe global reduction in LV systolic   function; grade 1 diastolic dysfunction; mildly dilated aortic   root and ascending aorta; moderate MR; moderaet LAE; moderately   reduced RV function; mild TR; mildly elevated pulmonary pressure.  Procedures   Right/Left Heart Cath and Coronary Angiography  Conclusion     Ost 1st Mrg to 1st Mrg lesion, 75 %stenosed.  2nd Mrg lesion, 90 %stenosed.  Ost RCA to Mid RCA lesion, 100 %stenosed.  There is severe left ventricular systolic dysfunction.  LV end diastolic pressure is normal.  The left ventricular ejection fraction is 25-35% by visual estimate.  Hemodynamic findings consistent with mild pulmonary hypertension.  LV end diastolic pressure is normal.   1. Severe 2 vessel obstructive CAD    - 75% proximal OM1    - 90% small OM2    - 100% proximal RCA. Left to right collaterals.  2. Severe LV dysfunction 3. Mild pulmonary HTN with normal LV filling pressures.  4. Cardiac index 2.41 L/min/BSA   Plan: Medical management to try and optimize CHF  therapy. Patient appears to be adequately diuresed at this time. Based on these results her cardiomyopathy is ischemic. I would treat her CAD medically. If cardiac cath is needed in the future would consider alternative access given difficulty from the right radial approach. Her rhythm during procedure is a multifocal atrial rhythm/tachycardia.       Assessment:    1. Chronic combined systolic and diastolic heart failure ([place])   2. PAF (paroxysmal atrial fibrillation) ([place])   3. PATENT FORAMEN OVALE   4. Coronary artery disease involving native coronary artery of native heart without angina pectoris   5. Ischemic cardiomyopathy      Plan:   In order of problems listed above:  1. Chronic Combined Systolic and Diastolic CHF/ New Cardiomyopathy - EF 25-30% with ischemic cardiomyopathy. Not a candidate for revascularization.  - she appears to be euvolemic at this time. - continue BB, ARB, and statin. Titrate meds as tolerated for maximal effect. This is limited by low BP.  Will reduce lasix to 40 mg daily and add aldactone 12.5 mg daily. Check BMET in 3 days. Follow up in 2-3 weeks and continue to titrate meds slowly as BP allows.   2. Paroxysmal Atrial Fibrillation/ multifocal atrial tachycardia - This patients CHA2DS2-VASc Score and unadjusted Ischemic Stroke Rate (% per year) is equal to 9.7 % stroke rate/year from a score of 6 (CHF, HTN, Female, Age, CVA (2)). She denies any evidence of active bleeding. Continue Coumadin for anticoagulation. INR today 2.0 - continue Coreg 3.125mg  BID for rate-control. Unable to further titrate secondary to hypotension.   3. HTN - BP now low.  4. COPD - followed by Pulmonology.   5. PFO - no plans for closure at  this time.     Signed, Peter Martinique, MD  04/05/2017 12:17 PM    [facility] [place], [place] [place], [place]  [address] Phone: [phone]; Fax: [phone]  [address] [place]  [place], [place] [address] Phone: [phone]

## 2017-04-05 ENCOUNTER — Ambulatory Visit (INDEPENDENT_AMBULATORY_CARE_PROVIDER_SITE_OTHER): Payer: Medicare Other | Admitting: Cardiology

## 2017-04-05 ENCOUNTER — Ambulatory Visit (INDEPENDENT_AMBULATORY_CARE_PROVIDER_SITE_OTHER): Payer: Medicare Other | Admitting: Pharmacist Clinician (PhC)/ Clinical Pharmacy Specialist

## 2017-04-05 ENCOUNTER — Encounter: Payer: Self-pay | Admitting: Cardiology

## 2017-04-05 VITALS — BP 115/68 | HR 81 | Wt 185.6 lb

## 2017-04-05 DIAGNOSIS — Q211 Atrial septal defect: Secondary | ICD-10-CM

## 2017-04-05 DIAGNOSIS — I480 Paroxysmal atrial fibrillation: Secondary | ICD-10-CM

## 2017-04-05 DIAGNOSIS — I2510 Atherosclerotic heart disease of native coronary artery without angina pectoris: Secondary | ICD-10-CM | POA: Diagnosis not present

## 2017-04-05 DIAGNOSIS — I5042 Chronic combined systolic (congestive) and diastolic (congestive) heart failure: Secondary | ICD-10-CM | POA: Diagnosis not present

## 2017-04-05 DIAGNOSIS — I255 Ischemic cardiomyopathy: Secondary | ICD-10-CM | POA: Diagnosis not present

## 2017-04-05 DIAGNOSIS — Z7901 Long term (current) use of anticoagulants: Secondary | ICD-10-CM

## 2017-04-05 DIAGNOSIS — Q2111 Secundum atrial septal defect: Secondary | ICD-10-CM

## 2017-04-05 LAB — POCT INR: INR: 2

## 2017-04-05 MED ORDER — FUROSEMIDE 40 MG PO TABS: 40.0000 mg | ORAL_TABLET | Freq: Every day | ORAL | 11 refills | Status: DC

## 2017-04-05 MED ORDER — SPIRONOLACTONE 25 MG PO TABS: 12.5000 mg | ORAL_TABLET | Freq: Every day | ORAL | 11 refills | Status: DC

## 2017-04-05 NOTE — Patient Instructions (Signed)
Reduce lasix to 40 mg daily  Add aldactone 12.5 mg daily  We will check a blood test in 3 days.   I will see you in 2-3 weeks.

## 2017-04-08 LAB — BASIC METABOLIC PANEL
BUN/Creatinine Ratio: 15 (ref 12–28)
BUN: 11 mg/dL (ref 8–27)
CALCIUM: 9.8 mg/dL (ref 8.7–10.3)
CHLORIDE: 94 mmol/L — AB (ref 96–106)
CO2: 30 mmol/L — ABNORMAL HIGH (ref 18–29)
Creatinine, Ser: 0.71 mg/dL (ref 0.57–1.00)
GFR calc Af Amer: 99 mL/min/{1.73_m2} (ref >59)
GFR calc non Af Amer: 86 mL/min/{1.73_m2} (ref >59)
GLUCOSE: 138 mg/dL — AB (ref 65–99)
Potassium: 4.4 mmol/L (ref 3.5–5.2)
Sodium: 138 mmol/L (ref 134–144)

## 2017-04-09 ENCOUNTER — Telehealth: Payer: Self-pay | Admitting: Student

## 2017-04-09 NOTE — Telephone Encounter (Signed)
New message    Pt is returning call to [facility].

## 2017-04-09 NOTE — Telephone Encounter (Signed)
pt aware of results  

## 2017-04-13 ENCOUNTER — Telehealth: Payer: Self-pay | Admitting: Cardiology

## 2017-04-13 NOTE — Telephone Encounter (Signed)
pt aware of results  

## 2017-04-13 NOTE — Telephone Encounter (Signed)
Mrs.Brown is returning a call about her labs. Please call

## 2017-04-14 ENCOUNTER — Other Ambulatory Visit: Payer: Self-pay | Admitting: Cardiology

## 2017-04-14 ENCOUNTER — Encounter: Payer: Medicare Other | Admitting: Family Medicine

## 2017-04-15 ENCOUNTER — Ambulatory Visit (INDEPENDENT_AMBULATORY_CARE_PROVIDER_SITE_OTHER): Payer: Medicare Other | Admitting: Psychiatry

## 2017-04-15 ENCOUNTER — Telehealth (HOSPITAL_COMMUNITY): Payer: Self-pay

## 2017-04-15 ENCOUNTER — Encounter (HOSPITAL_COMMUNITY): Payer: Self-pay | Admitting: Psychiatry

## 2017-04-15 DIAGNOSIS — Z818 Family history of other mental and behavioral disorders: Secondary | ICD-10-CM

## 2017-04-15 DIAGNOSIS — F411 Generalized anxiety disorder: Secondary | ICD-10-CM | POA: Diagnosis not present

## 2017-04-15 DIAGNOSIS — Z87891 Personal history of nicotine dependence: Secondary | ICD-10-CM | POA: Diagnosis not present

## 2017-04-15 DIAGNOSIS — F4310 Post-traumatic stress disorder, unspecified: Secondary | ICD-10-CM | POA: Diagnosis not present

## 2017-04-15 DIAGNOSIS — F0630 Mood disorder due to known physiological condition, unspecified: Secondary | ICD-10-CM | POA: Diagnosis not present

## 2017-04-15 DIAGNOSIS — F332 Major depressive disorder, recurrent severe without psychotic features: Secondary | ICD-10-CM | POA: Diagnosis not present

## 2017-04-15 DIAGNOSIS — Z810 Family history of intellectual disabilities: Secondary | ICD-10-CM | POA: Diagnosis not present

## 2017-04-15 MED ORDER — DESVENLAFAXINE SUCCINATE ER 100 MG PO TB24: 100.0000 mg | ORAL_TABLET | Freq: Every day | ORAL | 0 refills | Status: DC

## 2017-04-15 NOTE — Telephone Encounter (Signed)
We increased the dose to 100 mg and patient will call us if she is feeling better or wants to try a different medication.  We will switch to 90 day supply after 30 days if needed.

## 2017-04-15 NOTE — Progress Notes (Signed)
[facility] MD/PA/NP OP Progress Note  04/15/2017 10:15 AM Laurie Horton  MRN:  [id number]  Chief Complaint:  Chief Complaint    Follow-up     Subjective:  I have no energy.  My depression is getting worse.  HPI: Patient came for her follow-up appointment.  She is complaining of body pain, worsening depression, poor sleep and disappointed because her cardiologist did not want to do anymore intervention.  Patient told she was told that she has a stiff heart and there is nothing they can do about it.  She feels very tired and has low energy.  She lives by herself in 2 story house and now she is thinking to sell the house to move into one level house.  She is upset on her daughter who lives in [place] who does not come to help her.  Patient admitted there are days when she does not clean the house because she has no energy.  She was admitted in April due to CHF.  Sometimes she gets nervous and anxious that how long she will live but she denies any suicidal thoughts.  She is taking medication.  She's also taking pain medication and muscle relaxant.  She does not want to change her antidepressant.  She is taking Pristiq 50 mg and trazodone 100 mg.  She endorses there are times when she feels very isolated, withdrawn and having crying spells.  However she denies any paranoia, hallucination or any aggressive behavior.  She is unable to afford counseling.  Patient denies drinking or using any illegal substances.  Today she is complaining of back pain because she supposed to get injection from Dr. Florene Glen but she is out of control for 2 weeks.  Patient denies any mania or any nightmares.  She has no tremors or shakes.  Visit Diagnosis:    ICD-10-CM   1. 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 Patient denies any previous history of psychiatric inpatient treatment, suicidal attempt, paranoia, hallucination or any mania. She  started seeing psychiatrist in 2006 after she had a stroke. She had tried Cymbalta and Celexa in the past. She also tried Ambien, Lunesta and Xanax but developed allergies and side effects.  Past Medical History:  Past Medical History:  Diagnosis Date  . Complication of anesthesia    various issues with oxygen  saturations post op  . COPD (chronic obstructive pulmonary disease) ([place])   . Depression   . Diverticulitis   . DVT (deep venous thrombosis) ([place])   . Fibromyalgia   . GERD (gastroesophageal reflux disease)   . Hiatal hernia   . History of deviated nasal septum    left- side  . HTN (hypertension)   . Hyperlipidemia   . Obesity   . On supplemental oxygen therapy    concentrator at night @ 1.5 l/m or when sleeps. O2 Sat niormally 87.  . Osteoporosis   . PAT (paroxysmal atrial tachycardia) ([place])   . PFO (patent foramen ovale)   . PULMONARY NODULE, LEFT LOWER LOBE 10/14/2009   78m LLL nodule dec 2010. Stable and 460min Oct 2012. No further fu  . Right middle lobe pneumonia ([facility]/21/2012   First noted at admit 07/10/11. Persists on cxr 07/22/11. Cleared on CT 08/24/11. No further followup  . Stroke ([facility]  . TOBACCO ABUSE 06/04/2009    Past Surgical History:  Procedure Laterality Date  . CARDIOVASCULAR STRESS TEST  12/26/2004   EF 74%. NO EVIDENCE OF ISCHEMIA  . ESOPHAGOGASTRODUODENOSCOPY (EGD) WITH PROPOFOL N/A 04/15/2015   Procedure: ESOPHAGOGASTRODUODENOSCOPY (EGD) WITH PROPOFOL;  Surgeon: Laurence Spates, MD;  Location: [facility];  Service: Endoscopy;  Laterality: N/A;  . KNEE ARTHROSCOPY  2000   left  . LAPAROSCOPIC CHOLECYSTECTOMY  04-16-2010   [place]  . MOUTH SURGERY     03-26-15 multiple extractions stitches remains  . RIGHT/LEFT HEART CATH AND CORONARY ANGIOGRAPHY N/A 03/04/2017   Procedure: Right/Left Heart Cath and Coronary Angiography;  Surgeon: Peter M Martinique, MD;  Location: [facility];  Service: Cardiovascular;  Laterality: N/A;  . TOTAL ABDOMINAL  HYSTERECTOMY     post op needed oxygen was told "she gave them a scare"  . TUBAL LIGATION    . US ECHOCARDIOGRAPHY  11/20/2009   EF 55-60%    Family Psychiatric History: Reviewed.  Family History:  Family History  Problem Relation Age of Onset  . Dementia Mother   . Diabetes Mother   . Alzheimer's disease Mother   . Heart attack Brother 73  . Heart attack Father   . Schizophrenia Sister   . Diabetes Sister   . Tremor Sister     Social History:  Social History   Social History  . Marital status: Divorced    Spouse name: N/A  . Number of children: 2  . Years of education: N/A   Occupational History  . disability    Social History Main Topics  . Smoking status: Former Smoker    Quit date: 06/02/2010  . Smokeless tobacco: Never Used  . Alcohol use No  . Drug use: No  . Sexual activity: Yes    Partners: Male    Birth control/ protection: None   Other Topics Concern  . None   Social History Narrative  . None    Allergies:  Allergies  Allergen Reactions  . Alprazolam Other (See Comments)    REACTION: stops breathing  . Bee Venom Anaphylaxis  . Iodine Anaphylaxis and Swelling    REACTION: swelling in throat  . Pseudoephedrine Hcl Er Shortness Of Breath  . Budesonide-Formoterol Fumarate Other (See Comments)    Blisters inside of mouth all over  . Crestor [Rosuvastatin Calcium] Other (See Comments)    Unable to walk  . Esomeprazole Magnesium Other (See Comments)    REACTION: "bouncing off walls"  . Flonase [Fluticasone Propionate] Other (See Comments)    NOSE BLEED  . Loratadine Other (See Comments)    claritin D causes shaking  . Lotrimin [Clotrimazole] Other (See Comments)    blisters  . Lunesta [Eszopiclone] Other (See Comments)    REACTION: "slept for a week"  . Oxcarbazepine Other (See Comments)    Causes deep sleep and dizziness  . Statins Other (See Comments)    Can't walk, legs won't work   . Zolpidem Tartrate Other (See Comments)    REACTION:  "slept for a week"  . Clarithromycin Other (See Comments)    All "mycins" Puts into "a" fib Will take if has to for severe sinus infection  . Aciphex [Rabeprazole Sodium] Rash  . Avelox [Moxifloxacin Hcl In Nacl] Other (See Comments)    Stomach cramps.   Darlin Coco [Valdecoxib] Rash  . Ceclor [Cefaclor] Rash  . Cephalexin Rash    Pt states that she is possibly allergic to this - had a reaction to Cefaclor in the past and she does not want to these class drugs. Added per patient request.  .  Covera-Hs [Verapamil Hcl] Palpitations  . Dicyclomine Hcl Rash  . Other Other (See Comments)    Glue from ekg/heart monitor leads --rash +  Any MYCINS  . Tessalon Perles Rash    Metabolic Disorder Labs: Recent Results (from the past 2160 hour(s))  POCT INR     Status: None   Collection Time: 01/25/17  3:43 PM  Result Value Ref Range   INR 2.5   Basic metabolic panel     Status: Abnormal   Collection Time: 02/08/17 12:33 PM  Result Value Ref Range   Sodium 138 135 - 145 mmol/L   Potassium 3.8 3.5 - 5.1 mmol/L   Chloride 103 101 - 111 mmol/L   CO2 27 22 - 32 mmol/L   Glucose, Bld 137 (H) 65 - 99 mg/dL   BUN 10 6 - 20 mg/dL   Creatinine, Ser 0.93 0.44 - 1.00 mg/dL   Calcium 9.2 8.9 - 10.3 mg/dL   GFR calc non Af Amer >60 >60 mL/min   GFR calc Af Amer >60 >60 mL/min    Comment: (NOTE) The eGFR has been calculated using the CKD EPI equation. This calculation has not been validated in all clinical situations. eGFR's persistently <60 mL/min signify possible Chronic Kidney Disease.    Anion gap 8 5 - 15  CBC     Status: Abnormal   Collection Time: 02/08/17 12:33 PM  Result Value Ref Range   WBC 6.3 4.0 - 10.5 K/uL   RBC 3.81 (L) 3.87 - 5.11 MIL/uL   Hemoglobin 10.3 (L) 12.0 - 15.0 g/dL   HCT 33.4 (L) 36.0 - 46.0 %   MCV 87.7 78.0 - 100.0 fL   MCH 27.0 26.0 - 34.0 pg   MCHC 30.8 30.0 - 36.0 g/dL   RDW 15.3 11.5 - 15.5 %   Platelets 239 150 - 400 K/uL  Protime-INR     Status: Abnormal    Collection Time: 02/08/17 12:33 PM  Result Value Ref Range   Prothrombin Time 24.8 (H) 11.4 - 15.2 seconds   INR 2.20   Brain natriuretic peptide     Status: Abnormal   Collection Time: 02/08/17 12:33 PM  Result Value Ref Range   B Natriuretic Peptide 685.9 (H) 0.0 - 100.0 pg/mL  Procalcitonin - Baseline     Status: None   Collection Time: 02/08/17 12:33 PM  Result Value Ref Range   Procalcitonin <0.10 ng/mL    Comment:        Interpretation: PCT (Procalcitonin) <= 0.5 ng/mL: Systemic infection (sepsis) is not likely. Local bacterial infection is possible. (NOTE)         ICU PCT Algorithm               Non ICU PCT Algorithm    ----------------------------     ------------------------------         PCT < 0.25 ng/mL                 PCT < 0.1 ng/mL     Stopping of antibiotics            Stopping of antibiotics       strongly encouraged.               strongly encouraged.    ----------------------------     ------------------------------       PCT level decrease by               PCT < 0.25 ng/mL       >=  80% from peak PCT       OR PCT 0.25 - 0.5 ng/mL          Stopping of antibiotics                                             encouraged.     Stopping of antibiotics           encouraged.    ----------------------------     ------------------------------       PCT level decrease by              PCT >= 0.25 ng/mL       < 80% from peak PCT        AND PCT >= 0.5 ng/mL            Continuin g antibiotics                                              encouraged.       Continuing antibiotics            encouraged.    ----------------------------     ------------------------------     PCT level increase compared          PCT > 0.5 ng/mL         with peak PCT AND          PCT >= 0.5 ng/mL             Escalation of antibiotics                                          strongly encouraged.      Escalation of antibiotics        strongly encouraged.   Magnesium     Status: None    Collection Time: 02/08/17 12:33 PM  Result Value Ref Range   Magnesium 1.8 1.7 - 2.4 mg/dL  I-stat troponin, ED     Status: None   Collection Time: 02/08/17 12:47 PM  Result Value Ref Range   Troponin i, poc 0.01 0.00 - 0.08 ng/mL   Comment 3            Comment: Due to the release kinetics of cTnI, a negative result within the first hours of the onset of symptoms does not rule out myocardial infarction with certainty. If myocardial infarction is still suspected, repeat the test at appropriate intervals.   I-Stat CG4 Lactic Acid, ED     Status: None   Collection Time: 02/08/17 12:50 PM  Result Value Ref Range   Lactic Acid, Venous 1.63 0.5 - 1.9 mmol/L  Protime-INR     Status: Abnormal   Collection Time: 02/09/17  5:23 AM  Result Value Ref Range   Prothrombin Time 21.9 (H) 11.4 - 15.2 seconds   INR 3.61   Basic metabolic panel     Status: Abnormal   Collection Time: 02/09/17  5:23 AM  Result Value Ref Range   Sodium 139 135 - 145 mmol/L   Potassium 4.0 3.5 - 5.1 mmol/L   Chloride 100 (L) 101 - 111 mmol/L   CO2 26  22 - 32 mmol/L   Glucose, Bld 112 (H) 65 - 99 mg/dL   BUN 9 6 - 20 mg/dL   Creatinine, Ser 0.99 0.44 - 1.00 mg/dL   Calcium 9.5 8.9 - 10.3 mg/dL   GFR calc non Af Amer 56 (L) >60 mL/min   GFR calc Af Amer >60 >60 mL/min    Comment: (NOTE) The eGFR has been calculated using the CKD EPI equation. This calculation has not been validated in all clinical situations. eGFR's persistently <60 mL/min signify possible Chronic Kidney Disease.    Anion gap 13 5 - 15  ECHOCARDIOGRAM COMPLETE     Status: None   Collection Time: 02/09/17 12:46 PM  Result Value Ref Range   Weight 3,046.4 oz   Height 64 in   BP 102/74 mmHg  Protime-INR     Status: Abnormal   Collection Time: 02/10/17  3:17 AM  Result Value Ref Range   Prothrombin Time 25.3 (H) 11.4 - 15.2 seconds   INR 7.82   Basic metabolic panel     Status: Abnormal   Collection Time: 02/10/17  3:17 AM  Result Value  Ref Range   Sodium 138 135 - 145 mmol/L   Potassium 3.7 3.5 - 5.1 mmol/L   Chloride 97 (L) 101 - 111 mmol/L   CO2 31 22 - 32 mmol/L   Glucose, Bld 116 (H) 65 - 99 mg/dL   BUN 15 6 - 20 mg/dL   Creatinine, Ser 1.01 (H) 0.44 - 1.00 mg/dL   Calcium 9.1 8.9 - 10.3 mg/dL   GFR calc non Af Amer 55 (L) >60 mL/min   GFR calc Af Amer >60 >60 mL/min    Comment: (NOTE) The eGFR has been calculated using the CKD EPI equation. This calculation has not been validated in all clinical situations. eGFR's persistently <60 mL/min signify possible Chronic Kidney Disease.    Anion gap 10 5 - 15  Magnesium     Status: None   Collection Time: 02/10/17  3:17 AM  Result Value Ref Range   Magnesium 1.9 1.7 - 2.4 mg/dL  Troponin I (q 6hr x 3)     Status: Abnormal   Collection Time: 02/10/17  1:54 PM  Result Value Ref Range   Troponin I 0.03 (HH) <0.03 ng/mL    Comment: CRITICAL RESULT CALLED TO, READ BACK BY AND VERIFIED WITH: L MUELLER,RN [id number] 02/10/2017 WB[facility]   Troponin I (q 6hr x 3)     Status: Abnormal   Collection Time: 02/10/17  7:19 PM  Result Value Ref Range   Troponin I 0.03 (HH) <0.03 ng/mL    Comment: CRITICAL VALUE NOTED.  VALUE IS CONSISTENT WITH PREVIOUSLY REPORTED AND CALLED VALUE.  Troponin I (q 6hr x 3)     Status: Abnormal   Collection Time: 02/11/17  1:46 AM  Result Value Ref Range   Troponin I 0.03 (HH) <0.03 ng/mL    Comment: CRITICAL VALUE NOTED.  VALUE IS CONSISTENT WITH PREVIOUSLY REPORTED AND CALLED VALUE.  Protime-INR     Status: Abnormal   Collection Time: 02/11/17  1:46 AM  Result Value Ref Range   Prothrombin Time 26.2 (H) 11.4 - 15.2 seconds   INR 9.56   Basic metabolic panel     Status: Abnormal   Collection Time: 02/11/17  1:46 AM  Result Value Ref Range   Sodium 133 (L) 135 - 145 mmol/L   Potassium 4.1 3.5 - 5.1 mmol/L   Chloride 94 (L) 101 - 111 mmol/L  CO2 32 22 - 32 mmol/L   Glucose, Bld 120 (H) 65 - 99 mg/dL   BUN 17 6 - 20 mg/dL   Creatinine, Ser 1.20  (H) 0.44 - 1.00 mg/dL   Calcium 8.9 8.9 - 10.3 mg/dL   GFR calc non Af Amer 44 (L) >60 mL/min   GFR calc Af Amer 51 (L) >60 mL/min    Comment: (NOTE) The eGFR has been calculated using the CKD EPI equation. This calculation has not been validated in all clinical situations. eGFR's persistently <60 mL/min signify possible Chronic Kidney Disease.    Anion gap 7 5 - 15  Magnesium     Status: None   Collection Time: 02/11/17  1:46 AM  Result Value Ref Range   Magnesium 1.8 1.7 - 2.4 mg/dL  CBC with Differential/Platelet     Status: Abnormal   Collection Time: 02/11/17  1:46 AM  Result Value Ref Range   WBC 4.7 4.0 - 10.5 K/uL   RBC 3.67 (L) 3.87 - 5.11 MIL/uL   Hemoglobin 9.9 (L) 12.0 - 15.0 g/dL   HCT 32.4 (L) 36.0 - 46.0 %   MCV 88.3 78.0 - 100.0 fL   MCH 27.0 26.0 - 34.0 pg   MCHC 30.6 30.0 - 36.0 g/dL   RDW 14.6 11.5 - 15.5 %   Platelets 226 150 - 400 K/uL   Neutrophils Relative % 51 %   Neutro Abs 2.4 1.7 - 7.7 K/uL   Lymphocytes Relative 33 %   Lymphs Abs 1.6 0.7 - 4.0 K/uL   Monocytes Relative 13 %   Monocytes Absolute 0.6 0.1 - 1.0 K/uL   Eosinophils Relative 3 %   Eosinophils Absolute 0.1 0.0 - 0.7 K/uL   Basophils Relative 0 %   Basophils Absolute 0.0 0.0 - 0.1 K/uL  Protime-INR     Status: Abnormal   Collection Time: 02/12/17  3:31 AM  Result Value Ref Range   Prothrombin Time 24.2 (H) 11.4 - 15.2 seconds   INR 7.09   Basic metabolic panel     Status: Abnormal   Collection Time: 02/12/17  3:31 AM  Result Value Ref Range   Sodium 135 135 - 145 mmol/L   Potassium 4.0 3.5 - 5.1 mmol/L   Chloride 93 (L) 101 - 111 mmol/L   CO2 34 (H) 22 - 32 mmol/L   Glucose, Bld 102 (H) 65 - 99 mg/dL   BUN 17 6 - 20 mg/dL   Creatinine, Ser 0.99 0.44 - 1.00 mg/dL   Calcium 8.9 8.9 - 10.3 mg/dL   GFR calc non Af Amer 56 (L) >60 mL/min   GFR calc Af Amer >60 >60 mL/min    Comment: (NOTE) The eGFR has been calculated using the CKD EPI equation. This calculation has not been  validated in all clinical situations. eGFR's persistently <60 mL/min signify possible Chronic Kidney Disease.    Anion gap 8 5 - 15  Brain natriuretic peptide     Status: Abnormal   Collection Time: 02/12/17  3:31 AM  Result Value Ref Range   B Natriuretic Peptide 523.2 (H) 0.0 - 100.0 pg/mL  CBC     Status: Abnormal   Collection Time: 02/12/17  3:31 AM  Result Value Ref Range   WBC 4.3 4.0 - 10.5 K/uL   RBC 3.63 (L) 3.87 - 5.11 MIL/uL   Hemoglobin 9.8 (L) 12.0 - 15.0 g/dL   HCT 31.9 (L) 36.0 - 46.0 %   MCV 87.9 78.0 - 100.0 fL  MCH 27.0 26.0 - 34.0 pg   MCHC 30.7 30.0 - 36.0 g/dL   RDW 14.7 11.5 - 15.5 %   Platelets 224 150 - 400 K/uL  POCT INR     Status: None   Collection Time: 02/24/17 11:43 AM  Result Value Ref Range   INR 1.4   POCT INR     Status: None   Collection Time: 02/24/17 11:47 AM  Result Value Ref Range   INR 1.4   Basic metabolic panel     Status: Abnormal   Collection Time: 02/24/17 12:35 PM  Result Value Ref Range   Glucose 92 65 - 99 mg/dL   BUN 17 8 - 27 mg/dL   Creatinine, Ser 1.03 (H) 0.57 - 1.00 mg/dL   GFR calc non Af Amer 55 (L) >59 mL/min/1.73   GFR calc Af Amer 63 >59 mL/min/1.73   BUN/Creatinine Ratio 17 12 - 28   Sodium 140 134 - 144 mmol/L   Potassium 4.4 3.5 - 5.2 mmol/L   Chloride 92 (L) 96 - 106 mmol/L   CO2 31 (H) 18 - 29 mmol/L   Calcium 9.9 8.7 - 10.3 mg/dL  CBC with Differential/Platelet     Status: Abnormal   Collection Time: 02/24/17 12:35 PM  Result Value Ref Range   WBC 5.6 3.4 - 10.8 x10E3/uL   RBC 4.22 3.77 - 5.28 x10E6/uL   Hemoglobin 11.3 11.1 - 15.9 g/dL   Hematocrit 36.1 34.0 - 46.6 %   MCV 86 79 - 97 fL   MCH 26.8 26.6 - 33.0 pg   MCHC 31.3 (L) 31.5 - 35.7 g/dL   RDW 15.4 12.3 - 15.4 %   Platelets 272 150 - 379 x10E3/uL   Neutrophils 57 Not Estab. %   Lymphs 30 Not Estab. %   Monocytes 12 Not Estab. %   Eos 1 Not Estab. %   Basos 0 Not Estab. %   Neutrophils Absolute 3.2 1.4 - 7.0 x10E3/uL   Lymphocytes  Absolute 1.7 0.7 - 3.1 x10E3/uL   Monocytes Absolute 0.7 0.1 - 0.9 x10E3/uL   EOS (ABSOLUTE) 0.1 0.0 - 0.4 x10E3/uL   Basophils Absolute 0.0 0.0 - 0.2 x10E3/uL   Immature Granulocytes 0 Not Estab. %   Immature Grans (Abs) 0.0 0.0 - 0.1 x10E3/uL  Protime-INR     Status: None   Collection Time: 03/04/17 10:13 AM  Result Value Ref Range   Prothrombin Time 13.9 11.4 - 15.2 seconds   INR 1.06   I-STAT 3, venous blood gas (G3P V)     Status: Abnormal   Collection Time: 03/04/17 12:53 PM  Result Value Ref Range   pH, Ven 7.440 (H) 7.250 - 7.430   pCO2, Ven 49.9 44.0 - 60.0 mmHg   pO2, Ven 29.0 (LL) 32.0 - 45.0 mmHg   Bicarbonate 33.9 (H) 20.0 - 28.0 mmol/L   TCO2 35 0 - 100 mmol/L   O2 Saturation 56.0 %   Acid-Base Excess 8.0 (H) 0.0 - 2.0 mmol/L   Patient temperature HIDE    Sample type VENOUS    Comment NOTIFIED PHYSICIAN   I-STAT 3, arterial blood gas (G3+)     Status: Abnormal   Collection Time: 03/04/17 12:58 PM  Result Value Ref Range   pH, Arterial 7.495 (H) 7.350 - 7.450   pCO2 arterial 42.9 32.0 - 48.0 mmHg   pO2, Arterial 58.0 (L) 83.0 - 108.0 mmHg   Bicarbonate 33.0 (H) 20.0 - 28.0 mmol/L   TCO2 34 0 -  100 mmol/L   O2 Saturation 92.0 %   Acid-Base Excess 9.0 (H) 0.0 - 2.0 mmol/L   Patient temperature HIDE    Sample type ARTERIAL   POCT INR     Status: None   Collection Time: 03/10/17  9:41 AM  Result Value Ref Range   INR 1.5   POCT INR     Status: None   Collection Time: 03/19/17  7:37 AM  Result Value Ref Range   INR 2.3   POCT INR     Status: None   Collection Time: 04/05/17 11:17 AM  Result Value Ref Range   INR 2.0   Basic metabolic panel     Status: Abnormal   Collection Time: 04/08/17  8:38 AM  Result Value Ref Range   Glucose 138 (H) 65 - 99 mg/dL   BUN 11 8 - 27 mg/dL   Creatinine, Ser 0.71 0.57 - 1.00 mg/dL   GFR calc non Af Amer 86 >59 mL/min/1.73   GFR calc Af Amer 99 >59 mL/min/1.73   BUN/Creatinine Ratio 15 12 - 28   Sodium 138 134 - 144  mmol/L   Potassium 4.4 3.5 - 5.2 mmol/L   Chloride 94 (L) 96 - 106 mmol/L   CO2 30 (H) 18 - 29 mmol/L    Comment: **Effective April 12, 2017 Carbon Dioxide, Total**   reference interval will be changing to:              Age                  Female          Female      0 days   - 30 days         47 - 41        16 - 58     31 days   -  1 year         15 - 25        15 - 25      2 years  -  5 years        68 - 24        17 - 48      6 years  - 12 years        62 - 29        19 - 46                >12 years        65 - 56        20 - 29    Calcium 9.8 8.7 - 10.3 mg/dL   Lab Results  Component Value Date   HGBA1C 6.0 02/06/2016   MPG 120 (H) 05/04/2013   No results found for: PROLACTIN Lab Results  Component Value Date   CHOL 207 (H) 12/09/2016   TRIG 221 (H) 12/09/2016   HDL 35 (L) 12/09/2016   CHOLHDL 5.9 (H) 12/09/2016   VLDL 25 07/02/2016   LDLCALC 128 (H) 12/09/2016   LDLCALC 155 (H) 07/02/2016     Current Medications: Current Outpatient Prescriptions  Medication Sig Dispense Refill  . carvedilol (COREG) 3.125 MG tablet TAKE 1 TABLET BY MOUTH TWICE A DAY WITH A MEAL 180 tablet 3  . clindamycin (CLEOCIN) 300 MG capsule Take 300-600 mg by mouth See admin instructions. TAKE 2 CAPSULES RIGHT AWAY THEN 1 EVERY 6 HOURS AFTER DENTAL PROCEDURE    . desvenlafaxine (PRISTIQ) 50  MG 24 hr tablet Take 1 tablet (50 mg total) by mouth daily. 30 tablet 2  . diazepam (VALIUM) 2 MG tablet Take 2 mg by mouth 2 (two) times daily as needed for anxiety.     . furosemide (LASIX) 40 MG tablet Take 1 tablet (40 mg total) by mouth daily. 30 tablet 11  . gabapentin (NEURONTIN) 100 MG capsule Take 100-200 mg by mouth 3 (three) times daily. 100 mg in am, 100 mg at noon and  200 mg at bedtime    . HYDROcodone-acetaminophen (NORCO/VICODIN) 5-325 MG tablet Take 1 tablet by mouth 2 (two) times daily.     Marland Kitchen ipratropium (ATROVENT) 0.02 % nebulizer solution USE 1 VIAL BY NEBULIZATION 4 (FOUR) TIMES DAILY.  (Patient taking differently: USE 1 VIAL BY NEBULIZATION 4 (FOUR) TIMES DAILY AS NEEDED FOR WHEEZING) 75 mL 3  . losartan (COZAAR) 25 MG tablet Take 0.5 tablets (12.5 mg total) by mouth daily. 30 tablet 0  . montelukast (SINGULAIR) 10 MG tablet Take 1 tablet (10 mg total) by mouth at bedtime. (Patient taking differently: Take 10 mg by mouth daily. ) 90 tablet 0  . ondansetron (ZOFRAN ODT) 8 MG disintegrating tablet Take 1 tablet (8 mg total) by mouth every 8 (eight) hours as needed for nausea or vomiting. 20 tablet 0  . oxybutynin (DITROPAN-XL) 10 MG 24 hr tablet Take 10 mg by mouth daily.     . pantoprazole (PROTONIX) 40 MG tablet Take 1 tablet by mouth every morning.     . polyethylene glycol (MIRALAX / GLYCOLAX) packet Take by mouth daily as needed for mild constipation. TAKES TEASPOONFUL IN 8 OZ OF WATER    . rosuvastatin (CRESTOR) 5 MG tablet Take 5 mg by mouth once a week. Sunday  3  . sodium chloride (OCEAN) 0.65 % SOLN nasal spray Place 1 spray into the nose 3 (three) times daily as needed for congestion. Only applies in the clogged nostril    . spironolactone (ALDACTONE) 25 MG tablet Take 0.5 tablets (12.5 mg total) by mouth daily. 30 tablet 11  . tiZANidine (ZANAFLEX) 2 MG tablet Take 1-2 mg by mouth 3 (three) times daily as needed for muscle spasms. Takes a 1/2-1 tablet depending on pain level    . traZODone (DESYREL) 100 MG tablet Take 1 tablet (100 mg total) by mouth at bedtime. 90 tablet 0  . triamcinolone ointment (KENALOG) 0.1 % Apply 1 application topically 3 (three) times daily as needed (EAR IRRITATION).     Marland Kitchen warfarin (COUMADIN) 5 MG tablet TAKE 1/2 TO 1 TABLET BY MOUTH EVERY DAY OR AS DIRECTED BY COUMADIN CLINIC (FILLABLE 09/02/16) 30 tablet 1   No current facility-administered medications for this visit.     Neurologic: Headache: No Seizure: No Paresthesias: No  Musculoskeletal: Strength & Muscle Tone: decreased Gait & Station: normal Patient leans: N/A  Psychiatric  Specialty Exam: Review of Systems  Constitutional:       Tired and exhausted  HENT: Negative.   Respiratory: Positive for shortness of breath.   Gastrointestinal: Negative.   Musculoskeletal: Positive for back pain and joint pain.  Skin: Negative.  Negative for itching and rash.  Neurological: Positive for weakness. Negative for tingling.  Psychiatric/Behavioral: Positive for depression. The patient is nervous/anxious.     Blood pressure 108/64, pulse 94, height _0  (1.549 m), weight 186 lb (84.4 kg).Body mass index is 35.14 kg/m.  General Appearance: Casual  Eye Contact:  Fair  Speech:  Slow  Volume:  Normal  Mood:  Depressed and Dysphoric  Affect:  Constricted and Depressed  Thought Process:  Goal Directed  Orientation:  Full (Time, Place, and Person)  Thought Content: Rumination   Suicidal Thoughts:  No  Homicidal Thoughts:  No  Memory:  Immediate;   Fair Recent;   Fair Remote;   Fair  Judgement:  Good  Insight:  Good  Psychomotor Activity:  Decreased  Concentration:  Concentration: Fair and Attention Span: Fair  Recall:  [employer] of Knowledge: Good  Language: Good  Akathisia:  No  Handed:  Right  AIMS (if indicated):  0  Assets:  Communication Skills Desire for Improvement Housing  ADL's:  Intact  Cognition: Impaired,  Mild  Sleep:  Fair    Assessment: Major depressive disorder, recurrent.  Posttraumatic stress disorder.  Generalized anxiety disorder.  Plan: I review her collateral information from other provider including recent discharge summary and blood work results.  She is experiencing increased depression and anxiety.  She also endorses increased pain and has not seen pain specialist recently.  She is taking Neurontin, hydrocodone and muscle relaxant.  Discussed polypharmacy.  She has not taken Valium for a while and she still has refill remaining.  I recommended to increase Pristiq 100 mg to help the depression.  Continue trazodone 100 mg daily.  One  more time I offered counseling but patient declined.  Recommended to call us back if she has any question or any concern or if she feels worsening of the symptom.  Discuss safety concern that anytime having active suicidal thoughts or homicidal thoughts and she need to call 911 or go to the local emergency room.  Follow-up in 3 months. Time spent 25 minutes.  More than 50% of the time spent in psychoeducation, counseling and coordination of care.  Discuss safety plan that anytime having active suicidal thoughts or homicidal thoughts then patient need to call 911 or go to the local emergency room.      Chika Cichowski T., MD 04/15/2017, 10:15 AM

## 2017-04-15 NOTE — Telephone Encounter (Signed)
Medication management - Fax received from patient's [facility] for a 90 day supply of Pristiq as was only given a 30 day order today with no refills. Patient returns 07/16/17.

## 2017-04-16 ENCOUNTER — Other Ambulatory Visit (HOSPITAL_COMMUNITY): Payer: Self-pay

## 2017-04-16 DIAGNOSIS — F0630 Mood disorder due to known physiological condition, unspecified: Secondary | ICD-10-CM

## 2017-04-16 MED ORDER — DESVENLAFAXINE SUCCINATE ER 100 MG PO TB24: 100.0000 mg | ORAL_TABLET | Freq: Every day | ORAL | 0 refills | Status: DC

## 2017-04-21 NOTE — Progress Notes (Signed)
Cardiology Office Note    Date:  04/22/2017   ID:  [name], [name] 1946-05-16, MRN [id number]  PCP:  [name], MD  Cardiologist: Dr. [place]   Chief Complaint  Patient presents with  . Follow-up    2 weeks  . Edema    Right leg.  . Congestive Heart Failure    History of Present Illness:    Laurie Horton is a 71 y.o. female with past medical history of chronic diastolic CHF, PFO, PAF (on Coumadin), COPD (on 2L [place] at baseline), HTN, and prior CVA who is seen for follow up CHF.  She was admitted from 4/9 - 02/12/2017 for worsening dyspnea on exertion and palpitations. Was in atrial fibrillation with RVR upon arrival to the ED. Echo during admission showed a newly reduced EF of 20-25% and she was diuresed with IV Lasix. Was followed by Cardiology and it was thought her cardiomyopathy was possibly of a Takatsubo etiology as she denied any recent anginal symptoms with enzymes being negative and EKG showing no acute ischemic changes. It was recommended to consider a right/left heart cath in 4-6 weeks. Weight was 193 lbs at time of discharge and she was continued on Lasix 40mg  BID along with Coreg 3.125mg  BID and Losartan 12.5 mg daily.  She did undergo right and left heart cath on 03/04/17. This showed severe 2 vessel obstructive CAD with 100% RCA occlusion, 75% OM1, and 90% small OM2. EF 25-30%. Mild pulmonary HTN with normal LV filling pressures. 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 Maximizing CHF therapy recommended. At time of cath it appeared she had more of a multifocal atrial arrhythmia. On her last visit we started aldactone and reduced her lasix dose with the idea of increasing medication as BP allows.   On follow up today she is doing very well. Breathing is stable. She is wearing oxygen.  Notes focal  Edema in right leg. Weight is unchanged. Reports BP normal at home with wrist cuff. Will not let us check BP due to fibromyalgia.  She reports good compliance with  her medication regimen and has been keeping a very detailed log of her sodium and fluid intake.    Past Medical History:  Diagnosis Date  . Complication of anesthesia    various issues with oxygen  saturations post op  . COPD (chronic obstructive pulmonary disease) (HCC)   . Depression   . Diverticulitis   . DVT (deep venous thrombosis) (HCC)   . Fibromyalgia   . GERD (gastroesophageal reflux disease)   . Hiatal hernia   . History of deviated nasal septum    left- side  . HTN (hypertension)   . Hyperlipidemia   . Obesity   . On supplemental oxygen therapy    concentrator at night @ 1.5 l/m or when sleeps. O2 Sat niormally 87.  . Osteoporosis   . PAT (paroxysmal atrial tachycardia) (HCC)   . PFO (patent foramen ovale)   . PULMONARY NODULE, LEFT LOWER LOBE 10/14/2009   5mm LLL nodule dec 2010. Stable and 4mm in Oct 2012. No further fu  . Right middle lobe pneumonia (HCC) 07/24/2011   First noted at admit 07/10/11. Persists on cxr 07/22/11. Cleared on CT 08/24/11. No further followup  . Stroke (HCC)   . TOBACCO ABUSE 06/04/2009    Past Surgical History:  Procedure Laterality Date  . CARDIOVASCULAR STRESS TEST  12/26/2004   EF 74%. NO EVIDENCE OF ISCHEMIA  . ESOPHAGOGASTRODUODENOSCOPY (EGD) WITH PROPOFOL  N/A 04/15/2015   Procedure: ESOPHAGOGASTRODUODENOSCOPY (EGD) WITH PROPOFOL;  Surgeon: Carman Ching, MD;  Location: [facility];  Service: Endoscopy;  Laterality: N/A;  . KNEE ARTHROSCOPY  2000   left  . LAPAROSCOPIC CHOLECYSTECTOMY  04-16-2010   [place]  . MOUTH SURGERY     03-26-15 multiple extractions stitches remains  . RIGHT/LEFT HEART CATH AND CORONARY ANGIOGRAPHY N/A 03/04/2017   Procedure: Right/Left Heart Cath and Coronary Angiography;  Surgeon: Hazelgrace Bonham M [place], MD;  Location: [facility] INVASIVE CV LAB;  Service: Cardiovascular;  Laterality: N/A;  . TOTAL ABDOMINAL HYSTERECTOMY     post op needed oxygen was told "she gave them a scare"  . TUBAL LIGATION    . US ECHOCARDIOGRAPHY   11/20/2009   EF 55-60%    Current Medications: Outpatient Medications Prior to Visit  Medication Sig Dispense Refill  . clindamycin (CLEOCIN) 300 MG capsule Take 300-600 mg by mouth See admin instructions. TAKE 2 CAPSULES RIGHT AWAY THEN 1 EVERY 6 HOURS AFTER DENTAL PROCEDURE    . desvenlafaxine (PRISTIQ) 100 MG 24 hr tablet Take 1 tablet (100 mg total) by mouth daily. 90 tablet 0  . diazepam (VALIUM) 2 MG tablet Take 2 mg by mouth 2 (two) times daily as needed for anxiety.     . furosemide (LASIX) 40 MG tablet Take 1 tablet (40 mg total) by mouth daily. 30 tablet 11  . gabapentin (NEURONTIN) 100 MG capsule Take 100-200 mg by mouth 3 (three) times daily. 100 mg in am, 100 mg at noon and  200 mg at bedtime    . HYDROcodone-acetaminophen (NORCO/VICODIN) 5-325 MG tablet Take 1 tablet by mouth 2 (two) times daily.     Marland Kitchen ipratropium (ATROVENT) 0.02 % nebulizer solution USE 1 VIAL BY NEBULIZATION 4 (FOUR) TIMES DAILY. (Patient taking differently: USE 1 VIAL BY NEBULIZATION 4 (FOUR) TIMES DAILY AS NEEDED FOR WHEEZING) 75 mL 3  . losartan (COZAAR) 25 MG tablet Take 0.5 tablets (12.5 mg total) by mouth daily. 30 tablet 0  . montelukast (SINGULAIR) 10 MG tablet Take 1 tablet (10 mg total) by mouth at bedtime. (Patient taking differently: Take 10 mg by mouth daily. ) 90 tablet 0  . ondansetron (ZOFRAN ODT) 8 MG disintegrating tablet Take 1 tablet (8 mg total) by mouth every 8 (eight) hours as needed for nausea or vomiting. 20 tablet 0  . oxybutynin (DITROPAN-XL) 10 MG 24 hr tablet Take 10 mg by mouth daily.     . pantoprazole (PROTONIX) 40 MG tablet Take 1 tablet by mouth every morning.     . polyethylene glycol (MIRALAX / GLYCOLAX) packet Take by mouth daily as needed for mild constipation. TAKES TEASPOONFUL IN 8 OZ OF WATER    . rosuvastatin (CRESTOR) 5 MG tablet Take 5 mg by mouth once a week. Sunday  3  . sodium chloride (OCEAN) 0.65 % SOLN nasal spray Place 1 spray into the nose 3 (three) times daily  as needed for congestion. Only applies in the clogged nostril    . tiZANidine (ZANAFLEX) 2 MG tablet Take 1-2 mg by mouth 3 (three) times daily as needed for muscle spasms. Takes a 1/2-1 tablet depending on pain level    . traZODone (DESYREL) 100 MG tablet Take 1 tablet (100 mg total) by mouth at bedtime. 90 tablet 0  . triamcinolone ointment (KENALOG) 0.1 % Apply 1 application topically 3 (three) times daily as needed (EAR IRRITATION).     Marland Kitchen warfarin (COUMADIN) 5 MG tablet TAKE 1/2 TO 1 TABLET  BY MOUTH EVERY DAY OR AS DIRECTED BY COUMADIN CLINIC (FILLABLE 09/02/16) 30 tablet 1  . carvedilol (COREG) 3.125 MG tablet TAKE 1 TABLET BY MOUTH TWICE A DAY WITH A MEAL 180 tablet 3  . spironolactone (ALDACTONE) 25 MG tablet Take 0.5 tablets (12.5 mg total) by mouth daily. 30 tablet 11   No facility-administered medications prior to visit.      Allergies:   Alprazolam; Bee venom; Iodine; Pseudoephedrine hcl er; Budesonide-formoterol fumarate; Crestor [rosuvastatin calcium]; Esomeprazole magnesium; Flonase [fluticasone propionate]; Loratadine; Lotrimin [clotrimazole]; Lunesta [eszopiclone]; Oxcarbazepine; Statins; Zolpidem tartrate; Clarithromycin; Aciphex [rabeprazole sodium]; Avelox [moxifloxacin hcl in nacl]; Bextra [valdecoxib]; Ceclor [cefaclor]; Cephalexin; Covera-hs [verapamil hcl]; Dicyclomine hcl; Other; and Tessalon perles   Social History   Social History  . Marital status: Divorced    Spouse name: N/A  . Number of children: 2  . Years of education: N/A   Occupational History  . disability    Social History Main Topics  . Smoking status: Former Smoker    Quit date: 06/02/2010  . Smokeless tobacco: Never Used  . Alcohol use No  . Drug use: No  . Sexual activity: Yes    Partners: Male    Birth control/ protection: None   Other Topics Concern  . None   Social History Narrative  . None     Family History:  The patient's family history includes Alzheimer's disease in her mother;  Dementia in her mother; Diabetes in her mother and sister; Heart attack in her father; Heart attack (age of onset: 64) in her brother; Schizophrenia in her sister; Tremor in her sister.   Review of Systems:   As noted in HPI.  All other systems reviewed and are otherwise negative except as noted above.   Physical Exam:    VS:  Pulse 82   Ht 5\' 4"  (1.626 m)   Wt 185 lb (83.9 kg)   BMI 31.76 kg/m    General: Well developed, well nourished Caucasian female appearing in no acute distress.  Head: Normocephalic, atraumatic, sclera non-icteric, no xanthomas, nares are without discharge.  Neck: No carotid bruits. JVD not elevated.  Lungs: Clear Heart: Regular rate and rhythm. No S3 or S4.  No murmur, no rubs, or gallops appreciated. Abdomen: Soft, non-tender, non-distended with normoactive bowel sounds. No hepatomegaly. No rebound/guarding. No obvious abdominal masses. Msk:  Strength and tone appear normal for age. No joint deformities or effusions. Extremities: No clubbing or cyanosis. Focal area of swelling right anterior shin. Otherwise no edema.  Distal pedal pulses are 2+ bilaterally. Neuro: Alert and oriented X 3. Moves all extremities spontaneously. No focal deficits noted. Psych:  Responds to questions appropriately with a normal affect. Skin: No rashes or lesions noted  Wt Readings from Last 3 Encounters:  04/22/17 185 lb (83.9 kg)  04/05/17 185 lb 9.6 oz (84.2 kg)  03/04/17 180 lb 3 oz (81.7 kg)     Studies/Labs Reviewed:   EKG:  EKG is not ordered today.   Recent Labs: 12/09/2016: ALT 17 02/11/2017: Magnesium 1.8 02/12/2017: B Natriuretic Peptide 523.2 02/24/2017: Hemoglobin 11.3; Platelets 272 04/08/2017: BUN 11; Creatinine, Ser 0.71; Potassium 4.4; Sodium 138   Lipid Panel    Component Value Date/Time   CHOL 207 (H) 12/09/2016 1328   TRIG 221 (H) 12/09/2016 1328   HDL 35 (L) 12/09/2016 1328   CHOLHDL 5.9 (H) 12/09/2016 1328   CHOLHDL 4.6 07/02/2016 0823   VLDL 25  07/02/2016 0823   LDLCALC 128 (H) 12/09/2016 1328  Additional studies/ records that were reviewed today include:   Echocardiogram: 02/09/2017 Study Conclusions  - Left ventricle: The cavity size was mildly dilated. Wall   thickness was increased in a pattern of mild LVH. Systolic   function was severely reduced. The estimated ejection fraction   was in the range of 20% to 25%. Diffuse hypokinesis. Doppler   parameters are consistent with abnormal left ventricular   relaxation (grade 1 diastolic dysfunction). - Aortic valve: Valve area (Vmax): 1.4 cm^2. - Aortic root: The aortic root was mildly dilated. - Ascending aorta: The ascending aorta was mildly dilated. - Mitral valve: Calcified annulus. There was moderate   regurgitation. - Left atrium: The atrium was moderately dilated. - Right ventricle: Systolic function was moderately reduced. - Pulmonary arteries: Systolic pressure was mildly increased. - Pericardium, extracardiac: A trivial pericardial effusion was   identified.  Impressions:  - No subcostal views; severe global reduction in LV systolic   function; grade 1 diastolic dysfunction; mildly dilated aortic   root and ascending aorta; moderate MR; moderaet LAE; moderately   reduced RV function; mild TR; mildly elevated pulmonary pressure.  Procedures   Right/Left Heart Cath and Coronary Angiography  Conclusion     Ost 1st Mrg to 1st Mrg lesion, 75 %stenosed.  2nd Mrg lesion, 90 %stenosed.  Ost RCA to Mid RCA lesion, 100 %stenosed.  There is severe left ventricular systolic dysfunction.  LV end diastolic pressure is normal.  The left ventricular ejection fraction is 25-35% by visual estimate.  Hemodynamic findings consistent with mild pulmonary hypertension.  LV end diastolic pressure is normal.   1. Severe 2 vessel obstructive CAD    - 75% proximal OM1    - 90% small OM2    - 100% proximal RCA. Left to right collaterals.  2. Severe LV  dysfunction 3. Mild pulmonary HTN with normal LV filling pressures.  4. Cardiac index 2.41 L/min/BSA   Plan: Medical management to try and optimize CHF therapy. Patient appears to be adequately diuresed at this time. Based on these results her cardiomyopathy is ischemic. I would treat her CAD medically. If cardiac cath is needed in the future would consider alternative access given difficulty from the right radial approach. Her rhythm during procedure is a multifocal atrial rhythm/tachycardia.       Assessment:    1. Chronic combined systolic and diastolic heart failure (HCC)   2. PAF (paroxysmal atrial fibrillation) (HCC)   3. Coronary artery disease involving native coronary artery of native heart without angina pectoris   4. Ischemic cardiomyopathy   5. Chronic respiratory failure with hypoxia (HCC)      Plan:   In order of problems listed above:  1. Chronic Combined Systolic and Diastolic CHF/ New Cardiomyopathy - EF 25-30% with ischemic cardiomyopathy. Not a candidate for revascularization.  - she appears to be euvolemic at this time. - continue BB, ARB, and statin. Titrate meds as tolerated for maximal effect. Will increase Coreg to 6.25 mg bid. Increase aldactone to 25 mg daily.  Check BMET today. Follow up in 4 weeks and continue to titrate meds slowly as BP allows. Once on optimal therapy will repeat Echo after 2 months. Will need to consider whether she is a candidate for ICD.  2. Paroxysmal Atrial Fibrillation/ multifocal atrial tachycardia - This patients CHA2DS2-VASc Score and unadjusted Ischemic Stroke Rate (% per year) is equal to 9.7 % stroke rate/year from a score of 6 (CHF, HTN, Female, Age, CVA (2)). She denies any evidence  of active bleeding. Continue Coumadin for anticoagulation. INR today 1.9 and pharmacy adjusted.  - increase Coreg 6.25mg  BID for rate-control.  3. HTN  4. COPD- severe - followed by Pulmonology.   5. PFO - no plans for closure at this  time.     Signed, Christoffer Currier [place], MD  04/22/2017 5:14 PM    [facility] [address] [place], [address] [place], [place]  [address] Phone: [phone]; Fax: [phone]  [address], [address] [place], [place] [address] Phone: [phone]

## 2017-04-22 ENCOUNTER — Encounter: Payer: Self-pay | Admitting: Cardiology

## 2017-04-22 ENCOUNTER — Ambulatory Visit (INDEPENDENT_AMBULATORY_CARE_PROVIDER_SITE_OTHER): Payer: Medicare Other | Admitting: Cardiology

## 2017-04-22 ENCOUNTER — Ambulatory Visit (INDEPENDENT_AMBULATORY_CARE_PROVIDER_SITE_OTHER): Payer: Medicare Other | Admitting: Pharmacist Clinician (PhC)/ Clinical Pharmacy Specialist

## 2017-04-22 VITALS — HR 82 | Ht 64.0 in | Wt 185.0 lb

## 2017-04-22 DIAGNOSIS — Z7901 Long term (current) use of anticoagulants: Secondary | ICD-10-CM

## 2017-04-22 DIAGNOSIS — I480 Paroxysmal atrial fibrillation: Secondary | ICD-10-CM | POA: Diagnosis not present

## 2017-04-22 DIAGNOSIS — I255 Ischemic cardiomyopathy: Secondary | ICD-10-CM

## 2017-04-22 DIAGNOSIS — I2510 Atherosclerotic heart disease of native coronary artery without angina pectoris: Secondary | ICD-10-CM

## 2017-04-22 DIAGNOSIS — J9611 Chronic respiratory failure with hypoxia: Secondary | ICD-10-CM | POA: Diagnosis not present

## 2017-04-22 DIAGNOSIS — I5042 Chronic combined systolic (congestive) and diastolic (congestive) heart failure: Secondary | ICD-10-CM

## 2017-04-22 LAB — POCT INR: INR: 1.9

## 2017-04-22 MED ORDER — CARVEDILOL 6.25 MG PO TABS: 6.2500 mg | ORAL_TABLET | Freq: Two times a day (BID) | ORAL | 3 refills | Status: DC

## 2017-04-22 MED ORDER — SPIRONOLACTONE 25 MG PO TABS: 25.0000 mg | ORAL_TABLET | Freq: Every day | ORAL | 11 refills | Status: DC

## 2017-04-22 NOTE — Patient Instructions (Signed)
We will increase Coreg to 6.25 mg twice a day  Increase aldactone to 25 mg daily  We will check blood work today  I will see you in one month.

## 2017-04-23 LAB — BASIC METABOLIC PANEL
BUN/Creatinine Ratio: 14 (ref 12–28)
BUN: 13 mg/dL (ref 8–27)
CALCIUM: 10 mg/dL (ref 8.7–10.3)
CHLORIDE: 93 mmol/L — AB (ref 96–106)
CO2: 27 mmol/L (ref 20–29)
Creatinine, Ser: 0.9 mg/dL (ref 0.57–1.00)
GFR, EST AFRICAN AMERICAN: 74 mL/min/{1.73_m2} (ref >59)
GFR, EST NON AFRICAN AMERICAN: 65 mL/min/{1.73_m2} (ref >59)
Glucose: 107 mg/dL — ABNORMAL HIGH (ref 65–99)
Potassium: 4.7 mmol/L (ref 3.5–5.2)
Sodium: 138 mmol/L (ref 134–144)

## 2017-04-28 ENCOUNTER — Encounter: Payer: Self-pay | Admitting: Family Medicine

## 2017-04-28 ENCOUNTER — Ambulatory Visit (INDEPENDENT_AMBULATORY_CARE_PROVIDER_SITE_OTHER): Payer: Medicare Other | Admitting: Family Medicine

## 2017-04-28 VITALS — BP 131/80 | HR 90 | Temp 97.0°F | Resp 18 | Ht 63.78 in | Wt 183.0 lb

## 2017-04-28 DIAGNOSIS — I10 Essential (primary) hypertension: Secondary | ICD-10-CM | POA: Diagnosis not present

## 2017-04-28 DIAGNOSIS — Z6831 Body mass index (BMI) 31.0-31.9, adult: Secondary | ICD-10-CM

## 2017-04-28 DIAGNOSIS — M47816 Spondylosis without myelopathy or radiculopathy, lumbar region: Secondary | ICD-10-CM | POA: Diagnosis not present

## 2017-04-28 DIAGNOSIS — E6609 Other obesity due to excess calories: Secondary | ICD-10-CM | POA: Diagnosis not present

## 2017-04-28 DIAGNOSIS — Z0000 Encounter for general adult medical examination without abnormal findings: Secondary | ICD-10-CM

## 2017-04-28 DIAGNOSIS — J449 Chronic obstructive pulmonary disease, unspecified: Secondary | ICD-10-CM

## 2017-04-28 DIAGNOSIS — I5031 Acute diastolic (congestive) heart failure: Secondary | ICD-10-CM

## 2017-04-28 DIAGNOSIS — M797 Fibromyalgia: Secondary | ICD-10-CM | POA: Diagnosis not present

## 2017-04-28 DIAGNOSIS — I5022 Chronic systolic (congestive) heart failure: Secondary | ICD-10-CM | POA: Diagnosis not present

## 2017-04-28 DIAGNOSIS — E7800 Pure hypercholesterolemia, unspecified: Secondary | ICD-10-CM

## 2017-04-28 DIAGNOSIS — I480 Paroxysmal atrial fibrillation: Secondary | ICD-10-CM

## 2017-04-28 DIAGNOSIS — K219 Gastro-esophageal reflux disease without esophagitis: Secondary | ICD-10-CM

## 2017-04-28 DIAGNOSIS — F411 Generalized anxiety disorder: Secondary | ICD-10-CM

## 2017-04-28 DIAGNOSIS — Z7901 Long term (current) use of anticoagulants: Secondary | ICD-10-CM

## 2017-04-28 DIAGNOSIS — F331 Major depressive disorder, recurrent, moderate: Secondary | ICD-10-CM

## 2017-04-28 DIAGNOSIS — E2839 Other primary ovarian failure: Secondary | ICD-10-CM

## 2017-04-28 NOTE — Patient Instructions (Addendum)
   IF you received an x-ray today, you will receive an invoice from [facility]. Please contact [facility] at [phone] with questions or concerns regarding your invoice.   IF you received labwork today, you will receive an invoice from [facility]. Please contact [facility] at [phone] with questions or concerns regarding your invoice.   Our billing staff will not be able to assist you with questions regarding bills from these companies.  You will be contacted with the lab results as soon as they are available. The fastest way to get your results is to activate your My Chart account. Instructions are located on the last page of this paperwork. If you have not heard from us regarding the results in 2 weeks, please contact this office.      Preventive Care 65 Years and Older, Female Preventive care refers to lifestyle choices and visits with your health care provider that can promote health and wellness. What does preventive care include?  A yearly physical exam. This is also called an annual well check.  Dental exams once or twice a year.  Routine eye exams. Ask your health care provider how often you should have your eyes checked.  Personal lifestyle choices, including: ? Daily care of your teeth and gums. ? Regular physical activity. ? Eating a healthy diet. ? Avoiding tobacco and drug use. ? Limiting alcohol use. ? Practicing safe sex. ? Taking low-dose aspirin every day. ? Taking vitamin and mineral supplements as recommended by your health care provider. What happens during an annual well check? The services and screenings done by your health care provider during your annual well check will depend on your age, overall health, lifestyle risk factors, and family history of disease. Counseling Your health care provider may ask you questions about your:  Alcohol use.  Tobacco use.  Drug use.  Emotional well-being.  Home and relationship  well-being.  Sexual activity.  Eating habits.  History of falls.  Memory and ability to understand (cognition).  Work and work environment.  Reproductive health.  Screening You may have the following tests or measurements:  Height, weight, and BMI.  Blood pressure.  Lipid and cholesterol levels. These may be checked every 5 years, or more frequently if you are over 50 years old.  Skin check.  Lung cancer screening. You may have this screening every year starting at age 55 if you have a 30-pack-year history of smoking and currently smoke or have quit within the past 15 years.  Fecal occult blood test (FOBT) of the stool. You may have this test every year starting at age 50.  Flexible sigmoidoscopy or colonoscopy. You may have a sigmoidoscopy every 5 years or a colonoscopy every 10 years starting at age 50.  Hepatitis C blood test.  Hepatitis B blood test.  Sexually transmitted disease (STD) testing.  Diabetes screening. This is done by checking your blood sugar (glucose) after you have not eaten for a while (fasting). You may have this done every 1-3 years.  Bone density scan. This is done to screen for osteoporosis. You may have this done starting at age 65.  Mammogram. This may be done every 1-2 years. Talk to your health care provider about how often you should have regular mammograms.  Talk with your health care provider about your test results, treatment options, and if necessary, the need for more tests. Vaccines Your health care provider may recommend certain vaccines, such as:  Influenza vaccine. This is recommended every year.    Tetanus, diphtheria, and acellular pertussis (Tdap, Td) vaccine. You may need a Td booster every 10 years.  Varicella vaccine. You may need this if you have not been vaccinated.  Zoster vaccine. You may need this after age 60.  Measles, mumps, and rubella (MMR) vaccine. You may need at least one dose of MMR if you were born in  1957 or later. You may also need a second dose.  Pneumococcal 13-valent conjugate (PCV13) vaccine. One dose is recommended after age 65.  Pneumococcal polysaccharide (PPSV23) vaccine. One dose is recommended after age 65.  Meningococcal vaccine. You may need this if you have certain conditions.  Hepatitis A vaccine. You may need this if you have certain conditions or if you travel or work in places where you may be exposed to hepatitis A.  Hepatitis B vaccine. You may need this if you have certain conditions or if you travel or work in places where you may be exposed to hepatitis B.  Haemophilus influenzae type b (Hib) vaccine. You may need this if you have certain conditions.  Talk to your health care provider about which screenings and vaccines you need and how often you need them. This information is not intended to replace advice given to you by your health care provider. Make sure you discuss any questions you have with your health care provider. Document Released: 11/15/2015 Document Revised: 07/08/2016 Document Reviewed: 08/20/2015 Elsevier Interactive Patient Education  2017 [employer]  

## 2017-04-28 NOTE — Progress Notes (Signed)
Subjective:    Patient ID: Laurie WILLAMSON, female    DOB: December 23, 1945, 71 y.o.   MRN: [id number]  04/28/2017  Annual Exam   HPI This 71 y.o. female presents for Annual Wellness Examination and Complete Physical Examination and follow-up of chronic medical conditions.  Last physical: never Pap smear:  n/d Mammogram:  09/29/2016 Colonoscopy:   [facility]; every 10 years ago; last colonoscopy five years ago. Specialist advised against colonoscopy due to poor health. Bone density:  07/04/2014 [facility] osteopenia.  Immunization History  Administered Date(s) Administered  . Influenza Split 08/03/2011, 08/01/2012  . Influenza,inj,Quad PF,36+ Mos 08/14/2013, 09/03/2014, 09/04/2015, 08/18/2016  . Pneumococcal Conjugate-13 10/01/2014  . Pneumococcal Polysaccharide-23 09/03/1999, 06/02/2006, 08/14/2013  . Td 11/02/1994  . Tdap 08/18/2016   BP Readings from Last 3 Encounters:  04/28/17 131/80  04/05/17 115/68  03/04/17 112/68   Wt Readings from Last 3 Encounters:  04/28/17 183 lb (83 kg)  04/22/17 185 lb (83.9 kg)  04/05/17 185 lb 9.6 oz (84.2 kg)    Hypercholesterolemia: intolerant to Crestor; cardiology suggested injection.  VHQ:IONGEXBM by [place]. Oxybutynin.  CHF: recent admission in April 2018 with Atrial fibrillation with RVR; EF 20-25% which was new onset.  Cardiomyopathy due to [facility].  Discharged on Lasix 40mg  bid and Coreg 3.125mg  bid and Losartan 12.5mg  daily. S/p cardiac catheterization 03/03/17; severe 2 vessel obstructive CAD and 100% RCA occlusion and 75% OM1, 90% smallOM2.  EF 25%.  Considering debrillator. "I'm dying".  COPD: severe; followed by pulmonology.  Bipolar disorder: followed by Nel[facility] Severe monthly; not doing well emotionally.  Previously worked at [employer].   Previously worked for [employer].  "I'm an educated intelligent woman"   Review of Systems  Constitutional: Positive for fatigue. Negative for activity change, appetite change, chills, diaphoresis, fever  and unexpected weight change.  HENT: Negative for congestion, dental problem, drooling, ear discharge, ear pain, facial swelling, hearing loss, mouth sores, nosebleeds, postnasal drip, rhinorrhea, sinus pressure, sneezing, sore throat, tinnitus, trouble swallowing and voice change.   Eyes: Negative for photophobia, pain, discharge, redness, itching and visual disturbance.  Respiratory: Positive for shortness of breath. Negative for apnea, cough, choking, chest tightness, wheezing and stridor.   Cardiovascular: Negative for chest pain, palpitations and leg swelling.  Gastrointestinal: Positive for constipation. Negative for abdominal distention, abdominal pain, anal bleeding, blood in stool, diarrhea, nausea, rectal pain and vomiting.  Endocrine: Negative for cold intolerance, heat intolerance, polydipsia, polyphagia and polyuria.  Genitourinary: Negative for decreased urine volume, difficulty urinating, dyspareunia, dysuria, enuresis, flank pain, frequency, genital sores, hematuria, menstrual problem, pelvic pain, urgency, vaginal bleeding, vaginal discharge and vaginal pain.       NOCTURIA 2.  URINARY LEAKAGE; WEARS DEPENDS.   Musculoskeletal: Negative for arthralgias, back pain, gait problem, joint swelling, myalgias, neck pain and neck stiffness.  Skin: Negative for color change, pallor, rash and wound.  Allergic/Immunologic: Negative for environmental allergies, food allergies and immunocompromised state.  Neurological: Negative for dizziness, tremors, seizures, syncope, facial asymmetry, speech difficulty, weakness, light-headedness, numbness and headaches.  Hematological: Negative for adenopathy. Does not bruise/bleed easily.  Psychiatric/Behavioral: Positive for dysphoric mood. Negative for agitation, behavioral problems, confusion, decreased concentration, hallucinations, self-injury, sleep disturbance and suicidal ideas. The patient is nervous/anxious. The patient is not hyperactive.      Past Medical History:  Diagnosis Date  . CHF (congestive heart failure) (HCC) 03/02/2017   EF 25-30% 2018  . Complication of anesthesia    various issues with oxygen  saturations post op  . COPD (  chronic obstructive pulmonary disease) (HCC)   . Depression   . Diverticulitis   . DVT (deep venous thrombosis) (HCC)   . Fibromyalgia   . GERD (gastroesophageal reflux disease)   . Hiatal hernia   . History of deviated nasal septum    left- side  . HTN (hypertension)   . Hyperlipidemia   . Obesity   . On supplemental oxygen therapy    concentrator at night @ 1.5 l/m or when sleeps. O2 Sat niormally 87.  . Osteoporosis   . PAT (paroxysmal atrial tachycardia) (HCC)   . PFO (patent foramen ovale)   . PULMONARY NODULE, LEFT LOWER LOBE 10/14/2009   5mm LLL nodule dec 2010. Stable and 4mm in Oct 2012. No further fu  . Right middle lobe pneumonia (HCC) 07/24/2011   First noted at admit 07/10/11. Persists on cxr 07/22/11. Cleared on CT 08/24/11. No further followup  . Stroke (HCC)   . TOBACCO ABUSE 06/04/2009   Past Surgical History:  Procedure Laterality Date  . CARDIOVASCULAR STRESS TEST  12/26/2004   EF 74%. NO EVIDENCE OF ISCHEMIA  . ESOPHAGOGASTRODUODENOSCOPY (EGD) WITH PROPOFOL N/A 04/15/2015   Procedure: ESOPHAGOGASTRODUODENOSCOPY (EGD) WITH PROPOFOL;  Surgeon: Carman Ching, MD;  Location: [facility];  Service: Endoscopy;  Laterality: N/A;  . KNEE ARTHROSCOPY  2000   left  . LAPAROSCOPIC CHOLECYSTECTOMY  04-16-2010   [facility]  . MOUTH SURGERY     03-26-15 multiple extractions stitches remains  . RIGHT/LEFT HEART CATH AND CORONARY ANGIOGRAPHY N/A 03/04/2017   Procedure: Right/Left Heart Cath and Coronary Angiography;  Surgeon: Peter M Swaziland, MD;  Location: [facility] INVASIVE CV LAB;  Service: Cardiovascular;  Laterality: N/A;  . TOTAL ABDOMINAL HYSTERECTOMY     post op needed oxygen was told "she gave them a scare"  . TUBAL LIGATION    . US ECHOCARDIOGRAPHY  11/20/2009   EF 55-60%    Allergies  Allergen Reactions  . Alprazolam Other (See Comments)    REACTION: stops breathing  . Bee Venom Anaphylaxis  . Iodine Anaphylaxis and Swelling    REACTION: swelling in throat  . Pseudoephedrine Hcl Er Shortness Of Breath  . Budesonide-Formoterol Fumarate Other (See Comments)    Blisters inside of mouth all over  . Crestor [Rosuvastatin Calcium] Other (See Comments)    Unable to walk  . Esomeprazole Magnesium Other (See Comments)    REACTION: "bouncing off walls"  . Flonase [Fluticasone Propionate] Other (See Comments)    NOSE BLEED  . Loratadine Other (See Comments)    claritin D causes shaking  . Lotrimin [Clotrimazole] Other (See Comments)    blisters  . Lunesta [Eszopiclone] Other (See Comments)    REACTION: "slept for a week"  . Oxcarbazepine Other (See Comments)    Causes deep sleep and dizziness  . Statins Other (See Comments)    Can't walk, legs won't work   . Zolpidem Tartrate Other (See Comments)    REACTION: "slept for a week"  . Clarithromycin Other (See Comments)    All "mycins" Puts into "a" fib Will take if has to for severe sinus infection  . Aciphex [Rabeprazole Sodium] Rash  . Avelox [Moxifloxacin Hcl In Nacl] Other (See Comments)    Stomach cramps.   Marlowe Aschoff [Valdecoxib] Rash  . Ceclor [Cefaclor] Rash  . Cephalexin Rash    Pt states that she is possibly allergic to this - had a reaction to Cefaclor in the past and she does not want to these class drugs. Added per patient  request.  . Covera-Hs [Verapamil Hcl] Palpitations  . Dicyclomine Hcl Rash  . Other Other (See Comments)    Glue from ekg/heart monitor leads --rash +  Any MYCINS  . Tessalon Perles Rash    Social History   Social History  . Marital status: Divorced    Spouse name: N/A  . Number of children: 2  . Years of education: N/A   Occupational History  . disability    Social History Main Topics  . Smoking status: Former Smoker    Quit date: 06/02/2010  . Smokeless  tobacco: Never Used  . Alcohol use No  . Drug use: No  . Sexual activity: Yes    Partners: Male    Birth control/ protection: None   Other Topics Concern  . Not on file   Social History Narrative   Marital status: divorced in 1978 after ten years; dating casually      Children: 3 biological children; 3 court appointed children; 6 grandchildren; 5 gg      Lives: alone; children in [place] and [place].      Employment: retired age 23; disability for COPD, CVA at age 15.      Tobacco: former smoker; quit smoking 2011.      Alcohol: none      Exercise: walks dog four times per day; goes to pool three times per week.       ADLs: drives; no assistant devices; does have a walker.  Cleaning is limited in 2018.  Daughter helps with cleaning.  Does own grocery shopping.      Advanced Directives: YES; DNR/DNI; HCPOA:          Family History  Problem Relation Age of Onset  . Dementia Mother   . Diabetes Mother   . Alzheimer's disease Mother   . Heart attack Brother 47  . Heart attack Father   . Schizophrenia Sister   . Diabetes Sister   . Tremor Sister        Objective:    BP 131/80   Pulse 90   Temp 97 F (36.1 C) (Oral)   Resp 18   Ht 5' 3.78" (1.62 m)   Wt 183 lb (83 kg)   SpO2 93%   BMI 31.63 kg/m  Physical Exam  Constitutional: She is oriented to person, place, and time. She appears well-developed and well-nourished. No distress.  HENT:  Head: Normocephalic and atraumatic.  Right Ear: External ear normal.  Left Ear: External ear normal.  Nose: Nose normal.  Mouth/Throat: Oropharynx is clear and moist.  Eyes: Conjunctivae and EOM are normal. Pupils are equal, round, and reactive to light.  Neck: Normal range of motion and full passive range of motion without pain. Neck supple. No JVD present. Carotid bruit is not present. No thyromegaly present.  Cardiovascular: Normal rate, regular rhythm and normal heart sounds.  Exam reveals no gallop and no friction rub.   No  murmur heard. Pulmonary/Chest: Effort normal and breath sounds normal. She has no wheezes. She has no rales.  Abdominal: Soft. Bowel sounds are normal. She exhibits no distension and no mass. There is no tenderness. There is no rebound and no guarding.  Musculoskeletal:       Right shoulder: Normal.       Left shoulder: Normal.       Cervical back: Normal.  Lymphadenopathy:    She has no cervical adenopathy.  Neurological: She is alert and oriented to person, place, and time. She has normal  reflexes. No cranial nerve deficit. She exhibits normal muscle tone. Coordination normal.  Skin: Skin is warm and dry. No rash noted. She is not diaphoretic. No erythema. No pallor.  Psychiatric: She has a normal mood and affect. Her behavior is normal. Judgment and thought content normal.  Nursing note and vitals reviewed.  Results for orders placed or performed in visit on 04/22/17  POCT INR  Result Value Ref Range   INR 1.9    Depression screen [facility] 2/9 04/28/2017 02/08/2017 12/01/2016 09/12/2016 09/01/2016  Decreased Interest 0 0 0 0 0  Down, Depressed, Hopeless 0 0 0 0 0  PHQ - 2 Score 0 0 0 0 0  Altered sleeping - - - - -  Tired, decreased energy - - - - -  Change in appetite - - - - -  Feeling bad or failure about yourself  - - - - -  Trouble concentrating - - - - -  Moving slowly or fidgety/restless - - - - -  Suicidal thoughts - - - - -  PHQ-9 Score - - - - -  Difficult doing work/chores - - - - -  Some recent data might be hidden   Fall Risk  04/28/2017 02/08/2017 12/01/2016 09/12/2016 09/01/2016  Falls in the past year? No No No Yes No  Number falls in past yr: - - - 1 -  Injury with Fall? - - - No -  Follow up - - - - -   Functional Status Survey: Is the patient deaf or have difficulty hearing?: No Does the patient have difficulty seeing, even when wearing glasses/contacts?: No Does the patient have difficulty concentrating, remembering, or making decisions?: No Does the patient have  difficulty walking or climbing stairs?: No Does the patient have difficulty dressing or bathing?: No Does the patient have difficulty doing errands alone such as visiting a doctor's office or shopping?: No     Assessment & Plan:   1. Encounter for Medicare annual wellness exam   2. Routine physical examination   3. Estrogen deficiency   4. PAF (paroxysmal atrial fibrillation) (HCC)   5. Essential hypertension   6. Acute diastolic heart failure, NYHA class 1 (HCC)   7. Chronic systolic CHF (congestive heart failure) (HCC)   8. COPD, severe (HCC)   9. Gastroesophageal reflux disease without esophagitis   10. Spondylosis of lumbar region without myelopathy or radiculopathy   11. Long term current use of anticoagulant therapy   12. Pure hypercholesterolemia   13. Fibromyalgia and chronic chest pain   14. Generalized anxiety disorder   15. Moderate episode of recurrent major depressive disorder (HCC)   16. Class 1 obesity due to excess calories with serious comorbidity and body mass index (BMI) of 31.0 to 31.9 in adult    -anticipatory guidance provided --- exercise, weight loss, safe driving practices, aspirin 81mg  daily. -obtain age appropriate screening labs and labs for chronic disease management. -moderate fall risk; undergoing treatment for depression; no evidence of hearing loss.  Discussed advanced directives and living will; also discussed end of life issues including code status.  -pt refused Pneumovax. -colonoscopy UTD: will need to obtain copy of most recent colonoscopy from [facility] -recent hospital admission records reviewed in detail during visit.  Considering defibrillator for CHF with EF 25%.  -followed closely by cardiology.  Recent increase in Coreg and Aldactone. -followed by pain management for chronic DDD lumbar spine. -followed closely by psychiatry and mental illness is significant comorbidity affecting overall well-being.  Orders Placed This Encounter    Procedures  . DG Bone Density    Standing Status:   Future    Standing Expiration Date:   06/28/2018    Scheduling Instructions:     [facility]    Order Specific Question:   Reason for Exam (SYMPTOM  OR DIAGNOSIS REQUIRED)    Answer:   estrogen deficiency    Order Specific Question:   Preferred imaging location?    Answer:   External   No orders of the defined types were placed in this encounter.   Return in about 6 months (around 10/28/2017) for recheck .   Camesha Farooq Paulita Fujita, M.D. Primary Care at [facility] previously [facility] [address] [place], [place]  [address] [phone] phone [phone] fax

## 2017-04-28 NOTE — Progress Notes (Signed)
   Subjective:    Patient ID: Laurie Horton, female    DOB: Mar 31, 1946, 71 y.o.   MRN: [id number]  HPI    Review of Systems  HENT: Positive for ear pain.   Cardiovascular: Positive for palpitations.  Gastrointestinal: Positive for constipation and diarrhea.  Musculoskeletal: Positive for arthralgias, back pain, myalgias and neck stiffness.  Neurological: Positive for light-headedness and headaches.  Psychiatric/Behavioral: Positive for agitation and dysphoric mood.       Objective:   Physical Exam        Assessment & Plan:

## 2017-05-01 ENCOUNTER — Encounter: Payer: Self-pay | Admitting: Family Medicine

## 2017-05-01 DIAGNOSIS — E6609 Other obesity due to excess calories: Secondary | ICD-10-CM | POA: Insufficient documentation

## 2017-05-01 DIAGNOSIS — Z6831 Body mass index (BMI) 31.0-31.9, adult: Secondary | ICD-10-CM

## 2017-05-07 ENCOUNTER — Ambulatory Visit (INDEPENDENT_AMBULATORY_CARE_PROVIDER_SITE_OTHER): Payer: Medicare Other | Admitting: Pharmacist

## 2017-05-07 DIAGNOSIS — I480 Paroxysmal atrial fibrillation: Secondary | ICD-10-CM | POA: Diagnosis not present

## 2017-05-07 DIAGNOSIS — Z7901 Long term (current) use of anticoagulants: Secondary | ICD-10-CM

## 2017-05-07 LAB — POCT INR: INR: 2.2

## 2017-05-08 ENCOUNTER — Other Ambulatory Visit (HOSPITAL_COMMUNITY): Payer: Self-pay | Admitting: Psychiatry

## 2017-05-08 DIAGNOSIS — F0630 Mood disorder due to known physiological condition, unspecified: Secondary | ICD-10-CM

## 2017-05-14 ENCOUNTER — Other Ambulatory Visit (HOSPITAL_COMMUNITY): Payer: Self-pay | Admitting: Psychiatry

## 2017-05-14 ENCOUNTER — Other Ambulatory Visit (HOSPITAL_COMMUNITY): Payer: Self-pay

## 2017-05-14 DIAGNOSIS — F0630 Mood disorder due to known physiological condition, unspecified: Secondary | ICD-10-CM

## 2017-05-15 ENCOUNTER — Telehealth: Payer: Self-pay | Admitting: Physician Assistant

## 2017-05-15 MED ORDER — SPIRONOLACTONE 25 MG PO TABS: 25.0000 mg | ORAL_TABLET | Freq: Every day | ORAL | 11 refills | Status: DC

## 2017-05-15 NOTE — Telephone Encounter (Signed)
Called with 4 lb weight gain overnight. Asymptomatic. She is not taking spironolactone 25mg  qd as pharmacy said too early to refills. Will send new Rx. Advised to take extra 40mg  qd.

## 2017-05-21 NOTE — Progress Notes (Signed)
Cardiology Office Note    Date:  05/27/2017   ID:  Jaydeen, Tocco [birth year], MRN [id number]  PCP:  [name], MD  Cardiologist: Dr. Swaziland   Chief Complaint  Patient presents with  . Congestive Heart Failure    History of Present Illness:    Laurie Horton is a 71 y.o. female with past medical history of chronic diastolic CHF, PFO, PAF (on Coumadin), COPD (on 2L [place] at baseline), HTN, and prior CVA who is seen for follow up CHF.  She was admitted from 4/9 - 02/12/2017 for worsening dyspnea on exertion and palpitations. Was in atrial fibrillation with RVR upon arrival to the ED. Echo during admission showed a newly reduced EF of 20-25% and she was diuresed with IV Lasix. Was followed by Cardiology and it was thought her cardiomyopathy was possibly of a Takatsubo etiology as she denied any recent anginal symptoms with enzymes being negative and EKG showing no acute ischemic changes. It was recommended to consider a right/left heart cath in 4-6 weeks. Weight was 193 lbs at time of discharge and she was continued on Lasix 40mg  BID along with Coreg 3.125mg  BID and Losartan 12.5 mg daily.  She did undergo right and left heart cath on 03/04/17. This showed severe 2 vessel obstructive CAD with 100% RCA occlusion, 75% OM1, and 90% small OM2. EF 25-30%. Mild pulmonary HTN with normal LV filling pressures. 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 Maximizing CHF therapy recommended. At time of cath it appeared she had more of a multifocal atrial arrhythmia. On her last visit we started aldactone and reduced her lasix dose with the idea of increasing medication as BP allows. Coreg dose was increased.   On follow up today she is doing very well. Breathing is stable. She is wearing oxygen with exertion.  Notes focal  Edema in right calf.  Weight is unchanged. On July 14 reported a 4 lbs weight gain in one day that responded to an extra lasix. Reports BP normal at home with wrist cuff.  She  reports good compliance with her medication regimen. She is tolerating medication well. She is planning to have an injection for a trigger point in her left hip.   Past Medical History:  Diagnosis Date  . CHF (congestive heart failure) (HCC) 03/02/2017   EF 25-30% 2018  . Complication of anesthesia    various issues with oxygen  saturations post op  . COPD (chronic obstructive pulmonary disease) (HCC)   . Depression   . Diverticulitis   . DVT (deep venous thrombosis) (HCC)   . Fibromyalgia   . GERD (gastroesophageal reflux disease)   . Hiatal hernia   . History of deviated nasal septum    left- side  . HTN (hypertension)   . Hyperlipidemia   . Obesity   . On supplemental oxygen therapy    concentrator at night @ 1.5 l/m or when sleeps. O2 Sat niormally 87.  . Osteoporosis   . PAT (paroxysmal atrial tachycardia) (HCC)   . PFO (patent foramen ovale)   . PULMONARY NODULE, LEFT LOWER LOBE 10/14/2009   5mm LLL nodule dec 2010. Stable and 4mm in Oct 2012. No further fu  . Right middle lobe pneumonia (HCC) 07/24/2011   First noted at admit 07/10/11. Persists on cxr 07/22/11. Cleared on CT 08/24/11. No further followup  . Stroke (HCC)   . TOBACCO ABUSE 06/04/2009    Past Surgical History:  Procedure Laterality Date  . CARDIOVASCULAR  STRESS TEST  12/26/2004   EF 74%. NO EVIDENCE OF ISCHEMIA  . ESOPHAGOGASTRODUODENOSCOPY (EGD) WITH PROPOFOL N/A 04/15/2015   Procedure: ESOPHAGOGASTRODUODENOSCOPY (EGD) WITH PROPOFOL;  Surgeon: Carman Ching, MD;  Location: [facility];  Service: Endoscopy;  Laterality: N/A;  . KNEE ARTHROSCOPY  2000   left  . LAPAROSCOPIC CHOLECYSTECTOMY  04-16-2010   [place]  . MOUTH SURGERY     03-26-15 multiple extractions stitches remains  . RIGHT/LEFT HEART CATH AND CORONARY ANGIOGRAPHY N/A 03/04/2017   Procedure: Right/Left Heart Cath and Coronary Angiography;  Surgeon: Von Inscoe M Swaziland, MD;  Location: [facility];  Service: Cardiovascular;  Laterality: N/A;  .  TOTAL ABDOMINAL HYSTERECTOMY     post op needed oxygen was told "she gave them a scare"  . TUBAL LIGATION    . US ECHOCARDIOGRAPHY  11/20/2009   EF 55-60%    Current Medications: Outpatient Medications Prior to Visit  Medication Sig Dispense Refill  . desvenlafaxine (PRISTIQ) 50 MG 24 hr tablet TAKE 1 TABLET (50 MG TOTAL) BY MOUTH DAILY. 90 tablet 0  . diazepam (VALIUM) 2 MG tablet Take 2 mg by mouth 2 (two) times daily as needed for anxiety.     . furosemide (LASIX) 40 MG tablet Take 1 tablet (40 mg total) by mouth daily. 30 tablet 11  . gabapentin (NEURONTIN) 100 MG capsule Take 100-200 mg by mouth 3 (three) times daily. 100 mg in am, 100 mg at noon and  200 mg at bedtime    . HYDROcodone-acetaminophen (NORCO/VICODIN) 5-325 MG tablet Take 1 tablet by mouth 3 (three) times daily.     Marland Kitchen ipratropium (ATROVENT) 0.02 % nebulizer solution USE 1 VIAL BY NEBULIZATION 4 (FOUR) TIMES DAILY. (Patient taking differently: USE 1 VIAL BY NEBULIZATION 2 (FOUR) TIMES DAILY AS NEEDED FOR WHEEZING) 75 mL 3  . losartan (COZAAR) 25 MG tablet Take 0.5 tablets (12.5 mg total) by mouth daily. 30 tablet 0  . montelukast (SINGULAIR) 10 MG tablet Take 1 tablet (10 mg total) by mouth at bedtime. (Patient taking differently: Take 10 mg by mouth daily. ) 90 tablet 0  . ondansetron (ZOFRAN ODT) 8 MG disintegrating tablet Take 1 tablet (8 mg total) by mouth every 8 (eight) hours as needed for nausea or vomiting. 20 tablet 0  . oxybutynin (DITROPAN-XL) 10 MG 24 hr tablet Take 10 mg by mouth daily.     . pantoprazole (PROTONIX) 40 MG tablet Take 1 tablet by mouth every morning.     . polyethylene glycol (MIRALAX / GLYCOLAX) packet Take by mouth daily as needed for mild constipation. TAKES TEASPOONFUL IN 8 OZ OF WATER    . rosuvastatin (CRESTOR) 5 MG tablet Take 5 mg by mouth once a week. Sunday  3  . sodium chloride (OCEAN) 0.65 % SOLN nasal spray Place 1 spray into the nose 3 (three) times daily as needed for congestion.  Only applies in the clogged nostril    . spironolactone (ALDACTONE) 25 MG tablet Take 1 tablet (25 mg total) by mouth daily. 30 tablet 11  . tiZANidine (ZANAFLEX) 2 MG tablet Take 1-2 mg by mouth 3 (three) times daily as needed for muscle spasms. Takes a 1/2-1 tablet depending on pain level    . traZODone (DESYREL) 100 MG tablet Take 1 tablet (100 mg total) by mouth at bedtime. 90 tablet 0  . triamcinolone ointment (KENALOG) 0.1 % Apply 1 application topically 3 (three) times daily as needed (EAR IRRITATION).     . carvedilol (COREG) 6.25  MG tablet Take 1 tablet (6.25 mg total) by mouth 2 (two) times daily. (Patient taking differently: Take 12.5 mg by mouth 2 (two) times daily. ) 180 tablet 3  . warfarin (COUMADIN) 5 MG tablet TAKE 1/2 TO 1 TABLET BY MOUTH EVERY DAY OR AS DIRECTED BY COUMADIN CLINIC (FILLABLE 09/02/16) 30 tablet 1   No facility-administered medications prior to visit.      Allergies:   Alprazolam; Bee venom; Iodine; Pseudoephedrine hcl er; Budesonide-formoterol fumarate; Crestor [rosuvastatin calcium]; Esomeprazole magnesium; Flonase [fluticasone propionate]; Loratadine; Lotrimin [clotrimazole]; Lunesta [eszopiclone]; Oxcarbazepine; Statins; Zolpidem tartrate; Clarithromycin; Aciphex [rabeprazole sodium]; Avelox [moxifloxacin hcl in nacl]; Bextra [valdecoxib]; Ceclor [cefaclor]; Cephalexin; Covera-hs [verapamil hcl]; Dicyclomine hcl; Other; and Tessalon perles   Social History   Social History  . Marital status: Divorced    Spouse name: N/A  . Number of children: 2  . Years of education: N/A   Occupational History  . disability    Social History Main Topics  . Smoking status: Former Smoker    Quit date: 06/02/2010  . Smokeless tobacco: Never Used  . Alcohol use No  . Drug use: No  . Sexual activity: Yes    Partners: Male    Birth control/ protection: None   Other Topics Concern  . None   Social History Narrative   Marital status: divorced in 1978 after ten years;  dating casually      Children: 3 biological children; 3 court appointed children; 6 grandchildren; 5 gg      Lives: alone; children in [place] and [place].      Employment: retired age 58; disability for COPD, CVA at age 56.      Tobacco: former smoker; quit smoking 2011.      Alcohol: none      Exercise: walks dog four times per day; goes to pool three times per week.       ADLs: drives; no assistant devices; does have a walker.  Cleaning is limited in 2018.  Daughter helps with cleaning.  Does own grocery shopping.      Advanced Directives: YES; DNR/DNI; HCPOA:            Family History:  The patient's family history includes Alzheimer's disease in her mother; Dementia in her mother; Diabetes in her mother and sister; Heart attack in her father; Heart attack (age of onset: 2) in her brother; Schizophrenia in her sister; Tremor in her sister.   Review of Systems:   As noted in HPI.  All other systems reviewed and are otherwise negative except as noted above.   Physical Exam:    VS:  BP 116/61   Pulse 76   Ht 5\' 3"  (1.6 m)   Wt 184 lb (83.5 kg)   BMI 32.59 kg/m    General: Well developed, well nourished Caucasian female appearing in no acute distress.  Head: Normocephalic, atraumatic, sclera non-icteric, no xanthomas, nares are without discharge.  Neck: No carotid bruits. JVD not elevated.  Lungs: Clear Heart: Regular rate and rhythm. No S3 or S4.  No murmur, no rubs, or gallops appreciated. Abdomen: Soft, non-tender, non-distended with normoactive bowel sounds. No hepatomegaly. No rebound/guarding. No obvious abdominal masses. Msk:  Strength and tone appear normal for age. No joint deformities or effusions. Extremities: No clubbing or cyanosis. No edema.  Distal pedal pulses are 2+ bilaterally. Neuro: Alert and oriented X 3. Moves all extremities spontaneously. No focal deficits noted. Psych:  Responds to questions appropriately with a normal affect. Skin: No  rashes or  lesions noted  Wt Readings from Last 3 Encounters:  05/27/17 184 lb (83.5 kg)  05/25/17 183 lb 9.6 oz (83.3 kg)  04/28/17 183 lb (83 kg)     Studies/Labs Reviewed:   EKG:  EKG is not ordered today.   Recent Labs: 12/09/2016: ALT 17 02/11/2017: Magnesium 1.8 02/12/2017: B Natriuretic Peptide 523.2 02/24/2017: Hemoglobin 11.3; Platelets 272 04/22/2017: BUN 13; Creatinine, Ser 0.90; Potassium 4.7; Sodium 138   Lipid Panel    Component Value Date/Time   CHOL 207 (H) 12/09/2016 1328   TRIG 221 (H) 12/09/2016 1328   HDL 35 (L) 12/09/2016 1328   CHOLHDL 5.9 (H) 12/09/2016 1328   CHOLHDL 4.6 07/02/2016 0823   VLDL 25 07/02/2016 0823   LDLCALC 128 (H) 12/09/2016 1328    Additional studies/ records that were reviewed today include:   Echocardiogram: 02/09/2017 Study Conclusions  - Left ventricle: The cavity size was mildly dilated. Wall   thickness was increased in a pattern of mild LVH. Systolic   function was severely reduced. The estimated ejection fraction   was in the range of 20% to 25%. Diffuse hypokinesis. Doppler   parameters are consistent with abnormal left ventricular   relaxation (grade 1 diastolic dysfunction). - Aortic valve: Valve area (Vmax): 1.4 cm^2. - Aortic root: The aortic root was mildly dilated. - Ascending aorta: The ascending aorta was mildly dilated. - Mitral valve: Calcified annulus. There was moderate   regurgitation. - Left atrium: The atrium was moderately dilated. - Right ventricle: Systolic function was moderately reduced. - Pulmonary arteries: Systolic pressure was mildly increased. - Pericardium, extracardiac: A trivial pericardial effusion was   identified.  Impressions:  - No subcostal views; severe global reduction in LV systolic   function; grade 1 diastolic dysfunction; mildly dilated aortic   root and ascending aorta; moderate MR; moderaet LAE; moderately   reduced RV function; mild TR; mildly elevated pulmonary  pressure.  Procedures   Right/Left Heart Cath and Coronary Angiography  Conclusion     Ost 1st Mrg to 1st Mrg lesion, 75 %stenosed.  2nd Mrg lesion, 90 %stenosed.  Ost RCA to Mid RCA lesion, 100 %stenosed.  There is severe left ventricular systolic dysfunction.  LV end diastolic pressure is normal.  The left ventricular ejection fraction is 25-35% by visual estimate.  Hemodynamic findings consistent with mild pulmonary hypertension.  LV end diastolic pressure is normal.   1. Severe 2 vessel obstructive CAD    - 75% proximal OM1    - 90% small OM2    - 100% proximal RCA. Left to right collaterals.  2. Severe LV dysfunction 3. Mild pulmonary HTN with normal LV filling pressures.  4. Cardiac index 2.41 L/min/BSA   Plan: Medical management to try and optimize CHF therapy. Patient appears to be adequately diuresed at this time. Based on these results her cardiomyopathy is ischemic. I would treat her CAD medically. If cardiac cath is needed in the future would consider alternative access given difficulty from the right radial approach. Her rhythm during procedure is a multifocal atrial rhythm/tachycardia.       Assessment:    1. Chronic combined systolic and diastolic heart failure (HCC)   2. Coronary artery disease involving native coronary artery of native heart without angina pectoris   3. Ischemic cardiomyopathy   4. PAF (paroxysmal atrial fibrillation) (HCC)   5. Essential hypertension      Plan:   In order of problems listed above:  1. Chronic Combined Systolic and  Diastolic CHF/ New Cardiomyopathy - EF 25-30% with ischemic cardiomyopathy. Not a candidate for revascularization.  - she appears to be euvolemic at this time. - continue BB, ARB, and statin. Titrate meds as tolerated for maximal effect. Will increase Coreg to 12.5  mg bid. Continue aldactone at 25 mg daily. Increase losartan to 25 mg daily. Will ask Pharm D to see if we can get financial  assistance to get her started on Entresto.  Follow up in 4 weeks and continue to titrate meds slowly as BP allows. Once on optimal therapy will repeat Echo.  Will need to consider whether she is a candidate for ICD.  2. Paroxysmal Atrial Fibrillation/ multifocal atrial tachycardia - This patients CHA2DS2-VASc Score and unadjusted Ischemic Stroke Rate (% per year) is equal to 9.7 % stroke rate/year from a score of 6 (CHF, HTN, Female, Age, CVA (2)). She denies any evidence of active bleeding. Continue Coumadin for anticoagulation. INR today 3.4 and pharmacy adjusted.   3. HTN- controlled.   4. COPD- severe - followed by Pulmonology.   5. PFO - no plans for closure     Signed, Garrit Marrow Swaziland, MD  05/27/2017 2:39 PM    [facility] [address] [place], [address] [place], [place]  [address] Phone: [phone]; Fax: [phone]  [address], [address] [place], [place] [address] Phone: [phone]

## 2017-05-25 ENCOUNTER — Encounter: Payer: Self-pay | Admitting: Internal Medicine

## 2017-05-25 ENCOUNTER — Ambulatory Visit (INDEPENDENT_AMBULATORY_CARE_PROVIDER_SITE_OTHER): Payer: Medicare Other | Admitting: Internal Medicine

## 2017-05-25 VITALS — BP 124/85 | HR 85 | Ht 63.0 in | Wt 183.6 lb

## 2017-05-25 DIAGNOSIS — J9611 Chronic respiratory failure with hypoxia: Secondary | ICD-10-CM | POA: Diagnosis not present

## 2017-05-25 DIAGNOSIS — J438 Other emphysema: Secondary | ICD-10-CM

## 2017-05-25 NOTE — Progress Notes (Signed)
Subjective:     Patient ID: Laurie Horton, female   DOB: 01/23/1946, 71 y.o.   MRN: [id number]  HPI  [place], MD   OV 05/19/2016  Chief Complaint  Patient presents with  . Follow-up    Pt c/o worsening SOB, prod cough with thick white mucus.      This is a routine follow-up. Last visit was in April 2017 with nurse practitioner. At that time treated for COPD exacerbation according to her history but review of the chart does not show that to be true. At this point in time she says COPD stable although she says she might be in flare up on account of her fibromyalgia. Starting her symptoms out it appears that it is generally stable with dyspnea at baseline and cough with mild sputum at baseline. She is more hobbled by her chronic pain and fibromyalgia. Couple months ago apparently a hydrocodone was discontinued by rheumatology and therefore she is having "withdrawal". She does have a new pain medication physician. She is on gabapentin for fibromyalgia. There are no other new issues. She does not want antibiotics or prednisone for her perceived exacerbation  OV 11/17/2016  Chief Complaint  Patient presents with  . Follow-up    Pt states her SOB has worsened since last OV. Pt states she has a burning in her chest when she becomes SOB. Pt c/o prod cough with white mucus in morning, cough becomes nonprod throughout the day.     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 Last CT chest March 2017 without any mass and associated mild cor pulmonale   Six-month follow-up visit. She is completely overwhelmed by her fibromyalgia and depression. The fibromyalgia is worse. She says she is change doctors because of this. She's had a few admissions in the interim but none of them give a COPD exacerbation according to chart review. She suffered frustrated by her heels oxygen system even though it is light it is causing her  pain. She uses Atrovent nebulizer but wants change to something else but at the same time has rejected use of any other nebulizer or oral inhaler because of side effects. She is burning chest pain with inspiration and associated with her costochondral junction trigger points. 2014 review of the chart shows normal cardiac stress test. November 2017 chest x-ray is clear. She does not want any further imaging. Chest pain is mild to severe and variable. Worsened with inspiration. No radiation associated wheezing. No sputum production  OV 05/25/2017  Chief Complaint  Patient presents with  . Follow-up    Pt states her SOB has worsened since last OV in 11/2016. Pt states she only coughs after her neb treatment - pt states her mucus is yellow in color and c/o occ chest discomfort. Pt denies f/c/s.     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 Last CT chest March 2017 without any mass and associated mild cor pulmonale   Follow-up exertional hypoxemia associated with his emphysema. Also associated with fibromyalgia. Last visit she had atypical chest pain. Recommended she see cardiology. 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 According to her history she has significant coronary artery disease but is fairly advanced. She is frustrated with this but realistic. She feels her days are number. In terms of COPD emphysema and is  stable. She is on Atrovent inhalers. She is on oxygen. She wants a lighter system. She still burden by fibromyalgia. She does not want to do any vaccines anymore including flu shot and the new  shingles vaccine     has a past medical history of CHF (congestive heart failure) ([place]) (03/02/2017); Complication of anesthesia; COPD (chronic obstructive pulmonary disease) ([place]); Depression; Diverticulitis; DVT  (deep venous thrombosis) ([place]); Fibromyalgia; GERD (gastroesophageal reflux disease); Hiatal hernia; History of deviated nasal septum; HTN (hypertension); Hyperlipidemia; Obesity; On supplemental oxygen therapy; Osteoporosis; PAT (paroxysmal atrial tachycardia) (HCC); PFO (patent foramen ovale); PULMONARY NODULE, LEFT LOWER LOBE (10/14/2009); Right middle lobe pneumonia ([place]) (07/24/2011); Stroke [facility]); and TOBACCO ABUSE (06/04/2009).   reports that she quit smoking about 6 years ago. She has never used smokeless tobacco.  Past Surgical History:  Procedure Laterality Date  . CARDIOVASCULAR STRESS TEST  12/26/2004   EF 74%. NO EVIDENCE OF ISCHEMIA  . ESOPHAGOGASTRODUODENOSCOPY (EGD) WITH PROPOFOL N/A 04/15/2015   Procedure: ESOPHAGOGASTRODUODENOSCOPY (EGD) WITH PROPOFOL;  Surgeon: Laurence Spates, MD;  Location: [facility];  Service: Endoscopy;  Laterality: N/A;  . KNEE ARTHROSCOPY  2000   left  . LAPAROSCOPIC CHOLECYSTECTOMY  04-16-2010   [place]  . MOUTH SURGERY     03-26-15 multiple extractions stitches remains  . RIGHT/LEFT HEART CATH AND CORONARY ANGIOGRAPHY N/A 03/04/2017   Procedure: Right/Left Heart Cath and Coronary Angiography;  Surgeon: Peter M Martinique, MD;  Location: [facility];  Service: Cardiovascular;  Laterality: N/A;  . TOTAL ABDOMINAL HYSTERECTOMY     post op needed oxygen was told "she gave them a scare"  . TUBAL LIGATION    . US ECHOCARDIOGRAPHY  11/20/2009   EF 55-60%    Allergies  Allergen Reactions  . Alprazolam Other (See Comments)    REACTION: stops breathing  . Bee Venom Anaphylaxis  . Iodine Anaphylaxis and Swelling    REACTION: swelling in throat  . Pseudoephedrine Hcl Er Shortness Of Breath  . Budesonide-Formoterol Fumarate Other (See Comments)    Blisters inside of mouth all over  . Crestor [Rosuvastatin Calcium] Other (See Comments)    Unable to walk  . Esomeprazole Magnesium Other (See Comments)    REACTION: "bouncing off walls"  . Flonase  [Fluticasone Propionate] Other (See Comments)    NOSE BLEED  . Loratadine Other (See Comments)    claritin D causes shaking  . Lotrimin [Clotrimazole] Other (See Comments)    blisters  . Lunesta [Eszopiclone] Other (See Comments)    REACTION: "slept for a week"  . Oxcarbazepine Other (See Comments)    Causes deep sleep and dizziness  . Statins Other (See Comments)    Can't walk, legs won't work   . Zolpidem Tartrate Other (See Comments)    REACTION: "slept for a week"  . Clarithromycin Other (See Comments)    All "mycins" Puts into "a" fib Will take if has to for severe sinus infection  . Aciphex [Rabeprazole Sodium] Rash  . Avelox [Moxifloxacin Hcl In Nacl] Other (See Comments)    Stomach cramps.   Darlin Coco [Valdecoxib] Rash  . Ceclor [Cefaclor] Rash  . Cephalexin Rash    Pt states that she is possibly allergic to this - had a reaction to Cefaclor in the past and she does not want to these class drugs. Added per patient request.  . Covera-Hs [Verapamil Hcl] Palpitations  . Dicyclomine Hcl Rash  . Other Other (See Comments)    Glue from ekg/heart monitor leads --rash +  Any MYCINS  . Tessalon Perles Rash    Immunization History  Administered Date(s) Administered  . Influenza Split 08/03/2011, 08/01/2012  . Influenza,inj,Quad PF,36+ Mos 08/14/2013, 09/03/2014, 09/04/2015, 08/18/2016  . Pneumococcal Conjugate-13 10/01/2014  . Pneumococcal Polysaccharide-23 09/03/1999, 06/02/2006, 08/14/2013  . Td 11/02/1994  . Tdap 08/18/2016    Family History  Problem Relation Age of Onset  . Dementia Mother   . Diabetes Mother   . Alzheimer's disease Mother   . Heart attack Brother 38  . Heart attack Father   . Schizophrenia Sister   . Diabetes Sister   . Tremor Sister      Current Outpatient Prescriptions:  .  carvedilol (COREG) 6.25 MG tablet, Take 1 tablet (6.25 mg total) by mouth 2 (two) times daily. (Patient taking differently: Take 12.5 mg by mouth 2 (two) times daily.  ), Disp: 180 tablet, Rfl: 3 .  desvenlafaxine (PRISTIQ) 50 MG 24 hr tablet, TAKE 1 TABLET (50 MG TOTAL) BY MOUTH DAILY., Disp: 90 tablet, Rfl: 0 .  diazepam (VALIUM) 2 MG tablet, Take 2 mg by mouth 2 (two) times daily as needed for anxiety. , Disp: , Rfl:  .  furosemide (LASIX) 40 MG tablet, Take 1 tablet (40 mg total) by mouth daily., Disp: 30 tablet, Rfl: 11 .  gabapentin (NEURONTIN) 100 MG capsule, Take 100-200 mg by mouth 3 (three) times daily. 100 mg in am, 100 mg at noon and  200 mg at bedtime, Disp: , Rfl:  .  HYDROcodone-acetaminophen (NORCO/VICODIN) 5-325 MG tablet, Take 1 tablet by mouth 3 (three) times daily. , Disp: , Rfl:  .  ipratropium (ATROVENT) 0.02 % nebulizer solution, USE 1 VIAL BY NEBULIZATION 4 (FOUR) TIMES DAILY. (Patient taking differently: USE 1 VIAL BY NEBULIZATION 2 (FOUR) TIMES DAILY AS NEEDED FOR WHEEZING), Disp: 75 mL, Rfl: 3 .  losartan (COZAAR) 25 MG tablet, Take 0.5 tablets (12.5 mg total) by mouth daily., Disp: 30 tablet, Rfl: 0 .  montelukast (SINGULAIR) 10 MG tablet, Take 1 tablet (10 mg total) by mouth at bedtime. (Patient taking differently: Take 10 mg by mouth daily. ), Disp: 90 tablet, Rfl: 0 .  ondansetron (ZOFRAN ODT) 8 MG disintegrating tablet, Take 1 tablet (8 mg total) by mouth every 8 (eight) hours as needed for nausea or vomiting., Disp: 20 tablet, Rfl: 0 .  oxybutynin (DITROPAN-XL) 10 MG 24 hr tablet, Take 10 mg by mouth daily. , Disp: , Rfl:  .  pantoprazole (PROTONIX) 40 MG tablet, Take 1 tablet by mouth every morning. , Disp: , Rfl:  .  polyethylene glycol (MIRALAX / GLYCOLAX) packet, Take by mouth daily as needed for mild constipation. TAKES TEASPOONFUL IN 8 OZ OF WATER, Disp: , Rfl:  .  rosuvastatin (CRESTOR) 5 MG tablet, Take 5 mg by mouth once a week. Sunday, Disp: , Rfl: 3 .  sodium chloride (OCEAN) 0.65 % SOLN nasal spray, Place 1 spray into the nose 3 (three) times daily as needed for congestion. Only applies in the clogged nostril, Disp: , Rfl:   .  spironolactone (ALDACTONE) 25 MG tablet, Take 1 tablet (25 mg total) by mouth daily., Disp: 30 tablet, Rfl: 11 .  tiZANidine (ZANAFLEX) 2 MG tablet, Take 1-2 mg by mouth 3 (three) times daily as needed for muscle spasms. Takes a 1/2-1 tablet depending on pain level, Disp: , Rfl:  .  traZODone (DESYREL) 100 MG tablet, Take 1 tablet (100 mg total) by mouth at bedtime., Disp: 90 tablet, Rfl: 0 .  triamcinolone ointment (  KENALOG) 0.1 %, Apply 1 application topically 3 (three) times daily as needed (EAR IRRITATION). , Disp: , Rfl:  .  warfarin (COUMADIN) 5 MG tablet, TAKE 1/2 TO 1 TABLET BY MOUTH EVERY DAY OR AS DIRECTED BY COUMADIN CLINIC (FILLABLE 09/02/16), Disp: 30 tablet, Rfl: 1   Review of Systems     Objective:   Physical Exam  Constitutional: She is oriented to person, place, and time. She appears well-developed and well-nourished. No distress.  HENT:  Head: Normocephalic and atraumatic.  Right Ear: External ear normal.  Left Ear: External ear normal.  Mouth/Throat: Oropharynx is clear and moist. No oropharyngeal exudate.  Eyes: Pupils are equal, round, and reactive to light. Conjunctivae and EOM are normal. Right eye exhibits no discharge. Left eye exhibits no discharge. No scleral icterus.  Neck: Normal range of motion. Neck supple. No JVD present. No tracheal deviation present. No thyromegaly present.  Cardiovascular: Normal rate, regular rhythm, normal heart sounds and intact distal pulses.  Exam reveals no gallop and no friction rub.   No murmur heard. Pulmonary/Chest: Effort normal and breath sounds normal. No respiratory distress. She has no wheezes. She has no rales. She exhibits no tenderness.  Abdominal: Soft. Bowel sounds are normal. She exhibits no distension and no mass. There is no tenderness. There is no rebound and no guarding.  Musculoskeletal: Normal range of motion. She exhibits no edema or tenderness.  tigger ponts +  Lymphadenopathy:    She has no cervical  adenopathy.  Neurological: She is alert and oriented to person, place, and time. She has normal reflexes. No cranial nerve deficit. She exhibits normal muscle tone. Coordination normal.  Skin: Skin is warm and dry. No rash noted. She is not diaphoretic. No erythema. No pallor.  Psychiatric: She has a normal mood and affect. Her behavior is normal. Judgment and thought content normal.  Vitals reviewed.  Vitals:   05/25/17 0916  BP: 124/85  Pulse: 85  SpO2: 90%  Weight: 183 lb 9.6 oz (83.3 kg)  Height: 5\' 3"  (1.6 m)    Estimated body mass index is 32.52 kg/m as calculated from the following:   Height as of this encounter: 5\' 3"  (1.6 m).   Weight as of this encounter: 183 lb 9.6 oz (83.3 kg).      Assessment:       ICD-10-CM   1. Chronic respiratory failure with hypoxia (HCC) J96.11   2. Other emphysema ([place]) J43.8        Plan:      COPD is stable  Plan  - continue oxygen with exertion, atrovent as before  - understand and respect that these options have limitations but respect fact you do not want to oral inhalers or other nebulizers - any flare up or issue calls use  - ensure proper pain and fibromylagia mgmt   followup  6-9 months or sooner if needed   Dr. Brand Males, M.D., [facility].C.P Pulmonary and Critical Care Medicine Staff Physician [place] Pager: [phone], If no answer or between  15:00h - 7:00h: call [phone]  05/25/2017 9:41 AM

## 2017-05-25 NOTE — Patient Instructions (Addendum)
ICD-10-CM   1. Chronic respiratory failure with hypoxia (HCC) J96.11   2. Other emphysema ([place]) J43.8     COPD is stable  Plan  - continue oxygen with exertion, atrovent as before  - understand and respect that these options have limitations but respect fact you do not want to oral inhalers or other nebulizers - any flare up or issue calls use  - ensure proper pain and fibromylagia mgmt   followup  6-9 months or sooner if needed

## 2017-05-27 ENCOUNTER — Ambulatory Visit (INDEPENDENT_AMBULATORY_CARE_PROVIDER_SITE_OTHER): Payer: Medicare Other | Admitting: Cardiology

## 2017-05-27 ENCOUNTER — Telehealth: Payer: Self-pay | Admitting: Internal Medicine

## 2017-05-27 ENCOUNTER — Other Ambulatory Visit: Payer: Self-pay

## 2017-05-27 ENCOUNTER — Ambulatory Visit (INDEPENDENT_AMBULATORY_CARE_PROVIDER_SITE_OTHER): Payer: Medicare Other | Admitting: Pharmacist Clinician (PhC)/ Clinical Pharmacy Specialist

## 2017-05-27 ENCOUNTER — Encounter: Payer: Self-pay | Admitting: Cardiology

## 2017-05-27 VITALS — BP 116/61 | HR 76 | Ht 63.0 in | Wt 184.0 lb

## 2017-05-27 DIAGNOSIS — I5042 Chronic combined systolic (congestive) and diastolic (congestive) heart failure: Secondary | ICD-10-CM | POA: Diagnosis not present

## 2017-05-27 DIAGNOSIS — I2510 Atherosclerotic heart disease of native coronary artery without angina pectoris: Secondary | ICD-10-CM | POA: Diagnosis not present

## 2017-05-27 DIAGNOSIS — Z7901 Long term (current) use of anticoagulants: Secondary | ICD-10-CM | POA: Diagnosis not present

## 2017-05-27 DIAGNOSIS — I255 Ischemic cardiomyopathy: Secondary | ICD-10-CM

## 2017-05-27 DIAGNOSIS — I10 Essential (primary) hypertension: Secondary | ICD-10-CM | POA: Diagnosis not present

## 2017-05-27 DIAGNOSIS — I480 Paroxysmal atrial fibrillation: Secondary | ICD-10-CM

## 2017-05-27 LAB — POCT INR: INR: 3.4

## 2017-05-27 MED ORDER — WARFARIN SODIUM 5 MG PO TABS: ORAL_TABLET | ORAL | 1 refills | Status: DC

## 2017-05-27 MED ORDER — LOSARTAN POTASSIUM 25 MG PO TABS: 25.0000 mg | ORAL_TABLET | Freq: Every day | ORAL | 11 refills | Status: DC

## 2017-05-27 MED ORDER — CARVEDILOL 12.5 MG PO TABS: 12.5000 mg | ORAL_TABLET | Freq: Two times a day (BID) | ORAL | 11 refills | Status: DC

## 2017-05-27 MED ORDER — FUROSEMIDE 40 MG PO TABS: 40.0000 mg | ORAL_TABLET | Freq: Every day | ORAL | 11 refills | Status: DC

## 2017-05-27 MED ORDER — CARVEDILOL 12.5 MG PO TABS: 12.5000 mg | ORAL_TABLET | Freq: Two times a day (BID) | ORAL | 3 refills | Status: DC

## 2017-05-27 NOTE — Telephone Encounter (Signed)
Per [facility], they provide the SimplyGo which the patient refused -- pt did not want this type, patient wanted the SImplyGo Mini which [facility] does not carry. Pt can be evaluated for a smaller tank - ML6 tank in a carry bag/backpack.  If the patient qualifies for pulsed O2 then this will prolong the tank life and allow longer use than using continuous flow.    Pt cannot pick up anything or carry anything on her back or shoulder that is heavier than 5lbs.  Pt is not wanting to do anything more at this time -- pt is going to keep her current POC.  Nothing further needed.

## 2017-05-27 NOTE — Patient Instructions (Signed)
Increase Coreg ( carvedilol ) 12.5 mg twice a day    Increase Losartan to 25 mg daily     Your physician recommends that you schedule a follow-up appointment in 1 month

## 2017-05-27 NOTE — Telephone Encounter (Signed)
Spoke with pt, who states [facility] now only carries oxygen tanks with wheels. Per pt [facility] states they no longer carry light weight POC, due to battery life not lasting long. Lm for Corene Cornea w/ [facility] for clarification.

## 2017-05-28 ENCOUNTER — Telehealth: Payer: Self-pay | Admitting: Cardiology

## 2017-05-28 NOTE — Telephone Encounter (Signed)
Patient returned call.She stated she did not qualify for Pan, she does not have medicare part D.

## 2017-05-28 NOTE — Telephone Encounter (Signed)
Please call,she needs to give Dr Martinique a message please.

## 2017-05-28 NOTE — Telephone Encounter (Signed)
Returned call to patient no answer.LMTC. 

## 2017-06-01 ENCOUNTER — Other Ambulatory Visit: Payer: Self-pay | Admitting: Family Medicine

## 2017-06-08 ENCOUNTER — Other Ambulatory Visit (HOSPITAL_COMMUNITY): Payer: Self-pay

## 2017-06-08 DIAGNOSIS — F0630 Mood disorder due to known physiological condition, unspecified: Secondary | ICD-10-CM

## 2017-06-08 MED ORDER — TRAZODONE HCL 100 MG PO TABS: 100.0000 mg | ORAL_TABLET | Freq: Every day | ORAL | 0 refills | Status: DC

## 2017-06-10 ENCOUNTER — Ambulatory Visit (INDEPENDENT_AMBULATORY_CARE_PROVIDER_SITE_OTHER): Payer: Medicare Other | Admitting: Pharmacist Clinician (PhC)/ Clinical Pharmacy Specialist

## 2017-06-10 ENCOUNTER — Telehealth: Payer: Self-pay | Admitting: Pharmacist Clinician (PhC)/ Clinical Pharmacy Specialist

## 2017-06-10 DIAGNOSIS — I480 Paroxysmal atrial fibrillation: Secondary | ICD-10-CM | POA: Diagnosis not present

## 2017-06-10 DIAGNOSIS — Z7901 Long term (current) use of anticoagulants: Secondary | ICD-10-CM | POA: Diagnosis not present

## 2017-06-10 LAB — POCT INR: INR: 4.7

## 2017-06-10 NOTE — Telephone Encounter (Signed)
Patient was in for INR check this am.  Was complaining about debilitating pain in legs, to the point where she couldn't walk without crying.  Did some Internet reading and has decided that the cause is her losartan, carvedilol and spironolactone.    Stressed that she cannot stop all 3 medications at the same time because of CHF.  Suggested that she start by d/c the losartan.  Per patient home BP runs from 70-110 systolic and she skips all morning BP meds if systolic < 034 (twice in past 3 weeks).  She will continue with daily home BP checks and should bring her readings with her to next INR appt in 2 weeks or call sooner if her systolic BP increases to > 130.  At that time if the leg pain has resolved we can find a different medication.  Patient voiced understanding.

## 2017-06-26 NOTE — Progress Notes (Signed)
Cardiology Office Note    Date:  06/28/2017   ID:  Laurie, Horton 10-Jan-1946, MRN [id number]  PCP:  Laurie Chick, MD  Cardiologist: Dr. [place]   Chief Complaint  Patient presents with  . Congestive Heart Failure  . Coronary Artery Disease    History of Present Illness:    Laurie Horton is a 71 y.o. female with past medical history of chronic diastolic CHF, PFO, PAF (on Coumadin), COPD (on 2L [place] at baseline), HTN, and prior CVA who is seen for follow up CHF.  She was admitted from 4/9 - 02/12/2017 for worsening dyspnea on exertion and palpitations. Was in atrial fibrillation with RVR upon arrival to the ED. Echo during admission showed a newly reduced EF of 20-25% and she was diuresed with IV Lasix. Was followed by Cardiology and it was thought her cardiomyopathy was possibly of a Takatsubo etiology as she denied any recent anginal symptoms with enzymes being negative and EKG showing no acute ischemic changes. It was recommended to consider a right/left heart cath in 4-6 weeks. Weight was 193 lbs at time of discharge and she was continued on Lasix 40mg  BID along with Coreg 3.125mg  BID and Losartan 12.5 mg daily.  She did undergo right and left heart cath on 03/04/17. This showed severe 2 vessel obstructive CAD with 100% RCA occlusion, 75% OM1, and 90% small OM2. EF 25-30%. Mild pulmonary HTN with normal LV filling pressures. 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 Maximizing CHF therapy recommended. At time of cath it appeared she had more of a multifocal atrial arrhythmia. On her last visit we started aldactone and reduced her lasix dose with the idea of increasing medication as BP allows. Coreg dose was increased.   She called our Pharm D on 8/9 complaining of leg pain that she attributed to her medication. Wanted to stop all her medication but was instructed to stop losartan only and follow. She stopped losartan for 2 weeks and noted no change. She went back on it. Has since  had 2 injections- one in each hip with some improvement. Notes she was just depressed. She denies any SOB or chest pain.    Past Medical History:  Diagnosis Date  . CHF (congestive heart failure) (HCC) 03/02/2017   EF 25-30% 2018  . Complication of anesthesia    various issues with oxygen  saturations post op  . COPD (chronic obstructive pulmonary disease) (HCC)   . Depression   . Diverticulitis   . DVT (deep venous thrombosis) (HCC)   . Fibromyalgia   . GERD (gastroesophageal reflux disease)   . Hiatal hernia   . History of deviated nasal septum    left- side  . HTN (hypertension)   . Hyperlipidemia   . Obesity   . On supplemental oxygen therapy    concentrator at night @ 1.5 l/m or when sleeps. O2 Sat niormally 87.  . Osteoporosis   . PAT (paroxysmal atrial tachycardia) (HCC)   . PFO (patent foramen ovale)   . PULMONARY NODULE, LEFT LOWER LOBE 10/14/2009   5mm LLL nodule dec 2010. Stable and 4mm in Oct 2012. No further fu  . Right middle lobe pneumonia (HCC) 07/24/2011   First noted at admit 07/10/11. Persists on cxr 07/22/11. Cleared on CT 08/24/11. No further followup  . Stroke (HCC)   . TOBACCO ABUSE 06/04/2009    Past Surgical History:  Procedure Laterality Date  . CARDIOVASCULAR STRESS TEST  12/26/2004   EF 74%.  NO EVIDENCE OF ISCHEMIA  . ESOPHAGOGASTRODUODENOSCOPY (EGD) WITH PROPOFOL N/A 04/15/2015   Procedure: ESOPHAGOGASTRODUODENOSCOPY (EGD) WITH PROPOFOL;  Surgeon: Carman Ching, MD;  Location: [facility];  Service: Endoscopy;  Laterality: N/A;  . KNEE ARTHROSCOPY  2000   left  . LAPAROSCOPIC CHOLECYSTECTOMY  04-16-2010   [place]  . MOUTH SURGERY     03-26-15 multiple extractions stitches remains  . RIGHT/LEFT HEART CATH AND CORONARY ANGIOGRAPHY N/A 03/04/2017   Procedure: Right/Left Heart Cath and Coronary Angiography;  Surgeon: Makhari Dovidio M [place], MD;  [facility] INV[facility]IVE CV LAB;  Service: Cardiovascular;  Laterality: N/A;  . TOTAL ABDOMINAL HYSTERECTOMY     post op  needed oxygen was told "she gave them a scare"  . TUBAL LIGATION    . US ECHOCARDIOGRAPHY  11/20/2009   EF 55-60%    Current Medications: Outpatient Medications Prior to Visit  Medication Sig Dispense Refill  . carvedilol (COREG) 12.5 MG tablet Take 1 tablet (12.5 mg total) by mouth 2 (two) times daily. 60 tablet 11  . desvenlafaxine (PRISTIQ) 50 MG 24 hr tablet TAKE 1 TABLET (50 MG TOTAL) BY MOUTH DAILY. 90 tablet 0  . diazepam (VALIUM) 2 MG tablet Take 2 mg by mouth 2 (two) times daily as needed for anxiety.     . furosemide (LASIX) 40 MG tablet Take 1 tablet (40 mg total) by mouth daily. 30 tablet 11  . gabapentin (NEURONTIN) 100 MG capsule Take 100-200 mg by mouth 3 (three) times daily. 100 mg in am, 100 mg at noon and  200 mg at bedtime    . HYDROcodone-acetaminophen (NORCO/VICODIN) 5-325 MG tablet Take 1 tablet by mouth 3 (three) times daily.     Marland Kitchen ipratropium (ATROVENT) 0.02 % nebulizer solution USE 1 VIAL BY NEBULIZATION 4 (FOUR) TIMES DAILY. 75 mL 3  . losartan (COZAAR) 25 MG tablet Take 1 tablet (25 mg total) by mouth daily. 30 tablet 11  . montelukast (SINGULAIR) 10 MG tablet TAKE 1 TABLET BY MOUTH AT BEDTIME 90 tablet 0  . ondansetron (ZOFRAN ODT) 8 MG disintegrating tablet Take 1 tablet (8 mg total) by mouth every 8 (eight) hours as needed for nausea or vomiting. 20 tablet 0  . oxybutynin (DITROPAN-XL) 10 MG 24 hr tablet Take 10 mg by mouth daily.     . pantoprazole (PROTONIX) 40 MG tablet Take 1 tablet by mouth every morning.     . polyethylene glycol (MIRALAX / GLYCOLAX) packet Take by mouth daily as needed for mild constipation. TAKES TEASPOONFUL IN 8 OZ OF WATER    . rosuvastatin (CRESTOR) 5 MG tablet Take 5 mg by mouth once a week. Sunday  3  . sodium chloride (OCEAN) 0.65 % SOLN nasal spray Place 1 spray into the nose 3 (three) times daily as needed for congestion. Only applies in the clogged nostril    . spironolactone (ALDACTONE) 25 MG tablet Take 1 tablet (25 mg total) by  mouth daily. 30 tablet 11  . tiZANidine (ZANAFLEX) 2 MG tablet Take 1-2 mg by mouth 3 (three) times daily as needed for muscle spasms. Takes a 1/2-1 tablet depending on pain level    . traZODone (DESYREL) 100 MG tablet Take 1 tablet (100 mg total) by mouth at bedtime. 90 tablet 0  . triamcinolone ointment (KENALOG) 0.1 % Apply 1 application topically 3 (three) times daily as needed (EAR IRRITATION).     Marland Kitchen warfarin (COUMADIN) 5 MG tablet TAKE 1/2 TO 1 TABLET BY MOUTH EVERY DAY OR AS DIRECTED BY  COUMADIN CLINIC (FILLABLE 09/02/16) 30 tablet 1   No facility-administered medications prior to visit.      Allergies:   Alprazolam; Bee venom; Iodine; Pseudoephedrine hcl er; Budesonide-formoterol fumarate; Crestor [rosuvastatin calcium]; Esomeprazole magnesium; Flonase [fluticasone propionate]; Loratadine; Lotrimin [clotrimazole]; Lunesta [eszopiclone]; Oxcarbazepine; Statins; Zolpidem tartrate; Clarithromycin; Aciphex [rabeprazole sodium]; Avelox [moxifloxacin hcl in nacl]; Bextra [valdecoxib]; Ceclor [cefaclor]; Cephalexin; Covera-hs [verapamil hcl]; Dicyclomine hcl; Other; and Tessalon perles   Social History   Social History  . Marital status: Divorced    Spouse name: N/A  . Number of children: 2  . Years of education: N/A   Occupational History  . disability    Social History Main Topics  . Smoking status: Former Smoker    Quit date: 06/02/2010  . Smokeless tobacco: Never Used  . Alcohol use No  . Drug use: No  . Sexual activity: Yes    Partners: Male    Birth control/ protection: None   Other Topics Concern  . None   Social History Narrative   Marital status: divorced in 1978 after ten years; dating casually      Children: 3 biological children; 3 court appointed children; 6 grandchildren; 5 gg      Lives: alone; children in [place] and [place].      Employment: retired age 54; disability for COPD, CVA at age 25.      Tobacco: former smoker; quit smoking 2011.      Alcohol:  none      Exercise: walks dog four times per day; goes to pool three times per week.       ADLs: drives; no assistant devices; does have a walker.  Cleaning is limited in 2018.  Daughter helps with cleaning.  Does own grocery shopping.      Advanced Directives: YES; DNR/DNI; HCPOA:            Family History:  The patient's family history includes Alzheimer's disease in her mother; Dementia in her mother; Diabetes in her mother and sister; Heart attack in her father; Heart attack (age of onset: 18) in her brother; Schizophrenia in her sister; Tremor in her sister.   Review of Systems:   As noted in HPI.  All other systems reviewed and are otherwise negative except as noted above.   Physical Exam:    VS:  BP 139/79 Comment: pt brought home cuff, refused to have manual bp checked.  Pulse 62   Ht 5\' 3"  (1.6 m)   Wt 183 lb 9.6 oz (83.3 kg)   BMI 32.52 kg/m    General: Well developed, well nourished Caucasian female appearing in no acute distress.  Head: Normocephalic, atraumatic, sclera non-icteric, no xanthomas, nares are without discharge.  Neck: No carotid bruits. JVD not elevated.  Lungs: Clear Heart: Regular rate and rhythm. No S3 or S4.  No murmur, no rubs, or gallops appreciated. Abdomen: Soft, non-tender, non-distended with normoactive bowel sounds. No hepatomegaly. No rebound/guarding. No obvious abdominal masses. Msk:  Strength and tone appear normal for age. No joint deformities or effusions. Extremities: No clubbing or cyanosis. No edema.  Distal pedal pulses are 2+ bilaterally. Neuro: Alert and oriented X 3. Moves all extremities spontaneously. No focal deficits noted. Psych:  Responds to questions appropriately with a normal affect. Skin: No rashes or lesions noted  Wt Readings from Last 3 Encounters:  06/28/17 183 lb 9.6 oz (83.3 kg)  05/27/17 184 lb (83.5 kg)  05/25/17 183 lb 9.6 oz (83.3 kg)     Studies/Labs  Reviewed:   EKG:  EKG is not ordered today.   Recent  Labs: 12/09/2016: ALT 17 02/11/2017: Magnesium 1.8 02/12/2017: B Natriuretic Peptide 523.2 02/24/2017: Hemoglobin 11.3; Platelets 272 04/22/2017: BUN 13; Creatinine, Ser 0.90; Potassium 4.7; Sodium 138   Lipid Panel    Component Value Date/Time   CHOL 207 (H) 12/09/2016 1328   TRIG 221 (H) 12/09/2016 1328   HDL 35 (L) 12/09/2016 1328   CHOLHDL 5.9 (H) 12/09/2016 1328   CHOLHDL 4.6 07/02/2016 0823   VLDL 25 07/02/2016 0823   LDLCALC 128 (H) 12/09/2016 1328    Additional studies/ records that were reviewed today include:   Echocardiogram: 02/09/2017 Study Conclusions  - Left ventricle: The cavity size was mildly dilated. Wall   thickness was increased in a pattern of mild LVH. Systolic   function was severely reduced. The estimated ejection fraction   was in the range of 20% to 25%. Diffuse hypokinesis. Doppler   parameters are consistent with abnormal left ventricular   relaxation (grade 1 diastolic dysfunction). - Aortic valve: Valve area (Vmax): 1.4 cm^2. - Aortic root: The aortic root was mildly dilated. - Ascending aorta: The ascending aorta was mildly dilated. - Mitral valve: Calcified annulus. There was moderate   regurgitation. - Left atrium: The atrium was moderately dilated. - Right ventricle: Systolic function was moderately reduced. - Pulmonary arteries: Systolic pressure was mildly increased. - Pericardium, extracardiac: A trivial pericardial effusion was   identified.  Impressions:  - No subcostal views; severe global reduction in LV systolic   function; grade 1 diastolic dysfunction; mildly dilated aortic   root and ascending aorta; moderate MR; moderaet LAE; moderately   reduced RV function; mild TR; mildly elevated pulmonary pressure.  Procedures   Right/Left Heart Cath and Coronary Angiography  Conclusion     Ost 1st Mrg to 1st Mrg lesion, 75 %stenosed.  2nd Mrg lesion, 90 %stenosed.  Ost RCA to Mid RCA lesion, 100 %stenosed.  There is severe  left ventricular systolic dysfunction.  LV end diastolic pressure is normal.  The left ventricular ejection fraction is 25-35% by visual estimate.  Hemodynamic findings consistent with mild pulmonary hypertension.  LV end diastolic pressure is normal.   1. Severe 2 vessel obstructive CAD    - 75% proximal OM1    - 90% small OM2    - 100% proximal RCA. Left to right collaterals.  2. Severe LV dysfunction 3. Mild pulmonary HTN with normal LV filling pressures.  4. Cardiac index 2.41 L/min/BSA   Plan: Medical management to try and optimize CHF therapy. Patient appears to be adequately diuresed at this time. Based on these results her cardiomyopathy is ischemic. I would treat her CAD medically. If cardiac cath is needed in the future would consider alternative access given difficulty from the right radial approach. Her rhythm during procedure is a multifocal atrial rhythm/tachycardia.       Assessment:    1. Chronic combined systolic and diastolic heart failure (HCC)   2. Coronary artery disease involving native coronary artery of native heart without angina pectoris   3. Ischemic cardiomyopathy   4. PAF (paroxysmal atrial fibrillation) (HCC)      Plan:   In order of problems listed above:  1. Chronic Combined Systolic and Diastolic CHF/ New Cardiomyopathy - EF 25-30% with ischemic cardiomyopathy. Not a candidate for revascularization.  - she appears to be euvolemic at this time. - continue BB, ARB, aldactone, and statin. Will continue current therapy. Not a candidate for [employer]  due to cost.  We will repeat an Echo at this time. If EF still low will refer to EP to consider  for ICD.  2. Paroxysmal Atrial Fibrillation/ multifocal atrial tachycardia - This patients CHA2DS2-VASc Score and unadjusted Ischemic Stroke Rate (% per year) is equal to 9.7 % stroke rate/year from a score of 6 (CHF, HTN, Female, Age, CVA (2)). She denies any evidence of active bleeding. Continue  Coumadin for anticoagulation. INR today 2.5 and pharmacy adjusted.   3. HTN- controlled.   4. COPD-  - followed by Pulmonology.   5. PFO - no plans for closure   6. Fibromyalgia.  7. Depression   I will follow up in 4 months.   Signed, Abby Tucholski [place], MD  06/28/2017 3:43 PM    [facility] [address] [place], [address] [place], [place]  [address] Phone: [phone]; Fax: [phone]  [address] [place], [place] [address] Phone: [phone]

## 2017-06-28 ENCOUNTER — Ambulatory Visit (INDEPENDENT_AMBULATORY_CARE_PROVIDER_SITE_OTHER): Payer: Medicare Other | Admitting: Cardiology

## 2017-06-28 ENCOUNTER — Ambulatory Visit (INDEPENDENT_AMBULATORY_CARE_PROVIDER_SITE_OTHER): Payer: Medicare Other | Admitting: Pharmacist Clinician (PhC)/ Clinical Pharmacy Specialist

## 2017-06-28 ENCOUNTER — Encounter: Payer: Self-pay | Admitting: Cardiology

## 2017-06-28 VITALS — BP 139/79 | HR 62 | Ht 63.0 in | Wt 183.6 lb

## 2017-06-28 DIAGNOSIS — Z7901 Long term (current) use of anticoagulants: Secondary | ICD-10-CM | POA: Diagnosis not present

## 2017-06-28 DIAGNOSIS — I255 Ischemic cardiomyopathy: Secondary | ICD-10-CM

## 2017-06-28 DIAGNOSIS — I480 Paroxysmal atrial fibrillation: Secondary | ICD-10-CM

## 2017-06-28 DIAGNOSIS — I5042 Chronic combined systolic (congestive) and diastolic (congestive) heart failure: Secondary | ICD-10-CM

## 2017-06-28 DIAGNOSIS — I2510 Atherosclerotic heart disease of native coronary artery without angina pectoris: Secondary | ICD-10-CM | POA: Diagnosis not present

## 2017-06-28 LAB — POCT INR: INR: 2.5

## 2017-06-28 NOTE — Patient Instructions (Signed)
Continue your current therapy  We will schedule you for an Echocardiogram  I will see you in 4 months  

## 2017-07-08 ENCOUNTER — Ambulatory Visit (HOSPITAL_COMMUNITY): Payer: Medicare Other | Attending: Cardiovascular Disease

## 2017-07-08 ENCOUNTER — Other Ambulatory Visit: Payer: Self-pay

## 2017-07-08 DIAGNOSIS — I5042 Chronic combined systolic (congestive) and diastolic (congestive) heart failure: Secondary | ICD-10-CM | POA: Diagnosis not present

## 2017-07-08 DIAGNOSIS — I255 Ischemic cardiomyopathy: Secondary | ICD-10-CM

## 2017-07-08 DIAGNOSIS — I480 Paroxysmal atrial fibrillation: Secondary | ICD-10-CM | POA: Insufficient documentation

## 2017-07-08 DIAGNOSIS — I2510 Atherosclerotic heart disease of native coronary artery without angina pectoris: Secondary | ICD-10-CM | POA: Diagnosis not present

## 2017-07-09 ENCOUNTER — Telehealth: Payer: Self-pay | Admitting: Cardiology

## 2017-07-09 DIAGNOSIS — M79604 Pain in right leg: Secondary | ICD-10-CM

## 2017-07-09 DIAGNOSIS — M79605 Pain in left leg: Principal | ICD-10-CM

## 2017-07-09 NOTE — Telephone Encounter (Signed)
Received a call back from patient.She wanted to ask Dr.Jordan why she is unable to walk 20 feet due to pain in both legs.She wanted to ask Dr.Jordan if she needed to have dopplers done or does she need to see a orthopedic Dr.Message sent to [place].

## 2017-07-09 NOTE — Telephone Encounter (Signed)
By all means lets do LE arterial dopplers. If these are OK she can see ortho.  Corderius Saraceni [place] MD, [facility]

## 2017-07-09 NOTE — Telephone Encounter (Signed)
Returned call to patient no answer.LMTC. 

## 2017-07-09 NOTE — Telephone Encounter (Signed)
Agree. She seems to blame her meds for whatever problem she is encountering that day but as seen by echo results her medications are essential.  Jerimie Mancuso [place] MD, [facility]

## 2017-07-09 NOTE — Telephone Encounter (Signed)
New message   Pt is returning a call to [facility]

## 2017-07-09 NOTE — Telephone Encounter (Signed)
Returned call to patient echo results given.Stated she stopped losartan for a few days because she is having weakness in both legs.Stated she restarted.Advised she needs to keep taking losartan.Stated she has been off Crestor for 1 month but,cannot walk 20 feet with out having to stop due to pain in both legs.Advised to restart Crestor 5 mg every Sundays. Advised I will send message to [place] for advice.

## 2017-07-16 ENCOUNTER — Ambulatory Visit (HOSPITAL_COMMUNITY): Payer: Self-pay | Admitting: Psychiatry

## 2017-07-16 NOTE — Telephone Encounter (Signed)
Spoke to patient Dr.Jordan advised ok to schedule le arterial dopplers.Schedulers will call back to schedule.

## 2017-07-16 NOTE — Telephone Encounter (Signed)
Returned call to patient no answer.LMTC. 

## 2017-07-19 ENCOUNTER — Other Ambulatory Visit: Payer: Self-pay | Admitting: Cardiology

## 2017-07-19 DIAGNOSIS — I739 Peripheral vascular disease, unspecified: Secondary | ICD-10-CM

## 2017-07-20 ENCOUNTER — Ambulatory Visit (INDEPENDENT_AMBULATORY_CARE_PROVIDER_SITE_OTHER): Payer: Medicare Other | Admitting: Pharmacist Clinician (PhC)/ Clinical Pharmacy Specialist

## 2017-07-20 DIAGNOSIS — I480 Paroxysmal atrial fibrillation: Secondary | ICD-10-CM

## 2017-07-20 DIAGNOSIS — Z7901 Long term (current) use of anticoagulants: Secondary | ICD-10-CM

## 2017-07-20 DIAGNOSIS — I4891 Unspecified atrial fibrillation: Secondary | ICD-10-CM

## 2017-07-20 LAB — POCT INR: INR: 3.5

## 2017-07-21 ENCOUNTER — Ambulatory Visit (HOSPITAL_COMMUNITY)
Admission: RE | Admit: 2017-07-21 | Payer: Medicare Other | Source: Ambulatory Visit | Attending: Cardiology | Admitting: Cardiology

## 2017-07-26 ENCOUNTER — Other Ambulatory Visit: Payer: Self-pay | Admitting: Family Medicine

## 2017-07-26 ENCOUNTER — Other Ambulatory Visit: Payer: Self-pay | Admitting: Cardiology

## 2017-07-26 DIAGNOSIS — Z1231 Encounter for screening mammogram for malignant neoplasm of breast: Secondary | ICD-10-CM

## 2017-07-26 DIAGNOSIS — I6523 Occlusion and stenosis of bilateral carotid arteries: Secondary | ICD-10-CM

## 2017-07-27 ENCOUNTER — Encounter (HOSPITAL_COMMUNITY): Payer: Self-pay | Admitting: Psychiatry

## 2017-07-27 ENCOUNTER — Ambulatory Visit (INDEPENDENT_AMBULATORY_CARE_PROVIDER_SITE_OTHER): Payer: Medicare Other | Admitting: Psychiatry

## 2017-07-27 VITALS — BP 129/74 | HR 72 | Ht 64.0 in | Wt 184.8 lb

## 2017-07-27 DIAGNOSIS — F331 Major depressive disorder, recurrent, moderate: Secondary | ICD-10-CM | POA: Diagnosis not present

## 2017-07-27 DIAGNOSIS — Z87891 Personal history of nicotine dependence: Secondary | ICD-10-CM | POA: Diagnosis not present

## 2017-07-27 DIAGNOSIS — Z79899 Other long term (current) drug therapy: Secondary | ICD-10-CM | POA: Diagnosis not present

## 2017-07-27 DIAGNOSIS — Z818 Family history of other mental and behavioral disorders: Secondary | ICD-10-CM | POA: Diagnosis not present

## 2017-07-27 MED ORDER — ESCITALOPRAM OXALATE 10 MG PO TABS: 10.0000 mg | ORAL_TABLET | Freq: Every day | ORAL | 0 refills | Status: DC

## 2017-07-27 NOTE — Progress Notes (Signed)
[facility] MD/PA/NP OP Progress Note  07/27/2017 8:32 AM Laurie Horton  MRN:  [id number]  Chief Complaint:  I try to increase Pristiq but it make me goofy.  I'm still depressed.  HPI: Laurie Horton came for her follow-up appointment.  On her last visit we increase Pristiq 100 mg to help with residual depression but she couldn't tolerate very well.  She endorsed that it makes her goofy and she stopped.  She is concerned about her physical health.  Recently she's seen cardiologist and she scheduled to have Doppler's test.  She could easily tired and having shortness of breath.  She has low energy and after 10 minutes she couldn't walk and she need to sit down.  She is upset on her daughter who lives in [place] and recently got married and now having issues with her husband.  She is taking multiple pain medication and some time she feels they're not working as good.  She admitted crying spells, isolated, withdrawn and sometimes hopelessness.  However she denies any suicidal thoughts or homicidal thought.  She lives with her cats and dogs.  She denies drinking alcohol or using any illegal substances.  She is seeing cardiologist, and Dr. Greta Doom for chronic pain.  Patient denies any paranoia, hallucination, self abusive behavior.  She is not happy that she is taking 14 medication for her chronic health needs.  Patient is not interested in counseling due to financial strain.  She let her try a different antidepressant.  Visit Diagnosis:    ICD-10-CM   1. MDD (major depressive disorder), recurrent episode, moderate ([place]) F33.1     Past Psychiatric History: Reviewed. Patient denies any previous history of psychiatric inpatient treatment, suicidal attempt, paranoia, hallucination or any mania. She started seeing psychiatrist in 2006 after she had a stroke. She had tried Cymbalta and Celexa in the pastWith limited response.  She tried Ambien, Lunesta and Xanax but developed allergies and side effects.  We tried  increasing Pristiq but she couldn't handle the side effects.  Past Medical History:  Past Medical History:  Diagnosis Date  . CHF (congestive heart failure) ([place]) 03/02/2017   EF 25-30% 2018  . Complication of anesthesia    various issues with oxygen  saturations post op  . COPD (chronic obstructive pulmonary disease) ([place])   . Depression   . Diverticulitis   . DVT (deep venous thrombosis) ([place])   . Fibromyalgia   . GERD (gastroesophageal reflux disease)   . Hiatal hernia   . History of deviated nasal septum    left- side  . HTN (hypertension)   . Hyperlipidemia   . Obesity   . On supplemental oxygen therapy    concentrator at night @ 1.5 l/m or when sleeps. O2 Sat niormally 87.  . Osteoporosis   . PAT (paroxysmal atrial tachycardia) ([place])   . PFO (patent foramen ovale)   . PULMONARY NODULE, LEFT LOWER LOBE 10/14/2009   70mm LLL nodule dec 2010. Stable and 21mm in Oct 2012. No further fu  . Right middle lobe pneumonia ([place]) 07/24/2011   First noted at admit 07/10/11. Persists on cxr 07/22/11. Cleared on CT 08/24/11. No further followup  . Stroke ([place])   . TOBACCO ABUSE 06/04/2009    Past Surgical History:  Procedure Laterality Date  . CARDIOVASCULAR STRESS TEST  12/26/2004   EF 74%. NO EVIDENCE OF ISCHEMIA  . ESOPHAGOGASTRODUODENOSCOPY (EGD) WITH PROPOFOL N/A 04/15/2015   Procedure: ESOPHAGOGASTRODUODENOSCOPY (EGD) WITH PROPOFOL;  Surgeon: Laurence Spates, MD;  Location: [facility];  Service: Endoscopy;  Laterality: N/A;  . KNEE ARTHROSCOPY  2000   left  . LAPAROSCOPIC CHOLECYSTECTOMY  04-16-2010   [facility]  . MOUTH SURGERY     03-26-15 multiple extractions stitches remains  . RIGHT/LEFT HEART CATH AND CORONARY ANGIOGRAPHY N/A 03/04/2017   Procedure: Right/Left Heart Cath and Coronary Angiography;  Surgeon: Peter M Martinique, MD;  Location: [facility];  Service: Cardiovascular;  Laterality: N/A;  . TOTAL ABDOMINAL HYSTERECTOMY     post op needed oxygen was told "she gave them a  scare"  . TUBAL LIGATION    . US ECHOCARDIOGRAPHY  11/20/2009   EF 55-60%    Family Psychiatric History: Reviewed.  Family History:  Family History  Problem Relation Age of Onset  . Dementia Mother   . Diabetes Mother   . Alzheimer's disease Mother   . Heart attack Brother 58  . Heart attack Father   . Schizophrenia Sister   . Diabetes Sister   . Tremor Sister     Social History:  Social History   Social History  . Marital status: Divorced    Spouse name: N/A  . Number of children: 2  . Years of education: N/A   Occupational History  . disability    Social History Main Topics  . Smoking status: Former Smoker    Quit date: 06/02/2010  . Smokeless tobacco: Never Used  . Alcohol use No  . Drug use: No  . Sexual activity: Yes    Partners: Male    Birth control/ protection: None   Other Topics Concern  . None   Social History Narrative   Marital status: divorced in 1978 after ten years; dating casually      Children: 3 biological children; 3 court appointed children; 6 grandchildren; 5 gg      Lives: alone; children in [place] and [place].      Employment: retired age 15; disability for COPD, CVA at age 28.      Tobacco: former smoker; quit smoking 2011.      Alcohol: none      Exercise: walks dog four times per day; goes to pool three times per week.       ADLs: drives; no assistant devices; does have a walker.  Cleaning is limited in 2018.  Daughter helps with cleaning.  Does own grocery shopping.      Advanced Directives: YES; DNR/DNI; HCPOA:           Allergies:  Allergies  Allergen Reactions  . Alprazolam Other (See Comments)    REACTION: stops breathing  . Bee Venom Anaphylaxis  . Iodine Anaphylaxis and Swelling    REACTION: swelling in throat  . Pseudoephedrine Hcl Er Shortness Of Breath  . Budesonide-Formoterol Fumarate Other (See Comments)    Blisters inside of mouth all over  . Crestor [Rosuvastatin Calcium] Other (See Comments)    Unable  to walk  . Esomeprazole Magnesium Other (See Comments)    REACTION: "bouncing off walls"  . Flonase [Fluticasone Propionate] Other (See Comments)    NOSE BLEED  . Loratadine Other (See Comments)    claritin D causes shaking  . Lotrimin [Clotrimazole] Other (See Comments)    blisters  . Lunesta [Eszopiclone] Other (See Comments)    REACTION: "slept for a week"  . Oxcarbazepine Other (See Comments)    Causes deep sleep and dizziness  . Statins Other (See Comments)    Can't walk, legs won't work   . Zolpidem Tartrate  Other (See Comments)    REACTION: "slept for a week"  . Clarithromycin Other (See Comments)    All "mycins" Puts into "a" fib Will take if has to for severe sinus infection  . Aciphex [Rabeprazole Sodium] Rash  . Avelox [Moxifloxacin Hcl In Nacl] Other (See Comments)    Stomach cramps.   Darlin Coco [Valdecoxib] Rash  . Ceclor [Cefaclor] Rash  . Cephalexin Rash    Pt states that she is possibly allergic to this - had a reaction to Cefaclor in the past and she does not want to these class drugs. Added per patient request.  . Covera-Hs [Verapamil Hcl] Palpitations  . Dicyclomine Hcl Rash  . Other Other (See Comments)    Glue from ekg/heart monitor leads --rash +  Any MYCINS  . Tessalon Perles Rash    Metabolic Disorder Labs: Lab Results  Component Value Date   HGBA1C 6.0 02/06/2016   MPG 120 (H) 05/04/2013   No results found for: PROLACTIN Lab Results  Component Value Date   CHOL 207 (H) 12/09/2016   TRIG 221 (H) 12/09/2016   HDL 35 (L) 12/09/2016   CHOLHDL 5.9 (H) 12/09/2016   VLDL 25 07/02/2016   LDLCALC 128 (H) 12/09/2016   LDLCALC 155 (H) 07/02/2016   Lab Results  Component Value Date   TSH 1.960 08/16/2015   TSH 2.82 10/15/2014    Therapeutic Level Labs: No results found for: LITHIUM No results found for: VALPROATE No components found for:  CBMZ  Current Medications: Current Outpatient Prescriptions  Medication Sig Dispense Refill  .  carvedilol (COREG) 12.5 MG tablet Take 1 tablet (12.5 mg total) by mouth 2 (two) times daily. 60 tablet 11  . desvenlafaxine (PRISTIQ) 50 MG 24 hr tablet TAKE 1 TABLET (50 MG TOTAL) BY MOUTH DAILY. 90 tablet 0  . diazepam (VALIUM) 2 MG tablet Take 2 mg by mouth 2 (two) times daily as needed for anxiety.     . furosemide (LASIX) 40 MG tablet Take 1 tablet (40 mg total) by mouth daily. 30 tablet 11  . gabapentin (NEURONTIN) 100 MG capsule Take 100-200 mg by mouth 3 (three) times daily. 100 mg in am, 100 mg at noon and  200 mg at bedtime    . HYDROcodone-acetaminophen (NORCO/VICODIN) 5-325 MG tablet Take 1 tablet by mouth 3 (three) times daily.     Marland Kitchen ipratropium (ATROVENT) 0.02 % nebulizer solution USE 1 VIAL BY NEBULIZATION 4 (FOUR) TIMES DAILY. 75 mL 3  . losartan (COZAAR) 25 MG tablet Take 1 tablet (25 mg total) by mouth daily. 30 tablet 11  . montelukast (SINGULAIR) 10 MG tablet TAKE 1 TABLET BY MOUTH AT BEDTIME 90 tablet 0  . ondansetron (ZOFRAN ODT) 8 MG disintegrating tablet Take 1 tablet (8 mg total) by mouth every 8 (eight) hours as needed for nausea or vomiting. 20 tablet 0  . oxybutynin (DITROPAN-XL) 10 MG 24 hr tablet Take 10 mg by mouth daily.     . pantoprazole (PROTONIX) 40 MG tablet Take 1 tablet by mouth every morning.     . polyethylene glycol (MIRALAX / GLYCOLAX) packet Take by mouth daily as needed for mild constipation. TAKES TEASPOONFUL IN 8 OZ OF WATER    . rosuvastatin (CRESTOR) 5 MG tablet Take 5 mg by mouth once a week. Sunday  3  . sodium chloride (OCEAN) 0.65 % SOLN nasal spray Place 1 spray into the nose 3 (three) times daily as needed for congestion. Only applies in the clogged nostril    .  spironolactone (ALDACTONE) 25 MG tablet Take 1 tablet (25 mg total) by mouth daily. 30 tablet 11  . tiZANidine (ZANAFLEX) 2 MG tablet Take 1-2 mg by mouth 3 (three) times daily as needed for muscle spasms. Takes a 1/2-1 tablet depending on pain level    . traZODone (DESYREL) 100 MG  tablet Take 1 tablet (100 mg total) by mouth at bedtime. 90 tablet 0  . triamcinolone ointment (KENALOG) 0.1 % Apply 1 application topically 3 (three) times daily as needed (EAR IRRITATION).     Marland Kitchen warfarin (COUMADIN) 5 MG tablet TAKE 1/2 TO 1 TABLET BY MOUTH EVERY DAY OR AS DIRECTED BY COUMADIN CLINIC (FILLABLE 09/02/16) 30 tablet 1   No current facility-administered medications for this visit.      Musculoskeletal: Strength & Muscle Tone: within normal limits Gait & Station: normal Patient leans: N/A  Psychiatric Specialty Exam: Review of Systems  Constitutional: Positive for malaise/fatigue.  HENT: Negative.   Eyes: Negative.   Respiratory: Positive for shortness of breath.   Musculoskeletal: Positive for back pain and joint pain.  Skin: Negative.  Negative for itching and rash.  Psychiatric/Behavioral: Positive for depression. The patient is nervous/anxious.     Blood pressure 129/74, pulse 72, height 5\' 4"  (1.626 m), weight 184 lb 12.8 oz (83.8 kg).Body mass index is 31.72 kg/m.  General Appearance: Casual  Eye Contact:  Fair  Speech:  Slow  Volume:  Normal  Mood:  Depressed and tired  Affect:  Labile  Thought Process:  Goal Directed  Orientation:  Full (Time, Place, and Person)  Thought Content: Rumination   Suicidal Thoughts:  No  Homicidal Thoughts:  No  Memory:  Immediate;   Fair Recent;   Fair Remote;   Good  Judgement:  Good  Insight:  Good  Psychomotor Activity:  Normal  Concentration:  Concentration: Fair and Attention Span: Fair  Recall:  Good  Fund of Knowledge: Good  Language: Good  Akathisia:  No  Handed:  Right  AIMS (if indicated): not done  Assets:  Communication Skills Housing Resilience  ADL's:  Intact  Cognition: WNL  Sleep:  Fair   Screenings: PHQ2-9     Office Visit from 04/28/2017 in [facility] at [facility] from 02/08/2017 in [facility] from 12/01/2016 in [facility] from  09/12/2016 in Primary Care at [facility] from 09/01/2016 in Primary Care at [facility] Total Score  0  0  0  0  0       Assessment and Plan: Major depressive disorder, recurrent.  I review collateral information from other providers including recent blood work results and current medication.  Patient is able to see a cardiologist on December 11 and she is having Doppler's test tomorrow.  I would discontinue Pristiq since patient is not feeling better and we will try Lexapro.  Discuss cross titration that she will start Lexapro 5 mg per 1 week and then 10 mg daily and she will cut down Pristiq 50 mg every other day for one week and then stop.  Recommended counseling but patient declined.  Encouraged to keep appointment with her pain specialist and cardiology.  Discuss safety concern that anytime having active suicidal thoughts or homicidal thoughts and she need to call 911 or go to the local emergency room.  Recommended to call us back if she feels worsening of the symptom or if she has any question.  Follow-up in 4 weeks.  Time spent 25 minutes.     Corrissa Martello T., MD 07/27/2017, 8:32 AM

## 2017-07-28 ENCOUNTER — Telehealth: Payer: Self-pay | Admitting: Internal Medicine

## 2017-07-28 ENCOUNTER — Ambulatory Visit (HOSPITAL_COMMUNITY)
Admission: RE | Admit: 2017-07-28 | Discharge: 2017-07-28 | Disposition: A | Discharge status: Home / Self Care | Payer: Medicare Other | Source: Ambulatory Visit | Attending: Cardiology | Admitting: Cardiology

## 2017-07-28 DIAGNOSIS — I6523 Occlusion and stenosis of bilateral carotid arteries: Secondary | ICD-10-CM

## 2017-07-28 DIAGNOSIS — M79604 Pain in right leg: Secondary | ICD-10-CM | POA: Diagnosis not present

## 2017-07-28 DIAGNOSIS — M79605 Pain in left leg: Secondary | ICD-10-CM | POA: Diagnosis not present

## 2017-07-28 DIAGNOSIS — I739 Peripheral vascular disease, unspecified: Secondary | ICD-10-CM | POA: Diagnosis not present

## 2017-07-28 NOTE — Telephone Encounter (Signed)
Patient stated her Helios (oxygen machine) is broken. Patient stated she need oxygen when she goes out.

## 2017-07-28 NOTE — Telephone Encounter (Signed)
ATC patient. She does not VM setup. Need to get more information since the original message did not make sense.

## 2017-07-28 NOTE — Telephone Encounter (Signed)
ATC [facility]. Was also placed on a long hold.   Called patient to let her know that we will contact [facility] for her, but received a message stating she was not receiving calls.

## 2017-07-29 NOTE — Telephone Encounter (Signed)
Attempted to call the pt but it stated that the person is not accepting calls at this time.

## 2017-08-02 NOTE — Telephone Encounter (Signed)
Patient was advised to call [facility] they provide the liquid O2 for this patient.  This was done on 09/27, so patient should be okay now.  Corene Cornea [facility], [place] is [phone].

## 2017-08-02 NOTE — Telephone Encounter (Signed)
Called Laurie Horton with [facility] and lmomtcb x 1.

## 2017-08-03 ENCOUNTER — Ambulatory Visit (INDEPENDENT_AMBULATORY_CARE_PROVIDER_SITE_OTHER): Payer: Medicare Other | Admitting: Pharmacist Clinician (PhC)/ Clinical Pharmacy Specialist

## 2017-08-03 DIAGNOSIS — I4891 Unspecified atrial fibrillation: Secondary | ICD-10-CM | POA: Diagnosis not present

## 2017-08-03 DIAGNOSIS — I480 Paroxysmal atrial fibrillation: Secondary | ICD-10-CM

## 2017-08-03 DIAGNOSIS — Z7901 Long term (current) use of anticoagulants: Secondary | ICD-10-CM | POA: Diagnosis not present

## 2017-08-03 LAB — POCT INR: INR: 3.2

## 2017-08-04 ENCOUNTER — Ambulatory Visit: Payer: Self-pay | Admitting: Cardiology

## 2017-08-05 ENCOUNTER — Other Ambulatory Visit (HOSPITAL_COMMUNITY): Payer: Self-pay | Admitting: Psychiatry

## 2017-08-06 ENCOUNTER — Ambulatory Visit (INDEPENDENT_AMBULATORY_CARE_PROVIDER_SITE_OTHER): Payer: Medicare Other | Admitting: Cardiovascular Disease

## 2017-08-06 ENCOUNTER — Encounter: Payer: Self-pay | Admitting: Cardiovascular Disease

## 2017-08-06 VITALS — BP 119/73 | HR 54 | Ht 63.0 in | Wt 185.8 lb

## 2017-08-06 DIAGNOSIS — I10 Essential (primary) hypertension: Secondary | ICD-10-CM

## 2017-08-06 DIAGNOSIS — I739 Peripheral vascular disease, unspecified: Secondary | ICD-10-CM | POA: Insufficient documentation

## 2017-08-06 NOTE — Progress Notes (Signed)
08/06/2017 Laurie Horton   May 20, 1946  [id number]  Primary Physician Wardell Honour, MD Primary Cardiologist: Lorretta Harp MD Lupe Carney, [place]  HPI:  Laurie Horton is a 71 y.o. female divorced Caucasian female mother of 80, grandmother of 6 grandchildren referred to me by Dr. Martinique for peripheral vascular evaluation because of limiting claudication. She does have a history of CAD status post right left heart cath performed 03/04/17 revealing severe 2 vessel obstructive CAD with occluded RCA and circumflex marginal disease. She did have Takatsubo . cardiomyopathy as well as mild pulmonary hypertension. She has a history of hypertension and hyperlipidemia. She also has fibromyalgia. She complains of left greater than right lower extremity claudication which is lifestyle limiting at less than a block. Lower extremity arterial Dopplers performed 07/28/17 revealed a right ABI 0.9 and a left upper 82. She did not appear to have significant obstructive disease although distal aorta was not well visualized.   Current Meds  Medication Sig  . carvedilol (COREG) 12.5 MG tablet Take 1 tablet (12.5 mg total) by mouth 2 (two) times daily.  . diazepam (VALIUM) 2 MG tablet Take 2 mg by mouth 2 (two) times daily as needed for anxiety.   Marland Kitchen escitalopram (LEXAPRO) 10 MG tablet Take 1 tablet (10 mg total) by mouth daily.  . furosemide (LASIX) 40 MG tablet Take 1 tablet (40 mg total) by mouth daily.  Marland Kitchen gabapentin (NEURONTIN) 100 MG capsule Take 100-200 mg by mouth 3 (three) times daily. 100 mg in am, 100 mg at noon and  200 mg at bedtime  . HYDROcodone-acetaminophen (NORCO/VICODIN) 5-325 MG tablet Take 1 tablet by mouth 3 (three) times daily.   Marland Kitchen ipratropium (ATROVENT) 0.02 % nebulizer solution USE 1 VIAL BY NEBULIZATION 4 (FOUR) TIMES DAILY.  Marland Kitchen losartan (COZAAR) 25 MG tablet Take 1 tablet (25 mg total) by mouth daily.  . montelukast (SINGULAIR) 10 MG tablet TAKE 1 TABLET BY MOUTH AT BEDTIME  .  ondansetron (ZOFRAN ODT) 8 MG disintegrating tablet Take 1 tablet (8 mg total) by mouth every 8 (eight) hours as needed for nausea or vomiting.  Marland Kitchen oxybutynin (DITROPAN-XL) 10 MG 24 hr tablet Take 10 mg by mouth daily.   . pantoprazole (PROTONIX) 40 MG tablet Take 1 tablet by mouth every morning.   . polyethylene glycol (MIRALAX / GLYCOLAX) packet Take by mouth daily as needed for mild constipation. TAKES TEASPOONFUL IN 8 OZ OF WATER  . rosuvastatin (CRESTOR) 5 MG tablet Take 5 mg by mouth once a week. Sunday  . sodium chloride (OCEAN) 0.65 % SOLN nasal spray Place 1 spray into the nose 3 (three) times daily as needed for congestion. Only applies in the clogged nostril  . spironolactone (ALDACTONE) 25 MG tablet Take 1 tablet (25 mg total) by mouth daily.  Marland Kitchen tiZANidine (ZANAFLEX) 2 MG tablet Take 1-2 mg by mouth 3 (three) times daily as needed for muscle spasms. Takes a 1/2-1 tablet depending on pain level  . traZODone (DESYREL) 100 MG tablet Take 1 tablet (100 mg total) by mouth at bedtime.  . triamcinolone ointment (KENALOG) 0.1 % Apply 1 application topically 3 (three) times daily as needed (EAR IRRITATION).   Marland Kitchen warfarin (COUMADIN) 5 MG tablet TAKE 1/2 TO 1 TABLET BY MOUTH EVERY DAY OR AS DIRECTED BY COUMADIN CLINIC (FILLABLE 09/02/16)     Allergies  Allergen Reactions  . Alprazolam Other (See Comments)    REACTION: stops breathing  . Bee Venom Anaphylaxis  .  Iodine Anaphylaxis and Swelling    REACTION: swelling in throat  . Pseudoephedrine Hcl Er Shortness Of Breath  . Budesonide-Formoterol Fumarate Other (See Comments)    Blisters inside of mouth all over  . Crestor [Rosuvastatin Calcium] Other (See Comments)    Unable to walk  . Esomeprazole Magnesium Other (See Comments)    REACTION: "bouncing off walls"  . Flonase [Fluticasone Propionate] Other (See Comments)    NOSE BLEED  . Loratadine Other (See Comments)    claritin D causes shaking  . Lotrimin [Clotrimazole] Other (See  Comments)    blisters  . Lunesta [Eszopiclone] Other (See Comments)    REACTION: "slept for a week"  . Oxcarbazepine Other (See Comments)    Causes deep sleep and dizziness  . Statins Other (See Comments)    Can't walk, legs won't work   . Zolpidem Tartrate Other (See Comments)    REACTION: "slept for a week"  . Clarithromycin Other (See Comments)    All "mycins" Puts into "a" fib Will take if has to for severe sinus infection  . Aciphex [Rabeprazole Sodium] Rash  . Avelox [Moxifloxacin Hcl In Nacl] Other (See Comments)    Stomach cramps.   Darlin Coco [Valdecoxib] Rash  . Ceclor [Cefaclor] Rash  . Cephalexin Rash    Pt states that she is possibly allergic to this - had a reaction to Cefaclor in the past and she does not want to these class drugs. Added per patient request.  . Covera-Hs [Verapamil Hcl] Palpitations  . Dicyclomine Hcl Rash  . Other Other (See Comments)    Glue from ekg/heart monitor leads --rash +  Any MYCINS  . Tessalon Perles Rash    Social History   Social History  . Marital status: Divorced    Spouse name: N/A  . Number of children: 2  . Years of education: N/A   Occupational History  . disability    Social History Main Topics  . Smoking status: Former Smoker    Quit date: 06/02/2010  . Smokeless tobacco: Never Used  . Alcohol use No  . Drug use: No  . Sexual activity: Yes    Partners: Male    Birth control/ protection: None   Other Topics Concern  . Not on file   Social History Narrative   Marital status: divorced in 1978 after ten years; dating casually      Children: 3 biological children; 3 court appointed children; 6 grandchildren; 5 gg      Lives: alone; children in [place] and [place].      Employment: retired age 71; disability for COPD, CVA at age 7.      Tobacco: former smoker; quit smoking 2011.      Alcohol: none      Exercise: walks dog four times per day; goes to pool three times per week.       ADLs: drives; no assistant  devices; does have a walker.  Cleaning is limited in 2018.  Daughter helps with cleaning.  Does own grocery shopping.      Advanced Directives: YES; DNR/DNI; HCPOA:            Review of Systems: General: negative for chills, fever, night sweats or weight changes.  Cardiovascular: negative for chest pain, dyspnea on exertion, edema, orthopnea, palpitations, paroxysmal nocturnal dyspnea or shortness of breath Dermatological: negative for rash Respiratory: negative for cough or wheezing Urologic: negative for hematuria Abdominal: negative for nausea, vomiting, diarrhea, bright red blood per rectum, melena,  or hematemesis Neurologic: negative for visual changes, syncope, or dizziness All other systems reviewed and are otherwise negative except as noted above.    Blood pressure 119/73, pulse (!) 54, height 5\' 3"  (1.6 m), weight 185 lb 12.8 oz (84.3 kg).  General appearance: alert and no distress Neck: no adenopathy, no carotid bruit, no JVD, supple, symmetrical, trachea midline and thyroid not enlarged, symmetric, no tenderness/mass/nodules Lungs: clear to auscultation bilaterally Heart: regular rate and rhythm, S1, S2 normal, no murmur, click, rub or gallop Extremities: extremities normal, atraumatic, no cyanosis or edema Pulses: 2+ and symmetric Skin: Skin color, texture, turgor normal. No rashes or lesions Neurologic: Alert and oriented X 3, normal strength and tone. Normal symmetric reflexes. Normal coordination and gait  EKG sinus rhythm at 64 with anterior T wave T-wave inversion. I personally reviewed this EKG.  ASSESSMENT AND PLAN:   Claudication in peripheral vascular disease [facility]) Ms. Graff was referred to me by Dr. Martinique for evaluation of lifestyle limiting claudication. She has 71 years old with a history of continued tobacco abuse, hypertension and hyperlipidemia. She does have fibromyalgia and has undergone a left total knee replacement. She complains of left greater than  right lower extremity claudication at 20 feet. She had lower extremity Dopplers performed 07/28/17 revealing a right ABI 0.9 and left the 0.82. There did not appear to be any significant obstructive disease although the distal abdominal aorta was not well visualized. I'm going to get a CTA to further evaluate this segment is normal I suspect that her symptoms aren't not vascular in nature.      Lorretta Harp MD FACP,FACC,FAHA, [facility] 08/06/2017 10:23 AM

## 2017-08-06 NOTE — Assessment & Plan Note (Signed)
Ms. Croy was referred to me by Dr. Martinique for evaluation of lifestyle limiting claudication. She has 71 years old with a history of continued tobacco abuse, hypertension and hyperlipidemia. She does have fibromyalgia and has undergone a left total knee replacement. She complains of left greater than right lower extremity claudication at 20 feet. She had lower extremity Dopplers performed 07/28/17 revealing a right ABI 0.9 and left the 0.82. There did not appear to be any significant obstructive disease although the distal abdominal aorta was not well visualized. I'm going to get a CTA to further evaluate this segment is normal I suspect that her symptoms aren't not vascular in nature.

## 2017-08-06 NOTE — Patient Instructions (Signed)
Medication Instructions: Your physician recommends that you continue on your current medications as directed. Please refer to the Current Medication list given to you today.  Labwork: Your physician recommends that you return for lab work prior to CTA to check kidney function.   Testing/Procedures: CT Angiography (CTA) Abdomen/Pelvis--looking at aorta and iliac arteries with 1 mm cuts, is a special type of CT scan that uses a computer to produce multi-dimensional views of major blood vessels throughout the body. In CT angiography, a contrast material is injected through an IV to help visualize the blood vessels   Follow-Up: Your physician recommends that you schedule a follow-up appointment as needed with Dr. Gwenlyn Found.  If you need a refill on your cardiac medications before your next appointment, please call your pharmacy.

## 2017-08-06 NOTE — Addendum Note (Signed)
Addended by: Therisa Doyne on: 08/06/2017 11:31 AM   Modules accepted: Orders

## 2017-08-10 ENCOUNTER — Telehealth (HOSPITAL_COMMUNITY): Payer: Self-pay

## 2017-08-10 NOTE — Telephone Encounter (Signed)
Patient called me about her medication - she misunderstood how to take her medications. I explained to patient how to take it again and had her write it down. She was having some side effects, facial swelling, not hungry, and visual hallucinations. I told her to take the medication the way that I told her and to call me Monday and let me know how she is feeling. Patient said she would and she also told me that she is not suicidal, she has a neighbor who is a retired Dance movement psychotherapist in on her.

## 2017-08-17 ENCOUNTER — Telehealth: Payer: Self-pay | Admitting: Cardiovascular Disease

## 2017-08-17 ENCOUNTER — Ambulatory Visit (INDEPENDENT_AMBULATORY_CARE_PROVIDER_SITE_OTHER): Payer: Medicare Other | Admitting: Pharmacist

## 2017-08-17 DIAGNOSIS — I480 Paroxysmal atrial fibrillation: Secondary | ICD-10-CM | POA: Diagnosis not present

## 2017-08-17 DIAGNOSIS — Z7901 Long term (current) use of anticoagulants: Secondary | ICD-10-CM

## 2017-08-17 LAB — POCT INR: INR: 2.4

## 2017-08-17 NOTE — Telephone Encounter (Signed)
Left message to call back  

## 2017-08-17 NOTE — Telephone Encounter (Signed)
Please Call,concerning pt,[place].

## 2017-08-19 ENCOUNTER — Other Ambulatory Visit (HOSPITAL_COMMUNITY): Payer: Self-pay | Admitting: Psychiatry

## 2017-08-23 ENCOUNTER — Other Ambulatory Visit (HOSPITAL_COMMUNITY): Payer: Self-pay | Admitting: Psychiatry

## 2017-08-23 ENCOUNTER — Other Ambulatory Visit: Payer: Self-pay | Admitting: *Deleted

## 2017-08-23 ENCOUNTER — Ambulatory Visit (HOSPITAL_COMMUNITY): Payer: Medicare Other

## 2017-08-23 DIAGNOSIS — I6523 Occlusion and stenosis of bilateral carotid arteries: Secondary | ICD-10-CM

## 2017-08-23 DIAGNOSIS — F331 Major depressive disorder, recurrent, moderate: Secondary | ICD-10-CM

## 2017-08-23 LAB — BASIC METABOLIC PANEL
BUN/Creatinine Ratio: 17 (ref 12–28)
BUN: 19 mg/dL (ref 8–27)
CALCIUM: 9.9 mg/dL (ref 8.7–10.3)
CO2: 25 mmol/L (ref 20–29)
CREATININE: 1.1 mg/dL — AB (ref 0.57–1.00)
Chloride: 94 mmol/L — ABNORMAL LOW (ref 96–106)
GFR calc Af Amer: 58 mL/min/{1.73_m2} — ABNORMAL LOW (ref >59)
GFR calc non Af Amer: 51 mL/min/{1.73_m2} — ABNORMAL LOW (ref >59)
GLUCOSE: 99 mg/dL (ref 65–99)
Potassium: 4.8 mmol/L (ref 3.5–5.2)
Sodium: 137 mmol/L (ref 134–144)

## 2017-08-25 ENCOUNTER — Encounter (HOSPITAL_COMMUNITY): Payer: Self-pay | Admitting: Psychiatry

## 2017-08-25 ENCOUNTER — Ambulatory Visit (INDEPENDENT_AMBULATORY_CARE_PROVIDER_SITE_OTHER): Payer: Medicare Other | Admitting: Psychiatry

## 2017-08-25 VITALS — BP 120/61 | HR 61 | Ht 63.5 in | Wt 189.6 lb

## 2017-08-25 DIAGNOSIS — Z79899 Other long term (current) drug therapy: Secondary | ICD-10-CM | POA: Diagnosis not present

## 2017-08-25 DIAGNOSIS — Z87891 Personal history of nicotine dependence: Secondary | ICD-10-CM

## 2017-08-25 DIAGNOSIS — Z818 Family history of other mental and behavioral disorders: Secondary | ICD-10-CM | POA: Diagnosis not present

## 2017-08-25 DIAGNOSIS — F0630 Mood disorder due to known physiological condition, unspecified: Secondary | ICD-10-CM

## 2017-08-25 DIAGNOSIS — F333 Major depressive disorder, recurrent, severe with psychotic symptoms: Secondary | ICD-10-CM

## 2017-08-25 MED ORDER — DESVENLAFAXINE SUCCINATE ER 50 MG PO TB24: 50.0000 mg | ORAL_TABLET | Freq: Every day | ORAL | 2 refills | Status: DC

## 2017-08-25 MED ORDER — TRAZODONE HCL 100 MG PO TABS: 100.0000 mg | ORAL_TABLET | Freq: Every day | ORAL | 0 refills | Status: DC

## 2017-08-25 NOTE — Progress Notes (Signed)
BH MD/PA/NP OP Progress Note  08/25/2017 8:26 AM [place]ie H[place]  MRN:  [id number]  Chief Complaint: I have a reaction to the new medication.  I have a dizziness and hallucination.  I like to go back on Pristiq.  HPI: [place] came earlier than her scheduled appointment.  On her last visit we recommended to try Lexapro but after taking for few days she started to have hallucination, dizziness and she was not comfortable taking it.  We recommended to stop but she continued until she wanted to see me. She sleeping on and off.  She admitted seeing cats in her dream and sometime having visual hallucination.  She is concerned about her physical health because she can use to have shortness of breath and she is easily Tired.  Patient has lot of family issues.  She is upset on her daughter who lives in [place] and having issues with her husband.  Patient also taking multiple pain medication.  Recently she's seen her pain specialist and scheduled to have pain injection in few days.  Patient denies any suicidal thoughts or homicidal thoughts but admitted some time anxious and nervous and feels sometimes hopeless.  She like to go back on Pristiq.  She lives with her cats and dogs.  She denies drinking alcohol or using any illegal substances.  She is seeing cardiologist for her heart care, Dr. Zannie Cove for chronic pain.  Visit Diagnosis:    ICD-10-CM   1. 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 Patient denies any previous history of psychiatric inpatient treatment, suicidal attempt, paranoia, hallucination or any mania. She started seeing psychiatrist in 2006 after she had a stroke. She had tried Cymbalta and Celexa in the pastWith limited response.  She tried Ambien, Lunesta and Xanax but developed allergies and side effects.  We tried increasing Pristiq but she couldn't handle the  side effects  Past Medical History:  Past Medical History:  Diagnosis Date  . CHF (congestive heart failure) ([place]) 03/02/2017   EF 25-30% 2018  . Complication of anesthesia    various issues with oxygen  saturations post op  . COPD (chronic obstructive pulmonary disease) ([place])   . Depression   . Diverticulitis   . DVT (deep venous thrombosis) ([place])   . Fibromyalgia   . GERD (gastroesophageal reflux disease)   . Hiatal hernia   . History of deviated nasal septum    left- side  . HTN (hypertension)   . Hyperlipidemia   . Obesity   . On supplemental oxygen therapy    concentrator at night @ 1.5 l/m or when sleeps. O2 Sat niormally 87.  . Osteoporosis   . PAT (paroxysmal atrial tachycardia) ([place])   . PFO (patent foramen ovale)   . PULMONARY NODULE, LEFT LOWER LOBE 10/14/2009   1mm LLL nodule dec 2010. Stable and 97mm in Oct 2012. No further fu  . Right middle lobe pneumonia ([place]) 07/24/2011   First noted at admit 07/10/11. Persists on cxr 07/22/11. Cleared on CT 08/24/11. No further followup  . Stroke ([place])   . TOBACCO ABUSE 06/04/2009    Past Surgical History:  Procedure Laterality Date  . CARDIOVASCULAR STRESS TEST  12/26/2004   EF 74%. NO EVIDENCE OF ISCHEMIA  . ESOPHAGOGASTRODUODENOSCOPY (EGD) WITH PROPOFOL N/A 04/15/2015   Procedure: ESOPHAGOGASTRODUODENOSCOPY (EGD) WITH PROPOFOL;  Surgeon: Laurence Spates, MD;  Location: [facility];  Service: Endoscopy;  Laterality: N/A;  . KNEE ARTHROSCOPY  2000   left  . LAPAROSCOPIC CHOLECYSTECTOMY  04-16-2010   [place]  . MOUTH SURGERY     03-26-15 multiple extractions stitches remains  . RIGHT/LEFT HEART CATH AND CORONARY ANGIOGRAPHY N/A 03/04/2017   Procedure: Right/Left Heart Cath and Coronary Angiography;  Surgeon: Peter M Martinique, MD;  Location: [facility];  Service: Cardiovascular;  Laterality: N/A;  . TOTAL ABDOMINAL HYSTERECTOMY     post op needed oxygen was told "she gave them a scare"  . TUBAL LIGATION    . US  ECHOCARDIOGRAPHY  11/20/2009   EF 55-60%    Family Psychiatric History: reviewed.  Family History:  Family History  Problem Relation Age of Onset  . Dementia Mother   . Diabetes Mother   . Alzheimer's disease Mother   . Heart attack Brother 40  . Heart attack Father   . Schizophrenia Sister   . Diabetes Sister   . Tremor Sister     Social History:  Social History   Social History  . Marital status: Divorced    Spouse name: N/A  . Number of children: 2  . Years of education: N/A   Occupational History  . disability    Social History Main Topics  . Smoking status: Former Smoker    Quit date: 06/02/2010  . Smokeless tobacco: Never Used  . Alcohol use No  . Drug use: No  . Sexual activity: Yes    Partners: Male    Birth control/ protection: None   Other Topics Concern  . None   Social History Narrative   Marital status: divorced in 1978 after ten years; dating casually      Children: 3 biological children; 3 court appointed children; 6 grandchildren; 5 gg      Lives: alone; children in [place] and [place].      Employment: retired age 41; disability for COPD, CVA at age 57.      Tobacco: former smoker; quit smoking 2011.      Alcohol: none      Exercise: walks dog four times per day; goes to pool three times per week.       ADLs: drives; no assistant devices; does have a walker.  Cleaning is limited in 2018.  Daughter helps with cleaning.  Does own grocery shopping.      Advanced Directives: YES; DNR/DNI; HCPOA:           Allergies:  Allergies  Allergen Reactions  . Alprazolam Other (See Comments)    REACTION: stops breathing  . Bee Venom Anaphylaxis  . Iodine Anaphylaxis and Swelling    REACTION: swelling in throat  . Pseudoephedrine Hcl Er Shortness Of Breath  . Budesonide-Formoterol Fumarate Other (See Comments)    Blisters inside of mouth all over  . Crestor [Rosuvastatin Calcium] Other (See Comments)    Unable to walk  . Esomeprazole Magnesium  Other (See Comments)    REACTION: "bouncing off walls"  . Flonase [Fluticasone Propionate] Other (See Comments)    NOSE BLEED  . Loratadine Other (See Comments)    claritin D causes shaking  . Lotrimin [Clotrimazole] Other (See Comments)    blisters  . Lunesta [Eszopiclone] Other (See Comments)    REACTION: "slept for a week"  . Oxcarbazepine Other (See Comments)    Causes deep sleep and dizziness  . Statins Other (See Comments)    Can't walk, legs won't work   . Zolpidem Tartrate Other (See  Comments)    REACTION: "slept for a week"  . Clarithromycin Other (See Comments)    All "mycins" Puts into "a" fib Will take if has to for severe sinus infection  . Aciphex [Rabeprazole Sodium] Rash  . Avelox [Moxifloxacin Hcl In Nacl] Other (See Comments)    Stomach cramps.   Darlin Coco [Valdecoxib] Rash  . Ceclor [Cefaclor] Rash  . Cephalexin Rash    Pt states that she is possibly allergic to this - had a reaction to Cefaclor in the past and she does not want to these class drugs. Added per patient request.  . Covera-Hs [Verapamil Hcl] Palpitations  . Dicyclomine Hcl Rash  . Other Other (See Comments)    Glue from ekg/heart monitor leads --rash +  Any MYCINS  . Tessalon Perles Rash    Metabolic Disorder Labs: Lab Results  Component Value Date   HGBA1C 6.0 02/06/2016   MPG 120 (H) 05/04/2013   No results found for: PROLACTIN Lab Results  Component Value Date   CHOL 207 (H) 12/09/2016   TRIG 221 (H) 12/09/2016   HDL 35 (L) 12/09/2016   CHOLHDL 5.9 (H) 12/09/2016   VLDL 25 07/02/2016   LDLCALC 128 (H) 12/09/2016   LDLCALC 155 (H) 07/02/2016   Lab Results  Component Value Date   TSH 1.960 08/16/2015   TSH 2.82 10/15/2014    Therapeutic Level Labs: No results found for: LITHIUM No results found for: VALPROATE No components found for:  CBMZ  Current Medications: Current Outpatient Prescriptions  Medication Sig Dispense Refill  . carvedilol (COREG) 12.5 MG tablet Take 1  tablet (12.5 mg total) by mouth 2 (two) times daily. 60 tablet 11  . diazepam (VALIUM) 2 MG tablet Take 2 mg by mouth 2 (two) times daily as needed for anxiety.     Marland Kitchen escitalopram (LEXAPRO) 10 MG tablet Take 1 tablet (10 mg total) by mouth daily. 30 tablet 0  . furosemide (LASIX) 40 MG tablet Take 1 tablet (40 mg total) by mouth daily. 30 tablet 11  . gabapentin (NEURONTIN) 100 MG capsule Take 100-200 mg by mouth 3 (three) times daily. 100 mg in am, 100 mg at noon and  200 mg at bedtime    . HYDROcodone-acetaminophen (NORCO/VICODIN) 5-325 MG tablet Take 1 tablet by mouth 3 (three) times daily.     Marland Kitchen ipratropium (ATROVENT) 0.02 % nebulizer solution USE 1 VIAL BY NEBULIZATION 4 (FOUR) TIMES DAILY. 75 mL 3  . losartan (COZAAR) 25 MG tablet Take 1 tablet (25 mg total) by mouth daily. 30 tablet 11  . montelukast (SINGULAIR) 10 MG tablet TAKE 1 TABLET BY MOUTH AT BEDTIME 90 tablet 0  . ondansetron (ZOFRAN ODT) 8 MG disintegrating tablet Take 1 tablet (8 mg total) by mouth every 8 (eight) hours as needed for nausea or vomiting. 20 tablet 0  . oxybutynin (DITROPAN-XL) 10 MG 24 hr tablet Take 10 mg by mouth daily.     . pantoprazole (PROTONIX) 40 MG tablet Take 1 tablet by mouth every morning.     . polyethylene glycol (MIRALAX / GLYCOLAX) packet Take by mouth daily as needed for mild constipation. TAKES TEASPOONFUL IN 8 OZ OF WATER    . rosuvastatin (CRESTOR) 5 MG tablet Take 5 mg by mouth once a week. Sunday  3  . sodium chloride (OCEAN) 0.65 % SOLN nasal spray Place 1 spray into the nose 3 (three) times daily as needed for congestion. Only applies in the clogged nostril    .  spironolactone (ALDACTONE) 25 MG tablet Take 1 tablet (25 mg total) by mouth daily. 30 tablet 11  . tiZANidine (ZANAFLEX) 2 MG tablet Take 1-2 mg by mouth 3 (three) times daily as needed for muscle spasms. Takes a 1/2-1 tablet depending on pain level    . traZODone (DESYREL) 100 MG tablet Take 1 tablet (100 mg total) by mouth at  bedtime. 90 tablet 0  . triamcinolone ointment (KENALOG) 0.1 % Apply 1 application topically 3 (three) times daily as needed (EAR IRRITATION).     Marland Kitchen warfarin (COUMADIN) 5 MG tablet TAKE 1/2 TO 1 TABLET BY MOUTH EVERY DAY OR AS DIRECTED BY COUMADIN CLINIC (FILLABLE 09/02/16) 30 tablet 1   No current facility-administered medications for this visit.      Musculoskeletal: Strength & Muscle Tone: within normal limits Gait & Station: normal Patient leans: N/A  Psychiatric Specialty Exam: Review of Systems  Constitutional: Positive for malaise/fatigue.  Neurological: Positive for dizziness.  Psychiatric/Behavioral: Positive for hallucinations.    Blood pressure 120/61, pulse 61, height 5' 3.5" (1.613 m), weight 189 lb 9.6 oz (86 kg).Body mass index is 33.06 kg/m.  General Appearance: Casual  Eye Contact:  Good  Speech:  Clear and Coherent  Volume:  Normal  Mood:  Anxious  Affect:  Appropriate  Thought Process:  Goal Directed  Orientation:  Full (Time, Place, and Person)  Thought Content: Hallucinations: Visual seeing cats and Rumination   Suicidal Thoughts:  No  Homicidal Thoughts:  No  Memory:  Immediate;   Fair Recent;   Good Remote;   Good  Judgement:  Good  Insight:  Good  Psychomotor Activity:  Decreased  Concentration:  Concentration: Fair and Attention Span: Fair  Recall:  [employer] of Knowledge: Good  Language: Good  Akathisia:  No  Handed:  Right  AIMS (if indicated): not done  Assets:  Communication Skills Desire for Improvement Housing  ADL's:  Intact  Cognition: WNL  Sleep:  Fair   Screenings: PHQ2-9     Office Visit from 04/28/2017 in Primary Care at [facility] from 02/08/2017 in [facility] at [facility] from 12/01/2016 in [facility] at [facility] from 09/12/2016 in Primary Care at [facility] from 09/01/2016 in Primary Care at [facility] Total Score  0  0  0  0  0       Assessment and Plan: major  depressive disorder, recurrent.  Recommended to discontinue Lexapro since it is causing dizziness and sometime hallucination.  She will resume Pristiq 50 mg daily.  Patient is not interested in counseling.  She is taking multiple medication and discussed polypharmacy and risk and benefits explained.  Encouraged to keep appointment with her pain specialist in cardiology.  Recommended to call us back if she has any question or any concern.  Follow-up in 3 months.   Xaria Judon T., MD 08/25/2017, 8:26 AM

## 2017-08-26 ENCOUNTER — Other Ambulatory Visit: Payer: Self-pay | Admitting: Cardiovascular Disease

## 2017-08-26 ENCOUNTER — Ambulatory Visit (HOSPITAL_COMMUNITY)
Admission: RE | Admit: 2017-08-26 | Discharge: 2017-08-26 | Disposition: A | Discharge status: Home / Self Care | Payer: Medicare Other | Source: Ambulatory Visit | Attending: Cardiovascular Disease | Admitting: Cardiovascular Disease

## 2017-08-26 DIAGNOSIS — I70208 Unspecified atherosclerosis of native arteries of extremities, other extremity: Secondary | ICD-10-CM | POA: Diagnosis not present

## 2017-08-26 DIAGNOSIS — I739 Peripheral vascular disease, unspecified: Secondary | ICD-10-CM

## 2017-08-26 DIAGNOSIS — Z90710 Acquired absence of both cervix and uterus: Secondary | ICD-10-CM | POA: Diagnosis not present

## 2017-08-26 DIAGNOSIS — K5730 Diverticulosis of large intestine without perforation or abscess without bleeding: Secondary | ICD-10-CM | POA: Diagnosis not present

## 2017-08-26 DIAGNOSIS — I350 Nonrheumatic aortic (valve) stenosis: Secondary | ICD-10-CM | POA: Insufficient documentation

## 2017-08-26 MED ORDER — GADOBENATE DIMEGLUMINE 529 MG/ML IV SOLN
18.0000 mL | Freq: Once | INTRAVENOUS | Status: AC | PRN
  Administered 2017-08-26: 18 mL via INTRAVENOUS

## 2017-08-30 ENCOUNTER — Other Ambulatory Visit: Payer: Self-pay | Admitting: Family Medicine

## 2017-08-30 NOTE — Telephone Encounter (Signed)
Refill request montelukast 10 mg # 30 with 1 refill approved.

## 2017-08-30 NOTE — Telephone Encounter (Signed)
Left message for patient to call and schedule appt

## 2017-08-31 ENCOUNTER — Ambulatory Visit (INDEPENDENT_AMBULATORY_CARE_PROVIDER_SITE_OTHER): Payer: Medicare Other | Admitting: Cardiovascular Disease

## 2017-08-31 ENCOUNTER — Encounter: Payer: Self-pay | Admitting: Cardiovascular Disease

## 2017-08-31 ENCOUNTER — Other Ambulatory Visit (HOSPITAL_COMMUNITY): Payer: Self-pay | Admitting: Psychiatry

## 2017-08-31 VITALS — BP 135/85 | HR 97 | Ht 63.5 in | Wt 190.6 lb

## 2017-08-31 DIAGNOSIS — I739 Peripheral vascular disease, unspecified: Secondary | ICD-10-CM

## 2017-08-31 LAB — CBC WITH DIFFERENTIAL/PLATELET
BASOS: 0 %
Basophils Absolute: 0 10*3/uL (ref 0.0–0.2)
EOS (ABSOLUTE): 0.1 10*3/uL (ref 0.0–0.4)
EOS: 2 %
HEMATOCRIT: 36.4 % (ref 34.0–46.6)
HEMOGLOBIN: 12.2 g/dL (ref 11.1–15.9)
IMMATURE GRANS (ABS): 0 10*3/uL (ref 0.0–0.1)
Immature Granulocytes: 0 %
Lymphocytes Absolute: 1.5 10*3/uL (ref 0.7–3.1)
Lymphs: 26 %
MCH: 29.8 pg (ref 26.6–33.0)
MCHC: 33.5 g/dL (ref 31.5–35.7)
MCV: 89 fL (ref 79–97)
MONOS ABS: 0.5 10*3/uL (ref 0.1–0.9)
Monocytes: 9 %
NEUTROS PCT: 63 %
Neutrophils Absolute: 3.6 10*3/uL (ref 1.4–7.0)
Platelets: 160 10*3/uL (ref 150–379)
RBC: 4.09 x10E6/uL (ref 3.77–5.28)
RDW: 14.6 % (ref 12.3–15.4)
WBC: 5.8 10*3/uL (ref 3.4–10.8)

## 2017-08-31 LAB — PROTIME-INR
INR: 2.2 — ABNORMAL HIGH (ref 0.8–1.2)
Prothrombin Time: 21.7 s — ABNORMAL HIGH (ref 9.1–12.0)

## 2017-08-31 LAB — BASIC METABOLIC PANEL
BUN / CREAT RATIO: 15 (ref 12–28)
BUN: 14 mg/dL (ref 8–27)
CO2: 23 mmol/L (ref 20–29)
CREATININE: 0.96 mg/dL (ref 0.57–1.00)
Calcium: 9.7 mg/dL (ref 8.7–10.3)
Chloride: 97 mmol/L (ref 96–106)
GFR calc Af Amer: 69 mL/min/{1.73_m2} (ref >59)
GFR, EST NON AFRICAN AMERICAN: 60 mL/min/{1.73_m2} (ref >59)
Glucose: 153 mg/dL — ABNORMAL HIGH (ref 65–99)
Potassium: 4.9 mmol/L (ref 3.5–5.2)
SODIUM: 136 mmol/L (ref 134–144)

## 2017-08-31 LAB — TSH: TSH: 1.99 u[IU]/mL (ref 0.450–4.500)

## 2017-08-31 LAB — APTT: aPTT: 35 s — ABNORMAL HIGH (ref 24–33)

## 2017-08-31 NOTE — Patient Instructions (Addendum)
   [facility] [address] [place] [address] Dept: [phone] Loc: [place]  08/31/2017  You are scheduled for a Peripheral Angiogram on Thursday, November 8 with Dr. [name].  1. Please arrive at the [facility] (Main Entrance A) at [facility]: [address] [place], [place] [address] at 5:30 AM (two hours before your procedure to ensure your preparation). Free valet parking service is available.   Special note: Every effort is made to have your procedure done on time. Please understand that emergencies sometimes delay scheduled procedures.  2. Diet: Do not eat or drink anything after midnight prior to your procedure except sips of water to take medications.  3. Labs: Please have your labs done in our office today.  4. Medication instructions in preparation for your procedure:  Stop taking Coumadin (Warfarin) on Sunday, November 4.  On the morning of your procedure, take any morning medicines NOT listed above.  You may use sips of water.  5. Plan for one night stay--bring personal belongings. 6. Bring a current list of your medications and current insurance cards. 7. You MUST have a responsible person to drive you home. 8. Someone MUST be with you the first 24 hours after you arrive home or your discharge will be delayed. 9. Please wear clothes that are easy to get on and off and wear slip-on shoes.  Thank you for allowing [place] to care for you!   -- [facility]  Post-procedure follow-up:  Your physician has requested that you have a aorta and iliac duplex. During this test, an ultrasound is used to evaluate blood flow to the aorta and iliac arteries. Allow one hour for this exam. Do not eat after midnight the day before and avoid carbonated beverages.   Your physician has requested that you have an ankle brachial  index (ABI). During this test an ultrasound and blood pressure cuff are used to evaluate the arteries that supply the arms and legs with blood. Allow thirty minutes for this exam. There are no restrictions or special instructions. [place] physician recommends that you schedule a follow-up appointment with Dr. Gwenlyn Found 2 weeks after procedure.

## 2017-08-31 NOTE — Telephone Encounter (Signed)
Medicine discontinued.

## 2017-08-31 NOTE — Assessment & Plan Note (Signed)
[place]  returns safe to follow-up her recent MRA done in evaluation of life limiting claudication. This revealed what appeared to be a hemodynamically significant infrarenal abdominal aortic stenosis just above the takeoff of the IMA. 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 She has significant lifestyle limiting claudication with Dopplers that show a right ABI 0.9 and a left ABI 0.82. Based on this, and the severity of her symptoms we have decided to proceed with abdominal aortography and potential endovascular therapy. Her oldest younger daughter were present during the office visit. I thoroughly explained the risks and agree to proceed.

## 2017-08-31 NOTE — Progress Notes (Signed)
[place]  returns safe to follow-up her recent MRA done in evaluation of life limiting claudication. This revealed what appeared to be a hemodynamically significant infrarenal abdominal aortic stenosis just above the takeoff of the IMA. 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 She has significant lifestyle limiting claudication with Dopplers that show a right ABI 0.9 and a left ABI 0.82. Based on this, and the severity of her symptoms we have decided to proceed with abdominal aortography and potential endovascular therapy. Her oldest younger daughter were present during the office visit. I thoroughly explained the risks and agree to proceed.  Lorretta Harp, M.D., [place], [facility], [place] [address] [place], [place]  [address]  [phone] 08/31/2017 11:26 AM

## 2017-09-01 ENCOUNTER — Telehealth: Payer: Self-pay | Admitting: Cardiovascular Disease

## 2017-09-01 NOTE — Telephone Encounter (Signed)
° °  [name] Pre-operative Risk Assessment    Request for surgical clearance:  1. What type of surgery is being performed? Simple Dental Extraction-Pt is coming in later this morning -that why this was sent to triage  When is this surgery scheduled? This morning (need asap) 2. Are there any medications that need to be held prior to surgery and how long?Does pt need to stop her Warafin and does she need to be pre medicated?  3. Practice name and name of physician performing surgery?Dr Annitta Jersey  5 office phone and fax number? [phone] and fax is [phone]   4. Anesthesia type (None, local, MAC, general) ?Local   [place] 09/01/2017, 9:35 AM  _________________________________________________________________   (provider comments below)  Done

## 2017-09-01 NOTE — Telephone Encounter (Signed)
Pt has had simple extraction, Dr. Martinique gave instructions.

## 2017-09-02 ENCOUNTER — Other Ambulatory Visit (HOSPITAL_COMMUNITY): Payer: Self-pay | Admitting: Psychiatry

## 2017-09-02 NOTE — Telephone Encounter (Signed)
This clearance was taken care of by Cecilie Kicks

## 2017-09-03 ENCOUNTER — Other Ambulatory Visit: Payer: Self-pay | Admitting: Cardiology

## 2017-09-07 ENCOUNTER — Other Ambulatory Visit: Payer: Self-pay | Admitting: Cardiovascular Disease

## 2017-09-07 DIAGNOSIS — I739 Peripheral vascular disease, unspecified: Secondary | ICD-10-CM

## 2017-09-09 ENCOUNTER — Other Ambulatory Visit: Payer: Self-pay | Admitting: Pharmacist Clinician (PhC)/ Clinical Pharmacy Specialist

## 2017-09-09 ENCOUNTER — Encounter (HOSPITAL_COMMUNITY): Payer: Self-pay | Admitting: Cardiovascular Disease

## 2017-09-09 ENCOUNTER — Ambulatory Visit (HOSPITAL_COMMUNITY)
Admission: RE | Admit: 2017-09-09 | Discharge: 2017-09-09 | Disposition: A | Discharge status: Home / Self Care | Payer: Medicare Other | Source: Ambulatory Visit | Attending: Cardiovascular Disease | Admitting: Cardiovascular Disease

## 2017-09-09 ENCOUNTER — Encounter (HOSPITAL_COMMUNITY)
Admission: RE | Disposition: A | Discharge status: Home / Self Care | Payer: Self-pay | Source: Ambulatory Visit | Attending: Cardiovascular Disease

## 2017-09-09 DIAGNOSIS — I2510 Atherosclerotic heart disease of native coronary artery without angina pectoris: Secondary | ICD-10-CM | POA: Insufficient documentation

## 2017-09-09 DIAGNOSIS — I272 Pulmonary hypertension, unspecified: Secondary | ICD-10-CM | POA: Diagnosis not present

## 2017-09-09 DIAGNOSIS — I739 Peripheral vascular disease, unspecified: Secondary | ICD-10-CM | POA: Diagnosis present

## 2017-09-09 DIAGNOSIS — Z7901 Long term (current) use of anticoagulants: Secondary | ICD-10-CM | POA: Diagnosis not present

## 2017-09-09 DIAGNOSIS — Z87891 Personal history of nicotine dependence: Secondary | ICD-10-CM | POA: Diagnosis not present

## 2017-09-09 DIAGNOSIS — I70213 Atherosclerosis of native arteries of extremities with intermittent claudication, bilateral legs: Secondary | ICD-10-CM | POA: Diagnosis not present

## 2017-09-09 DIAGNOSIS — E785 Hyperlipidemia, unspecified: Secondary | ICD-10-CM | POA: Insufficient documentation

## 2017-09-09 DIAGNOSIS — I10 Essential (primary) hypertension: Secondary | ICD-10-CM | POA: Insufficient documentation

## 2017-09-09 DIAGNOSIS — Z79899 Other long term (current) drug therapy: Secondary | ICD-10-CM | POA: Diagnosis not present

## 2017-09-09 DIAGNOSIS — M797 Fibromyalgia: Secondary | ICD-10-CM | POA: Diagnosis not present

## 2017-09-09 HISTORY — PX: LOWER EXTREMITY ANGIOGRAPHY: CATH118251

## 2017-09-09 LAB — PROTIME-INR
INR: 1.06
Prothrombin Time: 13.7 seconds (ref 11.4–15.2)

## 2017-09-09 SURGERY — LOWER EXTREMITY ANGIOGRAPHY
Anesthesia: LOCAL

## 2017-09-09 MED ORDER — IODIXANOL 320 MG/ML IV SOLN
INTRAVENOUS | Status: DC | PRN
Start: 2017-09-09 — End: 2017-09-09
  Administered 2017-09-09: 140 mL via INTRA_ARTERIAL

## 2017-09-09 MED ORDER — FENTANYL CITRATE (PF) 100 MCG/2ML IJ SOLN
INTRAMUSCULAR | Status: AC
  Filled 2017-09-09: qty 2

## 2017-09-09 MED ORDER — SODIUM CHLORIDE 0.9 % WEIGHT BASED INFUSION
3.0000 mL/kg/h | INTRAVENOUS | Status: AC
  Administered 2017-09-09: 3 mL/kg/h via INTRAVENOUS

## 2017-09-09 MED ORDER — METHYLPREDNISOLONE SODIUM SUCC 125 MG IJ SOLR
INTRAMUSCULAR | Status: AC
  Administered 2017-09-09: 125 mg via INTRAVENOUS
  Filled 2017-09-09: qty 2

## 2017-09-09 MED ORDER — ASPIRIN 81 MG PO CHEW
CHEWABLE_TABLET | ORAL | Status: AC
  Administered 2017-09-09: 81 mg via ORAL
  Filled 2017-09-09: qty 1

## 2017-09-09 MED ORDER — METHYLPREDNISOLONE SODIUM SUCC 125 MG IJ SOLR
125.0000 mg | Freq: Once | INTRAMUSCULAR | Status: AC
  Administered 2017-09-09: 125 mg via INTRAVENOUS

## 2017-09-09 MED ORDER — WARFARIN SODIUM 5 MG PO TABS: ORAL_TABLET | ORAL | 0 refills | Status: DC

## 2017-09-09 MED ORDER — DIPHENHYDRAMINE HCL 50 MG/ML IJ SOLN
25.0000 mg | Freq: Once | INTRAMUSCULAR | Status: AC
  Administered 2017-09-09: 25 mg via INTRAVENOUS

## 2017-09-09 MED ORDER — FAMOTIDINE IN NACL 20-0.9 MG/50ML-% IV SOLN
20.0000 mg | Freq: Once | INTRAVENOUS | Status: AC
  Administered 2017-09-09: 20 mg via INTRAVENOUS

## 2017-09-09 MED ORDER — HEPARIN (PORCINE) IN NACL 2-0.9 UNIT/ML-% IJ SOLN
INTRAMUSCULAR | Status: AC
  Filled 2017-09-09: qty 1000

## 2017-09-09 MED ORDER — MIDAZOLAM HCL 2 MG/2ML IJ SOLN
INTRAMUSCULAR | Status: DC | PRN
  Administered 2017-09-09: 1 mg via INTRAVENOUS

## 2017-09-09 MED ORDER — SODIUM CHLORIDE 0.9% FLUSH: 3.0000 mL | Freq: Two times a day (BID) | INTRAVENOUS | Status: DC

## 2017-09-09 MED ORDER — SODIUM CHLORIDE 0.9% FLUSH: 3.0000 mL | INTRAVENOUS | Status: DC | PRN

## 2017-09-09 MED ORDER — FAMOTIDINE IN NACL 20-0.9 MG/50ML-% IV SOLN
INTRAVENOUS | Status: AC
  Administered 2017-09-09: 20 mg via INTRAVENOUS
  Filled 2017-09-09: qty 50

## 2017-09-09 MED ORDER — LABETALOL HCL 5 MG/ML IV SOLN: 10.0000 mg | INTRAVENOUS | Status: DC | PRN

## 2017-09-09 MED ORDER — LIDOCAINE HCL (PF) 1 % IJ SOLN
INTRAMUSCULAR | Status: AC
  Filled 2017-09-09: qty 30

## 2017-09-09 MED ORDER — ONDANSETRON HCL 4 MG/2ML IJ SOLN: 4.0000 mg | Freq: Four times a day (QID) | INTRAMUSCULAR | Status: DC | PRN

## 2017-09-09 MED ORDER — HYDRALAZINE HCL 20 MG/ML IJ SOLN: 5.0000 mg | INTRAMUSCULAR | Status: DC | PRN

## 2017-09-09 MED ORDER — SODIUM CHLORIDE 0.9 % IV SOLN: 250.0000 mL | INTRAVENOUS | Status: DC | PRN

## 2017-09-09 MED ORDER — MIDAZOLAM HCL 2 MG/2ML IJ SOLN
INTRAMUSCULAR | Status: AC
  Filled 2017-09-09: qty 2

## 2017-09-09 MED ORDER — HEPARIN (PORCINE) IN NACL 2-0.9 UNIT/ML-% IJ SOLN
INTRAMUSCULAR | Status: AC | PRN
  Administered 2017-09-09: 1000 mL via INTRA_ARTERIAL

## 2017-09-09 MED ORDER — LIDOCAINE HCL (PF) 1 % IJ SOLN
INTRAMUSCULAR | Status: DC | PRN
  Administered 2017-09-09: 30 mL via INTRADERMAL

## 2017-09-09 MED ORDER — SODIUM CHLORIDE 0.9 % WEIGHT BASED INFUSION: 1.0000 mL/kg/h | INTRAVENOUS | Status: DC

## 2017-09-09 MED ORDER — FENTANYL CITRATE (PF) 100 MCG/2ML IJ SOLN
INTRAMUSCULAR | Status: DC | PRN
  Administered 2017-09-09: 25 ug via INTRAVENOUS

## 2017-09-09 MED ORDER — SODIUM CHLORIDE 0.9 % IV SOLN: INTRAVENOUS | Status: AC

## 2017-09-09 MED ORDER — DIPHENHYDRAMINE HCL 50 MG/ML IJ SOLN
INTRAMUSCULAR | Status: AC
  Administered 2017-09-09: 25 mg via INTRAVENOUS
  Filled 2017-09-09: qty 1

## 2017-09-09 MED ORDER — ACETAMINOPHEN 325 MG PO TABS: 650.0000 mg | ORAL_TABLET | ORAL | Status: DC | PRN

## 2017-09-09 MED ORDER — ASPIRIN 81 MG PO CHEW
81.0000 mg | CHEWABLE_TABLET | ORAL | Status: AC
  Administered 2017-09-09: 81 mg via ORAL

## 2017-09-09 SURGICAL SUPPLY — 11 items
CATH ANGIO 5F BER2 65CM (CATHETERS) ×2 IMPLANT
CATH ANGIO 5F PIGTAIL 65CM (CATHETERS) ×2 IMPLANT
KIT PV (KITS) ×2 IMPLANT
SHEATH PINNACLE 5F 10CM (SHEATH) ×2 IMPLANT
STOPCOCK MORSE 400PSI 3WAY (MISCELLANEOUS) ×2 IMPLANT
SYRINGE MEDRAD AVANTA MACH 7 (SYRINGE) ×2 IMPLANT
TRANSDUCER W/STOPCOCK (MISCELLANEOUS) ×2 IMPLANT
TRAY PV CATH (CUSTOM PROCEDURE TRAY) ×2 IMPLANT
TUBING CIL FLEX 10 FLL-RA (TUBING) ×2 IMPLANT
WIRE HITORQ VERSACORE ST 145CM (WIRE) ×2 IMPLANT
WIRE J 3MM .035X145CM (WIRE) ×2 IMPLANT

## 2017-09-09 NOTE — H&P (Addendum)
[] Hide copied text  [] Hover for details       09/09/17 SARAE NICHOLES   [birth year]  [id number]  Primary Physician Wardell Honour, MD Primary Cardiologist: Lorretta Harp MD Lupe Carney, [place]  HPI:  DAIYA TAMER is a 71 y.o. female divorced Caucasian female mother of 67, grandmother of 104 grandchildren referred to me by Dr. Martinique for peripheral vascular evaluation because of limiting claudication. She does have a history of CAD status post right left heart cath performed 03/04/17 revealing severe 2 vessel obstructive CAD with occluded RCA and circumflex marginal disease. She did have Takatsubo . cardiomyopathy as well as mild pulmonary hypertension. She has a history of hypertension and hyperlipidemia. She also has fibromyalgia. She complains of left greater than right lower extremity claudication which is lifestyle limiting at less than a block. Lower extremity arterial Dopplers performed 07/28/17 revealed a right ABI 0.9 and a left upper 82. She did not appear to have significant obstructive disease although distal aorta was not well visualized.   ActiveMedications      Current Meds  Medication Sig  . carvedilol (COREG) 12.5 MG tablet Take 1 tablet (12.5 mg total) by mouth 2 (two) times daily.  . diazepam (VALIUM) 2 MG tablet Take 2 mg by mouth 2 (two) times daily as needed for anxiety.   Marland Kitchen escitalopram (LEXAPRO) 10 MG tablet Take 1 tablet (10 mg total) by mouth daily.  . furosemide (LASIX) 40 MG tablet Take 1 tablet (40 mg total) by mouth daily.  Marland Kitchen gabapentin (NEURONTIN) 100 MG capsule Take 100-200 mg by mouth 3 (three) times daily. 100 mg in am, 100 mg at noon and  200 mg at bedtime  . HYDROcodone-acetaminophen (NORCO/VICODIN) 5-325 MG tablet Take 1 tablet by mouth 3 (three) times daily.   Marland Kitchen ipratropium (ATROVENT) 0.02 % nebulizer solution USE 1 VIAL BY NEBULIZATION 4 (FOUR) TIMES DAILY.  Marland Kitchen losartan (COZAAR) 25 MG tablet Take 1 tablet (25 mg total) by mouth  daily.  . montelukast (SINGULAIR) 10 MG tablet TAKE 1 TABLET BY MOUTH AT BEDTIME  . ondansetron (ZOFRAN ODT) 8 MG disintegrating tablet Take 1 tablet (8 mg total) by mouth every 8 (eight) hours as needed for nausea or vomiting.  Marland Kitchen oxybutynin (DITROPAN-XL) 10 MG 24 hr tablet Take 10 mg by mouth daily.   . pantoprazole (PROTONIX) 40 MG tablet Take 1 tablet by mouth every morning.   . polyethylene glycol (MIRALAX / GLYCOLAX) packet Take by mouth daily as needed for mild constipation. TAKES TEASPOONFUL IN 8 OZ OF WATER  . rosuvastatin (CRESTOR) 5 MG tablet Take 5 mg by mouth once a week. Sunday  . sodium chloride (OCEAN) 0.65 % SOLN nasal spray Place 1 spray into the nose 3 (three) times daily as needed for congestion. Only applies in the clogged nostril  . spironolactone (ALDACTONE) 25 MG tablet Take 1 tablet (25 mg total) by mouth daily.  Marland Kitchen tiZANidine (ZANAFLEX) 2 MG tablet Take 1-2 mg by mouth 3 (three) times daily as needed for muscle spasms. Takes a 1/2-1 tablet depending on pain level  . traZODone (DESYREL) 100 MG tablet Take 1 tablet (100 mg total) by mouth at bedtime.  . triamcinolone ointment (KENALOG) 0.1 % Apply 1 application topically 3 (three) times daily as needed (EAR IRRITATION).   Marland Kitchen warfarin (COUMADIN) 5 MG tablet TAKE 1/2 TO 1 TABLET BY MOUTH EVERY DAY OR AS DIRECTED BY COUMADIN CLINIC (FILLABLE 09/02/16)  Allergies  Allergen Reactions  . Alprazolam Other (See Comments)    REACTION: stops breathing  . Bee Venom Anaphylaxis  . Iodine Anaphylaxis and Swelling    REACTION: swelling in throat  . Pseudoephedrine Hcl Er Shortness Of Breath  . Budesonide-Formoterol Fumarate Other (See Comments)    Blisters inside of mouth all over  . Crestor [Rosuvastatin Calcium] Other (See Comments)    Unable to walk  . Esomeprazole Magnesium Other (See Comments)    REACTION: "bouncing off walls"  . Flonase [Fluticasone Propionate] Other (See Comments)    NOSE BLEED  .  Loratadine Other (See Comments)    claritin D causes shaking  . Lotrimin [Clotrimazole] Other (See Comments)    blisters  . Lunesta [Eszopiclone] Other (See Comments)    REACTION: "slept for a week"  . Oxcarbazepine Other (See Comments)    Causes deep sleep and dizziness  . Statins Other (See Comments)    Can't walk, legs won't work   . Zolpidem Tartrate Other (See Comments)    REACTION: "slept for a week"  . Clarithromycin Other (See Comments)    All "mycins" Puts into "a" fib Will take if has to for severe sinus infection  . Aciphex [Rabeprazole Sodium] Rash  . Avelox [Moxifloxacin Hcl In Nacl] Other (See Comments)    Stomach cramps.   Darlin Coco [Valdecoxib] Rash  . Ceclor [Cefaclor] Rash  . Cephalexin Rash    Pt states that she is possibly allergic to this - had a reaction to Cefaclor in the past and she does not want to these class drugs. Added per patient request.  . Covera-Hs [Verapamil Hcl] Palpitations  . Dicyclomine Hcl Rash  . Other Other (See Comments)    Glue from ekg/heart monitor leads --rash +  Any MYCINS  . Tessalon Perles Rash    Social History        Social History  . Marital status: Divorced    Spouse name: N/A  . Number of children: 2  . Years of education: N/A       Occupational History  . disability         Social History Main Topics  . Smoking status: Former Smoker    Quit date: 06/02/2010  . Smokeless tobacco: Never Used  . Alcohol use No  . Drug use: No  . Sexual activity: Yes    Partners: Male    Birth control/ protection: None       Other Topics Concern  . Not on file      Social History Narrative   Marital status: divorced in 1978 after ten years; dating casually      Children: 3 biological children; 3 court appointed children; 6 grandchildren; 5 gg      Lives: alone; children in [place] and [place].      Employment: retired age 51; disability for COPD, CVA at age 36.      Tobacco:  former smoker; quit smoking 2011.      Alcohol: none      Exercise: walks dog four times per day; goes to pool three times per week.       ADLs: drives; no assistant devices; does have a walker.  Cleaning is limited in 2018.  Daughter helps with cleaning.  Does own grocery shopping.      Advanced Directives: YES; DNR/DNI; HCPOA:            Review of Systems: General: negative for chills, fever, night sweats or weight changes.  Cardiovascular: negative for chest pain, dyspnea on exertion, edema, orthopnea, palpitations, paroxysmal nocturnal dyspnea or shortness of breath Dermatological: negative for rash Respiratory: negative for cough or wheezing Urologic: negative for hematuria Abdominal: negative for nausea, vomiting, diarrhea, bright red blood per rectum, melena, or hematemesis Neurologic: negative for visual changes, syncope, or dizziness All other systems reviewed and are otherwise negative except as noted above.    Blood pressure 119/73, pulse (!) 54, height 5\' 3"  (1.6 m), weight 185 lb 12.8 oz (84.3 kg).  General appearance: alert and no distress Neck: no  adenopathy, no carotid bruit, no JVD, supple, symmetrical, trachea midline and thyroid not enlarged, symmetric, no tenderness/mass/nodules Lungs: clear to auscultation bilaterally Heart: regular rate and rhythm, S1, S2 normal, no murmur, click, rub or gallop Extremities: extremities normal, atraumatic, no cyanosis or edema Pulses: 2+ and symmetric Skin: Skin color, texture, turgor normal. No rashes or lesions Neurologic: Alert and oriented X 3, normal strength and tone. Normal symmetric reflexes. Normal coordination and gait  EKG sinus rhythm at 64 with anterior T wave T-wave inversion. I personally reviewed this EKG.  ASSESSMENT AND PLAN:   Claudication in peripheral vascular disease [facility]) Ms. Lucien was referred to me by Dr. Martinique for evaluation of lifestyle limiting claudication. She has 71 years old with  a history of continued tobacco abuse, hypertension and hyperlipidemia. She does have fibromyalgia and has undergone a left total knee replacement. She complains of left greater than right lower extremity claudication at 20 feet. She had lower extremity Dopplers performed 07/28/17 revealing a right ABI 0.9 and left the 0.82. There did not appear to be any significant obstructive disease although the distal abdominal aorta was not well visualized.I obtained an MRA this suggested a physiologically significant lesion in the infrarenal abdominal aorta as well as iliac disease. Based on this we decided to proceed with outpatient abdominal aortography with bifemoral runoff and potential endovascular treatment for lifestyle limitingclaudication. The risks and benefits were completely explained to the patient and her family and they agree to proceed.    Lorretta Harp, M.D., [place], [facility], Laverta [place] [address] [place], [place]  [address]  [phone] 09/09/2017 7:31 AM

## 2017-09-09 NOTE — Discharge Instructions (Signed)

## 2017-09-09 NOTE — Progress Notes (Addendum)
Site area: RFA Site Prior to Removal:  Level 0 Pressure Applied For: 20 min Manual:  yes  Patient Status During Pull:  stable Post Pull Site:  Level 0 Post Pull Instructions Given:  yes Post Pull Pulses Present: palpable Dressing Applied:  tegadern Bedrest begins @ 0930 till 1330 Comments:

## 2017-09-16 ENCOUNTER — Telehealth (HOSPITAL_COMMUNITY): Payer: Self-pay

## 2017-09-16 ENCOUNTER — Ambulatory Visit (HOSPITAL_COMMUNITY)
Admission: RE | Admit: 2017-09-16 | Discharge: 2017-09-16 | Disposition: A | Discharge status: Home / Self Care | Payer: Medicare Other | Source: Ambulatory Visit | Attending: Cardiovascular Disease | Admitting: Cardiovascular Disease

## 2017-09-16 ENCOUNTER — Encounter (HOSPITAL_COMMUNITY): Payer: Self-pay

## 2017-09-16 DIAGNOSIS — I739 Peripheral vascular disease, unspecified: Secondary | ICD-10-CM

## 2017-09-16 NOTE — Telephone Encounter (Signed)
Medication refill request - [facility]ax received from [facility] stating patient is requesting they request a new order for Valium which patient has not been on recently. Pt. next appt. 11/25/17.

## 2017-09-17 ENCOUNTER — Other Ambulatory Visit (HOSPITAL_COMMUNITY): Payer: Self-pay | Admitting: Psychiatry

## 2017-09-17 ENCOUNTER — Encounter: Payer: Self-pay | Admitting: Cardiovascular Disease

## 2017-09-17 NOTE — Telephone Encounter (Signed)
She mentions she is not taking Valium and it was discontinued.  She is taking multiple pain medication and should not be taking any more benzodiazepine.

## 2017-09-20 ENCOUNTER — Other Ambulatory Visit (HOSPITAL_COMMUNITY): Payer: Self-pay | Admitting: Psychiatry

## 2017-09-20 ENCOUNTER — Telehealth (HOSPITAL_COMMUNITY): Payer: Self-pay

## 2017-09-20 ENCOUNTER — Telehealth: Payer: Self-pay | Admitting: Internal Medicine

## 2017-09-20 ENCOUNTER — Other Ambulatory Visit: Payer: Self-pay

## 2017-09-20 MED ORDER — PREDNISONE 10 MG PO TABS: ORAL_TABLET | ORAL | 0 refills | Status: DC

## 2017-09-20 MED ORDER — DOXYCYCLINE HYCLATE 100 MG PO TABS: 100.0000 mg | ORAL_TABLET | Freq: Two times a day (BID) | ORAL | 0 refills | Status: DC

## 2017-09-20 NOTE — Telephone Encounter (Signed)
We can provide Valium 2 mg 10 tablets as needed for severe anxiety.

## 2017-09-20 NOTE — Telephone Encounter (Signed)
Doxycycline 100 mg for 7 days Prednisone 10 mg tabs  Take 2 tabs daily with food x 5ds, then 1 tab daily with food x 5ds then STOP

## 2017-09-20 NOTE — Telephone Encounter (Signed)
Called pt and advised message from the provider. Pt understood and verbalized understanding. Nothing further is needed.   Rx sent into her pharmacy.  

## 2017-09-20 NOTE — Telephone Encounter (Signed)
Pt c/o increased prod cough with yellow/white mucus Xseveral days.  Has sinus and chest congestion, PND, dyspnea.  Pt denies fever, chest pain, body aches, chills.  Pt has not taken anything to help with s/s, requesting a pred taper.    [facility] [address]    Sending to DOD as MR is not on qgenda schedule.  RA please advise.  Thanks!

## 2017-09-20 NOTE — Telephone Encounter (Signed)
Patient is calling about being denied the prescription of Valium. She states that she only takes it once and awhile and has only taken 10 in the past year. Her last prescription still had a refill on it, but the prescription had expired and the pharmacy would not fill. She states she is going through a lot of medical problems lately and her anxiety is increased some days. She states she will only take as needed and asks that you reconsider. Please review and advise, thank you

## 2017-09-21 MED ORDER — DIAZEPAM 2 MG PO TABS: 2.0000 mg | ORAL_TABLET | Freq: Every day | ORAL | 0 refills | Status: DC | PRN

## 2017-09-21 NOTE — Telephone Encounter (Signed)
Called 10 tablets into the pharmacy and called patient to let her know.

## 2017-09-22 ENCOUNTER — Ambulatory Visit (INDEPENDENT_AMBULATORY_CARE_PROVIDER_SITE_OTHER): Payer: Medicare Other | Admitting: Pharmacist

## 2017-09-22 ENCOUNTER — Encounter: Payer: Self-pay | Admitting: Cardiovascular Disease

## 2017-09-22 ENCOUNTER — Ambulatory Visit (INDEPENDENT_AMBULATORY_CARE_PROVIDER_SITE_OTHER): Payer: Medicare Other | Admitting: Cardiovascular Disease

## 2017-09-22 VITALS — BP 152/87 | HR 79 | Ht 63.0 in | Wt 190.6 lb

## 2017-09-22 DIAGNOSIS — Z7901 Long term (current) use of anticoagulants: Secondary | ICD-10-CM | POA: Diagnosis not present

## 2017-09-22 DIAGNOSIS — I480 Paroxysmal atrial fibrillation: Secondary | ICD-10-CM

## 2017-09-22 DIAGNOSIS — I739 Peripheral vascular disease, unspecified: Secondary | ICD-10-CM

## 2017-09-22 LAB — POCT INR: INR: 2.4

## 2017-09-22 NOTE — Assessment & Plan Note (Signed)
Laurie Horton returns today after her recent peripheral angiogram which I performed 09/09/17 because otherwise tolerating claudication. She had 80% calcified infrarenal abdominal aortic stenosis on a bend which was too high risk for percutaneous intervention. 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 She does have lifestyle limiting claudication left greater than right. I'm going to refer her to Dr. Trula Slade to discuss the possibility of aortobifemoral bypass grafting. If she is too high risk I will entertain diamondback orbital rotational arthrectomy and stenting of her left common iliac artery.

## 2017-09-22 NOTE — Progress Notes (Signed)
Ms. Keir returns today after her recent peripheral angiogram which I performed 09/09/17 because otherwise tolerating claudication. She had 80% calcified infrarenal abdominal aortic stenosis on a bend which was too high risk for percutaneous intervention. 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 She does have lifestyle limiting claudication left greater than right. I'm going to refer her to Dr. Trula Slade to discuss the possibility of aortobifemoral bypass grafting. If she is too high risk I will entertain diamondback orbital rotational arthrectomy and stenting of her left common iliac artery.  Lorretta Harp, M.D., [place], [facility], [place] [address] [place], [place]  [address]  [phone] 09/22/2017 11:31 AM

## 2017-09-22 NOTE — Patient Instructions (Signed)
Your physician recommends that you schedule a follow-up appointment in: 6-8 WEEKS WITH DR BERRY  REFERRAL TO DR Trula Slade APPOINTMENT IN THE NEXT 2-3 WEEKS PER DR BERRY TO DISCUSS AORTA BI-FEM

## 2017-09-29 ENCOUNTER — Other Ambulatory Visit: Payer: Self-pay | Admitting: Family Medicine

## 2017-10-12 ENCOUNTER — Ambulatory Visit: Payer: Medicare Other | Admitting: Cardiology

## 2017-10-13 ENCOUNTER — Telehealth: Payer: Self-pay | Admitting: Cardiology

## 2017-10-13 ENCOUNTER — Other Ambulatory Visit: Payer: Self-pay

## 2017-10-13 MED ORDER — ROSUVASTATIN CALCIUM 5 MG PO TABS: 5.0000 mg | ORAL_TABLET | ORAL | 3 refills | Status: DC

## 2017-10-13 NOTE — Telephone Encounter (Signed)
New Message      *STAT* If patient is at the pharmacy, call can be transferred to refill team.   1. Which medications need to be refilled? (please list name of each medication and dose if known) crestor   2. Which pharmacy/location (including street and city if local pharmacy) is [facility] [address]   3. Do they need a 30 day or 90 day supply? [place]

## 2017-10-15 ENCOUNTER — Ambulatory Visit: Payer: Self-pay | Admitting: Family Medicine

## 2017-10-15 ENCOUNTER — Ambulatory Visit: Payer: Self-pay | Admitting: *Deleted

## 2017-10-15 NOTE — Telephone Encounter (Signed)
Mrs. Bartel called with complaint of stool impaction for 2 days now. She is on miralax daily and has used a suppository with no results and now is having abdominal pain.  She states no one can drive her to a 0:27 appointment that I've made for her today. She voiced she would not call 911 for help. She refused an appointment for tomorrow as well.  Reason for Disposition . Unable to have a bowel movement (BM) without manually removing stool (using finger to pull out stool or perform disimpaction)  Answer Assessment - Initial Assessment Questions 1. STOOL PATTERN OR FREQUENCY: "How often do you pass bowel movements (BMs)?"  (Normal range: tid to q 3 days)  "When was the last BM passed?"       Impacted stool for 2 days now.  2. STRAINING: "Do you have to strain to have a BM?"    yes 3. RECTAL PAIN: "Does your rectum hurt when the stool comes out?" If so, ask: "Do you have hemorrhoids? How bad is the pain?"  (Scale 1-10; or mild, moderate, severe)    Yes it hurts and hemorrhoids. Severe pain today 4. STOOL COMPOSITION: "Are the stools hard?"      Small hard balls coming out 5. BLOOD ON STOOLS: "Has there been any blood on the toilet tissue or on the surface of the BM?" If so, ask: "When was the last time?"      Blood on tissue today 6. CHRONIC CONSTIPATION: "Is this a new problem for you?"  If no, ask: How long have you had this problem?" (days, weeks, months)    She has had this problem over the years but the last few days its really bad and painful. 7. CHANGES IN DIET: "Have there been any recent changes in your diet?"      no 8. MEDICATIONS: "Have you been taking any new medications?"    no 9. LAXATIVES: "Have you been using any laxatives or enemas?"  If yes, ask "What, how often, and when was the last time?"     Taken miralax once a day 10. CAUSE: "What do you think is causing the constipation?"        Takes several pain meds for chronic spine pain, is under pain management with [facility]. 11.  OTHER SYMPTOMS: "Do you have any other symptoms?" (e.g., abdominal pain, fever, vomiting)     Bloated abdomen 12. PREGNANCY: "Is there any chance you are pregnant?" "When was your last menstrual period?"      no  Protocols used: CONSTIPATION-A-AH

## 2017-10-18 ENCOUNTER — Ambulatory Visit (INDEPENDENT_AMBULATORY_CARE_PROVIDER_SITE_OTHER): Payer: Medicare Other | Admitting: Surgery

## 2017-10-18 ENCOUNTER — Encounter: Payer: Self-pay | Admitting: Surgery

## 2017-10-18 VITALS — BP 121/63 | HR 64 | Temp 99.1°F | Resp 20 | Ht 63.0 in | Wt 186.0 lb

## 2017-10-18 DIAGNOSIS — I70213 Atherosclerosis of native arteries of extremities with intermittent claudication, bilateral legs: Secondary | ICD-10-CM

## 2017-10-18 NOTE — Progress Notes (Signed)
[facility]  Patient name: Laurie Horton MRN: [id number] DOB: 1946-07-22 Sex: female   REQUESTING PROVIDER:    Dr. Gwenlyn Found   REASON FOR CONSULT:    Claudication  HISTORY OF PRESENT ILLNESS:   Laurie Horton is a 71 y.o. female, who is referred today for evaluation of bilateral leg pain.  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  The patient states that she can walk 50-100 feet before her legs give out.  Resting helps.  She does not have any open wounds.  She is very tearful because she does not want to end up in a wheelchair.  The patient has a history of a stroke following knee surgery.  She does have a patent foramen ovale and had a DVT at the time of her surgery.  She is on anticoagulation.  She also suffers from two-vessel coronary artery disease diagnosed by catheterization in May 2018.  She is medically managed for ischemic cardiomyopathy. She is a former smoker.  She is medically managed for hypertension.  She takes a statin for hypercholesterolemia.  She carries a diagnosis of fibromyalgia.  PAST MEDICAL HISTORY    Past Medical History:  Diagnosis Date  . CHF (congestive heart failure) ([place]) 03/02/2017   EF 25-30% 2018  . Complication of anesthesia    various issues with oxygen  saturations post op  . COPD (chronic obstructive pulmonary disease) ([place])   . Depression   . Diverticulitis   . DVT (deep venous thrombosis) ([place])   . Fibromyalgia   . GERD (gastroesophageal reflux disease)   . Hiatal hernia   . History of deviated nasal septum    left- side  . HTN (hypertension)   . Hyperlipidemia   . Obesity   . On supplemental oxygen therapy    concentrator at night @ 1.5 l/m or when sleeps. O2 Sat niormally 87.  . Osteoporosis   . PAT (paroxysmal atrial tachycardia) ([place])   . PFO (patent foramen  ovale)   . PULMONARY NODULE, LEFT LOWER LOBE 10/14/2009   1mm LLL nodule dec 2010. Stable and 58mm in Oct 2012. No further fu  . Right middle lobe pneumonia ([place]) 07/24/2011   First noted at admit 07/10/11. Persists on cxr 07/22/11. Cleared on CT 08/24/11. No further followup  . Stroke ([place])   . TOBACCO ABUSE 06/04/2009     FAMILY HISTORY   Family History  Problem Relation Age of Onset  . Dementia Mother   . Diabetes Mother   . Alzheimer's disease Mother   . Heart attack Brother 59  . Heart attack Father   . Schizophrenia Sister   . Diabetes Sister   . Tremor Sister     SOCIAL HISTORY:   Social History   Socioeconomic History  . Marital status: Divorced    Spouse name: Not on file  . Number of children: 2  . Years of education: Not on file  . Highest education level: Not on file  Social Needs  . Financial resource strain: Not on file  . Food insecurity - worry: Not on file  . Food insecurity - inability: Not on file  . Transportation needs - medical: Not on file  . Transportation needs - non-medical: Not on file  Occupational History  . Occupation: disability  Tobacco Use  . Smoking status: Former Smoker    Last attempt to quit: 06/02/2010  Years since quitting: 7.3  . Smokeless tobacco: Never Used  Substance and Sexual Activity  . Alcohol use: No    Alcohol/week: 0.0 oz  . Drug use: No  . Sexual activity: Yes    Partners: Male    Birth control/protection: None  Other Topics Concern  . Not on file  Social History Narrative   Marital status: divorced in 1978 after ten years; dating casually      Children: 3 biological children; 3 court appointed children; 6 grandchildren; 5 gg      Lives: alone; children in [place] and [place].      Employment: retired age 81; disability for COPD, CVA at age 45.      Tobacco: former smoker; quit smoking 2011.      Alcohol: none      Exercise: walks dog four times per day; goes to pool three times per week.       ADLs:  drives; no assistant devices; does have a walker.  Cleaning is limited in 2018.  Daughter helps with cleaning.  Does own grocery shopping.      Advanced Directives: YES; DNR/DNI; HCPOA:           ALLERGIES:    Allergies  Allergen Reactions  . Alprazolam Other (See Comments)    REACTION: stops breathing  . Bee Venom Anaphylaxis  . Iodine Anaphylaxis and Swelling    REACTION: swelling in throat  . Pseudoephedrine Hcl Er Shortness Of Breath  . Budesonide-Formoterol Fumarate Other (See Comments)    Blisters inside of mouth all over  . Crestor [Rosuvastatin Calcium] Other (See Comments)    Unable to walk  . Esomeprazole Magnesium Other (See Comments)    REACTION: "bouncing off walls"  . Flonase [Fluticasone Propionate] Other (See Comments)    NOSE BLEED  . Loratadine Other (See Comments)    claritin D causes shaking  . Lotrimin [Clotrimazole] Other (See Comments)    Mouth blisters  . Lunesta [Eszopiclone] Other (See Comments)    REACTION: "slept for a week"  . Oxcarbazepine Other (See Comments)    Causes deep sleep and dizziness  . Statins Other (See Comments)    Can't walk, legs won't work   . Zolpidem Tartrate Other (See Comments)    REACTION: "slept for a week"  . Clarithromycin Other (See Comments)    All "mycins" Puts into "a" fib Will take if has to for severe sinus infection  . Effexor [Venlafaxine] Nausea And Vomiting    cramps  . Lexapro [Escitalopram Oxalate] Other (See Comments)    hallucinations  . Aciphex [Rabeprazole Sodium] Rash  . Avelox [Moxifloxacin Hcl In Nacl] Other (See Comments)    Stomach cramps.   Darlin Coco [Valdecoxib] Rash  . Ceclor [Cefaclor] Rash  . Cephalexin Rash    Pt states that she is possibly allergic to this - had a reaction to Cefaclor in the past and she does not want to these class drugs. Added per patient request.  . Covera-Hs [Verapamil Hcl] Palpitations  . Dicyclomine Hcl Rash  . Other Other (See Comments)    Glue from ekg/heart  monitor leads --rash +  Any MYCINS  . Tessalon Perles Rash    CURRENT MEDICATIONS:    Current Outpatient Medications  Medication Sig Dispense Refill  . carvedilol (COREG) 12.5 MG tablet Take 1 tablet (12.5 mg total) by mouth 2 (two) times daily. 60 tablet 11  . desvenlafaxine (PRISTIQ) 50 MG 24 hr tablet Take 1 tablet (50 mg total) by  mouth daily. 30 tablet 2  . diazepam (VALIUM) 2 MG tablet Take 1 tablet (2 mg total) by mouth daily as needed for anxiety. 10 tablet 0  . furosemide (LASIX) 40 MG tablet Take 1 tablet (40 mg total) by mouth daily. 30 tablet 11  . gabapentin (NEURONTIN) 100 MG capsule Take 100-200 mg by mouth 3 (three) times daily. 100 mg in am, 200 mg at noon and  200 mg at bedtime    . HYDROcodone-acetaminophen (NORCO/VICODIN) 5-325 MG tablet Take 1 tablet by mouth 3 (three) times daily.     Marland Kitchen ipratropium (ATROVENT) 0.02 % nebulizer solution USE 1 VIAL BY NEBULIZATION 4 (FOUR) TIMES DAILY. (Patient taking differently: USE 1 VIAL BY NEBULIZATION  3 TIMES DAILY AS NEEDED FOR SHORTNESS OF BREATH) 75 mL 3  . losartan (COZAAR) 25 MG tablet Take 1 tablet (25 mg total) by mouth daily. 30 tablet 11  . montelukast (SINGULAIR) 10 MG tablet TAKE 1 TABLET BY MOUTH EVERYDAY AT BEDTIME (Patient taking differently: TAKE 1 TABLET BY MOUTH EVERYDAY) 30 tablet 1  . ondansetron (ZOFRAN ODT) 8 MG disintegrating tablet Take 1 tablet (8 mg total) by mouth every 8 (eight) hours as needed for nausea or vomiting. 20 tablet 0  . oxybutynin (DITROPAN-XL) 10 MG 24 hr tablet TAKE 1 TABLET BY MOUTH EVERY DAY 30 tablet 0  . pantoprazole (PROTONIX) 40 MG tablet Take 1 tablet by mouth daily.     . polyethylene glycol (MIRALAX / GLYCOLAX) packet Take 17 g by mouth daily as needed for mild constipation. TAKES TEASPOONFUL IN 8 OZ OF WATER    . rosuvastatin (CRESTOR) 5 MG tablet Take 1 tablet (5 mg total) by mouth once a week. Sunday 12 tablet 3  . spironolactone (ALDACTONE) 25 MG tablet Take 1 tablet (25 mg  total) by mouth daily. 30 tablet 11  . tiZANidine (ZANAFLEX) 2 MG tablet Take 2 mg by mouth 3 (three) times daily as needed for muscle spasms. Takes a 1/2-1 tablet depending on pain level    . traZODone (DESYREL) 100 MG tablet Take 1 tablet (100 mg total) by mouth at bedtime. 90 tablet 0  . triamcinolone ointment (KENALOG) 0.1 % Apply 1 application topically 3 (three) times daily as needed (EAR IRRITATION).     Marland Kitchen warfarin (COUMADIN) 5 MG tablet TAKE 1/2 TO 1 TABLET BY MOUTH EVERY DAY OR AS DIRECTED BY COUMADIN CLINIC 90 tablet 0   No current facility-administered medications for this visit.     REVIEW OF SYSTEMS:   [X]  denotes positive finding, [ ]  denotes negative finding Cardiac  Comments:  Chest pain or chest pressure:    Shortness of breath upon exertion: x   Short of breath when lying flat:    Irregular heart rhythm: x       Vascular    Pain in calf, thigh, or hip brought on by ambulation: x   Pain in feet at night that wakes you up from your sleep:  x   Blood clot in your veins: x   Leg swelling:  x       Pulmonary    Oxygen at home: x   Productive cough:  x   Wheezing:         Neurologic    Sudden weakness in arms or legs:  x   Sudden numbness in arms or legs:  x   Sudden onset of difficulty speaking or slurred speech:    Temporary loss of vision in one eye:  Problems with dizziness:         Gastrointestinal    Blood in stool:      Vomited blood:         Genitourinary    Burning when urinating:     Blood in urine:        Psychiatric    Major depression:  x       Hematologic    Bleeding problems:    Problems with blood clotting too easily: x       Skin    Rashes or ulcers:        Constitutional    Fever or chills:     PHYSICAL EXAM:   There were no vitals filed for this visit.  GENERAL: The patient is a well-nourished female, in no acute distress. The vital signs are documented above. CARDIAC: There is a regular rate and rhythm.  VASCULAR:  Pedal pulses not palpable.  No carotid bruits. PULMONARY: Nonlabored respirations ABDOMEN: Soft and non-tender.  Umbilical and epigastric port site incisions.  She does have a small ventral hernia in the superior aspect of her abdomen. MUSCULOSKELETAL: There are no major deformities or cyanosis. NEUROLOGIC: No focal weakness or paresthesias are detected. SKIN: There are no ulcers or rashes noted. PSYCHIATRIC: The patient has a normal affect.  STUDIES:   Carotid:  40-59% Left ICA stenosis, minimal right  I have reviewed the patient's arteriogram which shows a high-grade stenosis within the infrarenal abdominal aorta as well as left common iliac and right common femoral arteries.  There is three-vessel runoff  ASSESSMENT and PLAN   Severe claudication: The patient states that she would rather die than and that the in a wheelchair.  She is tearful because she cannot walk more than 50-100 feet before her legs give out.  We discussed proceeding with an aortobifemoral bypass graft.  I discussed the risks and benefits of the operation including the risk of death, stroke, lower extremity embolization, intestinal ischemia, wound infections, and cardiopulmonary complications.  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  She has a known patent foramen ovale.  I am going to place a IVC filter prior to her operation.  We discussed removing this when she has recovered from her operation.  She will also need formal cardiology and pulmonary clearance.  We will try to arrange this and schedule her for surgery in late January/early February.  She does have a abdominal hernia but I will need to address at the time of her surgery.  Annamarie Major, MD [facility] [phone] Pager [phone]

## 2017-10-20 ENCOUNTER — Encounter: Payer: Self-pay | Admitting: Family Medicine

## 2017-10-20 ENCOUNTER — Ambulatory Visit (INDEPENDENT_AMBULATORY_CARE_PROVIDER_SITE_OTHER): Payer: Medicare Other | Admitting: Family Medicine

## 2017-10-20 ENCOUNTER — Other Ambulatory Visit: Payer: Self-pay

## 2017-10-20 VITALS — BP 130/81 | HR 88 | Temp 98.3°F | Resp 16 | Ht 63.39 in | Wt 180.0 lb

## 2017-10-20 DIAGNOSIS — I4891 Unspecified atrial fibrillation: Secondary | ICD-10-CM

## 2017-10-20 DIAGNOSIS — E7800 Pure hypercholesterolemia, unspecified: Secondary | ICD-10-CM | POA: Diagnosis not present

## 2017-10-20 DIAGNOSIS — J9611 Chronic respiratory failure with hypoxia: Secondary | ICD-10-CM | POA: Diagnosis not present

## 2017-10-20 DIAGNOSIS — Z1211 Encounter for screening for malignant neoplasm of colon: Secondary | ICD-10-CM | POA: Diagnosis not present

## 2017-10-20 DIAGNOSIS — F411 Generalized anxiety disorder: Secondary | ICD-10-CM

## 2017-10-20 DIAGNOSIS — K5903 Drug induced constipation: Secondary | ICD-10-CM

## 2017-10-20 DIAGNOSIS — J449 Chronic obstructive pulmonary disease, unspecified: Secondary | ICD-10-CM

## 2017-10-20 DIAGNOSIS — E2839 Other primary ovarian failure: Secondary | ICD-10-CM

## 2017-10-20 DIAGNOSIS — I10 Essential (primary) hypertension: Secondary | ICD-10-CM | POA: Diagnosis not present

## 2017-10-20 DIAGNOSIS — Z1231 Encounter for screening mammogram for malignant neoplasm of breast: Secondary | ICD-10-CM | POA: Diagnosis not present

## 2017-10-20 DIAGNOSIS — I5022 Chronic systolic (congestive) heart failure: Secondary | ICD-10-CM

## 2017-10-20 NOTE — Patient Instructions (Signed)
     IF you received an x-ray today, you will receive an invoice from [facility]. Please contact [facility] at [phone] with questions or concerns regarding your invoice.   IF you received labwork today, you will receive an invoice from [facility]. Please contact [facility] at [phone] with questions or concerns regarding your invoice.   Our billing staff will not be able to assist you with questions regarding bills from these companies.  You will be contacted with the lab results as soon as they are available. The fastest way to get your results is to activate your My Chart account. Instructions are located on the last page of this paperwork. If you have not heard from us regarding the results in 2 weeks, please contact this office.     

## 2017-10-20 NOTE — Progress Notes (Signed)
Subjective:    Patient ID: Laurie Horton, female    DOB: [birth year], 71 y.o.   MRN: [id number]  10/20/2017  Chronic Conditions (6 month follow-up )    HPI This 71 y.o. 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 -obtain age appropriate screening labs and labs for chronic disease management. -moderate fall risk; undergoing treatment for depression; no evidence of hearing loss.  Discussed advanced directives and living will; also discussed end of life issues including code status.  -pt refused Pneumovax. -colonoscopy UTD: will need to obtain copy of most recent colonoscopy from [facility]. -recent hospital admission records reviewed in detail during visit.  Considering defibrillator for CHF with EF 25%.  -followed closely by cardiology.  Recent increase in Coreg and Aldactone. -followed by pain management for chronic DDD lumbar spine. -followed closely by psychiatry and mental illness is significant comorbidity affecting overall well-being.   Constipation:  Called for an appointment; offered an appointment the very next day; very upset with lack of medical advice from RN; pt did not want an appointment but OTC recommendations.  Did end up calling neighbor.  Recommended getting MOM and fleets enema oil.  Went through box of gloves disimpacting self.   Actually has excellent neighbors yet pt rarely asks for help.  Chronic opiate use.   In note from [facility], offered 3:40 appointment yet pt had no transportation.   Unable to schedule bone density scan.  Aortic blockage, one blockage in iliac, and one blockage in another area; CVTS; [facility] has not seen these type of  blockages.  [place] sent patient to [facility] because he does vascular work.  Unable to walk due to claudication; toes and legs are turning blue.  Toes turn navy blue. Must walk around.  BP Readings from Last 3 Encounters:  10/20/17 130/81  10/18/17 121/63  09/22/17 (!) 152/87   Wt Readings from Last 3 Encounters:  10/20/17 180 lb (81.6 kg)  10/18/17 186 lb (84.4 kg)  09/22/17 190 lb 9.6 oz (86.5 kg)   Immunization History  Administered Date(s) Administered  . Influenza Split 08/03/2011, 08/01/2012  . Influenza,inj,Quad PF,6+ Mos 08/14/2013, 09/03/2014, 09/04/2015, 08/18/2016  . Pneumococcal Conjugate-13 10/01/2014  . Pneumococcal Polysaccharide-23 09/03/1999, 06/02/2006, 08/14/2013  . Td 11/02/1994  . Tdap 08/18/2016    Review of Systems  Constitutional: Negative for chills, diaphoresis, fatigue and fever.  Eyes: Negative for visual disturbance.  Respiratory: Negative for cough and shortness of breath.   Cardiovascular: Negative for chest pain, palpitations and leg swelling.  Gastrointestinal: Positive for constipation. Negative for abdominal pain, anal bleeding, blood in stool, diarrhea, nausea and vomiting.  Endocrine: Negative for cold intolerance, heat intolerance, polydipsia, polyphagia and polyuria.  Musculoskeletal: Positive for arthralgias and back pain.  Neurological: Negative for dizziness, tremors, seizures, syncope, facial asymmetry, speech difficulty, weakness, light-headedness, numbness and headaches.  Psychiatric/Behavioral: Positive for dysphoric mood and sleep disturbance. The patient is nervous/anxious.     Past Medical History:  Diagnosis Date  . CHF (congestive heart failure) (HCC) 03/02/2017   EF 25-30% 2018  . Complication of anesthesia    various issues with oxygen  saturations post op  . COPD (chronic obstructive pulmonary disease) (HCC)   . Depression   . Diverticulitis   . DVT (  deep venous thrombosis) (HCC)   . Fibromyalgia   . GERD (gastroesophageal  reflux disease)   . Hiatal hernia   . History of deviated nasal septum    left- side  . HTN (hypertension)   . Hyperlipidemia   . Obesity   . On supplemental oxygen therapy    concentrator at night @ 1.5 l/m or when sleeps. O2 Sat niormally 87.  . Osteoporosis   . PAT (paroxysmal atrial tachycardia) (HCC)   . PFO (patent foramen ovale)   . PULMONARY NODULE, LEFT LOWER LOBE 10/14/2009   5mm LLL nodule dec 2010. Stable and 4mm in Oct 2012. No further fu  . Right middle lobe pneumonia (HCC) 07/24/2011   First noted at admit 07/10/11. Persists on cxr 07/22/11. Cleared on CT 08/24/11. No further followup  . Stroke (HCC)   . TOBACCO ABUSE 06/04/2009   Past Surgical History:  Procedure Laterality Date  . CARDIOVASCULAR STRESS TEST  12/26/2004   EF 74%. NO EVIDENCE OF ISCHEMIA  . ESOPHAGOGASTRODUODENOSCOPY (EGD) WITH PROPOFOL N/A 04/15/2015   Procedure: ESOPHAGOGASTRODUODENOSCOPY (EGD) WITH PROPOFOL;  Surgeon: Carman Ching, MD;  Location: [facility];  Service: Endoscopy;  Laterality: N/A;  . KNEE ARTHROSCOPY  2000   left  . LAPAROSCOPIC CHOLECYSTECTOMY  04-16-2010   [place]  . LOWER EXTREMITY ANGIOGRAPHY N/A 09/09/2017   Procedure: Lower Extremity Angiography;  Surgeon: Runell Gess, MD;  Location: [facility] INVASIVE CV LAB;  Service: Cardiovascular;  Laterality: N/A;  . MOUTH SURGERY     03-26-15 multiple extractions stitches remains  . RIGHT/LEFT HEART CATH AND CORONARY ANGIOGRAPHY N/A 03/04/2017   Procedure: Right/Left Heart Cath and Coronary Angiography;  Surgeon: Peter M [place], MD;  Location: [facility] INVASIVE CV LAB;  Service: Cardiovascular;  Laterality: N/A;  . TOTAL ABDOMINAL HYSTERECTOMY     post op needed oxygen was told "she gave them a scare"  . TUBAL LIGATION    . US ECHOCARDIOGRAPHY  11/20/2009   EF 55-60%   Allergies  Allergen Reactions  . Alprazolam Other (See Comments)    REACTION: stops breathing  . Bee Venom Anaphylaxis  . Iodine Anaphylaxis and Swelling    REACTION: swelling  in throat  . Pseudoephedrine Hcl Er Shortness Of Breath  . Budesonide-Formoterol Fumarate Other (See Comments)    Blisters inside of mouth all over  . Crestor [Rosuvastatin Calcium] Other (See Comments)    Unable to walk  . Esomeprazole Magnesium Other (See Comments)    REACTION: "bouncing off walls"  . Flonase [Fluticasone Propionate] Other (See Comments)    NOSE BLEED  . Loratadine Other (See Comments)    claritin D causes shaking  . Lotrimin [Clotrimazole] Other (See Comments)    Mouth blisters  . Lunesta [Eszopiclone] Other (See Comments)    REACTION: "slept for a week"  . Oxcarbazepine Other (See Comments)    Causes deep sleep and dizziness  . Statins Other (See Comments)    Can't walk, legs won't work   . Zolpidem Tartrate Other (See Comments)    REACTION: "slept for a week"  . Clarithromycin Other (See Comments)    All "mycins" Puts into "a" fib Will take if has to for severe sinus infection  . Effexor [Venlafaxine] Nausea And Vomiting    cramps  . Lexapro [Escitalopram Oxalate] Other (See Comments)    hallucinations  . Aciphex [Rabeprazole Sodium] Rash  . Avelox [Moxifloxacin Hcl In Nacl] Other (See Comments)    Stomach cramps.   Marlowe Aschoff [Valdecoxib] Rash  . Ceclor [  Cefaclor] Rash  . Cephalexin Rash    Pt states that she is possibly allergic to this - had a reaction to Cefaclor in the past and she does not want to these class drugs. Added per patient request.  . Covera-Hs [Verapamil Hcl] Palpitations  . Dicyclomine Hcl Rash  . Other Other (See Comments)    Glue from ekg/heart monitor leads --rash +  Any MYCINS  . Tessalon Perles Rash   Current Outpatient Medications on File Prior to Visit  Medication Sig Dispense Refill  . carvedilol (COREG) 12.5 MG tablet Take 1 tablet (12.5 mg total) by mouth 2 (two) times daily. 60 tablet 11  . desvenlafaxine (PRISTIQ) 50 MG 24 hr tablet Take 1 tablet (50 mg total) by mouth daily. 30 tablet 2  . diazepam (VALIUM) 2 MG  tablet Take 1 tablet (2 mg total) by mouth daily as needed for anxiety. 10 tablet 0  . furosemide (LASIX) 40 MG tablet Take 1 tablet (40 mg total) by mouth daily. 30 tablet 11  . gabapentin (NEURONTIN) 100 MG capsule Take 100-200 mg by mouth 3 (three) times daily. 100 mg in am, 200 mg at noon and  200 mg at bedtime    . HYDROcodone-acetaminophen (NORCO/VICODIN) 5-325 MG tablet Take 1 tablet by mouth 3 (three) times daily.     Marland Kitchen ipratropium (ATROVENT) 0.02 % nebulizer solution USE 1 VIAL BY NEBULIZATION 4 (FOUR) TIMES DAILY. (Patient taking differently: USE 1 VIAL BY NEBULIZATION  3 TIMES DAILY AS NEEDED FOR SHORTNESS OF BREATH) 75 mL 3  . losartan (COZAAR) 25 MG tablet Take 1 tablet (25 mg total) by mouth daily. 30 tablet 11  . montelukast (SINGULAIR) 10 MG tablet TAKE 1 TABLET BY MOUTH EVERYDAY AT BEDTIME (Patient taking differently: TAKE 1 TABLET BY MOUTH EVERYDAY) 30 tablet 1  . ondansetron (ZOFRAN ODT) 8 MG disintegrating tablet Take 1 tablet (8 mg total) by mouth every 8 (eight) hours as needed for nausea or vomiting. 20 tablet 0  . oxybutynin (DITROPAN-XL) 10 MG 24 hr tablet TAKE 1 TABLET BY MOUTH EVERY DAY 30 tablet 0  . pantoprazole (PROTONIX) 40 MG tablet Take 1 tablet by mouth daily.     . polyethylene glycol (MIRALAX / GLYCOLAX) packet Take 17 g by mouth daily as needed for mild constipation. TAKES TEASPOONFUL IN 8 OZ OF WATER    . rosuvastatin (CRESTOR) 5 MG tablet Take 1 tablet (5 mg total) by mouth once a week. Sunday 12 tablet 3  . spironolactone (ALDACTONE) 25 MG tablet Take 1 tablet (25 mg total) by mouth daily. 30 tablet 11  . tiZANidine (ZANAFLEX) 2 MG tablet Take 2 mg by mouth 3 (three) times daily as needed for muscle spasms. Takes a 1/2-1 tablet depending on pain level    . traZODone (DESYREL) 100 MG tablet Take 1 tablet (100 mg total) by mouth at bedtime. 90 tablet 0  . triamcinolone ointment (KENALOG) 0.1 % Apply 1 application topically 3 (three) times daily as needed (EAR  IRRITATION).     Marland Kitchen warfarin (COUMADIN) 5 MG tablet TAKE 1/2 TO 1 TABLET BY MOUTH EVERY DAY OR AS DIRECTED BY COUMADIN CLINIC 90 tablet 0   No current facility-administered medications on file prior to visit.    Social History   Socioeconomic History  . Marital status: Divorced    Spouse name: Not on file  . Number of children: 2  . Years of education: Not on file  . Highest education level: Not on file  Social Needs  . Financial resource strain: Not on file  . Food insecurity - worry: Not on file  . Food insecurity - inability: Not on file  . Transportation needs - medical: Not on file  . Transportation needs - non-medical: Not on file  Occupational History  . Occupation: disability  Tobacco Use  . Smoking status: Former Smoker    Last attempt to quit: 06/02/2010    Years since quitting: 7.3  . Smokeless tobacco: Never Used  Substance and Sexual Activity  . Alcohol use: No    Alcohol/week: 0.0 oz  . Drug use: No  . Sexual activity: Yes    Partners: Male    Birth control/protection: None  Other Topics Concern  . Not on file  Social History Narrative   Marital status: divorced in 1978 after ten years; dating casually      Children: 3 biological children; 3 court appointed children; 6 grandchildren; 5 gg      Lives: alone; children in [place] and [place].      Employment: retired age 74; disability for COPD, CVA at age 50.      Tobacco: former smoker; quit smoking 2011.      Alcohol: none      Exercise: walks dog four times per day; goes to pool three times per week.       ADLs: drives; no assistant devices; does have a walker.  Cleaning is limited in 2018.  Daughter helps with cleaning.  Does own grocery shopping.      Advanced Directives: YES; DNR/DNI; HCPOA:          Family History  Problem Relation Age of Onset  . Dementia Mother   . Diabetes Mother   . Alzheimer's disease Mother   . Heart attack Brother 40  . Heart attack Father   . Schizophrenia Sister   .  Diabetes Sister   . Tremor Sister        Objective:    BP 130/81   Pulse 88   Temp 98.3 F (36.8 C) (Oral)   Resp 16   Ht 5' 3.39" (1.61 m)   Wt 180 lb (81.6 kg)   SpO2 94%   BMI 31.50 kg/m  Physical Exam  Constitutional: She is oriented to person, place, and time. She appears well-developed and well-nourished. No distress.  HENT:  Head: Normocephalic and atraumatic.  Right Ear: External ear normal.  Left Ear: External ear normal.  Nose: Nose normal.  Mouth/Throat: Oropharynx is clear and moist.  Eyes: Conjunctivae and EOM are normal. Pupils are equal, round, and reactive to light.  Neck: Normal range of motion. Neck supple. Carotid bruit is not present. No thyromegaly present.  Cardiovascular: Normal rate, regular rhythm, normal heart sounds and intact distal pulses. Exam reveals no gallop and no friction rub.  No murmur heard. Pulmonary/Chest: Effort normal and breath sounds normal. She has no wheezes. She has no rales.  Abdominal: Soft. Bowel sounds are normal. She exhibits no distension and no mass. There is no tenderness. There is no rebound and no guarding.  Lymphadenopathy:    She has no cervical adenopathy.  Neurological: She is alert and oriented to person, place, and time. No cranial nerve deficit.  Skin: Skin is warm and dry. No rash noted. She is not diaphoretic. No erythema. No pallor.  Psychiatric: Her speech is normal. Thought content normal. Her affect is angry and blunt. She is agitated. Cognition and memory are normal.   No results found. Depression screen  [facility] 2/9 10/20/2017 04/28/2017 02/08/2017 12/01/2016 09/12/2016  Decreased Interest 0 0 0 0 0  Down, Depressed, Hopeless 0 0 0 0 0  PHQ - 2 Score 0 0 0 0 0  Altered sleeping - - - - -  Tired, decreased energy - - - - -  Change in appetite - - - - -  Feeling bad or failure about yourself  - - - - -  Trouble concentrating - - - - -  Moving slowly or fidgety/restless - - - - -  Suicidal thoughts - - - - -    PHQ-9 Score - - - - -  Difficult doing work/chores - - - - -  Some recent data might be hidden   Fall Risk  10/20/2017 04/28/2017 02/08/2017 12/01/2016 09/12/2016  Falls in the past year? No No No No Yes  Number falls in past yr: - - - - 1  Comment - - - - -  Injury with Fall? - - - - No  Comment - - - - -  Follow up - - - - -        Assessment & Plan:   1. Essential hypertension   2. Chronic systolic CHF (congestive heart failure) (HCC)   3. COPD, severe (HCC)   4. Generalized anxiety disorder   5. Pure hypercholesterolemia   6. Atrial fibrillation with RVR (HCC)   7. Rapid atrial fibrillation (HCC)   8. Chronic respiratory failure with hypoxia (HCC)   9. Drug-induced constipation   10. Colon cancer screening   11. Estrogen deficiency   12. Encounter for screening mammogram for breast cancer    Controlled hypertension.  Obtain labs for chronic disease management.  No changes in therapy.  Noncompliant with statin therapy due to intolerance of medications.  Patient also refusing injectable options.  Followed closely by cardiology for coronary artery disease, congestive heart failure, peripheral artery disease.  Maintained on Coumadin therapy due to chronic atrial fibrillation as well.  Recent exacerbation of constipation which is drug-induced from chronic opiate therapy.  Recommend daily to twice daily MiraLAX therapy.  Also recommend Senokot-S 1-2 tablets twice daily as needed.  Patient can also use a glycerin suppository and/or fleets enema for acute issues.  Patient agreeable to color guard testing for colon cancer screening.  Refer for mammography and bone density screening.   Orders Placed This Encounter  Procedures  . DG Bone Density    Standing Status:   Future    Standing Expiration Date:   12/21/2018    Scheduling Instructions:     [facility]    Order Specific Question:   Reason for Exam (SYMPTOM  OR DIAGNOSIS REQUIRED)    Answer:   estrogen deficiency    Order  Specific Question:   Preferred imaging location?    Answer:   External  . MM Digital Screening    Standing Status:   Future    Standing Expiration Date:   12/21/2018    Scheduling Instructions:     [facility]    Order Specific Question:   Reason for Exam (SYMPTOM  OR DIAGNOSIS REQUIRED)    Answer:   annual screening    Order Specific Question:   Preferred imaging location?    Answer:   External  . Lipid panel    Order Specific Question:   Has the patient fasted?    Answer:   No  . Comprehensive metabolic panel    Order Specific Question:   Has the patient fasted?  Answer:   No  . CBC with Differential/Platelet  . Cologuard   No orders of the defined types were placed in this encounter.   Return in about 6 months (around 04/20/2018) for complete physical examiniation.   Keyler Hoge Paulita Fujita, M.D. Primary Care at [facility] previously [facility] [address] [place], [place]  [address] [phone] phone [phone] fax

## 2017-10-21 LAB — COMPREHENSIVE METABOLIC PANEL
A/G RATIO: 2.3 — AB (ref 1.2–2.2)
ALT: 14 IU/L (ref 0–32)
AST: 19 IU/L (ref 0–40)
Albumin: 4.6 g/dL (ref 3.5–4.8)
Alkaline Phosphatase: 89 IU/L (ref 39–117)
BUN/Creatinine Ratio: 13 (ref 12–28)
BUN: 13 mg/dL (ref 8–27)
Bilirubin Total: 0.3 mg/dL (ref 0.0–1.2)
CALCIUM: 9.6 mg/dL (ref 8.7–10.3)
CO2: 24 mmol/L (ref 20–29)
CREATININE: 0.97 mg/dL (ref 0.57–1.00)
Chloride: 96 mmol/L (ref 96–106)
GFR calc non Af Amer: 59 mL/min/{1.73_m2} — ABNORMAL LOW (ref >59)
GFR, EST AFRICAN AMERICAN: 68 mL/min/{1.73_m2} (ref >59)
GLOBULIN, TOTAL: 2 g/dL (ref 1.5–4.5)
Glucose: 129 mg/dL — ABNORMAL HIGH (ref 65–99)
POTASSIUM: 4.6 mmol/L (ref 3.5–5.2)
SODIUM: 137 mmol/L (ref 134–144)
TOTAL PROTEIN: 6.6 g/dL (ref 6.0–8.5)

## 2017-10-21 LAB — LIPID PANEL
CHOLESTEROL TOTAL: 166 mg/dL (ref 100–199)
Chol/HDL Ratio: 4.6 ratio — ABNORMAL HIGH (ref 0.0–4.4)
HDL: 36 mg/dL — ABNORMAL LOW (ref >39)
LDL Calculated: 99 mg/dL (ref 0–99)
TRIGLYCERIDES: 156 mg/dL — AB (ref 0–149)
VLDL Cholesterol Cal: 31 mg/dL (ref 5–40)

## 2017-10-21 LAB — CBC WITH DIFFERENTIAL/PLATELET
BASOS: 0 %
Basophils Absolute: 0 10*3/uL (ref 0.0–0.2)
EOS (ABSOLUTE): 0.1 10*3/uL (ref 0.0–0.4)
EOS: 2 %
HEMATOCRIT: 37.2 % (ref 34.0–46.6)
HEMOGLOBIN: 12.3 g/dL (ref 11.1–15.9)
IMMATURE GRANS (ABS): 0 10*3/uL (ref 0.0–0.1)
IMMATURE GRANULOCYTES: 0 %
LYMPHS: 20 %
Lymphocytes Absolute: 1.1 10*3/uL (ref 0.7–3.1)
MCH: 29.3 pg (ref 26.6–33.0)
MCHC: 33.1 g/dL (ref 31.5–35.7)
MCV: 89 fL (ref 79–97)
Monocytes Absolute: 0.6 10*3/uL (ref 0.1–0.9)
Monocytes: 12 %
NEUTROS ABS: 3.5 10*3/uL (ref 1.4–7.0)
Neutrophils: 66 %
PLATELETS: 255 10*3/uL (ref 150–379)
RBC: 4.2 x10E6/uL (ref 3.77–5.28)
RDW: 14.7 % (ref 12.3–15.4)
WBC: 5.3 10*3/uL (ref 3.4–10.8)

## 2017-10-28 IMAGING — CR DG CHEST 2V
2 series · 2 of 2 positions shown · non-contrast
Comparison: 09/19/2014; chest CT 01/21/2016

CLINICAL DATA: Midline to RIGHT side chest pain with coughing and
deep inspiration, productive cough, shortness of breath this
morning, hypertension, former smoker, COPD

EXAM:
CHEST  2 VIEW

[chest lat]
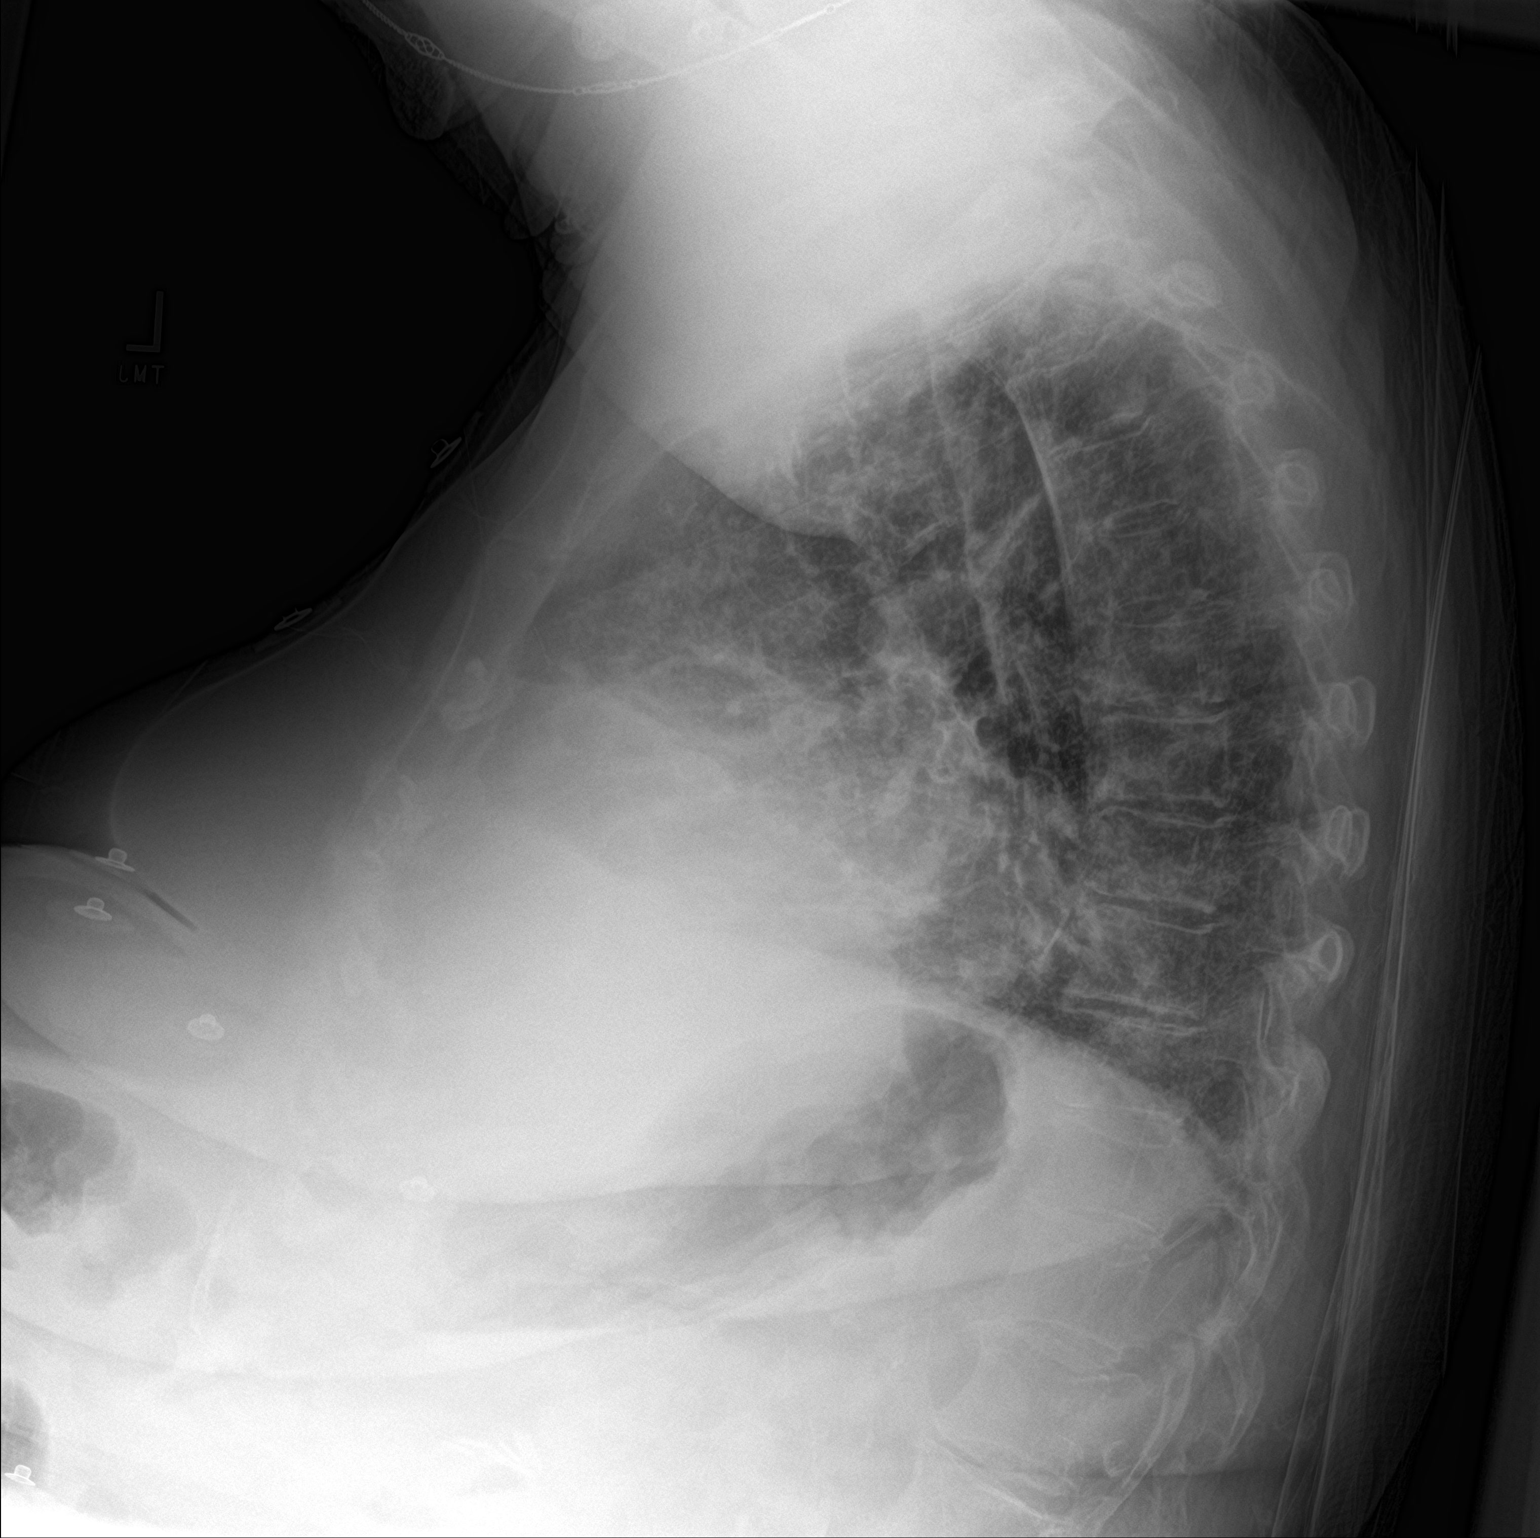

[chest ap]
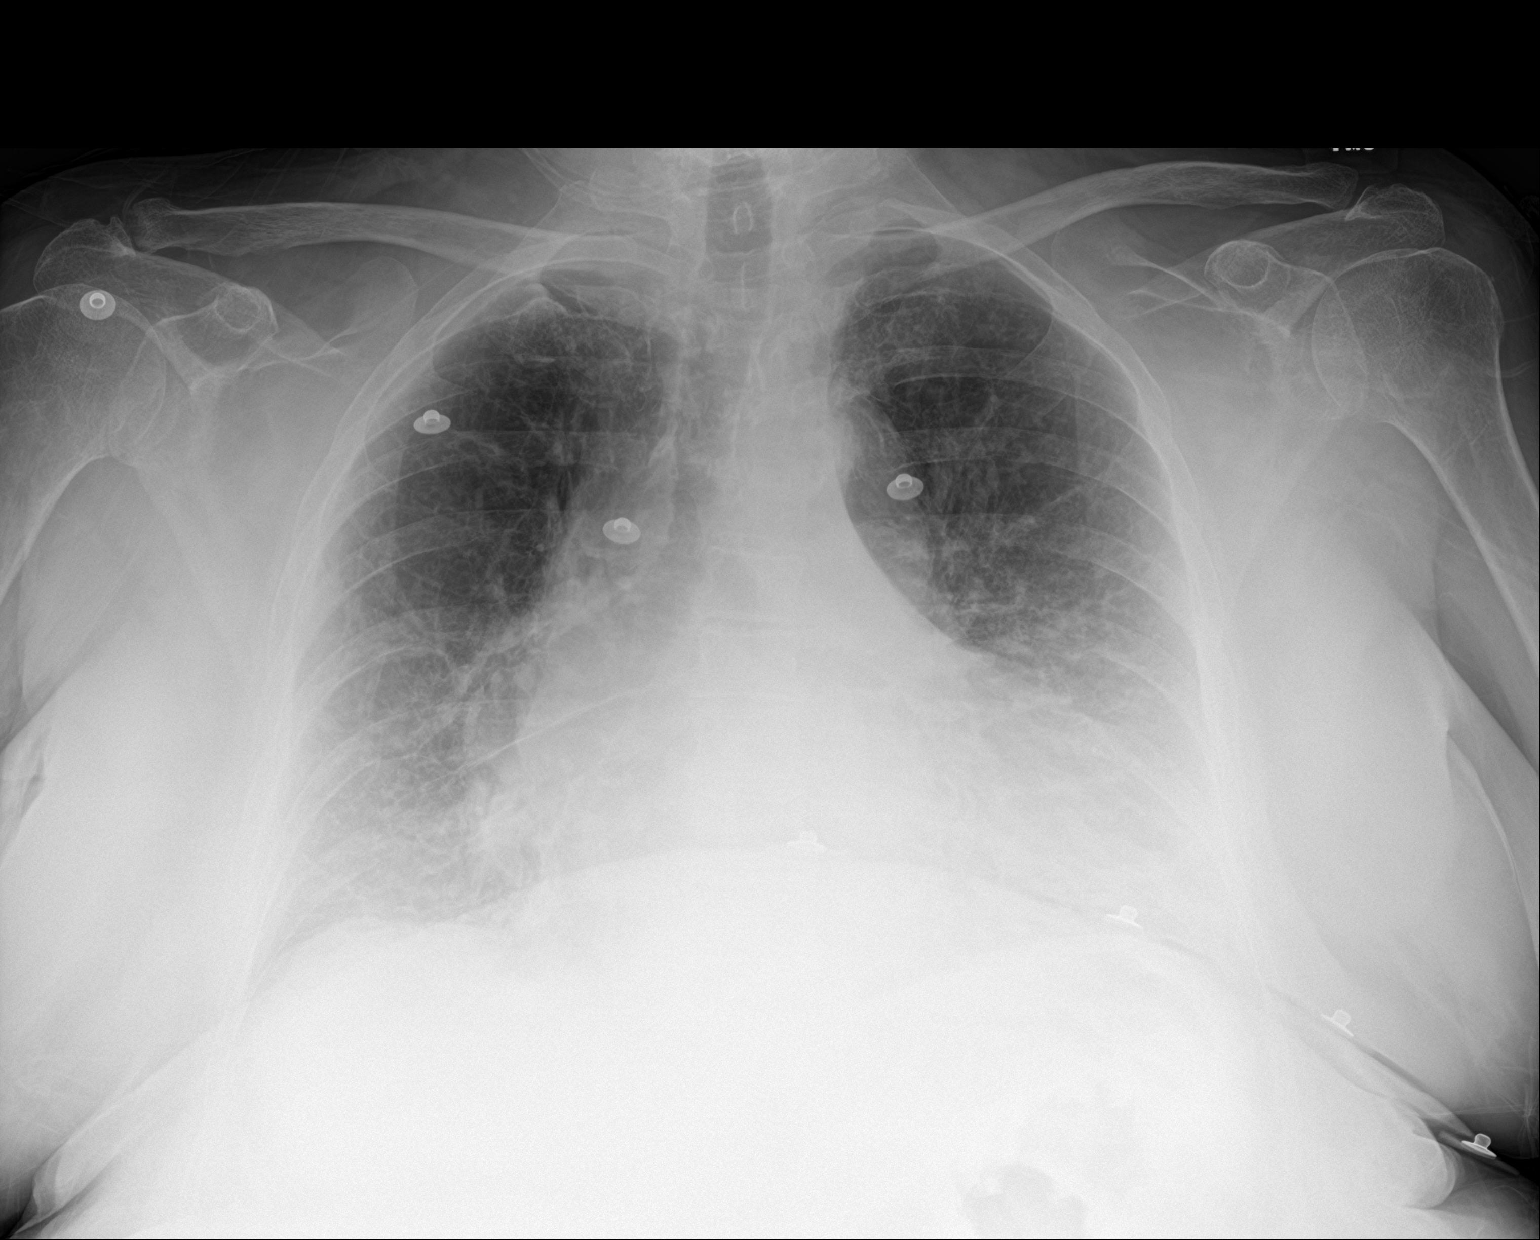

[2 of 2 positions shown; findings below may reference images not displayed]

FINDINGS: Enlargement of cardiac silhouette with pulmonary vascular
congestion.

Atherosclerotic calcification mild tortuosity of thoracic aorta.

Chronic accentuation of interstitial markings grossly unchanged.

Bibasilar atelectasis.

No definite acute infiltrate, pleural effusion or pneumothorax
identified when accounting for differences in technique.

Diffuse osseous demineralization.
IMPRESSION: Chronic accentuation of interstitial markings with bibasilar
atelectasis.

Enlargement of cardiac silhouette with pulmonary vascular
congestion.

## 2017-10-28 NOTE — Telephone Encounter (Signed)
Copied from [place]. Topic: Quick Communication - See Telephone Encounter >> Oct 28, 2017 11:26 AM Hewitt Shorts wrote: CRM for notification. See Telephone encounter for: pt is needing a refill on miralax  [facility] [address] [phone] best number for patient is [phone]   10/28/17.

## 2017-10-28 NOTE — Telephone Encounter (Signed)
Is this refill for  Miralax for Laurie Horton over the counter? Was trying to refill for her but did not see it as a regular prescription.

## 2017-10-28 NOTE — Telephone Encounter (Signed)
Yes, Rx is over the counter please informed pt.

## 2017-10-30 ENCOUNTER — Other Ambulatory Visit: Payer: Self-pay | Admitting: Family Medicine

## 2017-11-02 IMAGING — CT CT ABD-PELV W/O CM
2 of 4 series · 15 of 46 positions shown, 17 images · non-contrast
Comparison: 03/29/2013 CT abdomen/pelvis.

CLINICAL DATA: Lower abdominal pain for over 1 week.

EXAM:
CT ABDOMEN AND PELVIS WITHOUT CONTRAST
TECHNIQUE: Multidetector CT imaging of the abdomen and pelvis was performed
following the standard protocol without IV contrast.

[Series 2: a/p w/o 5mm · axial · non-contrast · 0.78mm/px · z∈[+912,+1327]mm · 12 of 95 slices shown, 14 images]
[im 8/95  soft-tissue]
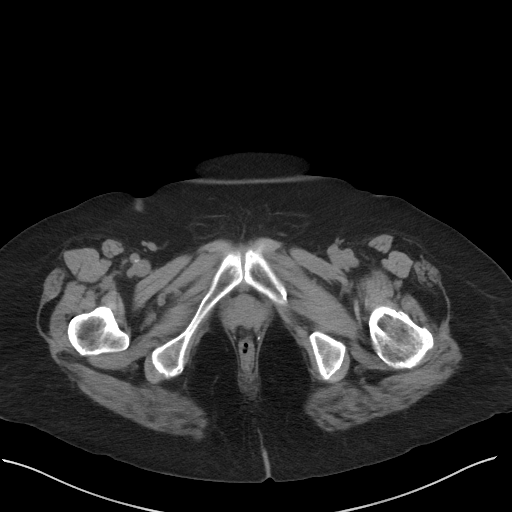
[im 8/95  bone]
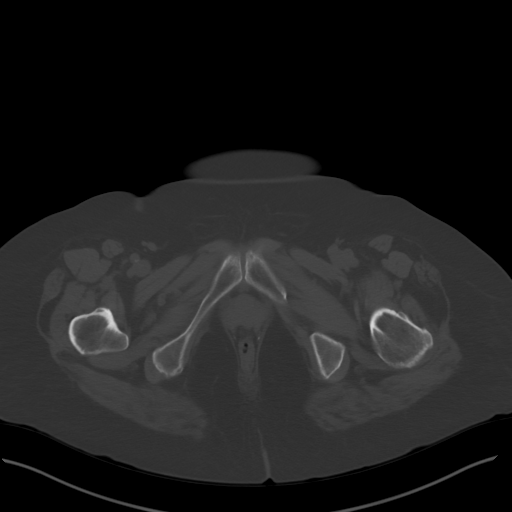
[im 16/95  soft-tissue]
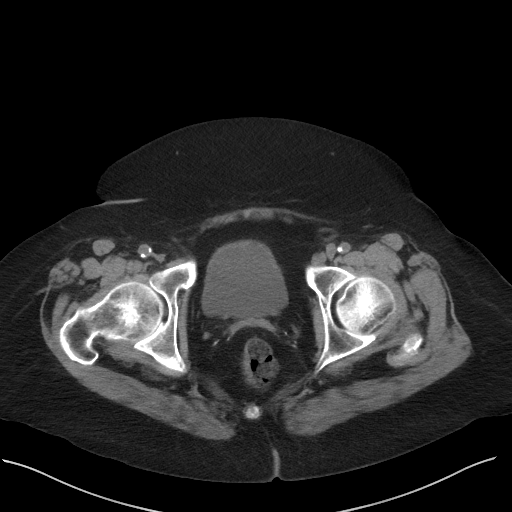
[im 23/95  soft-tissue]
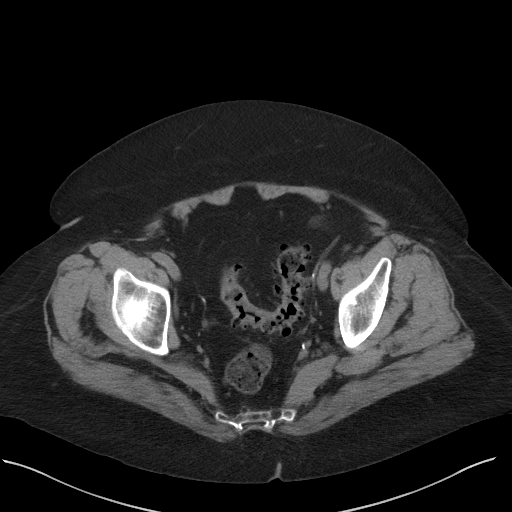
[im 31/95  soft-tissue]
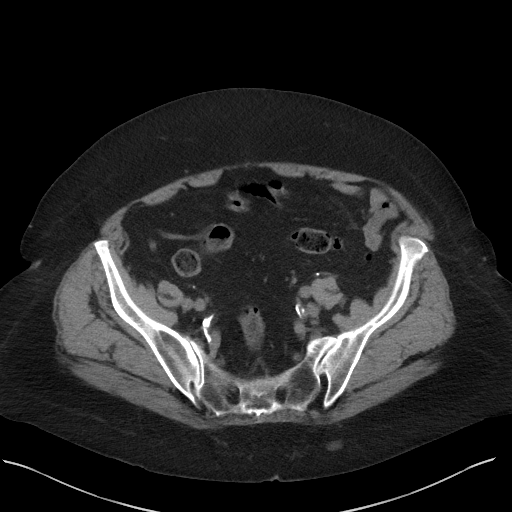
[im 38/95  soft-tissue]
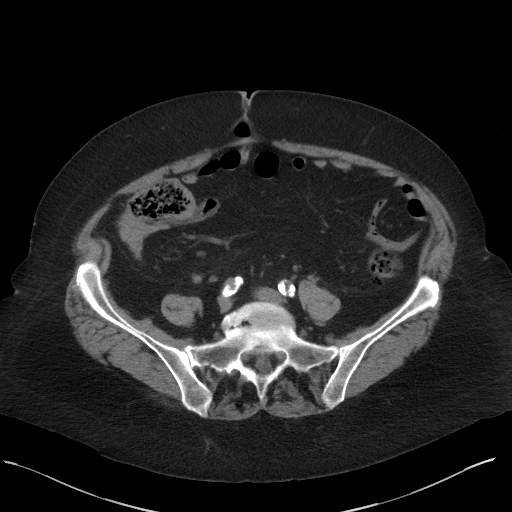
[im 46/95  soft-tissue]
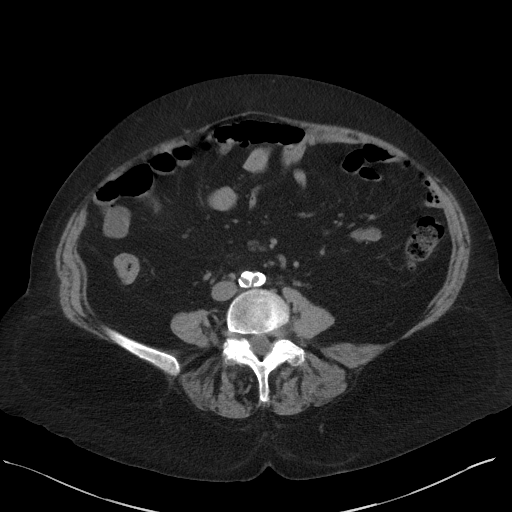
[im 53/95  soft-tissue]
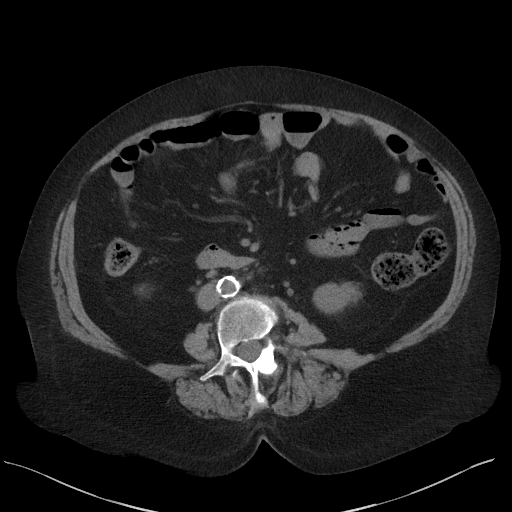
[im 61/95  soft-tissue]
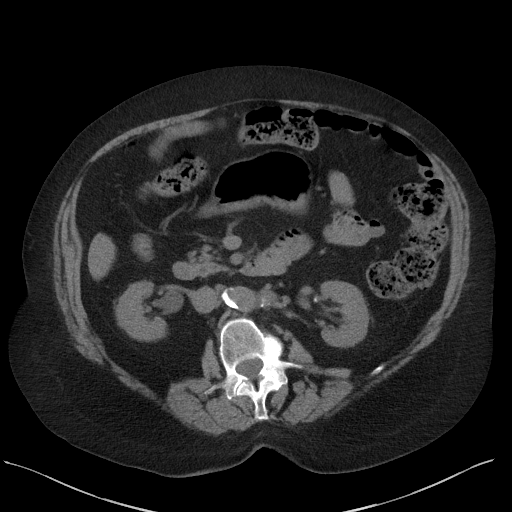
[im 68/95  soft-tissue]
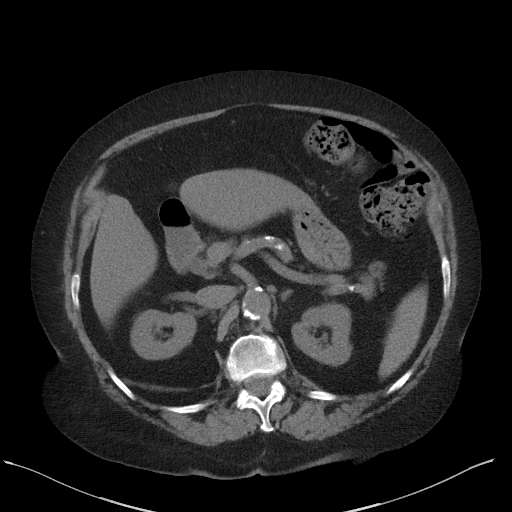
[im 68/95  bone]
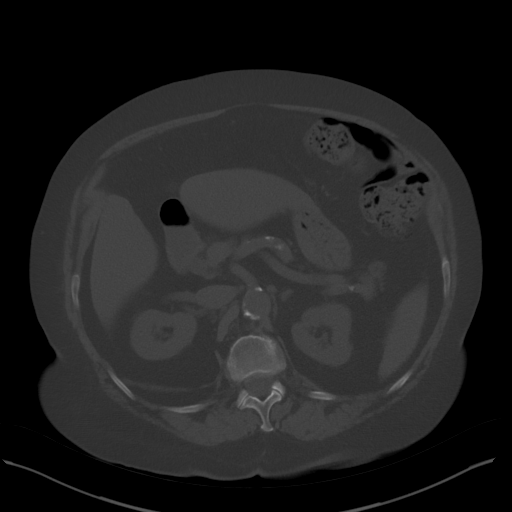
[im 76/95  soft-tissue]
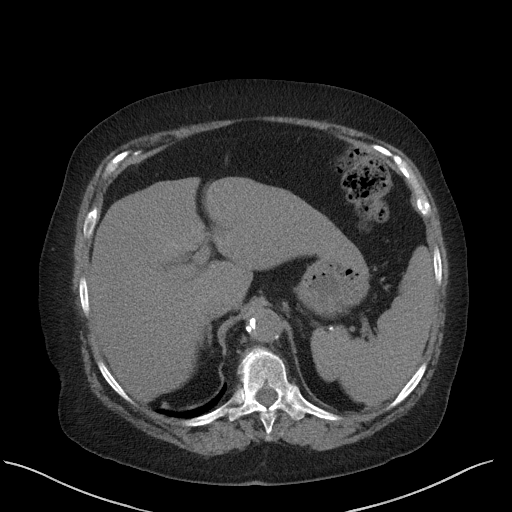
[im 83/95  soft-tissue]
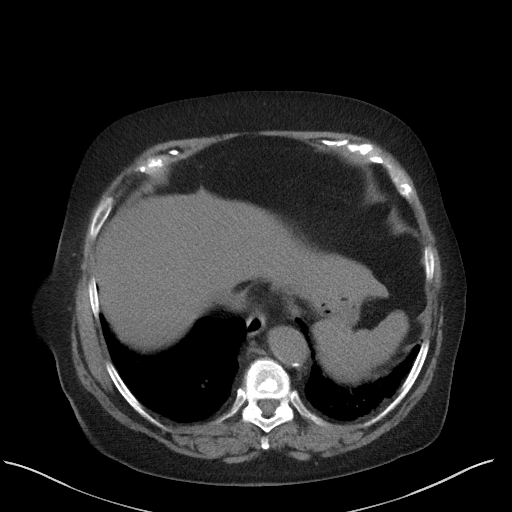
[im 91/95  soft-tissue]
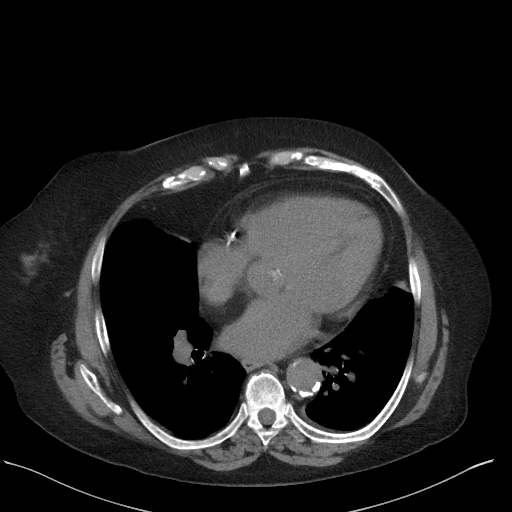

[Series 5: a/p w/o cor · coronal · non-contrast · 0.69mm/px · 3 of 151 slices shown]
[im 51/151  soft-tissue]
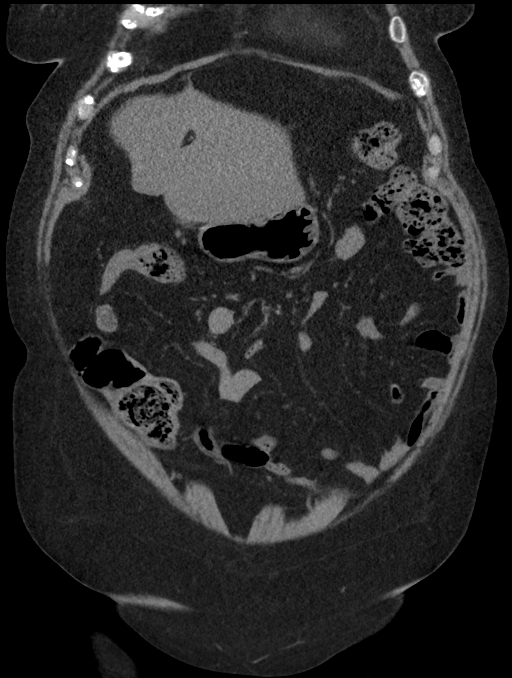
[im 67/151  soft-tissue]
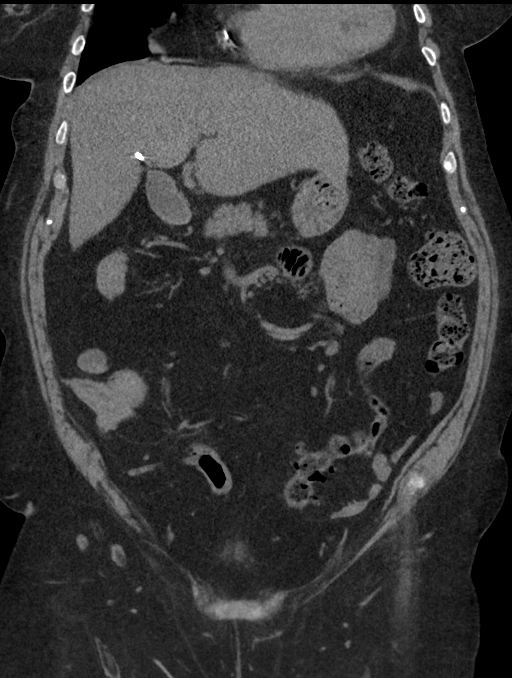
[im 84/151  soft-tissue]
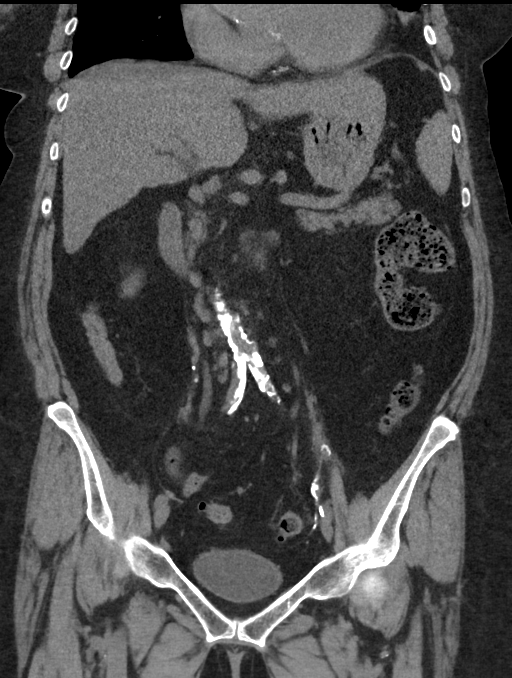

[15 of 46 positions shown; findings below may reference images not displayed]

FINDINGS: Lower chest: Moderate centrilobular emphysema at the lung bases.
Cardiomegaly. Coronary atherosclerosis.

Hepatobiliary: Mild diffuse hepatic steatosis. No liver mass. Liver
surface appears slightly irregular, cannot exclude cirrhosis.
Cholecystectomy. Bile ducts are within expected post cholecystectomy
limits with common bile duct diameter 8 mm.

Pancreas: Normal, with no mass or duct dilation.

Spleen: Normal size. No mass.

Adrenals/Urinary Tract: Normal adrenals. No renal stones. No
hydronephrosis. Partially exophytic 1.2 cm renal cyst in the lateral
interpolar right kidney. No additional contour deforming renal
masses. Normal caliber ureters, with no ureteral stones. No bladder
stones or bladder wall thickening.

Stomach/Bowel: Grossly normal stomach. Normal caliber small bowel
with no small bowel wall thickening. Normal appendix. Moderate
sigmoid diverticulosis, with no large bowel wall thickening or
pericolonic fat stranding.

Vascular/Lymphatic: Atherosclerotic nonaneurysmal abdominal aorta.
No pathologically enlarged lymph nodes in the abdomen or pelvis.

Reproductive: Status post hysterectomy, with no abnormal findings at
the vaginal cuff. No adnexal mass.

Other: No pneumoperitoneum, ascites or focal fluid collection.
Stable small fat containing umbilical hernia. Stable focal diastasis
of the umbilical ventral abdominal wall.

Musculoskeletal: No aggressive appearing focal osseous lesions.
Moderate thoracolumbar spondylosis.
IMPRESSION: 1. No acute abnormality. No evidence of bowel obstruction or acute
bowel inflammation. Normal appendix. Moderate sigmoid
diverticulosis, with no evidence of acute diverticulitis.
2. Stable small fat containing umbilical hernia and concomitant mild
focal diastasis of the umbilical ventral abdominal wall.
3. Mild diffuse hepatic steatosis. Question irregular liver surface,
cannot exclude cirrhosis. Consider hepatic elastography for further
liver fibrosis risk stratification, as clinically warranted.
4. Aortic atherosclerosis.  Coronary atherosclerosis.
5. Moderate emphysema at the lung bases.

## 2017-11-02 IMAGING — CR DG CHEST 2V
2 series · 2 of 2 positions shown · non-contrast
Comparison: 09/01/2016

CLINICAL DATA: Cough and nausea for 6 days

EXAM:
CHEST  2 VIEW

[chest pa]
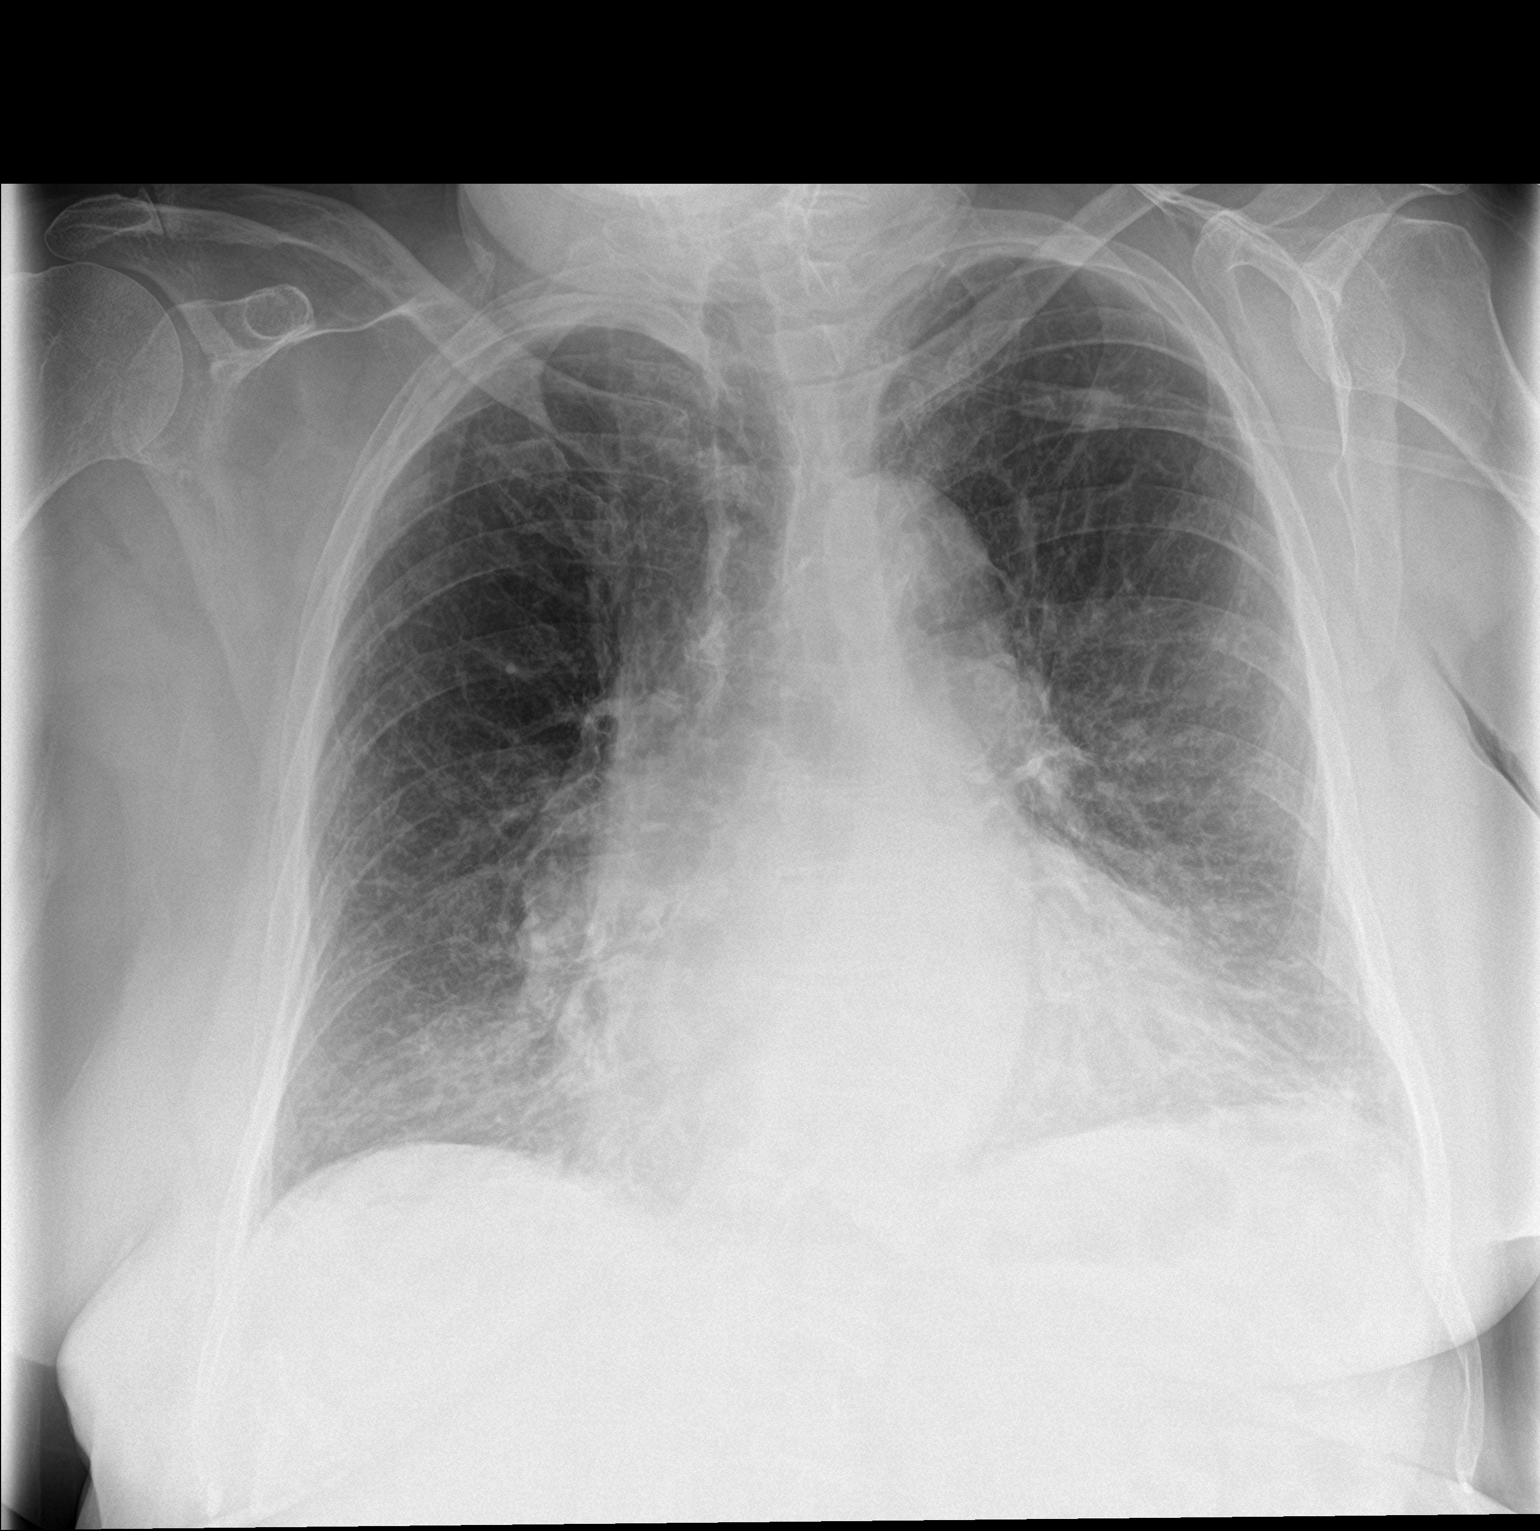

[chest lat]
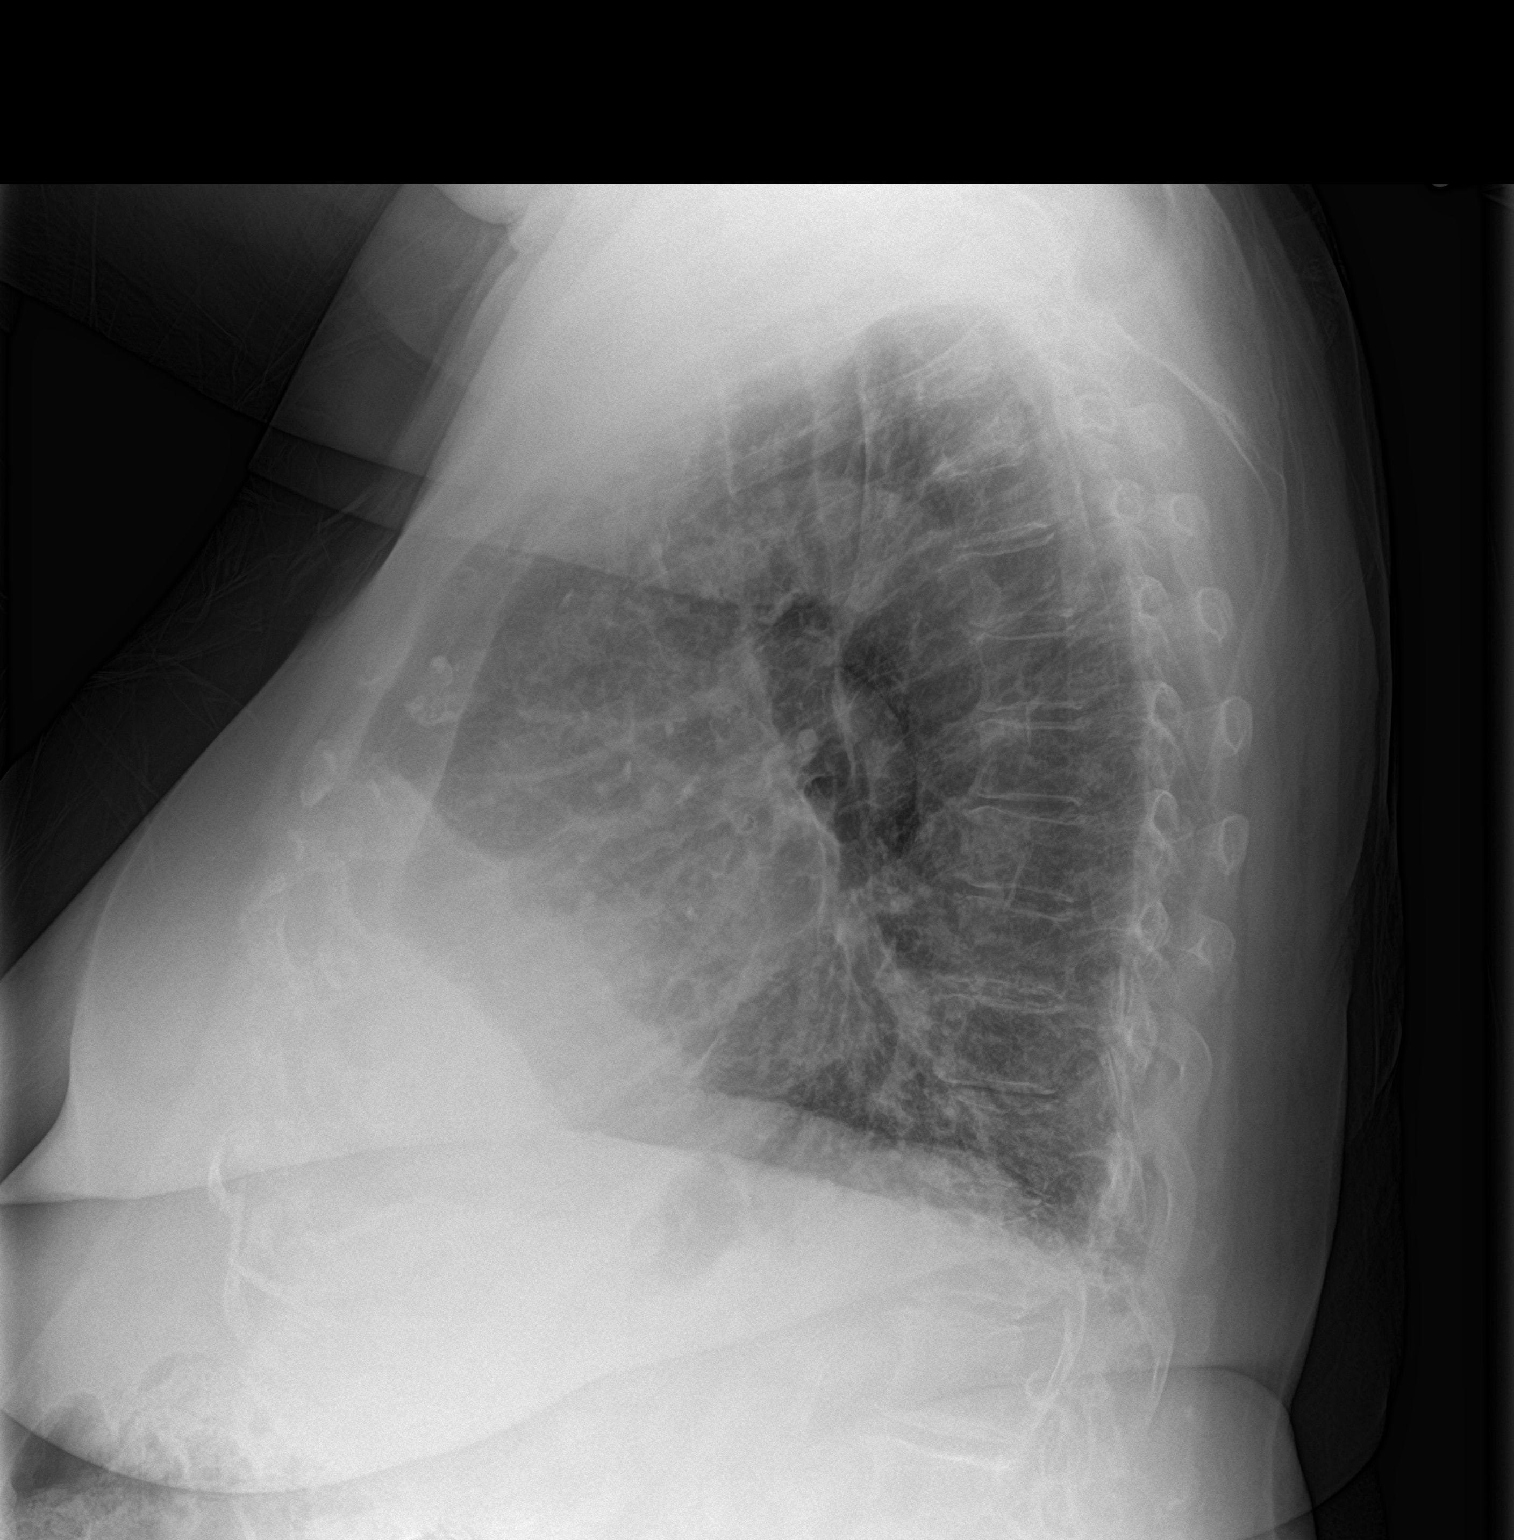

[2 of 2 positions shown; findings below may reference images not displayed]

FINDINGS: Cardiac shadow remains enlarged. Vascular congestion with
interstitial edema is again noted. No sizable effusion is seen. The
lungs are hyperinflated consistent with COPD. No acute bony
abnormality is seen.
IMPRESSION: Changes consistent with CHF.

## 2017-11-03 ENCOUNTER — Ambulatory Visit (INDEPENDENT_AMBULATORY_CARE_PROVIDER_SITE_OTHER): Payer: Medicare Other | Admitting: Pharmacist

## 2017-11-03 DIAGNOSIS — I480 Paroxysmal atrial fibrillation: Secondary | ICD-10-CM

## 2017-11-03 DIAGNOSIS — Z7901 Long term (current) use of anticoagulants: Secondary | ICD-10-CM | POA: Diagnosis not present

## 2017-11-03 LAB — POCT INR: INR: 2.1

## 2017-11-10 ENCOUNTER — Ambulatory Visit (INDEPENDENT_AMBULATORY_CARE_PROVIDER_SITE_OTHER): Payer: Medicare Other | Admitting: Cardiovascular Disease

## 2017-11-10 ENCOUNTER — Encounter: Payer: Self-pay | Admitting: Cardiovascular Disease

## 2017-11-10 DIAGNOSIS — I739 Peripheral vascular disease, unspecified: Secondary | ICD-10-CM | POA: Diagnosis not present

## 2017-11-10 NOTE — Progress Notes (Signed)
11/10/2017 Laurie Horton   01-28-1946  [id number]  Primary Physician Wardell Honour, MD Primary Cardiologist: Lorretta Harp MD Garret Reddish, [place], [place]  HPI:  Laurie Horton is a 72 y.o.  divorced Caucasian female mother of 44, grandmother of 79 grandchildren referred to me by Dr. Martinique for peripheral vascular evaluation because of limiting claudication. I last saw her in the office 08/06/17. She does have a history of CAD status post right left heart cath performed 03/04/17 revealing severe 2 vessel obstructive CAD with occluded RCA and circumflex marginal disease. She did have Takatsubo . cardiomyopathy as well as mild pulmonary hypertension. She has a history of hypertension and hyperlipidemia. She also has fibromyalgia. She complains of left greater than right lower extremity claudication which is lifestyle limiting at less than a block. Lower extremity arterial Dopplers performed 07/28/17 revealed a right ABI 0.9 and a left of .82.  I perform abdominal aortography with bifemoral runoff 09/09/17 revealing an 80% calcified mid infrarenal abdominal aortic stenosis on a bend with a 25 mm gradient, 80% calcified left common iliac artery stenosis and what appeared to be a high-grade calcified right common femoral artery stenosis. She did see Dr. Trula Slade for consideration of aortobifemoral bypass grafting although I suspect she may be a high-risk surgical candidate because of her pulmonary insufficiency.      Current Meds  Medication Sig  . carvedilol (COREG) 12.5 MG tablet Take 1 tablet (12.5 mg total) by mouth 2 (two) times daily.  Marland Kitchen desvenlafaxine (PRISTIQ) 50 MG 24 hr tablet Take 1 tablet (50 mg total) by mouth daily.  . diazepam (VALIUM) 2 MG tablet Take 1 tablet (2 mg total) by mouth daily as needed for anxiety.  . furosemide (LASIX) 40 MG tablet Take 1 tablet (40 mg total) by mouth daily.  Marland Kitchen gabapentin (NEURONTIN) 100 MG capsule Take 100-200 mg by mouth 3 (three) times daily. 100 mg  in am, 200 mg at noon and  200 mg at bedtime  . HYDROcodone-acetaminophen (NORCO/VICODIN) 5-325 MG tablet Take 1 tablet by mouth 3 (three) times daily.   Marland Kitchen ipratropium (ATROVENT) 0.02 % nebulizer solution USE 1 VIAL BY NEBULIZATION 4 (FOUR) TIMES DAILY. (Patient taking differently: USE 1 VIAL BY NEBULIZATION  3 TIMES DAILY AS NEEDED FOR SHORTNESS OF BREATH)  . losartan (COZAAR) 25 MG tablet Take 1 tablet (25 mg total) by mouth daily.  . montelukast (SINGULAIR) 10 MG tablet TAKE 1 TABLET BY MOUTH EVERYDAY AT BEDTIME  . ondansetron (ZOFRAN ODT) 8 MG disintegrating tablet Take 1 tablet (8 mg total) by mouth every 8 (eight) hours as needed for nausea or vomiting.  Marland Kitchen oxybutynin (DITROPAN-XL) 10 MG 24 hr tablet TAKE 1 TABLET BY MOUTH EVERY DAY  . pantoprazole (PROTONIX) 40 MG tablet Take 1 tablet by mouth daily.   . polyethylene glycol (MIRALAX / GLYCOLAX) packet Take 17 g by mouth daily as needed for mild constipation. TAKES TEASPOONFUL IN 8 OZ OF WATER  . rosuvastatin (CRESTOR) 5 MG tablet Take 1 tablet (5 mg total) by mouth once a week. Sunday  . SENNA PO Take 2 capsules by mouth daily.  Marland Kitchen spironolactone (ALDACTONE) 25 MG tablet Take 1 tablet (25 mg total) by mouth daily.  Marland Kitchen tiZANidine (ZANAFLEX) 2 MG tablet Take 2 mg by mouth 3 (three) times daily as needed for muscle spasms. Takes a 1/2-1 tablet depending on pain level  . traZODone (DESYREL) 100 MG tablet Take 1 tablet (100 mg total) by mouth  at bedtime.  . triamcinolone ointment (KENALOG) 0.1 % Apply 1 application topically 3 (three) times daily as needed (EAR IRRITATION).   Marland Kitchen warfarin (COUMADIN) 5 MG tablet TAKE 1/2 TO 1 TABLET BY MOUTH EVERY DAY OR AS DIRECTED BY COUMADIN CLINIC     Allergies  Allergen Reactions  . Alprazolam Other (See Comments)    REACTION: stops breathing  . Bee Venom Anaphylaxis  . Iodine Anaphylaxis and Swelling    REACTION: swelling in throat  . Pseudoephedrine Hcl Er Shortness Of Breath  . Budesonide-Formoterol  Fumarate Other (See Comments)    Blisters inside of mouth all over  . Crestor [Rosuvastatin Calcium] Other (See Comments)    Unable to walk  . Esomeprazole Magnesium Other (See Comments)    REACTION: "bouncing off walls"  . Flonase [Fluticasone Propionate] Other (See Comments)    NOSE BLEED  . Loratadine Other (See Comments)    claritin D causes shaking  . Lotrimin [Clotrimazole] Other (See Comments)    Mouth blisters  . Lunesta [Eszopiclone] Other (See Comments)    REACTION: "slept for a week"  . Oxcarbazepine Other (See Comments)    Causes deep sleep and dizziness  . Statins Other (See Comments)    Can't walk, legs won't work   . Zolpidem Tartrate Other (See Comments)    REACTION: "slept for a week"  . Clarithromycin Other (See Comments)    All "mycins" Puts into "a" fib Will take if has to for severe sinus infection  . Effexor [Venlafaxine] Nausea And Vomiting    cramps  . Lexapro [Escitalopram Oxalate] Other (See Comments)    hallucinations  . Aciphex [Rabeprazole Sodium] Rash  . Avelox [Moxifloxacin Hcl In Nacl] Other (See Comments)    Stomach cramps.   Darlin Coco [Valdecoxib] Rash  . Ceclor [Cefaclor] Rash  . Cephalexin Rash    Pt states that she is possibly allergic to this - had a reaction to Cefaclor in the past and she does not want to these class drugs. Added per patient request.  . Covera-Hs [Verapamil Hcl] Palpitations  . Dicyclomine Hcl Rash  . Other Other (See Comments)    Glue from ekg/heart monitor leads --rash +  Any MYCINS  . Tessalon Perles Rash    Social History   Socioeconomic History  . Marital status: Divorced    Spouse name: Not on file  . Number of children: 2  . Years of education: Not on file  . Highest education level: Not on file  Social Needs  . Financial resource strain: Not on file  . Food insecurity - worry: Not on file  . Food insecurity - inability: Not on file  . Transportation needs - medical: Not on file  . Transportation  needs - non-medical: Not on file  Occupational History  . Occupation: disability  Tobacco Use  . Smoking status: Former Smoker    Last attempt to quit: 06/02/2010    Years since quitting: 7.4  . Smokeless tobacco: Never Used  Substance and Sexual Activity  . Alcohol use: No    Alcohol/week: 0.0 oz  . Drug use: No  . Sexual activity: Yes    Partners: Male    Birth control/protection: None  Other Topics Concern  . Not on file  Social History Narrative   Marital status: divorced in 1978 after ten years; dating casually      Children: 3 biological children; 3 court appointed children; 6 grandchildren; 5 gg      Lives: alone; children  in [place] and [place].      Employment: retired age 72; disability for COPD, CVA at age 71.      Tobacco: former smoker; quit smoking 2011.      Alcohol: none      Exercise: walks dog four times per day; goes to pool three times per week.       ADLs: drives; no assistant devices; does have a walker.  Cleaning is limited in 2018.  Daughter helps with cleaning.  Does own grocery shopping.      Advanced Directives: YES; DNR/DNI; HCPOA:            Review of Systems: General: negative for chills, fever, night sweats or weight changes.  Cardiovascular: negative for chest pain, dyspnea on exertion, edema, orthopnea, palpitations, paroxysmal nocturnal dyspnea or shortness of breath Dermatological: negative for rash Respiratory: negative for cough or wheezing Urologic: negative for hematuria Abdominal: negative for nausea, vomiting, diarrhea, bright red blood per rectum, melena, or hematemesis Neurologic: negative for visual changes, syncope, or dizziness All other systems reviewed and are otherwise negative except as noted above.    Blood pressure 103/75, pulse (!) 49, height 5' 3.5" (1.613 m), weight 189 lb (85.7 kg).  General appearance: alert and no distress Neck: no adenopathy, no carotid bruit, no JVD, supple, symmetrical, trachea midline and  thyroid not enlarged, symmetric, no tenderness/mass/nodules Lungs: clear to auscultation bilaterally Heart: regular rate and rhythm, S1, S2 normal, no murmur, click, rub or gallop Extremities: extremities normal, atraumatic, no cyanosis or edema Pulses: 2+ and symmetric Skin: Skin color, texture, turgor normal. No rashes or lesions Neurologic: Alert and oriented X 3, normal strength and tone. Normal symmetric reflexes. Normal coordination and gait  EKG performed today  ASSESSMENT AND PLAN:   Claudication in peripheral vascular disease [facility]) Ms. Tzeng returns for follow-up after her angiogram I performed 09/09/17. She did have an 80% calcified stenosis in the infrarenal abdominal aorta on a bend 25 mm gradient making percutaneous vascular sensation somewhat high risk. She also had 80% calcified left common iliac artery and what appeared to be a significant lesion in the right common femoral artery with three-vessel runoff bilaterally. I did refer her to Dr. Trula Slade who evaluated her for aortobifemoral bypass grafting. She may have issues regarding her pulmonary status which would preclude her from prolonged intubation although from a coronary point of view she would be a moderately increased risk operative candidate because of her occluded RCA and obtuse marginal branch disease. If she is deemed not to be a good operative candidate for aortobifemoral bypass grafting I could conceivably performed orbital rotational atherectomy and stenting of her calcified left common iliac artery.      Lorretta Harp MD FACP,FACC,FAHA, [facility] 11/10/2017 10:48 AM

## 2017-11-10 NOTE — Patient Instructions (Signed)
Medication Instructions: Your physician recommends that you continue on your current medications as directed. Please refer to the Current Medication list given to you today.   Follow-Up: Your physician recommends that you schedule a follow-up appointment in: 3 months with Dr. Berry.  If you need a refill on your cardiac medications before your next appointment, please call your pharmacy.  

## 2017-11-10 NOTE — Assessment & Plan Note (Signed)
Laurie Horton returns for follow-up after her angiogram I performed 09/09/17. She did have an 80% calcified stenosis in the infrarenal abdominal aorta on a bend 25 mm gradient making percutaneous vascular sensation somewhat high risk. She also had 80% calcified left common iliac artery and what appeared to be a significant lesion in the right common femoral artery with three-vessel runoff bilaterally. I did refer her to Dr. Trula Slade who evaluated her for aortobifemoral bypass grafting. She may have issues regarding her pulmonary status which would preclude her from prolonged intubation although from a coronary point of view she would be a moderately increased risk operative candidate because of her occluded RCA and obtuse marginal branch disease. If she is deemed not to be a good operative candidate for aortobifemoral bypass grafting I could conceivably performed orbital rotational atherectomy and stenting of her calcified left common iliac artery.

## 2017-11-11 ENCOUNTER — Ambulatory Visit: Payer: Medicare Other | Admitting: Physician Assistant

## 2017-11-11 NOTE — Progress Notes (Deleted)
Cardiology Office Note    Date:  11/11/2017   ID:  Jaryn, Hocutt 03/27/1946, MRN [id number]  PCP:  Wardell Honour, MD  Cardiologist:  ***   No chief complaint on file.   History of Present Illness:  Laurie Horton is a 72 y.o. female with PMH of chronic CHF,     Past Medical History:  Diagnosis Date  . CHF (congestive heart failure) ([place]) 03/02/2017   EF 25-30% 2018  . Complication of anesthesia    various issues with oxygen  saturations post op  . COPD (chronic obstructive pulmonary disease) ([place])   . Depression   . Diverticulitis   . DVT (deep venous thrombosis) ([place])   . Fibromyalgia   . GERD (gastroesophageal reflux disease)   . Hiatal hernia   . History of deviated nasal septum    left- side  . HTN (hypertension)   . Hyperlipidemia   . Obesity   . On supplemental oxygen therapy    concentrator at night @ 1.5 l/m or when sleeps. O2 Sat niormally 87.  . Osteoporosis   . PAT (paroxysmal atrial tachycardia) ([place])   . PFO (patent foramen ovale)   . PULMONARY NODULE, LEFT LOWER LOBE 10/14/2009   2mm LLL nodule dec 2010. Stable and 74mm in Oct 2012. No further fu  . Right middle lobe pneumonia ([place]) 07/24/2011   First noted at admit 07/10/11. Persists on cxr 07/22/11. Cleared on CT 08/24/11. No further followup  . Stroke ([place])   . TOBACCO ABUSE 06/04/2009    Past Surgical History:  Procedure Laterality Date  . CARDIOVASCULAR STRESS TEST  12/26/2004   EF 74%. NO EVIDENCE OF ISCHEMIA  . ESOPHAGOGASTRODUODENOSCOPY (EGD) WITH PROPOFOL N/A 04/15/2015   Procedure: ESOPHAGOGASTRODUODENOSCOPY (EGD) WITH PROPOFOL;  Surgeon: Laurence Spates, MD;  Location: [facility];  Service: Endoscopy;  Laterality: N/A;  . KNEE ARTHROSCOPY  2000   left  . LAPAROSCOPIC CHOLECYSTECTOMY  04-16-2010   [place]  . LOWER EXTREMITY ANGIOGRAPHY N/A 09/09/2017   Procedure: Lower Extremity Angiography;  Surgeon: Lorretta Harp, MD;  Location: [place] [facility];  Service: Cardiovascular;   Laterality: N/A;  . MOUTH SURGERY     03-26-15 multiple extractions stitches remains  . RIGHT/LEFT HEART CATH AND CORONARY ANGIOGRAPHY N/A 03/04/2017   Procedure: Right/Left Heart Cath and Coronary Angiography;  Surgeon: Peter M Martinique, MD;  Location: [place] [facility];  Service: Cardiovascular;  Laterality: N/A;  . TOTAL ABDOMINAL HYSTERECTOMY     post op needed oxygen was told "she gave them a scare"  . TUBAL LIGATION    . US ECHOCARDIOGRAPHY  11/20/2009   EF 55-60%    Current Medications: Outpatient Medications Prior to Visit  Medication Sig Dispense Refill  . carvedilol (COREG) 12.5 MG tablet Take 1 tablet (12.5 mg total) by mouth 2 (two) times daily. 60 tablet 11  . desvenlafaxine (PRISTIQ) 50 MG 24 hr tablet Take 1 tablet (50 mg total) by mouth daily. 30 tablet 2  . diazepam (VALIUM) 2 MG tablet Take 1 tablet (2 mg total) by mouth daily as needed for anxiety. 10 tablet 0  . furosemide (LASIX) 40 MG tablet Take 1 tablet (40 mg total) by mouth daily. 30 tablet 11  . gabapentin (NEURONTIN) 100 MG capsule Take 100-200 mg by mouth 3 (three) times daily. 100 mg in am, 200 mg at noon and  200 mg at bedtime    . HYDROcodone-acetaminophen (NORCO/VICODIN) 5-325 MG tablet Take 1 tablet by mouth  3 (three) times daily.     Marland Kitchen ipratropium (ATROVENT) 0.02 % nebulizer solution USE 1 VIAL BY NEBULIZATION 4 (FOUR) TIMES DAILY. (Patient taking differently: USE 1 VIAL BY NEBULIZATION  3 TIMES DAILY AS NEEDED FOR SHORTNESS OF BREATH) 75 mL 3  . losartan (COZAAR) 25 MG tablet Take 1 tablet (25 mg total) by mouth daily. 30 tablet 11  . montelukast (SINGULAIR) 10 MG tablet TAKE 1 TABLET BY MOUTH EVERYDAY AT BEDTIME 30 tablet 1  . ondansetron (ZOFRAN ODT) 8 MG disintegrating tablet Take 1 tablet (8 mg total) by mouth every 8 (eight) hours as needed for nausea or vomiting. 20 tablet 0  . oxybutynin (DITROPAN-XL) 10 MG 24 hr tablet TAKE 1 TABLET BY MOUTH EVERY DAY 30 tablet 0  . pantoprazole (PROTONIX) 40 MG tablet  Take 1 tablet by mouth daily.     . polyethylene glycol (MIRALAX / GLYCOLAX) packet Take 17 g by mouth daily as needed for mild constipation. TAKES TEASPOONFUL IN 8 OZ OF WATER    . rosuvastatin (CRESTOR) 5 MG tablet Take 1 tablet (5 mg total) by mouth once a week. Sunday 12 tablet 3  . SENNA PO Take 2 capsules by mouth daily.    Marland Kitchen spironolactone (ALDACTONE) 25 MG tablet Take 1 tablet (25 mg total) by mouth daily. 30 tablet 11  . tiZANidine (ZANAFLEX) 2 MG tablet Take 2 mg by mouth 3 (three) times daily as needed for muscle spasms. Takes a 1/2-1 tablet depending on pain level    . traZODone (DESYREL) 100 MG tablet Take 1 tablet (100 mg total) by mouth at bedtime. 90 tablet 0  . triamcinolone ointment (KENALOG) 0.1 % Apply 1 application topically 3 (three) times daily as needed (EAR IRRITATION).     Marland Kitchen warfarin (COUMADIN) 5 MG tablet TAKE 1/2 TO 1 TABLET BY MOUTH EVERY DAY OR AS DIRECTED BY COUMADIN CLINIC 90 tablet 0   No facility-administered medications prior to visit.      Allergies:   Alprazolam; Bee venom; Iodine; Pseudoephedrine hcl er; Budesonide-formoterol fumarate; Crestor [rosuvastatin calcium]; Esomeprazole magnesium; Flonase [fluticasone propionate]; Loratadine; Lotrimin [clotrimazole]; Lunesta [eszopiclone]; Oxcarbazepine; Statins; Zolpidem tartrate; Clarithromycin; Effexor [venlafaxine]; Lexapro [escitalopram oxalate]; Aciphex [rabeprazole sodium]; Avelox [moxifloxacin hcl in nacl]; Bextra [valdecoxib]; Ceclor [cefaclor]; Cephalexin; Covera-hs [verapamil hcl]; Dicyclomine hcl; Other; and Tessalon perles   Social History   Socioeconomic History  . Marital status: Divorced    Spouse name: Not on file  . Number of children: 2  . Years of education: Not on file  . Highest education level: Not on file  Social Needs  . Financial resource strain: Not on file  . Food insecurity - worry: Not on file  . Food insecurity - inability: Not on file  . Transportation needs - medical: Not on  file  . Transportation needs - non-medical: Not on file  Occupational History  . Occupation: disability  Tobacco Use  . Smoking status: Former Smoker    Last attempt to quit: 06/02/2010    Years since quitting: 7.4  . Smokeless tobacco: Never Used  Substance and Sexual Activity  . Alcohol use: No    Alcohol/week: 0.0 oz  . Drug use: No  . Sexual activity: Yes    Partners: Male    Birth control/protection: None  Other Topics Concern  . Not on file  Social History Narrative   Marital status: divorced in 1978 after ten years; dating casually      Children: 3 biological children; 3 court appointed children;  6 grandchildren; 5 gg      Lives: alone; children in [place] and [place].      Employment: retired age 31; disability for COPD, CVA at age 74.      Tobacco: former smoker; quit smoking 2011.      Alcohol: none      Exercise: walks dog four times per day; goes to pool three times per week.       ADLs: drives; no assistant devices; does have a walker.  Cleaning is limited in 2018.  Daughter helps with cleaning.  Does own grocery shopping.      Advanced Directives: YES; DNR/DNI; HCPOA:            Family History:  The patient's ***family history includes Alzheimer's disease in her mother; Dementia in her mother; Diabetes in her mother and sister; Heart attack in her father; Heart attack (age of onset: 19) in her brother; Schizophrenia in her sister; Tremor in her sister.   ROS:   Please see the history of present illness.    ROS All other systems reviewed and are negative.   PHYSICAL EXAM:   VS:  There were no vitals taken for this visit.   GEN: Well nourished, well developed, in no acute distress HEENT: normal Neck: no JVD, carotid bruits, or masses Cardiac: ***RRR; no murmurs, rubs, or gallops,no edema  Respiratory:  clear to auscultation bilaterally, normal work of breathing GI: soft, nontender, nondistended, + BS MS: no deformity or atrophy Skin: warm and dry, no  rash Neuro:  Alert and Oriented x 3, Strength and sensation are intact Psych: euthymic mood, full affect  Wt Readings from Last 3 Encounters:  11/10/17 189 lb (85.7 kg)  10/20/17 180 lb (81.6 kg)  10/18/17 186 lb (84.4 kg)      Studies/Labs Reviewed:   EKG:  EKG is*** ordered today.  The ekg ordered today demonstrates ***  Recent Labs: 02/11/2017: Magnesium 1.8 02/12/2017: B Natriuretic Peptide 523.2 08/31/2017: TSH 1.990 10/20/2017: ALT 14; BUN 13; Creatinine, Ser 0.97; Hemoglobin 12.3; Platelets 255; Potassium 4.6; Sodium 137   Lipid Panel    Component Value Date/Time   CHOL 166 10/20/2017 0846   TRIG 156 (H) 10/20/2017 0846   HDL 36 (L) 10/20/2017 0846   CHOLHDL 4.6 (H) 10/20/2017 0846   CHOLHDL 4.6 07/02/2016 0823   VLDL 25 07/02/2016 0823   LDLCALC 99 10/20/2017 0846    Additional studies/ records that were reviewed today include:  ***    ASSESSMENT:    No diagnosis found.   PLAN:  In order of problems listed above:  1. ***    Medication Adjustments/Labs and Tests Ordered: Current medicines are reviewed at length with the patient today.  Concerns regarding medicines are outlined above.  Medication changes, Labs and Tests ordered today are listed in the Patient Instructions below. There are no Patient Instructions on file for this visit.   Hilbert Corrigan, [place]  11/11/2017 8:10 AM    [address], [place], [place]  [address] Phone: [phone]; Fax: [phone]

## 2017-11-18 ENCOUNTER — Encounter: Payer: Self-pay | Admitting: Family Medicine

## 2017-11-22 ENCOUNTER — Encounter: Payer: Self-pay | Admitting: Internal Medicine

## 2017-11-22 ENCOUNTER — Ambulatory Visit (INDEPENDENT_AMBULATORY_CARE_PROVIDER_SITE_OTHER): Payer: Medicare Other | Admitting: Internal Medicine

## 2017-11-22 VITALS — BP 124/80 | HR 57 | Ht 63.0 in | Wt 189.4 lb

## 2017-11-22 DIAGNOSIS — J438 Other emphysema: Secondary | ICD-10-CM | POA: Diagnosis not present

## 2017-11-22 DIAGNOSIS — Z01811 Encounter for preprocedural respiratory examination: Secondary | ICD-10-CM

## 2017-11-22 DIAGNOSIS — J9611 Chronic respiratory failure with hypoxia: Secondary | ICD-10-CM | POA: Diagnosis not present

## 2017-11-22 NOTE — Patient Instructions (Signed)
Chronic respiratory failure with hypoxia (HCC) Other emphysema (HCC)  -Continue oxygen therapy and nebulizers as before -Respect desire to refuse flu shot  Preop pulmonary/respiratory exam  -I think any surgery that does not involve cutting open the abdomen but also involves high level of anesthesia support and possibly postoperative intensive care monitoring for a day and a procedure that is less than 2 hours should be possible.  Therefore femoral vessel stenting or iliac artery stenting should be possible but not laparotomy -I will discuss with both Dr. [name] and Dr. Trula Slade about this   Follow-up -6 months or sooner if needed

## 2017-11-22 NOTE — Telephone Encounter (Signed)
-----   Message from Peter M Martinique, MD sent at 11/22/2017 12:01 PM EST ----- Regarding: RE: Cardiac clearance  She was seen by Dr. Gwenlyn Found in our office on January 9 and you can refer to his note for cardiac clearance. In brief she is at moderate risk from a cardiac standpoint but may be high risk for pulmonary complications due to her lung disease. May want to have pulmonary assess.  Peter Martinique MD, [facility]  ----- Message ----- From: Willy Eddy, Horton Sent: 11/22/2017  11:34 AM To: Peter M Martinique, MD Subject: Cardiac clearance                              Requesting Cardiac Clearance for Laurie Horton.  I was told  that this patient was to see you 11/03/17 but I do not see any notes. She is seeing pulmonary for clearance 11/22/17. Surgery for Aorto-bifemoral bypass pending. Plan to have IVC filter 1 week prior. I will contact Coumadin Clinic at [facility] unless you are managing her Coumadin? I appreciate your assistance with this patient. Laurie Horton

## 2017-11-22 NOTE — Progress Notes (Signed)
Subjective:     Patient ID: Laurie Horton, female   DOB: [birth year], 72 y.o.   MRN: [id number]  HPI McBride, MD   OV 05/19/2016  Chief Complaint  Patient presents with  . Follow-up    Pt c/o worsening SOB, prod cough with thick white mucus.      This is a routine follow-up. Last visit was in April 2017 with nurse practitioner. At that time treated for COPD exacerbation according to her history but review of the chart does not show that to be true. At this point in time she says COPD stable although she says she might be in flare up on account of her fibromyalgia. Starting her symptoms out it appears that it is generally stable with dyspnea at baseline and cough with mild sputum at baseline. She is more hobbled by her chronic pain and fibromyalgia. Couple months ago apparently a hydrocodone was discontinued by rheumatology and therefore she is having "withdrawal". She does have a new pain medication physician. She is on gabapentin for fibromyalgia. There are no other new issues. She does not want antibiotics or prednisone for her perceived exacerbation  OV 11/17/2016  Chief Complaint  Patient presents with  . Follow-up    Pt states her SOB has worsened since last OV. Pt states she has a burning in her chest when she becomes SOB. Pt c/o prod cough with white mucus in morning, cough becomes nonprod throughout the day.     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 Last CT chest March 2017 without any mass and associated mild cor pulmonale   Six-month follow-up visit. She is completely overwhelmed by her fibromyalgia and depression. The fibromyalgia is worse. She says she is change doctors because of this. She's had a few admissions in the interim but none of them give a COPD exacerbation according to chart review. She suffered frustrated by her heels oxygen system even though it is light it is causing her  pain. She uses Atrovent nebulizer but wants change to something else but at the same time has rejected use of any other nebulizer or oral inhaler because of side effects. She is burning chest pain with inspiration and associated with her costochondral junction trigger points. 2014 review of the chart shows normal cardiac stress test. November 2017 chest x-ray is clear. She does not want any further imaging. Chest pain is mild to severe and variable. Worsened with inspiration. No radiation associated wheezing. No sputum production  OV 05/25/2017  Chief Complaint  Patient presents with  . Follow-up    Pt states her SOB has worsened since last OV in 11/2016. Pt states she only coughs after her neb treatment - pt states her mucus is yellow in color and c/o occ chest discomfort. Pt denies f/c/s.     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 Last CT chest March 2017 without any mass and associated mild cor pulmonale   Follow-up exertional hypoxemia associated with his emphysema. Also associated with fibromyalgia. Last visit she had atypical chest pain. Recommended she see cardiology. 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 According to her history she has significant coronary artery disease but is fairly advanced. She is frustrated with this but realistic. She feels her days are number. In terms of COPD emphysema and is stable.  She is on Atrovent inhalers. She is on oxygen. She wants a lighter system. She still burden by fibromyalgia. She does not want to do any vaccines anymore including flu shot and the new  shingles vaccine   OV 11/22/2017  Chief Complaint  Patient presents with  . 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 Last CT chest March 2017 without any mass and associated mild cor pulmonale   Follow-up emphysema with chronic hypoxemic restorative failure and associated fibromyalgia and associated chronic systolic heart failure: Overall doing well.  She uses oxygen and nebulizers.  She is not interested in rehab or vaccines.  New issue: Preoperative pulmonary evaluation.  She is having significant bilateral lower extremity claudication.  She states that she is in severe pain walking from her door to the mailbox to the point she is almost crying.  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  Apparently Dr. Trula Slade wants to try a laparotomy approach.  However Dr. Broadus John might take her to the cardiac Cath Lab and placed stents.  She says she is sensitive to fentanyl and is worried about anesthesia complications but the pain is so severe she is willing to take the risk.  She has had previous cardiac catheterization without any problems other than being sensitive to fentanyl.  She says this can be done in the cardiac Cath Lab with anesthesia support.  She wants me to talk to Dr. Betsy Coder and Dr. Trula Slade about this.   has a past medical history of CHF (congestive heart failure) ([place]) (38/[id number]), Complication of anesthesia, COPD (chronic obstructive pulmonary disease) ([place]), Depression, Diverticulitis, DVT (deep venous thrombosis) (H[facility]), Fibromyalgia, GERD (gastroesophageal reflux disease), Hiatal hernia, History of deviated nasal septum, HTN (hypertension), Hyperlipidemia, Obesity, On supplemental oxygen therapy, Osteoporosis, PAT (paroxysmal atrial tachycardia) ([place]), PFO (patent foramen ovale), PULMONARY NODULE, LEFT LOWER LOBE (10/14/2009), Right middle lobe pneumonia ([place]) (07/24/2011), Stroke ([place]), and TOBACCO ABUSE (06/04/2009).   reports that she quit smoking about 7 years ago. she has never  used smokeless tobacco.  Past Surgical History:  Procedure Laterality Date  . CARDIOVASCULAR STRESS TEST  12/26/2004   EF 74%. NO EVIDENCE OF ISCHEMIA  . ESOPHAGOGASTRODUODENOSCOPY (EGD) WITH PROPOFOL N/A 04/15/2015   Procedure: ESOPHAGOGASTRODUODENOSCOPY (EGD) WITH PROPOFOL;  Surgeon: Laurence Spates, MD;  Location: [facility];  Service: Endoscopy;  Laterality: N/A;  . KNEE ARTHROSCOPY  2000   left  . LAPAROSCOPIC CHOLECYSTECTOMY  04-16-2010   [place]  . LOWER EXTREMITY ANGIOGRAPHY N/A 09/09/2017   Procedure: Lower Extremity Angiography;  Surgeon: Lorretta Harp, MD;  Location: [facility];  Service: Cardiovascular;  Laterality: N/A;  . MOUTH SURGERY     03-26-15 multiple extractions stitches remains  . RIGHT/LEFT HEART CATH AND CORONARY ANGIOGRAPHY N/A 03/04/2017   Procedure: Right/Left Heart Cath and Coronary Angiography;  Surgeon: Peter M Martinique, MD;  Location: [facility];  Service: Cardiovascular;  Laterality: N/A;  . TOTAL ABDOMINAL HYSTERECTOMY     post op needed oxygen was told "she gave them a scare"  . TUBAL LIGATION    . US ECHOCARDIOGRAPHY  11/20/2009   EF 55-60%    Allergies  Allergen Reactions  . Alprazolam Other (See Comments)    REACTION: stops breathing  . Bee Venom Anaphylaxis  . Iodine Anaphylaxis and Swelling  REACTION: swelling in throat  . Pseudoephedrine Hcl Er Shortness Of Breath  . Budesonide-Formoterol Fumarate Other (See Comments)    Blisters inside of mouth all over  . Crestor [Rosuvastatin Calcium] Other (See Comments)    Unable to walk  . Esomeprazole Magnesium Other (See Comments)    REACTION: "bouncing off walls"  . Flonase [Fluticasone Propionate] Other (See Comments)    NOSE BLEED  . Loratadine Other (See Comments)    claritin D causes shaking  . Lotrimin [Clotrimazole] Other (See Comments)    Mouth blisters  . Lunesta [Eszopiclone] Other (See Comments)    REACTION: "slept for a week"  . Oxcarbazepine Other (See Comments)     Causes deep sleep and dizziness  . Statins Other (See Comments)    Can't walk, legs won't work   . Zolpidem Tartrate Other (See Comments)    REACTION: "slept for a week"  . Clarithromycin Other (See Comments)    All "mycins" Puts into "a" fib Will take if has to for severe sinus infection  . Effexor [Venlafaxine] Nausea And Vomiting    cramps  . Lexapro [Escitalopram Oxalate] Other (See Comments)    hallucinations  . Aciphex [Rabeprazole Sodium] Rash  . Avelox [Moxifloxacin Hcl In Nacl] Other (See Comments)    Stomach cramps.   Darlin Coco [Valdecoxib] Rash  . Ceclor [Cefaclor] Rash  . Cephalexin Rash    Pt states that she is possibly allergic to this - had a reaction to Cefaclor in the past and she does not want to these class drugs. Added per patient request.  . Covera-Hs [Verapamil Hcl] Palpitations  . Dicyclomine Hcl Rash  . Other Other (See Comments)    Glue from ekg/heart monitor leads --rash +  Any MYCINS  . Tessalon Perles Rash    Immunization History  Administered Date(s) Administered  . Influenza Split 08/03/2011, 08/01/2012  . Influenza,inj,Quad PF,6+ Mos 08/14/2013, 09/03/2014, 09/04/2015, 08/18/2016  . Pneumococcal Conjugate-13 10/01/2014  . Pneumococcal Polysaccharide-23 09/03/1999, 06/02/2006, 08/14/2013  . Td 11/02/1994  . Tdap 08/18/2016    Family History  Problem Relation Age of Onset  . Dementia Mother   . Diabetes Mother   . Alzheimer's disease Mother   . Heart attack Brother 36  . Heart attack Father   . Schizophrenia Sister   . Diabetes Sister   . Tremor Sister      Current Outpatient Medications:  .  carvedilol (COREG) 12.5 MG tablet, Take 1 tablet (12.5 mg total) by mouth 2 (two) times daily., Disp: 60 tablet, Rfl: 11 .  desvenlafaxine (PRISTIQ) 50 MG 24 hr tablet, Take 1 tablet (50 mg total) by mouth daily., Disp: 30 tablet, Rfl: 2 .  diazepam (VALIUM) 2 MG tablet, Take 1 tablet (2 mg total) by mouth daily as needed for anxiety., Disp: 10  tablet, Rfl: 0 .  furosemide (LASIX) 40 MG tablet, Take 1 tablet (40 mg total) by mouth daily., Disp: 30 tablet, Rfl: 11 .  gabapentin (NEURONTIN) 100 MG capsule, Take 100-200 mg by mouth 3 (three) times daily. 100 mg in am, 200 mg at noon and  200 mg at bedtime, Disp: , Rfl:  .  HYDROcodone-acetaminophen (NORCO/VICODIN) 5-325 MG tablet, Take 1 tablet by mouth 3 (three) times daily. , Disp: , Rfl:  .  ipratropium (ATROVENT) 0.02 % nebulizer solution, USE 1 VIAL BY NEBULIZATION 4 (FOUR) TIMES DAILY. (Patient taking differently: USE 1 VIAL BY NEBULIZATION  3 TIMES DAILY AS NEEDED FOR SHORTNESS OF BREATH), Disp: 75 mL, Rfl:  3 .  losartan (COZAAR) 25 MG tablet, Take 1 tablet (25 mg total) by mouth daily., Disp: 30 tablet, Rfl: 11 .  montelukast (SINGULAIR) 10 MG tablet, TAKE 1 TABLET BY MOUTH EVERYDAY AT BEDTIME, Disp: 30 tablet, Rfl: 1 .  ondansetron (ZOFRAN ODT) 8 MG disintegrating tablet, Take 1 tablet (8 mg total) by mouth every 8 (eight) hours as needed for nausea or vomiting., Disp: 20 tablet, Rfl: 0 .  oxybutynin (DITROPAN-XL) 10 MG 24 hr tablet, TAKE 1 TABLET BY MOUTH EVERY DAY, Disp: 30 tablet, Rfl: 0 .  pantoprazole (PROTONIX) 40 MG tablet, Take 1 tablet by mouth daily. , Disp: , Rfl:  .  polyethylene glycol (MIRALAX / GLYCOLAX) packet, Take 17 g by mouth daily as needed for mild constipation. TAKES TEASPOONFUL IN 8 OZ OF WATER, Disp: , Rfl:  .  rosuvastatin (CRESTOR) 5 MG tablet, Take 1 tablet (5 mg total) by mouth once a week. Sunday, Disp: 12 tablet, Rfl: 3 .  SENNA PO, Take 2 capsules by mouth daily., Disp: , Rfl:  .  spironolactone (ALDACTONE) 25 MG tablet, Take 1 tablet (25 mg total) by mouth daily., Disp: 30 tablet, Rfl: 11 .  tiZANidine (ZANAFLEX) 2 MG tablet, Take 2 mg by mouth 3 (three) times daily as needed for muscle spasms. Takes a 1/2-1 tablet depending on pain level, Disp: , Rfl:  .  traZODone (DESYREL) 100 MG tablet, Take 1 tablet (100 mg total) by mouth at bedtime., Disp: 90  tablet, Rfl: 0 .  triamcinolone ointment (KENALOG) 0.1 %, Apply 1 application topically 3 (three) times daily as needed (EAR IRRITATION). , Disp: , Rfl:  .  warfarin (COUMADIN) 5 MG tablet, TAKE 1/2 TO 1 TABLET BY MOUTH EVERY DAY OR AS DIRECTED BY COUMADIN CLINIC, Disp: 90 tablet, Rfl: 0   Review of Systems     Objective:   Physical Exam  Constitutional: She is oriented to person, place, and time. She appears well-developed and well-nourished. No distress.  HENT:  Head: Normocephalic and atraumatic.  Right Ear: External ear normal.  Left Ear: External ear normal.  Mouth/Throat: Oropharynx is clear and moist. No oropharyngeal exudate.  o2 on  Eyes: Conjunctivae and EOM are normal. Pupils are equal, round, and reactive to light. Right eye exhibits no discharge. Left eye exhibits no discharge. No scleral icterus.  Neck: Normal range of motion. Neck supple. No JVD present. No tracheal deviation present. No thyromegaly present.  Cardiovascular: Normal rate, regular rhythm, normal heart sounds and intact distal pulses. Exam reveals no gallop and no friction rub.  No murmur heard. Pulmonary/Chest: Effort normal and breath sounds normal. No respiratory distress. She has no wheezes. She has no rales. She exhibits no tenderness.  Abdominal: Soft. Bowel sounds are normal. She exhibits no distension and no mass. There is no tenderness. There is no rebound and no guarding.  Musculoskeletal: Normal range of motion. She exhibits no edema or tenderness.  Lymphadenopathy:    She has no cervical adenopathy.  Neurological: She is alert and oriented to person, place, and time. She has normal reflexes. No cranial nerve deficit. She exhibits normal muscle tone. Coordination normal.  Skin: Skin is warm and dry. No rash noted. She is not diaphoretic. No erythema. No pallor.  Psychiatric: She has a normal mood and affect. Her behavior is normal. Judgment and thought content normal.  Vitals reviewed.   Vitals:    11/22/17 0903  BP: 124/80  Pulse: (!) 57  SpO2: 94%  Weight: 189 lb  6.4 oz (85.9 kg)  Height: 5\' 3"  (1.6 m)        Assessment:       ICD-10-CM   1. Chronic respiratory failure with hypoxia (H[facility]) J96.11   2. Other emphysema ([place]) J43.8   3. Preop pulmonary/respiratory exam Z01.811        Plan:     Chronic respiratory failure with hypoxia ([facility]) Other emphysema ([place])  -Continue oxygen therapy and nebulizers as before -Respect desire to refuse flu shot  Preop pulmonary/respiratory exam  -I think any surgery that does not involve cutting open the abdomen but also involves high level of anesthesia support and possibly postoperative intensive care monitoring for a day and a procedure that is less than 2 hours should be possible.  Therefore femoral vessel stenting or iliac artery stenting should be possible but not laparotomy -I will discuss with both Dr. [name] and Dr. Trula Slade about this   Follow-up -6 months or sooner if needed   > 50% of this > 25 min visit spent in face to face counseling or coordination of care    Dr. Brand Males, M.D., [facility].C.P Pulmonary and Critical Care Medicine Staff Physician, [place] Director - Interstitial Lung Disease  Program  Pulmonary [place] [facility] [place], [place], [address]  Pager: [phone], If no answer or between  15:00h - 7:00h: call [phone] Telephone: [phone]

## 2017-11-25 ENCOUNTER — Ambulatory Visit (INDEPENDENT_AMBULATORY_CARE_PROVIDER_SITE_OTHER): Payer: Medicare Other | Admitting: Psychiatry

## 2017-11-25 ENCOUNTER — Encounter (HOSPITAL_COMMUNITY): Payer: Self-pay | Admitting: Psychiatry

## 2017-11-25 DIAGNOSIS — Z8673 Personal history of transient ischemic attack (TIA), and cerebral infarction without residual deficits: Secondary | ICD-10-CM | POA: Diagnosis not present

## 2017-11-25 DIAGNOSIS — F339 Major depressive disorder, recurrent, unspecified: Secondary | ICD-10-CM | POA: Diagnosis not present

## 2017-11-25 DIAGNOSIS — Z87891 Personal history of nicotine dependence: Secondary | ICD-10-CM | POA: Diagnosis not present

## 2017-11-25 DIAGNOSIS — F410 Panic disorder [episodic paroxysmal anxiety] without agoraphobia: Secondary | ICD-10-CM

## 2017-11-25 DIAGNOSIS — I739 Peripheral vascular disease, unspecified: Secondary | ICD-10-CM | POA: Diagnosis not present

## 2017-11-25 DIAGNOSIS — Z818 Family history of other mental and behavioral disorders: Secondary | ICD-10-CM

## 2017-11-25 DIAGNOSIS — Z79899 Other long term (current) drug therapy: Secondary | ICD-10-CM

## 2017-11-25 DIAGNOSIS — G8929 Other chronic pain: Secondary | ICD-10-CM

## 2017-11-25 DIAGNOSIS — F419 Anxiety disorder, unspecified: Secondary | ICD-10-CM

## 2017-11-25 DIAGNOSIS — F0630 Mood disorder due to known physiological condition, unspecified: Secondary | ICD-10-CM | POA: Diagnosis not present

## 2017-11-25 DIAGNOSIS — M549 Dorsalgia, unspecified: Secondary | ICD-10-CM | POA: Diagnosis not present

## 2017-11-25 MED ORDER — DESVENLAFAXINE SUCCINATE ER 50 MG PO TB24: 50.0000 mg | ORAL_TABLET | Freq: Every day | ORAL | 2 refills | Status: DC

## 2017-11-25 MED ORDER — TRAZODONE HCL 100 MG PO TABS: 100.0000 mg | ORAL_TABLET | Freq: Every day | ORAL | 2 refills | Status: DC

## 2017-11-25 NOTE — Progress Notes (Signed)
BH MD/PA/NP OP Progress Note  11/25/2017 8:42 AM Laurie Horton  MRN:  [id number]  Chief Complaint: I am doing fine but I have a lot of pain in my back.  I am seeing her daughter because of blockage.  HPI: Laurie Horton came for her follow-up appointment.  She is compliant with medication and denies any side effects.  Recently she is very anxious and nervous about her physical health.  She saw cardiologist because of blockage in her arteries.  Sometimes she has difficulty walking due to claudication.  She is also concerned about her daughter who lives in [place].  Patient told she is losing her eyesight and she is very worried about her.  Her other daughter who lives in [place] does not talk to her.  She has a son who has not seen in 45 years who lives in [place].  Patient has limited social network.  Since she stopped the Lexapro she is no longer having hallucination.  She feels Pristiq working well.  She denies any major panic attack but she has Valium 2 mg which she takes if she had severe anxiety attack.  She is not interested in counseling.  Her sleep is on and off.  Patient taking multiple pain medication and muscle relaxant.  She is aware that she is taking a lot of medication but she does not take these medication every day.  She lives with her cats and dogs.  Patient has no tremors or shakes.  She is seeing pain specialist for her chronic pain.  Patient denies drinking alcohol or using any illegal substances.  She denies any crying spells or any feeling of hopelessness or worthlessness.  She denies any suicidal thoughts or any homicidal thoughts.  Her energy level is fair.  Visit Diagnosis:    ICD-10-CM   1. 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 Patient denies any previous history of psychiatric inpatient treatment, suicidal attempt, paranoia,  hallucination or any mania. She started seeing psychiatrist in 2006 after she had a stroke. She had tried Cymbalta and Celexa in the past with limited response. She tried Ambien, Lunesta, lexapro and Xanax but developed allergies and side effects. We tried increasing Pristiq but she couldn't handle the side effects  Past Medical History:  Past Medical History:  Diagnosis Date  . CHF (congestive heart failure) ([place]) 03/02/2017   EF 25-30% 2018  . Complication of anesthesia    various issues with oxygen  saturations post op  . COPD (chronic obstructive pulmonary disease) ([place])   . Depression   . Diverticulitis   . DVT (deep venous thrombosis) ([place])   . Fibromyalgia   . GERD (gastroesophageal reflux disease)   . Hiatal hernia   . History of deviated nasal septum    left- side  . HTN (hypertension)   . Hyperlipidemia   . Obesity   . On supplemental oxygen therapy    concentrator at night @ 1.5 l/m or when sleeps. O2 Sat niormally 87.  . Osteoporosis   . PAT (paroxysmal atrial tachycardia) ([place])   . PFO (patent foramen ovale)   . PULMONARY NODULE, LEFT LOWER LOBE 10/14/2009   67mm LLL nodule dec 2010. Stable and 38mm in Oct 2012. No further fu  . Right middle lobe pneumonia ([place]) 07/24/2011   First noted at admit 07/10/11. Persists on cxr 07/22/11. Cleared on  CT 08/24/11. No further followup  . Stroke ([place])   . TOBACCO ABUSE 06/04/2009    Past Surgical History:  Procedure Laterality Date  . CARDIOVASCULAR STRESS TEST  12/26/2004   EF 74%. NO EVIDENCE OF ISCHEMIA  . ESOPHAGOGASTRODUODENOSCOPY (EGD) WITH PROPOFOL N/A 04/15/2015   Procedure: ESOPHAGOGASTRODUODENOSCOPY (EGD) WITH PROPOFOL;  Surgeon: Laurence Spates, MD;  Location: [facility];  Service: Endoscopy;  Laterality: N/A;  . KNEE ARTHROSCOPY  2000   left  . LAPAROSCOPIC CHOLECYSTECTOMY  04-16-2010   [facility]  . LOWER EXTREMITY ANGIOGRAPHY N/A 09/09/2017   Procedure: Lower Extremity Angiography;  Surgeon: Lorretta Harp, MD;   Location: [facility];  Service: Cardiovascular;  Laterality: N/A;  . MOUTH SURGERY     03-26-15 multiple extractions stitches remains  . RIGHT/LEFT HEART CATH AND CORONARY ANGIOGRAPHY N/A 03/04/2017   Procedure: Right/Left Heart Cath and Coronary Angiography;  Surgeon: Peter M Martinique, MD;  Location: [facility];  Service: Cardiovascular;  Laterality: N/A;  . TOTAL ABDOMINAL HYSTERECTOMY     post op needed oxygen was told "she gave them a scare"  . TUBAL LIGATION    . US ECHOCARDIOGRAPHY  11/20/2009   EF 55-60%    Family Psychiatric History: Reviewed.  Family History:  Family History  Problem Relation Age of Onset  . Dementia Mother   . Diabetes Mother   . Alzheimer's disease Mother   . Heart attack Brother 63  . Heart attack Father   . Schizophrenia Sister   . Diabetes Sister   . Tremor Sister     Social History:  Social History   Socioeconomic History  . Marital status: Divorced    Spouse name: None  . Number of children: 2  . Years of education: None  . Highest education level: None  Social Needs  . Financial resource strain: None  . Food insecurity - worry: None  . Food insecurity - inability: None  . Transportation needs - medical: None  . Transportation needs - non-medical: None  Occupational History  . Occupation: disability  Tobacco Use  . Smoking status: Former Smoker    Last attempt to quit: 06/02/2010    Years since quitting: 7.4  . Smokeless tobacco: Never Used  Substance and Sexual Activity  . Alcohol use: No    Alcohol/week: 0.0 oz  . Drug use: No  . Sexual activity: Yes    Partners: Male    Birth control/protection: None  Other Topics Concern  . None  Social History Narrative   Marital status: divorced in 1978 after ten years; dating casually      Children: 3 biological children; 3 court appointed children; 6 grandchildren; 5 gg      Lives: alone; children in [place] and [place].      Employment: retired age 48; disability for  COPD, CVA at age 66.      Tobacco: former smoker; quit smoking 2011.      Alcohol: none      Exercise: walks dog four times per day; goes to pool three times per week.       ADLs: drives; no assistant devices; does have a walker.  Cleaning is limited in 2018.  Daughter helps with cleaning.  Does own grocery shopping.      Advanced Directives: YES; DNR/DNI; HCPOA:           Allergies:  Allergies  Allergen Reactions  . Alprazolam Other (See Comments)    REACTION: stops breathing  . Bee Venom Anaphylaxis  .  Iodine Anaphylaxis and Swelling    REACTION: swelling in throat  . Pseudoephedrine Hcl Er Shortness Of Breath  . Budesonide-Formoterol Fumarate Other (See Comments)    Blisters inside of mouth all over  . Crestor [Rosuvastatin Calcium] Other (See Comments)    Unable to walk  . Esomeprazole Magnesium Other (See Comments)    REACTION: "bouncing off walls"  . Flonase [Fluticasone Propionate] Other (See Comments)    NOSE BLEED  . Loratadine Other (See Comments)    claritin D causes shaking  . Lotrimin [Clotrimazole] Other (See Comments)    Mouth blisters  . Lunesta [Eszopiclone] Other (See Comments)    REACTION: "slept for a week"  . Oxcarbazepine Other (See Comments)    Causes deep sleep and dizziness  . Statins Other (See Comments)    Can't walk, legs won't work   . Zolpidem Tartrate Other (See Comments)    REACTION: "slept for a week"  . Clarithromycin Other (See Comments)    All "mycins" Puts into "a" fib Will take if has to for severe sinus infection  . Effexor [Venlafaxine] Nausea And Vomiting    cramps  . Lexapro [Escitalopram Oxalate] Other (See Comments)    hallucinations  . Aciphex [Rabeprazole Sodium] Rash  . Avelox [Moxifloxacin Hcl In Nacl] Other (See Comments)    Stomach cramps.   Darlin Coco [Valdecoxib] Rash  . Ceclor [Cefaclor] Rash  . Cephalexin Rash    Pt states that she is possibly allergic to this - had a reaction to Cefaclor in the past and she does  not want to these class drugs. Added per patient request.  . Covera-Hs [Verapamil Hcl] Palpitations  . Dicyclomine Hcl Rash  . Other Other (See Comments)    Glue from ekg/heart monitor leads --rash +  Any MYCINS  . Tessalon Perles Rash    Metabolic Disorder Labs: Recent Results (from the past 2160 hour(s))  Basic metabolic panel     Status: Abnormal   Collection Time: 08/31/17 11:36 AM  Result Value Ref Range   Glucose 153 (H) 65 - 99 mg/dL   BUN 14 8 - 27 mg/dL   Creatinine, Ser 0.96 0.57 - 1.00 mg/dL   GFR calc non Af Amer 60 >59 mL/min/1.73   GFR calc Af Amer 69 >59 mL/min/1.73   BUN/Creatinine Ratio 15 12 - 28   Sodium 136 134 - 144 mmol/L   Potassium 4.9 3.5 - 5.2 mmol/L   Chloride 97 96 - 106 mmol/L   CO2 23 20 - 29 mmol/L   Calcium 9.7 8.7 - 10.3 mg/dL  CBC with Differential/Platelet     Status: None   Collection Time: 08/31/17 11:36 AM  Result Value Ref Range   WBC 5.8 3.4 - 10.8 x10E3/uL   RBC 4.09 3.77 - 5.28 x10E6/uL   Hemoglobin 12.2 11.1 - 15.9 g/dL   Hematocrit 36.4 34.0 - 46.6 %   MCV 89 79 - 97 fL   MCH 29.8 26.6 - 33.0 pg   MCHC 33.5 31.5 - 35.7 g/dL   RDW 14.6 12.3 - 15.4 %   Platelets 160 150 - 379 x10E3/uL   Neutrophils 63 Not Estab. %   Lymphs 26 Not Estab. %   Monocytes 9 Not Estab. %   Eos 2 Not Estab. %   Basos 0 Not Estab. %   Neutrophils Absolute 3.6 1.4 - 7.0 x10E3/uL   Lymphocytes Absolute 1.5 0.7 - 3.1 x10E3/uL   Monocytes Absolute 0.5 0.1 - 0.9 x10E3/uL   EOS (  ABSOLUTE) 0.1 0.0 - 0.4 x10E3/uL   Basophils Absolute 0.0 0.0 - 0.2 x10E3/uL   Immature Granulocytes 0 Not Estab. %   Immature Grans (Abs) 0.0 0.0 - 0.1 x10E3/uL  Protime-INR     Status: Abnormal   Collection Time: 08/31/17 11:36 AM  Result Value Ref Range   INR 2.2 (H) 0.8 - 1.2    Comment: Reference interval is for non-anticoagulated patients. Suggested INR therapeutic range for Vitamin K antagonist therapy:    Standard Dose (moderate intensity                    therapeutic range):       2.0 - 3.0    Higher intensity therapeutic range       2.5 - 3.5    Prothrombin Time 21.7 (H) 9.1 - 12.0 sec  TSH     Status: None   Collection Time: 08/31/17 11:36 AM  Result Value Ref Range   TSH 1.990 0.450 - 4.500 uIU/mL  APTT     Status: Abnormal   Collection Time: 08/31/17 11:36 AM  Result Value Ref Range   aPTT 35 (H) 24 - 33 sec    Comment: This test has not been validated for monitoring unfractionated heparin therapy. aPTT-based therapeutic ranges for unfractionated heparin therapy have not been established. For general guidelines on Heparin monitoring, refer to the [employer].   Protime-INR     Status: None   Collection Time: 09/09/17  6:34 AM  Result Value Ref Range   Prothrombin Time 13.7 11.4 - 15.2 seconds   INR 1.06   POCT INR     Status: None   Collection Time: 09/22/17 11:02 AM  Result Value Ref Range   INR 2.4   Lipid panel     Status: Abnormal   Collection Time: 10/20/17  8:46 AM  Result Value Ref Range   Cholesterol, Total 166 100 - 199 mg/dL   Triglycerides 156 (H) 0 - 149 mg/dL   HDL 36 (L) >39 mg/dL   VLDL Cholesterol Cal 31 5 - 40 mg/dL   LDL Calculated 99 0 - 99 mg/dL   Chol/HDL Ratio 4.6 (H) 0.0 - 4.4 ratio    Comment:                                   T. Chol/HDL Ratio                                             Men  Women                               1/2 Avg.Risk  3.4    3.3                                   Avg.Risk  5.0    4.4                                2X Avg.Risk  9.6    7.1  3X Avg.Risk 23.4   11.0   Comprehensive metabolic panel     Status: Abnormal   Collection Time: 10/20/17  8:46 AM  Result Value Ref Range   Glucose 129 (H) 65 - 99 mg/dL   BUN 13 8 - 27 mg/dL   Creatinine, Ser 0.97 0.57 - 1.00 mg/dL   GFR calc non Af Amer 59 (L) >59 mL/min/1.73   GFR calc Af Amer 68 >59 mL/min/1.73   BUN/Creatinine Ratio 13 12 - 28   Sodium 137 134 - 144 mmol/L    Potassium 4.6 3.5 - 5.2 mmol/L   Chloride 96 96 - 106 mmol/L   CO2 24 20 - 29 mmol/L   Calcium 9.6 8.7 - 10.3 mg/dL   Total Protein 6.6 6.0 - 8.5 g/dL   Albumin 4.6 3.5 - 4.8 g/dL   Globulin, Total 2.0 1.5 - 4.5 g/dL   Albumin/Globulin Ratio 2.3 (H) 1.2 - 2.2   Bilirubin Total 0.3 0.0 - 1.2 mg/dL   Alkaline Phosphatase 89 39 - 117 IU/L   AST 19 0 - 40 IU/L   ALT 14 0 - 32 IU/L  CBC with Differential/Platelet     Status: None   Collection Time: 10/20/17  8:46 AM  Result Value Ref Range   WBC 5.3 3.4 - 10.8 x10E3/uL   RBC 4.20 3.77 - 5.28 x10E6/uL   Hemoglobin 12.3 11.1 - 15.9 g/dL   Hematocrit 37.2 34.0 - 46.6 %   MCV 89 79 - 97 fL   MCH 29.3 26.6 - 33.0 pg   MCHC 33.1 31.5 - 35.7 g/dL   RDW 14.7 12.3 - 15.4 %   Platelets 255 150 - 379 x10E3/uL   Neutrophils 66 Not Estab. %   Lymphs 20 Not Estab. %   Monocytes 12 Not Estab. %   Eos 2 Not Estab. %   Basos 0 Not Estab. %   Neutrophils Absolute 3.5 1.4 - 7.0 x10E3/uL   Lymphocytes Absolute 1.1 0.7 - 3.1 x10E3/uL   Monocytes Absolute 0.6 0.1 - 0.9 x10E3/uL   EOS (ABSOLUTE) 0.1 0.0 - 0.4 x10E3/uL   Basophils Absolute 0.0 0.0 - 0.2 x10E3/uL   Immature Granulocytes 0 Not Estab. %   Immature Grans (Abs) 0.0 0.0 - 0.1 x10E3/uL  POCT INR     Status: None   Collection Time: 11/03/17 12:00 AM  Result Value Ref Range   INR 2.1    Lab Results  Component Value Date   HGBA1C 6.0 02/06/2016   MPG 120 (H) 05/04/2013   No results found for: PROLACTIN Lab Results  Component Value Date   CHOL 166 10/20/2017   TRIG 156 (H) 10/20/2017   HDL 36 (L) 10/20/2017   CHOLHDL 4.6 (H) 10/20/2017   VLDL 25 07/02/2016   LDLCALC 99 10/20/2017   LDLCALC 128 (H) 12/09/2016   Lab Results  Component Value Date   TSH 1.990 08/31/2017   TSH 1.960 08/16/2015    Therapeutic Level Labs: No results found for: LITHIUM No results found for: VALPROATE No components found for:  CBMZ  Current Medications: Current Outpatient Medications  Medication  Sig Dispense Refill  . carvedilol (COREG) 12.5 MG tablet Take 1 tablet (12.5 mg total) by mouth 2 (two) times daily. 60 tablet 11  . desvenlafaxine (PRISTIQ) 50 MG 24 hr tablet Take 1 tablet (50 mg total) by mouth daily. 30 tablet 2  . diazepam (VALIUM) 2 MG tablet Take 1 tablet (2 mg total) by mouth daily as needed for anxiety. 10  tablet 0  . furosemide (LASIX) 40 MG tablet Take 1 tablet (40 mg total) by mouth daily. 30 tablet 11  . gabapentin (NEURONTIN) 100 MG capsule Take 100-200 mg by mouth 3 (three) times daily. 100 mg in am, 200 mg at noon and  200 mg at bedtime    . HYDROcodone-acetaminophen (NORCO/VICODIN) 5-325 MG tablet Take 1 tablet by mouth 3 (three) times daily.     Marland Kitchen ipratropium (ATROVENT) 0.02 % nebulizer solution USE 1 VIAL BY NEBULIZATION 4 (FOUR) TIMES DAILY. (Patient taking differently: USE 1 VIAL BY NEBULIZATION  3 TIMES DAILY AS NEEDED FOR SHORTNESS OF BREATH) 75 mL 3  . losartan (COZAAR) 25 MG tablet Take 1 tablet (25 mg total) by mouth daily. 30 tablet 11  . montelukast (SINGULAIR) 10 MG tablet TAKE 1 TABLET BY MOUTH EVERYDAY AT BEDTIME 30 tablet 1  . ondansetron (ZOFRAN ODT) 8 MG disintegrating tablet Take 1 tablet (8 mg total) by mouth every 8 (eight) hours as needed for nausea or vomiting. 20 tablet 0  . oxybutynin (DITROPAN-XL) 10 MG 24 hr tablet TAKE 1 TABLET BY MOUTH EVERY DAY 30 tablet 0  . pantoprazole (PROTONIX) 40 MG tablet Take 1 tablet by mouth daily.     . polyethylene glycol (MIRALAX / GLYCOLAX) packet Take 17 g by mouth daily as needed for mild constipation. TAKES TEASPOONFUL IN 8 OZ OF WATER    . rosuvastatin (CRESTOR) 5 MG tablet Take 1 tablet (5 mg total) by mouth once a week. Sunday 12 tablet 3  . SENNA PO Take 2 capsules by mouth daily.    Marland Kitchen spironolactone (ALDACTONE) 25 MG tablet Take 1 tablet (25 mg total) by mouth daily. 30 tablet 11  . tiZANidine (ZANAFLEX) 2 MG tablet Take 2 mg by mouth 3 (three) times daily as needed for muscle spasms. Takes a 1/2-1  tablet depending on pain level    . traZODone (DESYREL) 100 MG tablet Take 1 tablet (100 mg total) by mouth at bedtime. 90 tablet 0  . triamcinolone ointment (KENALOG) 0.1 % Apply 1 application topically 3 (three) times daily as needed (EAR IRRITATION).     Marland Kitchen warfarin (COUMADIN) 5 MG tablet TAKE 1/2 TO 1 TABLET BY MOUTH EVERY DAY OR AS DIRECTED BY COUMADIN CLINIC 90 tablet 0   No current facility-administered medications for this visit.      Musculoskeletal: Strength & Muscle Tone: within normal limits Gait & Station: normal Patient leans: N/A  Psychiatric Specialty Exam: Review of Systems  Musculoskeletal: Positive for back pain and joint pain.  Neurological: Positive for tingling.    Blood pressure 109/66, pulse 73, height 5\' 3"  (1.6 m), weight 186 lb 3.2 oz (84.5 kg).Body mass index is 32.98 kg/m.  General Appearance: Casual  Eye Contact:  Good  Speech:  Clear and Coherent  Volume:  Normal  Mood:  Anxious  Affect:  Congruent  Thought Process:  Goal Directed  Orientation:  Full (Time, Place, and Person)  Thought Content: Rumination   Suicidal Thoughts:  No  Homicidal Thoughts:  No  Memory:  Immediate;   Fair Recent;   Fair Remote;   Fair  Judgement:  Good  Insight:  Good  Psychomotor Activity:  Increased  Concentration:  Concentration: Fair and Attention Span: Fair  Recall:  [employer] of Knowledge: Good  Language: Good  Akathisia:  No  Handed:  Right  AIMS (if indicated): not done  Assets:  Communication Skills Desire for Improvement Housing Resilience  ADL's:  Intact  Cognition: WNL  Sleep:  Fair   Screenings: PHQ2-9     Office Visit from 10/20/2017 in Primary Care at [facility] from 04/28/2017 in Primary Care at [place] from 02/08/2017 in Primary Care at [facility] Erroneous Encounter from 12/01/2016 in [facility] from 09/12/2016 in Primary Care at [facility] Total Score  0  0  0  0  0       Assessment and  Plan: Major depressive disorder, recurrent.  Anxiety disorder NOS.  Panic attacks.  She is no longer taking Lexapro.  Hallucinations are gone.  Reviewed records from other collect information including recent blood work results.  She wants to continue Pristiq 50 mg daily and Valium 2 mg as needed.  We have provided 10 tablets of Valium 2 mg and she still have 6 tablets left.  Discussed medication side effects and also polypharmacy and risk and benefits explained in detail.  Patient is not interested in counseling.  Recommended to call us back if she has any question or any concern.  Follow-up in 3 months.   Kathlee Nations, MD 11/25/2017, 8:42 AM

## 2017-12-03 ENCOUNTER — Encounter: Payer: Self-pay | Admitting: Family Medicine

## 2017-12-15 ENCOUNTER — Ambulatory Visit (INDEPENDENT_AMBULATORY_CARE_PROVIDER_SITE_OTHER): Payer: Medicare Other | Admitting: Pharmacist

## 2017-12-15 DIAGNOSIS — Z7901 Long term (current) use of anticoagulants: Secondary | ICD-10-CM | POA: Diagnosis not present

## 2017-12-15 DIAGNOSIS — I480 Paroxysmal atrial fibrillation: Secondary | ICD-10-CM

## 2017-12-15 LAB — POCT INR: INR: 2.1

## 2017-12-15 MED ORDER — ROSUVASTATIN CALCIUM 5 MG PO TABS: 5.0000 mg | ORAL_TABLET | ORAL | 3 refills | Status: DC

## 2017-12-24 ENCOUNTER — Encounter: Payer: Self-pay | Admitting: Cardiovascular Disease

## 2017-12-24 ENCOUNTER — Ambulatory Visit (INDEPENDENT_AMBULATORY_CARE_PROVIDER_SITE_OTHER): Payer: Medicare Other | Admitting: Cardiovascular Disease

## 2017-12-24 DIAGNOSIS — I739 Peripheral vascular disease, unspecified: Secondary | ICD-10-CM | POA: Diagnosis not present

## 2017-12-24 NOTE — Progress Notes (Signed)
12/24/2017 Laurie Horton   11/07/1945  [id number]  Primary Physician Wardell Honour, MD Primary Cardiologist: Lorretta Harp MD Garret Reddish, [place], [place]  HPI:  Laurie Horton is a 72 y.o.   divorced Caucasian female mother of 38, grandmother of 66 grandchildren referred to me by Dr. Martinique for peripheral vascular evaluation because of limiting claudication. I last saw her in the office 11/10/17. . She does have a history of CAD status post right left heart cath performed 03/04/17 revealing severe 2 vessel obstructive CAD with occluded RCA and circumflex marginal disease. 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 She has a history of hypertension and hyperlipidemia. She also has fibromyalgia. She complains of left greater than right lower extremity claudication which is lifestyle limiting at less than a block. Lower extremity arterial Dopplers performed 07/28/17 revealed a right ABI 0.9 and a left of .82.  I perform abdominal aortography with bifemoral runoff 09/09/17 revealing an 80% calcified mid infrarenal abdominal aortic stenosis on a bend with a 25 mm gradient, 80% calcified left common iliac artery stenosis and what appeared to be a high-grade calcified right common femoral artery stenosis. She did see Dr. Trula Slade for consideration of aortobifemoral bypass grafting although she was evaluated by Dr. [name] who did not feel that she was adequate pulmonary candidate to undergo general anesthesia. She was referred back here for endovascular therapy.   Current Meds  Medication Sig  . carvedilol (COREG) 12.5 MG tablet Take 1 tablet (12.5 mg total) by mouth 2 (two) times daily.  Marland Kitchen desvenlafaxine (PRISTIQ) 50 MG 24 hr tablet Take 1 tablet (50 mg total) by mouth daily.  . diazepam (VALIUM) 2 MG tablet Take 1 tablet (2 mg total) by mouth daily as needed for anxiety.  . furosemide (LASIX) 40 MG tablet Take 1 tablet (40 mg total) by mouth daily.  Marland Kitchen gabapentin  (NEURONTIN) 100 MG capsule Take 100-200 mg by mouth 3 (three) times daily. 100 mg in am, 200 mg at noon and  200 mg at bedtime  . HYDROcodone-acetaminophen (NORCO/VICODIN) 5-325 MG tablet Take 1 tablet by mouth 3 (three) times daily.   Marland Kitchen ipratropium (ATROVENT) 0.02 % nebulizer solution USE 1 VIAL BY NEBULIZATION 4 (FOUR) TIMES DAILY. (Patient taking differently: USE 1 VIAL BY NEBULIZATION  3 TIMES DAILY AS NEEDED FOR SHORTNESS OF BREATH)  . losartan (COZAAR) 25 MG tablet Take 1 tablet (25 mg total) by mouth daily.  . montelukast (SINGULAIR) 10 MG tablet TAKE 1 TABLET BY MOUTH EVERYDAY AT BEDTIME  . ondansetron (ZOFRAN ODT) 8 MG disintegrating tablet Take 1 tablet (8 mg total) by mouth every 8 (eight) hours as needed for nausea or vomiting.  Marland Kitchen oxybutynin (DITROPAN-XL) 10 MG 24 hr tablet TAKE 1 TABLET BY MOUTH EVERY DAY  . pantoprazole (PROTONIX) 40 MG tablet Take 1 tablet by mouth daily.   . polyethylene glycol (MIRALAX / GLYCOLAX) packet Take 17 g by mouth daily as needed for mild constipation. TAKES TEASPOONFUL IN 8 OZ OF WATER  . rosuvastatin (CRESTOR) 5 MG tablet Take 1 tablet (5 mg total) by mouth 2 (two) times a week. Sunday and Wednesday  . SENNA PO Take 2 capsules by mouth daily.  Marland Kitchen spironolactone (ALDACTONE) 25 MG tablet Take 1 tablet (25 mg total) by mouth daily.  Marland Kitchen tiZANidine (ZANAFLEX) 2 MG tablet Take 2 mg by mouth 3 (three) times daily as needed for muscle spasms. Takes a 1/2-1 tablet depending on pain level  .  traZODone (DESYREL) 100 MG tablet Take 1 tablet (100 mg total) by mouth at bedtime.  . triamcinolone ointment (KENALOG) 0.1 % Apply 1 application topically 3 (three) times daily as needed (EAR IRRITATION).   Marland Kitchen warfarin (COUMADIN) 5 MG tablet TAKE 1/2 TO 1 TABLET BY MOUTH EVERY DAY OR AS DIRECTED BY COUMADIN CLINIC     Allergies  Allergen Reactions  . Alprazolam Other (See Comments)    REACTION: stops breathing  . Bee Venom Anaphylaxis  . Iodine Anaphylaxis and Swelling     REACTION: swelling in throat  . Pseudoephedrine Hcl Er Shortness Of Breath  . Budesonide-Formoterol Fumarate Other (See Comments)    Blisters inside of mouth all over  . Crestor [Rosuvastatin Calcium] Other (See Comments)    Unable to walk  . Esomeprazole Magnesium Other (See Comments)    REACTION: "bouncing off walls"  . Flonase [Fluticasone Propionate] Other (See Comments)    NOSE BLEED  . Loratadine Other (See Comments)    claritin D causes shaking  . Lotrimin [Clotrimazole] Other (See Comments)    Mouth blisters  . Lunesta [Eszopiclone] Other (See Comments)    REACTION: "slept for a week"  . Oxcarbazepine Other (See Comments)    Causes deep sleep and dizziness  . Statins Other (See Comments)    Can't walk, legs won't work   . Zolpidem Tartrate Other (See Comments)    REACTION: "slept for a week"  . Clarithromycin Other (See Comments)    All "mycins" Puts into "a" fib Will take if has to for severe sinus infection  . Effexor [Venlafaxine] Nausea And Vomiting    cramps  . Lexapro [Escitalopram Oxalate] Other (See Comments)    hallucinations  . Aciphex [Rabeprazole Sodium] Rash  . Avelox [Moxifloxacin Hcl In Nacl] Other (See Comments)    Stomach cramps.   Darlin Coco [Valdecoxib] Rash  . Ceclor [Cefaclor] Rash  . Cephalexin Rash    Pt states that she is possibly allergic to this - had a reaction to Cefaclor in the past and she does not want to these class drugs. Added per patient request.  . Covera-Hs [Verapamil Hcl] Palpitations  . Dicyclomine Hcl Rash  . Other Other (See Comments)    Glue from ekg/heart monitor leads --rash +  Any MYCINS  . Tessalon Perles Rash    Social History   Socioeconomic History  . Marital status: Divorced    Spouse name: Not on file  . Number of children: 2  . Years of education: Not on file  . Highest education level: Not on file  Social Needs  . Financial resource strain: Not on file  . Food insecurity - worry: Not on file  . Food  insecurity - inability: Not on file  . Transportation needs - medical: Not on file  . Transportation needs - non-medical: Not on file  Occupational History  . Occupation: disability  Tobacco Use  . Smoking status: Former Smoker    Last attempt to quit: 06/02/2010    Years since quitting: 7.5  . Smokeless tobacco: Never Used  Substance and Sexual Activity  . Alcohol use: No    Alcohol/week: 0.0 oz  . Drug use: No  . Sexual activity: Yes    Partners: Male    Birth control/protection: None  Other Topics Concern  . Not on file  Social History Narrative   Marital status: divorced in 1978 after ten years; dating casually      Children: 3 biological children; 3 court appointed  children; 6 grandchildren; 5 gg      Lives: alone; children in [place] and [place].      Employment: retired age 26; disability for COPD, CVA at age 69.      Tobacco: former smoker; quit smoking 2011.      Alcohol: none      Exercise: walks dog four times per day; goes to pool three times per week.       ADLs: drives; no assistant devices; does have a walker.  Cleaning is limited in 2018.  Daughter helps with cleaning.  Does own grocery shopping.      Advanced Directives: YES; DNR/DNI; HCPOA:            Review of Systems: General: negative for chills, fever, night sweats or weight changes.  Cardiovascular: negative for chest pain, dyspnea on exertion, edema, orthopnea, palpitations, paroxysmal nocturnal dyspnea or shortness of breath Dermatological: negative for rash Respiratory: negative for cough or wheezing Urologic: negative for hematuria Abdominal: negative for nausea, vomiting, diarrhea, bright red blood per rectum, melena, or hematemesis Neurologic: negative for visual changes, syncope, or dizziness All other systems reviewed and are otherwise negative except as noted above.    Blood pressure (!) 141/72, pulse 86, height 5\' 3"  (1.6 m), weight 189 lb 3.2 oz (85.8 kg).  General appearance: alert and  no distress Neck: no adenopathy, no carotid bruit, no JVD, supple, symmetrical, trachea midline and thyroid not enlarged, symmetric, no tenderness/mass/nodules Lungs: clear to auscultation bilaterally Heart: regular rate and rhythm, S1, S2 normal, no murmur, click, rub or gallop Extremities: extremities normal, atraumatic, no cyanosis or edema Pulses: 2+ and symmetric Skin: Skin color, texture, turgor normal. No rashes or lesions Neurologic: Alert and oriented X 3, normal strength and tone. Normal symmetric reflexes. Normal coordination and gait  EKG Not performed today  ASSESSMENT AND PLAN:   Claudication in peripheral vascular disease ([place]) History of peripheral arterial disease with lifestyle limiting claudication status post angiography 09/09/17 revealing a high grade calcified infrarenal abdominal aortic stenosis on a bend not amenable to endovascular therapy. She also had calcified left common iliac and right common femoral stenosis. I did send her to Dr. Trula Slade for aortobifemoral bypass grafting however her pulmonary status precluded general anesthesia. She is back here in follow-up. We talked about performing diamondback orbital rotation atherectomy of her left common iliac artery which we will schedule sometime in the next several weeks.      Lorretta Harp MD FACP,FACC,FAHA, [facility] 12/24/2017 8:42 AM

## 2017-12-24 NOTE — Patient Instructions (Signed)
Medication Instructions: Your physician recommends that you continue on your current medications as directed. Please refer to the Current Medication list given to you today.  If you need a refill on your cardiac medications before your next appointment, please call your pharmacy.    Procedures/Testing: We will call you with instructions and finalized date for the procedure.   Your physician recommends that you schedule a follow-up appointment in: 2-4 weeks after the procedure tentatively scheduled for March 18th.   Thank you for choosing [facility] at [facility]!!

## 2017-12-24 NOTE — Assessment & Plan Note (Signed)
History of peripheral arterial disease with lifestyle limiting claudication status post angiography 09/09/17 revealing a high grade calcified infrarenal abdominal aortic stenosis on a bend not amenable to endovascular therapy. She also had calcified left common iliac and right common femoral stenosis. I did send her to Dr. Trula Slade for aortobifemoral bypass grafting however her pulmonary status precluded general anesthesia. She is back here in follow-up. We talked about performing diamondback orbital rotation atherectomy of her left common iliac artery which we will schedule sometime in the next several weeks.

## 2017-12-24 NOTE — H&P (View-Only) (Signed)
12/24/2017 SHANEEKA SCARBORO   October 30, 1946  [id number]  Primary Physician Wardell Honour, MD Primary Cardiologist: Lorretta Harp MD Garret Reddish, [place], [place]  HPI:  Laurie Horton is a 72 y.o.   divorced Caucasian female mother of 28, grandmother of 75 grandchildren referred to me by Dr. Martinique for peripheral vascular evaluation because of limiting claudication. I last saw her in the office 11/10/17. . She does have a history of CAD status post right left heart cath performed 03/04/17 revealing severe 2 vessel obstructive CAD with occluded RCA and circumflex marginal disease. 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 She has a history of hypertension and hyperlipidemia. She also has fibromyalgia. She complains of left greater than right lower extremity claudication which is lifestyle limiting at less than a block. Lower extremity arterial Dopplers performed 07/28/17 revealed a right ABI 0.9 and a left of .82.  I perform abdominal aortography with bifemoral runoff 09/09/17 revealing an 80% calcified mid infrarenal abdominal aortic stenosis on a bend with a 25 mm gradient, 80% calcified left common iliac artery stenosis and what appeared to be a high-grade calcified right common femoral artery stenosis. She did see Dr. Trula Slade for consideration of aortobifemoral bypass grafting although she was evaluated by Dr. [name] who did not feel that she was adequate pulmonary candidate to undergo general anesthesia. She was referred back here for endovascular therapy.   Current Meds  Medication Sig  . carvedilol (COREG) 12.5 MG tablet Take 1 tablet (12.5 mg total) by mouth 2 (two) times daily.  Marland Kitchen desvenlafaxine (PRISTIQ) 50 MG 24 hr tablet Take 1 tablet (50 mg total) by mouth daily.  . diazepam (VALIUM) 2 MG tablet Take 1 tablet (2 mg total) by mouth daily as needed for anxiety.  . furosemide (LASIX) 40 MG tablet Take 1 tablet (40 mg total) by mouth daily.  Marland Kitchen gabapentin  (NEURONTIN) 100 MG capsule Take 100-200 mg by mouth 3 (three) times daily. 100 mg in am, 200 mg at noon and  200 mg at bedtime  . HYDROcodone-acetaminophen (NORCO/VICODIN) 5-325 MG tablet Take 1 tablet by mouth 3 (three) times daily.   Marland Kitchen ipratropium (ATROVENT) 0.02 % nebulizer solution USE 1 VIAL BY NEBULIZATION 4 (FOUR) TIMES DAILY. (Patient taking differently: USE 1 VIAL BY NEBULIZATION  3 TIMES DAILY AS NEEDED FOR SHORTNESS OF BREATH)  . losartan (COZAAR) 25 MG tablet Take 1 tablet (25 mg total) by mouth daily.  . montelukast (SINGULAIR) 10 MG tablet TAKE 1 TABLET BY MOUTH EVERYDAY AT BEDTIME  . ondansetron (ZOFRAN ODT) 8 MG disintegrating tablet Take 1 tablet (8 mg total) by mouth every 8 (eight) hours as needed for nausea or vomiting.  Marland Kitchen oxybutynin (DITROPAN-XL) 10 MG 24 hr tablet TAKE 1 TABLET BY MOUTH EVERY DAY  . pantoprazole (PROTONIX) 40 MG tablet Take 1 tablet by mouth daily.   . polyethylene glycol (MIRALAX / GLYCOLAX) packet Take 17 g by mouth daily as needed for mild constipation. TAKES TEASPOONFUL IN 8 OZ OF WATER  . rosuvastatin (CRESTOR) 5 MG tablet Take 1 tablet (5 mg total) by mouth 2 (two) times a week. Sunday and Wednesday  . SENNA PO Take 2 capsules by mouth daily.  Marland Kitchen spironolactone (ALDACTONE) 25 MG tablet Take 1 tablet (25 mg total) by mouth daily.  Marland Kitchen tiZANidine (ZANAFLEX) 2 MG tablet Take 2 mg by mouth 3 (three) times daily as needed for muscle spasms. Takes a 1/2-1 tablet depending on pain level  .  traZODone (DESYREL) 100 MG tablet Take 1 tablet (100 mg total) by mouth at bedtime.  . triamcinolone ointment (KENALOG) 0.1 % Apply 1 application topically 3 (three) times daily as needed (EAR IRRITATION).   Marland Kitchen warfarin (COUMADIN) 5 MG tablet TAKE 1/2 TO 1 TABLET BY MOUTH EVERY DAY OR AS DIRECTED BY COUMADIN CLINIC     Allergies  Allergen Reactions  . Alprazolam Other (See Comments)    REACTION: stops breathing  . Bee Venom Anaphylaxis  . Iodine Anaphylaxis and Swelling     REACTION: swelling in throat  . Pseudoephedrine Hcl Er Shortness Of Breath  . Budesonide-Formoterol Fumarate Other (See Comments)    Blisters inside of mouth all over  . Crestor [Rosuvastatin Calcium] Other (See Comments)    Unable to walk  . Esomeprazole Magnesium Other (See Comments)    REACTION: "bouncing off walls"  . Flonase [Fluticasone Propionate] Other (See Comments)    NOSE BLEED  . Loratadine Other (See Comments)    claritin D causes shaking  . Lotrimin [Clotrimazole] Other (See Comments)    Mouth blisters  . Lunesta [Eszopiclone] Other (See Comments)    REACTION: "slept for a week"  . Oxcarbazepine Other (See Comments)    Causes deep sleep and dizziness  . Statins Other (See Comments)    Can't walk, legs won't work   . Zolpidem Tartrate Other (See Comments)    REACTION: "slept for a week"  . Clarithromycin Other (See Comments)    All "mycins" Puts into "a" fib Will take if has to for severe sinus infection  . Effexor [Venlafaxine] Nausea And Vomiting    cramps  . Lexapro [Escitalopram Oxalate] Other (See Comments)    hallucinations  . Aciphex [Rabeprazole Sodium] Rash  . Avelox [Moxifloxacin Hcl In Nacl] Other (See Comments)    Stomach cramps.   Darlin Coco [Valdecoxib] Rash  . Ceclor [Cefaclor] Rash  . Cephalexin Rash    Pt states that she is possibly allergic to this - had a reaction to Cefaclor in the past and she does not want to these class drugs. Added per patient request.  . Covera-Hs [Verapamil Hcl] Palpitations  . Dicyclomine Hcl Rash  . Other Other (See Comments)    Glue from ekg/heart monitor leads --rash +  Any MYCINS  . Tessalon Perles Rash    Social History   Socioeconomic History  . Marital status: Divorced    Spouse name: Not on file  . Number of children: 2  . Years of education: Not on file  . Highest education level: Not on file  Social Needs  . Financial resource strain: Not on file  . Food insecurity - worry: Not on file  . Food  insecurity - inability: Not on file  . Transportation needs - medical: Not on file  . Transportation needs - non-medical: Not on file  Occupational History  . Occupation: disability  Tobacco Use  . Smoking status: Former Smoker    Last attempt to quit: 06/02/2010    Years since quitting: 7.5  . Smokeless tobacco: Never Used  Substance and Sexual Activity  . Alcohol use: No    Alcohol/week: 0.0 oz  . Drug use: No  . Sexual activity: Yes    Partners: Male    Birth control/protection: None  Other Topics Concern  . Not on file  Social History Narrative   Marital status: divorced in 1978 after ten years; dating casually      Children: 3 biological children; 3 court appointed  children; 6 grandchildren; 5 gg      Lives: alone; children in [place] and [place].      Employment: retired age 84; disability for COPD, CVA at age 43.      Tobacco: former smoker; quit smoking 2011.      Alcohol: none      Exercise: walks dog four times per day; goes to pool three times per week.       ADLs: drives; no assistant devices; does have a walker.  Cleaning is limited in 2018.  Daughter helps with cleaning.  Does own grocery shopping.      Advanced Directives: YES; DNR/DNI; HCPOA:            Review of Systems: General: negative for chills, fever, night sweats or weight changes.  Cardiovascular: negative for chest pain, dyspnea on exertion, edema, orthopnea, palpitations, paroxysmal nocturnal dyspnea or shortness of breath Dermatological: negative for rash Respiratory: negative for cough or wheezing Urologic: negative for hematuria Abdominal: negative for nausea, vomiting, diarrhea, bright red blood per rectum, melena, or hematemesis Neurologic: negative for visual changes, syncope, or dizziness All other systems reviewed and are otherwise negative except as noted above.    Blood pressure (!) 141/72, pulse 86, height 5\' 3"  (1.6 m), weight 189 lb 3.2 oz (85.8 kg).  General appearance: alert and  no distress Neck: no adenopathy, no carotid bruit, no JVD, supple, symmetrical, trachea midline and thyroid not enlarged, symmetric, no tenderness/mass/nodules Lungs: clear to auscultation bilaterally Heart: regular rate and rhythm, S1, S2 normal, no murmur, click, rub or gallop Extremities: extremities normal, atraumatic, no cyanosis or edema Pulses: 2+ and symmetric Skin: Skin color, texture, turgor normal. No rashes or lesions Neurologic: Alert and oriented X 3, normal strength and tone. Normal symmetric reflexes. Normal coordination and gait  EKG Not performed today  ASSESSMENT AND PLAN:   Claudication in peripheral vascular disease ([place]) History of peripheral arterial disease with lifestyle limiting claudication status post angiography 09/09/17 revealing a high grade calcified infrarenal abdominal aortic stenosis on a bend not amenable to endovascular therapy. She also had calcified left common iliac and right common femoral stenosis. I did send her to Dr. Trula Slade for aortobifemoral bypass grafting however her pulmonary status precluded general anesthesia. She is back here in follow-up. We talked about performing diamondback orbital rotation atherectomy of her left common iliac artery which we will schedule sometime in the next several weeks.      Lorretta Harp MD FACP,FACC,FAHA, [facility] 12/24/2017 8:42 AM

## 2017-12-31 DIAGNOSIS — I739 Peripheral vascular disease, unspecified: Secondary | ICD-10-CM

## 2017-12-31 HISTORY — DX: Peripheral vascular disease, unspecified: I73.9

## 2018-01-01 ENCOUNTER — Other Ambulatory Visit: Payer: Self-pay | Admitting: Family Medicine

## 2018-01-10 ENCOUNTER — Telehealth: Payer: Self-pay | Admitting: Cardiovascular Disease

## 2018-01-10 DIAGNOSIS — I739 Peripheral vascular disease, unspecified: Secondary | ICD-10-CM

## 2018-01-10 DIAGNOSIS — Z01818 Encounter for other preprocedural examination: Secondary | ICD-10-CM

## 2018-01-10 NOTE — Telephone Encounter (Signed)
Returned the call to the patient. She stated that she needed instructions for her procedure on Monday 3/18. Lab orders have been placed for her and she will come in tomorrow to have these done.   She has been informed that Dr. Kennon Holter CMA will call her for more specific instructions pertaining to the procedure.

## 2018-01-10 NOTE — Telephone Encounter (Signed)
New Message  Pt is calling to get her instructions for her admit on 3/18. Please call

## 2018-01-11 ENCOUNTER — Encounter: Payer: Self-pay | Admitting: Anesthesiology

## 2018-01-11 ENCOUNTER — Other Ambulatory Visit: Payer: Self-pay

## 2018-01-11 ENCOUNTER — Encounter: Payer: Self-pay | Admitting: Cardiovascular Disease

## 2018-01-11 DIAGNOSIS — I480 Paroxysmal atrial fibrillation: Secondary | ICD-10-CM

## 2018-01-11 DIAGNOSIS — I10 Essential (primary) hypertension: Secondary | ICD-10-CM

## 2018-01-11 DIAGNOSIS — I5031 Acute diastolic (congestive) heart failure: Secondary | ICD-10-CM

## 2018-01-11 NOTE — Telephone Encounter (Signed)
Attempting to reach pt. Lab tech, Nilda Simmer, stated pt came in for labs early this morning. Trying to reach pt to ask her if she is able to come back to discuss coumadin with PharmD and go over procedure instructions. Left detailed message with this information--ok per DPR. Asked pt to return call as soon as possible.

## 2018-01-11 NOTE — Telephone Encounter (Signed)
Spoke to pt. She cannot come back to the office today, but PharmD will call her to discuss coumadin. Pt requested that I leave her CATH instructions via vm.

## 2018-01-11 NOTE — Telephone Encounter (Signed)
Called pt, who answered. Instructions for CATH given. Pt verbalized thanks and will await call from PharmD and to schedule post CATH f/u appt with Dr. Gwenlyn Found.   Message sent to scheduling to arrange this appt.

## 2018-01-12 ENCOUNTER — Other Ambulatory Visit: Payer: Self-pay | Admitting: Cardiovascular Disease

## 2018-01-12 DIAGNOSIS — I739 Peripheral vascular disease, unspecified: Secondary | ICD-10-CM

## 2018-01-12 LAB — CBC
HEMATOCRIT: 37.1 % (ref 34.0–46.6)
Hemoglobin: 12.4 g/dL (ref 11.1–15.9)
MCH: 29.3 pg (ref 26.6–33.0)
MCHC: 33.4 g/dL (ref 31.5–35.7)
MCV: 88 fL (ref 79–97)
PLATELETS: 205 10*3/uL (ref 150–379)
RBC: 4.23 x10E6/uL (ref 3.77–5.28)
RDW: 15.2 % (ref 12.3–15.4)
WBC: 5 10*3/uL (ref 3.4–10.8)

## 2018-01-12 LAB — BASIC METABOLIC PANEL
BUN/Creatinine Ratio: 13 (ref 12–28)
BUN: 14 mg/dL (ref 8–27)
CALCIUM: 9.5 mg/dL (ref 8.7–10.3)
CHLORIDE: 95 mmol/L — AB (ref 96–106)
CO2: 29 mmol/L (ref 20–29)
Creatinine, Ser: 1.07 mg/dL — ABNORMAL HIGH (ref 0.57–1.00)
GFR, EST AFRICAN AMERICAN: 60 mL/min/{1.73_m2} (ref >59)
GFR, EST NON AFRICAN AMERICAN: 52 mL/min/{1.73_m2} — AB (ref >59)
Glucose: 111 mg/dL — ABNORMAL HIGH (ref 65–99)
POTASSIUM: 4.6 mmol/L (ref 3.5–5.2)
Sodium: 140 mmol/L (ref 134–144)

## 2018-01-12 LAB — PROTIME-INR
INR: 2.2 — ABNORMAL HIGH (ref 0.8–1.2)
Prothrombin Time: 22.3 s — ABNORMAL HIGH (ref 9.1–12.0)

## 2018-01-12 LAB — TSH: TSH: 4.56 u[IU]/mL — ABNORMAL HIGH (ref 0.450–4.500)

## 2018-01-12 MED ORDER — ENOXAPARIN SODIUM 120 MG/0.8ML ~~LOC~~ SOLN: 120.0000 mg | SUBCUTANEOUS | 0 refills | Status: DC

## 2018-01-12 NOTE — Telephone Encounter (Signed)
Patient with diagnosis of atrial fibrillation on warfarin for anticoagulation.    Procedure: diamondback atherectomy Date of procedure: March 18  CHADS2-VASc score of  7 (CHF, HTN, AGE, stroke/tia x 2, CAD, female)  CrCl 64.4 Platelet count 205  Per office protocol, patient can hold warfarin for 5 days prior to procedure.    Patient will need bridging with Lovenox (enoxaparin) around procedure.  Patient has been given bridging instructions as well as post procedure warfarin dosing:  Enoxaparin 120 mg q24 hrs (1.5 mg/kg)  Stop warfarin with last dose being on Tuesday March 12.  Start enoxaparin Thursday March 14 in the evenings. Give enoxaparin Friday March 15 and Saturday March 16 in the evenings No enoxaparin on Sunday March 17.  Restart enoxaparin 120 mg once daily on day of discharge (patient aware to determine if dose is given before hospital d/c on Tuesday March 19) Continue enoxaparin 120 mg once daily.  Take warfarin 5 mg Tuesday March 19 and Wednesday March 20 then resume normal dose (2.5 mg qd x 5 mg MF).  Repeat INR Friday March 22.

## 2018-01-15 ENCOUNTER — Other Ambulatory Visit: Payer: Self-pay | Admitting: Family Medicine

## 2018-01-17 ENCOUNTER — Ambulatory Visit (HOSPITAL_COMMUNITY)
Admission: RE | Admit: 2018-01-17 | Discharge: 2018-01-18 | Disposition: A | Discharge status: Home / Self Care | Payer: Medicare Other | Source: Ambulatory Visit | Attending: Cardiovascular Disease | Admitting: Cardiovascular Disease

## 2018-01-17 ENCOUNTER — Encounter (HOSPITAL_COMMUNITY): Payer: Self-pay | Admitting: Cardiovascular Disease

## 2018-01-17 ENCOUNTER — Other Ambulatory Visit: Payer: Self-pay

## 2018-01-17 ENCOUNTER — Ambulatory Visit (HOSPITAL_COMMUNITY)
Admission: RE | Disposition: A | Discharge status: Home / Self Care | Payer: Self-pay | Source: Ambulatory Visit | Attending: Cardiovascular Disease

## 2018-01-17 DIAGNOSIS — Z7901 Long term (current) use of anticoagulants: Secondary | ICD-10-CM | POA: Diagnosis not present

## 2018-01-17 DIAGNOSIS — I272 Pulmonary hypertension, unspecified: Secondary | ICD-10-CM | POA: Insufficient documentation

## 2018-01-17 DIAGNOSIS — I700 Atherosclerosis of aorta: Secondary | ICD-10-CM | POA: Diagnosis not present

## 2018-01-17 DIAGNOSIS — I2510 Atherosclerotic heart disease of native coronary artery without angina pectoris: Secondary | ICD-10-CM | POA: Diagnosis not present

## 2018-01-17 DIAGNOSIS — E785 Hyperlipidemia, unspecified: Secondary | ICD-10-CM | POA: Diagnosis not present

## 2018-01-17 DIAGNOSIS — I739 Peripheral vascular disease, unspecified: Secondary | ICD-10-CM | POA: Diagnosis present

## 2018-01-17 DIAGNOSIS — I70212 Atherosclerosis of native arteries of extremities with intermittent claudication, left leg: Secondary | ICD-10-CM | POA: Insufficient documentation

## 2018-01-17 DIAGNOSIS — M797 Fibromyalgia: Secondary | ICD-10-CM | POA: Insufficient documentation

## 2018-01-17 DIAGNOSIS — Z883 Allergy status to other anti-infective agents status: Secondary | ICD-10-CM | POA: Diagnosis not present

## 2018-01-17 DIAGNOSIS — J449 Chronic obstructive pulmonary disease, unspecified: Secondary | ICD-10-CM | POA: Insufficient documentation

## 2018-01-17 DIAGNOSIS — Z87891 Personal history of nicotine dependence: Secondary | ICD-10-CM | POA: Insufficient documentation

## 2018-01-17 DIAGNOSIS — I10 Essential (primary) hypertension: Secondary | ICD-10-CM | POA: Diagnosis not present

## 2018-01-17 DIAGNOSIS — I429 Cardiomyopathy, unspecified: Secondary | ICD-10-CM | POA: Insufficient documentation

## 2018-01-17 DIAGNOSIS — Z8673 Personal history of transient ischemic attack (TIA), and cerebral infarction without residual deficits: Secondary | ICD-10-CM | POA: Diagnosis not present

## 2018-01-17 HISTORY — DX: Peripheral vascular disease, unspecified: I73.9

## 2018-01-17 HISTORY — PX: PERIPHERAL VASCULAR INTERVENTION: CATH118257

## 2018-01-17 HISTORY — PX: LOWER EXTREMITY INTERVENTION: CATH118252

## 2018-01-17 LAB — POCT ACTIVATED CLOTTING TIME
ACTIVATED CLOTTING TIME: 241 s
Activated Clotting Time: 235 seconds
Activated Clotting Time: 180 seconds

## 2018-01-17 LAB — PROTIME-INR
INR: 1.03
PROTHROMBIN TIME: 13.4 s (ref 11.4–15.2)

## 2018-01-17 SURGERY — LOWER EXTREMITY INTERVENTION
Anesthesia: LOCAL | Laterality: Left

## 2018-01-17 MED ORDER — NITROGLYCERIN 1 MG/10 ML FOR IR/CATH LAB
INTRA_ARTERIAL | Status: AC
  Filled 2018-01-17: qty 10

## 2018-01-17 MED ORDER — MONTELUKAST SODIUM 10 MG PO TABS: 5.0000 mg | ORAL_TABLET | Freq: Every day | ORAL | Status: DC

## 2018-01-17 MED ORDER — PANTOPRAZOLE SODIUM 40 MG PO TBEC
40.0000 mg | DELAYED_RELEASE_TABLET | Freq: Every day | ORAL | Status: DC
  Administered 2018-01-18: 40 mg via ORAL
  Filled 2018-01-17: qty 1

## 2018-01-17 MED ORDER — GABAPENTIN 100 MG PO CAPS
200.0000 mg | ORAL_CAPSULE | ORAL | Status: DC
  Filled 2018-01-17: qty 2

## 2018-01-17 MED ORDER — SODIUM CHLORIDE 0.9% FLUSH
3.0000 mL | Freq: Two times a day (BID) | INTRAVENOUS | Status: DC
  Administered 2018-01-17: 16:00:00 3 mL via INTRAVENOUS

## 2018-01-17 MED ORDER — SODIUM CHLORIDE 0.9 % IV SOLN
INTRAVENOUS | Status: AC
  Administered 2018-01-17: 16:00:00 via INTRAVENOUS

## 2018-01-17 MED ORDER — LABETALOL HCL 5 MG/ML IV SOLN: 10.0000 mg | INTRAVENOUS | Status: DC | PRN

## 2018-01-17 MED ORDER — ACETAMINOPHEN 325 MG PO TABS
650.0000 mg | ORAL_TABLET | ORAL | Status: DC | PRN
Start: 2018-01-17 — End: 2018-01-18

## 2018-01-17 MED ORDER — SODIUM CHLORIDE 0.9% FLUSH: 3.0000 mL | INTRAVENOUS | Status: DC | PRN

## 2018-01-17 MED ORDER — FAMOTIDINE IN NACL 20-0.9 MG/50ML-% IV SOLN
INTRAVENOUS | Status: AC | PRN
  Administered 2018-01-17: 20 mg via INTRAVENOUS

## 2018-01-17 MED ORDER — METHYLPREDNISOLONE SODIUM SUCC 125 MG IJ SOLR
125.0000 mg | Freq: Once | INTRAMUSCULAR | Status: AC
  Administered 2018-01-17: 125 mg via INTRAVENOUS

## 2018-01-17 MED ORDER — LOSARTAN POTASSIUM 50 MG PO TABS
25.0000 mg | ORAL_TABLET | Freq: Every day | ORAL | Status: DC
  Administered 2018-01-18: 10:00:00 25 mg via ORAL
  Filled 2018-01-17: qty 1

## 2018-01-17 MED ORDER — HEPARIN (PORCINE) IN NACL 2-0.9 UNIT/ML-% IJ SOLN
INTRAMUSCULAR | Status: AC
Start: 2018-01-17 — End: ?
  Filled 2018-01-17: qty 1000

## 2018-01-17 MED ORDER — ASPIRIN 81 MG PO CHEW
81.0000 mg | CHEWABLE_TABLET | ORAL | Status: AC
  Administered 2018-01-17: 81 mg via ORAL

## 2018-01-17 MED ORDER — IODIXANOL 320 MG/ML IV SOLN
INTRAVENOUS | Status: DC | PRN
  Administered 2018-01-17: 95 mL via INTRA_ARTERIAL

## 2018-01-17 MED ORDER — SPIRONOLACTONE 25 MG PO TABS
25.0000 mg | ORAL_TABLET | Freq: Every day | ORAL | Status: DC
  Administered 2018-01-18: 25 mg via ORAL
  Filled 2018-01-17: qty 1

## 2018-01-17 MED ORDER — ASPIRIN 81 MG PO CHEW
CHEWABLE_TABLET | ORAL | Status: AC
  Administered 2018-01-17: 81 mg via ORAL
  Filled 2018-01-17: qty 1

## 2018-01-17 MED ORDER — ONDANSETRON HCL 4 MG/2ML IJ SOLN
4.0000 mg | Freq: Four times a day (QID) | INTRAMUSCULAR | Status: DC | PRN
  Administered 2018-01-18: 4 mg via INTRAVENOUS
  Filled 2018-01-17: qty 2

## 2018-01-17 MED ORDER — GABAPENTIN 100 MG PO CAPS
100.0000 mg | ORAL_CAPSULE | Freq: Every day | ORAL | Status: DC
  Administered 2018-01-18: 10:00:00 100 mg via ORAL
  Filled 2018-01-17: qty 1

## 2018-01-17 MED ORDER — CARVEDILOL 12.5 MG PO TABS
12.5000 mg | ORAL_TABLET | Freq: Two times a day (BID) | ORAL | Status: DC
  Administered 2018-01-17 – 2018-01-18 (×2): 12.5 mg via ORAL
  Filled 2018-01-17 (×2): qty 1

## 2018-01-17 MED ORDER — CLOPIDOGREL BISULFATE 300 MG PO TABS
ORAL_TABLET | ORAL | Status: DC | PRN
  Administered 2018-01-17: 300 mg via ORAL

## 2018-01-17 MED ORDER — SODIUM CHLORIDE 0.9 % IV SOLN: 250.0000 mL | INTRAVENOUS | Status: DC | PRN

## 2018-01-17 MED ORDER — VIPERSLIDE LUBRICANT OPTIME
TOPICAL | Status: DC | PRN
  Administered 2018-01-17: 08:00:00 via SURGICAL_CAVITY

## 2018-01-17 MED ORDER — DIPHENHYDRAMINE HCL 50 MG/ML IJ SOLN
INTRAMUSCULAR | Status: AC
  Administered 2018-01-17: 25 mg via INTRAVENOUS
  Filled 2018-01-17: qty 1

## 2018-01-17 MED ORDER — ROSUVASTATIN CALCIUM 10 MG PO TABS: 5.0000 mg | ORAL_TABLET | ORAL | Status: DC

## 2018-01-17 MED ORDER — HEPARIN (PORCINE) IN NACL 2-0.9 UNIT/ML-% IJ SOLN
INTRAMUSCULAR | Status: AC | PRN
  Administered 2018-01-17: 2000 mL

## 2018-01-17 MED ORDER — HEPARIN SODIUM (PORCINE) 1000 UNIT/ML IJ SOLN
INTRAMUSCULAR | Status: AC
  Filled 2018-01-17: qty 1

## 2018-01-17 MED ORDER — TRAZODONE HCL 50 MG PO TABS
100.0000 mg | ORAL_TABLET | Freq: Every day | ORAL | Status: DC
  Administered 2018-01-17: 100 mg via ORAL
  Filled 2018-01-17: qty 2

## 2018-01-17 MED ORDER — HYDRALAZINE HCL 20 MG/ML IJ SOLN: 5.0000 mg | INTRAMUSCULAR | Status: DC | PRN

## 2018-01-17 MED ORDER — FUROSEMIDE 40 MG PO TABS
40.0000 mg | ORAL_TABLET | Freq: Every day | ORAL | Status: DC
  Administered 2018-01-17 – 2018-01-18 (×2): 40 mg via ORAL
  Filled 2018-01-17 (×2): qty 1

## 2018-01-17 MED ORDER — CLOPIDOGREL BISULFATE 300 MG PO TABS
ORAL_TABLET | ORAL | Status: AC
  Filled 2018-01-17: qty 1

## 2018-01-17 MED ORDER — IPRATROPIUM BROMIDE 0.02 % IN SOLN: 0.5000 mg | RESPIRATORY_TRACT | Status: DC | PRN

## 2018-01-17 MED ORDER — LIDOCAINE HCL (PF) 1 % IJ SOLN
INTRAMUSCULAR | Status: DC | PRN
  Administered 2018-01-17: 15 mL

## 2018-01-17 MED ORDER — SODIUM CHLORIDE 0.9 % WEIGHT BASED INFUSION: 1.0000 mL/kg/h | INTRAVENOUS | Status: DC

## 2018-01-17 MED ORDER — VERAPAMIL HCL 2.5 MG/ML IV SOLN
INTRAVENOUS | Status: AC
  Filled 2018-01-17: qty 2

## 2018-01-17 MED ORDER — LIDOCAINE HCL 1 % IJ SOLN
INTRAMUSCULAR | Status: AC
  Filled 2018-01-17: qty 20

## 2018-01-17 MED ORDER — OXYBUTYNIN CHLORIDE ER 10 MG PO TB24
10.0000 mg | ORAL_TABLET | Freq: Every day | ORAL | Status: DC
  Administered 2018-01-18: 10:00:00 10 mg via ORAL
  Filled 2018-01-17: qty 1

## 2018-01-17 MED ORDER — POLYETHYLENE GLYCOL 3350 17 G PO PACK
17.0000 g | PACK | ORAL | Status: DC
  Administered 2018-01-18: 10:00:00 17 g via ORAL
  Filled 2018-01-17 (×2): qty 1

## 2018-01-17 MED ORDER — NITROGLYCERIN IN D5W 200-5 MCG/ML-% IV SOLN
INTRAVENOUS | Status: AC
  Filled 2018-01-17: qty 250

## 2018-01-17 MED ORDER — HEPARIN SODIUM (PORCINE) 1000 UNIT/ML IJ SOLN
INTRAMUSCULAR | Status: DC | PRN
  Administered 2018-01-17: 7500 [IU] via INTRAVENOUS
  Administered 2018-01-17: 2000 [IU] via INTRAVENOUS

## 2018-01-17 MED ORDER — SODIUM CHLORIDE 0.9 % WEIGHT BASED INFUSION
3.0000 mL/kg/h | INTRAVENOUS | Status: DC
  Administered 2018-01-17: 3 mL/kg/h via INTRAVENOUS

## 2018-01-17 MED ORDER — METHYLPREDNISOLONE SODIUM SUCC 125 MG IJ SOLR
INTRAMUSCULAR | Status: AC
  Administered 2018-01-17: 125 mg via INTRAVENOUS
  Filled 2018-01-17: qty 2

## 2018-01-17 MED ORDER — HYDROCODONE-ACETAMINOPHEN 5-325 MG PO TABS
1.0000 | ORAL_TABLET | Freq: Three times a day (TID) | ORAL | Status: DC
  Administered 2018-01-17 – 2018-01-18 (×3): 1 via ORAL
  Filled 2018-01-17 (×4): qty 1

## 2018-01-17 MED ORDER — ANGIOPLASTY BOOK
Freq: Once | Status: AC
  Administered 2018-01-18: 05:00:00
  Filled 2018-01-17: qty 1

## 2018-01-17 MED ORDER — FENTANYL CITRATE (PF) 100 MCG/2ML IJ SOLN
INTRAMUSCULAR | Status: DC | PRN
  Administered 2018-01-17: 25 ug via INTRAVENOUS

## 2018-01-17 MED ORDER — FENTANYL CITRATE (PF) 100 MCG/2ML IJ SOLN
INTRAMUSCULAR | Status: AC
  Filled 2018-01-17: qty 2

## 2018-01-17 MED ORDER — CLOPIDOGREL BISULFATE 75 MG PO TABS
75.0000 mg | ORAL_TABLET | Freq: Every day | ORAL | Status: DC
  Administered 2018-01-18: 08:00:00 75 mg via ORAL
  Filled 2018-01-17: qty 1

## 2018-01-17 MED ORDER — DIPHENHYDRAMINE HCL 50 MG/ML IJ SOLN
25.0000 mg | Freq: Once | INTRAMUSCULAR | Status: AC
  Administered 2018-01-17: 25 mg via INTRAVENOUS

## 2018-01-17 MED ORDER — FAMOTIDINE IN NACL 20-0.9 MG/50ML-% IV SOLN
INTRAVENOUS | Status: AC
  Filled 2018-01-17: qty 50

## 2018-01-17 SURGICAL SUPPLY — 22 items
BALLN MUSTANG 8X20X75 (BALLOONS) ×2
BALLOON MUSTANG 8X20X75 (BALLOONS) ×1 IMPLANT
CATH ANGIO 5F PIGTAIL 65CM (CATHETERS) ×2 IMPLANT
CATH CROSS OVER TEMPO 5F (CATHETERS) ×2 IMPLANT
CATH STRAIGHT 5FR 65CM (CATHETERS) ×2 IMPLANT
DEVICE CONTINUOUS FLUSH (MISCELLANEOUS) ×2 IMPLANT
DIAMONDBACK SOLID OAS 2.0MM (CATHETERS) ×2
KIT ENCORE 26 ADVANTAGE (KITS) ×2 IMPLANT
KIT PV (KITS) ×2 IMPLANT
SHEATH AVANTI 11CM 5FR (SHEATH) ×2 IMPLANT
SHEATH AVANTI 11CM 7FR (SHEATH) ×2 IMPLANT
SHEATH BRITE TIP 7FR 35CM (SHEATH) ×2 IMPLANT
STENT VIABAHN 7X39X80 VBX (Permanent Stent) ×2 IMPLANT
SYRINGE MEDRAD AVANTA MACH 7 (SYRINGE) ×2 IMPLANT
SYSTEM DIMNDBCK SLD OAS 2.0MM (CATHETERS) ×1 IMPLANT
TAPE VIPERTRACK RADIOPAQ (MISCELLANEOUS) ×1 IMPLANT
TAPE VIPERTRACK RADIOPAQUE (MISCELLANEOUS) ×1
TRANSDUCER W/STOPCOCK (MISCELLANEOUS) ×2 IMPLANT
TRAY PV CATH (CUSTOM PROCEDURE TRAY) ×2 IMPLANT
TUBING CIL FLEX 10 FLL-RA (TUBING) ×2 IMPLANT
WIRE HITORQ VERSACORE ST 145CM (WIRE) ×2 IMPLANT
WIRE VIPER ADVANCE .017X335CM (WIRE) ×2 IMPLANT

## 2018-01-17 NOTE — Discharge Summary (Signed)
Discharge Summary    Patient ID: Laurie Horton,  MRN: [id number], DOB/AGE: 1946/03/14 72 y.o.  Admit date: 01/17/2018 Discharge date: 01/18/2018  Primary Care Provider: Wardell Honour Primary Cardiologist: Dr.Jordan (Dr. Carney Bern)   Discharge Diagnoses    Active Problems:   Claudication in peripheral vascular disease ([place])  Allergies Allergies  Allergen Reactions  . Alprazolam Anaphylaxis and Other (See Comments)    REACTION: stops breathing  . Bee Venom Anaphylaxis  . Iodine Anaphylaxis, Swelling and Other (See Comments)    REACTION: swelling in throat  . Pseudoephedrine Hcl Er Shortness Of Breath  . Budesonide-Formoterol Fumarate Other (See Comments)    Blisters inside of mouth all over  . Crestor [Rosuvastatin Calcium] Other (See Comments)    Unable to walk  . Esomeprazole Magnesium Other (See Comments)    REACTION: "bouncing off walls"  . Flonase [Fluticasone Propionate] Other (See Comments)    NOSE BLEED  . Loratadine Other (See Comments)    claritin D causes shaking  . Lotrimin [Clotrimazole] Other (See Comments)    Mouth blisters  . Lunesta [Eszopiclone] Other (See Comments)    REACTION: "slept for a week"  . Oxcarbazepine Other (See Comments)    Causes deep sleep and dizziness  . Statins Other (See Comments)    Can't walk, legs won't work   . Zolpidem Tartrate Other (See Comments)    REACTION: "slept for a week"  . Betadine [Povidone Iodine] Other (See Comments)    Breathing problems  . Clarithromycin Other (See Comments)    All "mycins", Puts into "a" fib, Will take if has to for severe sinus infection  . Effexor [Venlafaxine] Nausea And Vomiting and Other (See Comments)    cramps  . Lexapro [Escitalopram Oxalate] Other (See Comments)    hallucinations  . Aciphex [Rabeprazole Sodium] Rash  . Avelox [Moxifloxacin Hcl In Nacl] Other (See Comments)    Stomach cramps.   Darlin Coco [Valdecoxib] Rash  . Ceclor [Cefaclor] Rash  . Cephalexin Rash and  Other (See Comments)    Pt states that she is possibly allergic to this - had a reaction to Cefaclor in the past and she does not want to these class drugs. Added per patient request.  . Covera-Hs [Verapamil Hcl] Palpitations  . Dicyclomine Hcl Rash  . Other Other (See Comments)    Glue from ekg/heart monitor leads --rash, Any MYCINS  . 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 The patient received 300 mg of by mouth Plavix. The sheath will be removed once a state he falls below 170 pressure held. The patient be hydrated overnight and discharged home in the morning and she will obtain lower extremity until Doppler studies in our [facility]  office next week and I will see her back 2-3  weeks thereafter. She left the lab in stable condition.  [name]. MD, [facility] _____________   History of Present Illness      72 y.o. female that was referred to Dr. Gwenlyn Found by Dr. Martinique for peripheral vascular evaluation because of limiting claudication. She does have a history of CAD status post right left heart cath performed 03/04/17 revealing severe 2 vessel obstructive CAD with occluded RCA and circumflex marginal disease. 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 She has a history of hypertension and hyperlipidemia. She also  has fibromyalgia. She complained of left greater than right lower extremity claudication which is lifestyle limiting at less than a block. Lower extremity arterial Dopplers performed 07/28/17 revealed a right ABI 0.9 and a leftof .82. 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 She did see Dr. Trula Slade for consideration of aortobifemoral bypass grafting although she was evaluated by Dr. [name] who did not feel that she was adequate pulmonary candidate to undergo general anesthesia. She was referred back to Dr. Gwenlyn Found for endovascular therapy.  Hospital Course     Underwent successful diamondback rotational atherectomy with PTA cover stenting of the left common iliac artery. Plan for DAPT with Coumadin/plavix. Post cath labs were stable. No complications noted post cath. Able to ambulate without complication. Of note she was on a Lovenox/coumadin bridge prior to admission. Does have follow up in the coumadin clinic for 3/22 for INR recheck. INR was 3/18 1.03 on 3/18.   General: Well developed, well nourished, female appearing in no acute distress. Head: Normocephalic, atraumatic.  Neck: Supple without bruits, JVD. Lungs:  Resp regular and unlabored, CTA. Heart: RRR, S1, S2, no S3, S4, or murmur; no rub. Abdomen: Soft, non-tender, non-distended with normoactive bowel sounds. No hepatomegaly. No rebound/guarding. No obvious abdominal masses. Extremities: No clubbing, cyanosis, edema. Distal pedal pulses are 2+ bilaterally. L femoral cath site stable with bruising but no hematoma Neuro: Alert and oriented X 3. Moves all extremities spontaneously. Psych: Normal affect.  Laurie Horton was seen by Dr. Gwenlyn Found and determined stable for discharge home. Follow up in the office has been arranged. Medications are listed below.   _____________  Discharge Vitals Blood pressure (!) 142/67, pulse 95, temperature 98.6 F (37 C), temperature source Oral, resp. rate 18, height 5\' 3"  (1.6 m), weight 180 lb 12.4 oz (82 kg), SpO2 94%.  Filed Weights   01/17/18 0549 01/18/18 0239  Weight: 185 lb (83.9 kg) 180 lb 12.4 oz (82 kg)    Labs & Radiologic Studies    CBC Recent Labs    01/18/18 0614  WBC 8.5    HGB 11.9*  HCT 36.6  MCV 91.3  PLT 976   Basic Metabolic Panel Recent Labs    01/18/18 0614  NA 138  K 3.9  CL 100*  CO2 26  GLUCOSE 115*  BUN 14  CREATININE 0.97  CALCIUM 10.0   Liver Function Tests No results for input(s): AST, ALT, ALKPHOS, BILITOT, PROT, ALBUMIN in the last 72 hours. No results for input(s): LIPASE, AMYLASE in the last 72 hours. Cardiac Enzymes No results for input(s): CKTOTAL, CKMB, CKMBINDEX, TROPONINI in the last 72 hours. BNP Invalid input(s): POCBNP D-Dimer No results for input(s): DDIMER in the last 72 hours. Hemoglobin A1C No results for input(s): HGBA1C in the last 72 hours. Fasting Lipid Panel No results for input(s): CHOL, HDL, LDLCALC, TRIG, CHOLHDL, LDLDIRECT in the last 72 hours. Thyroid Function Tests No results for input(s): TSH, T4TOTAL, T3FREE, THYROIDAB in the last 72 hours.  Invalid input(s): FREET3 _____________  No results found. Disposition   Pt is being discharged home today in good condition.  Follow-up Plans & Appointments    Follow-up Information    Lorretta Harp, MD Follow up on 02/02/2018.   Specialties:  Cardiology, Radiology Why:  at 9:15am for your follow up appt.  Contact information: [address] [place] [address] [phone]        [facility] Follow up on 01/21/2018.   Specialty:  Cardiology Why:  Please keep your follow up INR appt.   The office will call you with follow up dopplers.  Contact information: [address] [place] [phone]         Discharge Instructions    Diet - low sodium heart healthy   Complete by:  As directed    Discharge instructions   Complete by:  As directed    Groin Site Care Refer to this sheet in the next few weeks. These instructions provide you with information on caring for yourself after your procedure. Your caregiver may also give you more specific instructions. Your treatment  has been planned according to current medical practices, but problems sometimes occur. Call your caregiver if you have any problems or questions after your procedure. HOME CARE INSTRUCTIONS You may shower 24 hours after the procedure. Remove the bandage (dressing) and gently wash the site with plain soap and water. Gently pat the site dry.  Do not apply powder or lotion to the site.  Do not sit in a bathtub, swimming pool, or whirlpool for 5 to 7 days.  No bending, squatting, or lifting anything over 10 pounds (4.5 kg) as directed by your caregiver.  Inspect the site at least twice daily.  Do not drive home if you are discharged the same day of the procedure. Have someone else drive you.  You may drive 24 hours after the procedure unless otherwise instructed by your caregiver.  What to expect: Any bruising will usually fade within 1 to 2 weeks.  Blood that collects in the tissue (hematoma) may be painful to the touch. It should usually decrease in size and tenderness within 1 to 2 weeks.  SEEK IMMEDIATE MEDICAL CARE IF: You have unusual pain at the groin site or down the affected leg.  You have redness, warmth, swelling, or pain at the groin site.  You have drainage (other than a small amount of blood on the dressing).  You have chills.  You have a fever or persistent symptoms for more than 72 hours.  You have a fever and your symptoms suddenly get worse.  Your leg becomes pale, cool, tingly, or numb.  You have heavy bleeding from the site. Hold pressure on the site.   Please keep your scheduled follow up appt on 3/22 to recheck your INR. We have added plavix this admission. Please monitor for signs/symptoms of bleeding with these medications.   Increase activity slowly   Complete by:  As directed      Discharge Medications     Medication List    TAKE these medications   carvedilol 12.5 MG tablet Commonly known as:  COREG Take 1 tablet (12.5 mg total) by mouth 2 (two) times  daily.   clopidogrel 75 MG tablet Commonly known as:  PLAVIX Take 1 tablet (75 mg total) by mouth daily with breakfast. Start taking on:  01/19/2018   desvenlafaxine 50 MG 24 hr tablet Commonly known as:  PRISTIQ Take 1 tablet (50 mg total) by mouth daily.   diazepam 2 MG tablet Commonly known as:  VALIUM Take 1 tablet (2 mg total) by mouth daily as needed for anxiety.   enoxaparin 120 MG/0.8ML injection Commonly known as:  LOVENOX Inject 0.8 mLs (120 mg  total) into the skin daily.   furosemide 40 MG tablet Commonly known as:  LASIX Take 1 tablet (40 mg total) by mouth daily.   gabapentin 100 MG capsule Commonly known as:  NEURONTIN Take 100-200 mg by mouth See admin instructions. 100 mg in am, 200 mg at noon and  200 mg at bedtime   HYDROcodone-acetaminophen 5-325 MG tablet Commonly known as:  NORCO/VICODIN Take 1 tablet by mouth 3 (three) times daily.   ipratropium 0.02 % nebulizer solution Commonly known as:  ATROVENT USE 1 VIAL BY NEBULIZATION 4 (FOUR) TIMES DAILY. What changed:  See the new instructions.   losartan 25 MG tablet Commonly known as:  COZAAR Take 1 tablet (25 mg total) by mouth daily.   montelukast 10 MG tablet Commonly known as:  SINGULAIR TAKE 1 TABLET BY MOUTH EVERYDAY AT BEDTIME What changed:  See the new instructions.   ondansetron 8 MG disintegrating tablet Commonly known as:  ZOFRAN ODT Take 1 tablet (8 mg total) by mouth every 8 (eight) hours as needed for nausea or vomiting.   oxybutynin 10 MG 24 hr tablet Commonly known as:  DITROPAN-XL TAKE 1 TABLET BY MOUTH EVERY DAY What changed:    how much to take  how to take this  when to take this   OXYGEN Inhale 1.5-2 L into the lungs as needed (for shortness of breath).   pantoprazole 40 MG tablet Commonly known as:  PROTONIX Take 40 mg by mouth daily.   polyethylene glycol packet Commonly known as:  MIRALAX / GLYCOLAX Take 17 g by mouth every other day.   rosuvastatin 5 MG  tablet Commonly known as:  CRESTOR Take 1 tablet (5 mg total) by mouth 2 (two) times a week. Sunday and Wednesday   spironolactone 25 MG tablet Commonly known as:  ALDACTONE Take 1 tablet (25 mg total) by mouth daily.   tiZANidine 2 MG tablet Commonly known as:  ZANAFLEX Take 2 mg by mouth at bedtime.   traZODone 100 MG tablet Commonly known as:  DESYREL Take 1 tablet (100 mg total) by mouth at bedtime.   triamcinolone ointment 0.1 % Commonly known as:  KENALOG Apply 1 application topically at bedtime as needed (for right ear irritation).   warfarin 5 MG tablet Commonly known as:  COUMADIN Take as directed. If you are unsure how to take this medication, talk to your nurse or doctor. Original instructions:  TAKE 1/2 TO 1 TABLET BY MOUTH EVERY DAY OR AS DIRECTED BY COUMADIN CLINIC What changed:    how much to take  how to take this  when to take this  additional instructions        Outstanding Labs/Studies   INR check 3/22  Duration of Discharge Encounter   Greater than 30 minutes including physician time.  Signed, Reino Bellis NP-C 01/18/2018, 9:33 AM

## 2018-01-17 NOTE — Interval H&P Note (Signed)
History and Physical Interval Note:  01/17/2018 7:52 AM  Laurie Horton  has presented today for surgery, with the diagnosis of claudication  The various methods of treatment have been discussed with the patient and family. After consideration of risks, benefits and other options for treatment, the patient has consented to  Procedure(s): LOWER EXTREMITY INTERVENTION (Left) as a surgical intervention .  The patient's history has been reviewed, patient examined, no change in status, stable for surgery.  I have reviewed the patient's chart and labs.  Questions were answered to the patient's satisfaction.     [name]

## 2018-01-17 NOTE — Progress Notes (Signed)
Site area: left groin fa sheath Site Prior to Removal:  Level 0 Pressure Applied For:  25 minutes Manual:   yes Patient Status During Pull:  stable Post Pull Site:  Level  0 Post Pull Instructions Given:  yes Post Pull Pulses Present: palpable left dp Dressing Applied:  Gauze and tegaderm Bedrest begins @ 3154 Comments:

## 2018-01-18 ENCOUNTER — Other Ambulatory Visit: Payer: Self-pay | Admitting: Cardiology

## 2018-01-18 DIAGNOSIS — I739 Peripheral vascular disease, unspecified: Secondary | ICD-10-CM

## 2018-01-18 DIAGNOSIS — I70212 Atherosclerosis of native arteries of extremities with intermittent claudication, left leg: Secondary | ICD-10-CM | POA: Diagnosis not present

## 2018-01-18 LAB — BASIC METABOLIC PANEL
Anion gap: 12 (ref 5–15)
BUN: 14 mg/dL (ref 6–20)
CALCIUM: 10 mg/dL (ref 8.9–10.3)
CHLORIDE: 100 mmol/L — AB (ref 101–111)
CO2: 26 mmol/L (ref 22–32)
Creatinine, Ser: 0.97 mg/dL (ref 0.44–1.00)
GFR calc non Af Amer: 57 mL/min — ABNORMAL LOW (ref >60)
GLUCOSE: 115 mg/dL — AB (ref 65–99)
Potassium: 3.9 mmol/L (ref 3.5–5.1)
Sodium: 138 mmol/L (ref 135–145)

## 2018-01-18 LAB — CBC
HCT: 36.6 % (ref 36.0–46.0)
Hemoglobin: 11.9 g/dL — ABNORMAL LOW (ref 12.0–15.0)
MCH: 29.7 pg (ref 26.0–34.0)
MCHC: 32.5 g/dL (ref 30.0–36.0)
MCV: 91.3 fL (ref 78.0–100.0)
Platelets: 213 10*3/uL (ref 150–400)
RBC: 4.01 MIL/uL (ref 3.87–5.11)
RDW: 14.9 % (ref 11.5–15.5)
WBC: 8.5 10*3/uL (ref 4.0–10.5)

## 2018-01-18 MED ORDER — CLOPIDOGREL BISULFATE 75 MG PO TABS: 75.0000 mg | ORAL_TABLET | Freq: Every day | ORAL | 1 refills | Status: DC

## 2018-01-18 MED FILL — Heparin Sodium (Porcine) 2 Unit/ML in Sodium Chloride 0.9%: INTRAMUSCULAR | Qty: 2000 | Status: AC

## 2018-01-18 MED FILL — Nitroglycerin IV Soln 100 MCG/ML in D5W: INTRA_ARTERIAL | Qty: 10 | Status: AC

## 2018-01-18 MED FILL — Heparin Sodium (Porcine) Inj 1000 Unit/ML: INTRAMUSCULAR | Qty: 10 | Status: AC

## 2018-01-18 MED FILL — Lidocaine HCl Local Inj 1%: INTRAMUSCULAR | Qty: 20 | Status: AC

## 2018-01-20 ENCOUNTER — Telehealth: Payer: Self-pay | Admitting: Cardiovascular Disease

## 2018-01-20 NOTE — Telephone Encounter (Signed)
Left message to call back  

## 2018-01-20 NOTE — Telephone Encounter (Signed)
Follow Up:    Calling again.

## 2018-01-20 NOTE — Telephone Encounter (Signed)
F/U call: ° °Patient returning your call  °

## 2018-01-20 NOTE — Telephone Encounter (Signed)
Returned the call to the patient. She is post angiogram from 01/17/18. She stated that when she ambulates too far that she starts having cramps. She has been instructed to slow down and not to exert herself. She did state that it gets better when she relaxes. She has been instructed to call back if the cramping does not get better.   She has an appointment tomorrow with the coumadin clinic to go over her current medication.

## 2018-01-20 NOTE — Telephone Encounter (Addendum)
New message:    Pt is calling due to reading up on her notes after her procedure and it states she shouldn't be taking plavix and enoxaparin (LOVENOX) 120 MG/0.8ML injection and warfarin (COUMADIN) 5 MG tablet and it states she shouldn't be taking all of these at one time. Pls call pt back to advise what she should and should not be taking. Pt is also having cramping at the patella.

## 2018-01-21 ENCOUNTER — Ambulatory Visit (INDEPENDENT_AMBULATORY_CARE_PROVIDER_SITE_OTHER): Payer: Medicare Other | Admitting: Pharmacist Clinician (PhC)/ Clinical Pharmacy Specialist

## 2018-01-21 DIAGNOSIS — I4891 Unspecified atrial fibrillation: Secondary | ICD-10-CM

## 2018-01-21 DIAGNOSIS — I480 Paroxysmal atrial fibrillation: Secondary | ICD-10-CM | POA: Diagnosis not present

## 2018-01-21 DIAGNOSIS — Z7901 Long term (current) use of anticoagulants: Secondary | ICD-10-CM | POA: Diagnosis not present

## 2018-01-21 LAB — POCT INR: INR: 1.1

## 2018-01-26 ENCOUNTER — Ambulatory Visit (INDEPENDENT_AMBULATORY_CARE_PROVIDER_SITE_OTHER): Payer: Medicare Other | Admitting: Pharmacist

## 2018-01-26 DIAGNOSIS — I480 Paroxysmal atrial fibrillation: Secondary | ICD-10-CM | POA: Diagnosis not present

## 2018-01-26 DIAGNOSIS — Z7901 Long term (current) use of anticoagulants: Secondary | ICD-10-CM

## 2018-01-26 LAB — POCT INR: INR: 1.8

## 2018-01-28 ENCOUNTER — Ambulatory Visit (HOSPITAL_COMMUNITY)
Admit: 2018-01-28 | Discharge: 2018-01-28 | Disposition: A | Discharge status: Home / Self Care | Payer: Medicare Other | Attending: Cardiology | Admitting: Cardiology

## 2018-01-28 ENCOUNTER — Ambulatory Visit (HOSPITAL_BASED_OUTPATIENT_CLINIC_OR_DEPARTMENT_OTHER)
Admit: 2018-01-28 | Discharge: 2018-01-28 | Disposition: A | Discharge status: Home / Self Care | Payer: Medicare Other | Attending: Cardiology | Admitting: Cardiology

## 2018-01-28 ENCOUNTER — Ambulatory Visit (INDEPENDENT_AMBULATORY_CARE_PROVIDER_SITE_OTHER): Payer: Medicare Other | Admitting: Pharmacist

## 2018-01-28 DIAGNOSIS — I739 Peripheral vascular disease, unspecified: Secondary | ICD-10-CM | POA: Insufficient documentation

## 2018-01-28 DIAGNOSIS — Z7901 Long term (current) use of anticoagulants: Secondary | ICD-10-CM | POA: Diagnosis not present

## 2018-01-28 DIAGNOSIS — I480 Paroxysmal atrial fibrillation: Secondary | ICD-10-CM | POA: Diagnosis not present

## 2018-01-28 LAB — POCT INR: INR: 2.1

## 2018-02-02 ENCOUNTER — Ambulatory Visit (INDEPENDENT_AMBULATORY_CARE_PROVIDER_SITE_OTHER): Payer: Medicare Other | Admitting: Cardiovascular Disease

## 2018-02-02 ENCOUNTER — Encounter: Payer: Self-pay | Admitting: Cardiovascular Disease

## 2018-02-02 VITALS — BP 134/80 | HR 76 | Ht 62.0 in | Wt 190.0 lb

## 2018-02-02 DIAGNOSIS — I739 Peripheral vascular disease, unspecified: Secondary | ICD-10-CM | POA: Diagnosis not present

## 2018-02-02 NOTE — Assessment & Plan Note (Signed)
Laurie Horton returns today for post hospital follow-up. I performed on the back orbital rotational atherectomy uncovered stenting of a highly calcified high-grade left common iliac artery stenosis using a 7 mm x 39 mm long VBX covered stent. Her left ABI improved from 0.8 to the point 911 and her claudication markedly improved now allowing her to walk without significant claudication. She does have residual high-grade calcified right common femoral artery stenosis which would only be fixable surgically as well as a high-grade calcified infrarenal abdominal aortic stenosis on a bend which can only be treated surgically and she is not a surgical candidate.

## 2018-02-02 NOTE — Progress Notes (Signed)
02/02/2018 RAGHAD LORENZ   06-30-1946  [id number]  Primary Physician Wardell Honour, MD Primary Cardiologist: Lorretta Harp MD Garret Reddish, [place], [place]  HPI:  Laurie Horton is a 72 y.o.  divorced Caucasian female mother of 62, grandmother of 53 grandchildren referred to me by Dr. Martinique for peripheral vascular evaluation because of limiting claudication.I last saw her in the office 12/24/17.Marland Kitchen Marland KitchenShe does have a history of CAD status post right left heart cath performed 03/04/17 revealing severe 2 vessel obstructive CAD with occluded RCA and circumflex marginal disease. 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 She has a history of hypertension and hyperlipidemia. She also has fibromyalgia. She complains of left greater than right lower extremity claudication which is lifestyle limiting at less than a block. Lower extremity arterial Dopplers performed 07/28/17 revealed a right ABI 0.9 and a leftof .82. I perform abdominal aortography with bifemoral runoff 09/09/17 revealing an 80% calcified mid infrarenal abdominal aortic stenosis on a bend with a 25 mm gradient, 80% calcified left common iliac artery stenosis and what appeared to be a high-grade calcified right common femoral artery stenosis. She did see Dr. Trula Slade for consideration of aortobifemoral bypass grafting although she was evaluated by Dr. [name] who did not feel that she was adequate pulmonary candidate to undergo general anesthesia. She was referred back here for endovascular therapy which I performed successfully on 01/17/18. I performed on that orbital rotational atherectomy, PTA and Stenting using VBX covered stent of a high-grade highly calcified left common iliac artery stenosis. Her lower extremity is procedure Dopplers were revealed significant improvement with a left ABI of 0.85 which improved up to 0.91. Her claudication markedly improved as well.     Current Meds  Medication Sig    . carvedilol (COREG) 12.5 MG tablet Take 1 tablet (12.5 mg total) by mouth 2 (two) times daily.  . clopidogrel (PLAVIX) 75 MG tablet Take 1 tablet (75 mg total) by mouth daily with breakfast.  . desvenlafaxine (PRISTIQ) 50 MG 24 hr tablet Take 1 tablet (50 mg total) by mouth daily.  . diazepam (VALIUM) 2 MG tablet Take 1 tablet (2 mg total) by mouth daily as needed for anxiety.  . enoxaparin (LOVENOX) 120 MG/0.8ML injection Inject 0.8 mLs (120 mg total) into the skin daily.  . furosemide (LASIX) 40 MG tablet Take 1 tablet (40 mg total) by mouth daily.  Marland Kitchen gabapentin (NEURONTIN) 100 MG capsule Take 100-200 mg by mouth See admin instructions. 100 mg in am, 200 mg at noon and  200 mg at bedtime  . HYDROcodone-acetaminophen (NORCO/VICODIN) 5-325 MG tablet Take 1 tablet by mouth 3 (three) times daily.   Marland Kitchen ipratropium (ATROVENT) 0.02 % nebulizer solution USE 1 VIAL BY NEBULIZATION 4 (FOUR) TIMES DAILY. (Patient taking differently: USE 1 VIAL BY NEBULIZATION  3 TIMES DAILY AS NEEDED FOR SHORTNESS OF BREATH)  . losartan (COZAAR) 25 MG tablet Take 1 tablet (25 mg total) by mouth daily.  . montelukast (SINGULAIR) 10 MG tablet TAKE 1 TABLET BY MOUTH EVERYDAY AT BEDTIME (Patient taking differently: TAKE 10 MG BY MOUTH EVERYDAY)  . ondansetron (ZOFRAN ODT) 8 MG disintegrating tablet Take 1 tablet (8 mg total) by mouth every 8 (eight) hours as needed for nausea or vomiting.  Marland Kitchen oxybutynin (DITROPAN-XL) 10 MG 24 hr tablet TAKE 1 TABLET BY MOUTH EVERY DAY (Patient taking differently: TAKE 10 MG BY MOUTH EVERY DAY)  . OXYGEN Inhale 1.5-2 L into the lungs as  needed (for shortness of breath).  . pantoprazole (PROTONIX) 40 MG tablet Take 40 mg by mouth daily.   . polyethylene glycol (MIRALAX / GLYCOLAX) packet Take 17 g by mouth every other day.   . rosuvastatin (CRESTOR) 5 MG tablet Take 1 tablet (5 mg total) by mouth 2 (two) times a week. Sunday and Wednesday  . spironolactone (ALDACTONE) 25 MG tablet Take 1 tablet  (25 mg total) by mouth daily.  Marland Kitchen tiZANidine (ZANAFLEX) 2 MG tablet Take 2 mg by mouth at bedtime.   . traZODone (DESYREL) 100 MG tablet Take 1 tablet (100 mg total) by mouth at bedtime.  . triamcinolone ointment (KENALOG) 0.1 % Apply 1 application topically at bedtime as needed (for right ear irritation).   . warfarin (COUMADIN) 5 MG tablet TAKE 1/2 TO 1 TABLET BY MOUTH EVERY DAY OR AS DIRECTED BY COUMADIN CLINIC (Patient taking differently: Take 2.5-5 mg by mouth See admin instructions. Take 5 mg by mouth daily on Monday and Friday. Take 2.5 mg by mouth daily on all other days.)     Allergies  Allergen Reactions  . Alprazolam Anaphylaxis and Other (See Comments)    REACTION: stops breathing  . Bee Venom Anaphylaxis  . Iodine Anaphylaxis, Swelling and Other (See Comments)    REACTION: swelling in throat  . Pseudoephedrine Hcl Er Shortness Of Breath  . Budesonide-Formoterol Fumarate Other (See Comments)    Blisters inside of mouth all over  . Crestor [Rosuvastatin Calcium] Other (See Comments)    Unable to walk  . Esomeprazole Magnesium Other (See Comments)    REACTION: "bouncing off walls"  . Flonase [Fluticasone Propionate] Other (See Comments)    NOSE BLEED  . Loratadine Other (See Comments)    claritin D causes shaking  . Lotrimin [Clotrimazole] Other (See Comments)    Mouth blisters  . Lunesta [Eszopiclone] Other (See Comments)    REACTION: "slept for a week"  . Oxcarbazepine Other (See Comments)    Causes deep sleep and dizziness  . Statins Other (See Comments)    Can't walk, legs won't work   . Zolpidem Tartrate Other (See Comments)    REACTION: "slept for a week"  . Betadine [Povidone Iodine] Other (See Comments)    Breathing problems  . Clarithromycin Other (See Comments)    All "mycins", Puts into "a" fib, Will take if has to for severe sinus infection  . Effexor [Venlafaxine] Nausea And Vomiting and Other (See Comments)    cramps  . Lexapro [Escitalopram Oxalate]  Other (See Comments)    hallucinations  . Aciphex [Rabeprazole Sodium] Rash  . Avelox [Moxifloxacin Hcl In Nacl] Other (See Comments)    Stomach cramps.   Darlin Coco [Valdecoxib] Rash  . Ceclor [Cefaclor] Rash  . Cephalexin Rash and Other (See Comments)    Pt states that she is possibly allergic to this - had a reaction to Cefaclor in the past and she does not want to these class drugs. Added per patient request.  . Covera-Hs [Verapamil Hcl] Palpitations  . Dicyclomine Hcl Rash  . Other Other (See Comments)    Glue from ekg/heart monitor leads --rash, Any MYCINS  . Tessalon Perles Rash    Social History   Socioeconomic History  . Marital status: Divorced    Spouse name: Not on file  . Number of children: 2  . Years of education: Not on file  . Highest education level: Not on file  Occupational History  . Occupation: disability  Social Needs  .  Financial resource strain: Not on file  . Food insecurity:    Worry: Not on file    Inability: Not on file  . Transportation needs:    Medical: Not on file    Non-medical: Not on file  Tobacco Use  . Smoking status: Former Smoker    Last attempt to quit: 06/02/2010    Years since quitting: 7.6  . Smokeless tobacco: Never Used  . Tobacco comment: QUIT IN 2011  Substance and Sexual Activity  . Alcohol use: No    Alcohol/week: 0.0 oz  . Drug use: No  . Sexual activity: Yes    Partners: Male    Birth control/protection: None  Lifestyle  . Physical activity:    Days per week: Not on file    Minutes per session: Not on file  . Stress: Not on file  Relationships  . Social connections:    Talks on phone: Not on file    Gets together: Not on file    Attends religious service: Not on file    Active member of club or organization: Not on file    Attends meetings of clubs or organizations: Not on file    Relationship status: Not on file  . Intimate partner violence:    Fear of current or ex partner: Not on file    Emotionally  abused: Not on file    Physically abused: Not on file    Forced sexual activity: Not on file  Other Topics Concern  . Not on file  Social History Narrative   Marital status: divorced in 1978 after ten years; dating casually      Children: 3 biological children; 3 court appointed children; 6 grandchildren; 5 gg      Lives: alone; children in [place] and [place].      Employment: retired age 47; disability for COPD, CVA at age 36.      Tobacco: former smoker; quit smoking 2011.      Alcohol: none      Exercise: walks dog four times per day; goes to pool three times per week.       ADLs: drives; no assistant devices; does have a walker.  Cleaning is limited in 2018.  Daughter helps with cleaning.  Does own grocery shopping.      Advanced Directives: YES; DNR/DNI; HCPOA:            Review of Systems: General: negative for chills, fever, night sweats or weight changes.  Cardiovascular: negative for chest pain, dyspnea on exertion, edema, orthopnea, palpitations, paroxysmal nocturnal dyspnea or shortness of breath Dermatological: negative for rash Respiratory: negative for cough or wheezing Urologic: negative for hematuria Abdominal: negative for nausea, vomiting, diarrhea, bright red blood per rectum, melena, or hematemesis Neurologic: negative for visual changes, syncope, or dizziness All other systems reviewed and are otherwise negative except as noted above.    Blood pressure 134/80, pulse 76, height 5\' 2"  (1.575 m), weight 190 lb (86.2 kg).  General appearance: alert and no distress Neck: no adenopathy, no carotid bruit, no JVD, supple, symmetrical, trachea midline and thyroid not enlarged, symmetric, no tenderness/mass/nodules Lungs: clear to auscultation bilaterally Heart: regular rate and rhythm, S1, S2 normal, no murmur, click, rub or gallop Extremities: extremities normal, atraumatic, no cyanosis or edema Pulses: 2+ and symmetric Skin: moderate distal abdominal  ecchymosis. Neurologic: Alert and oriented X 3, normal strength and tone. Normal symmetric reflexes. Normal coordination and gait  EKG not performed today  ASSESSMENT AND PLAN:  Claudication in peripheral vascular disease [facility]) Ms. Marron returns today for post hospital follow-up. I performed on the back orbital rotational atherectomy uncovered stenting of a highly calcified high-grade left common iliac artery stenosis using a 7 mm x 39 mm long VBX covered stent. Her left ABI improved from 0.8 to the point 911 and her claudication markedly improved now allowing her to walk without significant claudication. She does have residual high-grade calcified right common femoral artery stenosis which would only be fixable surgically as well as a high-grade calcified infrarenal abdominal aortic stenosis on a bend which can only be treated surgically and she is not a surgical candidate.      Lorretta Harp MD FACP,FACC,FAHA, [facility] 02/02/2018 9:08 AM

## 2018-02-02 NOTE — Patient Instructions (Signed)
Medication Instructions: Your physician recommends that you continue on your current medications as directed. Please refer to the Current Medication list given to you today.  Testing/Procedures:  In 6 months (October): Your physician has requested that you have a aorta and iliac duplex. During this test, an ultrasound is used to evaluate blood flow to the aorta and iliac arteries. Allow one hour for this exam. Do not eat after midnight the day before and avoid carbonated beverages.   Your physician has requested that you have an ankle brachial index (ABI). During this test an ultrasound and blood pressure cuff are used to evaluate the arteries that supply the arms and legs with blood. Allow thirty minutes for this exam. There are no restrictions or special instructions.  Follow-Up: Your physician wants you to follow-up in: 1 year with Dr. Gwenlyn Found. You will receive a reminder letter in the mail two months in advance. If you don't receive a letter, please call our office to schedule the follow-up appointment.  If you need a refill on your cardiac medications before your next appointment, please call your pharmacy.

## 2018-02-03 ENCOUNTER — Other Ambulatory Visit (HOSPITAL_COMMUNITY): Payer: Self-pay | Admitting: Psychiatry

## 2018-02-03 DIAGNOSIS — F0630 Mood disorder due to known physiological condition, unspecified: Secondary | ICD-10-CM

## 2018-02-08 ENCOUNTER — Ambulatory Visit: Payer: Self-pay | Admitting: Cardiovascular Disease

## 2018-02-23 ENCOUNTER — Ambulatory Visit (INDEPENDENT_AMBULATORY_CARE_PROVIDER_SITE_OTHER): Payer: Medicare Other | Admitting: Psychiatry

## 2018-02-23 ENCOUNTER — Encounter (HOSPITAL_COMMUNITY): Payer: Self-pay | Admitting: Psychiatry

## 2018-02-23 VITALS — HR 52 | Ht 62.0 in | Wt 190.4 lb

## 2018-02-23 DIAGNOSIS — M549 Dorsalgia, unspecified: Secondary | ICD-10-CM | POA: Diagnosis not present

## 2018-02-23 DIAGNOSIS — F411 Generalized anxiety disorder: Secondary | ICD-10-CM

## 2018-02-23 DIAGNOSIS — Z736 Limitation of activities due to disability: Secondary | ICD-10-CM | POA: Diagnosis not present

## 2018-02-23 DIAGNOSIS — Z810 Family history of intellectual disabilities: Secondary | ICD-10-CM

## 2018-02-23 DIAGNOSIS — F331 Major depressive disorder, recurrent, moderate: Secondary | ICD-10-CM

## 2018-02-23 DIAGNOSIS — M2550 Pain in unspecified joint: Secondary | ICD-10-CM | POA: Diagnosis not present

## 2018-02-23 DIAGNOSIS — Z87891 Personal history of nicotine dependence: Secondary | ICD-10-CM | POA: Diagnosis not present

## 2018-02-23 DIAGNOSIS — Z818 Family history of other mental and behavioral disorders: Secondary | ICD-10-CM

## 2018-02-23 MED ORDER — TRAZODONE HCL 100 MG PO TABS: 100.0000 mg | ORAL_TABLET | Freq: Every day | ORAL | 2 refills | Status: DC

## 2018-02-23 MED ORDER — DESVENLAFAXINE SUCCINATE ER 50 MG PO TB24: 50.0000 mg | ORAL_TABLET | Freq: Every day | ORAL | 2 refills | Status: DC

## 2018-02-23 MED ORDER — DIAZEPAM 2 MG PO TABS: 2.0000 mg | ORAL_TABLET | Freq: Every day | ORAL | 0 refills | Status: DC | PRN

## 2018-02-23 NOTE — Progress Notes (Signed)
BH MD/PA/NP OP Progress Note  02/23/2018 8:32 AM Laurie Horton  MRN:  [id number]  Chief Complaint: I am walking better.  I have a procedure last month and it helps my pain and I am sleeping better.  HPI: Patient came for her follow-up appointment.  Last month she has a procedure by cardiologist to help her circulation and she is feeling much better.  Her sleep and walking is improved.  She is also feeling less pain while walking.  She is anxious today because her sister-in-law recently diagnosed with lung cancer with metastasis to the bone.  Patient told she is only one who she has been in touch since her brother died many years ago.  Patient has no contact with her 84 year old son who lives in [place] and she barely talk to her 2 daughters because she report their influence caused a lot of issues in her personal life.  Patient has a good support from her friends and neighbors.  She is sad because her sister-in-law may not live long enough.  Last night she was very anxious and she took 1 Valium.  We have prescribed 10 Valium back in November and she still have few left.  Overall he describe her mood is stable.  She denies any irritability, anger, mania, psychosis, hallucination.  She has no tremors or shakes.  She like Pristiq and trazodone.  Patient denies any hallucination, paranoia or any suicidal thoughts.  Energy level is good.  Visit Diagnosis:    ICD-10-CM   1. Generalized anxiety disorder F41.1 diazepam (VALIUM) 2 MG tablet  2. MDD (major depressive disorder), recurrent episode, moderate (HCC) F33.1 traZODone (DESYREL) 100 MG tablet    desvenlafaxine (PRISTIQ) 50 MG 24 hr tablet    Past Psychiatric History: Reviewed Patient denies any previous history of psychiatric inpatient treatment, suicidal attempt, paranoia, hallucination or any mania. She started seeing psychiatrist in 2006 after she had a stroke. She had tried Cymbalta, lexapro and Celexa in the past with limited response.  She tried Ambien, Lunesta, lexapro and Xanax but developed allergies and side effects. We tried increasing Pristiq but she couldn't handle the side effects  Past Medical History:  Past Medical History:  Diagnosis Date  . CHF (congestive heart failure) ([place]) 03/02/2017   EF 25-30% 2018  . Complication of anesthesia    various issues with oxygen  saturations post op  . COPD (chronic obstructive pulmonary disease) ([place])   . Depression   . Diverticulitis   . DVT (deep venous thrombosis) ([place])   . Fibromyalgia   . GERD (gastroesophageal reflux disease)   . Hiatal hernia   . History of deviated nasal septum    left- side  . HTN (hypertension)   . Hyperlipidemia   . Obesity   . On supplemental oxygen therapy    concentrator at night @ 1.5 l/m or when sleeps. O2 Sat niormally 87.  . Osteoporosis   . PAT (paroxysmal atrial tachycardia) ([place])   . PFO (patent foramen ovale)   . PULMONARY NODULE, LEFT LOWER LOBE 10/14/2009   77m LLL nodule dec 2010. Stable and 453min Oct 2012. No further fu  . PVD (peripheral vascular disease) with claudication ([facility]  . Right middle lobe pneumonia ([facility]/21/2012   First noted at admit 07/10/11. Persists on cxr 07/22/11. Cleared on CT 08/24/11. No further followup  . Stroke ([facility]  . TOBACCO ABUSE 06/04/2009    Past Surgical History:  Procedure Laterality Date  . CARDIOVASCULAR STRESS TEST  12/26/2004   EF 74%. NO EVIDENCE OF ISCHEMIA  . ESOPHAGOGASTRODUODENOSCOPY (EGD) WITH PROPOFOL N/A 04/15/2015   Procedure: ESOPHAGOGASTRODUODENOSCOPY (EGD) WITH PROPOFOL;  Surgeon: Laurence Spates, MD;  Location: [facility];  Service: Endoscopy;  Laterality: N/A;  . KNEE ARTHROSCOPY  2000   left  . LAPAROSCOPIC CHOLECYSTECTOMY  04-16-2010   [place]  . LOWER EXTREMITY ANGIOGRAPHY N/A 09/09/2017   Procedure: Lower Extremity Angiography;  Surgeon: Lorretta Harp, MD;  Location: [facility];  Service: Cardiovascular;  Laterality: N/A;  . LOWER EXTREMITY  INTERVENTION Left 01/17/2018   Procedure: LOWER EXTREMITY INTERVENTION;  Surgeon: Lorretta Harp, MD;  Location: [facility];  Service: Cardiovascular;  Laterality: Left;  . MOUTH SURGERY     03-26-15 multiple extractions stitches remains  . PERIPHERAL VASCULAR INTERVENTION Left 01/17/2018   Procedure: PERIPHERAL VASCULAR INTERVENTION;  Surgeon: Lorretta Harp, MD;  Location: [facility];  Service: Cardiovascular;  Laterality: Left;  COMMON ILIAC  . RIGHT/LEFT HEART CATH AND CORONARY ANGIOGRAPHY N/A 03/04/2017   Procedure: Right/Left Heart Cath and Coronary Angiography;  Surgeon: Peter M Martinique, MD;  Location: [facility];  Service: Cardiovascular;  Laterality: N/A;  . TOTAL ABDOMINAL HYSTERECTOMY     post op needed oxygen was told "she gave them a scare"  . TUBAL LIGATION    . US ECHOCARDIOGRAPHY  11/20/2009   EF 55-60%    Family Psychiatric History: Reviewed.  Family History:  Family History  Problem Relation Age of Onset  . Dementia Mother   . Diabetes Mother   . Alzheimer's disease Mother   . Heart attack Brother 46  . Heart attack Father   . Schizophrenia Sister   . Diabetes Sister   . Tremor Sister     Social History:  Social History   Socioeconomic History  . Marital status: Divorced    Spouse name: Not on file  . Number of children: 2  . Years of education: Not on file  . Highest education level: Not on file  Occupational History  . Occupation: disability  Social Needs  . Financial resource strain: Not on file  . Food insecurity:    Worry: Not on file    Inability: Not on file  . Transportation needs:    Medical: Not on file    Non-medical: Not on file  Tobacco Use  . Smoking status: Former Smoker    Last attempt to quit: 06/02/2010    Years since quitting: 7.7  . Smokeless tobacco: Never Used  . Tobacco comment: QUIT IN 2011  Substance and Sexual Activity  . Alcohol use: No    Alcohol/week: 0.0 oz  . Drug use: No  . Sexual  activity: Yes    Partners: Male    Birth control/protection: None  Lifestyle  . Physical activity:    Days per week: Not on file    Minutes per session: Not on file  . Stress: Not on file  Relationships  . Social connections:    Talks on phone: Not on file    Gets together: Not on file    Attends religious service: Not on file    Active member of club or organization: Not on file    Attends meetings of clubs or organizations: Not on file    Relationship status: Not on file  Other Topics Concern  . Not on file  Social History Narrative   Marital status: divorced in 1978 after ten years; dating casually  Children: 3 biological children; 3 court appointed children; 6 grandchildren; 5 gg      Lives: alone; children in [place] and [place].      Employment: retired age 75; disability for COPD, CVA at age 5.      Tobacco: former smoker; quit smoking 2011.      Alcohol: none      Exercise: walks dog four times per day; goes to pool three times per week.       ADLs: drives; no assistant devices; does have a walker.  Cleaning is limited in 2018.  Daughter helps with cleaning.  Does own grocery shopping.      Advanced Directives: YES; DNR/DNI; HCPOA:           Allergies:  Allergies  Allergen Reactions  . Alprazolam Anaphylaxis and Other (See Comments)    REACTION: stops breathing  . Bee Venom Anaphylaxis  . Iodine Anaphylaxis, Swelling and Other (See Comments)    REACTION: swelling in throat  . Pseudoephedrine Hcl Er Shortness Of Breath  . Budesonide-Formoterol Fumarate Other (See Comments)    Blisters inside of mouth all over  . Crestor [Rosuvastatin Calcium] Other (See Comments)    Unable to walk  . Esomeprazole Magnesium Other (See Comments)    REACTION: "bouncing off walls"  . Flonase [Fluticasone Propionate] Other (See Comments)    NOSE BLEED  . Loratadine Other (See Comments)    claritin D causes shaking  . Lotrimin [Clotrimazole] Other (See Comments)    Mouth  blisters  . Lunesta [Eszopiclone] Other (See Comments)    REACTION: "slept for a week"  . Oxcarbazepine Other (See Comments)    Causes deep sleep and dizziness  . Statins Other (See Comments)    Can't walk, legs won't work   . Zolpidem Tartrate Other (See Comments)    REACTION: "slept for a week"  . Betadine [Povidone Iodine] Other (See Comments)    Breathing problems  . Clarithromycin Other (See Comments)    All "mycins", Puts into "a" fib, Will take if has to for severe sinus infection  . Effexor [Venlafaxine] Nausea And Vomiting and Other (See Comments)    cramps  . Lexapro [Escitalopram Oxalate] Other (See Comments)    hallucinations  . Aciphex [Rabeprazole Sodium] Rash  . Avelox [Moxifloxacin Hcl In Nacl] Other (See Comments)    Stomach cramps.   Darlin Coco [Valdecoxib] Rash  . Ceclor [Cefaclor] Rash  . Cephalexin Rash and Other (See Comments)    Pt states that she is possibly allergic to this - had a reaction to Cefaclor in the past and she does not want to these class drugs. Added per patient request.  . Covera-Hs [Verapamil Hcl] Palpitations  . Dicyclomine Hcl Rash  . Other Other (See Comments)    Glue from ekg/heart monitor leads --rash, Any MYCINS  . Tessalon Perles Rash    Metabolic Disorder Labs: Recent Results (from the past 2160 hour(s))  POCT INR     Status: None   Collection Time: 12/15/17  8:09 AM  Result Value Ref Range   INR 2.1   TSH     Status: Abnormal   Collection Time: 01/11/18 12:00 AM  Result Value Ref Range   TSH 4.560 (H) 0.450 - 4.500 uIU/mL  CBC     Status: None   Collection Time: 01/11/18 12:00 AM  Result Value Ref Range   WBC 5.0 3.4 - 10.8 x10E3/uL   RBC 4.23 3.77 - 5.28 x10E6/uL   Hemoglobin 12.4 11.1 -  15.9 g/dL   Hematocrit 37.1 34.0 - 46.6 %   MCV 88 79 - 97 fL   MCH 29.3 26.6 - 33.0 pg   MCHC 33.4 31.5 - 35.7 g/dL   RDW 15.2 12.3 - 15.4 %   Platelets 205 150 - 379 [id number]  Basic metabolic panel     Status: Abnormal    Collection Time: 01/11/18 12:00 AM  Result Value Ref Range   Glucose 111 (H) 65 - 99 mg/dL   BUN 14 8 - 27 mg/dL   Creatinine, Ser 1.07 (H) 0.57 - 1.00 mg/dL   GFR calc non Af Amer 52 (L) >59 mL/min/1.73   GFR calc Af Amer 60 >59 mL/min/1.73   BUN/Creatinine Ratio 13 12 - 28   Sodium 140 134 - 144 mmol/L   Potassium 4.6 3.5 - 5.2 mmol/L   Chloride 95 (L) 96 - 106 mmol/L   CO2 29 20 - 29 mmol/L   Calcium 9.5 8.7 - 10.3 mg/dL  Protime-INR     Status: Abnormal   Collection Time: 01/11/18 12:00 AM  Result Value Ref Range   INR 2.2 (H) 0.8 - 1.2    Comment: Reference interval is for non-anticoagulated patients. Suggested INR therapeutic range for Vitamin K antagonist therapy:    Standard Dose (moderate intensity                   therapeutic range):       2.0 - 3.0    Higher intensity therapeutic range       2.5 - 3.5    Prothrombin Time 22.3 (H) 9.1 - 12.0 sec  Protime-INR     Status: None   Collection Time: 01/17/18  6:00 AM  Result Value Ref Range   Prothrombin Time 13.4 11.4 - 15.2 seconds   INR 1.03     Comment: Performed at [facility] [address], [place], [place] [address]  POCT Activated clotting time     Status: None   Collection Time: 01/17/18  8:24 AM  Result Value Ref Range   Activated Clotting Time 235 seconds  POCT Activated clotting time     Status: None   Collection Time: 01/17/18  8:45 AM  Result Value Ref Range   Activated Clotting Time 241 seconds  POCT Activated clotting time     Status: None   Collection Time: 01/17/18  9:56 AM  Result Value Ref Range   Activated Clotting Time 180 seconds  Basic metabolic panel      Status: Abnormal   Collection Time: 01/18/18  6:14 AM  Result Value Ref Range   Sodium 138 135 - 145 mmol/L   Potassium 3.9 3.5 - 5.1 mmol/L   Chloride 100 (L) 101 - 111 mmol/L   CO2 26 22 - 32 mmol/L   Glucose, Bld 115 (H) 65 - 99 mg/dL   BUN 14 6 - 20 mg/dL   Creatinine, Ser 0.97 0.44 - 1.00 mg/dL   Calcium 10.0 8.9 - 10.3  mg/dL   GFR calc non Af Amer 57 (L) >60 mL/min   GFR calc Af Amer >60 >60 mL/min    Comment: (NOTE) The eGFR has been calculated using the CKD EPI equation. This calculation has not been validated in all clinical situations. eGFR's persistently <60 mL/min signify possible Chronic Kidney Disease.    Anion gap 12 5 - 15    Comment: Performed at [facility] [address] [place], [place] [address]  CBC  Status: Abnormal   Collection Time: 01/18/18  6:14 AM  Result Value Ref Range   WBC 8.5 4.0 - 10.5 K/uL   RBC 4.01 3.87 - 5.11 MIL/uL   Hemoglobin 11.9 (L) 12.0 - 15.0 g/dL   HCT 36.6 36.0 - 46.0 %   MCV 91.3 78.0 - 100.0 fL   MCH 29.7 26.0 - 34.0 pg   MCHC 32.5 30.0 - 36.0 g/dL   RDW 14.9 11.5 - 15.5 %   Platelets 213 150 - 400 K/uL    Comment: Performed at [facility], [place] [address], [place], [place] [address]  POCT INR     Status: None   Collection Time: 01/21/18  8:11 AM  Result Value Ref Range   INR 1.1   POCT INR     Status: None   Collection Time: 01/26/18  7:56 AM  Result Value Ref Range   INR 1.8   POCT INR     Status: None   Collection Time: 01/28/18  9:47 AM  Result Value Ref Range   INR 2.1    Lab Results  Component Value Date   HGBA1C 6.0 02/06/2016   MPG 120 (H) 05/04/2013   No results found for: PROLACTIN Lab Results  Component Value Date   CHOL 166 10/20/2017   TRIG 156 (H) 10/20/2017   HDL 36 (L) 10/20/2017   CHOLHDL 4.6 (H) 10/20/2017   VLDL 25 07/02/2016   LDLCALC 99 10/20/2017   LDLCALC 128 (H) 12/09/2016   Lab Results  Component Value Date   TSH 4.560 (H) 01/11/2018   TSH 1.990 08/31/2017    Therapeutic Level Labs: No results found for: LITHIUM No results found for: VALPROATE No components found for:  CBMZ  Current Medications: Current Outpatient Medications  Medication Sig Dispense Refill  . carvedilol (COREG) 12.5 MG tablet Take 1 tablet (12.5 mg total) by mouth 2 (two) times daily. 60 tablet 11  . clopidogrel  (PLAVIX) 75 MG tablet Take 1 tablet (75 mg total) by mouth daily with breakfast. 30 tablet 1  . desvenlafaxine (PRISTIQ) 50 MG 24 hr tablet Take 1 tablet (50 mg total) by mouth daily. 30 tablet 2  . diazepam (VALIUM) 2 MG tablet Take 1 tablet (2 mg total) by mouth daily as needed for anxiety. 10 tablet 0  . enoxaparin (LOVENOX) 120 MG/0.8ML injection Inject 0.8 mLs (120 mg total) into the skin daily. 4 Syringe 0  . furosemide (LASIX) 40 MG tablet Take 1 tablet (40 mg total) by mouth daily. 30 tablet 11  . gabapentin (NEURONTIN) 100 MG capsule Take 100-200 mg by mouth See admin instructions. 100 mg in am, 200 mg at noon and  200 mg at bedtime    . HYDROcodone-acetaminophen (NORCO/VICODIN) 5-325 MG tablet Take 1 tablet by mouth 3 (three) times daily.     Marland Kitchen ipratropium (ATROVENT) 0.02 % nebulizer solution USE 1 VIAL BY NEBULIZATION 4 (FOUR) TIMES DAILY. (Patient taking differently: USE 1 VIAL BY NEBULIZATION  3 TIMES DAILY AS NEEDED FOR SHORTNESS OF BREATH) 75 mL 3  . losartan (COZAAR) 25 MG tablet Take 1 tablet (25 mg total) by mouth daily. 30 tablet 11  . montelukast (SINGULAIR) 10 MG tablet TAKE 1 TABLET BY MOUTH EVERYDAY AT BEDTIME (Patient taking differently: TAKE 10 MG BY MOUTH EVERYDAY) 30 tablet 0  . ondansetron (ZOFRAN ODT) 8 MG disintegrating tablet Take 1 tablet (8 mg total) by mouth every 8 (eight) hours as needed for nausea or vomiting. 20 tablet 0  .  oxybutynin (DITROPAN-XL) 10 MG 24 hr tablet TAKE 1 TABLET BY MOUTH EVERY DAY (Patient taking differently: TAKE 10 MG BY MOUTH EVERY DAY) 30 tablet 0  . OXYGEN Inhale 1.5-2 L into the lungs as needed (for shortness of breath).    . pantoprazole (PROTONIX) 40 MG tablet Take 40 mg by mouth daily.     . polyethylene glycol (MIRALAX / GLYCOLAX) packet Take 17 g by mouth every other day.     . rosuvastatin (CRESTOR) 5 MG tablet Take 1 tablet (5 mg total) by mouth 2 (two) times a week. Sunday and Wednesday 24 tablet 3  . spironolactone (ALDACTONE) 25  MG tablet Take 1 tablet (25 mg total) by mouth daily. 30 tablet 11  . tiZANidine (ZANAFLEX) 2 MG tablet Take 2 mg by mouth at bedtime.     . traZODone (DESYREL) 100 MG tablet Take 1 tablet (100 mg total) by mouth at bedtime. 30 tablet 2  . triamcinolone ointment (KENALOG) 0.1 % Apply 1 application topically at bedtime as needed (for right ear irritation).     . warfarin (COUMADIN) 5 MG tablet TAKE 1/2 TO 1 TABLET BY MOUTH EVERY DAY OR AS DIRECTED BY COUMADIN CLINIC (Patient taking differently: Take 2.5-5 mg by mouth See admin instructions. Take 5 mg by mouth daily on Monday and Friday. Take 2.5 mg by mouth daily on all other days.) 90 tablet 0   No current facility-administered medications for this visit.      Musculoskeletal: Strength & Muscle Tone: within normal limits Gait & Station: normal Patient leans: N/A  Psychiatric Specialty Exam: Review of Systems  Musculoskeletal: Positive for back pain and joint pain.  Neurological: Positive for tingling.    Pulse (!) 52, height '5\' 2"'  (1.575 m), weight 190 lb 6.4 oz (86.4 kg), SpO2 (!) 87 %.Body mass index is 34.82 kg/m.  General Appearance: Casual  Eye Contact:  Good  Speech:  Normal Rate  Volume:  Normal  Mood:  Anxious  Affect:  Congruent  Thought Process:  Goal Directed  Orientation:  Full (Time, Place, and Person)  Thought Content: Logical   Suicidal Thoughts:  No  Homicidal Thoughts:  No  Memory:  Immediate;   Fair Recent;   Fair Remote;   Fair  Judgement:  Good  Insight:  Good  Psychomotor Activity:  Normal  Concentration:  Concentration: Fair and Attention Span: Fair  Recall:  Good  Fund of Knowledge: Good  Language: Good  Akathisia:  No  Handed:  Right  AIMS (if indicated): not done  Assets:  Communication Skills Desire for Improvement Housing Resilience  ADL's:  Intact  Cognition: WNL  Sleep:  Good   Screenings: PHQ2-9     Office Visit from 10/20/2017 in [facility] at [facility] from 04/28/2017  in [facility] from 02/08/2017 in [facility] at [facility] from 12/01/2016 in [facility] from 09/12/2016 in Primary Care at [facility] Total Score  0  0  0  0  0       Assessment and Plan: Major depressive disorder, recurrent.  Generalized anxiety disorder.  Patient doing better since she had surgery.  I reviewed blood work results and collateral information from other providers.  Her sleep, walking and pain is improved.  She like to continue her current medication.  Continue Pristiq 50 mg daily and trazodone 100 mg at bedtime.  Continue Valium 2 mg as needed for severe anxiety.  Patient is not interested in counseling.  Recommended to call us back if she has any questions, concerns revealed worsening of the symptoms.  Follow-up in 3 months.   Kathlee Nations, MD 02/23/2018, 8:32 AM

## 2018-03-11 ENCOUNTER — Ambulatory Visit (INDEPENDENT_AMBULATORY_CARE_PROVIDER_SITE_OTHER): Payer: Medicare Other | Admitting: Pharmacist

## 2018-03-11 DIAGNOSIS — I480 Paroxysmal atrial fibrillation: Secondary | ICD-10-CM | POA: Diagnosis not present

## 2018-03-11 DIAGNOSIS — Z7901 Long term (current) use of anticoagulants: Secondary | ICD-10-CM | POA: Diagnosis not present

## 2018-03-11 LAB — POCT INR: INR: 2.1

## 2018-03-30 ENCOUNTER — Encounter: Payer: Self-pay | Admitting: Family Medicine

## 2018-04-02 ENCOUNTER — Telehealth: Payer: Self-pay | Admitting: Family Medicine

## 2018-04-08 MED ORDER — MONTELUKAST SODIUM 10 MG PO TABS: ORAL_TABLET | ORAL | 0 refills | Status: DC

## 2018-04-08 NOTE — Addendum Note (Signed)
Addended by: Delle Reining on: 04/08/2018 12:02 PM   Modules accepted: Orders

## 2018-04-08 NOTE — Telephone Encounter (Signed)
Patient wants to know why her medication for montelukast (SINGULAIR) 10 MG tablet. Patient is very upset.

## 2018-04-08 NOTE — Telephone Encounter (Signed)
Phone call to patient. Unable to reach. If patient calls back, please let her know a refill of Singulair has been sent to her pharmacy. Please confirm patient appointment on 04/13/18.

## 2018-04-11 NOTE — Progress Notes (Signed)
'@Patient'  ID: Laurie Horton, female    DOB: 16-Jun-1946, 72 y.o.   MRN: [id number]  Chief Complaint  Patient presents with  . Follow-up    increased SOB, increased feelings of being faint, increased oxygen use. Chest tightness and discomfort. Cough with thick yellow phlegm. zyrtec makes her dizzy.     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  She uses oxygen and nebulizers.  She is not interested in rehab or vaccines. New issue: Preoperative pulmonary evaluation.  She is having significant bilateral lower extremity claudication.  She states that she is in severe pain walking from her door to the mailbox to the point she is almost crying.  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  Apparently Dr. Trula Slade wants to try a laparotomy approach.  However Dr. Daleen Squibb might take her to the cardiac Cath Lab and placed stents.  She says she is sensitive to fentanyl and is worried about anesthesia complications but the pain is so severe she is willing to take the risk.  She has had previous cardiac catheterization without any problems other than being sensitive to fentanyl.  She says this can be done in the cardiac Cath Lab with anesthesia support.  She wants me to talk to Dr. Gwenlyn Found and Dr. Trula Slade about this. Plan: Continue oxygen therapy and nebulizers, follow-up in 6 months  Former smoker quit 06/2010.  90 pack years.   Tests:  01/21/2016-CT chest without contrast- moderate centrilobular emphysema and diffuse  bronchial wall thickening mild subpleural density in the dependent lower lobes  01/20/2016-pulmonary function test- airway obstruction and diffusion defect suggesting emphysema Imaging:  02/11/2017-chest x-ray-stable large cardiac silhouette, lungs are hyperinflated, interstitial edema pattern unchanged from prior scans   Cardiac:  07/08/2017-echocardiogram-LV ejection fraction 50 to 23%, grade 1 diastolic dysfunction  Labs:   Micro:   Chart Review:  01/17/2018-successful diamondback orbital rotational arthrectomy, PTA cover stenting of high-grade calcified left common iliac artery stenosis   04/12/18 Acute  72 year old patient presenting today.  With a myriad of respiratory symptoms.  Shortness of breath and sneezing for 2 weeks.  Patient reporting that she is on 2 L of oxygen at home and 2-1/2 L pulsed.  Patient reporting that she is having thick yellow and white mucus.  Denies hemoptysis.  Patient also reporting that she recently was diagnosed with scabies by her dermatologist.  Unfortunately due to patient's allergies patient is unable to take any of the interventions that the dermatologist had suggested her to take.    Allergies  Allergen Reactions  . Alprazolam Anaphylaxis and Other (See Comments)    REACTION: stops breathing  . Bee Venom Anaphylaxis  . Iodine Anaphylaxis, Swelling and Other (See Comments)    REACTION: swelling in throat  . Pseudoephedrine Hcl Er Shortness Of Breath  . Budesonide-Formoterol Fumarate Other (See Comments)    Blisters inside of mouth all over  . Crestor [Rosuvastatin Calcium] Other (See Comments)    Unable to walk  . Esomeprazole Magnesium Other (See Comments)    REACTION: "bouncing off walls"  . Flonase [  Fluticasone Propionate] Other (See Comments)    NOSE BLEED  . Loratadine Other (See Comments)    claritin D causes shaking  . Lotrimin [Clotrimazole] Other (See Comments)    Mouth blisters  . Lunesta [Eszopiclone] Other (See Comments)     REACTION: "slept for a week"  . Oxcarbazepine Other (See Comments)    Causes deep sleep and dizziness  . Statins Other (See Comments)    Can't walk, legs won't work   . Zolpidem Tartrate Other (See Comments)    REACTION: "slept for a week"  . Betadine [Povidone Iodine] Other (See Comments)    Breathing problems  . Clarithromycin Other (See Comments)    All "mycins", Puts into "a" fib, Will take if has to for severe sinus infection  . Effexor [Venlafaxine] Nausea And Vomiting and Other (See Comments)    cramps  . Lexapro [Escitalopram Oxalate] Other (See Comments)    hallucinations  . Aciphex [Rabeprazole Sodium] Rash  . Avelox [Moxifloxacin Hcl In Nacl] Other (See Comments)    Stomach cramps.   Darlin Coco [Valdecoxib] Rash  . Ceclor [Cefaclor] Rash  . Cephalexin Rash and Other (See Comments)    Pt states that she is possibly allergic to this - had a reaction to Cefaclor in the past and she does not want to these class drugs. Added per patient request.  . Covera-Hs [Verapamil Hcl] Palpitations  . Dicyclomine Hcl Rash  . Other Other (See Comments)    Glue from ekg/heart monitor leads --rash, Any MYCINS  . Tessalon Perles Rash   Reviewed allergies extensively with patient.  Patient emphasizes the importance that she not take daily antihistamines, intranasal glucocorticoids.  Immunization History  Administered Date(s) Administered  . Influenza Split 08/03/2011, 08/01/2012  . Influenza,inj,Quad PF,6+ Mos 08/14/2013, 09/03/2014, 09/04/2015, 08/18/2016  . Pneumococcal Conjugate-13 10/01/2014  . Pneumococcal Polysaccharide-23 09/03/1999, 06/02/2006, 08/14/2013  . Td 11/02/1994  . Tdap 08/18/2016    Past Medical History:  Diagnosis Date  . CHF (congestive heart failure) ([place]) 03/02/2017   EF 25-30% 2018  . Complication of anesthesia    various issues with oxygen  saturations post op  . COPD (chronic obstructive pulmonary disease) ([place])   . Depression   . Diverticulitis   . DVT  (deep venous thrombosis) ([place])   . Fibromyalgia   . GERD (gastroesophageal reflux disease)   . Hiatal hernia   . History of deviated nasal septum    left- side  . HTN (hypertension)   . Hyperlipidemia   . Obesity   . On supplemental oxygen therapy    concentrator at night @ 1.5 l/m or when sleeps. O2 Sat niormally 87.  . Osteoporosis   . PAT (paroxysmal atrial tachycardia) ([place])   . PFO (patent foramen ovale)   . PULMONARY NODULE, LEFT LOWER LOBE 10/14/2009   60m LLL nodule dec 2010. Stable and 420min Oct 2012. No further fu  . PVD (peripheral vascular disease) with claudication ([facility]  . Right middle lobe pneumonia ([facility]/2012   First noted at admit 07/10/11. Persists on cxr 07/22/11. Cleared on CT 08/24/11. No further followup  . Stroke ([facility]  . TOBACCO ABUSE 06/04/2009    Tobacco History: Social History   Tobacco Use  Smoking Status Former Smoker  . Packs/day: 3.00  . Years: 30.00  . Pack years: 90.00  . Types: Cigarettes  . Last attempt to quit: 06/02/2010  . Years since quitting: 7.8  Smokeless Tobacco Never Used  Tobacco Comment  QUIT IN 2011   Counseling given: Not Answered Comment: QUIT IN 2011 Patient still not smoking.  Reviewed smoking history with patient.  Patient said she would burn through 3 packs a day.  But not inhale.  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  Patient says that she did this for at least the last 30 years prior to stopping in 2011.  This would give her about a 90-pack-year history.  Outpatient Encounter Medications as of 04/12/2018  Medication Sig  . carvedilol (COREG) 12.5 MG tablet Take 1 tablet (12.5 mg total) by mouth 2 (two) times daily.  . clopidogrel (PLAVIX) 75 MG tablet Take 1 tablet (75 mg total) by mouth daily with breakfast.  . desvenlafaxine (PRISTIQ) 50 MG 24 hr tablet Take 1 tablet (50 mg total) by mouth daily.  . diazepam (VALIUM) 2 MG  tablet Take 1 tablet (2 mg total) by mouth daily as needed for anxiety.  . enoxaparin (LOVENOX) 120 MG/0.8ML injection Inject 0.8 mLs (120 mg total) into the skin daily.  . furosemide (LASIX) 40 MG tablet Take 1 tablet (40 mg total) by mouth daily.  Marland Kitchen gabapentin (NEURONTIN) 100 MG capsule Take 100-200 mg by mouth See admin instructions. 100 mg in am, 200 mg at noon and  200 mg at bedtime  . HYDROcodone-acetaminophen (NORCO/VICODIN) 5-325 MG tablet Take 1 tablet by mouth 3 (three) times daily.   Marland Kitchen ipratropium (ATROVENT) 0.02 % nebulizer solution USE 1 VIAL BY NEBULIZATION 4 (FOUR) TIMES DAILY. (Patient taking differently: USE 1 VIAL BY NEBULIZATION  3 TIMES DAILY AS NEEDED FOR SHORTNESS OF BREATH)  . losartan (COZAAR) 25 MG tablet Take 1 tablet (25 mg total) by mouth daily.  . montelukast (SINGULAIR) 10 MG tablet TAKE 1 TABLET BY MOUTH EVERYDAY AT BEDTIME  . ondansetron (ZOFRAN ODT) 8 MG disintegrating tablet Take 1 tablet (8 mg total) by mouth every 8 (eight) hours as needed for nausea or vomiting.  Marland Kitchen oxybutynin (DITROPAN-XL) 10 MG 24 hr tablet TAKE 1 TABLET BY MOUTH EVERY DAY (Patient taking differently: TAKE 10 MG BY MOUTH EVERY DAY)  . OXYGEN Inhale 1.5-2 L into the lungs as needed (for shortness of breath).  . pantoprazole (PROTONIX) 40 MG tablet Take 40 mg by mouth daily.   . permethrin (ELIMITE) 5 % cream Apply 1 application topically once. Apply to entire body at bedtime and wash off in AM.  . polyethylene glycol (MIRALAX / GLYCOLAX) packet Take 17 g by mouth every other day.   . rosuvastatin (CRESTOR) 5 MG tablet Take 1 tablet (5 mg total) by mouth 2 (two) times a week. Sunday and Wednesday  . spironolactone (ALDACTONE) 25 MG tablet Take 1 tablet (25 mg total) by mouth daily.  Marland Kitchen tiZANidine (ZANAFLEX) 2 MG tablet Take 2 mg by mouth at bedtime.   . traZODone (DESYREL) 100 MG tablet Take 1 tablet (100 mg total) by mouth at bedtime.  . triamcinolone ointment (KENALOG) 0.1 % Apply 1 application  topically at bedtime as needed (for right ear irritation).   . warfarin (COUMADIN) 5 MG tablet TAKE 1/2 TO 1 TABLET BY MOUTH EVERY DAY OR AS DIRECTED BY COUMADIN CLINIC (Patient taking differently: Take 2.5-5 mg by mouth See admin instructions. Take 5 mg by mouth daily on Monday and Friday. Take 2.5 mg by mouth daily on all other days.)   No facility-administered encounter medications on file as of 04/12/2018.  Review of Systems  Constitutional:  +fatigue No  weight loss, night sweats,  fevers, chills, fatigue, or  lassitude HEENT:   No headaches,  Difficulty swallowing,  Tooth/dental problems, or  Sore throat, No sneezing, itching, ear ache, nasal congestion, post nasal drip  CV: +heart racing with zyrtec  No chest pain,  orthopnea, PND, swelling in lower extremities, anasarca, dizziness, palpitations, syncope  GI: No heartburn, indigestion, abdominal pain, nausea, vomiting, diarrhea, change in bowel habits, loss of appetite, bloody stools Resp: +sob with rest and exertion, productive cough, white mucous, needing more O2 at home (2L at home, 2.5 pulsed)    No chest wall deformity Skin: no rash, lesions, no skin changes. GU: no dysuria, change in color of urine, no urgency or frequency.  No flank pain, no hematuria  MS:  +back pain    No joint pain or swelling.  No decreased range of motion Psych:  No change in mood or affect. No depression or anxiety.  No memory loss.   Physical Exam  Pulse (!) 58   Ht '5\' 3"'  (1.6 m)   Wt 198 lb 9.6 oz (90.1 kg)   SpO2 98%   BMI 35.18 kg/m   Wt Readings from Last 3 Encounters:  04/12/18 198 lb 9.6 oz (90.1 kg)  02/02/18 190 lb (86.2 kg)  01/18/18 180 lb 12.4 oz (82 kg)   >>>Of note patient refused blood pressure at office visit today.  Patient saying she cannot take any arm cuff blood pressure due to fibromyalgia.  Unfortunately the wrist blood pressure cuff the patient traditionally carries with her is without batteries.   GEN: A/Ox3;  pleasant , NAD, well nourished, on 2.5L pulsed    HEENT:  [place]/AT,  EACs-clear, TMs - effusions bilaterally without infections, NOSE-inflamed, multiple healing dark blood scabs in both nares, THROAT- +postnasal drip   NECK:  Supple w/ fair ROM; no JVD; normal carotid impulses w/o bruits;  no lymphadenopathy (pain with fibromyalgia).    RESP:  Clear  P & A; w/o, wheezes/ rales/ or rhonchi. no accessory muscle use, no dullness to percussion  CARD:  RRR, no m/r/g, no peripheral edema, pulses intact, no cyanosis or clubbing.  GI:   Soft & nt; nml bowel sounds; no organomegaly or masses detected.   Musco: Warm bil, no deformities or joint swelling noted, +fibromyalgia pain on exam with slight palpation   Neuro: alert, no focal deficits noted.    Skin: Warm, rash on lower legs - derm diagnosed with scabies    Lab Results:  CBC    Component Value Date/Time   WBC 8.5 01/18/2018 0614   RBC 4.01 01/18/2018 0614   HGB 11.9 (L) 01/18/2018 0614   HGB 12.4 01/11/2018 0000   HCT 36.6 01/18/2018 0614   HCT 37.1 01/11/2018 0000   PLT 213 01/18/2018 0614   PLT 205 01/11/2018 0000   MCV 91.3 01/18/2018 0614   MCV 88 01/11/2018 0000   MCH 29.7 01/18/2018 0614   MCHC 32.5 01/18/2018 0614   RDW 14.9 01/18/2018 0614   RDW 15.2 01/11/2018 0000   LYMPHSABS 1.1 10/20/2017 0846   MONOABS 0.6 02/11/2017 0146   EOSABS 0.1 10/20/2017 0846   BASOSABS 0.0 10/20/2017 0846    BMET    Component Value Date/Time   NA 138 01/18/2018 0614   NA 140 01/11/2018 0000   K 3.9 01/18/2018 0614   CL 100 (L) 01/18/2018 0614   CO2 26 01/18/2018 0614   GLUCOSE 115 (H) 01/18/2018 8032  BUN 14 01/18/2018 0614   BUN 14 01/11/2018 0000   CREATININE 0.97 01/18/2018 0614   CREATININE 0.91 08/18/2016 0843   CALCIUM 10.0 01/18/2018 0614   GFRNONAA 57 (L) 01/18/2018 0614   GFRNONAA 63 07/11/2015 0828   GFRAA >60 01/18/2018 0614   GFRAA 73 07/11/2015 0828    BNP    Component Value Date/Time   BNP 523.2 (H)  02/12/2017 0331    ProBNP    Component Value Date/Time   PROBNP 129.0 (H) 09/19/2014 1056    Imaging: No results found.   Assessment & Plan:   72 year old patient seen today.  Mild AR flare with probable upper respiratory infection.  Unfortunately I believe this is viral.  I do not see need for antibiotics at this time.  Due to patient's extensive allergy list were very limited in treating the symptoms over this viral course.  Patient wants to proceed with just nasal saline rinses.  I offered other daily antihistamines, intranasal sprays -but patient did not want to "risk it".  I will bring patient back to see Dr. [name] in 3 months.    Could consider potential referral to shared decision-making with Sarah after next appointment.  Patient was quite anxious at this appointment so I did not discuss it.  Based off of her smoking history.  Chronic respiratory failure with hypoxia (H[facility]) Continue oxygen therapy as prescribed  COPD, severe ([place]) Continue nebulizers as prescribed Follow-up with Dr. [name] in 3 months  Upper respiratory infection Upper respiratory infection Exacerbated by mild AR flare  Continue Singulair Continue breathing treatments Continue nasal saline rinses    This appointment was 28 minutes long with over 50% of the time in direct face-to-face, assessment, patient discussion.  Extensive discussion and plan of care, allergies, medications, patient past medical history during this appointment.  Lauraine Rinne, NP 04/12/2018

## 2018-04-12 ENCOUNTER — Ambulatory Visit (INDEPENDENT_AMBULATORY_CARE_PROVIDER_SITE_OTHER): Payer: Medicare Other | Admitting: Pulmonary Disease

## 2018-04-12 ENCOUNTER — Encounter: Payer: Self-pay | Admitting: Pulmonary Disease

## 2018-04-12 DIAGNOSIS — J9611 Chronic respiratory failure with hypoxia: Secondary | ICD-10-CM | POA: Diagnosis not present

## 2018-04-12 DIAGNOSIS — J449 Chronic obstructive pulmonary disease, unspecified: Secondary | ICD-10-CM | POA: Diagnosis not present

## 2018-04-12 DIAGNOSIS — J069 Acute upper respiratory infection, unspecified: Secondary | ICD-10-CM | POA: Diagnosis not present

## 2018-04-12 NOTE — Assessment & Plan Note (Signed)
Continue oxygen therapy as prescribed 

## 2018-04-12 NOTE — Patient Instructions (Addendum)
Continue to hydrate well and eat well.  Unfortunately, I believe that you are having a slight allergy flare as well as a upper respiratory infection.  Were limited based off of your allergies and past medical history on interventions that we can do.  I would continue nasal saline rinses.  Follow up with Derm office regarding they following items that we discussed today: >>>zyrtec - reaction >>>hydroxyzine -concerns with sleeping   I will have you follow-up with Dr. [name] in 3 months      Please contact the office if your symptoms worsen or you have concerns that you are not improving.   Thank you for choosing [facility] for your healthcare, and for allowing [place] to partner with you on your healthcare journey. I am thankful to be able to provide care to you today.   Wyn Quaker FNP-C

## 2018-04-12 NOTE — Assessment & Plan Note (Signed)
Upper respiratory infection Exacerbated by mild AR flare  Continue Singulair Continue breathing treatments Continue nasal saline rinses

## 2018-04-12 NOTE — Assessment & Plan Note (Signed)
Continue nebulizers as prescribed Follow-up with Dr. [name] in 3 months

## 2018-04-13 ENCOUNTER — Encounter: Payer: Self-pay | Admitting: Family Medicine

## 2018-04-13 ENCOUNTER — Other Ambulatory Visit: Payer: Self-pay

## 2018-04-13 ENCOUNTER — Ambulatory Visit (INDEPENDENT_AMBULATORY_CARE_PROVIDER_SITE_OTHER): Payer: Medicare Other | Admitting: Family Medicine

## 2018-04-13 ENCOUNTER — Telehealth: Payer: Self-pay | Admitting: Internal Medicine

## 2018-04-13 VITALS — BP 128/86 | HR 79 | Temp 98.0°F | Resp 16 | Ht 63.78 in | Wt 186.0 lb

## 2018-04-13 DIAGNOSIS — Z0000 Encounter for general adult medical examination without abnormal findings: Secondary | ICD-10-CM | POA: Diagnosis not present

## 2018-04-13 DIAGNOSIS — Z1211 Encounter for screening for malignant neoplasm of colon: Secondary | ICD-10-CM | POA: Diagnosis not present

## 2018-04-13 DIAGNOSIS — R739 Hyperglycemia, unspecified: Secondary | ICD-10-CM | POA: Diagnosis not present

## 2018-04-13 DIAGNOSIS — E7800 Pure hypercholesterolemia, unspecified: Secondary | ICD-10-CM | POA: Diagnosis not present

## 2018-04-13 DIAGNOSIS — I10 Essential (primary) hypertension: Secondary | ICD-10-CM

## 2018-04-13 DIAGNOSIS — I5022 Chronic systolic (congestive) heart failure: Secondary | ICD-10-CM

## 2018-04-13 DIAGNOSIS — F411 Generalized anxiety disorder: Secondary | ICD-10-CM

## 2018-04-13 DIAGNOSIS — J449 Chronic obstructive pulmonary disease, unspecified: Secondary | ICD-10-CM

## 2018-04-13 DIAGNOSIS — M810 Age-related osteoporosis without current pathological fracture: Secondary | ICD-10-CM

## 2018-04-13 DIAGNOSIS — M4716 Other spondylosis with myelopathy, lumbar region: Secondary | ICD-10-CM

## 2018-04-13 DIAGNOSIS — J9611 Chronic respiratory failure with hypoxia: Secondary | ICD-10-CM

## 2018-04-13 DIAGNOSIS — T50905A Adverse effect of unspecified drugs, medicaments and biological substances, initial encounter: Secondary | ICD-10-CM

## 2018-04-13 DIAGNOSIS — I4891 Unspecified atrial fibrillation: Secondary | ICD-10-CM

## 2018-04-13 LAB — CBC WITH DIFFERENTIAL/PLATELET
BASOS: 0 %
Basophils Absolute: 0 10*3/uL (ref 0.0–0.2)
EOS (ABSOLUTE): 0.1 10*3/uL (ref 0.0–0.4)
EOS: 2 %
HEMATOCRIT: 42.3 % (ref 34.0–46.6)
HEMOGLOBIN: 13.6 g/dL (ref 11.1–15.9)
IMMATURE GRANS (ABS): 0 10*3/uL (ref 0.0–0.1)
IMMATURE GRANULOCYTES: 0 %
LYMPHS: 18 %
Lymphocytes Absolute: 1.4 10*3/uL (ref 0.7–3.1)
MCH: 29.5 pg (ref 26.6–33.0)
MCHC: 32.2 g/dL (ref 31.5–35.7)
MCV: 92 fL (ref 79–97)
MONOCYTES: 11 %
Monocytes Absolute: 0.8 10*3/uL (ref 0.1–0.9)
NEUTROS PCT: 69 %
Neutrophils Absolute: 5.4 10*3/uL (ref 1.4–7.0)
PLATELETS: 262 10*3/uL (ref 150–450)
RBC: 4.61 x10E6/uL (ref 3.77–5.28)
RDW: 14.5 % (ref 12.3–15.4)
WBC: 7.7 10*3/uL (ref 3.4–10.8)

## 2018-04-13 LAB — POCT URINALYSIS DIP (MANUAL ENTRY)
BILIRUBIN UA: NEGATIVE mg/dL
Bilirubin, UA: NEGATIVE
Glucose, UA: NEGATIVE mg/dL
Nitrite, UA: NEGATIVE
PH UA: 5.5 (ref 5.0–8.0)
PROTEIN UA: NEGATIVE mg/dL
SPEC GRAV UA: 1.02 (ref 1.010–1.025)
Urobilinogen, UA: 0.2 E.U./dL

## 2018-04-13 LAB — COMPREHENSIVE METABOLIC PANEL
A/G RATIO: 2.3 — AB (ref 1.2–2.2)
ALBUMIN: 5 g/dL — AB (ref 3.5–4.8)
ALT: 10 IU/L (ref 0–32)
AST: 13 IU/L (ref 0–40)
Alkaline Phosphatase: 111 IU/L (ref 39–117)
BUN/Creatinine Ratio: 15 (ref 12–28)
BUN: 17 mg/dL (ref 8–27)
Bilirubin Total: 0.4 mg/dL (ref 0.0–1.2)
CALCIUM: 10.3 mg/dL (ref 8.7–10.3)
CO2: 25 mmol/L (ref 20–29)
Chloride: 94 mmol/L — ABNORMAL LOW (ref 96–106)
Creatinine, Ser: 1.17 mg/dL — ABNORMAL HIGH (ref 0.57–1.00)
GFR calc non Af Amer: 47 mL/min/{1.73_m2} — ABNORMAL LOW (ref >59)
GFR, EST AFRICAN AMERICAN: 54 mL/min/{1.73_m2} — AB (ref >59)
Globulin, Total: 2.2 g/dL (ref 1.5–4.5)
Glucose: 114 mg/dL — ABNORMAL HIGH (ref 65–99)
POTASSIUM: 4.6 mmol/L (ref 3.5–5.2)
Sodium: 138 mmol/L (ref 134–144)
Total Protein: 7.2 g/dL (ref 6.0–8.5)

## 2018-04-13 LAB — LIPID PANEL
Chol/HDL Ratio: 4.5 ratio — ABNORMAL HIGH (ref 0.0–4.4)
Cholesterol, Total: 183 mg/dL (ref 100–199)
HDL: 41 mg/dL (ref >39)
LDL Calculated: 110 mg/dL — ABNORMAL HIGH (ref 0–99)
Triglycerides: 158 mg/dL — ABNORMAL HIGH (ref 0–149)
VLDL CHOLESTEROL CAL: 32 mg/dL (ref 5–40)

## 2018-04-13 LAB — HEMOGLOBIN A1C
Est. average glucose Bld gHb Est-mCnc: 134 mg/dL
Hgb A1c MFr Bld: 6.3 % — ABNORMAL HIGH (ref 4.8–5.6)

## 2018-04-13 MED ORDER — MONTELUKAST SODIUM 10 MG PO TABS: ORAL_TABLET | ORAL | 3 refills | Status: DC

## 2018-04-13 MED ORDER — OXYBUTYNIN CHLORIDE ER 10 MG PO TB24: ORAL_TABLET | ORAL | 3 refills | Status: DC

## 2018-04-13 MED ORDER — ALENDRONATE SODIUM 70 MG PO TABS: 70.0000 mg | ORAL_TABLET | ORAL | 3 refills | Status: DC

## 2018-04-13 MED ORDER — ONDANSETRON 8 MG PO TBDP: 8.0000 mg | ORAL_TABLET | Freq: Three times a day (TID) | ORAL | 0 refills | Status: DC | PRN

## 2018-04-13 NOTE — Progress Notes (Signed)
Subjective:    Patient ID: Laurie Horton, female    DOB: 08-19-1946, 72 y.o.   MRN: [id number]  04/13/2018  Medicare Wellness    HPI This 72 y.o. female presents for ROUTINE PHYSICAL EXAMINATION, ANNUAL WELLNESS VISIT, AND FOLLOW-UP OF CHRONIC MEDICAL CONDITIONS.   Visual Acuity Screening   Right eye Left eye Both eyes  Without correction:     With correction: 20/25 20/25 20/25     BP Readings from Last 3 Encounters:  05/03/18 101/62  04/13/18 128/86  02/02/18 134/80   Wt Readings from Last 3 Encounters:  05/03/18 192 lb (87.1 kg)  04/13/18 186 lb (84.4 kg)  04/12/18 198 lb 9.6 oz (90.1 kg)   Immunization History  Administered Date(s) Administered  . Influenza Split 08/03/2011, 08/01/2012  . Influenza,inj,Quad PF,6+ Mos 08/14/2013, 09/03/2014, 09/04/2015, 08/18/2016  . Pneumococcal Conjugate-13 10/01/2014  . Pneumococcal Polysaccharide-23 09/03/1999, 06/02/2006, 08/14/2013  . Td 11/02/1994  . Tdap 08/18/2016   Health Maintenance  Topic Date Due  . INFLUENZA VACCINE  06/22/2018 (Originally 06/02/2018)  . MAMMOGRAM  09/29/2018  . Fecal DNA (Cologuard)  05/20/2021  . TETANUS/TDAP  08/18/2026  . DEXA SCAN  Completed  . Hepatitis C Screening  Completed  . PNA vac Low Risk Adult  Completed    Mite bites/Scabies: saw Dr. Dorita Sciara PA; multiple bites on forearms.  Had mites.  Distal legs B.  Unable to sleep.  PetX evaluated; no fleas.  Prescribed medication.   Prescribed hydroxyzine yet unable to take with other sedating medications.    Also prescribed Claritin yet allergic.  Prescribed topical cream; prescribed Elimite.  Repeat in one week.  COPD and chronic respiratory failure with hypoxia: saw pulmonology yesterday.  No change in medication.    HTN: Patient reports good compliance with medication, good tolerance to medication, and good symptom control.    Hypercholesterolemia: Patient reports good compliance with medication, good tolerance to medication, and good  symptom control.    Hyperglycemia: presenting for labs.  Generalized anxiety disorder: followed by psychiatry closely.   Claudication: followed by Dr. Kayren Eaves. S/p PAD surgery by D.r Allyson Sabal; able to walk further now; improved quality of life.  Awake for the entire procedure; not happy.     Chronic lower back pain: followed by pain clinic.  Scoliosis.  Pain management; s/p injections. Bodea..  Hydrocodone and gabapentin.  [place].  Renal cyst: not a surgical candidate.  Followed by Dr. Olivia Canter every year for renal cyst.      Review of Systems  Constitutional: Negative for activity change, appetite change, chills, diaphoresis, fatigue, fever and unexpected weight change.  HENT: Negative for congestion, dental problem, drooling, ear discharge, ear pain, facial swelling, hearing loss, mouth sores, nosebleeds, postnasal drip, rhinorrhea, sinus pressure, sneezing, sore throat, tinnitus, trouble swallowing and voice change.   Eyes: Negative for photophobia, pain, discharge, redness, itching and visual disturbance.  Respiratory: Negative for apnea, cough, choking, chest tightness, shortness of breath, wheezing and stridor.   Cardiovascular: Negative for chest pain, palpitations and leg swelling.  Gastrointestinal: Positive for nausea. Negative for abdominal distention, abdominal pain, anal bleeding, blood in stool, constipation, diarrhea, rectal pain and vomiting.  Endocrine: Negative for cold intolerance, heat intolerance, polydipsia, polyphagia and polyuria.  Genitourinary: Negative for decreased urine volume, difficulty urinating, dyspareunia, dysuria, enuresis, flank pain, frequency, genital sores, hematuria, menstrual problem, pelvic pain, urgency, vaginal bleeding, vaginal discharge and vaginal pain.       Nocturia x 2.  Urinary leakage YES; wears depends with outings.  Musculoskeletal: Negative for arthralgias, back pain, gait problem, joint swelling, myalgias, neck pain and neck stiffness.   Skin: Positive for rash. Negative for color change, pallor and wound.  Allergic/Immunologic: Negative for environmental allergies, food allergies and immunocompromised state.  Neurological: Positive for dizziness and headaches. Negative for tremors, seizures, syncope, facial asymmetry, speech difficulty, weakness, light-headedness and numbness.  Hematological: Negative for adenopathy. Does not bruise/bleed easily.  Psychiatric/Behavioral: Negative for agitation, behavioral problems, confusion, decreased concentration, dysphoric mood, hallucinations, self-injury, sleep disturbance and suicidal ideas. The patient is nervous/anxious. The patient is not hyperactive.        Bedtime 900; wakes up 430am.    Past Medical History:  Diagnosis Date  . CHF (congestive heart failure) (HCC) 03/02/2017   EF 25-30% 2018  . Complication of anesthesia    various issues with oxygen  saturations post op  . COPD (chronic obstructive pulmonary disease) (HCC)   . Depression   . Diverticulitis   . DVT (deep venous thrombosis) (HCC)   . Fibromyalgia   . GERD (gastroesophageal reflux disease)   . Hiatal hernia   . History of deviated nasal septum    left- side  . HTN (hypertension)   . Hyperlipidemia   . Obesity   . On supplemental oxygen therapy    concentrator at night @ 1.5 l/m or when sleeps. O2 Sat niormally 87.  . Osteoporosis   . PAT (paroxysmal atrial tachycardia) (HCC)   . PFO (patent foramen ovale)   . PULMONARY NODULE, LEFT LOWER LOBE 10/14/2009   5mm LLL nodule dec 2010. Stable and 4mm in Oct 2012. No further fu  . PVD (peripheral vascular disease) with claudication (HCC) 12/2017  . Right middle lobe pneumonia (HCC) 07/24/2011   First noted at admit 07/10/11. Persists on cxr 07/22/11. Cleared on CT 08/24/11. No further followup  . Stroke (HCC)   . TOBACCO ABUSE 06/04/2009   Past Surgical History:  Procedure Laterality Date  . CARDIOVASCULAR STRESS TEST  12/26/2004   EF 74%. NO EVIDENCE OF  ISCHEMIA  . ESOPHAGOGASTRODUODENOSCOPY (EGD) WITH PROPOFOL N/A 04/15/2015   Procedure: ESOPHAGOGASTRODUODENOSCOPY (EGD) WITH PROPOFOL;  Surgeon: Carman Ching, MD;  Location: [facility];  Service: Endoscopy;  Laterality: N/A;  . KNEE ARTHROSCOPY  2000   left  . LAPAROSCOPIC CHOLECYSTECTOMY  04-16-2010   [place]  . LOWER EXTREMITY ANGIOGRAPHY N/A 09/09/2017   Procedure: Lower Extremity Angiography;  Surgeon: Runell Gess, MD;  Location[facility] INVASIVE CV LAB;  Service: Cardiovascular;  Laterality: N/A;  . LOWER EXTREMITY INTERVENTION Left 01/17/2018   Procedure: LOWER EXTREMITY INTERVENTION;  Surgeon: Runell Gess, MD;  Location: [facility] INVASIVE CV LAB;  Service: Cardiovascular;  Laterality: Left;  . MOUTH SURGERY     03-26-15 multiple extractions stitches remains  . PERIPHERAL VASCULAR INTERVENTION Left 01/17/2018   Procedure: PERIPHERAL VASCULAR INTERVENTION;  Surgeon: Runell Gess, MD;  Location[facility] INVASIVE CV LAB;  Service: Cardiovascular;  Laterality: Left;  COMMON ILIAC  . RIGHT/LEFT HEART CATH AND CORONARY ANGIOGRAPHY N/A 03/04/2017   Procedure: Right/Left Heart Cath and Coronary Angiography;  Surgeon: Peter M Swaziland, MD;  Location: [facility] INVASIVE CV LAB;  Service: Cardiovascular;  Laterality: N/A;  . TOTAL ABDOMINAL HYSTERECTOMY     post op needed oxygen was told "she gave them a scare"  . TUBAL LIGATION    . US ECHOCARDIOGRAPHY  11/20/2009   EF 55-60%   Allergies  Allergen Reactions  . Alprazolam Anaphylaxis and Other (See Comments)    REACTION: stops breathing  .  Bee Venom Anaphylaxis  . Iodine Anaphylaxis, Swelling and Other (See Comments)    REACTION: swelling in throat  . Pseudoephedrine Hcl Er Shortness Of Breath  . Budesonide-Formoterol Fumarate Other (See Comments)    Blisters inside of mouth all over  . Crestor [Rosuvastatin Calcium] Other (See Comments)    Unable to walk  . Esomeprazole Magnesium Other (See Comments)    REACTION: "bouncing off walls"  . Flonase  [Fluticasone Propionate] Other (See Comments)    NOSE BLEED  . Loratadine Other (See Comments)    claritin D causes shaking  . Lotrimin [Clotrimazole] Other (See Comments)    Mouth blisters  . Lunesta [Eszopiclone] Other (See Comments)    REACTION: "slept for a week"  . Oxcarbazepine Other (See Comments)    Causes deep sleep and dizziness  . Statins Other (See Comments)    Can't walk, legs won't work   . Zolpidem Tartrate Other (See Comments)    REACTION: "slept for a week"  . Betadine [Povidone Iodine] Other (See Comments)    Breathing problems  . Clarithromycin Other (See Comments)    All "mycins", Puts into "a" fib, Will take if has to for severe sinus infection  . Effexor [Venlafaxine] Nausea And Vomiting and Other (See Comments)    cramps  . Lexapro [Escitalopram Oxalate] Other (See Comments)    hallucinations  . Aciphex [Rabeprazole Sodium] Rash  . Alendronate Sodium Other (See Comments)    "caused stomach problems for 3 days"  . Avelox [Moxifloxacin Hcl In Nacl] Other (See Comments)    Stomach cramps.   Marlowe Aschoff [Valdecoxib] Rash  . Ceclor [Cefaclor] Rash  . Cephalexin Rash and Other (See Comments)    Pt states that she is possibly allergic to this - had a reaction to Cefaclor in the past and she does not want to these class drugs. Added per patient request.  . Covera-Hs [Verapamil Hcl] Palpitations  . Dicyclomine Hcl Rash  . Other Other (See Comments)    Glue from ekg/heart monitor leads --rash, Any MYCINS  . Tessalon Perles Rash   Current Outpatient Medications on File Prior to Visit  Medication Sig Dispense Refill  . carvedilol (COREG) 12.5 MG tablet Take 1 tablet (12.5 mg total) by mouth 2 (two) times daily. 60 tablet 11  . clopidogrel (PLAVIX) 75 MG tablet Take 1 tablet (75 mg total) by mouth daily with breakfast. 30 tablet 1  . desvenlafaxine (PRISTIQ) 50 MG 24 hr tablet Take 1 tablet (50 mg total) by mouth daily. 30 tablet 2  . furosemide (LASIX) 40 MG tablet  Take 1 tablet (40 mg total) by mouth daily. 30 tablet 11  . gabapentin (NEURONTIN) 100 MG capsule Take 100-200 mg by mouth See admin instructions. 100 mg in am, 200 mg at noon and  200 mg at bedtime    . HYDROcodone-acetaminophen (NORCO/VICODIN) 5-325 MG tablet Take 1 tablet by mouth 3 (three) times daily.     Marland Kitchen ipratropium (ATROVENT) 0.02 % nebulizer solution USE 1 VIAL BY NEBULIZATION 4 (FOUR) TIMES DAILY. (Patient taking differently: USE 1 VIAL BY NEBULIZATION  3 TIMES DAILY AS NEEDED FOR SHORTNESS OF BREATH) 75 mL 3  . OXYGEN Inhale 1.5-2 L into the lungs as needed (for shortness of breath).    . pantoprazole (PROTONIX) 40 MG tablet Take 40 mg by mouth daily.     . polyethylene glycol (MIRALAX / GLYCOLAX) packet Take 17 g by mouth every other day.     . rosuvastatin (CRESTOR) 5  MG tablet Take 1 tablet (5 mg total) by mouth 2 (two) times a week. Sunday and Wednesday 24 tablet 3  . spironolactone (ALDACTONE) 25 MG tablet Take 1 tablet (25 mg total) by mouth daily. 30 tablet 11  . tiZANidine (ZANAFLEX) 2 MG tablet Take 2 mg by mouth at bedtime.     . traZODone (DESYREL) 100 MG tablet Take 1 tablet (100 mg total) by mouth at bedtime. 30 tablet 2  . triamcinolone ointment (KENALOG) 0.1 % Apply 1 application topically at bedtime as needed (for right ear irritation).     . warfarin (COUMADIN) 5 MG tablet TAKE 1/2 TO 1 TABLET BY MOUTH EVERY DAY OR AS DIRECTED BY COUMADIN CLINIC (Patient taking differently: Take 2.5-5 mg by mouth See admin instructions. Take 5 mg by mouth daily on Monday and Friday. Take 2.5 mg by mouth daily on all other days.) 90 tablet 0   No current facility-administered medications on file prior to visit.    Social History   Socioeconomic History  . Marital status: Divorced    Spouse name: Not on file  . Number of children: 2  . Years of education: Not on file  . Highest education level: Not on file  Occupational History  . Occupation: disability  Social Needs  . Financial  resource strain: Not on file  . Food insecurity:    Worry: Not on file    Inability: Not on file  . Transportation needs:    Medical: Not on file    Non-medical: Not on file  Tobacco Use  . Smoking status: Former Smoker    Packs/day: 3.00    Years: 30.00    Pack years: 90.00    Types: Cigarettes    Last attempt to quit: 06/02/2010    Years since quitting: 8.0  . Smokeless tobacco: Never Used  . Tobacco comment: QUIT IN 2011  Substance and Sexual Activity  . Alcohol use: No    Alcohol/week: 0.0 oz  . Drug use: No  . Sexual activity: Yes    Partners: Male    Birth control/protection: Post-menopausal, Surgical  Lifestyle  . Physical activity:    Days per week: Not on file    Minutes per session: Not on file  . Stress: Not on file  Relationships  . Social connections:    Talks on phone: Not on file    Gets together: Not on file    Attends religious service: Not on file    Active member of club or organization: Not on file    Attends meetings of clubs or organizations: Not on file    Relationship status: Not on file  . Intimate partner violence:    Fear of current or ex partner: Not on file    Emotionally abused: Not on file    Physically abused: Not on file    Forced sexual activity: Not on file  Other Topics Concern  . Not on file  Social History Narrative   Marital status: divorced in 1978 after ten years; dating casually in 2019.      Children: 3 biological children; 3 court appointed children; 6 grandchildren; 5 gg      Lives: alone; children in [place] and [place].      Employment: retired age 43; disability for COPD, CVA at age 34.      Tobacco: former smoker; quit smoking 2011.      Alcohol: none      Exercise: walks dog four times per day; goes  to pool three times per week.      ADLs: drives; no assistant devices; does have a walker.  Cleaning is limited in 2018.  Daughter helps with cleaning.  Does own grocery shopping.      Advanced Directives: YES;  DNR/DNI; HCPOA: [name] Martin/daughter youngest.  Blind.               Family History  Problem Relation Age of Onset  . Dementia Mother   . Diabetes Mother   . Alzheimer's disease Mother   . Heart attack Brother 37  . Heart attack Father   . Schizophrenia Sister   . Diabetes Sister   . Tremor Sister        Objective:    BP 128/86   Pulse 79   Temp 98 F (36.7 C)   Resp 16   Ht 5' 3.78" (1.62 m)   Wt 186 lb (84.4 kg)   SpO2 94%   BMI 32.15 kg/m  Physical Exam  Constitutional: She is oriented to person, place, and time. She appears well-developed and well-nourished. No distress.  HENT:  Head: Normocephalic and atraumatic.  Right Ear: External ear normal.  Left Ear: External ear normal.  Nose: Nose normal.  Mouth/Throat: Oropharynx is clear and moist.  Eyes: Pupils are equal, round, and reactive to light. Conjunctivae and EOM are normal.  Neck: Normal range of motion and full passive range of motion without pain. Neck supple. No JVD present. Carotid bruit is not present. No thyromegaly present.  Cardiovascular: Normal rate, regular rhythm and normal heart sounds. Exam reveals no gallop and no friction rub.  No murmur heard. Pulmonary/Chest: Effort normal and breath sounds normal. She has no wheezes. She has no rales.  Abdominal: Soft. Bowel sounds are normal. She exhibits no distension and no mass. There is no tenderness. There is no rebound and no guarding.  Musculoskeletal:       Right shoulder: Normal.       Left shoulder: Normal.       Cervical back: Normal.  Lymphadenopathy:    She has no cervical adenopathy.  Neurological: She is alert and oriented to person, place, and time. She has normal reflexes. No cranial nerve deficit. She exhibits normal muscle tone. Coordination normal.  Skin: Skin is warm and dry. No rash noted. She is not diaphoretic. No erythema. No pallor.  Psychiatric: She has a normal mood and affect. Her behavior is normal. Judgment and  thought content normal.  Nursing note and vitals reviewed.  No results found. Depression screen [facility] 2/9 04/13/2018 04/13/2018 10/20/2017 04/28/2017 02/08/2017  Decreased Interest 1 0 0 0 0  Down, Depressed, Hopeless 1 0 0 0 0  PHQ - 2 Score 2 0 0 0 0  Altered sleeping 1 - - - -  Tired, decreased energy 1 - - - -  Change in appetite 0 - - - -  Feeling bad or failure about yourself  1 - - - -  Trouble concentrating 0 - - - -  Moving slowly or fidgety/restless 0 - - - -  Suicidal thoughts 0 - - - -  PHQ-9 Score 5 - - - -  Difficult doing work/chores - - - - -  Some recent data might be hidden   Fall Risk  04/13/2018 04/13/2018 10/20/2017 04/28/2017 02/08/2017  Falls in the past year? No No No No No  Number falls in past yr: - - - - -  Comment - - - - -  Injury with Fall? - - - - -  Comment - - - - -  Follow up - - - - -    Functional Status Survey: Is the patient deaf or have difficulty hearing?: Yes(left side ) Does the patient have difficulty seeing, even when wearing glasses/contacts?: No Does the patient have difficulty concentrating, remembering, or making decisions?: No Does the patient have difficulty walking or climbing stairs?: Yes Does the patient have difficulty dressing or bathing?: No Does the patient have difficulty doing errands alone such as visiting a doctor's office or shopping?: No     Assessment & Plan:   1. Medicare annual wellness visit, subsequent   2. Routine physical examination   3. Essential hypertension   4. Pure hypercholesterolemia   5. Hyperglycemia, drug-induced   6. Colon cancer screening   7. Age-related osteoporosis without current pathological fracture   8. Atrial fibrillation with RVR (HCC)   9. Chronic systolic CHF (congestive heart failure) (HCC)   10. PAF (paroxysmal atrial fibrillation) (HCC)   11. Chronic respiratory failure with hypoxia (HCC)   12. COPD, severe (HCC)   13. 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 Generalized anxiety disorder     -anticipatory guidance provided --- exercise, weight loss, safe driving practices, aspirin 81mg  daily. -obtain age appropriate screening labs and labs for chronic disease management. -moderate fall risk; receiving treatment for depression with psychiatry; no evidence of hearing loss.  Discussed advanced directives and living will; also discussed end of life issues including code status.  -Osteoporosis: New; recommend Fosamax weekly; current guidelines recommend the following treatment: 1.  Calcium 600mg  twice daily OR three servings of dairy daily.  2.  Vitamin D [id number] IU daily.  3.  Daily weight bearing exercise for 30 minutes.  4.  Repeat bone density scan in 2 years. -atrial fibrillation/CHF:  Followed by cardiology. -COPD with chronic respiratory failure with hypoxia: followed by pulmonology. -anxiety with depression: followed by psychiatry.   Orders Placed This Encounter  Procedures  . CBC with Differential/Platelet  . Comprehensive metabolic panel    Order Specific Question:   Has the patient fasted?    Answer:   No  . Lipid panel    Order Specific Question:   Has the patient fasted?    Answer:   No  . Hemoglobin A1c  . Cologuard  . POCT urinalysis dipstick   Meds ordered this encounter  Medications  . oxybutynin (DITROPAN-XL) 10 MG 24 hr tablet    Sig: TAKE 10 MG BY MOUTH EVERY DAY    Dispense:  90 tablet    Refill:  3  . montelukast (SINGULAIR) 10 MG tablet    Sig: TAKE 1 TABLET BY MOUTH EVERYDAY AT BEDTIME    Dispense:  90 tablet    Refill:  3  . ondansetron (ZOFRAN ODT) 8 MG disintegrating tablet    Sig: Take 1 tablet (8 mg total) by mouth every 8 (eight) hours as needed for nausea or vomiting.    Dispense:  20 tablet    Refill:  0  . DISCONTD: alendronate (FOSAMAX) 70 MG tablet    Sig: Take 1 tablet (70 mg total) by mouth every 7 (seven) days. Take with a full glass of water on an empty stomach.    Dispense:  12 tablet     Refill:  3    Return in about 6 months (around 10/13/2018) for follow-up chronic medical conditions SANTIAGO.   Viney Acocella Paulita Fujita,  M.D. Primary Care at [facility] previously [facility] [address] [place], [place]  [address] [phone] phone [phone] fax

## 2018-04-13 NOTE — Patient Instructions (Addendum)
   IF you received an x-ray today, you will receive an invoice from [facility]. Please contact [facility] at [phone] with questions or concerns regarding your invoice.   IF you received labwork today, you will receive an invoice from [facility]. Please contact [facility] at [phone] with questions or concerns regarding your invoice.   Our billing staff will not be able to assist you with questions regarding bills from these companies.  You will be contacted with the lab results as soon as they are available. The fastest way to get your results is to activate your My Chart account. Instructions are located on the last page of this paperwork. If you have not heard from us regarding the results in 2 weeks, please contact this office.      Preventive Care 65 Years and Older, Female Preventive care refers to lifestyle choices and visits with your health care provider that can promote health and wellness. What does preventive care include?  A yearly physical exam. This is also called an annual well check.  Dental exams once or twice a year.  Routine eye exams. Ask your health care provider how often you should have your eyes checked.  Personal lifestyle choices, including: ? Daily care of your teeth and gums. ? Regular physical activity. ? Eating a healthy diet. ? Avoiding tobacco and drug use. ? Limiting alcohol use. ? Practicing safe sex. ? Taking low-dose aspirin every day. ? Taking vitamin and mineral supplements as recommended by your health care provider. What happens during an annual well check? The services and screenings done by your health care provider during your annual well check will depend on your age, overall health, lifestyle risk factors, and family history of disease. Counseling Your health care provider may ask you questions about your:  Alcohol use.  Tobacco use.  Drug use.  Emotional well-being.  Home and relationship  well-being.  Sexual activity.  Eating habits.  History of falls.  Memory and ability to understand (cognition).  Work and work environment.  Reproductive health.  Screening You may have the following tests or measurements:  Height, weight, and BMI.  Blood pressure.  Lipid and cholesterol levels. These may be checked every 5 years, or more frequently if you are over 50 years old.  Skin check.  Lung cancer screening. You may have this screening every year starting at age 55 if you have a 30-pack-year history of smoking and currently smoke or have quit within the past 15 years.  Fecal occult blood test (FOBT) of the stool. You may have this test every year starting at age 50.  Flexible sigmoidoscopy or colonoscopy. You may have a sigmoidoscopy every 5 years or a colonoscopy every 10 years starting at age 50.  Hepatitis C blood test.  Hepatitis B blood test.  Sexually transmitted disease (STD) testing.  Diabetes screening. This is done by checking your blood sugar (glucose) after you have not eaten for a while (fasting). You may have this done every 1-3 years.  Bone density scan. This is done to screen for osteoporosis. You may have this done starting at age 65.  Mammogram. This may be done every 1-2 years. Talk to your health care provider about how often you should have regular mammograms.  Talk with your health care provider about your test results, treatment options, and if necessary, the need for more tests. Vaccines Your health care provider may recommend certain vaccines, such as:  Influenza vaccine. This is recommended every year.    Tetanus, diphtheria, and acellular pertussis (Tdap, Td) vaccine. You may need a Td booster every 10 years.  Varicella vaccine. You may need this if you have not been vaccinated.  Zoster vaccine. You may need this after age 60.  Measles, mumps, and rubella (MMR) vaccine. You may need at least one dose of MMR if you were born in  1957 or later. You may also need a second dose.  Pneumococcal 13-valent conjugate (PCV13) vaccine. One dose is recommended after age 65.  Pneumococcal polysaccharide (PPSV23) vaccine. One dose is recommended after age 65.  Meningococcal vaccine. You may need this if you have certain conditions.  Hepatitis A vaccine. You may need this if you have certain conditions or if you travel or work in places where you may be exposed to hepatitis A.  Hepatitis B vaccine. You may need this if you have certain conditions or if you travel or work in places where you may be exposed to hepatitis B.  Haemophilus influenzae type b (Hib) vaccine. You may need this if you have certain conditions.  Talk to your health care provider about which screenings and vaccines you need and how often you need them. This information is not intended to replace advice given to you by your health care provider. Make sure you discuss any questions you have with your health care provider. Document Released: 11/15/2015 Document Revised: 07/08/2016 Document Reviewed: 08/20/2015 Elsevier Interactive Patient Education  2018 Elsevier Inc.  

## 2018-04-13 NOTE — Telephone Encounter (Signed)
Called and spoke with patient. She states that when she was weighed yesterday at her visit the scale stated 191. She had an appointment with her PCP this morning and she weighed 186. Patient states that the weight of 198 is incorrect on our sheet. She would like this fixed. I advised patient that because this is such a small amount of difference we could not change this since we do not know what her clothes weight and other factors that play in. Nothing further needed.

## 2018-04-22 ENCOUNTER — Ambulatory Visit (INDEPENDENT_AMBULATORY_CARE_PROVIDER_SITE_OTHER): Payer: Medicare Other | Admitting: Pharmacist

## 2018-04-22 DIAGNOSIS — Z7901 Long term (current) use of anticoagulants: Secondary | ICD-10-CM | POA: Diagnosis not present

## 2018-04-22 DIAGNOSIS — I480 Paroxysmal atrial fibrillation: Secondary | ICD-10-CM | POA: Diagnosis not present

## 2018-04-22 LAB — POCT INR: INR: 2.4 (ref 2.0–3.0)

## 2018-04-25 ENCOUNTER — Telehealth: Payer: Self-pay | Admitting: Pharmacist

## 2018-04-25 NOTE — Telephone Encounter (Signed)
Patient call to notify clinic she held warfarin dose on Sunday for procedure on Wednesday. Expecting additional  Instructions.   Recommendation given toay:  INR last week = 2.4  Resume warfarin at stable dose. HOLD Wednesday dose until after procedure. Call back Th or Fr for further instructions if needed.  *Patient informed we don't hold warfarin doses prior to 1 tooth extraction*

## 2018-04-27 ENCOUNTER — Telehealth: Payer: Self-pay | Admitting: Cardiovascular Disease

## 2018-04-27 NOTE — Telephone Encounter (Signed)
Nurse did not ask about plavix, only warfarin.   Letter faxed

## 2018-04-27 NOTE — Telephone Encounter (Signed)
New Message   Laurie Horton is calling from [facility]. Patient is to come in for  Dental extraction at 11:00am. They need directions on whether or not patient can hold plavix.

## 2018-04-28 ENCOUNTER — Telehealth: Payer: Self-pay | Admitting: Pharmacist

## 2018-04-28 NOTE — Telephone Encounter (Signed)
Patient left message at [facility] phone [phone]) on 6/26 after office hours.    She had a single tooth extraction and was requesting  warfarin dose instructions instructions.   Noted instructions were faxed to dentist office on 6/26, also discussed with patient on 6/24.  Tried to call back back today 6/27 as follow up. No answer and unable to leave message at [phone].

## 2018-04-28 NOTE — Telephone Encounter (Signed)
2nd call today. No answer.

## 2018-04-29 NOTE — Telephone Encounter (Signed)
3rd call. Follow up with patient after dental extraction on 04/27/2018. No answer and unable to leave message at [phone].

## 2018-05-02 ENCOUNTER — Telehealth: Payer: Self-pay | Admitting: Cardiology

## 2018-05-02 NOTE — Telephone Encounter (Signed)
Patient called in stating that she has been in Afib all weekend. Her blood pressure on Saturday was 158/84 and heart rate was 94. She was symptomatic with feelings of fatigue but denied chest pain. This morning around 6 am she went back into normal rhythm, blood pressure was 129/81 and heart rate was 74.  Patient states she feels like everytime that she exerts herself that she goes into afib. This morning she was able to walk her dog and clean her house without any afib episodes.   An appointment has been made for her tomorrow at 87 with Rosaria Ferries, PA. She has been advised that she may go to the ED if she goes back into afib and is symptomatic but she stated that she will not because she they "charge too much". The patient is currently taking warfarin and carvedilol 12.5 mg bid.

## 2018-05-03 ENCOUNTER — Ambulatory Visit (INDEPENDENT_AMBULATORY_CARE_PROVIDER_SITE_OTHER): Payer: Medicare Other | Admitting: Physician Assistant

## 2018-05-03 ENCOUNTER — Encounter: Payer: Self-pay | Admitting: Physician Assistant

## 2018-05-03 VITALS — BP 101/62 | HR 68 | Wt 192.0 lb

## 2018-05-03 DIAGNOSIS — I5022 Chronic systolic (congestive) heart failure: Secondary | ICD-10-CM | POA: Diagnosis not present

## 2018-05-03 DIAGNOSIS — Q211 Atrial septal defect: Secondary | ICD-10-CM | POA: Diagnosis not present

## 2018-05-03 DIAGNOSIS — Q2111 Secundum atrial septal defect: Secondary | ICD-10-CM

## 2018-05-03 DIAGNOSIS — I480 Paroxysmal atrial fibrillation: Secondary | ICD-10-CM

## 2018-05-03 DIAGNOSIS — Z79899 Other long term (current) drug therapy: Secondary | ICD-10-CM

## 2018-05-03 NOTE — Patient Instructions (Signed)
Your physician recommends that you return for lab work TODAY.  Keep you appointment with Dr Martinique as scheduled.

## 2018-05-03 NOTE — Progress Notes (Signed)
Cardiology Office Note   Date:  05/03/2018   ID:  Laurie Horton, Laurie Horton 1946/10/19, MRN [id number]  PCP:  Rutherford Guys, MD  Cardiologist: Dr Martinique PAD: Dr. Gwenlyn Found, 02/02/2018 Rosaria Ferries, PA-C   Chief Complaint  Patient presents with  . Follow-up    History of Present Illness: Laurie Horton is a 72 y.o. female with a history of COPD, HTN, HLD, GAD, cath 03/2017 w/ RCA 100% and CFX dz. Takotsubo CM, mild pulm HTN, PAD s/p HSRA PTA and stent L CIA w/ improvement in claudication sx, med rx for R CFA dz (Pulm said no surgery), PAT/PAF.   7/1 phone notes regarding atrial fibrillation, appointment made  Laurie Horton presents for cardiology follow up.  She did not take her clindamycin pre-dental procedure. She had a lot of pain after her tooth was pulled. Thinks that caused her palpitations.  She had palpitations all weekend. They were bad, made her feel dizzy. Her neighbor helped her but could not get a pulse (retired Therapist, sports). She laid down and felt better. She took her BP/HR a couple of times, her HR was in the 90s and SBP was 140s-150s. Yesterday, she felt better, and her HR was in the 70s, BP well-controlled.  She also developed upper R chest pain 06/29 that radiates to her R scapula. At its worst, it was a 12/10. It hurts worse to take a deep breath. She did not try any rx for it. Last pm, she took gabapentin, hydrocodone, trazodone 50 mg and Zyrtec. The pain then went to a 0/10, but was there the next morning. She has been able to sleep and the pain resolves. It always starts back during the day. Right now, it is a 7/10.   She weighs daily. She goes to bed early and gets up a 3 am.   She does not wake with LE edema. She denies orthopnea or PND, sleeps with O2.   She is struggling with physical issues. She was having trouble lifting with her R arm today. She sleeps on that side. She sometimes wakes w/ L arm numbness.     Past Medical History:  Diagnosis Date  . CHF  (congestive heart failure) ([place]) 03/02/2017   EF 25-30% 2018  . Complication of anesthesia    various issues with oxygen  saturations post op  . COPD (chronic obstructive pulmonary disease) ([place])   . Depression   . Diverticulitis   . DVT (deep venous thrombosis) ([place])   . Fibromyalgia   . GERD (gastroesophageal reflux disease)   . Hiatal hernia   . History of deviated nasal septum    left- side  . HTN (hypertension)   . Hyperlipidemia   . Obesity   . On supplemental oxygen therapy    concentrator at night @ 1.5 l/m or when sleeps. O2 Sat niormally 87.  . Osteoporosis   . PAT (paroxysmal atrial tachycardia) ([place])   . PFO (patent foramen ovale)   . PULMONARY NODULE, LEFT LOWER LOBE 10/14/2009   87mm LLL nodule dec 2010. Stable and 69mm in Oct 2012. No further fu  . PVD (peripheral vascular disease) with claudication ([place]) 12/2017  . Right middle lobe pneumonia ([place]) 07/24/2011   First noted at admit 07/10/11. Persists on cxr 07/22/11. Cleared on CT 08/24/11. No further followup  . Stroke ([place])   . TOBACCO ABUSE 06/04/2009    Past Surgical History:  Procedure Laterality Date  . CARDIOVASCULAR STRESS TEST  12/26/2004  EF 74%. NO EVIDENCE OF ISCHEMIA  . ESOPHAGOGASTRODUODENOSCOPY (EGD) WITH PROPOFOL N/A 04/15/2015   Procedure: ESOPHAGOGASTRODUODENOSCOPY (EGD) WITH PROPOFOL;  Surgeon: Laurence Spates, MD;  Location: [facility];  Service: Endoscopy;  Laterality: N/A;  . KNEE ARTHROSCOPY  2000   left  . LAPAROSCOPIC CHOLECYSTECTOMY  04-16-2010   [facility]  . LOWER EXTREMITY ANGIOGRAPHY N/A 09/09/2017   Procedure: Lower Extremity Angiography;  Surgeon: Lorretta Harp, MD;  Location: [facility];  Service: Cardiovascular;  Laterality: N/A;  . LOWER EXTREMITY INTERVENTION Left 01/17/2018   Procedure: LOWER EXTREMITY INTERVENTION;  Surgeon: Lorretta Harp, MD;  Location: [facility];  Service: Cardiovascular;  Laterality: Left;  . MOUTH SURGERY     03-26-15 multiple extractions  stitches remains  . PERIPHERAL VASCULAR INTERVENTION Left 01/17/2018   Procedure: PERIPHERAL VASCULAR INTERVENTION;  Surgeon: Lorretta Harp, MD;  Location: [facility];  Service: Cardiovascular;  Laterality: Left;  COMMON ILIAC  . RIGHT/LEFT HEART CATH AND CORONARY ANGIOGRAPHY N/A 03/04/2017   Procedure: Right/Left Heart Cath and Coronary Angiography;  Surgeon: Peter M Martinique, MD;  Location: [facility];  Service: Cardiovascular;  Laterality: N/A;  . TOTAL ABDOMINAL HYSTERECTOMY     post op needed oxygen was told "she gave them a scare"  . TUBAL LIGATION    . US ECHOCARDIOGRAPHY  11/20/2009   EF 55-60%    Current Outpatient Medications  Medication Sig Dispense Refill  . alendronate (FOSAMAX) 70 MG tablet Take 1 tablet (70 mg total) by mouth every 7 (seven) days. Take with a full glass of water on an empty stomach. 12 tablet 3  . carvedilol (COREG) 12.5 MG tablet Take 1 tablet (12.5 mg total) by mouth 2 (two) times daily. 60 tablet 11  . clopidogrel (PLAVIX) 75 MG tablet Take 1 tablet (75 mg total) by mouth daily with breakfast. 30 tablet 1  . desvenlafaxine (PRISTIQ) 50 MG 24 hr tablet Take 1 tablet (50 mg total) by mouth daily. 30 tablet 2  . diazepam (VALIUM) 2 MG tablet Take 1 tablet (2 mg total) by mouth daily as needed for anxiety. 10 tablet 0  . furosemide (LASIX) 40 MG tablet Take 1 tablet (40 mg total) by mouth daily. 30 tablet 11  . gabapentin (NEURONTIN) 100 MG capsule Take 100-200 mg by mouth See admin instructions. 100 mg in am, 200 mg at noon and  200 mg at bedtime    . HYDROcodone-acetaminophen (NORCO/VICODIN) 5-325 MG tablet Take 1 tablet by mouth 3 (three) times daily.     [place] Kitchen ipratropium (ATROVENT) 0.02 % nebulizer solution USE 1 VIAL BY NEBULIZATION 4 (FOUR) TIMES DAILY. (Patient taking differently: USE 1 VIAL BY NEBULIZATION  3 TIMES DAILY AS NEEDED FOR SHORTNESS OF BREATH) 75 mL 3  . losartan (COZAAR) 25 MG tablet Take 1 tablet (25 mg total) by mouth daily. 30  tablet 11  . montelukast (SINGULAIR) 10 MG tablet TAKE 1 TABLET BY MOUTH EVERYDAY AT BEDTIME 90 tablet 3  . ondansetron (ZOFRAN ODT) 8 MG disintegrating tablet Take 1 tablet (8 mg total) by mouth every 8 (eight) hours as needed for nausea or vomiting. 20 tablet 0  . oxybutynin (DITROPAN-XL) 10 MG 24 hr tablet TAKE 10 MG BY MOUTH EVERY DAY 90 tablet 3  . OXYGEN Inhale 1.5-2 L into the lungs as needed (for shortness of breath).    . pantoprazole (PROTONIX) 40 MG tablet Take 40 mg by mouth daily.     . polyethylene glycol (MIRALAX /  GLYCOLAX) packet Take 17 g by mouth every other day.     . rosuvastatin (CRESTOR) 5 MG tablet Take 1 tablet (5 mg total) by mouth 2 (two) times a week. Sunday and Wednesday 24 tablet 3  . spironolactone (ALDACTONE) 25 MG tablet Take 1 tablet (25 mg total) by mouth daily. 30 tablet 11  . tiZANidine (ZANAFLEX) 2 MG tablet Take 2 mg by mouth at bedtime.     . traZODone (DESYREL) 100 MG tablet Take 1 tablet (100 mg total) by mouth at bedtime. 30 tablet 2  . triamcinolone ointment (KENALOG) 0.1 % Apply 1 application topically at bedtime as needed (for right ear irritation).     . warfarin (COUMADIN) 5 MG tablet TAKE 1/2 TO 1 TABLET BY MOUTH EVERY DAY OR AS DIRECTED BY COUMADIN CLINIC (Patient taking differently: Take 2.5-5 mg by mouth See admin instructions. Take 5 mg by mouth daily on Monday and Friday. Take 2.5 mg by mouth daily on all other days.) 90 tablet 0   No current facility-administered medications for this visit.     Allergies:   Alprazolam; Bee venom; Iodine; Pseudoephedrine hcl er; Budesonide-formoterol fumarate; Crestor [rosuvastatin calcium]; Esomeprazole magnesium; Flonase [fluticasone propionate]; Loratadine; Lotrimin [clotrimazole]; Lunesta [eszopiclone]; Oxcarbazepine; Statins; Zolpidem tartrate; Betadine [povidone iodine]; Clarithromycin; Effexor [venlafaxine]; Lexapro [escitalopram oxalate]; Aciphex [rabeprazole sodium]; Alendronate sodium; Avelox  [moxifloxacin hcl in nacl]; Bextra [valdecoxib]; Ceclor [cefaclor]; Cephalexin; Covera-hs [verapamil hcl]; Dicyclomine hcl; Other; and Tessalon perles    Social History:  The patient  reports that she quit smoking about 7 years ago. Her smoking use included cigarettes. She has a 90.00 pack-year smoking history. She has never used smokeless tobacco. She reports that she does not drink alcohol or use drugs.   Family History:  The patient's family history includes Alzheimer's disease in her mother; Dementia in her mother; Diabetes in her mother and sister; Heart attack in her father; Heart attack (age of onset: 62) in her brother; Schizophrenia in her sister; Tremor in her sister.    ROS:  Please see the history of present illness. All other systems are reviewed and negative.    PHYSICAL EXAM: VS:  BP 101/62   Pulse 68   Wt 192 lb (87.1 kg)   BMI 33.18 kg/m  , BMI Body mass index is 33.18 kg/m. GEN: Well nourished, well developed, female in no acute distress  HEENT: normal for age  Neck: no JVD, no carotid bruit, no masses Cardiac: RRR; soft murmur, no rubs, or gallops Respiratory:  clear to auscultation bilaterally, normal work of breathing GI: soft, nontender, nondistended, + BS MS: no deformity or atrophy; no edema; distal pulses are 2+ in all 4 extremities   Skin: warm and dry, no rash Neuro:  Strength and sensation are intact Psych: euthymic mood, full affect   EKG:  EKG is ordered today. The ekg ordered today demonstrates sinus rhythm, heart rate 63 no acute ischemic changes, Q waves in lead III are unchanged, anterolateral T wave inversions are slightly improved from previous. **Twelve-lead rhythm strip reviewed, she is in sinus rhythm with regularly occurring PACs   Recent Labs: 01/11/2018: TSH 4.560 04/13/2018: ALT 10; BUN 17; Creatinine, Ser 1.17; Hemoglobin 13.6; Platelets 262; Potassium 4.6; Sodium 138    Lipid Panel    Component Value Date/Time   CHOL 183 04/13/2018  0957   TRIG 158 (H) 04/13/2018 0957   HDL 41 04/13/2018 0957   CHOLHDL 4.5 (H) 04/13/2018 0957   CHOLHDL 4.6 07/02/2016 0823   VLDL  25 07/02/2016 0823   LDLCALC 110 (H) 04/13/2018 0957     Wt Readings from Last 3 Encounters:  05/03/18 192 lb (87.1 kg)  04/13/18 186 lb (84.4 kg)  04/12/18 198 lb 9.6 oz (90.1 kg)     Other studies Reviewed: Additional studies/ records that were reviewed today include: Office notes, hospital records and testing.  ASSESSMENT AND PLAN:  1.  Palpitations: She has a history of PAF for which she is on Coumadin, and is currently having PACs. - Over the weekend, her blood pressure and heart rate were both elevated. -Even though she was symptomatic, she did not seek help - I explained that even though she may have been in atrial fibrillation this weekend, as long as she is anticoagulated, no treatment changes needed.  -The possible atrial fibrillation occurred in the setting of significant pain from a dental extraction - If this happens again, if she is symptomatic, she is encouraged to contact us.  She has multiple medication allergies and intolerances, and is generally stable, so I will not make any changes at this time. -Continue Coreg 12.5 mg twice daily -Check electrolytes and a magnesium level  2.  CAD: No ongoing ischemic symptoms -Continue Plavix, no aspirin because of Coumadin, beta-blocker but no statin due to intolerance  3.  Hyperlipidemia, goal LDL less than 70 - A recent lipid profile showed an LDL of 110 -Compliance with a low-cholesterol diet is encouraged.  4.  Hypertension: -Although her blood pressure was elevated this weekend during her symptoms, it is generally well controlled.  Readings from her home blood pressure cuff were reviewed -No medication changes.  5.  Recent dental procedure: - Patient states she normally takes antibiotic prophylaxis preprocedure because of a "hole in her heart". - Echo is going back to 2015 are  reviewed and no PFO was mentioned - However, she is very concerned because she did not have antibiotics and did not take them prior to the procedure. - Check a CBC, contact the dentist or her PCP if she does develop fever or chills   Current medicines are reviewed at length with the patient today.  The patient does not have concerns regarding medicines.  The following changes have been made:  no change  Labs/ tests ordered today include:   Orders Placed This Encounter  Procedures  . Basic metabolic panel  . CBC  . Magnesium  . EKG 12-Lead  . RHYTHM ECG, REPORT     Disposition:   FU with Dr. Gwenlyn Found  Signed, Rosaria Ferries, PA-C  05/03/2018 4:02 PM    [facility] Phone: [phone]; Fax: [phone]  This note was written with the assistance of speech recognition software. Please excuse any transcriptional errors.

## 2018-05-04 LAB — CBC
Hematocrit: 39.6 % (ref 34.0–46.6)
Hemoglobin: 12.5 g/dL (ref 11.1–15.9)
MCH: 29.1 pg (ref 26.6–33.0)
MCHC: 31.6 g/dL (ref 31.5–35.7)
MCV: 92 fL (ref 79–97)
PLATELETS: 237 10*3/uL (ref 150–450)
RBC: 4.3 x10E6/uL (ref 3.77–5.28)
RDW: 14.3 % (ref 12.3–15.4)
WBC: 7 10*3/uL (ref 3.4–10.8)

## 2018-05-04 LAB — BASIC METABOLIC PANEL
BUN/Creatinine Ratio: 17 (ref 12–28)
BUN: 17 mg/dL (ref 8–27)
CO2: 24 mmol/L (ref 20–29)
CREATININE: 0.98 mg/dL (ref 0.57–1.00)
Calcium: 9.6 mg/dL (ref 8.7–10.3)
Chloride: 94 mmol/L — ABNORMAL LOW (ref 96–106)
GFR calc Af Amer: 67 mL/min/{1.73_m2} (ref >59)
GFR, EST NON AFRICAN AMERICAN: 58 mL/min/{1.73_m2} — AB (ref >59)
Glucose: 101 mg/dL — ABNORMAL HIGH (ref 65–99)
Potassium: 4.5 mmol/L (ref 3.5–5.2)
SODIUM: 136 mmol/L (ref 134–144)

## 2018-05-04 LAB — MAGNESIUM: Magnesium: 2 mg/dL (ref 1.6–2.3)

## 2018-05-12 ENCOUNTER — Other Ambulatory Visit: Payer: Self-pay | Admitting: Cardiology

## 2018-05-20 LAB — COLOGUARD: Cologuard: NEGATIVE

## 2018-05-25 ENCOUNTER — Other Ambulatory Visit (HOSPITAL_COMMUNITY): Payer: Self-pay

## 2018-05-25 DIAGNOSIS — F411 Generalized anxiety disorder: Secondary | ICD-10-CM

## 2018-05-25 MED ORDER — DIAZEPAM 2 MG PO TABS: 2.0000 mg | ORAL_TABLET | Freq: Every day | ORAL | 0 refills | Status: DC | PRN

## 2018-05-26 ENCOUNTER — Ambulatory Visit (HOSPITAL_COMMUNITY): Payer: Self-pay | Admitting: Psychiatry

## 2018-06-01 ENCOUNTER — Other Ambulatory Visit (HOSPITAL_COMMUNITY): Payer: Self-pay | Admitting: Psychiatry

## 2018-06-01 DIAGNOSIS — F331 Major depressive disorder, recurrent, moderate: Secondary | ICD-10-CM

## 2018-06-03 ENCOUNTER — Ambulatory Visit (INDEPENDENT_AMBULATORY_CARE_PROVIDER_SITE_OTHER): Payer: Medicare Other | Admitting: Pharmacist

## 2018-06-03 DIAGNOSIS — I480 Paroxysmal atrial fibrillation: Secondary | ICD-10-CM

## 2018-06-03 DIAGNOSIS — Z7901 Long term (current) use of anticoagulants: Secondary | ICD-10-CM

## 2018-06-03 LAB — POCT INR: INR: 1.5 — AB (ref 2.0–3.0)

## 2018-06-08 ENCOUNTER — Other Ambulatory Visit (HOSPITAL_COMMUNITY): Payer: Self-pay

## 2018-06-08 DIAGNOSIS — F331 Major depressive disorder, recurrent, moderate: Secondary | ICD-10-CM

## 2018-06-08 MED ORDER — DESVENLAFAXINE SUCCINATE ER 50 MG PO TB24: 50.0000 mg | ORAL_TABLET | Freq: Every day | ORAL | 2 refills | Status: DC

## 2018-06-08 MED ORDER — TRAZODONE HCL 100 MG PO TABS: 100.0000 mg | ORAL_TABLET | Freq: Every day | ORAL | 2 refills | Status: DC

## 2018-06-13 ENCOUNTER — Other Ambulatory Visit: Payer: Self-pay | Admitting: Physician Assistant

## 2018-06-13 NOTE — Telephone Encounter (Signed)
This is Dr. Jordan's pt. °

## 2018-06-15 ENCOUNTER — Other Ambulatory Visit: Payer: Self-pay | Admitting: Cardiology

## 2018-06-15 NOTE — Telephone Encounter (Signed)
Rx request sent to pharmacy.  

## 2018-06-20 ENCOUNTER — Other Ambulatory Visit: Payer: Self-pay | Admitting: Cardiology

## 2018-06-20 NOTE — Telephone Encounter (Signed)
Rx sent to pharmacy   

## 2018-06-20 NOTE — Telephone Encounter (Signed)
This is Dr. Jordan's pt. °

## 2018-06-21 ENCOUNTER — Telehealth: Payer: Self-pay | Admitting: Cardiology

## 2018-06-21 ENCOUNTER — Ambulatory Visit (INDEPENDENT_AMBULATORY_CARE_PROVIDER_SITE_OTHER): Payer: Medicare Other | Admitting: Pharmacist

## 2018-06-21 DIAGNOSIS — Z7901 Long term (current) use of anticoagulants: Secondary | ICD-10-CM

## 2018-06-21 DIAGNOSIS — I480 Paroxysmal atrial fibrillation: Secondary | ICD-10-CM | POA: Diagnosis not present

## 2018-06-21 LAB — POCT INR: INR: 2.9 (ref 2.0–3.0)

## 2018-06-21 NOTE — Telephone Encounter (Signed)
Returned call to patient.She stated she noticed at bottom of paperwork that was just given to her at coumadin clinic it says to stop all of her medications.Spoke to pharmacist Erasmo Downer she advised patient's chart was cleaned up she had 5 duplicate medications.Patient advised to take medications as prescribed.

## 2018-06-21 NOTE — Telephone Encounter (Signed)
New Message:  Patient just left for IR at 7:45 am when she went to her other appt she look at paper work and some medication where discontinue and wants to know why.

## 2018-06-24 ENCOUNTER — Other Ambulatory Visit: Payer: Self-pay | Admitting: Cardiology

## 2018-07-02 ENCOUNTER — Other Ambulatory Visit: Payer: Self-pay | Admitting: Cardiovascular Disease

## 2018-07-05 ENCOUNTER — Other Ambulatory Visit: Payer: Self-pay | Admitting: Cardiovascular Disease

## 2018-07-05 ENCOUNTER — Ambulatory Visit (HOSPITAL_COMMUNITY): Payer: Self-pay | Admitting: Psychiatry

## 2018-07-05 DIAGNOSIS — Z95820 Peripheral vascular angioplasty status with implants and grafts: Secondary | ICD-10-CM

## 2018-07-05 DIAGNOSIS — I739 Peripheral vascular disease, unspecified: Secondary | ICD-10-CM

## 2018-07-16 NOTE — Progress Notes (Signed)
Cardiology Office Note    Date:  07/21/2018   ID:  Laurie, Horton 1946/05/24, MRN [id number]  PCP:  [name], MD  Cardiologist: Dr. [place]az[place]   Chief Complaint  Patient presents with  . Shortness of Breath    History of Present Illness:    Laurie Horton is a 72 y.o. female with past medical history of chronic diastolic CHF, PFO, PAF (on Coumadin), COPD (on 2L [place] at baseline), HTN, and prior CVA who is seen for follow up CHF.  She was admitted from 4/9 - 02/12/2017 for worsening dyspnea on exertion and palpitations. Was in atrial fibrillation with RVR upon arrival to the ED. Echo during admission showed a newly reduced EF of 20-25% and she was diuresed with IV Lasix. Enzymes were negative and EKG showed no acute ischemic changes. It was recommended to consider a right/left heart cath in 4-6 weeks.   She did undergo right and left heart cath on 03/04/17. This showed severe 2 vessel obstructive CAD with 100% RCA occlusion, 75% OM1, and 90% small OM2. EF 25-30%. Mild pulmonary HTN with normal LV filling pressures. 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 Maximizing CHF therapy recommended.  She did have repeat Echo in September 2018 showing improvement in EF to 50-55%.   Subsequent to this she developed significant claudication. She was seen by Dr. Allyson Sabal and had angiography showing 80% infrarenal aortic stenosis and left iliac stenosis. She also had severe right common femoral stenosis. She was seen by Dr Myra Gianotti for consideration of Aortobifemoral bypass. After pulmonary evaluation she was felt to be too high a risk for open surgery. In March 2019 she underwent atherectomy and covered stenting of the left iliac by Dr. Allyson Sabal. On follow up she did have improvement in her claudication and ABIs. The aortic and right common femoral artery stenoses are not felt to be amenable to percutaneous therapy.   In early July she was seen because she felt she was in Afib following dental  procedure. On arrival she was in NSR with PACs. No medical changes made.   She was seen by Dr. Marchelle Gearing on 07/18/18 with complaints of cough with sputum production and increased dyspnea. She was placed on a medrol dose pack, doxycycline, and inhaler was switched to Bevespi. CT chest was ordered.   Today she complains of cough productive of white sputum and "chunks". No edema. Throat is raw with change in inhaler. Feels sick to her stomach. Sometimes feels heart pounding out of her chest but otherwise no chest pain. Is under a lot of stress.     Past Medical History:  Diagnosis Date  . CHF (congestive heart failure) (HCC) 03/02/2017   EF 25-30% 2018  . Complication of anesthesia    various issues with oxygen  saturations post op  . COPD (chronic obstructive pulmonary disease) (HCC)   . Depression   . Diverticulitis   . DVT (deep venous thrombosis) (HCC)   . Fibromyalgia   . GERD (gastroesophageal reflux disease)   . Hiatal hernia   . History of deviated nasal septum    left- side  . HTN (hypertension)   . Hyperlipidemia   . Obesity   . On supplemental oxygen therapy    concentrator at night @ 1.5 l/m or when sleeps. O2 Sat niormally 87.  . Osteoporosis   . PAT (paroxysmal atrial tachycardia) (HCC)   . PFO (patent foramen ovale)   . PULMONARY NODULE, LEFT LOWER LOBE 10/14/2009  5mm LLL nodule dec 2010. Stable and 4mm in Oct 2012. No further fu  . PVD (peripheral vascular disease) with claudication (HCC) 12/2017  . Right middle lobe pneumonia (HCC) 07/24/2011   First noted at admit 07/10/11. Persists on cxr 07/22/11. Cleared on CT 08/24/11. No further followup  . Stroke (HCC)   . TOBACCO ABUSE 06/04/2009    Past Surgical History:  Procedure Laterality Date  . CARDIOVASCULAR STRESS TEST  12/26/2004   EF 74%. NO EVIDENCE OF ISCHEMIA  . ESOPHAGOGASTRODUODENOSCOPY (EGD) WITH PROPOFOL N/A 04/15/2015   Procedure: ESOPHAGOGASTRODUODENOSCOPY (EGD) WITH PROPOFOL;  Surgeon: Carman Ching,  MD;  Location: [facility];  Service: Endoscopy;  Laterality: N/A;  . KNEE ARTHROSCOPY  2000   left  . LAPAROSCOPIC CHOLECYSTECTOMY  04-16-2010   [place]  . LOWER EXTREMITY ANGIOGRAPHY N/A 09/09/2017   Procedure: Lower Extremity Angiography;  Surgeon: Runell Gess, MD;  Location: [facility] INVASIVE CV LAB;  Service: Cardiovascular;  Laterality: N/A;  . LOWER EXTREMITY INTERVENTION Left 01/17/2018   Procedure: LOWER EXTREMITY INTERVENTION;  Surgeon: Runell Gess, MD;  Location: [facility] INVASIVE CV LAB;  Service: Cardiovascular;  Laterality: Left;  . MOUTH SURGERY     03-26-15 multiple extractions stitches remains  . PERIPHERAL VASCULAR INTERVENTION Left 01/17/2018   Procedure: PERIPHERAL VASCULAR INTERVENTION;  Surgeon: Runell Gess, MD;  Location: [facility] INVASIVE CV LAB;  Service: Cardiovascular;  Laterality: Left;  COMMON ILIAC  . RIGHT/LEFT HEART CATH AND CORONARY ANGIOGRAPHY N/A 03/04/2017   Procedure: Right/Left Heart Cath and Coronary Angiography;  Surgeon: Caysen Whang M Swazil[place], MD;  Location: [facility] INVASIVE CV LAB;  Service: Cardiovascular;  Laterality: N/A;  . TOTAL ABDOMINAL HYSTERECTOMY     post op needed oxygen was told "she gave them a scare"  . TUBAL LIGATION    . US ECHOCARDIOGRAPHY  11/20/2009   EF 55-60%    Current Medications: Outpatient Medications Prior to Visit  Medication Sig Dispense Refill  . carvedilol (COREG) 12.5 MG tablet TAKE 1 TABLET (12.5 MG TOTAL) BY MOUTH 2 (TWO) TIMES DAILY. 60 tablet 11  . clopidogrel (PLAVIX) 75 MG tablet TAKE 1 TABLET (75 MG TOTAL) BY MOUTH DAILY WITH BREAKFAST. 30 tablet 1  . desvenlafaxine (PRISTIQ) 50 MG 24 hr tablet Take 1 tablet (50 mg total) by mouth daily. 30 tablet 2  . diazepam (VALIUM) 2 MG tablet Take 1 tablet (2 mg total) by mouth daily as needed for anxiety. 10 tablet 0  . diclofenac sodium (VOLTAREN) 1 % GEL APPLY 2 GRAMS TO BOTH HANDS 4 TIMES A DAY  5  . doxycycline (VIBRA-TABS) 100 MG tablet Take 1 tablet (100 mg total) by mouth 2 (two)  times daily. 10 tablet 0  . Eyelid Cleansers (OCUSOFT BABY EYELID & EYELASH EX) Apply topically.    . furosemide (LASIX) 40 MG tablet TAKE 1 TABLET BY MOUTH EVERY DAY 30 tablet 11  . gabapentin (NEURONTIN) 100 MG capsule Take 100-200 mg by mouth See admin instructions. 100 mg in am, 200 mg at noon and  200 mg at bedtime    . Glycopyrrolate-Formoterol (BEVESPI AEROSPHERE) 9-4.8 MCG/ACT AERO Inhale 2 puffs into the lungs 2 (two) times daily. 1 Inhaler 0  . HYDROcodone-acetaminophen (NORCO/VICODIN) 5-325 MG tablet Take 1 tablet by mouth 3 (three) times daily.     Marland Kitchen ipratropium (ATROVENT) 0.02 % nebulizer solution USE 1 VIAL BY NEBULIZATION 4 (FOUR) TIMES DAILY. (Patient taking differently: USE 1 VIAL BY NEBULIZATION  3 TIMES DAILY AS NEEDED FOR SHORTNESS OF BREATH) 75 mL  3  . losartan (COZAAR) 25 MG tablet TAKE 1 TABLET BY MOUTH EVERY DAY 30 tablet 11  . montelukast (SINGULAIR) 10 MG tablet TAKE 1 TABLET BY MOUTH EVERYDAY AT BEDTIME 90 tablet 3  . ondansetron (ZOFRAN ODT) 8 MG disintegrating tablet Take 1 tablet (8 mg total) by mouth every 8 (eight) hours as needed for nausea or vomiting. 20 tablet 0  . oxybutynin (DITROPAN-XL) 10 MG 24 hr tablet TAKE 10 MG BY MOUTH EVERY DAY 90 tablet 3  . OXYGEN Inhale 1.5-2 L into the lungs as needed (for shortness of breath).    . pantoprazole (PROTONIX) 40 MG tablet Take 40 mg by mouth daily.     . polyethylene glycol (MIRALAX / GLYCOLAX) packet Take 17 g by mouth every other day.     . predniSONE (DELTASONE) 10 MG tablet Take 40mg x1day,30mg x1day,20mg x1day,10mg x1day,5mg x1day,then stop 11 tablet 0  . rosuvastatin (CRESTOR) 5 MG tablet Take 1 tablet (5 mg total) by mouth 2 (two) times a week. Sunday and Wednesday 24 tablet 3  . spironolactone (ALDACTONE) 25 MG tablet TAKE 1 TABLET BY MOUTH EVERY DAY 30 tablet 8  . tiZANidine (ZANAFLEX) 2 MG tablet Take 2 mg by mouth at bedtime.     . traZODone (DESYREL) 100 MG tablet Take 1 tablet (100 mg total) by mouth at  bedtime. 30 tablet 2  . warfarin (COUMADIN) 5 MG tablet TAKE 1/2 TO 1 TABLET BY MOUTH EVERY DAY OR AS DIRECTED BY COUMADIN CLINIC 11/13/17 90 tablet 0  . albuterol (PROVENTIL HFA;VENTOLIN HFA) 108 (90 Base) MCG/ACT inhaler Inhale 2 puffs into the lungs every 6 (six) hours as needed for wheezing or shortness of breath. (Patient not taking: Reported on 07/21/2018) 1 Inhaler 6   No facility-administered medications prior to visit.      Allergies:   Alprazolam; Bee venom; Iodine; Pseudoephedrine hcl er; Budesonide-formoterol fumarate; Crestor [rosuvastatin calcium]; Esomeprazole magnesium; Flonase [fluticasone propionate]; Loratadine; Lotrimin [clotrimazole]; Lunesta [eszopiclone]; Oxcarbazepine; Statins; Zolpidem tartrate; Betadine [povidone iodine]; Clarithromycin; Effexor [venlafaxine]; Lexapro [escitalopram oxalate]; Aciphex [rabeprazole sodium]; Alendronate sodium; Avelox [moxifloxacin hcl in nacl]; Bextra [valdecoxib]; Ceclor [cefaclor]; Cephalexin; Covera-hs [verapamil hcl]; Dicyclomine hcl; Other; and Tessalon perles   Social History   Socioeconomic History  . Marital status: Divorced    Spouse name: Not on file  . Number of children: 2  . Years of education: Not on file  . Highest education level: Not on file  Occupational History  . Occupation: disability  Social Needs  . Financial resource strain: Not on file  . Food insecurity:    Worry: Not on file    Inability: Not on file  . Transportation needs:    Medical: Not on file    Non-medical: Not on file  Tobacco Use  . Smoking status: Former Smoker    Packs/day: 3.00    Years: 30.00    Pack years: 90.00    Types: Cigarettes    Last attempt to quit: 06/02/2010    Years since quitting: 8.1  . Smokeless tobacco: Never Used  . Tobacco comment: QUIT IN 2011  Substance and Sexual Activity  . Alcohol use: No    Alcohol/week: 0.0 standard drinks  . Drug use: No  . Sexual activity: Yes    Partners: Male    Birth control/protection:  Post-menopausal, Surgical  Lifestyle  . Physical activity:    Days per week: Not on file    Minutes per session: Not on file  . Stress: Not on file  Relationships  . Social  connections:    Talks on phone: Not on file    Gets together: Not on file    Attends religious service: Not on file    Active member of club or organization: Not on file    Attends meetings of clubs or organizations: Not on file    Relationship status: Not on file  Other Topics Concern  . Not on file  Social History Narrative   Marital status: divorced in 1978 after ten years; dating casually in 2019.      Children: 3 biological children; 3 court appointed children; 6 grandchildren; 5 gg      Lives: alone; children in [place] and [place].      Employment: retired age 67; disability for COPD, CVA at age 63.      Tobacco: former smoker; quit smoking 2011.      Alcohol: none      Exercise: walks dog four times per day; goes to pool three times per week.      ADLs: drives; no assistant devices; does have a walker.  Cleaning is limited in 2018.  Daughter helps with cleaning.  Does own grocery shopping.      Advanced Directives: YES; DNR/DNI; HCPOA: [name] Martin/daughter youngest.  Blind.                 Family History:  The patient's family history includes Alzheimer's disease in her mother; Dementia in her mother; Diabetes in her mother and sister; Heart attack in her father; Heart attack (age of onset: 55) in her brother; Schizophrenia in her sister; Tremor in her sister.   Review of Systems:   As noted in HPI.  All other systems reviewed and are otherwise negative except as noted above.   Physical Exam:    VS:  BP 116/68   Pulse 68   Ht 5\' 4"  (1.626 m)   Wt 193 lb 6.4 oz (87.7 kg)   BMI 33.20 kg/m    General: Well developed, well nourished Caucasian female appearing in no acute distress.  Head: Normocephalic, atraumatic, sclera non-icteric, no xanthomas, nares are without discharge.    Neck: No carotid bruits. JVD not elevated.  Lungs: few crackles in right base Heart: Regular rate and rhythm. No S3 or S4.  No murmur, no rubs, or gallops appreciated. Abdomen: Soft, non-tender, non-distended with normoactive bowel sounds. No hepatomegaly. No rebound/guarding. No obvious abdominal masses. Msk:  Strength and tone appear normal for age. No joint deformities or effusions. Extremities: No clubbing or cyanosis. No edema.  Distal pedal pulses are poor. Neuro: Alert and oriented X 3. Moves all extremities spontaneously. No focal deficits noted. Psych:  Responds to questions appropriately with a normal affect. Skin: No rashes or lesions noted  Wt Readings from Last 3 Encounters:  07/21/18 193 lb 6.4 oz (87.7 kg)  07/18/18 191 lb 9.6 oz (86.9 kg)  05/03/18 192 lb (87.1 kg)     Studies/Labs Reviewed:   EKG:  EKG is not ordered today.   Recent Labs: 01/11/2018: TSH 4.560 04/13/2018: ALT 10 05/03/2018: BUN 17; Creatinine, Ser 0.98; Hemoglobin 12.5; Magnesium 2.0; Platelets 237; Potassium 4.5; Sodium 136   Lipid Panel    Component Value Date/Time   CHOL 183 04/13/2018 0957   TRIG 158 (H) 04/13/2018 0957   HDL 41 04/13/2018 0957   CHOLHDL 4.5 (H) 04/13/2018 0957   CHOLHDL 4.6 07/02/2016 0823   VLDL 25 07/02/2016 0823   LDLCALC 110 (H) 04/13/2018 0957    Additional studies/ records  that were reviewed today include:   Echocardiogram: 02/09/2017 Study Conclusions  - Left ventricle: The cavity size was mildly dilated. Wall   thickness was increased in a pattern of mild LVH. Systolic   function was severely reduced. The estimated ejection fraction   was in the range of 20% to 25%. Diffuse hypokinesis. Doppler   parameters are consistent with abnormal left ventricular   relaxation (grade 1 diastolic dysfunction). - Aortic valve: Valve area (Vmax): 1.4 cm^2. - Aortic root: The aortic root was mildly dilated. - Ascending aorta: The ascending aorta was mildly dilated. -  Mitral valve: Calcified annulus. There was moderate   regurgitation. - Left atrium: The atrium was moderately dilated. - Right ventricle: Systolic function was moderately reduced. - Pulmonary arteries: Systolic pressure was mildly increased. - Pericardium, extracardiac: A trivial pericardial effusion was   identified.  Impressions:  - No subcostal views; severe global reduction in LV systolic   function; grade 1 diastolic dysfunction; mildly dilated aortic   root and ascending aorta; moderate MR; moderaet LAE; moderately   reduced RV function; mild TR; mildly elevated pulmonary pressure.  Procedures   Right/Left Heart Cath and Coronary Angiography  Conclusion     Ost 1st Mrg to 1st Mrg lesion, 75 %stenosed.  2nd Mrg lesion, 90 %stenosed.  Ost RCA to Mid RCA lesion, 100 %stenosed.  There is severe left ventricular systolic dysfunction.  LV end diastolic pressure is normal.  The left ventricular ejection fraction is 25-35% by visual estimate.  Hemodynamic findings consistent with mild pulmonary hypertension.  LV end diastolic pressure is normal.   1. Severe 2 vessel obstructive CAD    - 75% proximal OM1    - 90% small OM2    - 100% proximal RCA. Left to right collaterals.  2. Severe LV dysfunction 3. Mild pulmonary HTN with normal LV filling pressures.  4. Cardiac index 2.41 L/min/BSA   Plan: Medical management to try and optimize CHF therapy. Patient appears to be adequately diuresed at this time. Based on these results her cardiomyopathy is ischemic. I would treat her CAD medically. If cardiac cath is needed in the future would consider alternative access given difficulty from the right radial approach. Her rhythm during procedure is a multifocal atrial rhythm/tachycardia.     Echo 07/08/17: Study Conclusions  - Left ventricle: The cavity size was normal. There was moderate   concentric hypertrophy. Systolic function was normal. The   estimated ejection  fraction was in the range of 50% to 55%.   Severe hypokinesis of the basal-midinferior myocardium;   consistent with infarction in the distribution of the right   coronary artery. Doppler parameters are consistent with abnormal   left ventricular relaxation (grade 1 diastolic dysfunction). - Mitral valve: Calcified annulus.  Impressions:  - Compared to April 2018 there is marked improvement in contraction   of all LV wall segemnts except for the inferior wall, which   remains severely hypokinetic. There is also marked reduction in   the severity of mitral insufficiency.   Assessment:    1. PAF (paroxysmal atrial fibrillation) (HCC)   2. Chronic systolic CHF (congestive heart failure) (HCC)   3. Peripheral vascular disease (HCC)   4. Essential hypertension   5. Claudication in peripheral vascular disease (HCC)   6. Claudication (HCC)      Plan:   In order of problems listed above:  1. Chronic Combined Systolic and Diastolic CHF/ New Cardiomyopathy - EF 25-30% with ischemic cardiomyopathy. Not a candidate for  revascularization. With medical management EF improved to 50-55% by Echo.  - she appears to be euvolemic at this time. - continue BB, ARB, aldactone, and statin. Will continue current therapy. Not a candidate for Entresto due to cost.    2. Paroxysmal Atrial Fibrillation/ multifocal atrial tachycardia - This patients CHA2DS2-VASc Score and unadjusted Ischemic Stroke Rate (% per year) is equal to 9.7 % stroke rate/year from a score of 6 (CHF, HTN, Female, Age, CVA (2)). She denies any evidence of active bleeding. Continue Coumadin for anticoagulation. Check INR today. Is in NSR today.  3. HTN- controlled.   4. COPD- recent exacerbation. - followed by Pulmonology.   5. PFO - no plans for closure   6. PAD s/p covered stenting of left iliac. Stenosis in distal aorta and right femoral not amenable to percutaneous therapy. Still has right leg claudication but  significant improvement on left and improved quality of life.     I will follow up in 6 months.   Signed, Fran Neiswonger Swaziland, MD  07/21/2018 8:26 AM    [facility] [address] [place], [place] [address] Phone: [phone]

## 2018-07-18 ENCOUNTER — Encounter: Payer: Self-pay | Admitting: Internal Medicine

## 2018-07-18 ENCOUNTER — Ambulatory Visit (INDEPENDENT_AMBULATORY_CARE_PROVIDER_SITE_OTHER): Payer: Medicare Other | Admitting: Internal Medicine

## 2018-07-18 VITALS — BP 122/64 | HR 73 | Ht 64.0 in | Wt 191.6 lb

## 2018-07-18 DIAGNOSIS — R0602 Shortness of breath: Secondary | ICD-10-CM

## 2018-07-18 DIAGNOSIS — R0989 Other specified symptoms and signs involving the circulatory and respiratory systems: Secondary | ICD-10-CM

## 2018-07-18 DIAGNOSIS — J9611 Chronic respiratory failure with hypoxia: Secondary | ICD-10-CM | POA: Diagnosis not present

## 2018-07-18 DIAGNOSIS — J449 Chronic obstructive pulmonary disease, unspecified: Secondary | ICD-10-CM | POA: Diagnosis not present

## 2018-07-18 DIAGNOSIS — R0600 Dyspnea, unspecified: Secondary | ICD-10-CM

## 2018-07-18 DIAGNOSIS — R0689 Other abnormalities of breathing: Principal | ICD-10-CM

## 2018-07-18 MED ORDER — GLYCOPYRROLATE-FORMOTEROL 9-4.8 MCG/ACT IN AERO: 2.0000 | INHALATION_SPRAY | Freq: Two times a day (BID) | RESPIRATORY_TRACT | 0 refills | Status: DC

## 2018-07-18 MED ORDER — PREDNISONE 10 MG PO TABS: ORAL_TABLET | ORAL | 0 refills | Status: DC

## 2018-07-18 MED ORDER — ALBUTEROL SULFATE HFA 108 (90 BASE) MCG/ACT IN AERS: 2.0000 | INHALATION_SPRAY | Freq: Four times a day (QID) | RESPIRATORY_TRACT | 6 refills | Status: DC | PRN

## 2018-07-18 MED ORDER — DOXYCYCLINE HYCLATE 100 MG PO TABS: 100.0000 mg | ORAL_TABLET | Freq: Two times a day (BID) | ORAL | 0 refills | Status: DC

## 2018-07-18 NOTE — Patient Instructions (Addendum)
.  Dyspnea and respiratory abnormalities COPD, severe (HCC) Chronic respiratory failure with hypoxia (HCC) Bibasilar crackles  - Please take prednisone 40 mg x1 day, then 30 mg x1 day, then 20 mg x1 day, then 10 mg x1 day, and then 5 mg x1 day and stop    - Take doxycycline 100mg  po twice daily x 5 days; take after meals and avoid sunlight  -  Start bevespi 2 puff twice daily scheduled  - Use albuterol as needed  - Do HRCT supine/prone for worsening dyspnea and crackles  - respect flu shot deferral (mark as allergy)  Followup - 4 weeks with APP to review CT result and report progerss  - assess need for coreg at followup - 4-6 months with Laurie Horton

## 2018-07-18 NOTE — Addendum Note (Signed)
Addended by: Lorretta Harp on: 07/18/2018 09:24 AM   Modules accepted: Orders

## 2018-07-18 NOTE — Progress Notes (Addendum)
OV 05/19/2016  Chief Complaint  Patient presents with  . Follow-up    Pt c/o worsening SOB, prod cough with thick white mucus.      This is a routine follow-up. Last visit was in April 2017 with nurse practitioner. At that time treated for COPD exacerbation according to her history but review of the chart does not show that to be true. At this point in time she says COPD stable although she says she might be in flare up on account of her fibromyalgia. Starting her symptoms out it appears that it is generally stable with dyspnea at baseline and cough with mild sputum at baseline. She is more hobbled by her chronic pain and fibromyalgia. Couple months ago apparently a hydrocodone was discontinued by rheumatology and therefore she is having "withdrawal". She does have a new pain medication physician. She is on gabapentin for fibromyalgia. There are no other new issues. She does not want antibiotics or prednisone for her perceived exacerbation  OV 11/17/2016  Chief Complaint  Patient presents with  . Follow-up    Pt states her SOB has worsened since last OV. Pt states she has a burning in her chest when she becomes SOB. Pt c/o prod cough with white mucus in morning, cough becomes nonprod throughout the day.     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 Last CT chest March 2017 without any mass and associated mild cor pulmonale   Six-month follow-up visit. She is completely overwhelmed by her fibromyalgia and depression. The fibromyalgia is worse. She says she is change doctors because of this. She's had a few admissions in the interim but none of them give a COPD exacerbation according to chart review. She suffered frustrated by her heels oxygen system even though it is light it is causing her pain. She uses Atrovent nebulizer but wants change to something else but at the same time has rejected use of any other  nebulizer or oral inhaler because of side effects. She is burning chest pain with inspiration and associated with her costochondral junction trigger points. 2014 review of the chart shows normal cardiac stress test. November 2017 chest x-ray is clear. She does not want any further imaging. Chest pain is mild to severe and variable. Worsened with inspiration. No radiation associated wheezing. No sputum production  OV 05/25/2017  Chief Complaint  Patient presents with  . Follow-up    Pt states her SOB has worsened since last OV in 11/2016. Pt states she only coughs after her neb treatment - pt states her mucus is yellow in color and c/o occ chest discomfort. Pt denies f/c/s.     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 Last CT chest March 2017 without any mass and associated mild cor pulmonale   Follow-up exertional hypoxemia associated with his emphysema. Also associated with fibromyalgia. Last visit she had atypical chest pain. Recommended she see cardiology. 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 According to her history she has significant coronary artery disease but is fairly advanced. She is frustrated with this but realistic. She feels her days are number. In terms of COPD emphysema and is stable. She is on Atrovent inhalers. She is on oxygen. She wants a lighter system. She still burden by fibromyalgia. She does not want to do  any vaccines anymore including flu shot and the new  shingles vaccine   OV 11/22/2017  Chief Complaint  Patient presents with  . 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  She uses oxygen and nebulizers.  She is not interested in rehab or vaccines.  New issue: Preoperative pulmonary evaluation.  She is having significant bilateral lower extremity claudication.  She states that she is in severe pain walking from her door to the mailbox to the point she is almost crying.  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  Apparently Dr. Trula Slade wants to try a laparotomy approach.  However Dr. Broadus John might take her to the cardiac Cath Lab and placed stents.  She says she is sensitive to fentanyl and is worried about anesthesia complications but the pain is so severe she is willing to take the risk.  She has had previous cardiac catheterization without any problems other than being sensitive to fentanyl.  She says this can be done in the cardiac Cath Lab with anesthesia support.  She wants me to talk to Dr. Betsy Coder and Dr. Trula Slade about this.   OV 07/18/2018  Subjective:  Patient ID: Laurie Horton, female , DOB: Jun 14, 1946 , age 72 y.o. , MRN: [id number] , ADDRESS: [address] [place] [address]   07/18/2018 -   Chief Complaint  Patient presents with  . Follow-up    Pt states her chest is hurting her all the time now and states she does not think her neb solutions are working for her anymore now. Pt also states she has had some worsening SOB and is also coughing up white phlegm which is comes out in chunks.     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  She uses oxygen and nebulizers. Last CT chest March 2017 without any mass and associated mild cor pulmonale   HPI BETHZY HAUCK 72 y.o. -after last visit she  saw nurse practitioner in June 2019 fora mild respiratory flare. At the time treated with allergy medications antihistamines.She tells me thathe last saw me January 2019 she had an iliac stent place in the left side and after that her effort tolerance is improved but in the last few months she's noticed a decrease in effort tolerance with worsening dyspnea and also increased cough and increased sputum production in volume and also consistency without change in color This no fever or weight loss. She will not have a flu shot because of prior allergy. Her last CT scan of the chest was in 2017 and she is requesting for another one. Her inhaler as ipratropium which she says she's not happy with. In the past she's uses Spiriva and Symbicort and these have caused blisters and so she is generally where you have inhalers although she wants something other than her current one.    CAT COPD Symptom & Quality of Life Score (GSK trademark) 0 is no burden. 5 is highest burden 07/18/2018   Never Cough -> Cough all the time 3  No phlegm in chest -> Chest is full of phlegm 5  No chest tightness -> Chest feels very tight 4  No dyspnea for 1 flight stairs/hill -> Very dyspneic for 1 flight of stairs 5  No limitations for ADL at home -> Very limited with ADL at home 5  Confident leaving home -> Not at all confident leaving home 0  Sleep soundly -> Do not sleep soundly because of lung condition 3  Lots of Energy -> No energy at all 5  TOTAL Score (max 40)  30      ROS - per HPI     has a past medical history of CHF (congestive heart failure) ([place]) (58/52/7782), Complication of anesthesia, COPD (chronic obstructive pulmonary disease) ([facility]), Depression, Diverticulitis, DVT (deep venous thrombosis) ([facility]), Fibromyalgia, GERD (gastroesophageal reflux disease), Hiatal hernia, History of deviated nasal septum, HTN (hypertension), Hyperlipidemia, Obesity, On supplemental oxygen therapy, Osteoporosis, PAT (paroxysmal  atrial tachycardia) ([place]), PFO (patent foramen ovale), PULMONARY NODULE, LEFT LOWER LOBE (10/14/2009), PVD (peripheral vascular disease) with claudication ([place]) (12/2017), Right middle lobe pneumonia ([place]) (07/24/2011), Stroke ([place]), and TOBACCO ABUSE (06/04/2009).   reports that she quit smoking about 8 years ago. Her smoking use included cigarettes. She has a 90.00 pack-year smoking history. She has never used smokeless tobacco.  Past Surgical History:  Procedure Laterality Date  . CARDIOVASCULAR STRESS TEST  12/26/2004   EF 74%. NO EVIDENCE OF ISCHEMIA  . ESOPHAGOGASTRODUODENOSCOPY (EGD) WITH PROPOFOL N/A 04/15/2015   Procedure: ESOPHAGOGASTRODUODENOSCOPY (EGD) WITH PROPOFOL;  Surgeon: Laurence Spates, MD;  Location: [facility];  Service: Endoscopy;  Laterality: N/A;  . KNEE ARTHROSCOPY  2000   left  . LAPAROSCOPIC CHOLECYSTECTOMY  04-16-2010   [facility]  . LOWER EXTREMITY ANGIOGRAPHY N/A 09/09/2017   Procedure: Lower Extremity Angiography;  Surgeon: Lorretta Harp, MD;  Location: [facility];  Service: Cardiovascular;  Laterality: N/A;  . LOWER EXTREMITY INTERVENTION Left 01/17/2018   Procedure: LOWER EXTREMITY INTERVENTION;  Surgeon: Lorretta Harp, MD;  Location: [facility];  Service: Cardiovascular;  Laterality: Left;  . MOUTH SURGERY     03-26-15 multiple extractions stitches remains  . PERIPHERAL VASCULAR INTERVENTION Left 01/17/2018   Procedure: PERIPHERAL VASCULAR INTERVENTION;  Surgeon: Lorretta Harp, MD;  Location: [facility];  Service: Cardiovascular;  Laterality: Left;  COMMON ILIAC  . RIGHT/LEFT HEART CATH AND CORONARY ANGIOGRAPHY N/A 03/04/2017   Procedure: Right/Left Heart Cath and Coronary Angiography;  Surgeon: Peter M Martinique, MD;  Location: [facility];  Service: Cardiovascular;  Laterality: N/A;  . TOTAL ABDOMINAL HYSTERECTOMY     post op needed oxygen was told "she gave them a scare"  . TUBAL LIGATION    . US ECHOCARDIOGRAPHY  11/20/2009    EF 55-60%    Allergies  Allergen Reactions  . Alprazolam Anaphylaxis and Other (See Comments)    REACTION: stops breathing  . Bee Venom Anaphylaxis  . Iodine Anaphylaxis, Swelling and Other (See Comments)    REACTION: swelling in throat  . Pseudoephedrine Hcl Er Shortness Of Breath  . Budesonide-Formoterol Fumarate Other (See Comments)    Blisters inside of mouth all over  . Crestor [Rosuvastatin Calcium] Other (See Comments)    Unable to walk  . Esomeprazole Magnesium Other (See Comments)    REACTION: "bouncing off walls"  . Flonase [Fluticasone Propionate] Other (See Comments)    NOSE BLEED  . Loratadine Other (See Comments)    claritin D causes shaking  . Lotrimin [Clotrimazole] Other (  See Comments)    Mouth blisters  . Lunesta [Eszopiclone] Other (See Comments)    REACTION: "slept for a week"  . Oxcarbazepine Other (See Comments)    Causes deep sleep and dizziness  . Statins Other (See Comments)    Can't walk, legs won't work   . Zolpidem Tartrate Other (See Comments)    REACTION: "slept for a week"  . Betadine [Povidone Iodine] Other (See Comments)    Breathing problems  . Clarithromycin Other (See Comments)    All "mycins", Puts into "a" fib, Will take if has to for severe sinus infection  . Effexor [Venlafaxine] Nausea And Vomiting and Other (See Comments)    cramps  . Lexapro [Escitalopram Oxalate] Other (See Comments)    hallucinations  . Aciphex [Rabeprazole Sodium] Rash  . Alendronate Sodium Other (See Comments)    "caused stomach problems for 3 days"  . Avelox [Moxifloxacin Hcl In Nacl] Other (See Comments)    Stomach cramps.   Darlin Coco [Valdecoxib] Rash  . Ceclor [Cefaclor] Rash  . Cephalexin Rash and Other (See Comments)    Pt states that she is possibly allergic to this - had a reaction to Cefaclor in the past and she does not want to these class drugs. Added per patient request.  . Covera-Hs [Verapamil Hcl] Palpitations  . Dicyclomine Hcl Rash  .  Other Other (See Comments)    Glue from ekg/heart monitor leads --rash, Any MYCINS  . Tessalon Perles Rash    Immunization History  Administered Date(s) Administered  . Influenza Split 08/03/2011, 08/01/2012  . Influenza,inj,Quad PF,6+ Mos 08/14/2013, 09/03/2014, 09/04/2015, 08/18/2016  . Pneumococcal Conjugate-13 10/01/2014  . Pneumococcal Polysaccharide-23 09/03/1999, 06/02/2006, 08/14/2013  . Td 11/02/1994  . Tdap 08/18/2016    Family History  Problem Relation Age of Onset  . Dementia Mother   . Diabetes Mother   . Alzheimer's disease Mother   . Heart attack Brother 14  . Heart attack Father   . Schizophrenia Sister   . Diabetes Sister   . Tremor Sister      Current Outpatient Medications:  .  carvedilol (COREG) 12.5 MG tablet, TAKE 1 TABLET (12.5 MG TOTAL) BY MOUTH 2 (TWO) TIMES DAILY., Disp: 60 tablet, Rfl: 11 .  clopidogrel (PLAVIX) 75 MG tablet, TAKE 1 TABLET (75 MG TOTAL) BY MOUTH DAILY WITH BREAKFAST., Disp: 30 tablet, Rfl: 1 .  desvenlafaxine (PRISTIQ) 50 MG 24 hr tablet, Take 1 tablet (50 mg total) by mouth daily., Disp: 30 tablet, Rfl: 2 .  diazepam (VALIUM) 2 MG tablet, Take 1 tablet (2 mg total) by mouth daily as needed for anxiety., Disp: 10 tablet, Rfl: 0 .  furosemide (LASIX) 40 MG tablet, TAKE 1 TABLET BY MOUTH EVERY DAY, Disp: 30 tablet, Rfl: 11 .  gabapentin (NEURONTIN) 100 MG capsule, Take 100-200 mg by mouth See admin instructions. 100 mg in am, 200 mg at noon and  200 mg at bedtime, Disp: , Rfl:  .  HYDROcodone-acetaminophen (NORCO/VICODIN) 5-325 MG tablet, Take 1 tablet by mouth 3 (three) times daily. , Disp: , Rfl:  .  ipratropium (ATROVENT) 0.02 % nebulizer solution, USE 1 VIAL BY NEBULIZATION 4 (FOUR) TIMES DAILY. (Patient taking differently: USE 1 VIAL BY NEBULIZATION  3 TIMES DAILY AS NEEDED FOR SHORTNESS OF BREATH), Disp: 75 mL, Rfl: 3 .  losartan (COZAAR) 25 MG tablet, TAKE 1 TABLET BY MOUTH EVERY DAY, Disp: 30 tablet, Rfl: 11 .  montelukast  (SINGULAIR) 10 MG tablet, TAKE 1 TABLET BY MOUTH  EVERYDAY AT BEDTIME, Disp: 90 tablet, Rfl: 3 .  ondansetron (ZOFRAN ODT) 8 MG disintegrating tablet, Take 1 tablet (8 mg total) by mouth every 8 (eight) hours as needed for nausea or vomiting., Disp: 20 tablet, Rfl: 0 .  oxybutynin (DITROPAN-XL) 10 MG 24 hr tablet, TAKE 10 MG BY MOUTH EVERY DAY, Disp: 90 tablet, Rfl: 3 .  OXYGEN, Inhale 1.5-2 L into the lungs as needed (for shortness of breath)., Disp: , Rfl:  .  pantoprazole (PROTONIX) 40 MG tablet, Take 40 mg by mouth daily. , Disp: , Rfl:  .  polyethylene glycol (MIRALAX / GLYCOLAX) packet, Take 17 g by mouth every other day. , Disp: , Rfl:  .  rosuvastatin (CRESTOR) 5 MG tablet, Take 1 tablet (5 mg total) by mouth 2 (two) times a week. Sunday and Wednesday, Disp: 24 tablet, Rfl: 3 .  spironolactone (ALDACTONE) 25 MG tablet, TAKE 1 TABLET BY MOUTH EVERY DAY, Disp: 30 tablet, Rfl: 8 .  tiZANidine (ZANAFLEX) 2 MG tablet, Take 2 mg by mouth at bedtime. , Disp: , Rfl:  .  traZODone (DESYREL) 100 MG tablet, Take 1 tablet (100 mg total) by mouth at bedtime., Disp: 30 tablet, Rfl: 2 .  warfarin (COUMADIN) 5 MG tablet, TAKE 1/2 TO 1 TABLET BY MOUTH EVERY DAY OR AS DIRECTED BY COUMADIN CLINIC 11/13/17, Disp: 90 tablet, Rfl: 0 .  diclofenac sodium (VOLTAREN) 1 % GEL, APPLY 2 GRAMS TO BOTH HANDS 4 TIMES A DAY, Disp: , Rfl: 5      Objective:   There were no vitals filed for this visit.  Estimated body mass index is 33.18 kg/m as calculated from the following:   Height as of 04/13/18: 5' 3.78" (1.62 m).   Weight as of 05/03/18: 192 lb (87.1 kg).  @WEIGHTCHANGE @  There were no vitals filed for this visit.   Physical Exam  General Appearance:    Alert, cooperative, no distress, appears stated age - yes , sitting on - chair  Head:    Normocephalic, without obvious abnormality, atraumatic  Eyes:    PERRL, conjunctiva/corneas clear,  Ears:    Normal TM's and external ear canals, both ears  Nose:   Nares  normal, septum midline, mucosa normal, no drainage    or sinus tenderness. OXYGEN ON  - yes . Patient is @ 2-3L [place]   Throat:   Lips, mucosa, and tongue normal; teeth and gums normal. Cyanosis on lips - no  Neck:   Supple, symmetrical, trachea midline, no adenopathy;    thyroid:  no enlargement/tenderness/nodules; no carotid   bruit or JVD  Back:     Symmetric, no curvature, ROM normal, no CVA tenderness  Lungs:     Distress - no , Wheeze no, Barrell Chest - yes, Purse lip breathing - mild yes, Crackles - yes at lung base   Chest Wall:    No tenderness or deformity.    Heart:    Regular rate and rhythm, S1 and S2 normal, no murmur, rub   or gallop  Breast Exam:    NOT DONE  Abdomen:     Soft, non-tender, bowel sounds active all four quadrants,    no masses, no organomegaly  Genitalia:   NOT DONE  Rectal:   NOT DONE  Extremities:   Extremities normal, atraumatic, Clubbing - no, Edema - no  Pulses:   2+ and symmetric all extremities  Skin:   Stigmata of Connective Tissue Disease - no  Lymph nodes:   Cervical,  supraclavicular, and axillary nodes normal  Psychiatric:  Neurologic:   Mild anxious CNII-XII intact, normal strength, sensation  throughout           Assessment:       ICD-10-CM   1. Dyspnea and respiratory abnormalities R06.00    R06.89   2. COPD, severe ([place]) J44.9   3. Chronic respiratory failure with hypoxia ([facility]) J96.11   4. Bibasilar crackles R09.89        Plan:     Dyspnea and respiratory abnormalities COPD, severe ([facility]) Chronic respiratory failure with hypoxia ([facility]) Bibasilar crackles  - Please take prednisone 40 mg x1 day, then 30 mg x1 day, then 20 mg x1 day, then 10 mg x1 day, and then 5 mg x1 day and stop    - Take doxycycline 100mg  po twice daily x 5 days; take after meals and avoid sunlight  -  Start bevespi 2 puff twice daily scheduled  - Use albuterol as needed  - Do HRCT supine/prone for worsening dyspnea and crackles  - respect flu shot  deferral (mark as allergy)  Followup - 4 weeks with APP to review CT result and report progerss  - review need for carvedilol at follow-up - 4-6 months with Gaye Alken    Dr. Brand Males, M.D., F.C.C.P,  Pulmonary and Critical Care Medicine Staff Physician, [facility] Director - Interstitial Lung Disease  Program  Pulmonary [facility] [place], [place], [address]  Pager: [phone], If no answer or between  15:00h - 7:00h: call [phone] Telephone: [phone]  9:02 AM 07/18/2018

## 2018-07-18 NOTE — Addendum Note (Signed)
Addended by: Lorretta Harp on: 07/18/2018 10:07 AM   Modules accepted: Orders

## 2018-07-21 ENCOUNTER — Encounter: Payer: Self-pay | Admitting: Cardiology

## 2018-07-21 ENCOUNTER — Ambulatory Visit (INDEPENDENT_AMBULATORY_CARE_PROVIDER_SITE_OTHER): Payer: Medicare Other | Admitting: Cardiology

## 2018-07-21 ENCOUNTER — Ambulatory Visit (INDEPENDENT_AMBULATORY_CARE_PROVIDER_SITE_OTHER): Payer: Medicare Other | Admitting: Pharmacist

## 2018-07-21 ENCOUNTER — Telehealth: Payer: Self-pay | Admitting: Internal Medicine

## 2018-07-21 VITALS — BP 116/68 | HR 68 | Ht 64.0 in | Wt 193.4 lb

## 2018-07-21 DIAGNOSIS — B370 Candidal stomatitis: Secondary | ICD-10-CM

## 2018-07-21 DIAGNOSIS — I10 Essential (primary) hypertension: Secondary | ICD-10-CM | POA: Diagnosis not present

## 2018-07-21 DIAGNOSIS — I480 Paroxysmal atrial fibrillation: Secondary | ICD-10-CM

## 2018-07-21 DIAGNOSIS — I739 Peripheral vascular disease, unspecified: Secondary | ICD-10-CM | POA: Diagnosis not present

## 2018-07-21 DIAGNOSIS — Z7901 Long term (current) use of anticoagulants: Secondary | ICD-10-CM | POA: Diagnosis not present

## 2018-07-21 DIAGNOSIS — I5022 Chronic systolic (congestive) heart failure: Secondary | ICD-10-CM

## 2018-07-21 LAB — POCT INR: INR: 2.5 (ref 2.0–3.0)

## 2018-07-21 MED ORDER — NYSTATIN 100000 UNIT/ML MT SUSP: 5.0000 mL | Freq: Four times a day (QID) | OROMUCOSAL | 0 refills | Status: DC

## 2018-07-21 NOTE — Patient Instructions (Signed)
Continue your current therapy  Will await results of your CT.

## 2018-07-21 NOTE — Telephone Encounter (Signed)
Spoke with patient, patient reports she was told to call with an update. Patient reports her throat is raw from using the throat spray , which she was unable to name, she has been using salt water rinse to help with the throat irritation. The meds (prednisone and doxycycline) are making her nauseated. She took a zofran this AM and it helped. Denies having fever. Chest still remains tight. Seen my cardiologist today and was told to let us know of her symptoms. Her phlemg is thick and makes her choke.  MR please advise.

## 2018-07-21 NOTE — Telephone Encounter (Signed)
If she can handle the nausea - she should continue doxycycline. She should drink water and have the doxy after meals. IF nausea bad then we can change to another abx but with so many allergies below I think is best she works with doxy  I do not think nausea is from pred  She can try mucinex to help with chest congestion  Raw  throast -she can try # Oral thrush - For Oral thrush: Take Suspension (swish and swallow): 500,000 units 4 times/day for 5 days; swish in the mouth and retain for as long as possible (several minutes) before swallowing. IF nystatin is in back order: alternatives are -  clotrimazole troche (throt lozenge) 10mg  dissolved and swish and swallow 5 times a day for 14 days. Or B   If above do not work -> go to ER     SIGNATURE    Dr. Brand Males, M.D., F.C.C.P,  Pulmonary and Critical Care Medicine Staff Physician, [facility] Director - Interstitial Lung Disease  Program  Pulmonary [facility] at [place], [place], [address]  Pager: [phone], If no answer or between  15:00h - 7:00h: call [phone] Telephone: [phone]  11:26 AM 07/21/2018     Allergies  Allergen Reactions  . Alprazolam Anaphylaxis and Other (See Comments)    REACTION: stops breathing  . Bee Venom Anaphylaxis  . Iodine Anaphylaxis, Swelling and Other (See Comments)    REACTION: swelling in throat  . Pseudoephedrine Hcl Er Shortness Of Breath  . Budesonide-Formoterol Fumarate Other (See Comments)    Blisters inside of mouth all over  . Crestor [Rosuvastatin Calcium] Other (See Comments)    Unable to walk  . Esomeprazole Magnesium Other (See Comments)    REACTION: "bouncing off walls"  . Flonase [Fluticasone Propionate] Other (See Comments)    NOSE BLEED  . Loratadine Other (See Comments)    claritin D causes shaking  . Lotrimin [Clotrimazole] Other (See Comments)    Mouth blisters  . Lunesta [Eszopiclone] Other  (See Comments)    REACTION: "slept for a week"  . Oxcarbazepine Other (See Comments)    Causes deep sleep and dizziness  . Statins Other (See Comments)    Can't walk, legs won't work   . Zolpidem Tartrate Other (See Comments)    REACTION: "slept for a week"  . Betadine [Povidone Iodine] Other (See Comments)    Breathing problems  . Clarithromycin Other (See Comments)    All "mycins", Puts into "a" fib, Will take if has to for severe sinus infection  . Effexor [Venlafaxine] Nausea And Vomiting and Other (See Comments)    cramps  . Lexapro [Escitalopram Oxalate] Other (See Comments)    hallucinations  . Aciphex [Rabeprazole Sodium] Rash  . Alendronate Sodium Other (See Comments)    "caused stomach problems for 3 days"  . Avelox [Moxifloxacin Hcl In Nacl] Other (See Comments)    Stomach cramps.   Darlin Coco [Valdecoxib] Rash  . Ceclor [Cefaclor] Rash  . Cephalexin Rash and Other (See Comments)    Pt states that she is possibly allergic to this - had a reaction to Cefaclor in the past and she does not want to these class drugs. Added per patient request.  . Covera-Hs [Verapamil Hcl] Palpitations  . Dicyclomine Hcl Rash  . Other Other (See Comments)    Glue from ekg/heart monitor leads --rash, Any MYCINS  . Tessalon Perles Rash

## 2018-07-21 NOTE — Telephone Encounter (Signed)
Spoke with patient, made aware of recommendations per MR, voiced understanding. Order for nystatin placed. Nothing further needed at this time.

## 2018-07-26 ENCOUNTER — Encounter (HOSPITAL_COMMUNITY): Payer: Self-pay | Admitting: Psychiatry

## 2018-07-26 ENCOUNTER — Ambulatory Visit (INDEPENDENT_AMBULATORY_CARE_PROVIDER_SITE_OTHER): Payer: Medicare Other | Admitting: Psychiatry

## 2018-07-26 DIAGNOSIS — Z810 Family history of intellectual disabilities: Secondary | ICD-10-CM

## 2018-07-26 DIAGNOSIS — Z818 Family history of other mental and behavioral disorders: Secondary | ICD-10-CM

## 2018-07-26 DIAGNOSIS — F411 Generalized anxiety disorder: Secondary | ICD-10-CM | POA: Diagnosis not present

## 2018-07-26 DIAGNOSIS — F331 Major depressive disorder, recurrent, moderate: Secondary | ICD-10-CM | POA: Diagnosis not present

## 2018-07-26 DIAGNOSIS — Z87891 Personal history of nicotine dependence: Secondary | ICD-10-CM

## 2018-07-26 MED ORDER — DESVENLAFAXINE SUCCINATE ER 50 MG PO TB24: 50.0000 mg | ORAL_TABLET | Freq: Every day | ORAL | 2 refills | Status: DC

## 2018-07-26 MED ORDER — DIAZEPAM 2 MG PO TABS: 2.0000 mg | ORAL_TABLET | Freq: Every day | ORAL | 0 refills | Status: DC | PRN

## 2018-07-26 NOTE — Progress Notes (Signed)
BH MD/PA/NP OP Progress Note  07/26/2018 3:44 PM Laurie Horton  MRN:  [id number]  Chief Complaint: Patient returns for medication management appointment HPI: I am covering for Dr. Adele Schilder, who is currently out of office.  Patient will follow with Dr. Adele Schilder. 72 year old female, divorced, 3 adult children, lives alone. Has been diagnosed with major depression and generalized anxiety disorder. Returns for medication management. Reports she has been generally doing "okay" but describes some increased anxiety in the context of significant psychosocial stressors.  States her sister-in-law has an advanced malignancy/pet dog  chronically ill.  Describes chronic medical illnesses to include COPD and peripheral artery disease.  Of note states that she stopped smoking in 2006. Denies suicidal ideations. Denies medication side effects.   Visit Diagnosis:    ICD-10-CM   1. MDD (major depressive disorder), recurrent episode, moderate (HCC) F33.1 desvenlafaxine (PRISTIQ) 50 MG 24 hr tablet  2. Generalized anxiety disorder F41.1 diazepam (VALIUM) 2 MG tablet    Past Psychiatric History:   Past Medical History:  Past Medical History:  Diagnosis Date  . CHF (congestive heart failure) ([place]) 03/02/2017   EF 25-30% 2018  . Complication of anesthesia    various issues with oxygen  saturations post op  . COPD (chronic obstructive pulmonary disease) ([place])   . Depression   . Diverticulitis   . DVT (deep venous thrombosis) ([place])   . Fibromyalgia   . GERD (gastroesophageal reflux disease)   . Hiatal hernia   . History of deviated nasal septum    left- side  . HTN (hypertension)   . Hyperlipidemia   . Obesity   . On supplemental oxygen therapy    concentrator at night @ 1.5 l/m or when sleeps. O2 Sat niormally 87.  . Osteoporosis   . PAT (paroxysmal atrial tachycardia) ([place])   . PFO (patent foramen ovale)   . PULMONARY NODULE, LEFT LOWER LOBE 10/14/2009   61mm LLL nodule dec 2010. Stable and 11mm  in Oct 2012. No further fu  . PVD (peripheral vascular disease) with claudication ([place]) 12/2017  . Right middle lobe pneumonia ([place]) 07/24/2011   First noted at admit 07/10/11. Persists on cxr 07/22/11. Cleared on CT 08/24/11. No further followup  . Stroke ([place])   . TOBACCO ABUSE 06/04/2009    Past Surgical History:  Procedure Laterality Date  . CARDIOVASCULAR STRESS TEST  12/26/2004   EF 74%. NO EVIDENCE OF ISCHEMIA  . ESOPHAGOGASTRODUODENOSCOPY (EGD) WITH PROPOFOL N/A 04/15/2015   Procedure: ESOPHAGOGASTRODUODENOSCOPY (EGD) WITH PROPOFOL;  Surgeon: Laurence Spates, MD;  Location: [facility];  Service: Endoscopy;  Laterality: N/A;  . KNEE ARTHROSCOPY  2000   left  . LAPAROSCOPIC CHOLECYSTECTOMY  04-16-2010   [place]  . LOWER EXTREMITY ANGIOGRAPHY N/A 09/09/2017   Procedure: Lower Extremity Angiography;  Surgeon: Lorretta Harp, MD;  Location: [facility];  Service: Cardiovascular;  Laterality: N/A;  . LOWER EXTREMITY INTERVENTION Left 01/17/2018   Procedure: LOWER EXTREMITY INTERVENTION;  Surgeon: Lorretta Harp, MD;  Location: [facility];  Service: Cardiovascular;  Laterality: Left;  . MOUTH SURGERY     03-26-15 multiple extractions stitches remains  . PERIPHERAL VASCULAR INTERVENTION Left 01/17/2018   Procedure: PERIPHERAL VASCULAR INTERVENTION;  Surgeon: Lorretta Harp, MD;  Location: [facility];  Service: Cardiovascular;  Laterality: Left;  COMMON ILIAC  . RIGHT/LEFT HEART CATH AND CORONARY ANGIOGRAPHY N/A 03/04/2017   Procedure: Right/Left Heart Cath and Coronary Angiography;  Surgeon: Peter M Martinique, MD;  Location: [facility]  INVASIVE CV LAB;  Service: Cardiovascular;  Laterality: N/A;  . TOTAL ABDOMINAL HYSTERECTOMY     post op needed oxygen was told "she gave them a scare"  . TUBAL LIGATION    . US ECHOCARDIOGRAPHY  11/20/2009   EF 55-60%    Family Psychiatric History:   Family History:  Family History  Problem Relation Age of Onset  . Dementia Mother   .  Diabetes Mother   . Alzheimer's disease Mother   . Heart attack Brother 2  . Heart attack Father   . Schizophrenia Sister   . Diabetes Sister   . Tremor Sister     Social History:  Social History   Socioeconomic History  . Marital status: Divorced    Spouse name: Not on file  . Number of children: 2  . Years of education: Not on file  . Highest education level: Not on file  Occupational History  . Occupation: disability  Social Needs  . Financial resource strain: Not on file  . Food insecurity:    Worry: Not on file    Inability: Not on file  . Transportation needs:    Medical: Not on file    Non-medical: Not on file  Tobacco Use  . Smoking status: Former Smoker    Packs/day: 3.00    Years: 30.00    Pack years: 90.00    Types: Cigarettes    Last attempt to quit: 06/02/2010    Years since quitting: 8.1  . Smokeless tobacco: Never Used  . Tobacco comment: QUIT IN 2011  Substance and Sexual Activity  . Alcohol use: No    Alcohol/week: 0.0 standard drinks  . Drug use: No  . Sexual activity: Yes    Partners: Male    Birth control/protection: Post-menopausal, Surgical  Lifestyle  . Physical activity:    Days per week: Not on file    Minutes per session: Not on file  . Stress: Not on file  Relationships  . Social connections:    Talks on phone: Not on file    Gets together: Not on file    Attends religious service: Not on file    Active member of club or organization: Not on file    Attends meetings of clubs or organizations: Not on file    Relationship status: Not on file  Other Topics Concern  . Not on file  Social History Narrative   Marital status: divorced in 1978 after ten years; dating casually in 2019.      Children: 3 biological children; 3 court appointed children; 6 grandchildren; 5 gg      Lives: alone; children in [place] and [place].      Employment: retired age 60; disability for COPD, CVA at age 44.      Tobacco: former smoker; quit smoking  2011.      Alcohol: none      Exercise: walks dog four times per day; goes to pool three times per week.      ADLs: drives; no assistant devices; does have a walker.  Cleaning is limited in 2018.  Daughter helps with cleaning.  Does own grocery shopping.      Advanced Directives: YES; DNR/DNI; HCPOA: Rolan Lipa Martin/daughter youngest.  Blind.                Allergies:  Allergies  Allergen Reactions  . Alprazolam Anaphylaxis and Other (See Comments)    REACTION: stops breathing  . Bee Venom Anaphylaxis  . Iodine Anaphylaxis,  Swelling and Other (See Comments)    REACTION: swelling in throat  . Pseudoephedrine Hcl Er Shortness Of Breath  . Budesonide-Formoterol Fumarate Other (See Comments)    Blisters inside of mouth all over  . Crestor [Rosuvastatin Calcium] Other (See Comments)    Unable to walk  . Esomeprazole Magnesium Other (See Comments)    REACTION: "bouncing off walls"  . Flonase [Fluticasone Propionate] Other (See Comments)    NOSE BLEED  . Loratadine Other (See Comments)    claritin D causes shaking  . Lotrimin [Clotrimazole] Other (See Comments)    Mouth blisters  . Lunesta [Eszopiclone] Other (See Comments)    REACTION: "slept for a week"  . Oxcarbazepine Other (See Comments)    Causes deep sleep and dizziness  . Statins Other (See Comments)    Can't walk, legs won't work   . Zolpidem Tartrate Other (See Comments)    REACTION: "slept for a week"  . Betadine [Povidone Iodine] Other (See Comments)    Breathing problems  . Clarithromycin Other (See Comments)    All "mycins", Puts into "a" fib, Will take if has to for severe sinus infection  . Effexor [Venlafaxine] Nausea And Vomiting and Other (See Comments)    cramps  . Lexapro [Escitalopram Oxalate] Other (See Comments)    hallucinations  . Aciphex [Rabeprazole Sodium] Rash  . Alendronate Sodium Other (See Comments)    "caused stomach problems for 3 days"  . Avelox [Moxifloxacin Hcl In Nacl] Other  (See Comments)    Stomach cramps.   Darlin Coco [Valdecoxib] Rash  . Ceclor [Cefaclor] Rash  . Cephalexin Rash and Other (See Comments)    Pt states that she is possibly allergic to this - had a reaction to Cefaclor in the past and she does not want to these class drugs. Added per patient request.  . Covera-Hs [Verapamil Hcl] Palpitations  . Dicyclomine Hcl Rash  . Other Other (See Comments)    Glue from ekg/heart monitor leads --rash, Any MYCINS  . Tessalon Perles Rash    Metabolic Disorder Labs: Lab Results  Component Value Date   HGBA1C 6.3 (H) 04/13/2018   MPG 120 (H) 05/04/2013   No results found for: PROLACTIN Lab Results  Component Value Date   CHOL 183 04/13/2018   TRIG 158 (H) 04/13/2018   HDL 41 04/13/2018   CHOLHDL 4.5 (H) 04/13/2018   VLDL 25 07/02/2016   LDLCALC 110 (H) 04/13/2018   LDLCALC 99 10/20/2017   Lab Results  Component Value Date   TSH 4.560 (H) 01/11/2018   TSH 1.990 08/31/2017    Therapeutic Level Labs: No results found for: LITHIUM No results found for: VALPROATE No components found for:  CBMZ  Current Medications: Current Outpatient Medications  Medication Sig Dispense Refill  . carvedilol (COREG) 12.5 MG tablet TAKE 1 TABLET (12.5 MG TOTAL) BY MOUTH 2 (TWO) TIMES DAILY. 60 tablet 11  . clopidogrel (PLAVIX) 75 MG tablet TAKE 1 TABLET (75 MG TOTAL) BY MOUTH DAILY WITH BREAKFAST. 30 tablet 1  . desvenlafaxine (PRISTIQ) 50 MG 24 hr tablet Take 1 tablet (50 mg total) by mouth daily. 30 tablet 2  . diazepam (VALIUM) 2 MG tablet Take 1 tablet (2 mg total) by mouth daily as needed for anxiety. 10 tablet 0  . diclofenac sodium (VOLTAREN) 1 % GEL APPLY 2 GRAMS TO BOTH HANDS 4 TIMES A DAY  5  . Eyelid Cleansers (OCUSOFT BABY EYELID & EYELASH EX) Apply topically.    . furosemide (  LASIX) 40 MG tablet TAKE 1 TABLET BY MOUTH EVERY DAY 30 tablet 11  . gabapentin (NEURONTIN) 100 MG capsule Take 100-200 mg by mouth See admin instructions. 100 mg in am, 200 mg  at noon and  200 mg at bedtime    . Glycopyrrolate-Formoterol (BEVESPI AEROSPHERE) 9-4.8 MCG/ACT AERO Inhale 2 puffs into the lungs 2 (two) times daily. 1 Inhaler 0  . HYDROcodone-acetaminophen (NORCO/VICODIN) 5-325 MG tablet Take 1 tablet by mouth 3 (three) times daily.     Marland Kitchen ipratropium (ATROVENT) 0.02 % nebulizer solution USE 1 VIAL BY NEBULIZATION 4 (FOUR) TIMES DAILY. (Patient taking differently: USE 1 VIAL BY NEBULIZATION  3 TIMES DAILY AS NEEDED FOR SHORTNESS OF BREATH) 75 mL 3  . losartan (COZAAR) 25 MG tablet TAKE 1 TABLET BY MOUTH EVERY DAY 30 tablet 11  . montelukast (SINGULAIR) 10 MG tablet TAKE 1 TABLET BY MOUTH EVERYDAY AT BEDTIME 90 tablet 3  . nystatin (MYCOSTATIN) 100000 UNIT/ML suspension Take 5 mLs (500,000 Units total) by mouth 4 (four) times daily. Swish and swallow 4 times daily 60 mL 0  . ondansetron (ZOFRAN ODT) 8 MG disintegrating tablet Take 1 tablet (8 mg total) by mouth every 8 (eight) hours as needed for nausea or vomiting. 20 tablet 0  . oxybutynin (DITROPAN-XL) 10 MG 24 hr tablet TAKE 10 MG BY MOUTH EVERY DAY 90 tablet 3  . OXYGEN Inhale 1.5-2 L into the lungs as needed (for shortness of breath).    . pantoprazole (PROTONIX) 40 MG tablet Take 40 mg by mouth daily.     . polyethylene glycol (MIRALAX / GLYCOLAX) packet Take 17 g by mouth every other day.     . rosuvastatin (CRESTOR) 5 MG tablet Take 1 tablet (5 mg total) by mouth 2 (two) times a week. Sunday and Wednesday 24 tablet 3  . spironolactone (ALDACTONE) 25 MG tablet TAKE 1 TABLET BY MOUTH EVERY DAY 30 tablet 8  . tiZANidine (ZANAFLEX) 2 MG tablet Take 2 mg by mouth at bedtime.     . traZODone (DESYREL) 100 MG tablet Take 1 tablet (100 mg total) by mouth at bedtime. 30 tablet 2  . warfarin (COUMADIN) 5 MG tablet TAKE 1/2 TO 1 TABLET BY MOUTH EVERY DAY OR AS DIRECTED BY COUMADIN CLINIC 11/13/17 90 tablet 0   No current facility-administered medications for this visit.      Musculoskeletal: Strength & Muscle  Tone: within normal limits Gait & Station: normal Patient leans: N/A  Psychiatric Specialty Exam: ROS no chest pain, no current dyspnea at room air  Blood pressure 135/86, pulse 67, height 5\' 4"  (1.626 m), weight 87.5 kg.Body mass index is 33.09 kg/m.  General Appearance: Well Groomed  Eye Contact:  Good  Speech:  Normal Rate  Volume:  Normal  Mood:  Reports some persistent depression, which she attributes to above chronic stressors  Affect:  Reactive, smiles at times appropriately  Thought Process:  Linear and Descriptions of Associations: Intact  Orientation:  Full (Time, Place, and Person)  Thought Content: No hallucinations, no delusions   Suicidal Thoughts:  No-denies suicidal ideations, no self-injurious ideations  Homicidal Thoughts:  No  Memory:  Recent and remote grossly intact  Judgement:  Other:  Present  Insight:  Present  Psychomotor Activity:  Normal  Concentration:  Concentration: Good and Attention Span: Good  Recall:  Good  Fund of Knowledge: Good  Language: Good  Akathisia:  Negative  Handed:  Right  AIMS (if indicated): not done today-does not endorse  any abnormal involuntary movements  Assets:  Desire for Improvement Resilience  ADL's:  Intact  Cognition: WNL  Sleep:  Fair   Screenings: PHQ2-9     Office Visit from 04/13/2018 in Primary Care at [facility] from 10/20/2017 in Primary Care at [place] from 04/28/2017 in Primary Care at [place] from 02/08/2017 in [place] at [facility] Erroneous Encounter from 12/01/2016 in Primary Care at [facility] Total Score  2  0  0  0  0  PHQ-9 Total Score  5  -  -  -  -       Assessment and Plan: 72 year old female, history of depression and anxiety.  Presents for medication management. Reports some persistent depression/anxiety related to chronic stressors (chronic medical illness, sister-in-law with advanced malignancy).  Currently on Pristiq and Valium which she is tolerating well.   Continue Valium 2 mg nightly PRN for anxiety ( #10, NR)-states she does not take medications daily , uses for episodes of increased anxiety.  We reviewed sedation/ abuse potential. Review Pristiq 50 mg daily.   Of note, reports that she is taking Trazodone at 50 mg nightly. Will return for medication management in 2 months, agrees to contact clinic sooner should to be any worsening or concerns prior   Jenne Campus, MD 07/26/2018, 3:44 PM

## 2018-07-28 ENCOUNTER — Ambulatory Visit (INDEPENDENT_AMBULATORY_CARE_PROVIDER_SITE_OTHER)
Admission: RE | Admit: 2018-07-28 | Discharge: 2018-07-28 | Disposition: A | Discharge status: Home / Self Care | Payer: Medicare Other | Source: Ambulatory Visit | Attending: Internal Medicine | Admitting: Internal Medicine

## 2018-07-28 DIAGNOSIS — R0602 Shortness of breath: Secondary | ICD-10-CM | POA: Diagnosis not present

## 2018-08-01 ENCOUNTER — Telehealth: Payer: Self-pay | Admitting: Internal Medicine

## 2018-08-01 NOTE — Telephone Encounter (Signed)
Called and spoke with patient regarding results.  Informed the patient of results and recommendations today. Pt advised that she is still not breathing well, has productive cough-white foamy, SOB. She completed docy, and feels no better. Has appt with B.Mack on 08-19-18 for f/u. Pt verbalized understanding and denied any questions or concerns at this time.   B.Mack please advise. Thank you.

## 2018-08-01 NOTE — Telephone Encounter (Signed)
See telephone encounter dated 08/01/18.

## 2018-08-01 NOTE — Telephone Encounter (Signed)
Result given in another phone message

## 2018-08-01 NOTE — Telephone Encounter (Signed)
  No cancer No fibrosis No pneumonia Has severe emphysema- we knewo that Has heart issues - we knew that  BAsically nothing new  Sorry for delay  IMPRESSION: 1. No findings to suggest interstitial lung disease. 2. Diffuse bronchial wall thickening with moderate to severe centrilobular and mild paraseptal emphysema. 3. Aortic atherosclerosis, in addition to left main and 3 vessel coronary artery disease. Assessment for potential risk factor modification, dietary therapy or pharmacologic therapy may be warranted, if clinically indicated. 4. There are calcifications of the aortic valve. Echocardiographic correlation for evaluation of potential valvular dysfunction may be warranted if clinically indicated.  Aortic Atherosclerosis (ICD10-I70.0) and Emphysema (ICD10-J43.9).   Electronically Signed   By: Vinnie Langton M.D.   On: 07/28/2018 09:19

## 2018-08-01 NOTE — Telephone Encounter (Signed)
Dr. [name],  Please review CT scan and let us know what to inform pt. Thank you!

## 2018-08-01 NOTE — Telephone Encounter (Signed)
Pt is requesting her CT results from 07/28/18.  MR - please advise. Thanks.

## 2018-08-01 NOTE — Telephone Encounter (Signed)
Called and spoke with patient she advised that she will keep her 08-19-18 appt with B.Mack She advised that she has a staph infection per her dermatologist.  Pt's states that she has productive cough-white foam with SOB and some chest tightness Feels like this almost all the time, feels this may be her baseline symptoms.  Pt denies any other symptoms, Pt denies earlier appt at this time Nothing further needed at this time.

## 2018-08-01 NOTE — Telephone Encounter (Signed)
Patient has severe COPD and emphysema.  Confirmed on recent CT.  Patient having fever? any other symptoms besides a productive cough with white foamy mucus?  The shortness of breath is probably multifactorial as previously discussed with her multiple office visits.  It is unclear based off the data provided if patient would benefit from an antibiotic or prednisone.  I suspect these may be baseline symptoms.  I am sorry she is not feeling well.  She always can schedule a earlier follow-up visit than 08/19/2018 for follow-up/evaluation.  Wyn Quaker, FNP

## 2018-08-08 ENCOUNTER — Other Ambulatory Visit: Payer: Self-pay | Admitting: Cardiovascular Disease

## 2018-08-17 ENCOUNTER — Ambulatory Visit (HOSPITAL_BASED_OUTPATIENT_CLINIC_OR_DEPARTMENT_OTHER)
Admission: RE | Admit: 2018-08-17 | Discharge: 2018-08-17 | Disposition: A | Discharge status: Home / Self Care | Payer: Medicare Other | Source: Ambulatory Visit | Attending: Cardiovascular Disease | Admitting: Cardiovascular Disease

## 2018-08-17 ENCOUNTER — Ambulatory Visit (INDEPENDENT_AMBULATORY_CARE_PROVIDER_SITE_OTHER): Payer: Medicare Other | Admitting: Pharmacist

## 2018-08-17 ENCOUNTER — Ambulatory Visit (HOSPITAL_COMMUNITY)
Admission: RE | Admit: 2018-08-17 | Discharge: 2018-08-17 | Disposition: A | Discharge status: Home / Self Care | Payer: Medicare Other | Source: Ambulatory Visit | Attending: Internal Medicine | Admitting: Internal Medicine

## 2018-08-17 DIAGNOSIS — I739 Peripheral vascular disease, unspecified: Secondary | ICD-10-CM | POA: Insufficient documentation

## 2018-08-17 DIAGNOSIS — I6523 Occlusion and stenosis of bilateral carotid arteries: Secondary | ICD-10-CM | POA: Diagnosis present

## 2018-08-17 DIAGNOSIS — Z7901 Long term (current) use of anticoagulants: Secondary | ICD-10-CM | POA: Diagnosis not present

## 2018-08-17 DIAGNOSIS — Z95820 Peripheral vascular angioplasty status with implants and grafts: Secondary | ICD-10-CM | POA: Insufficient documentation

## 2018-08-17 DIAGNOSIS — I480 Paroxysmal atrial fibrillation: Secondary | ICD-10-CM

## 2018-08-17 LAB — POCT INR: INR: 4.2 — AB (ref 2.0–3.0)

## 2018-08-18 NOTE — Progress Notes (Signed)
'@Patient'  ID: Laurie Horton, female    DOB: 1946/05/20, 72 y.o.   MRN: [id number]  Chief Complaint  Patient presents with  . Follow-up    COPD follow up     Referring provider: Rutherford Guys, MD  HPI:  72 year old female former smoker followed in our office for COPD, LLL pulm nodule   PMH: HTN, CHF, Afib (coumadin), Scoliosis, Anxiety Smoker/ Smoking History: Former Smoker. 90 pack years.  Maintenance:  Bevespi Pt of: Dr. [name]  Recent [place] [facility] Encounters:   04/12/18-Acute- BM 72 year old patient presenting today.  With a myriad of respiratory symptoms.  Shortness of breath and sneezing for 2 weeks.  Patient reporting that she is on 2 L of oxygen at home and 2-1/2 L pulsed.  Patient reporting that she is having thick yellow and white mucus.  Denies hemoptysis.Patient also reporting that she recently was diagnosed with scabies by her dermatologist.  Unfortunately due to patient's allergies patient is unable to take any of the interventions that the dermatologist had suggested her to take.  Plan: Viral URI, nasal saline rinses  08/19/2018  - Visit   [name].  Patient was last seen in our office visit on 07/18/2018 started on Bevespi.  Patient reports that after 1 day of use of the Bevespi inhaler she developed thrush and mouth sores.  She she contacted our office to be treated with nystatin.  Patient reports that most mouth sores resolved there remains one.  Patient is also having extensive dental work done.  Patient also has been treated for scabies as well as impetigo by dermatology recently.   Patient completed a high-res CT in 07/29/2018 that showed no real changes from baseline.  Still showing severe emphysema.   Patient reports that she continues to use her Atrovent nebulizer but is wondering if there is any other options available for her.  Patient feels that the Atrovent nebulizer is not working as well.  Patient is  currently using as needed and not using it scheduled.  MMRC - Breathlessness Score 3 - I stop for breath after walking about 100 yards or after a few minutes on level ground (isle at grocery store is 175f)  Patient reports that she has known triggers of shortness of breath with exertion, when there is high pollen counts, when she is walking, when she is outside for extended periods of time.  Patient reports she is been using 2 L via nasal cannula of oxygen with exertion as well as at rest.  Patient reports she forgot her POC at home.  She arrived to our office on room air.  Oxygen saturations 87.  Patient refused oxygen in our office and states that she does not think she needs oxygen right now.  Patient is sad and concerned regarding her limitations with vascular surgery.  Patient believes that she needs a surgery but reports that vascular team as well as cardiology does not think that she would be a good surgical candidate.  She reports that she has not heard back from Dr. BNaida Sleightoffice regarding her most recent test results.   Tests:  01/21/2016-CT chest without contrast- moderate centrilobular emphysema and diffuse bronchial wall thickening mild subpleural density in the dependent lower lobes  01/20/2016-pulmonary function test- airway obstruction and diffusion defect suggesting emphysema  Imaging:  02/11/2017-chest x-ray-stable large cardiac silhouette, lungs are hyperinflated, interstitial edema pattern unchanged from prior scans   Cardiac:  07/08/2017-echocardiogram-LV ejection  fraction 50 to 60%, grade 1 diastolic dysfunction  Labs:   Micro:    FENO:  No results found for: NITRICOXIDE  PFT: PFT Results Latest Ref Rng & Units 01/20/2016 08/14/2013  FVC-Pre L 2.79 -  FVC-Predicted Pre % 93 100  FVC-Post L - 3.07  FVC-Predicted Post % - 103  Pre FEV1/FVC % % 67 62  Post FEV1/FCV % % - 64  FEV1-Pre L 1.86 1.84  FEV1-Predicted Pre % 82 82  DLCO UNC% % 40 50  DLCO COR  %Predicted % 51 58  TLC L 4.62 4.32  TLC % Predicted % 91 88  RV % Predicted % 78 77    Imaging: Ct Chest High Resolution  Result Date: 07/28/2018 CLINICAL DATA:  72 year old female with history of emphysema. Chronic hypoxemic respiratory failure. Former smoker (quit in 2006). EXAM: CT CHEST WITHOUT CONTRAST TECHNIQUE: Multidetector CT imaging of the chest was performed following the standard protocol without intravenous contrast. High resolution imaging of the lungs, as well as inspiratory and expiratory imaging, was performed. COMPARISON:  Chest CT 01/21/2016. FINDINGS: Cardiovascular: Heart size is borderline enlarged. There is no significant pericardial fluid, thickening or pericardial calcification. There is aortic atherosclerosis, as well as atherosclerosis of the great vessels of the mediastinum and the coronary arteries, including calcified atherosclerotic plaque in the left main, left anterior descending, left circumflex and right coronary arteries. Calcifications of the aortic valve. Mediastinum/Nodes: No pathologically enlarged mediastinal or hilar lymph nodes. Please note that accurate exclusion of hilar adenopathy is limited on noncontrast CT scans. Esophagus is unremarkable in appearance. No axillary lymphadenopathy. Lungs/Pleura: High-resolution images demonstrate no significant regions of ground-glass attenuation, subpleural reticulation, parenchymal banding, traction bronchiectasis or frank honeycombing. Inspiratory and expiratory imaging demonstrates mild air trapping, indicative of small airways disease. Diffuse bronchial wall thickening with moderate to severe centrilobular and mild paraseptal emphysema. Areas of mild scarring are noted in the lower lobes of the lungs bilaterally. No acute consolidative airspace disease. No pleural effusions. Upper Abdomen: Aortic atherosclerosis.  Status post cholecystectomy. Musculoskeletal: There are no aggressive appearing lytic or blastic lesions  noted in the visualized portions of the skeleton. IMPRESSION: 1. No findings to suggest interstitial lung disease. 2. Diffuse bronchial wall thickening with moderate to severe centrilobular and mild paraseptal emphysema. 3. Aortic atherosclerosis, in addition to left main and 3 vessel coronary artery disease. Assessment for potential risk factor modification, dietary therapy or pharmacologic therapy may be warranted, if clinically indicated. 4. There are calcifications of the aortic valve. Echocardiographic correlation for evaluation of potential valvular dysfunction may be warranted if clinically indicated. Aortic Atherosclerosis (ICD10-I70.0) and Emphysema (ICD10-J43.9). Electronically Signed   By: Vinnie Langton M.D.   On: 07/28/2018 09:19    Chart Review:    Specialty Problems      Pulmonary Problems   PULMONARY NODULE, LEFT LOWER LOBE    41m LLL nodule dec 2010. Stable and 448min Oct 2012. No further fu       Chronic respiratory failure with hypoxia (HCC)   Acute on chronic respiratory failure with hypoxia (HCC)   COPD, severe (HCC)   Shortness of breath      Allergies  Allergen Reactions  . Alprazolam Anaphylaxis and Other (See Comments)    REACTION: stops breathing  . Bee Venom Anaphylaxis  . Iodine Anaphylaxis, Swelling and Other (See Comments)    REACTION: swelling in throat  . Pseudoephedrine Hcl Er Shortness Of Breath  . Budesonide-Formoterol Fumarate Other (See Comments)    Blisters  inside of mouth all over  . Crestor [Rosuvastatin Calcium] Other (See Comments)    Unable to walk  . Esomeprazole Magnesium Other (See Comments)    REACTION: "bouncing off walls"  . Flonase [Fluticasone Propionate] Other (See Comments)    NOSE BLEED  . Loratadine Other (See Comments)    claritin D causes shaking  . Lotrimin [Clotrimazole] Other (See Comments)    Mouth blisters  . Lunesta [Eszopiclone] Other (See Comments)    REACTION: "slept for a week"  . Oxcarbazepine Other (See  Comments)    Causes deep sleep and dizziness  . Statins Other (See Comments)    Can't walk, legs won't work   . Zolpidem Tartrate Other (See Comments)    REACTION: "slept for a week"  . Betadine [Povidone Iodine] Other (See Comments)    Breathing problems  . Bevespi Aerosphere [Glycopyrrolate-Formoterol] Other (See Comments)    Pt believes this caused mouth sores and thrush   . Clarithromycin Other (See Comments)    All "mycins", Puts into "a" fib, Will take if has to for severe sinus infection  . Effexor [Venlafaxine] Nausea And Vomiting and Other (See Comments)    cramps  . Lexapro [Escitalopram Oxalate] Other (See Comments)    hallucinations  . Aciphex [Rabeprazole Sodium] Rash  . Alendronate Sodium Other (See Comments)    "caused stomach problems for 3 days"  . Avelox [Moxifloxacin Hcl In Nacl] Other (See Comments)    Stomach cramps.   Darlin Coco [Valdecoxib] Rash  . Ceclor [Cefaclor] Rash  . Cephalexin Rash and Other (See Comments)    Pt states that she is possibly allergic to this - had a reaction to Cefaclor in the past and she does not want to these class drugs. Added per patient request.  . Covera-Hs [Verapamil Hcl] Palpitations  . Dicyclomine Hcl Rash  . Other Other (See Comments)    Glue from ekg/heart monitor leads --rash, Any MYCINS  . Tessalon Perles Rash    Immunization History  Administered Date(s) Administered  . Influenza Split 08/03/2011, 08/01/2012  . Influenza,inj,Quad PF,6+ Mos 08/14/2013, 09/03/2014, 09/04/2015, 08/18/2016  . Pneumococcal Conjugate-13 10/01/2014  . Pneumococcal Polysaccharide-23 09/03/1999, 06/02/2006, 08/14/2013  . Td 11/02/1994  . Tdap 08/18/2016    Past Medical History:  Diagnosis Date  . CHF (congestive heart failure) ([place]) 03/02/2017   EF 25-30% 2018  . Complication of anesthesia    various issues with oxygen  saturations post op  . COPD (chronic obstructive pulmonary disease) ([place])   . Depression   . Diverticulitis   . DVT  (deep venous thrombosis) ([place])   . Fibromyalgia   . GERD (gastroesophageal reflux disease)   . Hiatal hernia   . History of deviated nasal septum    left- side  . HTN (hypertension)   . Hyperlipidemia   . Obesity   . On supplemental oxygen therapy    concentrator at night @ 1.5 l/m or when sleeps. O2 Sat niormally 87.  . Osteoporosis   . PAT (paroxysmal atrial tachycardia) ([place])   . PFO (patent foramen ovale)   . PULMONARY NODULE, LEFT LOWER LOBE 10/14/2009   17m LLL nodule dec 2010. Stable and 446min Oct 2012. No further fu  . PVD (peripheral vascular disease) with claudication ([facility]  . Right middle lobe pneumonia ([facility]/21/2012   First noted at admit 07/10/11. Persists on cxr 07/22/11. Cleared on CT 08/24/11. No further followup  . [place]  . TOBACCO ABUSE 06/04/2009  Tobacco History: Social History   Tobacco Use  Smoking Status Former Smoker  . Packs/day: 3.00  . Years: 30.00  . Pack years: 90.00  . Types: Cigarettes  . Last attempt to quit: 06/02/2010  . Years since quitting: 8.2  Smokeless Tobacco Never Used  Tobacco Comment   QUIT IN 2011   Counseling given: Yes Comment: QUIT IN 2011  Continue not smoking  Outpatient Encounter Medications as of 08/19/2018  Medication Sig  . carvedilol (COREG) 12.5 MG tablet TAKE 1 TABLET (12.5 MG TOTAL) BY MOUTH 2 (TWO) TIMES DAILY.  Marland Kitchen clopidogrel (PLAVIX) 75 MG tablet TAKE 1 TABLET BY MOUTH EVERY DAY WITH BREAKFAST  . desvenlafaxine (PRISTIQ) 50 MG 24 hr tablet Take 1 tablet (50 mg total) by mouth daily.  . diazepam (VALIUM) 2 MG tablet Take 1 tablet (2 mg total) by mouth daily as needed for anxiety.  . diclofenac sodium (VOLTAREN) 1 % GEL APPLY 2 GRAMS TO BOTH HANDS 4 TIMES A DAY  . Eyelid Cleansers (OCUSOFT BABY EYELID & EYELASH EX) Apply topically.  . furosemide (LASIX) 40 MG tablet TAKE 1 TABLET BY MOUTH EVERY DAY  . gabapentin (NEURONTIN) 100 MG capsule Take 100-200 mg by mouth See admin instructions. 100 mg  in am, 200 mg at noon and  200 mg at bedtime  . HYDROcodone-acetaminophen (NORCO/VICODIN) 5-325 MG tablet Take 1 tablet by mouth 3 (three) times daily.   Marland Kitchen ipratropium (ATROVENT) 0.02 % nebulizer solution USE 1 VIAL BY NEBULIZATION 4 (FOUR) TIMES DAILY.  Marland Kitchen losartan (COZAAR) 25 MG tablet TAKE 1 TABLET BY MOUTH EVERY DAY  . montelukast (SINGULAIR) 10 MG tablet TAKE 1 TABLET BY MOUTH EVERYDAY AT BEDTIME  . ondansetron (ZOFRAN ODT) 8 MG disintegrating tablet Take 1 tablet (8 mg total) by mouth every 8 (eight) hours as needed for nausea or vomiting.  Marland Kitchen oxybutynin (DITROPAN-XL) 10 MG 24 hr tablet TAKE 10 MG BY MOUTH EVERY DAY  . OXYGEN Inhale 1.5-2 L into the lungs as needed (for shortness of breath).  . pantoprazole (PROTONIX) 40 MG tablet Take 40 mg by mouth daily.   . polyethylene glycol (MIRALAX / GLYCOLAX) packet Take 17 g by mouth every other day.   . rosuvastatin (CRESTOR) 5 MG tablet Take 1 tablet (5 mg total) by mouth 2 (two) times a week. Sunday and Wednesday  . spironolactone (ALDACTONE) 25 MG tablet TAKE 1 TABLET BY MOUTH EVERY DAY  . tiZANidine (ZANAFLEX) 2 MG tablet Take 2 mg by mouth at bedtime.   . traZODone (DESYREL) 100 MG tablet Take 1 tablet (100 mg total) by mouth at bedtime.  Marland Kitchen warfarin (COUMADIN) 5 MG tablet TAKE 1/2 TO 1 TABLET BY MOUTH EVERY DAY OR AS DIRECTED BY COUMADIN CLINIC 11/13/17  . [DISCONTINUED] ipratropium (ATROVENT) 0.02 % nebulizer solution USE 1 VIAL BY NEBULIZATION 4 (FOUR) TIMES DAILY. (Patient taking differently: USE 1 VIAL BY NEBULIZATION  3 TIMES DAILY AS NEEDED FOR SHORTNESS OF BREATH)  . Glycopyrrolate-Formoterol (BEVESPI AEROSPHERE) 9-4.8 MCG/ACT AERO Inhale 2 puffs into the lungs 2 (two) times daily. (Patient not taking: Reported on 08/19/2018)  . nystatin (MYCOSTATIN) 100000 UNIT/ML suspension Take 5 mLs (500,000 Units total) by mouth 4 (four) times daily.  . [DISCONTINUED] nystatin (MYCOSTATIN) 100000 UNIT/ML suspension Take 5 mLs (500,000 Units total) by  mouth 4 (four) times daily. Swish and swallow 4 times daily (Patient not taking: Reported on 08/19/2018)   No facility-administered encounter medications on file as of 08/19/2018.     Review of Systems  Review of Systems  Constitutional: Positive for fatigue. Negative for chills, fever and unexpected weight change.  HENT: Positive for mouth sores (believes this is thrush ). Negative for congestion, ear pain, postnasal drip, sinus pressure and sinus pain.   Respiratory: Positive for cough and shortness of breath. Negative for chest tightness and wheezing.   Cardiovascular: Negative for chest pain and palpitations.  Gastrointestinal: Negative for blood in stool, diarrhea, nausea and vomiting.  Genitourinary: Negative for dysuria, frequency and urgency.  Musculoskeletal: Negative for arthralgias.  Skin: Negative for color change.  Allergic/Immunologic: Negative for environmental allergies and food allergies.  Neurological: Negative for dizziness, light-headedness and headaches.  Psychiatric/Behavioral: Positive for dysphoric mood. The patient is not nervous/anxious.   All other systems reviewed and are negative.    Physical Exam  BP 130/74 (BP Location: Right Wrist)   Pulse 86   Ht '5\' 3"'  (1.6 m)   Wt 196 lb 3.2 oz (89 kg)   SpO2 (!) 88% Comment: pt states 87 is normal and does not want O2  BMI 34.76 kg/m   Pt took BP with her personal wrist cuff that she brought. Pt refused cuff   Wt Readings from Last 5 Encounters:  08/19/18 196 lb 3.2 oz (89 kg)  07/21/18 193 lb 6.4 oz (87.7 kg)  07/18/18 191 lb 9.6 oz (86.9 kg)  05/03/18 192 lb (87.1 kg)  04/13/18 186 lb (84.4 kg)    Physical Exam  Constitutional: She is oriented to person, place, and time and well-developed, well-nourished, and in no distress. No distress.  HENT:  Head: Normocephalic and atraumatic.  Right Ear: Hearing, external ear and ear canal normal.  Left Ear: Hearing, external ear and ear canal normal.  Nose:  Mucosal edema present. Right sinus exhibits no maxillary sinus tenderness and no frontal sinus tenderness. Left sinus exhibits no maxillary sinus tenderness and no frontal sinus tenderness.  Mouth/Throat: Uvula is midline and oropharynx is clear and moist. Dental caries present. No oropharyngeal exudate.    TMs with effusions bilaterally without infection  Eyes: Pupils are equal, round, and reactive to light.  Neck: Normal range of motion. Neck supple. No JVD present.  Cardiovascular: Normal rate, regular rhythm and normal heart sounds.  Pulmonary/Chest: Effort normal and breath sounds normal. No accessory muscle usage. No respiratory distress. She has no decreased breath sounds. She has no wheezes. She has no rhonchi.  Abdominal: Soft. Bowel sounds are normal. There is tenderness.  Musculoskeletal: Normal range of motion. She exhibits edema.  Lymphadenopathy:    She has no cervical adenopathy.  Neurological: She is alert and oriented to person, place, and time. Gait normal.  Patient reports pain throughout exam with palpation due to chronic fibromyalgia.  Patient reports this is her baseline..  Skin: Skin is warm and dry. She is not diaphoretic. No erythema.  Psychiatric: Memory and judgment normal. She exhibits a depressed mood. She has a flat affect.  Patient is concerned about her health due to her myriad of chronic health issues.  I have encouraged her to contact cardiology as well as her vascular team so they can better answer her questions regarding a potential surgery and what her next that may be.  Nursing note and vitals reviewed.    Lab Results:  CBC    Component Value Date/Time   WBC 7.0 05/03/2018 1142   WBC 8.5 01/18/2018 0614   RBC 4.30 05/03/2018 1142   RBC 4.01 01/18/2018 0614   HGB 12.5 05/03/2018 1142   HCT  39.6 05/03/2018 1142   PLT 237 05/03/2018 1142   MCV 92 05/03/2018 1142   MCH 29.1 05/03/2018 1142   MCH 29.7 01/18/2018 0614   MCHC 31.6 05/03/2018 1142    MCHC 32.5 01/18/2018 0614   RDW 14.3 05/03/2018 1142   LYMPHSABS 1.4 04/13/2018 0957   MONOABS 0.6 02/11/2017 0146   EOSABS 0.1 04/13/2018 0957   BASOSABS 0.0 04/13/2018 0957    BMET    Component Value Date/Time   NA 136 05/03/2018 1142   K 4.5 05/03/2018 1142   CL 94 (L) 05/03/2018 1142   CO2 24 05/03/2018 1142   GLUCOSE 101 (H) 05/03/2018 1142   GLUCOSE 115 (H) 01/18/2018 0614   BUN 17 05/03/2018 1142   CREATININE 0.98 05/03/2018 1142   CREATININE 0.91 08/18/2016 0843   CALCIUM 9.6 05/03/2018 1142   GFRNONAA 58 (L) 05/03/2018 1142   GFRNONAA 63 07/11/2015 0828   GFRAA 67 05/03/2018 1142   GFRAA 73 07/11/2015 0828    BNP    Component Value Date/Time   BNP 523.2 (H) 02/12/2017 0331    ProBNP    Component Value Date/Time   PROBNP 129.0 (H) 09/19/2014 1056      Assessment & Plan:   72 year old female patient presenting today for follow-up visit.  Unfortunately we are quite limited in management of Ms. Bryants care due to her significant list of allergies and intolerances to medications.  I suggested that we could try you pylori if we stopped her Atrovent.  Patient declined patient would like to continue with Atrovent at this time.  I do not see any other medications that potentially could be used to help manage her breathing as she has allergies to budesonide, formoterol, cannot take nasal glucocorticoids, etc.   I have encouraged the patient to follow-up with vascular as well as cardiology to see if they could further answer her questions as well as develop a game plan moving forward with her health.  I believe from a pulmonary standpoint and management of her COPD she can follow-up in 3 months to see Dr. [name].  If there was a potential surgery moving forward patient would need a follow-up visit to evaluate her for surgical risk.  As of right now she would be a moderate to high pulmonary risk due to her severe emphysema as well as oxygen needs.  COPD, severe  (HCC) Refilled Atrovent every 4 hours as needed   Continue 2L of O2 with exertion and rest  >>>maintian 02 sats greater than 88 percent   Follow up with Dr. [name] in 3 months     Chronic respiratory failure with hypoxia (HCC) Continue 2L of O2 with exertion and rest  >>>maintian 02 sats greater than 88 percent   Follow up with Dr. [name] in 3 months     Thrush Nystatin rinse reordered   Follow up with Dr. [name] in 3 months        Lauraine Rinne, NP 08/19/2018

## 2018-08-19 ENCOUNTER — Other Ambulatory Visit: Payer: Self-pay | Admitting: *Deleted

## 2018-08-19 ENCOUNTER — Encounter: Payer: Self-pay | Admitting: Pulmonary Disease

## 2018-08-19 ENCOUNTER — Ambulatory Visit (INDEPENDENT_AMBULATORY_CARE_PROVIDER_SITE_OTHER): Payer: Medicare Other | Admitting: Pulmonary Disease

## 2018-08-19 ENCOUNTER — Telehealth: Payer: Self-pay | Admitting: Cardiovascular Disease

## 2018-08-19 VITALS — BP 130/74 | HR 86 | Ht 63.0 in | Wt 196.2 lb

## 2018-08-19 DIAGNOSIS — J9611 Chronic respiratory failure with hypoxia: Secondary | ICD-10-CM

## 2018-08-19 DIAGNOSIS — B370 Candidal stomatitis: Secondary | ICD-10-CM | POA: Diagnosis not present

## 2018-08-19 DIAGNOSIS — J449 Chronic obstructive pulmonary disease, unspecified: Secondary | ICD-10-CM | POA: Diagnosis not present

## 2018-08-19 DIAGNOSIS — I739 Peripheral vascular disease, unspecified: Secondary | ICD-10-CM

## 2018-08-19 DIAGNOSIS — I6523 Occlusion and stenosis of bilateral carotid arteries: Secondary | ICD-10-CM

## 2018-08-19 MED ORDER — NYSTATIN 100000 UNIT/ML MT SUSP: 5.0000 mL | Freq: Four times a day (QID) | OROMUCOSAL | 0 refills | Status: DC

## 2018-08-19 MED ORDER — IPRATROPIUM BROMIDE 0.02 % IN SOLN: RESPIRATORY_TRACT | 3 refills | Status: DC

## 2018-08-19 NOTE — Assessment & Plan Note (Signed)
Refilled Atrovent every 4 hours as needed   Continue 2L of O2 with exertion and rest  >>>maintian 02 sats greater than 88 percent   Follow up with Dr. [name] in 3 months

## 2018-08-19 NOTE — Assessment & Plan Note (Signed)
Continue 2L of O2 with exertion and rest  >>>maintian 02 sats greater than 88 percent   Follow up with Dr. [name] in 3 months

## 2018-08-19 NOTE — Telephone Encounter (Signed)
New message:      Pt is calling to check on the status of her test she had the other day. Pt states to please leave a vm if she is unable to answer.

## 2018-08-19 NOTE — Patient Instructions (Addendum)
Follow up with vascular regarding your questions with surgery  Follow up with Dr. Gwenlyn Found regarding your questions with your stent   Refilled Atrovent every 4 hours as needed   Continue 2L of O2 with exertion and rest  >>>maintian 02 sats greater than 88 percent   Nystatin rinse reordered   Follow up with Dr. [name] in 3 months      November/2019 we will be moving! We will no longer be at our [place] location.  Be on the look out for a post card/mailer to let you know we have officially moved.  Our new address and phone number will be:  [address] [place], [place] [address] Telephone number: [phone]  It is flu season:   >>>Remember to be washing your hands regularly, using hand sanitizer, be careful to use around herself with has contact with people who are sick will increase her chances of getting sick yourself. >>> Best ways to protect herself from the flu: Receive the yearly flu vaccine, practice good hand hygiene washing with soap and also using hand sanitizer when available, eat a nutritious meals, get adequate rest, hydrate appropriately   Please contact the office if your symptoms worsen or you have concerns that you are not improving.   Thank you for choosing [facility] for your healthcare, and for allowing [place] to partner with you on your healthcare journey. I am thankful to be able to provide care to you today.   Wyn Quaker FNP-C

## 2018-08-19 NOTE — Telephone Encounter (Signed)
Returned call to patient.Dr.Berry advised needs appointment with him to discuss doppler results.Appointment already scheduled 09/02/18 at 9:00 am.

## 2018-08-19 NOTE — Assessment & Plan Note (Signed)
Nystatin rinse reordered   Follow up with Dr. [name] in 3 months

## 2018-08-24 ENCOUNTER — Encounter (HOSPITAL_COMMUNITY): Payer: Self-pay

## 2018-08-29 ENCOUNTER — Other Ambulatory Visit: Payer: Self-pay

## 2018-08-29 ENCOUNTER — Emergency Department (HOSPITAL_COMMUNITY)
Admission: EM | Admit: 2018-08-29 | Discharge: 2018-08-29 | Disposition: A | Discharge status: Home / Self Care | Payer: Medicare Other | Attending: Emergency Medicine | Admitting: Emergency Medicine

## 2018-08-29 ENCOUNTER — Encounter (HOSPITAL_COMMUNITY): Payer: Self-pay | Admitting: Emergency Medicine

## 2018-08-29 DIAGNOSIS — J449 Chronic obstructive pulmonary disease, unspecified: Secondary | ICD-10-CM | POA: Diagnosis not present

## 2018-08-29 DIAGNOSIS — R040 Epistaxis: Secondary | ICD-10-CM | POA: Diagnosis present

## 2018-08-29 DIAGNOSIS — I5030 Unspecified diastolic (congestive) heart failure: Secondary | ICD-10-CM | POA: Insufficient documentation

## 2018-08-29 DIAGNOSIS — I110 Hypertensive heart disease with heart failure: Secondary | ICD-10-CM | POA: Diagnosis not present

## 2018-08-29 DIAGNOSIS — Z87891 Personal history of nicotine dependence: Secondary | ICD-10-CM | POA: Diagnosis not present

## 2018-08-29 DIAGNOSIS — Z7902 Long term (current) use of antithrombotics/antiplatelets: Secondary | ICD-10-CM | POA: Insufficient documentation

## 2018-08-29 DIAGNOSIS — Z79899 Other long term (current) drug therapy: Secondary | ICD-10-CM | POA: Diagnosis not present

## 2018-08-29 DIAGNOSIS — Z7901 Long term (current) use of anticoagulants: Secondary | ICD-10-CM | POA: Diagnosis not present

## 2018-08-29 LAB — BASIC METABOLIC PANEL
ANION GAP: 10 (ref 5–15)
BUN: 24 mg/dL — ABNORMAL HIGH (ref 8–23)
CALCIUM: 9.7 mg/dL (ref 8.9–10.3)
CO2: 30 mmol/L (ref 22–32)
Chloride: 97 mmol/L — ABNORMAL LOW (ref 98–111)
Creatinine, Ser: 1.02 mg/dL — ABNORMAL HIGH (ref 0.44–1.00)
GFR calc non Af Amer: 54 mL/min — ABNORMAL LOW (ref >60)
Glucose, Bld: 161 mg/dL — ABNORMAL HIGH (ref 70–99)
Potassium: 4.1 mmol/L (ref 3.5–5.1)
Sodium: 137 mmol/L (ref 135–145)

## 2018-08-29 LAB — CBC WITH DIFFERENTIAL/PLATELET
Abs Immature Granulocytes: 0.06 10*3/uL (ref 0.00–0.07)
Basophils Absolute: 0 10*3/uL (ref 0.0–0.1)
Basophils Relative: 0 %
Eosinophils Absolute: 0.1 10*3/uL (ref 0.0–0.5)
Eosinophils Relative: 1 %
HEMATOCRIT: 38.1 % (ref 36.0–46.0)
HEMOGLOBIN: 12.1 g/dL (ref 12.0–15.0)
IMMATURE GRANULOCYTES: 1 %
LYMPHS ABS: 0.9 10*3/uL (ref 0.7–4.0)
LYMPHS PCT: 7 %
MCH: 29.7 pg (ref 26.0–34.0)
MCHC: 31.8 g/dL (ref 30.0–36.0)
MCV: 93.6 fL (ref 80.0–100.0)
Monocytes Absolute: 0.7 10*3/uL (ref 0.1–1.0)
Monocytes Relative: 6 %
NEUTROS ABS: 10.8 10*3/uL — AB (ref 1.7–7.7)
NEUTROS PCT: 85 %
Platelets: 244 10*3/uL (ref 150–400)
RBC: 4.07 MIL/uL (ref 3.87–5.11)
RDW: 13.2 % (ref 11.5–15.5)
WBC: 12.6 10*3/uL — ABNORMAL HIGH (ref 4.0–10.5)
nRBC: 0 % (ref 0.0–0.2)

## 2018-08-29 LAB — PROTIME-INR
INR: 2.32
Prothrombin Time: 25.1 seconds — ABNORMAL HIGH (ref 11.4–15.2)

## 2018-08-29 MED ORDER — OXYMETAZOLINE HCL 0.05 % NA SOLN
1.0000 | Freq: Once | NASAL | Status: AC
  Administered 2018-08-29: 1 via NASAL
  Filled 2018-08-29: qty 15

## 2018-08-29 MED ORDER — TRANEXAMIC ACID 1000 MG/10ML IV SOLN
500.0000 mg | Freq: Once | INTRAVENOUS | Status: AC
  Administered 2018-08-29: 500 mg via TOPICAL
  Filled 2018-08-29: qty 10

## 2018-08-29 MED ORDER — TRANEXAMIC ACID 1000 MG/10ML IV SOLN: 500.0000 mg | Freq: Once | INTRAVENOUS | Status: DC

## 2018-08-29 NOTE — ED Provider Notes (Signed)
Mild oozing of blood from the packing but no further bleeding in the posterior pharynx.  Patient is stable for discharge home.  She will follow-up with Dr. [name] who she seen in the past approximately 1 year ago for nosebleeds.  She will continue using normal saline but states she does not tolerate Afrin.   Blanchie Dessert, MD 08/29/18 [phone]

## 2018-08-29 NOTE — ED Provider Notes (Signed)
[facility] DEPT Provider Note  CSN: [id number] Arrival date & time: 08/29/18 0865  Chief Complaint(s) Epistaxis  HPI Laurie Horton is a 72 y.o. female with extensive past medical history listed below including paroxysmal A. fib on Coumadin who presents to the emergency department with epistaxis that began at 9 PM last night.  Similar to prior episode.  Reports that this occurs due to supplemental oxygen.  Denies any trauma.  No recent fevers or sinus infections.  Has tried applying pressure and makeshift nasal packing.  Reports that her last INR was greater than 4 approximately 5 days ago.  Denies any shortness of breath, lightheadedness, chest pain.   HPI  Past Medical History Past Medical History:  Diagnosis Date  . CHF (congestive heart failure) ([place]) 03/02/2017   EF 25-30% 2018  . Complication of anesthesia    various issues with oxygen  saturations post op  . COPD (chronic obstructive pulmonary disease) ([place])   . Depression   . Diverticulitis   . DVT (deep venous thrombosis) ([place])   . Fibromyalgia   . GERD (gastroesophageal reflux disease)   . Hiatal hernia   . History of deviated nasal septum    left- side  . HTN (hypertension)   . Hyperlipidemia   . Obesity   . On supplemental oxygen therapy    concentrator at night @ 1.5 l/m or when sleeps. O2 Sat niormally 87.  . Osteoporosis   . PAT (paroxysmal atrial tachycardia) ([place])   . PFO (patent foramen ovale)   . PULMONARY NODULE, LEFT LOWER LOBE 10/14/2009   67mm LLL nodule dec 2010. Stable and 61mm in Oct 2012. No further fu  . PVD (peripheral vascular disease) with claudication ([place]) 12/2017  . Right middle lobe pneumonia ([place]) 07/24/2011   First noted at admit 07/10/11. Persists on cxr 07/22/11. Cleared on CT 08/24/11. No further followup  . Stroke ([place])   . TOBACCO ABUSE 06/04/2009   Patient Active Problem List   Diagnosis Date Noted  . Claudication in peripheral vascular disease ([place])  08/06/2017  . Class 1 obesity due to excess calories with serious comorbidity and body mass index (BMI) of 31.0 to 31.9 in adult 05/01/2017  . Chronic systolic CHF (congestive heart failure) ([place]) 03/04/2017  . Acute on chronic respiratory failure with hypoxia ([place]) 02/08/2017  . COPD, severe ([place]) 02/08/2017  . Acute diastolic heart failure, NYHA class 1 ([place]) 02/08/2017  . Fibromyalgia and chronic chest pain 02/08/2017  . Atrial fibrillation with RVR ([place]) 02/08/2017  . Acute heart failure ([place]) 02/08/2017  . Rapid atrial fibrillation ([place]) 02/08/2017  . Shortness of breath 02/08/2017  . Moderate episode of recurrent major depressive disorder ([place]) 01/04/2017  . Generalized anxiety disorder 01/04/2017  . Pure hypercholesterolemia 12/28/2016  . 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 Acute blood loss anemia 08/03/2016  . Carotid stenosis 04/02/2015  . Long term current use of anticoagulant therapy 09/26/2014  . PAF (paroxysmal atrial fibrillation) ([place]) 09/19/2014  . 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 Chronic respiratory failure with hypoxia ([place]) 06/27/2014  . 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 Idiopathic scoliosis and kyphoscoliosis 07/19/2013  . Hyperglycemia, drug-induced 10/12/2011  . Encounter for long-term (current) use of medications 08/21/2011  . Thrush 07/24/2011  . PULMONARY NODULE, LEFT LOWER LOBE 10/14/2009  . PATENT FORAMEN OVALE 10/14/2009  . Essential hypertension 06/04/2009  . G E R D 06/04/2009  . SNORING, HX OF  06/04/2009   Home Medication(s) Prior to Admission medications   Medication Sig Start Date End Date Taking? Authorizing Provider  carvedilol (COREG) 12.5 MG tablet TAKE 1 TABLET (12.5 MG TOTAL) BY MOUTH 2 (TWO) TIMES DAILY. 06/15/18 06/10/19  Martinique, Peter M, MD  clopidogrel (PLAVIX) 75 MG tablet TAKE 1 TABLET BY MOUTH EVERY DAY WITH BREAKFAST 08/08/18   Lorretta Harp, MD    desvenlafaxine (PRISTIQ) 50 MG 24 hr tablet Take 1 tablet (50 mg total) by mouth daily. 07/26/18   Cobos, Myer Peer, MD  diazepam (VALIUM) 2 MG tablet Take 1 tablet (2 mg total) by mouth daily as needed for anxiety. 07/26/18   Cobos, Myer Peer, MD  diclofenac sodium (VOLTAREN) 1 % GEL APPLY 2 GRAMS TO BOTH HANDS 4 TIMES A DAY 06/21/18   [provider]  Eyelid Cleansers (OCUSOFT BABY EYELID & EYELASH EX) Apply topically.    [provider]  furosemide (LASIX) 40 MG tablet TAKE 1 TABLET BY MOUTH EVERY DAY 06/27/18   Martinique, Peter M, MD  gabapentin (NEURONTIN) 100 MG capsule Take 100-200 mg by mouth See admin instructions. 100 mg in am, 200 mg at noon and  200 mg at bedtime 09/16/16   [provider]  Glycopyrrolate-Formoterol (BEVESPI AEROSPHERE) 9-4.8 MCG/ACT AERO Inhale 2 puffs into the lungs 2 (two) times daily. Patient not taking: Reported on 08/19/2018 07/18/18   Brand Males, MD  HYDROcodone-acetaminophen (NORCO/VICODIN) 5-325 MG tablet Take 1 tablet by mouth 3 (three) times daily.     [provider]  ipratropium (ATROVENT) 0.02 % nebulizer solution USE 1 VIAL BY NEBULIZATION 4 (FOUR) TIMES DAILY. 08/19/18   Lauraine Rinne, NP  losartan (COZAAR) 25 MG tablet TAKE 1 TABLET BY MOUTH EVERY DAY 05/12/18   Martinique, Peter M, MD  montelukast (SINGULAIR) 10 MG tablet TAKE 1 TABLET BY MOUTH EVERYDAY AT BEDTIME 04/13/18   Wardell Honour, MD  nystatin (MYCOSTATIN) 100000 UNIT/ML suspension Take 5 mLs (500,000 Units total) by mouth 4 (four) times daily. 08/19/18   Lauraine Rinne, NP  ondansetron (ZOFRAN ODT) 8 MG disintegrating tablet Take 1 tablet (8 mg total) by mouth every 8 (eight) hours as needed for nausea or vomiting. 04/13/18   Wardell Honour, MD  oxybutynin (DITROPAN-XL) 10 MG 24 hr tablet TAKE 10 MG BY MOUTH EVERY DAY 04/13/18   Wardell Honour, MD  OXYGEN Inhale 1.5-2 L into the lungs as needed (for shortness of breath).    [provider]   pantoprazole (PROTONIX) 40 MG tablet Take 40 mg by mouth daily.  07/24/13   [provider]  polyethylene glycol (MIRALAX / GLYCOLAX) packet Take 17 g by mouth every other day.     [provider]  rosuvastatin (CRESTOR) 5 MG tablet Take 1 tablet (5 mg total) by mouth 2 (two) times a week. Sunday and Wednesday 12/16/17   Lorretta Harp, MD  spironolactone (ALDACTONE) 25 MG tablet TAKE 1 TABLET BY MOUTH EVERY DAY 06/13/18   Bhagat, Bhavinkumar, PA  tiZANidine (ZANAFLEX) 2 MG tablet Take 2 mg by mouth at bedtime.     [provider]  traZODone (DESYREL) 100 MG tablet Take 1 tablet (100 mg total) by mouth at bedtime. 06/08/18   Arfeen, Arlyce Harman, MD  warfarin (COUMADIN) 5 MG tablet TAKE 1/2 TO 1 TABLET BY MOUTH EVERY DAY OR AS DIRECTED BY COUMADIN CLINIC 11/13/17 07/05/18   Lorretta Harp, MD  Past Surgical History Past Surgical History:  Procedure Laterality Date  . CARDIOVASCULAR STRESS TEST  12/26/2004   EF 74%. NO EVIDENCE OF ISCHEMIA  . ESOPHAGOGASTRODUODENOSCOPY (EGD) WITH PROPOFOL N/A 04/15/2015   Procedure: ESOPHAGOGASTRODUODENOSCOPY (EGD) WITH PROPOFOL;  Surgeon: Laurence Spates, MD;  Location: [facility];  Service: Endoscopy;  Laterality: N/A;  . KNEE ARTHROSCOPY  2000   left  . LAPAROSCOPIC CHOLECYSTECTOMY  04-16-2010   [facility]  . LOWER EXTREMITY ANGIOGRAPHY N/A 09/09/2017   Procedure: Lower Extremity Angiography;  Surgeon: Lorretta Harp, MD;  Location: [facility];  Service: Cardiovascular;  Laterality: N/A;  . LOWER EXTREMITY INTERVENTION Left 01/17/2018   Procedure: LOWER EXTREMITY INTERVENTION;  Surgeon: Lorretta Harp, MD;  Location: [facility];  Service: Cardiovascular;  Laterality: Left;  . MOUTH SURGERY     03-26-15 multiple extractions stitches remains  . PERIPHERAL VASCULAR INTERVENTION Left 01/17/2018    Procedure: PERIPHERAL VASCULAR INTERVENTION;  Surgeon: Lorretta Harp, MD;  Location: [facility];  Service: Cardiovascular;  Laterality: Left;  COMMON ILIAC  . RIGHT/LEFT HEART CATH AND CORONARY ANGIOGRAPHY N/A 03/04/2017   Procedure: Right/Left Heart Cath and Coronary Angiography;  Surgeon: Peter M Martinique, MD;  Location: [facility];  Service: Cardiovascular;  Laterality: N/A;  . TOTAL ABDOMINAL HYSTERECTOMY     post op needed oxygen was told "she gave them a scare"  . TUBAL LIGATION    . US ECHOCARDIOGRAPHY  11/20/2009   EF 55-60%   Family History Family History  Problem Relation Age of Onset  . Dementia Mother   . Diabetes Mother   . Alzheimer's disease Mother   . Heart attack Brother 69  . Heart attack Father   . Schizophrenia Sister   . Diabetes Sister   . Tremor Sister     Social History Social History   Tobacco Use  . Smoking status: Former Smoker    Packs/day: 3.00    Years: 30.00    Pack years: 90.00    Types: Cigarettes    Last attempt to quit: 06/02/2010    Years since quitting: 8.2  . Smokeless tobacco: Never Used  . Tobacco comment: QUIT IN 2011  Substance Use Topics  . Alcohol use: No    Alcohol/week: 0.0 standard drinks  . Drug use: No   Allergies Alprazolam; Bee venom; Iodine; Pseudoephedrine hcl er; Budesonide-formoterol fumarate; Crestor [rosuvastatin calcium]; Esomeprazole magnesium; Flonase [fluticasone propionate]; Loratadine; Lotrimin [clotrimazole]; Lunesta [eszopiclone]; Oxcarbazepine; Statins; Zolpidem tartrate; Betadine [povidone iodine]; Bevespi aerosphere [glycopyrrolate-formoterol]; Clarithromycin; Effexor [venlafaxine]; Lexapro [escitalopram oxalate]; Aciphex [rabeprazole sodium]; Alendronate sodium; Avelox [moxifloxacin hcl in nacl]; Bextra [valdecoxib]; Ceclor [cefaclor]; Cephalexin; Covera-hs [verapamil hcl]; Dicyclomine hcl; Other; and Tessalon perles  Review of Systems Review of Systems All other systems are reviewed and are  negative for acute change except as noted in the HPI  Physical Exam Vital Signs  I have reviewed the triage vital signs BP 125/90   Pulse (!) 101   Temp (!) 97 F (36.1 C) (Axillary)   Resp 18   Ht 5\' 4"  (1.626 m)   Wt 87.8 kg   SpO2 95%   BMI 33.23 kg/m   Physical Exam  Constitutional: She is oriented to person, place, and time. She appears well-developed and well-nourished. No distress.  HENT:  Head: Normocephalic and atraumatic.  Right Ear: External ear normal.  Left Ear: External ear normal.  Nose: Epistaxis (bilateral nares) is observed.  Eyes: Conjunctivae and EOM are normal. No scleral icterus.  Neck: Normal range of motion and phonation normal.  Cardiovascular: Normal rate and regular rhythm.  Pulmonary/Chest: Effort normal. No stridor. No respiratory distress.  Abdominal: She exhibits no distension.  Musculoskeletal: Normal range of motion. She exhibits no edema.  Neurological: She is alert and oriented to person, place, and time.  Skin: She is not diaphoretic.  Psychiatric: She has a normal mood and affect. Her behavior is normal.  Vitals reviewed.   ED Results and Treatments Labs (all labs ordered are listed, but only abnormal results are displayed) Labs Reviewed  CBC WITH DIFFERENTIAL/PLATELET - Abnormal; Notable for the following components:      Result Value   WBC 12.6 (*)    Neutro Abs 10.8 (*)    All other components within normal limits  BASIC METABOLIC PANEL - Abnormal; Notable for the following components:   Chloride 97 (*)    Glucose, Bld 161 (*)    BUN 24 (*)    Creatinine, Ser 1.02 (*)    GFR calc non Af Amer 54 (*)    All other components within normal limits  PROTIME-INR - Abnormal; Notable for the following components:   Prothrombin Time 25.1 (*)    All other components within normal limits                                                                                                                         EKG  EKG  Interpretation  Date/Time:    Ventricular Rate:    PR Interval:    QRS Duration:   QT Interval:    QTC Calculation:   R Axis:     Text Interpretation:        Radiology No results found. Pertinent labs & imaging results that were available during my care of the patient were reviewed by me and considered in my medical decision making (see chart for details).  Medications Ordered in ED Medications  tranexamic acid (CYKLOKAPRON) injection 500 mg (500 mg Topical Given 08/29/18 0646)  oxymetazoline (AFRIN) 0.05 % nasal spray 1 spray (1 spray Each Nare Given 08/29/18 0758)                                                                                                                                    Procedures .Epistaxis Management Date/Time: 08/29/2018 8:04 AM Performed by: Fatima Blank, MD Authorized by: Fatima Blank, MD  Consent:    Consent obtained:  Verbal   Consent given by:  Patient   Alternatives discussed:  Delayed treatment Anesthesia (see MAR for exact dosages):    Anesthesia method:  None Procedure details:    Treatment site:  L anterior and R anterior   Treatment method:  Anterior pack (TXA nebulizer)   Treatment complexity:  Limited   Treatment episode: recurring   Post-procedure details:    Assessment:  Bleeding decreased   Patient tolerance of procedure:  Tolerated well, no immediate complications    (including critical care time)  Medical Decision Making / ED Course I have reviewed the nursing notes for this encounter and the patient's prior records (if available in EHR or on provided paperwork).    Epistaxis.  Hemoglobin stable.  INR therapeutic at 2.3  Attempted hemostasis with TXA and nebulizer which did provide relief.  However patient had recurrent oozing.  Attempted packing and Afrin.  Bleeding stopped. Will monitor for another 1 hr to ensure it does not recur.   Patient care turned over to Dr Maryan Rued at [phone]. Patient  case and results discussed in detail; please see their note for further ED managment.    Final Clinical Impression(s) / ED Diagnoses Final diagnoses:  Epistaxis      This chart was dictated using voice recognition software.  Despite best efforts to proofread,  errors can occur which can change the documentation meaning.   Fatima Blank, MD 08/29/18 [phone]

## 2018-08-29 NOTE — ED Triage Notes (Signed)
Pt arriving POV with nose bleed that began around 9pm while she was taking her breathing treatment. Pt is on blood thinners.

## 2018-08-29 NOTE — Discharge Instructions (Addendum)
Make sure you are placing saline on the packing to keep it moist and use in the other nostril as well.  If bleeding becomes heavy or packing falls out and bleeding starts again please return

## 2018-09-02 ENCOUNTER — Encounter: Payer: Self-pay | Admitting: Cardiovascular Disease

## 2018-09-02 ENCOUNTER — Ambulatory Visit (INDEPENDENT_AMBULATORY_CARE_PROVIDER_SITE_OTHER): Payer: Medicare Other | Admitting: Pharmacist Clinician (PhC)/ Clinical Pharmacy Specialist

## 2018-09-02 ENCOUNTER — Ambulatory Visit (INDEPENDENT_AMBULATORY_CARE_PROVIDER_SITE_OTHER): Payer: Medicare Other | Admitting: Cardiovascular Disease

## 2018-09-02 VITALS — BP 132/70 | HR 67 | Ht 64.0 in | Wt 196.6 lb

## 2018-09-02 DIAGNOSIS — I10 Essential (primary) hypertension: Secondary | ICD-10-CM | POA: Diagnosis not present

## 2018-09-02 DIAGNOSIS — I480 Paroxysmal atrial fibrillation: Secondary | ICD-10-CM | POA: Diagnosis not present

## 2018-09-02 DIAGNOSIS — I739 Peripheral vascular disease, unspecified: Secondary | ICD-10-CM

## 2018-09-02 DIAGNOSIS — Z7901 Long term (current) use of anticoagulants: Secondary | ICD-10-CM | POA: Diagnosis not present

## 2018-09-02 LAB — POCT INR: INR: 2.8 (ref 2.0–3.0)

## 2018-09-02 NOTE — Progress Notes (Signed)
09/02/2018 CENIYAH THORP   September 01, 1946  [id number]  Primary Physician Rutherford Guys, MD Primary Cardiologist: Lorretta Harp MD Garret Reddish, [place], [place]  HPI:  Laurie Horton is a 72 y.o.  divorced Caucasian female mother of 31, grandmother of 78 grandchildren referred to me by Dr. Martinique for peripheral vascular evaluation because of limiting claudication.I last saw her in the office  12/24/2017. Marland KitchenShe does have a history of CAD status post right left heart cath performed 03/04/17 revealing severe 2 vessel obstructive CAD with occluded RCA and circumflex marginal disease. 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 She has a history of hypertension and hyperlipidemia. She also has fibromyalgia. She complains of left greater than right lower extremity claudication which is lifestyle limiting at less than a block. Lower extremity arterial Dopplers performed 07/28/17 revealed a right ABI 0.9 and a leftof .82. I perform abdominal aortography with bifemoral runoff 09/09/17 revealing an 80% calcified mid infrarenal abdominal aortic stenosis on a bend with a 25 mm gradient, 80% calcified left common iliac artery stenosis and what appeared to be a high-grade calcified right common femoral artery stenosis. She did see Dr. Trula Slade for consideration of aortobifemoral bypass grafting although she was evaluated by Dr. [name] who did not feel that she was adequate pulmonary candidate to undergo general anesthesia. She was referred back here for endovascular therapy.  I performed diamondback orbital rotational atherectomy, VBX covered stenting on her left common iliac artery 01/17/2018 with an excellent angiographic and clinical result.  Her claudication resolved and she was able to shop and walk her dog without limitation.  Unfortunately, over the last month she has had recurrent lifestyle limiting claudication in her left leg although the Doppler suggest continued  patency of her stent with waveforms suggesting that as well.  Current Meds  Medication Sig  . carvedilol (COREG) 12.5 MG tablet TAKE 1 TABLET (12.5 MG TOTAL) BY MOUTH 2 (TWO) TIMES DAILY.  Marland Kitchen clopidogrel (PLAVIX) 75 MG tablet TAKE 1 TABLET BY MOUTH EVERY DAY WITH BREAKFAST  . desvenlafaxine (PRISTIQ) 50 MG 24 hr tablet Take 1 tablet (50 mg total) by mouth daily.  . diazepam (VALIUM) 2 MG tablet Take 1 tablet (2 mg total) by mouth daily as needed for anxiety.  . diclofenac sodium (VOLTAREN) 1 % GEL APPLY 2 GRAMS TO BOTH HANDS 4 TIMES A DAY  . diphenhydrAMINE (BENADRYL) 25 mg capsule Take 25 mg by mouth as directed.  . Eyelid Cleansers (OCUSOFT BABY EYELID & EYELASH EX) Apply topically.  . furosemide (LASIX) 40 MG tablet TAKE 1 TABLET BY MOUTH EVERY DAY  . gabapentin (NEURONTIN) 100 MG capsule Take 100-200 mg by mouth See admin instructions. 100 mg in am, 200 mg at noon and  200 mg at bedtime  . Glycopyrrolate-Formoterol (BEVESPI AEROSPHERE) 9-4.8 MCG/ACT AERO Inhale 2 puffs into the lungs 2 (two) times daily.  Marland Kitchen HYDROcodone-acetaminophen (NORCO/VICODIN) 5-325 MG tablet Take 1 tablet by mouth 3 (three) times daily.   Marland Kitchen ipratropium (ATROVENT) 0.02 % nebulizer solution USE 1 VIAL BY NEBULIZATION 4 (FOUR) TIMES DAILY.  Marland Kitchen losartan (COZAAR) 25 MG tablet TAKE 1 TABLET BY MOUTH EVERY DAY  . montelukast (SINGULAIR) 10 MG tablet TAKE 1 TABLET BY MOUTH EVERYDAY AT BEDTIME  . ondansetron (ZOFRAN ODT) 8 MG disintegrating tablet Take 1 tablet (8 mg total) by mouth every 8 (eight) hours as needed for nausea or vomiting.  Marland Kitchen oxybutynin (DITROPAN-XL) 10 MG 24 hr tablet TAKE 10 MG  BY MOUTH EVERY DAY  . OXYGEN Inhale 1.5-2 L into the lungs as needed (for shortness of breath).  Marland Kitchen oxymetazoline (AFRIN) 0.05 % nasal spray Place 1 spray into both nostrils as directed.  . pantoprazole (PROTONIX) 40 MG tablet Take 40 mg by mouth daily.   . polyethylene glycol (MIRALAX / GLYCOLAX) packet Take 17 g by mouth every other  day.   . rosuvastatin (CRESTOR) 5 MG tablet Take 1 tablet (5 mg total) by mouth 2 (two) times a week. Sunday and Wednesday  . spironolactone (ALDACTONE) 25 MG tablet TAKE 1 TABLET BY MOUTH EVERY DAY  . tiZANidine (ZANAFLEX) 2 MG tablet Take 2 mg by mouth at bedtime.   . traZODone (DESYREL) 100 MG tablet Take 1 tablet (100 mg total) by mouth at bedtime.  Marland Kitchen warfarin (COUMADIN) 5 MG tablet TAKE 1/2 TO 1 TABLET BY MOUTH EVERY DAY OR AS DIRECTED BY COUMADIN CLINIC 11/13/17     Allergies  Allergen Reactions  . Alprazolam Anaphylaxis and Other (See Comments)    REACTION: stops breathing  . Bee Venom Anaphylaxis  . Iodine Anaphylaxis, Swelling and Other (See Comments)    REACTION: swelling in throat  . Pseudoephedrine Hcl Er Shortness Of Breath  . Budesonide-Formoterol Fumarate Other (See Comments)    Blisters inside of mouth all over  . Crestor [Rosuvastatin Calcium] Other (See Comments)    Unable to walk  . Esomeprazole Magnesium Other (See Comments)    REACTION: "bouncing off walls"  . Flonase [Fluticasone Propionate] Other (See Comments)    NOSE BLEED  . Loratadine Other (See Comments)    claritin D causes shaking  . Lotrimin [Clotrimazole] Other (See Comments)    Mouth blisters  . Lunesta [Eszopiclone] Other (See Comments)    REACTION: "slept for a week"  . Oxcarbazepine Other (See Comments)    Causes deep sleep and dizziness  . Statins Other (See Comments)    Can't walk, legs won't work   . Zolpidem Tartrate Other (See Comments)    REACTION: "slept for a week"  . Betadine [Povidone Iodine] Other (See Comments)    Breathing problems  . Bevespi Aerosphere [Glycopyrrolate-Formoterol] Other (See Comments)    Pt believes this caused mouth sores and thrush   . Clarithromycin Other (See Comments)    All "mycins", Puts into "a" fib, Will take if has to for severe sinus infection  . Effexor [Venlafaxine] Nausea And Vomiting and Other (See Comments)    cramps  . Lexapro [Escitalopram  Oxalate] Other (See Comments)    hallucinations  . Aciphex [Rabeprazole Sodium] Rash  . Alendronate Sodium Other (See Comments)    "caused stomach problems for 3 days"  . Avelox [Moxifloxacin Hcl In Nacl] Other (See Comments)    Stomach cramps.   Darlin Coco [Valdecoxib] Rash  . Ceclor [Cefaclor] Rash  . Cephalexin Rash and Other (See Comments)    Pt states that she is possibly allergic to this - had a reaction to Cefaclor in the past and she does not want to these class drugs. Added per patient request.  . Covera-Hs [Verapamil Hcl] Palpitations  . Dicyclomine Hcl Rash  . Other Other (See Comments)    Glue from ekg/heart monitor leads --rash, Any MYCINS  . Tessalon Perles Rash    Social History   Socioeconomic History  . Marital status: Divorced    Spouse name: Not on file  . Number of children: 2  . Years of education: Not on file  . Highest education level:  Not on file  Occupational History  . Occupation: disability  Social Needs  . Financial resource strain: Not on file  . Food insecurity:    Worry: Not on file    Inability: Not on file  . Transportation needs:    Medical: Not on file    Non-medical: Not on file  Tobacco Use  . Smoking status: Former Smoker    Packs/day: 3.00    Years: 30.00    Pack years: 90.00    Types: Cigarettes    Last attempt to quit: 06/02/2010    Years since quitting: 8.2  . Smokeless tobacco: Never Used  . Tobacco comment: QUIT IN 2011  Substance and Sexual Activity  . Alcohol use: No    Alcohol/week: 0.0 standard drinks  . Drug use: No  . Sexual activity: Yes    Partners: Male    Birth control/protection: Post-menopausal, Surgical  Lifestyle  . Physical activity:    Days per week: Not on file    Minutes per session: Not on file  . Stress: Not on file  Relationships  . Social connections:    Talks on phone: Not on file    Gets together: Not on file    Attends religious service: Not on file    Active member of club or organization:  Not on file    Attends meetings of clubs or organizations: Not on file    Relationship status: Not on file  . Intimate partner violence:    Fear of current or ex partner: Not on file    Emotionally abused: Not on file    Physically abused: Not on file    Forced sexual activity: Not on file  Other Topics Concern  . Not on file  Social History Narrative   Marital status: divorced in 1978 after ten years; dating casually in 2019.      Children: 3 biological children; 3 court appointed children; 6 grandchildren; 5 gg      Lives: alone; children in [place] and [place].      Employment: retired age 59; disability for COPD, CVA at age 14.      Tobacco: former smoker; quit smoking 2011.      Alcohol: none      Exercise: walks dog four times per day; goes to pool three times per week.      ADLs: drives; no assistant devices; does have a walker.  Cleaning is limited in 2018.  Daughter helps with cleaning.  Does own grocery shopping.      Advanced Directives: YES; DNR/DNI; HCPOA: Rolan Lipa Martin/daughter youngest.  Blind.                 Review of Systems: General: negative for chills, fever, night sweats or weight changes.  Cardiovascular: negative for chest pain, dyspnea on exertion, edema, orthopnea, palpitations, paroxysmal nocturnal dyspnea or shortness of breath Dermatological: negative for rash Respiratory: negative for cough or wheezing Urologic: negative for hematuria Abdominal: negative for nausea, vomiting, diarrhea, bright red blood per rectum, melena, or hematemesis Neurologic: negative for visual changes, syncope, or dizziness All other systems reviewed and are otherwise negative except as noted above.    Blood pressure 132/70, pulse 67, height 5\' 4"  (1.626 m), weight 196 lb 9.6 oz (89.2 kg).  General appearance: alert and no distress Neck: no adenopathy, no JVD, supple, symmetrical, trachea midline, thyroid not enlarged, symmetric, no tenderness/mass/nodules and  Soft bilateral carotid bruits Lungs: clear to auscultation bilaterally Heart: regular rate and rhythm,  S1, S2 normal, no murmur, click, rub or gallop Extremities: extremities normal, atraumatic, no cyanosis or edema Pulses: 2+ and symmetric Skin: Skin color, texture, turgor normal. No rashes or lesions Neurologic: Alert and oriented X 3, normal strength and tone. Normal symmetric reflexes. Normal coordination and gait  EKG sinus rhythm at 67 with lateral T wave inversion.  I personally reviewed this EKG.  ASSESSMENT AND PLAN:   Claudication in peripheral vascular disease [facility]) Ms. Styron returns today for follow-up of her PAD.  She was originally sent to me by Dr. Martinique.  I performed abdominal aortography on her 09/09/2017 revealing 80% calcified infra renal abdominal aortic stenosis with a 25 mm gradient on a band, 80% calcified left common iliac artery stenosis and 80% calcified 5 right common femoral artery stenosis with three-vessel runoff bilaterally.  She was thought to be too high risk for percutaneous revascularization of her aorta or surgical given her pulmonary status.  Therefore, I performed diamondback orbital rotational atherectomy, VBX covered stenting of her calcified left common iliac artery stenosis 01/17/2018 with with an excellent angiographic and clinical result.  Claudication resolved and she was able to shop a walker and walk her dog without limitation.  Unfortunately, she says in the last month she has had recurrent claudication in her left leg although her Dopplers reveal stable ABIs, velocities in her left iliac waveforms.      Lorretta Harp MD FACP,FACC,FAHA, [facility] 09/02/2018 9:07 AM

## 2018-09-02 NOTE — Patient Instructions (Signed)
Medication Instructions:  Your physician recommends that you continue on your current medications as directed. Please refer to the Current Medication list given to you today.  If you need a refill on your cardiac medications before your next appointment, please call your pharmacy.   Lab work: none If you have labs (blood work) drawn today and your tests are completely normal, you will receive your results only by: Marland Kitchen MyChart Message (if you have MyChart) OR . A paper copy in the mail If you have any lab test that is abnormal or we need to change your treatment, we will call you to review the results.  Testing/Procedures: Your physician has requested that you have a carotid duplex. This test is an ultrasound of the carotid arteries in your neck. It looks at blood flow through these arteries that supply the brain with blood. Allow one hour for this exam. There are no restrictions or special instructions.  SCHEDULE IN OCT. 2020  Your physician has requested that you have an aorta/iliac duplex. During this test, an ultrasound is used to evaluate blood flow to the aorta and iliac arteries. Allow one hour for this exam. Do not eat after midnight the day before and avoid carbonated beverages.   Your physician has requested that you have an ankle brachial index (ABI). During this test an ultrasound and blood pressure cuff are used to evaluate the arteries that supply the arms and legs with blood. Allow thirty minutes for this exam. There are no restrictions or special instructions.  SCHEDULE IN MAY 2020   Follow-Up: At [facility], you and your health needs are our priority.  As part of our continuing mission to provide you with exceptional heart care, we have created designated Provider Care Teams.  These Care Teams include your primary Cardiologist (physician) and Advanced Practice Providers (APPs -  Physician Assistants and Nurse Practitioners) who all work together to provide you with the care  you need, when you need it. You will need a follow up appointment in 6 months.  Please call our office 2 months in advance to schedule this appointment.  You may see [name], MD or one of the following Advanced Practice Providers on your designated Care Team:   Kerin Ransom, PA-C Roby Lofts, [place] . Sande Rives, PA-C  Any Other Special Instructions Will Be Listed Below (If Applicable).

## 2018-09-02 NOTE — Assessment & Plan Note (Signed)
Laurie Horton returns today for follow-up of her PAD.  She was originally sent to me by Dr. Martinique.  I performed abdominal aortography on her 09/09/2017 revealing 80% calcified infra renal abdominal aortic stenosis with a 25 mm gradient on a band, 80% calcified left common iliac artery stenosis and 80% calcified 5 right common femoral artery stenosis with three-vessel runoff bilaterally.  She was thought to be too high risk for percutaneous revascularization of her aorta or surgical given her pulmonary status.  Therefore, I performed diamondback orbital rotational atherectomy, VBX covered stenting of her calcified left common iliac artery stenosis 01/17/2018 with with an excellent angiographic and clinical result.  Claudication resolved and she was able to shop a walker and walk her dog without limitation.  Unfortunately, she says in the last month she has had recurrent claudication in her left leg although her Dopplers reveal stable ABIs, velocities in her left iliac waveforms.

## 2018-09-15 ENCOUNTER — Ambulatory Visit (INDEPENDENT_AMBULATORY_CARE_PROVIDER_SITE_OTHER): Payer: Medicare Other | Admitting: Psychiatry

## 2018-09-15 ENCOUNTER — Encounter (HOSPITAL_COMMUNITY): Payer: Self-pay | Admitting: Psychiatry

## 2018-09-15 DIAGNOSIS — F331 Major depressive disorder, recurrent, moderate: Secondary | ICD-10-CM

## 2018-09-15 DIAGNOSIS — F411 Generalized anxiety disorder: Secondary | ICD-10-CM

## 2018-09-15 MED ORDER — TRAZODONE HCL 100 MG PO TABS: ORAL_TABLET | ORAL | 1 refills | Status: DC

## 2018-09-15 MED ORDER — DESVENLAFAXINE SUCCINATE ER 50 MG PO TB24: 50.0000 mg | ORAL_TABLET | Freq: Every day | ORAL | 2 refills | Status: DC

## 2018-09-15 MED ORDER — DIAZEPAM 2 MG PO TABS: 2.0000 mg | ORAL_TABLET | Freq: Every day | ORAL | 0 refills | Status: DC | PRN

## 2018-09-15 NOTE — Progress Notes (Signed)
[facility] MD/PA/NP OP Progress Note  09/15/2018 8:40 AM Laurie Horton  MRN:  [id number]  Chief Complaint: I was in the emergency room last month because of nosebleed and I was very anxious and nervous.  I used Valium to calm me down.  HPI: Laurie Horton came for her follow-up appointment.  She is taking the medication and she was doing fine until last month she had a nosebleed which did not stop and she went to the emergency room because she was very scared.  In the emergency room she got treatment for blood loss and next few days she was tired but now she is recovering and feeling better.  She told that she was so anxious that she use the Valium almost every day.  She is sleeping most of the nights good as she takes half trazodone.  She understand that she need to work on her sleep hygiene and trying to avoid going late for sleep.  Overall she describes her depression is a stable on Pristiq.  She has no tremors, shakes or any EPS.  She is concerned about her sister-in-law who lives in [place] and going through chemotherapy and radiation for cancer.  Recently she find out that her knees also had throat cancer who lives in [place].  She feels bad because her niece has to travel to [facility] to take care of her mother and then also need to take care of her.  She wishes that she can help her sister-in-law more but due to long distance she cannot do it.  She is also sad because this year she will not have a Thanksgiving at her daughter's house because her daughter is working 12-hour shift.  She has a  good support from her friends and neighbors.  Patient denies any paranoia, hallucination, suicidal thoughts or homicidal thought.  She denies any mania or any psychosis.  She has no tremors or shakes.  She wants to continue her trazodone Pristiq and Valium 2 mg as needed.  She does not ask for early refills.  She is not drinking or using any illegal substances.  Visit Diagnosis:    ICD-10-CM   1. MDD (major depressive  disorder), recurrent episode, moderate (HCC) F33.1 traZODone (DESYREL) 100 MG tablet    desvenlafaxine (PRISTIQ) 50 MG 24 hr tablet    diazepam (VALIUM) 2 MG tablet  2. Generalized anxiety disorder F41.1 diazepam (VALIUM) 2 MG tablet    DISCONTINUED: diazepam (VALIUM) 2 MG tablet    Past Psychiatric History: Viewed. Patient denies any previous history of psychiatric inpatient treatment, suicidal attempt, paranoia, hallucination or any mania. She started seeing psychiatrist in 2006 after she had a stroke. She had tried Cymbalta, lexapro and Celexa in the past with limited response. She tried Ambien, Lunesta, lexaproand Xanax but developed allergies and side effects. We tried increasing Pristiq but she couldn't handle the side effects.  Past Medical History:  Past Medical History:  Diagnosis Date  . CHF (congestive heart failure) ([facility]) 03/02/2017   EF 25-30% 2018  . Complication of anesthesia    various issues with oxygen  saturations post op  . COPD (chronic obstructive pulmonary disease) ([place])   . Depression   . Diverticulitis   . DVT (deep venous thrombosis) ([facility])   . Fibromyalgia   . GERD (gastroesophageal reflux disease)   . Hiatal hernia   . History of deviated nasal septum    left- side  . HTN (hypertension)   . Hyperlipidemia   . Obesity   .  On supplemental oxygen therapy    concentrator at night @ 1.5 l/m or when sleeps. O2 Sat niormally 87.  . Osteoporosis   . PAT (paroxysmal atrial tachycardia) ([place])   . PFO (patent foramen ovale)   . PULMONARY NODULE, LEFT LOWER LOBE 10/14/2009   50m LLL nodule dec 2010. Stable and 477min Oct 2012. No further fu  . PVD (peripheral vascular disease) with claudication ([facility]03/2019  . Right middle lobe pneumonia ([facility]/21/2012   First noted at admit 07/10/11. Persists on cxr 07/22/11. Cleared on CT 08/24/11. No further followup  . Stroke ([facility]  . TOBACCO ABUSE 06/04/2009    Past Surgical History:  Procedure Laterality Date  .  CARDIOVASCULAR STRESS TEST  12/26/2004   EF 74%. NO EVIDENCE OF ISCHEMIA  . ESOPHAGOGASTRODUODENOSCOPY (EGD) WITH PROPOFOL N/A 04/15/2015   Procedure: ESOPHAGOGASTRODUODENOSCOPY (EGD) WITH PROPOFOL;  Surgeon: JaLaurence SpatesMD;  Location: [facility];  Service: Endoscopy;  Laterality: N/A;  . KNEE ARTHROSCOPY  2000   left  . LAPAROSCOPIC CHOLECYSTECTOMY  04-16-2010   [place]  . LOWER EXTREMITY ANGIOGRAPHY N/A 09/09/2017   Procedure: Lower Extremity Angiography;  Surgeon: BeLorretta HarpMD;  [facility];  Service: Cardiovascular;  Laterality: N/A;  . LOWER EXTREMITY INTERVENTION Left 01/17/2018   Procedure: LOWER EXTREMITY INTERVENTION;  Surgeon: BeLorretta HarpMD;  [facility];  Service: Cardiovascular;  Laterality: Left;  . MOUTH SURGERY     03-26-15 multiple extractions stitches remains  . PERIPHERAL VASCULAR INTERVENTION Left 01/17/2018   Procedure: PERIPHERAL VASCULAR INTERVENTION;  Surgeon: BeLorretta HarpMD;  [facility];  Service: Cardiovascular;  Laterality: Left;  COMMON ILIAC  . RIGHT/LEFT HEART CATH AND CORONARY ANGIOGRAPHY N/A 03/04/2017   Procedure: Right/Left Heart Cath and Coronary Angiography;  Surgeon: Peter M JoMartiniqueMD;  [facility];  Service: Cardiovascular;  Laterality: N/A;  . TOTAL ABDOMINAL HYSTERECTOMY     post op needed oxygen was told "she gave them a scare"  . TUBAL LIGATION    . [place]HY  11/20/2009   EF 55-60%    Family Psychiatric History: Reviewed.  Family History:  Family History  Problem Relation Age of Onset  . Dementia Mother   . Diabetes Mother   . Alzheimer's disease Mother   . Heart attack Brother 5710. Heart attack Father   . Schizophrenia Sister   . Diabetes Sister   . Tremor Sister     Social History:  Social History   Socioeconomic History  . Marital status: Divorced    Spouse name: Not on file  . Number of children: 2  . Years of education: Not  on file  . Highest education level: Not on file  Occupational History  . Occupation: disability  Social Needs  . Financial resource strain: Not on file  . Food insecurity:    Worry: Not on file    Inability: Not on file  . Transportation needs:    Medical: Not on file    Non-medical: Not on file  Tobacco Use  . Smoking status: Former Smoker    Packs/day: 3.00    Years: 30.00    Pack years: 90.00    Types: Cigarettes    Last attempt to quit: 06/02/2010    Years since quitting: 8.2  . Smokeless tobacco: Never Used  . Tobacco comment: QUIT IN 2011  Substance and Sexual Activity  . Alcohol use: No  Alcohol/week: 0.0 standard drinks  . Drug use: No  . Sexual activity: Yes    Partners: Male    Birth control/protection: Post-menopausal, Surgical  Lifestyle  . Physical activity:    Days per week: Not on file    Minutes per session: Not on file  . Stress: Not on file  Relationships  . Social connections:    Talks on phone: Not on file    Gets together: Not on file    Attends religious service: Not on file    Active member of club or organization: Not on file    Attends meetings of clubs or organizations: Not on file    Relationship status: Not on file  Other Topics Concern  . Not on file  Social History Narrative   Marital status: divorced in 1978 after ten years; dating casually in 2019.      Children: 3 biological children; 3 court appointed children; 6 grandchildren; 5 gg      Lives: alone; children in [place] and [place].      Employment: retired age 89; disability for COPD, CVA at age 4.      Tobacco: former smoker; quit smoking 2011.      Alcohol: none      Exercise: walks dog four times per day; goes to pool three times per week.      ADLs: drives; no assistant devices; does have a walker.  Cleaning is limited in 2018.  Daughter helps with cleaning.  Does own grocery shopping.      Advanced Directives: YES; DNR/DNI; HCPOA: Rolan Lipa Martin/daughter  youngest.  Blind.                Allergies:  Allergies  Allergen Reactions  . Alprazolam Anaphylaxis and Other (See Comments)    REACTION: stops breathing  . Bee Venom Anaphylaxis  . Iodine Anaphylaxis, Swelling and Other (See Comments)    REACTION: swelling in throat  . Pseudoephedrine Hcl Er Shortness Of Breath  . Budesonide-Formoterol Fumarate Other (See Comments)    Blisters inside of mouth all over  . Crestor [Rosuvastatin Calcium] Other (See Comments)    Unable to walk  . Esomeprazole Magnesium Other (See Comments)    REACTION: "bouncing off walls"  . Flonase [Fluticasone Propionate] Other (See Comments)    NOSE BLEED  . Loratadine Other (See Comments)    claritin D causes shaking  . Lotrimin [Clotrimazole] Other (See Comments)    Mouth blisters  . Lunesta [Eszopiclone] Other (See Comments)    REACTION: "slept for a week"  . Oxcarbazepine Other (See Comments)    Causes deep sleep and dizziness  . Statins Other (See Comments)    Can't walk, legs won't work   . Zolpidem Tartrate Other (See Comments)    REACTION: "slept for a week"  . Betadine [Povidone Iodine] Other (See Comments)    Breathing problems  . Bevespi Aerosphere [Glycopyrrolate-Formoterol] Other (See Comments)    Pt believes this caused mouth sores and thrush   . Clarithromycin Other (See Comments)    All "mycins", Puts into "a" fib, Will take if has to for severe sinus infection  . Effexor [Venlafaxine] Nausea And Vomiting and Other (See Comments)    cramps  . Lexapro [Escitalopram Oxalate] Other (See Comments)    hallucinations  . Aciphex [Rabeprazole Sodium] Rash  . Alendronate Sodium Other (See Comments)    "caused stomach problems for 3 days"  . Avelox [Moxifloxacin Hcl In Nacl] Other (See Comments)  Stomach cramps.   Darlin Coco [Valdecoxib] Rash  . Ceclor [Cefaclor] Rash  . Cephalexin Rash and Other (See Comments)    Pt states that she is possibly allergic to this - had a reaction to Cefaclor  in the past and she does not want to these class drugs. Added per patient request.  . Covera-Hs [Verapamil Hcl] Palpitations  . Dicyclomine Hcl Rash  . Other Other (See Comments)    Glue from ekg/heart monitor leads --rash, Any MYCINS  . Tessalon Perles Rash    Metabolic Disorder Labs: Recent Results (from the past 2160 hour(s))  POCT INR     Status: None   Collection Time: 06/21/18  7:40 AM  Result Value Ref Range   INR 2.9 2.0 - 3.0  POCT INR     Status: None   Collection Time: 07/21/18  8:30 AM  Result Value Ref Range   INR 2.5 2.0 - 3.0  POCT INR     Status: Abnormal   Collection Time: 08/17/18 10:02 AM  Result Value Ref Range   INR 4.2 (A) 2.0 - 3.0  CBC with Differential/Platelet     Status: Abnormal   Collection Time: 08/29/18  6:31 AM  Result Value Ref Range   WBC 12.6 (H) 4.0 - 10.5 K/uL   RBC 4.07 3.87 - 5.11 MIL/uL   Hemoglobin 12.1 12.0 - 15.0 g/dL   HCT 38.1 36.0 - 46.0 %   MCV 93.6 80.0 - 100.0 fL   MCH 29.7 26.0 - 34.0 pg   MCHC 31.8 30.0 - 36.0 g/dL   RDW 13.2 11.5 - 15.5 %   Platelets 244 150 - 400 K/uL   nRBC 0.0 0.0 - 0.2 %   Neutrophils Relative % 85 %   Neutro Abs 10.8 (H) 1.7 - 7.7 K/uL   Lymphocytes Relative 7 %   Lymphs Abs 0.9 0.7 - 4.0 K/uL   Monocytes Relative 6 %   Monocytes Absolute 0.7 0.1 - 1.0 K/uL   Eosinophils Relative 1 %   Eosinophils Absolute 0.1 0.0 - 0.5 K/uL   Basophils Relative 0 %   Basophils Absolute 0.0 0.0 - 0.1 K/uL   Immature Granulocytes 1 %   Abs Immature Granulocytes 0.06 0.00 - 0.07 K/uL    Comment: Performed at [facility], [place] [address], [place], [place] [address]  Basic metabolic panel     Status: Abnormal   Collection Time: 08/29/18  6:31 AM  Result Value Ref Range   Sodium 137 135 - 145 mmol/L   Potassium 4.1 3.5 - 5.1 mmol/L   Chloride 97 (L) 98 - 111 mmol/L   CO2 30 22 - 32 mmol/L   Glucose, Bld 161 (H) 70 - 99 mg/dL   BUN 24 (H) 8 - 23 mg/dL   Creatinine, Ser 1.02 (H) 0.44 - 1.00  mg/dL   Calcium 9.7 8.9 - 10.3 mg/dL   GFR calc non Af Amer 54 (L) >60 mL/min   GFR calc Af Amer >60 >60 mL/min    Comment: (NOTE) The eGFR has been calculated using the CKD EPI equation. This calculation has not been validated in all clinical situations. eGFR's persistently <60 mL/min signify possible Chronic Kidney Disease.    Anion gap 10 5 - 15    Comment: Performed at [facility], [place] [address], [place], [place] [address]  Protime-INR     Status: Abnormal   Collection Time: 08/29/18  6:31 AM  Result Value Ref Range  Prothrombin Time 25.1 (H) 11.4 - 15.2 seconds   INR 2.32     Comment: Performed at [facility], [place] [address], [place], [place] [address]  POCT INR     Status: None   Collection Time: 09/02/18  8:03 AM  Result Value Ref Range   INR 2.8 2.0 - 3.0   Lab Results  Component Value Date   HGBA1C 6.3 (H) 04/13/2018   MPG 120 (H) 05/04/2013   No results found for: PROLACTIN Lab Results  Component Value Date   CHOL 183 04/13/2018   TRIG 158 (H) 04/13/2018   HDL 41 04/13/2018   CHOLHDL 4.5 (H) 04/13/2018   VLDL 25 07/02/2016   LDLCALC 110 (H) 04/13/2018   LDLCALC 99 10/20/2017   Lab Results  Component Value Date   TSH 4.560 (H) 01/11/2018   TSH 1.990 08/31/2017    Therapeutic Level Labs: No results found for: LITHIUM No results found for: VALPROATE No components found for:  CBMZ  Current Medications: Current Outpatient Medications  Medication Sig Dispense Refill  . carvedilol (COREG) 12.5 MG tablet TAKE 1 TABLET (12.5 MG TOTAL) BY MOUTH 2 (TWO) TIMES DAILY. 60 tablet 11  . clopidogrel (PLAVIX) 75 MG tablet TAKE 1 TABLET BY MOUTH EVERY DAY WITH BREAKFAST 90 tablet 3  . desvenlafaxine (PRISTIQ) 50 MG 24 hr tablet Take 1 tablet (50 mg total) by mouth daily. 30 tablet 2  . diazepam (VALIUM) 2 MG tablet Take 1 tablet (2 mg total) by mouth daily as needed for anxiety. 10 tablet 0  . diclofenac sodium (VOLTAREN) 1 %  GEL APPLY 2 GRAMS TO BOTH HANDS 4 TIMES A DAY  5  . diphenhydrAMINE (BENADRYL) 25 mg capsule Take 25 mg by mouth as directed.    . Eyelid Cleansers (OCUSOFT BABY EYELID & EYELASH EX) Apply topically.    . furosemide (LASIX) 40 MG tablet TAKE 1 TABLET BY MOUTH EVERY DAY 30 tablet 11  . gabapentin (NEURONTIN) 100 MG capsule Take 100-200 mg by mouth See admin instructions. 100 mg in am, 200 mg at noon and  200 mg at bedtime    . Glycopyrrolate-Formoterol (BEVESPI AEROSPHERE) 9-4.8 MCG/ACT AERO Inhale 2 puffs into the lungs 2 (two) times daily. 1 Inhaler 0  . HYDROcodone-acetaminophen (NORCO/VICODIN) 5-325 MG tablet Take 1 tablet by mouth 3 (three) times daily.     Marland Kitchen ipratropium (ATROVENT) 0.02 % nebulizer solution USE 1 VIAL BY NEBULIZATION 4 (FOUR) TIMES DAILY. 75 mL 3  . losartan (COZAAR) 25 MG tablet TAKE 1 TABLET BY MOUTH EVERY DAY 30 tablet 11  . montelukast (SINGULAIR) 10 MG tablet TAKE 1 TABLET BY MOUTH EVERYDAY AT BEDTIME 90 tablet 3  . ondansetron (ZOFRAN ODT) 8 MG disintegrating tablet Take 1 tablet (8 mg total) by mouth every 8 (eight) hours as needed for nausea or vomiting. 20 tablet 0  . oxybutynin (DITROPAN-XL) 10 MG 24 hr tablet TAKE 10 MG BY MOUTH EVERY DAY 90 tablet 3  . OXYGEN Inhale 1.5-2 L into the lungs as needed (for shortness of breath).    Marland Kitchen oxymetazoline (AFRIN) 0.05 % nasal spray Place 1 spray into both nostrils as directed.    . pantoprazole (PROTONIX) 40 MG tablet Take 40 mg by mouth daily.     . polyethylene glycol (MIRALAX / GLYCOLAX) packet Take 17 g by mouth every other day.     . rosuvastatin (CRESTOR) 5 MG tablet Take 1 tablet (5 mg total) by mouth 2 (two) times a week.  Sunday and Wednesday 24 tablet 3  . spironolactone (ALDACTONE) 25 MG tablet TAKE 1 TABLET BY MOUTH EVERY DAY 30 tablet 8  . tiZANidine (ZANAFLEX) 2 MG tablet Take 2 mg by mouth at bedtime.     . traZODone (DESYREL) 100 MG tablet Take 1 tablet (100 mg total) by mouth at bedtime. 30 tablet 2  .  warfarin (COUMADIN) 5 MG tablet TAKE 1/2 TO 1 TABLET BY MOUTH EVERY DAY OR AS DIRECTED BY COUMADIN CLINIC 11/13/17 90 tablet 0   No current facility-administered medications for this visit.      Musculoskeletal: Strength & Muscle Tone: within normal limits Gait & Station: normal Patient leans: N/A  Psychiatric Specialty Exam: ROS  Blood pressure (!) 145/82, pulse 86, height 5' 4" (1.626 m), weight 192 lb (87.1 kg), SpO2 95 %.Body mass index is 32.96 kg/m.  General Appearance: Casual  Eye Contact:  Good  Speech:  Clear and Coherent and Normal Rate  Volume:  Normal  Mood:  Anxious  Affect:  Congruent  Thought Process:  Goal Directed  Orientation:  Full (Time, Place, and Person)  Thought Content: Logical   Suicidal Thoughts:  No  Homicidal Thoughts:  No  Memory:  Immediate;   Good Recent;   Good Remote;   Good  Judgement:  Good  Insight:  Present  Psychomotor Activity:  Normal  Concentration:  Concentration: Fair and Attention Span: Fair  Recall:  Good  Fund of Knowledge: Good  Language: Good  Akathisia:  No  Handed:  Right  AIMS (if indicated): not done  Assets:  Communication Skills Desire for Improvement Housing Resilience Social Support  ADL's:  Intact  Cognition: WNL  Sleep:  Good   Screenings: PHQ2-9     Office Visit from 04/13/2018 in Primary Care at [facility] from 10/20/2017 in Primary Care at [facility] from 04/28/2017 in Primary Care at [facility] from 02/08/2017 in Primary Care at [facility] Erroneous Encounter from 12/01/2016 in Primary Care at [facility] Total Score  2  0  0  0  0  PHQ-9 Total Score  5  -  -  -  -       Assessment and Plan: Major depressive disorder, recurrent.  Generalized anxiety disorder.  Reassurance given.  I offered counseling deal with her psychosocial stressors but due to finances she cannot afford at this time.  Continue Pristiq 50 mg daily, trazodone 5200 mg as needed for insomnia and Valium 2 mg as  needed for anxiety.  Recommended to call us back if she has any question, concern or if she feels worsening of the symptoms.  Follow-up in 3 months.   Kathlee Nations, MD 09/15/2018, 8:40 AM

## 2018-09-23 ENCOUNTER — Ambulatory Visit (INDEPENDENT_AMBULATORY_CARE_PROVIDER_SITE_OTHER): Payer: Medicare Other | Admitting: Pharmacist Clinician (PhC)/ Clinical Pharmacy Specialist

## 2018-09-23 DIAGNOSIS — Z7901 Long term (current) use of anticoagulants: Secondary | ICD-10-CM | POA: Diagnosis not present

## 2018-09-23 DIAGNOSIS — I4891 Unspecified atrial fibrillation: Secondary | ICD-10-CM | POA: Diagnosis not present

## 2018-09-23 DIAGNOSIS — I480 Paroxysmal atrial fibrillation: Secondary | ICD-10-CM

## 2018-09-23 LAB — POCT INR: INR: 2.8 (ref 2.0–3.0)

## 2018-09-23 NOTE — Patient Instructions (Signed)
Description   Continue taking 5mg  (1 tablet) every Monday and Friday and 2.5mg  (1/2 tablet) all other days. Repeat INR in 4 weeks

## 2018-10-13 ENCOUNTER — Ambulatory Visit: Payer: Medicare Other | Admitting: Family Medicine

## 2018-10-25 ENCOUNTER — Ambulatory Visit (INDEPENDENT_AMBULATORY_CARE_PROVIDER_SITE_OTHER): Payer: Medicare Other | Admitting: Pharmacist Clinician (PhC)/ Clinical Pharmacy Specialist

## 2018-10-25 DIAGNOSIS — I4891 Unspecified atrial fibrillation: Secondary | ICD-10-CM | POA: Diagnosis not present

## 2018-10-25 DIAGNOSIS — I480 Paroxysmal atrial fibrillation: Secondary | ICD-10-CM

## 2018-10-25 DIAGNOSIS — Z7901 Long term (current) use of anticoagulants: Secondary | ICD-10-CM

## 2018-10-25 LAB — POCT INR: INR: 3.7 — AB (ref 2.0–3.0)

## 2018-10-25 NOTE — Patient Instructions (Signed)
Description   No warfarin today Tuesday Dec 24, then continue taking 5mg  (1 tablet) every Monday and Friday and 2.5mg  (1/2 tablet) all other days. Repeat INR in 2 weeks

## 2018-11-04 ENCOUNTER — Other Ambulatory Visit: Payer: Self-pay | Admitting: Cardiovascular Disease

## 2018-11-14 ENCOUNTER — Ambulatory Visit (INDEPENDENT_AMBULATORY_CARE_PROVIDER_SITE_OTHER): Payer: Medicare Other | Admitting: Pharmacist

## 2018-11-14 DIAGNOSIS — Z7901 Long term (current) use of anticoagulants: Secondary | ICD-10-CM

## 2018-11-14 DIAGNOSIS — I480 Paroxysmal atrial fibrillation: Secondary | ICD-10-CM | POA: Diagnosis not present

## 2018-11-14 LAB — POCT INR: INR: 2.4 (ref 2.0–3.0)

## 2018-11-22 ENCOUNTER — Telehealth: Payer: Self-pay

## 2018-11-22 ENCOUNTER — Other Ambulatory Visit: Payer: Self-pay | Admitting: Cardiovascular Disease

## 2018-11-22 ENCOUNTER — Ambulatory Visit: Payer: Self-pay | Admitting: Internal Medicine

## 2018-11-28 ENCOUNTER — Encounter: Payer: Self-pay | Admitting: Family Medicine

## 2018-11-28 ENCOUNTER — Ambulatory Visit (INDEPENDENT_AMBULATORY_CARE_PROVIDER_SITE_OTHER): Payer: Medicare Other | Admitting: Family Medicine

## 2018-11-28 ENCOUNTER — Other Ambulatory Visit: Payer: Self-pay

## 2018-11-28 VITALS — BP 132/82 | HR 91 | Temp 98.8°F | Ht 64.0 in | Wt 193.0 lb

## 2018-11-28 DIAGNOSIS — B372 Candidiasis of skin and nail: Secondary | ICD-10-CM

## 2018-11-28 DIAGNOSIS — Z1239 Encounter for other screening for malignant neoplasm of breast: Secondary | ICD-10-CM

## 2018-11-28 DIAGNOSIS — R739 Hyperglycemia, unspecified: Secondary | ICD-10-CM

## 2018-11-28 DIAGNOSIS — E7800 Pure hypercholesterolemia, unspecified: Secondary | ICD-10-CM | POA: Diagnosis not present

## 2018-11-28 DIAGNOSIS — I10 Essential (primary) hypertension: Secondary | ICD-10-CM | POA: Diagnosis not present

## 2018-11-28 DIAGNOSIS — T50905A Adverse effect of unspecified drugs, medicaments and biological substances, initial encounter: Secondary | ICD-10-CM

## 2018-11-28 LAB — POCT GLYCOSYLATED HEMOGLOBIN (HGB A1C): Hemoglobin A1C: 6.4 % — AB (ref 4.0–5.6)

## 2018-11-28 MED ORDER — NYSTATIN 100000 UNIT/GM EX CREA: 1.0000 "application " | TOPICAL_CREAM | Freq: Two times a day (BID) | CUTANEOUS | 2 refills | Status: DC

## 2018-11-28 NOTE — Progress Notes (Signed)
1/27/20209:09 AM  Laurie Horton January 13, 1946, 73 y.o. female [id number]  Chief Complaint  Patient presents with  . Diabetes    follow up on chronic condition    HPI:   Patient is a 73 y.o. female with past medical history significant for depression and anxiety, CHF, afib on warfarin, COPD,  Prediabetes, DDD lumbar spine, fibromylagia who presents today to establish care  Previous PCP Dr Tamala Julian Last OV June 2019  Declines flu vaccine as she got very sick after her last immunizations and she is still paying for her ambulance bills She uses 1.5-2L oxygen for sleep and when she exercises or exerts herself Quit smoking April 12 2000 She has not had to see neuro in a while as she has not had any issues She otherwise sees all her other specialists regularly Takes her crestor twice a week, as daily was causing mylagias Pain mgt, doing epidural injections, manages vicodin, sending her to PT Manages her prediabetes with diet She does not check her cbgs Requesting medication for worsening fungal infection beneath her panus, she wears adult diapers due to urinary incontinence  Patient Care Team: Rutherford Guys, MD as PCP - General (Family Medicine) Lorretta Harp, MD as PCP - Cardiology (Cardiology) Margaretha Sheffield, MD as Referring Physician (Physical Medicine and Rehabilitation) Laurence Spates, MD as Consulting Physician (Gastroenterology) Garvin Fila, MD as Consulting Physician (Neurology) Martinique, Peter M, MD as Consulting Physician (Cardiology) Brand Males, MD as Consulting Physician (Pulmonary Disease) Adele Schilder, Arlyce Harman, MD as Consulting Physician (Psychiatry) Lorretta Harp, MD as Consulting Physician (Peripheral Vascular Disease)  Lab Results  Component Value Date   HGBA1C 6.3 (H) 04/13/2018   HGBA1C 6.0 02/06/2016   HGBA1C 5.8 (H) 05/04/2013   Lab Results  Component Value Date   LDLCALC 110 (H) 04/13/2018   CREATININE 1.02 (H) 08/29/2018    Fall Risk   11/28/2018 04/13/2018 04/13/2018 10/20/2017 04/28/2017  Falls in the past year? 0 No No No No  Number falls in past yr: - - - - -  Comment - - - - -  Injury with Fall? - - - - -  Comment - - - - -  Follow up - - - - -     Depression screen [facility] 2/9 11/28/2018 04/13/2018 04/13/2018  Decreased Interest 0 1 0  Down, Depressed, Hopeless 0 1 0  PHQ - 2 Score 0 2 0  Altered sleeping - 1 -  Tired, decreased energy - 1 -  Change in appetite - 0 -  Feeling bad or failure about yourself  - 1 -  Trouble concentrating - 0 -  Moving slowly or fidgety/restless - 0 -  Suicidal thoughts - 0 -  PHQ-9 Score - 5 -  Difficult doing work/chores - - -  Some recent data might be hidden    Allergies  Allergen Reactions  . Alprazolam Anaphylaxis and Other (See Comments)    REACTION: stops breathing  . Bee Venom Anaphylaxis  . Iodine Anaphylaxis, Swelling and Other (See Comments)    REACTION: swelling in throat  . Pseudoephedrine Hcl Er Shortness Of Breath  . Budesonide-Formoterol Fumarate Other (See Comments)    Blisters inside of mouth all over  . Crestor [Rosuvastatin Calcium] Other (See Comments)    Unable to walk  . Esomeprazole Magnesium Other (See Comments)    REACTION: "bouncing off walls"  . Flonase [Fluticasone Propionate] Other (See Comments)    NOSE BLEED  . Loratadine Other (See Comments)  claritin D causes shaking  . Lotrimin [Clotrimazole] Other (See Comments)    Mouth blisters  . Lunesta [Eszopiclone] Other (See Comments)    REACTION: "slept for a week"  . Oxcarbazepine Other (See Comments)    Causes deep sleep and dizziness  . Statins Other (See Comments)    Can't walk, legs won't work   . Zolpidem Tartrate Other (See Comments)    REACTION: "slept for a week"  . Betadine [Povidone Iodine] Other (See Comments)    Breathing problems  . Bevespi Aerosphere [Glycopyrrolate-Formoterol] Other (See Comments)    Pt believes this caused mouth sores and thrush   . Clarithromycin Other  (See Comments)    All "mycins", Puts into "a" fib, Will take if has to for severe sinus infection  . Effexor [Venlafaxine] Nausea And Vomiting and Other (See Comments)    cramps  . Lexapro [Escitalopram Oxalate] Other (See Comments)    hallucinations  . Aciphex [Rabeprazole Sodium] Rash  . Alendronate Sodium Other (See Comments)    "caused stomach problems for 3 days"  . Avelox [Moxifloxacin Hcl In Nacl] Other (See Comments)    Stomach cramps.   Darlin Coco [Valdecoxib] Rash  . Ceclor [Cefaclor] Rash  . Cephalexin Rash and Other (See Comments)    Pt states that she is possibly allergic to this - had a reaction to Cefaclor in the past and she does not want to these class drugs. Added per patient request.  . Covera-Hs [Verapamil Hcl] Palpitations  . Dicyclomine Hcl Rash  . Other Other (See Comments)    Glue from ekg/heart monitor leads --rash, Any MYCINS  . Tessalon Perles Rash    Prior to Admission medications   Medication Sig Start Date End Date Taking? Authorizing Provider  carvedilol (COREG) 12.5 MG tablet TAKE 1 TABLET (12.5 MG TOTAL) BY MOUTH 2 (TWO) TIMES DAILY. 06/15/18 06/10/19 Yes Martinique, Peter M, MD  desvenlafaxine (PRISTIQ) 50 MG 24 hr tablet Take 1 tablet (50 mg total) by mouth daily. 09/15/18  Yes Arfeen, Arlyce Harman, MD  diazepam (VALIUM) 2 MG tablet Take 1 tablet (2 mg total) by mouth daily as needed for anxiety. 09/15/18  Yes Arfeen, Arlyce Harman, MD  diclofenac sodium (VOLTAREN) 1 % GEL APPLY 2 GRAMS TO BOTH HANDS 4 TIMES A DAY 06/21/18  Yes [provider]  diphenhydrAMINE (BENADRYL) 25 mg capsule Take 25 mg by mouth as directed.   Yes [provider]  Eyelid Cleansers (OCUSOFT BABY EYELID & EYELASH EX) Apply topically.   Yes [provider]  furosemide (LASIX) 40 MG tablet TAKE 1 TABLET BY MOUTH EVERY DAY 06/27/18  Yes Martinique, Peter M, MD  gabapentin (NEURONTIN) 100 MG capsule Take 100-200 mg by mouth See admin instructions. 100 mg in am, 200 mg at noon and   200 mg at bedtime 09/16/16  Yes [provider]  HYDROcodone-acetaminophen (NORCO/VICODIN) 5-325 MG tablet Take 1 tablet by mouth 3 (three) times daily.    Yes [provider]  ipratropium (ATROVENT) 0.02 % nebulizer solution USE 1 VIAL BY NEBULIZATION 4 (FOUR) TIMES DAILY. 08/19/18  Yes Lauraine Rinne, NP  losartan (COZAAR) 25 MG tablet TAKE 1 TABLET BY MOUTH EVERY DAY 05/12/18  Yes Martinique, Peter M, MD  montelukast (SINGULAIR) 10 MG tablet TAKE 1 TABLET BY MOUTH EVERYDAY AT BEDTIME 04/13/18  Yes Wardell Honour, MD  nystatin (MYCOSTATIN) 100000 UNIT/ML suspension Take 5 mLs by mouth 4 (four) times daily.   Yes [provider]  ondansetron [facility]  ODT) 8 MG disintegrating tablet Take 1 tablet (8 mg total) by mouth every 8 (eight) hours as needed for nausea or vomiting. 04/13/18  Yes Wardell Honour, MD  oxybutynin (DITROPAN-XL) 10 MG 24 hr tablet TAKE 10 MG BY MOUTH EVERY DAY 04/13/18  Yes Wardell Honour, MD  OXYGEN Inhale 1.5-2 L into the lungs as needed (for shortness of breath).   Yes [provider]  pantoprazole (PROTONIX) 40 MG tablet Take 40 mg by mouth daily.  07/24/13  Yes [provider]  polyethylene glycol (MIRALAX / GLYCOLAX) packet Take 17 g by mouth every other day.    Yes [provider]  rosuvastatin (CRESTOR) 5 MG tablet TAKE 1 TABLET BY MOUTH 2 TIMES A WEEK. SUNDAY AND WEDNESDAY 11/22/18  Yes Lorretta Harp, MD  spironolactone (ALDACTONE) 25 MG tablet TAKE 1 TABLET BY MOUTH EVERY DAY 06/13/18  Yes Bhagat, Bhavinkumar, PA  tiZANidine (ZANAFLEX) 2 MG tablet Take 2 mg by mouth at bedtime.    Yes [provider]  traZODone (DESYREL) 100 MG tablet Take 1/2 to one tab daily as needed for sleep 09/15/18  Yes Arfeen, Arlyce Harman, MD  warfarin (COUMADIN) 5 MG tablet TAKE 1/2 TO 1 TABLET BY MOUTH EVERY DAY OR AS DIRECTED BY COUMADIN CLINIC 11/04/18  Yes Lorretta Harp, MD    Past Medical History:  Diagnosis Date  . CHF (congestive  heart failure) ([place]) 03/02/2017   EF 25-30% 2018  . Complication of anesthesia    various issues with oxygen  saturations post op  . COPD (chronic obstructive pulmonary disease) ([place])   . Depression   . Diverticulitis   . DVT (deep venous thrombosis) ([place])   . Fibromyalgia   . GERD (gastroesophageal reflux disease)   . Hiatal hernia   . History of deviated nasal septum    left- side  . HTN (hypertension)   . Hyperlipidemia   . Obesity   . On supplemental oxygen therapy    concentrator at night @ 1.5 l/m or when sleeps. O2 Sat niormally 87.  . Osteoporosis   . PAT (paroxysmal atrial tachycardia) ([place])   . PFO (patent foramen ovale)   . PULMONARY NODULE, LEFT LOWER LOBE 10/14/2009   51m LLL nodule dec 2010. Stable and 445min Oct 2012. No further fu  . PVD (peripheral vascular disease) with claudication ([place]/2019  . Right middle lobe pneumonia ([place]/21/2012   First noted at admit 07/10/11. Persists on cxr 07/22/11. Cleared on CT 08/24/11. No further followup  . St[place] ([facility] [place]  . TOBACCO ABUSE 06/04/2009    Past Surgical History:  Procedure Laterality Date  . CARDIOVASCULAR STRESS TEST  12/26/2004   EF 74%. NO EVIDENCE OF ISCHEMIA  . ESOPHAGOGASTRODUODENOSCOPY (EGD) WITH PROPOFOL N/A 04/15/2015   Procedure: ESOPHAGOGASTRODUODENOSCOPY (EGD) WITH PROPOFOL;  Surgeon: JaLaurence SpatesMD;  Location: [facility];  Service: Endoscopy;  Laterality: N/A;  . KNEE ARTHROSCOPY  2000   left  . LAPAROSCOPIC CHOLECYSTECTOMY  04-16-2010   [place]  . LOWER EXTREMITY ANGIOGRAPHY N/A 09/09/2017   Procedure: Lower Extremity Angiography;  Surgeon: BeLorretta HarpMD;  [facility];  Service: Cardiovascular;  Laterality: N/A;  . LOWER EXTREMITY INTERVENTION Left 01/17/2018   Procedure: LOWER EXTREMITY INTERVENTION;  Surgeon: BeLorretta HarpMD;  [facility];  Service: Cardiovascular;  Laterality: Left;  . MOUTH SURGERY     03-26-15 multiple extractions stitches  remains  . PERIPHERAL VASCULAR INTERVENTION Left 01/17/2018  Procedure: PERIPHERAL VASCULAR INTERVENTION;  Surgeon: Lorretta Harp, MD;  Location: [place] [facility];  Service: Cardiovascular;  Laterality: Left;  COMMON ILIAC  . RIGHT/LEFT HEART CATH AND CORONARY ANGIOGRAPHY N/A 03/04/2017   Procedure: Right/Left Heart Cath and Coronary Angiography;  Surgeon: Peter M Martinique, MD;  Location: [place] [facility];  Service: Cardiovascular;  Laterality: N/A;  . TOTAL ABDOMINAL HYSTERECTOMY     post op needed oxygen was told "she gave them a scare"  . TUBAL LIGATION    . US ECHOCARDIOGRAPHY  11/20/2009   EF 55-60%    Social History   Tobacco Use  . Smoking status: Former Smoker    Packs/day: 3.00    Years: 30.00    Pack years: 90.00    Types: Cigarettes    Last attempt to quit: 06/02/2010    Years since quitting: 8.4  . Smokeless tobacco: Never Used  . Tobacco comment: QUIT IN 2011  Substance Use Topics  . Alcohol use: No    Alcohol/week: 0.0 standard drinks    Family History  Problem Relation Age of Onset  . Dementia Mother   . Diabetes Mother   . Alzheimer's disease Mother   . Heart attack Brother 57  . Heart attack Father   . Schizophrenia Sister   . Diabetes Sister   . Tremor Sister     Review of Systems  Constitutional: Negative for chills and fever.  Respiratory: Negative for cough and shortness of breath.   Cardiovascular: Negative for chest pain, palpitations and leg swelling.  Gastrointestinal: Negative for abdominal pain, nausea and vomiting.   Per hpi  OBJECTIVE:  Blood pressure 132/82, pulse 91, temperature 98.8 F (37.1 C), height '5\' 4"'  (1.626 m), weight 193 lb (87.5 kg), SpO2 93 %. Body mass index is 33.13 kg/m.   Wt Readings from Last 3 Encounters:  11/28/18 193 lb (87.5 kg)  09/02/18 196 lb 9.6 oz (89.2 kg)  08/29/18 193 lb 9.6 oz (87.8 kg)    Physical Exam Vitals signs and nursing note reviewed.  Constitutional:      Appearance: She is  well-developed.  HENT:     Head: Normocephalic and atraumatic.     Mouth/Throat:     Pharynx: No oropharyngeal exudate.  Eyes:     General: No scleral icterus.    Conjunctiva/sclera: Conjunctivae normal.     Pupils: Pupils are equal, round, and reactive to light.  Neck:     Musculoskeletal: Neck supple.  Cardiovascular:     Rate and Rhythm: Normal rate and regular rhythm.     Heart sounds: Normal heart sounds. No murmur. No friction rub. No gallop.   Pulmonary:     Effort: Pulmonary effort is normal.     Breath sounds: Normal breath sounds. No wheezing or rales.  Skin:    General: Skin is warm and dry.     Findings: Rash (mild erythematous plaques along groin folds and beneath pannus) present.  Neurological:     Mental Status: She is alert and oriented to person, place, and time.     Results for orders placed or performed in visit on 11/28/18 (from the past 24 hour(s))  POCT glycosylated hemoglobin (Hb A1C)     Status: Abnormal   Collection Time: 11/28/18  9:13 AM  Result Value Ref Range   Hemoglobin A1C 6.4 (A) 4.0 - 5.6 %   HbA1c POC (<> result, manual entry)     HbA1c, POC (prediabetic range)     HbA1c, POC (  controlled diabetic range)        ASSESSMENT and PLAN  1. Hyperglycemia, drug-induced Discussed with patient a1c slowly has been increasing. Discussed diet, carb monitoring, portion controls. Trying to be as active as possible and safely - Lipid panel - TSH - POCT glycosylated hemoglobin (Hb A1C)  2. Essential hypertension Controlled. Continue current regime.  - Lipid panel - TSH - CMP14+EGFR  3. Pure hypercholesterolemia Checking labs today, medications will be adjusted as needed.  - CMP14+EGFR  4. Screening for breast cancer - [facility] DIGITAL SCREENING BILATERAL; Future  5. Candidal intertrigo Discussed supportive measures. Discussed new meds r/se/b Other orders - nystatin cream (MYCOSTATIN); Apply 1 application topically 2 (two) times daily.     Return in about 6 months (around 05/29/2019).    Rutherford Guys, MD [facility] [place], [place] [address] Ph.  [phone] Fax [phone]

## 2018-11-28 NOTE — Patient Instructions (Addendum)
   If you have lab work done today you will be contacted with your lab results within the next 2 weeks.  If you have not heard from us then please contact us. The fastest way to get your results is to register for My Chart.   IF you received an x-ray today, you will receive an invoice from [facility]. Please contact [facility] at [phone] with questions or concerns regarding your invoice.   IF you received labwork today, you will receive an invoice from [facility]. Please contact [facility] at [phone] with questions or concerns regarding your invoice.   Our billing staff will not be able to assist you with questions regarding bills from these companies.  You will be contacted with the lab results as soon as they are available. The fastest way to get your results is to activate your My Chart account. Instructions are located on the last page of this paperwork. If you have not heard from us regarding the results in 2 weeks, please contact this office.     Prediabetes Eating Plan Prediabetes is a condition that causes blood sugar (glucose) levels to be higher than normal. This increases the risk for developing diabetes. In order to prevent diabetes from developing, your health care provider may recommend a diet and other lifestyle changes to help you:  Control your blood glucose levels.  Improve your cholesterol levels.  Manage your blood pressure. Your health care provider may recommend working with a diet and nutrition specialist (dietitian) to make a meal plan that is best for you. What are tips for following this plan? Lifestyle  Set weight loss goals with the help of your health care team. It is recommended that most people with prediabetes lose 7% of their current body weight.  Exercise for at least 30 minutes at least 5 days a week.  Attend a support group or seek ongoing support from a mental health counselor.  Take over-the-counter and prescription  medicines only as told by your health care provider. Reading food labels  Read food labels to check the amount of fat, salt (sodium), and sugar in prepackaged foods. Avoid foods that have: ? Saturated fats. ? Trans fats. ? Added sugars.  Avoid foods that have more than 300 milligrams (mg) of sodium per serving. Limit your daily sodium intake to less than 2,300 mg each day. Shopping  Avoid buying pre-made and processed foods. Cooking  Cook with olive oil. Do not use butter, lard, or ghee.  Bake, broil, grill, or boil foods. Avoid frying. Meal planning   Work with your dietitian to develop an eating plan that is right for you. This may include: ? Tracking how many calories you take in. Use a food diary, notebook, or mobile application to track what you eat at each meal. ? Using the glycemic index (GI) to plan your meals. The index tells you how quickly a food will raise your blood glucose. Choose low-GI foods. These foods take a longer time to raise blood glucose.  Consider following a Mediterranean diet. This diet includes: ? Several servings each day of fresh fruits and vegetables. ? Eating fish at least twice a week. ? Several servings each day of whole grains, beans, nuts, and seeds. ? Using olive oil instead of other fats. ? Moderate alcohol consumption. ? Eating small amounts of red meat and whole-fat dairy.  If you have high blood pressure, you may need to limit your sodium intake or follow a diet such as the   DASH eating plan. DASH is an eating plan that aims to lower high blood pressure. What foods are recommended? The items listed below may not be a complete list. Talk with your dietitian about what dietary choices are best for you. Grains Whole grains, such as whole-wheat or whole-grain breads, crackers, cereals, and pasta. Unsweetened oatmeal. Bulgur. Barley. Quinoa. Brown rice. Corn or whole-wheat flour tortillas or taco shells. Vegetables Lettuce. Spinach. Peas.  Beets. Cauliflower. Cabbage. Broccoli. Carrots. Tomatoes. Squash. Eggplant. Herbs. Peppers. Onions. Cucumbers. Brussels sprouts. Fruits Berries. Bananas. Apples. Oranges. Grapes. Papaya. Mango. Pomegranate. Kiwi. Grapefruit. Cherries. Meats and other protein foods Seafood. Poultry without skin. Lean cuts of pork and beef. Tofu. Eggs. Nuts. Beans. Dairy Low-fat or fat-free dairy products, such as yogurt, cottage cheese, and cheese. Beverages Water. Tea. Coffee. Sugar-free or diet soda. Seltzer water. Lowfat or no-fat milk. Milk alternatives, such as soy or almond milk. Fats and oils Olive oil. Canola oil. Sunflower oil. Grapeseed oil. Avocado. Walnuts. Sweets and desserts Sugar-free or low-fat pudding. Sugar-free or low-fat ice cream and other frozen treats. Seasoning and other foods Herbs. Sodium-free spices. Mustard. Relish. Low-fat, low-sugar ketchup. Low-fat, low-sugar barbecue sauce. Low-fat or fat-free mayonnaise. What foods are not recommended? The items listed below may not be a complete list. Talk with your dietitian about what dietary choices are best for you. Grains Refined white flour and flour products, such as bread, pasta, snack foods, and cereals. Vegetables Canned vegetables. Frozen vegetables with butter or cream sauce. Fruits Fruits canned with syrup. Meats and other protein foods Fatty cuts of meat. Poultry with skin. Breaded or fried meat. Processed meats. Dairy Full-fat yogurt, cheese, or milk. Beverages Sweetened drinks, such as sweet iced tea and soda. Fats and oils Butter. Lard. Ghee. Sweets and desserts Baked goods, such as cake, cupcakes, pastries, cookies, and cheesecake. Seasoning and other foods Spice mixes with added salt. Ketchup. Barbecue sauce. Mayonnaise. Summary  To prevent diabetes from developing, you may need to make diet and other lifestyle changes to help control blood sugar, improve cholesterol levels, and manage your blood  pressure.  Set weight loss goals with the help of your health care team. It is recommended that most people with prediabetes lose 7 percent of their current body weight.  Consider following a Mediterranean diet that includes plenty of fresh fruits and vegetables, whole grains, beans, nuts, seeds, fish, lean meat, low-fat dairy, and healthy oils. This information is not intended to replace advice given to you by your health care provider. Make sure you discuss any questions you have with your health care provider. Document Released: 03/05/2015 Document Revised: 12/23/2016 Document Reviewed: 12/23/2016 Elsevier Interactive Patient Education  2019 [employer]  

## 2018-11-29 LAB — CMP14+EGFR
ALT: 9 IU/L (ref 0–32)
AST: 14 IU/L (ref 0–40)
Albumin/Globulin Ratio: 1.8 (ref 1.2–2.2)
Albumin: 4.4 g/dL (ref 3.7–4.7)
Alkaline Phosphatase: 89 IU/L (ref 39–117)
BUN/Creatinine Ratio: 14 (ref 12–28)
BUN: 14 mg/dL (ref 8–27)
Bilirubin Total: 0.5 mg/dL (ref 0.0–1.2)
CO2: 27 mmol/L (ref 20–29)
Calcium: 9.7 mg/dL (ref 8.7–10.3)
Chloride: 93 mmol/L — ABNORMAL LOW (ref 96–106)
Creatinine, Ser: 0.98 mg/dL (ref 0.57–1.00)
GFR calc Af Amer: 67 mL/min/{1.73_m2} (ref >59)
GFR calc non Af Amer: 58 mL/min/{1.73_m2} — ABNORMAL LOW (ref >59)
Globulin, Total: 2.4 g/dL (ref 1.5–4.5)
Glucose: 101 mg/dL — ABNORMAL HIGH (ref 65–99)
Potassium: 4.2 mmol/L (ref 3.5–5.2)
Sodium: 137 mmol/L (ref 134–144)
Total Protein: 6.8 g/dL (ref 6.0–8.5)

## 2018-11-29 LAB — LIPID PANEL
Chol/HDL Ratio: 4.2 ratio (ref 0.0–4.4)
Cholesterol, Total: 148 mg/dL (ref 100–199)
HDL: 35 mg/dL — ABNORMAL LOW (ref >39)
LDL Calculated: 84 mg/dL (ref 0–99)
Triglycerides: 146 mg/dL (ref 0–149)
VLDL Cholesterol Cal: 29 mg/dL (ref 5–40)

## 2018-11-29 LAB — TSH: TSH: 2.46 u[IU]/mL (ref 0.450–4.500)

## 2018-12-12 ENCOUNTER — Ambulatory Visit (INDEPENDENT_AMBULATORY_CARE_PROVIDER_SITE_OTHER): Payer: Medicare Other | Admitting: *Deleted

## 2018-12-12 DIAGNOSIS — Z7901 Long term (current) use of anticoagulants: Secondary | ICD-10-CM | POA: Diagnosis not present

## 2018-12-12 DIAGNOSIS — I480 Paroxysmal atrial fibrillation: Secondary | ICD-10-CM

## 2018-12-12 LAB — POCT INR: INR: 3 (ref 2.0–3.0)

## 2018-12-12 NOTE — Patient Instructions (Addendum)
  Description   Continue taking 1/2 tablet daily except 1 tablet on Mondays and Fridays.  Repeat INR in 4 weeks

## 2018-12-15 ENCOUNTER — Ambulatory Visit (INDEPENDENT_AMBULATORY_CARE_PROVIDER_SITE_OTHER): Payer: Medicare Other | Admitting: Psychiatry

## 2018-12-15 ENCOUNTER — Encounter (HOSPITAL_COMMUNITY): Payer: Self-pay | Admitting: Psychiatry

## 2018-12-15 DIAGNOSIS — F411 Generalized anxiety disorder: Secondary | ICD-10-CM

## 2018-12-15 DIAGNOSIS — F331 Major depressive disorder, recurrent, moderate: Secondary | ICD-10-CM

## 2018-12-15 MED ORDER — TRAZODONE HCL 100 MG PO TABS: ORAL_TABLET | ORAL | 1 refills | Status: DC

## 2018-12-15 MED ORDER — LAMOTRIGINE 25 MG PO TABS: ORAL_TABLET | ORAL | 1 refills | Status: DC

## 2018-12-15 MED ORDER — DIAZEPAM 2 MG PO TABS: 2.0000 mg | ORAL_TABLET | Freq: Every day | ORAL | 0 refills | Status: DC | PRN

## 2018-12-15 MED ORDER — DESVENLAFAXINE SUCCINATE ER 50 MG PO TB24: 50.0000 mg | ORAL_TABLET | Freq: Every day | ORAL | 2 refills | Status: DC

## 2018-12-15 NOTE — Progress Notes (Signed)
BH MD/PA/NP OP Progress Note  12/15/2018 8:32 AM Laurie Horton  MRN:  [id number]  Chief Complaint:  I am getting upset easily.  HPI: Laurie Horton came for her appointment.  She admitted lately upset easily irritable and having mood swings.  She is upset on her daughter who quit working and now she owe taxes for past 3 years.  Recall last week went to the Dr. Doug Sou office for her INR but she became upset with the front desk when she was waiting for her appointment and did not call earlier.  She also recall having issues with her new primary care physician who did comment on her high hemoglobin A1c and patient did not like it.  She admitted getting frustrated with no reason.  She is taking Valium which calm her down but she does not want to take Valium every day.  Her other daughter is now completely blind.  Not sure if the psychosocial and family issues causing her more irritable but she does not want to be like that.  She is sleeping on and off and admitted sometimes having racing thoughts and negativity about herself.  However she denies any paranoia, hallucination, suicidal thoughts or any homicidal thought.  She admitted sometimes crying spells but denies any panic attack.  Her energy level is fair.  She had a good support from her friends and neighbors.  She is taking trazodone, Pristiq and Valium 2 mg as needed.  She does not want therapy because sometimes she talks to her sister-in-law who lives in [place] when she is under stress.  Patient denies drinking or using any illegal substances.  Visit Diagnosis:    ICD-10-CM   1. MDD (major depressive disorder), recurrent episode, moderate (HCC) F33.1 lamoTRIgine (LAMICTAL) 25 MG tablet    traZODone (DESYREL) 100 MG tablet    diazepam (VALIUM) 2 MG tablet    desvenlafaxine (PRISTIQ) 50 MG 24 hr tablet  2. Generalized anxiety disorder F41.1 diazepam (VALIUM) 2 MG tablet    Past Psychiatric History: Reviewed. H/O depression and anxiety. No H/O  inpatient treatment, suicidal attempt, paranoia, mania and hallucination. Seeing psychiatrist in 2006 after a stroke. Tried Cymbalta, lexaproand Celexa in the past with limited response. Tried Ambien, Lunesta, lexaproand Xanax but developed allergies and side effects. We tried increasing Pristiq but she couldn't afford side effects.  Past Medical History:  Past Medical History:  Diagnosis Date  . CHF (congestive heart failure) ([place]) 03/02/2017   EF 25-30% 2018  . Complication of anesthesia    various issues with oxygen  saturations post op  . COPD (chronic obstructive pulmonary disease) ([place])   . Depression   . Diverticulitis   . DVT (deep venous thrombosis) ([place])   . Fibromyalgia   . GERD (gastroesophageal reflux disease)   . Hiatal hernia   . History of deviated nasal septum    left- side  . HTN (hypertension)   . Hyperlipidemia   . Obesity   . On supplemental oxygen therapy    concentrator at night @ 1.5 l/m or when sleeps. O2 Sat niormally 87.  . Osteoporosis   . PAT (paroxysmal atrial tachycardia) ([place])   . PFO (patent foramen ovale)   . PULMONARY NODULE, LEFT LOWER LOBE 10/14/2009   67mm LLL nodule dec 2010. Stable and 38mm in Oct 2012. No further fu  . PVD (peripheral vascular disease) with claudication ([place]) 12/2017  . Right middle lobe pneumonia ([place]) 07/24/2011   First noted at admit 07/10/11. Persists on cxr 07/22/11.  Cleared on CT 08/24/11. No further followup  . Stroke ([place])   . TOBACCO ABUSE 06/04/2009    Past Surgical History:  Procedure Laterality Date  . CARDIOVASCULAR STRESS TEST  12/26/2004   EF 74%. NO EVIDENCE OF ISCHEMIA  . ESOPHAGOGASTRODUODENOSCOPY (EGD) WITH PROPOFOL N/A 04/15/2015   Procedure: ESOPHAGOGASTRODUODENOSCOPY (EGD) WITH PROPOFOL;  Surgeon: Laurence Spates, MD;  Location: [facility];  Service: Endoscopy;  Laterality: N/A;  . KNEE ARTHROSCOPY  2000   left  . LAPAROSCOPIC CHOLECYSTECTOMY  04-16-2010   [facility]  . LOWER EXTREMITY ANGIOGRAPHY N/A  09/09/2017   Procedure: Lower Extremity Angiography;  Surgeon: Lorretta Harp, MD;  Location: [facility];  Service: Cardiovascular;  Laterality: N/A;  . LOWER EXTREMITY INTERVENTION Left 01/17/2018   Procedure: LOWER EXTREMITY INTERVENTION;  Surgeon: Lorretta Harp, MD;  Location: [facility];  Service: Cardiovascular;  Laterality: Left;  . MOUTH SURGERY     03-26-15 multiple extractions stitches remains  . PERIPHERAL VASCULAR INTERVENTION Left 01/17/2018   Procedure: PERIPHERAL VASCULAR INTERVENTION;  Surgeon: Lorretta Harp, MD;  Location: [facility];  Service: Cardiovascular;  Laterality: Left;  COMMON ILIAC  . RIGHT/LEFT HEART CATH AND CORONARY ANGIOGRAPHY N/A 03/04/2017   Procedure: Right/Left Heart Cath and Coronary Angiography;  Surgeon: Peter M Martinique, MD;  Location: [facility];  Service: Cardiovascular;  Laterality: N/A;  . TOTAL ABDOMINAL HYSTERECTOMY     post op needed oxygen was told "she gave them a scare"  . TUBAL LIGATION    . US ECHOCARDIOGRAPHY  11/20/2009   EF 55-60%    Family Psychiatric History: Viewed.  Family History:  Family History  Problem Relation Age of Onset  . Dementia Mother   . Diabetes Mother   . Alzheimer's disease Mother   . Heart attack Brother 67  . Heart attack Father   . Schizophrenia Sister   . Diabetes Sister   . Tremor Sister     Social History:  Social History   Socioeconomic History  . Marital status: Divorced    Spouse name: Not on file  . Number of children: 2  . Years of education: Not on file  . Highest education level: Not on file  Occupational History  . Occupation: disability  Social Needs  . Financial resource strain: Not on file  . Food insecurity:    Worry: Not on file    Inability: Not on file  . Transportation needs:    Medical: Not on file    Non-medical: Not on file  Tobacco Use  . Smoking status: Former Smoker    Packs/day: 3.00    Years: 30.00    Pack years: 90.00     Types: Cigarettes    Last attempt to quit: 06/02/2010    Years since quitting: 8.5  . Smokeless tobacco: Never Used  . Tobacco comment: QUIT IN 2011  Substance and Sexual Activity  . Alcohol use: No    Alcohol/week: 0.0 standard drinks  . Drug use: No  . Sexual activity: Yes    Partners: Male    Birth control/protection: Post-menopausal, Surgical  Lifestyle  . Physical activity:    Days per week: Not on file    Minutes per session: Not on file  . Stress: Not on file  Relationships  . Social connections:    Talks on phone: Not on file    Gets together: Not on file    Attends religious service: Not on file    Active  member of club or organization: Not on file    Attends meetings of clubs or organizations: Not on file    Relationship status: Not on file  Other Topics Concern  . Not on file  Social History Narrative   Marital status: divorced in 1978 after ten years; dating casually in 2019.      Children: 3 biological children; 3 court appointed children; 6 grandchildren; 5 gg      Lives: alone; children in [place] and [place].      Employment: retired age 55; disability for COPD, CVA at age 83.      Tobacco: former smoker; quit smoking 2011.      Alcohol: none      Exercise: walks dog four times per day; goes to pool three times per week.      ADLs: drives; no assistant devices; does have a walker.  Cleaning is limited in 2018.  Daughter helps with cleaning.  Does own grocery shopping.      Advanced Directives: YES; DNR/DNI; HCPOA: Rolan Lipa Martin/daughter youngest.  Blind.                Allergies:  Allergies  Allergen Reactions  . Alprazolam Anaphylaxis and Other (See Comments)    REACTION: stops breathing  . Bee Venom Anaphylaxis  . Iodine Anaphylaxis, Swelling and Other (See Comments)    REACTION: swelling in throat  . Pseudoephedrine Hcl Er Shortness Of Breath  . Budesonide-Formoterol Fumarate Other (See Comments)    Blisters inside of mouth all over   . Crestor [Rosuvastatin Calcium] Other (See Comments)    Unable to walk  . Esomeprazole Magnesium Other (See Comments)    REACTION: "bouncing off walls"  . Flonase [Fluticasone Propionate] Other (See Comments)    NOSE BLEED  . Loratadine Other (See Comments)    claritin D causes shaking  . Lotrimin [Clotrimazole] Other (See Comments)    Mouth blisters  . Lunesta [Eszopiclone] Other (See Comments)    REACTION: "slept for a week"  . Oxcarbazepine Other (See Comments)    Causes deep sleep and dizziness  . Statins Other (See Comments)    Can't walk, legs won't work   . Zolpidem Tartrate Other (See Comments)    REACTION: "slept for a week"  . Betadine [Povidone Iodine] Other (See Comments)    Breathing problems  . Bevespi Aerosphere [Glycopyrrolate-Formoterol] Other (See Comments)    Pt believes this caused mouth sores and thrush   . Clarithromycin Other (See Comments)    All "mycins", Puts into "a" fib, Will take if has to for severe sinus infection  . Effexor [Venlafaxine] Nausea And Vomiting and Other (See Comments)    cramps  . Lexapro [Escitalopram Oxalate] Other (See Comments)    hallucinations  . Aciphex [Rabeprazole Sodium] Rash  . Alendronate Sodium Other (See Comments)    "caused stomach problems for 3 days"  . Avelox [Moxifloxacin Hcl In Nacl] Other (See Comments)    Stomach cramps.   Darlin Coco [Valdecoxib] Rash  . Ceclor [Cefaclor] Rash  . Cephalexin Rash and Other (See Comments)    Pt states that she is possibly allergic to this - had a reaction to Cefaclor in the past and she does not want to these class drugs. Added per patient request.  . Covera-Hs [Verapamil Hcl] Palpitations  . Dicyclomine Hcl Rash  . Other Other (See Comments)    Glue from ekg/heart monitor leads --rash, Any MYCINS  . Tessalon Perles Rash    Metabolic  Disorder Labs: Lab Results  Component Value Date   HGBA1C 6.4 (A) 11/28/2018   MPG 120 (H) 05/04/2013   No results found for:  PROLACTIN Lab Results  Component Value Date   CHOL 148 11/28/2018   TRIG 146 11/28/2018   HDL 35 (L) 11/28/2018   CHOLHDL 4.2 11/28/2018   VLDL 25 07/02/2016   LDLCALC 84 11/28/2018   LDLCALC 110 (H) 04/13/2018   Lab Results  Component Value Date   TSH 2.460 11/28/2018   TSH 4.560 (H) 01/11/2018    Therapeutic Level Labs: No results found for: LITHIUM No results found for: VALPROATE No components found for:  CBMZ  Current Medications: Current Outpatient Medications  Medication Sig Dispense Refill  . carvedilol (COREG) 12.5 MG tablet TAKE 1 TABLET (12.5 MG TOTAL) BY MOUTH 2 (TWO) TIMES DAILY. 60 tablet 11  . desvenlafaxine (PRISTIQ) 50 MG 24 hr tablet Take 1 tablet (50 mg total) by mouth daily. 30 tablet 2  . diazepam (VALIUM) 2 MG tablet Take 1 tablet (2 mg total) by mouth daily as needed for anxiety. 10 tablet 0  . diclofenac sodium (VOLTAREN) 1 % GEL APPLY 2 GRAMS TO BOTH HANDS 4 TIMES A DAY  5  . diphenhydrAMINE (BENADRYL) 25 mg capsule Take 25 mg by mouth as directed.    . Eyelid Cleansers (OCUSOFT BABY EYELID & EYELASH EX) Apply topically.    . furosemide (LASIX) 40 MG tablet TAKE 1 TABLET BY MOUTH EVERY DAY 30 tablet 11  . gabapentin (NEURONTIN) 100 MG capsule Take 100-200 mg by mouth See admin instructions. 100 mg in am, 200 mg at noon and  200 mg at bedtime    . HYDROcodone-acetaminophen (NORCO/VICODIN) 5-325 MG tablet Take 1 tablet by mouth 3 (three) times daily.     Marland Kitchen ipratropium (ATROVENT) 0.02 % nebulizer solution USE 1 VIAL BY NEBULIZATION 4 (FOUR) TIMES DAILY. 75 mL 3  . losartan (COZAAR) 25 MG tablet TAKE 1 TABLET BY MOUTH EVERY DAY 30 tablet 11  . montelukast (SINGULAIR) 10 MG tablet TAKE 1 TABLET BY MOUTH EVERYDAY AT BEDTIME 90 tablet 3  . nystatin cream (MYCOSTATIN) Apply 1 application topically 2 (two) times daily. 30 g 2  . ondansetron (ZOFRAN ODT) 8 MG disintegrating tablet Take 1 tablet (8 mg total) by mouth every 8 (eight) hours as needed for nausea or  vomiting. 20 tablet 0  . oxybutynin (DITROPAN-XL) 10 MG 24 hr tablet TAKE 10 MG BY MOUTH EVERY DAY 90 tablet 3  . OXYGEN Inhale 1.5-2 L into the lungs as needed (for shortness of breath).    . pantoprazole (PROTONIX) 40 MG tablet Take 40 mg by mouth daily.     . polyethylene glycol (MIRALAX / GLYCOLAX) packet Take 17 g by mouth every other day.     . rosuvastatin (CRESTOR) 5 MG tablet TAKE 1 TABLET BY MOUTH 2 TIMES A WEEK. SUNDAY AND WEDNESDAY 24 tablet 2  . spironolactone (ALDACTONE) 25 MG tablet TAKE 1 TABLET BY MOUTH EVERY DAY 30 tablet 8  . tiZANidine (ZANAFLEX) 2 MG tablet Take 2 mg by mouth at bedtime.     . traZODone (DESYREL) 100 MG tablet Take 1/2 to one tab daily as needed for sleep 30 tablet 1  . warfarin (COUMADIN) 5 MG tablet TAKE 1/2 TO 1 TABLET BY MOUTH EVERY DAY OR AS DIRECTED BY COUMADIN CLINIC 30 tablet 5   No current facility-administered medications for this visit.      Musculoskeletal: Strength & Muscle Tone: within  normal limits Gait & Station: normal Patient leans: N/A  Psychiatric Specialty Exam: ROS  Blood pressure 124/67, pulse 75, height 5\' 4"  (1.626 m), weight 193 lb (87.5 kg).Body mass index is 33.13 kg/m.  General Appearance: Casual  Eye Contact:  Good  Speech:  Slow  Volume:  Normal  Mood:  Depressed and Irritable  Affect:  Constricted and Labile  Thought Process:  Goal Directed  Orientation:  Full (Time, Place, and Person)  Thought Content: Rumination   Suicidal Thoughts:  No  Homicidal Thoughts:  No  Memory:  Immediate;   Good Recent;   Good Remote;   Good  Judgement:  Good  Insight:  Fair  Psychomotor Activity:  Increased  Concentration:  Concentration: Fair and Attention Span: Fair  Recall:  Good  Fund of Knowledge: Good  Language: Good  Akathisia:  No  Handed:  Right  AIMS (if indicated): not done  Assets:  Communication Skills Desire for Improvement Housing Resilience  ADL's:  Intact  Cognition: WNL  Sleep:  Fair    Screenings: PHQ2-9     Office Visit from 11/28/2018 in Primary Care at [facility] from 04/13/2018 in Primary Care at [facility] from 10/20/2017 in Primary Care at [facility] from 04/28/2017 in Primary Care at [facility] from 02/08/2017 in [facility] at [facility] Total Score  0  2  0  0  0  PHQ-9 Total Score  -  5  -  -  -       Assessment and Plan: Major depressive disorder, recurrent.  Generalized anxiety disorder.  Patient noticed increased irritability, frustration and short temper.  She is not sure what causing but believes it is family's.  She does not want to take more Valium than it is prescribed.  I recommended to try Lamictal which she had never tried before.  We discussed allergies as she is allergic to a lot of medication but she did not recall taking this medication in the past.  I recommended to try Lamictal 25 mg daily for 1 week and then twice a day.  I told that if she ever develop a rash with the Lamictal then she need to stop the medication immediately.  I will continue trazodone 100 mg at bedtime, Pristiq 2 mg daily and Valium 2 mg as needed.  One more time I encourage therapy but she declined.  Reassurance given.  Recommended to call us back if she feels worsening of the symptoms or anytime having active suicidal thoughts or homicidal thought.  I will see her again in 2 months.   Kathlee Nations, MD 12/15/2018, 8:32 AM

## 2018-12-19 ENCOUNTER — Telehealth (HOSPITAL_COMMUNITY): Payer: Self-pay

## 2018-12-19 NOTE — Telephone Encounter (Signed)
Patient called to report she stopped the Lamictal - she took it for four days and developed a rash, diarrhea and labored breathing. Patient advised to keep an eye on the rash, if it does not clear up in 24 hours patient was instructed to reach out to primary care. Patient has an appointment with her lung doctor tomorrow.

## 2018-12-20 NOTE — Telephone Encounter (Signed)
Thanks for update.  Yes she need to stop the Lamictal and watch carefully to rash resolve completely.  If not then she need to contact her primary care physician.

## 2018-12-21 ENCOUNTER — Encounter: Payer: Self-pay | Admitting: Internal Medicine

## 2018-12-21 ENCOUNTER — Ambulatory Visit (INDEPENDENT_AMBULATORY_CARE_PROVIDER_SITE_OTHER): Payer: Medicare Other | Admitting: Internal Medicine

## 2018-12-21 VITALS — BP 126/70 | HR 82 | Ht 64.0 in | Wt 191.4 lb

## 2018-12-21 DIAGNOSIS — J449 Chronic obstructive pulmonary disease, unspecified: Secondary | ICD-10-CM | POA: Diagnosis not present

## 2018-12-21 NOTE — Progress Notes (Signed)
OV 05/19/2016  Chief Complaint  Patient presents with  . Follow-up    Pt c/o worsening SOB, prod cough with thick white mucus.      This is a routine follow-up. Last visit was in April 2017 with nurse practitioner. At that time treated for COPD exacerbation according to her history but review of the chart does not show that to be true. At this point in time she says COPD stable although she says she might be in flare up on account of her fibromyalgia. Starting her symptoms out it appears that it is generally stable with dyspnea at baseline and cough with mild sputum at baseline. She is more hobbled by her chronic pain and fibromyalgia. Couple months ago apparently a hydrocodone was discontinued by rheumatology and therefore she is having "withdrawal". She does have a new pain medication physician. She is on gabapentin for fibromyalgia. There are no other new issues. She does not want antibiotics or prednisone for her perceived exacerbation  OV 11/17/2016  Chief Complaint  Patient presents with  . Follow-up    Pt states her SOB has worsened since last OV. Pt states she has a burning in her chest when she becomes SOB. Pt c/o prod cough with white mucus in morning, cough becomes nonprod throughout the day.     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 Last CT chest March 2017 without any mass and associated mild cor pulmonale   Six-month follow-up visit. She is completely overwhelmed by her fibromyalgia and depression. The fibromyalgia is worse. She says she is change doctors because of this. She's had a few admissions in the interim but none of them give a COPD exacerbation according to chart review. She suffered frustrated by her heels oxygen system even though it is light it is causing her pain. She uses Atrovent nebulizer but wants change to something else but at the same time has rejected use of any other  nebulizer or oral inhaler because of side effects. She is burning chest pain with inspiration and associated with her costochondral junction trigger points. 2014 review of the chart shows normal cardiac stress test. November 2017 chest x-ray is clear. She does not want any further imaging. Chest pain is mild to severe and variable. Worsened with inspiration. No radiation associated wheezing. No sputum production  OV 05/25/2017  Chief Complaint  Patient presents with  . Follow-up    Pt states her SOB has worsened since last OV in 11/2016. Pt states she only coughs after her neb treatment - pt states her mucus is yellow in color and c/o occ chest discomfort. Pt denies f/c/s.     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 Last CT chest March 2017 without any mass and associated mild cor pulmonale   Follow-up exertional hypoxemia associated with his emphysema. Also associated with fibromyalgia. Last visit she had atypical chest pain. Recommended she see cardiology. 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 According to her history she has significant coronary artery disease but is fairly advanced. She is frustrated with this but realistic. She feels her days are number. In terms of COPD emphysema and is stable. She is on Atrovent inhalers. She is on oxygen. She wants a lighter system. She still burden by fibromyalgia. She does not want to do  any vaccines anymore including flu shot and the new  shingles vaccine   OV 11/22/2017  Chief Complaint  Patient presents with  . 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  She uses oxygen and nebulizers.  She is not interested in rehab or vaccines.  New issue: Preoperative pulmonary evaluation.  She is having significant bilateral lower extremity claudication.  She states that she is in severe pain walking from her door to the mailbox to the point she is almost crying.  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  Apparently Dr. Trula Slade wants to try a laparotomy approach.  However Dr. Broadus John might take her to the cardiac Cath Lab and placed stents.  She says she is sensitive to fentanyl and is worried about anesthesia complications but the pain is so severe she is willing to take the risk.  She has had previous cardiac catheterization without any problems other than being sensitive to fentanyl.  She says this can be done in the cardiac Cath Lab with anesthesia support.  She wants me to talk to Dr. Betsy Coder and Dr. Trula Slade about this.   OV 07/18/2018  Subjective:  Patient ID: Laurie Horton, female , DOB: 09/04/1946 , age 73 y.o. , MRN: [id number] , ADDRESS: [address] [place] [address]   07/18/2018 -   Chief Complaint  Patient presents with  . Follow-up    Pt states her chest is hurting her all the time now and states she does not think her neb solutions are working for her anymore now. Pt also states she has had some worsening SOB and is also coughing up white phlegm which is comes out in chunks.     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  She uses oxygen and nebulizers. Last CT chest March 2017 without any mass and associated mild cor pulmonale   HPI TEIRRA CARAPIA 73 y.o. -after last visit she  saw nurse practitioner in June 2019 fora mild respiratory flare. At the time treated with allergy medications antihistamines.She tells me thathe last saw me January 2019 she had an iliac stent place in the left side and after that her effort tolerance is improved but in the last few months she's noticed a decrease in effort tolerance with worsening dyspnea and also increased cough and increased sputum production in volume and also consistency without change in color This no fever or weight loss. She will not have a flu shot because of prior allergy. Her last CT scan of the chest was in 2017 and she is requesting for another one. Her inhaler as ipratropium which she says she's not happy with. In the past she's uses Spiriva and Symbicort and these have caused blisters and so she is generally where you have inhalers although she wants something other than her current one.       08/19/2018  - Visit   [name].  Patient was last seen in our office visit on  07/18/2018 started on Bevespi.  Patient reports that after 1 day of use of the Bevespi inhaler she developed thrush and mouth sores.  She she contacted our office to be treated with nystatin.  Patient reports that most mouth sores resolved there remains one.  Patient is also having extensive dental work done.  Patient also has been treated for scabies as well as impetigo by dermatology recently.   Patient completed a high-res CT in 07/29/2018 that showed no real changes from baseline.  Still showing severe emphysema.   Patient reports that she continues to use her Atrovent nebulizer but is wondering if there is any other options available for her.  Patient feels that the Atrovent nebulizer is not working as well.  Patient is currently using as needed and not using it scheduled.  MMRC - Breathlessness Score 3 - I stop for breath after walking about 100 yards or after a few minutes on level ground (isle at grocery  store is 139f)  Patient reports that she has known triggers of shortness of breath with exertion, when there is high pollen counts, when she is walking, when she is outside for extended periods of time.  Patient reports she is been using 2 L via nasal cannula of oxygen with exertion as well as at rest.  Patient reports she forgot her POC at home.  She arrived to our office on room air.  Oxygen saturations 87.  Patient refused oxygen in our office and states that she does not think she needs oxygen right now.  Patient is sad and concerned regarding her limitations with vascular surgery.  Patient believes that she needs a surgery but reports that vascular team as well as cardiology does not think that she would be a good surgical candidate.  She reports that she has not heard back from Dr. BNaida Sleightoffice regarding her most recent test results.   Tests:  01/21/2016-CT chest without contrast- moderate centrilobular emphysema and diffuse bronchial wall thickening mild subpleural density in the dependent lower lobes  01/20/2016-pulmonary function test- airway obstruction and diffusion defect suggesting emphysema  Imaging:  02/11/2017-chest x-ray-stable large cardiac silhouette, lungs are hyperinflated, interstitial edema pattern unchanged from prior scans   Cardiac:  07/08/2017-echocardiogram-LV ejection fraction 50 to 516% grade 1 diastolic dysfunction      OV 12/21/2018  Subjective:  Patient ID: KArmen Horton female , DOB: 209-15-1947, age 73y.o. 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  She uses oxygen and nebulizers. Last CT chest March 2017 without any mass and associated mild cor pulmonale  12/21/2018 -   Chief Complaint  Patient presents with  . Follow-up    Pt states due to being switched to a  new medication, she has had labored breathing. SOB is with exertion, has an occ cough with white phlegm, and also has had some occ CP.     HPI KROSALI [id number].o. -  Presents for routine follow-up.  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  She uses oxygen sporadically and nebulizer sporadically.  She is very afraid of medicines because of multiple allergies.  She says her psychiatrist Dr. AMelissa Montaneplaced her on some medications that caused a rash.  Other than that she is okay.    CAT COPD Symptom &  Quality of Life Score ([place] trademark) 0 is no burden. 5 is highest burden 07/18/2018  12/21/2018   Never Cough -> Cough all the time 3 3  No phlegm in chest -> Chest is full of phlegm 5 3  No chest tightness -> Chest feels very tight 4 3  No dyspnea for 1 flight stairs/hill -> Very dyspneic for 1 flight of stairs 5 3  No limitations for ADL at home -> Very limited with ADL at home 5 4  Confident leaving home -> Not at all confident leaving home 0 1  Sleep soundly -> Do not sleep soundly because of lung condition 3 4  Lots of Energy -> No energy at all 5 4  TOTAL Score (max 40)  30 25   ROS - per HPI     has a past medical history of CHF (congestive heart failure) ([facility]) (74/10/8785), Complication of anesthesia, COPD (chronic obstructive pulmonary disease) ([place]), Depression, Diverticulitis, DVT (deep venous thrombosis) ([facility]), Fibromyalgia, GERD (gastroesophageal reflux disease), Hiatal hernia, History of deviated nasal septum, HTN (hypertension), Hyperlipidemia, Obesity, On supplemental oxygen therapy, Osteoporosis, PAT (paroxysmal atrial tachycardia) ([place]), PFO (patent foramen ovale), PULMONARY NODULE, LEFT LOWER LOBE (10/14/2009), PVD (peripheral vascular disease) with claudication ([place]) (12/2017), Right middle lobe pneumonia ([place]) (07/24/2011), Stroke ([place]), and TOBACCO ABUSE (06/04/2009).   reports that she quit smoking about 8 years ago. Her  smoking use included cigarettes. She has a 90.00 pack-year smoking history. She has never used smokeless tobacco.  Past Surgical History:  Procedure Laterality Date  . CARDIOVASCULAR STRESS TEST  12/26/2004   EF 74%. NO EVIDENCE OF ISCHEMIA  . ESOPHAGOGASTRODUODENOSCOPY (EGD) WITH PROPOFOL N/A 04/15/2015   Procedure: ESOPHAGOGASTRODUODENOSCOPY (EGD) WITH PROPOFOL;  Surgeon: Laurence Spates, MD;  Location: [facility];  Service: Endoscopy;  Laterality: N/A;  . KNEE ARTHROSCOPY  2000   left  . LAPAROSCOPIC CHOLECYSTECTOMY  04-16-2010   [facility]  . LOWER EXTREMITY ANGIOGRAPHY N/A 09/09/2017   Procedure: Lower Extremity Angiography;  Surgeon: Lorretta Harp, MD;  Location: [facility];  Service: Cardiovascular;  Laterality: N/A;  . LOWER EXTREMITY INTERVENTION Left 01/17/2018   Procedure: LOWER EXTREMITY INTERVENTION;  Surgeon: Lorretta Harp, MD;  Location: [facility];  Service: Cardiovascular;  Laterality: Left;  . MOUTH SURGERY     03-26-15 multiple extractions stitches remains  . PERIPHERAL VASCULAR INTERVENTION Left 01/17/2018   Procedure: PERIPHERAL VASCULAR INTERVENTION;  Surgeon: Lorretta Harp, MD;  Location: [facility];  Service: Cardiovascular;  Laterality: Left;  COMMON ILIAC  . RIGHT/LEFT HEART CATH AND CORONARY ANGIOGRAPHY N/A 03/04/2017   Procedure: Right/Left Heart Cath and Coronary Angiography;  Surgeon: Peter M Martinique, MD;  Location: [facility];  Service: Cardiovascular;  Laterality: N/A;  . TOTAL ABDOMINAL HYSTERECTOMY     post op needed oxygen was told "she gave them a scare"  . TUBAL LIGATION    . US ECHOCARDIOGRAPHY  11/20/2009   EF 55-60%    Allergies  Allergen Reactions  . Alprazolam Anaphylaxis and Other (See Comments)    REACTION: stops breathing  . Bee Venom Anaphylaxis  . Iodine Anaphylaxis, Swelling and Other (See Comments)    REACTION: swelling in throat  . Pseudoephedrine Hcl Er Shortness Of Breath  . Budesonide-Formoterol  Fumarate Other (See Comments)    Blisters inside of mouth all over  . Crestor [Rosuvastatin Calcium] Other (See Comments)    Unable to walk  . Esomeprazole Magnesium Other (See Comments)    REACTION: "  bouncing off walls"  . Flonase [Fluticasone Propionate] Other (See Comments)    NOSE BLEED  . Lamictal [Lamotrigine] Rash    Patient got rash, labored breathing, and diarrhea  . Loratadine Other (See Comments)    claritin D causes shaking  . Lotrimin [Clotrimazole] Other (See Comments)    Mouth blisters  . Lunesta [Eszopiclone] Other (See Comments)    REACTION: "slept for a week"  . Oxcarbazepine Other (See Comments)    Causes deep sleep and dizziness  . Statins Other (See Comments)    Can't walk, legs won't work   . Zolpidem Tartrate Other (See Comments)    REACTION: "slept for a week"  . Betadine [Povidone Iodine] Other (See Comments)    Breathing problems  . Bevespi Aerosphere [Glycopyrrolate-Formoterol] Other (See Comments)    Pt believes this caused mouth sores and thrush   . Clarithromycin Other (See Comments)    All "mycins", Puts into "a" fib, Will take if has to for severe sinus infection  . Effexor [Venlafaxine] Nausea And Vomiting and Other (See Comments)    cramps  . Lexapro [Escitalopram Oxalate] Other (See Comments)    hallucinations  . Aciphex [Rabeprazole Sodium] Rash  . Alendronate Sodium Other (See Comments)    "caused stomach problems for 3 days"  . Avelox [Moxifloxacin Hcl In Nacl] Other (See Comments)    Stomach cramps.   Darlin Coco [Valdecoxib] Rash  . Ceclor [Cefaclor] Rash  . Cephalexin Rash and Other (See Comments)    Pt states that she is possibly allergic to this - had a reaction to Cefaclor in the past and she does not want to these class drugs. Added per patient request.  . Covera-Hs [Verapamil Hcl] Palpitations  . Dicyclomine Hcl Rash  . Other Other (See Comments)    Glue from ekg/heart monitor leads --rash, Any MYCINS  . Tessalon Perles Rash     Immunization History  Administered Date(s) Administered  . Influenza Split 08/03/2011, 08/01/2012  . Influenza,inj,Quad PF,6+ Mos 08/14/2013, 09/03/2014, 09/04/2015, 08/18/2016  . Pneumococcal Conjugate-13 10/01/2014  . Pneumococcal Polysaccharide-23 09/03/1999, 06/02/2006, 08/14/2013  . Td 11/02/1994  . Tdap 08/18/2016    Family History  Problem Relation Age of Onset  . Dementia Mother   . Diabetes Mother   . Alzheimer's disease Mother   . Heart attack Brother 52  . Heart attack Father   . Schizophrenia Sister   . Diabetes Sister   . Tremor Sister      Current Outpatient Medications:  .  carvedilol (COREG) 12.5 MG tablet, TAKE 1 TABLET (12.5 MG TOTAL) BY MOUTH 2 (TWO) TIMES DAILY., Disp: 60 tablet, Rfl: 11 .  desvenlafaxine (PRISTIQ) 50 MG 24 hr tablet, Take 1 tablet (50 mg total) by mouth daily., Disp: 30 tablet, Rfl: 2 .  diazepam (VALIUM) 2 MG tablet, Take 1 tablet (2 mg total) by mouth daily as needed for anxiety., Disp: 10 tablet, Rfl: 0 .  diclofenac sodium (VOLTAREN) 1 % GEL, APPLY 2 GRAMS TO BOTH HANDS 4 TIMES A DAY, Disp: , Rfl: 5 .  Eyelid Cleansers (OCUSOFT BABY EYELID & EYELASH EX), Apply topically., Disp: , Rfl:  .  furosemide (LASIX) 40 MG tablet, TAKE 1 TABLET BY MOUTH EVERY DAY, Disp: 30 tablet, Rfl: 11 .  gabapentin (NEURONTIN) 100 MG capsule, Take 100-200 mg by mouth See admin instructions. 100 mg in am, 200 mg at noon and  200 mg at bedtime, Disp: , Rfl:  .  HYDROcodone-acetaminophen (NORCO/VICODIN) 5-325 MG tablet, Take 1  tablet by mouth 3 (three) times daily. , Disp: , Rfl:  .  ipratropium (ATROVENT) 0.02 % nebulizer solution, USE 1 VIAL BY NEBULIZATION 4 (FOUR) TIMES DAILY., Disp: 75 mL, Rfl: 3 .  losartan (COZAAR) 25 MG tablet, TAKE 1 TABLET BY MOUTH EVERY DAY, Disp: 30 tablet, Rfl: 11 .  montelukast (SINGULAIR) 10 MG tablet, TAKE 1 TABLET BY MOUTH EVERYDAY AT BEDTIME, Disp: 90 tablet, Rfl: 3 .  oxybutynin (DITROPAN-XL) 10 MG 24 hr tablet, TAKE 10 MG  BY MOUTH EVERY DAY, Disp: 90 tablet, Rfl: 3 .  OXYGEN, Inhale 1.5-2 L into the lungs as needed (for shortness of breath)., Disp: , Rfl:  .  pantoprazole (PROTONIX) 40 MG tablet, Take 40 mg by mouth daily. , Disp: , Rfl:  .  polyethylene glycol (MIRALAX / GLYCOLAX) packet, Take 17 g by mouth every other day. , Disp: , Rfl:  .  rosuvastatin (CRESTOR) 5 MG tablet, TAKE 1 TABLET BY MOUTH 2 TIMES A WEEK. SUNDAY AND WEDNESDAY, Disp: 24 tablet, Rfl: 2 .  spironolactone (ALDACTONE) 25 MG tablet, TAKE 1 TABLET BY MOUTH EVERY DAY, Disp: 30 tablet, Rfl: 8 .  tiZANidine (ZANAFLEX) 2 MG tablet, Take 2 mg by mouth at bedtime. , Disp: , Rfl:  .  traZODone (DESYREL) 100 MG tablet, Take 1/2 to one tab daily as needed for sleep, Disp: 30 tablet, Rfl: 1 .  warfarin (COUMADIN) 5 MG tablet, TAKE 1/2 TO 1 TABLET BY MOUTH EVERY DAY OR AS DIRECTED BY COUMADIN CLINIC, Disp: 30 tablet, Rfl: 5 .  diphenhydrAMINE (BENADRYL) 25 mg capsule, Take 25 mg by mouth as directed., Disp: , Rfl:  .  nystatin cream (MYCOSTATIN), Apply 1 application topically 2 (two) times daily. (Patient not taking: Reported on 12/21/2018), Disp: 30 g, Rfl: 2 .  ondansetron (ZOFRAN ODT) 8 MG disintegrating tablet, Take 1 tablet (8 mg total) by mouth every 8 (eight) hours as needed for nausea or vomiting. (Patient not taking: Reported on 12/21/2018), Disp: 20 tablet, Rfl: 0      Objective:   Vitals:   12/21/18 0910  BP: 126/70  Pulse: 82  SpO2: 91%  Weight: 191 lb 6.4 oz (86.8 kg)  Height: '5\' 4"'$  (1.626 m)    Estimated body mass index is 32.85 kg/m as calculated from the following:   Height as of this encounter: '5\' 4"'$  (1.626 m).   Weight as of this encounter: 191 lb 6.4 oz (86.8 kg).  '@WEIGHTCHANGE'$ @  [employer]   12/21/18 0910  Weight: 191 lb 6.4 oz (86.8 kg)     Physical Exam  General Appearance:    Alert, cooperative, no distress, appears stated age - yes , Deconditioned looking - yes , OBESE  - yes, Sitting on Wheelchair -  no   Head:    Normocephalic, without obvious abnormality, atraumatic  Eyes:    PERRL, conjunctiva/corneas clear,  Ears:    Normal TM's and external ear canals, both ears  Nose:   Nares normal, septum midline, mucosa normal, no drainage    or sinus tenderness. OXYGEN ON  - no . Patient is @ ra   Throat:   Lips, mucosa, and tongue normal; teeth and gums normal. Cyanosis on lips - no  Neck:   Supple, symmetrical, trachea midline, no adenopathy;    thyroid:  no enlargement/tenderness/nodules; no carotid   bruit or JVD  Back:     Symmetric, no curvature, ROM normal, no CVA tenderness  Lungs:     Distress - no ,  Wheeze no, Barrell Chest - no, Purse lip breathing - no, Crackles - no   Chest Wall:    No tenderness or deformity.    Heart:    Regular rate and rhythm, S1 and S2 normal, no rub   or gallop, Murmur - no  Breast Exam:    NOT DONE  Abdomen:     Soft, non-tender, bowel sounds active all four quadrants,    no masses, no organomegaly. Visceral obesity - yes  Genitalia:   NOT DONE  Rectal:   NOT DONE  Extremities:   Extremities - normal, Has Cane - no, Clubbing - no, Edema - no  Pulses:   2+ and symmetric all extremities  Skin:   Stigmata of Connective Tissue Disease - no  Lymph nodes:   Cervical, supraclavicular, and axillary nodes normal  Psychiatric:  Neurologic:   Pleasant - yes, Anxious - no, Flat affect - yes  CAm-ICU - neg, Alert and Oriented x 3 - yes, Moves all 4s - yes, Speech - normal, Cognition - intact           Assessment:       ICD-10-CM   1. COPD, severe ([place]) J44.9        Plan:     Patient Instructions     ICD-10-CM   1. COPD, severe ([place]) J44.9     Stable disease without flare up No cancer in sept 2019 CT No fibrosis in sept 2019 CT  Plan Respect inability to take flu shot Continue your nebulizer and o2 use  Followup 6 months of sooner if needed      SIGNATURE    Dr. Brand Males, M.D., F.C.C.P,  Pulmonary and Critical Care  Medicine Staff Physician, [facility] Director - Interstitial Lung Disease  Program  Pulmonary [facility] [place], [place], [address]  Pager: [phone], If no answer or between  15:00h - 7:00h: call [phone] Telephone: [phone]  9:48 AM 12/21/2018

## 2018-12-21 NOTE — Patient Instructions (Signed)
ICD-10-CM   1. COPD, severe ([place]) J44.9     Stable disease without flare up No cancer in sept 2019 CT No fibrosis in sept 2019 CT  Plan Respect inability to take flu shot Continue your nebulizer and o2 use  Followup 6 months of sooner if needed

## 2018-12-28 NOTE — Progress Notes (Signed)
Cardiology Office Note    Date:  01/02/2019   ID:  Arilyn, Laurie Horton 06, 1947, MRN [id number]  PCP:  Laurie Lipps, MD  Cardiologist: Dr. [place]   Chief Complaint  Patient presents with  . Coronary Artery Disease  . Atrial Fibrillation    History of Present Illness:    Laurie Horton is a 73 y.o. female with past medical history of chronic diastolic CHF, PFO, PAF (on Coumadin), COPD (on 2L [place] at baseline), HTN, and prior CVA who is seen for follow up CHF.  She was admitted from 4/9 - 02/12/2017 for worsening dyspnea on exertion and palpitations. Was in atrial fibrillation with RVR upon arrival to the ED. Echo during admission showed a newly reduced EF of 20-25% and she was diuresed with IV Lasix. Enzymes were negative and EKG showed no acute ischemic changes. It was recommended to consider a right/left heart cath in 4-6 weeks.   She did undergo right and left heart cath on 03/04/17. This showed severe 2 vessel obstructive CAD with 100% RCA occlusion, 75% OM1, and 90% small OM2. EF 25-30%. Mild pulmonary HTN with normal LV filling pressures. 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 Maximizing CHF therapy recommended.  She did have repeat Echo in September 2018 showing improvement in EF to 50-55%.   Subsequent to this she developed significant claudication. She was seen by Dr. Allyson Horton and had angiography showing 80% infrarenal aortic stenosis and left iliac stenosis. She also had severe right common femoral stenosis. She was seen by Dr Laurie Horton for consideration of Aortobifemoral bypass. After pulmonary evaluation she was felt to be too high a risk for open surgery. In March 2019 she underwent atherectomy and covered stenting of the left iliac by Dr. Allyson Horton. On follow up she did have improvement in her claudication and ABIs. The aortic and right common femoral artery stenoses are not felt to be amenable to percutaneous therapy.   In early July she was seen because she felt she was in  Afib following dental procedure. On arrival she was in NSR with PACs. No medical changes made.   She was seen by Dr Laurie Horton in November. Noted return of claudication symptoms but dopplers showed continued patency of stent. She is followed by pulmonary for COPD.   On follow up today she states she has had fluttering in her chest since yesterday. No dizziness or increased SOB. No edema. Weight is stable. Sometimes her chest hurts but no with exertion. She walks her dog for 20 minutes 3x/day.  She has claudication in right leg that is unchanged. She is tolerating Crestor 2x/week.     Past Medical History:  Diagnosis Date  . CHF (congestive heart failure) (HCC) 03/02/2017   EF 25-30% 2018  . Complication of anesthesia    various issues with oxygen  saturations post op  . COPD (chronic obstructive pulmonary disease) (HCC)   . Depression   . Diverticulitis   . DVT (deep venous thrombosis) (HCC)   . Fibromyalgia   . GERD (gastroesophageal reflux disease)   . Hiatal hernia   . History of deviated nasal septum    left- side  . HTN (hypertension)   . Hyperlipidemia   . Obesity   . On supplemental oxygen therapy    concentrator at night @ 1.5 l/m or when sleeps. O2 Sat niormally 87.  . Osteoporosis   . PAT (paroxysmal atrial tachycardia) (HCC)   . PFO (patent foramen ovale)   . PULMONARY NODULE,  LEFT LOWER LOBE 10/14/2009   5mm LLL nodule dec 2010. Stable and 4mm in Oct 2012. No further fu  . PVD (peripheral vascular disease) with claudication (HCC) 12/2017  . Right middle lobe pneumonia (HCC) 07/24/2011   First noted at admit 07/10/11. Persists on cxr 07/22/11. Cleared on CT 08/24/11. No further followup  . Stroke (HCC)   . TOBACCO ABUSE 06/04/2009    Past Surgical History:  Procedure Laterality Date  . CARDIOVASCULAR STRESS TEST  12/26/2004   EF 74%. NO EVIDENCE OF ISCHEMIA  . ESOPHAGOGASTRODUODENOSCOPY (EGD) WITH PROPOFOL N/A 04/15/2015   Procedure: ESOPHAGOGASTRODUODENOSCOPY (EGD) WITH  PROPOFOL;  Surgeon: Laurie Ching, MD;  Location: [facility];  Service: Endoscopy;  Laterality: N/A;  . KNEE ARTHROSCOPY  2000   left  . LAPAROSCOPIC CHOLECYSTECTOMY  04-16-2010   [place]  . LOWER EXTREMITY ANGIOGRAPHY N/A 09/09/2017   Procedure: Lower Extremity Angiography;  Surgeon: Laurie Gess, MD;  Location: [facility] INVASIVE CV LAB;  Service: Cardiovascular;  Laterality: N/A;  . LOWER EXTREMITY INTERVENTION Left 01/17/2018   Procedure: LOWER EXTREMITY INTERVENTION;  Surgeon: Laurie Gess, MD;  Location: [facility] INVASIVE CV LAB;  Service: Cardiovascular;  Laterality: Left;  . MOUTH SURGERY     03-26-15 multiple extractions stitches remains  . PERIPHERAL VASCULAR INTERVENTION Left 01/17/2018   Procedure: PERIPHERAL VASCULAR INTERVENTION;  Surgeon: Laurie Gess, MD;  Location: [facility] INVASIVE CV LAB;  Service: Cardiovascular;  Laterality: Left;  COMMON ILIAC  . RIGHT/LEFT HEART CATH AND CORONARY ANGIOGRAPHY N/A 03/04/2017   Procedure: Right/Left Heart Cath and Coronary Angiography;  Surgeon: Laurie Horton M Swaziland, MD;  Location: [facility] INVASIVE CV LAB;  Service: Cardiovascular;  Laterality: N/A;  . TOTAL ABDOMINAL HYSTERECTOMY     post op needed oxygen was told "she gave them a scare"  . TUBAL LIGATION    . US ECHOCARDIOGRAPHY  11/20/2009   EF 55-60%    Current Medications: Outpatient Medications Prior to Visit  Medication Sig Dispense Refill  . carvedilol (COREG) 12.5 MG tablet TAKE 1 TABLET (12.5 MG TOTAL) BY MOUTH 2 (TWO) TIMES DAILY. 60 tablet 11  . desvenlafaxine (PRISTIQ) 50 MG 24 hr tablet Take 1 tablet (50 mg total) by mouth daily. 30 tablet 2  . diazepam (VALIUM) 2 MG tablet Take 1 tablet (2 mg total) by mouth daily as needed for anxiety. 10 tablet 0  . diclofenac sodium (VOLTAREN) 1 % GEL APPLY 2 GRAMS TO BOTH HANDS 4 TIMES A DAY  5  . diphenhydrAMINE (BENADRYL) 25 mg capsule Take 25 mg by mouth as directed.    . Eyelid Cleansers (OCUSOFT BABY EYELID & EYELASH EX) Apply topically.    .  furosemide (LASIX) 40 MG tablet TAKE 1 TABLET BY MOUTH EVERY DAY 30 tablet 11  . gabapentin (NEURONTIN) 100 MG capsule Take 100-200 mg by mouth See admin instructions. 100 mg in am, 200 mg at noon and  200 mg at bedtime    . HYDROcodone-acetaminophen (NORCO/VICODIN) 5-325 MG tablet Take 1 tablet by mouth 3 (three) times daily.     Marland Kitchen ipratropium (ATROVENT) 0.02 % nebulizer solution USE 1 VIAL BY NEBULIZATION 4 (FOUR) TIMES DAILY. 75 mL 3  . losartan (COZAAR) 25 MG tablet TAKE 1 TABLET BY MOUTH EVERY DAY 30 tablet 11  . montelukast (SINGULAIR) 10 MG tablet TAKE 1 TABLET BY MOUTH EVERYDAY AT BEDTIME 90 tablet 3  . nystatin cream (MYCOSTATIN) Apply 1 application topically 2 (two) times daily. 30 g 2  . ondansetron (ZOFRAN ODT) 8 MG disintegrating  tablet Take 1 tablet (8 mg total) by mouth every 8 (eight) hours as needed for nausea or vomiting. 20 tablet 0  . oxybutynin (DITROPAN-XL) 10 MG 24 hr tablet TAKE 10 MG BY MOUTH EVERY DAY 90 tablet 3  . OXYGEN Inhale 1.5-2 L into the lungs as needed (for shortness of breath).    . pantoprazole (PROTONIX) 40 MG tablet Take 40 mg by mouth daily.     . polyethylene glycol (MIRALAX / GLYCOLAX) packet Take 17 g by mouth every other day.     . rosuvastatin (CRESTOR) 5 MG tablet TAKE 1 TABLET BY MOUTH 2 TIMES A WEEK. SUNDAY AND WEDNESDAY 24 tablet 2  . spironolactone (ALDACTONE) 25 MG tablet TAKE 1 TABLET BY MOUTH EVERY DAY 30 tablet 8  . tiZANidine (ZANAFLEX) 2 MG tablet Take 2 mg by mouth 3 (three) times daily. TAKE 1/2 TABLET IN THE AM, TAKE A 1/2 TABLET IN THE AFTERNOON TAKE A WHOLE TABLET AT BEDTIME    . traZODone (DESYREL) 100 MG tablet Take 1/2 to one tab daily as needed for sleep 30 tablet 1  . warfarin (COUMADIN) 5 MG tablet TAKE 1/2 TO 1 TABLET BY MOUTH EVERY DAY OR AS DIRECTED BY COUMADIN CLINIC 30 tablet 5  . tiZANidine (ZANAFLEX) 2 MG tablet Take 2 mg by mouth at bedtime.      No facility-administered medications prior to visit.      Allergies:    Alprazolam; Bee venom; Iodine; Pseudoephedrine hcl er; Budesonide-formoterol fumarate; Crestor [rosuvastatin calcium]; Esomeprazole magnesium; Flonase [fluticasone propionate]; Lamictal [lamotrigine]; Loratadine; Lotrimin [clotrimazole]; Lunesta [eszopiclone]; Oxcarbazepine; Statins; Zolpidem tartrate; Betadine [povidone iodine]; Bevespi aerosphere [glycopyrrolate-formoterol]; Clarithromycin; Effexor [venlafaxine]; Lexapro [escitalopram oxalate]; Aciphex [rabeprazole sodium]; Alendronate sodium; Avelox [moxifloxacin hcl in nacl]; Bextra [valdecoxib]; Ceclor [cefaclor]; Cephalexin; Covera-hs [verapamil hcl]; Dicyclomine hcl; Other; and Tessalon perles   Social History   Socioeconomic History  . Marital status: Divorced    Spouse name: Not on file  . Number of children: 2  . Years of education: Not on file  . Highest education level: Not on file  Occupational History  . Occupation: disability  Social Needs  . Financial resource strain: Not on file  . Food insecurity:    Worry: Not on file    Inability: Not on file  . Transportation needs:    Medical: Not on file    Non-medical: Not on file  Tobacco Use  . Smoking status: Former Smoker    Packs/day: 3.00    Years: 30.00    Pack years: 90.00    Types: Cigarettes    Last attempt to quit: 06/02/2010    Years since quitting: 8.5  . Smokeless tobacco: Never Used  . Tobacco comment: QUIT IN 2011  Substance and Sexual Activity  . Alcohol use: No    Alcohol/week: 0.0 standard drinks  . Drug use: No  . Sexual activity: Yes    Partners: Male    Birth control/protection: Post-menopausal, Surgical  Lifestyle  . Physical activity:    Days per week: Not on file    Minutes per session: Not on file  . Stress: Not on file  Relationships  . Social connections:    Talks on phone: Not on file    Gets together: Not on file    Attends religious service: Not on file    Active member of club or organization: Not on file    Attends meetings of  clubs or organizations: Not on file    Relationship status: Not on  file  Other Topics Concern  . Not on file  Social History Narrative   Marital status: divorced in 1978 after ten years; dating casually in 2019.      Children: 3 biological children; 3 court appointed children; 6 grandchildren; 5 gg      Lives: alone; children in [place].      Employment: retired age 37; disability for COPD, CVA at age 67.      Tobacco: former smoker; quit smoking 2011.      Alcohol: none      Exercise: walks dog four times per day; goes to pool three times per week.      ADLs: drives; no assistant devices; does have a walker.  Cleaning is limited in 2018.  Daughter helps with cleaning.  Does own grocery shopping.      Advanced Directives: YES; DNR/DNI; HCPOA: [name] Martin/daughter youngest.  Blind.                 Family History:  The patient's family history includes Alzheimer's disease in her mother; Dementia in her mother; Diabetes in her mother and sister; Heart attack in her father; Heart attack (age of onset: 51) in her brother; Schizophrenia in her sister; Tremor in her sister.   Review of Systems:   As noted in HPI.  All other systems reviewed and are otherwise negative except as noted above.   Physical Exam:    VS:  BP 110/70   Pulse 66   Ht 5\' 4"  (1.626 m)   Wt 192 lb 9.6 oz (87.4 kg)   SpO2 (!) 87%   BMI 33.06 kg/m    GENERAL:  Well appearing overweight WF in NAD HEENT:  PERRL, EOMI, sclera are clear. Oropharynx is clear. NECK:  No jugular venous distention, carotid upstroke brisk and symmetric, no bruits, no thyromegaly or adenopathy LUNGS:  Clear to auscultation bilaterally CHEST:  Unremarkable HEART:  RRR,  PMI not displaced or sustained,S1 and S2 within normal limits, no S3, no S4: no clicks, no rubs, no murmurs ABD:  Soft, nontender. BS +, no masses or bruits. No hepatomegaly, no splenomegaly EXT:  Poor pedal pulses no edema, no cyanosis no  clubbing SKIN:  Warm and dry.  No rashes NEURO:  Alert and oriented x 3. Cranial nerves II through XII intact. PSYCH:  Cognitively intact    Wt Readings from Last 3 Encounters:  01/02/19 192 lb 9.6 oz (87.4 kg)  12/21/18 191 lb 6.4 oz (86.8 kg)  11/28/18 193 lb (87.5 kg)     Studies/Labs Reviewed:   EKG:  EKG is  ordered today. NSR with PACs. T wave abnormality c/w anterolateral ischemia. No change. I have personally reviewed and interpreted this study.   Recent Labs: 05/03/2018: Magnesium 2.0 08/29/2018: Hemoglobin 12.1; Platelets 244 11/28/2018: ALT 9; BUN 14; Creatinine, Ser 0.98; Potassium 4.2; Sodium 137; TSH 2.460   Lipid Panel    Component Value Date/Time   CHOL 148 11/28/2018 0943   TRIG 146 11/28/2018 0943   HDL 35 (L) 11/28/2018 0943   CHOLHDL 4.2 11/28/2018 0943   CHOLHDL 4.6 07/02/2016 0823   VLDL 25 07/02/2016 0823   LDLCALC 84 11/28/2018 0943    Additional studies/ records that were reviewed today include:   Echocardiogram: 02/09/2017 Study Conclusions  - Left ventricle: The cavity size was mildly dilated. Wall   thickness was increased in a pattern of mild LVH. Systolic   function was severely reduced. The estimated ejection fraction  was in the range of 20% to 25%. Diffuse hypokinesis. Doppler   parameters are consistent with abnormal left ventricular   relaxation (grade 1 diastolic dysfunction). - Aortic valve: Valve area (Vmax): 1.4 cm^2. - Aortic root: The aortic root was mildly dilated. - Ascending aorta: The ascending aorta was mildly dilated. - Mitral valve: Calcified annulus. There was moderate   regurgitation. - Left atrium: The atrium was moderately dilated. - Right ventricle: Systolic function was moderately reduced. - Pulmonary arteries: Systolic pressure was mildly increased. - Pericardium, extracardiac: A trivial pericardial effusion was   identified.  Impressions:  - No subcostal views; severe global reduction in LV systolic    function; grade 1 diastolic dysfunction; mildly dilated aortic   root and ascending aorta; moderate MR; moderaet LAE; moderately   reduced RV function; mild TR; mildly elevated pulmonary pressure.  Procedures   Right/Left Heart Cath and Coronary Angiography  Conclusion     Ost 1st Mrg to 1st Mrg lesion, 75 %stenosed.  2nd Mrg lesion, 90 %stenosed.  Ost RCA to Mid RCA lesion, 100 %stenosed.  There is severe left ventricular systolic dysfunction.  LV end diastolic pressure is normal.  The left ventricular ejection fraction is 25-35% by visual estimate.  Hemodynamic findings consistent with mild pulmonary hypertension.  LV end diastolic pressure is normal.   1. Severe 2 vessel obstructive CAD    - 75% proximal OM1    - 90% small OM2    - 100% proximal RCA. Left to right collaterals.  2. Severe LV dysfunction 3. Mild pulmonary HTN with normal LV filling pressures.  4. Cardiac index 2.41 L/min/BSA   Plan: Medical management to try and optimize CHF therapy. Patient appears to be adequately diuresed at this time. Based on these results her cardiomyopathy is ischemic. I would treat her CAD medically. If cardiac cath is needed in the future would consider alternative access given difficulty from the right radial approach. Her rhythm during procedure is a multifocal atrial rhythm/tachycardia.     Echo 07/08/17: Study Conclusions  - Left ventricle: The cavity size was normal. There was moderate   concentric hypertrophy. Systolic function was normal. The   estimated ejection fraction was in the range of 50% to 55%.   Severe hypokinesis of the basal-midinferior myocardium;   consistent with infarction in the distribution of the right   coronary artery. Doppler parameters are consistent with abnormal   left ventricular relaxation (grade 1 diastolic dysfunction). - Mitral valve: Calcified annulus.  Impressions:  - Compared to April 2018 there is marked improvement in  contraction   of all LV wall segemnts except for the inferior wall, which   remains severely hypokinetic. There is also marked reduction in   the severity of mitral insufficiency.   Assessment:    1. Coronary artery disease of native artery of native heart with stable angina pectoris (HCC)   2. PAF (paroxysmal atrial fibrillation) (HCC)   3. PAD (peripheral artery disease) (HCC)   4. Essential hypertension      Plan:   In order of problems listed above:  1. Chronic Combined Systolic and Diastolic CHF/ New Cardiomyopathy - EF 25-30% with ischemic cardiomyopathy. Not a candidate for revascularization. With medical management EF improved to 50-55% by Echo.  - she is euvolemic at this time. - continue BB, ARB, aldactone, and statin. Will continue current therapy.   2. Paroxysmal Atrial Fibrillation/ multifocal atrial tachycardia - This patients CHA2DS2-VASc Score and unadjusted Ischemic Stroke Rate (% per year) is  equal to 9.7 % stroke rate/year from a score of 6 (CHF, HTN, Female, Age, CVA (2)). She denies any evidence of active bleeding. Continue Coumadin for anticoagulation. Check INR today. Is in NSR today with PACs today. May be having some Afib but not sustained.   3. HTN- controlled.   4. COPD- - followed by Pulmonology.   5. PFO - no plans for closure   6. PAD s/p covered stenting of left iliac. Stenosis in distal aorta and right femoral not amenable to percutaneous therapy. Still has right leg claudication but significant improvement on left and improved quality of life.   7. CAD. 100% RCA. 75% OM. Chronic stable angina class 1. Continue medical management.     I will follow up in 6 months.   Signed, Amoni Morales Swaziland, MD  01/02/2019 8:35 AM    [facility] [address], [address] [place], [place] [address] Phone: [phone]

## 2019-01-02 ENCOUNTER — Ambulatory Visit (INDEPENDENT_AMBULATORY_CARE_PROVIDER_SITE_OTHER): Payer: Medicare Other | Admitting: Cardiology

## 2019-01-02 ENCOUNTER — Ambulatory Visit (INDEPENDENT_AMBULATORY_CARE_PROVIDER_SITE_OTHER): Payer: Medicare Other | Admitting: *Deleted

## 2019-01-02 ENCOUNTER — Encounter: Payer: Self-pay | Admitting: Cardiology

## 2019-01-02 VITALS — BP 110/70 | HR 66 | Ht 64.0 in | Wt 192.6 lb

## 2019-01-02 DIAGNOSIS — I739 Peripheral vascular disease, unspecified: Secondary | ICD-10-CM

## 2019-01-02 DIAGNOSIS — Z7901 Long term (current) use of anticoagulants: Secondary | ICD-10-CM | POA: Diagnosis not present

## 2019-01-02 DIAGNOSIS — I480 Paroxysmal atrial fibrillation: Secondary | ICD-10-CM | POA: Diagnosis not present

## 2019-01-02 DIAGNOSIS — I10 Essential (primary) hypertension: Secondary | ICD-10-CM

## 2019-01-02 DIAGNOSIS — I25118 Atherosclerotic heart disease of native coronary artery with other forms of angina pectoris: Secondary | ICD-10-CM

## 2019-01-02 LAB — POCT INR: INR: 2.7 (ref 2.0–3.0)

## 2019-01-02 NOTE — Patient Instructions (Signed)
Description   Continue taking 1/2 tablet daily except 1 tablet on Mondays and Fridays.  Repeat INR in 4 weeks

## 2019-01-12 ENCOUNTER — Telehealth: Payer: Self-pay

## 2019-01-12 NOTE — Telephone Encounter (Signed)
We can switch to irbesartan 75 mg daily  Laurie Horton [place] MD, [facility]

## 2019-01-12 NOTE — Telephone Encounter (Signed)
Todd with [facility] called to report that the pt cannot get refills due to back order on her Losartan 25mg  a day...  He has...   Irbesartan 75mg  and 300mg ,  Olmasartan, Telemisartan 40mg  and 80mg     In stock if she could switch to one of them..  Will forward to Dr. Martinique for his advice and recommendation.   [facility] [phone].

## 2019-01-13 MED ORDER — IRBESARTAN 75 MG PO TABS: 75.0000 mg | ORAL_TABLET | Freq: Every day | ORAL | 3 refills | Status: DC

## 2019-01-13 NOTE — Telephone Encounter (Signed)
Spoke to  Pharmacist - prescription changes due to back order   notified patient of the change as well,patient verbalize understanding

## 2019-02-01 ENCOUNTER — Telehealth: Payer: Self-pay

## 2019-02-01 NOTE — Telephone Encounter (Signed)
While on the telephone with the patient she stated that since her last visit with Dr. Gwenlyn Found left arm can not get a BP and she has numbness in that arm.   Patient does not want to do a tele-health she just wants reschedule for a later date

## 2019-02-01 NOTE — Telephone Encounter (Signed)
   Cardiac Questionnaire: °  ° Since your last visit or hospitalization: ° °  1. Have you been having new or worsening chest pain? No °  2. Have you been having new or worsening shortness of breath? No °3. Have you been having new or worsening leg swelling, wt gain, or increase in abdominal girth (pants fitting more tightly)? No °  4. Have you had any passing out spells? No °   °*A YES to any of these questions would result in the appointment being kept. °*If all the answers to these questions are NO, we should indicate that given the current situation regarding the worldwide coronarvirus pandemic, at the recommendation of the CDC, we are looking to limit gatherings in our waiting area, and thus will reschedule their appointment beyond four weeks from today.   °_____________  ° °COVID-19 Pre-Screening Questions: ° °• Do you currently have a fever? No (yes = cancel and refer to pcp for e-visit) °• Have you recently travelled on a cruise, internationally, or to [place], [place], [place], [place], [place], or [place], [place] ([place]) ? No (yes = cancel, stay home, monitor symptoms, and contact pcp or initiate e-visit if symptoms develop) °• Have you been in contact with someone that is currently pending confirmation of Covid19 testing or has been confirmed to have the Covid19 virus No (yes = cancel, stay home, away from tested individual, monitor symptoms, and contact pcp or initiate e-visit if symptoms develop) °• Are you currently experiencing fatigue or cough? No (yes = pt should be prepared to have a mask placed at the time of their visit). ° ° °   ° ° ° ° °

## 2019-02-01 NOTE — Telephone Encounter (Signed)
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  This was evident on her last carotid Doppler study performed last year.  She has not been symptomatic before.  She probably needs an office visit to discuss potential angiography and intervention.  This is not an emergency and can be put off until we are safely seeing patients in the office.

## 2019-02-01 NOTE — Telephone Encounter (Signed)
   Primary Cardiologist:  [name], MD   Patient contacted.  History reviewed.  No symptoms to suggest any unstable cardiac conditions.  Based on discussion, with current pandemic situation, we will be postponing this appointment for Laurie Horton with a plan for f/u in 6-12 wks or sooner if feasible/necessary.  If symptoms change, she has been instructed to contact our office.   Routing to C19 CANCEL pool for tracking (P CV DIV CV19 CANCEL - reason for visit "other.") and assigning priority (1 = 4-6 wks, 2 = 6-12 wks, 3 = >12 wks).   Jacqulynn Cadet, CMA  02/01/2019 11:22 AM         .

## 2019-02-02 ENCOUNTER — Telehealth: Payer: Self-pay

## 2019-02-02 NOTE — Telephone Encounter (Signed)
Please direct this message to [facility]

## 2019-02-02 NOTE — Telephone Encounter (Signed)
Called pt and informed of the following from Dr. 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  This was evident on her last carotid Doppler study performed last year.  She has not been symptomatic before.  She probably needs an office visit to discuss potential angiography and intervention.  This is not an emergency and can be put off until we are safely seeing patients in the office."  Pt okay with postponing OV

## 2019-02-02 NOTE — Telephone Encounter (Signed)
I called the patient to prescreen for the coumadin appt and make her aware of drive thru and the patient stated that her Irbesartan is causing dizziness, fell down three steps, stomach pain/cramping, and cohesiveness. Needs instructions on what to do. Pt is wondering also about a recall with this medication as well.

## 2019-02-02 NOTE — Telephone Encounter (Signed)
Returned call to pt - BP has ranged 98/72 to a high reading of 144/94 last night, typically running on the lower end of this range. Reports BP was previously very well controlled on losartan 25mg  daily (120s/70s). Discussed with pt that irbesartan typically lowers BP a bit more effectively than losartan, so we may be treating her BP too aggressively currently. Will have pt decrease losartan to 37.5mg  and cut her 75mg  tablets in half to take 1/2 tablet each day. She will continue to monitor her BP and for resolution of her dizziness.

## 2019-02-02 NOTE — Telephone Encounter (Signed)

## 2019-02-03 ENCOUNTER — Ambulatory Visit: Payer: Medicare Other | Admitting: Cardiovascular Disease

## 2019-02-03 ENCOUNTER — Other Ambulatory Visit: Payer: Self-pay

## 2019-02-03 ENCOUNTER — Ambulatory Visit (INDEPENDENT_AMBULATORY_CARE_PROVIDER_SITE_OTHER): Payer: Medicare Other | Admitting: Pharmacist

## 2019-02-03 DIAGNOSIS — I480 Paroxysmal atrial fibrillation: Secondary | ICD-10-CM

## 2019-02-03 DIAGNOSIS — Z7901 Long term (current) use of anticoagulants: Secondary | ICD-10-CM | POA: Diagnosis not present

## 2019-02-03 LAB — POCT INR: INR: 2.4 (ref 2.0–3.0)

## 2019-02-07 ENCOUNTER — Other Ambulatory Visit: Payer: Self-pay | Admitting: Family Medicine

## 2019-02-07 NOTE — Telephone Encounter (Signed)
Requested medication (s) are due for refill today: yes  Requested medication (s) are on the active medication list: yes  Last refill: 04/13/18  Future visit scheduled: yes  Notes to clinic: not delegated   Requested Prescriptions  Pending Prescriptions Disp Refills   ondansetron (ZOFRAN-ODT) 8 MG disintegrating tablet [Pharmacy Med Name: ONDANSETRON ODT 8 MG TABLET] 20 tablet 0    Sig: TAKE 1 TABLET BY MOUTH EVERY 8 HOURS AS NEEDED NAUSEA OR VOMITING     Not Delegated - Gastroenterology: Antiemetics Failed - 02/07/2019  2:52 PM      Failed - This refill cannot be delegated      Passed - Valid encounter within last 6 months    Recent Outpatient Visits          2 months ago Hyperglycemia, drug-induced   [facility] at [facility], Lilia Argue, MD   10 months ago Medicare annual wellness visit, subsequent   Primary Care at [facility], Renette Butters, MD   1 year ago Essential hypertension   Primary Care at [facility], Renette Butters, MD   1 year ago Encounter for Medicare annual wellness exam   Primary Care at [facility], Renette Butters, MD   1 year ago Shortness of breath   Primary Care at [facility], Ines Bloomer, MD      Future Appointments            In 3 months Rutherford Guys, MD [facility] at [place], [facility]   In 4 months Gwenlyn Found, Pearletha Forge, MD [facility], [facility]

## 2019-02-13 ENCOUNTER — Other Ambulatory Visit: Payer: Self-pay

## 2019-02-13 ENCOUNTER — Ambulatory Visit (INDEPENDENT_AMBULATORY_CARE_PROVIDER_SITE_OTHER): Payer: Medicare Other | Admitting: Psychiatry

## 2019-02-13 DIAGNOSIS — F331 Major depressive disorder, recurrent, moderate: Secondary | ICD-10-CM | POA: Diagnosis not present

## 2019-02-13 DIAGNOSIS — F411 Generalized anxiety disorder: Secondary | ICD-10-CM

## 2019-02-13 MED ORDER — TRAZODONE HCL 100 MG PO TABS: ORAL_TABLET | ORAL | 1 refills | Status: DC

## 2019-02-13 MED ORDER — DIAZEPAM 2 MG PO TABS: 2.0000 mg | ORAL_TABLET | Freq: Every day | ORAL | 0 refills | Status: DC | PRN

## 2019-02-13 MED ORDER — DESVENLAFAXINE SUCCINATE ER 50 MG PO TB24: 50.0000 mg | ORAL_TABLET | Freq: Every day | ORAL | 2 refills | Status: DC

## 2019-02-13 NOTE — Progress Notes (Signed)
Virtual Visit via Telephone Note  I connected with Laurie Horton on 02/13/19 at  8:40 AM EDT by telephone and verified that I am speaking with the correct person using two identifiers.   I discussed the limitations, risks, security and privacy concerns of performing an evaluation and management service by telephone and the availability of in person appointments. I also discussed with the patient that there may be a patient responsible charge related to this service. The patient expressed understanding and agreed to proceed.   History of Present Illness: Patient evaluated through phone session.  She admitted anxious and overwhelmed due to current pandemic coronavirus.  She does not watch news because it makes her more sad.  On her last visit we started Lamictal but she develop a rash and it is now discontinued.  She admitted some time get confused about day and time.  She does not leave the house because of chronic health issues and is scared of pandemic virus.  His daughter who is blind and her husband by grossly for her and dropped.  She is not happy with her other daughter who does not talk as much.  However patient decided not to get upset.  She watch Netflix most of the time and avoid watching news.  She tried to walk her dog every day.  She admitted sleep on and off because recently fell and hurt her shoulder.  She admitted sometimes crying spells but denies any active or passive suicidal thoughts.  She is taking Valium once in a while when she is very nervous.  Her energy level is fair.  She had a good support from her friends and neighbors.  Her appetite is okay.  She denies drinking or using any illegal substances.  Past Psychiatric History: Reviewed. H/O depression and anxiety. No H/O inpatient treatment, suicidal attempt, paranoia, mania and hallucination. Seeing psychiatrist in 2006 after a stroke. Tried Cymbalta, lexaproand Celexa in the past with limited response. Tried Ambien,  Lunesta, lexaproand Xanax but developed allergies and side effects. We tried increasing Pristiq but she couldn't afford side effects.   Observations/Objective: Limited mental status exams and done on the phone.  Patient describes her mood anxious.  Her speech is soft clear and coherent.  Thought process logical.  She denies any auditory or visual hallucination.  She denies any active or passive suicidal thoughts or homicidal thought.  There were no delusions or grandiosity.  Her thought process logical and goal-directed.  She is alert and oriented x3.  She reported no tremors shakes or any EPS.  Her fund of knowledge is adequate.  Her cognition is fair.  Her attention concentration is okay.  Her insight and judgment is okay.  Assessment and Plan: Major depressive disorder, recurrent.  Generalized anxiety disorder.  Patient admitted situational anxiety but does not want to change her medication.  She is taking trazodone full dose at night which helps.  She like to continue Pristiq 50 mg daily and Valium 2 mg as needed.  She does not have any more refills.  I will discontinue Lamictal due to allergic reaction.  Recommended to call us back if she has any question or any concern.  Reassurance given about current pandemic coronavirus situation.  Encouraged to continue to walk every day as her attention concentration may be distracted due to staying home and being isolated.  Encouraged to talk to her daughter every day.  Follow-up in 3 months.    Follow Up Instructions:    I discussed  the assessment and treatment plan with the patient. The patient was provided an opportunity to ask questions and all were answered. The patient agreed with the plan and demonstrated an understanding of the instructions.   The patient was advised to call back or seek an in-person evaluation if the symptoms worsen or if the condition fails to improve as anticipated.  I provided 20 minutes of non-face-to-face time during this  encounter.   Kathlee Nations, MD

## 2019-02-14 ENCOUNTER — Other Ambulatory Visit: Payer: Self-pay

## 2019-02-14 NOTE — Patient Outreach (Signed)
[facility]) Care Management  02/14/2019  Laurie Horton 08/01/1946 [id number]   Medication Adherence call to Laurie Horton  Patient did not want to engage. Patient is past due on Irbesartan 75 mg under [facility].   [facility] Direct Dial [phone]  Fax [phone] [email or web address]

## 2019-03-07 ENCOUNTER — Ambulatory Visit (HOSPITAL_COMMUNITY)
Admission: RE | Admit: 2019-03-07 | Discharge: 2019-03-07 | Disposition: A | Discharge status: Home / Self Care | Payer: Medicare Other | Source: Ambulatory Visit | Attending: Cardiovascular Disease | Admitting: Cardiovascular Disease

## 2019-03-07 ENCOUNTER — Other Ambulatory Visit: Payer: Self-pay

## 2019-03-07 ENCOUNTER — Ambulatory Visit (HOSPITAL_BASED_OUTPATIENT_CLINIC_OR_DEPARTMENT_OTHER)
Admission: RE | Admit: 2019-03-07 | Discharge: 2019-03-07 | Disposition: A | Discharge status: Home / Self Care | Payer: Medicare Other | Source: Ambulatory Visit | Attending: Cardiovascular Disease | Admitting: Cardiovascular Disease

## 2019-03-07 DIAGNOSIS — I739 Peripheral vascular disease, unspecified: Secondary | ICD-10-CM | POA: Diagnosis present

## 2019-03-08 ENCOUNTER — Telehealth: Payer: Self-pay | Admitting: Family Medicine

## 2019-03-08 NOTE — Telephone Encounter (Signed)
Called  pt to reschedule her 07.28.2020 appt LVM FR

## 2019-03-11 ENCOUNTER — Other Ambulatory Visit: Payer: Self-pay | Admitting: Physician Assistant

## 2019-03-22 ENCOUNTER — Telehealth: Payer: Self-pay

## 2019-03-22 NOTE — Telephone Encounter (Signed)
1. Do you currently have a fever? no (yes = cancel and refer to pcp for e-visit) 2. Have you recently travelled on a cruise, internationally, or to [place], [place], [place], [place], [place], or [place], [place] ([employer]) ? no (yes = cancel, stay home, monitor symptoms, and contact pcp or initiate e-visit if symptoms develop) 3. Have you been in contact with someone that is currently pending confirmation of Covid19 testing or has been confirmed to have the Covid19 virus?  no (yes = cancel, stay home, away from tested individual, monitor symptoms, and contact pcp or initiate e-visit if symptoms develop) 4. Are you currently experiencing fatigue or cough? yes (yes = pt should be prepared to have a mask placed at the time of their visit).  Pt. Advised that we are restricting visitors at this time and anyone present in the vehicle should meet the above criteria as well. Advised that visit will be at curbside for finger stick ONLY and will receive call with instructions. Pt also advised to please bring own pen for signature of arrival document.   

## 2019-03-24 ENCOUNTER — Ambulatory Visit (INDEPENDENT_AMBULATORY_CARE_PROVIDER_SITE_OTHER): Payer: Medicare Other

## 2019-03-24 ENCOUNTER — Other Ambulatory Visit: Payer: Self-pay

## 2019-03-24 DIAGNOSIS — Z7901 Long term (current) use of anticoagulants: Secondary | ICD-10-CM

## 2019-03-24 DIAGNOSIS — I480 Paroxysmal atrial fibrillation: Secondary | ICD-10-CM | POA: Diagnosis not present

## 2019-03-24 LAB — POCT INR: INR: 2.6 (ref 2.0–3.0)

## 2019-03-24 NOTE — Patient Instructions (Signed)
Description   Spoke with pt and instructed to continue taking 1/2 tablet daily except 1 tablet on Mondays and Fridays.  Repeat INR in 8 weeks.

## 2019-04-21 ENCOUNTER — Other Ambulatory Visit: Payer: Self-pay | Admitting: Cardiovascular Disease

## 2019-04-21 DIAGNOSIS — I739 Peripheral vascular disease, unspecified: Secondary | ICD-10-CM

## 2019-04-27 ENCOUNTER — Telehealth: Payer: Self-pay | Admitting: Physician Assistant

## 2019-04-27 NOTE — Telephone Encounter (Signed)
Phone visit/my chart/pre reg complete/consent obtained -- ttf °

## 2019-05-03 ENCOUNTER — Telehealth (INDEPENDENT_AMBULATORY_CARE_PROVIDER_SITE_OTHER): Payer: Medicare Other | Admitting: Physician Assistant

## 2019-05-03 VITALS — BP 128/74 | HR 78 | Temp 97.8°F | Ht 64.0 in | Wt 193.6 lb

## 2019-05-03 DIAGNOSIS — I739 Peripheral vascular disease, unspecified: Secondary | ICD-10-CM | POA: Diagnosis not present

## 2019-05-03 DIAGNOSIS — R42 Dizziness and giddiness: Secondary | ICD-10-CM | POA: Diagnosis not present

## 2019-05-03 DIAGNOSIS — I5032 Chronic diastolic (congestive) heart failure: Secondary | ICD-10-CM | POA: Diagnosis not present

## 2019-05-03 DIAGNOSIS — M79602 Pain in left arm: Secondary | ICD-10-CM | POA: Diagnosis not present

## 2019-05-03 DIAGNOSIS — I10 Essential (primary) hypertension: Secondary | ICD-10-CM

## 2019-05-03 DIAGNOSIS — I480 Paroxysmal atrial fibrillation: Secondary | ICD-10-CM

## 2019-05-03 DIAGNOSIS — Z8673 Personal history of transient ischemic attack (TIA), and cerebral infarction without residual deficits: Secondary | ICD-10-CM

## 2019-05-03 NOTE — Patient Instructions (Signed)
Medication Instructions:   STOP IRBESARTAN   If you need a refill on your cardiac medications before your next appointment, please call your pharmacy.   Lab work:  NONE ordered at this time of appointment   If you have labs (blood work) drawn today and your tests are completely normal, you will receive your results only by: Marland Kitchen MyChart Message (if you have MyChart) OR . A paper copy in the mail If you have any lab test that is abnormal or we need to change your treatment, we will call you to review the results.  Testing/Procedures:  NONE ordered at this time of appointment   Follow-Up: At [facility], you and your health needs are our priority.  As part of our continuing mission to provide you with exceptional heart care, we have created designated Provider Care Teams.  These Care Teams include your primary Cardiologist (physician) and Advanced Practice Providers (APPs -  Physician Assistants and Nurse Practitioners) who all work together to provide you with the care you need, when you need it. . Your physician recommends that you follow-up with Dr. Gwenlyn Found on May 19, 2019 1:45  Any Other Special Instructions Will Be Listed Below (If Applicable).  Blood pressure dairy with 2 blood pressure readings a day

## 2019-05-03 NOTE — Progress Notes (Signed)
Virtual Visit via Telephone Note   This visit type was conducted due to national recommendations for restrictions regarding the COVID-19 Pandemic (e.g. social distancing) in an effort to limit this patient's exposure and mitigate transmission in our community.  Due to her co-morbid illnesses, this patient is at least at moderate risk for complications without adequate follow up.  This format is felt to be most appropriate for this patient at this time.  The patient did not have access to video technology/had technical difficulties with video requiring transitioning to audio format only (telephone).  All issues noted in this document were discussed and addressed.  No physical exam could be performed with this format.  Please refer to the patient's chart for her  consent to telehealth for [facility].   Date:  05/03/2019   ID:  Laurie Horton, DOB 1946-10-21, MRN [id number]  Patient Location: Home Provider Location: Office  PCP:  Rutherford Guys, MD  Cardiologist:  Dr. Martinique Electrophysiologist:  None  PV specialist: Dr. Gwenlyn Found  Evaluation Performed:  Follow-Up Visit  Chief Complaint:  Left arm pain and dizziness  History of Present Illness:    Laurie Horton is a 73 y.o. female with PMH of chronic diastolic heart failure, PFO, PAD, PAF on coumadin, COPD, HTN and prior CVA.  Patient was previously admitted in April 2018 with worsening dyspnea in April 2018 and was found to be in atrial fibrillation with RVR.  Echocardiogram at the time showed EF of 20 to 25%.  She underwent IV diuresis.  Left and right heart cath performed in May 2018 showed severe two-vessel obstructive CAD with 100% RCA occlusion, 75% OM1, 90% small OM 2, EF 25 to 30%, mild pulmonary hypertension with normal LV filling pressure.  Medical therapy was recommended. Heart failure therapy was maximized.  Repeat echocardiogram performed in September 2018 showed improvement in EF to 50 to 55%.  She later developed  claudication symptoms and underwent lower extremity angiography by Dr. Gwenlyn Found.  This revealed 80% infrarenal aortic stenosis and left iliac stenosis with right common femoral stenosis.  Patient was referred to vascular surgery for consideration of aortobifemoral bypass surgery.  After pulmonary evaluation, she was felt to be too high risk for open surgery.  She eventually underwent arthrectomy and cover stenting of left iliac artery by Dr. Gwenlyn Found in March 2019.  She had improvement of her claudication symptoms on follow-up.  The aortic stenosis and the right common femoral artery stenosis were not felt to be amenable to percutaneous therapy.    Patient was last seen by Dr. Martinique on 01/02/2019 at which time she was doing well. Based on telephone note from 02/01/2019, she complained of problem obtaining blood pressure in her left arm and numbness in that arm.  The case was reviewed by Dr. Gwenlyn Found who felt patient likely has a left subclavian stenosis and likely will need office visit to discuss potential angiography and intervention however this is a elective procedure and not urgent in the setting of COVID-19 pandemic.  Recent ABI obtained on 03/07/2019 showed right ABI 0.86, left ABI 0.93.  Improved when compared to previous ABI.  Arterial Doppler showed no evidence of significant stenosis in the right common and external iliac artery.  Stent in the left common iliac artery was not visualized.  20-month follow-up study was recommended.  Patient was contacted today via telephone visit.  She complaining of 2 major issues.  First of all, in the past several month, she has been having  significant dizziness with drop in the blood pressure.  This has occurred after she was switched from losartan to irbesartan due to backorder.  The symptom improved however still persist even after 75 mg of irbesartan was cut in half.  I recommended she discontinue the irbesartan at this time and monitor her blood pressure in the next 2 weeks.   If her blood pressure does improve and her symptoms resolve, I likely will leave her off of ARB.  I plan to call her on 05/16/2019 to discuss her blood pressure and symptoms.  Her second issue has to do with her left arm pain.  She describes the pain as left shoulder pain radiating down the entire left arm.  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  It is unclear to me at this time what is causing her left arm pain.  Differential diagnosis include subclavian stenosis with steal syndrome, rotator cuff pain, or compressed nerve pain from her C-spine.  In order to further evaluate, she will need a earlier office visit with Dr. Gwenlyn Found.  If felt her symptoms are related to the subclavian stenosis, then she will need a angiography.  However if her symptom is more related to musculoskeletal issue, she may need a referral to the orthopedic service.  The patient does not have symptoms concerning for COVID-19 infection (fever, chills, cough, or new shortness of breath).    Past Medical History:  Diagnosis Date   CHF (congestive heart failure) ([place]) 03/02/2017   EF 40-81% 4481   Complication of anesthesia    various issues with oxygen  saturations post op   COPD (chronic obstructive pulmonary disease) ([facility])    Depression    Diverticulitis    DVT (deep venous thrombosis) (H[facility])    Fibromyalgia    GERD (gastroesophageal reflux disease)    Hiatal hernia    History of deviated nasal septum    left- side   HTN (hypertension)    Hyperlipidemia    Obesity    On supplemental oxygen therapy    concentrator at night @ 1.5 l/m or when sleeps. O2 Sat niormally 87.   Osteoporosis    PAT (paroxysmal atrial tachycardia) (HCC)    PFO (patent foramen ovale)    PULMONARY NODULE, LEFT LOWER LOBE 10/14/2009   35mm LLL nodule dec 2010. Stable and 51mm in Oct 2012. No further fu   PVD (peripheral vascular disease) with claudication ([place]) 12/2017   Right  middle lobe pneumonia ([place]) 07/24/2011   First noted at admit 07/10/11. Persists on cxr 07/22/11. Cleared on CT 08/24/11. No further followup   Stroke [facility])    TOBACCO ABUSE 06/04/2009   Past Surgical History:  Procedure Laterality Date   CARDIOVASCULAR STRESS TEST  12/26/2004   EF 74%. NO EVIDENCE OF ISCHEMIA   ESOPHAGOGASTRODUODENOSCOPY (EGD) WITH PROPOFOL N/A 04/15/2015   Procedure: ESOPHAGOGASTRODUODENOSCOPY (EGD) WITH PROPOFOL;  Surgeon: Laurence Spates, MD;  Location: [facility];  Service: Endoscopy;  Laterality: N/A;   KNEE ARTHROSCOPY  2000   left   LAPAROSCOPIC CHOLECYSTECTOMY  04-16-2010   [place]   LOWER EXTREMITY ANGIOGRAPHY N/A 09/09/2017   Procedure: Lower Extremity Angiography;  Surgeon: Lorretta Harp, MD;  Location: [place] [facility];  Service: Cardiovascular;  Laterality: N/A;   LOWER EXTREMITY INTERVENTION Left 01/17/2018   Procedure: LOWER EXTREMITY INTERVENTION;  Surgeon: Lorretta Harp, MD;  Location: [place] [facility];  Service: Cardiovascular;  Laterality: Left;   MOUTH SURGERY  03-26-15 multiple extractions stitches remains   PERIPHERAL VASCULAR INTERVENTION Left 01/17/2018   Procedure: PERIPHERAL VASCULAR INTERVENTION;  Surgeon: Lorretta Harp, MD;  Location: [place] [facility];  Service: Cardiovascular;  Laterality: Left;  COMMON ILIAC   RIGHT/LEFT HEART CATH AND CORONARY ANGIOGRAPHY N/A 03/04/2017   Procedure: Right/Left Heart Cath and Coronary Angiography;  Surgeon: Peter M Martinique, MD;  Location: [place] [facility];  Service: Cardiovascular;  Laterality: N/A;   TOTAL ABDOMINAL HYSTERECTOMY     post op needed oxygen was told "she gave them a scare"   TUBAL LIGATION     US ECHOCARDIOGRAPHY  11/20/2009   EF 55-60%     Current Meds  Medication Sig   carvedilol (COREG) 12.5 MG tablet TAKE 1 TABLET (12.5 MG TOTAL) BY MOUTH 2 (TWO) TIMES DAILY.   desvenlafaxine (PRISTIQ) 50 MG 24 hr tablet Take 1 tablet (50 mg total) by mouth daily.   diazepam  (VALIUM) 2 MG tablet Take 1 tablet (2 mg total) by mouth daily as needed for anxiety.   diclofenac sodium (VOLTAREN) 1 % GEL APPLY 2 GRAMS TO BOTH HANDS 4 TIMES A DAY   diphenhydrAMINE (BENADRYL) 25 mg capsule Take 25 mg by mouth as directed.   Eyelid Cleansers (OCUSOFT BABY EYELID & EYELASH EX) Apply topically.   furosemide (LASIX) 40 MG tablet TAKE 1 TABLET BY MOUTH EVERY DAY   gabapentin (NEURONTIN) 100 MG capsule Take 100-200 mg by mouth See admin instructions. 100 mg in am, 200 mg at noon and  200 mg at bedtime   HYDROcodone-acetaminophen (NORCO/VICODIN) 5-325 MG tablet Take 1 tablet by mouth 3 (three) times daily.    ipratropium (ATROVENT) 0.02 % nebulizer solution USE 1 VIAL BY NEBULIZATION 4 (FOUR) TIMES DAILY.   irbesartan (AVAPRO) 75 MG tablet Take 0.5 tablets (37.5 mg total) by mouth daily.   montelukast (SINGULAIR) 10 MG tablet TAKE 1 TABLET BY MOUTH EVERYDAY AT BEDTIME   nystatin cream (MYCOSTATIN) Apply 1 application topically 2 (two) times daily.   ondansetron (ZOFRAN-ODT) 8 MG disintegrating tablet TAKE 1 TABLET BY MOUTH EVERY 8 HOURS AS NEEDED NAUSEA OR VOMITING   oxybutynin (DITROPAN-XL) 10 MG 24 hr tablet TAKE 10 MG BY MOUTH EVERY DAY   OXYGEN Inhale 1.5-2 L into the lungs as needed (for shortness of breath).   pantoprazole (PROTONIX) 40 MG tablet Take 40 mg by mouth daily.    polyethylene glycol (MIRALAX / GLYCOLAX) packet Take 17 g by mouth every other day.    rosuvastatin (CRESTOR) 5 MG tablet TAKE 1 TABLET BY MOUTH 2 TIMES A WEEK. SUNDAY AND WEDNESDAY   spironolactone (ALDACTONE) 25 MG tablet TAKE 1 TABLET BY MOUTH EVERY DAY   tiZANidine (ZANAFLEX) 2 MG tablet Take 2 mg by mouth 3 (three) times daily. TAKE 1/2 TABLET IN THE AM, TAKE A 1/2 TABLET IN THE AFTERNOON TAKE A WHOLE TABLET AT BEDTIME   traZODone (DESYREL) 100 MG tablet Take 1/2 to one tab daily as needed for sleep   warfarin (COUMADIN) 5 MG tablet TAKE 1/2 TO 1 TABLET BY MOUTH EVERY DAY OR AS  DIRECTED BY COUMADIN CLINIC     Allergies:   Alprazolam, Bee venom, Iodine, Pseudoephedrine hcl er, Budesonide-formoterol fumarate, Crestor [rosuvastatin calcium], Esomeprazole magnesium, Flonase [fluticasone propionate], Lamictal [lamotrigine], Loratadine, Lotrimin [clotrimazole], Lunesta [eszopiclone], Oxcarbazepine, Statins, Zolpidem tartrate, Betadine [povidone iodine], Bevespi aerosphere [glycopyrrolate-formoterol], Clarithromycin, Effexor [venlafaxine], Lexapro [escitalopram oxalate], Aciphex [rabeprazole sodium], Alendronate sodium, Avelox [moxifloxacin hcl in nacl], Bextra [valdecoxib], Ceclor [cefaclor], Cephalexin, Covera-hs [verapamil hcl],  Dicyclomine hcl, Other, and Tessalon perles   Social History   Tobacco Use   Smoking status: Former Smoker    Packs/day: 3.00    Years: 30.00    Pack years: 90.00    Types: Cigarettes    Quit date: 06/02/2010    Years since quitting: 8.9   Smokeless tobacco: Never Used   Tobacco comment: QUIT IN 2011  Substance Use Topics   Alcohol use: No    Alcohol/week: 0.0 standard drinks   Drug use: No     Family Hx: The patient's family history includes Alzheimer's disease in her mother; Dementia in her mother; Diabetes in her mother and sister; Heart attack in her father; Heart attack (age of onset: 43) in her brother; Schizophrenia in her sister; Tremor in her sister.  ROS:   Please see the history of present illness.     All other systems reviewed and are negative.   Prior CV studies:   The following studies were reviewed today:  Cath 03/04/2017  Ost 1st Mrg to 1st Mrg lesion, 75 %stenosed.  2nd Mrg lesion, 90 %stenosed.  Ost RCA to Mid RCA lesion, 100 %stenosed.  There is severe left ventricular systolic dysfunction.  LV end diastolic pressure is normal.  The left ventricular ejection fraction is 25-35% by visual estimate.  Hemodynamic findings consistent with mild pulmonary hypertension.  LV end diastolic pressure is  normal.   1. Severe 2 vessel obstructive CAD    - 75% proximal OM1    - 90% small OM2    - 100% proximal RCA. Left to right collaterals.  2. Severe LV dysfunction 3. Mild pulmonary HTN with normal LV filling pressures.  4. Cardiac index 2.41 L/min/BSA   Plan: Medical management to try and optimize CHF therapy. Patient appears to be adequately diuresed at this time. Based on these results her cardiomyopathy is ischemic. I would treat her CAD medically. If cardiac cath is needed in the future would consider alternative access given difficulty from the right radial approach. Her rhythm during procedure is a multifocal atrial rhythm/tachycardia.    Carotid US 08/17/2018 Summary: Right Carotid: Velocities in the right ICA are consistent with a 40-59%                stenosis. Non-hemodynamically significant plaque <50% noted in                the CCA. The RICA velocities are elevated and have increased                compared to the prior exam.  Left Carotid: Velocities in the left ICA are consistent with a 40-59% stenosis.               Non-hemodynamically significant plaque noted in the CCA. The ECA               appears >50% stenosed. The LICA velocities are elevated and have               decreased compared to the prior exam.  Vertebrals:  Right vertebral artery demonstrates antegrade flow. Left vertebral              artery demonstrates retrograde flow. Subclavians: Right subclavian artery flow was disturbed. Monophasic flow on the              left.                Left subclavian steal. The pressure on the left is  53 mmHg lower              than the right. The right brachial artery is triphasic; monophasic              on the left.   ABI 03/07/2019 Right ABIs appear essentially unchanged compared to prior study on 08/2018. Left ABIs and TBIs appear increased compared to prior study on 08/2018.   Summary: Right: Resting right ankle-brachial index indicates mild right lower  extremity arterial disease. The right toe-brachial index is abnormal.  Left: Resting left ankle-brachial index indicates mild left lower extremity arterial disease. The left toe-brachial index is abnormal.  Labs/Other Tests and Data Reviewed:    EKG:  An ECG dated 01/02/2019 was personally reviewed today and demonstrated:  Sinus rhythm with anterior T wave inversion  Recent Labs: 05/03/2018: Magnesium 2.0 08/29/2018: Hemoglobin 12.1; Platelets 244 11/28/2018: ALT 9; BUN 14; Creatinine, Ser 0.98; Potassium 4.2; Sodium 137; TSH 2.460   Recent Lipid Panel Lab Results  Component Value Date/Time   CHOL 148 11/28/2018 09:43 AM   TRIG 146 11/28/2018 09:43 AM   HDL 35 (L) 11/28/2018 09:43 AM   CHOLHDL 4.2 11/28/2018 09:43 AM   CHOLHDL 4.6 07/02/2016 08:23 AM   LDLCALC 84 11/28/2018 09:43 AM    Wt Readings from Last 3 Encounters:  05/03/19 193 lb 9.6 oz (87.8 kg)  01/02/19 192 lb 9.6 oz (87.4 kg)  12/21/18 191 lb 6.4 oz (86.8 kg)     Objective:    Vital Signs:  BP 128/74 (Patient Position: Standing)    Pulse 78    Temp 97.8 F (36.6 C)    Ht 5\' 4"  (1.626 m)    Wt 193 lb 9.6 oz (87.8 kg)    BMI 33.23 kg/m    VITAL SIGNS:  reviewed  ASSESSMENT & PLAN:    1. Dizziness: Symptom occurred after irbesartan was added to her medical regimen to replace previous losartan due to backorder.  Her symptom persisted even after decrease irbesartan by half.  I will discontinue irbesartan at this time.  I will call her on 7/14 to follow-up on her symptoms.  2. Left arm pain: She says she is having trouble raising her left arm over certain level.  At this time it is unclear to me if her symptom is due to left subclavian stenosis versus rotator cuff issue versus nerve pain.  3. Chronic diastolic heart failure: Denies any recent orthopnea or PND.  Continue on the current dose of diuretic  4. PAF: On Coumadin.  5. PAD: history of left iliac atherectomy by Dr. Alvester Chou.  She has residual aortic stenosis and  also right common femoral stenosis not amenable to PCI.  6. Hypertension: Blood pressure stable today, however in the past week, she has been experiencing low blood pressure.  Will discontinue irbesartan.  7. History of CVA: No recent recurrence  COVID-19 Education: The signs and symptoms of COVID-19 were discussed with the patient and how to seek care for testing (follow up with PCP or arrange E-visit).  The importance of social distancing was discussed today.  Time:   Today, I have spent 21 minutes with the patient with telehealth technology discussing the above problems.     Medication Adjustments/Labs and Tests Ordered: Current medicines are reviewed at length with the patient today.  Concerns regarding medicines are outlined above.   Tests Ordered: No orders of the defined types were placed in this encounter.   Medication Changes: No orders of the  defined types were placed in this encounter.   Follow Up:  In Person in 3 week(s)  Signed, Almyra Deforest, [place]  05/03/2019 11:40 AM    [place]

## 2019-05-11 ENCOUNTER — Telehealth: Payer: Self-pay

## 2019-05-11 NOTE — Telephone Encounter (Signed)
Unable to lmom for prescreen  

## 2019-05-17 ENCOUNTER — Telehealth: Payer: Self-pay

## 2019-05-17 NOTE — Telephone Encounter (Signed)
Called patient about email she sent this morning.Stated she was upset she never received a call from [facility] yesterday.Stated she has been recording B/P readings for the past 2 weeks.B/P ranging 125/74 p-63,94/58 p- 88,142/86 p-56,146/95 p-88,132/73 p-57,141/92 p-80,140/82 p-89,130/104 p-86,130/74 p-57,152/76 p-71,130/85 p-75,125/79 I-37,955/83 p-88.Stated Irbesartan was stopped 05/03/19 due to dizziness.Stated she continues to have dizziness.She has been having pain in left shoulder and left arm,worse with movement for the past 2 months.Dr.Jordan advised to continue to monitor B/P and keep appointment with [facility] 7/16.

## 2019-05-18 ENCOUNTER — Ambulatory Visit (INDEPENDENT_AMBULATORY_CARE_PROVIDER_SITE_OTHER): Payer: Medicare Other | Admitting: Cardiovascular Disease

## 2019-05-18 ENCOUNTER — Other Ambulatory Visit: Payer: Self-pay | Admitting: *Deleted

## 2019-05-18 ENCOUNTER — Encounter: Payer: Self-pay | Admitting: Cardiovascular Disease

## 2019-05-18 ENCOUNTER — Ambulatory Visit (INDEPENDENT_AMBULATORY_CARE_PROVIDER_SITE_OTHER): Payer: Medicare Other | Admitting: Pharmacist Clinician (PhC)/ Clinical Pharmacy Specialist

## 2019-05-18 ENCOUNTER — Telehealth: Payer: Self-pay | Admitting: Family Medicine

## 2019-05-18 ENCOUNTER — Other Ambulatory Visit: Payer: Self-pay

## 2019-05-18 VITALS — BP 181/169 | HR 57 | Temp 97.3°F | Ht 64.0 in | Wt 197.0 lb

## 2019-05-18 DIAGNOSIS — I480 Paroxysmal atrial fibrillation: Secondary | ICD-10-CM

## 2019-05-18 DIAGNOSIS — Z01812 Encounter for preprocedural laboratory examination: Secondary | ICD-10-CM | POA: Diagnosis not present

## 2019-05-18 DIAGNOSIS — I712 Thoracic aortic aneurysm, without rupture, unspecified: Secondary | ICD-10-CM

## 2019-05-18 DIAGNOSIS — Z7901 Long term (current) use of anticoagulants: Secondary | ICD-10-CM

## 2019-05-18 DIAGNOSIS — I739 Peripheral vascular disease, unspecified: Secondary | ICD-10-CM

## 2019-05-18 DIAGNOSIS — I771 Stricture of artery: Secondary | ICD-10-CM

## 2019-05-18 LAB — POCT INR: INR: 2.7 (ref 2.0–3.0)

## 2019-05-18 MED ORDER — DIPHENHYDRAMINE HCL 50 MG PO TABS: ORAL_TABLET | ORAL | 1 refills | Status: DC

## 2019-05-18 MED ORDER — PREDNISONE 50 MG PO TABS: ORAL_TABLET | ORAL | 1 refills | Status: DC

## 2019-05-18 MED ORDER — OXYBUTYNIN CHLORIDE ER 10 MG PO TB24: ORAL_TABLET | ORAL | 0 refills | Status: DC

## 2019-05-18 MED ORDER — MONTELUKAST SODIUM 10 MG PO TABS: ORAL_TABLET | ORAL | 1 refills | Status: DC

## 2019-05-18 NOTE — Telephone Encounter (Signed)
Patient is calling back requesting theses medications. She stated the request was sent over from the pharmacy last Thursday. She is completely out of medicaiton

## 2019-05-18 NOTE — Assessment & Plan Note (Signed)
History of peripheral arterial disease status post diamondback orbital rotational atherectomy, PTA and covered stenting of a high-grade calcified left common iliac artery stenosis by myself 01/17/2018 with improvement in her Dopplers and her symptoms.  She continues to enjoy a the absence of claudication.  Recent Dopplers suggested her stent was patent.

## 2019-05-18 NOTE — Progress Notes (Signed)
05/18/2019 Laurie Horton   17-Mar-1946  [id number]  Primary Physician Rutherford Guys, MD Primary Cardiologist: Lorretta Harp MD Garret Reddish, [place], [place]  HPI:  Laurie Horton is a 73 y.o.  divorced Caucasian female mother of 88, grandmother of 30 grandchildren referred to me by Dr. Martinique for peripheral vascular evaluation because of limiting claudication.I last saw her in the office  09/02/2018.Marland KitchenShe does have a history of CAD status post right left heart cath performed 03/04/17 revealing severe 2 vessel obstructive CAD with occluded RCA and circumflex marginal disease. 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 She has a history of hypertension and hyperlipidemia. She also has fibromyalgia. She complains of left greater than right lower extremity claudication which is lifestyle limiting at less than a block. Lower extremity arterial Dopplers performed 07/28/17 revealed a right ABI 0.9 and a leftof .82. I perform abdominal aortography with bifemoral runoff 09/09/17 revealing an 80% calcified mid infrarenal abdominal aortic stenosis on a bend with a 25 mm gradient, 80% calcified left common iliac artery stenosis and what appeared to be a high-grade calcified right common femoral artery stenosis. She did see Dr. Trula Slade for consideration of aortobifemoral bypass grafting althoughshe was evaluated by Dr. [name] who did not feel that she was adequate pulmonary candidate to undergo general anesthesia. She was referred back here for endovascular therapy.  I performed diamondback orbital rotational atherectomy, VBX covered stenting on her left common iliac artery 01/17/2018 with an excellent angiographic and clinical result.  Her claudication resolved and she was able to shop and walk her dog without limitation.  Unfortunately, over the last month she has had recurrent lifestyle limiting claudication in her left leg although the Doppler suggest continued  patency of her stent with waveforms suggesting that as well.  She is complained of left upper extremity discomfort and has a known high-grade or occluded left subclavian artery by duplex ultrasound 08/17/2018 with a 50 mm upper extremity pressure differential.  She is on Coumadin for PAF.  It certainly possible that this is causing her left upper extremity pain versus something entirely different.   Current Meds  Medication Sig   carvedilol (COREG) 12.5 MG tablet TAKE 1 TABLET (12.5 MG TOTAL) BY MOUTH 2 (TWO) TIMES DAILY.   desvenlafaxine (PRISTIQ) 50 MG 24 hr tablet Take 1 tablet (50 mg total) by mouth daily.   diazepam (VALIUM) 2 MG tablet Take 1 tablet (2 mg total) by mouth daily as needed for anxiety.   diclofenac sodium (VOLTAREN) 1 % GEL APPLY 2 GRAMS TO BOTH HANDS 4 TIMES A DAY   Eyelid Cleansers (OCUSOFT BABY EYELID & EYELASH EX) Apply topically.   furosemide (LASIX) 40 MG tablet TAKE 1 TABLET BY MOUTH EVERY DAY   gabapentin (NEURONTIN) 100 MG capsule Take 100-200 mg by mouth See admin instructions. 100 mg in am, 200 mg at noon and  200 mg at bedtime   HYDROcodone-acetaminophen (NORCO/VICODIN) 5-325 MG tablet Take 1 tablet by mouth 3 (three) times daily.    ipratropium (ATROVENT) 0.02 % nebulizer solution USE 1 VIAL BY NEBULIZATION 4 (FOUR) TIMES DAILY.   nystatin cream (MYCOSTATIN) Apply 1 application topically 2 (two) times daily.   ondansetron (ZOFRAN-ODT) 8 MG disintegrating tablet TAKE 1 TABLET BY MOUTH EVERY 8 HOURS AS NEEDED NAUSEA OR VOMITING   OXYGEN Inhale 1.5-2 L into the lungs as needed (for shortness of breath).   pantoprazole (PROTONIX) 40 MG tablet Take 40 mg by mouth daily.  polyethylene glycol (MIRALAX / GLYCOLAX) packet Take 17 g by mouth every other day.    rosuvastatin (CRESTOR) 5 MG tablet TAKE 1 TABLET BY MOUTH 2 TIMES A WEEK. SUNDAY AND WEDNESDAY   spironolactone (ALDACTONE) 25 MG tablet TAKE 1 TABLET BY MOUTH EVERY DAY   tiZANidine (ZANAFLEX)  2 MG tablet Take 2 mg by mouth 3 (three) times daily. TAKE 1/2 TABLET IN THE AM, TAKE A 1/2 TABLET IN THE AFTERNOON TAKE A WHOLE TABLET AT BEDTIME   traZODone (DESYREL) 100 MG tablet Take 1/2 to one tab daily as needed for sleep   warfarin (COUMADIN) 5 MG tablet TAKE 1/2 TO 1 TABLET BY MOUTH EVERY DAY OR AS DIRECTED BY COUMADIN CLINIC   [DISCONTINUED] montelukast (SINGULAIR) 10 MG tablet TAKE 1 TABLET BY MOUTH EVERYDAY AT BEDTIME   [DISCONTINUED] oxybutynin (DITROPAN-XL) 10 MG 24 hr tablet TAKE 10 MG BY MOUTH EVERY DAY     Allergies  Allergen Reactions   Alprazolam Anaphylaxis and Other (See Comments)    REACTION: stops breathing   Bee Venom Anaphylaxis   Iodine Anaphylaxis, Swelling and Other (See Comments)    REACTION: swelling in throat   Pseudoephedrine Hcl Er Shortness Of Breath   Budesonide-Formoterol Fumarate Other (See Comments)    Blisters inside of mouth all over   Crestor [Rosuvastatin Calcium] Other (See Comments)    Unable to walk   Esomeprazole Magnesium Other (See Comments)    REACTION: "bouncing off walls"   Flonase [Fluticasone Propionate] Other (See Comments)    NOSE BLEED   Lamictal [Lamotrigine] Rash    Patient got rash, labored breathing, and diarrhea   Loratadine Other (See Comments)    claritin D causes shaking   Lotrimin [Clotrimazole] Other (See Comments)    Mouth blisters   Lunesta [Eszopiclone] Other (See Comments)    REACTION: "slept for a week"   Oxcarbazepine Other (See Comments)    Causes deep sleep and dizziness   Statins Other (See Comments)    Can't walk, legs won't work    Zolpidem Tartrate Other (See Comments)    REACTION: "slept for a week"   Betadine [Povidone Iodine] Other (See Comments)    Breathing problems   Bevespi Aerosphere [Glycopyrrolate-Formoterol] Other (See Comments)    Pt believes this caused mouth sores and thrush    Clarithromycin Other (See Comments)    All "mycins", Puts into "a" fib, Will take if  has to for severe sinus infection   Effexor [Venlafaxine] Nausea And Vomiting and Other (See Comments)    cramps   Lexapro [Escitalopram Oxalate] Other (See Comments)    hallucinations   Aciphex [Rabeprazole Sodium] Rash   Alendronate Sodium Other (See Comments)    "caused stomach problems for 3 days"   Avelox [Moxifloxacin Hcl In Nacl] Other (See Comments)    Stomach cramps.    Bextra [Valdecoxib] Rash   Ceclor [Cefaclor] Rash   Cephalexin Rash and Other (See Comments)    Pt states that she is possibly allergic to this - had a reaction to Cefaclor in the past and she does not want to these class drugs. Added per patient request.   Covera-Hs [Verapamil Hcl] Palpitations   Dicyclomine Hcl Rash   Other Other (See Comments)    Glue from ekg/heart monitor leads --rash, Any MYCINS   Tessalon Perles Rash    Social History   Socioeconomic History   Marital status: Divorced    Spouse name: Not on file   Number of children: 2  Years of education: Not on file   Highest education level: Not on file  Occupational History   Occupation: disability  Social Needs   Financial resource strain: Not on file   Food insecurity    Worry: Not on file    Inability: Not on file   Transportation needs    Medical: Not on file    Non-medical: Not on file  Tobacco Use   Smoking status: Former Smoker    Packs/day: 3.00    Years: 30.00    Pack years: 90.00    Types: Cigarettes    Quit date: 06/02/2010    Years since quitting: 8.9   Smokeless tobacco: Never Used   Tobacco comment: QUIT IN 2011  Substance and Sexual Activity   Alcohol use: No    Alcohol/week: 0.0 standard drinks   Drug use: No   Sexual activity: Yes    Partners: Male    Birth control/protection: Post-menopausal, Surgical  Lifestyle   Physical activity    Days per week: Not on file    Minutes per session: Not on file   Stress: Not on file  Relationships   Social connections    Talks on phone:  Not on file    Gets together: Not on file    Attends religious service: Not on file    Active member of club or organization: Not on file    Attends meetings of clubs or organizations: Not on file    Relationship status: Not on file   Intimate partner violence    Fear of current or ex partner: Not on file    Emotionally abused: Not on file    Physically abused: Not on file    Forced sexual activity: Not on file  Other Topics Concern   Not on file  Social History Narrative   Marital status: divorced in 1978 after ten years; dating casually in 2019.      Children: 3 biological children; 3 court appointed children; 6 grandchildren; 5 gg      Lives: alone; children in [place] and [place].      Employment: retired age 73; disability for COPD, CVA at age 55.      Tobacco: former smoker; quit smoking 2011.      Alcohol: none      Exercise: walks dog four times per day; goes to pool three times per week.      ADLs: drives; no assistant devices; does have a walker.  Cleaning is limited in 2018.  Daughter helps with cleaning.  Does own grocery shopping.      Advanced Directives: YES; DNR/DNI; HCPOA: Rolan Lipa Martin/daughter youngest.  Blind.                 Review of Systems: General: negative for chills, fever, night sweats or weight changes.  Cardiovascular: negative for chest pain, dyspnea on exertion, edema, orthopnea, palpitations, paroxysmal nocturnal dyspnea or shortness of breath Dermatological: negative for rash Respiratory: negative for cough or wheezing Urologic: negative for hematuria Abdominal: negative for nausea, vomiting, diarrhea, bright red blood per rectum, melena, or hematemesis Neurologic: negative for visual changes, syncope, or dizziness All other systems reviewed and are otherwise negative except as noted above.    Blood pressure (!) 181/169, pulse (!) 57, temperature (!) 97.3 F (36.3 C), height 5\' 4"  (1.626 m), weight 197 lb (89.4 kg), SpO2 95 %.    General appearance: alert and no distress Neck: no adenopathy, no carotid bruit, no JVD, supple, symmetrical, trachea  midline and thyroid not enlarged, symmetric, no tenderness/mass/nodules Lungs: clear to auscultation bilaterally Heart: regular rate and rhythm, S1, S2 normal, no murmur, click, rub or gallop Extremities: extremities normal, atraumatic, no cyanosis or edema Pulses: 2+ and symmetric Skin: Skin color, texture, turgor normal. No rashes or lesions Neurologic: Alert and oriented X 3, normal strength and tone. Normal symmetric reflexes. Normal coordination and gait  EKG not performed today  ASSESSMENT AND PLAN:   Claudication in peripheral vascular disease ([place]) History of peripheral arterial disease status post diamondback orbital rotational atherectomy, PTA and covered stenting of a high-grade calcified left common iliac artery stenosis by myself 01/17/2018 with improvement in her Dopplers and her symptoms.  She continues to enjoy a the absence of claudication.  Recent Dopplers suggested her stent was patent.  Subclavian artery stenosis, left [facility]) Ms. Ryles has a high-grade left subclavian artery stenosis by duplex ultrasound performed 08/17/2018 with a 50 mm upper extremity pressure differential.  She complains of chronic pain in her left arm on analgesics.  Is unclear whether this is the cause versus something entirely different such as rotator cuff or cervical radiculopathy.  She is on Coumadin anticoagulation.  Going to get a CTA to demonstrate patency and if there is a high-grade stenosis we will plan on performing angiography and potential endovascular therapy.      Lorretta Harp MD FACP,FACC,FAHA, [place] 05/18/2019 1:53 PM

## 2019-05-18 NOTE — Telephone Encounter (Signed)
Prescription sent

## 2019-05-18 NOTE — Addendum Note (Signed)
Addended by: Annita Brod on: 05/18/2019 02:40 PM   Modules accepted: Orders

## 2019-05-18 NOTE — Telephone Encounter (Signed)
Medication Refill - Medication: oxybutynin (DITROPAN-XL) 10 MG 24 hr tablet   montelukast (SINGULAIR) 10 MG tablet    Has the patient contacted their pharmacy? Yes.   (Agent: If no, request that the patient contact the pharmacy for the refill.) (Agent: If yes, when and what did the pharmacy advise?)  Preferred Pharmacy (with phone number or street name): [phone]  Pharmacy has sent this to [place] multiple times since 05/13/19  Agent: Please be advised that RX refills may take up to 3 business days. We ask that you follow-up with your pharmacy.

## 2019-05-18 NOTE — Assessment & Plan Note (Signed)
[name] has a high-grade left subclavian artery stenosis by duplex ultrasound performed 08/17/2018 with a 50 mm upper extremity pressure differential.  She complains of chronic pain in her left arm on analgesics.  Is unclear whether this is the cause versus something entirely different such as rotator cuff or cervical radiculopathy.  She is on Coumadin anticoagulation.  Going to get a CTA to demonstrate patency and if there is a high-grade stenosis we will plan on performing angiography and potential endovascular therapy.

## 2019-05-18 NOTE — Patient Instructions (Addendum)
Medication Instructions:  Your physician recommends that you continue on your current medications as directed. Please refer to the Current Medication list given to you today.  If you need a refill on your cardiac medications before your next appointment, please call your pharmacy.   Lab work: NONE If you have labs (blood work) drawn today and your tests are completely normal, you will receive your results only by: Marland Kitchen MyChart Message (if you have MyChart) OR . A paper copy in the mail If you have any lab test that is abnormal or we need to change your treatment, we will call you to review the results.  Testing/Procedures: Non-Cardiac CT scanning, (CAT scanning), is a noninvasive, special x-ray that produces cross-sectional images of the body using x-rays and a computer. CT scans help physicians diagnose and treat medical conditions. For some CT exams, a contrast material is used to enhance visibility in the area of the body being studied. CT scans provide greater clarity and reveal more details than regular x-ray exams. Your CT scan will be of your chest.   . If the patient has contrast allergy: ? Patient will need a prescription for Prednisone and very clear instructions (as follows): 1. Prednisone 50 mg - take 13 hours prior to test 2. Take another Prednisone 50 mg 7 hours prior to test 3. Take another Prednisone 50 mg 1 hour prior to test 4. Take Benadryl 50 mg 1 hour prior to test . Patient must complete all four doses of above prophylactic medications. . Patient will need a ride after test due to Benadryl.     Follow-Up: At [facility], you and your health needs are our priority.  As part of our continuing mission to provide you with exceptional heart care, we have created designated Provider Care Teams.  These Care Teams include your primary Cardiologist (physician) and Advanced Practice Providers (APPs -  Physician Assistants and Nurse Practitioners) who all work together to  provide you with the care you need, when you need it. You will need a follow up appointment in 6 months WITH DR. Gwenlyn Found. After reviewing the results of your Chest CT, Dr. Gwenlyn Found will determine if you will need a stent placed in that artery. Please call our office 2 months in advance to schedule this appointment.

## 2019-05-19 LAB — CBC
Hematocrit: 38 % (ref 34.0–46.6)
Hemoglobin: 12.7 g/dL (ref 11.1–15.9)
MCH: 29.7 pg (ref 26.6–33.0)
MCHC: 33.4 g/dL (ref 31.5–35.7)
MCV: 89 fL (ref 79–97)
Platelets: 251 10*3/uL (ref 150–450)
RBC: 4.27 x10E6/uL (ref 3.77–5.28)
RDW: 12.9 % (ref 11.7–15.4)
WBC: 7.4 10*3/uL (ref 3.4–10.8)

## 2019-05-19 LAB — BASIC METABOLIC PANEL
BUN/Creatinine Ratio: 13 (ref 12–28)
BUN: 12 mg/dL (ref 8–27)
CO2: 25 mmol/L (ref 20–29)
Calcium: 9.8 mg/dL (ref 8.7–10.3)
Chloride: 95 mmol/L — ABNORMAL LOW (ref 96–106)
Creatinine, Ser: 0.94 mg/dL (ref 0.57–1.00)
GFR calc Af Amer: 70 mL/min/{1.73_m2} (ref >59)
GFR calc non Af Amer: 60 mL/min/{1.73_m2} (ref >59)
Glucose: 107 mg/dL — ABNORMAL HIGH (ref 65–99)
Potassium: 3.8 mmol/L (ref 3.5–5.2)
Sodium: 138 mmol/L (ref 134–144)

## 2019-05-22 ENCOUNTER — Encounter: Payer: Self-pay | Admitting: Family Medicine

## 2019-05-22 ENCOUNTER — Other Ambulatory Visit: Payer: Self-pay

## 2019-05-22 ENCOUNTER — Ambulatory Visit (INDEPENDENT_AMBULATORY_CARE_PROVIDER_SITE_OTHER): Payer: Medicare Other | Admitting: Family Medicine

## 2019-05-22 VITALS — BP 130/80 | HR 76 | Temp 98.0°F | Ht 64.0 in | Wt 191.0 lb

## 2019-05-22 DIAGNOSIS — R739 Hyperglycemia, unspecified: Secondary | ICD-10-CM | POA: Diagnosis not present

## 2019-05-22 DIAGNOSIS — B372 Candidiasis of skin and nail: Secondary | ICD-10-CM | POA: Diagnosis not present

## 2019-05-22 DIAGNOSIS — I10 Essential (primary) hypertension: Secondary | ICD-10-CM | POA: Diagnosis not present

## 2019-05-22 DIAGNOSIS — T50905A Adverse effect of unspecified drugs, medicaments and biological substances, initial encounter: Secondary | ICD-10-CM

## 2019-05-22 DIAGNOSIS — N898 Other specified noninflammatory disorders of vagina: Secondary | ICD-10-CM | POA: Diagnosis not present

## 2019-05-22 DIAGNOSIS — Z7901 Long term (current) use of anticoagulants: Secondary | ICD-10-CM

## 2019-05-22 LAB — POCT URINALYSIS DIP (MANUAL ENTRY)
Bilirubin, UA: NEGATIVE
Glucose, UA: NEGATIVE mg/dL
Ketones, POC UA: NEGATIVE mg/dL
Leukocytes, UA: NEGATIVE
Nitrite, UA: NEGATIVE
Protein Ur, POC: NEGATIVE mg/dL
Spec Grav, UA: 1.01 (ref 1.010–1.025)
Urobilinogen, UA: 0.2 E.U./dL
pH, UA: 6.5 (ref 5.0–8.0)

## 2019-05-22 NOTE — Addendum Note (Signed)
Addended by: Amalia Hailey on: 05/22/2019 09:30 AM   Modules accepted: Orders

## 2019-05-22 NOTE — Patient Instructions (Signed)
° ° ° °  If you have lab work done today you will be contacted with your lab results within the next 2 weeks.  If you have not heard from us then please contact us. The fastest way to get your results is to register for My Chart. ° ° °IF you received an x-ray today, you will receive an invoice from [facility]. Please contact [facility] at [phone] with questions or concerns regarding your invoice.  ° °IF you received labwork today, you will receive an invoice from [facility]. Please contact [facility] at [phone] with questions or concerns regarding your invoice.  ° °Our billing staff will not be able to assist you with questions regarding bills from these companies. ° °You will be contacted with the lab results as soon as they are available. The fastest way to get your results is to activate your My Chart account. Instructions are located on the last page of this paperwork. If you have not heard from us regarding the results in 2 weeks, please contact this office. °  ° ° ° °

## 2019-05-22 NOTE — Progress Notes (Signed)
7/20/20208:17 AM  [name] 1946/02/14, 73 y.o., female [id number]  Chief Complaint  Patient presents with  . Vaginal Itching    reoccuring yeast infection    HPI:   Patient is a 73 y.o. female with past medical history significant for depression and anxiety, CAD, PAD, CHF, afib on warfarin, COPD,  Prediabetes, DDD lumbar spine, fibromylagia who presents today to who presents today for routine followup with concerns for yeast infection  Last OV Jan 2020 Recently saw vas surg for eval of claudication LUE - plan for CTA Last saw card in may2020 - telemedicine Last saw psych in April 2020 Last saw pulm on feb 2020  Upcoming Left cataract surgery, Dr Katy Fitch  Overall she is doing well  Had febrile illness 2 weeks ago from which she has fully recovered Fever 101, chills, cough with scant hemoptysis, SOB, diarrhea, abd cramps, very fatigued  Continuing with intertrigo yeast infections Nystatin ointment did not help Very itchy and with odor She has been using cornstarch and keeping dry with toilet paper Wears depends and cares for farm animals Has a dermatologist Dr Pearline Cables, she has not discussed this with him  Lab Results  Component Value Date   CREATININE 0.94 05/18/2019   BUN 12 05/18/2019   NA 138 05/18/2019   K 3.8 05/18/2019   CL 95 (L) 05/18/2019   CO2 25 05/18/2019   Lab Results  Component Value Date   CHOL 148 11/28/2018   HDL 35 (L) 11/28/2018   LDLCALC 84 11/28/2018   TRIG 146 11/28/2018   CHOLHDL 4.2 11/28/2018    Depression screen PHQ 2/9 11/28/2018 04/13/2018 04/13/2018  Decreased Interest 0 1 0  Down, Depressed, Hopeless 0 1 0  PHQ - 2 Score 0 2 0  Altered sleeping - 1 -  Tired, decreased energy - 1 -  Change in appetite - 0 -  Feeling bad or failure about yourself  - 1 -  Trouble concentrating - 0 -  Moving slowly or fidgety/restless - 0 -  Suicidal thoughts - 0 -  PHQ-9 Score - 5 -  Difficult doing work/chores - - -  Some recent data might  be hidden    Fall Risk  05/22/2019 11/28/2018 04/13/2018 04/13/2018 10/20/2017  Falls in the past year? 0 0 No No No  Number falls in past yr: 0 - - - -  Comment - - - - -  Injury with Fall? 0 - - - -  Comment - - - - -  Follow up - - - - -     Allergies  Allergen Reactions  . Alprazolam Anaphylaxis and Other (See Comments)    REACTION: stops breathing  . Bee Venom Anaphylaxis  . Iodine Anaphylaxis, Swelling and Other (See Comments)    REACTION: swelling in throat  . Pseudoephedrine Hcl Er Shortness Of Breath  . Budesonide-Formoterol Fumarate Other (See Comments)    Blisters inside of mouth all over  . Crestor [Rosuvastatin Calcium] Other (See Comments)    Unable to walk  . Esomeprazole Magnesium Other (See Comments)    REACTION: "bouncing off walls"  . Flonase [Fluticasone Propionate] Other (See Comments)    NOSE BLEED  . Lamictal [Lamotrigine] Rash    Patient got rash, labored breathing, and diarrhea  . Loratadine Other (See Comments)    claritin D causes shaking  . Lotrimin [Clotrimazole] Other (See Comments)    Mouth blisters  . Lunesta [Eszopiclone] Other (See Comments)    REACTION: "slept for  a week"  . Oxcarbazepine Other (See Comments)    Causes deep sleep and dizziness  . Statins Other (See Comments)    Can't walk, legs won't work   . Zolpidem Tartrate Other (See Comments)    REACTION: "slept for a week"  . Betadine [Povidone Iodine] Other (See Comments)    Breathing problems  . Bevespi Aerosphere [Glycopyrrolate-Formoterol] Other (See Comments)    Pt believes this caused mouth sores and thrush   . Clarithromycin Other (See Comments)    All "mycins", Puts into "a" fib, Will take if has to for severe sinus infection  . Effexor [Venlafaxine] Nausea And Vomiting and Other (See Comments)    cramps  . Lexapro [Escitalopram Oxalate] Other (See Comments)    hallucinations  . Aciphex [Rabeprazole Sodium] Rash  . Alendronate Sodium Other (See Comments)    "caused  stomach problems for 3 days"  . Avelox [Moxifloxacin Hcl In Nacl] Other (See Comments)    Stomach cramps.   Darlin Coco [Valdecoxib] Rash  . Ceclor [Cefaclor] Rash  . Cephalexin Rash and Other (See Comments)    Pt states that she is possibly allergic to this - had a reaction to Cefaclor in the past and she does not want to these class drugs. Added per patient request.  . Covera-Hs [Verapamil Hcl] Palpitations  . Dicyclomine Hcl Rash  . Other Other (See Comments)    Glue from ekg/heart monitor leads --rash, Any MYCINS  . Tessalon Perles Rash    Prior to Admission medications   Medication Sig Start Date End Date Taking? Authorizing Provider  carvedilol (COREG) 12.5 MG tablet TAKE 1 TABLET (12.5 MG TOTAL) BY MOUTH 2 (TWO) TIMES DAILY. 06/15/18 06/10/19 Yes Martinique, Peter M, MD  desvenlafaxine (PRISTIQ) 50 MG 24 hr tablet Take 1 tablet (50 mg total) by mouth daily. 02/13/19  Yes Arfeen, Arlyce Harman, MD  diazepam (VALIUM) 2 MG tablet Take 1 tablet (2 mg total) by mouth daily as needed for anxiety. 02/13/19  Yes Arfeen, Arlyce Harman, MD  diclofenac sodium (VOLTAREN) 1 % GEL APPLY 2 GRAMS TO BOTH HANDS 4 TIMES A DAY 06/21/18  Yes [provider]  diphenhydrAMINE (BENADRYL) 25 mg capsule Take 25 mg by mouth as directed.   Yes [provider]  diphenhydrAMINE (BENADRYL) 50 MG tablet Patient will need a prescription for Prednisone and very clear instructions (as follows): Prednisone 50 mg - take 13 hours prior to test Take another Prednisone 50 mg 7 hours prior to test Take another Prednisone 50 mg 1 hour prior to test Take Benadryl 50 mg 1 hour prior to test Patient must complete all four doses of above prophylactic medications. Patient will need a ride after test due to Benadryl. 05/18/19  Yes Lorretta Harp, MD  Eyelid Cleansers (OCUSOFT BABY EYELID & EYELASH EX) Apply topically.   Yes [provider]  furosemide (LASIX) 40 MG tablet TAKE 1 TABLET BY MOUTH EVERY DAY 06/27/18  Yes Martinique,  Peter M, MD  gabapentin (NEURONTIN) 100 MG capsule Take 100-200 mg by mouth See admin instructions. 100 mg in am, 200 mg at noon and  200 mg at bedtime 09/16/16  Yes [provider]  HYDROcodone-acetaminophen (NORCO/VICODIN) 5-325 MG tablet Take 1 tablet by mouth 3 (three) times daily.    Yes [provider]  ipratropium (ATROVENT) 0.02 % nebulizer solution USE 1 VIAL BY NEBULIZATION 4 (FOUR) TIMES DAILY. 08/19/18  Yes Lauraine Rinne, NP  montelukast (SINGULAIR) 10 MG tablet TAKE 1 TABLET  BY MOUTH EVERYDAY AT BEDTIME 05/18/19  Yes Rutherford Guys, MD  nystatin cream (MYCOSTATIN) Apply 1 application topically 2 (two) times daily. 11/28/18  Yes Rutherford Guys, MD  ondansetron (ZOFRAN-ODT) 8 MG disintegrating tablet TAKE 1 TABLET BY MOUTH EVERY 8 HOURS AS NEEDED NAUSEA OR VOMITING 02/09/19  Yes Rutherford Guys, MD  oxybutynin (DITROPAN-XL) 10 MG 24 hr tablet TAKE 10 MG BY MOUTH EVERY DAY 05/18/19  Yes Rutherford Guys, MD  OXYGEN Inhale 1.5-2 L into the lungs as needed (for shortness of breath).   Yes [provider]  pantoprazole (PROTONIX) 40 MG tablet Take 40 mg by mouth daily.  07/24/13  Yes [provider]  polyethylene glycol (MIRALAX / GLYCOLAX) packet Take 17 g by mouth every other day.    Yes [provider]  predniSONE (DELTASONE) 50 MG tablet Patient will need a prescription for Prednisone and very clear instructions (as follows): Prednisone 50 mg - take 13 hours prior to test Take another Prednisone 50 mg 7 hours prior to test Take another Prednisone 50 mg 1 hour prior to test Take Benadryl 50 mg 1 hour prior to test Patient must complete all four doses of above prophylactic medications. Patient will need a ride after test due to Benadryl. 05/18/19  Yes Lorretta Harp, MD  rosuvastatin (CRESTOR) 5 MG tablet TAKE 1 TABLET BY MOUTH 2 TIMES A WEEK. SUNDAY AND WEDNESDAY 11/22/18  Yes Lorretta Harp, MD  spironolactone (ALDACTONE) 25 MG tablet TAKE  1 TABLET BY MOUTH EVERY DAY 03/13/19  Yes Bhagat, Bhavinkumar, PA  tiZANidine (ZANAFLEX) 2 MG tablet Take 2 mg by mouth 3 (three) times daily. TAKE 1/2 TABLET IN THE AM, TAKE A 1/2 TABLET IN THE AFTERNOON TAKE A WHOLE TABLET AT BEDTIME   Yes [provider]  traZODone (DESYREL) 100 MG tablet Take 1/2 to one tab daily as needed for sleep 02/13/19  Yes Arfeen, Arlyce Harman, MD  warfarin (COUMADIN) 5 MG tablet TAKE 1/2 TO 1 TABLET BY MOUTH EVERY DAY OR AS DIRECTED BY COUMADIN CLINIC 11/04/18  Yes Lorretta Harp, MD    Past Medical History:  Diagnosis Date  . CHF (congestive heart failure) ([place]) 03/02/2017   EF 25-30% 2018  . Complication of anesthesia    various issues with oxygen  saturations post op  . COPD (chronic obstructive pulmonary disease) ([place])   . Depression   . Diverticulitis   . DVT (deep venous thrombosis) ([place])   . Fibromyalgia   . GERD (gastroesophageal reflux disease)   . Hiatal hernia   . History of deviated nasal septum    left- side  . HTN (hypertension)   . Hyperlipidemia   . Obesity   . On supplemental oxygen therapy    concentrator at night @ 1.5 l/m or when sleeps. O2 Sat niormally 87.  . Osteoporosis   . PAT (paroxysmal atrial tachycardia) ([place])   . PFO (patent foramen ovale)   . PULMONARY NODULE, LEFT LOWER LOBE 10/14/2009   45mm LLL nodule dec 2010. Stable and 23mm in Oct 2012. No further fu  . PVD (peripheral vascular disease) with claudication ([place]) 12/2017  . Right middle lobe pneumonia ([place]) 07/24/2011   First noted at admit 07/10/11. Persists on cxr 07/22/11. Cleared on CT 08/24/11. No further followup  . Stroke ([place])   . TOBACCO ABUSE 06/04/2009    Past Surgical History:  Procedure Laterality Date  . CARDIOVASCULAR STRESS TEST  12/26/2004   EF 74%. NO EVIDENCE  OF ISCHEMIA  . ESOPHAGOGASTRODUODENOSCOPY (EGD) WITH PROPOFOL N/A 04/15/2015   Procedure: ESOPHAGOGASTRODUODENOSCOPY (EGD) WITH PROPOFOL;  Surgeon: Laurence Spates, MD;  Location: [facility];   Service: Endoscopy;  Laterality: N/A;  . KNEE ARTHROSCOPY  2000   left  . LAPAROSCOPIC CHOLECYSTECTOMY  04-16-2010   [facility]  . LOWER EXTREMITY ANGIOGRAPHY N/A 09/09/2017   Procedure: Lower Extremity Angiography;  Surgeon: Lorretta Harp, MD;  Location: [facility];  Service: Cardiovascular;  Laterality: N/A;  . LOWER EXTREMITY INTERVENTION Left 01/17/2018   Procedure: LOWER EXTREMITY INTERVENTION;  Surgeon: Lorretta Harp, MD;  Location: [facility];  Service: Cardiovascular;  Laterality: Left;  . MOUTH SURGERY     03-26-15 multiple extractions stitches remains  . PERIPHERAL VASCULAR INTERVENTION Left 01/17/2018   Procedure: PERIPHERAL VASCULAR INTERVENTION;  Surgeon: Lorretta Harp, MD;  Location: [facility];  Service: Cardiovascular;  Laterality: Left;  COMMON ILIAC  . RIGHT/LEFT HEART CATH AND CORONARY ANGIOGRAPHY N/A 03/04/2017   Procedure: Right/Left Heart Cath and Coronary Angiography;  Surgeon: Peter M Martinique, MD;  Location: [facility];  Service: Cardiovascular;  Laterality: N/A;  . TOTAL ABDOMINAL HYSTERECTOMY     post op needed oxygen was told "she gave them a scare"  . TUBAL LIGATION    . US ECHOCARDIOGRAPHY  11/20/2009   EF 55-60%    Social History   Tobacco Use  . Smoking status: Former Smoker    Packs/day: 3.00    Years: 30.00    Pack years: 90.00    Types: Cigarettes    Quit date: 06/02/2010    Years since quitting: 8.9  . Smokeless tobacco: Never Used  . Tobacco comment: QUIT IN 2011  Substance Use Topics  . Alcohol use: No    Alcohol/week: 0.0 standard drinks    Family History  Problem Relation Age of Onset  . Dementia Mother   . Diabetes Mother   . Alzheimer's disease Mother   . Heart attack Brother 35  . Heart attack Father   . Schizophrenia Sister   . Diabetes Sister   . Tremor Sister     Review of Systems  Constitutional: Negative for chills and fever.  Respiratory: Negative for cough and shortness of breath.    Cardiovascular: Negative for chest pain, palpitations and leg swelling.  Gastrointestinal: Negative for abdominal pain, nausea and vomiting.     OBJECTIVE:  Today's Vitals   05/22/19 0809  BP: 130/80  Pulse: 76  Temp: 98 F (36.7 C)  TempSrc: Oral  SpO2: 92%  Weight: 191 lb (86.6 kg)  Height: 5\' 4"  (1.626 m)   Body mass index is 32.79 kg/m.  Wt Readings from Last 3 Encounters:  05/22/19 191 lb (86.6 kg)  05/18/19 197 lb (89.4 kg)  05/03/19 193 lb 9.6 oz (87.8 kg)    Physical Exam Vitals signs and nursing note reviewed.  Constitutional:      Appearance: She is well-developed.  HENT:     Head: Normocephalic and atraumatic.     Mouth/Throat:     Pharynx: No oropharyngeal exudate.  Eyes:     General: No scleral icterus.    Conjunctiva/sclera: Conjunctivae normal.     Pupils: Pupils are equal, round, and reactive to light.  Neck:     Musculoskeletal: Neck supple.  Cardiovascular:     Rate and Rhythm: Normal rate and regular rhythm.     Heart sounds: Normal heart sounds. No murmur. No friction rub. No gallop.  Pulmonary:     Effort: Pulmonary effort is normal.     Breath sounds: Normal breath sounds. No wheezing or rales.  Skin:    General: Skin is warm and dry.     Findings: Rash (underneath pannus and groin area with large moist erythematous patches, no drainage or broken skin noted) present.  Neurological:     Mental Status: She is alert and oriented to person, place, and time.     Results for orders placed or performed in visit on 05/22/19 (from the past 24 hour(s))  POCT urinalysis dipstick     Status: Abnormal   Collection Time: 05/22/19  8:24 AM  Result Value Ref Range   Color, UA straw (A) yellow   Clarity, UA clear clear   Glucose, UA negative negative mg/dL   Bilirubin, UA negative negative   Ketones, POC UA negative negative mg/dL   Spec Grav, UA 1.010 1.010 - 1.025   Blood, UA trace-intact (A) negative   pH, UA 6.5 5.0 - 8.0   Protein Ur, POC  negative negative mg/dL   Urobilinogen, UA 0.2 0.2 or 1.0 E.U./dL   Nitrite, UA Negative Negative   Leukocytes, UA Negative Negative    No results found.   ASSESSMENT and PLAN  1. Candidal intertrigo Continue keeping clean and dry. Discussed use of OTC zinc oxide. Oral fluconazole interfers with warfarin, she has upcoming surgeries. Advise she discuss with derm. Checking a1c to ensure not contributing.  2. Vaginal itching No glucosuria - POCT urinalysis dipstick  3. Hyperglycemia, drug-induced - Hemoglobin A1c  4. Essential hypertension Controlled. Continue current regime.   5. Long term current use of anticoagulant therapy  Return in about 6 months (around 11/22/2019).    Rutherford Guys, MD [facility] [place], [place] [address] Ph.  [phone] Fax [phone]

## 2019-05-24 ENCOUNTER — Ambulatory Visit (HOSPITAL_COMMUNITY): Payer: Medicare Other | Admitting: Psychiatry

## 2019-05-25 ENCOUNTER — Ambulatory Visit (INDEPENDENT_AMBULATORY_CARE_PROVIDER_SITE_OTHER): Payer: Medicare Other | Admitting: Psychiatry

## 2019-05-25 ENCOUNTER — Encounter (HOSPITAL_COMMUNITY): Payer: Self-pay | Admitting: Psychiatry

## 2019-05-25 ENCOUNTER — Other Ambulatory Visit: Payer: Self-pay

## 2019-05-25 DIAGNOSIS — F411 Generalized anxiety disorder: Secondary | ICD-10-CM

## 2019-05-25 DIAGNOSIS — F331 Major depressive disorder, recurrent, moderate: Secondary | ICD-10-CM

## 2019-05-25 MED ORDER — DESVENLAFAXINE SUCCINATE ER 50 MG PO TB24: 50.0000 mg | ORAL_TABLET | Freq: Every day | ORAL | 2 refills | Status: DC

## 2019-05-25 MED ORDER — DIAZEPAM 2 MG PO TABS: 2.0000 mg | ORAL_TABLET | Freq: Every day | ORAL | 0 refills | Status: DC | PRN

## 2019-05-25 MED ORDER — TRAZODONE HCL 100 MG PO TABS: ORAL_TABLET | ORAL | 2 refills | Status: DC

## 2019-05-25 NOTE — Progress Notes (Addendum)
Virtual Visit via Telephone Note  I connected with Laurie Horton on 05/25/19 at  8:20 AM EDT by telephone and verified that I am speaking with the correct person using two identifiers.   I discussed the limitations, risks, security and privacy concerns of performing an evaluation and management service by telephone and the availability of in person appointments. I also discussed with the patient that there may be a patient responsible charge related to this service. The patient expressed understanding and agreed to proceed.   History of Present Illness: Patient was evaluated by phone session.  She is under a lot of stress because of her physical condition.  Recently she had fever and she went to see her primary care physician.  She had blood work.  Her arm is also hurting and her physician told that she may have aneurysm.  She has a test tomorrow to find out where his aneurysm.  She may need surgery.  She also not happy with her family situation.  Her oldest daughter does not talk to her and busy and do not come to visit her.  Her younger daughter is partially blind limited due to her blindnsess.  She has good neighbors but due to current COVID situation not able to see neighbors as much.  She tried to walk in her backyard.  She feels the current medicine is working but she need to take trazodone whole tablet every night to go to sleep.  She admitted some crying spells but she realized it is situational anxiety and hopefully when COVID and her health condition gets better she would be fine.  She denies any anger, irritability or any mood swing.  She stopped watching news but is still some time get emotional when she see, shows.  She denies any hallucination, paranoia, mania.  Her appetite is okay.  Her energy level is okay.  She denies drinking or using any illegal substances.  She tried to talk to her neighbors and friends on a regular basis which helps her a lot.  She is taking Valium as needed,  trazodone 100 mg at bedtime and Pristiq 50 mg daily.    Past Psychiatric History:Reviewed. H/O depression and anxiety. No H/Oinpatient treatment, suicidal attempt, paranoia,mania andhallucination. Seeing psychiatrist in 2006 after a stroke. Tried Cymbalta, lexaproand Celexa in the past with limited response. Tried Ambien, Lunesta, lexapro, Lamictaland Xanax but developed allergies and side effects. We tried increasing Pristiq but she couldn't affordside effects.  Recent Results (from the past 2160 hour(s))  POCT INR     Status: None   Collection Time: 03/24/19  9:19 AM  Result Value Ref Range   INR 2.6 2.0 - 3.0  POCT INR     Status: None   Collection Time: 05/18/19  2:12 PM  Result Value Ref Range   INR 2.7 2.0 - 3.0  CBC     Status: None   Collection Time: 05/18/19  2:39 PM  Result Value Ref Range   WBC 7.4 3.4 - 10.8 x10E3/uL   RBC 4.27 3.77 - 5.28 x10E6/uL   Hemoglobin 12.7 11.1 - 15.9 g/dL   Hematocrit 38.0 34.0 - 46.6 %   MCV 89 79 - 97 fL   MCH 29.7 26.6 - 33.0 pg   MCHC 33.4 31.5 - 35.7 g/dL   RDW 12.9 11.7 - 15.4 %   Platelets 251 150 - 450 Y70W2/BJ  Basic metabolic panel     Status: Abnormal   Collection Time: 05/18/19  2:39 PM  Result Value Ref Range   Glucose 107 (H) 65 - 99 mg/dL   BUN 12 8 - 27 mg/dL   Creatinine, Ser 0.94 0.57 - 1.00 mg/dL   GFR calc non Af Amer 60 >59 mL/min/1.73   GFR calc Af Amer 70 >59 mL/min/1.73   BUN/Creatinine Ratio 13 12 - 28   Sodium 138 134 - 144 mmol/L   Potassium 3.8 3.5 - 5.2 mmol/L   Chloride 95 (L) 96 - 106 mmol/L   CO2 25 20 - 29 mmol/L   Calcium 9.8 8.7 - 10.3 mg/dL  POCT urinalysis dipstick     Status: Abnormal   Collection Time: 05/22/19  8:24 AM  Result Value Ref Range   Color, UA straw (A) yellow   Clarity, UA clear clear   Glucose, UA negative negative mg/dL   Bilirubin, UA negative negative   Ketones, POC UA negative negative mg/dL   Spec Grav, UA 1.010 1.010 - 1.025   Blood, UA trace-intact (A)  negative   pH, UA 6.5 5.0 - 8.0   Protein Ur, POC negative negative mg/dL   Urobilinogen, UA 0.2 0.2 or 1.0 E.U./dL   Nitrite, UA Negative Negative   Leukocytes, UA Negative Negative      Psychiatric Specialty Exam: Physical Exam  ROS  There were no vitals taken for this visit.There is no height or weight on file to calculate BMI.  General Appearance: NA  Eye Contact:  NA  Speech:  Clear and Coherent  Volume:  Normal  Mood:  Depressed and emotional  Affect:  NA  Thought Process:  Goal Directed  Orientation:  Full (Time, Place, and Person)  Thought Content:  Rumination  Suicidal Thoughts:  No  Homicidal Thoughts:  No  Memory:  Immediate;   Good Recent;   Good Remote;   Good  Judgement:  Good  Insight:  Good  Psychomotor Activity:  NA  Concentration:  Concentration: Fair and Attention Span: Fair  Recall:  Good  Fund of Knowledge:  Good  Language:  Good  Akathisia:  No  Handed:  Right  AIMS (if indicated):     Assets:  Communication Skills Desire for Improvement Housing Resilience  ADL's:  Intact  Cognition:  WNL  Sleep:   fair      Assessment and Plan: Major depressive disorder, recurrent.  Generalized anxiety disorder.  Reassurance given.  We have tried higher dose of Pristiq but she could not tolerate.  We also tried Lamictal but she developed rash.  She does not want to change medication.  She is very resilience and she had a good support from her neighbors.  She is hoping her next appointment with the physician gave her a good news that she does not have aneurysm and does not need surgery.  I reviewed blood work results with her.  Her BUN/creatinine is normal.  I have offered therapy in the past but she is reluctant for therapy.  She talks to her younger daughter on a regular basis.  I encouraged to continue that.  Discussed medication side effects and benefits.  Recommended to call us back if she has any question or any concern.  Continue Pristiq 50 mg daily,  trazodone 100 mg at bedtime and Valium 2 mg as needed for severe anxiety.  Follow-up in 3 months.  Follow Up Instructions:    I discussed the assessment and treatment plan with the patient. The patient was provided an opportunity to ask questions and all were answered. The patient agreed  with the plan and demonstrated an understanding of the instructions.   The patient was advised to call back or seek an in-person evaluation if the symptoms worsen or if the condition fails to improve as anticipated.  I provided 20 minutes of non-face-to-face time during this encounter.   Kathlee Nations, MD

## 2019-05-26 ENCOUNTER — Ambulatory Visit (HOSPITAL_COMMUNITY): Payer: Medicare Other

## 2019-05-26 ENCOUNTER — Other Ambulatory Visit: Payer: Self-pay

## 2019-05-26 ENCOUNTER — Ambulatory Visit (HOSPITAL_COMMUNITY)
Admission: RE | Admit: 2019-05-26 | Discharge: 2019-05-26 | Disposition: A | Discharge status: Home / Self Care | Payer: Medicare Other | Source: Ambulatory Visit | Attending: Cardiovascular Disease | Admitting: Cardiovascular Disease

## 2019-05-26 DIAGNOSIS — I771 Stricture of artery: Secondary | ICD-10-CM | POA: Diagnosis present

## 2019-05-26 DIAGNOSIS — I712 Thoracic aortic aneurysm, without rupture, unspecified: Secondary | ICD-10-CM

## 2019-05-26 MED ORDER — IOHEXOL 350 MG/ML SOLN
80.0000 mL | Freq: Once | INTRAVENOUS | Status: AC | PRN
  Administered 2019-05-26: 80 mL via INTRAVENOUS

## 2019-05-30 ENCOUNTER — Ambulatory Visit: Payer: Medicare Other | Admitting: Family Medicine

## 2019-06-02 ENCOUNTER — Telehealth: Payer: Self-pay | Admitting: Family Medicine

## 2019-06-02 NOTE — Telephone Encounter (Signed)
fyi Dr. Pamella Pert.

## 2019-06-02 NOTE — Telephone Encounter (Signed)
Copied from [place] [id number]. Topic: Appointment Scheduling - Scheduling Inquiry for Clinic >> Jun 02, 2019 12:32 PM Burchel, Abbi R wrote: Reason for CRM: Pt called to cx appt for labs.  Pt states she will call to resch appt.  Pt also states she recently had eye surgery (05/29/2019) and there were some complications getting her pre-surgical lab work that caused some bruising/soreness in her arm.  Pt would also like to let Dr Pamella Pert know that the Zinc Oxide cream she was given for her rash worked well and the rash is gone.

## 2019-06-05 ENCOUNTER — Ambulatory Visit: Payer: Medicare Other

## 2019-06-09 ENCOUNTER — Encounter: Payer: Self-pay | Admitting: Cardiovascular Disease

## 2019-06-09 ENCOUNTER — Ambulatory Visit (INDEPENDENT_AMBULATORY_CARE_PROVIDER_SITE_OTHER): Payer: Medicare Other | Admitting: Cardiovascular Disease

## 2019-06-09 ENCOUNTER — Other Ambulatory Visit: Payer: Self-pay

## 2019-06-09 VITALS — BP 134/64 | HR 81 | Temp 97.9°F | Ht 64.0 in | Wt 194.0 lb

## 2019-06-09 DIAGNOSIS — I771 Stricture of artery: Secondary | ICD-10-CM

## 2019-06-09 DIAGNOSIS — Z008 Encounter for other general examination: Secondary | ICD-10-CM | POA: Diagnosis not present

## 2019-06-09 NOTE — Patient Instructions (Signed)
Medication Instructions:  Your physician recommends that you continue on your current medications as directed. Please refer to the Current Medication list given to you today.  If you need a refill on your cardiac medications before your next appointment, please call your pharmacy.   Lab work: NONE If you have labs (blood work) drawn today and your tests are completely normal, you will receive your results only by: . MyChart Message (if you have MyChart) OR . A paper copy in the mail If you have any lab test that is abnormal or we need to change your treatment, we will call you to review the results.  Testing/Procedures: NONE  Follow-Up: At [facility], you and your health needs are our priority.  As part of our continuing mission to provide you with exceptional heart care, we have created designated Provider Care Teams.  These Care Teams include your primary Cardiologist (physician) and Advanced Practice Providers (APPs -  Physician Assistants and Nurse Practitioners) who all work together to provide you with the care you need, when you need it. You will need a follow up appointment in 6 months WITH DR. BERRY.  Please call our office 2 months in advance to schedule this appointment.    

## 2019-06-09 NOTE — Progress Notes (Signed)
Laurie Horton returns today for follow-up of her CT angiogram evaluating her left subclavian artery performed 05/26/2019.  This did suggest a high-grade or subtotal calcified left subclavian artery stenosis terminating just before the takeoff of the vertebral artery.  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  She does complain of some dizziness on occasion as well as numbness and weakness in her left hand however she also has symptoms of rotator cuff issues on that side.  She cannot raise her arm past her shoulder or backwards without being in significant pain.  It is not clear to me that those symptoms are related to vascular compromise.  She is not a good candidate for open surgery because of her pulmonary issues.  She also has a 4.9 cm thoracic aortic aneurysm but is not a candidate for surgical revascularization.  At this point, given the confounding issue of her orthopedic problems as opposed to vascular sufficiency I do not feel compelled to proceed with angiography or intervention at this time.  Lorretta Harp, M.D., [place], [facility], [place] [address] [place], [place]  [address]  [phone] 06/09/2019 8:44 AM

## 2019-06-09 NOTE — Assessment & Plan Note (Signed)
Laurie Horton returns today for follow-up of her CT angiogram evaluating her left subclavian artery performed 05/26/2019.  This did suggest a high-grade or subtotal calcified left subclavian artery stenosis terminating just before the takeoff of the vertebral artery.  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  She does complain of some dizziness on occasion as well as numbness and weakness in her left hand however she also has symptoms of rotator cuff issues on that side.  She cannot raise her arm past her shoulder or backwards without being in significant pain.  It is not clear to me that those symptoms are related to vascular compromise.  She is not a good candidate for open surgery because of her pulmonary issues.  She also has a 4.9 cm thoracic aortic aneurysm but is not a candidate for surgical revascularization.  At this point, given the confounding issue of her orthopedic problems as opposed to vascular sufficiency I do not feel compelled to proceed with angiography or intervention at this time.

## 2019-06-12 NOTE — Telephone Encounter (Signed)
This encounter was created in error - please disregard.

## 2019-06-14 ENCOUNTER — Other Ambulatory Visit: Payer: Self-pay

## 2019-06-14 ENCOUNTER — Ambulatory Visit (INDEPENDENT_AMBULATORY_CARE_PROVIDER_SITE_OTHER): Payer: Medicare Other | Admitting: Family Medicine

## 2019-06-14 DIAGNOSIS — R739 Hyperglycemia, unspecified: Secondary | ICD-10-CM

## 2019-06-14 LAB — HEMOGLOBIN A1C
Est. average glucose Bld gHb Est-mCnc: 134 mg/dL
Hgb A1c MFr Bld: 6.3 % — ABNORMAL HIGH (ref 4.8–5.6)

## 2019-06-15 ENCOUNTER — Other Ambulatory Visit: Payer: Self-pay

## 2019-06-15 MED ORDER — CARVEDILOL 12.5 MG PO TABS: 12.5000 mg | ORAL_TABLET | Freq: Two times a day (BID) | ORAL | 11 refills | Status: DC

## 2019-06-21 ENCOUNTER — Encounter: Payer: Self-pay | Admitting: Orthopaedic Surgery

## 2019-06-21 ENCOUNTER — Ambulatory Visit (INDEPENDENT_AMBULATORY_CARE_PROVIDER_SITE_OTHER): Payer: Medicare Other | Admitting: Orthopaedic Surgery

## 2019-06-21 ENCOUNTER — Ambulatory Visit: Payer: Self-pay

## 2019-06-21 ENCOUNTER — Other Ambulatory Visit: Payer: Self-pay

## 2019-06-21 VITALS — Ht 64.0 in | Wt 193.0 lb

## 2019-06-21 DIAGNOSIS — M25512 Pain in left shoulder: Secondary | ICD-10-CM | POA: Insufficient documentation

## 2019-06-21 DIAGNOSIS — G8929 Other chronic pain: Secondary | ICD-10-CM | POA: Diagnosis not present

## 2019-06-21 MED ORDER — LIDOCAINE HCL 2 % IJ SOLN
2.0000 mL | INTRAMUSCULAR | Status: AC | PRN
  Administered 2019-06-21: 10:00:00 2 mL

## 2019-06-21 MED ORDER — METHYLPREDNISOLONE ACETATE 40 MG/ML IJ SUSP
80.0000 mg | INTRAMUSCULAR | Status: AC | PRN
  Administered 2019-06-21: 10:00:00 80 mg via INTRA_ARTICULAR

## 2019-06-21 MED ORDER — BUPIVACAINE HCL 0.5 % IJ SOLN
2.0000 mL | INTRAMUSCULAR | Status: AC | PRN
  Administered 2019-06-21: 10:00:00 2 mL via INTRA_ARTICULAR

## 2019-06-21 NOTE — Progress Notes (Signed)
Office Visit Note   Patient: Laurie Horton           Date of Birth: 1945-12-08           MRN: [id number] Visit Date: 06/21/2019              Requested by: [name], MD [address] [place],  [place] [address] PCP: [name], MD   Assessment & Plan: Visit Diagnoses:  1. 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  Long discussion regarding diagnostic possibilities and review of the x-rays.  Will inject the subacromial space with cortisone and discussed exercises.  Mrs. Portes did not want to attend physical therapy.  Reevaluate in 3 to 4 weeks.  Consider MRI scan.  Follow-Up Instructions: Return in about 1 month (around 07/22/2019).   Orders:  Orders Placed This Encounter  Procedures  . Large Joint Inj: L subacromial bursa  . XR Shoulder Left   No orders of the defined types were placed in this encounter.     Procedures: Large Joint Inj: L subacromial bursa on 06/21/2019 10:07 AM Indications: pain and diagnostic evaluation Details: 25 G 1.5 in needle, anterolateral approach  Arthrogram: No  Medications: 2 mL lidocaine 2 %; 2 mL bupivacaine 0.5 %; 80 mg methylPREDNISolone acetate 40 MG/ML Consent was given by the patient. Immediately prior to procedure a time out was called to verify the correct patient, procedure, equipment, support staff and site/side marked as required. Patient was prepped and draped in the usual sterile fashion.       Clinical Data: No additional findings.   Subjective: Chief Complaint  Patient presents with  . Arm Pain    Left  Patient presents today for her left arm. She is unable to lift her arm. She has weakness that comes and goes in her arm. She saw Dr.Barry, her cardiologist and was referred here. She has pain on the posterior side of her upper arm if she reaches for something wrong. She said that it will radiate into her shoulder. She takes hydrocodone three  times daily and Gabapentin. She has multiple aneursyms, one of which is in her neck on the left side.  No history of injury or trauma.  Has been on Coumadin for all of the above.  Considerable pain with attempted overhead motion of her left shoulder.  No problems on the right.  HPI  Review of Systems   Objective: Vital Signs: Ht 5\' 4"  (1.626 m)   Wt 193 lb (87.5 kg)   BMI 33.13 kg/m   Physical Exam Constitutional:      Appearance: She is well-developed.  Eyes:     Pupils: Pupils are equal, round, and reactive to light.  Pulmonary:     Effort: Pulmonary effort is normal.  Skin:    General: Skin is warm and dry.  Neurological:     Mental Status: She is alert and oriented to person, place, and time.  Psychiatric:        Behavior: Behavior normal.     Ortho Exam left shoulder with painful range of motion above about 60 degrees of flexion and abduction.  I can abduct 90 degrees with pain and flex just over 100 degrees.  Quite tight after that.  Skin intact.  Biceps intact.  Good grip and good release.  No pain with range of motion of cervical spine.  Difficult to assess assess Speed sign and empty can  testing because of her pain.  Specialty Comments:  No specialty comments available.  Imaging: Xr Shoulder Left  Result Date: 06/21/2019 Films of the left shoulder obtained in several projections.  Humeral head is centered about the glenoid.  Normal space between the humeral head and the acromion.  Some degenerative changes with hypertrophy at the acromioclavicular joint.  Decreased bone density.  No acute changes    PMFS History: Patient Active Problem List   Diagnosis Date Noted  . Pain in left shoulder 06/21/2019  . Subclavian artery stenosis, left (HCC) 05/18/2019  . Claudication in peripheral vascular disease (HCC) 08/06/2017  . Class 1 obesity due to excess calories with serious comorbidity and body mass index (BMI) of 31.0 to 31.9 in adult 05/01/2017  . Chronic systolic  CHF (congestive heart failure) (HCC) 03/04/2017  . Acute on chronic respiratory failure with hypoxia (HCC) 02/08/2017  . COPD, severe (HCC) 02/08/2017  . Acute diastolic heart failure, NYHA class 1 (HCC) 02/08/2017  . Fibromyalgia and chronic chest pain 02/08/2017  . Atrial fibrillation with RVR (HCC) 02/08/2017  . Acute heart failure (HCC) 02/08/2017  . Rapid atrial fibrillation (HCC) 02/08/2017  . Shortness of breath 02/08/2017  . Moderate episode of recurrent major depressive disorder (HCC) 01/04/2017  . Generalized anxiety disorder 01/04/2017  . Pure hypercholesterolemia 12/28/2016  . 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 Acute blood loss anemia 08/03/2016  . Carotid stenosis 04/02/2015  . Long term current use of anticoagulant therapy 09/26/2014  . PAF (paroxysmal atrial fibrillation) (HCC) 09/19/2014  . 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 Chronic respiratory failure with hypoxia (HCC) 06/27/2014  . 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 Idiopathic scoliosis and kyphoscoliosis 07/19/2013  . Hyperglycemia, drug-induced 10/12/2011  . Encounter for long-term (current) use of medications 08/21/2011  . Thrush 07/24/2011  . PULMONARY NODULE, LEFT LOWER LOBE 10/14/2009  . PATENT FORAMEN OVALE 10/14/2009  . Essential hypertension 06/04/2009  . G E R D 06/04/2009  . SNORING, HX OF 06/04/2009   Past Medical History:  Diagnosis Date  . CHF (congestive heart failure) (HCC) 03/02/2017   EF 25-30% 2018  . Complication of anesthesia    various issues with oxygen  saturations post op  . COPD (chronic obstructive pulmonary disease) (HCC)   . Depression   . Diverticulitis   . DVT (deep venous thrombosis) (HCC)   . Fibromyalgia   . GERD (gastroesophageal reflux disease)   . Hiatal hernia   . History of deviated nasal septum    left- side  . HTN (hypertension)   . Hyperlipidemia   . Obesity   . On supplemental  oxygen therapy    concentrator at night @ 1.5 l/m or when sleeps. O2 Sat niormally 87.  . Osteoporosis   . PAT (paroxysmal atrial tachycardia) (HCC)   . PFO (patent foramen ovale)   . PULMONARY NODULE, LEFT LOWER LOBE 10/14/2009   5mm LLL nodule dec 2010. Stable and 4mm in Oct 2012. No further fu  . PVD (peripheral vascular disease) with claudication (HCC) 12/2017  . Right middle lobe pneumonia (HCC) 07/24/2011   First noted at admit 07/10/11. Persists on cxr 07/22/11. Cleared on CT 08/24/11. No further followup  . Stroke (HCC)   . TOBACCO ABUSE 06/04/2009    Family History  Problem Relation Age of Onset  . Dementia Mother   . Diabetes Mother   . Alzheimer's disease Mother   . Heart attack Brother 28  .  Heart attack Father   . Schizophrenia Sister   . Diabetes Sister   . Tremor Sister     Past Surgical History:  Procedure Laterality Date  . CARDIOVASCULAR STRESS TEST  12/26/2004   EF 74%. NO EVIDENCE OF ISCHEMIA  . ESOPHAGOGASTRODUODENOSCOPY (EGD) WITH PROPOFOL N/A 04/15/2015   Procedure: ESOPHAGOGASTRODUODENOSCOPY (EGD) WITH PROPOFOL;  Surgeon: Carman Ching, MD;  Location: [facility];  Service: Endoscopy;  Laterality: N/A;  . KNEE ARTHROSCOPY  2000   left  . LAPAROSCOPIC CHOLECYSTECTOMY  04-16-2010   [place]  . LOWER EXTREMITY ANGIOGRAPHY N/A 09/09/2017   Procedure: Lower Extremity Angiography;  Surgeon: Runell Gess, MD;  Location: [facility] INVASIVE CV LAB;  Service: Cardiovascular;  Laterality: N/A;  . LOWER EXTREMITY INTERVENTION Left 01/17/2018   Procedure: LOWER EXTREMITY INTERVENTION;  Surgeon: Runell Gess, MD;  Location: [facility] INVASIVE CV LAB;  Service: Cardiovascular;  Laterality: Left;  . MOUTH SURGERY     03-26-15 multiple extractions stitches remains  . PERIPHERAL VASCULAR INTERVENTION Left 01/17/2018   Procedure: PERIPHERAL VASCULAR INTERVENTION;  Surgeon: Runell Gess, MD;  Location: [facility] INVASIVE CV LAB;  Service: Cardiovascular;  Laterality: Left;  COMMON ILIAC  .  RIGHT/LEFT HEART CATH AND CORONARY ANGIOGRAPHY N/A 03/04/2017   Procedure: Right/Left Heart Cath and Coronary Angiography;  Surgeon: Kelia Gibbon M [place], MD;  Location: [facility] INVASIVE CV LAB;  Service: Cardiovascular;  Laterality: N/A;  . TOTAL ABDOMINAL HYSTERECTOMY     post op needed oxygen was told "she gave them a scare"  . TUBAL LIGATION    . US ECHOCARDIOGRAPHY  11/20/2009   EF 55-60%   Social History   Occupational History  . Occupation: disability  Tobacco Use  . Smoking status: Former Smoker    Packs/day: 3.00    Years: 30.00    Pack years: 90.00    Types: Cigarettes    Quit date: 06/02/2010    Years since quitting: 9.0  . Smokeless tobacco: Never Used  . Tobacco comment: QUIT IN 2011  Substance and Sexual Activity  . Alcohol use: No    Alcohol/week: 0.0 standard drinks  . Drug use: No  . Sexual activity: Yes    Partners: Male    Birth control/protection: Post-menopausal, Surgical

## 2019-06-26 ENCOUNTER — Other Ambulatory Visit: Payer: Self-pay

## 2019-06-26 ENCOUNTER — Encounter: Payer: Self-pay | Admitting: Orthopaedic Surgery

## 2019-06-26 MED ORDER — FUROSEMIDE 40 MG PO TABS: 40.0000 mg | ORAL_TABLET | Freq: Every day | ORAL | 3 refills | Status: DC

## 2019-07-17 ENCOUNTER — Other Ambulatory Visit: Payer: Self-pay | Admitting: Family Medicine

## 2019-07-19 ENCOUNTER — Ambulatory Visit: Payer: Medicare Other | Admitting: Orthopaedic Surgery

## 2019-07-26 NOTE — Progress Notes (Signed)
Cardiology Office Note    Date:  07/31/2019   ID:  Mattox, Harps June 17, 1946, MRN [id number]  PCP:  [name], MD  Cardiologist: Dr. Swaziland   Chief Complaint  Patient presents with   Congestive Heart Failure    History of Present Illness:    Laurie Horton is a 73 y.o. female with past medical history of chronic diastolic CHF, PFO, PAF (on Coumadin), COPD (on 2L [place] at baseline), HTN, and prior CVA who is seen for follow up CHF.  She was admitted from 4/9 - 02/12/2017 for worsening dyspnea on exertion and palpitations. Was in atrial fibrillation with RVR upon arrival to the ED. Echo during admission showed a newly reduced EF of 20-25% and she was diuresed with IV Lasix. Enzymes were negative and EKG showed no acute ischemic changes. It was recommended to consider a right/left heart cath in 4-6 weeks.   She did undergo right and left heart cath on 03/04/17. This showed severe 2 vessel obstructive CAD with 100% RCA occlusion, 75% OM1, and 90% small OM2. EF 25-30%. Mild pulmonary HTN with normal LV filling pressures. 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 Maximizing CHF therapy recommended.  She did have repeat Echo in September 2018 showing improvement in EF to 50-55%.   Subsequent to this she developed significant claudication. She was seen by Dr. Allyson Sabal and had angiography showing 80% infrarenal aortic stenosis and left iliac stenosis. She also had severe right common femoral stenosis. She was seen by Dr Myra Gianotti for consideration of Aortobifemoral bypass. After pulmonary evaluation she was felt to be too high a risk for open surgery. In March 2019 she underwent atherectomy and covered stenting of the left iliac by Dr. Allyson Sabal. On follow up she did have improvement in her claudication and ABIs. The aortic and right common femoral artery stenoses are not felt to be amenable to percutaneous therapy.   In early July 2019 she was seen because she felt she was in Afib following  dental procedure. On arrival she was in NSR with PACs. No medical changes made.   She has been followed by Dr Allyson Sabal and repeat dopplers in May 2020 indicated continued patency of the stent. No significant claudication. She was noted to have a high grade left subclavian stenosis but it was unclear that this was symptomatic and given all her medical problems was felt best to manage medically. She had left shoulder injection by Dr Cleophas Dunker for adhesive capsulitis. She notes this has helped with her pain significantly. She is followed by pulmonary for COPD.   Seen for a televisit by Azalee Course PA-C in July. Complained of orthostatic dizziness particularly in the am. Her ARB was stopped but she states symptoms are the same.   On follow up today she is upset. Her dog recently passed. She denies any chest pain  increased SOB. No edema. Weight is stable. Claudication symptoms stable.     Past Medical History:  Diagnosis Date   CHF (congestive heart failure) (HCC) 03/02/2017   EF 25-30% 2018   Complication of anesthesia    various issues with oxygen  saturations post op   COPD (chronic obstructive pulmonary disease) (HCC)    Depression    Diverticulitis    DVT (deep venous thrombosis) (HCC)    Fibromyalgia    GERD (gastroesophageal reflux disease)    Hiatal hernia    History of deviated nasal septum    left- side   HTN (hypertension)  Hyperlipidemia    Obesity    On supplemental oxygen therapy    concentrator at night @ 1.5 l/m or when sleeps. O2 Sat niormally 87.   Osteoporosis    PAT (paroxysmal atrial tachycardia) (HCC)    PFO (patent foramen ovale)    PULMONARY NODULE, LEFT LOWER LOBE 10/14/2009   5mm LLL nodule dec 2010. Stable and 4mm in Oct 2012. No further fu   PVD (peripheral vascular disease) with claudication (HCC) 12/2017   Right middle lobe pneumonia (HCC) 07/24/2011   First noted at admit 07/10/11. Persists on cxr 07/22/11. Cleared on CT 08/24/11. No further  followup   Stroke [facility])    TOBACCO ABUSE 06/04/2009    Past Surgical History:  Procedure Laterality Date   CARDIOVASCULAR STRESS TEST  12/26/2004   EF 74%. NO EVIDENCE OF ISCHEMIA   ESOPHAGOGASTRODUODENOSCOPY (EGD) WITH PROPOFOL N/A 04/15/2015   Procedure: ESOPHAGOGASTRODUODENOSCOPY (EGD) WITH PROPOFOL;  Surgeon: Carman Ching, MD;  Location: [facility];  Service: Endoscopy;  Laterality: N/A;   KNEE ARTHROSCOPY  2000   left   LAPAROSCOPIC CHOLECYSTECTOMY  04-16-2010   [place]   LOWER EXTREMITY ANGIOGRAPHY N/A 09/09/2017   Procedure: Lower Extremity Angiography;  Surgeon: Runell Gess, MD;  Location: [facility] INVASIVE CV LAB;  Service: Cardiovascular;  Laterality: N/A;   LOWER EXTREMITY INTERVENTION Left 01/17/2018   Procedure: LOWER EXTREMITY INTERVENTION;  Surgeon: Runell Gess, MD;  Location: [facility] INVASIVE CV LAB;  Service: Cardiovascular;  Laterality: Left;   MOUTH SURGERY     03-26-15 multiple extractions stitches remains   PERIPHERAL VASCULAR INTERVENTION Left 01/17/2018   Procedure: PERIPHERAL VASCULAR INTERVENTION;  Surgeon: Runell Gess, MD;  Location: [facility] INVASIVE CV LAB;  Service: Cardiovascular;  Laterality: Left;  COMMON ILIAC   RIGHT/LEFT HEART CATH AND CORONARY ANGIOGRAPHY N/A 03/04/2017   Procedure: Right/Left Heart Cath and Coronary Angiography;  Surgeon: Sharesa Kemp M Swaziland, MD;  Location: [facility] INVASIVE CV LAB;  Service: Cardiovascular;  Laterality: N/A;   TOTAL ABDOMINAL HYSTERECTOMY     post op needed oxygen was told "she gave them a scare"   TUBAL LIGATION     US ECHOCARDIOGRAPHY  11/20/2009   EF 55-60%    Current Medications: Outpatient Medications Prior to Visit  Medication Sig Dispense Refill   carvedilol (COREG) 12.5 MG tablet Take 1 tablet (12.5 mg total) by mouth 2 (two) times daily. 60 tablet 11   desvenlafaxine (PRISTIQ) 50 MG 24 hr tablet Take 1 tablet (50 mg total) by mouth daily. 30 tablet 2   diazepam (VALIUM) 2 MG tablet Take 1 tablet (2 mg  total) by mouth daily as needed for anxiety. 10 tablet 0   diclofenac sodium (VOLTAREN) 1 % GEL APPLY 2 GRAMS TO BOTH HANDS 4 TIMES A DAY  5   Eyelid Cleansers (OCUSOFT BABY EYELID & EYELASH EX) Apply topically.     furosemide (LASIX) 40 MG tablet Take 1 tablet (40 mg total) by mouth daily. 90 tablet 3   gabapentin (NEURONTIN) 100 MG capsule Take 100-200 mg by mouth See admin instructions. 100 mg in am, 200 mg at noon and  200 mg at bedtime     HYDROcodone-acetaminophen (NORCO/VICODIN) 5-325 MG tablet Take 1 tablet by mouth 3 (three) times daily.      ipratropium (ATROVENT) 0.02 % nebulizer solution USE 1 VIAL BY NEBULIZATION 4 (FOUR) TIMES DAILY. 75 mL 3   montelukast (SINGULAIR) 10 MG tablet TAKE 1 TABLET BY MOUTH EVERYDAY AT BEDTIME 90 tablet 1   ondansetron (ZOFRAN-ODT)  8 MG disintegrating tablet TAKE 1 TABLET BY MOUTH EVERY 8 HOURS AS NEEDED NAUSEA OR VOMITING 20 tablet 0   oxybutynin (DITROPAN-XL) 10 MG 24 hr tablet TAKE 1 TABLET BY MOUTH EVERY DAY 30 tablet 2   OXYGEN Inhale 1.5-2 L into the lungs as needed (for shortness of breath).     pantoprazole (PROTONIX) 40 MG tablet Take 40 mg by mouth daily.      polyethylene glycol (MIRALAX / GLYCOLAX) packet Take 17 g by mouth every other day.      rosuvastatin (CRESTOR) 5 MG tablet TAKE 1 TABLET BY MOUTH 2 TIMES A WEEK. SUNDAY AND WEDNESDAY 24 tablet 2   spironolactone (ALDACTONE) 25 MG tablet TAKE 1 TABLET BY MOUTH EVERY DAY 30 tablet 10   tiZANidine (ZANAFLEX) 2 MG tablet Take 2 mg by mouth 3 (three) times daily. TAKE 1/2 TABLET IN THE AM, TAKE A 1/2 TABLET IN THE AFTERNOON TAKE A WHOLE TABLET AT BEDTIME     traZODone (DESYREL) 100 MG tablet Take one tab daily as needed for sleep 30 tablet 2   warfarin (COUMADIN) 5 MG tablet TAKE 1/2 TO 1 TABLET BY MOUTH EVERY DAY OR AS DIRECTED BY COUMADIN CLINIC 30 tablet 5   prednisoLONE Acetate-Nepafenac 1-0.1 % SUSP Apply to eye.     No facility-administered medications prior to visit.       Allergies:   Alprazolam, Bee venom, Iodine, Pseudoephedrine hcl er, Budesonide-formoterol fumarate, Crestor [rosuvastatin calcium], Esomeprazole magnesium, Flonase [fluticasone propionate], Lamictal [lamotrigine], Loratadine, Lotrimin [clotrimazole], Lunesta [eszopiclone], Oxcarbazepine, Statins, Zolpidem tartrate, Betadine [povidone iodine], Bevespi aerosphere [glycopyrrolate-formoterol], Clarithromycin, Effexor [venlafaxine], Lexapro [escitalopram oxalate], Aciphex [rabeprazole sodium], Alendronate sodium, Avelox [moxifloxacin hcl in nacl], Bextra [valdecoxib], Ceclor [cefaclor], Cephalexin, Covera-hs [verapamil hcl], Dicyclomine hcl, Other, and Tessalon perles   Social History   Socioeconomic History   Marital status: Divorced    Spouse name: Not on file   Number of children: 2   Years of education: Not on file   Highest education level: Not on file  Occupational History   Occupation: disability  Social Network engineer strain: Not on file   Food insecurity    Worry: Not on file    Inability: Not on file   Transportation needs    Medical: Not on file    Non-medical: Not on file  Tobacco Use   Smoking status: Former Smoker    Packs/day: 3.00    Years: 30.00    Pack years: 90.00    Types: Cigarettes    Quit date: 06/02/2010    Years since quitting: 9.1   Smokeless tobacco: Never Used   Tobacco comment: QUIT IN 2011  Substance and Sexual Activity   Alcohol use: No    Alcohol/week: 0.0 standard drinks   Drug use: No   Sexual activity: Yes    Partners: Male    Birth control/protection: Post-menopausal, Surgical  Lifestyle   Physical activity    Days per week: Not on file    Minutes per session: Not on file   Stress: Not on file  Relationships   Social connections    Talks on phone: Not on file    Gets together: Not on file    Attends religious service: Not on file    Active member of club or organization: Not on file    Attends meetings of  clubs or organizations: Not on file    Relationship status: Not on file  Other Topics Concern   Not on file  Social History Narrative   Marital status: divorced in 1978 after ten years; dating casually in 2019.      Children: 3 biological children; 3 court appointed children; 6 grandchildren; 5 gg      Lives: alone; children in [place] and [place].      Employment: retired age 17; disability for COPD, CVA at age 80.      Tobacco: former smoker; quit smoking 2011.      Alcohol: none      Exercise: walks dog four times per day; goes to pool three times per week.      ADLs: drives; no assistant devices; does have a walker.  Cleaning is limited in 2018.  Daughter helps with cleaning.  Does own grocery shopping.      Advanced Directives: YES; DNR/DNI; HCPOA: [name] Martin/daughter youngest.  Blind.                 Family History:  The patient's family history includes Alzheimer's disease in her mother; Dementia in her mother; Diabetes in her mother and sister; Heart attack in her father; Heart attack (age of onset: 15) in her brother; Schizophrenia in her sister; Tremor in her sister.   Review of Systems:   As noted in HPI.  All other systems reviewed and are otherwise negative except as noted above.   Physical Exam:    VS:  BP 126/83    Pulse 70    Temp (!) 97.2 F (36.2 C)    Ht 5\' 4"  (1.626 m)    Wt 194 lb 9.6 oz (88.3 kg)    SpO2 90%    BMI 33.40 kg/m    GENERAL:  Well appearing overweight WF in NAD HEENT:  PERRL, EOMI, sclera are clear. Oropharynx is clear. NECK:  No jugular venous distention, carotid upstroke brisk and symmetric, no bruits, no thyromegaly or adenopathy LUNGS:  Clear to auscultation bilaterally CHEST:  Unremarkable HEART:  RRR,  PMI not displaced or sustained,S1 and S2 within normal limits, no S3, no S4: no clicks, no rubs, no murmurs ABD:  Soft, nontender. BS +, no masses or bruits. No hepatomegaly, no splenomegaly EXT:  Poor pedal pulses, absent  left radial pulse.  no edema, no cyanosis no clubbing SKIN:  Warm and dry.  No rashes NEURO:  Alert and oriented x 3. Cranial nerves II through XII intact. PSYCH:  Cognitively intact    Wt Readings from Last 3 Encounters:  07/31/19 194 lb 9.6 oz (88.3 kg)  06/21/19 193 lb (87.5 kg)  06/09/19 194 lb (88 kg)     Studies/Labs Reviewed:   Recent Labs: 11/28/2018: ALT 9; TSH 2.460 05/18/2019: BUN 12; Creatinine, Ser 0.94; Hemoglobin 12.7; Platelets 251; Potassium 3.8; Sodium 138   Lipid Panel    Component Value Date/Time   CHOL 148 11/28/2018 0943   TRIG 146 11/28/2018 0943   HDL 35 (L) 11/28/2018 0943   CHOLHDL 4.2 11/28/2018 0943   CHOLHDL 4.6 07/02/2016 0823   VLDL 25 07/02/2016 0823   LDLCALC 84 11/28/2018 0943    Additional studies/ records that were reviewed today include:   Echocardiogram: 02/09/2017 Study Conclusions  - Left ventricle: The cavity size was mildly dilated. Wall   thickness was increased in a pattern of mild LVH. Systolic   function was severely reduced. The estimated ejection fraction   was in the range of 20% to 25%. Diffuse hypokinesis. Doppler   parameters are consistent with abnormal left ventricular   relaxation (grade 1 diastolic  dysfunction). - Aortic valve: Valve area (Vmax): 1.4 cm^2. - Aortic root: The aortic root was mildly dilated. - Ascending aorta: The ascending aorta was mildly dilated. - Mitral valve: Calcified annulus. There was moderate   regurgitation. - Left atrium: The atrium was moderately dilated. - Right ventricle: Systolic function was moderately reduced. - Pulmonary arteries: Systolic pressure was mildly increased. - Pericardium, extracardiac: A trivial pericardial effusion was   identified.  Impressions:  - No subcostal views; severe global reduction in LV systolic   function; grade 1 diastolic dysfunction; mildly dilated aortic   root and ascending aorta; moderate MR; moderaet LAE; moderately   reduced RV  function; mild TR; mildly elevated pulmonary pressure.  Procedures   Right/Left Heart Cath and Coronary Angiography  Conclusion     Ost 1st Mrg to 1st Mrg lesion, 75 %stenosed.  2nd Mrg lesion, 90 %stenosed.  Ost RCA to Mid RCA lesion, 100 %stenosed.  There is severe left ventricular systolic dysfunction.  LV end diastolic pressure is normal.  The left ventricular ejection fraction is 25-35% by visual estimate.  Hemodynamic findings consistent with mild pulmonary hypertension.  LV end diastolic pressure is normal.   1. Severe 2 vessel obstructive CAD    - 75% proximal OM1    - 90% small OM2    - 100% proximal RCA. Left to right collaterals.  2. Severe LV dysfunction 3. Mild pulmonary HTN with normal LV filling pressures.  4. Cardiac index 2.41 L/min/BSA   Plan: Medical management to try and optimize CHF therapy. Patient appears to be adequately diuresed at this time. Based on these results her cardiomyopathy is ischemic. I would treat her CAD medically. If cardiac cath is needed in the future would consider alternative access given difficulty from the right radial approach. Her rhythm during procedure is a multifocal atrial rhythm/tachycardia.     Echo 07/08/17: Study Conclusions  - Left ventricle: The cavity size was normal. There was moderate   concentric hypertrophy. Systolic function was normal. The   estimated ejection fraction was in the range of 50% to 55%.   Severe hypokinesis of the basal-midinferior myocardium;   consistent with infarction in the distribution of the right   coronary artery. Doppler parameters are consistent with abnormal   left ventricular relaxation (grade 1 diastolic dysfunction). - Mitral valve: Calcified annulus.  Impressions:  - Compared to April 2018 there is marked improvement in contraction   of all LV wall segemnts except for the inferior wall, which   remains severely hypokinetic. There is also marked reduction in   the  severity of mitral insufficiency.   Assessment:    1. Chronic diastolic heart failure (HCC)   2. PAF (paroxysmal atrial fibrillation) (HCC)   3. PAD (peripheral artery disease) (HCC)   4. Thoracic aortic aneurysm without rupture (HCC)   5. Dizziness      Plan:   In order of problems listed above:  1. Chronic Combined Systolic and Diastolic CHF/ New Cardiomyopathy - EF 25-30% in 2018 with ischemic cardiomyopathy. Not a candidate for revascularization. With medical management EF improved to 50-55% by Echo.  - she is euvolemic at this time. - continue BB, aldactone, and statin. ARB discontinued due to orthostatic dizziness and BP is stable. Will continue current therapy.   2. Paroxysmal Atrial Fibrillation/ multifocal atrial tachycardia - This patients CHA2DS2-VASc Score and unadjusted Ischemic Stroke Rate (% per year) is equal to 9.7 % stroke rate/year from a score of 6 (CHF, HTN, Female, Age, CVA (  2)). She denies any evidence of active bleeding. Continue Coumadin for anticoagulation. Check INR today.  3. HTN- controlled.   4. COPD- - followed by Pulmonology.   5. PFO - no plans for closure   6. PAD s/p covered stenting of left iliac. Stenosis in distal aorta and right femoral not amenable to percutaneous therapy. Left subclavian stenosis. Claudication is stable. Followed by Dr Allyson Sabal.  7. CAD. 100% RCA. 75% OM. Chronic stable angina class 1. Continue medical management.     I will follow up in 6 months.   Signed, Kaliq Lege Swaziland, MD  07/31/2019 9:05 AM    [facility] [address], [address] [place], [place] [address] Phone: [phone]

## 2019-07-31 ENCOUNTER — Ambulatory Visit (INDEPENDENT_AMBULATORY_CARE_PROVIDER_SITE_OTHER): Payer: Medicare Other | Admitting: Cardiology

## 2019-07-31 ENCOUNTER — Ambulatory Visit (INDEPENDENT_AMBULATORY_CARE_PROVIDER_SITE_OTHER): Payer: Medicare Other | Admitting: Pharmacist Clinician (PhC)/ Clinical Pharmacy Specialist

## 2019-07-31 ENCOUNTER — Encounter: Payer: Self-pay | Admitting: Cardiology

## 2019-07-31 ENCOUNTER — Other Ambulatory Visit: Payer: Self-pay

## 2019-07-31 VITALS — BP 126/83 | HR 70 | Temp 97.2°F | Ht 64.0 in | Wt 194.6 lb

## 2019-07-31 DIAGNOSIS — I739 Peripheral vascular disease, unspecified: Secondary | ICD-10-CM

## 2019-07-31 DIAGNOSIS — Z7901 Long term (current) use of anticoagulants: Secondary | ICD-10-CM

## 2019-07-31 DIAGNOSIS — I5032 Chronic diastolic (congestive) heart failure: Secondary | ICD-10-CM

## 2019-07-31 DIAGNOSIS — I480 Paroxysmal atrial fibrillation: Secondary | ICD-10-CM

## 2019-07-31 DIAGNOSIS — R42 Dizziness and giddiness: Secondary | ICD-10-CM

## 2019-07-31 DIAGNOSIS — I712 Thoracic aortic aneurysm, without rupture, unspecified: Secondary | ICD-10-CM

## 2019-07-31 LAB — POCT INR: INR: 3 (ref 2.0–3.0)

## 2019-08-02 ENCOUNTER — Ambulatory Visit: Payer: Medicare Other | Admitting: Orthopaedic Surgery

## 2019-08-07 ENCOUNTER — Other Ambulatory Visit: Payer: Self-pay | Admitting: *Deleted

## 2019-08-07 ENCOUNTER — Other Ambulatory Visit (HOSPITAL_COMMUNITY): Payer: Self-pay | Admitting: Cardiovascular Disease

## 2019-08-07 ENCOUNTER — Ambulatory Visit (HOSPITAL_COMMUNITY)
Admission: RE | Admit: 2019-08-07 | Discharge: 2019-08-07 | Disposition: A | Discharge status: Home / Self Care | Payer: Medicare Other | Source: Ambulatory Visit | Attending: Cardiovascular Disease | Admitting: Cardiovascular Disease

## 2019-08-07 ENCOUNTER — Other Ambulatory Visit: Payer: Self-pay

## 2019-08-07 DIAGNOSIS — I6523 Occlusion and stenosis of bilateral carotid arteries: Secondary | ICD-10-CM | POA: Insufficient documentation

## 2019-08-07 DIAGNOSIS — G458 Other transient cerebral ischemic attacks and related syndromes: Secondary | ICD-10-CM

## 2019-08-08 ENCOUNTER — Ambulatory Visit (INDEPENDENT_AMBULATORY_CARE_PROVIDER_SITE_OTHER): Payer: Medicare Other | Admitting: Orthopaedic Surgery

## 2019-08-08 ENCOUNTER — Encounter: Payer: Self-pay | Admitting: Orthopaedic Surgery

## 2019-08-08 DIAGNOSIS — G8929 Other chronic pain: Secondary | ICD-10-CM | POA: Diagnosis not present

## 2019-08-08 DIAGNOSIS — M25512 Pain in left shoulder: Secondary | ICD-10-CM | POA: Diagnosis not present

## 2019-08-08 NOTE — Progress Notes (Signed)
Office Visit Note   Patient: Laurie Horton           Date of Birth: 1946-02-15           MRN: [id number] Visit Date: 08/08/2019              Requested by: Laurie Lipps, MD [address] [place],  [place] [address] PCP: Laurie Lipps, MD   Assessment & Plan: Visit Diagnoses:  1. Chronic left shoulder pain     Plan: Adhesive capsulitis left shoulder.  Had good response to subacromial cortisone injection about 6 to 7 weeks ago.  Has not attended physical therapy related to COVID but would like to start now.  Will be on chronic Coumadin and has significant comorbidities that may preclude surgery or anesthesia.  Thus, try a course of physical therapy and repeat cortisone if necessary. Had a long discussion regarding her adhesive capsulitis and possible etiologies.  Also discussed the treatments of physical therapy or manipulation.  I would like to be as conservative as possible because of her comorbidities and proceed with a physical therapy now that she agrees.  We will hold on further cortisone at the moment.  Has history of fibromyalgia.  Follow-Up Instructions: Return if symptoms worsen or fail to improve.   Orders:  No orders of the defined types were placed in this encounter.  No orders of the defined types were placed in this encounter.     Procedures: No procedures performed   Clinical Data: No additional findings.   Subjective: Chief Complaint  Patient presents with   Left Shoulder - Follow-up  Patient presents today for a 7 week follow up on her left shoulder. She received a cortisone injection on 06/21/2019.  Definitely feeling better but still having some trouble and limited motion.  As previously mentioned Laurie Horton has a number of comorbidities which compromises her health.  She is on chronic warfarin.  HPI  Review of Systems   Objective: Vital Signs: There were no vitals taken for this visit.  Physical Exam Constitutional:      Appearance: She  is well-developed.  Eyes:     Pupils: Pupils are equal, round, and reactive to light.  Pulmonary:     Effort: Pulmonary effort is normal.  Skin:    General: Skin is warm and dry.  Neurological:     Mental Status: She is alert and oriented to person, place, and time.  Psychiatric:        Behavior: Behavior normal.     Ortho Exam awake alert and oriented x3.  Comfortable sitting.  Positive impingement.  Definitely has adhesive capsulitis of the left shoulder.  She was able to place her arm actively at least 95 200 degrees and flexion I could go a little bit further but it was uncomfortable.  Abduction is about 90 degrees.  No related neck pain.  Good grip and release.  Neurologically appeared to be intact  Specialty Comments:  No specialty comments available.  Imaging: Vas US Carotid  Result Date: 08/08/2019 Carotid Arterial Duplex Study Indications:       Patient c/o dizziness when getting up to quickly. She                    complains of numbness in the left arm.She denies any other                    cerebrovascular symptoms. Risk Factors:  Hypertension, hyperlipidemia, past history of smoking,                    coronary artery disease. Comparison Study:  In 08/2018, a carotid duplex showed a RICA velocity of 157/47                    cm/s and a LICA velocity of 157/47 cm/s. Performing Technologist: Dondra Prader RVT  Examination Guidelines: A complete evaluation includes B-mode imaging, spectral Doppler, color Doppler, and power Doppler as needed of all accessible portions of each vessel. Bilateral testing is considered an integral part of a complete examination. Limited examinations for reoccurring indications may be performed as noted.  Right Carotid Findings: +----------+-------+-------+--------+------------------------+-----------------+             PSV     EDV     Stenosis Plaque Description       Comments                       cm/s    cm/s                                                          +----------+-------+-------+--------+------------------------+-----------------+  CCA Prox   79      15                                        tortuous           +----------+-------+-------+--------+------------------------+-----------------+  CCA Distal 55      14      <50%     heterogenous             intimal                                                                          thickening         +----------+-------+-------+--------+------------------------+-----------------+  ICA Prox   84      20      1-39%    heterogenous and                                                                 irregular                                   +----------+-------+-------+--------+------------------------+-----------------+  ICA Mid    90      23                                                           +----------+-------+-------+--------+------------------------+-----------------+  ICA Distal 84      23                                                           +----------+-------+-------+--------+------------------------+-----------------+  ECA        101     11               heterogenous and                                                                 irregular                                   +----------+-------+-------+--------+------------------------+-----------------+ +----------+--------+-------+---------+-------------------+             PSV cm/s EDV cms Describe  Arm Pressure (mmHG)  +----------+--------+-------+---------+-------------------+  Subclavian 228              Turbulent 150                  +----------+--------+-------+---------+-------------------+ +---------+--------+--+--------+--+---------+  Vertebral PSV cm/s 82 EDV cm/s 27 Antegrade  +---------+--------+--+--------+--+---------+ Left Carotid Findings: +----------+--------+--------+--------+--------------------------+--------+             PSV cm/s EDV cm/s Stenosis Plaque Description         Comments   +----------+--------+--------+--------+--------------------------+--------+  CCA Prox   109      22                heterogenous                         +----------+--------+--------+--------+--------------------------+--------+  CCA Mid    81       23                heterogenous                         +----------+--------+--------+--------+--------------------------+--------+  CCA Distal 85       25       <50%     heterogenous                         +----------+--------+--------+--------+--------------------------+--------+  ICA Prox   156      31       40-59%   heterogenous and irregular           +----------+--------+--------+--------+--------------------------+--------+  ICA Mid    112      36                                                     +----------+--------+--------+--------+--------------------------+--------+  ICA Distal 90       30                                                     +----------+--------+--------+--------+--------------------------+--------+  ECA        225      59       >50%     heterogenous                         +----------+--------+--------+--------+--------------------------+--------+ +----------+--------+--------+----------+-------------------+             PSV cm/s EDV cm/s Describe   Arm Pressure (mmHG)  +----------+--------+--------+----------+-------------------+  Subclavian 79                monophasic 100                  +----------+--------+--------+----------+-------------------+ +---------+--------+--------+----------+  Vertebral PSV cm/s EDV cm/s Retrograde  +---------+--------+--------+----------+  Summary: Right Carotid: Velocities in the right ICA are consistent with a 1-39% stenosis.                Non-hemodynamically significant plaque <50% noted in the CCA. Left Carotid: Velocities in the left ICA are consistent with a 40-59% stenosis.               Non-hemodynamically significant plaque <50% noted in the CCA. The               ECA appears >50% stenosed.                 Left subclavian steal. The pressure on the left is 50 mmHg lower               than the right. The right brachial artery is triphasic; monophasic               on the left. Vertebrals:  Right vertebral artery demonstrates antegrade flow. Left vertebral              artery demonstrates retrograde flow. Subclavians: Right subclavian artery flow was disturbed. Monophasic flow in the              left. *See table(s) above for measurements and observations. Suggest follow up study in 12 months. Electronically signed by Tobias Alexander MD on 08/08/2019 at 6:09:43 AM.    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   Acute diastolic heart failure, NYHA class 1 (HCC) 02/08/2017   Fibromyalgia and chronic chest pain 02/08/2017   Atrial fibrillation with RVR (HCC) 02/08/2017   Acute heart failure (HCC) 02/08/2017   Rapid atrial fibrillation (HCC) 02/08/2017   Shortness of breath 02/08/2017   Moderate episode of recurrent major depressive disorder (HCC) 01/04/2017   Generalized anxiety disorder 01/04/2017   Pure hypercholesterolemia 12/28/2016   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   Acute blood loss anemia 08/03/2016   Carotid stenosis 04/02/2015   Long term current use of anticoagulant therapy 09/26/2014   PAF (paroxysmal atrial fibrillation) (HCC) 09/19/2014   Degenerative arthritis of lumbar spine 08/02/2014   Chronic respiratory failure with hypoxia (HCC) 06/27/2014    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 12/04/2013   Idiopathic scoliosis  and kyphoscoliosis 07/19/2013   Hyperglycemia, drug-induced 10/12/2011   Encounter for long-term (current) use of medications 08/21/2011   Thrush 07/24/2011   PULMONARY NODULE, LEFT LOWER LOBE 10/14/2009   PATENT FORAMEN OVALE 10/14/2009   Essential hypertension 06/04/2009   G E R D 06/04/2009   SNORING, HX OF 06/04/2009   Past Medical History:  Diagnosis Date   CHF (congestive heart failure) (HCC) 03/02/2017   EF 25-30% 2018   Complication of anesthesia    various issues with oxygen  saturations post op   COPD (chronic obstructive pulmonary disease) (HCC)    Depression    Diverticulitis    DVT (deep venous thrombosis) (HCC)    Fibromyalgia    GERD (gastroesophageal reflux disease)    Hiatal hernia    History of deviated nasal septum    left- side   HTN (hypertension)    Hyperlipidemia    Obesity    On supplemental oxygen therapy    concentrator at night @ 1.5 l/m or when sleeps. O2 Sat niormally 87.   Osteoporosis    PAT (paroxysmal atrial tachycardia) (HCC)    PFO (patent foramen ovale)    PULMONARY NODULE, LEFT LOWER LOBE 10/14/2009   5mm LLL nodule dec 2010. Stable and 4mm in Oct 2012. 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 Persists on cxr 07/22/11. Cleared on CT 08/24/11. No further followup   Stroke [facility])    TOBACCO ABUSE 06/04/2009    Family History  Problem Relation Age of Onset   Dementia Mother    Diabetes Mother    Alzheimer's disease Mother    Heart attack Brother 70   Heart attack Father    Schizophrenia Sister    Diabetes Sister    Tremor Sister     Past Surgical History:  Procedure Laterality Date   CARDIOVASCULAR STRESS TEST  12/26/2004   EF 74%. NO EVIDENCE OF ISCHEMIA   ESOPHAGOGASTRODUODENOSCOPY  (EGD) WITH PROPOFOL N/A 04/15/2015   Procedure: ESOPHAGOGASTRODUODENOSCOPY (EGD) WITH PROPOFOL;  Surgeon: Carman Ching, MD;  Location: [facility] ENDOSCOPY;  Service: Endoscopy;  Laterality: N/A;   KNEE ARTHROSCOPY  2000   left   LAPAROSCOPIC CHOLECYSTECTOMY  04-16-2010   [place]   LOWER EXTREMITY ANGIOGRAPHY N/A 09/09/2017   Procedure: Lower Extremity Angiography;  Surgeon: Runell Gess, MD;  Location: [facility] INVASIVE CV LAB;  Service: Cardiovascular;  Laterality: N/A;   LOWER EXTREMITY INTERVENTION Left 01/17/2018   Procedure: LOWER EXTREMITY INTERVENTION;  Surgeon: Runell Gess, MD;  Location: [facility] INVASIVE CV LAB;  Service: Cardiovascular;  Laterality: Left;   MOUTH SURGERY     03-26-15 multiple extractions stitches remains   PERIPHERAL VASCULAR INTERVENTION Left 01/17/2018   Procedure: PERIPHERAL VASCULAR INTERVENTION;  Surgeon: Runell Gess, MD;  Location: [facility] INVASIVE CV LAB;  Service: Cardiovascular;  Laterality: Left;  COMMON ILIAC   RIGHT/LEFT HEART CATH AND CORONARY ANGIOGRAPHY N/A 03/04/2017   Procedure: Right/Left Heart Cath and Coronary Angiography;  Surgeon: Karthikeya Funke M [place], MD;  Location: [facility] INVASIVE CV LAB;  Service: Cardiovascular;  Laterality: N/A;   TOTAL ABDOMINAL HYSTERECTOMY     post op needed oxygen was told "she gave them a scare"   TUBAL LIGATION     US ECHOCARDIOGRAPHY  11/20/2009   EF 55-60%   Social History   Occupational History   Occupation: disability  Tobacco Use   Smoking status: Former Smoker  Packs/day: 3.00    Years: 30.00    Pack years: 90.00    Types: Cigarettes    Quit date: 06/02/2010    Years since quitting: 9.1   Smokeless tobacco: Never Used   Tobacco comment: QUIT IN 2011  Substance and Sexual Activity   Alcohol use: No    Alcohol/week: 0.0 standard drinks   Drug use: No   Sexual activity: Yes    Partners: Male    Birth control/protection: Post-menopausal, Surgical

## 2019-08-08 NOTE — Addendum Note (Signed)
Addended by: Lendon Collar on: 08/08/2019 12:51 PM   Modules accepted: Orders

## 2019-08-15 ENCOUNTER — Other Ambulatory Visit: Payer: Self-pay | Admitting: Cardiovascular Disease

## 2019-08-21 ENCOUNTER — Encounter (HOSPITAL_COMMUNITY): Payer: Self-pay | Admitting: Psychiatry

## 2019-08-21 ENCOUNTER — Other Ambulatory Visit: Payer: Self-pay

## 2019-08-21 ENCOUNTER — Ambulatory Visit (INDEPENDENT_AMBULATORY_CARE_PROVIDER_SITE_OTHER): Payer: Medicare Other | Admitting: Psychiatry

## 2019-08-21 DIAGNOSIS — F331 Major depressive disorder, recurrent, moderate: Secondary | ICD-10-CM

## 2019-08-21 DIAGNOSIS — F411 Generalized anxiety disorder: Secondary | ICD-10-CM

## 2019-08-21 DIAGNOSIS — F4321 Adjustment disorder with depressed mood: Secondary | ICD-10-CM

## 2019-08-21 MED ORDER — DIAZEPAM 2 MG PO TABS: 2.0000 mg | ORAL_TABLET | Freq: Every day | ORAL | 0 refills | Status: DC | PRN

## 2019-08-21 MED ORDER — TRAZODONE HCL 100 MG PO TABS: ORAL_TABLET | ORAL | 2 refills | Status: DC

## 2019-08-21 MED ORDER — DESVENLAFAXINE SUCCINATE ER 50 MG PO TB24: 50.0000 mg | ORAL_TABLET | Freq: Every day | ORAL | 2 refills | Status: DC

## 2019-08-21 NOTE — Progress Notes (Signed)
Virtual Visit via Telephone Note  I connected with Laurie Horton on 08/21/19 at  8:20 AM EDT by telephone and verified that I am speaking with the correct person using two identifiers.   I discussed the limitations, risks, security and privacy concerns of performing an evaluation and management service by telephone and the availability of in person appointments. I also discussed with the patient that there may be a patient responsible charge related to this service. The patient expressed understanding and agreed to proceed.   History of Present Illness: Patient was evaluated by phone.  She has been having crying spells and very sad because her dog died last month.  Patient told she has a dog for past 14 years and she was very good companion.  She misses her dog a lot and sometimes she having crying spells and extreme sadness.  She also very concerned about her general physical health.  She has frozen shoulder and her pain has been gradually increased.  Patient told that it takes a lot of time to do anything even small tasks like changing dress or coming up and down on stairs.  She had.  Very worried about the future and her health.  She admitted there are times lately that she could not sleep very well.  She has to take Valium after the death of the dog.  She has limited support system.  She has 2 daughter oldest daughter is very busy and does not help as much and her youngest daughter is blind and she has a autistic child.  Patient told her youngest daughter's husband sometime comes in visit and bring groceries.  She really drives only when there is a need for it.  She is using oxygen because sometimes she gets shortness of breath.  Recently she had a blood work and her hemoglobin A1c is 6.3.  To try to walk in her backyard but does not walk for long..  She feels the current medicine is working as she does not have a lot of mood swings or severe irritability.  She understand her current condition is due  to the grief after losing her dog.  She denies any paranoia, hallucination or any suicidal thoughts or homicidal thoughts.  She admitted getting some time emotional when she walks use and she had decided not to watch news anymore.  She denies drinking or using any illegal substances.  Her appetite is fair.  Energy level is fair.  She has no tremors, shakes or any EPS.  She like to continue trazodone, Pristiq and Valium as needed.  Recently she seen her PCP and her medicines remain unchanged from the past.  She is taking gabapentin, hydrocodone, muscle relaxant which is provided by pain management Dr. Zannie Cove.  Past Psychiatric History:Reviewed. H/O depression and anxiety. No H/Oinpatient treatment, suicidal attempt, paranoia,mania andhallucination. Seeing psychiatrist in 2006 after a stroke. Tried Cymbalta, lexaproand Celexa in the past with limited response. Tried Ambien, Lunesta, lexapro, Lamictaland Xanax but developed allergies and side effects. We tried increasing Pristiq but she couldn't affordside effects.   Recent Results (from the past 2160 hour(s))  Hemoglobin A1c     Status: Abnormal   Collection Time: 06/14/19  9:06 AM  Result Value Ref Range   Hgb A1c MFr Bld 6.3 (H) 4.8 - 5.6 %    Comment:          Prediabetes: 5.7 - 6.4          Diabetes: >6.4  Glycemic control for adults with diabetes: <7.0    Est. average glucose Bld gHb Est-mCnc 134 mg/dL  POCT INR     Status: None   Collection Time: 07/31/19  9:06 AM  Result Value Ref Range   INR 3.0 2.0 - 3.0    Psychiatric Specialty Exam: Physical Exam  Review of Systems  Musculoskeletal: Positive for joint pain.  Psychiatric/Behavioral: Positive for depression. The patient is nervous/anxious.     There were no vitals taken for this visit.There is no height or weight on file to calculate BMI.  General Appearance: NA  Eye Contact:  NA  Speech:  Clear and Coherent and Slow  Volume:  Normal  Mood:  Depressed and  Dysphoric  Affect:  NA  Thought Process:  Descriptions of Associations: Intact  Orientation:  Full (Time, Place, and Person)  Thought Content:  Rumination  Suicidal Thoughts:  No  Homicidal Thoughts:  No  Memory:  Immediate;   Good Recent;   Good Remote;   Good  Judgement:  Good  Insight:  Fair  Psychomotor Activity:  NA  Concentration:  Concentration: Fair and Attention Span: Fair  Recall:  Good  Fund of Knowledge:  Good  Language:  Good  Akathisia:  No  Handed:  Right  AIMS (if indicated):     Assets:  Communication Skills Desire for Improvement Housing Resilience  ADL's:  Intact  Cognition:  WNL  Sleep:   fair     Assessment and Plan: Major depressive disorder, recurrent.  Generalized anxiety disorder.  Grief.  Discuss her recent loss of dog which she had for 14 years.  I recommended grief counseling but patient declined.  She started physical therapy for her frozen shoulder and hoping it will help her so she can mobilize more.  We have tried higher dose of Pristiq but she could not tolerate it.  She does not want to change medication but usually want to keep the current dose since she is tolerating this medicine better.  I encouraged if she can tolerate pain then she should walk 10 to 15 minutes 2-3 times a week.  I reviewed blood work results.  Her hemoglobin A1c is 6.3.  Discussed medication side effects and benefits.  Encouraged to talk to her younger daughter on a regular basis which she does when she is very lonely.  Continue Pristiq 50 mg daily, trazodone 100 mg at bedtime and Valium 2 mg as needed for severe anxiety.  We provided 10 tablets of Valium which usually last for 3 months.  Recommended to call us back if he has any question or any concern.  Follow-up in 3 months.    Follow Up Instructions:    I discussed the assessment and treatment plan with the patient. The patient was provided an opportunity to ask questions and all were answered. The patient agreed with  the plan and demonstrated an understanding of the instructions.   The patient was advised to call back or seek an in-person evaluation if the symptoms worsen or if the condition fails to improve as anticipated.  I provided 20 minutes of non-face-to-face time during this encounter.   Kathlee Nations, MD

## 2019-08-24 ENCOUNTER — Ambulatory Visit (HOSPITAL_COMMUNITY): Payer: Medicare Other | Admitting: Psychiatry

## 2019-09-11 ENCOUNTER — Other Ambulatory Visit: Payer: Self-pay

## 2019-09-11 ENCOUNTER — Ambulatory Visit (INDEPENDENT_AMBULATORY_CARE_PROVIDER_SITE_OTHER): Payer: Medicare Other | Admitting: Pharmacist

## 2019-09-11 DIAGNOSIS — Z7901 Long term (current) use of anticoagulants: Secondary | ICD-10-CM | POA: Diagnosis not present

## 2019-09-11 DIAGNOSIS — I480 Paroxysmal atrial fibrillation: Secondary | ICD-10-CM

## 2019-09-11 LAB — POCT INR: INR: 3.1 — AB (ref 2.0–3.0)

## 2019-09-15 ENCOUNTER — Other Ambulatory Visit: Payer: Self-pay

## 2019-09-15 ENCOUNTER — Ambulatory Visit (INDEPENDENT_AMBULATORY_CARE_PROVIDER_SITE_OTHER): Payer: Medicare Other | Admitting: Pulmonary Disease

## 2019-09-15 ENCOUNTER — Telehealth: Payer: Self-pay | Admitting: Internal Medicine

## 2019-09-15 ENCOUNTER — Encounter: Payer: Self-pay | Admitting: Pulmonary Disease

## 2019-09-15 ENCOUNTER — Encounter: Payer: Medicare Other | Admitting: Internal Medicine

## 2019-09-15 DIAGNOSIS — Z7901 Long term (current) use of anticoagulants: Secondary | ICD-10-CM

## 2019-09-15 DIAGNOSIS — I771 Stricture of artery: Secondary | ICD-10-CM | POA: Diagnosis not present

## 2019-09-15 DIAGNOSIS — I5022 Chronic systolic (congestive) heart failure: Secondary | ICD-10-CM | POA: Diagnosis not present

## 2019-09-15 DIAGNOSIS — Z889 Allergy status to unspecified drugs, medicaments and biological substances status: Secondary | ICD-10-CM | POA: Insufficient documentation

## 2019-09-15 DIAGNOSIS — J449 Chronic obstructive pulmonary disease, unspecified: Secondary | ICD-10-CM | POA: Diagnosis not present

## 2019-09-15 DIAGNOSIS — R0602 Shortness of breath: Secondary | ICD-10-CM

## 2019-09-15 DIAGNOSIS — J9611 Chronic respiratory failure with hypoxia: Secondary | ICD-10-CM | POA: Diagnosis not present

## 2019-09-15 MED ORDER — PREDNISONE 10 MG PO TABS: ORAL_TABLET | ORAL | 0 refills | Status: DC

## 2019-09-15 NOTE — Telephone Encounter (Signed)
Patient was rescheduled for a televisit with Wyn Quaker, NP. Patient did not answer any of the numbers that are listed in her chart.  We attempted to call patient 6 separate times. Either a busy signal or just a ring with no voicemail.

## 2019-09-15 NOTE — Assessment & Plan Note (Signed)
Patient with multiple drug allergies Over 30 allergies or intolerances to medications Patient prone to medication reactions Patient very limited in use of antibiotics Patient reports that she can tolerate prednisone despite her multiple allergies to contradict that  Plan: Patient can continue to take prophylactic antibiotics as managed by her dentist Could consider course of Biaxin in the future if patient flares or gets worse Prednisone taper today Patient reports that she has tolerated prednisone tapers at this amount in the past with no issues except for some stomach cramping

## 2019-09-15 NOTE — Telephone Encounter (Signed)
Pt scheduled for a televisit today with MR. Attempted to call pt at preferred number x3 but each time the line rang busy. Attempted to call pt x3 at other number listed and the line rang multiple times and then went to a busy tone. Unable to leave a VM.

## 2019-09-15 NOTE — Progress Notes (Signed)
Pt scheduled for a televisit today with MR. Attempted to call pt at preferred number x3 but each time the line rang busy. Attempted to call pt x3 at other number listed and the line rang multiple times and then went to a busy tone. Unable to leave a VM.

## 2019-09-15 NOTE — Progress Notes (Signed)
 This encounter was created in error - please disregard.

## 2019-09-15 NOTE — Progress Notes (Signed)
Virtual Visit via Telephone Note  I connected with Laurie Horton on 09/15/19 at  1:30 PM EST by telephone and verified that I am speaking with the correct person using two identifiers.  Location: Patient: Home Provider: [facility] - [id number], [place], [place], [place] [address]   I discussed the limitations, risks, security and privacy concerns of performing an evaluation and management service by telephone and the availability of in person appointments. I also discussed with the patient that there may be a patient responsible charge related to this service. The patient expressed understanding and agreed to proceed.  Patient consented to consult via telephone: Yes People present and their role in pt care: Pt   History of Present Illness:  73 year old female former smoker followed in our office for COPD, LLL pulm nodule   PMH: HTN, CHF, Afib (coumadin), Scoliosis, Anxiety Smoker/ Smoking History: Former Smoker. 90 pack years.  Maintenance:  None  Pt of: Dr. [name]  Chief complaint: Increased shortness of breath  73 year old female followed in our office for COPD.  Patient is intolerant of maintenance inhalers.  She has Atrovent nebulized meds which she can utilize if she is short of breath or wheezing.  Patient was originally scheduled earlier today with Dr. [name].  She was unable to be reached after multiple times.  Patient was rescheduled with myself this morning.  Patient was also unable to be reached after multiple attempts.  Patient was then rescheduled at 1:30 PM.  Due to her telephone issues we were disconnected over 5 times.  There are multiple attempts to get back in contact with the patient.  Patient is going to follow-up with her [employer].  Thankfully were finally able to connect with the patient.  She is reporting that for the last 2 days she is had increased shortness of breath, wheezing, occasional chills.  She has not had any  fevers.  She denies lower extremity swelling.  Unfortunately she is not weighing herself daily.  She is currently undergoing multiple dental procedures to remove her teeth one by one due to her being maintained on Coumadin.  She is having all of her upper teeth removed in preparation for dentures.  She currently is planning on having a tooth removal next week.  She reports there are 3 more left ago.  She is currently taking prophylactic antibiotics for this.  She recently had corticosteroid injections 2 weeks ago for trigger points.   Observations/Objective:  09/15/2019 - temp -97.3  05/26/2019-CTA chest-severe obstructive disease in the proximal left subclavian artery, high-grade stenosis near the origin is followed by long segment of heavily calcified plaque where the artery is suspected to be subtotally occluded or completely occluded up to the left vertebral origin, dilated aortic root measuring up to 4.9 cm, severe emphysema, cirrhosis of liver, coronary arthrosclerosis   01/21/2016-CT chest without contrast- moderate centrilobular emphysema and diffuse bronchial wall thickening mild subpleural density in the dependent lower lobes  01/20/2016-pulmonary function test- airway obstruction and diffusion defect suggesting emphysema  Imaging:  02/11/2017-chest x-ray-stable large cardiac silhouette, lungs are hyperinflated, interstitial edema pattern unchanged from prior scans  Cardiac:  07/08/2017-echocardiogram-LV ejection fraction 50 to [id number], grade 1 diastolic dysfunction  Assessment and Plan:  Chronic systolic CHF (congestive heart failure) (HCC) Plan:  Continue follow-up with cardiology Please start reweighing yourself Continue fluid pills as prescribed    Subclavian artery stenosis, left ([place]) Plan: Continue follow-up with cardiology Continue to follow-up with vein  and vascular  Chronic respiratory failure with hypoxia (HCC) Plan: Continue oxygen therapy as prescribed  COPD, severe  ([place]) Plan: Continue Atrovent nebulized meds as prescribed Prednisone taper today No fevers, patient taking prophylactic antibiotics for dental procedure Follow-up with Dr. [name] in 2 months  Long term current use of anticoagulant therapy Plan: Continue to follow-up with cardiology Continue Coumadin  Shortness of breath Potential COPD exacerbation today  Plan: Prednisone taper today Patient intolerant of maintenance inhalers Can use Atrovent nebulized meds  Emphasized the patient that if symptoms worsen she will need to present to an emergency room for further evaluation or an urgent care.  Multiple drug allergies Patient with multiple drug allergies Over 30 allergies or intolerances to medications Patient prone to medication reactions Patient very limited in use of antibiotics Patient reports that she can tolerate prednisone despite her multiple allergies to contradict that  Plan: Patient can continue to take prophylactic antibiotics as managed by her dentist Could consider course of Biaxin in the future if patient flares or gets worse Prednisone taper today Patient reports that she has tolerated prednisone tapers at this amount in the past with no issues except for some stomach cramping    Follow Up Instructions:  Return in about 2 months (around 11/15/2019), or if symptoms worsen or fail to improve, for Follow up with Dr. Purnell Shoemaker.   I discussed the assessment and treatment plan with the patient. The patient was provided an opportunity to ask questions and all were answered. The patient agreed with the plan and demonstrated an understanding of the instructions.   The patient was advised to call back or seek an in-person evaluation if the symptoms worsen or if the condition fails to improve as anticipated.  I provided 50 minutes of non-face-to-face time during this encounter.   Lauraine Rinne, NP

## 2019-09-15 NOTE — Assessment & Plan Note (Signed)
Plan: Continue Atrovent nebulized meds as prescribed Prednisone taper today No fevers, patient taking prophylactic antibiotics for dental procedure Follow-up with Dr. [name] in 2 months

## 2019-09-15 NOTE — Assessment & Plan Note (Signed)
Plan: Continue follow-up with cardiology Continue to follow-up with vein and vascular

## 2019-09-15 NOTE — Assessment & Plan Note (Signed)
Plan: Continue to follow-up with cardiology Continue Coumadin

## 2019-09-15 NOTE — Assessment & Plan Note (Signed)
Plan:  Continue follow-up with cardiology Please start reweighing yourself Continue fluid pills as prescribed

## 2019-09-15 NOTE — Patient Instructions (Signed)
You were seen today by Lauraine Rinne, NP  for:  1. Chronic systolic CHF (congestive heart failure) (HCC) 2. Subclavian artery stenosis, left (HCC)  Continue your fluid pills Start reweighing yourself daily Follow-up with cardiology if your weights are elevated Continue Coumadin Continue cardiology medications  Patient Education for Congestive Heart Failure  Do the following things EVERY DAY:   1. Weigh yourself EVERY morning after you go to the bathroom but before you eat or drink anything. Write this number down in a weight log/diary.    2. Take your medicines as prescribed. If you have concerns about your medications, please call us before you stop taking them.    3. Eat low salt foods-Limit salt (sodium) to 2000 mg per day. This will help prevent your body from holding onto fluid. Read food labels as many processed foods have a lot of sodium, especially canned goods and prepackaged meats. If you would like some assistance choosing low sodium foods, we would be happy to set you up with a nutritionist.   4. Stay as active as you can everyday. Staying active will give you more energy and make your muscles stronger. Start with 5 minutes at a time and work your way up to 30 minutes a day. Break up your activities--do some in the morning and some in the afternoon. Start with 3 days per week and work your way up to 5 days as you can.  If you have chest pain, feel short of breath, dizzy, or lightheaded, STOP. If you don't feel better after a short rest, call 911. If you do feel better, call the office to let us know you have symptoms with exercise.   5. Limit all fluids for the day to less than 2 liters. Fluid includes all drinks, coffee, juice, ice chips, soup, jello, and all other liquids.   3. COPD, severe (HCC)  - predniSONE (DELTASONE) 10 MG tablet; 4 tabs for 2 days, then 3 tabs for 2 days, 2 tabs for 2 days, then 1 tab for 2 days, then stop  Dispense: 20 tablet; Refill: 0  4. Chronic  respiratory failure with hypoxia (HCC)  Continue oxygen therapy as prescribed  >>>maintain oxygen saturations greater than 88 percent  >>>if unable to maintain oxygen saturations please contact the office  >>>do not smoke with oxygen  >>>can use nasal saline gel or nasal saline rinses to moisturize nose if oxygen causes dryness  5. Long term current use of anticoagulant therapy  Continue Coumadin continue follow-up with cardiology  6. Shortness of breath  We will treat you as a COPD exacerbation today.  If your shortness of breath worsens despite these interventions that you will need to be evaluated at an urgent care or emergency room for further evaluation.  7. Multiple drug allergies  As discussed on telephone today.  You stated that you can tolerate prednisone despite the multiple drug allergies that we have listed.  If you start to have worsening shortness of breath, throat tightening up, increased cough please seek emergent evaluation at an emergency room   We recommend today:  No orders of the defined types were placed in this encounter.  No orders of the defined types were placed in this encounter.  Meds ordered this encounter  Medications  . predniSONE (DELTASONE) 10 MG tablet    Sig: 4 tabs for 2 days, then 3 tabs for 2 days, 2 tabs for 2 days, then 1 tab for 2 days, then stop  Dispense:  20 tablet    Refill:  0    Follow Up:    Return in about 2 months (around 11/15/2019), or if symptoms worsen or fail to improve, for Follow up with Dr. Purnell Shoemaker.   Please do your part to reduce the spread of COVID-19:      Reduce your risk of any infection  and COVID19 by using the similar precautions used for avoiding the common cold or flu:  Marland Kitchen Wash your hands often with soap and warm water for at least 20 seconds.  If soap and water are not readily available, use an alcohol-based hand sanitizer with at least 60% alcohol.  . If coughing or sneezing, cover your mouth and  nose by coughing or sneezing into the elbow areas of your shirt or coat, into a tissue or into your sleeve (not your hands). Langley Gauss A MASK when in public  . Avoid shaking hands with others and consider head nods or verbal greetings only. . Avoid touching your eyes, nose, or mouth with unwashed hands.  . Avoid close contact with people who are sick. . Avoid places or events with large numbers of people in one location, like concerts or sporting events. . If you have some symptoms but not all symptoms, continue to monitor at home and seek medical attention if your symptoms worsen. . If you are having a medical emergency, call 911.   [place] / e-Visit: [email or web address]         [facility]: [facility]: [id number]                   [facility]: [id number]     It is flu season:   >>> Best ways to protect herself from the flu: Receive the yearly flu vaccine, practice good hand hygiene washing with soap and also using hand sanitizer when available, eat a nutritious meals, get adequate rest, hydrate appropriately   Please contact the office if your symptoms worsen or you have concerns that you are not improving.   Thank you for choosing [facility] for your healthcare, and for allowing [place] to partner with you on your healthcare journey. I am thankful to be able to provide care to you today.   Wyn Quaker FNP-C

## 2019-09-15 NOTE — Assessment & Plan Note (Addendum)
Potential COPD exacerbation today  Plan: Prednisone taper today Patient intolerant of maintenance inhalers Can use Atrovent nebulized meds  Emphasized the patient that if symptoms worsen she will need to present to an emergency room for further evaluation or an urgent care.

## 2019-09-15 NOTE — Assessment & Plan Note (Signed)
Plan: Continue oxygen therapy as prescribed 

## 2019-10-02 ENCOUNTER — Other Ambulatory Visit: Payer: Self-pay

## 2019-10-02 MED ORDER — FUROSEMIDE 40 MG PO TABS: 40.0000 mg | ORAL_TABLET | Freq: Every day | ORAL | 3 refills | Status: DC

## 2019-10-11 ENCOUNTER — Ambulatory Visit (INDEPENDENT_AMBULATORY_CARE_PROVIDER_SITE_OTHER): Payer: Medicare Other | Admitting: Pharmacist

## 2019-10-11 ENCOUNTER — Other Ambulatory Visit: Payer: Self-pay

## 2019-10-11 DIAGNOSIS — I480 Paroxysmal atrial fibrillation: Secondary | ICD-10-CM | POA: Diagnosis not present

## 2019-10-11 DIAGNOSIS — Z7901 Long term (current) use of anticoagulants: Secondary | ICD-10-CM | POA: Diagnosis not present

## 2019-10-11 LAB — POCT INR: INR: 3.9 — AB (ref 2.0–3.0)

## 2019-10-17 ENCOUNTER — Other Ambulatory Visit: Payer: Self-pay

## 2019-10-17 DIAGNOSIS — J9611 Chronic respiratory failure with hypoxia: Secondary | ICD-10-CM

## 2019-10-17 MED ORDER — IPRATROPIUM BROMIDE 0.02 % IN SOLN: RESPIRATORY_TRACT | 3 refills | Status: DC

## 2019-10-19 ENCOUNTER — Telehealth: Payer: Self-pay | Admitting: Internal Medicine

## 2019-10-19 DIAGNOSIS — J9611 Chronic respiratory failure with hypoxia: Secondary | ICD-10-CM

## 2019-10-19 MED ORDER — CLARITHROMYCIN 500 MG PO TABS: 500.0000 mg | ORAL_TABLET | Freq: Two times a day (BID) | ORAL | 0 refills | Status: DC

## 2019-10-19 MED ORDER — PREDNISONE 10 MG PO TABS: ORAL_TABLET | ORAL | 0 refills | Status: DC

## 2019-10-19 NOTE — Telephone Encounter (Signed)
Called and spoke to pt. Pt c/o prod cough with yellow mucus. Pt states she had slightly yellow mucus with her cough when she did virtual visit with Wyn Quaker, NP in mid November. Pt states the mucus is more discolored and thicker now. Pt ws given pred taper, pt states this helped a little but after completion she began to feel worse again. Pt also c/o increase in SOB, chest tightness upon inhalation, mild dizziness. Pt denies f/c/s and body aches. Pt was not in distress over the phone. She was able to complete full sentences and was in good spirits. Pt denied a virtual visit at this time, she is requesting to hear MRs recs before visit is scheduled.   Dr. [name] please advise. Thanks.

## 2019-10-19 NOTE — Telephone Encounter (Signed)
Called and spoke to pt. Informed her of the recs per MR. Pt states Biaxin has worked well for her in the past. Pt also needs a refill of Atrovent when sending in the other medications[facility] [address]   MR please advise on Biaxin. Thanks.

## 2019-10-19 NOTE — Telephone Encounter (Signed)
  Repeat prednisone - Please take prednisone 40 mg x1 day, then 30 mg x1 day, then 20 mg x1 day, then 10 mg x1 day, and then 5 mg x1 day and stop  Go to ER if worse  Can she do a 5d antibiotic? If so she should tell us which one given many allergies  Allergies  Allergen Reactions  . Alprazolam Anaphylaxis and Other (See Comments)    REACTION: stops breathing  . Bee Venom Anaphylaxis  . Iodine Anaphylaxis, Swelling and Other (See Comments)    REACTION: swelling in throat  . Pseudoephedrine Hcl Er Shortness Of Breath  . Budesonide-Formoterol Fumarate Other (See Comments)    Blisters inside of mouth all over  . Crestor [Rosuvastatin Calcium] Other (See Comments)    Unable to walk  . Esomeprazole Magnesium Other (See Comments)    REACTION: "bouncing off walls"  . Flonase [Fluticasone Propionate] Other (See Comments)    NOSE BLEED  . Lamictal [Lamotrigine] Rash    Patient got rash, labored breathing, and diarrhea  . Loratadine Other (See Comments)    claritin D causes shaking  . Lotrimin [Clotrimazole] Other (See Comments)    Mouth blisters  . Lunesta [Eszopiclone] Other (See Comments)    REACTION: "slept for a week"  . Oxcarbazepine Other (See Comments)    Causes deep sleep and dizziness  . Statins Other (See Comments)    Can't walk, legs won't work   . Zolpidem Tartrate Other (See Comments)    REACTION: "slept for a week"  . Betadine [Povidone Iodine] Other (See Comments)    Breathing problems  . Bevespi Aerosphere [Glycopyrrolate-Formoterol] Other (See Comments)    Pt believes this caused mouth sores and thrush   . Clarithromycin Other (See Comments)    All "mycins", Puts into "a" fib, Will take if has to for severe sinus infection  . Effexor [Venlafaxine] Nausea And Vomiting and Other (See Comments)    cramps  . Lexapro [Escitalopram Oxalate] Other (See Comments)    hallucinations  . Aciphex [Rabeprazole Sodium] Rash  . Alendronate Sodium Other (See Comments)    "caused  stomach problems for 3 days"  . Avelox [Moxifloxacin Hcl In Nacl] Other (See Comments)    Stomach cramps.   Darlin Coco [Valdecoxib] Rash  . Ceclor [Cefaclor] Rash  . Cephalexin Rash and Other (See Comments)    Pt states that she is possibly allergic to this - had a reaction to Cefaclor in the past and she does not want to these class drugs. Added per patient request.  . Covera-Hs [Verapamil Hcl] Palpitations  . Dicyclomine Hcl Rash  . Other Other (See Comments)    Glue from ekg/heart monitor leads --rash, Any MYCINS  . Tessalon Perles Rash

## 2019-10-19 NOTE — Telephone Encounter (Signed)
Biaxin 500mg  po bid x 5 days for aecopd  Ok to refill atrovent

## 2019-10-19 NOTE — Telephone Encounter (Signed)
Called and spoke to pt. Informed her of the Biaxin. Pred and Biaxin sent to preferred pharmacy. Atrovent was actually already sent to pharmacy on 12/15, informed pt of this. Pt verbalized understanding and denied any further questions or concerns at this time.

## 2019-10-20 ENCOUNTER — Other Ambulatory Visit: Payer: Self-pay

## 2019-10-20 ENCOUNTER — Other Ambulatory Visit: Payer: Self-pay | Admitting: Cardiovascular Disease

## 2019-10-20 ENCOUNTER — Ambulatory Visit (INDEPENDENT_AMBULATORY_CARE_PROVIDER_SITE_OTHER): Payer: Medicare Other | Admitting: Pharmacist

## 2019-10-20 DIAGNOSIS — Z7901 Long term (current) use of anticoagulants: Secondary | ICD-10-CM | POA: Diagnosis not present

## 2019-10-20 DIAGNOSIS — I480 Paroxysmal atrial fibrillation: Secondary | ICD-10-CM

## 2019-10-20 LAB — POCT INR: INR: 3.2 — AB (ref 2.0–3.0)

## 2019-10-20 MED ORDER — WARFARIN SODIUM 5 MG PO TABS: ORAL_TABLET | ORAL | 5 refills | Status: DC

## 2019-10-31 ENCOUNTER — Other Ambulatory Visit: Payer: Self-pay | Admitting: Family Medicine

## 2019-10-31 NOTE — Telephone Encounter (Signed)
Requested medication (s) are due for refill today- yes  Requested medication (s) are on the active medication list -yes  Future visit scheduled -yes  Last refill: 07/24/2013  Notes to clinic: Patient is requesting refill of historical medication- sent for provider review  Requested Prescriptions  Pending Prescriptions Disp Refills   pantoprazole (PROTONIX) 40 MG tablet [Pharmacy Med Name: PANTOPRAZOLE SOD DR 40 MG TAB] 30 tablet 11    Sig: TAKE 1 TABLET BY MOUTH EVERY DAY      Gastroenterology: Proton Pump Inhibitors Passed - 10/31/2019  3:58 PM      Passed - Valid encounter within last 12 months    Recent Outpatient Visits           4 months ago Hyperglycemia, drug-induced   Primary Care at [facility], Arlie Solomons, MD   5 months ago Candidal intertrigo   [facility] at [facility], Lilia Argue, MD   11 months ago Hyperglycemia, drug-induced   [facility] at [facility], Lilia Argue, MD   1 year ago Medicare annual wellness visit, subsequent   Primary Care at [facility], Renette Butters, MD   2 years ago Essential hypertension   Primary Care at [facility], Renette Butters, MD       Future Appointments             In 1 week Durward Fortes Vonna Kotyk, MD [place]   In 1 month Rutherford Guys, MD Primary Care at [place], [facility]                Requested Prescriptions  Pending Prescriptions Disp Refills   pantoprazole (PROTONIX) 40 MG tablet [Pharmacy Med Name: PANTOPRAZOLE SOD DR 40 MG TAB] 30 tablet 11    Sig: TAKE [place]      Gastroenterology: Proton Pump Inhibitors Passed - 10/31/2019  3:58 PM      Passed - Valid encounter within last 12 months    Recent Outpatient Visits           4 months ago Hyperglycemia, drug-induced   Primary Care at [facility], Arlie Solomons, MD   5 months ago Candidal intertrigo   [facility] at [facility], Lilia Argue, MD   11 months ago Hyperglycemia, drug-induced   [facility] at [facility], Lilia Argue, MD    1 year ago Medicare annual wellness visit, subsequent   Primary Care at [facility], Renette Butters, MD   2 years ago Essential hypertension   Primary Care at [facility], Renette Butters, MD       Future Appointments             In 1 week Durward Fortes Vonna Kotyk, MD [place]   In 1 month Rutherford Guys, MD Primary Care at [place], [facility]

## 2019-11-01 ENCOUNTER — Telehealth: Payer: Self-pay | Admitting: Family Medicine

## 2019-11-01 ENCOUNTER — Other Ambulatory Visit: Payer: Self-pay | Admitting: Emergency Medicine

## 2019-11-01 NOTE — Telephone Encounter (Signed)
Is it ok to fill this med. I seems like she have an allergy to similar meds. I also do not see where we have filled this medication for patient

## 2019-11-01 NOTE — Telephone Encounter (Signed)
Pt would like a refill on her pantoprazole (PROTONIX) 40 MG tablet [Pharmacy Med Name: PANTOPRAZOLE SOD DR 40 MG TAB] [id number]  [id number] [address]  [place], [place] [address]  Phone:  [phone] Fax:  [phone]. She has an upcoming appointment on 11/07/19.

## 2019-11-02 MED ORDER — PANTOPRAZOLE SODIUM 40 MG PO TBEC: 40.0000 mg | DELAYED_RELEASE_TABLET | Freq: Every day | ORAL | 0 refills | Status: DC

## 2019-11-07 ENCOUNTER — Telehealth (INDEPENDENT_AMBULATORY_CARE_PROVIDER_SITE_OTHER): Payer: Medicare Other | Admitting: Family Medicine

## 2019-11-07 ENCOUNTER — Other Ambulatory Visit: Payer: Self-pay

## 2019-11-07 ENCOUNTER — Ambulatory Visit (INDEPENDENT_AMBULATORY_CARE_PROVIDER_SITE_OTHER): Payer: Medicare Other | Admitting: Pharmacist Clinician (PhC)/ Clinical Pharmacy Specialist

## 2019-11-07 DIAGNOSIS — I10 Essential (primary) hypertension: Secondary | ICD-10-CM

## 2019-11-07 DIAGNOSIS — I480 Paroxysmal atrial fibrillation: Secondary | ICD-10-CM | POA: Diagnosis not present

## 2019-11-07 DIAGNOSIS — E559 Vitamin D deficiency, unspecified: Secondary | ICD-10-CM

## 2019-11-07 DIAGNOSIS — Z7901 Long term (current) use of anticoagulants: Secondary | ICD-10-CM | POA: Diagnosis not present

## 2019-11-07 DIAGNOSIS — R739 Hyperglycemia, unspecified: Secondary | ICD-10-CM

## 2019-11-07 DIAGNOSIS — E7800 Pure hypercholesterolemia, unspecified: Secondary | ICD-10-CM | POA: Diagnosis not present

## 2019-11-07 DIAGNOSIS — T50905A Adverse effect of unspecified drugs, medicaments and biological substances, initial encounter: Secondary | ICD-10-CM

## 2019-11-07 DIAGNOSIS — R197 Diarrhea, unspecified: Secondary | ICD-10-CM

## 2019-11-07 LAB — POCT INR: INR: 1.5 — AB (ref 2.0–3.0)

## 2019-11-07 MED ORDER — PANTOPRAZOLE SODIUM 40 MG PO TBEC: 40.0000 mg | DELAYED_RELEASE_TABLET | Freq: Every day | ORAL | 1 refills | Status: DC

## 2019-11-07 NOTE — Progress Notes (Signed)
Virtual Visit Note  I connected with patient on 11/07/19 at 240pm by phone and verified that I am speaking with the correct person using two identifiers. Laurie Horton is currently located at home and patient is currently with them during visit. The provider, Rutherford Guys, MD is located in their office at time of visit.  I discussed the limitations, risks, security and privacy concerns of performing an evaluation and management service by telephone and the availability of in person appointments. I also discussed with the patient that there may be a patient responsible charge related to this service. The patient expressed understanding and agreed to proceed.   CC: diarrhea  HPI ? Today had sudden onset of abd cramping and diarrhea No fever or chills She has realized it was related to jalapeno peppers in her sandwich  Her BP this morning 132/78 Weight today 187 lbs  Sees cards for coumadin mgt Has seen pulm recently for COPD exacerbation, required pred x 2, next appt June 2021 She uses oxygen as needed.   Reports she has a history of vitamin D deficiency Used to have levels checked by rheum  Needs refill of pantoprazole for GERD Takes daily, gerd well controlled Has hiatal hernia  Reports depression is well controlled  She has otherwise been doing well  Lab Results  Component Value Date   HGBA1C 6.3 (H) 06/14/2019   HGBA1C 6.4 (A) 11/28/2018   HGBA1C 6.3 (H) 04/13/2018   Lab Results  Component Value Date   LDLCALC 84 11/28/2018   CREATININE 0.94 05/18/2019   Lab Results  Component Value Date   WBC 7.4 05/18/2019   HGB 12.7 05/18/2019   HCT 38.0 05/18/2019   MCV 89 05/18/2019   PLT 251 05/18/2019    Allergies  Allergen Reactions  . Alprazolam Anaphylaxis and Other (See Comments)    REACTION: stops breathing  . Bee Venom Anaphylaxis  . Iodine Anaphylaxis, Swelling and Other (See Comments)    REACTION: swelling in throat  . Pseudoephedrine Hcl Er  Shortness Of Breath  . Budesonide-Formoterol Fumarate Other (See Comments)    Blisters inside of mouth all over  . Crestor [Rosuvastatin Calcium] Other (See Comments)    Unable to walk  . Esomeprazole Magnesium Other (See Comments)    REACTION: "bouncing off walls"  . Flonase [Fluticasone Propionate] Other (See Comments)    NOSE BLEED  . Lamictal [Lamotrigine] Rash    Patient got rash, labored breathing, and diarrhea  . Loratadine Other (See Comments)    claritin D causes shaking  . Lotrimin [Clotrimazole] Other (See Comments)    Mouth blisters  . Lunesta [Eszopiclone] Other (See Comments)    REACTION: "slept for a week"  . Oxcarbazepine Other (See Comments)    Causes deep sleep and dizziness  . Statins Other (See Comments)    Can't walk, legs won't work   . Zolpidem Tartrate Other (See Comments)    REACTION: "slept for a week"  . Betadine [Povidone Iodine] Other (See Comments)    Breathing problems  . Bevespi Aerosphere [Glycopyrrolate-Formoterol] Other (See Comments)    Pt believes this caused mouth sores and thrush   . Clarithromycin Other (See Comments)    All "mycins", Puts into "a" fib, Will take if has to for severe sinus infection  . Effexor [Venlafaxine] Nausea And Vomiting and Other (See Comments)    cramps  . Lexapro [Escitalopram Oxalate] Other (See Comments)    hallucinations  . Aciphex [Rabeprazole Sodium] Rash  .  Alendronate Sodium Other (See Comments)    "caused stomach problems for 3 days"  . Avelox [Moxifloxacin Hcl In Nacl] Other (See Comments)    Stomach cramps.   Darlin Coco [Valdecoxib] Rash  . Ceclor [Cefaclor] Rash  . Cephalexin Rash and Other (See Comments)    Pt states that she is possibly allergic to this - had a reaction to Cefaclor in the past and she does not want to these class drugs. Added per patient request.  . Covera-Hs [Verapamil Hcl] Palpitations  . Dicyclomine Hcl Rash  . Other Other (See Comments)    Glue from ekg/heart monitor leads  --rash, Any MYCINS  . Tessalon Perles Rash    Prior to Admission medications   Medication Sig Start Date End Date Taking? Authorizing Provider  carvedilol (COREG) 12.5 MG tablet Take 1 tablet (12.5 mg total) by mouth 2 (two) times daily. 06/15/19 06/09/20  Lorretta Harp, MD  desvenlafaxine (PRISTIQ) 50 MG 24 hr tablet Take 1 tablet (50 mg total) by mouth daily. 08/21/19   Arfeen, Arlyce Harman, MD  diazepam (VALIUM) 2 MG tablet Take 1 tablet (2 mg total) by mouth daily as needed for anxiety. 08/21/19   Arfeen, Arlyce Harman, MD  diclofenac sodium (VOLTAREN) 1 % GEL APPLY 2 GRAMS TO BOTH HANDS 4 TIMES A DAY 06/21/18   [provider]  Eyelid Cleansers (OCUSOFT BABY EYELID & EYELASH EX) Apply topically.    [provider]  furosemide (LASIX) 40 MG tablet Take 1 tablet (40 mg total) by mouth daily. 10/02/19   Martinique, Peter M, MD  gabapentin (NEURONTIN) 100 MG capsule Take 100-200 mg by mouth See admin instructions. 100 mg in am, 200 mg at noon and  200 mg at bedtime 09/16/16   [provider]  HYDROcodone-acetaminophen (NORCO/VICODIN) 5-325 MG tablet Take 1 tablet by mouth 3 (three) times daily.     [provider]  ipratropium (ATROVENT) 0.02 % nebulizer solution USE 1 VIAL BY NEBULIZATION 4 (FOUR) TIMES DAILY. 10/17/19   Brand Males, MD  montelukast (SINGULAIR) 10 MG tablet TAKE 1 TABLET BY MOUTH EVERYDAY AT BEDTIME 05/18/19   Rutherford Guys, MD  ondansetron (ZOFRAN-ODT) 8 MG disintegrating tablet TAKE 1 TABLET BY MOUTH EVERY 8 HOURS AS NEEDED NAUSEA OR VOMITING 02/09/19   Rutherford Guys, MD  oxybutynin (DITROPAN-XL) 10 MG 24 hr tablet TAKE 1 TABLET BY MOUTH EVERY DAY 07/17/19   Rutherford Guys, MD  OXYGEN Inhale 1.5-2 L into the lungs as needed (for shortness of breath).    [provider]  pantoprazole (PROTONIX) 40 MG tablet Take 1 tablet (40 mg total) by mouth daily. 11/02/19   Rutherford Guys, MD  polyethylene glycol [employer] / Floria Raveling) packet Take 17 g  by mouth every other day.     [provider]  rosuvastatin (CRESTOR) 5 MG tablet TAKE 1 TABLET BY MOUTH 2 TIMES A WEEK. SUNDAY AND WEDNESDAY 08/15/19   Lorretta Harp, MD  spironolactone (ALDACTONE) 25 MG tablet TAKE 1 TABLET BY MOUTH EVERY DAY 03/13/19   Bhagat, Bhavinkumar, PA  tiZANidine (ZANAFLEX) 2 MG tablet Take 2 mg by mouth 3 (three) times daily. TAKE 1/2 TABLET IN THE AM, TAKE A 1/2 TABLET IN THE AFTERNOON TAKE A WHOLE TABLET AT BEDTIME    [provider]  traZODone (DESYREL) 100 MG tablet Take one tab daily as needed for sleep 08/21/19   Arfeen, Arlyce Harman, MD  warfarin (COUMADIN) 5 MG tablet TAKE 1/2 TO 1 TABLET  BY MOUTH EVERY DAY OR AS DIRECTED BY COUMADIN CLINIC 10/20/19   Martinique, Peter M, MD    Past Medical History:  Diagnosis Date  . CHF (congestive heart failure) ([place]) 03/02/2017   EF 25-30% 2018  . Complication of anesthesia    various issues with oxygen  saturations post op  . COPD (chronic obstructive pulmonary disease) ([place])   . Depression   . Diverticulitis   . DVT (deep venous thrombosis) ([place])   . Fibromyalgia   . GERD (gastroesophageal reflux disease)   . Hiatal hernia   . History of deviated nasal septum    left- side  . HTN (hypertension)   . Hyperlipidemia   . Obesity   . On supplemental oxygen therapy    concentrator at night @ 1.5 l/m or when sleeps. O2 Sat niormally 87.  . Osteoporosis   . PAT (paroxysmal atrial tachycardia) ([place])   . PFO (patent foramen ovale)   . PULMONARY NODULE, LEFT LOWER LOBE 10/14/2009   19mm LLL nodule dec 2010. Stable and 5mm in Oct 2012. No further fu  . PVD (peripheral vascular disease) with claudication ([place]) 12/2017  . Right middle lobe pneumonia 07/24/2011   First noted at admit 07/10/11. Persists on cxr 07/22/11. Cleared on CT 08/24/11. No further followup  . Stroke ([place])   . TOBACCO ABUSE 06/04/2009    Past Surgical History:  Procedure Laterality Date  . CARDIOVASCULAR STRESS TEST  12/26/2004   EF 74%.  NO EVIDENCE OF ISCHEMIA  . ESOPHAGOGASTRODUODENOSCOPY (EGD) WITH PROPOFOL N/A 04/15/2015   Procedure: ESOPHAGOGASTRODUODENOSCOPY (EGD) WITH PROPOFOL;  Surgeon: Laurence Spates, MD;  Location: [facility];  Service: Endoscopy;  Laterality: N/A;  . KNEE ARTHROSCOPY  2000   left  . LAPAROSCOPIC CHOLECYSTECTOMY  04-16-2010   [facility]  . LOWER EXTREMITY ANGIOGRAPHY N/A 09/09/2017   Procedure: Lower Extremity Angiography;  Surgeon: Lorretta Harp, MD;  Location: [facility];  Service: Cardiovascular;  Laterality: N/A;  . LOWER EXTREMITY INTERVENTION Left 01/17/2018   Procedure: LOWER EXTREMITY INTERVENTION;  Surgeon: Lorretta Harp, MD;  Location: [facility];  Service: Cardiovascular;  Laterality: Left;  . MOUTH SURGERY     03-26-15 multiple extractions stitches remains  . PERIPHERAL VASCULAR INTERVENTION Left 01/17/2018   Procedure: PERIPHERAL VASCULAR INTERVENTION;  Surgeon: Lorretta Harp, MD;  Location: [facility];  Service: Cardiovascular;  Laterality: Left;  COMMON ILIAC  . RIGHT/LEFT HEART CATH AND CORONARY ANGIOGRAPHY N/A 03/04/2017   Procedure: Right/Left Heart Cath and Coronary Angiography;  Surgeon: Peter M Martinique, MD;  Location: [facility];  Service: Cardiovascular;  Laterality: N/A;  . TOTAL ABDOMINAL HYSTERECTOMY     post op needed oxygen was told "she gave them a scare"  . TUBAL LIGATION    . US ECHOCARDIOGRAPHY  11/20/2009   EF 55-60%    Social History   Tobacco Use  . Smoking status: Former Smoker    Packs/day: 3.00    Years: 30.00    Pack years: 90.00    Types: Cigarettes    Quit date: 06/02/2010    Years since quitting: 9.4  . Smokeless tobacco: Never Used  . Tobacco comment: QUIT IN 2011  Substance Use Topics  . Alcohol use: No    Alcohol/week: 0.0 standard drinks    Family History  Problem Relation Age of Onset  . Dementia Mother   . Diabetes Mother   . Alzheimer's disease Mother   . Heart attack Brother 51  .  Heart attack  Father   . Schizophrenia Sister   . Diabetes Sister   . Tremor Sister     Review of Systems  Constitutional: Negative for chills and fever.  Respiratory: Negative for cough, shortness of breath and wheezing.   Cardiovascular: Negative for chest pain, palpitations and leg swelling.  Gastrointestinal: Positive for abdominal pain and diarrhea. Negative for nausea and vomiting.    Objective  Vitals as reported by the patient: per above  Gen: AAOx3, NAD Speaking in full sentences, breathing comfortably  BP Readings from Last 3 Encounters:  07/31/19 126/83  06/09/19 134/64  05/22/19 130/80    ASSESSMENT and PLAN  1. Diarrhea, unspecified type Resolved.   2. Essential hypertension Controlled. Continue current regime.  - Comprehensive metabolic panel; Future  3. Pure hypercholesterolemia Checking labs today, medications will be adjusted as needed. LDL goal < 70 - Lipid panel; Future - TSH; Future  4. Hyperglycemia, drug-induced A1c stable, controlled. Cont with LFM - Comprehensive metabolic panel; Future  5. Vitamin D deficiency Checking labs today, medications will be started as needed.  - VITAMIN D 25 Hydroxy (Vit-D Deficiency, Fractures); Future  Other orders - pantoprazole (PROTONIX) 40 MG tablet; Take 1 tablet (40 mg total) by mouth daily.  FOLLOW-UP: will come in for fasting labs next week, otherwise to schedule AWV with breast exam at her convinience   The above assessment and management plan was discussed with the patient. The patient verbalized understanding of and has agreed to the management plan. Patient is aware to call the clinic if symptoms persist or worsen. Patient is aware when to return to the clinic for a follow-up visit. Patient educated on when it is appropriate to go to the emergency department.    I provided 15  minutes of non-face-to-face time during this encounter.  Rutherford Guys, MD [facility] [place],  [place] [address] Ph.  [phone] Fax [phone]

## 2019-11-07 NOTE — Progress Notes (Signed)
Pt had to be turned into telemed due to abdominal pain and pt developed diarrhea while waiting to be seen. She came in for 6 month follow up. Pt was seen 6 mos ago for htn hyperglycemia, and candidal intertrigo

## 2019-11-08 ENCOUNTER — Encounter: Payer: Self-pay | Admitting: Orthopaedic Surgery

## 2019-11-08 ENCOUNTER — Ambulatory Visit (INDEPENDENT_AMBULATORY_CARE_PROVIDER_SITE_OTHER): Payer: Medicare Other | Admitting: Orthopaedic Surgery

## 2019-11-08 VITALS — Ht 64.0 in | Wt 187.0 lb

## 2019-11-08 DIAGNOSIS — G8929 Other chronic pain: Secondary | ICD-10-CM | POA: Diagnosis not present

## 2019-11-08 DIAGNOSIS — M25511 Pain in right shoulder: Secondary | ICD-10-CM

## 2019-11-08 DIAGNOSIS — M25512 Pain in left shoulder: Secondary | ICD-10-CM

## 2019-11-08 NOTE — Progress Notes (Signed)
Office Visit Note   Patient: Laurie Horton           Date of Birth: [birth year]           MRN: [id number] Visit Date: 11/08/2019              Requested by: [name], MD [address] [place],  [place] [address] PCP: [name], MD   Assessment & Plan: Visit Diagnoses:  1. Chronic left shoulder pain   2. Chronic right shoulder pain     Plan: History of left shoulder adhesive capsulitis that has improved considerably with physical therapy and presently she is quite happy.  Recently developed some "noises" in her right shoulder that appears to be either a small rotator cuff tear or subacromial bursitis.  Is not that symptomatic and at this point will just use Aspercreme and continue with her exercises.  We could always consider cortisone injection should it become more symptomatic.  We will plan to see her back as necessary.  She will continue with her home exercises  Follow-Up Instructions: Return if symptoms worsen or fail to improve.   Orders:  No orders of the defined types were placed in this encounter.  No orders of the defined types were placed in this encounter.     Procedures: No procedures performed   Clinical Data: No additional findings.   Subjective: Chief Complaint  Patient presents with  . Left Shoulder - Pain  Patient presents today for her left shoulder pain. She was last evaluated in August and received a cortisone injection. She said the injection was helpful. She just finished physical therapy. She said that she has noticed improvement and is still doing her home exercises. She said that her right shoulder now causes her problems. She said that you can feel it grinding when she moves it. She takes hydrocodone for her back pain. She does not want x-rays of her right shoulder today. Has prior history of right shoulder problem treated by Dr. Priscille Kluver 10 to 12 years ago.  She not have surgery but thinks she may have had a rotator cuff tear has been  doing well until just recently when she noted some grinding in her shoulder.  Not having that much pain or limitations of activity.  HPI  Review of Systems   Objective: Vital Signs: Ht 5\' 4"  (1.626 m)   Wt 187 lb (84.8 kg)   BMI 32.10 kg/m   Physical Exam Constitutional:      Appearance: She is well-developed.  Eyes:     Pupils: Pupils are equal, round, and reactive to light.  Pulmonary:     Effort: Pulmonary effort is normal.  Skin:    General: Skin is warm and dry.  Neurological:     Mental Status: She is alert and oriented to person, place, and time.  Psychiatric:        Behavior: Behavior normal.     Ortho Exam awake alert and oriented x3.  Comfortable sitting has a little bit of anterior subacromial group grating with internal and external rotation without much pain.  I suspect this may be subacromial bursitis but certainly could have a small rotator cuff tear.  Biceps intact.  Good quick overhead motion.  Empty can test is negative.  Good grip and release.  Left shoulder still has some residual loss of overhead motion but much better than it was in the past.  She can almost scratch the middle of her back.  Negative impingement  Specialty Comments:  No specialty comments available.  Imaging: No results found.   PMFS History: Patient Active Problem List   Diagnosis Date Noted  . Pain in right shoulder 11/08/2019  . Multiple drug allergies 09/15/2019  . Pain in left shoulder 06/21/2019  . Subclavian artery stenosis, left (HCC) 05/18/2019  . Claudication in peripheral vascular disease (HCC) 08/06/2017  . Class 1 obesity due to excess calories with serious comorbidity and body mass index (BMI) of 31.0 to 31.9 in adult 05/01/2017  . Chronic systolic CHF (congestive heart failure) (HCC) 03/04/2017  . Acute on chronic respiratory failure with hypoxia (HCC) 02/08/2017  . COPD, severe (HCC) 02/08/2017  . Acute diastolic heart failure, NYHA class 1 (HCC) 02/08/2017  .  Fibromyalgia and chronic chest pain 02/08/2017  . Atrial fibrillation with RVR (HCC) 02/08/2017  . Acute heart failure (HCC) 02/08/2017  . Rapid atrial fibrillation (HCC) 02/08/2017  . Shortness of breath 02/08/2017  . Moderate episode of recurrent major depressive disorder (HCC) 01/04/2017  . Generalized anxiety disorder 01/04/2017  . Pure hypercholesterolemia 12/28/2016  . 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 Acute blood loss anemia 08/03/2016  . Carotid stenosis 04/02/2015  . Long term current use of anticoagulant therapy 09/26/2014  . PAF (paroxysmal atrial fibrillation) (HCC) 09/19/2014  . 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 Chronic respiratory failure with hypoxia (HCC) 06/27/2014  . 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 Idiopathic scoliosis and kyphoscoliosis 07/19/2013  . Hyperglycemia, drug-induced 10/12/2011  . Encounter for long-term (current) use of medications 08/21/2011  . Thrush 07/24/2011  . PULMONARY NODULE, LEFT LOWER LOBE 10/14/2009  . PATENT FORAMEN OVALE 10/14/2009  . Essential hypertension 06/04/2009  . G E R D 06/04/2009  . SNORING, HX OF 06/04/2009   Past Medical History:  Diagnosis Date  . CHF (congestive heart failure) (HCC) 03/02/2017   EF 25-30% 2018  . Complication of anesthesia    various issues with oxygen  saturations post op  . COPD (chronic obstructive pulmonary disease) (HCC)   . Depression   . Diverticulitis   . DVT (deep venous thrombosis) (HCC)   . Fibromyalgia   . GERD (gastroesophageal reflux disease)   . Hiatal hernia   . History of deviated nasal septum    left- side  . HTN (hypertension)   . Hyperlipidemia   . Obesity   . On supplemental oxygen therapy    concentrator at night @ 1.5 l/m or when sleeps. O2 Sat niormally 87.  . Osteoporosis   . PAT (paroxysmal atrial tachycardia) (HCC)   . PFO (patent foramen ovale)   . PULMONARY NODULE, LEFT LOWER LOBE  10/14/2009   5mm LLL nodule dec 2010. Stable and 4mm in Oct 2012. No further fu  . PVD (peripheral vascular disease) with claudication (HCC) 12/2017  . Right middle lobe pneumonia 07/24/2011   First noted at admit 07/10/11. Persists on cxr 07/22/11. Cleared on CT 08/24/11. No further followup  . Stroke (HCC)   . TOBACCO ABUSE 06/04/2009    Family History  Problem Relation Age of Onset  . Dementia Mother   . Diabetes Mother   . Alzheimer's disease Mother   . Heart attack Brother 15  . Heart attack Father   . Schizophrenia Sister   . Diabetes Sister   . Tremor Sister     Past Surgical History:  Procedure Laterality Date  . CARDIOVASCULAR STRESS TEST  12/26/2004   EF  74%. NO EVIDENCE OF ISCHEMIA  . ESOPHAGOGASTRODUODENOSCOPY (EGD) WITH PROPOFOL N/A 04/15/2015   Procedure: ESOPHAGOGASTRODUODENOSCOPY (EGD) WITH PROPOFOL;  Surgeon: Carman Ching, MD;  [facility];  Service: Endoscopy;  Laterality: N/A;  . KNEE ARTHROSCOPY  2000   left  . LAPAROSCOPIC CHOLECYSTECTOMY  04-16-2010   [facility]  . LOWER EXTREMITY ANGIOGRAPHY N/A 09/09/2017   Procedure: Lower Extremity Angiography;  Surgeon: Runell Gess, MD;  Location: [facility]AS[facility];  Service: Cardiovascular;  Laterality: N/A;  . LOWER EXTREMITY INTERVENTION Left 01/17/2018   Procedure: LOWER EXTREMITY INTERVENTION;  Surgeon: Runell Gess, MD;  Location: [facility] INVAS[facility];  Service: Cardiovascular;  Laterality: Left;  . MOUTH SURGERY     03-26-15 multiple extractions stitches remains  . PERIPHERAL VASCULAR INTERVENTION Left 01/17/2018   Procedure: PERIPHERAL VASCULAR INTERVENTION;  Surgeon: Runell Gess, MD;  [facility] INVAS[facility] LAB;  Service: Cardiovascular;  Laterality: Left;  COMMON ILIAC  . RIGHT/LEFT HEART CATH AND CORONARY ANGIOGRAPHY N/A 03/04/2017   Procedure: Right/Left Heart Cath and Coronary Angiography;  Surgeon: Azreal Stthomas M Swaziland, MD;  Location: [facility] INVASIVE [facility] LAB;  Service: Cardiovascular;  Laterality: N/A;    . TOTAL ABDOMINAL HYSTERECTOMY     post op needed oxygen was told "she gave them a scare"  . TUBAL LIGATION    . US ECHOCARDIOGRAPHY  11/20/2009   EF 55-60%   Social History   Occupational History  . Occupation: disability  Tobacco Use  . Smoking status: Former Smoker    Packs/day: 3.00    Years: 30.00    Pack years: 90.00    Types: Cigarettes    Quit date: 06/02/2010    Years since quitting: 9.4  . Smokeless tobacco: Never Used  . Tobacco comment: QUIT IN 2011  Substance and Sexual Activity  . Alcohol use: No    Alcohol/week: 0.0 standard drinks  . Drug use: No  . Sexual activity: Yes    Partners: Male    Birth control/protection: Post-menopausal, Surgical

## 2019-11-09 ENCOUNTER — Other Ambulatory Visit: Payer: Self-pay | Admitting: Family Medicine

## 2019-11-12 ENCOUNTER — Other Ambulatory Visit: Payer: Self-pay | Admitting: Family Medicine

## 2019-11-13 ENCOUNTER — Other Ambulatory Visit: Payer: Self-pay

## 2019-11-13 ENCOUNTER — Ambulatory Visit (INDEPENDENT_AMBULATORY_CARE_PROVIDER_SITE_OTHER): Payer: Medicare Other | Admitting: Family Medicine

## 2019-11-13 DIAGNOSIS — E559 Vitamin D deficiency, unspecified: Secondary | ICD-10-CM

## 2019-11-13 DIAGNOSIS — T50905A Adverse effect of unspecified drugs, medicaments and biological substances, initial encounter: Secondary | ICD-10-CM

## 2019-11-13 DIAGNOSIS — R739 Hyperglycemia, unspecified: Secondary | ICD-10-CM

## 2019-11-13 DIAGNOSIS — I10 Essential (primary) hypertension: Secondary | ICD-10-CM

## 2019-11-13 DIAGNOSIS — E7800 Pure hypercholesterolemia, unspecified: Secondary | ICD-10-CM

## 2019-11-14 ENCOUNTER — Encounter: Payer: Self-pay | Admitting: Family Medicine

## 2019-11-14 LAB — COMPREHENSIVE METABOLIC PANEL
ALT: 12 IU/L (ref 0–32)
AST: 13 IU/L (ref 0–40)
Albumin/Globulin Ratio: 2 (ref 1.2–2.2)
Albumin: 4.3 g/dL (ref 3.7–4.7)
Alkaline Phosphatase: 95 IU/L (ref 39–117)
BUN/Creatinine Ratio: 12 (ref 12–28)
BUN: 12 mg/dL (ref 8–27)
Bilirubin Total: 0.5 mg/dL (ref 0.0–1.2)
CO2: 26 mmol/L (ref 20–29)
Calcium: 9.9 mg/dL (ref 8.7–10.3)
Chloride: 96 mmol/L (ref 96–106)
Creatinine, Ser: 1 mg/dL (ref 0.57–1.00)
GFR calc Af Amer: 65 mL/min/{1.73_m2} (ref >59)
GFR calc non Af Amer: 56 mL/min/{1.73_m2} — ABNORMAL LOW (ref >59)
Globulin, Total: 2.2 g/dL (ref 1.5–4.5)
Glucose: 120 mg/dL — ABNORMAL HIGH (ref 65–99)
Potassium: 4.4 mmol/L (ref 3.5–5.2)
Sodium: 137 mmol/L (ref 134–144)
Total Protein: 6.5 g/dL (ref 6.0–8.5)

## 2019-11-14 LAB — LIPID PANEL
Chol/HDL Ratio: 4.3 ratio (ref 0.0–4.4)
Cholesterol, Total: 167 mg/dL (ref 100–199)
HDL: 39 mg/dL — ABNORMAL LOW (ref >39)
LDL Chol Calc (NIH): 99 mg/dL (ref 0–99)
Triglycerides: 164 mg/dL — ABNORMAL HIGH (ref 0–149)
VLDL Cholesterol Cal: 29 mg/dL (ref 5–40)

## 2019-11-14 LAB — TSH: TSH: 2.18 u[IU]/mL (ref 0.450–4.500)

## 2019-11-14 LAB — VITAMIN D 25 HYDROXY (VIT D DEFICIENCY, FRACTURES): Vit D, 25-Hydroxy: 12.8 ng/mL — ABNORMAL LOW (ref 30.0–100.0)

## 2019-11-17 MED ORDER — VITAMIN D (ERGOCALCIFEROL) 1.25 MG (50000 UNIT) PO CAPS: 50000.0000 [IU] | ORAL_CAPSULE | ORAL | 0 refills | Status: DC

## 2019-11-17 NOTE — Addendum Note (Signed)
Addended by: Rutherford Guys on: 11/17/2019 01:39 PM   Modules accepted: Orders

## 2019-11-18 ENCOUNTER — Encounter: Payer: Self-pay | Admitting: Family Medicine

## 2019-11-21 ENCOUNTER — Other Ambulatory Visit: Payer: Self-pay

## 2019-11-21 ENCOUNTER — Encounter (HOSPITAL_COMMUNITY): Payer: Self-pay | Admitting: Psychiatry

## 2019-11-21 ENCOUNTER — Ambulatory Visit (INDEPENDENT_AMBULATORY_CARE_PROVIDER_SITE_OTHER): Payer: Medicare Other | Admitting: Psychiatry

## 2019-11-21 DIAGNOSIS — F411 Generalized anxiety disorder: Secondary | ICD-10-CM | POA: Diagnosis not present

## 2019-11-21 DIAGNOSIS — F331 Major depressive disorder, recurrent, moderate: Secondary | ICD-10-CM | POA: Diagnosis not present

## 2019-11-21 MED ORDER — DESVENLAFAXINE SUCCINATE ER 50 MG PO TB24: 50.0000 mg | ORAL_TABLET | Freq: Every day | ORAL | 2 refills | Status: DC

## 2019-11-21 MED ORDER — DIAZEPAM 2 MG PO TABS: 2.0000 mg | ORAL_TABLET | Freq: Every day | ORAL | 0 refills | Status: DC | PRN

## 2019-11-21 MED ORDER — TRAZODONE HCL 100 MG PO TABS: ORAL_TABLET | ORAL | 2 refills | Status: DC

## 2019-11-21 NOTE — Progress Notes (Signed)
Virtual Visit via Telephone Note  I connected with Laurie Horton on 11/21/19 at  8:40 AM EST by telephone and verified that I am speaking with the correct person using two identifiers.   I discussed the limitations, risks, security and privacy concerns of performing an evaluation and management service by telephone and the availability of in person appointments. I also discussed with the patient that there may be a patient responsible charge related to this service. The patient expressed understanding and agreed to proceed.   History of Present Illness: Patient was evaluated by phone session.  She has a quite Christmas.  She admitted chronic anxiety and nervousness when she had about Covid and mononucleosis.  There are some nights when she do not sleep very well.  She has taken Valium few times and that helped her anxiety.  She is still struggling with insomnia but since she had physical therapy of her shoulder it is slowly and gradually improving.  She endorsed lack of energy, fatigue, motivation and she talked to her PCP will get vitamin D level and it came very low.  She is now taking vitamin D supplement.  She continues to get support and help from her two daughter.  However they do not come on a regular basis.  She feels the current medicine is working which includes trazodone and Pristiq and as needed Valium.  She was able to find the dog after realized that she need a companion.  Her previous dog died off her 63 years.  However she is not sure if new dog getting along with the cat but she want to give more time to the dog.  She denies any crying spells or any suicidal thoughts.  She admitted getting easily boredom and try to keep herself busy watching TV.  She denies any paranoia, hallucination or any nightmares.  She has no tremors or shakes.  She is taking gabapentin, muscle relaxant which is provided by her pain management.  Patient denies drinking or using any illegal substances.   Recent  Results (from the past 2160 hour(s))  POCT INR     Status: Abnormal   Collection Time: 09/11/19  8:18 AM  Result Value Ref Range   INR 3.1 (A) 2.0 - 3.0  POCT INR     Status: Abnormal   Collection Time: 10/11/19  8:01 AM  Result Value Ref Range   INR 3.9 (A) 2.0 - 3.0  POCT INR     Status: Abnormal   Collection Time: 10/20/19  8:23 AM  Result Value Ref Range   INR 3.2 (A) 2.0 - 3.0  POCT INR     Status: Abnormal   Collection Time: 11/07/19  8:32 AM  Result Value Ref Range   INR 1.5 (A) 2.0 - 3.0  VITAMIN D 25 Hydroxy (Vit-D Deficiency, Fractures)     Status: Abnormal   Collection Time: 11/13/19  8:28 AM  Result Value Ref Range   Vit D, 25-Hydroxy 12.8 (L) 30.0 - 100.0 ng/mL    Comment: Vitamin D deficiency has been defined by the [place] and an Endocrine Society practice guideline as a level of serum 25-OH vitamin D less than 20 ng/mL (1,2). The Endocrine Society went on to further define vitamin D insufficiency as a level between 21 and 29 ng/mL (2). 1. IOM (Institute of Medicine). 2010. Dietary reference    intakes for calcium and D. [place]: The    [employer]. 2. Holick MF, Binkley [place], Bischoff-Ferrari  HA, et al.    Evaluation, treatment, and prevention of vitamin D    deficiency: an Endocrine Society clinical practice    guideline. JCEM. 2011 Jul; 96(7):1911-30.   TSH     Status: None   Collection Time: 11/13/19  8:28 AM  Result Value Ref Range   TSH 2.180 0.450 - 4.500 uIU/mL  Lipid panel     Status: Abnormal   Collection Time: 11/13/19  8:28 AM  Result Value Ref Range   Cholesterol, Total 167 100 - 199 mg/dL   Triglycerides 164 (H) 0 - 149 mg/dL   HDL 39 (L) >39 mg/dL   VLDL Cholesterol Cal 29 5 - 40 mg/dL   LDL Chol Calc (NIH) 99 0 - 99 mg/dL   Chol/HDL Ratio 4.3 0.0 - 4.4 ratio    Comment:                                   T. Chol/HDL Ratio                                             Men  Women                                1/2 Avg.Risk  3.4    3.3                                   Avg.Risk  5.0    4.4                                2X Avg.Risk  9.6    7.1                                3X Avg.Risk 23.4   11.0   Comprehensive metabolic panel     Status: Abnormal   Collection Time: 11/13/19  8:28 AM  Result Value Ref Range   Glucose 120 (H) 65 - 99 mg/dL   BUN 12 8 - 27 mg/dL   Creatinine, Ser 1.00 0.57 - 1.00 mg/dL   GFR calc non Af Amer 56 (L) >59 mL/min/1.73   GFR calc Af Amer 65 >59 mL/min/1.73   BUN/Creatinine Ratio 12 12 - 28   Sodium 137 134 - 144 mmol/L   Potassium 4.4 3.5 - 5.2 mmol/L   Chloride 96 96 - 106 mmol/L   CO2 26 20 - 29 mmol/L   Calcium 9.9 8.7 - 10.3 mg/dL   Total Protein 6.5 6.0 - 8.5 g/dL   Albumin 4.3 3.7 - 4.7 g/dL   Globulin, Total 2.2 1.5 - 4.5 g/dL   Albumin/Globulin Ratio 2.0 1.2 - 2.2   Bilirubin Total 0.5 0.0 - 1.2 mg/dL   Alkaline Phosphatase 95 39 - 117 IU/L   AST 13 0 - 40 IU/L   ALT 12 0 - 32 IU/L      Psychiatric Specialty Exam: Physical Exam  Review of Systems  There were no vitals taken for this visit.There is no height or weight on file to calculate BMI.  General Appearance: NA  Eye Contact:  NA  Speech:  Normal Rate  Volume:  Normal  Mood:  Anxious  Affect:  NA  Thought Process:  Descriptions of Associations: Intact  Orientation:  Full (Time, Place, and Person)  Thought Content:  Rumination  Suicidal Thoughts:  No  Homicidal Thoughts:  No  Memory:  Immediate;   Good Recent;   Good Remote;   Good  Judgement:  Intact  Insight:  Present  Psychomotor Activity:  NA  Concentration:  Concentration: Fair and Attention Span: Fair  Recall:  Good  Fund of Knowledge:  Good  Language:  Good  Akathisia:  No  Handed:  Right  AIMS (if indicated):     Assets:  Communication Skills Desire for Improvement Housing Resilience Social Support  ADL's:  Intact  Cognition:  WNL  Sleep:   ok     Assessment and Plan: Major depressive disorder, recurrent.   Generalized anxiety disorder.  Discuss walking and find things that keep her happy including listening music or reading books.  Recommend not to watch use these days as she gets more nervous when she had about Covid and political news.  I reviewed blood work.  I also recommend that she should give some time to vitamin D to see the difference in her energy.  She does not want to change medication since she admitted it helps most of the time.  I will continue trazodone 100 mg at bedtime, Pristiq 50 mg daily and Valium 2 mg as needed for severe anxiety.  Discussed benzodiazepine dependence tolerance and withdrawal.  Offered therapy but patient refused.  I recommend to call us back if she is any question of any concern.  Follow-up in 3 months.  Follow Up Instructions:    I discussed the assessment and treatment plan with the patient. The patient was provided an opportunity to ask questions and all were answered. The patient agreed with the plan and demonstrated an understanding of the instructions.   The patient was advised to call back or seek an in-person evaluation if the symptoms worsen or if the condition fails to improve as anticipated.  I provided 20 minutes of non-face-to-face time during this encounter.   Kathlee Nations, MD

## 2019-11-22 ENCOUNTER — Observation Stay (HOSPITAL_COMMUNITY)
Admission: EM | Admit: 2019-11-22 | Discharge: 2019-11-23 | Discharge status: Against Medical Advice | Payer: Medicare Other | Attending: Internal Medicine | Admitting: Internal Medicine

## 2019-11-22 ENCOUNTER — Ambulatory Visit: Payer: Medicare Other | Admitting: Family Medicine

## 2019-11-22 ENCOUNTER — Encounter (HOSPITAL_COMMUNITY): Payer: Self-pay

## 2019-11-22 ENCOUNTER — Emergency Department (HOSPITAL_COMMUNITY): Payer: Medicare Other

## 2019-11-22 ENCOUNTER — Other Ambulatory Visit: Payer: Self-pay

## 2019-11-22 ENCOUNTER — Ambulatory Visit (INDEPENDENT_AMBULATORY_CARE_PROVIDER_SITE_OTHER): Payer: Medicare Other | Admitting: Ophthalmology

## 2019-11-22 ENCOUNTER — Encounter (INDEPENDENT_AMBULATORY_CARE_PROVIDER_SITE_OTHER): Payer: Self-pay | Admitting: Ophthalmology

## 2019-11-22 DIAGNOSIS — Z20822 Contact with and (suspected) exposure to covid-19: Secondary | ICD-10-CM | POA: Diagnosis not present

## 2019-11-22 DIAGNOSIS — F331 Major depressive disorder, recurrent, moderate: Secondary | ICD-10-CM | POA: Insufficient documentation

## 2019-11-22 DIAGNOSIS — Z87891 Personal history of nicotine dependence: Secondary | ICD-10-CM | POA: Diagnosis not present

## 2019-11-22 DIAGNOSIS — Z9981 Dependence on supplemental oxygen: Secondary | ICD-10-CM | POA: Diagnosis not present

## 2019-11-22 DIAGNOSIS — I10 Essential (primary) hypertension: Secondary | ICD-10-CM

## 2019-11-22 DIAGNOSIS — Z881 Allergy status to other antibiotic agents status: Secondary | ICD-10-CM | POA: Insufficient documentation

## 2019-11-22 DIAGNOSIS — Z7901 Long term (current) use of anticoagulants: Secondary | ICD-10-CM | POA: Insufficient documentation

## 2019-11-22 DIAGNOSIS — J9611 Chronic respiratory failure with hypoxia: Secondary | ICD-10-CM | POA: Insufficient documentation

## 2019-11-22 DIAGNOSIS — E785 Hyperlipidemia, unspecified: Secondary | ICD-10-CM | POA: Insufficient documentation

## 2019-11-22 DIAGNOSIS — H3411 Central retinal artery occlusion, right eye: Secondary | ICD-10-CM | POA: Diagnosis not present

## 2019-11-22 DIAGNOSIS — I6782 Cerebral ischemia: Secondary | ICD-10-CM | POA: Insufficient documentation

## 2019-11-22 DIAGNOSIS — Z8673 Personal history of transient ischemic attack (TIA), and cerebral infarction without residual deficits: Secondary | ICD-10-CM | POA: Insufficient documentation

## 2019-11-22 DIAGNOSIS — I5022 Chronic systolic (congestive) heart failure: Secondary | ICD-10-CM | POA: Insufficient documentation

## 2019-11-22 DIAGNOSIS — Z79899 Other long term (current) drug therapy: Secondary | ICD-10-CM | POA: Diagnosis not present

## 2019-11-22 DIAGNOSIS — Z961 Presence of intraocular lens: Secondary | ICD-10-CM

## 2019-11-22 DIAGNOSIS — I480 Paroxysmal atrial fibrillation: Secondary | ICD-10-CM | POA: Insufficient documentation

## 2019-11-22 DIAGNOSIS — H35033 Hypertensive retinopathy, bilateral: Secondary | ICD-10-CM

## 2019-11-22 DIAGNOSIS — E7800 Pure hypercholesterolemia, unspecified: Secondary | ICD-10-CM | POA: Insufficient documentation

## 2019-11-22 DIAGNOSIS — Z888 Allergy status to other drugs, medicaments and biological substances status: Secondary | ICD-10-CM | POA: Insufficient documentation

## 2019-11-22 DIAGNOSIS — H3581 Retinal edema: Secondary | ICD-10-CM

## 2019-11-22 DIAGNOSIS — Z79891 Long term (current) use of opiate analgesic: Secondary | ICD-10-CM | POA: Diagnosis not present

## 2019-11-22 DIAGNOSIS — E669 Obesity, unspecified: Secondary | ICD-10-CM | POA: Diagnosis not present

## 2019-11-22 DIAGNOSIS — Z91041 Radiographic dye allergy status: Secondary | ICD-10-CM | POA: Diagnosis not present

## 2019-11-22 DIAGNOSIS — Z886 Allergy status to analgesic agent status: Secondary | ICD-10-CM | POA: Diagnosis not present

## 2019-11-22 DIAGNOSIS — H5461 Unqualified visual loss, right eye, normal vision left eye: Secondary | ICD-10-CM | POA: Diagnosis present

## 2019-11-22 DIAGNOSIS — J449 Chronic obstructive pulmonary disease, unspecified: Secondary | ICD-10-CM | POA: Insufficient documentation

## 2019-11-22 DIAGNOSIS — Z883 Allergy status to other anti-infective agents status: Secondary | ICD-10-CM | POA: Insufficient documentation

## 2019-11-22 DIAGNOSIS — I110 Hypertensive heart disease with heart failure: Secondary | ICD-10-CM | POA: Diagnosis not present

## 2019-11-22 DIAGNOSIS — I739 Peripheral vascular disease, unspecified: Secondary | ICD-10-CM | POA: Diagnosis not present

## 2019-11-22 DIAGNOSIS — Z86718 Personal history of other venous thrombosis and embolism: Secondary | ICD-10-CM | POA: Insufficient documentation

## 2019-11-22 DIAGNOSIS — M797 Fibromyalgia: Secondary | ICD-10-CM | POA: Diagnosis not present

## 2019-11-22 DIAGNOSIS — F411 Generalized anxiety disorder: Secondary | ICD-10-CM | POA: Diagnosis not present

## 2019-11-22 DIAGNOSIS — H25811 Combined forms of age-related cataract, right eye: Secondary | ICD-10-CM

## 2019-11-22 DIAGNOSIS — Z6831 Body mass index (BMI) 31.0-31.9, adult: Secondary | ICD-10-CM | POA: Insufficient documentation

## 2019-11-22 DIAGNOSIS — K219 Gastro-esophageal reflux disease without esophagitis: Secondary | ICD-10-CM | POA: Insufficient documentation

## 2019-11-22 LAB — COMPREHENSIVE METABOLIC PANEL
ALT: 15 U/L (ref 0–44)
AST: 19 U/L (ref 15–41)
Albumin: 4 g/dL (ref 3.5–5.0)
Alkaline Phosphatase: 76 U/L (ref 38–126)
Anion gap: 12 (ref 5–15)
BUN: 12 mg/dL (ref 8–23)
CO2: 27 mmol/L (ref 22–32)
Calcium: 9.7 mg/dL (ref 8.9–10.3)
Chloride: 98 mmol/L (ref 98–111)
Creatinine, Ser: 1.16 mg/dL — ABNORMAL HIGH (ref 0.44–1.00)
GFR calc Af Amer: 54 mL/min — ABNORMAL LOW (ref >60)
GFR calc non Af Amer: 47 mL/min — ABNORMAL LOW (ref >60)
Glucose, Bld: 149 mg/dL — ABNORMAL HIGH (ref 70–99)
Potassium: 3.7 mmol/L (ref 3.5–5.1)
Sodium: 137 mmol/L (ref 135–145)
Total Bilirubin: 0.7 mg/dL (ref 0.3–1.2)
Total Protein: 6.6 g/dL (ref 6.5–8.1)

## 2019-11-22 LAB — PROTIME-INR
INR: 1.9 — ABNORMAL HIGH (ref 0.8–1.2)
Prothrombin Time: 22.1 seconds — ABNORMAL HIGH (ref 11.4–15.2)

## 2019-11-22 LAB — DIFFERENTIAL
Abs Immature Granulocytes: 0.01 10*3/uL (ref 0.00–0.07)
Basophils Absolute: 0 10*3/uL (ref 0.0–0.1)
Basophils Relative: 0 %
Eosinophils Absolute: 0.1 10*3/uL (ref 0.0–0.5)
Eosinophils Relative: 2 %
Immature Granulocytes: 0 %
Lymphocytes Relative: 22 %
Lymphs Abs: 1.4 10*3/uL (ref 0.7–4.0)
Monocytes Absolute: 0.7 10*3/uL (ref 0.1–1.0)
Monocytes Relative: 10 %
Neutro Abs: 4.3 10*3/uL (ref 1.7–7.7)
Neutrophils Relative %: 66 %

## 2019-11-22 LAB — CBC
HCT: 40.9 % (ref 36.0–46.0)
Hemoglobin: 12.9 g/dL (ref 12.0–15.0)
MCH: 29.3 pg (ref 26.0–34.0)
MCHC: 31.5 g/dL (ref 30.0–36.0)
MCV: 92.7 fL (ref 80.0–100.0)
Platelets: 240 10*3/uL (ref 150–400)
RBC: 4.41 MIL/uL (ref 3.87–5.11)
RDW: 13.8 % (ref 11.5–15.5)
WBC: 6.5 10*3/uL (ref 4.0–10.5)
nRBC: 0 % (ref 0.0–0.2)

## 2019-11-22 LAB — APTT: aPTT: 34 seconds (ref 24–36)

## 2019-11-22 MED ORDER — BRIMONIDINE TARTRATE 0.2 % OP SOLN: 1.0000 [drp] | Freq: Two times a day (BID) | OPHTHALMIC | 6 refills | Status: DC

## 2019-11-22 MED ORDER — SODIUM CHLORIDE 0.9% FLUSH
3.0000 mL | Freq: Once | INTRAVENOUS | Status: DC
Start: 2019-11-22 — End: 2019-11-23

## 2019-11-22 MED ORDER — DORZOLAMIDE HCL-TIMOLOL MAL 2-0.5 % OP SOLN: 1.0000 [drp] | Freq: Two times a day (BID) | OPHTHALMIC | 6 refills | Status: DC

## 2019-11-22 NOTE — ED Notes (Signed)
The pt is very upset   She does not feel like the doctor took time to talk to her.  She has been here over 6 hours she wants to go home to feed her animals.  She has lost   The vision in her rt eye

## 2019-11-22 NOTE — ED Provider Notes (Signed)
[facility] EMERGENCY DEPARTMENT Provider Note   CSN: [id number] Arrival date & time: 11/22/19  1747     History Chief Complaint  Patient presents with  . Loss of Vision    Laurie Horton is a 74 y.o. female.  The history is provided by the patient.  Eye Problem Location:  Right eye Quality: no pain. Duration: awoke with painless loss of vision. Timing:  Constant Progression:  Unchanged Chronicity:  New Context: not using machinery   Relieved by:  Nothing Worsened by:  Nothing Ineffective treatments:  None tried Associated symptoms: decreased vision   Associated symptoms: no double vision, no facial rash, no numbness, no vomiting and no weakness   Risk factors: no conjunctival hemorrhage   Patient with PAF on coumadin who awoke with painless loss of vision this am.  Was seen by her optometrist and sent to a retinal specialist, Dr. Coralyn Pear.  Sent in for further work up.  No weakness, no numbness., no changes in speech.  No f/c/r.       Past Medical History:  Diagnosis Date  . CHF (congestive heart failure) ([place]) 03/02/2017   EF 25-30% 2018  . Complication of anesthesia    various issues with oxygen  saturations post op  . COPD (chronic obstructive pulmonary disease) ([place])   . Depression   . Diverticulitis   . DVT (deep venous thrombosis) ([place])   . Fibromyalgia   . GERD (gastroesophageal reflux disease)   . Hiatal hernia   . History of deviated nasal septum    left- side  . HTN (hypertension)   . Hyperlipidemia   . Obesity   . On supplemental oxygen therapy    concentrator at night @ 1.5 l/m or when sleeps. O2 Sat niormally 87.  . Osteoporosis   . PAT (paroxysmal atrial tachycardia) ([place])   . PFO (patent foramen ovale)   . PULMONARY NODULE, LEFT LOWER LOBE 10/14/2009   76mm LLL nodule dec 2010. Stable and 83mm in Oct 2012. No further fu  . PVD (peripheral vascular disease) with claudication ([place]) 12/2017  . Right middle lobe pneumonia  07/24/2011   First noted at admit 07/10/11. Persists on cxr 07/22/11. Cleared on CT 08/24/11. No further followup  . Stroke ([place])   . TOBACCO ABUSE 06/04/2009    Patient Active Problem List   Diagnosis Date Noted  . Pain in right shoulder 11/08/2019  . Multiple drug allergies 09/15/2019  . Pain in left shoulder 06/21/2019  . Subclavian artery stenosis, left ([place]) 05/18/2019  . Claudication in peripheral vascular disease ([place]) 08/06/2017  . Class 1 obesity due to excess calories with serious comorbidity and body mass index (BMI) of 31.0 to 31.9 in adult 05/01/2017  . Chronic systolic CHF (congestive heart failure) ([place]) 03/04/2017  . Acute on chronic respiratory failure with hypoxia ([place]) 02/08/2017  . COPD, severe ([place]) 02/08/2017  . Acute diastolic heart failure, NYHA class 1 ([place]) 02/08/2017  . Fibromyalgia and chronic chest pain 02/08/2017  . Atrial fibrillation with RVR ([place]) 02/08/2017  . Acute heart failure ([place]) 02/08/2017  . Rapid atrial fibrillation ([place]) 02/08/2017  . Shortness of breath 02/08/2017  . Moderate episode of recurrent major depressive disorder ([place]) 01/04/2017  . Generalized anxiety disorder 01/04/2017  . Pure hypercholesterolemia 12/28/2016  . 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 Acute blood loss anemia 08/03/2016  . Carotid stenosis 04/02/2015  . Long term current use of anticoagulant therapy 09/26/2014  . PAF (paroxysmal atrial  fibrillation) ([place]) 09/19/2014  . 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 Chronic respiratory failure with hypoxia ([place]) 06/27/2014  . 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 Idiopathic scoliosis and kyphoscoliosis 07/19/2013  . Hyperglycemia, drug-induced 10/12/2011  . Encounter for long-term (current) use of medications 08/21/2011  . Thrush 07/24/2011  . PULMONARY NODULE, LEFT LOWER LOBE 10/14/2009  . PATENT FORAMEN OVALE 10/14/2009  . Essential hypertension  06/04/2009  . G E R D 06/04/2009  . SNORING, HX OF 06/04/2009    Past Surgical History:  Procedure Laterality Date  . CARDIOVASCULAR STRESS TEST  12/26/2004   EF 74%. NO EVIDENCE OF ISCHEMIA  . ESOPHAGOGASTRODUODENOSCOPY (EGD) WITH PROPOFOL N/A 04/15/2015   Procedure: ESOPHAGOGASTRODUODENOSCOPY (EGD) WITH PROPOFOL;  Surgeon: Laurence Spates, MD;  Location: [facility];  Service: Endoscopy;  Laterality: N/A;  . KNEE ARTHROSCOPY  2000   left  . LAPAROSCOPIC CHOLECYSTECTOMY  04-16-2010   [place]  . LOWER EXTREMITY ANGIOGRAPHY N/A 09/09/2017   Procedure: Lower Extremity Angiography;  Surgeon: Lorretta Harp, MD;  Location: [facility] CV LAB;  Service: Cardiovascular;  Laterality: N/A;  . LOWER EXTREMITY INTERVENTION Left 01/17/2018   Procedure: LOWER EXTREMITY INTERVENTION;  Surgeon: Lorretta Harp, MD;  Location: [facility];  Service: Cardiovascular;  Laterality: Left;  . MOUTH SURGERY     03-26-15 multiple extractions stitches remains  . PERIPHERAL VASCULAR INTERVENTION Left 01/17/2018   Procedure: PERIPHERAL VASCULAR INTERVENTION;  Surgeon: Lorretta Harp, MD;  Location: [facility];  Service: Cardiovascular;  Laterality: Left;  COMMON ILIAC  . RIGHT/LEFT HEART CATH AND CORONARY ANGIOGRAPHY N/A 03/04/2017   Procedure: Right/Left Heart Cath and Coronary Angiography;  Surgeon: Peter M Martinique, MD;  Location: [facility];  Service: Cardiovascular;  Laterality: N/A;  . TOTAL ABDOMINAL HYSTERECTOMY     post op needed oxygen was told "she gave them a scare"  . TUBAL LIGATION    . US ECHOCARDIOGRAPHY  11/20/2009   EF 55-60%     OB History   No obstetric history on file.     Family History  Problem Relation Age of Onset  . Dementia Mother   . Diabetes Mother   . Alzheimer's disease Mother   . Heart attack Brother 71  . Heart attack Father   . Schizophrenia Sister   . Diabetes Sister   . Tremor Sister     Social History   Tobacco Use  . Smoking status:  Former Smoker    Packs/day: 3.00    Years: 30.00    Pack years: 90.00    Types: Cigarettes    Quit date: 06/02/2010    Years since quitting: 9.4  . Smokeless tobacco: Never Used  . Tobacco comment: QUIT IN 2011  Substance Use Topics  . Alcohol use: No    Alcohol/week: 0.0 standard drinks  . Drug use: No    Home Medications Prior to Admission medications   Medication Sig Start Date End Date Taking? Authorizing Provider  brimonidine (ALPHAGAN) 0.2 % ophthalmic solution Place 1 drop into the right eye 2 (two) times daily. 11/22/19 11/21/20  Bernarda Caffey, MD  carvedilol (COREG) 12.5 MG tablet Take 1 tablet (12.5 mg total) by mouth 2 (two) times daily. 06/15/19 06/09/20  Lorretta Harp, MD  desvenlafaxine (PRISTIQ) 50 MG 24 hr tablet Take 1 tablet (50 mg total) by mouth daily. 11/21/19   Arfeen, Arlyce Harman, MD  diazepam (VALIUM) 2 MG tablet Take 1 tablet (2  mg total) by mouth daily as needed for anxiety. 11/21/19   Arfeen, Arlyce Harman, MD  diclofenac sodium (VOLTAREN) 1 % GEL APPLY 2 GRAMS TO BOTH HANDS 4 TIMES A DAY 06/21/18   [provider]  dorzolamide-timolol (COSOPT) 22.3-6.8 MG/ML ophthalmic solution Place 1 drop into the right eye 2 (two) times daily. 11/22/19 11/21/20  Bernarda Caffey, MD  Eyelid Cleansers (OCUSOFT BABY EYELID & EYELASH EX) Apply topically.    [provider]  furosemide (LASIX) 40 MG tablet Take 1 tablet (40 mg total) by mouth daily. 10/02/19   Martinique, Peter M, MD  gabapentin (NEURONTIN) 100 MG capsule Take 100-200 mg by mouth See admin instructions. 100 mg in am, 200 mg at noon and  200 mg at bedtime 09/16/16   [provider]  HYDROcodone-acetaminophen (NORCO/VICODIN) 5-325 MG tablet Take 1 tablet by mouth 3 (three) times daily.     [provider]  ipratropium (ATROVENT) 0.02 % nebulizer solution USE 1 VIAL BY NEBULIZATION 4 (FOUR) TIMES DAILY. 10/17/19   Brand Males, MD  montelukast (SINGULAIR) 10 MG tablet TAKE 1 TABLET BY MOUTH EVERYDAY  AT BEDTIME 11/09/19   Rutherford Guys, MD  ondansetron (ZOFRAN-ODT) 8 MG disintegrating tablet TAKE 1 TABLET BY MOUTH EVERY 8 HOURS AS NEEDED NAUSEA OR VOMITING 02/09/19   Rutherford Guys, MD  oxybutynin (DITROPAN-XL) 10 MG 24 hr tablet TAKE 1 TABLET BY MOUTH EVERY DAY 11/12/19   Rutherford Guys, MD  OXYGEN Inhale 1.5-2 L into the lungs as needed (for shortness of breath).    [provider]  pantoprazole (PROTONIX) 40 MG tablet Take 1 tablet (40 mg total) by mouth daily. 11/07/19   Rutherford Guys, MD  polyethylene glycol [facility] / Floria Raveling) packet Take 17 g by mouth every other day.     [provider]  rosuvastatin (CRESTOR) 5 MG tablet TAKE 1 TABLET BY MOUTH 2 TIMES A WEEK. SUNDAY AND WEDNESDAY 08/15/19   Lorretta Harp, MD  spironolactone (ALDACTONE) 25 MG tablet TAKE 1 TABLET BY MOUTH EVERY DAY 03/13/19   Bhagat, Bhavinkumar, PA  tiZANidine (ZANAFLEX) 2 MG tablet Take 2 mg by mouth 3 (three) times daily. TAKE 1/2 TABLET IN THE AM, TAKE A 1/2 TABLET IN THE AFTERNOON TAKE A WHOLE TABLET AT BEDTIME    [provider]  traZODone (DESYREL) 100 MG tablet Take one tab daily as needed for sleep 11/21/19   Arfeen, Arlyce Harman, MD  Vitamin D, Ergocalciferol, (DRISDOL) 1.25 MG (50000 UNIT) CAPS capsule Take 1 capsule (50,000 Units total) by mouth every 7 (seven) days. 11/17/19   Rutherford Guys, MD  warfarin (COUMADIN) 5 MG tablet TAKE 1/2 TO 1 TABLET BY MOUTH EVERY DAY OR AS DIRECTED BY COUMADIN CLINIC 10/20/19   Martinique, Peter M, MD    Allergies    Alprazolam, Bee venom, Iodine, Pseudoephedrine hcl er, Budesonide-formoterol fumarate, Crestor [rosuvastatin calcium], Esomeprazole magnesium, Flonase [fluticasone propionate], Lamictal [lamotrigine], Loratadine, Lotrimin [clotrimazole], Lunesta [eszopiclone], Oxcarbazepine, Statins, Zolpidem tartrate, Betadine [povidone iodine], Bevespi aerosphere [glycopyrrolate-formoterol], Clarithromycin, Effexor [venlafaxine], Lexapro [escitalopram  oxalate], Aciphex [rabeprazole sodium], Alendronate sodium, Avelox [moxifloxacin hcl in nacl], Bextra [valdecoxib], Ceclor [cefaclor], Cephalexin, Covera-hs [verapamil hcl], Dicyclomine hcl, Other, and Tessalon perles  Review of Systems   Review of Systems  Constitutional: Negative for fever.  HENT: Negative for congestion.   Eyes: Positive for visual disturbance. Negative for double vision.  Respiratory: Negative for shortness of breath.   Cardiovascular: Negative for chest pain.  Gastrointestinal: Negative for abdominal pain  and vomiting.  Genitourinary: Negative for difficulty urinating.  Musculoskeletal: Negative for arthralgias.  Neurological: Negative for facial asymmetry, speech difficulty, weakness and numbness.  Psychiatric/Behavioral: Negative for agitation.  All other systems reviewed and are negative.   Physical Exam Updated Vital Signs BP (!) 154/72 (BP Location: Right Arm)   Pulse (!) 50   Temp 98.5 F (36.9 C) (Oral)   Resp 18   SpO2 95%   Physical Exam Vitals and nursing note reviewed.  Constitutional:      Appearance: Normal appearance.  HENT:     Head: Normocephalic and atraumatic.     Nose: Nose normal.  Eyes:     Conjunctiva/sclera: Conjunctivae normal.     Pupils: Pupils are equal, round, and reactive to light.  Cardiovascular:     Rate and Rhythm: Normal rate and regular rhythm.     Pulses: Normal pulses.     Heart sounds: Normal heart sounds.  Pulmonary:     Effort: Pulmonary effort is normal.     Breath sounds: Normal breath sounds.  Abdominal:     General: Abdomen is flat. Bowel sounds are normal.     Tenderness: There is no abdominal tenderness. There is no guarding or rebound.  Musculoskeletal:        General: Normal range of motion.     Cervical back: Normal range of motion and neck supple.  Skin:    General: Skin is warm and dry.     Capillary Refill: Capillary refill takes less than 2 seconds.  Neurological:     General: No focal  deficit present.     Mental Status: She is alert and oriented to person, place, and time.     Motor: No weakness.     Deep Tendon Reflexes: Reflexes normal.  Psychiatric:        Mood and Affect: Mood normal.        Behavior: Behavior normal.     ED Results / Procedures / Treatments   Labs (all labs ordered are listed, but only abnormal results are displayed) Results for orders placed or performed during the hospital encounter of 11/22/19  Protime-INR  Result Value Ref Range   Prothrombin Time 22.1 (H) 11.4 - 15.2 seconds   INR 1.9 (H) 0.8 - 1.2  APTT  Result Value Ref Range   aPTT 34 24 - 36 seconds  CBC  Result Value Ref Range   WBC 6.5 4.0 - 10.5 K/uL   RBC 4.41 3.87 - 5.11 MIL/uL   Hemoglobin 12.9 12.0 - 15.0 g/dL   HCT 40.9 36.0 - 46.0 %   MCV 92.7 80.0 - 100.0 fL   MCH 29.3 26.0 - 34.0 pg   MCHC 31.5 30.0 - 36.0 g/dL   RDW 13.8 11.5 - 15.5 %   Platelets 240 150 - 400 K/uL   nRBC 0.0 0.0 - 0.2 %  Differential  Result Value Ref Range   Neutrophils Relative % 66 %   Neutro Abs 4.3 1.7 - 7.7 K/uL   Lymphocytes Relative 22 %   Lymphs Abs 1.4 0.7 - 4.0 K/uL   Monocytes Relative 10 %   Monocytes Absolute 0.7 0.1 - 1.0 K/uL   Eosinophils Relative 2 %   Eosinophils Absolute 0.1 0.0 - 0.5 K/uL   Basophils Relative 0 %   Basophils Absolute 0.0 0.0 - 0.1 K/uL   Immature Granulocytes 0 %   Abs Immature Granulocytes 0.01 0.00 - 0.07 K/uL  Comprehensive metabolic panel  Result Value Ref Range  Sodium 137 135 - 145 mmol/L   Potassium 3.7 3.5 - 5.1 mmol/L   Chloride 98 98 - 111 mmol/L   CO2 27 22 - 32 mmol/L   Glucose, Bld 149 (H) 70 - 99 mg/dL   BUN 12 8 - 23 mg/dL   Creatinine, Ser 1.16 (H) 0.44 - 1.00 mg/dL   Calcium 9.7 8.9 - 10.3 mg/dL   Total Protein 6.6 6.5 - 8.1 g/dL   Albumin 4.0 3.5 - 5.0 g/dL   AST 19 15 - 41 U/L   ALT 15 0 - 44 U/L   Alkaline Phosphatase 76 38 - 126 U/L   Total Bilirubin 0.7 0.3 - 1.2 mg/dL   GFR calc non Af Amer 47 (L) >60 mL/min    GFR calc Af Amer 54 (L) >60 mL/min   Anion gap 12 5 - 15   CT HEAD WO CONTRAST  Result Date: 11/22/2019 CLINICAL DATA:  Focal neuro deficit, greater than 6 hours, stroke suspected. Additional history provided: Patient reports loss of vision in right eye since waking up at 7:45 this morning, some dizziness EXAM: CT HEAD WITHOUT CONTRAST TECHNIQUE: Contiguous axial images were obtained from the base of the skull through the vertex without intravenous contrast. COMPARISON:  Brain MRI 08/31/2015 FINDINGS: Brain: There is no evidence of acute intracranial hemorrhage. No acute demarcated cortical infarction is identified. Redemonstrated chronic left occipital lobe cortical/subcortical infarct. Unchanged chronic lacunar infarcts within bilateral cerebellar hemispheres. Background of advanced confluent hypoattenuation within the cerebral white matter which is nonspecific, but consistent with chronic small vessel ischemic disease. Unchanged prominent perivascular spaces within the bilateral basal ganglia. No evidence of intracranial mass. No midline shift or extra-axial fluid collection. Mild generalized parenchymal atrophy. Vascular: No hyperdense vessel. Atherosclerotic calcifications. Small symmetric anteromedial globe calcifications. Skull: Normal. Negative for fracture or focal lesion. Sinuses/Orbits: Visualized orbits demonstrate no acute abnormality. No significant paranasal sinus disease or mastoid effusion at the imaged levels. IMPRESSION: No CT evidence of acute intracranial abnormality. Chronic left occipital lobe cortical/subcortical infarct. Advanced chronic small vessel ischemic disease. Redemonstrated chronic lacunar infarcts within bilateral cerebellar hemispheres. Mild generalized parenchymal atrophy. Electronically Signed   By: Kellie Simmering DO   On: 11/22/2019 21:16   OCT, Retina - OU - Both Eyes  Result Date: 11/22/2019 Right Eye Quality was good. Central Foveal Thickness: 305. Progression has no  prior data. Findings include normal foveal contour, no IRF, no SRF, intraretinal hyper-reflective material (Diffuse inner retinal hyper reflectivity with small area of sparing nasal macula / peripappilary region). Left Eye Quality was good. Central Foveal Thickness: 367. Progression has no prior data. Findings include normal foveal contour, no IRF, no SRF. 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 CME - cystoid macular edema. PED - pigment epithelial detachment. IRF - intraretinal fluid. SRF - subretinal fluid. EZ - ellipsoid zone. ERM - epiretinal membrane. ORA - outer retinal atrophy. ORT - outer retinal tubulation. SRHM - subretinal hyper-reflective material   Fluorescein Angiography Optos (Transit OD)  Result Date: 11/22/2019 Right Eye Progression has no prior data. Early phase findings include delayed filling, vascular perfusion defect (Low signal - severely delayed filling time). Mid/Late phase findings include vascular perfusion defect (Low signal). Left Eye Progression has no prior data. Early phase findings include normal observations. Mid/Late phase findings include normal observations. Notes **Images stored  on drive** Impression: OD: low fluorescein signal; delayed filling time and vascular perfusion defect consistent with CRAO OS: normal study   EKG EKG Interpretation  Date/Time:  Wednesday November 22 2019 17:53:38 EST Ventricular Rate:  115 PR Interval:    QRS Duration: 100 QT Interval:  394 QTC Calculation: 545 R Axis:   84 Text Interpretation: Sinus tachycardia with Premature supraventricular complexes ST & T wave abnormality, consider anterolateral ischemia Prolonged QT Confirmed by Dory Horn) on 11/22/2019 11:06:57 PM   Radiology CT HEAD WO  CONTRAST  Result Date: 11/22/2019 CLINICAL DATA:  Focal neuro deficit, greater than 6 hours, stroke suspected. Additional history provided: Patient reports loss of vision in right eye since waking up at 7:45 this morning, some dizziness EXAM: CT HEAD WITHOUT CONTRAST TECHNIQUE: Contiguous axial images were obtained from the base of the skull through the vertex without intravenous contrast. COMPARISON:  Brain MRI 08/31/2015 FINDINGS: Brain: There is no evidence of acute intracranial hemorrhage. No acute demarcated cortical infarction is identified. Redemonstrated chronic left occipital lobe cortical/subcortical infarct. Unchanged chronic lacunar infarcts within bilateral cerebellar hemispheres. Background of advanced confluent hypoattenuation within the cerebral white matter which is nonspecific, but consistent with chronic small vessel ischemic disease. Unchanged prominent perivascular spaces within the bilateral basal ganglia. No evidence of intracranial mass. No midline shift or extra-axial fluid collection. Mild generalized parenchymal atrophy. Vascular: No hyperdense vessel. Atherosclerotic calcifications. Small symmetric anteromedial globe calcifications. Skull: Normal. Negative for fracture or focal lesion. Sinuses/Orbits: Visualized orbits demonstrate no acute abnormality. No significant paranasal sinus disease or mastoid effusion at the imaged levels. IMPRESSION: No CT evidence of acute intracranial abnormality. Chronic left occipital lobe cortical/subcortical infarct. Advanced chronic small vessel ischemic disease. Redemonstrated chronic lacunar infarcts within bilateral cerebellar hemispheres. Mild generalized parenchymal atrophy. Electronically Signed   By: Kellie Simmering DO   On: 11/22/2019 21:16   OCT, Retina - OU - Both Eyes  Result Date: 11/22/2019 Right Eye Quality was good. Central Foveal Thickness: 305. Progression has no prior data. Findings include normal foveal contour, no IRF, no SRF,  intraretinal hyper-reflective material (Diffuse inner retinal hyper reflectivity with small area of sparing nasal macula / peripappilary region). Left Eye Quality was good. Central Foveal Thickness: 367. Progression has no prior data. Findings include normal foveal contour, no IRF, no SRF. 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 CME - cystoid macular edema. PED - pigment epithelial detachment. IRF - intraretinal fluid. SRF - subretinal fluid. EZ - ellipsoid zone. ERM - epiretinal membrane. ORA - outer retinal atrophy. ORT - outer retinal tubulation. SRHM - subretinal hyper-reflective material   Fluorescein Angiography Optos (Transit OD)  Result Date: 11/22/2019 Right Eye Progression has no prior data. Early phase findings include delayed filling, vascular perfusion defect (Low signal - severely delayed filling time). Mid/Late phase findings include vascular perfusion defect (Low signal). Left Eye Progression has no prior data. Early phase findings include normal observations. Mid/Late phase findings include normal observations. Notes **Images stored on drive** Impression: OD: low fluorescein signal; delayed filling time and vascular perfusion defect consistent with CRAO OS: normal study   Procedures Procedures (including critical care time)  Medications Ordered in ED Medications  sodium chloride flush (NS) 0.9 % injection 3 mL (has no administration in time range)    ED Course  I have reviewed the  triage vital signs and the nursing notes.  Pertinent labs & imaging results that were available during my care of the patient were reviewed by me and considered in my medical decision making (see chart for details).     1125 Case the neuro hospitalist.  Case d/w Dr. Lorraine Lax.  Please  order CTA.  He will see the patient in the ED.    Patient allergic to contrast will need to change to MRA head and neck  Patient was admitted to Dr. Hal Hope  Final Clinical Impression(s) / ED Diagnoses   Patient is AO4.  She states she has pets at home that need to be cared for and cannot stay at this time. Patient was advised of the signing out AMA.  These are but are not limited to: death, coma, progression of stroke symptoms, prolonged morbidity from failure to treat. Patient has decision making capacity to refuse care.  I have spoken with her at length about this.  She has decided to sign out AMA. She is welcome to return at any time.       Haylo Fake, MD 11/23/19 0120

## 2019-11-22 NOTE — Progress Notes (Signed)
ANTICOAGULATION CONSULT NOTE - Initial Consult  Pharmacy Consult for coumadin Indication: atrial fibrillation  Allergies  Allergen Reactions  . Alprazolam Anaphylaxis and Other (See Comments)    REACTION: stops breathing  . Bee Venom Anaphylaxis  . Iodine Anaphylaxis, Swelling and Other (See Comments)    REACTION: swelling in throat  . Pseudoephedrine Hcl Er Shortness Of Breath  . Budesonide-Formoterol Fumarate Other (See Comments)    Blisters inside of mouth all over  . Crestor [Rosuvastatin Calcium] Other (See Comments)    Unable to walk  . Esomeprazole Magnesium Other (See Comments)    REACTION: "bouncing off walls"  . Flonase [Fluticasone Propionate] Other (See Comments)    NOSE BLEED  . Lamictal [Lamotrigine] Rash    Patient got rash, labored breathing, and diarrhea  . Loratadine Other (See Comments)    claritin D causes shaking  . Lotrimin [Clotrimazole] Other (See Comments)    Mouth blisters  . Lunesta [Eszopiclone] Other (See Comments)    REACTION: "slept for a week"  . Oxcarbazepine Other (See Comments)    Causes deep sleep and dizziness  . Statins Other (See Comments)    Can't walk, legs won't work   . Zolpidem Tartrate Other (See Comments)    REACTION: "slept for a week"  . Betadine [Povidone Iodine] Other (See Comments)    Breathing problems  . Bevespi Aerosphere [Glycopyrrolate-Formoterol] Other (See Comments)    Pt believes this caused mouth sores and thrush   . Clarithromycin Other (See Comments)    All "mycins", Puts into "a" fib, Will take if has to for severe sinus infection  . Effexor [Venlafaxine] Nausea And Vomiting and Other (See Comments)    cramps  . Lexapro [Escitalopram Oxalate] Other (See Comments)    hallucinations  . Aciphex [Rabeprazole Sodium] Rash  . Alendronate Sodium Other (See Comments)    "caused stomach problems for 3 days"  . Avelox [Moxifloxacin Hcl In Nacl] Other (See Comments)    Stomach cramps.   Darlin Coco [Valdecoxib] Rash   . Ceclor [Cefaclor] Rash  . Cephalexin Rash and Other (See Comments)    Pt states that she is possibly allergic to this - had a reaction to Cefaclor in the past and she does not want to these class drugs. Added per patient request.  . Covera-Hs [Verapamil Hcl] Palpitations  . Dicyclomine Hcl Rash  . Other Other (See Comments)    Glue from ekg/heart monitor leads --rash, Any MYCINS  . Tessalon Perles Rash    Patient Measurements:    Vital Signs: Temp: 98.5 F (36.9 C) (01/20 1759) Temp Source: Oral (01/20 1759) BP: 154/72 (01/20 2148) Pulse Rate: 50 (01/20 2148)  Labs: Recent Labs    11/22/19 1807  HGB 12.9  HCT 40.9  PLT 240  APTT 34  LABPROT 22.1*  INR 1.9*  CREATININE 1.16*    CrCl cannot be calculated (Unknown ideal weight.).   Medical History: Past Medical History:  Diagnosis Date  . CHF (congestive heart failure) ([place]) 03/02/2017   EF 25-30% 2018  . Complication of anesthesia    various issues with oxygen  saturations post op  . COPD (chronic obstructive pulmonary disease) ([place])   . Depression   . Diverticulitis   . DVT (deep venous thrombosis) ([place])   . Fibromyalgia   . GERD (gastroesophageal reflux disease)   . Hiatal hernia   . History of deviated nasal septum    left- side  . HTN (hypertension)   . Hyperlipidemia   .  Obesity   . On supplemental oxygen therapy    concentrator at night @ 1.5 l/m or when sleeps. O2 Sat niormally 87.  . Osteoporosis   . PAT (paroxysmal atrial tachycardia) ([place])   . PFO (patent foramen ovale)   . PULMONARY NODULE, LEFT LOWER LOBE 10/14/2009   81mm LLL nodule dec 2010. Stable and 51mm in Oct 2012. No further fu  . PVD (peripheral vascular disease) with claudication ([place]) 12/2017  . Right middle lobe pneumonia 07/24/2011   First noted at admit 07/10/11. Persists on cxr 07/22/11. Cleared on CT 08/24/11. No further followup  . Stroke ([place])   . TOBACCO ABUSE 06/04/2009    Medications:  See medication  history  Assessment: 74 yo lady to continue coumadin for afib.  INR 1.9.  She was taken her dose for today Goal of Therapy:  INR 2-3 Monitor platelets by anticoagulation protocol: Yes   Plan:  Daily PT/INR Monitor for bleeding complications  Luan Maberry Poteet 11/22/2019,11:52 PM

## 2019-11-22 NOTE — ED Triage Notes (Signed)
Pt reports loss of vision in right eye since waking up at 745 this morning. Denies headache, some dizziness with ambulation. Pt called her doctor and he sent her here to rule out a stroke in her eye. No neuro deficits noted in triage, pt a.o.

## 2019-11-22 NOTE — Progress Notes (Addendum)
[facility] Note  11/22/2019     CHIEF COMPLAINT Patient presents for Retina Evaluation   HISTORY OF PRESENT ILLNESS: Laurie Horton is a 74 y.o. female who presents to the clinic today for:   HPI    Retina Evaluation    In right eye.  This started 1 day ago.  Duration of 1.  Associated Symptoms Flashes.  Context:  distance vision.  I, the attending physician,  performed the HPI with the patient and updated documentation appropriately.          Comments    Pt states she woke up this morning and states she lost vision in her right eye--says she can only see a pinhole of light--like looking through a straw.  Patient denies any vision changes or distortion OS.  Patient denies eye pain or discomfort.  This morning, patient had light sparkles OD--she denies any history of floaters.  Hx of CE/IOL OS (Dr. Katy Fitch)       Last edited by Bernarda Caffey, MD on 11/22/2019  2:06 PM. (History)    pt was sent here urgently by Dr. Parke Simmers for possible artery occlusion OD, pt states she has congestive heart failure, she states she was hospitalized once and had 19lbs of fluid removed off her heart, pt states she has a blood clot in her left arm, her bp this morning was 134/74, pt is on oxygen, and has early stage emphysema, she is on warfarin, but has not taken her dose this morning, pt has vitamin D deficiency, pt states Dr. Parke Simmers doesn't think there is any hope for her to re-gain vision in her right eye, he told her he thinks she has had a stroke in her eye as well, pt denies numbness or tingling  Referring physician: [place], [place], [place],   [id number]  HISTORICAL INFORMATION:   Selected notes from the [facility] Referred by Dr. [place] DeMarco for concern of artery occlusion OD LEE: 01.20.21 (J. DeMarco) Ocular Hx- PMH-   CURRENT MEDICATIONS: Current Outpatient Medications (Ophthalmic Drugs)  Medication Sig  .  brimonidine (ALPHAGAN) 0.2 % ophthalmic solution Place 1 drop into the right eye 2 (two) times daily.  . dorzolamide-timolol (COSOPT) 22.3-6.8 MG/ML ophthalmic solution Place 1 drop into the right eye 2 (two) times daily.   No current facility-administered medications for this visit. (Ophthalmic Drugs)   Current Outpatient Medications (Other)  Medication Sig  . carvedilol (COREG) 12.5 MG tablet Take 1 tablet (12.5 mg total) by mouth 2 (two) times daily.  Marland Kitchen desvenlafaxine (PRISTIQ) 50 MG 24 hr tablet Take 1 tablet (50 mg total) by mouth daily.  . diazepam (VALIUM) 2 MG tablet Take 1 tablet (2 mg total) by mouth daily as needed for anxiety.  . diclofenac sodium (VOLTAREN) 1 % GEL APPLY 2 GRAMS TO BOTH HANDS 4 TIMES A DAY  . Eyelid Cleansers (OCUSOFT BABY EYELID & EYELASH EX) Apply topically.  . furosemide (LASIX) 40 MG tablet Take 1 tablet (40 mg total) by mouth daily.  Marland Kitchen gabapentin (NEURONTIN) 100 MG capsule Take 100-200 mg by mouth See admin instructions. 100 mg in am, 200 mg at noon and  200 mg at bedtime  . HYDROcodone-acetaminophen (NORCO/VICODIN) 5-325 MG tablet Take 1 tablet by mouth 3 (three) times daily.   Marland Kitchen ipratropium (ATROVENT) 0.02 % nebulizer solution USE 1 VIAL BY NEBULIZATION 4 (FOUR) TIMES DAILY.  . montelukast (SINGULAIR) 10 MG tablet TAKE 1 TABLET BY MOUTH EVERYDAY  AT BEDTIME  . ondansetron (ZOFRAN-ODT) 8 MG disintegrating tablet TAKE 1 TABLET BY MOUTH EVERY 8 HOURS AS NEEDED NAUSEA OR VOMITING  . oxybutynin (DITROPAN-XL) 10 MG 24 hr tablet TAKE 1 TABLET BY MOUTH EVERY DAY  . OXYGEN Inhale 1.5-2 L into the lungs as needed (for shortness of breath).  . pantoprazole (PROTONIX) 40 MG tablet Take 1 tablet (40 mg total) by mouth daily.  . polyethylene glycol (MIRALAX / GLYCOLAX) packet Take 17 g by mouth every other day.   . rosuvastatin (CRESTOR) 5 MG tablet TAKE 1 TABLET BY MOUTH 2 TIMES A WEEK. SUNDAY AND WEDNESDAY  . spironolactone (ALDACTONE) 25 MG tablet TAKE 1 TABLET BY  MOUTH EVERY DAY  . tiZANidine (ZANAFLEX) 2 MG tablet Take 2 mg by mouth 3 (three) times daily. TAKE 1/2 TABLET IN THE AM, TAKE A 1/2 TABLET IN THE AFTERNOON TAKE A WHOLE TABLET AT BEDTIME  . traZODone (DESYREL) 100 MG tablet Take one tab daily as needed for sleep  . Vitamin D, Ergocalciferol, (DRISDOL) 1.25 MG (50000 UNIT) CAPS capsule Take 1 capsule (50,000 Units total) by mouth every 7 (seven) days.  Marland Kitchen warfarin (COUMADIN) 5 MG tablet TAKE 1/2 TO 1 TABLET BY MOUTH EVERY DAY OR AS DIRECTED BY COUMADIN CLINIC   No current facility-administered medications for this visit. (Other)      REVIEW OF SYSTEMS: ROS    Positive for: Eyes   Negative for: Constitutional, Gastrointestinal, Neurological, Skin, Genitourinary, Musculoskeletal, HENT, Endocrine, Cardiovascular, Respiratory, Psychiatric, Allergic/Imm, Heme/Lymph   Last edited by Doneen Poisson on 11/22/2019  1:25 PM. (History)       ALLERGIES Allergies  Allergen Reactions  . Alprazolam Anaphylaxis and Other (See Comments)    REACTION: stops breathing  . Bee Venom Anaphylaxis  . Iodine Anaphylaxis, Swelling and Other (See Comments)    REACTION: swelling in throat  . Pseudoephedrine Hcl Er Shortness Of Breath  . Budesonide-Formoterol Fumarate Other (See Comments)    Blisters inside of mouth all over  . Crestor [Rosuvastatin Calcium] Other (See Comments)    Unable to walk  . Esomeprazole Magnesium Other (See Comments)    REACTION: "bouncing off walls"  . Flonase [Fluticasone Propionate] Other (See Comments)    NOSE BLEED  . Lamictal [Lamotrigine] Rash    Patient got rash, labored breathing, and diarrhea  . Loratadine Other (See Comments)    claritin D causes shaking  . Lotrimin [Clotrimazole] Other (See Comments)    Mouth blisters  . Lunesta [Eszopiclone] Other (See Comments)    REACTION: "slept for a week"  . Oxcarbazepine Other (See Comments)    Causes deep sleep and dizziness  . Statins Other (See Comments)    Can't  walk, legs won't work   . Zolpidem Tartrate Other (See Comments)    REACTION: "slept for a week"  . Betadine [Povidone Iodine] Other (See Comments)    Breathing problems  . Bevespi Aerosphere [Glycopyrrolate-Formoterol] Other (See Comments)    Pt believes this caused mouth sores and thrush   . Clarithromycin Other (See Comments)    All "mycins", Puts into "a" fib, Will take if has to for severe sinus infection  . Effexor [Venlafaxine] Nausea And Vomiting and Other (See Comments)    cramps  . Lexapro [Escitalopram Oxalate] Other (See Comments)    hallucinations  . Aciphex [Rabeprazole Sodium] Rash  . Alendronate Sodium Other (See Comments)    "caused stomach problems for 3 days"  . Avelox [Moxifloxacin Hcl In Nacl] Other (See Comments)  Stomach cramps.   Darlin Coco [Valdecoxib] Rash  . Ceclor [Cefaclor] Rash  . Cephalexin Rash and Other (See Comments)    Pt states that she is possibly allergic to this - had a reaction to Cefaclor in the past and she does not want to these class drugs. Added per patient request.  . Covera-Hs [Verapamil Hcl] Palpitations  . Dicyclomine Hcl Rash  . Other Other (See Comments)    Glue from ekg/heart monitor leads --rash, Any MYCINS  . Tessalon Perles Rash    PAST MEDICAL HISTORY Past Medical History:  Diagnosis Date  . CHF (congestive heart failure) ([place]) 03/02/2017   EF 25-30% 2018  . Complication of anesthesia    various issues with oxygen  saturations post op  . COPD (chronic obstructive pulmonary disease) ([place])   . Depression   . Diverticulitis   . DVT (deep venous thrombosis) ([place])   . Fibromyalgia   . GERD (gastroesophageal reflux disease)   . Hiatal hernia   . History of deviated nasal septum    left- side  . HTN (hypertension)   . Hyperlipidemia   . Obesity   . On supplemental oxygen therapy    concentrator at night @ 1.5 l/m or when sleeps. O2 Sat niormally 87.  . Osteoporosis   . PAT (paroxysmal atrial tachycardia) ([place])   .  PFO (patent foramen ovale)   . PULMONARY NODULE, LEFT LOWER LOBE 10/14/2009   75mm LLL nodule dec 2010. Stable and 48mm in Oct 2012. No further fu  . PVD (peripheral vascular disease) with claudication ([place]) 12/2017  . Right middle lobe pneumonia 07/24/2011   First noted at admit 07/10/11. Persists on cxr 07/22/11. Cleared on CT 08/24/11. No further followup  . Stroke ([place])   . TOBACCO ABUSE 06/04/2009   Past Surgical History:  Procedure Laterality Date  . CARDIOVASCULAR STRESS TEST  12/26/2004   EF 74%. NO EVIDENCE OF ISCHEMIA  . ESOPHAGOGASTRODUODENOSCOPY (EGD) WITH PROPOFOL N/A 04/15/2015   Procedure: ESOPHAGOGASTRODUODENOSCOPY (EGD) WITH PROPOFOL;  Surgeon: Laurence Spates, MD;  Location: [facility];  Service: Endoscopy;  Laterality: N/A;  . KNEE ARTHROSCOPY  2000   left  . LAPAROSCOPIC CHOLECYSTECTOMY  04-16-2010   [place]  . LOWER EXTREMITY ANGIOGRAPHY N/A 09/09/2017   Procedure: Lower Extremity Angiography;  Surgeon: Lorretta Harp, MD;  Location: [facility];  Service: Cardiovascular;  Laterality: N/A;  . LOWER EXTREMITY INTERVENTION Left 01/17/2018   Procedure: LOWER EXTREMITY INTERVENTION;  Surgeon: Lorretta Harp, MD;  Location: [facility];  Service: Cardiovascular;  Laterality: Left;  . MOUTH SURGERY     03-26-15 multiple extractions stitches remains  . PERIPHERAL VASCULAR INTERVENTION Left 01/17/2018   Procedure: PERIPHERAL VASCULAR INTERVENTION;  Surgeon: Lorretta Harp, MD;  Location: [facility];  Service: Cardiovascular;  Laterality: Left;  COMMON ILIAC  . RIGHT/LEFT HEART CATH AND CORONARY ANGIOGRAPHY N/A 03/04/2017   Procedure: Right/Left Heart Cath and Coronary Angiography;  Surgeon: Peter M [place], MD;  Location: [facility];  Service: Cardiovascular;  Laterality: N/A;  . TOTAL ABDOMINAL HYSTERECTOMY     post op needed oxygen was told "she gave them a scare"  . TUBAL LIGATION    . US ECHOCARDIOGRAPHY  11/20/2009   EF 55-60%    FAMILY  HISTORY Family History  Problem Relation Age of Onset  . Dementia Mother   . Diabetes Mother   . Alzheimer's disease Mother   . Heart attack Brother 29  . Heart attack  Father   . Schizophrenia Sister   . Diabetes Sister   . Tremor Sister     SOCIAL HISTORY Social History   Tobacco Use  . Smoking status: Former Smoker    Packs/day: 3.00    Years: 30.00    Pack years: 90.00    Types: Cigarettes    Quit date: 06/02/2010    Years since quitting: 9.4  . Smokeless tobacco: Never Used  . Tobacco comment: QUIT IN 2011  Substance Use Topics  . Alcohol use: No    Alcohol/week: 0.0 standard drinks  . Drug use: No         OPHTHALMIC EXAM:  Base Eye Exam    Visual Acuity (Snellen - Linear)      Right Left   Dist [place] HM 20/60 -2   Dist ph [place] NI 20/40 +1       Tonometry (Tonopen, 1:33 PM)      Right Left   Pressure 20 19       Tonometry #2 (Tonopen, 3:33 PM)      Right Left   Pressure 10 13       Pupils      Dark Light Shape React APD   Right 8 8 Round Dilated 0   Left 8 8 Round Dilated 0       Visual Fields      Left Right    Full    Restrictions  Total superior temporal, inferior temporal, superior nasal, inferior nasal deficiencies       Extraocular Movement      Right Left    Full Full       Neuro/Psych    Oriented x3: Yes   Mood/Affect: Normal       Dilation    Both eyes: 1.0% Mydriacyl, 2.5% Phenylephrine @ 1:33 PM  Pt was dilated upon arrival from Dr. Stefanie Libel office.        Slit Lamp and Fundus Exam    Slit Lamp Exam      Right Left   Lids/Lashes Dermatochalasis - upper lid, Meibomian gland dysfunction Dermatochalasis - upper lid, Meibomian gland dysfunction   Conjunctiva/Sclera Nasal Pinguecula; otherwise white and quiet Nasal Pinguecula; otherwise white and quiet   Cornea Arcus Arcus, 2+ Punctate epithelial erosions, well healed temporal cataract wounds   Anterior Chamber deep, narrow temporal angle deep, narrow temporal angle   Iris  Round and dilated Round and dilated   Lens 3+ Nuclear sclerosis with brunescence, 2-3+ Cortical cataract PC IOL in good position, 1-2+ Posterior capsular opacification   Vitreous Vitreous syneresis Vitreous syneresis       Fundus Exam      Right Left   Disc Pink and Sharp Pink and Sharp   C/D Ratio 0.1 0.1   Macula Flat, Good foveal reflex; mild retinal whitening with peripapillary sparing and early central cherry red spot Flat, Blunted foveal reflex, mild Retinal pigment epithelial mottling, No heme or edema   Vessels Severe Vascular attenuation, but no box-carring, AV crossing changes Vascular attenuation, Tortuous, mild AV crossing changes   Periphery Attached, mild reticular degeneration Attached           Refraction    Wearing Rx   Wears glasses but did not bring today--states they do not help       Manifest Refraction      Sphere Cylinder Axis Dist VA   Right NI +/-3.00      Left -1.00 +0.75 135 20/40-1  IMAGING AND PROCEDURES  Imaging and Procedures for @TODAY @  OCT, Retina - OU - Both Eyes       Right Eye Quality was good. Central Foveal Thickness: 305. Progression has no prior data. Findings include normal foveal contour, no IRF, no SRF, intraretinal hyper-reflective material (Diffuse inner retinal hyper reflectivity with small area of sparing nasal macula / peripappilary region).   Left Eye Quality was good. Central Foveal Thickness: 367. Progression has no prior data. Findings include normal foveal contour, no IRF, no SRF.   Notes *Images captured and stored on drive  Diagnosis / Impression:  OD: diffuse inner retinal hyperreflectivity consistent with CRAO; small area of peripapillary sparing OS: NFP, no IRF/SRF  Clinical management:  See below  Abbreviations: NFP - Normal foveal profile. CME - cystoid macular edema. PED - pigment epithelial detachment. IRF - intraretinal fluid. SRF - subretinal fluid. EZ - ellipsoid zone. ERM - epiretinal  membrane. ORA - outer retinal atrophy. ORT - outer retinal tubulation. SRHM - subretinal hyper-reflective material        Fluorescein Angiography Optos (Transit OD)       Right Eye   Progression has no prior data. Early phase findings include delayed filling, vascular perfusion defect (Low signal - severely delayed filling time). Mid/Late phase findings include vascular perfusion defect (Low signal).   Left Eye   Progression has no prior data. Early phase findings include normal observations. Mid/Late phase findings include normal observations.   Notes **Images stored on drive**  Impression: OD: low fluorescein signal; delayed filling time and vascular perfusion defect consistent with CRAO OS: normal study                  ASSESSMENT/PLAN:    ICD-10-CM   1. Central retinal artery occlusion of right eye  H34.11   2. Retinal edema  H35.81 OCT, Retina - OU - Both Eyes  3. Hypertensive retinopathy of both eyes  H35.033 Fluorescein Angiography Optos (Transit OD)  4. Essential hypertension  I10   5. Pseudophakia  Z96.1   6. Combined forms of age-related cataract of right eye  H25.811     1,2. Central retinal artery occlusion (CRAO) OD - pt reports acute painless vision loss OD upon waking this morning (1.20.2021) - initially presented to Dr. 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, and pt is on Warfarin - on exam, BCVA HM with early retinal whitening and central cherry red spot - IOP 20, given 1 drop of brimonidine and Cosopt OD -- IOP lowered to 10 mmHg - discussed findings, guarded prognosis, treatment options - recommend pt present to ED for urgent MRI/MRA and stroke work up -- possible repeat carotid ultrasound and cardiac echo; possible CT Angio head/neck - recommend keeping intraocular pressure low in right eye to promote perfusion of the retina -  pt is well established with cardiology and vascular surgery, so will notify teams of this new diagnosis for consideration of further optimization of CV risk factors. - f/u here in 2-3 wks  3,4. Hypertensive retinopathy OU - discussed importance of tight BP control - monitor  5. Pseudophakia OS  - s/p CE/IOL OS (R. Groat)  - IOL in excellent position  - pt reports "twitch" in vision OS since cataract surgery -- no pseudophakodonesis  - monitor  6. Mixed form age-related cataract OD  - The symptoms of cataract, surgical options, and treatments and risks were discussed with  patient.  - discussed diagnosis and progression  - likely visually significant, but pt is very reluctant to move forward with cataract surgery due to result of cataract surgery OS  - monitor for now   Ophthalmic Meds Ordered this visit:  Meds ordered this encounter  Medications  . brimonidine (ALPHAGAN) 0.2 % ophthalmic solution    Sig: Place 1 drop into the right eye 2 (two) times daily.    Dispense:  15 mL    Refill:  6  . dorzolamide-timolol (COSOPT) 22.3-6.8 MG/ML ophthalmic solution    Sig: Place 1 drop into the right eye 2 (two) times daily.    Dispense:  10 mL    Refill:  6       Return for f/u 2-3 weeks CRAO OD, DFE, OCT.  There are no Patient Instructions on file for this visit.   Explained the diagnoses, plan, and follow up with the patient and they expressed understanding.  Patient expressed understanding of the importance of proper follow up care.   This document serves as a record of services personally performed by Gardiner Sleeper, MD, PhD. It was created on their behalf by Ernest Mallick, OA, an ophthalmic assistant. The creation of this record is the provider's dictation and/or activities during the visit.    Electronically signed by: Ernest Mallick, OA 01.20.2021 5:19 PM   Gardiner Sleeper, M.D., Ph.D. Diseases & Surgery of the Retina and Vitreous [facility]  I  have reviewed the above documentation for accuracy and completeness, and I agree with the above. Gardiner Sleeper, M.D., Ph.D. 11/22/19 5:19 PM   Abbreviations: M myopia (nearsighted); A astigmatism; H hyperopia (farsighted); P presbyopia; Mrx spectacle prescription;  CTL contact lenses; OD right eye; OS left eye; OU both eyes  XT exotropia; ET esotropia; PEK punctate epithelial keratitis; PEE punctate epithelial erosions; DES dry eye syndrome; MGD meibomian gland dysfunction; ATs artificial tears; PFAT's preservative free artificial tears; [place] nuclear sclerotic cataract; PSC posterior subcapsular cataract; ERM epi-retinal membrane; PVD posterior vitreous detachment; RD retinal detachment; DM diabetes mellitus; DR diabetic retinopathy; NPDR non-proliferative diabetic retinopathy; PDR proliferative diabetic retinopathy; CSME clinically significant macular edema; DME diabetic macular edema; dbh dot blot hemorrhages; CWS cotton wool spot; POAG primary open angle glaucoma; C/D cup-to-disc ratio; HVF humphrey visual field; GVF goldmann visual field; OCT optical coherence tomography; IOP intraocular pressure; BRVO Branch retinal vein occlusion; CRVO central retinal vein occlusion; CRAO central retinal artery occlusion; BRAO branch retinal artery occlusion; RT retinal tear; SB scleral buckle; PPV pars plana vitrectomy; VH Vitreous hemorrhage; PRP panretinal laser photocoagulation; IVK intravitreal kenalog; VMT vitreomacular traction; MH Macular hole;  NVD neovascularization of the disc; NVE neovascularization elsewhere; AREDS age related eye disease study; ARMD age related macular degeneration; POAG primary open angle glaucoma; EBMD epithelial/anterior basement membrane dystrophy; ACIOL anterior chamber intraocular lens; IOL intraocular lens; PCIOL posterior chamber intraocular lens; Phaco/IOL phacoemulsification with intraocular lens placement; [place] photorefractive keratectomy; LASIK laser assisted in situ  keratomileusis; HTN hypertension; DM diabetes mellitus; COPD chronic obstructive pulmonary disease

## 2019-11-23 ENCOUNTER — Encounter (HOSPITAL_COMMUNITY): Payer: Self-pay | Admitting: Emergency Medicine

## 2019-11-23 ENCOUNTER — Emergency Department (HOSPITAL_COMMUNITY)
Admission: EM | Admit: 2019-11-23 | Discharge: 2019-11-23 | Disposition: A | Discharge status: Home / Self Care | Payer: Medicare Other | Source: Home / Self Care | Attending: Emergency Medicine | Admitting: Emergency Medicine

## 2019-11-23 ENCOUNTER — Emergency Department (HOSPITAL_COMMUNITY): Payer: Medicare Other

## 2019-11-23 ENCOUNTER — Other Ambulatory Visit: Payer: Self-pay

## 2019-11-23 ENCOUNTER — Ambulatory Visit (INDEPENDENT_AMBULATORY_CARE_PROVIDER_SITE_OTHER): Payer: Medicare Other | Admitting: Pharmacist Clinician (PhC)/ Clinical Pharmacy Specialist

## 2019-11-23 DIAGNOSIS — Z7901 Long term (current) use of anticoagulants: Secondary | ICD-10-CM | POA: Insufficient documentation

## 2019-11-23 DIAGNOSIS — Z79899 Other long term (current) drug therapy: Secondary | ICD-10-CM | POA: Insufficient documentation

## 2019-11-23 DIAGNOSIS — H5461 Unqualified visual loss, right eye, normal vision left eye: Secondary | ICD-10-CM

## 2019-11-23 DIAGNOSIS — H3411 Central retinal artery occlusion, right eye: Secondary | ICD-10-CM

## 2019-11-23 DIAGNOSIS — Z20822 Contact with and (suspected) exposure to covid-19: Secondary | ICD-10-CM | POA: Insufficient documentation

## 2019-11-23 DIAGNOSIS — Z87891 Personal history of nicotine dependence: Secondary | ICD-10-CM | POA: Insufficient documentation

## 2019-11-23 DIAGNOSIS — I4891 Unspecified atrial fibrillation: Secondary | ICD-10-CM | POA: Insufficient documentation

## 2019-11-23 DIAGNOSIS — I480 Paroxysmal atrial fibrillation: Secondary | ICD-10-CM

## 2019-11-23 DIAGNOSIS — J449 Chronic obstructive pulmonary disease, unspecified: Secondary | ICD-10-CM | POA: Insufficient documentation

## 2019-11-23 DIAGNOSIS — I110 Hypertensive heart disease with heart failure: Secondary | ICD-10-CM | POA: Insufficient documentation

## 2019-11-23 DIAGNOSIS — I5042 Chronic combined systolic (congestive) and diastolic (congestive) heart failure: Secondary | ICD-10-CM | POA: Insufficient documentation

## 2019-11-23 LAB — SARS CORONAVIRUS 2 (TAT 6-24 HRS): SARS Coronavirus 2: NEGATIVE

## 2019-11-23 MED ORDER — GADOBUTROL 1 MMOL/ML IV SOLN
10.0000 mL | Freq: Once | INTRAVENOUS | Status: AC | PRN
  Administered 2019-11-23: 10 mL via INTRAVENOUS

## 2019-11-23 MED ORDER — SODIUM CHLORIDE 0.9 % IV SOLN: INTRAVENOUS | Status: DC

## 2019-11-23 NOTE — ED Notes (Signed)
Pt leaving AMA. Pt signed stating she understands the risk of leaving. States she will be back tomorrow.

## 2019-11-23 NOTE — ED Notes (Signed)
Patient verbalizes understanding of discharge instructions. Opportunity for questioning and answers were provided. Armband removed by staff, pt discharged from ED.  

## 2019-11-23 NOTE — ED Provider Notes (Signed)
[facility] EMERGENCY DEPARTMENT Provider Note   CSN: [id number] Arrival date & time: 11/23/19  0754     History Chief Complaint  Patient presents with  . Eye Problem    Laurie Horton is a 74 y.o. female with PMHx CHF with EF 25-30%, COPD on chronic home O2, depression, GERD, HTN, HLD, PAF on Coumadin, fibromyalgia, previous stroke who presents to the ED today for sudden onset, constant, vision loss in right eye that began at 7:45 AM yesterday (11/22/2019).  She was seen by both her optometrist and the retinal specialist Dr. Coralyn Pear yesterday and sent to the ED for further work-up.  She was seen yesterday evening and had a CT head without contrast with no acute findings.  Plan was to admit to neurology with MRA head and neck given patient has allergy to contrast iodine.  Patient decided to leave AMA prior to additional work-up as she needed to go home and feed her dog.  She states she is returning today for further work-up.  She has someone who is currently taking care of her dog.  She states that she still cannot see completely out of her right eye however states that she can see a small portion of light.   The history is provided by the patient and medical records.       Past Medical History:  Diagnosis Date  . CHF (congestive heart failure) ([place]) 03/02/2017   EF 25-30% 2018  . Complication of anesthesia    various issues with oxygen  saturations post op  . COPD (chronic obstructive pulmonary disease) ([place])   . Depression   . Diverticulitis   . DVT (deep venous thrombosis) ([place])   . Fibromyalgia   . GERD (gastroesophageal reflux disease)   . Hiatal hernia   . History of deviated nasal septum    left- side  . HTN (hypertension)   . Hyperlipidemia   . Obesity   . On supplemental oxygen therapy    concentrator at night @ 1.5 l/m or when sleeps. O2 Sat niormally 87.  . Osteoporosis   . PAT (paroxysmal atrial tachycardia) ([place])   . PFO (patent foramen  ovale)   . PULMONARY NODULE, LEFT LOWER LOBE 10/14/2009   29mm LLL nodule dec 2010. Stable and 55mm in Oct 2012. No further fu  . PVD (peripheral vascular disease) with claudication ([place]) 12/2017  . Right middle lobe pneumonia 07/24/2011   First noted at admit 07/10/11. Persists on cxr 07/22/11. Cleared on CT 08/24/11. No further followup  . Stroke ([place])   . TOBACCO ABUSE 06/04/2009    Patient Active Problem List   Diagnosis Date Noted  . 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 Pain in right shoulder 11/08/2019  . Multiple drug allergies 09/15/2019  . Pain in left shoulder 06/21/2019  . Subclavian artery stenosis, left ([place]) 05/18/2019  . Claudication in peripheral vascular disease ([place]) 08/06/2017  . Class 1 obesity due to excess calories with serious comorbidity and body mass index (BMI) of 31.0 to 31.9 in adult 05/01/2017  . Chronic systolic CHF (congestive heart failure) ([place]) 03/04/2017  . Acute on chronic respiratory failure with hypoxia ([place]) 02/08/2017  . COPD, severe ([place]) 02/08/2017  . Acute diastolic heart failure, NYHA class 1 ([place]) 02/08/2017  . Fibromyalgia and chronic chest pain 02/08/2017  . Atrial fibrillation with RVR ([place]) 02/08/2017  . Acute heart failure ([place]) 02/08/2017  . Rapid atrial fibrillation ([place]) 02/08/2017  . Shortness of breath 02/08/2017  .  Moderate episode of recurrent major depressive disorder ([place]) 01/04/2017  . Generalized anxiety disorder 01/04/2017  . Pure hypercholesterolemia 12/28/2016  . 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 Acute blood loss anemia 08/03/2016  . Carotid stenosis 04/02/2015  . Long term current use of anticoagulant therapy 09/26/2014  . PAF (paroxysmal atrial fibrillation) ([place]) 09/19/2014  . 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 Chronic respiratory failure with hypoxia ([place]) 06/27/2014  . 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 Idiopathic  scoliosis and kyphoscoliosis 07/19/2013  . Hyperglycemia, drug-induced 10/12/2011  . Encounter for long-term (current) use of medications 08/21/2011  . Thrush 07/24/2011  . PULMONARY NODULE, LEFT LOWER LOBE 10/14/2009  . PATENT FORAMEN OVALE 10/14/2009  . Essential hypertension 06/04/2009  . G E R D 06/04/2009  . SNORING, HX OF 06/04/2009    Past Surgical History:  Procedure Laterality Date  . CARDIOVASCULAR STRESS TEST  12/26/2004   EF 74%. NO EVIDENCE OF ISCHEMIA  . ESOPHAGOGASTRODUODENOSCOPY (EGD) WITH PROPOFOL N/A 04/15/2015   Procedure: ESOPHAGOGASTRODUODENOSCOPY (EGD) WITH PROPOFOL;  Surgeon: Laurence Spates, MD;  Location: [facility];  Service: Endoscopy;  Laterality: N/A;  . KNEE ARTHROSCOPY  2000   left  . LAPAROSCOPIC CHOLECYSTECTOMY  04-16-2010   [place]  . LOWER EXTREMITY ANGIOGRAPHY N/A 09/09/2017   Procedure: Lower Extremity Angiography;  Surgeon: Lorretta Harp, MD;  Location: [place] [facility];  Service: Cardiovascular;  Laterality: N/A;  . LOWER EXTREMITY INTERVENTION Left 01/17/2018   Procedure: LOWER EXTREMITY INTERVENTION;  Surgeon: Lorretta Harp, MD;  Location: [place] [facility];  Service: Cardiovascular;  Laterality: Left;  . MOUTH SURGERY     03-26-15 multiple extractions stitches remains  . PERIPHERAL VASCULAR INTERVENTION Left 01/17/2018   Procedure: PERIPHERAL VASCULAR INTERVENTION;  Surgeon: Lorretta Harp, MD;  Location: [place] [facility];  Service: Cardiovascular;  Laterality: Left;  COMMON ILIAC  . RIGHT/LEFT HEART CATH AND CORONARY ANGIOGRAPHY N/A 03/04/2017   Procedure: Right/Left Heart Cath and Coronary Angiography;  Surgeon: Peter M Martinique, MD;  Location: [place] [facility];  Service: Cardiovascular;  Laterality: N/A;  . TOTAL ABDOMINAL HYSTERECTOMY     post op needed oxygen was told "she gave them a scare"  . TUBAL LIGATION    . US ECHOCARDIOGRAPHY  11/20/2009   EF 55-60%     OB History   No obstetric history on file.     Family History   Problem Relation Age of Onset  . Dementia Mother   . Diabetes Mother   . Alzheimer's disease Mother   . Heart attack Brother 66  . Heart attack Father   . Schizophrenia Sister   . Diabetes Sister   . Tremor Sister     Social History   Tobacco Use  . Smoking status: Former Smoker    Packs/day: 3.00    Years: 30.00    Pack years: 90.00    Types: Cigarettes    Quit date: 06/02/2010    Years since quitting: 9.4  . Smokeless tobacco: Never Used  . Tobacco comment: QUIT IN 2011  Substance Use Topics  . Alcohol use: No    Alcohol/week: 0.0 standard drinks  . Drug use: No    Home Medications Prior to Admission medications   Medication Sig Start Date End Date Taking? Authorizing Provider  brimonidine (ALPHAGAN) 0.2 % ophthalmic solution Place 1 drop into the right eye 2 (two) times daily. 11/22/19 11/21/20 Yes Bernarda Caffey, MD  Capsaicin (  ASPERCREME PAIN RELIEF PATCH) 0.025 % PADS Apply 1 patch topically every other day.   Yes [provider]  carvedilol (COREG) 12.5 MG tablet Take 1 tablet (12.5 mg total) by mouth 2 (two) times daily. 06/15/19 06/09/20 Yes Lorretta Harp, MD  desvenlafaxine (PRISTIQ) 50 MG 24 hr tablet Take 1 tablet (50 mg total) by mouth daily. 11/21/19  Yes Arfeen, Arlyce Harman, MD  diazepam (VALIUM) 2 MG tablet Take 1 tablet (2 mg total) by mouth daily as needed for anxiety. 11/21/19  Yes Arfeen, Arlyce Harman, MD  diclofenac sodium (VOLTAREN) 1 % GEL APPLY 2 GRAMS TO BOTH HANDS 4 TIMES A DAY 06/21/18  Yes [provider]  dorzolamide-timolol (COSOPT) 22.3-6.8 MG/ML ophthalmic solution Place 1 drop into the right eye 2 (two) times daily. 11/22/19 11/21/20 Yes Bernarda Caffey, MD  Eyelid Cleansers (OCUSOFT BABY EYELID & EYELASH EX) Apply topically.   Yes [provider]  furosemide (LASIX) 40 MG tablet Take 1 tablet (40 mg total) by mouth daily. 10/02/19  Yes Martinique, Peter M, MD  gabapentin (NEURONTIN) 100 MG capsule Take 100-200 mg by mouth See admin  instructions. 100 mg in am, 200 mg at noon and  200 mg at bedtime 09/16/16  Yes [provider]  HYDROcodone-acetaminophen (NORCO/VICODIN) 5-325 MG tablet Take 1 tablet by mouth 3 (three) times daily.    Yes [provider]  ipratropium (ATROVENT) 0.02 % nebulizer solution USE 1 VIAL BY NEBULIZATION 4 (FOUR) TIMES DAILY. 10/17/19  Yes Brand Males, MD  montelukast (SINGULAIR) 10 MG tablet TAKE 1 TABLET BY MOUTH EVERYDAY AT BEDTIME 11/09/19  Yes Rutherford Guys, MD  oxybutynin (DITROPAN-XL) 10 MG 24 hr tablet TAKE 1 TABLET BY MOUTH EVERY DAY 11/12/19  Yes Rutherford Guys, MD  pantoprazole (PROTONIX) 40 MG tablet Take 1 tablet (40 mg total) by mouth daily. 11/07/19  Yes Rutherford Guys, MD  polyethylene glycol [facility] Floria [facility]) packet Take 17 g by mouth every other day.    Yes [provider]  rosuvastatin (CRESTOR) 5 MG tablet TAKE 1 TABLET BY MOUTH 2 TIMES A WEEK. SUNDAY AND WEDNESDAY 08/15/19  Yes Lorretta Harp, MD  spironolactone (ALDACTONE) 25 MG tablet TAKE 1 TABLET BY MOUTH EVERY DAY 03/13/19  Yes Bhagat, Bhavinkumar, PA  tiZANidine (ZANAFLEX) 2 MG tablet Take 2 mg by mouth 3 (three) times daily. TAKE 1/2 TABLET IN THE AM, TAKE A 1/2 TABLET IN THE AFTERNOON TAKE A WHOLE TABLET AT BEDTIME   Yes [provider]  traZODone (DESYREL) 100 MG tablet Take one tab daily as needed for sleep 11/21/19  Yes Arfeen, Arlyce Harman, MD  Vitamin D, Ergocalciferol, (DRISDOL) 1.25 MG (50000 UNIT) CAPS capsule Take 1 capsule (50,000 Units total) by mouth every 7 (seven) days. 11/17/19  Yes Rutherford Guys, MD  warfarin (COUMADIN) 5 MG tablet TAKE 1/2 TO 1 TABLET BY MOUTH EVERY DAY OR AS DIRECTED BY COUMADIN CLINIC 10/20/19  Yes Martinique, Peter M, MD  ondansetron (ZOFRAN-ODT) 8 MG disintegrating tablet TAKE 1 TABLET BY MOUTH EVERY 8 HOURS AS NEEDED NAUSEA OR VOMITING Patient not taking: TAKE 1 TABLET BY MOUTH EVERY 8 HOURS AS NEEDED NAUSEA OR VOMITING 02/09/19   Rutherford Guys, MD    OXYGEN Inhale 1.5-2 L into the lungs as needed (for shortness of breath).    [provider]    Allergies    Alprazolam, Bee venom, Iodine, Pseudoephedrine hcl er, Budesonide-formoterol fumarate, Crestor [rosuvastatin calcium], Esomeprazole magnesium, Flonase [fluticasone propionate], Lamictal [  lamotrigine], Loratadine, Lotrimin [clotrimazole], Lunesta [eszopiclone], Oxcarbazepine, Statins, Zolpidem tartrate, Betadine [povidone iodine], Bevespi aerosphere [glycopyrrolate-formoterol], Clarithromycin, Effexor [venlafaxine], Lexapro [escitalopram oxalate], Aciphex [rabeprazole sodium], Alendronate sodium, Avelox [moxifloxacin hcl in nacl], Bextra [valdecoxib], Ceclor [cefaclor], Cephalexin, Covera-hs [verapamil hcl], Dicyclomine hcl, Other, and Tessalon perles  Review of Systems   Review of Systems  Constitutional: Negative for chills and fever.  Eyes: Positive for visual disturbance.  Respiratory: Negative for cough and shortness of breath.   Cardiovascular: Negative for chest pain.  Gastrointestinal: Negative for abdominal pain, nausea and vomiting.  Genitourinary: Negative for difficulty urinating.  Neurological: Negative for dizziness, syncope, speech difficulty, weakness, light-headedness, numbness and headaches.  All other systems reviewed and are negative.   Physical Exam Updated Vital Signs BP 95/67 (BP Location: Right Arm)   Pulse 97   Temp 98.4 F (36.9 C) (Oral)   Resp 16   SpO2 95%   Physical Exam Vitals and nursing note reviewed.  Constitutional:      Appearance: She is not ill-appearing or diaphoretic.  HENT:     Head: Normocephalic and atraumatic.  Eyes:     Extraocular Movements: Extraocular movements intact.     Pupils: Pupils are equal, round, and reactive to light.  Cardiovascular:     Rate and Rhythm: Normal rate and regular rhythm.     Pulses: Normal pulses.  Pulmonary:     Effort: Pulmonary effort is normal.     Breath sounds: Normal breath  sounds. No wheezing, rhonchi or rales.     Comments: On chronic home O2 Abdominal:     Palpations: Abdomen is soft.     Tenderness: There is no abdominal tenderness. There is no guarding or rebound.  Musculoskeletal:     Cervical back: Neck supple.  Skin:    General: Skin is warm and dry.  Neurological:     Mental Status: She is alert.     Comments: CN 3-12 grossly intact A&O x4 GCS 15 Sensation and strength intact Coordination with finger-to-nose WNL Neg romberg, neg pronator drift     ED Results / Procedures / Treatments   Labs (all labs ordered are listed, but only abnormal results are displayed) Labs Reviewed  SARS CORONAVIRUS 2 (TAT 6-24 HRS)    EKG None  Radiology CT HEAD WO CONTRAST  Result Date: 11/22/2019 CLINICAL DATA:  Focal neuro deficit, greater than 6 hours, stroke suspected. Additional history provided: Patient reports loss of vision in right eye since waking up at 7:45 this morning, some dizziness EXAM: CT HEAD WITHOUT CONTRAST TECHNIQUE: Contiguous axial images were obtained from the base of the skull through the vertex without intravenous contrast. COMPARISON:  Brain MRI 08/31/2015 FINDINGS: Brain: There is no evidence of acute intracranial hemorrhage. No acute demarcated cortical infarction is identified. Redemonstrated chronic left occipital lobe cortical/subcortical infarct. Unchanged chronic lacunar infarcts within bilateral cerebellar hemispheres. Background of advanced confluent hypoattenuation within the cerebral white matter which is nonspecific, but consistent with chronic small vessel ischemic disease. Unchanged prominent perivascular spaces within the bilateral basal ganglia. No evidence of intracranial mass. No midline shift or extra-axial fluid collection. Mild generalized parenchymal atrophy. Vascular: No hyperdense vessel. Atherosclerotic calcifications. Small symmetric anteromedial globe calcifications. Skull: Normal. Negative for fracture or focal  lesion. Sinuses/Orbits: Visualized orbits demonstrate no acute abnormality. No significant paranasal sinus disease or mastoid effusion at the imaged levels. IMPRESSION: No CT evidence of acute intracranial abnormality. Chronic left occipital lobe cortical/subcortical infarct. Advanced chronic small vessel ischemic disease. Redemonstrated chronic lacunar infarcts within bilateral  cerebellar hemispheres. Mild generalized parenchymal atrophy. Electronically Signed   By: Kellie Simmering DO   On: 11/22/2019 21:16   MR ANGIO HEAD WO CONTRAST  Result Date: 11/23/2019 CLINICAL DATA:  Right eye vision loss EXAM: MRI HEAD WITHOUT CONTRAST MRA HEAD WITHOUT CONTRAST MRA NECK WITHOUT AND WITH CONTRAST TECHNIQUE: Multiplanar, multiecho pulse sequences of the brain and surrounding structures were obtained without intravenous contrast. Angiographic images of the Circle of Willis were obtained using MRA technique without intravenous contrast. Angiographic images of the neck were obtained using MRA technique without and with intravenous contrast. Carotid stenosis measurements (when applicable) are obtained utilizing NASCET criteria, using the distal internal carotid diameter as the denominator. CONTRAST:  34mL GADAVIST GADOBUTROL 1 MMOL/ML IV SOLN COMPARISON:  MRI 2016 FINDINGS: MRI HEAD FINDINGS Brain: There is no acute infarction or intracranial hemorrhage. There is no intracranial mass, mass effect, or edema. There is no hydrocephalus or extra-axial fluid collection. There are chronic infarcts of the left occipital lobe, left occipitotemporal region, left greater than right cerebellum, right periatrial white matter, and basal ganglia. These are present on the prior study. Additional confluent areas of T2 hyperintensity in the supratentorial and pontine white matter are nonspecific but probably reflect moderate to advanced chronic microvascular ischemic changes. Prominence of the ventricles and sulci reflects mild generalized  parenchymal volume loss. Foci of susceptibility in the right subinsular region and left cerebellum likely reflect chronic blood products. Vascular: Major vessel flow voids at the skull base are preserved. Skull and upper cervical spine: Normal marrow signal is preserved. Sinuses/Orbits: Paranasal sinuses are aerated. Left lens replacement. Other: Sella is unremarkable.  Mastoid air cells are clear. MRA HEAD FINDINGS Intracranial internal carotid arteries are patent. Middle and anterior cerebral arteries are patent. Intracranial vertebral arteries, basilar artery, posterior cerebral arteries are patent. Probable atherosclerotic irregularity and moderate stenosis of the distal left vertebral artery. Bilateral posterior communicating arteries are present. There is fetal origin of the right posterior cerebral artery. There is no aneurysm. MRA NECK FINDINGS There is suspected severe stenosis at the left subclavian artery origin. Common, internal, and external carotid arteries are patent. There is plaque at the common carotid bifurcations and proximal internal carotids. Resulting less than 50% stenosis. Codominant extracranial vertebral arteries are patent. IMPRESSION: No acute infarction, hemorrhage, or mass. Stable chronic findings including microvascular ischemic changes and infarcts detailed above. Plaque at the ICA origins without hemodynamically significant stenosis. Focal moderate stenosis of the distal intracranial left vertebral artery. 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 Electronically Signed   By: Macy Mis M.D.   On: 11/23/2019 11:11   MR Angiogram Neck W or Wo Contrast  Result Date: 11/23/2019 CLINICAL DATA:  Right eye vision loss EXAM: MRI HEAD WITHOUT CONTRAST MRA HEAD WITHOUT CONTRAST MRA NECK WITHOUT AND WITH CONTRAST TECHNIQUE: Multiplanar, multiecho pulse sequences of the brain and  surrounding structures were obtained without intravenous contrast. Angiographic images of the Circle of Willis were obtained using MRA technique without intravenous contrast. Angiographic images of the neck were obtained using MRA technique without and with intravenous contrast. Carotid stenosis measurements (when applicable) are obtained utilizing NASCET criteria, using the distal internal carotid diameter as the denominator. CONTRAST:  25mL GADAVIST GADOBUTROL 1 MMOL/ML IV SOLN COMPARISON:  MRI 2016 FINDINGS: MRI HEAD FINDINGS Brain: There is no acute infarction or intracranial hemorrhage. There is no intracranial mass, mass effect, or edema. There is no hydrocephalus  or extra-axial fluid collection. There are chronic infarcts of the left occipital lobe, left occipitotemporal region, left greater than right cerebellum, right periatrial white matter, and basal ganglia. These are present on the prior study. Additional confluent areas of T2 hyperintensity in the supratentorial and pontine white matter are nonspecific but probably reflect moderate to advanced chronic microvascular ischemic changes. Prominence of the ventricles and sulci reflects mild generalized parenchymal volume loss. Foci of susceptibility in the right subinsular region and left cerebellum likely reflect chronic blood products. Vascular: Major vessel flow voids at the skull base are preserved. Skull and upper cervical spine: Normal marrow signal is preserved. Sinuses/Orbits: Paranasal sinuses are aerated. Left lens replacement. Other: Sella is unremarkable.  Mastoid air cells are clear. MRA HEAD FINDINGS Intracranial internal carotid arteries are patent. Middle and anterior cerebral arteries are patent. Intracranial vertebral arteries, basilar artery, posterior cerebral arteries are patent. Probable atherosclerotic irregularity and moderate stenosis of the distal left vertebral artery. Bilateral posterior communicating arteries are present. There  is fetal origin of the right posterior cerebral artery. There is no aneurysm. MRA NECK FINDINGS There is suspected severe stenosis at the left subclavian artery origin. Common, internal, and external carotid arteries are patent. There is plaque at the common carotid bifurcations and proximal internal carotids. Resulting less than 50% stenosis. Codominant extracranial vertebral arteries are patent. IMPRESSION: No acute infarction, hemorrhage, or mass. Stable chronic findings including microvascular ischemic changes and infarcts detailed above. Plaque at the ICA origins without hemodynamically significant stenosis. Focal moderate stenosis of the distal intracranial left vertebral artery. 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 Electronically Signed   By: Macy Mis M.D.   On: 11/23/2019 11:11   MR BRAIN WO CONTRAST  Result Date: 11/23/2019 CLINICAL DATA:  Right eye vision loss EXAM: MRI HEAD WITHOUT CONTRAST MRA HEAD WITHOUT CONTRAST MRA NECK WITHOUT AND WITH CONTRAST TECHNIQUE: Multiplanar, multiecho pulse sequences of the brain and surrounding structures were obtained without intravenous contrast. Angiographic images of the Circle of Willis were obtained using MRA technique without intravenous contrast. Angiographic images of the neck were obtained using MRA technique without and with intravenous contrast. Carotid stenosis measurements (when applicable) are obtained utilizing NASCET criteria, using the distal internal carotid diameter as the denominator. CONTRAST:  43mL GADAVIST GADOBUTROL 1 MMOL/ML IV SOLN COMPARISON:  MRI 2016 FINDINGS: MRI HEAD FINDINGS Brain: There is no acute infarction or intracranial hemorrhage. There is no intracranial mass, mass effect, or edema. There is no hydrocephalus or extra-axial fluid collection. There are chronic infarcts of the left occipital lobe, left occipitotemporal  region, left greater than right cerebellum, right periatrial white matter, and basal ganglia. These are present on the prior study. Additional confluent areas of T2 hyperintensity in the supratentorial and pontine white matter are nonspecific but probably reflect moderate to advanced chronic microvascular ischemic changes. Prominence of the ventricles and sulci reflects mild generalized parenchymal volume loss. Foci of susceptibility in the right subinsular region and left cerebellum likely reflect chronic blood products. Vascular: Major vessel flow voids at the skull base are preserved. Skull and upper cervical spine: Normal marrow signal is preserved. Sinuses/Orbits: Paranasal sinuses are aerated. Left lens replacement. Other: Sella is unremarkable.  Mastoid air cells are clear. MRA HEAD FINDINGS Intracranial internal carotid arteries are patent. Middle and anterior cerebral arteries are patent. Intracranial vertebral arteries, basilar artery, posterior cerebral arteries are patent. Probable atherosclerotic irregularity and moderate stenosis of  the distal left vertebral artery. Bilateral posterior communicating arteries are present. There is fetal origin of the right posterior cerebral artery. There is no aneurysm. MRA NECK FINDINGS There is suspected severe stenosis at the left subclavian artery origin. Common, internal, and external carotid arteries are patent. There is plaque at the common carotid bifurcations and proximal internal carotids. Resulting less than 50% stenosis. Codominant extracranial vertebral arteries are patent. IMPRESSION: No acute infarction, hemorrhage, or mass. Stable chronic findings including microvascular ischemic changes and infarcts detailed above. Plaque at the ICA origins without hemodynamically significant stenosis. Focal moderate stenosis of the distal intracranial left vertebral artery. 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 Appearance of the left  vertebral artery on neck MRA suggests subclavian steal. Electronically Signed   By: Macy Mis M.D.   On: 11/23/2019 11:11   OCT, Retina - OU - Both Eyes  Result Date: 11/22/2019 Right Eye Quality was good. Central Foveal Thickness: 305. Progression has no prior data. Findings include normal foveal contour, no IRF, no SRF, intraretinal hyper-reflective material (Diffuse inner retinal hyper reflectivity with small area of sparing nasal macula / peripappilary region). Left Eye Quality was good. Central Foveal Thickness: 367. Progression has no prior data. Findings include normal foveal contour, no IRF, no SRF. 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 CME - cystoid macular edema. PED - pigment epithelial detachment. IRF - intraretinal fluid. SRF - subretinal fluid. EZ - ellipsoid zone. ERM - epiretinal membrane. ORA - outer retinal atrophy. ORT - outer retinal tubulation. SRHM - subretinal hyper-reflective material   Fluorescein Angiography Optos (Transit OD)  Result Date: 11/22/2019 Right Eye Progression has no prior data. Early phase findings include delayed filling, vascular perfusion defect (Low signal - severely delayed filling time). Mid/Late phase findings include vascular perfusion defect (Low signal). Left Eye Progression has no prior data. Early phase findings include normal observations. Mid/Late phase findings include normal observations. Notes **Images stored on drive** Impression: OD: low fluorescein signal; delayed filling time and vascular perfusion defect consistent with CRAO OS: normal study   Procedures Procedures (including critical care time)  Medications Ordered in ED Medications  gadobutrol (GADAVIST) 1 MMOL/ML injection 10 mL (10 mLs Intravenous Contrast  Given 11/23/19 1044)    ED Course  I have reviewed the triage vital signs and the nursing notes.  Pertinent labs & imaging results that were available during my care of the patient were reviewed by me and considered in my medical decision making (see chart for details).  74 year old female who presents the ED today after leaving [place] yesterday with concern for central artery occlusion of her right eye.  She was sent for further evaluation from retinal specialist Dr. Coralyn Pear yesterday and had CT head done with no acute findings with plan for MRA head and neck and admission.  Patient left due to extensive wait time and not being updated by initial ED provider last night.  She returns today for MRIs.  Patient does have contrast allergy to iodine and cannot obtain CTA head and neck therefore warrants MRA head and neck per neurology recommendations yesterday.  Will obtain these, screen with Covid, plan for admission.  Will consult neurology.  Had lab work done less than 12 hours ago, do not feel she needs repeat lab work at this time.  INR yesterday 1.9, patient is working on becoming therapeutic.  She needs to continue taking her Coumadin.  MRIs with no acute findings today.  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  Will consult neurology at this time.   Clinical Course as of Nov 22 1204  Thu Nov 23, 2019  1135 Discussed case with Dr. Leonel Ramsay with neuro who has evaluated scans - reports that pt does not need to be admitted at this time given she is already on Coumadin and there is no intervention necessary inpatient. She is to continue Coumadin as she is almost therapeutic with INR 1.5 yesterday. Outpatient neuro follow up.    [MV]    Clinical Course User Index [MV] Eustaquio Maize, PA-C   Discharge home with close neuro follow-up.  She has been seeing Dr. Leonie Man in the past, advised to follow-up.  Advised to continue taking Coumadin and she will need to have her levels  rechecked in 1 to 2 weeks to ensure she is therapeutic.  She advised to follow-up with her PCP as well given her ED visit today.  She is in agreement with plan and stable for discharge home.  Covid test was obtained with plan for admission, patient advised to stay home and self isolate until she receives her results.   This note was prepared using Dragon voice recognition software and may include unintentional dictation errors due to the inherent limitations of voice recognition software.  MDM Rules/Calculators/A&P                       Final Clinical Impression(s) / ED Diagnoses Final diagnoses:  Vision loss of right eye  Central retinal artery occlusion of right eye    Rx / DC Orders ED Discharge Orders    None       Discharge Instructions     Please follow up with Dr. Leonie Man with Neurology regarding your ED visit today. There were no acute findings on the images today but you do have atherosclerosis/stenosis throughout that are more chronic in nature.   Continue taking your Coumadin as prescribed. You will need to have your INR level checked in 1-2 weeks to ensure it is in the therapeutic range   We have swabbed you for COVID with anticipation to admit today - you will receive a call IF you test positive. You will not hear from [place] if you test negative. You can log into your MyChart to check results (can take up to 1-2 days to return). If positive you will need to stay home and self isolate for 14 days starting today    ]    Eustaquio Maize, PA-C 11/23/19 1207    Elnora Morrison, MD 11/25/19 [phone]

## 2019-11-23 NOTE — Discharge Instructions (Addendum)
Please follow up with Dr. Leonie Man with Neurology regarding your ED visit today. There were no acute findings on the images today but you do have atherosclerosis/stenosis throughout that are more chronic in nature.   Continue taking your Coumadin as prescribed. You will need to have your INR level checked in 1-2 weeks to ensure it is in the therapeutic range   We have swabbed you for COVID with anticipation to admit today - you will receive a call IF you test positive. You will not hear from [place] if you test negative. You can log into your MyChart to check results (can take up to 1-2 days to return). If positive you will need to stay home and self isolate for 14 days starting today

## 2019-11-23 NOTE — ED Notes (Signed)
Patient transported to MRI 

## 2019-11-23 NOTE — ED Triage Notes (Signed)
Pt arrives to ED from home with complaints of loss of vision yesterday at 0745. Patient reports she was her yesterday and left AMA bc she had to check on her puppy. Patient states she is back today for MRI that needed to be done.

## 2019-11-23 NOTE — ED Notes (Signed)
Hooked patient back up to the pulse oxy patient is resting with call bell in reach

## 2019-11-24 ENCOUNTER — Telehealth: Payer: Self-pay | Admitting: Pharmacist Clinician (PhC)/ Clinical Pharmacy Specialist

## 2019-11-24 MED ORDER — CLOPIDOGREL BISULFATE 75 MG PO TABS: 75.0000 mg | ORAL_TABLET | Freq: Every day | ORAL | 5 refills | Status: DC

## 2019-11-24 NOTE — Telephone Encounter (Signed)
Spoke with patient, explained mechanism and side effects.  Patient agreeable to start this week.

## 2019-11-24 NOTE — Telephone Encounter (Signed)
-----   Message from Peter M Martinique, MD sent at 11/23/2019  5:07 PM EST ----- I think with her severe vascular disease that it might make more sense to add antiplatelet therapy to her coumadin. Plavix would probably have less bleeding risk than ASA if you think that is reasonable.   [name] ----- Message ----- From: Rockne Menghini, RPH-CPP Sent: 11/23/2019   3:46 PM EST To: Peter M Martinique, MD, #  Dr. 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  Her INR at the time was 1.9.  Would you like to increase INR goal to 2.5-3 or just leave it?  Erasmo Downer

## 2019-11-30 NOTE — Progress Notes (Signed)
[facility] Note  12/06/2019     CHIEF COMPLAINT Patient presents for Retina Follow Up   HISTORY OF PRESENT ILLNESS: Laurie Horton is a 74 y.o. female who presents to the clinic today for:   HPI    Retina Follow Up    Patient presents with  Other.  In right eye.  This started 2 weeks ago.  Severity is severe.  Duration of 2 weeks.  Since onset it is stable.  I, the attending physician,  performed the HPI with the patient and updated documentation appropriately.          Comments    74 y/o female pt here for 2 wk f/u for CRAO OD.  No change in [place] noticed OU.  Denies pain, flashes.  Sees "spiral objects" OD when eye is closed.  Brimonidine & Cosopt BID OD.  Was sent to [facility] per Dr. Coralyn Pear after last visit for MRI only, but pt reports that hospital team did both an MRI and cat scan.  Pt reports hospital team found no abnormalities on either scan.       Last edited by Bernarda Caffey, MD on 12/06/2019  9:36 AM. (History)    has appt with neurology 03.02.21, pt had ED visit on 01.20.21 and had CT head, MRI brain CTA head and neck, pt states there has been no change in her vision, but she is seeing "swirly things" when she closes her left eye, she feels like her right eye is moving outwards even if she is looking straight ahead, she has an appt with her cardiologist in 3 weeks, pt states she felt like she had another mini stroke this past Monday, she states the only person she told was her neighbor, and she did not seek medical care, she states she started taking Plavix the Saturday before and thinks it may have been a side effect of the medication  Referring physician: Rutherford Guys, MD [address] [place],  [place] [address]  HISTORICAL INFORMATION:   Selected notes from the MEDICAL RECORD NUMBER Referred by Dr. Martinique DeMarco for concern of artery occlusion OD LEE: 01.20.21 (J. DeMarco) Ocular Hx- PMH-   CURRENT MEDICATIONS: Current  Outpatient Medications (Ophthalmic Drugs)  Medication Sig  . brimonidine (ALPHAGAN) 0.2 % ophthalmic solution Place 1 drop into the right eye 2 (two) times daily.  . dorzolamide-timolol (COSOPT) 22.3-6.8 MG/ML ophthalmic solution Place 1 drop into the right eye 2 (two) times daily.   No current facility-administered medications for this visit. (Ophthalmic Drugs)   Current Outpatient Medications (Other)  Medication Sig  . Capsaicin (ASPERCREME PAIN RELIEF PATCH) 0.025 % PADS Apply 1 patch topically every other day.  . carvedilol (COREG) 12.5 MG tablet Take 1 tablet (12.5 mg total) by mouth 2 (two) times daily.  . clopidogrel (PLAVIX) 75 MG tablet Take 1 tablet (75 mg total) by mouth daily.  Marland Kitchen desvenlafaxine (PRISTIQ) 50 MG 24 hr tablet Take 1 tablet (50 mg total) by mouth daily.  . diazepam (VALIUM) 2 MG tablet Take 1 tablet (2 mg total) by mouth daily as needed for anxiety.  . diclofenac sodium (VOLTAREN) 1 % GEL APPLY 2 GRAMS TO BOTH HANDS 4 TIMES A DAY  . Eyelid Cleansers (OCUSOFT BABY EYELID & EYELASH EX) Apply topically.  . furosemide (LASIX) 40 MG tablet Take 1 tablet (40 mg total) by mouth daily.  Marland Kitchen gabapentin (NEURONTIN) 100 MG capsule Take 200 mg by mouth See admin instructions.  200 mg at noon and  200 mg at bedtime  . HYDROcodone-acetaminophen (NORCO/VICODIN) 5-325 MG tablet Take 1 tablet by mouth 3 (three) times daily.   Marland Kitchen ipratropium (ATROVENT) 0.02 % nebulizer solution USE 1 VIAL BY NEBULIZATION 4 (FOUR) TIMES DAILY.  . montelukast (SINGULAIR) 10 MG tablet TAKE 1 TABLET BY MOUTH EVERYDAY AT BEDTIME  . ondansetron (ZOFRAN-ODT) 8 MG disintegrating tablet TAKE 1 TABLET BY MOUTH EVERY 8 HOURS AS NEEDED NAUSEA OR VOMITING  . oxybutynin (DITROPAN-XL) 10 MG 24 hr tablet TAKE 1 TABLET BY MOUTH EVERY DAY (Patient taking differently: Take 10 mg by mouth daily. )  . OXYGEN Inhale 1.5-2 L into the lungs as needed (for shortness of breath).  . pantoprazole (PROTONIX) 40 MG tablet Take 1 tablet  (40 mg total) by mouth daily.  . polyethylene glycol (MIRALAX / GLYCOLAX) packet Take 17 g by mouth every other day.   . rosuvastatin (CRESTOR) 5 MG tablet TAKE 1 TABLET BY MOUTH 2 TIMES A WEEK. SUNDAY AND WEDNESDAY  . spironolactone (ALDACTONE) 25 MG tablet TAKE 1 TABLET BY MOUTH EVERY DAY  . tiZANidine (ZANAFLEX) 2 MG tablet Take 2 mg by mouth at bedtime.   . traZODone (DESYREL) 100 MG tablet Take one tab daily as needed for sleep  . Vitamin D, Ergocalciferol, (DRISDOL) 1.25 MG (50000 UNIT) CAPS capsule Take 1 capsule (50,000 Units total) by mouth every 7 (seven) days.  Marland Kitchen warfarin (COUMADIN) 5 MG tablet TAKE 1/2 TO 1 TABLET BY MOUTH EVERY DAY OR AS DIRECTED BY COUMADIN CLINIC (Patient taking differently: Take 2.5-5 mg by mouth See admin instructions. Take 2.5 mg on Friday, take 5 mg on all other days.)   No current facility-administered medications for this visit. (Other)      REVIEW OF SYSTEMS: ROS    Positive for: Gastrointestinal, Musculoskeletal, Cardiovascular, Eyes, Respiratory   Negative for: Constitutional, Neurological, Skin, Genitourinary, HENT, Endocrine, Psychiatric, Allergic/Imm, Heme/Lymph   Last edited by Matthew Folks, COA on 12/06/2019  9:02 AM. (History)       ALLERGIES Allergies  Allergen Reactions  . Alprazolam Anaphylaxis and Other (See Comments)    REACTION: stops breathing  . Bee Venom Anaphylaxis  . Iodine Anaphylaxis, Swelling and Other (See Comments)    REACTION: swelling in throat  . Pseudoephedrine Hcl Er Shortness Of Breath  . Budesonide-Formoterol Fumarate Other (See Comments)    Blisters inside of mouth all over  . Crestor [Rosuvastatin Calcium] Other (See Comments)    Unable to walk  . Esomeprazole Magnesium Other (See Comments)    REACTION: "bouncing off walls"  . Flonase [Fluticasone Propionate] Other (See Comments)    NOSE BLEED  . Lamictal [Lamotrigine] Rash    Patient got rash, labored breathing, and diarrhea  . Loratadine Other (See  Comments)    claritin D causes shaking  . Lotrimin [Clotrimazole] Other (See Comments)    Mouth blisters  . Lunesta [Eszopiclone] Other (See Comments)    REACTION: "slept for a week"  . Oxcarbazepine Other (See Comments)    Causes deep sleep and dizziness  . Statins Other (See Comments)    Can't walk, legs won't work   . Zolpidem Tartrate Other (See Comments)    REACTION: "slept for a week"  . Betadine [Povidone Iodine] Other (See Comments)    Breathing problems  . Bevespi Aerosphere [Glycopyrrolate-Formoterol] Other (See Comments)    Pt believes this caused mouth sores and thrush   . Clarithromycin Other (See Comments)    All "mycins", Puts  into "a" fib, Will take if has to for severe sinus infection  . Effexor [Venlafaxine] Nausea And Vomiting and Other (See Comments)    cramps  . Lexapro [Escitalopram Oxalate] Other (See Comments)    hallucinations  . Aciphex [Rabeprazole Sodium] Rash  . Alendronate Sodium Other (See Comments)    "caused stomach problems for 3 days"  . Avelox [Moxifloxacin Hcl In Nacl] Other (See Comments)    Stomach cramps.   Darlin Coco [Valdecoxib] Rash  . Ceclor [Cefaclor] Rash  . Cephalexin Rash and Other (See Comments)    Pt states that she is possibly allergic to this - had a reaction to Cefaclor in the past and she does not want to these class drugs. Added per patient request.  . Covera-Hs [Verapamil Hcl] Palpitations  . Dicyclomine Hcl Rash  . Other Other (See Comments)    Glue from ekg/heart monitor leads --rash, Any MYCINS  . Tessalon Perles Rash    PAST MEDICAL HISTORY Past Medical History:  Diagnosis Date  . Cataract    OD  . CHF (congestive heart failure) ([place]) 03/02/2017   EF 25-30% 2018  . Complication of anesthesia    various issues with oxygen  saturations post op  . COPD (chronic obstructive pulmonary disease) ([place])   . Depression   . Diverticulitis   . DVT (deep venous thrombosis) ([place])   . Fibromyalgia   . GERD  (gastroesophageal reflux disease)   . Hiatal hernia   . History of deviated nasal septum    left- side  . HTN (hypertension)   . Hyperlipidemia   . Hypertensive retinopathy    OU  . Obesity   . On supplemental oxygen therapy    concentrator at night @ 1.5 l/m or when sleeps. O2 Sat niormally 87.  . Osteoporosis   . PAT (paroxysmal atrial tachycardia) ([place])   . PFO (patent foramen ovale)   . PULMONARY NODULE, LEFT LOWER LOBE 10/14/2009   41mm LLL nodule dec 2010. Stable and 65mm in Oct 2012. No further fu  . PVD (peripheral vascular disease) with claudication ([place]) 12/2017  . Right middle lobe pneumonia 07/24/2011   First noted at admit 07/10/11. Persists on cxr 07/22/11. Cleared on CT 08/24/11. No further followup  . Stroke ([place])   . TOBACCO ABUSE 06/04/2009   Past Surgical History:  Procedure Laterality Date  . CARDIOVASCULAR STRESS TEST  12/26/2004   EF 74%. NO EVIDENCE OF ISCHEMIA  . CATARACT EXTRACTION Left    Dr. Elliot Dally  . ESOPHAGOGASTRODUODENOSCOPY (EGD) WITH PROPOFOL N/A 04/15/2015   Procedure: ESOPHAGOGASTRODUODENOSCOPY (EGD) WITH PROPOFOL;  Surgeon: Laurence Spates, MD;  Location: [facility];  Service: Endoscopy;  Laterality: N/A;  . EYE SURGERY Left    Cat Sx  . KNEE ARTHROSCOPY  2000   left  . LAPAROSCOPIC CHOLECYSTECTOMY  04-16-2010   [place]  . LOWER EXTREMITY ANGIOGRAPHY N/A 09/09/2017   Procedure: Lower Extremity Angiography;  Surgeon: Lorretta Harp, MD;  Location: [facility];  Service: Cardiovascular;  Laterality: N/A;  . LOWER EXTREMITY INTERVENTION Left 01/17/2018   Procedure: LOWER EXTREMITY INTERVENTION;  Surgeon: Lorretta Harp, MD;  Location: [place] [facility];  Service: Cardiovascular;  Laterality: Left;  . MOUTH SURGERY     03-26-15 multiple extractions stitches remains  . PERIPHERAL VASCULAR INTERVENTION Left 01/17/2018   Procedure: PERIPHERAL VASCULAR INTERVENTION;  Surgeon: Lorretta Harp, MD;  Location: [facility];  Service:  Cardiovascular;  Laterality: Left;  COMMON ILIAC  .  RIGHT/LEFT HEART CATH AND CORONARY ANGIOGRAPHY N/A 03/04/2017   Procedure: Right/Left Heart Cath and Coronary Angiography;  Surgeon: Peter M Martinique, MD;  Location: [facility];  Service: Cardiovascular;  Laterality: N/A;  . TOTAL ABDOMINAL HYSTERECTOMY     post op needed oxygen was told "she gave them a scare"  . TUBAL LIGATION    . US ECHOCARDIOGRAPHY  11/20/2009   EF 55-60%    FAMILY HISTORY Family History  Problem Relation Age of Onset  . Dementia Mother   . Diabetes Mother   . Alzheimer's disease Mother   . Heart attack Brother 12  . Heart attack Father   . Schizophrenia Sister   . Diabetes Sister   . Tremor Sister     SOCIAL HISTORY Social History   Tobacco Use  . Smoking status: Former Smoker    Packs/day: 3.00    Years: 30.00    Pack years: 90.00    Types: Cigarettes    Quit date: 06/02/2010    Years since quitting: 9.5  . Smokeless tobacco: Never Used  . Tobacco comment: QUIT IN 2011  Substance Use Topics  . Alcohol use: No    Alcohol/week: 0.0 standard drinks  . Drug use: No         OPHTHALMIC EXAM:  Base Eye Exam    Visual Acuity (Snellen - Linear)      Right Left   Dist [place] HM 20/30   Dist ph [place] NI NI       Tonometry (Tonopen, 9:06 AM)      Right Left   Pressure 8 10       Pupils      Dark Light Shape React APD   Right 3 3 Round Minimal None   Left 3 2 Round Brisk None       Visual Fields (Counting fingers)      Left Right    Full    Restrictions  Total superior temporal, inferior temporal, superior nasal, inferior nasal deficiencies       Extraocular Movement      Right Left    Full, Ortho Full, Ortho       Neuro/Psych    Oriented x3: Yes   Mood/Affect: Normal       Dilation    Both eyes: 1.0% Mydriacyl, 2.5% Phenylephrine @ 9:06 AM        Slit Lamp and Fundus Exam    Slit Lamp Exam      Right Left   Lids/Lashes Dermatochalasis - upper lid, Meibomian gland  dysfunction Dermatochalasis - upper lid, Meibomian gland dysfunction   Conjunctiva/Sclera Nasal Pinguecula; otherwise white and quiet Nasal Pinguecula; otherwise white and quiet   Cornea Arcus Arcus, 2+ Punctate epithelial erosions, well healed temporal cataract wounds   Anterior Chamber deep, narrow temporal angle, no NVI deep, narrow temporal angle   Iris Round and dilated Round and dilated   Lens 3+ Nuclear sclerosis with brunescence, 2-3+ Cortical cataract PC IOL in good position, 1-2+ Posterior capsular opacification   Vitreous Vitreous syneresis Vitreous syneresis       Fundus Exam      Right Left   Disc Pink and Sharp, disc hemes at 0730 and 0800 Pink and Sharp   C/D Ratio 0.1 0.1   Macula Flat, Good foveal reflex; retinal whitening with cherry red spot, cilioretinal sparing peripapillary nasal macula Flat, Blunted foveal reflex, mild Retinal pigment epithelial mottling, No heme or edema   Vessels Severe Vascular attenuation, but no  box-carring, AV crossing changes Vascular attenuation, Tortuous, mild AV crossing changes   Periphery Attached, mild reticular degeneration, No heme  Attached, mild reticular degeneration, No heme           IMAGING AND PROCEDURES  Imaging and Procedures for @TODAY @  OCT, Retina - OU - Both Eyes       Right Eye Quality was good. Central Foveal Thickness: 339. Progression has worsened. Findings include no IRF, no SRF, intraretinal hyper-reflective material, abnormal foveal contour (Interval increase in IRHM (brighter); interval thickening of nasal fovea).   Left Eye Quality was good. Central Foveal Thickness: 362. Progression has been stable. Findings include normal foveal contour, no IRF, no SRF.   Notes *Images captured and stored on drive  Diagnosis / Impression:  OD: Interval increase in IRHM (brighter); interval thickening of nasal fovea OS: NFP, no IRF/SRF  Clinical management:  See below  Abbreviations: NFP - Normal foveal profile.  CME - cystoid macular edema. PED - pigment epithelial detachment. IRF - intraretinal fluid. SRF - subretinal fluid. EZ - ellipsoid zone. ERM - epiretinal membrane. ORA - outer retinal atrophy. ORT - outer retinal tubulation. SRHM - subretinal hyper-reflective material                 ASSESSMENT/PLAN:    ICD-10-CM   1. Central retinal artery occlusion of right eye  H34.11   2. Retinal edema  H35.81 OCT, Retina - OU - Both Eyes  3. Hypertensive retinopathy of both eyes  H35.033   4. Essential hypertension  I10   5. Pseudophakia  Z96.1   6. Combined forms of age-related cataract of right eye  H25.811     1,2. Central retinal artery occlusion (CRAO) OD  - pt had acute painless vision loss OD upon waking 1.20.2021  - initially presented to Dr. 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, and pt is on Warfarin  - on exam, BCVA HM with retinal whitening and central cherry red spot  - IOP 08 on brimonidine and Cosopt  - discussed findings, guarded prognosis, treatment options  - pt presented to ED (01.20.21) for urgent MRI/MRA and stroke work up -- was discharged with Neurology f/u  - has appt with neurology 03.2.21  - CT head, MRI brain, MRA head and neck done 1.21.21 in ED -- see reports below -- no significant acute changes from prior studies  - recommend keeping intraocular pressure low in right eye to promote perfusion of the retina  - pt is well established with cardiology and vascular surgery--optimization of CV risk factors  - continue brimonidine and Cosopt BID OD  - f/u around February 20, 2020  3,4. Hypertensive retinopathy OU  - discussed importance of tight BP control  - monitor  5. Pseudophakia OS  - s/p CE/IOL OS (R. Groat)  - IOL in excellent position  - pt reports "twitch" in vision OS since cataract surgery -- no pseudophakodonesis  - monitor  6. 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  - discussed diagnosis and progression  - likely visually significant, but pt is very reluctant to move forward with cataract surgery due to result of cataract surgery OS  - monitor for now   Ophthalmic Meds Ordered this visit:  No orders of the defined types were placed in this encounter.  Return in about 11 weeks (around 02/21/2020) for f/u CRAO OD, DFE, OCT.  There are no Patient Instructions on file for this visit.   Explained the diagnoses, plan, and follow up with the patient and they expressed understanding.  Patient expressed understanding of the importance of proper follow up care.   This document serves as a record of services personally performed by Gardiner Sleeper, MD, PhD. It was created on their behalf by Roselee Nova, COMT. The creation of this record is the provider's dictation and/or activities during the visit.  Electronically signed by: Roselee Nova, COMT 12/06/19 11:35 PM   This document serves as a record of services personally performed by Gardiner Sleeper, MD, PhD. It was created on their behalf by Ernest Mallick, OA, an ophthalmic assistant. The creation of this record is the provider's dictation and/or activities during the visit.    Electronically signed by: Ernest Mallick, OA 02.03.2021 11:35 PM   Gardiner Sleeper, M.D., Ph.D. Diseases & Surgery of the Retina and [place] 12/06/2019   I have reviewed the above documentation for accuracy and completeness, and I agree with the above. Gardiner Sleeper, M.D., Ph.D. 12/06/19 11:35 PM   Abbreviations: M myopia (nearsighted); A astigmatism; H hyperopia (farsighted); P presbyopia; Mrx spectacle prescription;  CTL contact lenses; OD right eye; OS left eye; OU both eyes  XT exotropia; ET esotropia; PEK punctate epithelial keratitis; PEE punctate  epithelial erosions; DES dry eye syndrome; MGD meibomian gland dysfunction; ATs artificial tears; PFAT's preservative free artificial tears; [place] nuclear sclerotic cataract; PSC posterior subcapsular cataract; ERM epi-retinal membrane; PVD posterior vitreous detachment; RD retinal detachment; DM diabetes mellitus; DR diabetic retinopathy; NPDR non-proliferative diabetic retinopathy; PDR proliferative diabetic retinopathy; CSME clinically significant macular edema; DME diabetic macular edema; dbh dot blot hemorrhages; CWS cotton wool spot; POAG primary open angle glaucoma; C/D cup-to-disc ratio; HVF humphrey visual field; GVF goldmann visual field; OCT optical coherence tomography; IOP intraocular pressure; BRVO Branch retinal vein occlusion; CRVO central retinal vein occlusion; CRAO central retinal artery occlusion; BRAO branch retinal artery occlusion; RT retinal tear; SB scleral buckle; PPV pars plana vitrectomy; VH Vitreous hemorrhage; PRP panretinal laser photocoagulation; IVK intravitreal kenalog; VMT vitreomacular traction; MH Macular hole;  NVD neovascularization of the disc; NVE neovascularization elsewhere; AREDS age related eye disease study; ARMD age related macular degeneration; POAG primary open angle glaucoma; EBMD epithelial/anterior basement membrane dystrophy; ACIOL anterior chamber intraocular lens; IOL intraocular lens; PCIOL posterior chamber intraocular lens; Phaco/IOL phacoemulsification with intraocular lens placement; PRK photorefractive keratectomy; LASIK laser assisted in situ keratomileusis; HTN hypertension; DM diabetes mellitus; COPD chronic obstructive pulmonary disease  ===========================================  MRI Brain w/o contrast, MRA Head and Neck -- 01.21.2021  CLINICAL DATA:  Right eye vision loss  EXAM: MRI HEAD WITHOUT CONTRAST  MRA HEAD WITHOUT CONTRAST  MRA NECK WITHOUT AND WITH CONTRAST  TECHNIQUE: Multiplanar, multiecho pulse sequences of the brain  and surrounding structures were obtained without intravenous contrast. Angiographic images of the Circle of Willis were obtained using MRA technique without intravenous contrast. Angiographic images of the neck were obtained using MRA technique without and with intravenous contrast. Carotid stenosis measurements (when applicable) are obtained utilizing NASCET criteria, using the distal internal carotid diameter as the denominator.  CONTRAST:  54mL GADAVIST GADOBUTROL 1 MMOL/ML IV SOLN  COMPARISON:  MRI 2016  FINDINGS: MRI HEAD FINDINGS  Brain: There is no acute infarction or intracranial hemorrhage. There is no intracranial mass, mass effect, or edema. There  is no hydrocephalus or extra-axial fluid collection.  There are chronic infarcts of the left occipital lobe, left occipitotemporal region, left greater than right cerebellum, right periatrial white matter, and basal ganglia. These are present on the prior study. Additional confluent areas of T2 hyperintensity in the supratentorial and pontine white matter are nonspecific but probably reflect moderate to advanced chronic microvascular ischemic changes. Prominence of the ventricles and sulci reflects mild generalized parenchymal volume loss. Foci of susceptibility in the right subinsular region and left cerebellum likely reflect chronic blood products.  Vascular: Major vessel flow voids at the skull base are preserved.  Skull and upper cervical spine: Normal marrow signal is preserved.  Sinuses/Orbits: Paranasal sinuses are aerated. Left lens replacement.  Other: Sella is unremarkable.  Mastoid air cells are clear.  MRA HEAD FINDINGS  Intracranial internal carotid arteries are patent. Middle and anterior cerebral arteries are patent. Intracranial vertebral arteries, basilar artery, posterior cerebral arteries are patent. Probable atherosclerotic irregularity and moderate stenosis of the distal left vertebral  artery. Bilateral posterior communicating arteries are present. There is fetal origin of the right posterior cerebral artery. There is no aneurysm.  MRA NECK FINDINGS  There is suspected severe stenosis at the left subclavian artery origin. Common, internal, and external carotid arteries are patent. There is plaque at the common carotid bifurcations and proximal internal carotids. Resulting less than 50% stenosis.  Codominant extracranial vertebral arteries are patent.  IMPRESSION: No acute infarction, hemorrhage, or mass. Stable chronic findings including microvascular ischemic changes and infarcts detailed above.  Plaque at the ICA origins without hemodynamically significant stenosis.  Focal moderate stenosis of the distal intracranial left vertebral artery.  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

## 2019-12-02 ENCOUNTER — Other Ambulatory Visit: Payer: Self-pay | Admitting: Family Medicine

## 2019-12-02 NOTE — Telephone Encounter (Signed)
Requested medication (s) are due for refill today: yes  Requested medication (s) are on the active medication list: yes  Last refill:  02/07/19  Future visit scheduled: no  Notes to clinic:  Not delegated    Requested Prescriptions  Pending Prescriptions Disp Refills   ondansetron (ZOFRAN-ODT) 8 MG disintegrating tablet [Pharmacy Med Name: ONDANSETRON ODT 8 MG TABLET] 20 tablet 0    Sig: TAKE 1 TABLET BY MOUTH EVERY 8 HOURS AS NEEDED NAUSEA OR VOMITING      Not Delegated - Gastroenterology: Antiemetics Failed - 12/02/2019  8:46 AM      Failed - This refill cannot be delegated      Passed - Valid encounter within last 6 months    Recent Outpatient Visits           2 weeks ago Vitamin D deficiency   [facility], Lilia Argue, MD   3 weeks ago Diarrhea, unspecified type   [facility], Lilia Argue, MD   5 months ago Hyperglycemia, drug-induced   [facility], Arlie Solomons, MD   6 months ago Candidal intertrigo   [facility], Lilia Argue, MD   1 year ago Hyperglycemia, drug-induced   [facility], Lilia Argue, MD       Future Appointments             In 2 months [place], Ander Slade, MD [place] [facility], [facility]

## 2019-12-04 ENCOUNTER — Ambulatory Visit: Payer: Medicare Other | Admitting: Family Medicine

## 2019-12-06 ENCOUNTER — Encounter (INDEPENDENT_AMBULATORY_CARE_PROVIDER_SITE_OTHER): Payer: Self-pay | Admitting: Ophthalmology

## 2019-12-06 ENCOUNTER — Ambulatory Visit (INDEPENDENT_AMBULATORY_CARE_PROVIDER_SITE_OTHER): Payer: Medicare Other | Admitting: Ophthalmology

## 2019-12-06 DIAGNOSIS — Z961 Presence of intraocular lens: Secondary | ICD-10-CM

## 2019-12-06 DIAGNOSIS — H35033 Hypertensive retinopathy, bilateral: Secondary | ICD-10-CM

## 2019-12-06 DIAGNOSIS — H3581 Retinal edema: Secondary | ICD-10-CM | POA: Diagnosis not present

## 2019-12-06 DIAGNOSIS — H3411 Central retinal artery occlusion, right eye: Secondary | ICD-10-CM | POA: Diagnosis not present

## 2019-12-06 DIAGNOSIS — H25811 Combined forms of age-related cataract, right eye: Secondary | ICD-10-CM

## 2019-12-06 DIAGNOSIS — I10 Essential (primary) hypertension: Secondary | ICD-10-CM

## 2019-12-08 ENCOUNTER — Ambulatory Visit (INDEPENDENT_AMBULATORY_CARE_PROVIDER_SITE_OTHER): Payer: Medicare Other | Admitting: Pharmacist

## 2019-12-08 ENCOUNTER — Other Ambulatory Visit: Payer: Self-pay

## 2019-12-08 DIAGNOSIS — Z7901 Long term (current) use of anticoagulants: Secondary | ICD-10-CM

## 2019-12-08 DIAGNOSIS — I480 Paroxysmal atrial fibrillation: Secondary | ICD-10-CM

## 2019-12-08 LAB — POCT INR: INR: 2.4 (ref 2.0–3.0)

## 2019-12-14 ENCOUNTER — Ambulatory Visit (INDEPENDENT_AMBULATORY_CARE_PROVIDER_SITE_OTHER): Payer: Medicare Other | Admitting: Family Medicine

## 2019-12-14 VITALS — BP 95/67 | Ht 64.0 in | Wt 190.0 lb

## 2019-12-14 DIAGNOSIS — Z0000 Encounter for general adult medical examination without abnormal findings: Secondary | ICD-10-CM

## 2019-12-14 NOTE — Progress Notes (Signed)
Presents today for [employer] Visit   Date of last exam: 11/07/2019  Interpreter used for this visit?  No  I connected with  [name] on 12/14/19 by a  telephone and verified that I am speaking with the correct person using two identifiers.   I discussed the limitations of evaluation and management by telemedicine. The patient expressed understanding and agreed to proceed.    Patient Care Team: Rutherford Guys, MD as PCP - General (Family Medicine) Lorretta Harp, MD as PCP - Cardiology (Cardiology) Margaretha Sheffield, MD as Referring Physician (Physical Medicine and Rehabilitation) Laurence Spates, MD (Inactive) as Consulting Physician (Gastroenterology) Garvin Fila, MD as Consulting Physician (Neurology) Martinique, Peter M, MD as Consulting Physician (Cardiology) Brand Males, MD as Consulting Physician (Pulmonary Disease) Adele Schilder Arlyce Harman, MD as Consulting Physician (Psychiatry) Lorretta Harp, MD as Consulting Physician (Peripheral Vascular Disease)   Other items to address today:  Discussed immunization Never gets flu-shot Discussed Eye/Dental   Other Screening: Last screening for diabetes:06/14/2019 Last lipid screening:11/13/2019  ADVANCE DIRECTIVES: Discussed: yes On File: no Materials Provided: yes  Immunization status:  Immunization History  Administered Date(s) Administered  . Influenza Split 08/03/2011, 08/01/2012  . Influenza,inj,Quad PF,6+ Mos 08/14/2013, 09/03/2014, 09/04/2015, 08/18/2016  . Influenza-Unspecified 11/03/1999  . Pneumococcal Conjugate-13 10/01/2014  . Pneumococcal Polysaccharide-23 09/03/1999, 06/02/2006, 08/14/2013  . Td 11/02/1994  . Tdap 08/18/2016     Health Maintenance Due  Topic Date Due  . MAMMOGRAM  09/29/2018     Functional Status Survey: Is the patient deaf or have difficulty hearing?: No Does the patient have difficulty seeing, even when wearing glasses/contacts?: No Does the  patient have difficulty concentrating, remembering, or making decisions?: No Does the patient have difficulty walking or climbing stairs?: Yes Does the patient have difficulty dressing or bathing?: No Does the patient have difficulty doing errands alone such as visiting a doctor's office or shopping?: No   6CIT Screen 12/14/2019  What Year? 0 points  What month? 0 points  What time? 0 points  Count back from 20 0 points  Months in reverse 0 points  Repeat phrase 0 points  Total Score 0        Clinical Support from 12/14/2019 in [facility]  AUDIT-C Score  0       Home Environment:  Lives in town home two story No scattered rugs No grab bars Adequate lighting/ no clutter No trouble climbing stairs  (just a little from dizziness)     Children: 3 biological children; 3 court appointed children; 6 grandchildren; 5 gg    Lives: alone with 1 dog two cats Will be re homing dog due to tripping issue for patient  Employment: retired age 16; disability for COPD, CVA at age 37.        ADLs: drives; no assistant devices; does have a walker.   Does own grocery shopping.  Cleans just takes her longer because of resting with Oxygeyn   Patient Active Problem List   Diagnosis Date Noted  . 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 Pain in right shoulder 11/08/2019  . Multiple drug allergies 09/15/2019  . Pain in left shoulder 06/21/2019  . Subclavian artery stenosis, left ([place]) 05/18/2019  . Claudication in peripheral vascular disease ([place]) 08/06/2017  . Class 1 obesity due to excess calories with serious comorbidity and body mass index (BMI) of 31.0 to 31.9 in adult 05/01/2017  . Chronic systolic CHF (  congestive heart failure) ([place]) 03/04/2017  . Acute on chronic respiratory failure with hypoxia ([place]) 02/08/2017  . COPD, severe ([place]) 02/08/2017  . Acute diastolic heart failure, NYHA class 1 ([place]) 02/08/2017  . Fibromyalgia and chronic chest pain  02/08/2017  . Atrial fibrillation with RVR ([place]) 02/08/2017  . Acute heart failure ([place]) 02/08/2017  . Rapid atrial fibrillation ([place]) 02/08/2017  . Shortness of breath 02/08/2017  . Moderate episode of recurrent major depressive disorder ([place]) 01/04/2017  . Generalized anxiety disorder 01/04/2017  . Pure hypercholesterolemia 12/28/2016  . 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 Acute blood loss anemia 08/03/2016  . Carotid stenosis 04/02/2015  . Long term current use of anticoagulant therapy 09/26/2014  . PAF (paroxysmal atrial fibrillation) ([place]) 09/19/2014  . 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 Chronic respiratory failure with hypoxia ([place]) 06/27/2014  . 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 Idiopathic scoliosis and kyphoscoliosis 07/19/2013  . Hyperglycemia, drug-induced 10/12/2011  . Encounter for long-term (current) use of medications 08/21/2011  . Thrush 07/24/2011  . PULMONARY NODULE, LEFT LOWER LOBE 10/14/2009  . PATENT FORAMEN OVALE 10/14/2009  . Essential hypertension 06/04/2009  . G E R D 06/04/2009  . SNORING, HX OF 06/04/2009     Past Medical History:  Diagnosis Date  . Cataract    OD  . CHF (congestive heart failure) ([place]) 03/02/2017   EF 25-30% 2018  . Complication of anesthesia    various issues with oxygen  saturations post op  . COPD (chronic obstructive pulmonary disease) ([place])   . Depression   . Diverticulitis   . DVT (deep venous thrombosis) ([place])   . Fibromyalgia   . GERD (gastroesophageal reflux disease)   . Hiatal hernia   . History of deviated nasal septum    left- side  . HTN (hypertension)   . Hyperlipidemia   . Hypertensive retinopathy    OU  . Obesity   . On supplemental oxygen therapy    concentrator at night @ 1.5 l/m or when sleeps. O2 Sat niormally 87.  . Osteoporosis   . PAT (paroxysmal atrial tachycardia) ([place])   . PFO (patent foramen ovale)   . PULMONARY  NODULE, LEFT LOWER LOBE 10/14/2009   62mm LLL nodule dec 2010. Stable and 7mm in Oct 2012. No further fu  . PVD (peripheral vascular disease) with claudication ([place]) 12/2017  . Right middle lobe pneumonia 07/24/2011   First noted at admit 07/10/11. Persists on cxr 07/22/11. Cleared on CT 08/24/11. No further followup  . Stroke ([place])   . TOBACCO ABUSE 06/04/2009     Past Surgical History:  Procedure Laterality Date  . CARDIOVASCULAR STRESS TEST  12/26/2004   EF 74%. NO EVIDENCE OF ISCHEMIA  . CATARACT EXTRACTION Left    Dr. Elliot Dally  . ESOPHAGOGASTRODUODENOSCOPY (EGD) WITH PROPOFOL N/A 04/15/2015   Procedure: ESOPHAGOGASTRODUODENOSCOPY (EGD) WITH PROPOFOL;  Surgeon: Laurence Spates, MD;  Location: [facility];  Service: Endoscopy;  Laterality: N/A;  . EYE SURGERY Left    Cat Sx  . KNEE ARTHROSCOPY  2000   left  . LAPAROSCOPIC CHOLECYSTECTOMY  04-16-2010   [facility]  . LOWER EXTREMITY ANGIOGRAPHY N/A 09/09/2017   Procedure: Lower Extremity Angiography;  Surgeon: Lorretta Harp, MD;  Location: [facility];  Service: Cardiovascular;  Laterality: N/A;  . LOWER EXTREMITY INTERVENTION Left 01/17/2018   Procedure: LOWER EXTREMITY INTERVENTION;  Surgeon: Lorretta Harp, MD;  Location: [facility];  Service: Cardiovascular;  Laterality: Left;  . MOUTH SURGERY     03-26-15 multiple extractions stitches remains  . PERIPHERAL VASCULAR INTERVENTION Left 01/17/2018   Procedure: PERIPHERAL VASCULAR INTERVENTION;  Surgeon: Lorretta Harp, MD;  Location: [facility];  Service: Cardiovascular;  Laterality: Left;  COMMON ILIAC  . RIGHT/LEFT HEART CATH AND CORONARY ANGIOGRAPHY N/A 03/04/2017   Procedure: Right/Left Heart Cath and Coronary Angiography;  Surgeon: Peter M Martinique, MD;  Location: [facility];  Service: Cardiovascular;  Laterality: N/A;  . TOTAL ABDOMINAL HYSTERECTOMY     post op needed oxygen was told "she gave them a scare"  . TUBAL LIGATION    . US ECHOCARDIOGRAPHY   11/20/2009   EF 55-60%     Family History  Problem Relation Age of Onset  . Dementia Mother   . Diabetes Mother   . Alzheimer's disease Mother   . Heart attack Brother 93  . Heart attack Father   . Schizophrenia Sister   . Diabetes Sister   . Tremor Sister      Social History   Socioeconomic History  . Marital status: Divorced    Spouse name: Not on file  . Number of children: 2  . Years of education: Not on file  . Highest education level: Not on file  Occupational History  . Occupation: disability  Tobacco Use  . Smoking status: Former Smoker    Packs/day: 3.00    Years: 30.00    Pack years: 90.00    Types: Cigarettes    Quit date: 06/02/2010    Years since quitting: 9.5  . Smokeless tobacco: Never Used  . Tobacco comment: QUIT IN 2011  Substance and Sexual Activity  . Alcohol use: No    Alcohol/week: 0.0 standard drinks  . Drug use: No  . Sexual activity: Yes    Partners: Male    Birth control/protection: Post-menopausal, Surgical  Other Topics Concern  . Not on file  Social History Narrative   Marital status: divorced in 1978 after ten years; dating casually in 2019.      Children: 3 biological children; 3 court appointed children; 6 grandchildren; 5 gg      Lives: alone; children in [place] and [place].      Employment: retired age 55; disability for COPD, CVA at age 19.      Tobacco: former smoker; quit smoking 2011.      Alcohol: none      Exercise: walks dog four times per day; goes to pool three times per week.      ADLs: drives; no assistant devices; does have a walker.  Cleaning is limited in 2018.  Daughter helps with cleaning.  Does own grocery shopping.      Advanced Directives: YES; DNR/DNI; HCPOA: Rolan Lipa Martin/daughter youngest.  Blind.               Social Determinants of Health   Financial Resource Strain:   . Difficulty of Paying Living Expenses: Not on file  Food Insecurity:   . Worried About Charity fundraiser in the  Last Year: Not on file  . Ran Out of Food in the Last Year: Not on file  Transportation Needs:   . Lack of Transportation (Medical): Not on file  . Lack of Transportation (Non-Medical): Not on file  Physical Activity:   . Days of Exercise per Week: Not on file  . Minutes of Exercise per Session: Not on file  Stress:   .  Feeling of Stress : Not on file  Social Connections:   . Frequency of Communication with Friends and Family: Not on file  . Frequency of Social Gatherings with Friends and Family: Not on file  . Attends Religious Services: Not on file  . Active Member of Clubs or Organizations: Not on file  . Attends Archivist Meetings: Not on file  . Marital Status: Not on file  Intimate Partner Violence:   . Fear of Current or Ex-Partner: Not on file  . Emotionally Abused: Not on file  . Physically Abused: Not on file  . Sexually Abused: Not on file     Allergies  Allergen Reactions  . Alprazolam Anaphylaxis and Other (See Comments)    REACTION: stops breathing  . Bee Venom Anaphylaxis  . Iodine Anaphylaxis, Swelling and Other (See Comments)    REACTION: swelling in throat  . Pseudoephedrine Hcl Er Shortness Of Breath  . Budesonide-Formoterol Fumarate Other (See Comments)    Blisters inside of mouth all over  . Crestor [Rosuvastatin Calcium] Other (See Comments)    Unable to walk  . Esomeprazole Magnesium Other (See Comments)    REACTION: "bouncing off walls"  . Flonase [Fluticasone Propionate] Other (See Comments)    NOSE BLEED  . Lamictal [Lamotrigine] Rash    Patient got rash, labored breathing, and diarrhea  . Loratadine Other (See Comments)    claritin D causes shaking  . Lotrimin [Clotrimazole] Other (See Comments)    Mouth blisters  . Lunesta [Eszopiclone] Other (See Comments)    REACTION: "slept for a week"  . Oxcarbazepine Other (See Comments)    Causes deep sleep and dizziness  . Statins Other (See Comments)    Can't walk, legs won't work   .  Zolpidem Tartrate Other (See Comments)    REACTION: "slept for a week"  . Betadine [Povidone Iodine] Other (See Comments)    Breathing problems  . Bevespi Aerosphere [Glycopyrrolate-Formoterol] Other (See Comments)    Pt believes this caused mouth sores and thrush   . Clarithromycin Other (See Comments)    All "mycins", Puts into "a" fib, Will take if has to for severe sinus infection  . Effexor [Venlafaxine] Nausea And Vomiting and Other (See Comments)    cramps  . Lexapro [Escitalopram Oxalate] Other (See Comments)    hallucinations  . Aciphex [Rabeprazole Sodium] Rash  . Alendronate Sodium Other (See Comments)    "caused stomach problems for 3 days"  . Avelox [Moxifloxacin Hcl In Nacl] Other (See Comments)    Stomach cramps.   Darlin Coco [Valdecoxib] Rash  . Ceclor [Cefaclor] Rash  . Cephalexin Rash and Other (See Comments)    Pt states that she is possibly allergic to this - had a reaction to Cefaclor in the past and she does not want to these class drugs. Added per patient request.  . Covera-Hs [Verapamil Hcl] Palpitations  . Dicyclomine Hcl Rash  . Other Other (See Comments)    Glue from ekg/heart monitor leads --rash, Any MYCINS  . Tessalon Perles Rash     Prior to Admission medications   Medication Sig Start Date End Date Taking? Authorizing Provider  brimonidine (ALPHAGAN) 0.2 % ophthalmic solution Place 1 drop into the right eye 2 (two) times daily. 11/22/19 11/21/20 Yes Bernarda Caffey, MD  Capsaicin (ASPERCREME PAIN RELIEF PATCH) 0.025 % PADS Apply 1 patch topically every other day.   Yes [provider]  carvedilol (COREG) 12.5 MG tablet Take 1 tablet (12.5 mg total)  by mouth 2 (two) times daily. 06/15/19 06/09/20 Yes Lorretta Harp, MD  clopidogrel (PLAVIX) 75 MG tablet Take 1 tablet (75 mg total) by mouth daily. 11/24/19  Yes Martinique, Peter M, MD  desvenlafaxine (PRISTIQ) 50 MG 24 hr tablet Take 1 tablet (50 mg total) by mouth daily. 11/21/19  Yes Arfeen, Arlyce Harman, MD   diazepam (VALIUM) 2 MG tablet Take 1 tablet (2 mg total) by mouth daily as needed for anxiety. 11/21/19  Yes Arfeen, Arlyce Harman, MD  diclofenac sodium (VOLTAREN) 1 % GEL APPLY 2 GRAMS TO BOTH HANDS 4 TIMES A DAY 06/21/18  Yes [provider]  dorzolamide-timolol (COSOPT) 22.3-6.8 MG/ML ophthalmic solution Place 1 drop into the right eye 2 (two) times daily. 11/22/19 11/21/20 Yes Bernarda Caffey, MD  Eyelid Cleansers (OCUSOFT BABY EYELID & EYELASH EX) Apply topically.   Yes [provider]  furosemide (LASIX) 40 MG tablet Take 1 tablet (40 mg total) by mouth daily. 10/02/19  Yes Martinique, Peter M, MD  gabapentin (NEURONTIN) 100 MG capsule Take 200 mg by mouth See admin instructions. 200 mg at noon and  200 mg at bedtime 09/16/16  Yes [provider]  HYDROcodone-acetaminophen (NORCO/VICODIN) 5-325 MG tablet Take 1 tablet by mouth 3 (three) times daily.    Yes [provider]  ipratropium (ATROVENT) 0.02 % nebulizer solution USE 1 VIAL BY NEBULIZATION 4 (FOUR) TIMES DAILY. 10/17/19  Yes Brand Males, MD  montelukast (SINGULAIR) 10 MG tablet TAKE 1 TABLET BY MOUTH EVERYDAY AT BEDTIME 11/09/19  Yes Rutherford Guys, MD  ondansetron (ZOFRAN-ODT) 8 MG disintegrating tablet TAKE 1 TABLET BY MOUTH EVERY 8 HOURS AS NEEDED NAUSEA OR VOMITING 12/04/19  Yes Rutherford Guys, MD  oxybutynin (DITROPAN-XL) 10 MG 24 hr tablet TAKE 1 TABLET BY MOUTH EVERY DAY Patient taking differently: Take 10 mg by mouth daily.  11/12/19  Yes Rutherford Guys, MD  OXYGEN Inhale 1.5-2 L into the lungs as needed (for shortness of breath).   Yes [provider]  pantoprazole (PROTONIX) 40 MG tablet Take 1 tablet (40 mg total) by mouth daily. 11/07/19  Yes Rutherford Guys, MD  polyethylene glycol [facility] Floria Raveling) packet Take 17 g by mouth every other day.    Yes [provider]  rosuvastatin (CRESTOR) 5 MG tablet TAKE 1 TABLET BY MOUTH 2 TIMES A WEEK. SUNDAY AND WEDNESDAY 08/15/19  Yes  Lorretta Harp, MD  spironolactone (ALDACTONE) 25 MG tablet TAKE 1 TABLET BY MOUTH EVERY DAY 03/13/19  Yes Bhagat, Bhavinkumar, PA  tiZANidine (ZANAFLEX) 2 MG tablet Take 2 mg by mouth at bedtime.    Yes [provider]  traZODone (DESYREL) 100 MG tablet Take one tab daily as needed for sleep 11/21/19  Yes Arfeen, Arlyce Harman, MD  Vitamin D, Ergocalciferol, (DRISDOL) 1.25 MG (50000 UNIT) CAPS capsule Take 1 capsule (50,000 Units total) by mouth every 7 (seven) days. 11/17/19  Yes Rutherford Guys, MD  warfarin (COUMADIN) 5 MG tablet TAKE 1/2 TO 1 TABLET BY MOUTH EVERY DAY OR AS DIRECTED BY COUMADIN CLINIC Patient taking differently: Take 2.5-5 mg by mouth See admin instructions. Take 2.5 mg on Friday, take 5 mg on all other days. 10/20/19  Yes Martinique, Peter M, MD     Depression screen [facility] 2/9 12/14/2019 11/07/2019 11/28/2018 04/13/2018 04/13/2018  Decreased Interest 0 0 0 1 0  Down, Depressed, Hopeless 0 0 0 1 0  PHQ - 2 Score 0 0 0 2 0  Altered sleeping - - -  1 -  Tired, decreased energy - - - 1 -  Change in appetite - - - 0 -  Feeling bad or failure about yourself  - - - 1 -  Trouble concentrating - - - 0 -  Moving slowly or fidgety/restless - - - 0 -  Suicidal thoughts - - - 0 -  PHQ-9 Score - - - 5 -  Some recent data might be hidden     Fall Risk  12/14/2019 11/07/2019 05/22/2019 11/28/2018 04/13/2018  Falls in the past year? 1 0 0 0 No  Number falls in past yr: 0 0 0 - -  Comment fell twice over dog - - - -  Injury with Fall? 0 0 0 - -  Comment - - - - -  Follow up Falls evaluation completed;Education provided - - - -      PHYSICAL EXAM: BP 95/67 Comment: taken from a previous visit  Ht 5\' 4"  (1.626 m)   Wt 190 lb (86.2 kg) Comment: per patient  BMI 32.61 kg/m    Wt Readings from Last 3 Encounters:  12/14/19 190 lb (86.2 kg)  11/08/19 187 lb (84.8 kg)  07/31/19 194 lb 9.6 oz (88.3 kg)   Medicare annual wellness visit, subsequent    Education/Counseling provided  regarding diet and exercise, prevention of chronic diseases, smoking/tobacco cessation, if applicable, and reviewed "Covered Medicare Preventive Services."

## 2019-12-14 NOTE — Patient Instructions (Addendum)
Thank you for taking time to come for your Medicare Wellness Visit. I appreciate your ongoing commitment to your health goals. Please review the following plan we discussed and let me know if I can assist you in the future.    LPN  Preventive Care 65 Years and Older, Female Preventive care refers to lifestyle choices and visits with your health care provider that can promote health and wellness. This includes:  A yearly physical exam. This is also called an annual well check.  Regular dental and eye exams.  Immunizations.  Screening for certain conditions.  Healthy lifestyle choices, such as diet and exercise. What can I expect for my preventive care visit? Physical exam Your health care provider will check:  Height and weight. These may be used to calculate body mass index (BMI), which is a measurement that tells if you are at a healthy weight.  Heart rate and blood pressure.  Your skin for abnormal spots. Counseling Your health care provider may ask you questions about:  Alcohol, tobacco, and drug use.  Emotional well-being.  Home and relationship well-being.  Sexual activity.  Eating habits.  History of falls.  Memory and ability to understand (cognition).  Work and work environment.  Pregnancy and menstrual history. What immunizations do I need?  Influenza (flu) vaccine  This is recommended every year. Tetanus, diphtheria, and pertussis (Tdap) vaccine  You may need a Td booster every 10 years. Varicella (chickenpox) vaccine  You may need this vaccine if you have not already been vaccinated. Zoster (shingles) vaccine  You may need this after age 60. Pneumococcal conjugate (PCV13) vaccine  One dose is recommended after age 65. Pneumococcal polysaccharide (PPSV23) vaccine  One dose is recommended after age 65. Measles, mumps, and rubella (MMR) vaccine  You may need at least one dose of MMR if you were born in 1957 or later. You may also  need a second dose. Meningococcal conjugate (MenACWY) vaccine  You may need this if you have certain conditions. Hepatitis A vaccine  You may need this if you have certain conditions or if you travel or work in places where you may be exposed to hepatitis A. Hepatitis B vaccine  You may need this if you have certain conditions or if you travel or work in places where you may be exposed to hepatitis B. Haemophilus influenzae type b (Hib) vaccine  You may need this if you have certain conditions. You may receive vaccines as individual doses or as more than one vaccine together in one shot (combination vaccines). Talk with your health care provider about the risks and benefits of combination vaccines. What tests do I need? Blood tests  Lipid and cholesterol levels. These may be checked every 5 years, or more frequently depending on your overall health.  Hepatitis C test.  Hepatitis B test. Screening  Lung cancer screening. You may have this screening every year starting at age 55 if you have a 30-pack-year history of smoking and currently smoke or have quit within the past 15 years.  Colorectal cancer screening. All adults should have this screening starting at age 50 and continuing until age 75. Your health care provider may recommend screening at age 45 if you are at increased risk. You will have tests every 1-10 years, depending on your results and the type of screening test.  Diabetes screening. This is done by checking your blood sugar (glucose) after you have not eaten for a while (fasting). You may have this done every 1-3   years.  Mammogram. This may be done every 1-2 years. Talk with your health care provider about how often you should have regular mammograms.  BRCA-related cancer screening. This may be done if you have a family history of breast, ovarian, tubal, or peritoneal cancers. Other tests  Sexually transmitted disease (STD) testing.  Bone density scan. This is done  to screen for osteoporosis. You may have this done starting at age 94. Follow these instructions at home: Eating and drinking  Eat a diet that includes fresh fruits and vegetables, whole grains, lean protein, and low-fat dairy products. Limit your intake of foods with high amounts of sugar, saturated fats, and salt.  Take vitamin and mineral supplements as recommended by your health care provider.  Do not drink alcohol if your health care provider tells you not to drink.  If you drink alcohol: ? Limit how much you have to 0-1 drink a day. ? Be aware of how much alcohol is in your drink. In the [place] one drink equals one 12 oz bottle of beer (355 mL), one 5 oz glass of wine (148 mL), or one 1 oz glass of hard liquor (44 mL). Lifestyle  Take daily care of your teeth and gums.  Stay active. Exercise for at least 30 minutes on 5 or more days each week.  Do not use any products that contain nicotine or tobacco, such as cigarettes, e-cigarettes, and chewing tobacco. If you need help quitting, ask your health care provider.  If you are sexually active, practice safe sex. Use a condom or other form of protection in order to prevent STIs (sexually transmitted infections).  Talk with your health care provider about taking a low-dose aspirin or statin. What's next?  Go to your health care provider once a year for a well check visit.  Ask your health care provider how often you should have your eyes and teeth checked.  Stay up to date on all vaccines. This information is not intended to replace advice given to you by your health care provider. Make sure you discuss any questions you have with your health care provider. Document Revised: 10/13/2018 Document Reviewed: 10/13/2018 Elsevier Patient Education  2020 [employer].

## 2019-12-21 ENCOUNTER — Ambulatory Visit: Payer: Medicare Other | Admitting: Cardiovascular Disease

## 2019-12-25 ENCOUNTER — Other Ambulatory Visit: Payer: Self-pay

## 2019-12-25 ENCOUNTER — Ambulatory Visit (INDEPENDENT_AMBULATORY_CARE_PROVIDER_SITE_OTHER): Payer: Medicare Other | Admitting: Neurology

## 2019-12-25 ENCOUNTER — Encounter: Payer: Self-pay | Admitting: Neurology

## 2019-12-25 VITALS — BP 138/90 | HR 90 | Temp 96.9°F | Ht 64.0 in | Wt 193.0 lb

## 2019-12-25 DIAGNOSIS — R413 Other amnesia: Secondary | ICD-10-CM | POA: Diagnosis not present

## 2019-12-25 DIAGNOSIS — H3411 Central retinal artery occlusion, right eye: Secondary | ICD-10-CM

## 2019-12-25 DIAGNOSIS — G3184 Mild cognitive impairment, so stated: Secondary | ICD-10-CM

## 2019-12-25 NOTE — Progress Notes (Signed)
[facility] [address] [place]. [place] [address] [phone]       OFFICE CONSULT NOTE  [name] Date of Birth:  Jul 02, 1946 Medical Record Number:  [id number]   Referring MD: Gerlean Ren  Reason for Referral: Right eye vision loss and memory loss HPI: Laurie Horton is a 74 year old pleasant Caucasian lady seen today for initial office consultation visit.  History is obtained from the patient, review of electronic medical records and I personally reviewed imaging films in PACS.  Patient states she woke up on 11/21/2019 with sudden onset of painless right eye vision loss.  She had no problems with vision in the left eye.  She denied any accompanying headache or burning discomfort in the eye.  She was seen by ophthalmologist Dr. Parke Simmers who referred her to Dr. Coralyn Pear who diagnosed central retinal artery occlusion.  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  MRI of the brain showed no large vessel stenosis and only mild atheromatous irregularities and moderate stenosis of left distal vertebral artery.  MRA of the neck showed severe stenosis of left subclavian artery and 50% stenosis of right internal carotid artery.  Patient has history of chronic A. fib and is on anticoagulation with warfarin.  She had a carotid ultrasound on 08/07/2019 which showed 40 to 50% left ICA and 1 to 39% right ICA stenosis.  LDL cholesterol on 11/13/2019 was 99 mg percent and hemoglobin A1c on 03/14/2019 was 6.3.  Patient states right eye vision has not improved.  She has had no further stroke or TIA symptoms.  She however states she has noticed memory difficulties which are ongoing for about a year or so.  These are mostly involving short-term memory.  She has 2 take notes otherwise she forgets.  She  is still independent in managing most of her affairs however she is finding of memory difficulties and knowing.  She has no family stable dementia.  She has no prior history of significant head injury with loss of consciousness, seizures or prior known strokes or neurological problems.  ROS:   14 system review of systems is positive for vision loss, memory loss, cognitive difficulties, bruising and all other systems negative  PMH:  Past Medical History:  Diagnosis Date  . Cataract    OD  . CHF (congestive heart failure) ([place]) 03/02/2017   EF 25-30% 2018  . Complication of anesthesia    various issues with oxygen  saturations post op  . COPD (chronic obstructive pulmonary disease) ([place])   . Depression   . Diverticulitis   . DVT (deep venous thrombosis) ([name])   . Fibromyalgia   . GERD (gastroesophageal reflux disease)   . Hiatal hernia   . History of deviated nasal septum    left- side  . HTN (hypertension)   . Hyperlipidemia   . Hypertensive retinopathy    OU  . Obesity   . On supplemental oxygen therapy    concentrator at night @ 1.5 l/m or when sleeps. O2 Sat niormally 87.  . Osteoporosis   . PAT (paroxysmal atrial tachycardia) ([place])   . PFO (patent foramen ovale)   . PULMONARY NODULE, LEFT LOWER LOBE 10/14/2009   45mm LLL nodule dec 2010. Stable and 24mm in Oct 2012. No further fu  . PVD (peripheral vascular disease) with  claudication ([place]) 12/2017  . Right middle lobe pneumonia 07/24/2011   First noted at admit 07/10/11. Persists on cxr 07/22/11. Cleared on CT 08/24/11. No further followup  . Stroke ([place])   . TOBACCO ABUSE 06/04/2009    Social History:  Social History   Socioeconomic History  . Marital status: Divorced    Spouse name: Not on file  . Number of children: 2  . Years of education: Not on file  . Highest education level: Not on file  Occupational History  . Occupation: disability  Tobacco Use  . Smoking status: Former Smoker    Packs/day: 3.00    Years:  30.00    Pack years: 90.00    Types: Cigarettes    Quit date: 06/02/2010    Years since quitting: 9.5  . Smokeless tobacco: Never Used  . Tobacco comment: QUIT IN 2011  Substance and Sexual Activity  . Alcohol use: No    Alcohol/week: 0.0 standard drinks  . Drug use: No  . Sexual activity: Yes    Partners: Male    Birth control/protection: Post-menopausal, Surgical  Other Topics Concern  . Not on file  Social History Narrative   Marital status: divorced in 1978 after ten years; dating casually in 2019.      Children: 3 biological children; 3 court appointed children; 6 grandchildren; 5 gg      Lives: alone; children in [place] and [place].      Employment: retired age 74; disability for COPD, CVA at age 74.      Tobacco: former smoker; quit smoking 2011.      Alcohol: none      Exercise: walks dog four times per day; goes to pool three times per week.      ADLs: drives; no assistant devices; does have a walker.  Cleaning is limited in 2018.  Daughter helps with cleaning.  Does own grocery shopping.      Advanced Directives: YES; DNR/DNI; HCPOA: Rolan Lipa Martin/daughter youngest.  Blind.               Social Determinants of Health   Financial Resource Strain:   . Difficulty of Paying Living Expenses: Not on file  Food Insecurity:   . Worried About Charity fundraiser in the Last Year: Not on file  . Ran Out of Food in the Last Year: Not on file  Transportation Needs:   . Lack of Transportation (Medical): Not on file  . Lack of Transportation (Non-Medical): Not on file  Physical Activity:   . Days of Exercise per Week: Not on file  . Minutes of Exercise per Session: Not on file  Stress:   . Feeling of Stress : Not on file  Social Connections:   . Frequency of Communication with Friends and Family: Not on file  . Frequency of Social Gatherings with Friends and Family: Not on file  . Attends Religious Services: Not on file  . Active Member of Clubs or  Organizations: Not on file  . Attends Archivist Meetings: Not on file  . Marital Status: Not on file  Intimate Partner Violence:   . Fear of Current or Ex-Partner: Not on file  . Emotionally Abused: Not on file  . Physically Abused: Not on file  . Sexually Abused: Not on file    Medications:   Current Outpatient Medications on File Prior to Visit  Medication Sig Dispense Refill  . brimonidine (ALPHAGAN) 0.2 % ophthalmic solution Place 1 drop into  the right eye 2 (two) times daily. 15 mL 6  . Capsaicin (ASPERCREME PAIN RELIEF PATCH) 0.025 % PADS Apply 1 patch topically every other day.    . carvedilol (COREG) 12.5 MG tablet Take 1 tablet (12.5 mg total) by mouth 2 (two) times daily. 60 tablet 11  . clopidogrel (PLAVIX) 75 MG tablet Take 1 tablet (75 mg total) by mouth daily. 30 tablet 5  . desvenlafaxine (PRISTIQ) 50 MG 24 hr tablet Take 1 tablet (50 mg total) by mouth daily. 30 tablet 2  . diazepam (VALIUM) 2 MG tablet Take 1 tablet (2 mg total) by mouth daily as needed for anxiety. 10 tablet 0  . diclofenac sodium (VOLTAREN) 1 % GEL APPLY 2 GRAMS TO BOTH HANDS 4 TIMES A DAY  5  . dorzolamide-timolol (COSOPT) 22.3-6.8 MG/ML ophthalmic solution Place 1 drop into the right eye 2 (two) times daily. 10 mL 6  . Eyelid Cleansers (OCUSOFT BABY EYELID & EYELASH EX) Apply topically.    . furosemide (LASIX) 40 MG tablet Take 1 tablet (40 mg total) by mouth daily. 90 tablet 3  . gabapentin (NEURONTIN) 100 MG capsule Take 200 mg by mouth See admin instructions. Take 1 in am and 2 in the lunch and 1 at bedtime    . HYDROcodone-acetaminophen (NORCO/VICODIN) 5-325 MG tablet Take 1 tablet by mouth 3 (three) times daily.     Marland Kitchen ipratropium (ATROVENT) 0.02 % nebulizer solution USE 1 VIAL BY NEBULIZATION 4 (FOUR) TIMES DAILY. 75 mL 3  . montelukast (SINGULAIR) 10 MG tablet TAKE 1 TABLET BY MOUTH EVERYDAY AT BEDTIME 30 tablet 5  . ondansetron (ZOFRAN-ODT) 8 MG disintegrating tablet TAKE 1 TABLET BY  MOUTH EVERY 8 HOURS AS NEEDED NAUSEA OR VOMITING 20 tablet 0  . oxybutynin (DITROPAN-XL) 10 MG 24 hr tablet TAKE 1 TABLET BY MOUTH EVERY DAY (Patient taking differently: Take 10 mg by mouth daily. ) 90 tablet 1  . OXYGEN Inhale 1.5-2 L into the lungs as needed (for shortness of breath).    . pantoprazole (PROTONIX) 40 MG tablet Take 1 tablet (40 mg total) by mouth daily. 90 tablet 1  . polyethylene glycol (MIRALAX / GLYCOLAX) packet Take 17 g by mouth every other day.     . rosuvastatin (CRESTOR) 5 MG tablet TAKE 1 TABLET BY MOUTH 2 TIMES A WEEK. SUNDAY AND WEDNESDAY 8 tablet 8  . spironolactone (ALDACTONE) 25 MG tablet TAKE 1 TABLET BY MOUTH EVERY DAY 30 tablet 10  . tiZANidine (ZANAFLEX) 2 MG tablet Take 2 mg by mouth at bedtime. Take 1/2 in am , 1/2 at lunch and 1 at night    . traZODone (DESYREL) 100 MG tablet Take one tab daily as needed for sleep 30 tablet 2  . Vitamin D, Ergocalciferol, (DRISDOL) 1.25 MG (50000 UNIT) CAPS capsule Take 1 capsule (50,000 Units total) by mouth every 7 (seven) days. 16 capsule 0  . warfarin (COUMADIN) 5 MG tablet TAKE 1/2 TO 1 TABLET BY MOUTH EVERY DAY OR AS DIRECTED BY COUMADIN CLINIC (Patient taking differently: Take 2.5-5 mg by mouth See admin instructions. Take 2.5 mg on Friday, take 5 mg on all other days.) 30 tablet 5   No current facility-administered medications on file prior to visit.    Allergies:   Allergies  Allergen Reactions  . Alprazolam Anaphylaxis and Other (See Comments)    REACTION: stops breathing  . Bee Venom Anaphylaxis  . Iodine Anaphylaxis, Swelling and Other (See Comments)    REACTION: swelling in  throat  . Pseudoephedrine Hcl Er Shortness Of Breath  . Budesonide-Formoterol Fumarate Other (See Comments)    Blisters inside of mouth all over  . Crestor [Rosuvastatin Calcium] Other (See Comments)    Unable to walk  . Esomeprazole Magnesium Other (See Comments)    REACTION: "bouncing off walls"  . Flonase [Fluticasone Propionate]  Other (See Comments)    NOSE BLEED  . Lamictal [Lamotrigine] Rash    Patient got rash, labored breathing, and diarrhea  . Loratadine Other (See Comments)    claritin D causes shaking  . Lotrimin [Clotrimazole] Other (See Comments)    Mouth blisters  . Lunesta [Eszopiclone] Other (See Comments)    REACTION: "slept for a week"  . Oxcarbazepine Other (See Comments)    Causes deep sleep and dizziness  . Statins Other (See Comments)    Can't walk, legs won't work   . Zolpidem Tartrate Other (See Comments)    REACTION: "slept for a week"  . Betadine [Povidone Iodine] Other (See Comments)    Breathing problems  . Bevespi Aerosphere [Glycopyrrolate-Formoterol] Other (See Comments)    Pt believes this caused mouth sores and thrush   . Clarithromycin Other (See Comments)    All "mycins", Puts into "a" fib, Will take if has to for severe sinus infection  . Effexor [Venlafaxine] Nausea And Vomiting and Other (See Comments)    cramps  . Lexapro [Escitalopram Oxalate] Other (See Comments)    hallucinations  . Aciphex [Rabeprazole Sodium] Rash  . Alendronate Sodium Other (See Comments)    "caused stomach problems for 3 days"  . Avelox [Moxifloxacin Hcl In Nacl] Other (See Comments)    Stomach cramps.   Darlin Coco [Valdecoxib] Rash  . Ceclor [Cefaclor] Rash  . Cephalexin Rash and Other (See Comments)    Pt states that she is possibly allergic to this - had a reaction to Cefaclor in the past and she does not want to these class drugs. Added per patient request.  . Covera-Hs [Verapamil Hcl] Palpitations  . Dicyclomine Hcl Rash  . Other Other (See Comments)    Glue from ekg/heart monitor leads --rash, Any MYCINS  . Tessalon Perles Rash    Physical Exam General: well developed, well nourished, seated, in no evident distress Head: head normocephalic and atraumatic.   Neck: supple with soft right carotid bruit.   Cardiovascular: regular rate and rhythm, no murmurs Musculoskeletal: no deformity  Skin:  no rash/petichiae Vascular:  Normal pulses all extremities  Neurologic Exam Mental Status: Awake and fully alert. Oriented to place and time. Recent and remote memory intact. Attention span, concentration and fund of knowledge appropriate. Mood and affect appropriate.  Diminished recall 2/3.  Able to name 12 animals which can walk on 4 legs.  Clock drawing 3/4.  Calculation and naming intact Cranial Nerves: Fundoscopic exam difficult to do through undilated right pupil is 3 mm sluggishly reactive left pupil is 2 mm briskly reactive.. Extraocular movements full without nystagmus but slight exotropia of right eye.. Visual fields full to confrontation in left eye.  Right eye had no light perception and she is legally blind.  In the right eye.  Hearing intact. Facial sensation intact. Face, tongue, palate moves normally and symmetrically.  Motor: Normal bulk and tone. Normal strength in all tested extremity muscles. Sensory.:  Slight diminished touch pinprick sensation left hemibody.  Position vibration intact. Coordination: Rapid alternating movements normal in all extremities. Finger-to-nose and heel-to-shin performed accurately bilaterally. Gait and Station: Arises from chair without  difficulty. Stance is normal. Gait demonstrates normal stride length and balance . Able to heel, toe and tandem walk without difficulty.  Reflexes: 1+ and symmetric. Toes downgoing.   NIHSS  0Modified Rankin  2  ASSESSMENT: 74 year old lady with sudden onset of painless right eye vision loss in January 2021 secondary to central retinal artery occlusion.  Also mild cognitive and memory difficulties due to age-appropriate mild cognitive impairment.  Vascular risk factors of atrial fibrillation, hypertension, hyperlipidemia, peripheral vascular disease and cerebrovascular disease     PLAN: I had a long discussion the patient regarding her recent episode of right eye vision loss due to central retinal artery  occlusion as well as memory and cognitive difficulties due to mild cognitive impairment and answered questions.  Reviewed results of recent MRI and MR angiograms.  I recommend further evaluation by checking memory panel labs and EEG.  I advised her to continue warfarin for stroke prevention given history of atrial fibrillation and maintain aggressive risk factor modification with strict control of hypertension with blood pressure goal below 130/90, lipids with LDL cholesterol goal below 70 mg percent and diabetes with hemoglobin A1c goal below 6.5%.  She is also encouraged to eat a healthy diet and to be active and lose weight.  I also encouraged her to increase participation in cognitively challenging activities like solving crossword puzzles, playing bridge and sodoku.  We also discussed memory compensation strategies.  Greater than 50% time during this 60-minute consultation visit were spent on counseling and coordination of care about retinal artery occlusion and mild cognitive impairment and answering questions she will return for follow-up in the future in 3 months or call earlier if necessary. Antony Contras, MD  [facility] [address] [place], [place] [address]  Phone [phone] Fax [phone] Note: This document was prepared with digital dictation and possible smart phrase technology. Any transcriptional errors that result from this process are unintentional.

## 2019-12-25 NOTE — Patient Instructions (Signed)
I had a long discussion the patient regarding her recent episode of right eye vision loss due to central retinal artery occlusion as well as memory and cognitive difficulties due to mild cognitive impairment and answered questions.  Reviewed results of recent MRI and MR angiograms.  I recommend further evaluation by checking memory panel labs and EEG.  I advised her to continue warfarin for stroke prevention given history of atrial fibrillation and maintain aggressive risk factor modification with strict control of hypertension with blood pressure goal below 130/90, lipids with LDL cholesterol goal below 70 mg percent and diabetes with hemoglobin A1c goal below 6.5%.  She is also encouraged to eat a healthy diet and to be active and lose weight.  I also encouraged her to increase participation in cognitively challenging activities like solving crossword puzzles, playing bridge and sodoku.  We also discussed memory compensation strategies.  She will return for follow-up in the future in 3 months or call earlier if necessary. Memory Compensation Strategies  1. Use "WARM" strategy.  W= write it down  A= associate it  R= repeat it  M= make a mental note  2.   You can keep a Social worker.  Use a 3-ring notebook with sections for the following: calendar, important names and phone numbers,  medications, doctors' names/phone numbers, lists/reminders, and a section to journal what you did  each day.   3.    Use a calendar to write appointments down.  4.    Write yourself a schedule for the day.  This can be placed on the calendar or in a separate section of the Memory Notebook.  Keeping a  regular schedule can help memory.  5.    Use medication organizer with sections for each day or morning/evening pills.  You may need help loading it  6.    Keep a basket, or pegboard by the door.  Place items that you need to take out with you in the basket or on the pegboard.  You may also want to  include a  message board for reminders.  7.    Use sticky notes.  Place sticky notes with reminders in a place where the task is performed.  For example: " turn off the  stove" placed by the stove, "lock the door" placed on the door at eye level, " take your medications" on  the bathroom mirror or by the place where you normally take your medications.  8.    Use alarms/timers.  Use while cooking to remind yourself to check on food or as a reminder to take your medicine, or as a  reminder to make a call, or as a reminder to perform another task, etc.

## 2019-12-26 LAB — DEMENTIA PANEL
Homocysteine: 16.8 umol/L (ref 0.0–19.2)
RPR Ser Ql: NONREACTIVE
TSH: 3.76 u[IU]/mL (ref 0.450–4.500)
Vitamin B-12: 368 pg/mL (ref 232–1245)

## 2019-12-27 ENCOUNTER — Other Ambulatory Visit: Payer: Self-pay

## 2019-12-27 ENCOUNTER — Ambulatory Visit (INDEPENDENT_AMBULATORY_CARE_PROVIDER_SITE_OTHER): Payer: Medicare Other | Admitting: Pharmacist Clinician (PhC)/ Clinical Pharmacy Specialist

## 2019-12-27 DIAGNOSIS — Z7901 Long term (current) use of anticoagulants: Secondary | ICD-10-CM

## 2019-12-27 DIAGNOSIS — I480 Paroxysmal atrial fibrillation: Secondary | ICD-10-CM

## 2019-12-27 LAB — POCT INR: INR: 2.7 (ref 2.0–3.0)

## 2019-12-28 ENCOUNTER — Telehealth (INDEPENDENT_AMBULATORY_CARE_PROVIDER_SITE_OTHER): Payer: Self-pay

## 2020-01-02 NOTE — Progress Notes (Signed)
Kindly inform patient that lab work for reversible causes of memory loss was all normal

## 2020-01-03 ENCOUNTER — Other Ambulatory Visit: Payer: Self-pay

## 2020-01-03 ENCOUNTER — Ambulatory Visit (INDEPENDENT_AMBULATORY_CARE_PROVIDER_SITE_OTHER): Payer: Medicare Other | Admitting: Neurology

## 2020-01-03 ENCOUNTER — Telehealth: Payer: Self-pay | Admitting: *Deleted

## 2020-01-03 DIAGNOSIS — R413 Other amnesia: Secondary | ICD-10-CM

## 2020-01-03 DIAGNOSIS — R410 Disorientation, unspecified: Secondary | ICD-10-CM | POA: Diagnosis not present

## 2020-01-03 DIAGNOSIS — G3184 Mild cognitive impairment, so stated: Secondary | ICD-10-CM

## 2020-01-03 NOTE — Telephone Encounter (Signed)
-----   Message from Garvin Fila, MD sent at 01/02/2020  6:01 PM EST ----- Kindly inform patient that lab work for reversible causes of memory loss was all normal

## 2020-01-03 NOTE — Telephone Encounter (Signed)
I spoke with the patient and discussed lab results checking for reversible causes of memory loss were all normal. Her questions were answered. She had her EEG today. Pt aware it will likely be next week when she receives the results. She verbalized appreciation for the call.

## 2020-01-17 ENCOUNTER — Encounter: Payer: Self-pay | Admitting: Internal Medicine

## 2020-01-17 ENCOUNTER — Ambulatory Visit (INDEPENDENT_AMBULATORY_CARE_PROVIDER_SITE_OTHER): Payer: Medicare Other | Admitting: Internal Medicine

## 2020-01-17 ENCOUNTER — Other Ambulatory Visit: Payer: Self-pay

## 2020-01-17 VITALS — BP 150/80 | HR 69 | Temp 97.6°F | Ht 63.0 in | Wt 190.6 lb

## 2020-01-17 DIAGNOSIS — J441 Chronic obstructive pulmonary disease with (acute) exacerbation: Secondary | ICD-10-CM | POA: Diagnosis not present

## 2020-01-17 DIAGNOSIS — Z7185 Encounter for immunization safety counseling: Secondary | ICD-10-CM

## 2020-01-17 DIAGNOSIS — J449 Chronic obstructive pulmonary disease, unspecified: Secondary | ICD-10-CM | POA: Diagnosis not present

## 2020-01-17 DIAGNOSIS — Z7189 Other specified counseling: Secondary | ICD-10-CM

## 2020-01-17 DIAGNOSIS — Z889 Allergy status to unspecified drugs, medicaments and biological substances status: Secondary | ICD-10-CM | POA: Diagnosis not present

## 2020-01-17 MED ORDER — PREDNISONE 10 MG PO TABS: ORAL_TABLET | ORAL | 0 refills | Status: DC

## 2020-01-17 MED ORDER — CLARITHROMYCIN 500 MG PO TABS: 500.0000 mg | ORAL_TABLET | Freq: Two times a day (BID) | ORAL | 0 refills | Status: DC

## 2020-01-17 NOTE — Addendum Note (Signed)
Addended by: Jannette Spanner on: 01/17/2020 12:43 PM   Modules accepted: Orders

## 2020-01-17 NOTE — Addendum Note (Signed)
Addended by: Jannette Spanner on: 01/17/2020 12:47 PM   Modules accepted: Orders

## 2020-01-17 NOTE — Patient Instructions (Addendum)
COPD, severe ([place]) COPD with acute exacerbation ([place])   There might be an ongoing mild flareup  Plan -Please take prednisone 40 mg x1 day, then 30 mg x1 day, then 20 mg x1 day, then 10 mg x1 day, and then 5 mg x1 day and stop -Do Biaxin [no generic but do the treatment] -500 mg twice daily x5 days [per your history you are not allergic to this particular brand] -Continue baseline nebulizers and the night 1 L oxygen   Multiple drug allergies Vaccine counseling  -There is increased risk for allergic reactions after Covid vaccine given your prior history of multiple drug allergies including injectable flu shot.  Overall risk is low but still there is an 8 fold increased risk.  Plan -Based on this it is your choice for that except the risk and take a Covid vaccine.  If you do it then you will need an extended observation time.  -Given the fact you are to protect yourself against multiple respiratory pathogens even beyond Covid in the setting of COPD I suggest masking and distancing is a good overall strategy  Plan -78-month office visit

## 2020-01-17 NOTE — Progress Notes (Addendum)
OV 05/19/2016  Chief Complaint  Patient presents with  . Follow-up    Pt c/o worsening SOB, prod cough with thick white mucus.      This is a routine follow-up. Last visit was in April 2017 with nurse practitioner. At that time treated for COPD exacerbation according to her history but review of the chart does not show that to be true. At this point in time she says COPD stable although she says she might be in flare up on account of her fibromyalgia. Starting her symptoms out it appears that it is generally stable with dyspnea at baseline and cough with mild sputum at baseline. She is more hobbled by her chronic pain and fibromyalgia. Couple months ago apparently a hydrocodone was discontinued by rheumatology and therefore she is having "withdrawal". She does have a new pain medication physician. She is on gabapentin for fibromyalgia. There are no other new issues. She does not want antibiotics or prednisone for her perceived exacerbation  OV 11/17/2016  Chief Complaint  Patient presents with  . Follow-up    Pt states her SOB has worsened since last OV. Pt states she has a burning in her chest when she becomes SOB. Pt c/o prod cough with white mucus in morning, cough becomes nonprod throughout the day.     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 Last CT chest March 2017 without any mass and associated mild cor pulmonale   Six-month follow-up visit. She is completely overwhelmed by her fibromyalgia and depression. The fibromyalgia is worse. She says she is change doctors because of this. She's had a few admissions in the interim but none of them give a COPD exacerbation according to chart review. She suffered frustrated by her heels oxygen system even though it is light it is causing her pain. She uses Atrovent nebulizer but wants change to something else but at the same time has rejected use of any other  nebulizer or oral inhaler because of side effects. She is burning chest pain with inspiration and associated with her costochondral junction trigger points. 2014 review of the chart shows normal cardiac stress test. November 2017 chest x-ray is clear. She does not want any further imaging. Chest pain is mild to severe and variable. Worsened with inspiration. No radiation associated wheezing. No sputum production  OV 05/25/2017  Chief Complaint  Patient presents with  . Follow-up    Pt states her SOB has worsened since last OV in 11/2016. Pt states she only coughs after her neb treatment - pt states her mucus is yellow in color and c/o occ chest discomfort. Pt denies f/c/s.     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 Last CT chest March 2017 without any mass and associated mild cor pulmonale   Follow-up exertional hypoxemia associated with his emphysema. Also associated with fibromyalgia. Last visit she had atypical chest pain. Recommended she see cardiology. 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 According to her history she has significant coronary artery disease but is fairly advanced. She is frustrated with this but realistic. She feels her days are number. In terms of COPD emphysema and is stable. She is on Atrovent inhalers. She is on oxygen. She wants a lighter system. She still burden by fibromyalgia. She does not want to do  any vaccines anymore including flu shot and the new  shingles vaccine   OV 11/22/2017  Chief Complaint  Patient presents with  . 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  She uses oxygen and nebulizers.  She is not interested in rehab or vaccines.  New issue: Preoperative pulmonary evaluation.  She is having significant bilateral lower extremity claudication.  She states that she is in severe pain walking from her door to the mailbox to the point she is almost crying.  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  Apparently Dr. Trula Slade wants to try a laparotomy approach.  However Dr. Broadus John might take her to the cardiac Cath Lab and placed stents.  She says she is sensitive to fentanyl and is worried about anesthesia complications but the pain is so severe she is willing to take the risk.  She has had previous cardiac catheterization without any problems other than being sensitive to fentanyl.  She says this can be done in the cardiac Cath Lab with anesthesia support.  She wants me to talk to Dr. Betsy Coder and Dr. Trula Slade about this.   OV 07/18/2018  Subjective:  Patient ID: Laurie Horton, female , DOB: Mar 02, 1946 , age 4 y.o. , MRN: [id number] , ADDRESS: [address] [place] [address]   07/18/2018 -   Chief Complaint  Patient presents with  . Follow-up    Pt states her chest is hurting her all the time now and states she does not think her neb solutions are working for her anymore now. Pt also states she has had some worsening SOB and is also coughing up white phlegm which is comes out in chunks.     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  She uses oxygen and nebulizers. Last CT chest March 2017 without any mass and associated mild cor pulmonale   Laurie Laurie Horton 74 y.o. -after last visit she  saw nurse practitioner in June 2019 fora mild respiratory flare. At the time treated with allergy medications antihistamines.She tells me thathe last saw me January 2019 she had an iliac stent place in the left side and after that her effort tolerance is improved but in the last few months she's noticed a decrease in effort tolerance with worsening dyspnea and also increased cough and increased sputum production in volume and also consistency without change in color This no fever or weight loss. She will not have a flu shot because of prior allergy. Her last CT scan of the chest was in 2017 and she is requesting for another one. Her inhaler as ipratropium which she says she's not happy with. In the past she's uses Spiriva and Symbicort and these have caused blisters and so she is generally where you have inhalers although she wants something other than her current one.       08/19/2018  - Visit   [name].  Patient was last seen in our office visit on  07/18/2018 started on Bevespi.  Patient reports that after 1 day of use of the Bevespi inhaler she developed thrush and mouth sores.  She she contacted our office to be treated with nystatin.  Patient reports that most mouth sores resolved there remains one.  Patient is also having extensive dental work done.  Patient also has been treated for scabies as well as impetigo by dermatology recently.   Patient completed a high-res CT in 07/29/2018 that showed no real changes from baseline.  Still showing severe emphysema.   Patient reports that she continues to use her Atrovent nebulizer but is wondering if there is any other options available for her.  Patient feels that the Atrovent nebulizer is not working as well.  Patient is currently using as needed and not using it scheduled.  MMRC - Breathlessness Score 3 - I stop for breath after walking about 100 yards or after a few minutes on level ground (isle at grocery  store is 140f)  Patient reports that she has known triggers of shortness of breath with exertion, when there is high pollen counts, when she is walking, when she is outside for extended periods of time.  Patient reports she is been using 2 L via nasal cannula of oxygen with exertion as well as at rest.  Patient reports she forgot her POC at home.  She arrived to our office on room air.  Oxygen saturations 87.  Patient refused oxygen in our office and states that she does not think she needs oxygen right now.  Patient is sad and concerned regarding her limitations with vascular surgery.  Patient believes that she needs a surgery but reports that vascular team as well as cardiology does not think that she would be a good surgical candidate.  She reports that she has not heard back from Dr. BNaida Sleightoffice regarding her most recent test results.   Tests:  01/21/2016-CT chest without contrast- moderate centrilobular emphysema and diffuse bronchial wall thickening mild subpleural density in the dependent lower lobes  01/20/2016-pulmonary function test- airway obstruction and diffusion defect suggesting emphysema  Imaging:  02/11/2017-chest x-ray-stable large cardiac silhouette, lungs are hyperinflated, interstitial edema pattern unchanged from prior scans   Cardiac:  07/08/2017-echocardiogram-LV ejection fraction 50 to 516% grade 1 diastolic dysfunction      OV 12/21/2018  Subjective:  Patient ID: Laurie Horton female , DOB: 2October 20, 1947, age 74y.o. , MRN: [id number], 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  She uses oxygen and nebulizers. Last CT chest March 2017 without any mass and associated mild cor pulmonale  12/21/2018 -   Chief Complaint  Patient presents with  . Follow-up    Pt states due to being switched to a  new medication, she has had labored breathing. SOB is with exertion, has an occ cough with white phlegm, and also has had some occ CP.     Laurie KLILLAR BIANCA89y.o. -  Presents for routine follow-up.  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  She uses oxygen sporadically and nebulizer sporadically.  She is very afraid of medicines because of multiple allergies.  She says her psychiatrist Dr. AMelissa Montaneplaced her on some medications that caused a rash.  Other than that she is okay.      OV  09/15/2019  Subjective:  Patient ID: Laurie Horton, female , DOB: 08/16/1946 , age 79 y.o. , MRN: [id number] , ADDRESS: [address] [place] [address]  Follow-up emphysema with chronic hypoxemic restorative failure and associated fibromyalgia and associated chronic systolic heart failure: Overall doing well.  She uses oxygen and nebulizers. Last CT chest March 2017 without any mass and associated mild cor pulmonale   09/15/2019 -  No chief complaint on file.    Laurie Laurie Horton 74 y.o. -     ROS - per Laurie     OV 01/17/2020  Subjective:  Patient ID: Laurie Horton, female , DOB: [birth year] , age 72 y.o. , MRN: [id number] , ADDRESS: [address] [place] [address]   01/17/2020 -   Chief Complaint  Patient presents with  . Follow-up     Laurie Laurie Horton 74 y.o. -presents for face-to-face follow-up.  In December she called with a COPD flareup symptoms.  She wanted Biaxin despite her allergies.  She feels that Biaxin helps her.  She says she was given prednisone which helped but she was only given generic clarithromycin instead of the tradename Biaxin.  She says it did not work.  She wants tradename Biaxin only.  She says since then her cough is more than baseline.  She also is like sputum that is discolored.  She feels like she is in exacerbation and this is preventing her from getting the COVID-19  vaccine.  She feels another course of tradename Biaxin is required.  She again does not want generic clarithromycin.  She is compliant with her baseline nebulizer nighttime oxygen.  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  She is now recovering from that.  She talked about Covid vaccine.  She wants to get it.  He has had pneumonia vaccine without problem but recently flu shot she feels this put her in the hospital.  She also has multiple oral drug allergies.  Overall she continues to mask and follow social distancing.       CAT COPD Symptom & Quality of Life Score (GSK trademark) 0 is no burden. 5 is highest burden 07/18/2018  12/21/2018  01/17/2020   Never Cough -> Cough all the time '3 3 3  '$ No phlegm in chest -> Chest is full of phlegm '5 3 4  '$ No chest tightness -> Chest feels very tight '4 3 3  '$ No dyspnea for 1 flight stairs/hill -> Very dyspneic for 1 flight of stairs '5 3 5  '$ No limitations for ADL at home -> Very limited with ADL at home '5 4 3  '$ Confident leaving home -> Not at all confident leaving home 0 1 2  Sleep soundly -> Do not sleep soundly because of lung condition '3 4 3  '$ Lots of Energy -> No energy at all '5 4 4  '$ TOTAL Score (max 40)  '30 25 27   '$ No flowsheet data found.     ROS - per Laurie     has a past medical history of Cataract, CHF (congestive heart failure) ([facility]) (30/05/6225), Complication of anesthesia, COPD (chronic obstructive pulmonary disease) ([place]), Depression, Diverticulitis, DVT (deep venous thrombosis) ([facility]), Fibromyalgia, GERD (gastroesophageal reflux disease), Hiatal hernia, History of deviated nasal septum, HTN (hypertension), Hyperlipidemia, Hypertensive retinopathy, Obesity, On supplemental oxygen therapy, Osteoporosis, PAT (paroxysmal atrial tachycardia) ([place]), PFO (patent foramen ovale), PULMONARY NODULE, LEFT LOWER LOBE (  10/14/2009), PVD (peripheral vascular disease) with claudication  ([place]) (12/2017), Right middle lobe pneumonia (07/24/2011), Stroke ([facility]), and TOBACCO ABUSE (06/04/2009).   reports that she quit smoking about 9 years ago. Her smoking use included cigarettes. She has a 90.00 pack-year smoking history. She has never used smokeless tobacco.  Past Surgical History:  Procedure Laterality Date  . CARDIOVASCULAR STRESS TEST  12/26/2004   EF 74%. NO EVIDENCE OF ISCHEMIA  . CATARACT EXTRACTION Left    Dr. Elliot Dally  . ESOPHAGOGASTRODUODENOSCOPY (EGD) WITH PROPOFOL N/A 04/15/2015   Procedure: ESOPHAGOGASTRODUODENOSCOPY (EGD) WITH PROPOFOL;  Surgeon: Laurence Spates, MD;  Location: [facility];  Service: Endoscopy;  Laterality: N/A;  . EYE SURGERY Left    Cat Sx  . KNEE ARTHROSCOPY  2000   left  . LAPAROSCOPIC CHOLECYSTECTOMY  04-16-2010   [facility]  . LOWER EXTREMITY ANGIOGRAPHY N/A 09/09/2017   Procedure: Lower Extremity Angiography;  Surgeon: Lorretta Harp, MD;  Location: [facility];  Service: Cardiovascular;  Laterality: N/A;  . LOWER EXTREMITY INTERVENTION Left 01/17/2018   Procedure: LOWER EXTREMITY INTERVENTION;  Surgeon: Lorretta Harp, MD;  Location: [facility];  Service: Cardiovascular;  Laterality: Left;  . MOUTH SURGERY     03-26-15 multiple extractions stitches remains  . PERIPHERAL VASCULAR INTERVENTION Left 01/17/2018   Procedure: PERIPHERAL VASCULAR INTERVENTION;  Surgeon: Lorretta Harp, MD;  Location: [facility];  Service: Cardiovascular;  Laterality: Left;  COMMON ILIAC  . RIGHT/LEFT HEART CATH AND CORONARY ANGIOGRAPHY N/A 03/04/2017   Procedure: Right/Left Heart Cath and Coronary Angiography;  Surgeon: Peter M Martinique, MD;  Location: [facility];  Service: Cardiovascular;  Laterality: N/A;  . TOTAL ABDOMINAL HYSTERECTOMY     post op needed oxygen was told "she gave them a scare"  . TUBAL LIGATION    . US ECHOCARDIOGRAPHY  11/20/2009   EF 55-60%    Allergies  Allergen Reactions  . Alprazolam Anaphylaxis and Other  (See Comments)    REACTION: stops breathing  . Bee Venom Anaphylaxis  . Iodine Anaphylaxis, Swelling and Other (See Comments)    REACTION: swelling in throat  . Pseudoephedrine Hcl Er Shortness Of Breath  . Budesonide-Formoterol Fumarate Other (See Comments)    Blisters inside of mouth all over  . Crestor [Rosuvastatin Calcium] Other (See Comments)    Unable to walk  . Esomeprazole Magnesium Other (See Comments)    REACTION: "bouncing off walls"  . Flonase [Fluticasone Propionate] Other (See Comments)    NOSE BLEED  . Lamictal [Lamotrigine] Rash    Patient got rash, labored breathing, and diarrhea  . Loratadine Other (See Comments)    claritin D causes shaking  . Lotrimin [Clotrimazole] Other (See Comments)    Mouth blisters  . Lunesta [Eszopiclone] Other (See Comments)    REACTION: "slept for a week"  . Oxcarbazepine Other (See Comments)    Causes deep sleep and dizziness  . Statins Other (See Comments)    Can't walk, legs won't work   . Zolpidem Tartrate Other (See Comments)    REACTION: "slept for a week"  . Betadine [Povidone Iodine] Other (See Comments)    Breathing problems  . Bevespi Aerosphere [Glycopyrrolate-Formoterol] Other (See Comments)    Pt believes this caused mouth sores and thrush   . Clarithromycin Other (See Comments)    All "mycins", Puts into "a" fib, Will take if has to for severe sinus infection  . Effexor [Venlafaxine] Nausea And Vomiting and Other (See Comments)  cramps  . Lexapro [Escitalopram Oxalate] Other (See Comments)    hallucinations  . Aciphex [Rabeprazole Sodium] Rash  . Alendronate Sodium Other (See Comments)    "caused stomach problems for 3 days"  . Avelox [Moxifloxacin Hcl In Nacl] Other (See Comments)    Stomach cramps.   Darlin Coco [Valdecoxib] Rash  . Ceclor [Cefaclor] Rash  . Cephalexin Rash and Other (See Comments)    Pt states that she is possibly allergic to this - had a reaction to Cefaclor in the past and she does not want  to these class drugs. Added per patient request.  . Covera-Hs [Verapamil Hcl] Palpitations  . Dicyclomine Hcl Rash  . Other Other (See Comments)    Glue from ekg/heart monitor leads --rash, Any MYCINS  . Tessalon Perles Rash    Immunization History  Administered Date(s) Administered  . Influenza Split 08/03/2011, 08/01/2012  . Influenza,inj,Quad PF,6+ Mos 08/14/2013, 09/03/2014, 09/04/2015, 08/18/2016  . Influenza-Unspecified 11/03/1999  . Pneumococcal Conjugate-13 10/01/2014  . Pneumococcal Polysaccharide-23 09/03/1999, 06/02/2006, 08/14/2013  . Td 11/02/1994  . Tdap 08/18/2016    Family History  Problem Relation Age of Onset  . Dementia Mother   . Diabetes Mother   . Alzheimer's disease Mother   . Heart attack Brother 47  . Heart attack Father   . Schizophrenia Sister   . Diabetes Sister   . Tremor Sister      Current Outpatient Medications:  .  brimonidine (ALPHAGAN) 0.2 % ophthalmic solution, Place 1 drop into the right eye 2 (two) times daily., Disp: 15 mL, Rfl: 6 .  carvedilol (COREG) 12.5 MG tablet, Take 1 tablet (12.5 mg total) by mouth 2 (two) times daily., Disp: 60 tablet, Rfl: 11 .  clopidogrel (PLAVIX) 75 MG tablet, Take 1 tablet (75 mg total) by mouth daily., Disp: 30 tablet, Rfl: 5 .  desvenlafaxine (PRISTIQ) 50 MG 24 hr tablet, Take 1 tablet (50 mg total) by mouth daily., Disp: 30 tablet, Rfl: 2 .  diazepam (VALIUM) 2 MG tablet, Take 1 tablet (2 mg total) by mouth daily as needed for anxiety., Disp: 10 tablet, Rfl: 0 .  diclofenac sodium (VOLTAREN) 1 % GEL, APPLY 2 GRAMS TO BOTH HANDS 4 TIMES A DAY, Disp: , Rfl: 5 .  dorzolamide-timolol (COSOPT) 22.3-6.8 MG/ML ophthalmic solution, Place 1 drop into the right eye 2 (two) times daily., Disp: 10 mL, Rfl: 6 .  Eyelid Cleansers (OCUSOFT BABY EYELID & EYELASH EX), Apply topically., Disp: , Rfl:  .  furosemide (LASIX) 40 MG tablet, Take 1 tablet (40 mg total) by mouth daily., Disp: 90 tablet, Rfl: 3 .  gabapentin  (NEURONTIN) 100 MG capsule, Take 200 mg by mouth See admin instructions. Take 1 in am and 2 in the lunch and 1 at bedtime, Disp: , Rfl:  .  HYDROcodone-acetaminophen (NORCO/VICODIN) 5-325 MG tablet, Take 1 tablet by mouth 3 (three) times daily. , Disp: , Rfl:  .  ipratropium (ATROVENT) 0.02 % nebulizer solution, USE 1 VIAL BY NEBULIZATION 4 (FOUR) TIMES DAILY., Disp: 75 mL, Rfl: 3 .  montelukast (SINGULAIR) 10 MG tablet, TAKE 1 TABLET BY MOUTH EVERYDAY AT BEDTIME, Disp: 30 tablet, Rfl: 5 .  ondansetron (ZOFRAN-ODT) 8 MG disintegrating tablet, TAKE 1 TABLET BY MOUTH EVERY 8 HOURS AS NEEDED NAUSEA OR VOMITING, Disp: 20 tablet, Rfl: 0 .  oxybutynin (DITROPAN-XL) 10 MG 24 hr tablet, TAKE 1 TABLET BY MOUTH EVERY DAY (Patient taking differently: Take 10 mg by mouth daily. ), Disp: 90 tablet, Rfl: 1 .  OXYGEN, Inhale 1.5-2 L into the lungs as needed (for shortness of breath)., Disp: , Rfl:  .  pantoprazole (PROTONIX) 40 MG tablet, Take 1 tablet (40 mg total) by mouth daily., Disp: 90 tablet, Rfl: 1 .  polyethylene glycol (MIRALAX / GLYCOLAX) packet, Take 17 g by mouth every other day. , Disp: , Rfl:  .  rosuvastatin (CRESTOR) 5 MG tablet, TAKE 1 TABLET BY MOUTH 2 TIMES A WEEK. SUNDAY AND WEDNESDAY, Disp: 8 tablet, Rfl: 8 .  spironolactone (ALDACTONE) 25 MG tablet, TAKE 1 TABLET BY MOUTH EVERY DAY, Disp: 30 tablet, Rfl: 10 .  tiZANidine (ZANAFLEX) 2 MG tablet, Take 2 mg by mouth at bedtime. Take 1/2 in am , 1/2 at lunch and 1 at night, Disp: , Rfl:  .  traZODone (DESYREL) 100 MG tablet, Take one tab daily as needed for sleep, Disp: 30 tablet, Rfl: 2 .  Vitamin D, Ergocalciferol, (DRISDOL) 1.25 MG (50000 UNIT) CAPS capsule, Take 1 capsule (50,000 Units total) by mouth every 7 (seven) days., Disp: 16 capsule, Rfl: 0 .  warfarin (COUMADIN) 5 MG tablet, TAKE 1/2 TO 1 TABLET BY MOUTH EVERY DAY OR AS DIRECTED BY COUMADIN CLINIC (Patient taking differently: Take 2.5-5 mg by mouth See admin instructions. Take 2.5 mg  on Friday, take 5 mg on all other days.), Disp: 30 tablet, Rfl: 5 .  predniSONE (DELTASONE) 10 MG tablet, Take 4 tabs x 1 day, 3 tabs x 1 day, 2 tab x 1 day, 1 x 1 day, then 0.5 x 1 day, then stop., Disp: 11 tablet, Rfl: 0      Objective:   Vitals:   01/17/20 1143  BP: (!) 150/80  Pulse: 69  Temp: 97.6 F (36.4 C)  TempSrc: Oral  SpO2: 95%  Weight: 190 lb 9.6 oz (86.5 kg)  Height: '5\' 3"'$  (1.6 m)    Estimated body mass index is 33.76 kg/m as calculated from the following:   Height as of this encounter: '5\' 3"'$  (1.6 m).   Weight as of this encounter: 190 lb 9.6 oz (86.5 kg).  '@WEIGHTCHANGE'$ @  [employer]   01/17/20 1143  Weight: 190 lb 9.6 oz (86.5 kg)     Physical Exam Obese female seated comfortably.  Oxygen on.  Alert and oriented x3.  Speech is normal.  Barrel chested closed clear to auscultation bilaterally.  Full sentences.  No pursed lip breathing no wheezing no cyanosis no clubbing no edema.  Overall nonfocal exam         Assessment:       ICD-10-CM   1. COPD, severe ([place])  J44.9   2. COPD with acute exacerbation ([facility])  J44.1   3. Multiple drug allergies  Z88.9   4. Vaccine counseling  Z71.89        Plan:     Patient Instructions  COPD, severe ([place]) COPD with acute exacerbation ([place])   There might be an ongoing mild flareup  Plan -Please take prednisone 40 mg x1 day, then 30 mg x1 day, then 20 mg x1 day, then 10 mg x1 day, and then 5 mg x1 day and stop -Do Biaxin [no generic but do the treatment] -500 mg twice daily x5 days [per your history you are not allergic to this particular brand] -Continue baseline nebulizers and the night 1 L oxygen   Multiple drug allergies Vaccine counseling  -There is increased risk for allergic reactions after Covid vaccine given your prior history of multiple drug allergies including injectable flu shot.  Overall risk is low but still there is an 8 fold increased risk.  Plan -Based on this it is your choice for  that except the risk and take a Covid vaccine.  If you do it then you will need an extended observation time.  -Given the fact you are to protect yourself against multiple respiratory pathogens even beyond Covid in the setting of COPD I suggest masking and distancing is a good overall strategy  Plan -83-monthoffice visit     SIGNATURE    Dr. MBrand Males M.D., F.C.C.P,  Pulmonary and Critical Care Medicine Staff Physician, CAtlantaDirector - Interstitial Lung Disease  Program  Pulmonary [place] [id number] Pager: [phone] If no answer or between  15:00h - 7:00h: call [phone] Telephone: [phone]  12:45 PM 01/17/2020

## 2020-01-17 NOTE — Procedures (Signed)
     History: Laurie Horton is a 74 year old patient with a history of recent visual loss in the right eye, she has also noted some problems with mild memory impairment.  For this reason, an EEG study was ordered.  This is a routine EEG.  No skull defects are noted.  Medications include Alphagan, Coreg, Plavix, Pristiq, diazepam, Cosopt, Lasix, gabapentin, hydrocodone, Singulair, Zofran, Ditropan, Protonix, MiraLAX, Crestor, Aldactone, Zanaflex, trazodone, vitamin D, and Coumadin.  EEG classification: Delta grade 2 generalized/bitemporal  Description of the recording: The background rhythms of this recording consists of fairly well-modulated medium amplitude alpha rhythm of 9 Hz that is reactive to eye opening closure.  As the record progresses, hyperventilation is performed, this appears to be associated with some generalized delta activity of 2 Hz intermittently.  Photic stimulation is also done, this results in a minimal but bilateral photic driving response.  The intermittent delta slowing however seen throughout the recording, not just during hyperventilation.  The slowing may be fully generalized in nature or may be bitemporal in nature, and may be at times independent in the temporal regions, right or left.  At no time during the recording does there appear to be evidence of spike or spike-wave discharges.  EKG monitor shows no evidence of cardiac rhythm abnormalities with a heart rate of 78.  Impression: This is an abnormal EEG recording secondary to episodic generalized and bitemporal delta slowing.  This study suggests generalized neuronal dysfunction, or abnormalities involving the deep midline nuclei.  No clear epileptiform discharges are seen, clinical correlation is required however.  This study could be consistent with a toxic/metabolic encephalopathy.

## 2020-01-27 NOTE — Progress Notes (Signed)
Cardiology Office Note    Date:  02/01/2020   ID:  Evey, Laurie Horton 09-Jun-1946, MRN [id number]  PCP:  Rutherford Guys, MD  Cardiologist: Dr. Martinique   Chief Complaint  Patient presents with  . Atrial Fibrillation  . Coronary Artery Disease  . Hypertension    History of Present Illness:    ELLIETT WADHWANI is a 74 y.o. female with past medical history of chronic diastolic CHF, PFO, PAF (on Coumadin), COPD (on 2L [place] at baseline), HTN, and prior CVA who is seen for follow up CHF and PAD.  She was admitted from 4/9 - 02/12/2017 for worsening dyspnea on exertion and palpitations. Was in atrial fibrillation with RVR upon arrival to the ED. Echo during admission showed a newly reduced EF of 20-25% and she was diuresed with IV Lasix. Enzymes were negative and EKG showed no acute ischemic changes. It was recommended to consider a right/left heart cath in 4-6 weeks.   She did undergo right and left heart cath on 03/04/17. This showed severe 2 vessel obstructive CAD with 100% RCA occlusion, 75% OM1, and 90% small OM2. EF 25-30%. Mild pulmonary HTN with normal LV filling pressures. 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 Maximizing CHF therapy recommended.  She did have repeat Echo in September 2018 showing improvement in EF to 50-55%.   Subsequent to this she developed significant claudication. She was seen by Dr. Gwenlyn Found and had angiography showing 80% infrarenal aortic stenosis and left iliac stenosis. She also had severe right common femoral stenosis. She was seen by Dr Trula Slade for consideration of Aortobifemoral bypass. After pulmonary evaluation she was felt to be too high a risk for open surgery. In March 2019 she underwent atherectomy and covered stenting of the left iliac by Dr. Gwenlyn Found. On follow up she did have improvement in her claudication and ABIs. The aortic and right common femoral artery stenoses are not felt to be amenable to percutaneous therapy.   In early July 2019 she was  seen because she felt she was in Afib following dental procedure. On arrival she was in NSR with PACs. No medical changes made.   She has been followed by Dr Gwenlyn Found and  dopplers in May 2020 indicated continued patency of the stent. No significant claudication. She was noted to have a high grade left subclavian stenosis but it was unclear that this was symptomatic and given all her medical problems was felt best to manage medically. She had left shoulder injection by Dr Durward Fortes for adhesive capsulitis. She notes this has helped with her pain significantly. She is followed by pulmonary for COPD.   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 INR had been therapeutic. At time of infarct it was 1.9. We decided to add plavix 75 mg daily due to her extensive vascular disease. MRI showed no acute infarct but she did have evidence of multiple old strokes. She denies any chest pain or dyspnea. Weight is stable. No increase in edema. Depth perception is bad now. 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    Past Medical History:  Diagnosis Date  . Cataract    OD  . CHF (congestive heart failure) ([place]) 03/02/2017   EF 25-30% 2018  . Complication of anesthesia    various issues with oxygen  saturations  post op  . COPD (chronic obstructive pulmonary disease) ([place])   . Depression   . Diverticulitis   . DVT (deep venous thrombosis) ([place])   . Fibromyalgia   . GERD (gastroesophageal reflux disease)   . Hiatal hernia   . History of deviated nasal septum    left- side  . HTN (hypertension)   . Hyperlipidemia   . Hypertensive retinopathy    OU  . Obesity   . On supplemental oxygen therapy    concentrator at night @ 1.5 l/m or when sleeps. O2 Sat niormally 87.  . Osteoporosis   . PAT (paroxysmal atrial tachycardia) ([place])   . PFO (patent foramen ovale)   .  PULMONARY NODULE, LEFT LOWER LOBE 10/14/2009   83mm LLL nodule dec 2010. Stable and 84mm in Oct 2012. No further fu  . PVD (peripheral vascular disease) with claudication ([place]) 12/2017  . Right middle lobe pneumonia 07/24/2011   First noted at admit 07/10/11. Persists on cxr 07/22/11. Cleared on CT 08/24/11. No further followup  . Stroke ([place])   . TOBACCO ABUSE 06/04/2009    Past Surgical History:  Procedure Laterality Date  . CARDIOVASCULAR STRESS TEST  12/26/2004   EF 74%. NO EVIDENCE OF ISCHEMIA  . CATARACT EXTRACTION Left    Dr. Elliot Dally  . ESOPHAGOGASTRODUODENOSCOPY (EGD) WITH PROPOFOL N/A 04/15/2015   Procedure: ESOPHAGOGASTRODUODENOSCOPY (EGD) WITH PROPOFOL;  Surgeon: Laurence Spates, MD;  Location: [facility];  Service: Endoscopy;  Laterality: N/A;  . EYE SURGERY Left    Cat Sx  . KNEE ARTHROSCOPY  2000   left  . LAPAROSCOPIC CHOLECYSTECTOMY  04-16-2010   [place]  . LOWER EXTREMITY ANGIOGRAPHY N/A 09/09/2017   Procedure: Lower Extremity Angiography;  Surgeon: Lorretta Harp, MD;  Location: [facility];  Service: Cardiovascular;  Laterality: N/A;  . LOWER EXTREMITY INTERVENTION Left 01/17/2018   Procedure: LOWER EXTREMITY INTERVENTION;  Surgeon: Lorretta Harp, MD;  Location: [facility];  Service: Cardiovascular;  Laterality: Left;  . MOUTH SURGERY     03-26-15 multiple extractions stitches remains  . PERIPHERAL VASCULAR INTERVENTION Left 01/17/2018   Procedure: PERIPHERAL VASCULAR INTERVENTION;  Surgeon: Lorretta Harp, MD;  Location: [facility];  Service: Cardiovascular;  Laterality: Left;  COMMON ILIAC  . RIGHT/LEFT HEART CATH AND CORONARY ANGIOGRAPHY N/A 03/04/2017   Procedure: Right/Left Heart Cath and Coronary Angiography;  Surgeon:  M Martinique, MD;  Location: [facility];  Service: Cardiovascular;  Laterality: N/A;  . TOTAL ABDOMINAL HYSTERECTOMY     post op needed oxygen was told "she gave them a scare"  . TUBAL LIGATION    . US  ECHOCARDIOGRAPHY  11/20/2009   EF 55-60%    Current Medications: Outpatient Medications Prior to Visit  Medication Sig Dispense Refill  . brimonidine (ALPHAGAN) 0.2 % ophthalmic solution Place 1 drop into the right eye 2 (two) times daily. 15 mL 6  . carvedilol (COREG) 12.5 MG tablet Take 1 tablet (12.5 mg total) by mouth 2 (two) times daily. 60 tablet 11  . clopidogrel (PLAVIX) 75 MG tablet Take 1 tablet (75 mg total) by mouth daily. 30 tablet 5  . desvenlafaxine (PRISTIQ) 50 MG 24 hr tablet Take 1 tablet (50 mg total) by mouth daily. 30 tablet 2  . diazepam (VALIUM) 2 MG tablet Take 1 tablet (2 mg total) by mouth daily as needed for anxiety. 10 tablet 0  . diclofenac sodium (VOLTAREN) 1 % GEL APPLY 2 GRAMS TO BOTH HANDS  4 TIMES A DAY  5  . dorzolamide-timolol (COSOPT) 22.3-6.8 MG/ML ophthalmic solution Place 1 drop into the right eye 2 (two) times daily. 10 mL 6  . Eyelid Cleansers (OCUSOFT BABY EYELID & EYELASH EX) Apply topically.    . furosemide (LASIX) 40 MG tablet Take 1 tablet (40 mg total) by mouth daily. 90 tablet 3  . gabapentin (NEURONTIN) 100 MG capsule Take 200 mg by mouth See admin instructions. Take 1 in am and 2 in the lunch and 1 at bedtime    . HYDROcodone-acetaminophen (NORCO/VICODIN) 5-325 MG tablet Take 1 tablet by mouth 3 (three) times daily.     Marland Kitchen ipratropium (ATROVENT) 0.02 % nebulizer solution USE 1 VIAL BY NEBULIZATION 4 (FOUR) TIMES DAILY. 75 mL 3  . montelukast (SINGULAIR) 10 MG tablet TAKE 1 TABLET BY MOUTH EVERYDAY AT BEDTIME 30 tablet 5  . ondansetron (ZOFRAN-ODT) 8 MG disintegrating tablet TAKE 1 TABLET BY MOUTH EVERY 8 HOURS AS NEEDED NAUSEA OR VOMITING 20 tablet 0  . oxybutynin (DITROPAN-XL) 10 MG 24 hr tablet TAKE 1 TABLET BY MOUTH EVERY DAY (Patient taking differently: Take 10 mg by mouth daily. ) 90 tablet 1  . OXYGEN Inhale 1.5-2 L into the lungs as needed (for shortness of breath).    . pantoprazole (PROTONIX) 40 MG tablet Take 1 tablet (40 mg total) by  mouth daily. 90 tablet 1  . polyethylene glycol (MIRALAX / GLYCOLAX) packet Take 17 g by mouth every other day.     . rosuvastatin (CRESTOR) 5 MG tablet TAKE 1 TABLET BY MOUTH 2 TIMES A WEEK. SUNDAY AND WEDNESDAY 8 tablet 8  . spironolactone (ALDACTONE) 25 MG tablet TAKE 1 TABLET BY MOUTH EVERY DAY 30 tablet 10  . tiZANidine (ZANAFLEX) 2 MG tablet Take 2 mg by mouth at bedtime. Take 1/2 in am , 1/2 at lunch and 1 at night    . traZODone (DESYREL) 100 MG tablet Take one tab daily as needed for sleep 30 tablet 2  . Vitamin D, Ergocalciferol, (DRISDOL) 1.25 MG (50000 UNIT) CAPS capsule Take 1 capsule (50,000 Units total) by mouth every 7 (seven) days. 16 capsule 0  . warfarin (COUMADIN) 5 MG tablet TAKE 1/2 TO 1 TABLET BY MOUTH EVERY DAY OR AS DIRECTED BY COUMADIN CLINIC (Patient taking differently: Take 2.5-5 mg by mouth See admin instructions. Take 2.5 mg on Friday, take 5 mg on all other days.) 30 tablet 5  . clarithromycin (BIAXIN) 500 MG tablet Take 1 tablet (500 mg total) by mouth 2 (two) times daily. (Patient not taking: Reported on 02/01/2020) 10 tablet 0  . predniSONE (DELTASONE) 10 MG tablet Take 4 tabs x 1 day, 3 tabs x 1 day, 2 tab x 1 day, 1 x 1 day, then 0.5 x 1 day, then stop. (Patient not taking: Reported on 02/01/2020) 11 tablet 0   No facility-administered medications prior to visit.     Allergies:   Alprazolam, Bee venom, Iodine, Pseudoephedrine hcl er, Budesonide-formoterol fumarate, Crestor [rosuvastatin calcium], Esomeprazole magnesium, Flonase [fluticasone propionate], Lamictal [lamotrigine], Loratadine, Lotrimin [clotrimazole], Lunesta [eszopiclone], Oxcarbazepine, Statins, Zolpidem tartrate, Betadine [povidone iodine], Bevespi aerosphere [glycopyrrolate-formoterol], Clarithromycin, Effexor [venlafaxine], Lexapro [escitalopram oxalate], Aciphex [rabeprazole sodium], Alendronate sodium, Avelox [moxifloxacin hcl in nacl], Bextra [valdecoxib], Ceclor [cefaclor], Cephalexin, Covera-hs  [verapamil hcl], Dicyclomine hcl, Other, and Tessalon perles   Social History   Socioeconomic History  . Marital status: Divorced    Spouse name: Not on file  . Number of children: 2  . Years of education:  Not on file  . Highest education level: Not on file  Occupational History  . Occupation: disability  Tobacco Use  . Smoking status: Former Smoker    Packs/day: 3.00    Years: 30.00    Pack years: 90.00    Types: Cigarettes    Quit date: 06/02/2010    Years since quitting: 9.6  . Smokeless tobacco: Never Used  . Tobacco comment: QUIT IN 2011  Substance and Sexual Activity  . Alcohol use: No    Alcohol/week: 0.0 standard drinks  . Drug use: No  . Sexual activity: Yes    Partners: Male    Birth control/protection: Post-menopausal, Surgical  Other Topics Concern  . Not on file  Social History Narrative   Marital status: divorced in 1978 after ten years; dating casually in 2019.      Children: 3 biological children; 3 court appointed children; 6 grandchildren; 5 gg      Lives: alone; children in [place] and [place].      Employment: retired age 75; disability for COPD, CVA at age 41.      Tobacco: former smoker; quit smoking 2011.      Alcohol: none      Exercise: walks dog four times per day; goes to pool three times per week.      ADLs: drives; no assistant devices; does have a walker.  Cleaning is limited in 2018.  Daughter helps with cleaning.  Does own grocery shopping.      Advanced Directives: YES; DNR/DNI; HCPOA: Rolan Lipa Martin/daughter youngest.  Blind.               Social Determinants of Health   Financial Resource Strain:   . Difficulty of Paying Living Expenses:   Food Insecurity:   . Worried About Charity fundraiser in the Last Year:   . Arboriculturist in the Last Year:   Transportation Needs:   . Film/video editor (Medical):   Marland Kitchen Lack of Transportation (Non-Medical):   Physical Activity:   . Days of Exercise per Week:   . Minutes of  Exercise per Session:   Stress:   . Feeling of Stress :   Social Connections:   . Frequency of Communication with Friends and Family:   . Frequency of Social Gatherings with Friends and Family:   . Attends Religious Services:   . Active Member of Clubs or Organizations:   . Attends Archivist Meetings:   Marland Kitchen Marital Status:      Family History:  The patient's family history includes Alzheimer's disease in her mother; Dementia in her mother; Diabetes in her mother and sister; Heart attack in her father; Heart attack (age of onset: 50) in her brother; Schizophrenia in her sister; Tremor in her sister.   Review of Systems:   As noted in HPI.  All other systems reviewed and are otherwise negative except as noted above.   Physical Exam:    VS:  BP 130/70   Pulse 62   Ht 5\' 3"  (1.6 m)   Wt 193 lb 3.2 oz (87.6 kg)   SpO2 (!) 89%   BMI 34.22 kg/m    GENERAL:  Well appearing overweight WF in NAD HEENT:  PERRL, EOMI, sclera are clear. Oropharynx is clear. NECK:  No jugular venous distention, carotid upstroke brisk and symmetric, left subclavian  bruit, no thyromegaly or adenopathy LUNGS:  Clear to auscultation bilaterally CHEST:  Unremarkable HEART:  RRR,  PMI not displaced  or sustained,S1 and S2 within normal limits, no S3, no S4: no clicks, no rubs, no murmurs ABD:  Soft, nontender. BS +, no masses or bruits. No hepatomegaly, no splenomegaly EXT:  Poor pedal pulses, absent left radial pulse.  no edema, no cyanosis no clubbing SKIN:  Warm and dry.  No rashes NEURO:  Alert and oriented x 3. Cranial nerves II through XII intact. PSYCH:  Cognitively intact    Wt Readings from Last 3 Encounters:  02/01/20 193 lb 3.2 oz (87.6 kg)  01/17/20 190 lb 9.6 oz (86.5 kg)  12/25/19 193 lb (87.5 kg)     Studies/Labs Reviewed:   Recent Labs: 11/22/2019: ALT 15; BUN 12; Creatinine, Ser 1.16; Hemoglobin 12.9; Platelets 240; Potassium 3.7; Sodium 137 12/25/2019: TSH 3.760   Lipid  Panel    Component Value Date/Time   CHOL 167 11/13/2019 0828   TRIG 164 (H) 11/13/2019 0828   HDL 39 (L) 11/13/2019 0828   CHOLHDL 4.3 11/13/2019 0828   CHOLHDL 4.6 07/02/2016 0823   VLDL 25 07/02/2016 0823   LDLCALC 99 11/13/2019 0828    Additional studies/ records that were reviewed today include:   Echocardiogram: 02/09/2017 Study Conclusions  - Left ventricle: The cavity size was mildly dilated. Wall   thickness was increased in a pattern of mild LVH. Systolic   function was severely reduced. The estimated ejection fraction   was in the range of 20% to 25%. Diffuse hypokinesis. Doppler   parameters are consistent with abnormal left ventricular   relaxation (grade 1 diastolic dysfunction). - Aortic valve: Valve area (Vmax): 1.4 cm^2. - Aortic root: The aortic root was mildly dilated. - Ascending aorta: The ascending aorta was mildly dilated. - Mitral valve: Calcified annulus. There was moderate   regurgitation. - Left atrium: The atrium was moderately dilated. - Right ventricle: Systolic function was moderately reduced. - Pulmonary arteries: Systolic pressure was mildly increased. - Pericardium, extracardiac: A trivial pericardial effusion was   identified.  Impressions:  - No subcostal views; severe global reduction in LV systolic   function; grade 1 diastolic dysfunction; mildly dilated aortic   root and ascending aorta; moderate MR; moderaet LAE; moderately   reduced RV function; mild TR; mildly elevated pulmonary pressure.  Procedures   Right/Left Heart Cath and Coronary Angiography  Conclusion     Ost 1st Mrg to 1st Mrg lesion, 75 %stenosed.  2nd Mrg lesion, 90 %stenosed.  Ost RCA to Mid RCA lesion, 100 %stenosed.  There is severe left ventricular systolic dysfunction.  LV end diastolic pressure is normal.  The left ventricular ejection fraction is 25-35% by visual estimate.  Hemodynamic findings consistent with mild pulmonary hypertension.   LV end diastolic pressure is normal.   1. Severe 2 vessel obstructive CAD    - 75% proximal OM1    - 90% small OM2    - 100% proximal RCA. Left to right collaterals.  2. Severe LV dysfunction 3. Mild pulmonary HTN with normal LV filling pressures.  4. Cardiac index 2.41 L/min/BSA   Plan: Medical management to try and optimize CHF therapy. Patient appears to be adequately diuresed at this time. Based on these results her cardiomyopathy is ischemic. I would treat her CAD medically. If cardiac cath is needed in the future would consider alternative access given difficulty from the right radial approach. Her rhythm during procedure is a multifocal atrial rhythm/tachycardia.     Echo 07/08/17: Study Conclusions  - Left ventricle: The cavity size was normal. There was moderate  concentric hypertrophy. Systolic function was normal. The   estimated ejection fraction was in the range of 50% to 55%.   Severe hypokinesis of the basal-midinferior myocardium;   consistent with infarction in the distribution of the right   coronary artery. Doppler parameters are consistent with abnormal   left ventricular relaxation (grade 1 diastolic dysfunction). - Mitral valve: Calcified annulus.  Impressions:  - Compared to April 2018 there is marked improvement in contraction   of all LV wall segemnts except for the inferior wall, which   remains severely hypokinetic. There is also marked reduction in   the severity of mitral insufficiency.   Assessment:    1. Chronic systolic CHF (congestive heart failure) ([place])   2. PAF (paroxysmal atrial fibrillation) ([place])   3. Essential hypertension   4. Coronary artery disease of native artery of native heart with stable angina pectoris ([name])   5. PAD (peripheral artery disease) ([place])      Plan:   In order of problems listed above:  1. Chronic Combined Systolic and Diastolic CHF/ New Cardiomyopathy - EF 25-30% in 2018 with ischemic cardiomyopathy.  Not a candidate for revascularization. With medical management EF improved to 50-55% by Echo.  - she is euvolemic at this time. Weight is stable.  - continue BB, aldactone, and statin. ARB discontinued due to orthostatic dizziness and BP is stable. Will continue current therapy.   2. Paroxysmal Atrial Fibrillation/ multifocal atrial tachycardia - This patients CHA2DS2-VASc Score and unadjusted Ischemic Stroke Rate (% per year) is equal to 9.7 % stroke rate/year from a score of 6 (CHF, HTN, Female, Age, CVA (2)). She denies any evidence of active bleeding. Continue Coumadin for anticoagulation. Check INR today.  3. HTN- controlled.   4. COPD- - followed by Pulmonology.   5. PFO - no plans for closure   6. PAD s/p covered stenting of left iliac. Stenosis in distal aorta and right femoral not amenable to percutaneous therapy. Left subclavian stenosis. Claudication is stable. Followed by Dr Gwenlyn Found. I suspect most of her left leg pain currently is due to her arthritis.   7. CAD. 100% RCA. 75% OM. Chronic stable angina class 1. Continue medical management.   8. S/p right retinal artery occlusion. Plavix added to coumadin due to severe vascular disease. I feel benefit of Plavix/coumadin combination outweighs risk of bleeding at this time.  9. Old CVAs noted on MRI    I will follow up in 4 months.   Signed,  Martinique, MD  02/01/2020 8:40 AM    [facility] [address] [place], [place] [address] Phone: [phone]

## 2020-02-01 ENCOUNTER — Ambulatory Visit (INDEPENDENT_AMBULATORY_CARE_PROVIDER_SITE_OTHER): Payer: Medicare Other | Admitting: Cardiology

## 2020-02-01 ENCOUNTER — Ambulatory Visit (INDEPENDENT_AMBULATORY_CARE_PROVIDER_SITE_OTHER): Payer: Medicare Other | Admitting: Pharmacist

## 2020-02-01 ENCOUNTER — Encounter: Payer: Self-pay | Admitting: Cardiology

## 2020-02-01 ENCOUNTER — Other Ambulatory Visit: Payer: Self-pay

## 2020-02-01 VITALS — BP 130/70 | HR 62 | Ht 63.0 in | Wt 193.2 lb

## 2020-02-01 DIAGNOSIS — I25118 Atherosclerotic heart disease of native coronary artery with other forms of angina pectoris: Secondary | ICD-10-CM | POA: Diagnosis not present

## 2020-02-01 DIAGNOSIS — I10 Essential (primary) hypertension: Secondary | ICD-10-CM | POA: Diagnosis not present

## 2020-02-01 DIAGNOSIS — I480 Paroxysmal atrial fibrillation: Secondary | ICD-10-CM

## 2020-02-01 DIAGNOSIS — I739 Peripheral vascular disease, unspecified: Secondary | ICD-10-CM

## 2020-02-01 DIAGNOSIS — Z7901 Long term (current) use of anticoagulants: Secondary | ICD-10-CM | POA: Diagnosis not present

## 2020-02-01 DIAGNOSIS — I5022 Chronic systolic (congestive) heart failure: Secondary | ICD-10-CM | POA: Diagnosis not present

## 2020-02-01 LAB — POCT INR: INR: 3 (ref 2.0–3.0)

## 2020-02-03 ENCOUNTER — Other Ambulatory Visit: Payer: Self-pay | Admitting: Family Medicine

## 2020-02-03 NOTE — Telephone Encounter (Signed)
Requested  medications are  due for refill today yes  Requested medications are on the active medication list yes  Last refill 1/15  Last visit 12/2019  Notes to clinic Not delegated

## 2020-02-06 NOTE — Telephone Encounter (Signed)
Pt requesting refill high dose Vit D2 50,000 units is this acceptable?

## 2020-02-06 NOTE — Progress Notes (Signed)
Kindly inform the patient that EEG study showed slowing of the brain activity on both sides which may be result of underlying shrinkage of the brain and memory difficulties that she is having.  No definite seizure activity noted

## 2020-02-07 ENCOUNTER — Other Ambulatory Visit: Payer: Self-pay | Admitting: Physician Assistant

## 2020-02-14 NOTE — Telephone Encounter (Signed)
Spoke to patient about refill request for Vitamin D, per patient she picked it up on Sunday. She will call to schedule appointment with Dr Pamella Pert for June.

## 2020-02-19 NOTE — Progress Notes (Signed)
[facility] Note  02/21/2020     CHIEF COMPLAINT Patient presents for Retina Follow Up   HISTORY OF PRESENT ILLNESS: Laurie Horton is a 74 y.o. female who presents to the clinic today for:   HPI    Retina Follow Up    Patient presents with  CRVO/BRVO.  In right eye.  This started 11 weeks ago.  Severity is moderate.  I, the attending physician,  performed the HPI with the patient and updated documentation appropriately.          Comments    Patient here for 11 weeks retina follow up for CRAO OD. Patient states vision some am OD can see peripherally temporally. Its blurry. Has eye pain-burns when uses drops that start with D. Uses blue top and purple tops drops. Used this am. Vision OS some days better that other days.        Last edited by Bernarda Caffey, MD on 02/21/2020  9:31 AM. (History)    pt had appt with Dr. Parke Simmers, but states she feels like she did not have a good exam due to the tech that did her refraction, pt states her cardiologist and neurologist have not changed any medications, pt is double anticoagulant on Warfarin and Plavix, pt states Cosopt burns when she uses it, pt reports her vision in her right eye seems to be better in the mornings and then clouds over throughout the day  Referring physician: Rutherford Guys, MD [address] [place],  [place] [address]  HISTORICAL INFORMATION:   Selected notes from the MEDICAL RECORD NUMBER Referred by Dr. Martinique DeMarco for concern of artery occlusion OD LEE: 01.20.21 (J. DeMarco) Ocular Hx- PMH-   CURRENT MEDICATIONS: Current Outpatient Medications (Ophthalmic Drugs)  Medication Sig  . brimonidine (ALPHAGAN) 0.2 % ophthalmic solution Place 1 drop into the right eye 2 (two) times daily.  . dorzolamide-timolol (COSOPT) 22.3-6.8 MG/ML ophthalmic solution Place 1 drop into the right eye 2 (two) times daily.   No current facility-administered medications for this visit. (Ophthalmic Drugs)    Current Outpatient Medications (Other)  Medication Sig  . carvedilol (COREG) 12.5 MG tablet Take 1 tablet (12.5 mg total) by mouth 2 (two) times daily.  . clopidogrel (PLAVIX) 75 MG tablet Take 1 tablet (75 mg total) by mouth daily.  Marland Kitchen desvenlafaxine (PRISTIQ) 50 MG 24 hr tablet Take 1 tablet (50 mg total) by mouth daily.  . diazepam (VALIUM) 2 MG tablet Take 1 tablet (2 mg total) by mouth daily as needed for anxiety.  . diclofenac sodium (VOLTAREN) 1 % GEL APPLY 2 GRAMS TO BOTH HANDS 4 TIMES A DAY  . Eyelid Cleansers (OCUSOFT BABY EYELID & EYELASH EX) Apply topically.  . furosemide (LASIX) 40 MG tablet Take 1 tablet (40 mg total) by mouth daily.  Marland Kitchen gabapentin (NEURONTIN) 100 MG capsule Take 200 mg by mouth See admin instructions. Take 1 in am and 2 in the lunch and 1 at bedtime  . HYDROcodone-acetaminophen (NORCO/VICODIN) 5-325 MG tablet Take 1 tablet by mouth 3 (three) times daily.   Marland Kitchen ipratropium (ATROVENT) 0.02 % nebulizer solution USE 1 VIAL BY NEBULIZATION 4 (FOUR) TIMES DAILY.  . montelukast (SINGULAIR) 10 MG tablet TAKE 1 TABLET BY MOUTH EVERYDAY AT BEDTIME  . ondansetron (ZOFRAN-ODT) 8 MG disintegrating tablet TAKE 1 TABLET BY MOUTH EVERY 8 HOURS AS NEEDED NAUSEA OR VOMITING  . oxybutynin (DITROPAN-XL) 10 MG 24 hr tablet TAKE 1 TABLET BY MOUTH EVERY DAY (  Patient taking differently: Take 10 mg by mouth daily. )  . OXYGEN Inhale 1.5-2 L into the lungs as needed (for shortness of breath).  . pantoprazole (PROTONIX) 40 MG tablet Take 1 tablet (40 mg total) by mouth daily.  . polyethylene glycol (MIRALAX / GLYCOLAX) packet Take 17 g by mouth every other day.   . rosuvastatin (CRESTOR) 5 MG tablet TAKE 1 TABLET BY MOUTH 2 TIMES A WEEK. SUNDAY AND WEDNESDAY  . spironolactone (ALDACTONE) 25 MG tablet TAKE 1 TABLET BY MOUTH EVERY DAY  . tiZANidine (ZANAFLEX) 2 MG tablet Take 2 mg by mouth at bedtime. Take 1/2 in am , 1/2 at lunch and 1 at night  . traZODone (DESYREL) 100 MG tablet Take one  tab daily as needed for sleep  . Vitamin D, Ergocalciferol, (DRISDOL) 1.25 MG (50000 UNIT) CAPS capsule TAKE 1 CAPSULE (50,000 UNITS TOTAL) BY MOUTH EVERY 7 (SEVEN) DAYS.  Marland Kitchen warfarin (COUMADIN) 5 MG tablet TAKE 1/2 TO 1 TABLET BY MOUTH EVERY DAY OR AS DIRECTED BY COUMADIN CLINIC (Patient taking differently: Take 2.5-5 mg by mouth See admin instructions. Take 2.5 mg on Friday, take 5 mg on all other days.)   No current facility-administered medications for this visit. (Other)      REVIEW OF SYSTEMS: ROS    Positive for: Gastrointestinal, Musculoskeletal, Cardiovascular, Eyes, Respiratory   Negative for: Constitutional, Neurological, Skin, Genitourinary, HENT, Endocrine, Psychiatric, Allergic/Imm, Heme/Lymph   Last edited by Theodore Demark, COA on 02/21/2020  8:40 AM. (History)       ALLERGIES Allergies  Allergen Reactions  . Alprazolam Anaphylaxis and Other (See Comments)    REACTION: stops breathing  . Bee Venom Anaphylaxis  . Iodine Anaphylaxis, Swelling and Other (See Comments)    REACTION: swelling in throat  . Pseudoephedrine Hcl Er Shortness Of Breath  . Budesonide-Formoterol Fumarate Other (See Comments)    Blisters inside of mouth all over  . Crestor [Rosuvastatin Calcium] Other (See Comments)    Unable to walk  . Esomeprazole Magnesium Other (See Comments)    REACTION: "bouncing off walls"  . Flonase [Fluticasone Propionate] Other (See Comments)    NOSE BLEED  . Lamictal [Lamotrigine] Rash    Patient got rash, labored breathing, and diarrhea  . Loratadine Other (See Comments)    claritin D causes shaking  . Lotrimin [Clotrimazole] Other (See Comments)    Mouth blisters  . Lunesta [Eszopiclone] Other (See Comments)    REACTION: "slept for a week"  . Oxcarbazepine Other (See Comments)    Causes deep sleep and dizziness  . Statins Other (See Comments)    Can't walk, legs won't work   . Zolpidem Tartrate Other (See Comments)    REACTION: "slept for a week"  .  Betadine [Povidone Iodine] Other (See Comments)    Breathing problems  . Bevespi Aerosphere [Glycopyrrolate-Formoterol] Other (See Comments)    Pt believes this caused mouth sores and thrush   . Clarithromycin Other (See Comments)    All "mycins", Puts into "a" fib, Will take if has to for severe sinus infection  . Effexor [Venlafaxine] Nausea And Vomiting and Other (See Comments)    cramps  . Lexapro [Escitalopram Oxalate] Other (See Comments)    hallucinations  . Aciphex [Rabeprazole Sodium] Rash  . Alendronate Sodium Other (See Comments)    "caused stomach problems for 3 days"  . Avelox [Moxifloxacin Hcl In Nacl] Other (See Comments)    Stomach cramps.   Darlin Coco [Valdecoxib] Rash  . Ceclor [  Cefaclor] Rash  . Cephalexin Rash and Other (See Comments)    Pt states that she is possibly allergic to this - had a reaction to Cefaclor in the past and she does not want to these class drugs. Added per patient request.  . Covera-Hs [Verapamil Hcl] Palpitations  . Dicyclomine Hcl Rash  . Other Other (See Comments)    Glue from ekg/heart monitor leads --rash, Any MYCINS  . Tessalon Perles Rash    PAST MEDICAL HISTORY Past Medical History:  Diagnosis Date  . Cataract    OD  . CHF (congestive heart failure) ([facility]) 03/02/2017   EF 25-30% 2018  . Complication of anesthesia    various issues with oxygen  saturations post op  . COPD (chronic obstructive pulmonary disease) ([place])   . Depression   . Diverticulitis   . DVT (deep venous thrombosis) ([facility] [facility])   . Fibromyalgia   . GERD (gastroesophageal reflux disease)   . Hiatal hernia   . History of deviated nasal septum    left- side  . HTN (hypertension)   . Hyperlipidemia   . Hypertensive retinopathy    OU  . Obesity   . On supplemental oxygen therapy    concentrator at night @ 1.5 l/m or when sleeps. O2 Sat niormally 87.  . Osteoporosis   . PAT (paroxysmal atrial tachycardia) ([facility])   . PFO (patent foramen ovale)   . PULMONARY NODULE,  LEFT LOWER LOBE 10/14/2009   64mm LLL nodule dec 2010. Stable and 17mm in Oct 2012. No further fu  . PVD (peripheral vascular disease) with claudication ([place]) 12/2017  . Right middle lobe pneumonia 07/24/2011   First noted at admit 07/10/11. Persists on cxr 07/22/11. Cleared on CT 08/24/11. No further followup  . Stroke ([facility])   . TOBACCO ABUSE 06/04/2009   Past Surgical History:  Procedure Laterality Date  . CARDIOVASCULAR STRESS TEST  12/26/2004   EF 74%. NO EVIDENCE OF ISCHEMIA  . CATARACT EXTRACTION Left    Dr. Elliot Dally  . ESOPHAGOGASTRODUODENOSCOPY (EGD) WITH PROPOFOL N/A 04/15/2015   Procedure: ESOPHAGOGASTRODUODENOSCOPY (EGD) WITH PROPOFOL;  Surgeon: Laurence Spates, MD;  Location: [facility];  Service: Endoscopy;  Laterality: N/A;  . EYE SURGERY Left    Cat Sx  . KNEE ARTHROSCOPY  2000   left  . LAPAROSCOPIC CHOLECYSTECTOMY  04-16-2010   [facility]  . LOWER EXTREMITY ANGIOGRAPHY N/A 09/09/2017   Procedure: Lower Extremity Angiography;  Surgeon: Lorretta Harp, MD;  Location: [facility];  Service: Cardiovascular;  Laterality: N/A;  . LOWER EXTREMITY INTERVENTION Left 01/17/2018   Procedure: LOWER EXTREMITY INTERVENTION;  Surgeon: Lorretta Harp, MD;  Location: [facility];  Service: Cardiovascular;  Laterality: Left;  . MOUTH SURGERY     03-26-15 multiple extractions stitches remains  . PERIPHERAL VASCULAR INTERVENTION Left 01/17/2018   Procedure: PERIPHERAL VASCULAR INTERVENTION;  Surgeon: Lorretta Harp, MD;  Location: [facility];  Service: Cardiovascular;  Laterality: Left;  COMMON ILIAC  . RIGHT/LEFT HEART CATH AND CORONARY ANGIOGRAPHY N/A 03/04/2017   Procedure: Right/Left Heart Cath and Coronary Angiography;  Surgeon: Peter M Martinique, MD;  Location: [facility];  Service: Cardiovascular;  Laterality: N/A;  . TOTAL ABDOMINAL HYSTERECTOMY     post op needed oxygen was told "she gave them a scare"  . TUBAL LIGATION    . US ECHOCARDIOGRAPHY  11/20/2009    EF 55-60%    FAMILY HISTORY Family History  Problem Relation Age of Onset  .  Dementia Mother   . Diabetes Mother   . Alzheimer's disease Mother   . Heart attack Brother 63  . Heart attack Father   . Schizophrenia Sister   . Diabetes Sister   . Tremor Sister     SOCIAL HISTORY Social History   Tobacco Use  . Smoking status: Former Smoker    Packs/day: 3.00    Years: 30.00    Pack years: 90.00    Types: Cigarettes    Quit date: 06/02/2010    Years since quitting: 9.7  . Smokeless tobacco: Never Used  . Tobacco comment: QUIT IN 2011  Substance Use Topics  . Alcohol use: No    Alcohol/week: 0.0 standard drinks  . Drug use: No         OPHTHALMIC EXAM:  Base Eye Exam    Visual Acuity (Snellen - Linear)      Right Left   Dist [place] HM 20/40 -1   Dist ph [place] NI 20/25 +1       Tonometry (Tonopen, 8:35 AM)      Right Left   Pressure 10 13       Gonioscopy ([place] four mirror)      Right Left   Temporal Grade 4    Nasal Grade 4    Superior Grade 4    Inferior Grade 4   No NVI/NVA OD       Pupils      Dark Light Shape React APD   Right 3 3 Round Minimal None   Left 3 2 Round Brisk None       Visual Fields (Counting fingers)      Left Right    Full    Restrictions  Total superior temporal, inferior temporal, superior nasal, inferior nasal deficiencies       Extraocular Movement      Right Left    Full, Ortho Full, Ortho       Neuro/Psych    Oriented x3: Yes   Mood/Affect: Normal       Dilation    Both eyes: 1.0% Mydriacyl, 2.5% Phenylephrine @ 8:34 AM        Slit Lamp and Fundus Exam    Slit Lamp Exam      Right Left   Lids/Lashes Dermatochalasis - upper lid, Meibomian gland dysfunction Dermatochalasis - upper lid, Meibomian gland dysfunction   Conjunctiva/Sclera Nasal Pinguecula; otherwise white and quiet Nasal Pinguecula; otherwise white and quiet   Cornea Arcus Arcus, 1+ Punctate epithelial erosions, well healed temporal cataract wounds,  irregular epi inferiorly, decreased TBUT   Anterior Chamber deep, narrow temporal angle, no NVI deep, narrow temporal angle   Iris Round and dilated, No NVI, mild anterior bowing Round and dilated, No NVI   Lens 3-4+ Nuclear sclerosis with brunescence, 2-3+ Cortical cataract PC IOL in good position, 1-2+ Posterior capsular opacification   Vitreous Vitreous syneresis Vitreous syneresis       Fundus Exam      Right Left   Disc Pink and Sharp, disc heme at 0100, mild hyperemia, fine vessels ?early NVD Pink and Sharp   C/D Ratio 0.2 0.1   Macula Flat, blunted foveal reflex; atrophy  Flat, good foveal reflex, mild Retinal pigment epithelial mottling, No heme or edema   Vessels Severe Vascular attenuation, but no box-carring, AV crossing changes Vascular attenuation, Tortuous, mild AV crossing changes   Periphery Attached, mild reticular degeneration, No heme  Attached, mild reticular degeneration, No heme  IMAGING AND PROCEDURES  Imaging and Procedures for @TODAY @  OCT, Retina - OU - Both Eyes       Right Eye Quality was good. Central Foveal Thickness: 234. Progression has worsened. Findings include no IRF, no SRF, intraretinal hyper-reflective material, abnormal foveal contour, inner retinal atrophy (Interval improvement in [facility] -- now with inner retinal atrophy).   Left Eye Quality was good. Central Foveal Thickness: 284. Progression has been stable. Findings include normal foveal contour, no IRF, no SRF.   Notes *Images captured and stored on drive  Diagnosis / Impression:  OD: Interval improvement in [facility] -- now with inner retinal atrophy  OS: NFP, no IRF/SRF  Clinical management:  See below  Abbreviations: NFP - Normal foveal profile. CME - cystoid macular edema. PED - pigment epithelial detachment. IRF - intraretinal fluid. SRF - subretinal fluid. EZ - ellipsoid zone. ERM - epiretinal membrane. ORA - outer retinal atrophy. ORT - outer retinal tubulation. SRHM -  subretinal hyper-reflective material                 ASSESSMENT/PLAN:    ICD-10-CM   1. Central retinal artery occlusion of right eye  H34.11   2. Retinal edema  H35.81 OCT, Retina - OU - Both Eyes  3. Hypertensive retinopathy of both eyes  H35.033   4. Essential hypertension  I10   5. Pseudophakia  Z96.1   6. Combined forms of age-related cataract of right eye  H25.811     1,2. Central retinal artery occlusion (CRAO) OD  - pt had acute painless vision loss OD upon waking 1.20.2021  - initially presented to Dr. 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, and pt is on Warfarin  - on initial exam, BCVA HM with retinal whitening and central cherry red spot  - pt presented to ED (01.20.21) for urgent MRI/MRA and stroke work up -- was discharged with Neurology f/u  - CT head, MRI brain, MRA head and neck done 1.21.21 in ED -- see reports below -- no significant acute changes from prior studies  - pt following with Neurology and Cardiology -- currently on Plavix and Warfarin  - today OCT shows interval progression of IRHM to inner retinal atrophy OD  - exam shows fine vessels on disc OD -- ?early NVD  - IOP 10 on brimonidine and Cosopt, but pt reports burning w/ Cosopt  - continue brimonidine BID OD, can decrease Cosopt to BID OD  - f/u 5 weeks, DFE, OCT, FA (optos, transit OD)  3,4. Hypertensive retinopathy OU  - discussed importance of tight BP control  - monitor  5. Pseudophakia OS  - s/p CE/IOL OS (R. Groat)  - IOL in excellent position  - pt reports "twitch" in vision OS since cataract surgery -- no pseudophakodonesis  - +PCO OS -- pt reports glare symptoms  - discussed YAG procedure, but will address dry eyes first  6. Mixed form age-related cataract OD  - The symptoms of cataract, surgical options, and treatments and risks were discussed with patient.  -  discussed diagnosis and progression  - likely visually significant, but pt is very reluctant to move forward with cataract surgery due to result of cataract surgery OS  - monitor for now  7. Dry eyes OS - recommend artificial tears and lubricating ointment as needed    Ophthalmic Meds Ordered this visit:  No orders of the defined types were  placed in this encounter.      Return in about 5 weeks (around 03/27/2020) for f/u CRAO OD, DFE, OCT.  There are no Patient Instructions on file for this visit.   Explained the diagnoses, plan, and follow up with the patient and they expressed understanding.  Patient expressed understanding of the importance of proper follow up care.   This document serves as a record of services personally performed by Gardiner Sleeper, MD, PhD. It was created on their behalf by Ernest Mallick, OA, an ophthalmic assistant. The creation of this record is the provider's dictation and/or activities during the visit.    Electronically signed by: Ernest Mallick, OA 04.21.2021 9:33 AM  Gardiner Sleeper, M.D., Ph.D. Diseases & Surgery of the Retina and [place] 02/21/2020   I have reviewed the above documentation for accuracy and completeness, and I agree with the above. Gardiner Sleeper, M.D., Ph.D. 02/21/20 9:37 AM   Abbreviations: M myopia (nearsighted); A astigmatism; H hyperopia (farsighted); P presbyopia; Mrx spectacle prescription;  CTL contact lenses; OD right eye; OS left eye; OU both eyes  XT exotropia; ET esotropia; PEK punctate epithelial keratitis; PEE punctate epithelial erosions; DES dry eye syndrome; MGD meibomian gland dysfunction; ATs artificial tears; PFAT's preservative free artificial tears; [place] nuclear sclerotic cataract; PSC posterior subcapsular cataract; ERM epi-retinal membrane; PVD posterior vitreous detachment; RD retinal detachment; DM diabetes mellitus; DR diabetic retinopathy; NPDR non-proliferative diabetic  retinopathy; PDR proliferative diabetic retinopathy; CSME clinically significant macular edema; DME diabetic macular edema; dbh dot blot hemorrhages; CWS cotton wool spot; POAG primary open angle glaucoma; C/D cup-to-disc ratio; HVF humphrey visual field; GVF goldmann visual field; OCT optical coherence tomography; IOP intraocular pressure; BRVO Branch retinal vein occlusion; CRVO central retinal vein occlusion; CRAO central retinal artery occlusion; BRAO branch retinal artery occlusion; RT retinal tear; SB scleral buckle; PPV pars plana vitrectomy; VH Vitreous hemorrhage; PRP panretinal laser photocoagulation; IVK intravitreal kenalog; VMT vitreomacular traction; MH Macular hole;  NVD neovascularization of the disc; NVE neovascularization elsewhere; AREDS age related eye disease study; ARMD age related macular degeneration; POAG primary open angle glaucoma; EBMD epithelial/anterior basement membrane dystrophy; ACIOL anterior chamber intraocular lens; IOL intraocular lens; PCIOL posterior chamber intraocular lens; Phaco/IOL phacoemulsification with intraocular lens placement; PRK photorefractive keratectomy; LASIK laser assisted in situ keratomileusis; HTN hypertension; DM diabetes mellitus; COPD chronic obstructive pulmonary disease  ===========================================  MRI Brain w/o contrast, MRA Head and Neck -- 01.21.2021  CLINICAL DATA:  Right eye vision loss  EXAM: MRI HEAD WITHOUT CONTRAST  MRA HEAD WITHOUT CONTRAST  MRA NECK WITHOUT AND WITH CONTRAST  TECHNIQUE: Multiplanar, multiecho pulse sequences of the brain and surrounding structures were obtained without intravenous contrast. Angiographic images of the Circle of Willis were obtained using MRA technique without intravenous contrast. Angiographic images of the neck were obtained using MRA technique without and with intravenous contrast. Carotid stenosis measurements (when applicable) are obtained utilizing NASCET  criteria, using the distal internal carotid diameter as the denominator.  CONTRAST:  3mL GADAVIST GADOBUTROL 1 MMOL/ML IV SOLN  COMPARISON:  MRI 2016  FINDINGS: MRI HEAD FINDINGS  Brain: There is no acute infarction or intracranial hemorrhage. There is no intracranial mass, mass effect, or edema. There is no hydrocephalus or extra-axial fluid collection.  There are chronic infarcts of the left occipital lobe, left occipitotemporal region, left greater than right cerebellum, right periatrial white matter, and basal ganglia. These are present on the prior study. Additional confluent areas of T2  hyperintensity in the supratentorial and pontine white matter are nonspecific but probably reflect moderate to advanced chronic microvascular ischemic changes. Prominence of the ventricles and sulci reflects mild generalized parenchymal volume loss. Foci of susceptibility in the right subinsular region and left cerebellum likely reflect chronic blood products.  Vascular: Major vessel flow voids at the skull base are preserved.  Skull and upper cervical spine: Normal marrow signal is preserved.  Sinuses/Orbits: Paranasal sinuses are aerated. Left lens replacement.  Other: Sella is unremarkable.  Mastoid air cells are clear.  MRA HEAD FINDINGS  Intracranial internal carotid arteries are patent. Middle and anterior cerebral arteries are patent. Intracranial vertebral arteries, basilar artery, posterior cerebral arteries are patent. Probable atherosclerotic irregularity and moderate stenosis of the distal left vertebral artery. Bilateral posterior communicating arteries are present. There is fetal origin of the right posterior cerebral artery. There is no aneurysm.  MRA NECK FINDINGS  There is suspected severe stenosis at the left subclavian artery origin. Common, internal, and external carotid arteries are patent. There is plaque at the common carotid bifurcations and  proximal internal carotids. Resulting less than 50% stenosis.  Codominant extracranial vertebral arteries are patent.  IMPRESSION: No acute infarction, hemorrhage, or mass. Stable chronic findings including microvascular ischemic changes and infarcts detailed above.  Plaque at the ICA origins without hemodynamically significant stenosis.  Focal moderate stenosis of the distal intracranial left vertebral artery.  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

## 2020-02-20 ENCOUNTER — Encounter (HOSPITAL_COMMUNITY): Payer: Self-pay | Admitting: Psychiatry

## 2020-02-20 ENCOUNTER — Other Ambulatory Visit: Payer: Self-pay

## 2020-02-20 ENCOUNTER — Telehealth (INDEPENDENT_AMBULATORY_CARE_PROVIDER_SITE_OTHER): Payer: Medicare Other | Admitting: Psychiatry

## 2020-02-20 VITALS — Wt 190.0 lb

## 2020-02-20 DIAGNOSIS — F411 Generalized anxiety disorder: Secondary | ICD-10-CM | POA: Diagnosis not present

## 2020-02-20 DIAGNOSIS — F331 Major depressive disorder, recurrent, moderate: Secondary | ICD-10-CM

## 2020-02-20 MED ORDER — DESVENLAFAXINE SUCCINATE ER 50 MG PO TB24: 50.0000 mg | ORAL_TABLET | Freq: Every day | ORAL | 2 refills | Status: DC

## 2020-02-20 MED ORDER — TRAZODONE HCL 100 MG PO TABS: ORAL_TABLET | ORAL | 2 refills | Status: DC

## 2020-02-20 MED ORDER — DIAZEPAM 2 MG PO TABS: 2.0000 mg | ORAL_TABLET | Freq: Every day | ORAL | 0 refills | Status: DC | PRN
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
## 2020-02-21 ENCOUNTER — Other Ambulatory Visit: Payer: Self-pay

## 2020-02-21 ENCOUNTER — Ambulatory Visit (INDEPENDENT_AMBULATORY_CARE_PROVIDER_SITE_OTHER): Payer: Medicare Other | Admitting: Ophthalmology

## 2020-02-21 ENCOUNTER — Encounter (INDEPENDENT_AMBULATORY_CARE_PROVIDER_SITE_OTHER): Payer: Self-pay | Admitting: Ophthalmology

## 2020-02-21 DIAGNOSIS — H04123 Dry eye syndrome of bilateral lacrimal glands: Secondary | ICD-10-CM

## 2020-02-21 DIAGNOSIS — H3411 Central retinal artery occlusion, right eye: Secondary | ICD-10-CM

## 2020-02-21 DIAGNOSIS — H3581 Retinal edema: Secondary | ICD-10-CM | POA: Diagnosis not present

## 2020-02-21 DIAGNOSIS — H35033 Hypertensive retinopathy, bilateral: Secondary | ICD-10-CM

## 2020-02-21 DIAGNOSIS — I10 Essential (primary) hypertension: Secondary | ICD-10-CM

## 2020-02-21 DIAGNOSIS — H25811 Combined forms of age-related cataract, right eye: Secondary | ICD-10-CM

## 2020-02-21 DIAGNOSIS — Z961 Presence of intraocular lens: Secondary | ICD-10-CM

## 2020-02-28 ENCOUNTER — Ambulatory Visit (INDEPENDENT_AMBULATORY_CARE_PROVIDER_SITE_OTHER): Payer: Medicare Other | Admitting: Pharmacist Clinician (PhC)/ Clinical Pharmacy Specialist

## 2020-02-28 ENCOUNTER — Other Ambulatory Visit: Payer: Self-pay

## 2020-02-28 DIAGNOSIS — Z7901 Long term (current) use of anticoagulants: Secondary | ICD-10-CM | POA: Diagnosis not present

## 2020-02-28 DIAGNOSIS — I480 Paroxysmal atrial fibrillation: Secondary | ICD-10-CM

## 2020-02-28 LAB — POCT INR: INR: 2.3 (ref 2.0–3.0)

## 2020-02-29 NOTE — Telephone Encounter (Signed)
No action needed

## 2020-03-04 ENCOUNTER — Telehealth: Payer: Self-pay

## 2020-03-04 NOTE — Telephone Encounter (Signed)
Pt called regarding the use of her vit d use, per note from pcp, she was to take the vitamins for four months and f/u with labs. Left message for pt to call bk to discuss coming in for nv for labs

## 2020-03-06 ENCOUNTER — Ambulatory Visit (HOSPITAL_BASED_OUTPATIENT_CLINIC_OR_DEPARTMENT_OTHER)
Admission: RE | Admit: 2020-03-06 | Discharge: 2020-03-06 | Disposition: A | Discharge status: Home / Self Care | Payer: Medicare Other | Source: Ambulatory Visit | Attending: Cardiovascular Disease | Admitting: Cardiovascular Disease

## 2020-03-06 ENCOUNTER — Other Ambulatory Visit: Payer: Self-pay

## 2020-03-06 ENCOUNTER — Ambulatory Visit (HOSPITAL_COMMUNITY)
Admission: RE | Admit: 2020-03-06 | Discharge: 2020-03-06 | Disposition: A | Discharge status: Home / Self Care | Payer: Medicare Other | Source: Ambulatory Visit | Attending: Cardiovascular Disease | Admitting: Cardiovascular Disease

## 2020-03-06 ENCOUNTER — Other Ambulatory Visit (HOSPITAL_COMMUNITY): Payer: Self-pay | Admitting: Cardiovascular Disease

## 2020-03-06 DIAGNOSIS — Z95828 Presence of other vascular implants and grafts: Secondary | ICD-10-CM

## 2020-03-06 DIAGNOSIS — I739 Peripheral vascular disease, unspecified: Secondary | ICD-10-CM | POA: Diagnosis present

## 2020-03-19 NOTE — Progress Notes (Signed)
[facility] Note  03/27/2020     CHIEF COMPLAINT Patient presents for Retina Follow Up   HISTORY OF PRESENT ILLNESS: Laurie Horton is a 74 y.o. female who presents to the clinic today for:   HPI    Retina Follow Up    Patient presents with  Other.  In right eye.  This started weeks ago.  Severity is moderate.  Duration of weeks.  Since onset it is stable.  I, the attending physician,  performed the HPI with the patient and updated documentation appropriately.          Comments    Pt states vision is about the same or maybe a little better in her right eye.  Pt denies eye pain or discomfort and denies any new or worsening floaters or fol OU.       Last edited by Bernarda Caffey, MD on 03/27/2020  8:56 AM. (History)    pt reports stable vision, but states she is getting a lot of headaches   Referring physician: Rutherford Guys, MD [address] [place],  [place] [address]  HISTORICAL INFORMATION:   Selected notes from the MEDICAL RECORD NUMBER Referred by Dr. Martinique DeMarco for concern of artery occlusion OD LEE: 01.20.21 (J. DeMarco) Ocular Hx- PMH-   CURRENT MEDICATIONS: Current Outpatient Medications (Ophthalmic Drugs)  Medication Sig  . brimonidine (ALPHAGAN) 0.2 % ophthalmic solution Place 1 drop into the right eye 2 (two) times daily.  . dorzolamide-timolol (COSOPT) 22.3-6.8 MG/ML ophthalmic solution Place 1 drop into the right eye 2 (two) times daily.   No current facility-administered medications for this visit. (Ophthalmic Drugs)   Current Outpatient Medications (Other)  Medication Sig  . carvedilol (COREG) 12.5 MG tablet Take 1 tablet (12.5 mg total) by mouth 2 (two) times daily.  . clopidogrel (PLAVIX) 75 MG tablet Take 1 tablet (75 mg total) by mouth daily.  Marland Kitchen desvenlafaxine (PRISTIQ) 50 MG 24 hr tablet Take 1 tablet (50 mg total) by mouth daily.  . diazepam (VALIUM) 2 MG tablet Take 1 tablet (2 mg total) by mouth daily as  needed for anxiety.  . diclofenac sodium (VOLTAREN) 1 % GEL APPLY 2 GRAMS TO BOTH HANDS 4 TIMES A DAY  . Eyelid Cleansers (OCUSOFT BABY EYELID & EYELASH EX) Apply topically.  . furosemide (LASIX) 40 MG tablet Take 1 tablet (40 mg total) by mouth daily.  Marland Kitchen gabapentin (NEURONTIN) 100 MG capsule Take 200 mg by mouth See admin instructions. Take 1 in am and 2 in the lunch and 1 at bedtime  . HYDROcodone-acetaminophen (NORCO/VICODIN) 5-325 MG tablet Take 1 tablet by mouth 3 (three) times daily.   Marland Kitchen ipratropium (ATROVENT) 0.02 % nebulizer solution USE 1 VIAL BY NEBULIZATION 4 (FOUR) TIMES DAILY.  . montelukast (SINGULAIR) 10 MG tablet TAKE 1 TABLET BY MOUTH EVERYDAY AT BEDTIME  . ondansetron (ZOFRAN-ODT) 8 MG disintegrating tablet TAKE 1 TABLET BY MOUTH EVERY 8 HOURS AS NEEDED NAUSEA OR VOMITING  . oxybutynin (DITROPAN-XL) 10 MG 24 hr tablet TAKE 1 TABLET BY MOUTH EVERY DAY (Patient taking differently: Take 10 mg by mouth daily. )  . OXYGEN Inhale 1.5-2 L into the lungs as needed (for shortness of breath).  . pantoprazole (PROTONIX) 40 MG tablet Take 1 tablet (40 mg total) by mouth daily.  . polyethylene glycol (MIRALAX / GLYCOLAX) packet Take 17 g by mouth every other day.   . rosuvastatin (CRESTOR) 5 MG tablet TAKE 1 TABLET BY MOUTH 2  TIMES A WEEK. SUNDAY AND WEDNESDAY  . spironolactone (ALDACTONE) 25 MG tablet TAKE 1 TABLET BY MOUTH EVERY DAY  . tiZANidine (ZANAFLEX) 2 MG tablet Take 2 mg by mouth at bedtime. Take 1/2 in am , 1/2 at lunch and 1 at night  . traZODone (DESYREL) 100 MG tablet Take one tab daily as needed for sleep  . Vitamin D, Ergocalciferol, (DRISDOL) 1.25 MG (50000 UNIT) CAPS capsule TAKE 1 CAPSULE (50,000 UNITS TOTAL) BY MOUTH EVERY 7 (SEVEN) DAYS.  Marland Kitchen warfarin (COUMADIN) 5 MG tablet TAKE 1/2 TO 1 TABLET BY MOUTH EVERY DAY OR AS DIRECTED BY COUMADIN CLINIC (Patient taking differently: Take 2.5-5 mg by mouth See admin instructions. Take 2.5 mg on Friday, take 5 mg on all other days.)    No current facility-administered medications for this visit. (Other)      REVIEW OF SYSTEMS: ROS    Positive for: Gastrointestinal, Musculoskeletal, Cardiovascular, Eyes, Respiratory   Negative for: Constitutional, Neurological, Skin, Genitourinary, HENT, Endocrine, Psychiatric, Allergic/Imm, Heme/Lymph   Last edited by Doneen Poisson on 03/27/2020  8:12 AM. (History)       ALLERGIES Allergies  Allergen Reactions  . Alprazolam Anaphylaxis and Other (See Comments)    REACTION: stops breathing  . Bee Venom Anaphylaxis  . Iodine Anaphylaxis, Swelling and Other (See Comments)    REACTION: swelling in throat  . Pseudoephedrine Hcl Er Shortness Of Breath  . Budesonide-Formoterol Fumarate Other (See Comments)    Blisters inside of mouth all over  . Crestor [Rosuvastatin Calcium] Other (See Comments)    Unable to walk  . Esomeprazole Magnesium Other (See Comments)    REACTION: "bouncing off walls"  . Flonase [Fluticasone Propionate] Other (See Comments)    NOSE BLEED  . Lamictal [Lamotrigine] Rash    Patient got rash, labored breathing, and diarrhea  . Loratadine Other (See Comments)    claritin D causes shaking  . Lotrimin [Clotrimazole] Other (See Comments)    Mouth blisters  . Lunesta [Eszopiclone] Other (See Comments)    REACTION: "slept for a week"  . Oxcarbazepine Other (See Comments)    Causes deep sleep and dizziness  . Statins Other (See Comments)    Can't walk, legs won't work   . Zolpidem Tartrate Other (See Comments)    REACTION: "slept for a week"  . Betadine [Povidone Iodine] Other (See Comments)    Breathing problems  . Bevespi Aerosphere [Glycopyrrolate-Formoterol] Other (See Comments)    Pt believes this caused mouth sores and thrush   . Clarithromycin Other (See Comments)    All "mycins", Puts into "a" fib, Will take if has to for severe sinus infection  . Effexor [Venlafaxine] Nausea And Vomiting and Other (See Comments)    cramps  . Lexapro  [Escitalopram Oxalate] Other (See Comments)    hallucinations  . Aciphex [Rabeprazole Sodium] Rash  . Alendronate Sodium Other (See Comments)    "caused stomach problems for 3 days"  . Avelox [Moxifloxacin Hcl In Nacl] Other (See Comments)    Stomach cramps.   Darlin Coco [Valdecoxib] Rash  . Ceclor [Cefaclor] Rash  . Cephalexin Rash and Other (See Comments)    Pt states that she is possibly allergic to this - had a reaction to Cefaclor in the past and she does not want to these class drugs. Added per patient request.  . Covera-Hs [Verapamil Hcl] Palpitations  . Dicyclomine Hcl Rash  . Other Other (See Comments)    Glue from ekg/heart monitor leads --rash, Any MYCINS  .  Tessalon Perles Rash    PAST MEDICAL HISTORY Past Medical History:  Diagnosis Date  . Cataract    OD  . CHF (congestive heart failure) ([place]) 03/02/2017   EF 25-30% 2018  . Complication of anesthesia    various issues with oxygen  saturations post op  . COPD (chronic obstructive pulmonary disease) ([place])   . Depression   . Diverticulitis   . DVT (deep venous thrombosis) ([place])   . Fibromyalgia   . GERD (gastroesophageal reflux disease)   . Hiatal hernia   . History of deviated nasal septum    left- side  . HTN (hypertension)   . Hyperlipidemia   . Hypertensive retinopathy    OU  . Obesity   . On supplemental oxygen therapy    concentrator at night @ 1.5 l/m or when sleeps. O2 Sat niormally 87.  . Osteoporosis   . PAT (paroxysmal atrial tachycardia) ([place])   . PFO (patent foramen ovale)   . PULMONARY NODULE, LEFT LOWER LOBE 10/14/2009   55mm LLL nodule dec 2010. Stable and 16mm in Oct 2012. No further fu  . PVD (peripheral vascular disease) with claudication ([place]) 12/2017  . Right middle lobe pneumonia 07/24/2011   First noted at admit 07/10/11. Persists on cxr 07/22/11. Cleared on CT 08/24/11. No further followup  . Stroke ([place])   . TOBACCO ABUSE 06/04/2009   Past Surgical History:  Procedure Laterality Date   . CARDIOVASCULAR STRESS TEST  12/26/2004   EF 74%. NO EVIDENCE OF ISCHEMIA  . CATARACT EXTRACTION Left    Dr. Elliot Dally  . ESOPHAGOGASTRODUODENOSCOPY (EGD) WITH PROPOFOL N/A 04/15/2015   Procedure: ESOPHAGOGASTRODUODENOSCOPY (EGD) WITH PROPOFOL;  Surgeon: Laurence Spates, MD;  Location: [facility];  Service: Endoscopy;  Laterality: N/A;  . EYE SURGERY Left    Cat Sx  . KNEE ARTHROSCOPY  2000   left  . LAPAROSCOPIC CHOLECYSTECTOMY  04-16-2010   [facility]  . LOWER EXTREMITY ANGIOGRAPHY N/A 09/09/2017   Procedure: Lower Extremity Angiography;  Surgeon: Lorretta Harp, MD;  Location: [facility];  Service: Cardiovascular;  Laterality: N/A;  . LOWER EXTREMITY INTERVENTION Left 01/17/2018   Procedure: LOWER EXTREMITY INTERVENTION;  Surgeon: Lorretta Harp, MD;  Location: [facility];  Service: Cardiovascular;  Laterality: Left;  . MOUTH SURGERY     03-26-15 multiple extractions stitches remains  . PERIPHERAL VASCULAR INTERVENTION Left 01/17/2018   Procedure: PERIPHERAL VASCULAR INTERVENTION;  Surgeon: Lorretta Harp, MD;  Location: [facility];  Service: Cardiovascular;  Laterality: Left;  COMMON ILIAC  . RIGHT/LEFT HEART CATH AND CORONARY ANGIOGRAPHY N/A 03/04/2017   Procedure: Right/Left Heart Cath and Coronary Angiography;  Surgeon: Peter M Martinique, MD;  Location: [facility];  Service: Cardiovascular;  Laterality: N/A;  . TOTAL ABDOMINAL HYSTERECTOMY     post op needed oxygen was told "she gave them a scare"  . TUBAL LIGATION    . US ECHOCARDIOGRAPHY  11/20/2009   EF 55-60%    FAMILY HISTORY Family History  Problem Relation Age of Onset  . Dementia Mother   . Diabetes Mother   . Alzheimer's disease Mother   . Heart attack Brother 83  . Heart attack Father   . Schizophrenia Sister   . Diabetes Sister   . Tremor Sister     SOCIAL HISTORY Social History   Tobacco Use  . Smoking status: Former Smoker    Packs/day: 3.00    Years: 30.00    Pack years:  90.00    Types: Cigarettes    Quit date: 06/02/2010    Years since quitting: 9.8  . Smokeless tobacco: Never Used  . Tobacco comment: QUIT IN 2011  Substance Use Topics  . Alcohol use: No    Alcohol/week: 0.0 standard drinks  . Drug use: No         OPHTHALMIC EXAM:  Base Eye Exam    Visual Acuity (Snellen - Linear)      Right Left   Dist [place] CF @ face 20/40 -2   Dist ph [place] NI NI       Tonometry (Tonopen, 8:16 AM)      Right Left   Pressure 11 12       Pupils      Dark Light Shape React APD   Right 3 2 Round Brisk 0   Left 2 1 Round Brisk 0       Visual Fields      Left Right    Full    Restrictions  Total superior temporal, inferior temporal, superior nasal deficiencies       Extraocular Movement      Right Left    Full Full       Neuro/Psych    Oriented x3: Yes   Mood/Affect: Normal       Dilation    Both eyes: 1.0% Mydriacyl, 2.5% Phenylephrine @ 8:17 AM        Slit Lamp and Fundus Exam    Slit Lamp Exam      Right Left   Lids/Lashes Dermatochalasis - upper lid, Meibomian gland dysfunction Dermatochalasis - upper lid, Meibomian gland dysfunction   Conjunctiva/Sclera Nasal Pinguecula; otherwise white and quiet Nasal Pinguecula; otherwise white and quiet   Cornea Arcus, trace Punctate epithelial erosions Arcus, 1+ Punctate epithelial erosions, well healed temporal cataract wounds   Anterior Chamber deep, narrow temporal angle, no NVI deep, narrow temporal angle   Iris Round and dilated, No NVI, mild anterior bowing Round and dilated, No NVI   Lens 3+ Nuclear sclerosis with brunescence, 3+ Cortical cataract PC IOL in good position, 1-2+ Posterior capsular opacification   Vitreous Vitreous syneresis Vitreous syneresis       Fundus Exam      Right Left   Disc Pink and Sharp, +fine vascular loops/collateral vessels Pink and Sharp   C/D Ratio 0.2 0.1   Macula Flat, blunted foveal reflex; atrophy  Flat, good foveal reflex, mild Retinal pigment epithelial  mottling, No heme or edema   Vessels Vascular attenuation, mild Tortuousity Vascular attenuation   Periphery Attached, mild reticular degeneration, No heme  Attached, mild reticular degeneration, No heme           IMAGING AND PROCEDURES  Imaging and Procedures for @TODAY @  OCT, Retina - OU - Both Eyes       Right Eye Quality was good. Central Foveal Thickness: 235. Progression has been stable. Findings include no IRF, no SRF, intraretinal hyper-reflective material, abnormal foveal contour, inner retinal atrophy (Stable from prior).   Left Eye Quality was good. Central Foveal Thickness: 283. Progression has been stable. Findings include normal foveal contour, no IRF, no SRF.   Notes *Images captured and stored on drive  Diagnosis / Impression:  OD: +atrophy; no IRF/SRF; stable from prior OS: NFP, no IRF/SRF  Clinical management:  See below  Abbreviations: NFP - Normal foveal profile. CME - cystoid macular edema. PED - pigment epithelial detachment. IRF - intraretinal fluid. SRF - subretinal fluid. EZ -  ellipsoid zone. ERM - epiretinal membrane. ORA - outer retinal atrophy. ORT - outer retinal tubulation. SRHM - subretinal hyper-reflective material        Fluorescein Angiography Optos (Transit OD)       Right Eye   Progression has been stable. Early phase findings include delayed filling, vascular perfusion defect (First image not captured until 36 seconds, delayed venous return). Mid/Late phase findings include vascular perfusion defect (No leakage from disc, no active NVD).   Left Eye   Progression has no prior data. Early phase findings include normal observations. Mid/Late phase findings include normal observations.   Notes **Images stored on drive**  Impression: OD: delayed filling time and vascular perfusion defect consistent with CRAO; no leakage; no NVD OS: normal study                  ASSESSMENT/PLAN:    ICD-10-CM   1. Central retinal  artery occlusion of right eye  H34.11   2. Retinal edema  H35.81 OCT, Retina - OU - Both Eyes  3. Hypertensive retinopathy of both eyes  H35.033 Fluorescein Angiography Optos (Transit OD)  4. Essential hypertension  I10   5. Pseudophakia  Z96.1   6. Combined forms of age-related cataract of right eye  H25.811   7. Dry eyes  H04.123     1,2. Central retinal artery occlusion (CRAO) OD  - pt had acute painless vision loss OD upon waking 1.20.2021  - initially presented to Dr. 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, and pt is on Warfarin  - on initial exam, BCVA HM with retinal whitening and central cherry red spot  - pt presented to ED (01.20.21) for urgent MRI/MRA and stroke work up -- was discharged with Neurology f/u  - CT head, MRI brain, MRA head and neck done 1.21.21 in ED -- see reports below -- no significant acute changes from prior studies  - pt following with Neurology and Cardiology -- currently on Plavix and Warfarin  - today OCT is stable from prior  - exam shows fine vessels on disc OD -- vascular loops/collaterals  - FA 5.26.21 shows no leakage and no NVD  - IOP 11 on brimonidine and Cosopt, but pt reports burning w/ Cosopt  - continue brimonidine BID OD  - continue Cosopt to BID OD  - f/u 2-3 months, DFE, OCT  3,4. Hypertensive retinopathy OU  - discussed importance of tight BP control  - monitor  5. Pseudophakia OS  - s/p CE/IOL OS (R. Groat)  - IOL in excellent position  - pt reports "twitch" in vision OS since cataract surgery -- no pseudophakodonesis  - +PCO OS -- pt reports glare symptoms  - discussed YAG procedure, but will address dry eyes first  6. Mixed form age-related cataract OD  - The symptoms of cataract, surgical options, and treatments and risks were discussed with patient.  - discussed diagnosis and progression  - likely visually  significant, but pt is very reluctant to move forward with cataract surgery due to result of cataract surgery OS  - monitor for now  7. Dry eyes OS  - recommend artificial tears and lubricating ointment as needed    Ophthalmic Meds Ordered this visit:  No orders of the defined types were placed in this encounter.      Return for f/u 2-3 months, CRAO OD, DFE, OCT.  There are no Patient Instructions  on file for this visit.   Explained the diagnoses, plan, and follow up with the patient and they expressed understanding.  Patient expressed understanding of the importance of proper follow up care.   Gardiner Sleeper, M.D., Ph.D. Diseases & Surgery of the Retina and [place] 03/27/2020   I have reviewed the above documentation for accuracy and completeness, and I agree with the above. Gardiner Sleeper, M.D., Ph.D. 03/31/20 11:05 AM   Abbreviations: M myopia (nearsighted); A astigmatism; H hyperopia (farsighted); P presbyopia; Mrx spectacle prescription;  CTL contact lenses; OD right eye; OS left eye; OU both eyes  XT exotropia; ET esotropia; PEK punctate epithelial keratitis; PEE punctate epithelial erosions; DES dry eye syndrome; MGD meibomian gland dysfunction; ATs artificial tears; PFAT's preservative free artificial tears; [place] nuclear sclerotic cataract; PSC posterior subcapsular cataract; ERM epi-retinal membrane; PVD posterior vitreous detachment; RD retinal detachment; DM diabetes mellitus; DR diabetic retinopathy; NPDR non-proliferative diabetic retinopathy; PDR proliferative diabetic retinopathy; CSME clinically significant macular edema; DME diabetic macular edema; dbh dot blot hemorrhages; CWS cotton wool spot; POAG primary open angle glaucoma; C/D cup-to-disc ratio; HVF humphrey visual field; GVF goldmann visual field; OCT optical coherence tomography; IOP intraocular pressure; BRVO Branch retinal vein occlusion; CRVO central retinal vein  occlusion; CRAO central retinal artery occlusion; BRAO branch retinal artery occlusion; RT retinal tear; SB scleral buckle; PPV pars plana vitrectomy; VH Vitreous hemorrhage; PRP panretinal laser photocoagulation; IVK intravitreal kenalog; VMT vitreomacular traction; MH Macular hole;  NVD neovascularization of the disc; NVE neovascularization elsewhere; AREDS age related eye disease study; ARMD age related macular degeneration; POAG primary open angle glaucoma; EBMD epithelial/anterior basement membrane dystrophy; ACIOL anterior chamber intraocular lens; IOL intraocular lens; PCIOL posterior chamber intraocular lens; Phaco/IOL phacoemulsification with intraocular lens placement; PRK photorefractive keratectomy; LASIK laser assisted in situ keratomileusis; HTN hypertension; DM diabetes mellitus; COPD chronic obstructive pulmonary disease  ===========================================  MRI Brain w/o contrast, MRA Head and Neck -- 01.21.2021  CLINICAL DATA:  Right eye vision loss  EXAM: MRI HEAD WITHOUT CONTRAST  MRA HEAD WITHOUT CONTRAST  MRA NECK WITHOUT AND WITH CONTRAST  TECHNIQUE: Multiplanar, multiecho pulse sequences of the brain and surrounding structures were obtained without intravenous contrast. Angiographic images of the Circle of Willis were obtained using MRA technique without intravenous contrast. Angiographic images of the neck were obtained using MRA technique without and with intravenous contrast. Carotid stenosis measurements (when applicable) are obtained utilizing NASCET criteria, using the distal internal carotid diameter as the denominator.  CONTRAST:  57mL GADAVIST GADOBUTROL 1 MMOL/ML IV SOLN  COMPARISON:  MRI 2016  FINDINGS: MRI HEAD FINDINGS  Brain: There is no acute infarction or intracranial hemorrhage. There is no intracranial mass, mass effect, or edema. There is no hydrocephalus or extra-axial fluid collection.  There are chronic infarcts of the  left occipital lobe, left occipitotemporal region, left greater than right cerebellum, right periatrial white matter, and basal ganglia. These are present on the prior study. Additional confluent areas of T2 hyperintensity in the supratentorial and pontine white matter are nonspecific but probably reflect moderate to advanced chronic microvascular ischemic changes. Prominence of the ventricles and sulci reflects mild generalized parenchymal volume loss. Foci of susceptibility in the right subinsular region and left cerebellum likely reflect chronic blood products.  Vascular: Major vessel flow voids at the skull base are preserved.  Skull and upper cervical spine: Normal marrow signal is preserved.  Sinuses/Orbits: Paranasal sinuses are aerated. Left lens replacement.  Other: Sella is  unremarkable.  Mastoid air cells are clear.  MRA HEAD FINDINGS  Intracranial internal carotid arteries are patent. Middle and anterior cerebral arteries are patent. Intracranial vertebral arteries, basilar artery, posterior cerebral arteries are patent. Probable atherosclerotic irregularity and moderate stenosis of the distal left vertebral artery. Bilateral posterior communicating arteries are present. There is fetal origin of the right posterior cerebral artery. There is no aneurysm.  MRA NECK FINDINGS  There is suspected severe stenosis at the left subclavian artery origin. Common, internal, and external carotid arteries are patent. There is plaque at the common carotid bifurcations and proximal internal carotids. Resulting less than 50% stenosis.  Codominant extracranial vertebral arteries are patent.  IMPRESSION: No acute infarction, hemorrhage, or mass. Stable chronic findings including microvascular ischemic changes and infarcts detailed above.  Plaque at the ICA origins without hemodynamically significant stenosis.  Focal moderate stenosis of the distal intracranial left  vertebral artery.  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

## 2020-03-27 ENCOUNTER — Other Ambulatory Visit: Payer: Self-pay

## 2020-03-27 ENCOUNTER — Encounter (INDEPENDENT_AMBULATORY_CARE_PROVIDER_SITE_OTHER): Payer: Self-pay | Admitting: Ophthalmology

## 2020-03-27 ENCOUNTER — Ambulatory Visit (INDEPENDENT_AMBULATORY_CARE_PROVIDER_SITE_OTHER): Payer: Medicare Other | Admitting: Ophthalmology

## 2020-03-27 DIAGNOSIS — H35033 Hypertensive retinopathy, bilateral: Secondary | ICD-10-CM | POA: Diagnosis not present

## 2020-03-27 DIAGNOSIS — H3411 Central retinal artery occlusion, right eye: Secondary | ICD-10-CM | POA: Diagnosis not present

## 2020-03-27 DIAGNOSIS — I10 Essential (primary) hypertension: Secondary | ICD-10-CM

## 2020-03-27 DIAGNOSIS — H3581 Retinal edema: Secondary | ICD-10-CM | POA: Diagnosis not present

## 2020-03-27 DIAGNOSIS — H25811 Combined forms of age-related cataract, right eye: Secondary | ICD-10-CM

## 2020-03-27 DIAGNOSIS — H04123 Dry eye syndrome of bilateral lacrimal glands: Secondary | ICD-10-CM

## 2020-03-27 DIAGNOSIS — Z961 Presence of intraocular lens: Secondary | ICD-10-CM

## 2020-03-29 ENCOUNTER — Other Ambulatory Visit: Payer: Self-pay

## 2020-03-29 ENCOUNTER — Ambulatory Visit (INDEPENDENT_AMBULATORY_CARE_PROVIDER_SITE_OTHER): Payer: Medicare Other | Admitting: Pharmacist

## 2020-03-29 DIAGNOSIS — I480 Paroxysmal atrial fibrillation: Secondary | ICD-10-CM | POA: Diagnosis not present

## 2020-03-29 DIAGNOSIS — Z7901 Long term (current) use of anticoagulants: Secondary | ICD-10-CM

## 2020-03-29 LAB — POCT INR: INR: 2.7 (ref 2.0–3.0)

## 2020-03-29 NOTE — Patient Instructions (Signed)
Continue taking 1/2 tablet daily except 1 tablet each Monday and Friday.  Repeat INR in 4 weeks  Weight 190.2lb

## 2020-04-15 ENCOUNTER — Ambulatory Visit: Payer: Medicare Other | Admitting: Neurology

## 2020-04-25 ENCOUNTER — Encounter: Payer: Medicare Other | Admitting: Family Medicine

## 2020-04-26 ENCOUNTER — Other Ambulatory Visit: Payer: Self-pay

## 2020-04-26 ENCOUNTER — Ambulatory Visit (INDEPENDENT_AMBULATORY_CARE_PROVIDER_SITE_OTHER): Payer: Medicare Other | Admitting: Pharmacist Clinician (PhC)/ Clinical Pharmacy Specialist

## 2020-04-26 DIAGNOSIS — I480 Paroxysmal atrial fibrillation: Secondary | ICD-10-CM

## 2020-04-26 DIAGNOSIS — Z7901 Long term (current) use of anticoagulants: Secondary | ICD-10-CM | POA: Diagnosis not present

## 2020-04-26 LAB — POCT INR: INR: 2.2 (ref 2.0–3.0)

## 2020-05-01 ENCOUNTER — Encounter: Payer: Self-pay | Admitting: Neurology

## 2020-05-01 ENCOUNTER — Ambulatory Visit (INDEPENDENT_AMBULATORY_CARE_PROVIDER_SITE_OTHER): Payer: Medicare Other | Admitting: Neurology

## 2020-05-01 ENCOUNTER — Other Ambulatory Visit: Payer: Self-pay

## 2020-05-01 VITALS — BP 150/70 | HR 70 | Ht 64.0 in | Wt 192.0 lb

## 2020-05-01 DIAGNOSIS — G3184 Mild cognitive impairment, so stated: Secondary | ICD-10-CM

## 2020-05-01 MED ORDER — PREVAGEN 10 MG PO CAPS: 10.0000 mg | ORAL_CAPSULE | Freq: Every morning | ORAL | 0 refills | Status: DC

## 2020-05-01 NOTE — Patient Instructions (Signed)
I had a long discussion the patient regarding her recent episode of right eye vision loss due to central retinal artery occlusion as well as memory and cognitive difficulties due to mild cognitive impairment and answered questions.  Reviewed results of recent  memory panel labs and EEG.  I advised her to continue warfarin for stroke prevention given history of atrial fibrillation and maintain aggressive risk factor modification with strict control of hypertension with blood pressure goal below 130/90, lipids with LDL cholesterol goal below 70 mg percent and diabetes with hemoglobin A1c goal below 6.5%.  She is also encouraged to eat a healthy diet and to be active and lose weight.  I also encouraged her to increase participation in cognitively challenging activities like solving crossword puzzles, playing bridge and sodoku.  Start prevagen 10 mg daily for memory loss.We also discussed memory compensation strategies.  Return for follow-up in 6 months or call earlier if necessary. Memory Compensation Strategies  1. Use "WARM" strategy.  W= write it down  A= associate it  R= repeat it  M= make a mental note  2.   You can keep a Social worker.  Use a 3-ring notebook with sections for the following: calendar, important names and phone numbers,  medications, doctors' names/phone numbers, lists/reminders, and a section to journal what you did  each day.   3.    Use a calendar to write appointments down.  4.    Write yourself a schedule for the day.  This can be placed on the calendar or in a separate section of the Memory Notebook.  Keeping a  regular schedule can help memory.  5.    Use medication organizer with sections for each day or morning/evening pills.  You may need help loading it  6.    Keep a basket, or pegboard by the door.  Place items that you need to take out with you in the basket or on the pegboard.  You may also want to  include a message board for reminders.  7.    Use sticky  notes.  Place sticky notes with reminders in a place where the task is performed.  For example: " turn off the  stove" placed by the stove, "lock the door" placed on the door at eye level, " take your medications" on  the bathroom mirror or by the place where you normally take your medications.  8.    Use alarms/timers.  Use while cooking to remind yourself to check on food or as a reminder to take your medicine, or as a  reminder to make a call, or as a reminder to perform another task, etc.

## 2020-05-01 NOTE — Progress Notes (Signed)
[facility] [address] [place]. [place] [address] [phone]       OFFICE FOLLOW UP VISIT NOTE  [name] Date of Birth:  January 12, 1946 Medical Record Number:  [id number]   Referring MD: Laurie Horton  Reason for Referral: Right eye vision loss and memory loss HPI: Initial consult 12/25/2019 : Laurie Horton is a 74 year old pleasant Caucasian lady seen today for initial office consultation visit.  History is obtained from the patient, review of electronic medical records and I personally reviewed imaging films in PACS.  Patient states she woke up on 11/21/2019 with sudden onset of painless right eye vision loss.  She had no problems with vision in the left eye.  She denied any accompanying headache or burning discomfort in the eye.  She was seen by ophthalmologist Dr. Parke Horton who referred her to Laurie Horton who diagnosed central retinal artery occlusion.  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  MRI of the brain showed no large vessel stenosis and only mild atheromatous irregularities and moderate stenosis of left distal vertebral artery.  MRA of the neck showed severe stenosis of left subclavian artery and 50% stenosis of right internal carotid artery.  Patient has history of chronic A. fib and is on anticoagulation with warfarin.  She had a carotid ultrasound on 08/07/2019 which showed 40 to 50% left ICA and 1 to 39% right ICA stenosis.  LDL cholesterol on 11/13/2019 was 99 mg percent and hemoglobin A1c on 03/14/2019 was 6.3.  Patient states right eye vision has not improved.  She has had no further stroke or TIA symptoms.  She however states she has noticed memory difficulties which are ongoing for about a year or so.  These are mostly involving short-term memory.  She has 2 take  notes otherwise she forgets.  She is still independent in managing most of her affairs however she is finding of memory difficulties and knowing.  She has no family stable dementia.  She has no prior history of significant head injury with loss of consciousness, seizures or prior known strokes or neurological problems. Update 05/01/2020 ; She returns for follow-up after last visit 4 months ago.  She states that she has had no further recurrent episodes of TIA or stroke.  She is tolerating warfarin and Plavix well without bruising or bleeding.  She continues to have short-term memory difficulties and has trouble at times remembering her medicines.  She initially started doing compensation strategies like keeping a journal but after a month or so realized she has trouble writing as it hurts her eyes and stopped doing so.  She has however been doing brain games online and finds them useful.  She underwent EEG on 01/03/2020 which showed moderate generalized slowing without epileptiform activity.  Lab work for reversible causes of memory loss on 12/25/2019 was all normal.  She has recently started using fish oil for her eyes.  She has no new complaints.  She feels her cognitive difficulties are unchanged ROS:   14 system review of systems is positive for vision loss, memory loss, cognitive difficulties, bruising and all other systems negative  PMH:  Past Medical History:  Diagnosis Date  . Cataract    OD  . CHF (congestive heart failure) ([place]) 03/02/2017   EF 25-30% 2018  . Complication of  anesthesia    various issues with oxygen  saturations post op  . COPD (chronic obstructive pulmonary disease) ([place])   . Depression   . Diverticulitis   . DVT (deep venous thrombosis) ([place])   . Fibromyalgia   . GERD (gastroesophageal reflux disease)   . Hiatal hernia   . History of deviated nasal septum    left- side  . HTN (hypertension)   . Hyperlipidemia   . Hypertensive retinopathy    OU  . Obesity   . On  supplemental oxygen therapy    concentrator at night @ 1.5 l/m or when sleeps. O2 Sat niormally 87.  . Osteoporosis   . PAT (paroxysmal atrial tachycardia) ([place])   . PFO (patent foramen ovale)   . PULMONARY NODULE, LEFT LOWER LOBE 10/14/2009   71mm LLL nodule dec 2010. Stable and 67mm in Oct 2012. No further fu  . PVD (peripheral vascular disease) with claudication ([place]) 12/2017  . Right middle lobe pneumonia 07/24/2011   First noted at admit 07/10/11. Persists on cxr 07/22/11. Cleared on CT 08/24/11. No further followup  . Stroke ([place])   . TOBACCO ABUSE 06/04/2009    Social History:  Social History   Socioeconomic History  . Marital status: Divorced    Spouse name: Not on file  . Number of children: 2  . Years of education: Not on file  . Highest education level: Not on file  Occupational History  . Occupation: disability  Tobacco Use  . Smoking status: Former Smoker    Packs/day: 3.00    Years: 30.00    Pack years: 90.00    Types: Cigarettes    Quit date: 06/02/2010    Years since quitting: 9.9  . Smokeless tobacco: Never Used  . Tobacco comment: QUIT IN 2011  Vaping Use  . Vaping Use: Never used  Substance and Sexual Activity  . Alcohol use: No    Alcohol/week: 0.0 standard drinks  . Drug use: No  . Sexual activity: Yes    Partners: Male    Birth control/protection: Post-menopausal, Surgical  Other Topics Concern  . Not on file  Social History Narrative   Marital status: divorced in 1978 after ten years; dating casually in 2019.      Children: 3 biological children; 3 court appointed children; 6 grandchildren; 5 gg      Lives: alone; children in [place] and [place].      Employment: retired age 31; disability for COPD, CVA at age 54.      Tobacco: former smoker; quit smoking 2011.      Alcohol: none      Exercise: walks dog four times per day; goes to pool three times per week.      ADLs: drives; no assistant devices; does have a walker.  Cleaning is limited in  2018.  Daughter helps with cleaning.  Does own grocery shopping.      Advanced Directives: YES; DNR/DNI; HCPOA: Rolan Lipa Martin/daughter youngest.  Blind.               Social Determinants of Health   Financial Resource Strain:   . Difficulty of Paying Living Expenses:   Food Insecurity:   . Worried About Charity fundraiser in the Last Year:   . Arboriculturist in the Last Year:   Transportation Needs:   . Film/video editor (Medical):   Marland Kitchen Lack of Transportation (Non-Medical):   Physical Activity:   . Days of Exercise per Week:   .  Minutes of Exercise per Session:   Stress:   . Feeling of Stress :   Social Connections:   . Frequency of Communication with Friends and Family:   . Frequency of Social Gatherings with Friends and Family:   . Attends Religious Services:   . Active Member of Clubs or Organizations:   . Attends Archivist Meetings:   Marland Kitchen Marital Status:   Intimate Partner Violence:   . Fear of Current or Ex-Partner:   . Emotionally Abused:   Marland Kitchen Physically Abused:   . Sexually Abused:     Medications:   Current Outpatient Medications on File Prior to Visit  Medication Sig Dispense Refill  . brimonidine (ALPHAGAN) 0.2 % ophthalmic solution Place 1 drop into the right eye 2 (two) times daily. 15 mL 6  . budesonide (PULMICORT) 0.25 MG/2ML nebulizer solution Inhale into the lungs.    . carvedilol (COREG) 12.5 MG tablet Take 1 tablet (12.5 mg total) by mouth 2 (two) times daily. 60 tablet 11  . clopidogrel (PLAVIX) 75 MG tablet Take 1 tablet (75 mg total) by mouth daily. 30 tablet 5  . cyclobenzaprine (FLEXERIL) 10 MG tablet Take by mouth.    . desvenlafaxine (PRISTIQ) 50 MG 24 hr tablet Take 1 tablet (50 mg total) by mouth daily. 30 tablet 2  . diazepam (VALIUM) 2 MG tablet Take 1 tablet (2 mg total) by mouth daily as needed for anxiety. 10 tablet 0  . diclofenac sodium (VOLTAREN) 1 % GEL APPLY 2 GRAMS TO BOTH HANDS 4 TIMES A DAY  5  .  dorzolamide-timolol (COSOPT) 22.3-6.8 MG/ML ophthalmic solution Place 1 drop into the right eye 2 (two) times daily. 10 mL 6  . Eyelid Cleansers (OCUSOFT BABY EYELID & EYELASH EX) Apply topically.    . furosemide (LASIX) 40 MG tablet Take 1 tablet (40 mg total) by mouth daily. 90 tablet 3  . gabapentin (NEURONTIN) 100 MG capsule Take 200 mg by mouth See admin instructions. Take 1 in am and 2 in the lunch and 1 at bedtime    . HYDROcodone-acetaminophen (NORCO/VICODIN) 5-325 MG tablet Take 1 tablet by mouth 3 (three) times daily.     Marland Kitchen ipratropium (ATROVENT) 0.02 % nebulizer solution USE 1 VIAL BY NEBULIZATION 4 (FOUR) TIMES DAILY. 75 mL 3  . LIDODERM 5 % SMARTSIG:Topical    . montelukast (SINGULAIR) 10 MG tablet TAKE 1 TABLET BY MOUTH EVERYDAY AT BEDTIME 30 tablet 5  . NORVASC 5 MG tablet Take by mouth.    . ondansetron (ZOFRAN-ODT) 8 MG disintegrating tablet TAKE 1 TABLET BY MOUTH EVERY 8 HOURS AS NEEDED NAUSEA OR VOMITING 20 tablet 0  . oxybutynin (DITROPAN) 5 MG tablet Take by mouth.    . oxybutynin (DITROPAN-XL) 10 MG 24 hr tablet TAKE 1 TABLET BY MOUTH EVERY DAY (Patient taking differently: Take 10 mg by mouth daily. ) 90 tablet 1  . OXYGEN Inhale 1.5-2 L into the lungs as needed (for shortness of breath).    . pantoprazole (PROTONIX) 40 MG tablet Take 1 tablet (40 mg total) by mouth daily. 90 tablet 1  . Polyethyl Glycol-Propyl Glycol (SYSTANE FREE OP)     . polyethylene glycol (MIRALAX / GLYCOLAX) packet Take 17 g by mouth daily as needed for mild constipation.     . rosuvastatin (CRESTOR) 5 MG tablet TAKE 1 TABLET BY MOUTH 2 TIMES A WEEK. SUNDAY AND WEDNESDAY 8 tablet 8  . spironolactone (ALDACTONE) 25 MG tablet TAKE 1 TABLET BY MOUTH EVERY  DAY 90 tablet 3  . tiZANidine (ZANAFLEX) 2 MG tablet Take 2 mg by mouth at bedtime. Take one in noon and 1 bedtime    . TOPROL XL 25 MG 24 hr tablet Take by mouth.    . traZODone (DESYREL) 100 MG tablet Take one tab daily as needed for sleep 30 tablet 2   . triamcinolone cream (KENALOG) 0.1 % SMARTSIG:1 Sparingly Topical Twice Daily PRN    . VALIUM 5 MG tablet Take by mouth every 6 (six) hours as needed.     . warfarin (COUMADIN) 5 MG tablet TAKE 1/2 TO 1 TABLET BY MOUTH EVERY DAY OR AS DIRECTED BY COUMADIN CLINIC (Patient taking differently: Take 2.5-5 mg by mouth See admin instructions. Take 2.5 mg on Friday, take 5 mg on all other days.) 30 tablet 5   No current facility-administered medications on file prior to visit.    Allergies:   Allergies  Allergen Reactions  . Alprazolam Anaphylaxis and Other (See Comments)    REACTION: stops breathing  . Bee Venom Anaphylaxis  . Iodine Anaphylaxis, Swelling and Other (See Comments)    REACTION: swelling in throat  . Pseudoephedrine Hcl Er Shortness Of Breath  . Budesonide-Formoterol Fumarate Other (See Comments)    Blisters inside of mouth all over  . Crestor [Rosuvastatin Calcium] Other (See Comments)    Unable to walk  . Esomeprazole Magnesium Other (See Comments)    REACTION: "bouncing off walls"  . Flonase [Fluticasone Propionate] Other (See Comments)    NOSE BLEED  . Lamictal [Lamotrigine] Rash    Patient got rash, labored breathing, and diarrhea  . Loratadine Other (See Comments)    claritin D causes shaking  . Lotrimin [Clotrimazole] Other (See Comments)    Mouth blisters  . Lunesta [Eszopiclone] Other (See Comments)    REACTION: "slept for a week"  . Oxcarbazepine Other (See Comments)    Causes deep sleep and dizziness  . Statins Other (See Comments)    Can't walk, legs won't work   . Zolpidem Tartrate Other (See Comments)    REACTION: "slept for a week"  . Betadine [Povidone Iodine] Other (See Comments)    Breathing problems  . Bevespi Aerosphere [Glycopyrrolate-Formoterol] Other (See Comments)    Pt believes this caused mouth sores and thrush   . Clarithromycin Other (See Comments)    All "mycins", Puts into "a" fib, Will take if has to for severe sinus infection  .  Effexor [Venlafaxine] Nausea And Vomiting and Other (See Comments)    cramps  . Lexapro [Escitalopram Oxalate] Other (See Comments)    hallucinations  . Aciphex [Rabeprazole Sodium] Rash  . Alendronate Sodium Other (See Comments)    "caused stomach problems for 3 days"  . Avelox [Moxifloxacin Hcl In Nacl] Other (See Comments)    Stomach cramps.   Darlin Coco [Valdecoxib] Rash  . Ceclor [Cefaclor] Rash  . Cephalexin Rash and Other (See Comments)    Pt states that she is possibly allergic to this - had a reaction to Cefaclor in the past and she does not want to these class drugs. Added per patient request.  . Covera-Hs [Verapamil Hcl] Palpitations  . Dicyclomine Hcl Rash  . Other Other (See Comments)    Glue from ekg/heart monitor leads --rash, Any MYCINS  . Tessalon Perles Rash    Physical Exam General: Mildly obese elderly Caucasian lady, seated, in no evident distress.  She is on home oxygen. Head: head normocephalic and atraumatic.   Neck: supple  with soft right carotid bruit.   Cardiovascular: regular rate and rhythm, no murmurs Musculoskeletal: no deformity Skin:  no rash/petichiae Vascular:  Normal pulses all extremities  Neurologic Exam Mental Status: Awake and fully alert. Oriented to place and time. Recent and remote memory intact. Attention span, concentration and fund of knowledge appropriate. Mood and affect appropriate.    recall 3/3.  Able to name 15 animals which can walk on 4 legs.  Clock drawing 4/4.  Calculation and naming intact Cranial Nerves: Fundoscopic exam difficult to do through undilated right pupil is 3 mm sluggishly reactive left pupil is 2 mm briskly reactive.. Extraocular movements full without nystagmus but slight exotropia of right eye.. Visual fields full to confrontation in left eye.  Right eye had no light perception and she is legally blind.  In the right eye.  Hearing intact. Facial sensation intact. Face, tongue, palate moves normally and  symmetrically.  Motor: Normal bulk and tone. Normal strength in all tested extremity muscles. Sensory.:  Slight diminished touch pinprick sensation left hemibody.  Position vibration intact. Coordination: Rapid alternating movements normal in all extremities. Finger-to-nose and heel-to-shin performed accurately bilaterally. Gait and Station: Arises from chair without difficulty. Stance is normal. Gait demonstrates normal stride length and balance .  Not able to heel, toe and tandem walk .  Reflexes: 1+ and symmetric. Toes downgoing.      ASSESSMENT: 74 year old lady with sudden onset of painless right eye vision loss in January 2021 secondary to central retinal artery occlusion.  Also mild cognitive and memory difficulties due to age-appropriate mild cognitive impairment.  Vascular risk factors of atrial fibrillation, hypertension, hyperlipidemia, peripheral vascular disease and cerebrovascular disease     PLAN: I had a long discussion the patient regarding her recent episode of right eye vision loss due to central retinal artery occlusion as well as memory and cognitive difficulties due to mild cognitive impairment and answered questions.  Reviewed results of recent  memory panel labs and EEG.  I advised her to continue warfarin for stroke prevention given history of atrial fibrillation and maintain aggressive risk factor modification with strict control of hypertension with blood pressure goal below 130/90, lipids with LDL cholesterol goal below 70 mg percent and diabetes with hemoglobin A1c goal below 6.5%.  She is also encouraged to eat a healthy diet and to be active and lose weight.  I also encouraged her to increase participation in cognitively challenging activities like solving crossword puzzles, playing bridge and sodoku.  Start prevagen 10 mg daily for memory loss.We also discussed memory compensation strategies.  Return for follow-up in 6 months or call earlier if necessary.  Greater than  50% time during this 30-minute  visit were spent on counseling and coordination of care about retinal artery occlusion and mild cognitive impairment and answering questions. Antony Contras, MD  [facility] [address] [place] [employer], [place] [address]  Phone [phone] Fax [phone] Note: This document was prepared with digital dictation and possible smart phrase technology. Any transcriptional errors that result from this process are unintentional.

## 2020-05-02 ENCOUNTER — Telehealth: Payer: Self-pay

## 2020-05-02 NOTE — Telephone Encounter (Signed)
I called pt and went over all of her medications and took off the meds that she listed on her mychart message. She wanted me to add fish oil to the list because she forgot to tell me yesterday. I discuss the final medication list and pt stated it was correct. She verbalized understanding

## 2020-05-02 NOTE — Telephone Encounter (Signed)
I left pt and had to leave a voice mail concerning her mychart message. Pt had visit yesterday and left mychart message stating that the list needed to be updated.

## 2020-05-02 NOTE — Telephone Encounter (Signed)
Pt calling back to discuss medications

## 2020-05-06 ENCOUNTER — Other Ambulatory Visit: Payer: Self-pay | Admitting: Cardiovascular Disease

## 2020-05-12 ENCOUNTER — Other Ambulatory Visit: Payer: Self-pay | Admitting: Family Medicine

## 2020-05-12 NOTE — Telephone Encounter (Signed)
Requested Prescriptions  Pending Prescriptions Disp Refills   montelukast (SINGULAIR) 10 MG tablet [Pharmacy Med Name: MONTELUKAST SOD 10 MG TABLET] 90 tablet 1    Sig: TAKE 1 TABLET BY MOUTH EVERYDAY AT BEDTIME     Pulmonology:  Leukotriene Inhibitors Passed - 05/12/2020 12:50 AM      Passed - Valid encounter within last 12 months    Recent Outpatient Visits          5 months ago Medicare annual wellness visit, subsequent   [facility], Lilia Argue, MD   6 months ago Vitamin D deficiency   [facility], Lilia Argue, MD   6 months ago Diarrhea, unspecified type   [facility] at [facility], Lilia Argue, MD   11 months ago Hyperglycemia, drug-induced   [facility] at [facility], Arlie Solomons, MD   11 months ago Candidal intertrigo   [facility], Lilia Argue, MD      Future Appointments            In 4 days Rutherford Guys, MD [facility] at [place], [place]   In 1 month [place], Ander Slade, MD [facility] [place], [facility]   In 5 months Garvin Fila, MD [facility]            oxybutynin (DITROPAN-XL) 10 MG 24 hr tablet [Pharmacy Med Name: OXYBUTYNIN CL ER 10 MG TABLET] 30 tablet 5    Sig: TAKE 1 TABLET BY [place]     Urology:  Bladder Agents Passed - 05/12/2020 12:50 AM      Passed - Valid encounter within last 12 months    Recent Outpatient Visits          5 months ago Medicare annual wellness visit, subsequent   [facility], Lilia Argue, MD   6 months ago Vitamin D deficiency   [facility], Lilia Argue, MD   6 months ago Diarrhea, unspecified type   [facility], Lilia Argue, MD   11 months ago Hyperglycemia, drug-induced   [facility] at [facility], Arlie Solomons, MD   11 months ago Candidal intertrigo   [facility], Lilia Argue, MD      Future Appointments            In 4 days Rutherford Guys, MD Primary [facility] at [place], [place]   In 1  month [place], Ander Slade, MD [facility] [place], [facility]   In 5 months Garvin Fila, MD [facility]

## 2020-05-15 ENCOUNTER — Other Ambulatory Visit: Payer: Self-pay | Admitting: Cardiology

## 2020-05-16 ENCOUNTER — Ambulatory Visit (INDEPENDENT_AMBULATORY_CARE_PROVIDER_SITE_OTHER): Payer: Medicare Other

## 2020-05-16 ENCOUNTER — Encounter: Payer: Self-pay | Admitting: Family Medicine

## 2020-05-16 ENCOUNTER — Other Ambulatory Visit: Payer: Self-pay | Admitting: Family Medicine

## 2020-05-16 ENCOUNTER — Ambulatory Visit (INDEPENDENT_AMBULATORY_CARE_PROVIDER_SITE_OTHER): Payer: Medicare Other | Admitting: Family Medicine

## 2020-05-16 ENCOUNTER — Other Ambulatory Visit: Payer: Self-pay

## 2020-05-16 VITALS — BP 138/89 | HR 60 | Temp 97.9°F | Resp 15 | Ht 64.0 in | Wt 186.2 lb

## 2020-05-16 DIAGNOSIS — M79674 Pain in right toe(s): Secondary | ICD-10-CM

## 2020-05-16 DIAGNOSIS — M79672 Pain in left foot: Secondary | ICD-10-CM | POA: Diagnosis not present

## 2020-05-16 DIAGNOSIS — I10 Essential (primary) hypertension: Secondary | ICD-10-CM

## 2020-05-16 DIAGNOSIS — E559 Vitamin D deficiency, unspecified: Secondary | ICD-10-CM

## 2020-05-16 DIAGNOSIS — E7800 Pure hypercholesterolemia, unspecified: Secondary | ICD-10-CM

## 2020-05-16 DIAGNOSIS — R739 Hyperglycemia, unspecified: Secondary | ICD-10-CM

## 2020-05-16 DIAGNOSIS — W5503XA Scratched by cat, initial encounter: Secondary | ICD-10-CM

## 2020-05-16 DIAGNOSIS — S50811A Abrasion of right forearm, initial encounter: Secondary | ICD-10-CM

## 2020-05-16 DIAGNOSIS — T50905A Adverse effect of unspecified drugs, medicaments and biological substances, initial encounter: Secondary | ICD-10-CM

## 2020-05-16 NOTE — Progress Notes (Addendum)
7/15/20218:51 AM  Laurie Horton 1946-08-08, 74 y.o., female [id number]  Chief Complaint  Patient presents with  . Follow-up    HPI:   Patient is a 74 y.o. 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, COPD, prediabetes, DDD lumbar spine, fibromylagia who presents todayto who presents today for routine followup   She is overall doing well Takes all her medications as prescribed Followed reg by cards, neuro and optho She was cleaning out her freezer and a frozen 5lbs tube of ground beef fell on to her left foot on it 4 days ago Bruising and pain are much better She is also having issues with right 2nd toe bending and rubbing against her other toes, she keeps hitting it against her bed posts for past week Her cat scratched her right arm 2 days ago, she has been cleaning, she denies any f/c/malaise/streaking/swollen nodes She completed high dose vitamin D  Depression screen [facility] 2/9 05/16/2020 12/14/2019 11/07/2019  Decreased Interest 2 0 0  Down, Depressed, Hopeless 2 0 0  PHQ - 2 Score 4 0 0  Altered sleeping - - -  Tired, decreased energy - - -  Change in appetite - - -  Feeling bad or failure about yourself  - - -  Trouble concentrating - - -  Moving slowly or fidgety/restless - - -  Suicidal thoughts - - -  PHQ-9 Score - - -  Some recent data might be hidden    Fall Risk  05/16/2020 12/25/2019 12/14/2019 11/07/2019 05/22/2019  Falls in the past year? 0 0 1 0 0  Number falls in past yr: 0 - 0 0 0  Comment - - fell twice over dog - -  Injury with Fall? 0 - 0 0 0  Comment - - - - -  Follow up Falls evaluation completed - Falls evaluation completed;Education provided - -     Allergies  Allergen Reactions  . Alprazolam Anaphylaxis and Other (See Comments)    REACTION: stops breathing  . Bee Venom Anaphylaxis  . Iodine Anaphylaxis, Swelling and  Other (See Comments)    REACTION: swelling in throat  . Pseudoephedrine Hcl Er Shortness Of Breath  . Budesonide-Formoterol Fumarate Other (See Comments)    Blisters inside of mouth all over  . Crestor [Rosuvastatin Calcium] Other (See Comments)    Unable to walk  . Esomeprazole Magnesium Other (See Comments)    REACTION: "bouncing off walls"  . Flonase [Fluticasone Propionate] Other (See Comments)    NOSE BLEED  . Lamictal [Lamotrigine] Rash    Patient got rash, labored breathing, and diarrhea  . Loratadine Other (See Comments)    claritin D causes shaking  . Lotrimin [Clotrimazole] Other (See Comments)    Mouth blisters  . Lunesta [Eszopiclone] Other (See Comments)    REACTION: "slept for a week"  . Oxcarbazepine Other (See Comments)    Causes deep sleep and dizziness  . Statins Other (See Comments)    Can't walk, legs won't work   . Zolpidem Tartrate Other (See Comments)    REACTION: "slept for a week"  . Betadine [Povidone Iodine] Other (See Comments)    Breathing problems  . Bevespi Aerosphere [Glycopyrrolate-Formoterol] Other (See Comments)    Pt believes this caused mouth sores and thrush   . Clarithromycin Other (See Comments)    All "mycins", Puts into "a" fib, Will take if  has to for severe sinus infection  . Effexor [Venlafaxine] Nausea And Vomiting and Other (See Comments)    cramps  . Lexapro [Escitalopram Oxalate] Other (See Comments)    hallucinations  . Aciphex [Rabeprazole Sodium] Rash  . Alendronate Sodium Other (See Comments)    "caused stomach problems for 3 days"  . Avelox [Moxifloxacin Hcl In Nacl] Other (See Comments)    Stomach cramps.   Darlin Coco [Valdecoxib] Rash  . Ceclor [Cefaclor] Rash  . Cephalexin Rash and Other (See Comments)    Pt states that she is possibly allergic to this - had a reaction to Cefaclor in the past and she does not want to these class drugs. Added per patient request.  . Covera-Hs [Verapamil Hcl] Palpitations  . Dicyclomine  Hcl Rash  . Other Other (See Comments)    Glue from ekg/heart monitor leads --rash, Any MYCINS  . Tessalon Perles Rash    Prior to Admission medications   Medication Sig Start Date End Date Taking? Authorizing Provider  Apoaequorin (PREVAGEN) 10 MG CAPS Take 10 mg by mouth every morning. 05/01/20  Yes Garvin Fila, MD  brimonidine (ALPHAGAN) 0.2 % ophthalmic solution Place 1 drop into the right eye 2 (two) times daily. 11/22/19 11/21/20 Yes Bernarda Caffey, MD  carvedilol (COREG) 12.5 MG tablet Take 1 tablet (12.5 mg total) by mouth 2 (two) times daily. 06/15/19 06/09/20 Yes Lorretta Harp, MD  clopidogrel (PLAVIX) 75 MG tablet Take 1 tablet (75 mg total) by mouth daily. 11/24/19  Yes Martinique, Peter M, MD  desvenlafaxine (PRISTIQ) 50 MG 24 hr tablet Take 1 tablet (50 mg total) by mouth daily. 02/20/20  Yes Arfeen, Arlyce Harman, MD  diclofenac sodium (VOLTAREN) 1 % GEL APPLY 2 GRAMS TO BOTH HANDS 4 TIMES A DAY 06/21/18  Yes [provider]  dorzolamide-timolol (COSOPT) 22.3-6.8 MG/ML ophthalmic solution Place 1 drop into the right eye 2 (two) times daily. 11/22/19 11/21/20 Yes Bernarda Caffey, MD  Eyelid Cleansers (OCUSOFT BABY EYELID & EYELASH EX) Apply topically.   Yes [provider]  furosemide (LASIX) 40 MG tablet Take 1 tablet (40 mg total) by mouth daily. 10/02/19  Yes Martinique, Peter M, MD  gabapentin (NEURONTIN) 100 MG capsule Take 200 mg by mouth See admin instructions. Take 1 in am and 1 in the lunch and 2 at bedtime 09/16/16  Yes [provider]  HYDROcodone-acetaminophen (NORCO/VICODIN) 5-325 MG tablet Take 1 tablet by mouth 3 (three) times daily.    Yes [provider]  ipratropium (ATROVENT) 0.02 % nebulizer solution USE 1 VIAL BY NEBULIZATION 4 (FOUR) TIMES DAILY. 10/17/19  Yes Brand Males, MD  montelukast (SINGULAIR) 10 MG tablet TAKE 1 TABLET BY MOUTH EVERYDAY AT BEDTIME 05/12/20  Yes Rutherford Guys, MD  Omega-3 Fatty Acids (FISH OIL) 1000 MG CAPS Take  by mouth daily.   Yes [provider]  ondansetron (ZOFRAN-ODT) 8 MG disintegrating tablet TAKE 1 TABLET BY MOUTH EVERY 8 HOURS AS NEEDED NAUSEA OR VOMITING 12/04/19  Yes Rutherford Guys, MD  oxybutynin (DITROPAN-XL) 10 MG 24 hr tablet TAKE 1 TABLET BY MOUTH EVERY DAY 05/12/20  Yes Rutherford Guys, MD  OXYGEN Inhale 1.5-2 L into the lungs as needed (for shortness of breath).   Yes [provider]  pantoprazole (PROTONIX) 40 MG tablet Take 1 tablet (40 mg total) by mouth daily. 11/07/19  Yes Rutherford Guys, MD  Polyethyl Glycol-Propyl Glycol (SYSTANE FREE OP) 4 drops per day in left eye 02/21/20  Yes [provider]  polyethylene glycol (MIRALAX / GLYCOLAX) packet Take 17 g by mouth daily as needed for mild constipation.    Yes [provider]  rosuvastatin (CRESTOR) 5 MG tablet TAKE 1 TABLET BY MOUTH 2 TIMES A WEEK. SUNDAY AND WEDNESDAY 05/07/20  Yes Lorretta Harp, MD  spironolactone (ALDACTONE) 25 MG tablet TAKE 1 TABLET BY MOUTH EVERY DAY 02/07/20  Yes Martinique, Peter M, MD  tiZANidine (ZANAFLEX) 2 MG tablet Take 2 mg by mouth at bedtime. Take one in noon and 1 bedtime   Yes [provider]  traZODone (DESYREL) 100 MG tablet Take one tab daily as needed for sleep 02/20/20  Yes Arfeen, Arlyce Harman, MD  triamcinolone cream (KENALOG) 0.1 % SMARTSIG:1 Sparingly Topical Twice Daily PRN 04/18/20  Yes [provider]  VALIUM 5 MG tablet Take by mouth every 6 (six) hours as needed.  04/18/20  Yes [provider]  warfarin (COUMADIN) 5 MG tablet TAKE 1/2 TO 1 TABLET BY MOUTH EVERY DAY OR AS DIRECTED BY COUMADIN CLINIC Patient taking differently: Take 2.5-5 mg by mouth See admin instructions. Take 2.5 mg on Friday, take 5 mg on all other days. 10/20/19  Yes Martinique, Peter M, MD    Past Medical History:  Diagnosis Date  . Cataract    OD  . CHF (congestive heart failure) ([place]) 03/02/2017   EF 25-30% 2018  . Complication of anesthesia    various issues  with oxygen  saturations post op  . COPD (chronic obstructive pulmonary disease) ([place])   . Depression   . Diverticulitis   . DVT (deep venous thrombosis) ([place])   . Fibromyalgia   . GERD (gastroesophageal reflux disease)   . Hiatal hernia   . History of deviated nasal septum    left- side  . HTN (hypertension)   . Hyperlipidemia   . Hypertensive retinopathy    OU  . Obesity   . On supplemental oxygen therapy    concentrator at night @ 1.5 l/m or when sleeps. O2 Sat niormally 87.  . Osteoporosis   . PAT (paroxysmal atrial tachycardia) ([place])   . PFO (patent foramen ovale)   . PULMONARY NODULE, LEFT LOWER LOBE 10/14/2009   102mm LLL nodule dec 2010. Stable and 2mm in Oct 2012. No further fu  . PVD (peripheral vascular disease) with claudication ([place]) 12/2017  . Right middle lobe pneumonia 07/24/2011   First noted at admit 07/10/11. Persists on cxr 07/22/11. Cleared on CT 08/24/11. No further followup  . Stroke ([place])   . TOBACCO ABUSE 06/04/2009    Past Surgical History:  Procedure Laterality Date  . CARDIOVASCULAR STRESS TEST  12/26/2004   EF 74%. NO EVIDENCE OF ISCHEMIA  . CATARACT EXTRACTION Left    Dr. Elliot Dally  . ESOPHAGOGASTRODUODENOSCOPY (EGD) WITH PROPOFOL N/A 04/15/2015   Procedure: ESOPHAGOGASTRODUODENOSCOPY (EGD) WITH PROPOFOL;  Surgeon: Laurence Spates, MD;  Location: [facility];  Service: Endoscopy;  Laterality: N/A;  . EYE SURGERY Left    Cat Sx  . KNEE ARTHROSCOPY  2000   left  . LAPAROSCOPIC CHOLECYSTECTOMY  04-16-2010   [facility]  . LOWER EXTREMITY ANGIOGRAPHY N/A 09/09/2017   Procedure: Lower Extremity Angiography;  Surgeon: Lorretta Harp, MD;  Location: [facility];  Service: Cardiovascular;  Laterality: N/A;  . LOWER EXTREMITY INTERVENTION Left 01/17/2018   Procedure: LOWER EXTREMITY INTERVENTION;  Surgeon: Lorretta Harp, MD;  Location: [facility];  Service: Cardiovascular;  Laterality: Left;  . MOUTH  SURGERY     03-26-15 multiple extractions  stitches remains  . PERIPHERAL VASCULAR INTERVENTION Left 01/17/2018   Procedure: PERIPHERAL VASCULAR INTERVENTION;  Surgeon: Lorretta Harp, MD;  Location: [facility];  Service: Cardiovascular;  Laterality: Left;  COMMON ILIAC  . RIGHT/LEFT HEART CATH AND CORONARY ANGIOGRAPHY N/A 03/04/2017   Procedure: Right/Left Heart Cath and Coronary Angiography;  Surgeon: Peter M Martinique, MD;  Location: [facility];  Service: Cardiovascular;  Laterality: N/A;  . TOTAL ABDOMINAL HYSTERECTOMY     post op needed oxygen was told "she gave them a scare"  . TUBAL LIGATION    . US ECHOCARDIOGRAPHY  11/20/2009   EF 55-60%    Social History   Tobacco Use  . Smoking status: Former Smoker    Packs/day: 3.00    Years: 30.00    Pack years: 90.00    Types: Cigarettes    Quit date: 06/02/2010    Years since quitting: 9.9  . Smokeless tobacco: Never Used  . Tobacco comment: QUIT IN 2011  Substance Use Topics  . Alcohol use: No    Alcohol/week: 0.0 standard drinks    Family History  Problem Relation Age of Onset  . Dementia Mother   . Diabetes Mother   . Alzheimer's disease Mother   . Heart attack Brother 31  . Heart attack Father   . Schizophrenia Sister   . Diabetes Sister   . Tremor Sister     Review of Systems  Constitutional: Negative for chills and fever.  Respiratory: Negative for cough and shortness of breath.   Cardiovascular: Negative for chest pain, palpitations and leg swelling.  Gastrointestinal: Negative for abdominal pain, nausea and vomiting.     OBJECTIVE:  Today's Vitals   05/16/20 0825  BP: 138/89  Pulse: 60  Resp: 15  Temp: 97.9 F (36.6 C)  TempSrc: Temporal  SpO2: 93%  Weight: 186 lb 3.2 oz (84.5 kg)  Height: 5\' 4"  (1.626 m)   Body mass index is 31.96 kg/m.  Wt Readings from Last 3 Encounters:  05/16/20 186 lb 3.2 oz (84.5 kg)  05/01/20 192 lb (87.1 kg)  02/01/20 193 lb 3.2 oz (87.6 kg)    Physical Exam Vitals and nursing note reviewed.    Constitutional:      Appearance: She is well-developed.  HENT:     Head: Normocephalic and atraumatic.     Mouth/Throat:     Pharynx: No oropharyngeal exudate.  Eyes:     General: No scleral icterus.    Conjunctiva/sclera: Conjunctivae normal.     Pupils: Pupils are equal, round, and reactive to light.  Cardiovascular:     Rate and Rhythm: Normal rate and regular rhythm.     Heart sounds: Normal heart sounds. No murmur heard.  No friction rub. No gallop.   Pulmonary:     Effort: Pulmonary effort is normal.     Breath sounds: Normal breath sounds. No wheezing, rhonchi or rales.  Musculoskeletal:     Cervical back: Neck supple.     Right foot: Normal range of motion. Deformity (2nd toe with valgus misalignment) present.     Left foot: Normal range of motion.  Feet:     Right foot:     Skin integrity: Erythema present.     Left foot:     Skin integrity: Erythema present.     Comments: Poor pedal pulses. Mild swelling and brusing across midfoot.  Skin:    General: Skin is warm and  dry.     Comments: Right forearm with small bruise with central scab, no vesicles/blisters noted, no local LN  Neurological:     Mental Status: She is alert and oriented to person, place, and time.     No results found for this or any previous visit (from the past 24 hour(s)).  DG Foot Complete Left  Result Date: 05/16/2020 CLINICAL DATA:  Soft tissue swelling EXAM: LEFT FOOT - COMPLETE 3+ VIEW COMPARISON:  None. FINDINGS: Frontal, oblique, and lateral views were obtained. There is no appreciable fracture or dislocation. Joint spaces appear unremarkable. No erosive change. IMPRESSION: No fracture or dislocation.  No evident arthropathy. Electronically Signed   By: Lowella Grip III M.D.   On: 05/16/2020 09:26   DG Foot Complete Right  Result Date: 05/16/2020 CLINICAL DATA:  Pain and swelling EXAM: RIGHT FOOT COMPLETE - 3+ VIEW COMPARISON:  None. FINDINGS: Frontal, oblique, and lateral views  obtained. No appreciable acute fracture or dislocation. Focal calcification is noted medial to the distal most aspect of the first proximal phalanx, probably due to prior avulsion injury in this area. The joint spaces appear normal. No erosive change. IMPRESSION: Suspect prior avulsion along the medial aspect of the distal portion of the first proximal phalanx. No acute fracture or dislocation evident. Joint spaces appear normal. No erosive change. Electronically Signed   By: Lowella Grip III M.D.   On: 05/16/2020 09:28     ASSESSMENT and PLAN  1. Essential hypertension Controlled. Continue current regime.   2. Pure hypercholesterolemia Checking labs today, medications will be adjusted as needed.  - Comprehensive metabolic panel - Lipid panel  3. Left foot pain Xray shows no acute fractures. Continue with supportive measures  4. Pain in right toe(s) Xray shows no acute fracture. Continues with supportive measures, buddy taping for treatment of sprain as painful. Reviewed RTC precautions  5. Vitamin D deficiency - Vitamin D, 25-hydroxy  6. Hyperglycemia, drug-induced - Hemoglobin A1c  7. Cat scratch of right forearm, initial encounter No signs of CSD on today's exam. RTC precautions reviewed   Return in about 6 months (around 11/16/2020).    Rutherford Guys, MD [facility] [place] [address], [place] [address] Ph.  [phone] Fax [phone]

## 2020-05-16 NOTE — Patient Instructions (Signed)
° ° ° °  If you have lab work done today you will be contacted with your lab results within the next 2 weeks.  If you have not heard from us then please contact us. The fastest way to get your results is to register for My Chart. ° ° °IF you received an x-ray today, you will receive an invoice from [facility]. Please contact [facility] at [phone] with questions or concerns regarding your invoice.  ° °IF you received labwork today, you will receive an invoice from [facility]. Please contact [facility] at [phone] with questions or concerns regarding your invoice.  ° °Our billing staff will not be able to assist you with questions regarding bills from these companies. ° °You will be contacted with the lab results as soon as they are available. The fastest way to get your results is to activate your My Chart account. Instructions are located on the last page of this paperwork. If you have not heard from us regarding the results in 2 weeks, please contact this office. °  ° ° ° °

## 2020-05-17 LAB — COMPREHENSIVE METABOLIC PANEL
ALT: 8 IU/L (ref 0–32)
AST: 14 IU/L (ref 0–40)
Albumin/Globulin Ratio: 2.6 — ABNORMAL HIGH (ref 1.2–2.2)
Albumin: 4.7 g/dL (ref 3.7–4.7)
Alkaline Phosphatase: 83 IU/L (ref 48–121)
BUN/Creatinine Ratio: 16 (ref 12–28)
BUN: 16 mg/dL (ref 8–27)
Bilirubin Total: 0.6 mg/dL (ref 0.0–1.2)
CO2: 26 mmol/L (ref 20–29)
Calcium: 10.3 mg/dL (ref 8.7–10.3)
Chloride: 93 mmol/L — ABNORMAL LOW (ref 96–106)
Creatinine, Ser: 1.01 mg/dL — ABNORMAL HIGH (ref 0.57–1.00)
GFR calc Af Amer: 63 mL/min/{1.73_m2} (ref >59)
GFR calc non Af Amer: 55 mL/min/{1.73_m2} — ABNORMAL LOW (ref >59)
Globulin, Total: 1.8 g/dL (ref 1.5–4.5)
Glucose: 109 mg/dL — ABNORMAL HIGH (ref 65–99)
Potassium: 4.5 mmol/L (ref 3.5–5.2)
Sodium: 135 mmol/L (ref 134–144)
Total Protein: 6.5 g/dL (ref 6.0–8.5)

## 2020-05-17 LAB — VITAMIN D 25 HYDROXY (VIT D DEFICIENCY, FRACTURES): Vit D, 25-Hydroxy: 39.9 ng/mL (ref 30.0–100.0)

## 2020-05-17 LAB — LIPID PANEL
Chol/HDL Ratio: 4.3 ratio (ref 0.0–4.4)
Cholesterol, Total: 184 mg/dL (ref 100–199)
HDL: 43 mg/dL (ref >39)
LDL Chol Calc (NIH): 116 mg/dL — ABNORMAL HIGH (ref 0–99)
Triglycerides: 140 mg/dL (ref 0–149)
VLDL Cholesterol Cal: 25 mg/dL (ref 5–40)

## 2020-05-17 LAB — HEMOGLOBIN A1C
Est. average glucose Bld gHb Est-mCnc: 137 mg/dL
Hgb A1c MFr Bld: 6.4 % — ABNORMAL HIGH (ref 4.8–5.6)

## 2020-05-20 ENCOUNTER — Other Ambulatory Visit: Payer: Self-pay | Admitting: Family Medicine

## 2020-05-20 LAB — HM MAMMOGRAPHY

## 2020-05-20 MED ORDER — ROSUVASTATIN CALCIUM 5 MG PO TABS: ORAL_TABLET | ORAL | 8 refills | Status: DC

## 2020-05-24 ENCOUNTER — Encounter: Payer: Self-pay | Admitting: Family Medicine

## 2020-05-25 ENCOUNTER — Other Ambulatory Visit: Payer: Self-pay | Admitting: Family Medicine

## 2020-05-27 ENCOUNTER — Encounter (HOSPITAL_COMMUNITY): Payer: Self-pay | Admitting: Psychiatry

## 2020-05-27 ENCOUNTER — Telehealth (INDEPENDENT_AMBULATORY_CARE_PROVIDER_SITE_OTHER): Payer: Medicare Other | Admitting: Psychiatry

## 2020-05-27 ENCOUNTER — Other Ambulatory Visit: Payer: Self-pay

## 2020-05-27 VITALS — Wt 186.0 lb

## 2020-05-27 DIAGNOSIS — F331 Major depressive disorder, recurrent, moderate: Secondary | ICD-10-CM | POA: Diagnosis not present

## 2020-05-27 DIAGNOSIS — F411 Generalized anxiety disorder: Secondary | ICD-10-CM | POA: Diagnosis not present

## 2020-05-27 MED ORDER — DESVENLAFAXINE SUCCINATE ER 50 MG PO TB24: 50.0000 mg | ORAL_TABLET | Freq: Every day | ORAL | 2 refills | Status: DC

## 2020-05-27 MED ORDER — TRAZODONE HCL 100 MG PO TABS: ORAL_TABLET | ORAL | 2 refills | Status: DC
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
## 2020-05-31 ENCOUNTER — Other Ambulatory Visit: Payer: Self-pay | Admitting: Cardiovascular Disease

## 2020-05-31 ENCOUNTER — Other Ambulatory Visit: Payer: Self-pay | Admitting: Cardiology

## 2020-06-14 NOTE — Progress Notes (Signed)
Cardiology Office Note    Date:  06/17/2020   ID:  Horton, Laurie 10/26/1946, MRN [id number]  PCP:  [name], MD  Cardiologist: Dr. [place]   Chief Complaint  Patient presents with  . Atrial Fibrillation  . Coronary Artery Disease    History of Present Illness:    Laurie Horton is a 74 y.o. female with past medical history of chronic diastolic CHF, PFO, PAF (on Coumadin), COPD (on 2L [place] at baseline), HTN, and prior CVA who is seen for follow up CHF and PAD.  She was admitted from 4/9 - 02/12/2017 for worsening dyspnea on exertion and palpitations. Was in atrial fibrillation with RVR upon arrival to the ED. Echo during admission showed a newly reduced EF of 20-25% and she was diuresed with IV Lasix. Enzymes were negative and EKG showed no acute ischemic changes. It was recommended to consider a right/left heart cath in 4-6 weeks.   She did undergo right and left heart cath on 03/04/17. This showed severe 2 vessel obstructive CAD with 100% RCA occlusion, 75% OM1, and 90% small OM2. EF 25-30%. Mild pulmonary HTN with normal LV filling pressures. 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 Maximizing CHF therapy recommended.  She did have repeat Echo in September 2018 showing improvement in EF to 50-55%.   Subsequent to this she developed significant claudication. She was seen by Dr. Allyson Sabal and had angiography showing 80% infrarenal aortic stenosis and left iliac stenosis. She also had severe right common femoral stenosis. She was seen by Dr Myra Gianotti for consideration of Aortobifemoral bypass. After pulmonary evaluation she was felt to be too high a risk for open surgery. In March 2019 she underwent atherectomy and covered stenting of the left iliac by Dr. Allyson Sabal. On follow up she did have improvement in her claudication and ABIs. The aortic and right common femoral artery stenoses are not felt to be amenable to percutaneous therapy.   In early July 2019 she was seen because she  felt she was in Afib following dental procedure. On arrival she was in NSR with PACs. No medical changes made.   She has been followed by Dr Allyson Sabal and  dopplers in May 2020 indicated continued patency of the stent. No significant claudication. She was noted to have a high grade left subclavian stenosis but it was unclear that this was symptomatic and given all her medical problems was felt best to manage medically. She had left shoulder injection by Dr Cleophas Dunker for adhesive capsulitis. She notes this has helped with her pain significantly. She is followed by pulmonary for COPD.   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 INR had been therapeutic. At time of infarct it was 1.9. We decided to add plavix 75 mg daily due to her extensive vascular disease. MRI showed no acute infarct but she did have evidence of multiple old strokes.   She had follow up LE arterial dopplers in May 2021 which were stable.   On follow up today she reports an episode 3 weeks ago when her HR went to 112 by her BP cuff and she complained of chest pain and left arm tingling. She lay down and went to sleep. When she awoke her pain and tachycardia had resolved but she felt wiped out. She denies any increase in claudication. No dyspnea or swelling. No bleeding. She is unable to take more than the 3 days/week Crestor  5 mg. Higher doses give her muscle weakness.    Past Medical History:  Diagnosis Date  . Cataract    OD  . CHF (congestive heart failure) (HCC) 03/02/2017   EF 25-30% 2018  . Complication of anesthesia    various issues with oxygen  saturations post op  . COPD (chronic obstructive pulmonary disease) (HCC)   . Depression   . Diverticulitis   . DVT (deep venous thrombosis) (HCC)   . Fibromyalgia   . GERD (gastroesophageal reflux disease)   . Hiatal hernia   . History of deviated nasal septum    left- side  . HTN (hypertension)   . Hyperlipidemia    . Hypertensive retinopathy    OU  . Obesity   . On supplemental oxygen therapy    concentrator at night @ 1.5 l/m or when sleeps. O2 Sat niormally 87.  . Osteoporosis   . PAT (paroxysmal atrial tachycardia) (HCC)   . PFO (patent foramen ovale)   . PULMONARY NODULE, LEFT LOWER LOBE 10/14/2009   5mm LLL nodule dec 2010. Stable and 4mm in Oct 2012. No further fu  . PVD (peripheral vascular disease) with claudication (HCC) 12/2017  . Right middle lobe pneumonia 07/24/2011   First noted at admit 07/10/11. Persists on cxr 07/22/11. Cleared on CT 08/24/11. No further followup  . Stroke (HCC)   . TOBACCO ABUSE 06/04/2009    Past Surgical History:  Procedure Laterality Date  . CARDIOVASCULAR STRESS TEST  12/26/2004   EF 74%. NO EVIDENCE OF ISCHEMIA  . CATARACT EXTRACTION Left    Dr. Hortense Ramal  . ESOPHAGOGASTRODUODENOSCOPY (EGD) WITH PROPOFOL N/A 04/15/2015   Procedure: ESOPHAGOGASTRODUODENOSCOPY (EGD) WITH PROPOFOL;  Surgeon: Carman Ching, MD;  Location: [facility];  Service: Endoscopy;  Laterality: N/A;  . EYE SURGERY Left    Cat Sx  . KNEE ARTHROSCOPY  2000   left  . LAPAROSCOPIC CHOLECYSTECTOMY  04-16-2010   [place]  . LOWER EXTREMITY ANGIOGRAPHY N/A 09/09/2017   Procedure: Lower Extremity Angiography;  Surgeon: Runell Gess, MD;  [facility] INVASIVE CV LAB;  Service: Cardiovascular;  Laterality: N/A;  . LOWER EXTREMITY INTERVENTION Left 01/17/2018   Procedure: LOWER EXTREMITY INTERVENTION;  Surgeon: Runell Gess, MD;  Location: [facility] INVASIVE CV LAB;  Service: Cardiovascular;  Laterality: Left;  . MOUTH SURGERY     03-26-15 multiple extractions stitches remains  . PERIPHERAL VASCULAR INTERVENTION Left 01/17/2018   Procedure: PERIPHERAL VASCULAR INTERVENTION;  Surgeon: Runell Gess, MD;  Location: [facility] INVASIVE CV LAB;  Service: Cardiovascular;  Laterality: Left;  COMMON ILIAC  . RIGHT/LEFT HEART CATH AND CORONARY ANGIOGRAPHY N/A 03/04/2017   Procedure: Right/Left Heart Cath and  Coronary Angiography;  Surgeon: Kysen Wetherington M Swazil[place], MD;  Location: [facility] INVASIVE CV LAB;  Service: Cardiovascular;  Laterality: N/A;  . TOTAL ABDOMINAL HYSTERECTOMY     post op needed oxygen was told "she gave them a scare"  . TUBAL LIGATION    . US ECHOCARDIOGRAPHY  11/20/2009   EF 55-60%    Current Medications: Outpatient Medications Prior to Visit  Medication Sig Dispense Refill  . brimonidine (ALPHAGAN) 0.2 % ophthalmic solution Place 1 drop into the right eye 2 (two) times daily. 15 mL 6  . carvedilol (COREG) 12.5 MG tablet TAKE 1 TABLET (12.5 MG TOTAL) BY MOUTH 2 (TWO) TIMES DAILY. 90 tablet 1  . clopidogrel (PLAVIX) 75 MG tablet TAKE 1 TABLET BY MOUTH EVERY DAY 90 tablet 3  . desvenlafaxine (PRISTIQ) 50 MG 24 hr tablet  Take 1 tablet (50 mg total) by mouth daily. 30 tablet 2  . diclofenac sodium (VOLTAREN) 1 % GEL APPLY 2 GRAMS TO BOTH HANDS 4 TIMES A DAY  5  . dorzolamide-timolol (COSOPT) 22.3-6.8 MG/ML ophthalmic solution Place 1 drop into the right eye 2 (two) times daily. 10 mL 6  . Eyelid Cleansers (OCUSOFT BABY EYELID & EYELASH EX) Apply topically.    . furosemide (LASIX) 40 MG tablet Take 1 tablet (40 mg total) by mouth daily. 90 tablet 3  . gabapentin (NEURONTIN) 100 MG capsule Take 200 mg by mouth See admin instructions. Take 1 in am and 1 in the lunch and 2 at bedtime    . HYDROcodone-acetaminophen (NORCO/VICODIN) 5-325 MG tablet Take 1 tablet by mouth 3 (three) times daily.     Marland Kitchen ipratropium (ATROVENT) 0.02 % nebulizer solution USE 1 VIAL BY NEBULIZATION 4 (FOUR) TIMES DAILY. 75 mL 3  . montelukast (SINGULAIR) 10 MG tablet TAKE 1 TABLET BY MOUTH EVERYDAY AT BEDTIME 90 tablet 2  . Omega-3 Fatty Acids (FISH OIL) 1000 MG CAPS Take by mouth daily.    . ondansetron (ZOFRAN-ODT) 8 MG disintegrating tablet TAKE 1 TABLET BY MOUTH EVERY 8 HOURS AS NEEDED NAUSEA OR VOMITING 20 tablet 0  . oxybutynin (DITROPAN-XL) 10 MG 24 hr tablet TAKE 1 TABLET BY MOUTH EVERY DAY 90 tablet 2  . OXYGEN  Inhale 1.5-2 L into the lungs as needed (for shortness of breath).    . pantoprazole (PROTONIX) 40 MG tablet TAKE 1 TABLET BY MOUTH EVERY DAY 90 tablet 1  . Polyethyl Glycol-Propyl Glycol (SYSTANE FREE OP) 4 drops per day in left eye    . polyethylene glycol (MIRALAX / GLYCOLAX) packet Take 17 g by mouth daily as needed for mild constipation.     . rosuvastatin (CRESTOR) 5 MG tablet Take 1 tablet (5mg ) by mouth 3 times a week, Monday, Wednesday and Friday. 12 tablet 8  . spironolactone (ALDACTONE) 25 MG tablet TAKE 1 TABLET BY MOUTH EVERY DAY 90 tablet 3  . tiZANidine (ZANAFLEX) 2 MG tablet Take 2 mg by mouth at bedtime. Take one in noon and 1 bedtime    . traZODone (DESYREL) 100 MG tablet Take one tab daily as needed for sleep 30 tablet 2  . triamcinolone cream (KENALOG) 0.1 % SMARTSIG:1 Sparingly Topical Twice Daily PRN    . VALIUM 5 MG tablet Take 2 mg by mouth as needed.     . warfarin (COUMADIN) 5 MG tablet TAKE 1/2 TO 1 TABLET BY MOUTH EVERY DAY OR AS DIRECTED BY COUMADIN CLINIC 30 tablet 5  . Apoaequorin (PREVAGEN) 10 MG CAPS Take 10 mg by mouth every morning. 1 capsule 0   No facility-administered medications prior to visit.     Allergies:   Alprazolam, Bee venom, Iodine, Pseudoephedrine hcl er, Budesonide-formoterol fumarate, Crestor [rosuvastatin calcium], Esomeprazole magnesium, Flonase [fluticasone propionate], Lamictal [lamotrigine], Loratadine, Lotrimin [clotrimazole], Lunesta [eszopiclone], Oxcarbazepine, Statins, Zolpidem tartrate, Betadine [povidone iodine], Bevespi aerosphere [glycopyrrolate-formoterol], Clarithromycin, Effexor [venlafaxine], Lexapro [escitalopram oxalate], Aciphex [rabeprazole sodium], Alendronate sodium, Avelox [moxifloxacin hcl in nacl], Bextra [valdecoxib], Ceclor [cefaclor], Cephalexin, Covera-hs [verapamil hcl], Dicyclomine hcl, Other, and Tessalon perles   Social History   Socioeconomic History  . Marital status: Divorced    Spouse name: Not on file  .  Number of children: 2  . Years of education: Not on file  . Highest education level: Not on file  Occupational History  . Occupation: disability  Tobacco Use  . Smoking status: Former  Smoker    Packs/day: 3.00    Years: 30.00    Pack years: 90.00    Types: Cigarettes    Quit date: 06/02/2010    Years since quitting: 10.0  . Smokeless tobacco: Never Used  . Tobacco comment: QUIT IN 2011  Vaping Use  . Vaping Use: Never used  Substance and Sexual Activity  . Alcohol use: No    Alcohol/week: 0.0 standard drinks  . Drug use: No  . Sexual activity: Yes    Partners: Male    Birth control/protection: Post-menopausal, Surgical  Other Topics Concern  . Not on file  Social History Narrative   Marital status: divorced in 1978 after ten years; dating casually in 2019.      Children: 3 biological children; 3 court appointed children; 6 grandchildren; 5 gg      Lives: alone; children in [place] and [place].      Employment: retired age 60; disability for COPD, CVA at age 39.      Tobacco: former smoker; quit smoking 2011.      Alcohol: none      Exercise: walks dog four times per day; goes to pool three times per week.      ADLs: drives; no assistant devices; does have a walker.  Cleaning is limited in 2018.  Daughter helps with cleaning.  Does own grocery shopping.      Advanced Directives: YES; DNR/DNI; HCPOA: [name] Martin/daughter youngest.  Blind.               Social Determinants of Health   Financial Resource Strain:   . Difficulty of Paying Living Expenses:   Food Insecurity:   . Worried About Programme researcher, broadcasting/film/video in the Last Year:   . Barista in the Last Year:   Transportation Needs:   . Freight forwarder (Medical):   Marland Kitchen Lack of Transportation (Non-Medical):   Physical Activity:   . Days of Exercise per Week:   . Minutes of Exercise per Session:   Stress:   . Feeling of Stress :   Social Connections:   . Frequency of Communication with Friends  and Family:   . Frequency of Social Gatherings with Friends and Family:   . Attends Religious Services:   . Active Member of Clubs or Organizations:   . Attends Banker Meetings:   Marland Kitchen Marital Status:      Family History:  The patient's family history includes Alzheimer's disease in her mother; Dementia in her mother; Diabetes in her mother and sister; Heart attack in her father; Heart attack (age of onset: 58) in her brother; Schizophrenia in her sister; Tremor in her sister.   Review of Systems:   As noted in HPI.  All other systems reviewed and are otherwise negative except as noted above.   Physical Exam:    VS:  BP (!) 142/68   Pulse (!) 57   Ht 5\' 3"  (1.6 m)   Wt 188 lb 6.4 oz (85.5 kg)   SpO2 95%   BMI 33.37 kg/m    GENERAL:  Well appearing overweight WF in NAD HEENT:  PERRL, EOMI, sclera are clear. Oropharynx is clear. NECK:  No jugular venous distention, carotid upstroke brisk and symmetric, left subclavian  bruit, no thyromegaly or adenopathy LUNGS:  Clear to auscultation bilaterally CHEST:  Unremarkable HEART:  RRR,  PMI not displaced or sustained,S1 and S2 within normal limits, no S3, no S4: no clicks, no rubs, no  murmurs ABD:  Soft, nontender. BS +, no masses or bruits. No hepatomegaly, no splenomegaly EXT:  Poor pedal pulses, absent left radial pulse.  no edema, no cyanosis no clubbing SKIN:  Warm and dry.  No rashes NEURO:  Alert and oriented x 3. Cranial nerves II through XII intact. PSYCH:  Cognitively intact    Wt Readings from Last 3 Encounters:  06/17/20 188 lb 6.4 oz (85.5 kg)  05/16/20 186 lb 3.2 oz (84.5 kg)  05/01/20 192 lb (87.1 kg)     Studies/Labs Reviewed:   Recent Labs: 11/22/2019: Hemoglobin 12.9; Platelets 240 12/25/2019: TSH 3.760 05/16/2020: ALT 8; BUN 16; Creatinine, Ser 1.01; Potassium 4.5; Sodium 135   Lipid Panel    Component Value Date/Time   CHOL 184 05/16/2020 0921   TRIG 140 05/16/2020 0921   HDL 43 05/16/2020  0921   CHOLHDL 4.3 05/16/2020 0921   CHOLHDL 4.6 07/02/2016 0823   VLDL 25 07/02/2016 0823   LDLCALC 116 (H) 05/16/2020 0921    Ecg today shows NSR rate 46. T wave inversion c/w anterior ischemia. Improved from prior. I have personally reviewed and interpreted this study.   Additional studies/ records that were reviewed today include:   Echocardiogram: 02/09/2017 Study Conclusions  - Left ventricle: The cavity size was mildly dilated. Wall   thickness was increased in a pattern of mild LVH. Systolic   function was severely reduced. The estimated ejection fraction   was in the range of 20% to 25%. Diffuse hypokinesis. Doppler   parameters are consistent with abnormal left ventricular   relaxation (grade 1 diastolic dysfunction). - Aortic valve: Valve area (Vmax): 1.4 cm^2. - Aortic root: The aortic root was mildly dilated. - Ascending aorta: The ascending aorta was mildly dilated. - Mitral valve: Calcified annulus. There was moderate   regurgitation. - Left atrium: The atrium was moderately dilated. - Right ventricle: Systolic function was moderately reduced. - Pulmonary arteries: Systolic pressure was mildly increased. - Pericardium, extracardiac: A trivial pericardial effusion was   identified.  Impressions:  - No subcostal views; severe global reduction in LV systolic   function; grade 1 diastolic dysfunction; mildly dilated aortic   root and ascending aorta; moderate MR; moderaet LAE; moderately   reduced RV function; mild TR; mildly elevated pulmonary pressure.  Procedures   Right/Left Heart Cath and Coronary Angiography  Conclusion     Ost 1st Mrg to 1st Mrg lesion, 75 %stenosed.  2nd Mrg lesion, 90 %stenosed.  Ost RCA to Mid RCA lesion, 100 %stenosed.  There is severe left ventricular systolic dysfunction.  LV end diastolic pressure is normal.  The left ventricular ejection fraction is 25-35% by visual estimate.  Hemodynamic findings consistent with  mild pulmonary hypertension.  LV end diastolic pressure is normal.   1. Severe 2 vessel obstructive CAD    - 75% proximal OM1    - 90% small OM2    - 100% proximal RCA. Left to right collaterals.  2. Severe LV dysfunction 3. Mild pulmonary HTN with normal LV filling pressures.  4. Cardiac index 2.41 L/min/BSA   Plan: Medical management to try and optimize CHF therapy. Patient appears to be adequately diuresed at this time. Based on these results her cardiomyopathy is ischemic. I would treat her CAD medically. If cardiac cath is needed in the future would consider alternative access given difficulty from the right radial approach. Her rhythm during procedure is a multifocal atrial rhythm/tachycardia.     Echo 07/08/17: Study Conclusions  - Left  ventricle: The cavity size was normal. There was moderate   concentric hypertrophy. Systolic function was normal. The   estimated ejection fraction was in the range of 50% to 55%.   Severe hypokinesis of the basal-midinferior myocardium;   consistent with infarction in the distribution of the right   coronary artery. Doppler parameters are consistent with abnormal   left ventricular relaxation (grade 1 diastolic dysfunction). - Mitral valve: Calcified annulus.  Impressions:  - Compared to April 2018 there is marked improvement in contraction   of all LV wall segemnts except for the inferior wall, which   remains severely hypokinetic. There is also marked reduction in   the severity of mitral insufficiency.   Assessment:    1. PAF (paroxysmal atrial fibrillation) (HCC)   2. Chronic systolic CHF (congestive heart failure) (HCC)   3. Coronary artery disease of native artery of native heart with stable angina pectoris (HCC)   4. Essential hypertension   5. PAD (peripheral artery disease) (HCC)      Plan:   In order of problems listed above:  1. Chronic Combined Systolic and Diastolic CHF/  Secondary to ischemic/tachycardia  mediated Cardiomyopathy - EF 25-30% in 2018 with ischemic cardiomyopathy. Not a candidate for revascularization. With medical management EF improved to 50-55% by Echo.  - she is euvolemic at this time. Weight is stable.  - continue BB, aldactone, and statin. ARB discontinued due to orthostatic dizziness and BP is stable. Will continue current therapy.  - Ecg today is unchanged.  2. Paroxysmal Atrial Fibrillation/ multifocal atrial tachycardia - This patients CHA2DS2-VASc Score and unadjusted Ischemic Stroke Rate (% per year) is equal to 9.7 % stroke rate/year from a score of 6 (CHF, HTN, Female, Age, CVA (2)). She denies any evidence of active bleeding. Continue Coumadin for anticoagulation. Check INR today.  3. HTN- controlled.   4. COPD- - followed by Pulmonology.   5. PFO - no plans for closure   6. PAD s/p covered stenting of left iliac. Stenosis in distal aorta and right femoral not amenable to percutaneous therapy. Left subclavian stenosis. Claudication is stable. Followed by Dr Allyson Sabal. Stable dopplers in May.  7. CAD. 100% RCA. 75% OM. Chronic stable angina class 1. Suspect recent  Continue medical management. Will give sl Ntg for chest pain. If symptoms worsen may need to reevaluate  8. S/p right retinal artery occlusion. Plavix added to coumadin due to severe vascular disease. I feel benefit of Plavix/coumadin combination outweighs risk of bleeding at this time.  9. Old CVAs noted on MRI  10. Hypercholesterolemia. LDL 116 on maximally tolerated statin. I would like to consider her for PCSK 9 inhibitor. Will have pharmacy to discuss with her. Goal LDL < 70.     I will follow up in 4 months.   Signed, Kaeleen Odom [place], MD  06/17/2020 8:27 AM    [facility] [address] [place], [place] [address] Phone: [phone]

## 2020-06-17 ENCOUNTER — Ambulatory Visit (INDEPENDENT_AMBULATORY_CARE_PROVIDER_SITE_OTHER): Payer: Medicare Other | Admitting: Cardiology

## 2020-06-17 ENCOUNTER — Ambulatory Visit (INDEPENDENT_AMBULATORY_CARE_PROVIDER_SITE_OTHER): Payer: Medicare Other

## 2020-06-17 ENCOUNTER — Other Ambulatory Visit: Payer: Self-pay

## 2020-06-17 ENCOUNTER — Encounter: Payer: Self-pay | Admitting: Cardiology

## 2020-06-17 VITALS — BP 142/68 | HR 57 | Ht 63.0 in | Wt 188.4 lb

## 2020-06-17 DIAGNOSIS — Z7901 Long term (current) use of anticoagulants: Secondary | ICD-10-CM | POA: Diagnosis not present

## 2020-06-17 DIAGNOSIS — I5022 Chronic systolic (congestive) heart failure: Secondary | ICD-10-CM

## 2020-06-17 DIAGNOSIS — I10 Essential (primary) hypertension: Secondary | ICD-10-CM | POA: Diagnosis not present

## 2020-06-17 DIAGNOSIS — I739 Peripheral vascular disease, unspecified: Secondary | ICD-10-CM

## 2020-06-17 DIAGNOSIS — I25118 Atherosclerotic heart disease of native coronary artery with other forms of angina pectoris: Secondary | ICD-10-CM | POA: Diagnosis not present

## 2020-06-17 DIAGNOSIS — I480 Paroxysmal atrial fibrillation: Secondary | ICD-10-CM

## 2020-06-17 LAB — POCT INR: INR: 2.7 (ref 2.0–3.0)

## 2020-06-17 MED ORDER — NITROGLYCERIN 0.4 MG SL SUBL
0.4000 mg | SUBLINGUAL_TABLET | SUBLINGUAL | 3 refills | Status: DC | PRN
Start: 2020-06-17 — End: 2021-06-30

## 2020-06-17 MED ORDER — NITROGLYCERIN 0.4 MG SL SUBL
0.4000 mg | SUBLINGUAL_TABLET | SUBLINGUAL | 11 refills | Status: DC | PRN
Start: 2020-06-17 — End: 2020-07-16

## 2020-06-17 NOTE — Addendum Note (Signed)
Addended by: Kathyrn Lass on: 06/17/2020 08:45 AM   Modules accepted: Orders

## 2020-06-17 NOTE — Patient Instructions (Signed)
Take sublingual Ntg prn chest pain. If pain persists for greater than 15 minutes despite taking the Ntg call 911.

## 2020-06-17 NOTE — Patient Instructions (Signed)
Continue taking 1/2 tablet daily except 1 tablet each Monday and Friday.  Repeat INR in 6 weeks  Weight 192.0

## 2020-06-27 ENCOUNTER — Encounter (INDEPENDENT_AMBULATORY_CARE_PROVIDER_SITE_OTHER): Payer: Medicare Other | Admitting: Ophthalmology

## 2020-07-15 ENCOUNTER — Encounter: Payer: Self-pay | Admitting: Family Medicine

## 2020-07-16 ENCOUNTER — Other Ambulatory Visit: Payer: Self-pay

## 2020-07-16 ENCOUNTER — Ambulatory Visit (INDEPENDENT_AMBULATORY_CARE_PROVIDER_SITE_OTHER): Payer: Medicare Other | Admitting: Pharmacist

## 2020-07-16 VITALS — BP 124/72 | HR 58 | Resp 14 | Ht 64.0 in | Wt 192.4 lb

## 2020-07-16 DIAGNOSIS — E7800 Pure hypercholesterolemia, unspecified: Secondary | ICD-10-CM | POA: Diagnosis not present

## 2020-07-16 NOTE — Patient Instructions (Signed)
Your Results:             Your most recent labs Goal  Total Cholesterol 184 < 200  Triglycerides 140 < 150  HDL (good cholesterol) 43 > 40  LDL (bad cholesterol) 116 < 70     Medication changes: *DECREASE rosuvastatin to 5mg  every Monday ONLY *START Repatha SureClick 140mg  every 14 days  *[email or web address] to see YouTube video for injection administration*  CLINIC phone number: [phone]*  Lab orders: *Repeat blood work after 4 doses of new medication*  Patient Assistance:  The [employer] foundation offers assistance to help pay for medication copays.  They will cover copays for all cholesterol lowering meds, including statins, fibrates, omega-3 oils, ezetimibe, Repatha, Praluent, Nexletol, Nexlizet.  The cards are usually good for $2,500 or 12 months, whichever comes first. 1. Go to healthwellfoundation.org 2. Click on "Apply Now" 3. Answer questions as to whom is applying (patient or representative) 4. Your disease fund will be "hypercholesterolemia - Medicare access" 5. They will ask questions about finances and which medications you are taking for cholesterol 6. When you submit, the approval is usually within minutes.  You will need to print the card information from the site 7. You will need to show this information to your pharmacy, they will bill your Medicare Part D plan first -then bill [employer] --for the copay.   You can also call them at [phone], although the hold times can be quite long.   Thank you for choosing [facility]

## 2020-07-16 NOTE — Progress Notes (Signed)
Patient ID: Laurie Horton                 DOB: 11/14/1945                    MRN: [id number]     HPI:  Laurie Horton is a 74 y.o. female patient referred to lipid clinic by Dr [place]. PMH is significant for Afib, HF, stroke, TIA, PFO, and  carotid ultrasound on 08/07/2019 which showed 40 to 50% left ICA and 1 to 39% right ICA stenosis. She is intolerant to daily atorvastatin and rosuvastatin. Failed trial with low dose rosuvastatin as well.   Current Medications:  Rosuvastatin 5mg  3x/week  Omega-3 100mg  daily  Intolerances:  rosuvastatin 5mg  daily Atorvastatin - severe muscle pain  LDL goal: < 70mg /dL  Diet: lots of fruits, fish 2-3 times per week, lots of vegetables  Exercise: chair exercises; grow young fitness daily  Family History: dementia, DM, and Alzheimer's in mother; MI inn father and brother, diabetes, schizophrenia in sister  Social History: former smoker, denies alcohol use  Labs: 07/29/2020: CHO 184, TG 140, HDL 43, LDL 116 (rosuvastatin 5mg  twice weekly)  Past Medical History:  Diagnosis Date   Cataract    OD   CHF (congestive heart failure) (HCC) 03/02/2017   EF 25-30% 2018   Complication of anesthesia    various issues with oxygen  saturations post op   COPD (chronic obstructive pulmonary disease) (HCC)    Depression    Diverticulitis    DVT (deep venous thrombosis) (HCC)    Fibromyalgia    GERD (gastroesophageal reflux disease)    Hiatal hernia    History of deviated nasal septum    left- side   HTN (hypertension)    Hyperlipidemia    Hypertensive retinopathy    OU   Obesity    On supplemental oxygen therapy    concentrator at night @ 1.5 l/m or when sleeps. O2 Sat niormally 87.   Osteoporosis    PAT (paroxysmal atrial tachycardia) (HCC)    PFO (patent foramen ovale)    PULMONARY NODULE, LEFT LOWER LOBE 10/14/2009   5mm LLL nodule dec 2010. Stable and 4mm in Oct 2012. No further fu   PVD (peripheral vascular disease)  with claudication (HCC) 12/2017   Right middle lobe pneumonia 07/24/2011   First noted at admit 07/10/11. Persists on cxr 07/22/11. Cleared on CT 08/24/11. No further followup   Stroke [facility])    TOBACCO ABUSE 06/04/2009    Current Outpatient Medications on File Prior to Visit  Medication Sig Dispense Refill   carvedilol (COREG) 12.5 MG tablet TAKE 1 TABLET (12.5 MG TOTAL) BY MOUTH 2 (TWO) TIMES DAILY. 90 tablet 1   clopidogrel (PLAVIX) 75 MG tablet TAKE 1 TABLET BY MOUTH EVERY DAY 90 tablet 3   desvenlafaxine (PRISTIQ) 50 MG 24 hr tablet Take 1 tablet (50 mg total) by mouth daily. 30 tablet 2   diclofenac sodium (VOLTAREN) 1 % GEL APPLY 2 GRAMS TO BOTH HANDS 4 TIMES A DAY  5   Eyelid Cleansers (OCUSOFT BABY EYELID & EYELASH EX) Apply topically.     furosemide (LASIX) 40 MG tablet Take 1 tablet (40 mg total) by mouth daily. 90 tablet 3   gabapentin (NEURONTIN) 100 MG capsule Take 200 mg by mouth See admin instructions. Take 1 in am and 1 in the lunch and 2 at bedtime     HYDROcodone-acetaminophen (NORCO/VICODIN) 5-325 MG tablet Take 1 tablet by mouth  3 (three) times daily.      ipratropium (ATROVENT) 0.02 % nebulizer solution USE 1 VIAL BY NEBULIZATION 4 (FOUR) TIMES DAILY. 75 mL 3   montelukast (SINGULAIR) 10 MG tablet TAKE 1 TABLET BY MOUTH EVERYDAY AT BEDTIME 90 tablet 2   nitroGLYCERIN (NITROSTAT) 0.4 MG SL tablet Place 1 tablet (0.4 mg total) under the tongue every 5 (five) minutes as needed for chest pain. 90 tablet 3   Omega-3 Fatty Acids (FISH OIL) 1000 MG CAPS Take by mouth daily.     ondansetron (ZOFRAN-ODT) 8 MG disintegrating tablet TAKE 1 TABLET BY MOUTH EVERY 8 HOURS AS NEEDED NAUSEA OR VOMITING 20 tablet 0   oxybutynin (DITROPAN-XL) 10 MG 24 hr tablet TAKE 1 TABLET BY MOUTH EVERY DAY 90 tablet 2   OXYGEN Inhale 1.5-2 L into the lungs as needed (for shortness of breath).     pantoprazole (PROTONIX) 40 MG tablet TAKE 1 TABLET BY MOUTH EVERY DAY 90 tablet 1    polyethylene glycol (MIRALAX / GLYCOLAX) packet Take 17 g by mouth daily as needed for mild constipation.      rosuvastatin (CRESTOR) 5 MG tablet Take 1 tablet (5mg ) by mouth 3 times a week, Monday, Wednesday and Friday. 12 tablet 8   spironolactone (ALDACTONE) 25 MG tablet TAKE 1 TABLET BY MOUTH EVERY DAY 90 tablet 3   tiZANidine (ZANAFLEX) 2 MG tablet Take 2 mg by mouth at bedtime. Take one in noon and 1 bedtime     traZODone (DESYREL) 100 MG tablet Take one tab daily as needed for sleep 30 tablet 2   triamcinolone cream (KENALOG) 0.1 % SMARTSIG:1 Sparingly Topical Twice Daily PRN     VALIUM 5 MG tablet Take 2 mg by mouth as needed.      warfarin (COUMADIN) 5 MG tablet TAKE 1/2 TO 1 TABLET BY MOUTH EVERY DAY OR AS DIRECTED BY COUMADIN CLINIC 30 tablet 5   No current facility-administered medications on file prior to visit.    Allergies  Allergen Reactions   Alprazolam Anaphylaxis and Other (See Comments)    REACTION: stops breathing   Bee Venom Anaphylaxis   Iodine Anaphylaxis, Swelling and Other (See Comments)    REACTION: swelling in throat   Pseudoephedrine Hcl Er Shortness Of Breath   Budesonide-Formoterol Fumarate Other (See Comments)    Blisters inside of mouth all over   Crestor [Rosuvastatin Calcium] Other (See Comments)    Unable to walk   Esomeprazole Magnesium Other (See Comments)    REACTION: "bouncing off walls"   Flonase [Fluticasone Propionate] Other (See Comments)    NOSE BLEED   Lamictal [Lamotrigine] Rash    Patient got rash, labored breathing, and diarrhea   Loratadine Other (See Comments)    claritin D causes shaking   Lotrimin [Clotrimazole] Other (See Comments)    Mouth blisters   Lunesta [Eszopiclone] Other (See Comments)    REACTION: "slept for a week"   Oxcarbazepine Other (See Comments)    Causes deep sleep and dizziness   Statins Other (See Comments)    Can't walk, legs won't work    Zolpidem Tartrate Other (See Comments)     REACTION: "slept for a week"   Betadine [Povidone Iodine] Other (See Comments)    Breathing problems   Bevespi Aerosphere [Glycopyrrolate-Formoterol] Other (See Comments)    Pt believes this caused mouth sores and thrush    Clarithromycin Other (See Comments)    All "mycins", Puts into "a" fib, Will take if has to for  severe sinus infection   Effexor [Venlafaxine] Nausea And Vomiting and Other (See Comments)    cramps   Lexapro [Escitalopram Oxalate] Other (See Comments)    hallucinations   Aciphex [Rabeprazole Sodium] Rash   Alendronate Sodium Other (See Comments)    "caused stomach problems for 3 days"   Avelox [Moxifloxacin Hcl In Nacl] Other (See Comments)    Stomach cramps.    Bextra [Valdecoxib] Rash   Ceclor [Cefaclor] Rash   Cephalexin Rash and Other (See Comments)    Pt states that she is possibly allergic to this - had a reaction to Cefaclor in the past and she does not want to these class drugs. Added per patient request.   Covera-Hs [Verapamil Hcl] Palpitations   Dicyclomine Hcl Rash   Other Other (See Comments)    Glue from ekg/heart monitor leads --rash, Any MYCINS   Tessalon Perles Rash    Pure hypercholesterolemia LDL remains above goal for secondary prevention and patient is unable to tolerate statins. We discussed prior authorization, common side effects, efficacy, storage, and monitoring for PCSKi9 (Repatha / Praluent) therapy. Repatha SureClick 140mg  sample  (LOT. [id number]; Exp 4-23) was provided during office visit for patient self-adminitration.   Will start PA for PCSK9i ,  follow up with patient as needed, and repeat fasting blood work after 4th dose of new medication.   Brayton Baumgartner Rodriguez-Guzman PharmD, BCPS, CPP [facility] [address] [place] [address] 07/18/2020 5:15 PM

## 2020-07-17 ENCOUNTER — Telehealth: Payer: Self-pay

## 2020-07-17 NOTE — Telephone Encounter (Signed)
Pt called in stating that she took the shot at 730 am and has been in pain since feels like menstral cramping and would like to speak with a pharmacist.

## 2020-07-17 NOTE — Telephone Encounter (Signed)
Patient still experiencing abdominal cramps after the Repatha. Will provide sample of low dose Praluent prior to completing PA. To determine if patient able to tolerate without issues.  Patient is willing to re-challenge with Repatha if needed.

## 2020-07-18 NOTE — Assessment & Plan Note (Addendum)
LDL remains above goal for secondary prevention and patient is unable to tolerate statins. We discussed prior authorization, common side effects, efficacy, storage, and monitoring for PCSKi9 (Repatha / Praluent) therapy. Repatha SureClick 140mg  sample  (LOT. [id number]; Exp 4-23) was provided during office visit for patient self-adminitration.   Will start PA for PCSK9i ,  follow up with patient as needed, and repeat fasting blood work after 4th dose of new medication.

## 2020-07-29 ENCOUNTER — Other Ambulatory Visit: Payer: Self-pay

## 2020-07-29 ENCOUNTER — Ambulatory Visit (INDEPENDENT_AMBULATORY_CARE_PROVIDER_SITE_OTHER): Payer: Medicare Other

## 2020-07-29 DIAGNOSIS — Z7901 Long term (current) use of anticoagulants: Secondary | ICD-10-CM

## 2020-07-29 DIAGNOSIS — I480 Paroxysmal atrial fibrillation: Secondary | ICD-10-CM

## 2020-07-29 LAB — POCT INR: INR: 2.6 (ref 2.0–3.0)

## 2020-07-29 NOTE — Patient Instructions (Signed)
Continue taking 1/2 tablet daily except 1 tablet each Monday and Friday.  Repeat INR in 6 weeks  Weight 189

## 2020-08-01 ENCOUNTER — Telehealth: Payer: Self-pay

## 2020-08-01 NOTE — Telephone Encounter (Signed)
Praluent device malfunctioned and patient was unable to get dose.  Will see at [facility] clinic tomorrow at [place] for [place]

## 2020-08-01 NOTE — Telephone Encounter (Signed)
Incoming call from the pt stating that she had issues taking the praluent shot and is unsure if the dose actually went in. Will route to raquel as she is the pharmd currently working w/her

## 2020-08-01 NOTE — Telephone Encounter (Signed)
LMOM; will follow up by the end of the day unless patient able to call back.

## 2020-08-02 ENCOUNTER — Telehealth: Payer: Self-pay

## 2020-08-02 NOTE — Telephone Encounter (Signed)
Received message from pt in regards to Repatha injections.  She called to inform us that she had experienced "cramping" 30 minutes prior to her call @ 12:42 pm.    he said "there was no need to return call, just wanted [place] to know."  She took Hydrocodone and went to bed.

## 2020-08-06 ENCOUNTER — Encounter: Payer: Self-pay | Admitting: Orthopaedic Surgery

## 2020-08-06 ENCOUNTER — Other Ambulatory Visit: Payer: Self-pay

## 2020-08-06 ENCOUNTER — Ambulatory Visit (INDEPENDENT_AMBULATORY_CARE_PROVIDER_SITE_OTHER): Payer: Medicare Other | Admitting: Orthopaedic Surgery

## 2020-08-06 DIAGNOSIS — M79605 Pain in left leg: Secondary | ICD-10-CM

## 2020-08-06 DIAGNOSIS — M79604 Pain in right leg: Secondary | ICD-10-CM | POA: Diagnosis not present

## 2020-08-06 NOTE — Progress Notes (Signed)
Office Visit Note   Patient: Laurie Horton           Date of Birth: 03/26/1946           MRN: [id number] Visit Date: 08/06/2020              Requested by: [name], MD [address] [place],  [place] [address] PCP: [name], MD   Assessment & Plan: Visit Diagnoses:  1. Leg pain, bilateral     Plan: Laurie Horton relates that she has had chronic issues with both of her lower extremities and, specifically, difficulty when she stands for more than 30 minutes or walks for any short distance.  Her legs become heavy and tired.  It certainly seems that she has symptoms consistent with claudication.  Her past evaluation is significant and that she is seeing both a neurologist and a cardiologist as well as a Physiological scientist.  She has evidence on scans of carotid artery stenosis as well as areas within her aorta.  She is already had a stent performed by Dr.Berry.  She also has had a history of TIAs and atrial fibrillation.  She presently is on warfarin and Plavix.  She has had a chronic history of low back pain and has had multiple back injections.  She had an MRI scan performed in 2015 revealing some areas of foraminal stenosis.  Her vascular scans were addended to the chart which I have reviewed. I suspect her claudication is related to either her back or to her vascular compromise.  We talked about another MRI scan of the lumbar spine but she is concerned about the cost and if anything were to be found she relates that she is not a surgical candidate and because of all clot there is could no longer have the injections as she has to stay on the Coumadin and the Plavix.  I suspect that the real cause is related to her vascular status.  She has been followed by the cardiologist and vascular surgeon who basically told her that "there is nothing we can do".  She certainly is somewhat deconditioned but has found it difficult to perform her exercises.  She lives by her self and tries to do "the  best I can.  She has had a prior left total knee replacement by Dr. Priscille Kluver approximately 23 years ago and is doing fairly well.  She certainly has evidence of thigh weakness bilaterally and it would behoove her to work on strengthening exercises off her feet.  She does not have access to an exercise bike or any machines.  From an orthopedic standpoint I really did not have anything else to offer her.  She will follow-up with her cardiologist and neurologist. Total time spent over 45 minutes 50% of the time counseling Follow-Up Instructions: Return if symptoms worsen or fail to improve.   Orders:  No orders of the defined types were placed in this encounter.  No orders of the defined types were placed in this encounter.     Procedures: No procedures performed   Clinical Data: No additional findings.   Subjective: Chief Complaint  Patient presents with  . Left Leg - Weakness  . Right Leg - Weakness  Patient presents today for bilateral leg pain and weakness. She said that it started about two months ago. No known injury. She said that she cannot walk any sort of a long distance without weakness. She said that she has had lower back pain  for many years. She is aware that she has back issues, but cannot have surgery due to other health issues. She goes to pain management for her back because she cannot have injections any longer due to blood thinners that she takes. She also states that she has difficulty with balance.  Had an MRI scan of the lumbar spine in 2015 that was stable from prior studies demonstrating moderate right foraminal stenosis at L5-S1 due to the intravertebral spurring disc bulging and facet arthropathy.  MRA head and neck in January 21 revealed severe stenosis of the left subclavian artery the common, internal and external carotid arteries were patent.  There was plaque at the common carotid artery but less than 50% stenosis.  There was no evidence of any acute infarction  hemorrhage or mass.  There was focal moderate stenosis of the distal intracranial left vertebral artery and suspected severe stenosis of the left proximal subclavian artery.  2018 she had MRA of the pelvis with and without contrast demonstrating extensive area celiac plaque there was short segment aortic stenosis in the infra regional region and also in the mid left common iliac artery.  She has been evaluated by the neurologist, cardiologist and vascular surgeon.  She has been told by the vascular surgeon that nothing else could be done.  She has had a prior stent by Dr. Allyson Sabal.  HPI  Review of Systems  Constitutional: Negative for fatigue.  HENT: Negative for ear pain.   Eyes: Negative for pain.  Respiratory: Positive for shortness of breath.   Cardiovascular: Positive for leg swelling.  Gastrointestinal: Negative for constipation and diarrhea.  Endocrine: Negative for cold intolerance and heat intolerance.  Genitourinary: Negative for difficulty urinating.  Musculoskeletal: Negative for joint swelling.  Skin: Negative for rash.  Allergic/Immunologic: Negative for food allergies.  Neurological: Positive for weakness.  Hematological: Bruises/bleeds easily.  Psychiatric/Behavioral: Positive for sleep disturbance.     Objective: Vital Signs: Ht 5\' 4"  (1.626 m)   Wt 192 lb (87.1 kg)   BMI 32.96 kg/m   Physical Exam Constitutional:      Appearance: She is well-developed.  Eyes:     Pupils: Pupils are equal, round, and reactive to light.  Pulmonary:     Effort: Pulmonary effort is normal.  Skin:    General: Skin is warm and dry.  Neurological:     Mental Status: She is alert and oriented to person, place, and time.  Psychiatric:        Behavior: Behavior normal.     Ortho Exam awake alert and oriented x3.  Comfortable sitting.  No issues with ambulation.  Thought she had significant weakness of both of her thighs with muscle atrophy.  No abnormalities referable to her left  total knee replacement.  There is no instability or effusion.  Few areas of tenderness but skin was intact.  No calf pain.  Painless range of motion both hips.  Straight leg raise negative.  Feet were warm.  +1 pulses.  No percussible tenderness of the lumbar spine  Specialty Comments:  No specialty comments available.  Imaging: No results found.   PMFS History: Patient Active Problem List   Diagnosis Date Noted  . Leg pain, bilateral 08/06/2020  . 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 Pain in right shoulder 11/08/2019  . Multiple drug allergies 09/15/2019  . Pain in left shoulder 06/21/2019  . Subclavian artery stenosis, left (HCC) 05/18/2019  . Claudication in peripheral vascular disease (HCC) 08/06/2017  .  Class 1 obesity due to excess calories with serious comorbidity and body mass index (BMI) of 31.0 to 31.9 in adult 05/01/2017  . Chronic systolic CHF (congestive heart failure) (HCC) 03/04/2017  . Acute on chronic respiratory failure with hypoxia (HCC) 02/08/2017  . COPD, severe (HCC) 02/08/2017  . Acute diastolic heart failure, NYHA class 1 (HCC) 02/08/2017  . Fibromyalgia and chronic chest pain 02/08/2017  . Atrial fibrillation with RVR (HCC) 02/08/2017  . Acute heart failure (HCC) 02/08/2017  . Rapid atrial fibrillation (HCC) 02/08/2017  . Shortness of breath 02/08/2017  . Moderate episode of recurrent major depressive disorder (HCC) 01/04/2017  . Generalized anxiety disorder 01/04/2017  . Pure hypercholesterolemia 12/28/2016  . 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 Acute blood loss anemia 08/03/2016  . Carotid stenosis 04/02/2015  . Long term current use of anticoagulant therapy 09/26/2014  . PAF (paroxysmal atrial fibrillation) (HCC) 09/19/2014  . 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 Chronic respiratory failure with hypoxia (HCC) 06/27/2014  . 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 Idiopathic scoliosis and kyphoscoliosis 07/19/2013  . Hyperglycemia, drug-induced 10/12/2011  . Encounter for long-term (current) use of medications 08/21/2011  . Thrush 07/24/2011  . PULMONARY NODULE, LEFT LOWER LOBE 10/14/2009  . PATENT FORAMEN OVALE 10/14/2009  . Essential hypertension 06/04/2009  . G E R D 06/04/2009  . SNORING, HX OF 06/04/2009   Past Medical History:  Diagnosis Date  . Cataract    OD  . CHF (congestive heart failure) (HCC) 03/02/2017   EF 25-30% 2018  . Complication of anesthesia    various issues with oxygen  saturations post op  . COPD (chronic obstructive pulmonary disease) (HCC)   . Depression   . Diverticulitis   . DVT (deep venous thrombosis) (HCC)   . Fibromyalgia   . GERD (gastroesophageal reflux disease)   . Hiatal hernia   . History of deviated nasal septum    left- side  . HTN (hypertension)   . Hyperlipidemia   . Hypertensive retinopathy    OU  . Obesity   . On supplemental oxygen therapy    concentrator at night @ 1.5 l/m or when sleeps. O2 Sat niormally 87.  . Osteoporosis   . PAT (paroxysmal atrial tachycardia) (HCC)   . PFO (patent foramen ovale)   . PULMONARY NODULE, LEFT LOWER LOBE 10/14/2009   5mm LLL nodule dec 2010. Stable and 4mm in Oct 2012. No further fu  . PVD (peripheral vascular disease) with claudication (HCC) 12/2017  . Right middle lobe pneumonia 07/24/2011   First noted at admit 07/10/11. Persists on cxr 07/22/11. Cleared on CT 08/24/11. No further followup  . Stroke (HCC)   . TOBACCO ABUSE 06/04/2009    Family History  Problem Relation Age of Onset  . Dementia Mother   . Diabetes Mother   . Alzheimer's disease Mother   . Heart attack Brother 64  . Heart attack Father   . Schizophrenia Sister   . Diabetes Sister   . Tremor Sister     Past Surgical History:  Procedure Laterality Date  . CARDIOVASCULAR STRESS TEST  12/26/2004   EF 74%. NO EVIDENCE OF ISCHEMIA  . CATARACT EXTRACTION Left    Dr. Hortense Ramal  . ESOPHAGOGASTRODUODENOSCOPY (EGD) WITH PROPOFOL N/A 04/15/2015   Procedure: ESOPHAGOGASTRODUODENOSCOPY (EGD) WITH PROPOFOL;  Surgeon: Carman Ching, MD;  Location: [facility];  Service: Endoscopy;  Laterality: N/A;  . EYE SURGERY Left  Cat Sx  . KNEE ARTHROSCOPY  2000   left  . LAPAROSCOPIC CHOLECYSTECTOMY  04-16-2010   [place]  . LOWER EXTREMITY ANGIOGRAPHY N/A 09/09/2017   Procedure: Lower Extremity Angiography;  Surgeon: Runell Gess, MD;  Location: [facility] INVASIVE CV LAB;  Service: Cardiovascular;  Laterality: N/A;  . LOWER EXTREMITY INTERVENTION Left 01/17/2018   Procedure: LOWER EXTREMITY INTERVENTION;  Surgeon: Runell Gess, MD;  Location: [facility] INVASIVE CV LAB;  Service: Cardiovascular;  Laterality: Left;  . MOUTH SURGERY     03-26-15 multiple extractions stitches remains  . PERIPHERAL VASCULAR INTERVENTION Left 01/17/2018   Procedure: PERIPHERAL VASCULAR INTERVENTION;  Surgeon: Runell Gess, MD;  Location[facility] INVASIVE CV LAB;  Service: Cardiovascular;  Laterality: Left;  COMMON ILIAC  . RIGHT/LEFT HEART CATH AND CORONARY ANGIOGRAPHY N/A 03/04/2017   Procedure: Right/Left Heart Cath and Coronary Angiography;  Surgeon: Lakendria Nicastro M [place], MD;  Location: [facility] INVAS[facility] CV LAB;  Service: Cardiovascular;  Laterality: N/A;  . TOTAL ABDOMINAL HYSTERECTOMY     post op needed oxygen was told "she gave them a scare"  . TUBAL LIGATION    . US ECHOCARDIOGRAPHY  11/20/2009   EF 55-60%   Social History   Occupational History  . Occupation: disability  Tobacco Use  . Smoking status: Former Smoker    Packs/day: 3.00    Years: 30.00    Pack years: 90.00    Types: Cigarettes    Quit date: 06/02/2010    Years since quitting: 10.1  . Smokeless tobacco: Never Used  . Tobacco comment: QUIT IN 2011  Vaping Use  . Vaping Use: Never used  Substance and Sexual Activity  . Alcohol use: No    Alcohol/week: 0.0 standard drinks  . Drug use: No  . Sexual activity: Yes    Partners: Male     Birth control/protection: Post-menopausal, Surgical

## 2020-08-12 ENCOUNTER — Telehealth: Payer: Self-pay | Admitting: Pharmacist

## 2020-08-12 NOTE — Telephone Encounter (Signed)
Patient was able to tolerate Praluent sample without issues (reptha cause severe cramps and pain)  Will process PA for Praluent 150mg  every 14 days and repeat fasting blood work after 1 month of therapy.  Medication Samples have been provided to the patient.  Drug name: Praluent     Strength: 150mg         Qty: 1  LOT: [id number]  Exp.Date: 05/01/2021   I dose of Praluent 150mg  was provided as sample today to cover therapy while PA process completed.

## 2020-08-26 ENCOUNTER — Telehealth (INDEPENDENT_AMBULATORY_CARE_PROVIDER_SITE_OTHER): Payer: Medicare Other | Admitting: Psychiatry

## 2020-08-26 ENCOUNTER — Other Ambulatory Visit: Payer: Self-pay

## 2020-08-26 ENCOUNTER — Encounter (HOSPITAL_COMMUNITY): Payer: Self-pay | Admitting: Psychiatry

## 2020-08-26 ENCOUNTER — Telehealth: Payer: Self-pay

## 2020-08-26 DIAGNOSIS — F411 Generalized anxiety disorder: Secondary | ICD-10-CM | POA: Diagnosis not present

## 2020-08-26 DIAGNOSIS — E7800 Pure hypercholesterolemia, unspecified: Secondary | ICD-10-CM

## 2020-08-26 DIAGNOSIS — F331 Major depressive disorder, recurrent, moderate: Secondary | ICD-10-CM

## 2020-08-26 MED ORDER — TRAZODONE HCL 100 MG PO TABS: ORAL_TABLET | ORAL | 2 refills | Status: DC

## 2020-08-26 MED ORDER — PRALUENT 75 MG/ML ~~LOC~~ SOAJ: 75.0000 mg | SUBCUTANEOUS | 11 refills | Status: DC

## 2020-08-26 MED ORDER — DESVENLAFAXINE SUCCINATE ER 50 MG PO TB24: 50.0000 mg | ORAL_TABLET | Freq: Every day | ORAL | 2 refills | Status: DC

## 2020-08-26 NOTE — Telephone Encounter (Signed)
CALLED and spoke w/pt praluent 75mg  approved, rx sent, ordered lipid labs, pt instructed to come after 4th dose for labs while at her coumadin appt, pt voiced understanding

## 2020-08-26 NOTE — Progress Notes (Signed)
Virtual Visit via Telephone Note  I connected with [name] on 08/26/20 at  8:20 AM EDT by telephone and verified that I am speaking with the correct person using two identifiers.  Location: Patient: Home Provider: Home office   I discussed the limitations, risks, security and privacy concerns of performing an evaluation and management service by telephone and the availability of in person appointments. I also discussed with the patient that there may be a patient responsible charge related to this service. The patient expressed understanding and agreed to proceed.   History of Present Illness: Patient is evaluated by phone session.  Patient told recently she had teeth taken away because it was bothering her and now she is on semisolid diet.  She has pain in her gums but she realized it is transient and it will go away very soon.  She had lost 5 to 6 pounds in recent weeks but otherwise she has no major concern.  She is sleeping good with the trazodone and she also takes muscle relaxant and narcotic pain medication.  She feels the current medicine of trazodone and Pristiq working very well and she denies any recent crying spells or any feeling of hopelessness or worthlessness.  She does get very anxious when she walks television and when people talk about political and news related to virus.  She had stopped watching television.   She endorsed increased energy in recent weeks since she had stopped watching television and able to paint 1 wall of her home and now like to finish the second well.  She noticed her attention and concentration is better.  She denies any suicidal thoughts or homicidal thoughts.  She like to keep her current medication.  She rarely takes Valium 2 mg and does not need a new refill at this time.   Past Psychiatric History: H/O depression and anxiety. No H/Oinpatient treatment, suicidal attempt, paranoia,mania andhallucination. Seeing psychiatrist in 2006 after a  stroke. Tried Cymbalta, lexaproand Celexa in the past with limited response. Tried Ambien, Lunesta, lexapro, Lamictaland Xanax but developed allergies and side effects. We triedhigherPristiq buthasside effects.  Psychiatric Specialty Exam: Physical Exam  Review of Systems  Weight 186 lb (84.4 kg).There is no height or weight on file to calculate BMI.  General Appearance: NA  Eye Contact:  NA  Speech:  fast but clear  Volume:  Normal  Mood:  Anxious and emotional  Affect:  NA  Thought Process:  Descriptions of Associations: Intact  Orientation:  Full (Time, Place, and Person)  Thought Content:  Rumination  Suicidal Thoughts:  No  Homicidal Thoughts:  No  Memory:  Immediate;   Good Recent;   Fair Remote;   Fair  Judgement:  Intact  Insight:  Present  Psychomotor Activity:  NA  Concentration:  Concentration: Fair and Attention Span: Fair  Recall:  Good  Fund of Knowledge:  Good  Language:  Good  Akathisia:  No  Handed:  Right  AIMS (if indicated):     Assets:  Communication Skills Desire for Improvement Housing Social Support  ADL's:  Intact  Cognition:  WNL  Sleep:   ok      Assessment and Plan: Major depressive disorder, recurrent.  Generalized anxiety disorder.  Patient is a stable on her current medication.  Since her teeth are taken out she is on semisolid diet and now she is hoping to get dentures.  She is happy about it because she feels more energetic as she lost weight.  She  is doing home projects.  She does not want to change medication.  She had stopped watching news and that helps her anxiety.  Continue Pristiq 50 mg daily, trazodone 100 mg at bedtime.  She does not need refills for the Valium at this time but she takes 2 mg as needed.  Patient is on narcotic pain medication and muscle relaxant.  Discussed medication side effects and benefits and polypharmacy.  Recommended to call us back if she has any question or any concern.  Follow-up in 3  months.  Follow Up Instructions:    I discussed the assessment and treatment plan with the patient. The patient was provided an opportunity to ask questions and all were answered. The patient agreed with the plan and demonstrated an understanding of the instructions.   The patient was advised to call back or seek an in-person evaluation if the symptoms worsen or if the condition fails to improve as anticipated.  I provided 19 minutes of non-face-to-face time during this encounter.   Kathlee Nations, MD

## 2020-09-09 ENCOUNTER — Other Ambulatory Visit: Payer: Self-pay

## 2020-09-09 ENCOUNTER — Ambulatory Visit (INDEPENDENT_AMBULATORY_CARE_PROVIDER_SITE_OTHER): Payer: Medicare Other

## 2020-09-09 DIAGNOSIS — I480 Paroxysmal atrial fibrillation: Secondary | ICD-10-CM | POA: Diagnosis not present

## 2020-09-09 DIAGNOSIS — Z7901 Long term (current) use of anticoagulants: Secondary | ICD-10-CM

## 2020-09-09 LAB — POCT INR: INR: 3.8 — AB (ref 2.0–3.0)

## 2020-09-09 NOTE — Patient Instructions (Signed)
Hold today and then Continue taking 1/2 tablet daily except 1 tablet each Monday and Friday.  Repeat INR in 4 weeks

## 2020-09-10 ENCOUNTER — Other Ambulatory Visit (HOSPITAL_COMMUNITY): Payer: Self-pay | Admitting: Psychiatry

## 2020-09-10 DIAGNOSIS — F331 Major depressive disorder, recurrent, moderate: Secondary | ICD-10-CM

## 2020-09-10 DIAGNOSIS — F411 Generalized anxiety disorder: Secondary | ICD-10-CM

## 2020-09-12 ENCOUNTER — Other Ambulatory Visit: Payer: Self-pay | Admitting: Cardiology

## 2020-09-16 ENCOUNTER — Telehealth (HOSPITAL_COMMUNITY): Payer: Self-pay | Admitting: *Deleted

## 2020-09-16 DIAGNOSIS — F419 Anxiety disorder, unspecified: Secondary | ICD-10-CM

## 2020-09-16 MED ORDER — DIAZEPAM 2 MG PO TABS: 2.0000 mg | ORAL_TABLET | ORAL | 0 refills | Status: DC | PRN

## 2020-09-16 NOTE — Telephone Encounter (Signed)
Pt asking for the Valium to be refilled. Pt states you forgot to refill it on last visit but your note states that she didn't need/want a refill at that time. Pt has an upcoming appointment on 11/20/20. Please review.

## 2020-09-16 NOTE — Telephone Encounter (Signed)
Valium 2 mg sent to her pharmacy to take as needed for severe anxiety.

## 2020-09-17 LAB — LIPID PANEL
Chol/HDL Ratio: 3.2 ratio (ref 0.0–4.4)
Cholesterol, Total: 115 mg/dL (ref 100–199)
HDL: 36 mg/dL — ABNORMAL LOW (ref >39)
LDL Chol Calc (NIH): 52 mg/dL (ref 0–99)
Triglycerides: 156 mg/dL — ABNORMAL HIGH (ref 0–149)
VLDL Cholesterol Cal: 27 mg/dL (ref 5–40)

## 2020-10-05 NOTE — Progress Notes (Signed)
Cardiology Office Note    Date:  10/11/2020   ID:  Laurie Horton, Laurie Horton August 10, 1946, MRN [id number]  PCP:  Lezlie Lye, Meda Coffee, MD  Cardiologist: Dr. [place]   Chief Complaint  Patient presents with  . Coronary Artery Disease  . Congestive Heart Failure    History of Present Illness:    Laurie Horton is a 74 y.o. female with past medical history of chronic diastolic CHF, PFO, PAF (on Coumadin), COPD (on 2L [place] at baseline), HTN, and prior CVA who is seen for follow up CHF and PAD.  She was admitted from 4/9 - 02/12/2017 for worsening dyspnea on exertion and palpitations. Was in atrial fibrillation with RVR upon arrival to the ED. Echo during admission showed a newly reduced EF of 20-25% and she was diuresed with IV Lasix. Enzymes were negative and EKG showed no acute ischemic changes. It was recommended to consider a right/left heart cath in 4-6 weeks.   She did undergo right and left heart cath on 03/04/17. This showed severe 2 vessel obstructive CAD with 100% RCA occlusion, 75% OM1, and 90% small OM2. EF 25-30%. Mild pulmonary HTN with normal LV filling pressures. 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 Maximizing CHF therapy recommended.  She did have repeat Echo in September 2018 showing improvement in EF to 50-55%.   Subsequent to this she developed significant claudication. She was seen by Dr. Allyson Sabal and had angiography showing 80% infrarenal aortic stenosis and left iliac stenosis. She also had severe right common femoral stenosis. She was seen by Dr Myra Gianotti for consideration of Aortobifemoral bypass. After pulmonary evaluation she was felt to be too high a risk for open surgery. In March 2019 she underwent atherectomy and covered stenting of the left iliac by Dr. Allyson Sabal. On follow up she did have improvement in her claudication and ABIs. The aortic and right common femoral artery stenoses are not felt to be amenable to percutaneous therapy.   In early July 2019 she was seen  because she felt she was in Afib following dental procedure. On arrival she was in NSR with PACs. No medical changes made.   She has been followed by Dr Allyson Sabal and  dopplers in May 2020 indicated continued patency of the stent. No significant claudication. She was noted to have a high grade left subclavian stenosis but it was unclear that this was symptomatic and given all her medical problems was felt best to manage medically. She had left shoulder injection by Dr Cleophas Dunker for adhesive capsulitis. She notes this has helped with her pain significantly. She is followed by pulmonary for COPD.   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 INR had been therapeutic. At time of infarct it was 1.9. We decided to add plavix 75 mg daily due to her extensive vascular disease. MRI showed no acute infarct but she did have evidence of multiple old strokes.   She had follow up LE arterial dopplers in May 2021 which were stable.   On follow up today she reports a lot of family stressors since September. She has had to use sl Ntg on 3 occasions all related to extreme family stress. She did get relief with 1 tablet each time. Still has visual changes that come and go. She is followed by a retinal specialist. No other new neurologic symptoms. No increase in claudication. Has leg cramps at night relieved with French's mustard.  Past Medical History:  Diagnosis Date  . Cataract    OD  . CHF (congestive heart failure) (HCC) 03/02/2017   EF 25-30% 2018  . Complication of anesthesia    various issues with oxygen  saturations post op  . COPD (chronic obstructive pulmonary disease) (HCC)   . Depression   . Diverticulitis   . DVT (deep venous thrombosis) (HCC)   . Fibromyalgia   . GERD (gastroesophageal reflux disease)   . Hiatal hernia   . History of deviated nasal septum    left- side  . HTN (hypertension)   . Hyperlipidemia   . Hypertensive  retinopathy    OU  . Obesity   . On supplemental oxygen therapy    concentrator at night @ 1.5 l/m or when sleeps. O2 Sat niormally 87.  . Osteoporosis   . PAT (paroxysmal atrial tachycardia) (HCC)   . PFO (patent foramen ovale)   . PULMONARY NODULE, LEFT LOWER LOBE 10/14/2009   5mm LLL nodule dec 2010. Stable and 4mm in Oct 2012. No further fu  . PVD (peripheral vascular disease) with claudication (HCC) 12/2017  . Right middle lobe pneumonia 07/24/2011   First noted at admit 07/10/11. Persists on cxr 07/22/11. Cleared on CT 08/24/11. No further followup  . Stroke (HCC)   . TOBACCO ABUSE 06/04/2009    Past Surgical History:  Procedure Laterality Date  . CARDIOVASCULAR STRESS TEST  12/26/2004   EF 74%. NO EVIDENCE OF ISCHEMIA  . CATARACT EXTRACTION Left    Dr. Hortense Ramal  . ESOPHAGOGASTRODUODENOSCOPY (EGD) WITH PROPOFOL N/A 04/15/2015   Procedure: ESOPHAGOGASTRODUODENOSCOPY (EGD) WITH PROPOFOL;  Surgeon: Carman Ching, MD;  Location: [facility];  Service: Endoscopy;  Laterality: N/A;  . EYE SURGERY Left    Cat Sx  . KNEE ARTHROSCOPY  2000   left  . LAPAROSCOPIC CHOLECYSTECTOMY  04-16-2010   [place]  . LOWER EXTREMITY ANGIOGRAPHY N/A 09/09/2017   Procedure: Lower Extremity Angiography;  Surgeon: Runell Gess, MD;  Location: [facility] INVASIVE CV LAB;  Service: Cardiovascular;  Laterality: N/A;  . LOWER EXTREMITY INTERVENTION Left 01/17/2018   Procedure: LOWER EXTREMITY INTERVENTION;  Surgeon: Runell Gess, MD;  Location: [facility] INVASIVE CV LAB;  Service: Cardiovascular;  Laterality: Left;  . MOUTH SURGERY     03-26-15 multiple extractions stitches remains  . PERIPHERAL VASCULAR INTERVENTION Left 01/17/2018   Procedure: PERIPHERAL VASCULAR INTERVENTION;  Surgeon: Runell Gess, MD;  Location: [facility] INVASIVE CV LAB;  Service: Cardiovascular;  Laterality: Left;  COMMON ILIAC  . RIGHT/LEFT HEART CATH AND CORONARY ANGIOGRAPHY N/A 03/04/2017   Procedure: Right/Left Heart Cath and Coronary  Angiography;  Surgeon: Quention Mcneill M [place], MD;  Location: [facility] INVASIVE CV LAB;  Service: Cardiovascular;  Laterality: N/A;  . TOTAL ABDOMINAL HYSTERECTOMY     post op needed oxygen was told "she gave them a scare"  . TUBAL LIGATION    . US ECHOCARDIOGRAPHY  11/20/2009   EF 55-60%    Current Medications: Outpatient Medications Prior to Visit  Medication Sig Dispense Refill  . Alirocumab (PRALUENT) 75 MG/ML SOAJ Inject 75 mg into the skin every 14 (fourteen) days. 2 mL 11  . carvedilol (COREG) 12.5 MG tablet TAKE 1 TABLET BY MOUTH 2 TIMES DAILY. 90 tablet 3  . clopidogrel (PLAVIX) 75 MG tablet TAKE 1 TABLET BY MOUTH EVERY DAY 90 tablet 3  . desvenlafaxine (PRISTIQ) 50 MG 24 hr tablet Take 1 tablet (50 mg total) by mouth daily. 30 tablet 2  . diazepam (VALIUM) 2  MG tablet Take 1 tablet (2 mg total) by mouth as needed for anxiety. 20 tablet 0  . diclofenac sodium (VOLTAREN) 1 % GEL APPLY 2 GRAMS TO BOTH HANDS 4 TIMES A DAY  5  . Eyelid Cleansers (OCUSOFT BABY EYELID & EYELASH EX) Apply topically.    . furosemide (LASIX) 40 MG tablet Take 1 tablet (40 mg total) by mouth daily. 90 tablet 3  . gabapentin (NEURONTIN) 100 MG capsule Take 200 mg by mouth See admin instructions. Take 1 in am and 1 in the lunch and 2 at bedtime    . HYDROcodone-acetaminophen (NORCO/VICODIN) 5-325 MG tablet Take 1 tablet by mouth 3 (three) times daily.    Marland Kitchen ipratropium (ATROVENT) 0.02 % nebulizer solution USE 1 VIAL BY NEBULIZATION 4 (FOUR) TIMES DAILY. 75 mL 3  . montelukast (SINGULAIR) 10 MG tablet TAKE 1 TABLET BY MOUTH EVERYDAY AT BEDTIME 90 tablet 2  . Omega-3 Fatty Acids (FISH OIL) 1000 MG CAPS Take by mouth daily.    . ondansetron (ZOFRAN-ODT) 8 MG disintegrating tablet TAKE 1 TABLET BY MOUTH EVERY 8 HOURS AS NEEDED NAUSEA OR VOMITING 20 tablet 0  . oxybutynin (DITROPAN-XL) 10 MG 24 hr tablet TAKE 1 TABLET BY MOUTH EVERY DAY 90 tablet 2  . OXYGEN Inhale 1.5-2 L into the lungs as needed (for shortness of breath).    .  pantoprazole (PROTONIX) 40 MG tablet TAKE 1 TABLET BY MOUTH EVERY DAY 90 tablet 1  . polyethylene glycol (MIRALAX / GLYCOLAX) packet Take 17 g by mouth daily as needed for mild constipation.     Marland Kitchen spironolactone (ALDACTONE) 25 MG tablet TAKE 1 TABLET BY MOUTH EVERY DAY 90 tablet 3  . tiZANidine (ZANAFLEX) 2 MG tablet Take 2 mg by mouth at bedtime. Take one in noon and 1 bedtime    . traZODone (DESYREL) 100 MG tablet Take one tab daily as needed for sleep 30 tablet 2  . triamcinolone cream (KENALOG) 0.1 % SMARTSIG:1 Sparingly Topical Twice Daily PRN    . warfarin (COUMADIN) 5 MG tablet TAKE 1/2 TO 1 TABLET BY MOUTH EVERY DAY OR AS DIRECTED BY COUMADIN CLINIC 30 tablet 5  . nitroGLYCERIN (NITROSTAT) 0.4 MG SL tablet Place 1 tablet (0.4 mg total) under the tongue every 5 (five) minutes as needed for chest pain. 90 tablet 3  . rosuvastatin (CRESTOR) 5 MG tablet Take 1 tablet (5mg ) by mouth 3 times a week, Monday, Wednesday and Friday. 12 tablet 8   No facility-administered medications prior to visit.     Allergies:   Alprazolam, Bee venom, Iodine, Pseudoephedrine hcl er, Budesonide-formoterol fumarate, Crestor [rosuvastatin calcium], Esomeprazole magnesium, Flonase [fluticasone propionate], Lamictal [lamotrigine], Loratadine, Lotrimin [clotrimazole], Lunesta [eszopiclone], Oxcarbazepine, Statins, Zolpidem tartrate, Betadine [povidone iodine], Bevespi aerosphere [glycopyrrolate-formoterol], Clarithromycin, Effexor [venlafaxine], Lexapro [escitalopram oxalate], Aciphex [rabeprazole sodium], Alendronate sodium, Avelox [moxifloxacin hcl in nacl], Bextra [valdecoxib], Ceclor [cefaclor], Cephalexin, Covera-hs [verapamil hcl], Dicyclomine hcl, Other, and Tessalon perles   Social History   Socioeconomic History  . Marital status: Divorced    Spouse name: Not on file  . Number of children: 2  . Years of education: Not on file  . Highest education level: Not on file  Occupational History  . Occupation:  disability  Tobacco Use  . Smoking status: Former Smoker    Packs/day: 3.00    Years: 30.00    Pack years: 90.00    Types: Cigarettes    Quit date: 06/02/2010    Years since quitting: 10.3  . Smokeless tobacco:  Never Used  . Tobacco comment: QUIT IN 2011  Vaping Use  . Vaping Use: Never used  Substance and Sexual Activity  . Alcohol use: No    Alcohol/week: 0.0 standard drinks  . Drug use: No  . Sexual activity: Yes    Partners: Male    Birth control/protection: Post-menopausal, Surgical  Other Topics Concern  . Not on file  Social History Narrative   Marital status: divorced in 1978 after ten years; dating casually in 2019.      Children: 3 biological children; 3 court appointed children; 6 grandchildren; 5 gg      Lives: alone; children in [place] and [place].      Employment: retired age 32; disability for COPD, CVA at age 45.      Tobacco: former smoker; quit smoking 2011.      Alcohol: none      Exercise: walks dog four times per day; goes to pool three times per week.      ADLs: drives; no assistant devices; does have a walker.  Cleaning is limited in 2018.  Daughter helps with cleaning.  Does own grocery shopping.      Advanced Directives: YES; DNR/DNI; HCPOA: [name] Martin/daughter youngest.  Blind.               Social Determinants of Health   Financial Resource Strain: Not on file  Food Insecurity: Not on file  Transportation Needs: Not on file  Physical Activity: Not on file  Stress: Not on file  Social Connections: Not on file     Family History:  The patient's family history includes Alzheimer's disease in her mother; Dementia in her mother; Diabetes in her mother and sister; Heart attack in her father; Heart attack (age of onset: 32) in her brother; Schizophrenia in her sister; Tremor in her sister.   Review of Systems:   As noted in HPI.  All other systems reviewed and are otherwise negative except as noted above.   Physical Exam:     VS:  BP 120/60   Pulse 64   Ht 5\' 4"  (1.626 m)   Wt 186 lb (84.4 kg)   BMI 31.93 kg/m    GENERAL:  Well appearing overweight WF in NAD HEENT:  PERRL, EOMI, sclera are clear. Oropharynx is clear. NECK:  No jugular venous distention, carotid upstroke brisk and symmetric, right carotid bruit,  left subclavian  bruit, no thyromegaly or adenopathy LUNGS:  Clear to auscultation bilaterally CHEST:  Unremarkable HEART:  RRR,  PMI not displaced or sustained,S1 and S2 within normal limits, no S3, no S4: no clicks, no rubs, no murmurs ABD:  Soft, nontender. BS +, no masses or bruits. No hepatomegaly, no splenomegaly EXT:  Poor pedal pulses, absent left radial pulse.  no edema, no cyanosis no clubbing SKIN:  Warm and dry.  No rashes NEURO:  Alert and oriented x 3. Cranial nerves II through XII intact. PSYCH:  Cognitively intact    Wt Readings from Last 3 Encounters:  10/11/20 186 lb (84.4 kg)  08/06/20 192 lb (87.1 kg)  07/16/20 192 lb 6.4 oz (87.3 kg)     Studies/Labs Reviewed:   Recent Labs: 11/22/2019: Hemoglobin 12.9; Platelets 240 12/25/2019: TSH 3.760 05/16/2020: ALT 8; BUN 16; Creatinine, Ser 1.01; Potassium 4.5; Sodium 135   Lipid Panel    Component Value Date/Time   CHOL 115 09/17/2020 0946   TRIG 156 (H) 09/17/2020 0946   HDL 36 (L) 09/17/2020 0946   CHOLHDL 3.2  09/17/2020 0946   CHOLHDL 4.6 07/02/2016 0823   VLDL 25 07/02/2016 0823   LDLCALC 52 09/17/2020 0946    Additional studies/ records that were reviewed today include:   Echocardiogram: 02/09/2017 Study Conclusions  - Left ventricle: The cavity size was mildly dilated. Wall   thickness was increased in a pattern of mild LVH. Systolic   function was severely reduced. The estimated ejection fraction   was in the range of 20% to 25%. Diffuse hypokinesis. Doppler   parameters are consistent with abnormal left ventricular   relaxation (grade 1 diastolic dysfunction). - Aortic valve: Valve area (Vmax): 1.4  cm^2. - Aortic root: The aortic root was mildly dilated. - Ascending aorta: The ascending aorta was mildly dilated. - Mitral valve: Calcified annulus. There was moderate   regurgitation. - Left atrium: The atrium was moderately dilated. - Right ventricle: Systolic function was moderately reduced. - Pulmonary arteries: Systolic pressure was mildly increased. - Pericardium, extracardiac: A trivial pericardial effusion was   identified.  Impressions:  - No subcostal views; severe global reduction in LV systolic   function; grade 1 diastolic dysfunction; mildly dilated aortic   root and ascending aorta; moderate MR; moderaet LAE; moderately   reduced RV function; mild TR; mildly elevated pulmonary pressure.  Procedures   Right/Left Heart Cath and Coronary Angiography  Conclusion     Ost 1st Mrg to 1st Mrg lesion, 75 %stenosed.  2nd Mrg lesion, 90 %stenosed.  Ost RCA to Mid RCA lesion, 100 %stenosed.  There is severe left ventricular systolic dysfunction.  LV end diastolic pressure is normal.  The left ventricular ejection fraction is 25-35% by visual estimate.  Hemodynamic findings consistent with mild pulmonary hypertension.  LV end diastolic pressure is normal.   1. Severe 2 vessel obstructive CAD    - 75% proximal OM1    - 90% small OM2    - 100% proximal RCA. Left to right collaterals.  2. Severe LV dysfunction 3. Mild pulmonary HTN with normal LV filling pressures.  4. Cardiac index 2.41 L/min/BSA   Plan: Medical management to try and optimize CHF therapy. Patient appears to be adequately diuresed at this time. Based on these results her cardiomyopathy is ischemic. I would treat her CAD medically. If cardiac cath is needed in the future would consider alternative access given difficulty from the right radial approach. Her rhythm during procedure is a multifocal atrial rhythm/tachycardia.     Echo 07/08/17: Study Conclusions  - Left ventricle: The cavity  size was normal. There was moderate   concentric hypertrophy. Systolic function was normal. The   estimated ejection fraction was in the range of 50% to 55%.   Severe hypokinesis of the basal-midinferior myocardium;   consistent with infarction in the distribution of the right   coronary artery. Doppler parameters are consistent with abnormal   left ventricular relaxation (grade 1 diastolic dysfunction). - Mitral valve: Calcified annulus.  Impressions:  - Compared to April 2018 there is marked improvement in contraction   of all LV wall segemnts except for the inferior wall, which   remains severely hypokinetic. There is also marked reduction in   the severity of mitral insufficiency.   Assessment:    1. Coronary artery disease of native artery of native heart with stable angina pectoris (HCC)   2. Pure hypercholesterolemia   3. Essential hypertension   4. Chronic systolic CHF (congestive heart failure) (HCC)   5. PAD (peripheral artery disease) (HCC)   6. Bilateral carotid artery occlusion  Plan:   In order of problems listed above:  1. Chronic Combined Systolic and Diastolic CHF/  Secondary to ischemic/tachycardia mediated Cardiomyopathy - EF 25-30% in 2018 with ischemic cardiomyopathy. Not a candidate for revascularization. With medical management EF improved to 50-55% by Echo.  - she is euvolemic at this time. Weight is down.  - continue BB, aldactone, and statin. ARB discontinued due to orthostatic dizziness and BP is stable. Will continue current therapy.   2. Paroxysmal Atrial Fibrillation/ multifocal atrial tachycardia - This patients CHA2DS2-VASc Score and unadjusted Ischemic Stroke Rate (% per year) is equal to 9.7 % stroke rate/year from a score of 6 (CHF, HTN, Female, Age, CVA (2)). She denies any evidence of active bleeding. Continue Coumadin for anticoagulation. Check INR today.  3. HTN- controlled.   4. COPD- - followed by Pulmonology.   5. PFO - no  plans for closure   6. PAD s/p covered stenting of left iliac. Stenosis in distal aorta and right femoral not amenable to percutaneous therapy. Left subclavian stenosis. Claudication is stable. Followed by Dr Allyson Sabal. Stable dopplers in May.  7. CAD. 100% RCA. 75% OM. Chronic stable angina class 1-2.  Recent anginal symptoms related to increased family stressors. We discussed adding a long acting nitrate but she would like to hold off on adding new meds now. Continue medical management. She is to let me know if anginal symptoms increase. If unrelieved with sl Ntg she is to call 911.   8. S/p right retinal artery occlusion. Plavix added to coumadin due to severe vascular disease. I feel benefit of Plavix/coumadin combination outweighs risk of bleeding at this time.  9. Old CVAs noted on MRI  10. Hypercholesterolemia. LDL 116 on maximally tolerated statin. Now on Praluent. LDL improved to 52.   11. Carotid arterial disease. No new Neurologic symptoms. Will update dopplers.     I will follow up in 4 months.   Signed, Loletta Harper [place], MD  10/11/2020 8:32 AM    [facility] [address] [place], [place] [address] Phone: [phone]

## 2020-10-11 ENCOUNTER — Ambulatory Visit (INDEPENDENT_AMBULATORY_CARE_PROVIDER_SITE_OTHER): Payer: Medicare Other

## 2020-10-11 ENCOUNTER — Ambulatory Visit: Payer: Medicare Other | Admitting: Cardiology

## 2020-10-11 ENCOUNTER — Encounter: Payer: Self-pay | Admitting: Cardiology

## 2020-10-11 ENCOUNTER — Other Ambulatory Visit: Payer: Self-pay

## 2020-10-11 ENCOUNTER — Ambulatory Visit (INDEPENDENT_AMBULATORY_CARE_PROVIDER_SITE_OTHER): Payer: Medicare Other | Admitting: Cardiology

## 2020-10-11 VITALS — BP 120/60 | HR 64 | Ht 64.0 in | Wt 186.0 lb

## 2020-10-11 DIAGNOSIS — E7800 Pure hypercholesterolemia, unspecified: Secondary | ICD-10-CM | POA: Diagnosis not present

## 2020-10-11 DIAGNOSIS — I6523 Occlusion and stenosis of bilateral carotid arteries: Secondary | ICD-10-CM

## 2020-10-11 DIAGNOSIS — I480 Paroxysmal atrial fibrillation: Secondary | ICD-10-CM

## 2020-10-11 DIAGNOSIS — I25118 Atherosclerotic heart disease of native coronary artery with other forms of angina pectoris: Secondary | ICD-10-CM | POA: Diagnosis not present

## 2020-10-11 DIAGNOSIS — Z7901 Long term (current) use of anticoagulants: Secondary | ICD-10-CM

## 2020-10-11 DIAGNOSIS — I5022 Chronic systolic (congestive) heart failure: Secondary | ICD-10-CM

## 2020-10-11 DIAGNOSIS — I10 Essential (primary) hypertension: Secondary | ICD-10-CM | POA: Diagnosis not present

## 2020-10-11 DIAGNOSIS — I739 Peripheral vascular disease, unspecified: Secondary | ICD-10-CM

## 2020-10-11 LAB — POCT INR: INR: 5.1 — AB (ref 2.0–3.0)

## 2020-10-11 NOTE — Patient Instructions (Signed)
Hold today and tomorrow and then decrease to 1/2 tablet daily.  Repeat INR in 4 weeks

## 2020-10-11 NOTE — Patient Instructions (Signed)
We will schedule you for carotid dopplers.  Continue your current therapy

## 2020-10-11 NOTE — Addendum Note (Signed)
Addended by: Kathyrn Lass on: 10/11/2020 08:59 AM   Modules accepted: Orders

## 2020-10-18 ENCOUNTER — Ambulatory Visit: Payer: Medicare Other | Admitting: Cardiovascular Disease

## 2020-10-21 ENCOUNTER — Other Ambulatory Visit: Payer: Self-pay | Admitting: Cardiology

## 2020-10-31 ENCOUNTER — Ambulatory Visit: Payer: Medicare Other | Admitting: Neurology

## 2020-11-04 ENCOUNTER — Ambulatory Visit (HOSPITAL_COMMUNITY): Payer: Medicare Other

## 2020-11-06 ENCOUNTER — Ambulatory Visit (INDEPENDENT_AMBULATORY_CARE_PROVIDER_SITE_OTHER): Payer: Medicare Other

## 2020-11-06 ENCOUNTER — Other Ambulatory Visit: Payer: Self-pay | Admitting: Internal Medicine

## 2020-11-06 ENCOUNTER — Ambulatory Visit (INDEPENDENT_AMBULATORY_CARE_PROVIDER_SITE_OTHER): Payer: Medicare Other | Admitting: Family Medicine

## 2020-11-06 ENCOUNTER — Encounter: Payer: Self-pay | Admitting: Family Medicine

## 2020-11-06 ENCOUNTER — Other Ambulatory Visit: Payer: Self-pay

## 2020-11-06 VITALS — BP 130/80 | HR 89 | Temp 98.7°F | Ht 64.0 in | Wt 186.0 lb

## 2020-11-06 DIAGNOSIS — I5022 Chronic systolic (congestive) heart failure: Secondary | ICD-10-CM

## 2020-11-06 DIAGNOSIS — J449 Chronic obstructive pulmonary disease, unspecified: Secondary | ICD-10-CM

## 2020-11-06 DIAGNOSIS — R059 Cough, unspecified: Secondary | ICD-10-CM

## 2020-11-06 DIAGNOSIS — R509 Fever, unspecified: Secondary | ICD-10-CM

## 2020-11-06 DIAGNOSIS — J9621 Acute and chronic respiratory failure with hypoxia: Secondary | ICD-10-CM

## 2020-11-06 DIAGNOSIS — J189 Pneumonia, unspecified organism: Secondary | ICD-10-CM

## 2020-11-06 DIAGNOSIS — J9611 Chronic respiratory failure with hypoxia: Secondary | ICD-10-CM

## 2020-11-06 DIAGNOSIS — I739 Peripheral vascular disease, unspecified: Secondary | ICD-10-CM

## 2020-11-06 LAB — POCT CBC
Granulocyte percent: 84.8 %G — AB (ref 37–80)
HCT, POC: 40.3 % (ref 29–41)
Hemoglobin: 13.3 g/dL (ref 11–14.6)
Lymph, poc: 1.2 (ref 0.6–3.4)
MCH, POC: 30.6 pg (ref 27–31.2)
MCHC: 33.1 g/dL (ref 31.8–35.4)
MCV: 92.4 fL (ref 76–111)
MID (cbc): 0.5 (ref 0–0.9)
MPV: 7.1 fL (ref 0–99.8)
POC Granulocyte: 8.6 — AB (ref 2–6.9)
POC LYMPH PERCENT: 10.4 %L (ref 10–50)
POC MID %: 4.8 %M (ref 0–12)
Platelet Count, POC: 303 10*3/uL (ref 142–424)
RBC: 4.36 M/uL (ref 4.04–5.48)
RDW, POC: 13.5 %
WBC: 10.2 10*3/uL (ref 4.6–10.2)

## 2020-11-06 MED ORDER — CLARITHROMYCIN 500 MG PO TABS: 500.0000 mg | ORAL_TABLET | Freq: Two times a day (BID) | ORAL | 0 refills | Status: DC

## 2020-11-06 MED ORDER — PREDNISONE 10 MG PO TABS: 10.0000 mg | ORAL_TABLET | Freq: Every day | ORAL | 0 refills | Status: DC

## 2020-11-06 NOTE — Patient Instructions (Addendum)
You do have a right middle lobe pneumonia seen on x-ray today.  This could be a postviral pneumonia either from flu, COVID or other virus that caused your symptoms in mid December.  As we have discussed I would recommend hospital evaluation for this pneumonia, please proceed there if you change your mind. For now can try Biaxin twice per day for 1 week, prednisone at same dose that was prescribed by pulmonary previously.  Continue nebulizer treatments.  Continue oxygen at home to keep levels above 90%.  If any worsening or new symptoms call 911 or proceed to the emergency room.  Recheck in 2 days.   Community-Acquired Pneumonia, Adult Pneumonia is a type of lung infection that causes swelling in the airways of the lungs. Mucus and fluid may also build up inside the airways. This may cause coughing and difficulty breathing. There are different types of pneumonia. One type can develop while a person is in a hospital. A different type is called community-acquired pneumonia. It develops in people who are not, and have not recently been, in the hospital or another type of health care facility. What are the causes? This condition may be caused by:  Viruses. This is the most common cause of pneumonia.  Bacteria. Community-acquired pneumonia is often caused by Streptococcus pneumoniae bacteria. These bacteria are often passed from one person to another by breathing in droplets from the cough or sneeze of an infected person.  Fungi. This is the least common cause of pneumonia. What increases the risk? The following factors may make you more likely to develop this condition:  Having a chronic disease, such as chronic obstructive pulmonary disease (COPD), asthma, congestive heart failure, cystic fibrosis, diabetes, or kidney disease.  Having early-stage or late-stage HIV.  Having sickle cell disease.  Having had your spleen removed (splenectomy).  Having poor dental hygiene.  Having a medical  condition that increases the risk of breathing in (aspirating) secretions from your own mouth and nose.  Having a weakened body defense system (immune system).  Being a smoker.  Traveling to areas where pneumonia-causing germs commonly exist.  Being around animal habitats or animals that have pneumonia-causing germs, including birds, bats, rabbits, cats, and farm animals. What are the signs or symptoms? Symptoms of this condition include:  A dry cough.  A wet (productive) cough.  Fever.  Sweating.  Chest pain, especially when breathing deeply or coughing.  Rapid breathing or difficulty breathing.  Shortness of breath.  Shaking chills.  Fatigue.  Muscle aches. How is this diagnosed? This condition may be diagnosed based on:  Your medical history.  A physical exam. You may also have tests, including:  Chest X-rays.  Tests of your blood oxygen level and other blood gases.  Tests on blood, mucus (sputum), fluid around your lungs (pleural fluid), and urine. If your pneumonia is severe, other tests may be done to find the exact cause of your illness. How is this treated? Treatment for this condition depends on many factors, such as the cause of your pneumonia, the medicines you take, and other medical conditions that you have. For most adults, treatment and recovery from pneumonia may occur at home. In some cases, treatment must happen in a hospital. Treatment may include:  Medicines that are given by mouth or through an IV, including: ? Antibiotic medicines, if the pneumonia was caused by bacteria. ? Antiviral medicines, if the pneumonia was caused by a virus.  Being given extra oxygen.  Respiratory therapy. Although rare, treating  severe pneumonia may include:  Using a machine to help you breathe (mechanical ventilation). This is done if you are not breathing well on your own and you cannot maintain a safe blood oxygen level.  Thoracentesis. This is a procedure  to remove fluid from around one lung or both lungs to help you breathe better. Follow these instructions at home:  Medicines  Take over-the-counter and prescription medicines only as told by your health care provider. ? Only take cough medicine if you are losing sleep. Be aware that cough medicine can prevent your body's natural ability to remove mucus from your lungs.  If you were prescribed an antibiotic medicine, take it as told by your health care provider. Do not stop taking the antibiotic even if you start to feel better. General instructions  Sleep in a semi-upright position at night. Try sleeping in a reclining chair, or place a few pillows under your head.  Rest as needed and get at least 8 hours of sleep each night.  Drink enough water to keep your urine pale yellow. This will help to thin out mucus secretions in your lungs.  Eat a healthy diet that includes plenty of vegetables, fruits, whole grains, low-fat dairy products, and lean protein.  Do not use any products that contain nicotine or tobacco, such as cigarettes, e-cigarettes, and chewing tobacco. If you need help quitting, ask your health care provider.  Keep all follow-up visits as told by your health care provider. This is important. How is this prevented? You can lower your risk of developing community-acquired pneumonia by:  Getting a pneumococcal vaccine. There are different types and schedules of pneumococcal vaccines. Ask your health care provider which option is best for you. Consider getting the vaccine if: ? You are older than 75 years of age. ? You are older than 75 years of age and are undergoing cancer treatment, have chronic lung disease, or have other medical conditions that affect your immune system. Ask your health care provider if this applies to you.  Getting an influenza vaccine every year. Ask your health care provider which type of vaccine is best for you.  Getting regular checkups from your  dentist.  Washing your hands often. If soap and water are not available, use hand sanitizer. Contact a health care provider if:  You have a fever.  You are losing sleep because you cannot control your cough with cough medicine. Get help right away if:  You have worsening shortness of breath.  You have increased chest pain.  Your sickness becomes worse, especially if you are an older adult or have a weakened immune system.  You cough up blood. Summary  Pneumonia is an infection of the lungs.  Community-acquired pneumonia develops in people who have not been in the hospital. It can be caused by bacteria, viruses, or fungi.  This condition may be treated with antibiotics or antiviral medicines.  Severe cases may require hospitalization, mechanical ventilation, and other procedures to drain fluid from the lungs. This information is not intended to replace advice given to you by your health care provider. Make sure you discuss any questions you have with your health care provider. Document Revised: 06/16/2018 Document Reviewed: 06/16/2018 Elsevier Patient Education  [employer].     If you have lab work done today you will be contacted with your lab results within the next 2 weeks.  If you have not heard from [place] then please contact us. The fastest way to get your results  is to register for My Chart.   IF you received an x-ray today, you will receive an invoice from [facility]. Please contact [facility] at [phone] with questions or concerns regarding your invoice.   IF you received labwork today, you will receive an invoice from [facility]. Please contact [facility] at [phone] with questions or concerns regarding your invoice.   Our billing staff will not be able to assist you with questions regarding bills from these companies.  You will be contacted with the lab results as soon as they are available. The fastest way to get your results is to  activate your My Chart account. Instructions are located on the last page of this paperwork. If you have not heard from [place] regarding the results in 2 weeks, please contact this office.

## 2020-11-06 NOTE — Progress Notes (Signed)
Subjective:  Patient ID: Laurie Horton, female    DOB: 11-02-1946  Age: 75 y.o. MRN: [id number]  CC:  Chief Complaint  Patient presents with  . Transitions Of Care    Pt reports she isn't feeling good. Pt reports fatigue, loss of apatite, loss of taste, loss of smell, and coughing up blood/flem. Pt is fasting. Pt reports she had a negative home covid rapid test.    HPI Laurie Horton presents for  Transition of care and discuss COPD.  Previous patient of Dr. Pamella Pert.  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  Cardiologist Dr. Martinique, last visit December 10.    Previous EF 25 to 30% in 2018 with ischemic cardiomyopathy, not a candidate for revascularization.  Medical management with EF improved to 50 to 55% by echo.  She was continued on beta-blocker, Aldactone, statin.  ARB discontinued due to orthostatic dizziness.  Continued on Coumadin for anticoagulation for her atrial fibrillation.  INR monitored by cardiology.  Status post stenting of her left iliac for PAD.  Stenosis in distal aorta and right femoral not amenable to percutaneous therapy.  Left subclavian stenosis.  Stable claudication.  Followed by Dr. Gwenlyn Found.  Chronic stable angina with her CAD, 100% of the RCA. Family stressors discussed at that time as contributor to anginal symptoms.  Long-acting nitrate mentioned but deferred.  Continue nitroglycerin as needed with 911 precautions.  Plavix has been added to Coumadin due to severe vascular disease previously, including retinal artery occlusion.  On Praluent for hyperlipidemia.  84-month follow-up.  Psychiatrist Dr. Adele Schilder, appointment August 26, 2020.  Neurology Dr. Leonie Man with appointment in June 2021, mild  cognitive impairment. Eye specialist Dr. Coralyn Pear with [facility].  She is followed by pain management for chronic pain disorder/fibromyalgia.  Treated with gabapentin.  COPD Pulmonary Dr. [name], last appointment in March 2021 Treated for ongoing mild flareup of COPD at that time with prednisone, Biaxin, and continued baseline nebulizers and chronic oxygen.  Covid vaccine was discussed at that time, history of multiple drug allergies including flu shot.  She has not had COVID vaccine. No flu vaccine - allergy.   Today reports recent fatigue, cough, decreased appetite, cough has been productive including hemoptysis at times, loss of taste, smell. Started with fever 100.2 on 12/15, chills, weakness, body aches. Other symptoms above.  Had negative covid rapid test on 12/20.  Thought was improving with soup, fluids, then worse around Christmas. More cough, with blood in mucus, discolored mucus. Was not seen for these symptoms. Still feels weak, possibly slightly better. Still discolored mucus but clearing. Foamy mucus, no further blood. No recent fever.  Dyspnea - chronic. Using 1.5 liters at bedtime, naptime or with exertion or days when feeling worse - using more often recently.  Tx: no otc meds.  appt with pulmonary 2/17.  Heaviness in chest - similar to COPD in past. No chest pain. Not needed NTG recently.  Still less appetite. Drinking fluids.  Not using O2 in office today.  States she is usually in high 80s on O2 sat. (O2 sat 93% on 05/16/20 visit, 95% on 01/17/20 pulmonary visit.   Has required nebs every 4 hours - some worsening ov COPD symptoms with current illness.   History Patient Active Problem List  Diagnosis Date Noted  . Leg pain, bilateral 08/06/2020  . 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 Pain in right shoulder 11/08/2019  . Multiple drug allergies 09/15/2019  . Pain in left shoulder 06/21/2019  . Subclavian artery  stenosis, left ([place]) 05/18/2019  . Claudication in peripheral vascular disease ([place]) 08/06/2017  . Class 1 obesity due to excess calories with serious comorbidity and body mass index (BMI) of 31.0 to 31.9 in adult 05/01/2017  . Chronic systolic CHF (congestive heart failure) ([place]) 03/04/2017  . Acute on chronic respiratory failure with hypoxia ([place]) 02/08/2017  . COPD, severe ([place]) 02/08/2017  . Acute diastolic heart failure, NYHA class 1 ([place]) 02/08/2017  . Fibromyalgia and chronic chest pain 02/08/2017  . Atrial fibrillation with RVR ([place]) 02/08/2017  . Acute heart failure ([place]) 02/08/2017  . Rapid atrial fibrillation ([place]) 02/08/2017  . Shortness of breath 02/08/2017  . Moderate episode of recurrent major depressive disorder ([place]) 01/04/2017  . Generalized anxiety disorder 01/04/2017  . Pure hypercholesterolemia 12/28/2016  . 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 Acute blood loss anemia 08/03/2016  . Carotid stenosis 04/02/2015  . Long term current use of anticoagulant therapy 09/26/2014  . PAF (paroxysmal atrial fibrillation) ([place]) 09/19/2014  . 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 Chronic respiratory failure with hypoxia ([place]) 06/27/2014  . 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 Idiopathic scoliosis and kyphoscoliosis 07/19/2013  . Hyperglycemia, drug-induced 10/12/2011  . Encounter for long-term (current) use of medications 08/21/2011  . Thrush 07/24/2011  . PULMONARY NODULE, LEFT LOWER LOBE 10/14/2009  . PATENT FORAMEN OVALE 10/14/2009  . Essential hypertension 06/04/2009  . G E R D 06/04/2009  . SNORING, HX OF 06/04/2009   Past Medical History:  Diagnosis Date  . Cataract    OD  . CHF (congestive heart failure) ([place]) 03/02/2017   EF 25-30% 2018  . Complication of anesthesia    various issues with oxygen  saturations post op  . COPD (chronic obstructive pulmonary disease) ([place])   . Depression   .  Depression    Phreesia 11/04/2020  . Diverticulitis   . DVT (deep venous thrombosis) ([place])   . Fibromyalgia   . GERD (gastroesophageal reflux disease)   . Heart murmur    Phreesia 11/04/2020  . Hiatal hernia   . History of deviated nasal septum    left- side  . HTN (hypertension)   . Hyperlipidemia   . Hypertension    Phreesia 11/04/2020  . Hypertensive retinopathy    OU  . Myocardial infarction ([place])    Phreesia 11/04/2020  . Obesity   . On supplemental oxygen therapy    concentrator at night @ 1.5 l/m or when sleeps. O2 Sat niormally 87.  . Osteoporosis   . Osteoporosis    Phreesia 11/04/2020  . Oxygen deficiency    Phreesia 11/04/2020  . PAT (paroxysmal atrial tachycardia) ([place])   . PFO (patent foramen ovale)   . PULMONARY NODULE, LEFT LOWER LOBE 10/14/2009   100mm LLL nodule dec 2010. Stable and 13mm in Oct 2012. No further fu  . PVD (peripheral vascular disease) with claudication ([place]) 12/2017  . Right middle lobe pneumonia 07/24/2011   First noted at admit 07/10/11. Persists on cxr 07/22/11. Cleared on CT 08/24/11. No further followup  . Stroke ([place])   . TOBACCO ABUSE 06/04/2009   Past Surgical History:  Procedure Laterality Date  . ABDOMINAL HYSTERECTOMY N/A  Phreesia 11/04/2020  . CARDIOVASCULAR STRESS TEST  12/26/2004   EF 74%. NO EVIDENCE OF ISCHEMIA  . CATARACT EXTRACTION Left    Dr. Elliot Dally  . ESOPHAGOGASTRODUODENOSCOPY (EGD) WITH PROPOFOL N/A 04/15/2015   Procedure: ESOPHAGOGASTRODUODENOSCOPY (EGD) WITH PROPOFOL;  Surgeon: Laurence Spates, MD;  Location: [facility];  Service: Endoscopy;  Laterality: N/A;  . EYE SURGERY Left    Cat Sx  . JOINT REPLACEMENT N/A    Phreesia 11/04/2020  . KNEE ARTHROSCOPY  2000   left  . LAPAROSCOPIC CHOLECYSTECTOMY  04-16-2010   [place]  . LOWER EXTREMITY ANGIOGRAPHY N/A 09/09/2017   Procedure: Lower Extremity Angiography;  Surgeon: Lorretta Harp, MD;  Location: [facility] [facility];  Service: Cardiovascular;  Laterality: N/A;   . LOWER EXTREMITY INTERVENTION Left 01/17/2018   Procedure: LOWER EXTREMITY INTERVENTION;  Surgeon: Lorretta Harp, MD;  Location: [place] [facility];  Service: Cardiovascular;  Laterality: Left;  . MOUTH SURGERY     03-26-15 multiple extractions stitches remains  . PERIPHERAL VASCULAR INTERVENTION Left 01/17/2018   Procedure: PERIPHERAL VASCULAR INTERVENTION;  Surgeon: Lorretta Harp, MD;  Location: [place] [facility];  Service: Cardiovascular;  Laterality: Left;  COMMON ILIAC  . RIGHT/LEFT HEART CATH AND CORONARY ANGIOGRAPHY N/A 03/04/2017   Procedure: Right/Left Heart Cath and Coronary Angiography;  Surgeon: Peter M Martinique, MD;  Location: [facility];  Service: Cardiovascular;  Laterality: N/A;  . TOTAL ABDOMINAL HYSTERECTOMY     post op needed oxygen was told "she gave them a scare"  . TUBAL LIGATION    . US ECHOCARDIOGRAPHY  11/20/2009   EF 55-60%   Allergies  Allergen Reactions  . Alprazolam Anaphylaxis and Other (See Comments)    REACTION: stops breathing  . Bee Venom Anaphylaxis  . Iodine Anaphylaxis, Swelling and Other (See Comments)    REACTION: swelling in throat  . Pseudoephedrine Hcl Er Shortness Of Breath  . Budesonide-Formoterol Fumarate Other (See Comments)    Blisters inside of mouth all over  . Crestor [Rosuvastatin Calcium] Other (See Comments)    Unable to walk  . Esomeprazole Magnesium Other (See Comments)    REACTION: "bouncing off walls"  . Flonase [Fluticasone Propionate] Other (See Comments)    NOSE BLEED  . Lamictal [Lamotrigine] Rash    Patient got rash, labored breathing, and diarrhea  . Loratadine Other (See Comments)    claritin D causes shaking  . Lotrimin [Clotrimazole] Other (See Comments)    Mouth blisters  . Lunesta [Eszopiclone] Other (See Comments)    REACTION: "slept for a week"  . Oxcarbazepine Other (See Comments)    Causes deep sleep and dizziness  . Statins Other (See Comments)    Can't walk, legs won't work   . Zolpidem  Tartrate Other (See Comments)    REACTION: "slept for a week"  . Betadine [Povidone Iodine] Other (See Comments)    Breathing problems  . Bevespi Aerosphere [Glycopyrrolate-Formoterol] Other (See Comments)    Pt believes this caused mouth sores and thrush   . Clarithromycin Other (See Comments)    All "mycins", Puts into "a" fib, Will take if has to for severe sinus infection  . Effexor [Venlafaxine] Nausea And Vomiting and Other (See Comments)    cramps  . Lexapro [Escitalopram Oxalate] Other (See Comments)    hallucinations  . Aciphex [Rabeprazole Sodium] Rash  . Alendronate Sodium Other (See Comments)    "caused stomach problems for 3 days"  . Avelox [Moxifloxacin Hcl In Nacl] Other (See  Comments)    Stomach cramps.   Marlowe Aschoff [Valdecoxib] Rash  . Ceclor [Cefaclor] Rash  . Cephalexin Rash and Other (See Comments)    Pt states that she is possibly allergic to this - had a reaction to Cefaclor in the past and she does not want to these class drugs. Added per patient request.  . Covera-Hs [Verapamil Hcl] Palpitations  . Dicyclomine Hcl Rash  . Other Other (See Comments)    Glue from ekg/heart monitor leads --rash, Any MYCINS  . Tessalon Perles Rash   Prior to Admission medications   Medication Sig Start Date End Date Taking? Authorizing Provider  furosemide (LASIX) 40 MG tablet TAKE 1 TABLET BY MOUTH EVERY DAY 10/21/20   [place], Peter M, MD  Alirocumab (PRALUENT) 75 MG/ML SOAJ Inject 75 mg into the skin every 14 (fourteen) days. 08/26/20   [place], Peter M, MD  carvedilol (COREG) 12.5 MG tablet TAKE 1 TABLET BY MOUTH 2 TIMES DAILY. 09/12/20   [place], Peter M, MD  clopidogrel (PLAVIX) 75 MG tablet TAKE 1 TABLET BY MOUTH EVERY DAY 05/16/20   [place], Peter M, MD  desvenlafaxine (PRISTIQ) 50 MG 24 hr tablet Take 1 tablet (50 mg total) by mouth daily. 08/26/20   Arfeen, Phillips Grout, MD  diazepam (VALIUM) 2 MG tablet Take 1 tablet (2 mg total) by mouth as needed for anxiety. 09/16/20 09/16/21   Arfeen, Phillips Grout, MD  diclofenac sodium (VOLTAREN) 1 % GEL APPLY 2 GRAMS TO BOTH HANDS 4 TIMES A DAY 06/21/18   [provider]  Eyelid Cleansers (OCUSOFT BABY EYELID & EYELASH EX) Apply topically.    [provider]  gabapentin (NEURONTIN) 100 MG capsule Take 200 mg by mouth See admin instructions. Take 1 in am and 1 in the lunch and 2 at bedtime 09/16/16   [provider]  HYDROcodone-acetaminophen (NORCO/VICODIN) 5-325 MG tablet Take 1 tablet by mouth 3 (three) times daily.    [provider]  ipratropium (ATROVENT) 0.02 % nebulizer solution USE 1 VIAL BY NEBULIZATION 4 (FOUR) TIMES DAILY. 10/17/19   [name], MD  montelukast (SINGULAIR) 10 MG tablet TAKE 1 TABLET BY MOUTH EVERYDAY AT BEDTIME 05/12/20   Lezlie Lye, Meda Coffee, MD  nitroGLYCERIN (NITROSTAT) 0.4 MG SL tablet Place 1 tablet (0.4 mg total) under the tongue every 5 (five) minutes as needed for chest pain. 06/17/20 09/15/20  [place], Peter M, MD  Omega-3 Fatty Acids (FISH OIL) 1000 MG CAPS Take by mouth daily.    [provider]  ondansetron (ZOFRAN-ODT) 8 MG disintegrating tablet TAKE 1 TABLET BY MOUTH EVERY 8 HOURS AS NEEDED NAUSEA OR VOMITING 12/04/19   Lezlie Lye, Meda Coffee, MD  oxybutynin (DITROPAN-XL) 10 MG 24 hr tablet TAKE 1 TABLET BY MOUTH EVERY DAY 05/12/20   Lezlie Lye, Meda Coffee, MD  OXYGEN Inhale 1.5-2 L into the lungs as needed (for shortness of breath).    [provider]  pantoprazole (PROTONIX) 40 MG tablet TAKE 1 TABLET BY MOUTH EVERY DAY 05/25/20   Lezlie Lye, Meda Coffee, MD  polyethylene glycol [facility] / Ethelene Hal) packet Take 17 g by mouth daily as needed for mild constipation.     [provider]  spironolactone (ALDACTONE) 25 MG tablet TAKE 1 TABLET BY MOUTH EVERY DAY 02/07/20   [place], Peter M, MD  tiZANidine (ZANAFLEX) 2 MG tablet Take 2 mg by mouth at bedtime. Take one in noon and 1 bedtime    [provider]  traZODone (DESYREL) 100 MG  tablet  Take one tab daily as needed for sleep 08/26/20   Arfeen, Arlyce Harman, MD  triamcinolone cream (KENALOG) 0.1 % SMARTSIG:1 Sparingly Topical Twice Daily PRN 04/18/20   [provider]  warfarin (COUMADIN) 5 MG tablet TAKE 1/2 TO 1 TABLET BY MOUTH EVERY DAY OR AS DIRECTED BY COUMADIN CLINIC 06/03/20   Martinique, Peter M, MD   Social History   Socioeconomic History  . Marital status: Divorced    Spouse name: Not on file  . Number of children: 2  . Years of education: Not on file  . Highest education level: Not on file  Occupational History  . Occupation: disability  Tobacco Use  . Smoking status: Former Smoker    Packs/day: 3.00    Years: 30.00    Pack years: 90.00    Types: Cigarettes    Quit date: 06/02/2010    Years since quitting: 10.4  . Smokeless tobacco: Never Used  . Tobacco comment: QUIT IN 2011  Vaping Use  . Vaping Use: Never used  Substance and Sexual Activity  . Alcohol use: No    Alcohol/week: 0.0 standard drinks  . Drug use: No  . Sexual activity: Yes    Partners: Male    Birth control/protection: Post-menopausal, Surgical  Other Topics Concern  . Not on file  Social History Narrative   Marital status: divorced in 1978 after ten years; dating casually in 2019.      Children: 3 biological children; 3 court appointed children; 6 grandchildren; 5 gg      Lives: alone; children in [place] and [place].      Employment: retired age 1; disability for COPD, CVA at age 18.      Tobacco: former smoker; quit smoking 2011.      Alcohol: none      Exercise: walks dog four times per day; goes to pool three times per week.      ADLs: drives; no assistant devices; does have a walker.  Cleaning is limited in 2018.  Daughter helps with cleaning.  Does own grocery shopping.      Advanced Directives: YES; DNR/DNI; HCPOA: Rolan Lipa Martin/daughter youngest.  Blind.               Social Determinants of Health   Financial Resource Strain: Not on file  Food Insecurity:  Not on file  Transportation Needs: Not on file  Physical Activity: Not on file  Stress: Not on file  Social Connections: Not on file  Intimate Partner Violence: Not on file    Review of Systems Per HPI.   Objective:   Vitals:   11/06/20 0807 11/06/20 0914  BP: 130/80   Pulse: 89   Temp: 98.7 F (37.1 C)   TempSrc: Temporal   SpO2: (!) 84% 92%  Weight: 186 lb (84.4 kg)   Height: 5\' 4"  (1.626 m)   repeat O2 sat 92% on 1 liter O2NC  Physical Exam Vitals reviewed.  Constitutional:      Appearance: She is well-developed and well-nourished.  HENT:     Head: Normocephalic and atraumatic.  Eyes:     Extraocular Movements: EOM normal.     Conjunctiva/sclera: Conjunctivae normal.     Pupils: Pupils are equal, round, and reactive to light.  Neck:     Vascular: No carotid bruit.  Cardiovascular:     Rate and Rhythm: Normal rate. Rhythm irregular.     Pulses: Intact distal pulses.     Heart sounds: Normal heart sounds.  Comments: Irregular, but regular rate.  Pulmonary:     Effort: Pulmonary effort is normal.     Breath sounds: Normal breath sounds. No wheezing (distant, but no audible wheeze. ) or rales.  Abdominal:     Palpations: Abdomen is soft. There is no pulsatile mass.     Tenderness: There is no abdominal tenderness.  Skin:    General: Skin is warm and dry.  Neurological:     General: No focal deficit present.     Mental Status: She is alert and oriented to person, place, and time.  Psychiatric:        Mood and Affect: Mood and affect and mood normal.        Behavior: Behavior normal.        Thought Content: Thought content normal.    Results for orders placed or performed in visit on 11/06/20  POCT CBC  Result Value Ref Range   WBC 10.2 4.6 - 10.2 K/uL   Lymph, poc 1.2 0.6 - 3.4   POC LYMPH PERCENT 10.4 10 - 50 %L   MID (cbc) 0.5 0 - 0.9   POC MID % 4.8 0 - 12 %M   POC Granulocyte 8.6 (A) 2 - 6.9   Granulocyte percent 84.8 (A) 37 - 80 %G   RBC 4.36  4.04 - 5.48 M/uL   Hemoglobin 13.3 11 - 14.6 g/dL   HCT, POC 40.3 29 - 41 %   MCV 92.4 76 - 111 fL   MCH, POC 30.6 27 - 31.2 pg   MCHC 33.1 31.8 - 35.4 g/dL   RDW, POC 13.5 %   Platelet Count, POC 303 142 - 424 K/uL   MPV 7.1 0 - 99.8 fL   DG Chest 2 View  Result Date: 11/06/2020 CLINICAL DATA:  Cough and fever.  COPD. EXAM: CHEST - 2 VIEW COMPARISON:  02/11/2017 FINDINGS: Cardiomegaly and changes of COPD are again noted. Aortic atherosclerotic calcification noted. New patchy airspace opacity is seen in the right midlung, suspicious for pneumonia. Left lung remains clear. Mild right pleural thickening versus tiny right pleural effusion noted. IMPRESSION: New patchy airspace opacity in right midlung, suspicious for pneumonia. Stable cardiomegaly and COPD. Electronically Signed   By: Marlaine Hind M.D.   On: 11/06/2020 09:07   55 minutes spent during visit, greater than 50% counseling and assimilation of information, chart review, and discussion of plan.    Assessment & Plan:  SEBRENIA ARCEMENT is a 75 y.o. female . COPD, severe ([place]) - Plan: DG Chest 2 View, POCT CBC  Cough - Plan: DG Chest 2 View, POCT CBC  Fever, unspecified fever cause  Acute on chronic respiratory failure with hypoxia (HCC) - Plan: POCT CBC  Claudication in peripheral vascular disease (HCC)  Chronic systolic CHF (congestive heart failure) (HCC)  Pneumonia of right middle lobe due to infectious organism - Plan: clarithromycin (BIAXIN) 500 MG tablet  New onset right middle lobe infiltrate/pneumonia in the setting of previous chronic respiratory failure, CHF, COPD. Early symptoms suspicious for Covid but now approximately 21 days out from onset of symptoms and negative rapid test 5 days into symptoms Does report some slight improvement of symptoms recently but is hypoxic on presentation of her oxygen.  Does report home oxygen is in the high 80s but again in office testing previously in the low 90s. Pneumonia severity  index age 59-10 (female) =64, +10 for heart failure, +10 for cerebrovascular disease with prior CVA. Total current score of 84,  class III.  With her multiple comorbidities and multiple allergies including allergy to quinolones, has tolerated biaxin in past - GI upset only, but requests trial of this again. I recommended hospital evaluation for possible observation vs admission by PSI score and her comorbidities and possible different antibiotic treatment. She refuses hospital evaluation or ER evaluation. She is aware of risks including but not limited to worsening illness or death. Will try Biaxin initially. 500mg  BID for 7 days.  continue oxygen 1-2 liters to keep O2 above 90.  Prednisone same dose prescribed previously by pulmonary for possible COPD flare component. Recheck in 48 hrs with 911/ ER precautions if any worsening sooner. Understanding expressed.   Meds ordered this encounter  Medications  . clarithromycin (BIAXIN) 500 MG tablet    Sig: Take 1 tablet (500 mg total) by mouth 2 (two) times daily.    Dispense:  14 tablet    Refill:  0  . predniSONE (DELTASONE) 10 MG tablet    Sig: Take 1 tablet (10 mg total) by mouth daily with breakfast. 4 po qd for 1 day, then 3po qd for 1 day, 2 po qd for 1 day, 1po qd for 1 day, 1/2 po qd for 2 days.    Dispense:  11 tablet    Refill:  0   Patient Instructions   You do have a right middle lobe pneumonia seen on x-ray today.  This could be a postviral pneumonia either from flu, COVID or other virus that caused your symptoms in mid December.  As we have discussed I would recommend hospital evaluation for this pneumonia, please proceed there if you change your mind. For now can try Biaxin twice per day for 1 week, prednisone at same dose that was prescribed by pulmonary previously.  Continue nebulizer treatments.  Continue oxygen at home to keep levels above 90%.  If any worsening or new symptoms call 911 or proceed to the emergency room.  Recheck in 2  days.   Community-Acquired Pneumonia, Adult Pneumonia is a type of lung infection that causes swelling in the airways of the lungs. Mucus and fluid may also build up inside the airways. This may cause coughing and difficulty breathing. There are different types of pneumonia. One type can develop while a person is in a hospital. A different type is called community-acquired pneumonia. It develops in people who are not, and have not recently been, in the hospital or another type of health care facility. What are the causes? This condition may be caused by:  Viruses. This is the most common cause of pneumonia.  Bacteria. Community-acquired pneumonia is often caused by Streptococcus pneumoniae bacteria. These bacteria are often passed from one person to another by breathing in droplets from the cough or sneeze of an infected person.  Fungi. This is the least common cause of pneumonia. What increases the risk? The following factors may make you more likely to develop this condition:  Having a chronic disease, such as chronic obstructive pulmonary disease (COPD), asthma, congestive heart failure, cystic fibrosis, diabetes, or kidney disease.  Having early-stage or late-stage HIV.  Having sickle cell disease.  Having had your spleen removed (splenectomy).  Having poor dental hygiene.  Having a medical condition that increases the risk of breathing in (aspirating) secretions from your own mouth and nose.  Having a weakened body defense system (immune system).  Being a smoker.  Traveling to areas where pneumonia-causing germs commonly exist.  Being around animal habitats or animals that have  pneumonia-causing germs, including birds, bats, rabbits, cats, and farm animals. What are the signs or symptoms? Symptoms of this condition include:  A dry cough.  A wet (productive) cough.  Fever.  Sweating.  Chest pain, especially when breathing deeply or coughing.  Rapid breathing or  difficulty breathing.  Shortness of breath.  Shaking chills.  Fatigue.  Muscle aches. How is this diagnosed? This condition may be diagnosed based on:  Your medical history.  A physical exam. You may also have tests, including:  Chest X-rays.  Tests of your blood oxygen level and other blood gases.  Tests on blood, mucus (sputum), fluid around your lungs (pleural fluid), and urine. If your pneumonia is severe, other tests may be done to find the exact cause of your illness. How is this treated? Treatment for this condition depends on many factors, such as the cause of your pneumonia, the medicines you take, and other medical conditions that you have. For most adults, treatment and recovery from pneumonia may occur at home. In some cases, treatment must happen in a hospital. Treatment may include:  Medicines that are given by mouth or through an IV, including: ? Antibiotic medicines, if the pneumonia was caused by bacteria. ? Antiviral medicines, if the pneumonia was caused by a virus.  Being given extra oxygen.  Respiratory therapy. Although rare, treating severe pneumonia may include:  Using a machine to help you breathe (mechanical ventilation). This is done if you are not breathing well on your own and you cannot maintain a safe blood oxygen level.  Thoracentesis. This is a procedure to remove fluid from around one lung or both lungs to help you breathe better. Follow these instructions at home:  Medicines  Take over-the-counter and prescription medicines only as told by your health care provider. ? Only take cough medicine if you are losing sleep. Be aware that cough medicine can prevent your body's natural ability to remove mucus from your lungs.  If you were prescribed an antibiotic medicine, take it as told by your health care provider. Do not stop taking the antibiotic even if you start to feel better. General instructions  Sleep in a semi-upright position at  night. Try sleeping in a reclining chair, or place a few pillows under your head.  Rest as needed and get at least 8 hours of sleep each night.  Drink enough water to keep your urine pale yellow. This will help to thin out mucus secretions in your lungs.  Eat a healthy diet that includes plenty of vegetables, fruits, whole grains, low-fat dairy products, and lean protein.  Do not use any products that contain nicotine or tobacco, such as cigarettes, e-cigarettes, and chewing tobacco. If you need help quitting, ask your health care provider.  Keep all follow-up visits as told by your health care provider. This is important. How is this prevented? You can lower your risk of developing community-acquired pneumonia by:  Getting a pneumococcal vaccine. There are different types and schedules of pneumococcal vaccines. Ask your health care provider which option is best for you. Consider getting the vaccine if: ? You are older than 75 years of age. ? You are older than 75 years of age and are undergoing cancer treatment, have chronic lung disease, or have other medical conditions that affect your immune system. Ask your health care provider if this applies to you.  Getting an influenza vaccine every year. Ask your health care provider which type of vaccine is best for you.  Getting  regular checkups from your dentist.  Washing your hands often. If soap and water are not available, use hand sanitizer. Contact a health care provider if:  You have a fever.  You are losing sleep because you cannot control your cough with cough medicine. Get help right away if:  You have worsening shortness of breath.  You have increased chest pain.  Your sickness becomes worse, especially if you are an older adult or have a weakened immune system.  You cough up blood. Summary  Pneumonia is an infection of the lungs.  Community-acquired pneumonia develops in people who have not been in the hospital. It can  be caused by bacteria, viruses, or fungi.  This condition may be treated with antibiotics or antiviral medicines.  Severe cases may require hospitalization, mechanical ventilation, and other procedures to drain fluid from the lungs. This information is not intended to replace advice given to you by your health care provider. Make sure you discuss any questions you have with your health care provider. Document Revised: 06/16/2018 Document Reviewed: 06/16/2018 Elsevier Patient Education  [employer].     If you have lab work done today you will be contacted with your lab results within the next 2 weeks.  If you have not heard from [place] then please contact us. The fastest way to get your results is to register for My Chart.   IF you received an x-ray today, you will receive an invoice from [facility]. Please contact [facility] at [phone] with questions or concerns regarding your invoice.   IF you received labwork today, you will receive an invoice from [place]. Please contact [facility] at [phone] with questions or concerns regarding your invoice.   Our billing staff will not be able to assist you with questions regarding bills from these companies.  You will be contacted with the lab results as soon as they are available. The fastest way to get your results is to activate your My Chart account. Instructions are located on the last page of this paperwork. If you have not heard from [place] regarding the results in 2 weeks, please contact this office.         Signed, Merri Ray, MD [facility]

## 2020-11-08 ENCOUNTER — Ambulatory Visit (INDEPENDENT_AMBULATORY_CARE_PROVIDER_SITE_OTHER): Payer: Medicare Other | Admitting: Family Medicine

## 2020-11-08 ENCOUNTER — Other Ambulatory Visit: Payer: Self-pay

## 2020-11-08 ENCOUNTER — Encounter: Payer: Self-pay | Admitting: Family Medicine

## 2020-11-08 VITALS — BP 142/88 | HR 81 | Temp 98.3°F | Ht 64.0 in | Wt 185.0 lb

## 2020-11-08 DIAGNOSIS — J189 Pneumonia, unspecified organism: Secondary | ICD-10-CM | POA: Diagnosis not present

## 2020-11-08 DIAGNOSIS — I4891 Unspecified atrial fibrillation: Secondary | ICD-10-CM | POA: Diagnosis not present

## 2020-11-08 DIAGNOSIS — J9621 Acute and chronic respiratory failure with hypoxia: Secondary | ICD-10-CM

## 2020-11-08 NOTE — Patient Instructions (Addendum)
I am glad that you are improving.  Continue same medications for now, but if any increasing shortness of breath, fevers, worsening fatigue or any worsening symptoms be seen in the emergency room or urgent care.  If you are continuing to improve, follow-up with me in 5 days.  Let me know if there are questions in the meantime.  I will check some blood work today including the INR at the request of your cardiologist office.   Community-Acquired Pneumonia, Adult Pneumonia is a type of lung infection that causes swelling in the airways of the lungs. Mucus and fluid may also build up inside the airways. This may cause coughing and difficulty breathing. There are different types of pneumonia. One type can develop while a person is in a hospital. A different type is called community-acquired pneumonia. It develops in people who are not, and have not recently been, in the hospital or another type of health care facility. What are the causes? This condition may be caused by:  Viruses. This is the most common cause of pneumonia.  Bacteria. Community-acquired pneumonia is often caused by Streptococcus pneumoniae bacteria. These bacteria are often passed from one person to another by breathing in droplets from the cough or sneeze of an infected person.  Fungi. This is the least common cause of pneumonia. What increases the risk? The following factors may make you more likely to develop this condition:  Having a chronic disease, such as chronic obstructive pulmonary disease (COPD), asthma, congestive heart failure, cystic fibrosis, diabetes, or kidney disease.  Having early-stage or late-stage HIV.  Having sickle cell disease.  Having had your spleen removed (splenectomy).  Having poor dental hygiene.  Having a medical condition that increases the risk of breathing in (aspirating) secretions from your own mouth and nose.  Having a weakened body defense system (immune system).  Being a  smoker.  Traveling to areas where pneumonia-causing germs commonly exist.  Being around animal habitats or animals that have pneumonia-causing germs, including birds, bats, rabbits, cats, and farm animals. What are the signs or symptoms? Symptoms of this condition include:  A dry cough.  A wet (productive) cough.  Fever.  Sweating.  Chest pain, especially when breathing deeply or coughing.  Rapid breathing or difficulty breathing.  Shortness of breath.  Shaking chills.  Fatigue.  Muscle aches. How is this diagnosed? This condition may be diagnosed based on:  Your medical history.  A physical exam. You may also have tests, including:  Chest X-rays.  Tests of your blood oxygen level and other blood gases.  Tests on blood, mucus (sputum), fluid around your lungs (pleural fluid), and urine. If your pneumonia is severe, other tests may be done to find the exact cause of your illness. How is this treated? Treatment for this condition depends on many factors, such as the cause of your pneumonia, the medicines you take, and other medical conditions that you have. For most adults, treatment and recovery from pneumonia may occur at home. In some cases, treatment must happen in a hospital. Treatment may include:  Medicines that are given by mouth or through an IV, including: ? Antibiotic medicines, if the pneumonia was caused by bacteria. ? Antiviral medicines, if the pneumonia was caused by a virus.  Being given extra oxygen.  Respiratory therapy. Although rare, treating severe pneumonia may include:  Using a machine to help you breathe (mechanical ventilation). This is done if you are not breathing well on your own and you cannot maintain a  safe blood oxygen level.  Thoracentesis. This is a procedure to remove fluid from around one lung or both lungs to help you breathe better. Follow these instructions at home:  Medicines  Take over-the-counter and prescription  medicines only as told by your health care provider. ? Only take cough medicine if you are losing sleep. Be aware that cough medicine can prevent your body's natural ability to remove mucus from your lungs.  If you were prescribed an antibiotic medicine, take it as told by your health care provider. Do not stop taking the antibiotic even if you start to feel better. General instructions  Sleep in a semi-upright position at night. Try sleeping in a reclining chair, or place a few pillows under your head.  Rest as needed and get at least 8 hours of sleep each night.  Drink enough water to keep your urine pale yellow. This will help to thin out mucus secretions in your lungs.  Eat a healthy diet that includes plenty of vegetables, fruits, whole grains, low-fat dairy products, and lean protein.  Do not use any products that contain nicotine or tobacco, such as cigarettes, e-cigarettes, and chewing tobacco. If you need help quitting, ask your health care provider.  Keep all follow-up visits as told by your health care provider. This is important. How is this prevented? You can lower your risk of developing community-acquired pneumonia by:  Getting a pneumococcal vaccine. There are different types and schedules of pneumococcal vaccines. Ask your health care provider which option is best for you. Consider getting the vaccine if: ? You are older than 75 years of age. ? You are older than 75 years of age and are undergoing cancer treatment, have chronic lung disease, or have other medical conditions that affect your immune system. Ask your health care provider if this applies to you.  Getting an influenza vaccine every year. Ask your health care provider which type of vaccine is best for you.  Getting regular checkups from your dentist.  Washing your hands often. If soap and water are not available, use hand sanitizer. Contact a health care provider if:  You have a fever.  You are losing  sleep because you cannot control your cough with cough medicine. Get help right away if:  You have worsening shortness of breath.  You have increased chest pain.  Your sickness becomes worse, especially if you are an older adult or have a weakened immune system.  You cough up blood. Summary  Pneumonia is an infection of the lungs.  Community-acquired pneumonia develops in people who have not been in the hospital. It can be caused by bacteria, viruses, or fungi.  This condition may be treated with antibiotics or antiviral medicines.  Severe cases may require hospitalization, mechanical ventilation, and other procedures to drain fluid from the lungs. This information is not intended to replace advice given to you by your health care provider. Make sure you discuss any questions you have with your health care provider. Document Revised: 06/16/2018 Document Reviewed: 06/16/2018 Elsevier Patient Education  [employer].    If you have lab work done today you will be contacted with your lab results within the next 2 weeks.  If you have not heard from [place] then please contact us. The fastest way to get your results is to register for My Chart.   IF you received an x-ray today, you will receive an invoice from [facility]. Please contact [facility] at [phone] with questions or concerns  regarding your invoice.   IF you received labwork today, you will receive an invoice from [place]. Please contact [facility] at [phone] with questions or concerns regarding your invoice.   Our billing staff will not be able to assist you with questions regarding bills from these companies.  You will be contacted with the lab results as soon as they are available. The fastest way to get your results is to activate your My Chart account. Instructions are located on the last page of this paperwork. If you have not heard from [place] regarding the results in 2 weeks, please contact this  office.

## 2020-11-08 NOTE — Progress Notes (Signed)
Subjective:  Patient ID: Laurie Horton, female    DOB: 03-18-1946  Age: 75 y.o. MRN: [id number]  CC:  Chief Complaint  Patient presents with  . Pneumonia    Pt reports since last OV  she feels a little better other than some tightness in the chest when laying down. Pt states she was told to get an INR test today due to the medication.    HPI Laurie Horton presents for   Follow-up of right middle lobe infiltrate/pneumonia in the setting of previous chronic respiratory failure, CHF,  COPD- stable cardiomegaly, COPD.  Evaluated 2 days ago, recommended hospital eval/ER eval for possible hospitalization given her current medical history as well as relative hypoxia.  Hospitalization was refused.  Agreed to initially treat outpatient with clarithromycin, prednisone with ER precautions.   Taking clarithromycin and prednisone. No new side effects. Stomach cramps eased up. No vomiting. Mucus has become lighter.  Feels weak, but better since last visit. Feeling better. No fever.  wheezing improved., still some tightness but better. Able to remain on 1.5 liters O2 [employer] at home. Not measuring O2 sats.  Drinking fluids ok. Still some decreased appetite.  Only occasional cough now.   INR requested by anticoag clinic as on prednisone.    History Patient Active Problem List   Diagnosis Date Noted  . Leg pain, bilateral 08/06/2020  . 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 Pain in right shoulder 11/08/2019  . Multiple drug allergies 09/15/2019  . Pain in left shoulder 06/21/2019  . Subclavian artery stenosis, left ([place]) 05/18/2019  . Claudication in peripheral vascular disease ([place]) 08/06/2017  . Class 1 obesity due to excess calories with serious comorbidity and body mass index (BMI) of 31.0 to 31.9 in adult 05/01/2017  . Chronic systolic CHF (congestive heart failure) ([place]) 03/04/2017  . Acute on chronic respiratory failure with hypoxia ([place]) 02/08/2017  . COPD,  severe ([place]) 02/08/2017  . Acute diastolic heart failure, NYHA class 1 ([place]) 02/08/2017  . Fibromyalgia and chronic chest pain 02/08/2017  . Atrial fibrillation with RVR ([place]) 02/08/2017  . Acute heart failure ([place]) 02/08/2017  . Rapid atrial fibrillation ([place]) 02/08/2017  . Shortness of breath 02/08/2017  . Moderate episode of recurrent major depressive disorder ([place]) 01/04/2017  . Generalized anxiety disorder 01/04/2017  . Pure hypercholesterolemia 12/28/2016  . 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 Acute blood loss anemia 08/03/2016  . Carotid stenosis 04/02/2015  . Long term current use of anticoagulant therapy 09/26/2014  . PAF (paroxysmal atrial fibrillation) ([place]) 09/19/2014  . 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 Chronic respiratory failure with hypoxia ([place]) 06/27/2014  . 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 Idiopathic scoliosis and kyphoscoliosis 07/19/2013  . Hyperglycemia, drug-induced 10/12/2011  . Encounter for long-term (current) use of medications 08/21/2011  . Thrush 07/24/2011  . PULMONARY NODULE, LEFT LOWER LOBE 10/14/2009  . PATENT FORAMEN OVALE 10/14/2009  . Essential hypertension 06/04/2009  . G E R D 06/04/2009  . SNORING, HX OF 06/04/2009   Past Medical History:  Diagnosis Date  . Cataract    OD  . CHF (congestive heart failure) ([place]) 03/02/2017   EF 25-30% 2018  . Complication of anesthesia    various issues with oxygen  saturations post op  . COPD (chronic obstructive pulmonary disease) ([place])   . Depression   . Depression    Phreesia 11/04/2020  . Diverticulitis   .  DVT (deep venous thrombosis) ([place])   . Fibromyalgia   . GERD (gastroesophageal reflux disease)   . Heart murmur    Phreesia 11/04/2020  . Hiatal hernia   . History of deviated nasal septum    left- side  . HTN (hypertension)   . Hyperlipidemia   . Hypertension    Phreesia 11/04/2020  . Hypertensive  retinopathy    OU  . Myocardial infarction ([place])    Phreesia 11/04/2020  . Obesity   . On supplemental oxygen therapy    concentrator at night @ 1.5 l/m or when sleeps. O2 Sat niormally 87.  . Osteoporosis   . Osteoporosis    Phreesia 11/04/2020  . Oxygen deficiency    Phreesia 11/04/2020  . PAT (paroxysmal atrial tachycardia) ([place])   . PFO (patent foramen ovale)   . PULMONARY NODULE, LEFT LOWER LOBE 10/14/2009   44mm LLL nodule dec 2010. Stable and 45mm in Oct 2012. No further fu  . PVD (peripheral vascular disease) with claudication ([place]) 12/2017  . Right middle lobe pneumonia 07/24/2011   First noted at admit 07/10/11. Persists on cxr 07/22/11. Cleared on CT 08/24/11. No further followup  . Stroke ([place])   . TOBACCO ABUSE 06/04/2009   Past Surgical History:  Procedure Laterality Date  . ABDOMINAL HYSTERECTOMY N/A    Phreesia 11/04/2020  . CARDIOVASCULAR STRESS TEST  12/26/2004   EF 74%. NO EVIDENCE OF ISCHEMIA  . CATARACT EXTRACTION Left    Dr. Elliot Dally  . ESOPHAGOGASTRODUODENOSCOPY (EGD) WITH PROPOFOL N/A 04/15/2015   Procedure: ESOPHAGOGASTRODUODENOSCOPY (EGD) WITH PROPOFOL;  Surgeon: Laurence Spates, MD;  Location: [facility];  Service: Endoscopy;  Laterality: N/A;  . EYE SURGERY Left    Cat Sx  . JOINT REPLACEMENT N/A    Phreesia 11/04/2020  . KNEE ARTHROSCOPY  2000   left  . LAPAROSCOPIC CHOLECYSTECTOMY  04-16-2010   [facility]  . LOWER EXTREMITY ANGIOGRAPHY N/A 09/09/2017   Procedure: Lower Extremity Angiography;  Surgeon: Lorretta Harp, MD;  Location: [place] [facility];  Service: Cardiovascular;  Laterality: N/A;  . LOWER EXTREMITY INTERVENTION Left 01/17/2018   Procedure: LOWER EXTREMITY INTERVENTION;  Surgeon: Lorretta Harp, MD;  Location: [facility];  Service: Cardiovascular;  Laterality: Left;  . MOUTH SURGERY     03-26-15 multiple extractions stitches remains  . PERIPHERAL VASCULAR INTERVENTION Left 01/17/2018   Procedure: PERIPHERAL VASCULAR INTERVENTION;   Surgeon: Lorretta Harp, MD;  Location: [place];  Service: Cardiovascular;  Laterality: Left;  COMMON ILIAC  . RIGHT/LEFT HEART CATH AND CORONARY ANGIOGRAPHY N/A 03/04/2017   Procedure: Right/Left Heart Cath and Coronary Angiography;  Surgeon: Peter M Martinique, MD;  Location: [facility];  Service: Cardiovascular;  Laterality: N/A;  . TOTAL ABDOMINAL HYSTERECTOMY     post op needed oxygen was told "she gave them a scare"  . TUBAL LIGATION    . US ECHOCARDIOGRAPHY  11/20/2009   EF 55-60%   Allergies  Allergen Reactions  . Alprazolam Anaphylaxis and Other (See Comments)    REACTION: stops breathing  . Bee Venom Anaphylaxis  . Iodine Anaphylaxis, Swelling and Other (See Comments)    REACTION: swelling in throat  . Pseudoephedrine Hcl Er Shortness Of Breath  . Budesonide-Formoterol Fumarate Other (See Comments)    Blisters inside of mouth all over  . Crestor [Rosuvastatin Calcium] Other (See Comments)    Unable to walk  . Esomeprazole Magnesium Other (See Comments)    REACTION: "bouncing off walls"  .  Flonase [Fluticasone Propionate] Other (See Comments)    NOSE BLEED  . Lamictal [Lamotrigine] Rash    Patient got rash, labored breathing, and diarrhea  . Loratadine Other (See Comments)    claritin D causes shaking  . Lotrimin [Clotrimazole] Other (See Comments)    Mouth blisters  . Lunesta [Eszopiclone] Other (See Comments)    REACTION: "slept for a week"  . Oxcarbazepine Other (See Comments)    Causes deep sleep and dizziness  . Statins Other (See Comments)    Can't walk, legs won't work   . Zolpidem Tartrate Other (See Comments)    REACTION: "slept for a week"  . Betadine [Povidone Iodine] Other (See Comments)    Breathing problems  . Bevespi Aerosphere [Glycopyrrolate-Formoterol] Other (See Comments)    Pt believes this caused mouth sores and thrush   . Clarithromycin Other (See Comments)    All "mycins", Puts into "a" fib, Will take if has to for severe  sinus infection  . Effexor [Venlafaxine] Nausea And Vomiting and Other (See Comments)    cramps  . Lexapro [Escitalopram Oxalate] Other (See Comments)    hallucinations  . Aciphex [Rabeprazole Sodium] Rash  . Alendronate Sodium Other (See Comments)    "caused stomach problems for 3 days"  . Avelox [Moxifloxacin Hcl In Nacl] Other (See Comments)    Stomach cramps.   Darlin Coco [Valdecoxib] Rash  . Ceclor [Cefaclor] Rash  . Cephalexin Rash and Other (See Comments)    Pt states that she is possibly allergic to this - had a reaction to Cefaclor in the past and she does not want to these class drugs. Added per patient request.  . Covera-Hs [Verapamil Hcl] Palpitations  . Dicyclomine Hcl Rash  . Other Other (See Comments)    Glue from ekg/heart monitor leads --rash, Any MYCINS  . Tessalon Perles Rash   Prior to Admission medications   Medication Sig Start Date End Date Taking? Authorizing Provider  Alirocumab (PRALUENT) 75 MG/ML SOAJ Inject 75 mg into the skin every 14 (fourteen) days. 08/26/20  Yes Martinique, Peter M, MD  carvedilol (COREG) 12.5 MG tablet TAKE 1 TABLET BY MOUTH 2 TIMES DAILY. 09/12/20  Yes Martinique, Peter M, MD  clarithromycin (BIAXIN) 500 MG tablet Take 1 tablet (500 mg total) by mouth 2 (two) times daily. 11/06/20  Yes [name] Agreste, MD  clopidogrel (PLAVIX) 75 MG tablet TAKE 1 TABLET BY MOUTH EVERY DAY 05/16/20  Yes Martinique, Peter M, MD  desvenlafaxine (PRISTIQ) 50 MG 24 hr tablet Take 1 tablet (50 mg total) by mouth daily. 08/26/20  Yes Arfeen, Arlyce Harman, MD  diazepam (VALIUM) 2 MG tablet Take 1 tablet (2 mg total) by mouth as needed for anxiety. 09/16/20 09/16/21 Yes Arfeen, Arlyce Harman, MD  diclofenac sodium (VOLTAREN) 1 % GEL APPLY 2 GRAMS TO BOTH HANDS 4 TIMES A DAY 06/21/18  Yes [provider]  Eyelid Cleansers (OCUSOFT BABY EYELID & EYELASH EX) Apply topically.   Yes [provider]  furosemide (LASIX) 40 MG tablet TAKE 1 TABLET BY MOUTH EVERY DAY 10/21/20  Yes  Martinique, Peter M, MD  gabapentin (NEURONTIN) 100 MG capsule Take 200 mg by mouth See admin instructions. Take 1 in am and 1 in the lunch and 2 at bedtime 09/16/16  Yes [provider]  HYDROcodone-acetaminophen (NORCO/VICODIN) 5-325 MG tablet Take 1 tablet by mouth 3 (three) times daily.   Yes [provider]  ipratropium (ATROVENT) 0.02 % nebulizer solution USE 1 VIAL BY NEBULIZATION  4 (FOUR) TIMES DAILY. 11/06/20  Yes Brand Males, MD  montelukast (SINGULAIR) 10 MG tablet TAKE 1 TABLET BY MOUTH EVERYDAY AT BEDTIME 05/12/20  Yes Jacelyn Pi, Lilia Argue, MD  Multiple Vitamins-Minerals (VITAMIN D3 COMPLETE PO) Take by mouth.   Yes [provider]  Omega-3 Fatty Acids (FISH OIL) 1000 MG CAPS Take by mouth daily.   Yes [provider]  ondansetron (ZOFRAN-ODT) 8 MG disintegrating tablet TAKE 1 TABLET BY MOUTH EVERY 8 HOURS AS NEEDED NAUSEA OR VOMITING 12/04/19  Yes Jacelyn Pi, Lilia Argue, MD  oxybutynin (DITROPAN-XL) 10 MG 24 hr tablet TAKE 1 TABLET BY MOUTH EVERY DAY 05/12/20  Yes Jacelyn Pi, Lilia Argue, MD  OXYGEN Inhale 1.5-2 L into the lungs as needed (for shortness of breath).   Yes [provider]  pantoprazole (PROTONIX) 40 MG tablet TAKE 1 TABLET BY MOUTH EVERY DAY 05/25/20  Yes Jacelyn Pi, Lilia Argue, MD  polyethylene glycol [facility] / GLYCOLAX) packet Take 17 g by mouth daily as needed for mild constipation.    Yes [provider]  predniSONE (DELTASONE) 10 MG tablet Take 1 tablet (10 mg total) by mouth daily with breakfast. 4 po qd for 1 day, then 3po qd for 1 day, 2 po qd for 1 day, 1po qd for 1 day, 1/2 po qd for 2 days. 11/06/20  Yes [name] Agreste, MD  spironolactone (ALDACTONE) 25 MG tablet TAKE 1 TABLET BY MOUTH EVERY DAY 02/07/20  Yes Martinique, Peter M, MD  tiZANidine (ZANAFLEX) 2 MG tablet Take 2 mg by mouth at bedtime. Take one in noon and 1 bedtime   Yes [provider]  traZODone (DESYREL) 100 MG tablet Take one tab daily as needed  for sleep 08/26/20  Yes Arfeen, Arlyce Harman, MD  triamcinolone cream (KENALOG) 0.1 % SMARTSIG:1 Sparingly Topical Twice Daily PRN 04/18/20  Yes [provider]  warfarin (COUMADIN) 5 MG tablet TAKE 1/2 TO 1 TABLET BY MOUTH EVERY DAY OR AS DIRECTED BY COUMADIN CLINIC 06/03/20  Yes Martinique, Peter M, MD  nitroGLYCERIN (NITROSTAT) 0.4 MG SL tablet Place 1 tablet (0.4 mg total) under the tongue every 5 (five) minutes as needed for chest pain. 06/17/20 09/15/20  Martinique, Peter M, MD   Social History   Socioeconomic History  . Marital status: Divorced    Spouse name: Not on file  . Number of children: 2  . Years of education: Not on file  . Highest education level: Not on file  Occupational History  . Occupation: disability  Tobacco Use  . Smoking status: Former Smoker    Packs/day: 3.00    Years: 30.00    Pack years: 90.00    Types: Cigarettes    Quit date: 06/02/2010    Years since quitting: 10.4  . Smokeless tobacco: Never Used  . Tobacco comment: QUIT IN 2011  Vaping Use  . Vaping Use: Never used  Substance and Sexual Activity  . Alcohol use: No    Alcohol/week: 0.0 standard drinks  . Drug use: No  . Sexual activity: Yes    Partners: Male    Birth control/protection: Post-menopausal, Surgical  Other Topics Concern  . Not on file  Social History Narrative   Marital status: divorced in 1978 after ten years; dating casually in 2019.      Children: 3 biological children; 3 court appointed children; 6 grandchildren; 5 gg      Lives: alone; children in [place] and [place].      Employment: retired  age 31; disability for COPD, CVA at age 18.      Tobacco: former smoker; quit smoking 2011.      Alcohol: none      Exercise: walks dog four times per day; goes to pool three times per week.      ADLs: drives; no assistant devices; does have a walker.  Cleaning is limited in 2018.  Daughter helps with cleaning.  Does own grocery shopping.      Advanced Directives: YES; DNR/DNI; HCPOA:  Rolan Lipa Martin/daughter youngest.  Blind.               Social Determinants of Health   Financial Resource Strain: Not on file  Food Insecurity: Not on file  Transportation Needs: Not on file  Physical Activity: Not on file  Stress: Not on file  Social Connections: Not on file  Intimate Partner Violence: Not on file    Review of Systems Per HPI.  Objective:   Vitals:   11/08/20 1638  BP: (!) 142/88  Pulse: 81  Temp: 98.3 F (36.8 C)  TempSrc: Temporal  SpO2: 92%  Weight: 185 lb (83.9 kg)  Height: 5\' 4"  (1.626 m)     Physical Exam Vitals reviewed.  Constitutional:      Appearance: She is well-developed and well-nourished.  HENT:     Head: Normocephalic and atraumatic.  Eyes:     Extraocular Movements: EOM normal.     Conjunctiva/sclera: Conjunctivae normal.     Pupils: Pupils are equal, round, and reactive to light.  Neck:     Vascular: No carotid bruit.  Cardiovascular:     Rate and Rhythm: Normal rate and regular rhythm.     Pulses: Intact distal pulses.     Heart sounds: Normal heart sounds.  Pulmonary:     Effort: Pulmonary effort is normal.     Breath sounds: Rhonchi (RML) present.  Abdominal:     Palpations: Abdomen is soft. There is no pulsatile mass.     Tenderness: There is no abdominal tenderness.  Skin:    General: Skin is warm and dry.  Neurological:     Mental Status: She is alert and oriented to person, place, and time.  Psychiatric:        Mood and Affect: Mood and affect normal.        Behavior: Behavior normal.      Assessment & Plan:  Laurie Horton is a 75 y.o. female . Acute on chronic respiratory failure with hypoxia (HCC)  Pneumonia of right middle lobe due to infectious organism - Plan: CBC, Protime-INR  Atrial fibrillation with RVR ([place]) - Plan: Protime-INR  -Clinically improving.  Vital signs stable on O2 nasal cannula.  Tolerating Biaxin as well as prednisone with decreased wheezing.  We will continue  outpatient treatment with strict ER precautions if any worsening given her chronic medical history.  Understanding expressed.  INR obtained with history of A. fib and chronic anticoagulation, recent prednisone.  No orders of the defined types were placed in this encounter.  Patient Instructions    I am glad that you are improving.  Continue same medications for now, but if any increasing shortness of breath, fevers, worsening fatigue or any worsening symptoms be seen in the emergency room or urgent care.  If you are continuing to improve, follow-up with me in 5 days.  Let me know if there are questions in the meantime.  I will check some blood work today including the INR at  the request of your cardiologist office.   Community-Acquired Pneumonia, Adult Pneumonia is a type of lung infection that causes swelling in the airways of the lungs. Mucus and fluid may also build up inside the airways. This may cause coughing and difficulty breathing. There are different types of pneumonia. One type can develop while a person is in a hospital. A different type is called community-acquired pneumonia. It develops in people who are not, and have not recently been, in the hospital or another type of health care facility. What are the causes? This condition may be caused by:  Viruses. This is the most common cause of pneumonia.  Bacteria. Community-acquired pneumonia is often caused by Streptococcus pneumoniae bacteria. These bacteria are often passed from one person to another by breathing in droplets from the cough or sneeze of an infected person.  Fungi. This is the least common cause of pneumonia. What increases the risk? The following factors may make you more likely to develop this condition:  Having a chronic disease, such as chronic obstructive pulmonary disease (COPD), asthma, congestive heart failure, cystic fibrosis, diabetes, or kidney disease.  Having early-stage or late-stage HIV.  Having  sickle cell disease.  Having had your spleen removed (splenectomy).  Having poor dental hygiene.  Having a medical condition that increases the risk of breathing in (aspirating) secretions from your own mouth and nose.  Having a weakened body defense system (immune system).  Being a smoker.  Traveling to areas where pneumonia-causing germs commonly exist.  Being around animal habitats or animals that have pneumonia-causing germs, including birds, bats, rabbits, cats, and farm animals. What are the signs or symptoms? Symptoms of this condition include:  A dry cough.  A wet (productive) cough.  Fever.  Sweating.  Chest pain, especially when breathing deeply or coughing.  Rapid breathing or difficulty breathing.  Shortness of breath.  Shaking chills.  Fatigue.  Muscle aches. How is this diagnosed? This condition may be diagnosed based on:  Your medical history.  A physical exam. You may also have tests, including:  Chest X-rays.  Tests of your blood oxygen level and other blood gases.  Tests on blood, mucus (sputum), fluid around your lungs (pleural fluid), and urine. If your pneumonia is severe, other tests may be done to find the exact cause of your illness. How is this treated? Treatment for this condition depends on many factors, such as the cause of your pneumonia, the medicines you take, and other medical conditions that you have. For most adults, treatment and recovery from pneumonia may occur at home. In some cases, treatment must happen in a hospital. Treatment may include:  Medicines that are given by mouth or through an IV, including: ? Antibiotic medicines, if the pneumonia was caused by bacteria. ? Antiviral medicines, if the pneumonia was caused by a virus.  Being given extra oxygen.  Respiratory therapy. Although rare, treating severe pneumonia may include:  Using a machine to help you breathe (mechanical ventilation). This is done if you  are not breathing well on your own and you cannot maintain a safe blood oxygen level.  Thoracentesis. This is a procedure to remove fluid from around one lung or both lungs to help you breathe better. Follow these instructions at home:  Medicines  Take over-the-counter and prescription medicines only as told by your health care provider. ? Only take cough medicine if you are losing sleep. Be aware that cough medicine can prevent your body's natural ability to remove mucus from  your lungs.  If you were prescribed an antibiotic medicine, take it as told by your health care provider. Do not stop taking the antibiotic even if you start to feel better. General instructions  Sleep in a semi-upright position at night. Try sleeping in a reclining chair, or place a few pillows under your head.  Rest as needed and get at least 8 hours of sleep each night.  Drink enough water to keep your urine pale yellow. This will help to thin out mucus secretions in your lungs.  Eat a healthy diet that includes plenty of vegetables, fruits, whole grains, low-fat dairy products, and lean protein.  Do not use any products that contain nicotine or tobacco, such as cigarettes, e-cigarettes, and chewing tobacco. If you need help quitting, ask your health care provider.  Keep all follow-up visits as told by your health care provider. This is important. How is this prevented? You can lower your risk of developing community-acquired pneumonia by:  Getting a pneumococcal vaccine. There are different types and schedules of pneumococcal vaccines. Ask your health care provider which option is best for you. Consider getting the vaccine if: ? You are older than 75 years of age. ? You are older than 75 years of age and are undergoing cancer treatment, have chronic lung disease, or have other medical conditions that affect your immune system. Ask your health care provider if this applies to you.  Getting an influenza vaccine  every year. Ask your health care provider which type of vaccine is best for you.  Getting regular checkups from your dentist.  Washing your hands often. If soap and water are not available, use hand sanitizer. Contact a health care provider if:  You have a fever.  You are losing sleep because you cannot control your cough with cough medicine. Get help right away if:  You have worsening shortness of breath.  You have increased chest pain.  Your sickness becomes worse, especially if you are an older adult or have a weakened immune system.  You cough up blood. Summary  Pneumonia is an infection of the lungs.  Community-acquired pneumonia develops in people who have not been in the hospital. It can be caused by bacteria, viruses, or fungi.  This condition may be treated with antibiotics or antiviral medicines.  Severe cases may require hospitalization, mechanical ventilation, and other procedures to drain fluid from the lungs. This information is not intended to replace advice given to you by your health care provider. Make sure you discuss any questions you have with your health care provider. Document Revised: 06/16/2018 Document Reviewed: 06/16/2018 Elsevier Patient Education  [employer].    If you have lab work done today you will be contacted with your lab results within the next 2 weeks.  If you have not heard from [place] then please contact us. The fastest way to get your results is to register for My Chart.   IF you received an x-ray today, you will receive an invoice from [facility]. Please contact [facility] at [phone] with questions or concerns regarding your invoice.   IF you received labwork today, you will receive an invoice from [facility]. Please contact [facility] at [phone] with questions or concerns regarding your invoice.   Our billing staff will not be able to assist you with questions regarding bills from these companies.  You  will be contacted with the lab results as soon as they are available. The fastest way to get your results is  to activate your My Chart account. Instructions are located on the last page of this paperwork. If you have not heard from [place] regarding the results in 2 weeks, please contact this office.         Signed, Merri Ray, MD [facility]

## 2020-11-09 LAB — CBC
Hematocrit: 39.6 % (ref 34.0–46.6)
Hemoglobin: 13.1 g/dL (ref 11.1–15.9)
MCH: 29.6 pg (ref 26.6–33.0)
MCHC: 33.1 g/dL (ref 31.5–35.7)
MCV: 90 fL (ref 79–97)
Platelets: 326 10*3/uL (ref 150–450)
RBC: 4.42 x10E6/uL (ref 3.77–5.28)
RDW: 12.8 % (ref 11.7–15.4)
WBC: 9.4 10*3/uL (ref 3.4–10.8)

## 2020-11-09 LAB — PROTIME-INR
INR: 2.1 — ABNORMAL HIGH (ref 0.9–1.2)
Prothrombin Time: 21.7 s — ABNORMAL HIGH (ref 9.1–12.0)

## 2020-11-12 ENCOUNTER — Ambulatory Visit (INDEPENDENT_AMBULATORY_CARE_PROVIDER_SITE_OTHER): Payer: Medicare Other | Admitting: Pharmacist Clinician (PhC)/ Clinical Pharmacy Specialist

## 2020-11-12 DIAGNOSIS — I480 Paroxysmal atrial fibrillation: Secondary | ICD-10-CM

## 2020-11-12 DIAGNOSIS — Z7901 Long term (current) use of anticoagulants: Secondary | ICD-10-CM | POA: Diagnosis not present

## 2020-11-13 ENCOUNTER — Encounter: Payer: Self-pay | Admitting: Family Medicine

## 2020-11-13 ENCOUNTER — Other Ambulatory Visit: Payer: Self-pay

## 2020-11-13 ENCOUNTER — Ambulatory Visit (INDEPENDENT_AMBULATORY_CARE_PROVIDER_SITE_OTHER): Payer: Medicare Other

## 2020-11-13 ENCOUNTER — Ambulatory Visit (INDEPENDENT_AMBULATORY_CARE_PROVIDER_SITE_OTHER): Payer: Medicare Other | Admitting: Family Medicine

## 2020-11-13 VITALS — BP 139/86 | HR 94 | Temp 98.2°F | Ht 64.0 in | Wt 181.0 lb

## 2020-11-13 DIAGNOSIS — J189 Pneumonia, unspecified organism: Secondary | ICD-10-CM | POA: Diagnosis not present

## 2020-11-13 DIAGNOSIS — R197 Diarrhea, unspecified: Secondary | ICD-10-CM | POA: Diagnosis not present

## 2020-11-13 DIAGNOSIS — J449 Chronic obstructive pulmonary disease, unspecified: Secondary | ICD-10-CM

## 2020-11-13 DIAGNOSIS — J9621 Acute and chronic respiratory failure with hypoxia: Secondary | ICD-10-CM

## 2020-11-13 DIAGNOSIS — R112 Nausea with vomiting, unspecified: Secondary | ICD-10-CM

## 2020-11-13 MED ORDER — CLARITHROMYCIN 500 MG PO TABS: 500.0000 mg | ORAL_TABLET | Freq: Two times a day (BID) | ORAL | 0 refills | Status: DC

## 2020-11-13 MED ORDER — ONDANSETRON 8 MG PO TBDP: 8.0000 mg | ORAL_TABLET | Freq: Three times a day (TID) | ORAL | 0 refills | Status: DC | PRN

## 2020-11-13 NOTE — Progress Notes (Signed)
Subjective:  Patient ID: Laurie Horton, female    DOB: November 09, 1945  Age: 75 y.o. MRN: [id number]  CC:  Chief Complaint  Patient presents with  . Follow-up    On Pneumonia Pt Reports her cough has improved some. Pt sates having coughing episode every now and then. Cough has been productive at times.    HPI Laurie Horton presents for   Community-acquired pneumonia with acute on chronic respiratory failure.  History of COPD.  Right middle lobe pneumonia. See prior notes.  Evaluated January 5.  Discussed hospital/ER evaluation at that time but was declined.  Started on prednisone taper.Biaxin given multiple medication intolerances/allergies previously with ER precautions given.  Follow-up visit 5 days ago.  Was improving. Last dose biaxin tonight.  Prednisone last dose 2 days ago.   Since last visit - improving. Has not need oxygen except with exertion, nap or sleep. 1.5liters.  No fevers.  Still some cough episodes at times, but less. No hemoptysis.  Eating some - slight decreased appetite. drinking ok. Staying hydrated.  Diarrhea and vomiting after expired meat yesterday. Better today with pepto bismol.  One episode of diarrhea this am only - resolved since.  Last dose of biaxin today. Some stomach cramps - typical with that med. Requests zofran if needed for nausea if needed.  No missed doses of meds. Herbal cough drop.  atrovent nebs - decreased form every 4 hours to ow 2 times per day.   appt with coag clinic next Friday. On 2.5mg  qd.   History Patient Active Problem List   Diagnosis Date Noted  . Leg pain, bilateral 08/06/2020  . 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 Pain in right shoulder 11/08/2019  . Multiple drug allergies 09/15/2019  . Pain in left shoulder 06/21/2019  . Subclavian artery stenosis, left ([place]) 05/18/2019  . Claudication in peripheral vascular disease ([place]) 08/06/2017  . Class 1 obesity due to excess calories with serious  comorbidity and body mass index (BMI) of 31.0 to 31.9 in adult 05/01/2017  . Chronic systolic CHF (congestive heart failure) ([place]) 03/04/2017  . Acute on chronic respiratory failure with hypoxia ([place]) 02/08/2017  . COPD, severe ([place]) 02/08/2017  . Acute diastolic heart failure, NYHA class 1 ([place]) 02/08/2017  . Fibromyalgia and chronic chest pain 02/08/2017  . Atrial fibrillation with RVR ([place]) 02/08/2017  . Acute heart failure ([place]) 02/08/2017  . Rapid atrial fibrillation ([place]) 02/08/2017  . Shortness of breath 02/08/2017  . Moderate episode of recurrent major depressive disorder ([place]) 01/04/2017  . Generalized anxiety disorder 01/04/2017  . Pure hypercholesterolemia 12/28/2016  . 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 Acute blood loss anemia 08/03/2016  . Carotid stenosis 04/02/2015  . Long term current use of anticoagulant therapy 09/26/2014  . PAF (paroxysmal atrial fibrillation) ([place]) 09/19/2014  . 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 Chronic respiratory failure with hypoxia ([place]) 06/27/2014  . 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 Idiopathic scoliosis and kyphoscoliosis 07/19/2013  . Hyperglycemia, drug-induced 10/12/2011  . Encounter for long-term (current) use of medications 08/21/2011  . Thrush 07/24/2011  . PULMONARY NODULE, LEFT LOWER LOBE 10/14/2009  . PATENT FORAMEN OVALE 10/14/2009  . Essential hypertension 06/04/2009  . G E R D 06/04/2009  . SNORING, HX OF 06/04/2009   Past Medical History:  Diagnosis Date  . Cataract    OD  . CHF (congestive heart failure) ([place]) 03/02/2017   EF 25-30%  1610  . Complication of anesthesia    various issues with oxygen  saturations post op  . COPD (chronic obstructive pulmonary disease) ([place])   . Depression   . Depression    Phreesia 11/04/2020  . Diverticulitis   . DVT (deep venous thrombosis) ([place])   . Fibromyalgia   . GERD (gastroesophageal reflux  disease)   . Heart murmur    Phreesia 11/04/2020  . Hiatal hernia   . History of deviated nasal septum    left- side  . HTN (hypertension)   . Hyperlipidemia   . Hypertension    Phreesia 11/04/2020  . Hypertensive retinopathy    OU  . Myocardial infarction ([place])    Phreesia 11/04/2020  . Obesity   . On supplemental oxygen therapy    concentrator at night @ 1.5 l/m or when sleeps. O2 Sat niormally 87.  . Osteoporosis   . Osteoporosis    Phreesia 11/04/2020  . Oxygen deficiency    Phreesia 11/04/2020  . PAT (paroxysmal atrial tachycardia) ([place])   . PFO (patent foramen ovale)   . PULMONARY NODULE, LEFT LOWER LOBE 10/14/2009   44mm LLL nodule dec 2010. Stable and 55mm in Oct 2012. No further fu  . PVD (peripheral vascular disease) with claudication ([place]) 12/2017  . Right middle lobe pneumonia 07/24/2011   First noted at admit 07/10/11. Persists on cxr 07/22/11. Cleared on CT 08/24/11. No further followup  . Stroke ([place])   . TOBACCO ABUSE 06/04/2009   Past Surgical History:  Procedure Laterality Date  . ABDOMINAL HYSTERECTOMY N/A    Phreesia 11/04/2020  . CARDIOVASCULAR STRESS TEST  12/26/2004   EF 74%. NO EVIDENCE OF ISCHEMIA  . CATARACT EXTRACTION Left    Dr. Elliot Dally  . ESOPHAGOGASTRODUODENOSCOPY (EGD) WITH PROPOFOL N/A 04/15/2015   Procedure: ESOPHAGOGASTRODUODENOSCOPY (EGD) WITH PROPOFOL;  Surgeon: Laurence Spates, MD;  Location: [facility];  Service: Endoscopy;  Laterality: N/A;  . EYE SURGERY Left    Cat Sx  . JOINT REPLACEMENT N/A    Phreesia 11/04/2020  . KNEE ARTHROSCOPY  2000   left  . LAPAROSCOPIC CHOLECYSTECTOMY  04-16-2010   [facility]  . LOWER EXTREMITY ANGIOGRAPHY N/A 09/09/2017   Procedure: Lower Extremity Angiography;  Surgeon: Lorretta Harp, MD;  Location: [facility];  Service: Cardiovascular;  Laterality: N/A;  . LOWER EXTREMITY INTERVENTION Left 01/17/2018   Procedure: LOWER EXTREMITY INTERVENTION;  Surgeon: Lorretta Harp, MD;  Location: [facility];  Service: Cardiovascular;  Laterality: Left;  . MOUTH SURGERY     03-26-15 multiple extractions stitches remains  . PERIPHERAL VASCULAR INTERVENTION Left 01/17/2018   Procedure: PERIPHERAL VASCULAR INTERVENTION;  Surgeon: Lorretta Harp, MD;  Location: [facility];  Service: Cardiovascular;  Laterality: Left;  COMMON ILIAC  . RIGHT/LEFT HEART CATH AND CORONARY ANGIOGRAPHY N/A 03/04/2017   Procedure: Right/Left Heart Cath and Coronary Angiography;  Surgeon: Peter M Martinique, MD;  Location: [facility];  Service: Cardiovascular;  Laterality: N/A;  . TOTAL ABDOMINAL HYSTERECTOMY     post op needed oxygen was told "she gave them a scare"  . TUBAL LIGATION    . US ECHOCARDIOGRAPHY  11/20/2009   EF 55-60%   Allergies  Allergen Reactions  . Alprazolam Anaphylaxis and Other (See Comments)    REACTION: stops breathing  . Bee Venom Anaphylaxis  . Iodine Anaphylaxis, Swelling and Other (See Comments)    REACTION: swelling in throat  . Pseudoephedrine Hcl Er Shortness Of Breath  .  Budesonide-Formoterol Fumarate Other (See Comments)    Blisters inside of mouth all over  . Crestor [Rosuvastatin Calcium] Other (See Comments)    Unable to walk  . Esomeprazole Magnesium Other (See Comments)    REACTION: "bouncing off walls"  . Flonase [Fluticasone Propionate] Other (See Comments)    NOSE BLEED  . Lamictal [Lamotrigine] Rash    Patient got rash, labored breathing, and diarrhea  . Loratadine Other (See Comments)    claritin D causes shaking  . Lotrimin [Clotrimazole] Other (See Comments)    Mouth blisters  . Lunesta [Eszopiclone] Other (See Comments)    REACTION: "slept for a week"  . Oxcarbazepine Other (See Comments)    Causes deep sleep and dizziness  . Statins Other (See Comments)    Can't walk, legs won't work   . Zolpidem Tartrate Other (See Comments)    REACTION: "slept for a week"  . Betadine [Povidone Iodine] Other (See Comments)    Breathing problems  .  Bevespi Aerosphere [Glycopyrrolate-Formoterol] Other (See Comments)    Pt believes this caused mouth sores and thrush   . Clarithromycin Other (See Comments)    All "mycins", Puts into "a" fib, Will take if has to for severe sinus infection  . Effexor [Venlafaxine] Nausea And Vomiting and Other (See Comments)    cramps  . Lexapro [Escitalopram Oxalate] Other (See Comments)    hallucinations  . Aciphex [Rabeprazole Sodium] Rash  . Alendronate Sodium Other (See Comments)    "caused stomach problems for 3 days"  . Avelox [Moxifloxacin Hcl In Nacl] Other (See Comments)    Stomach cramps.   Darlin Coco [Valdecoxib] Rash  . Ceclor [Cefaclor] Rash  . Cephalexin Rash and Other (See Comments)    Pt states that she is possibly allergic to this - had a reaction to Cefaclor in the past and she does not want to these class drugs. Added per patient request.  . Covera-Hs [Verapamil Hcl] Palpitations  . Dicyclomine Hcl Rash  . Other Other (See Comments)    Glue from ekg/heart monitor leads --rash, Any MYCINS  . Tessalon Perles Rash   Prior to Admission medications   Medication Sig Start Date End Date Taking? Authorizing Provider  Alirocumab (PRALUENT) 75 MG/ML SOAJ Inject 75 mg into the skin every 14 (fourteen) days. 08/26/20  Yes Martinique, Peter M, MD  carvedilol (COREG) 12.5 MG tablet TAKE 1 TABLET BY MOUTH 2 TIMES DAILY. 09/12/20  Yes Martinique, Peter M, MD  clarithromycin (BIAXIN) 500 MG tablet Take 1 tablet (500 mg total) by mouth 2 (two) times daily. 11/06/20  Yes [name] Agreste, MD  clopidogrel (PLAVIX) 75 MG tablet TAKE 1 TABLET BY MOUTH EVERY DAY 05/16/20  Yes Martinique, Peter M, MD  desvenlafaxine (PRISTIQ) 50 MG 24 hr tablet Take 1 tablet (50 mg total) by mouth daily. 08/26/20  Yes Arfeen, Arlyce Harman, MD  diazepam (VALIUM) 2 MG tablet Take 1 tablet (2 mg total) by mouth as needed for anxiety. 09/16/20 09/16/21 Yes Arfeen, Arlyce Harman, MD  diclofenac sodium (VOLTAREN) 1 % GEL APPLY 2 GRAMS TO BOTH HANDS 4 TIMES  A DAY 06/21/18  Yes [provider]  Eyelid Cleansers (OCUSOFT BABY EYELID & EYELASH EX) Apply topically.   Yes [provider]  furosemide (LASIX) 40 MG tablet TAKE 1 TABLET BY MOUTH EVERY DAY 10/21/20  Yes Martinique, Peter M, MD  gabapentin (NEURONTIN) 100 MG capsule Take 200 mg by mouth See admin instructions. Take 1 in am and 1 in the  lunch and 2 at bedtime 09/16/16  Yes [provider]  HYDROcodone-acetaminophen (NORCO/VICODIN) 5-325 MG tablet Take 1 tablet by mouth 3 (three) times daily.   Yes [provider]  ipratropium (ATROVENT) 0.02 % nebulizer solution USE 1 VIAL BY NEBULIZATION 4 (FOUR) TIMES DAILY. 11/06/20  Yes Brand Males, MD  montelukast (SINGULAIR) 10 MG tablet TAKE 1 TABLET BY MOUTH EVERYDAY AT BEDTIME 05/12/20  Yes Jacelyn Pi, Lilia Argue, MD  Multiple Vitamins-Minerals (VITAMIN D3 COMPLETE PO) Take by mouth.   Yes [provider]  Omega-3 Fatty Acids (FISH OIL) 1000 MG CAPS Take by mouth daily.   Yes [provider]  ondansetron (ZOFRAN-ODT) 8 MG disintegrating tablet TAKE 1 TABLET BY MOUTH EVERY 8 HOURS AS NEEDED NAUSEA OR VOMITING 12/04/19  Yes Jacelyn Pi, Lilia Argue, MD  oxybutynin (DITROPAN-XL) 10 MG 24 hr tablet TAKE 1 TABLET BY MOUTH EVERY DAY 05/12/20  Yes Jacelyn Pi, Lilia Argue, MD  OXYGEN Inhale 1.5-2 L into the lungs as needed (for shortness of breath).   Yes [provider]  pantoprazole (PROTONIX) 40 MG tablet TAKE 1 TABLET BY MOUTH EVERY DAY 05/25/20  Yes Jacelyn Pi, Lilia Argue, MD  polyethylene glycol [facility] / GLYCOLAX) packet Take 17 g by mouth daily as needed for mild constipation.    Yes [provider]  predniSONE (DELTASONE) 10 MG tablet Take 1 tablet (10 mg total) by mouth daily with breakfast. 4 po qd for 1 day, then 3po qd for 1 day, 2 po qd for 1 day, 1po qd for 1 day, 1/2 po qd for 2 days. 11/06/20  Yes [name] Agreste, MD  spironolactone (ALDACTONE) 25 MG tablet TAKE 1 TABLET BY MOUTH EVERY  DAY 02/07/20  Yes Martinique, Peter M, MD  tiZANidine (ZANAFLEX) 2 MG tablet Take 2 mg by mouth at bedtime. Take one in noon and 1 bedtime   Yes [provider]  traZODone (DESYREL) 100 MG tablet Take one tab daily as needed for sleep 08/26/20  Yes Arfeen, Arlyce Harman, MD  triamcinolone cream (KENALOG) 0.1 % SMARTSIG:1 Sparingly Topical Twice Daily PRN 04/18/20  Yes [provider]  warfarin (COUMADIN) 5 MG tablet TAKE 1/2 TO 1 TABLET BY MOUTH EVERY DAY OR AS DIRECTED BY COUMADIN CLINIC 06/03/20  Yes Martinique, Peter M, MD  nitroGLYCERIN (NITROSTAT) 0.4 MG SL tablet Place 1 tablet (0.4 mg total) under the tongue every 5 (five) minutes as needed for chest pain. 06/17/20 09/15/20  Martinique, Peter M, MD   Social History   Socioeconomic History  . Marital status: Divorced    Spouse name: Not on file  . Number of children: 2  . Years of education: Not on file  . Highest education level: Not on file  Occupational History  . Occupation: disability  Tobacco Use  . Smoking status: Former Smoker    Packs/day: 3.00    Years: 30.00    Pack years: 90.00    Types: Cigarettes    Quit date: 06/02/2010    Years since quitting: 10.4  . Smokeless tobacco: Never Used  . Tobacco comment: QUIT IN 2011  Vaping Use  . Vaping Use: Never used  Substance and Sexual Activity  . Alcohol use: No    Alcohol/week: 0.0 standard drinks  . Drug use: No  . Sexual activity: Yes    Partners: Male    Birth control/protection: Post-menopausal, Surgical  Other Topics Concern  . Not on file  Social History Narrative   Marital status: divorced in  1978 after ten years; dating casually in 2019.      Children: 3 biological children; 3 court appointed children; 6 grandchildren; 5 gg      Lives: alone; children in [place] and [place].      Employment: retired age 52; disability for COPD, CVA at age 43.      Tobacco: former smoker; quit smoking 2011.      Alcohol: none      Exercise: walks dog four times per day; goes  to pool three times per week.      ADLs: drives; no assistant devices; does have a walker.  Cleaning is limited in 2018.  Daughter helps with cleaning.  Does own grocery shopping.      Advanced Directives: YES; DNR/DNI; HCPOA: Rolan Lipa Martin/daughter youngest.  Blind.               Social Determinants of Health   Financial Resource Strain: Not on file  Food Insecurity: Not on file  Transportation Needs: Not on file  Physical Activity: Not on file  Stress: Not on file  Social Connections: Not on file  Intimate Partner Violence: Not on file    Review of Systems  Per HPI.  Objective:   Vitals:   11/13/20 1611  BP: 139/86  Pulse: 94  Temp: 98.2 F (36.8 C)  TempSrc: Temporal  SpO2: 98%  Weight: 181 lb (82.1 kg)  Height: 5\' 4"  (1.626 m)     Physical Exam Vitals reviewed.  Constitutional:      Appearance: She is well-developed and well-nourished.  HENT:     Head: Normocephalic and atraumatic.  Eyes:     Extraocular Movements: EOM normal.     Conjunctiva/sclera: Conjunctivae normal.     Pupils: Pupils are equal, round, and reactive to light.  Neck:     Vascular: No carotid bruit.  Cardiovascular:     Rate and Rhythm: Normal rate and regular rhythm.     Pulses: Intact distal pulses.     Heart sounds: Normal heart sounds.  Pulmonary:     Effort: Pulmonary effort is normal. No respiratory distress.     Breath sounds: No stridor. Rhonchi (RML RLL. ) present. No wheezing.     Comments: Speaking in full sentences, no distress.  Abdominal:     Palpations: Abdomen is soft. There is no pulsatile mass.     Tenderness: There is no abdominal tenderness.  Skin:    General: Skin is warm and dry.  Neurological:     Mental Status: She is alert and oriented to person, place, and time.  Psychiatric:        Mood and Affect: Mood and affect normal.        Behavior: Behavior normal.      .DG Chest 2 View  Result Date: 11/13/2020 CLINICAL DATA:  75 year old female  with pneumonia EXAM: CHEST - 2 VIEW COMPARISON:  11/06/2020 FINDINGS: Cardiomediastinal silhouette unchanged in size and contour. No pneumothorax. No pleural effusion. Coarsened interstitial markings with reticular opacities of the mid and lower lungs, slightly improved compared to the prior plain film. Degenerative changes of the spine with no displaced fracture. IMPRESSION: Slight improvement in the airspace opacities, suggesting resolving multifocal infection. Electronically Signed   By: Corrie Mckusick D.O.   On: 11/13/2020 16:26     Assessment & Plan:  Laurie Horton is a 75 y.o. female . Pneumonia of right middle lobe due to infectious organism - Plan: DG Chest 2 View, clarithromycin (BIAXIN) 500  MG tablet Acute on chronic respiratory failure with hypoxia (HCC) COPD, severe (HCC)  -Clinical and radiographic improvement.  Continue Biaxin for full 10-day course, with RTC precautions if any worsening of symptoms, especially after completion of antibiotic.  As tolerated decreased nebulizer treatments.  No further prednisone at this time.  Nausea and vomiting, intractability of vomiting not specified, unspecified vomiting type - Plan: ondansetron (ZOFRAN-ODT) 8 MG disintegrating tablet Diarrhea, unspecified type  -Suspected foodborne illness, improving.  Zofran prescribed if needed, RTC/ER precautions.   Meds ordered this encounter  Medications  . ondansetron (ZOFRAN-ODT) 8 MG disintegrating tablet    Sig: Take 1 tablet (8 mg total) by mouth every 8 (eight) hours as needed for nausea or vomiting.    Dispense:  20 tablet    Refill:  0  . clarithromycin (BIAXIN) 500 MG tablet    Sig: Take 1 tablet (500 mg total) by mouth 2 (two) times daily.    Dispense:  6 tablet    Refill:  0    Add to current regimen to extend to 10 days duration.   Patient Instructions   Continue Biaxin for additional 3 days which will be a total of 10 days for treatment of pneumonia.  X-ray does show some  improvement of pneumonia but not resolved.  I am encouraged by your improvement in symptoms.  If any worsening cough, shortness of breath or any worsening after completion of antibiotic, be seen right away.  Follow-up in 1 month for repeat x-ray as well as to review your other medications.  Sooner if needed.  Let me know if there are questions.  I did refill Zofran if needed for nausea and vomiting.  I suspect your symptoms yesterday were due to possible food poisoning but this should continue to improve.  Return to the clinic or go to the nearest emergency room if any of your symptoms worsen or new symptoms occur.     If you have lab work done today you will be contacted with your lab results within the next 2 weeks.  If you have not heard from [place] then please contact us. The fastest way to get your results is to register for My Chart.   IF you received an x-ray today, you will receive an invoice from [facility]. Please contact [facility] at [phone] with questions or concerns regarding your invoice.   IF you received labwork today, you will receive an invoice from [facility]. Please contact [facility] at [phone] with questions or concerns regarding your invoice.   Our billing staff will not be able to assist you with questions regarding bills from these companies.  You will be contacted with the lab results as soon as they are available. The fastest way to get your results is to activate your My Chart account. Instructions are located on the last page of this paperwork. If you have not heard from [place] regarding the results in 2 weeks, please contact this office.       Signed, Merri Ray, MD [facility]

## 2020-11-13 NOTE — Patient Instructions (Addendum)
Continue Biaxin for additional 3 days which will be a total of 10 days for treatment of pneumonia.  X-ray does show some improvement of pneumonia but not resolved.  I am encouraged by your improvement in symptoms.  If any worsening cough, shortness of breath or any worsening after completion of antibiotic, be seen right away.  Follow-up in 1 month for repeat x-ray as well as to review your other medications.  Sooner if needed.  Let me know if there are questions.  I did refill Zofran if needed for nausea and vomiting.  I suspect your symptoms yesterday were due to possible food poisoning but this should continue to improve.  Return to the clinic or go to the nearest emergency room if any of your symptoms worsen or new symptoms occur.     If you have lab work done today you will be contacted with your lab results within the next 2 weeks.  If you have not heard from [place] then please contact us. The fastest way to get your results is to register for My Chart.   IF you received an x-ray today, you will receive an invoice from [facility]. Please contact [facility] at [phone] with questions or concerns regarding your invoice.   IF you received labwork today, you will receive an invoice from [facility]. Please contact [facility] at [phone] with questions or concerns regarding your invoice.   Our billing staff will not be able to assist you with questions regarding bills from these companies.  You will be contacted with the lab results as soon as they are available. The fastest way to get your results is to activate your My Chart account. Instructions are located on the last page of this paperwork. If you have not heard from [place] regarding the results in 2 weeks, please contact this office.

## 2020-11-18 ENCOUNTER — Ambulatory Visit: Payer: Medicare Other | Admitting: Adult Health

## 2020-11-18 ENCOUNTER — Other Ambulatory Visit: Payer: Self-pay

## 2020-11-18 MED ORDER — PANTOPRAZOLE SODIUM 40 MG PO TBEC: 40.0000 mg | DELAYED_RELEASE_TABLET | Freq: Every day | ORAL | 1 refills | Status: DC

## 2020-11-20 ENCOUNTER — Other Ambulatory Visit: Payer: Self-pay

## 2020-11-20 ENCOUNTER — Telehealth (INDEPENDENT_AMBULATORY_CARE_PROVIDER_SITE_OTHER): Payer: Medicare Other | Admitting: Psychiatry

## 2020-11-20 ENCOUNTER — Encounter (HOSPITAL_COMMUNITY): Payer: Self-pay | Admitting: Psychiatry

## 2020-11-20 DIAGNOSIS — F411 Generalized anxiety disorder: Secondary | ICD-10-CM | POA: Diagnosis not present

## 2020-11-20 DIAGNOSIS — F331 Major depressive disorder, recurrent, moderate: Secondary | ICD-10-CM | POA: Diagnosis not present

## 2020-11-20 MED ORDER — DESVENLAFAXINE SUCCINATE ER 50 MG PO TB24: 50.0000 mg | ORAL_TABLET | Freq: Every day | ORAL | 2 refills | Status: DC

## 2020-11-20 MED ORDER — TRAZODONE HCL 100 MG PO TABS: ORAL_TABLET | ORAL | 2 refills | Status: DC

## 2020-11-20 NOTE — Progress Notes (Signed)
Virtual Visit via Telephone Note  I connected with Laurie Horton on 11/20/20 at  8:40 AM EST by telephone and verified that I am speaking with the correct person using two identifiers.  Location: Patient: Home Provider: Home Office   I discussed the limitations, risks, security and privacy concerns of performing an evaluation and management service by telephone and the availability of in person appointments. I also discussed with the patient that there may be a patient responsible charge related to this service. The patient expressed understanding and agreed to proceed.   History of Present Illness: Patient is evaluated by phone session.  She is on the phone by herself.  Patient told she has been very sick lately and diagnosed with pneumonia.  She is getting treatment outpatient because she was scared to go inpatient.  Her primary care physician is helping her with nebulizer and now she is using 24/7 oxygen.  She is feeling better.  She denies any crying spells or any feeling of hopelessness but admitted feeling very tired.  There are few nights that she skip trazodone but she could not sleep.  She was also sad because not able to see the grandkids on Christmas because her son-in-law got COVID and hospitalized.  Patient now started feeling better.  Her neighbors are very helpful and if she needs something they can bring her.  Patient lives by herself with a cat.  Patient denies any suicidal thoughts or homicidal thoughts.  She denies any anger, panic attacks, agitation.  She is taking Pristiq and trazodone.  She takes Valium only as needed but also takes gabapentin, hydrocodone and muscle relaxant.  Past Psychiatric History: H/O depression and anxiety. No H/Oinpatient treatment, suicidal attempt, paranoia,mania andhallucination. Seeing psychiatrist in 2006 after a stroke. Tried Cymbalta, lexaproand Celexa in the past with limited response. Tried Ambien, Lunesta, lexapro, Lamictaland Xanax  but developed allergies and side effects. We triedhigherPristiq buthasside effects.   Recent Results (from the past 2160 hour(s))  POCT INR     Status: Abnormal   Collection Time: 09/09/20  7:47 AM  Result Value Ref Range   INR 3.8 (A) 2.0 - 3.0  Lipid panel     Status: Abnormal   Collection Time: 09/17/20  9:46 AM  Result Value Ref Range   Cholesterol, Total 115 100 - 199 mg/dL   Triglycerides 156 (H) 0 - 149 mg/dL   HDL 36 (L) >39 mg/dL   VLDL Cholesterol Cal 27 5 - 40 mg/dL   LDL Chol Calc (NIH) 52 0 - 99 mg/dL   Chol/HDL Ratio 3.2 0.0 - 4.4 ratio    Comment:                                   T. Chol/HDL Ratio                                             Men  Women                               1/2 Avg.Risk  3.4    3.3  Avg.Risk  5.0    4.4                                2X Avg.Risk  9.6    7.1                                3X Avg.Risk 23.4   11.0   POCT INR     Status: Abnormal   Collection Time: 10/11/20  8:52 AM  Result Value Ref Range   INR 5.1 (A) 2.0 - 3.0  POCT CBC     Status: Abnormal   Collection Time: 11/06/20  9:18 AM  Result Value Ref Range   WBC 10.2 4.6 - 10.2 K/uL   Lymph, poc 1.2 0.6 - 3.4   POC LYMPH PERCENT 10.4 10 - 50 %L   MID (cbc) 0.5 0 - 0.9   POC MID % 4.8 0 - 12 %M   POC Granulocyte 8.6 (A) 2 - 6.9   Granulocyte percent 84.8 (A) 37 - 80 %G   RBC 4.36 4.04 - 5.48 M/uL   Hemoglobin 13.3 11 - 14.6 g/dL   HCT, POC 40.3 29 - 41 %   MCV 92.4 76 - 111 fL   MCH, POC 30.6 27 - 31.2 pg   MCHC 33.1 31.8 - 35.4 g/dL   RDW, POC 13.5 %   Platelet Count, POC 303 142 - 424 K/uL   MPV 7.1 0 - 99.8 fL  CBC     Status: None   Collection Time: 11/08/20  5:37 PM  Result Value Ref Range   WBC 9.4 3.4 - 10.8 x10E3/uL   RBC 4.42 3.77 - 5.28 x10E6/uL   Hemoglobin 13.1 11.1 - 15.9 g/dL   Hematocrit 39.6 34.0 - 46.6 %   MCV 90 79 - 97 fL   MCH 29.6 26.6 - 33.0 pg   MCHC 33.1 31.5 - 35.7 g/dL   RDW 12.8 11.7 - 15.4 %    Platelets 326 150 - 450 x10E3/uL  Protime-INR     Status: Abnormal   Collection Time: 11/08/20  5:37 PM  Result Value Ref Range   INR 2.1 (H) 0.9 - 1.2    Comment: Reference interval is for non-anticoagulated patients. Suggested INR therapeutic range for Vitamin K antagonist therapy:    Standard Dose (moderate intensity                   therapeutic range):       2.0 - 3.0    Higher intensity therapeutic range       2.5 - 3.5    Prothrombin Time 21.7 (H) 9.1 - 12.0 sec    Psychiatric Specialty Exam: Physical Exam  Review of Systems  Weight 181 lb (82.1 kg).There is no height or weight on file to calculate BMI.  General Appearance: NA  Eye Contact:  NA  Speech:  Normal Rate  Volume:  Normal  Mood:  tired  Affect:  NA  Thought Process:  Descriptions of Associations: Intact  Orientation:  Full (Time, Place, and Person)  Thought Content:  Rumination  Suicidal Thoughts:  No  Homicidal Thoughts:  No  Memory:  Immediate;   Good Recent;   Fair Remote;   Fair  Judgement:  Intact  Insight:  Present  Psychomotor Activity:  NA  Concentration:  Concentration: Fair and Attention Span: Fair  Recall:  Good  Fund of Knowledge:  Good  Language:  Good  Akathisia:  No  Handed:  Right  AIMS (if indicated):     Assets:  Communication Skills Desire for Improvement Housing Resilience  ADL's:  Intact  Cognition:  WNL  Sleep:   fair      Assessment and Plan: Major depressive disorder, recurrent.  Generalized anxiety disorder.    Patient had pneumonia but now recovering.  She is using nebulizer and oxygen 24/7.  I recommend hold for the Valium as patient still struggle with cough and taking multiple medication which includes muscle relaxant, gabapentin and hydrocodone.  She tried cutting down the trazodone but could not sleep.  I recommend to continue trazodone and Pristiq to avoid relapses however once she is clear from infection and not taking any muscle relaxant and then we may  consider restarting Valium.  Patient told her lungs are still not clear and she had an upcoming appointment with PCP.  I will continue trazodone 100 mg at bedtime and Pristiq 50 mg daily.  We will not renew Valium at this visit until her lungs are clear and she cut down her muscle relaxant, pain medicine.  Patient acknowledged and agreed.  Recommended to call us back if there is any question or any concern.  Follow-up in 3 months.  Follow Up Instructions:    I discussed the assessment and treatment plan with the patient. The patient was provided an opportunity to ask questions and all were answered. The patient agreed with the plan and demonstrated an understanding of the instructions.   The patient was advised to call back or seek an in-person evaluation if the symptoms worsen or if the condition fails to improve as anticipated.  I provided 15 minutes of non-face-to-face time during this encounter.   Kathlee Nations, MD

## 2020-11-27 ENCOUNTER — Other Ambulatory Visit (HOSPITAL_COMMUNITY): Payer: Self-pay | Admitting: Cardiovascular Disease

## 2020-11-27 ENCOUNTER — Ambulatory Visit (HOSPITAL_COMMUNITY)
Admission: RE | Admit: 2020-11-27 | Discharge: 2020-11-27 | Disposition: A | Discharge status: Home / Self Care | Payer: Medicare Other | Source: Ambulatory Visit | Attending: Cardiovascular Disease | Admitting: Cardiovascular Disease

## 2020-11-27 ENCOUNTER — Other Ambulatory Visit: Payer: Self-pay

## 2020-11-27 DIAGNOSIS — I10 Essential (primary) hypertension: Secondary | ICD-10-CM | POA: Insufficient documentation

## 2020-11-27 DIAGNOSIS — I739 Peripheral vascular disease, unspecified: Secondary | ICD-10-CM | POA: Insufficient documentation

## 2020-11-27 DIAGNOSIS — I25118 Atherosclerotic heart disease of native coronary artery with other forms of angina pectoris: Secondary | ICD-10-CM | POA: Diagnosis present

## 2020-11-27 DIAGNOSIS — I5022 Chronic systolic (congestive) heart failure: Secondary | ICD-10-CM | POA: Diagnosis present

## 2020-11-27 DIAGNOSIS — E7800 Pure hypercholesterolemia, unspecified: Secondary | ICD-10-CM | POA: Diagnosis present

## 2020-11-27 DIAGNOSIS — I6523 Occlusion and stenosis of bilateral carotid arteries: Secondary | ICD-10-CM | POA: Diagnosis present

## 2020-11-27 DIAGNOSIS — G458 Other transient cerebral ischemic attacks and related syndromes: Secondary | ICD-10-CM

## 2020-11-29 ENCOUNTER — Other Ambulatory Visit: Payer: Self-pay

## 2020-11-29 ENCOUNTER — Ambulatory Visit (INDEPENDENT_AMBULATORY_CARE_PROVIDER_SITE_OTHER): Payer: Medicare Other

## 2020-11-29 DIAGNOSIS — I480 Paroxysmal atrial fibrillation: Secondary | ICD-10-CM

## 2020-11-29 DIAGNOSIS — Z7901 Long term (current) use of anticoagulants: Secondary | ICD-10-CM

## 2020-11-29 LAB — POCT INR: INR: 2.3 (ref 2.0–3.0)

## 2020-11-29 NOTE — Patient Instructions (Signed)
Continue with 2.5 mg daily, repeat INR in 6 weeks  

## 2020-12-17 ENCOUNTER — Ambulatory Visit: Payer: Medicare Other | Admitting: Internal Medicine

## 2020-12-19 ENCOUNTER — Encounter: Payer: Self-pay | Admitting: Internal Medicine

## 2020-12-19 ENCOUNTER — Other Ambulatory Visit: Payer: Self-pay

## 2020-12-19 ENCOUNTER — Ambulatory Visit (INDEPENDENT_AMBULATORY_CARE_PROVIDER_SITE_OTHER): Payer: Medicare Other | Admitting: Internal Medicine

## 2020-12-19 ENCOUNTER — Ambulatory Visit: Payer: Medicare Other | Admitting: Family Medicine

## 2020-12-19 VITALS — BP 122/72 | HR 74 | Temp 97.3°F | Ht 64.0 in | Wt 184.2 lb

## 2020-12-19 DIAGNOSIS — Z8701 Personal history of pneumonia (recurrent): Secondary | ICD-10-CM | POA: Diagnosis not present

## 2020-12-19 DIAGNOSIS — Z889 Allergy status to unspecified drugs, medicaments and biological substances status: Secondary | ICD-10-CM | POA: Diagnosis not present

## 2020-12-19 DIAGNOSIS — Z7185 Encounter for immunization safety counseling: Secondary | ICD-10-CM | POA: Diagnosis not present

## 2020-12-19 DIAGNOSIS — J449 Chronic obstructive pulmonary disease, unspecified: Secondary | ICD-10-CM

## 2020-12-19 NOTE — Patient Instructions (Addendum)
COPD, severe ([place])   -Stable disease after recent pneumonia in January 2022/December 2021  Plan -Continue using daily nebulizer of i Atrovent 4 times daily -Continue nighttime and exertional oxygen --Holding off on nebulizers for shared decision making -Get pulmonary function test sometime in the next few to several weeks to few months  Vaccine counseling Multiple drug allergies History of recent pneumonia   -We presume that you have had Covid pneumonia in December 2021/January 2022. -Chest x-ray January 2022 is abnormal  Plan -Check Covid IgG antibody today -Do CT scan of the chest high-resolution without contrast mid March 2022  Follow-up -Return in March 2022 after PFT and CT scan of the chest to see myself or nurse practitioner

## 2020-12-19 NOTE — Progress Notes (Signed)
OV 05/19/2016  Chief Complaint  Patient presents with  . Follow-up    Pt c/o worsening SOB, prod cough with thick white mucus.      This is a routine follow-up. Last visit was in April 2017 with nurse practitioner. At that time treated for COPD exacerbation according to her history but review of the chart does not show that to be true. At this point in time she says COPD stable although she says she might be in flare up on account of her fibromyalgia. Starting her symptoms out it appears that it is generally stable with dyspnea at baseline and cough with mild sputum at baseline. She is more hobbled by her chronic pain and fibromyalgia. Couple months ago apparently a hydrocodone was discontinued by rheumatology and therefore she is having "withdrawal". She does have a new pain medication physician. She is on gabapentin for fibromyalgia. There are no other new issues. She does not want antibiotics or prednisone for her perceived exacerbation  OV 11/17/2016  Chief Complaint  Patient presents with  . Follow-up    Pt states her SOB has worsened since last OV. Pt states she has a burning in her chest when she becomes SOB. Pt c/o prod cough with white mucus in morning, cough becomes nonprod throughout the day.     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 Last CT chest March 2017 without any mass and associated mild cor pulmonale   Six-month follow-up visit. She is completely overwhelmed by her fibromyalgia and depression. The fibromyalgia is worse. She says she is change doctors because of this. She's had a few admissions in the interim but none of them give a COPD exacerbation according to chart review. She suffered frustrated by her heels oxygen system even though it is light it is causing her pain. She uses Atrovent nebulizer but wants change to something else but at the same time has rejected use of any other  nebulizer or oral inhaler because of side effects. She is burning chest pain with inspiration and associated with her costochondral junction trigger points. 2014 review of the chart shows normal cardiac stress test. November 2017 chest x-ray is clear. She does not want any further imaging. Chest pain is mild to severe and variable. Worsened with inspiration. No radiation associated wheezing. No sputum production  OV 05/25/2017  Chief Complaint  Patient presents with  . Follow-up    Pt states her SOB has worsened since last OV in 11/2016. Pt states she only coughs after her neb treatment - pt states her mucus is yellow in color and c/o occ chest discomfort. Pt denies f/c/s.     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 Last CT chest March 2017 without any mass and associated mild cor pulmonale   Follow-up exertional hypoxemia associated with his emphysema. Also associated with fibromyalgia. Last visit she had atypical chest pain. Recommended she see cardiology. 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 According to her history she has significant coronary artery disease but is fairly advanced. She is frustrated with this but realistic. She feels her days are number. In terms of COPD emphysema and is stable. She is on Atrovent inhalers. She is on oxygen. She wants a lighter system. She still burden by fibromyalgia. She does not want to do  any vaccines anymore including flu shot and the new  shingles vaccine   OV 11/22/2017  Chief Complaint  Patient presents with  . 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  She uses oxygen and nebulizers.  She is not interested in rehab or vaccines.  New issue: Preoperative pulmonary evaluation.  She is having significant bilateral lower extremity claudication.  She states that she is in severe pain walking from her door to the mailbox to the point she is almost crying.  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  Apparently Dr. Trula Slade wants to try a laparotomy approach.  However Dr. Broadus John might take her to the cardiac Cath Lab and placed stents.  She says she is sensitive to fentanyl and is worried about anesthesia complications but the pain is so severe she is willing to take the risk.  She has had previous cardiac catheterization without any problems other than being sensitive to fentanyl.  She says this can be done in the cardiac Cath Lab with anesthesia support.  She wants me to talk to Dr. Betsy Coder and Dr. Trula Slade about this.   OV 07/18/2018  Subjective:  Patient ID: Laurie Horton, female , DOB: 09/04/1946 , age 75 y.o. , MRN: [id number] , ADDRESS: [address] [place] [address]   07/18/2018 -   Chief Complaint  Patient presents with  . Follow-up    Pt states her chest is hurting her all the time now and states she does not think her neb solutions are working for her anymore now. Pt also states she has had some worsening SOB and is also coughing up white phlegm which is comes out in chunks.     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  She uses oxygen and nebulizers. Last CT chest March 2017 without any mass and associated mild cor pulmonale   HPI TEIRRA CARAPIA 75 y.o. -after last visit she  saw nurse practitioner in June 2019 fora mild respiratory flare. At the time treated with allergy medications antihistamines.She tells me thathe last saw me January 2019 she had an iliac stent place in the left side and after that her effort tolerance is improved but in the last few months she's noticed a decrease in effort tolerance with worsening dyspnea and also increased cough and increased sputum production in volume and also consistency without change in color This no fever or weight loss. She will not have a flu shot because of prior allergy. Her last CT scan of the chest was in 2017 and she is requesting for another one. Her inhaler as ipratropium which she says she's not happy with. In the past she's uses Spiriva and Symbicort and these have caused blisters and so she is generally where you have inhalers although she wants something other than her current one.       08/19/2018  - Visit   [name].  Patient was last seen in our office visit on  07/18/2018 started on Bevespi.  Patient reports that after 1 day of use of the Bevespi inhaler she developed thrush and mouth sores.  She she contacted our office to be treated with nystatin.  Patient reports that most mouth sores resolved there remains one.  Patient is also having extensive dental work done.  Patient also has been treated for scabies as well as impetigo by dermatology recently.   Patient completed a high-res CT in 07/29/2018 that showed no real changes from baseline.  Still showing severe emphysema.   Patient reports that she continues to use her Atrovent nebulizer but is wondering if there is any other options available for her.  Patient feels that the Atrovent nebulizer is not working as well.  Patient is currently using as needed and not using it scheduled.  MMRC - Breathlessness Score 3 - I stop for breath after walking about 100 yards or after a few minutes on level ground (isle at grocery  store is 140f)  Patient reports that she has known triggers of shortness of breath with exertion, when there is high pollen counts, when she is walking, when she is outside for extended periods of time.  Patient reports she is been using 2 L via nasal cannula of oxygen with exertion as well as at rest.  Patient reports she forgot her POC at home.  She arrived to our office on room air.  Oxygen saturations 87.  Patient refused oxygen in our office and states that she does not think she needs oxygen right now.  Patient is sad and concerned regarding her limitations with vascular surgery.  Patient believes that she needs a surgery but reports that vascular team as well as cardiology does not think that she would be a good surgical candidate.  She reports that she has not heard back from Dr. BNaida Sleightoffice regarding her most recent test results.   Tests:  01/21/2016-CT chest without contrast- moderate centrilobular emphysema and diffuse bronchial wall thickening mild subpleural density in the dependent lower lobes  01/20/2016-pulmonary function test- airway obstruction and diffusion defect suggesting emphysema  Imaging:  02/11/2017-chest x-ray-stable large cardiac silhouette, lungs are hyperinflated, interstitial edema pattern unchanged from prior scans   Cardiac:  07/08/2017-echocardiogram-LV ejection fraction 50 to 516% grade 1 diastolic dysfunction      OV 12/21/2018  Subjective:  Patient ID: KArmen Horton female , DOB: 2October 20, 1947, age 75y.o. , MRN: [id number], 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  She uses oxygen and nebulizers. Last CT chest March 2017 without any mass and associated mild cor pulmonale  12/21/2018 -   Chief Complaint  Patient presents with  . Follow-up    Pt states due to being switched to a  new medication, she has had labored breathing. SOB is with exertion, has an occ cough with white phlegm, and also has had some occ CP.     HPI KLILLAR BIANCA89y.o. -  Presents for routine follow-up.  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  She uses oxygen sporadically and nebulizer sporadically.  She is very afraid of medicines because of multiple allergies.  She says her psychiatrist Dr. AMelissa Montaneplaced her on some medications that caused a rash.  Other than that she is okay.      OV  09/15/2019  Subjective:  Patient ID: Laurie Horton, female , DOB: 1946-09-03 , age 62 y.o. , MRN: [id number] , ADDRESS: [address] [place] [address]  Follow-up emphysema with chronic hypoxemic restorative failure and associated fibromyalgia and associated chronic systolic heart failure: Overall doing well.  She uses oxygen and nebulizers. Last CT chest March 2017 without any mass and associated mild cor pulmonale   09/15/2019 -  No chief complaint on file.    HPI DAMARIA VACHON 75 y.o. -     ROS - per HPI     OV 01/17/2020  Subjective:  Patient ID: Laurie Horton, female , DOB: 1946-05-14 , age 75 y.o. , MRN: [id number] , ADDRESS: [address] [place] [address]   01/17/2020 -   Chief Complaint  Patient presents with  . Follow-up     HPI LAQUESHA HOLCOMB 75 y.o. -presents for face-to-face follow-up.  In December she called with a COPD flareup symptoms.  She wanted Biaxin despite her allergies.  She feels that Biaxin helps her.  She says she was given prednisone which helped but she was only given generic clarithromycin instead of the tradename Biaxin.  She says it did not work.  She wants tradename Biaxin only.  She says since then her cough is more than baseline.  She also is like sputum that is discolored.  She feels like she is in exacerbation and this is preventing her from getting the COVID-19  vaccine.  She feels another course of tradename Biaxin is required.  She again does not want generic clarithromycin.  She is compliant with her baseline nebulizer nighttime oxygen.  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  She is now recovering from that.  She talked about Covid vaccine.  She wants to get it.  He has had pneumonia vaccine without problem but recently flu shot she feels this put her in the hospital.  She also has multiple oral drug allergies.  Overall she continues to mask and follow social distancing.       CAT COPD Symptom & Quality of Life Score (GSK trademark) 0 is no burden. 5 is highest burden 07/18/2018  12/21/2018  01/17/2020   Never Cough -> Cough all the time _0 No phlegm in chest -> Chest is full of phlegm _1 No chest tightness -> Chest feels very tight _2 No dyspnea for 1 flight stairs/hill -> Very dyspneic for 1 flight of stairs _3 No limitations for ADL at home -> Very limited with ADL at home _4 Confident leaving home -> Not at all confident leaving home 0 1 2  Sleep soundly -> Do not sleep soundly because of lung condition _5 Lots of Energy -> No energy at all _6 TOTAL Score (max 40)  _7 No flowsheet data found.     ROS - per HPI   OV 12/19/2020  Subjective:  Patient ID: Laurie Horton, female , DOB: 12/26/1945 , age 60 y.o. , MRN: [id number] , ADDRESS: [address] [place] [address] PCP [name] Agreste, MD Patient Care Team: [name] Agreste, MD as PCP - General (Family Medicine) Lorretta Harp, MD as PCP - Cardiology (Cardiology) Margaretha [place], MD as Referring Physician (Physical Medicine and Rehabilitation)  Laurence Spates, MD (Inactive) as Consulting Physician (Gastroenterology) Garvin Fila, MD as Consulting Physician (Neurology) Martinique, Peter M, MD as Consulting Physician (Cardiology) Brand Males, MD as  Consulting Physician (Pulmonary Disease) Adele Schilder Arlyce Harman, MD as Consulting Physician (Psychiatry) Lorretta Harp, MD as Consulting Physician (Peripheral Vascular Disease)  This Provider for this visit: Treatment Team:  Attending Provider: Brand Males, MD    12/19/2020 -   Chief Complaint  Patient presents with  . Follow-up    Had Covid PNA 10/2020, doing some better     HPI KAROL SKARZYNSKI 75 y.o. -presents for follow-up of her COPD.  She continues to use Atrovent nebulizer.  She prefers nebulizers of inhaler.  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  She uses oxygen at night with exertion.  She tells me that in December 2021 around Christmas she had respiratory viral symptoms.  She says rapid antigen test by her daughter was negative.  Then in January 2022 she followed up with primary care physician who did a chest x-ray that showed pneumonia.  I have the chest x-ray with me visualized it.  There is an infiltrate.  She says she was told that she had Covid but there is no confirmatory evidence for this.  She treated herself at home.  She cannot have vaccines because of multiple drug allergies according to history.  I supported her in the decision making.  She is worried about abnormal chest x-ray.        CAT Score 12/19/2020  Total CAT Score 18       PFT  PFT Results Latest Ref Rng & Units 01/20/2016 08/14/2013  FVC-Pre L 2.79 -  FVC-Predicted Pre % 93 100  FVC-Post L - 3.07  FVC-Predicted Post % - 103  Pre FEV1/FVC % % 67 62  Post FEV1/FCV % % - 64  FEV1-Pre L 1.86 1.84  FEV1-Predicted Pre % 82 82  FEV1-Post L - 1.97  DLCO uncorrected ml/min/mmHg 9.75 11.57  DLCO UNC% % 40 50  DLVA Predicted % 51 58  TLC L 4.62 4.32  TLC % Predicted % 91 88  RV % Predicted % 78 77       has a past medical history of Cataract, CHF (congestive heart failure) ([place]) (20/35/5974), Complication of anesthesia, COPD (chronic obstructive pulmonary disease) ([place]), Depression,  Depression, Diverticulitis, DVT (deep venous thrombosis) ([place]), Fibromyalgia, GERD (gastroesophageal reflux disease), Heart murmur, Hiatal hernia, History of deviated nasal septum, HTN (hypertension), Hyperlipidemia, Hypertension, Hypertensive retinopathy, Myocardial infarction ([place]), Obesity, On supplemental oxygen therapy, Osteoporosis, Osteoporosis, Oxygen deficiency, PAT (paroxysmal atrial tachycardia) ([place]), PFO (patent foramen ovale), PULMONARY NODULE, LEFT LOWER LOBE (10/14/2009), PVD (peripheral vascular disease) with claudication ([place]) (12/2017), Right middle lobe pneumonia (07/24/2011), Stroke ([place]), and TOBACCO ABUSE (06/04/2009).   reports that she quit smoking about 10 years ago. Her smoking use included cigarettes. She has a 90.00 pack-year smoking history. She has never used smokeless tobacco.  Past Surgical History:  Procedure Laterality Date  . ABDOMINAL HYSTERECTOMY N/A    Phreesia 11/04/2020  . CARDIOVASCULAR STRESS TEST  12/26/2004   EF 74%. NO EVIDENCE OF ISCHEMIA  . CATARACT EXTRACTION Left    Dr. Elliot Dally  . ESOPHAGOGASTRODUODENOSCOPY (EGD) WITH PROPOFOL N/A 04/15/2015   Procedure: ESOPHAGOGASTRODUODENOSCOPY (EGD) WITH PROPOFOL;  Surgeon: Laurence Spates, MD;  Location: [facility];  Service: Endoscopy;  Laterality: N/A;  . EYE SURGERY Left    Cat Sx  . JOINT REPLACEMENT N/A    Phreesia 11/04/2020  . KNEE  ARTHROSCOPY  2000   left  . LAPAROSCOPIC CHOLECYSTECTOMY  04-16-2010   [facility]  . LOWER EXTREMITY ANGIOGRAPHY N/A 09/09/2017   Procedure: Lower Extremity Angiography;  Surgeon: Lorretta Harp, MD;  Location: [facility];  Service: Cardiovascular;  Laterality: N/A;  . LOWER EXTREMITY INTERVENTION Left 01/17/2018   Procedure: LOWER EXTREMITY INTERVENTION;  Surgeon: Lorretta Harp, MD;  Location: [facility];  Service: Cardiovascular;  Laterality: Left;  . MOUTH SURGERY     03-26-15 multiple extractions stitches remains  . PERIPHERAL VASCULAR INTERVENTION  Left 01/17/2018   Procedure: PERIPHERAL VASCULAR INTERVENTION;  Surgeon: Lorretta Harp, MD;  Location: [facility];  Service: Cardiovascular;  Laterality: Left;  COMMON ILIAC  . RIGHT/LEFT HEART CATH AND CORONARY ANGIOGRAPHY N/A 03/04/2017   Procedure: Right/Left Heart Cath and Coronary Angiography;  Surgeon: Peter M Martinique, MD;  Location: [facility];  Service: Cardiovascular;  Laterality: N/A;  . TOTAL ABDOMINAL HYSTERECTOMY     post op needed oxygen was told "she gave them a scare"  . TUBAL LIGATION    . US ECHOCARDIOGRAPHY  11/20/2009   EF 55-60%    Allergies  Allergen Reactions  . Alprazolam Anaphylaxis and Other (See Comments)    REACTION: stops breathing  . Bee Venom Anaphylaxis  . Iodine Anaphylaxis, Swelling and Other (See Comments)    REACTION: swelling in throat  . Pseudoephedrine Hcl Er Shortness Of Breath  . Budesonide-Formoterol Fumarate Other (See Comments)    Blisters inside of mouth all over  . Crestor [Rosuvastatin Calcium] Other (See Comments)    Unable to walk  . Esomeprazole Magnesium Other (See Comments)    REACTION: "bouncing off walls"  . Flonase [Fluticasone Propionate] Other (See Comments)    NOSE BLEED  . Lamictal [Lamotrigine] Rash    Patient got rash, labored breathing, and diarrhea  . Loratadine Other (See Comments)    claritin D causes shaking  . Lotrimin [Clotrimazole] Other (See Comments)    Mouth blisters  . Lunesta [Eszopiclone] Other (See Comments)    REACTION: "slept for a week"  . Oxcarbazepine Other (See Comments)    Causes deep sleep and dizziness  . Statins Other (See Comments)    Can't walk, legs won't work   . Zolpidem Tartrate Other (See Comments)    REACTION: "slept for a week"  . Betadine [Povidone Iodine] Other (See Comments)    Breathing problems  . Bevespi Aerosphere [Glycopyrrolate-Formoterol] Other (See Comments)    Pt believes this caused mouth sores and thrush   . Clarithromycin Other (See Comments)     All "mycins", Puts into "a" fib, Will take if has to for severe sinus infection  . Effexor [Venlafaxine] Nausea And Vomiting and Other (See Comments)    cramps  . Lexapro [Escitalopram Oxalate] Other (See Comments)    hallucinations  . Aciphex [Rabeprazole Sodium] Rash  . Alendronate Sodium Other (See Comments)    "caused stomach problems for 3 days"  . Avelox [Moxifloxacin Hcl In Nacl] Other (See Comments)    Stomach cramps.   Darlin Coco [Valdecoxib] Rash  . Ceclor [Cefaclor] Rash  . Cephalexin Rash and Other (See Comments)    Pt states that she is possibly allergic to this - had a reaction to Cefaclor in the past and she does not want to these class drugs. Added per patient request.  . Covera-Hs [Verapamil Hcl] Palpitations  . Dicyclomine Hcl Rash  . Other Other (See Comments)    Glue  from ekg/heart monitor leads --rash, Any MYCINS  . Tessalon Perles Rash    Immunization History  Administered Date(s) Administered  . Influenza Split 08/03/2011, 08/01/2012  . Influenza,inj,Quad PF,6+ Mos 08/14/2013, 09/03/2014, 09/04/2015, 08/18/2016  . Influenza-Unspecified 11/03/1999  . Pneumococcal Conjugate-13 10/01/2014  . Pneumococcal Polysaccharide-23 09/03/1999, 06/02/2006, 08/14/2013  . Td 11/02/1994  . Tdap 08/18/2016    Family History  Problem Relation Age of Onset  . Dementia Mother   . Diabetes Mother   . Alzheimer's disease Mother   . Heart attack Brother 62  . Heart attack Father   . Schizophrenia Sister   . Diabetes Sister   . Tremor Sister      Current Outpatient Medications:  .  Alirocumab (PRALUENT) 75 MG/ML SOAJ, Inject 75 mg into the skin every 14 (fourteen) days., Disp: 2 mL, Rfl: 11 .  carvedilol (COREG) 12.5 MG tablet, TAKE 1 TABLET BY MOUTH 2 TIMES DAILY., Disp: 90 tablet, Rfl: 3 .  clopidogrel (PLAVIX) 75 MG tablet, TAKE 1 TABLET BY MOUTH EVERY DAY, Disp: 90 tablet, Rfl: 3 .  desvenlafaxine (PRISTIQ) 50 MG 24 hr tablet, Take 1 tablet (50 mg total) by mouth  daily., Disp: 30 tablet, Rfl: 2 .  diazepam (VALIUM) 2 MG tablet, Take 1 tablet (2 mg total) by mouth as needed for anxiety., Disp: 20 tablet, Rfl: 0 .  diclofenac sodium (VOLTAREN) 1 % GEL, APPLY 2 GRAMS TO BOTH HANDS 4 TIMES A DAY, Disp: , Rfl: 5 .  Eyelid Cleansers (OCUSOFT BABY EYELID & EYELASH EX), Apply topically., Disp: , Rfl:  .  furosemide (LASIX) 40 MG tablet, TAKE 1 TABLET BY MOUTH EVERY DAY, Disp: 90 tablet, Rfl: 3 .  gabapentin (NEURONTIN) 100 MG capsule, Take 200 mg by mouth See admin instructions. Take 1 in am and 1 in the lunch and 2 at bedtime, Disp: , Rfl:  .  HYDROcodone-acetaminophen (NORCO/VICODIN) 5-325 MG tablet, Take 1 tablet by mouth 3 (three) times daily., Disp: , Rfl:  .  ipratropium (ATROVENT) 0.02 % nebulizer solution, USE 1 VIAL BY NEBULIZATION 4 (FOUR) TIMES DAILY., Disp: 62.5 mL, Rfl: 3 .  montelukast (SINGULAIR) 10 MG tablet, TAKE 1 TABLET BY MOUTH EVERYDAY AT BEDTIME, Disp: 90 tablet, Rfl: 2 .  Multiple Vitamins-Minerals (VITAMIN D3 COMPLETE PO), Take by mouth., Disp: , Rfl:  .  Omega-3 Fatty Acids (FISH OIL) 1000 MG CAPS, Take by mouth daily., Disp: , Rfl:  .  ondansetron (ZOFRAN-ODT) 8 MG disintegrating tablet, Take 1 tablet (8 mg total) by mouth every 8 (eight) hours as needed for nausea or vomiting., Disp: 20 tablet, Rfl: 0 .  oxybutynin (DITROPAN-XL) 10 MG 24 hr tablet, TAKE 1 TABLET BY MOUTH EVERY DAY, Disp: 90 tablet, Rfl: 2 .  OXYGEN, Inhale 1.5-2 L into the lungs as needed (for shortness of breath)., Disp: , Rfl:  .  pantoprazole (PROTONIX) 40 MG tablet, Take 1 tablet (40 mg total) by mouth daily., Disp: 90 tablet, Rfl: 1 .  polyethylene glycol (MIRALAX / GLYCOLAX) packet, Take 17 g by mouth daily as needed for mild constipation. , Disp: , Rfl:  .  spironolactone (ALDACTONE) 25 MG tablet, TAKE 1 TABLET BY MOUTH EVERY DAY, Disp: 90 tablet, Rfl: 3 .  tiZANidine (ZANAFLEX) 2 MG tablet, Take 2 mg by mouth at bedtime. Take one in noon and 1 bedtime, Disp: , Rfl:   .  traZODone (DESYREL) 100 MG tablet, Take one tab daily as needed for sleep, Disp: 30 tablet, Rfl: 2 .  triamcinolone cream (  KENALOG) 0.1 %, SMARTSIG:1 Sparingly Topical Twice Daily PRN, Disp: , Rfl:  .  warfarin (COUMADIN) 5 MG tablet, TAKE 1/2 TO 1 TABLET BY MOUTH EVERY DAY OR AS DIRECTED BY COUMADIN CLINIC, Disp: 30 tablet, Rfl: 5 .  nitroGLYCERIN (NITROSTAT) 0.4 MG SL tablet, Place 1 tablet (0.4 mg total) under the tongue every 5 (five) minutes as needed for chest pain., Disp: 90 tablet, Rfl: 3      Objective:   Vitals:   12/19/20 0937  BP: 122/72  Pulse: 74  Temp: (!) 97.3 F (36.3 C)  TempSrc: Oral  SpO2: 94%  Weight: 184 lb 3.2 oz (83.6 kg)  Height: _0  (1.626 m)    Estimated body mass index is 31.62 kg/m as calculated from the following:   Height as of this encounter: _1  (1.626 m).   Weight as of this encounter: 184 lb 3.2 oz (83.6 kg).  _2 @  [facility] - [facility] Weights   12/19/20 0937  Weight: 184 lb 3.2 oz (83.6 kg)     Physical Exam   General: No distress. Looks same Neuro: Alert and Oriented x 3. GCS 15. Speech normal Psych: Pleasant Resp:  Barrel Chest - yes.  Wheeze - no, Crackles - no, No overt respiratory distress CVS: Normal heart sounds. Murmurs - no Ext: Stigmata of Connective Tissue Disease - no6 HEENT: Normal upper airway. PEERL +. No post nasal drip        Assessment:       ICD-10-CM   1. COPD, severe ([place])  J44.9   2. Vaccine counseling  Z71.85   3. Multiple drug allergies  Z88.9   4. History of recent pneumonia  Z87.01        Plan:     Patient Instructions  COPD, severe ([place])   -Stable disease after recent pneumonia in January 2022/December 2021  Plan -Continue using daily nebulizer of i Atrovent 4 times daily -Continue nighttime and exertional oxygen --Holding off on nebulizers for shared decision making -Get pulmonary function test sometime in the next few to several weeks to few months  Vaccine counseling Multiple  drug allergies History of recent pneumonia   -We presume that you have had Covid pneumonia in December 2021/January 2022. -Chest x-ray January 2022 is abnormal  Plan -Check Covid IgG antibody today -Do CT scan of the chest high-resolution without contrast mid March 2022  Follow-up -Return in March 2022 after PFT and CT scan of the chest to see myself or nurse practitioner     SIGNATURE    Dr. Brand Males, M.D., F.C.C.P,  Pulmonary and Critical Care Medicine Staff Physician, [place] Director - Interstitial Lung Disease  Program  Pulmonary [place] [facility] [place], [place], [address]  Pager: [phone], If no answer or between  15:00h - 7:00h: call [phone] Telephone: [phone]  10:10 AM 12/19/2020

## 2020-12-19 NOTE — Addendum Note (Signed)
Addended by: Suzzanne Cloud E on: 12/19/2020 10:16 AM   Modules accepted: Orders

## 2020-12-20 ENCOUNTER — Telehealth: Payer: Self-pay | Admitting: Internal Medicine

## 2020-12-20 LAB — SARS-COV-2 ANTIBODY(IGG)SPIKE,SEMI-QUANTITATIVE: SARS COV1 AB(IGG)SPIKE,SEMI QN: <1 index (ref <1.00)

## 2020-12-20 NOTE — Telephone Encounter (Signed)
She has no antibodies to Covid.  She thought she had Covid in January 2022.  This tells me that what ever she had in general 2022 was not Covid.  Plan -She needs to continue social distancing and masking in order to avoid Covid -She does not have natural immunity -She is allergic to several different medications and scared to get vaccine which I support.  If she is interested she can be referred for monoclonal antibody prophylaxis  EVUSHELD -the order should clearly "amb ref evusheld" - for eval by  Wilber Bihari director"

## 2020-12-20 NOTE — Telephone Encounter (Signed)
Attempted to call pt but line rang busy. Tried to call back but line still rang busy. Unable to reach. Will try to call back later.

## 2020-12-22 ENCOUNTER — Encounter: Payer: Self-pay | Admitting: Family Medicine

## 2020-12-23 ENCOUNTER — Encounter: Payer: Self-pay | Admitting: *Deleted

## 2020-12-23 NOTE — Telephone Encounter (Signed)
Tried to call pt back but again received a busy signal. Due to unable to reach pt after trying multiple attempts, letter will be sent to pt and encounter will be closed. Nothing further needed.

## 2020-12-30 ENCOUNTER — Ambulatory Visit (INDEPENDENT_AMBULATORY_CARE_PROVIDER_SITE_OTHER): Payer: Medicare Other | Admitting: Family Medicine

## 2020-12-30 ENCOUNTER — Other Ambulatory Visit: Payer: Self-pay

## 2020-12-30 ENCOUNTER — Encounter: Payer: Self-pay | Admitting: Family Medicine

## 2020-12-30 VITALS — BP 139/76 | HR 80 | Temp 98.3°F | Ht 64.0 in | Wt 176.0 lb

## 2020-12-30 DIAGNOSIS — J449 Chronic obstructive pulmonary disease, unspecified: Secondary | ICD-10-CM

## 2020-12-30 DIAGNOSIS — R7303 Prediabetes: Secondary | ICD-10-CM

## 2020-12-30 NOTE — Patient Instructions (Addendum)
  No med changes at this time. Thanks for coning in today.    If you have lab work done today you will be contacted with your lab results within the next 2 weeks.  If you have not heard from [place] then please contact us. The fastest way to get your results is to register for My Chart.   IF you received an x-ray today, you will receive an invoice from [facility]. Please contact [facility] Radiology at [phone] with questions or concerns regarding your invoice.   IF you received labwork today, you will receive an invoice from [facility]. Please contact [facility] at [phone] with questions or concerns regarding your invoice.   Our billing staff will not be able to assist you with questions regarding bills from these companies.  You will be contacted with the lab results as soon as they are available. The fastest way to get your results is to activate your My Chart account. Instructions are located on the last page of this paperwork. If you have not heard from [place] regarding the results in 2 weeks, please contact this office.

## 2020-12-30 NOTE — Progress Notes (Signed)
Subjective:  Patient ID: Laurie Horton, female    DOB: 17-Jan-1946  Age: 75 y.o. MRN: [id number]  CC:  Chief Complaint  Patient presents with  . Follow-up    On x-ray and medication review. PT has seen Dr. [name] since last OV. Pt is going have a cancer screening soon pt reports having a CT scan next week. PT reports her current medication works well with no side effects. Pt reports she is doing PT now since last OV.    HPI Laurie Horton presents for   Pulmonary: Recently seen by Dr. [name], February 17 for COPD, chronic hypoxemic respiratory failure and chronic systolic CHF.  Recent pneumonia in January/December. Improving airspace opacities on January 12 x-ray.  Continue daily nebulizer of Atrovent 4 times daily, continue nighttime exertional oxygen supplementation.  Holding off on nebulizers with shared decision making at that time and plan on pulmonary function testing in the next few weeks to few months.  Plan for CT of the chest mid March.  Follow-up after PFT and CT with pulmonary.   Prediabetes: Watching her diet.  Sister and mom with diabetes.  Lab Results  Component Value Date   HGBA1C 6.4 (H) 12/30/2020   Wt Readings from Last 3 Encounters:  12/30/20 176 lb (79.8 kg)  12/19/20 184 lb 3.2 oz (83.6 kg)  11/13/20 181 lb (82.1 kg)   On praluent for hld - followed by cardiology.   History Patient Active Problem List   Diagnosis Date Noted  . Leg pain, bilateral 08/06/2020  . 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 Pain in right shoulder 11/08/2019  . Multiple drug allergies 09/15/2019  . Pain in left shoulder 06/21/2019  . Subclavian artery stenosis, left ([place]) 05/18/2019  . Claudication in peripheral vascular disease ([place]) 08/06/2017  . Class 1 obesity due to excess calories with serious comorbidity and body mass index (BMI) of 31.0 to 31.9 in adult 05/01/2017  . Chronic systolic CHF (congestive heart failure) ([place]) 03/04/2017  .  Acute on chronic respiratory failure with hypoxia ([place]) 02/08/2017  . COPD, severe ([place]) 02/08/2017  . Acute diastolic heart failure, NYHA class 1 ([place]) 02/08/2017  . Fibromyalgia and chronic chest pain 02/08/2017  . Atrial fibrillation with RVR ([place]) 02/08/2017  . Acute heart failure ([place]) 02/08/2017  . Rapid atrial fibrillation ([place]) 02/08/2017  . Shortness of breath 02/08/2017  . Moderate episode of recurrent major depressive disorder ([place]) 01/04/2017  . Generalized anxiety disorder 01/04/2017  . Pure hypercholesterolemia 12/28/2016  . 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 Acute blood loss anemia 08/03/2016  . Carotid stenosis 04/02/2015  . Long term current use of anticoagulant therapy 09/26/2014  . PAF (paroxysmal atrial fibrillation) ([place]) 09/19/2014  . 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 Chronic respiratory failure with hypoxia ([place]) 06/27/2014  . 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 Idiopathic scoliosis and kyphoscoliosis 07/19/2013  . Hyperglycemia, drug-induced 10/12/2011  . Encounter for long-term (current) use of medications 08/21/2011  . Thrush 07/24/2011  . PULMONARY NODULE, LEFT LOWER LOBE 10/14/2009  . PATENT FORAMEN OVALE 10/14/2009  . Essential hypertension 06/04/2009  . G E R D 06/04/2009  . SNORING, HX OF 06/04/2009   Past Medical History:  Diagnosis Date  . Cataract    OD  . CHF (congestive heart failure) ([place]) 03/02/2017   EF 25-30% 2018  . Complication of anesthesia    various issues with oxygen  saturations post  op  . COPD (chronic obstructive pulmonary disease) ([place])   . Depression   . Depression    Phreesia 11/04/2020  . Diverticulitis   . DVT (deep venous thrombosis) ([place])   . Fibromyalgia   . GERD (gastroesophageal reflux disease)   . Heart murmur    Phreesia 11/04/2020  . Hiatal hernia   . History of deviated nasal septum    left- side  . HTN (hypertension)   .  Hyperlipidemia   . Hypertension    Phreesia 11/04/2020  . Hypertensive retinopathy    OU  . Myocardial infarction ([place])    Phreesia 11/04/2020  . Obesity   . On supplemental oxygen therapy    concentrator at night @ 1.5 l/m or when sleeps. O2 Sat niormally 87.  . Osteoporosis   . Osteoporosis    Phreesia 11/04/2020  . Oxygen deficiency    Phreesia 11/04/2020  . PAT (paroxysmal atrial tachycardia) ([place])   . PFO (patent foramen ovale)   . PULMONARY NODULE, LEFT LOWER LOBE 10/14/2009   5mm LLL nodule dec 2010. Stable and 41mm in Oct 2012. No further fu  . PVD (peripheral vascular disease) with claudication ([place]) 12/2017  . Right middle lobe pneumonia 07/24/2011   First noted at admit 07/10/11. Persists on cxr 07/22/11. Cleared on CT 08/24/11. No further followup  . Stroke ([place])   . TOBACCO ABUSE 06/04/2009   Past Surgical History:  Procedure Laterality Date  . ABDOMINAL HYSTERECTOMY N/A    Phreesia 11/04/2020  . CARDIOVASCULAR STRESS TEST  12/26/2004   EF 74%. NO EVIDENCE OF ISCHEMIA  . CATARACT EXTRACTION Left    Dr. Elliot Dally  . ESOPHAGOGASTRODUODENOSCOPY (EGD) WITH PROPOFOL N/A 04/15/2015   Procedure: ESOPHAGOGASTRODUODENOSCOPY (EGD) WITH PROPOFOL;  Surgeon: Laurence Spates, MD;  Location: [facility] ENDOSCOPY;  Service: Endoscopy;  Laterality: N/A;  . EYE SURGERY Left    Cat Sx  . JOINT REPLACEMENT N/A    Phreesia 11/04/2020  . KNEE ARTHROSCOPY  2000   left  . LAPAROSCOPIC CHOLECYSTECTOMY  04-16-2010   [place]  . LOWER EXTREMITY ANGIOGRAPHY N/A 09/09/2017   Procedure: Lower Extremity Angiography;  Surgeon: Lorretta Harp, MD;  Location: [place] CV LAB;  Service: Cardiovascular;  Laterality: N/A;  . LOWER EXTREMITY INTERVENTION Left 01/17/2018   Procedure: LOWER EXTREMITY INTERVENTION;  Surgeon: Lorretta Harp, MD;  Location: [place] CV LAB;  Service: Cardiovascular;  Laterality: Left;  . MOUTH SURGERY     03-26-15 multiple extractions stitches remains  . PERIPHERAL VASCULAR  INTERVENTION Left 01/17/2018   Procedure: PERIPHERAL VASCULAR INTERVENTION;  Surgeon: Lorretta Harp, MD;  Location: [place] [facility] LAB;  Service: Cardiovascular;  Laterality: Left;  COMMON ILIAC  . RIGHT/LEFT HEART CATH AND CORONARY ANGIOGRAPHY N/A 03/04/2017   Procedure: Right/Left Heart Cath and Coronary Angiography;  Surgeon: Peter M Martinique, MD;  Location: [place] CV LAB;  Service: Cardiovascular;  Laterality: N/A;  . TOTAL ABDOMINAL HYSTERECTOMY     post op needed oxygen was told "she gave them a scare"  . TUBAL LIGATION    . US ECHOCARDIOGRAPHY  11/20/2009   EF 55-60%   Allergies  Allergen Reactions  . Alprazolam Anaphylaxis and Other (See Comments)    REACTION: stops breathing  . Bee Venom Anaphylaxis  . Iodine Anaphylaxis, Swelling and Other (See Comments)    REACTION: swelling in throat  . Pseudoephedrine Hcl Er Shortness Of Breath  . Budesonide-Formoterol Fumarate Other (See Comments)    Blisters inside of mouth all over  .  Crestor [Rosuvastatin Calcium] Other (See Comments)    Unable to walk  . Esomeprazole Magnesium Other (See Comments)    REACTION: "bouncing off walls"  . Flonase [Fluticasone Propionate] Other (See Comments)    NOSE BLEED  . Lamictal [Lamotrigine] Rash    Patient got rash, labored breathing, and diarrhea  . Loratadine Other (See Comments)    claritin D causes shaking  . Lotrimin [Clotrimazole] Other (See Comments)    Mouth blisters  . Lunesta [Eszopiclone] Other (See Comments)    REACTION: "slept for a week"  . Oxcarbazepine Other (See Comments)    Causes deep sleep and dizziness  . Statins Other (See Comments)    Can't walk, legs won't work   . Zolpidem Tartrate Other (See Comments)    REACTION: "slept for a week"  . Betadine [Povidone Iodine] Other (See Comments)    Breathing problems  . Bevespi Aerosphere [Glycopyrrolate-Formoterol] Other (See Comments)    Pt believes this caused mouth sores and thrush   . Clarithromycin Other (See  Comments)    All "mycins", Puts into "a" fib, Will take if has to for severe sinus infection  . Effexor [Venlafaxine] Nausea And Vomiting and Other (See Comments)    cramps  . Lexapro [Escitalopram Oxalate] Other (See Comments)    hallucinations  . Aciphex [Rabeprazole Sodium] Rash  . Alendronate Sodium Other (See Comments)    "caused stomach problems for 3 days"  . Avelox [Moxifloxacin Hcl In Nacl] Other (See Comments)    Stomach cramps.   Darlin Coco [Valdecoxib] Rash  . Ceclor [Cefaclor] Rash  . Cephalexin Rash and Other (See Comments)    Pt states that she is possibly allergic to this - had a reaction to Cefaclor in the past and she does not want to these class drugs. Added per patient request.  . Covera-Hs [Verapamil Hcl] Palpitations  . Dicyclomine Hcl Rash  . Other Other (See Comments)    Glue from ekg/heart monitor leads --rash, Any MYCINS  . Tessalon Perles Rash   Prior to Admission medications   Medication Sig Start Date End Date Taking? Authorizing Provider  Alirocumab (PRALUENT) 75 MG/ML SOAJ Inject 75 mg into the skin every 14 (fourteen) days. 08/26/20  Yes Martinique, Peter M, MD  carvedilol (COREG) 12.5 MG tablet TAKE 1 TABLET BY MOUTH 2 TIMES DAILY. 09/12/20  Yes Martinique, Peter M, MD  clopidogrel (PLAVIX) 75 MG tablet TAKE 1 TABLET BY MOUTH EVERY DAY 05/16/20  Yes Martinique, Peter M, MD  desvenlafaxine (PRISTIQ) 50 MG 24 hr tablet Take 1 tablet (50 mg total) by mouth daily. 11/20/20  Yes Arfeen, Arlyce Harman, MD  diazepam (VALIUM) 2 MG tablet Take 1 tablet (2 mg total) by mouth as needed for anxiety. 09/16/20 09/16/21 Yes Arfeen, Arlyce Harman, MD  diclofenac sodium (VOLTAREN) 1 % GEL APPLY 2 GRAMS TO BOTH HANDS 4 TIMES A DAY 06/21/18  Yes [provider]  Eyelid Cleansers (OCUSOFT BABY EYELID & EYELASH EX) Apply topically.   Yes [provider]  furosemide (LASIX) 40 MG tablet TAKE 1 TABLET BY MOUTH EVERY DAY 10/21/20  Yes Martinique, Peter M, MD  gabapentin (NEURONTIN) 100 MG  capsule Take 200 mg by mouth See admin instructions. Take 1 in am and 1 in the lunch and 2 at bedtime 09/16/16  Yes [provider]  HYDROcodone-acetaminophen (NORCO/VICODIN) 5-325 MG tablet Take 1 tablet by mouth 3 (three) times daily.   Yes [provider]  ipratropium (ATROVENT) 0.02 % nebulizer solution USE 1  VIAL BY NEBULIZATION 4 (FOUR) TIMES DAILY. 11/06/20  Yes Brand Males, MD  montelukast (SINGULAIR) 10 MG tablet TAKE 1 TABLET BY MOUTH EVERYDAY AT BEDTIME 05/12/20  Yes Jacelyn Pi, Lilia Argue, MD  Multiple Vitamins-Minerals (VITAMIN D3 COMPLETE PO) Take by mouth.   Yes [provider]  Omega-3 Fatty Acids (FISH OIL) 1000 MG CAPS Take by mouth daily.   Yes [provider]  ondansetron (ZOFRAN-ODT) 8 MG disintegrating tablet Take 1 tablet (8 mg total) by mouth every 8 (eight) hours as needed for nausea or vomiting. 11/13/20  Yes [name] Agreste, MD  oxybutynin (DITROPAN-XL) 10 MG 24 hr tablet TAKE 1 TABLET BY MOUTH EVERY DAY 05/12/20  Yes Jacelyn Pi, Lilia Argue, MD  OXYGEN Inhale 1.5-2 L into the lungs as needed (for shortness of breath).   Yes [provider]  pantoprazole (PROTONIX) 40 MG tablet Take 1 tablet (40 mg total) by mouth daily. 11/18/20  Yes [name] Agreste, MD  polyethylene glycol [facility] / Floria Raveling) packet Take 17 g by mouth daily as needed for mild constipation.    Yes [provider]  spironolactone (ALDACTONE) 25 MG tablet TAKE 1 TABLET BY MOUTH EVERY DAY 02/07/20  Yes Martinique, Peter M, MD  tiZANidine (ZANAFLEX) 2 MG tablet Take 2 mg by mouth at bedtime. Take one in noon and 1 bedtime   Yes [provider]  traZODone (DESYREL) 100 MG tablet Take one tab daily as needed for sleep 11/20/20  Yes Arfeen, Arlyce Harman, MD  triamcinolone cream (KENALOG) 0.1 % SMARTSIG:1 Sparingly Topical Twice Daily PRN 04/18/20  Yes [provider]  warfarin (COUMADIN) 5 MG tablet TAKE 1/2 TO 1 TABLET BY MOUTH EVERY DAY OR AS DIRECTED  BY COUMADIN CLINIC 06/03/20  Yes Martinique, Peter M, MD  nitroGLYCERIN (NITROSTAT) 0.4 MG SL tablet Place 1 tablet (0.4 mg total) under the tongue every 5 (five) minutes as needed for chest pain. 06/17/20 09/15/20  Martinique, Peter M, MD   Social History   Socioeconomic History  . Marital status: Divorced    Spouse name: Not on file  . Number of children: 2  . Years of education: Not on file  . Highest education level: Not on file  Occupational History  . Occupation: disability  Tobacco Use  . Smoking status: Former Smoker    Packs/day: 3.00    Years: 30.00    Pack years: 90.00    Types: Cigarettes    Quit date: 06/02/2010    Years since quitting: 10.5  . Smokeless tobacco: Never Used  . Tobacco comment: QUIT IN 2011  Vaping Use  . Vaping Use: Never used  Substance and Sexual Activity  . Alcohol use: No    Alcohol/week: 0.0 standard drinks  . Drug use: No  . Sexual activity: Yes    Partners: Male    Birth control/protection: Post-menopausal, Surgical  Other Topics Concern  . Not on file  Social History Narrative   Marital status: divorced in 1978 after ten years; dating casually in 2019.      Children: 3 biological children; 3 court appointed children; 6 grandchildren; 5 gg      Lives: alone; children in [place] and [place].      Employment: retired age 6; disability for COPD, CVA at age 53.      Tobacco: former smoker; quit smoking 2011.      Alcohol: none      Exercise: walks dog four times per day; goes to pool three times per  week.      ADLs: drives; no assistant devices; does have a walker.  Cleaning is limited in 2018.  Daughter helps with cleaning.  Does own grocery shopping.      Advanced Directives: YES; DNR/DNI; HCPOA: Rolan Lipa Martin/daughter youngest.  Blind.               Social Determinants of Health   Financial Resource Strain: Not on file  Food Insecurity: Not on file  Transportation Needs: Not on file  Physical Activity: Not on file  Stress: Not  on file  Social Connections: Not on file  Intimate Partner Violence: Not on file   Review of Systems Per HPI.   Objective:   Vitals:   12/30/20 1033  BP: 139/76  Pulse: 80  Temp: 98.3 F (36.8 C)  TempSrc: Temporal  SpO2: 95%  Weight: 176 lb (79.8 kg)  Height: 5\' 4"  (1.626 m)     Physical Exam Vitals reviewed.  Constitutional:      Appearance: She is well-developed and well-nourished.  HENT:     Head: Normocephalic and atraumatic.  Eyes:     Extraocular Movements: EOM normal.     Conjunctiva/sclera: Conjunctivae normal.     Pupils: Pupils are equal, round, and reactive to light.  Neck:     Vascular: No carotid bruit.  Cardiovascular:     Rate and Rhythm: Normal rate. Rhythm irregular.     Pulses: Intact distal pulses.     Heart sounds: Normal heart sounds.  Pulmonary:     Effort: Pulmonary effort is normal. No respiratory distress.     Breath sounds: Normal breath sounds. No stridor. No wheezing.  Abdominal:     Palpations: Abdomen is soft. There is no pulsatile mass.     Tenderness: There is no abdominal tenderness.  Musculoskeletal:     Right lower leg: No edema.     Left lower leg: No edema.  Skin:    General: Skin is warm and dry.  Neurological:     Mental Status: She is alert and oriented to person, place, and time.  Psychiatric:        Mood and Affect: Mood and affect normal.        Behavior: Behavior normal.      Assessment & Plan:  Laurie Horton is a 75 y.o. female . Prediabetes - Plan: Hemoglobin A1c, Comprehensive metabolic panel  -Borderline A1c at 6.4.  Continue to watch diet, ongoing monitoring given family history of diabetes.  COPD, severe ([place])  -Under close care with pulmonary at this time, plan as above with CT scan, PFTs and then follow-up with pulmonary.  RTC precautions given.  No orders of the defined types were placed in this encounter.  Patient Instructions    No med changes at this time. Thanks for coning in today.     If you have lab work done today you will be contacted with your lab results within the next 2 weeks.  If you have not heard from [place] then please contact us. The fastest way to get your results is to register for My Chart.   IF you received an x-ray today, you will receive an invoice from [facility] Radiology. Please contact [facility] Radiology at [phone] with questions or concerns regarding your invoice.   IF you received labwork today, you will receive an invoice from [facility]. Please contact [facility] at [phone] with questions or concerns regarding your invoice.   Our billing staff will not be able to  assist you with questions regarding bills from these companies.  You will be contacted with the lab results as soon as they are available. The fastest way to get your results is to activate your My Chart account. Instructions are located on the last page of this paperwork. If you have not heard from [place] regarding the results in 2 weeks, please contact this office.         Signed, Merri Ray, MD [facility]

## 2020-12-31 ENCOUNTER — Encounter: Payer: Self-pay | Admitting: Family Medicine

## 2020-12-31 LAB — COMPREHENSIVE METABOLIC PANEL
ALT: 11 IU/L (ref 0–32)
AST: 18 IU/L (ref 0–40)
Albumin/Globulin Ratio: 2.2 (ref 1.2–2.2)
Albumin: 4.9 g/dL — ABNORMAL HIGH (ref 3.7–4.7)
Alkaline Phosphatase: 83 IU/L (ref 44–121)
BUN/Creatinine Ratio: 12 (ref 12–28)
BUN: 12 mg/dL (ref 8–27)
Bilirubin Total: 0.5 mg/dL (ref 0.0–1.2)
CO2: 26 mmol/L (ref 20–29)
Calcium: 10.3 mg/dL (ref 8.7–10.3)
Chloride: 93 mmol/L — ABNORMAL LOW (ref 96–106)
Creatinine, Ser: 1 mg/dL (ref 0.57–1.00)
Globulin, Total: 2.2 g/dL (ref 1.5–4.5)
Glucose: 105 mg/dL — ABNORMAL HIGH (ref 65–99)
Potassium: 4.6 mmol/L (ref 3.5–5.2)
Sodium: 136 mmol/L (ref 134–144)
Total Protein: 7.1 g/dL (ref 6.0–8.5)
eGFR: 59 mL/min/{1.73_m2} — ABNORMAL LOW (ref >59)

## 2020-12-31 LAB — HEMOGLOBIN A1C
Est. average glucose Bld gHb Est-mCnc: 137 mg/dL
Hgb A1c MFr Bld: 6.4 % — ABNORMAL HIGH (ref 4.8–5.6)

## 2021-01-10 ENCOUNTER — Other Ambulatory Visit: Payer: Self-pay

## 2021-01-10 ENCOUNTER — Ambulatory Visit (INDEPENDENT_AMBULATORY_CARE_PROVIDER_SITE_OTHER): Payer: Medicare Other

## 2021-01-10 DIAGNOSIS — Z7901 Long term (current) use of anticoagulants: Secondary | ICD-10-CM | POA: Diagnosis not present

## 2021-01-10 DIAGNOSIS — I480 Paroxysmal atrial fibrillation: Secondary | ICD-10-CM

## 2021-01-10 LAB — POCT INR: INR: 2.2 (ref 2.0–3.0)

## 2021-01-10 NOTE — Patient Instructions (Signed)
Continue with 2.5 mg daily, repeat INR in 6 weeks

## 2021-01-15 ENCOUNTER — Ambulatory Visit
Admission: RE | Admit: 2021-01-15 | Discharge: 2021-01-15 | Disposition: A | Discharge status: Home / Self Care | Payer: Medicare Other | Source: Ambulatory Visit | Attending: Internal Medicine | Admitting: Internal Medicine

## 2021-01-15 DIAGNOSIS — J449 Chronic obstructive pulmonary disease, unspecified: Secondary | ICD-10-CM

## 2021-01-24 NOTE — Progress Notes (Signed)
No cancer. No pneumonia. No fibrosois. Has emphysema. Has enlarged PA. Wil give results 01/30/21 visit

## 2021-01-27 ENCOUNTER — Other Ambulatory Visit (HOSPITAL_COMMUNITY)
Admission: RE | Admit: 2021-01-27 | Discharge: 2021-01-27 | Disposition: A | Discharge status: Home / Self Care | Payer: Medicare Other | Source: Ambulatory Visit | Attending: Internal Medicine | Admitting: Internal Medicine

## 2021-01-27 DIAGNOSIS — Z20822 Contact with and (suspected) exposure to covid-19: Secondary | ICD-10-CM | POA: Diagnosis not present

## 2021-01-27 DIAGNOSIS — Z01812 Encounter for preprocedural laboratory examination: Secondary | ICD-10-CM | POA: Diagnosis present

## 2021-01-27 LAB — SARS CORONAVIRUS 2 (TAT 6-24 HRS): SARS Coronavirus 2: NEGATIVE

## 2021-01-30 ENCOUNTER — Encounter: Payer: Self-pay | Admitting: Family Medicine

## 2021-01-30 ENCOUNTER — Other Ambulatory Visit: Payer: Self-pay

## 2021-01-30 ENCOUNTER — Ambulatory Visit (INDEPENDENT_AMBULATORY_CARE_PROVIDER_SITE_OTHER): Payer: Medicare Other | Admitting: Internal Medicine

## 2021-01-30 ENCOUNTER — Encounter: Payer: Self-pay | Admitting: Internal Medicine

## 2021-01-30 VITALS — BP 124/80 | HR 72 | Temp 97.4°F | Ht 64.0 in | Wt 185.2 lb

## 2021-01-30 DIAGNOSIS — Z889 Allergy status to unspecified drugs, medicaments and biological substances status: Secondary | ICD-10-CM

## 2021-01-30 DIAGNOSIS — J449 Chronic obstructive pulmonary disease, unspecified: Secondary | ICD-10-CM | POA: Diagnosis not present

## 2021-01-30 DIAGNOSIS — Z7185 Encounter for immunization safety counseling: Secondary | ICD-10-CM | POA: Diagnosis not present

## 2021-01-30 DIAGNOSIS — Z8701 Personal history of pneumonia (recurrent): Secondary | ICD-10-CM

## 2021-01-30 LAB — PULMONARY FUNCTION TEST
DL/VA % pred: 57 %
DL/VA: 2.35 ml/min/mmHg/L
DLCO cor % pred: 49 %
DLCO cor: 9.45 ml/min/mmHg
DLCO unc % pred: 49 %
DLCO unc: 9.45 ml/min/mmHg
FEF 25-75 Pre: 0.72 L/sec
FEF2575-%Pred-Pre: 43 %
FEV1-%Pred-Pre: 77 %
FEV1-Pre: 1.63 L
FEV1FVC-%Pred-Pre: 83 %
FEV6-%Pred-Pre: 95 %
FEV6-Pre: 2.55 L
FEV6FVC-%Pred-Pre: 102 %
FVC-%Pred-Pre: 92 %
FVC-Pre: 2.61 L
Pre FEV1/FVC ratio: 63 %
Pre FEV6/FVC Ratio: 98 %
RV % pred: 80 %
RV: 1.85 L
TLC % pred: 90 %
TLC: 4.59 L

## 2021-01-30 MED ORDER — BREZTRI AEROSPHERE 160-9-4.8 MCG/ACT IN AERO: 2.0000 | INHALATION_SPRAY | Freq: Two times a day (BID) | RESPIRATORY_TRACT | 0 refills | Status: DC

## 2021-01-30 NOTE — Progress Notes (Signed)
PFT done today. 

## 2021-01-30 NOTE — Telephone Encounter (Signed)
p requesting lab results from scan specialist advised her to seek our recommendation. Please advise

## 2021-01-30 NOTE — Addendum Note (Signed)
Addended byCoralie Keens on: 01/30/2021 05:15 PM   Modules accepted: Orders

## 2021-01-30 NOTE — Patient Instructions (Addendum)
COPD, severe ([place])   -Stable disease after recent pneumonia in January 2022/December 2021 - however 12% decline in lung function in 5 years - too bad you cannot use nebulizer due to lack of tubing back order - other incidental findings in CT  Plan -start BREZTRI 2 puff twice daily - if you like this then no need for  Scheduled neb  - take 8 week sample   - send scriipt for aerochamber = check alpha 1 aT phenotypne  =- use o2 at night - follow other findings in CT with PCP and cardiology  Vaccine counseling Multiple drug allergies History of recent pneumonia   -We presumed that you have had Covid pneumonia in December 2021/January 2022 but I doubt it because Covid IgG march 2022 is negative -Chest x-ray January 2022 is abnormal - CT chest March 2022 is clear with just emphysema - no fibrosis =   Plan -advise referral for EVUSHELD Mab prophylasix against covid but respect right to decline  Follow-up -6 months or sooner - CAT score at followup

## 2021-01-30 NOTE — Progress Notes (Signed)
OV 05/19/2016  Chief Complaint  Patient presents with  . Follow-up    Pt c/o worsening SOB, prod cough with thick white mucus.      This is a routine follow-up. Last visit was in April 2017 with nurse practitioner. At that time treated for COPD exacerbation according to her history but review of the chart does not show that to be true. At this point in time she says COPD stable although she says she might be in flare up on account of her fibromyalgia. Starting her symptoms out it appears that it is generally stable with dyspnea at baseline and cough with mild sputum at baseline. She is more hobbled by her chronic pain and fibromyalgia. Couple months ago apparently a hydrocodone was discontinued by rheumatology and therefore she is having "withdrawal". She does have a new pain medication physician. She is on gabapentin for fibromyalgia. There are no other new issues. She does not want antibiotics or prednisone for her perceived exacerbation  OV 11/17/2016  Chief Complaint  Patient presents with  . Follow-up    Pt states her SOB has worsened since last OV. Pt states she has a burning in her chest when she becomes SOB. Pt c/o prod cough with white mucus in morning, cough becomes nonprod throughout the day.     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 Last CT chest March 2017 without any mass and associated mild cor pulmonale   Six-month follow-up visit. She is completely overwhelmed by her fibromyalgia and depression. The fibromyalgia is worse. She says she is change doctors because of this. She's had a few admissions in the interim but none of them give a COPD exacerbation according to chart review. She suffered frustrated by her heels oxygen system even though it is light it is causing her pain. She uses Atrovent nebulizer but wants change to something else but at the same time has rejected use of any other  nebulizer or oral inhaler because of side effects. She is burning chest pain with inspiration and associated with her costochondral junction trigger points. 2014 review of the chart shows normal cardiac stress test. November 2017 chest x-ray is clear. She does not want any further imaging. Chest pain is mild to severe and variable. Worsened with inspiration. No radiation associated wheezing. No sputum production  OV 05/25/2017  Chief Complaint  Patient presents with  . Follow-up    Pt states her SOB has worsened since last OV in 11/2016. Pt states she only coughs after her neb treatment - pt states her mucus is yellow in color and c/o occ chest discomfort. Pt denies f/c/s.     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 Last CT chest March 2017 without any mass and associated mild cor pulmonale   Follow-up exertional hypoxemia associated with his emphysema. Also associated with fibromyalgia. Last visit she had atypical chest pain. Recommended she see cardiology. 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 According to her history she has significant coronary artery disease but is fairly advanced. She is frustrated with this but realistic. She feels her days are number. In terms of COPD emphysema and is stable. She is on Atrovent inhalers. She is on oxygen. She wants a lighter system. She still burden by fibromyalgia. She does not want to do  any vaccines anymore including flu shot and the new  shingles vaccine   OV 11/22/2017  Chief Complaint  Patient presents with  . 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  She uses oxygen and nebulizers.  She is not interested in rehab or vaccines.  New issue: Preoperative pulmonary evaluation.  She is having significant bilateral lower extremity claudication.  She states that she is in severe pain walking from her door to the mailbox to the point she is almost crying.  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  Apparently Dr. Trula Slade wants to try a laparotomy approach.  However Dr. Broadus John might take her to the cardiac Cath Lab and placed stents.  She says she is sensitive to fentanyl and is worried about anesthesia complications but the pain is so severe she is willing to take the risk.  She has had previous cardiac catheterization without any problems other than being sensitive to fentanyl.  She says this can be done in the cardiac Cath Lab with anesthesia support.  She wants me to talk to Dr. Betsy Coder and Dr. Trula Slade about this.   OV 07/18/2018  Subjective:  Patient ID: [name], female , DOB: 09/04/1946 , age 75 y.o. , MRN: [id number] , ADDRESS: [address] [place] [address]   07/18/2018 -   Chief Complaint  Patient presents with  . Follow-up    Pt states her chest is hurting her all the time now and states she does not think her neb solutions are working for her anymore now. Pt also states she has had some worsening SOB and is also coughing up white phlegm which is comes out in chunks.     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  She uses oxygen and nebulizers. Last CT chest March 2017 without any mass and associated mild cor pulmonale   HPI TEIRRA CARAPIA 75 y.o. -after last visit she  saw nurse practitioner in June 2019 fora mild respiratory flare. At the time treated with allergy medications antihistamines.She tells me thathe last saw me January 2019 she had an iliac stent place in the left side and after that her effort tolerance is improved but in the last few months she's noticed a decrease in effort tolerance with worsening dyspnea and also increased cough and increased sputum production in volume and also consistency without change in color This no fever or weight loss. She will not have a flu shot because of prior allergy. Her last CT scan of the chest was in 2017 and she is requesting for another one. Her inhaler as ipratropium which she says she's not happy with. In the past she's uses Spiriva and Symbicort and these have caused blisters and so she is generally where you have inhalers although she wants something other than her current one.       08/19/2018  - Visit   [name].  Patient was last seen in our office visit on  07/18/2018 started on Bevespi.  Patient reports that after 1 day of use of the Bevespi inhaler she developed thrush and mouth sores.  She she contacted our office to be treated with nystatin.  Patient reports that most mouth sores resolved there remains one.  Patient is also having extensive dental work done.  Patient also has been treated for scabies as well as impetigo by dermatology recently.   Patient completed a high-res CT in 07/29/2018 that showed no real changes from baseline.  Still showing severe emphysema.   Patient reports that she continues to use her Atrovent nebulizer but is wondering if there is any other options available for her.  Patient feels that the Atrovent nebulizer is not working as well.  Patient is currently using as needed and not using it scheduled.  MMRC - Breathlessness Score 3 - I stop for breath after walking about 100 yards or after a few minutes on level ground (isle at grocery  store is 140f)  Patient reports that she has known triggers of shortness of breath with exertion, when there is high pollen counts, when she is walking, when she is outside for extended periods of time.  Patient reports she is been using 2 L via nasal cannula of oxygen with exertion as well as at rest.  Patient reports she forgot her POC at home.  She arrived to our office on room air.  Oxygen saturations 87.  Patient refused oxygen in our office and states that she does not think she needs oxygen right now.  Patient is sad and concerned regarding her limitations with vascular surgery.  Patient believes that she needs a surgery but reports that vascular team as well as cardiology does not think that she would be a good surgical candidate.  She reports that she has not heard back from Dr. BNaida Sleightoffice regarding her most recent test results.   Tests:  01/21/2016-CT chest without contrast- moderate centrilobular emphysema and diffuse bronchial wall thickening mild subpleural density in the dependent lower lobes  01/20/2016-pulmonary function test- airway obstruction and diffusion defect suggesting emphysema  Imaging:  02/11/2017-chest x-ray-stable large cardiac silhouette, lungs are hyperinflated, interstitial edema pattern unchanged from prior scans   Cardiac:  07/08/2017-echocardiogram-LV ejection fraction 50 to 516% grade 1 diastolic dysfunction      OV 12/21/2018  Subjective:  Patient ID: KArmen Pickup female , DOB: 2October 20, 1947, age 75y.o. , MRN: [id number], 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  She uses oxygen and nebulizers. Last CT chest March 2017 without any mass and associated mild cor pulmonale  12/21/2018 -   Chief Complaint  Patient presents with  . Follow-up    Pt states due to being switched to a  new medication, she has had labored breathing. SOB is with exertion, has an occ cough with white phlegm, and also has had some occ CP.     HPI KLILLAR BIANCA89y.o. -  Presents for routine follow-up.  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  She uses oxygen sporadically and nebulizer sporadically.  She is very afraid of medicines because of multiple allergies.  She says her psychiatrist Dr. AMelissa Montaneplaced her on some medications that caused a rash.  Other than that she is okay.      OV  09/15/2019  Subjective:  Patient ID: [name], female , DOB: 1946-01-09 , age 4 y.o. , MRN: [id number] , ADDRESS: [address] [place] [address]  Follow-up emphysema with chronic hypoxemic restorative failure and associated fibromyalgia and associated chronic systolic heart failure: Overall doing well.  She uses oxygen and nebulizers. Last CT chest March 2017 without any mass and associated mild cor pulmonale   09/15/2019 -  No chief complaint on file.    HPI ED RAYSON 75 y.o. -     ROS - per HPI     OV 01/17/2020  Subjective:  Patient ID: [name], female , DOB: 05-Jul-1946 , age 89 y.o. , MRN: [id number] , ADDRESS: [address] [place] [address]   01/17/2020 -   Chief Complaint  Patient presents with  . Follow-up     HPI RAYMIE TRANI 75 y.o. -presents for face-to-face follow-up.  In December she called with a COPD flareup symptoms.  She wanted Biaxin despite her allergies.  She feels that Biaxin helps her.  She says she was given prednisone which helped but she was only given generic clarithromycin instead of the tradename Biaxin.  She says it did not work.  She wants tradename Biaxin only.  She says since then her cough is more than baseline.  She also is like sputum that is discolored.  She feels like she is in exacerbation and this is preventing her from getting the COVID-19  vaccine.  She feels another course of tradename Biaxin is required.  She again does not want generic clarithromycin.  She is compliant with her baseline nebulizer nighttime oxygen.  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  She is now recovering from that.  She talked about Covid vaccine.  She wants to get it.  He has had pneumonia vaccine without problem but recently flu shot she feels this put her in the hospital.  She also has multiple oral drug allergies.  Overall she continues to mask and follow social distancing.       CAT COPD Symptom & Quality of Life Score (GSK trademark) 0 is no burden. 5 is highest burden 07/18/2018  12/21/2018  01/17/2020   Never Cough -> Cough all the time $Remove'3 3 3  'wsaAnPy$ No phlegm in chest -> Chest is full of phlegm $RemoveB'5 3 4  'xRTnrfrp$ No chest tightness -> Chest feels very tight $RemoveBe'4 3 3  'mKPLtEzKG$ No dyspnea for 1 flight stairs/hill -> Very dyspneic for 1 flight of stairs $RemoveB'5 3 5  'YRHAnSmF$ No limitations for ADL at home -> Very limited with ADL at home $Remov'5 4 3  'RxPrHh$ Confident leaving home -> Not at all confident leaving home 0 1 2  Sleep soundly -> Do not sleep soundly because of lung condition $RemoveBefore'3 4 3  'HcTwlZOqxCrRr$ Lots of Energy -> No energy at all $Remo'5 4 4  'xdZTr$ TOTAL Score (max 40)  $Re'30 25 27   'tbQ$ No flowsheet data found.    12/19/2020 -   Chief Complaint  Patient presents with  . Follow-up    Had Covid PNA 10/2020, doing some better     HPI SHAILEY BUTTERBAUGH 75 y.o. -presents for follow-up of her COPD.  She continues to use Atrovent nebulizer.  She prefers nebulizers of inhaler.  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  She uses oxygen at night with exertion.  She tells me that in December  2021 around Christmas she had respiratory viral symptoms.  She says rapid antigen test by her daughter was negative.  Then in January 2022 she followed up with primary care physician who did a chest x-ray that showed pneumonia.  I have the chest x-ray with me  visualized it.  There is an infiltrate.  She says she was told that she had Covid but there is no confirmatory evidence for this.  She treated herself at home.  She cannot have vaccines because of multiple drug allergies according to history.  I supported her in the decision making.  She is worried about abnormal chest x-ray.        CAT Score 12/19/2020  Total CAT Score 18       OV 01/30/2021  Subjective:  Patient ID: [name], female , DOB: 04-17-1946 , age 75 y.o. , MRN: [id number] , ADDRESS: [address], [place] [address] PCP [name] Agreste, MD Patient Care Team: [name] Agreste, MD as PCP - General (Family Medicine) Lorretta Harp, MD as PCP - Cardiology (Cardiology) Margaretha Sheffield, MD as Referring Physician (Physical Medicine and Rehabilitation) Laurence Spates, MD (Inactive) as Consulting Physician (Gastroenterology) Garvin Fila, MD as Consulting Physician (Neurology) Martinique, Peter M, MD as Consulting Physician (Cardiology) Brand Males, MD as Consulting Physician (Pulmonary Disease) Adele Schilder Arlyce Harman, MD as Consulting Physician (Psychiatry) Lorretta Harp, MD as Consulting Physician (Peripheral Vascular Disease)  This Provider for this visit: Treatment Team:  Attending Provider: Brand Males, MD    01/30/2021 -   Chief Complaint  Patient presents with  . Follow-up    Doing ok, breathing is the same     HPI TYNLEE BAYLE 75 y.o. -presents for follow-up for COPD.  Here to review the results.  Her daughter Dottie Vaquerano is on the phone.  No new interim complaints.  A Covid IgG is negative thus making her recent viral infection is unlikely as Covid.  I offered a referral for Covid monoclonal antibody prophylaxis but she declined.  Her pulmonary function test shows 12% decline in FEV1 in 5 years.  I reviewed the chart and do not find an alpha-1 check.  I presume this was done prior to [place] using electronic medical records.   She is open to getting it tested again.  She is to go since 2 COPD.  She tells me that the DME company is out of supply with tubing for her nebulizer.  She has used an inhaler but she is somewhat skeptical because of nasal septal perforation.  Explained to her inhalers oral.  She then talked about dentures.  Explained that we can do inhalers through an [facility].  She had high-resolution CT chest.  This shows emphysema.  There is evidence of cor pulmonale.  There is stable thoracic aorta aneurysm.  No fibrosis no cancer.   CAT Score 01/30/2021 12/19/2020  Total CAT Score 10 18        Ref Range & Units 1 mo ago  SARS COV1 AB(IGG)SPIKE,SEMI QN <1.00 index <1.00       PFT  PFT Results Latest Ref Rng & Units 01/30/2021 01/20/2016 08/14/2013  FVC-Pre L 2.61 2.79 -  FVC-Predicted Pre % 92 93 100  FVC-Post L - - 3.07  FVC-Predicted Post % - - 103  Pre FEV1/FVC % % 63 67 62  Post FEV1/FCV % % - - 64  FEV1-Pre L 1.63 1.86 1.84  FEV1-Predicted Pre % 77 82 82  FEV1-Post L - -  1.97  DLCO uncorrected ml/min/mmHg 9.45 9.75 11.57  DLCO UNC% % 49 40 50  DLCO corrected ml/min/mmHg 9.45 - -  DLCO COR %Predicted % 49 - -  DLVA Predicted % 57 51 58  TLC L 4.59 4.62 4.32  TLC % Predicted % 90 91 88  RV % Predicted % 80 78 77     IMPRESSION: 1. No evidence of interstitial lung disease. Air trapping is indicative of small airways disease. 2. Ascending aortic aneurysm, stable. Recommend annual imaging followup by CTA or MRA. This recommendation follows 2010 ACCF/AHA/AATS/ACR/ASA/SCA/SCAI/SIR/STS/SVM Guidelines for the Diagnosis and Management of Patients with Thoracic Aortic Disease. Circulation. 2010; 121: V893-Y101. Aortic aneurysm NOS (ICD10-I71.9). 3. Aortic atherosclerosis (ICD10-I70.0). Coronary artery calcification. 4. Enlarged pulmonic trunk, indicative of pulmonary arterial hypertension. 5.  Emphysema (ICD10-J43.9).   Electronically Signed   By: Lorin Picket M.D.   On:  01/15/2021 11:52    has a past medical history of Cataract, CHF (congestive heart failure) ([place]) (75/08/2584), Complication of anesthesia, COPD (chronic obstructive pulmonary disease) ([place]), Depression, Depression, Diverticulitis, DVT (deep venous thrombosis) ([place]), Fibromyalgia, GERD (gastroesophageal reflux disease), Heart murmur, Hiatal hernia, History of deviated nasal septum, HTN (hypertension), Hyperlipidemia, Hypertension, Hypertensive retinopathy, Myocardial infarction ([place]), Obesity, On supplemental oxygen therapy, Osteoporosis, Osteoporosis, Oxygen deficiency, PAT (paroxysmal atrial tachycardia) ([place]), PFO (patent foramen ovale), PULMONARY NODULE, LEFT LOWER LOBE (10/14/2009), PVD (peripheral vascular disease) with claudication ([place]) (12/2017), Right middle lobe pneumonia (07/24/2011), Stroke ([place]), and TOBACCO ABUSE (06/04/2009).   reports that she quit smoking about 10 years ago. Her smoking use included cigarettes. She has a 90.00 pack-year smoking history. She has never used smokeless tobacco.  Past Surgical History:  Procedure Laterality Date  . ABDOMINAL HYSTERECTOMY N/A    Phreesia 11/04/2020  . CARDIOVASCULAR STRESS TEST  12/26/2004   EF 74%. NO EVIDENCE OF ISCHEMIA  . CATARACT EXTRACTION Left    Dr. Elliot Dally  . ESOPHAGOGASTRODUODENOSCOPY (EGD) WITH PROPOFOL N/A 04/15/2015   Procedure: ESOPHAGOGASTRODUODENOSCOPY (EGD) WITH PROPOFOL;  Surgeon: Laurence Spates, MD;  Location: [facility];  Service: Endoscopy;  Laterality: N/A;  . EYE SURGERY Left    Cat Sx  . JOINT REPLACEMENT N/A    Phreesia 11/04/2020  . KNEE ARTHROSCOPY  2000   left  . LAPAROSCOPIC CHOLECYSTECTOMY  04-16-2010   [facility]  . LOWER EXTREMITY ANGIOGRAPHY N/A 09/09/2017   Procedure: Lower Extremity Angiography;  Surgeon: Lorretta Harp, MD;  Location: [facility];  Service: Cardiovascular;  Laterality: N/A;  . LOWER EXTREMITY INTERVENTION Left 01/17/2018   Procedure: LOWER EXTREMITY INTERVENTION;  Surgeon:  Lorretta Harp, MD;  Location: [facility];  Service: Cardiovascular;  Laterality: Left;  . MOUTH SURGERY     03-26-15 multiple extractions stitches remains  . PERIPHERAL VASCULAR INTERVENTION Left 01/17/2018   Procedure: PERIPHERAL VASCULAR INTERVENTION;  Surgeon: Lorretta Harp, MD;  Location: [facility];  Service: Cardiovascular;  Laterality: Left;  COMMON ILIAC  . RIGHT/LEFT HEART CATH AND CORONARY ANGIOGRAPHY N/A 03/04/2017   Procedure: Right/Left Heart Cath and Coronary Angiography;  Surgeon: Peter M Martinique, MD;  Location: [facility];  Service: Cardiovascular;  Laterality: N/A;  . TOTAL ABDOMINAL HYSTERECTOMY     post op needed oxygen was told "she gave them a scare"  . TUBAL LIGATION    . US ECHOCARDIOGRAPHY  11/20/2009   EF 55-60%    Allergies  Allergen Reactions  . Alprazolam Anaphylaxis and Other (See Comments)    REACTION: stops breathing  .  Bee Venom Anaphylaxis  . Iodine Anaphylaxis, Swelling and Other (See Comments)    REACTION: swelling in throat  . Pseudoephedrine Hcl Er Shortness Of Breath  . Budesonide-Formoterol Fumarate Other (See Comments)    Blisters inside of mouth all over  . Crestor [Rosuvastatin Calcium] Other (See Comments)    Unable to walk  . Esomeprazole Magnesium Other (See Comments)    REACTION: "bouncing off walls"  . Flonase [Fluticasone Propionate] Other (See Comments)    NOSE BLEED  . Lamictal [Lamotrigine] Rash    Patient got rash, labored breathing, and diarrhea  . Loratadine Other (See Comments)    claritin D causes shaking  . Lotrimin [Clotrimazole] Other (See Comments)    Mouth blisters  . Lunesta [Eszopiclone] Other (See Comments)    REACTION: "slept for a week"  . Oxcarbazepine Other (See Comments)    Causes deep sleep and dizziness  . Statins Other (See Comments)    Can't walk, legs won't work   . Zolpidem Tartrate Other (See Comments)    REACTION: "slept for a week"  . Betadine [Povidone Iodine] Other  (See Comments)    Breathing problems  . Bevespi Aerosphere [Glycopyrrolate-Formoterol] Other (See Comments)    Pt believes this caused mouth sores and thrush   . Clarithromycin Other (See Comments)    All "mycins", Puts into "a" fib, Will take if has to for severe sinus infection  . Effexor [Venlafaxine] Nausea And Vomiting and Other (See Comments)    cramps  . Lexapro [Escitalopram Oxalate] Other (See Comments)    hallucinations  . Aciphex [Rabeprazole Sodium] Rash  . Alendronate Sodium Other (See Comments)    "caused stomach problems for 3 days"  . Avelox [Moxifloxacin Hcl In Nacl] Other (See Comments)    Stomach cramps.   Marlowe Aschoff [Valdecoxib] Rash  . Ceclor [Cefaclor] Rash  . Cephalexin Rash and Other (See Comments)    Pt states that she is possibly allergic to this - had a reaction to Cefaclor in the past and she does not want to these class drugs. Added per patient request.  . Covera-Hs [Verapamil Hcl] Palpitations  . Dicyclomine Hcl Rash  . Other Other (See Comments)    Glue from ekg/heart monitor leads --rash, Any MYCINS  . Tessalon Perles Rash    Immunization History  Administered Date(s) Administered  . Influenza Split 08/03/2011, 08/01/2012  . Influenza,inj,Quad PF,6+ Mos 08/14/2013, 09/03/2014, 09/04/2015, 08/18/2016  . Influenza-Unspecified 11/03/1999  . Pneumococcal Conjugate-13 10/01/2014  . Pneumococcal Polysaccharide-23 09/03/1999, 06/02/2006, 08/14/2013  . Td 11/02/1994  . Tdap 08/18/2016    Family History  Problem Relation Age of Onset  . Dementia Mother   . Diabetes Mother   . Alzheimer's disease Mother   . Heart attack Brother 57  . Heart attack Father   . Schizophrenia Sister   . Diabetes Sister   . Tremor Sister      Current Outpatient Medications:  .  Alirocumab (PRALUENT) 75 MG/ML SOAJ, Inject 75 mg into the skin every 14 (fourteen) days., Disp: 2 mL, Rfl: 11 .  carvedilol (COREG) 12.5 MG tablet, TAKE 1 TABLET BY MOUTH 2 TIMES DAILY., Disp:  90 tablet, Rfl: 3 .  clopidogrel (PLAVIX) 75 MG tablet, TAKE 1 TABLET BY MOUTH EVERY DAY, Disp: 90 tablet, Rfl: 3 .  desvenlafaxine (PRISTIQ) 50 MG 24 hr tablet, Take 1 tablet (50 mg total) by mouth daily., Disp: 30 tablet, Rfl: 2 .  diclofenac sodium (VOLTAREN) 1 % GEL, APPLY 2 GRAMS TO BOTH HANDS  4 TIMES A DAY, Disp: , Rfl: 5 .  Eyelid Cleansers (OCUSOFT BABY EYELID & EYELASH EX), Apply topically., Disp: , Rfl:  .  furosemide (LASIX) 40 MG tablet, TAKE 1 TABLET BY MOUTH EVERY DAY, Disp: 90 tablet, Rfl: 3 .  gabapentin (NEURONTIN) 100 MG capsule, Take 200 mg by mouth See admin instructions. Take 1 in am and 1 in the lunch and 2 at bedtime, Disp: , Rfl:  .  HYDROcodone-acetaminophen (NORCO/VICODIN) 5-325 MG tablet, Take 1 tablet by mouth 3 (three) times daily., Disp: , Rfl:  .  ipratropium (ATROVENT) 0.02 % nebulizer solution, USE 1 VIAL BY NEBULIZATION 4 (FOUR) TIMES DAILY., Disp: 62.5 mL, Rfl: 3 .  montelukast (SINGULAIR) 10 MG tablet, TAKE 1 TABLET BY MOUTH EVERYDAY AT BEDTIME, Disp: 90 tablet, Rfl: 2 .  Multiple Vitamins-Minerals (VITAMIN D3 COMPLETE PO), Take by mouth., Disp: , Rfl:  .  Omega-3 Fatty Acids (FISH OIL) 1000 MG CAPS, Take by mouth daily., Disp: , Rfl:  .  ondansetron (ZOFRAN-ODT) 8 MG disintegrating tablet, Take 1 tablet (8 mg total) by mouth every 8 (eight) hours as needed for nausea or vomiting., Disp: 20 tablet, Rfl: 0 .  oxybutynin (DITROPAN-XL) 10 MG 24 hr tablet, TAKE 1 TABLET BY MOUTH EVERY DAY, Disp: 90 tablet, Rfl: 2 .  OXYGEN, Inhale 1.5-2 L into the lungs as needed (for shortness of breath)., Disp: , Rfl:  .  pantoprazole (PROTONIX) 40 MG tablet, Take 1 tablet (40 mg total) by mouth daily., Disp: 90 tablet, Rfl: 1 .  polyethylene glycol (MIRALAX / GLYCOLAX) packet, Take 17 g by mouth daily as needed for mild constipation. , Disp: , Rfl:  .  spironolactone (ALDACTONE) 25 MG tablet, TAKE 1 TABLET BY MOUTH EVERY DAY, Disp: 90 tablet, Rfl: 3 .  tiZANidine (ZANAFLEX) 2 MG  tablet, Take 2 mg by mouth at bedtime. Take one in noon and 1 bedtime, Disp: , Rfl:  .  traZODone (DESYREL) 100 MG tablet, Take one tab daily as needed for sleep, Disp: 30 tablet, Rfl: 2 .  triamcinolone cream (KENALOG) 0.1 %, SMARTSIG:1 Sparingly Topical Twice Daily PRN, Disp: , Rfl:  .  warfarin (COUMADIN) 5 MG tablet, TAKE 1/2 TO 1 TABLET BY MOUTH EVERY DAY OR AS DIRECTED BY COUMADIN CLINIC, Disp: 30 tablet, Rfl: 5 .  nitroGLYCERIN (NITROSTAT) 0.4 MG SL tablet, Place 1 tablet (0.4 mg total) under the tongue every 5 (five) minutes as needed for chest pain., Disp: 90 tablet, Rfl: 3      Objective:   Vitals:   01/30/21 1038  BP: 124/80  Pulse: 72  Temp: (!) 97.4 F (36.3 C)  TempSrc: Oral  SpO2: 90%  Weight: 185 lb 3.2 oz (84 kg)  Height: $Remove'5\' 4"'MOymwra$  (1.626 m)    Estimated body mass index is 31.79 kg/m as calculated from the following:   Height as of this encounter: $RemoveBeforeD'5\' 4"'CoQlnxERGdTnmM$  (1.626 m).   Weight as of this encounter: 185 lb 3.2 oz (84 kg).  $Rem'@WEIGHTCHANGE'XiIW$ @  [employer]   01/30/21 1038  Weight: 185 lb 3.2 oz (84 kg)     Physical Exam   General: No distress. Looks well Neuro: Alert and Oriented x 3. GCS 15. Speech normal Psych: Pleasant Resp:  Barrel Chest - yes  Wheeze - no, Crackles - no, No overt respiratory distress CVS: Normal heart sounds. Murmurs - noo Ext: Stigmata of Connective Tissue Disease - no HEENT: Normal upper airway. PEERL +. No post nasal drip  Assessment:       ICD-10-CM   1. 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 Vaccine counseling  Z71.85   3. Multiple drug allergies  Z88.9   4. History of recent pneumonia  Z87.01        Plan:     Patient Instructions  COPD, severe ([place])   -Stable disease after recent pneumonia in January 2022/December 2021 - however 12% decline in lung function in 5 years - too bad you cannot use nebulizer due to lack of tubing back order - other incidental findings in CT  Plan -start BREZTRI  2 puff twice daily - if you like this then no need for  Scheduled neb  - take 8 week sample   - send scriipt for aerochamber = check alpha 1 aT phenotypne  =- use o2 at night - follow other findings in CT with PCP and cardiology  Vaccine counseling Multiple drug allergies History of recent pneumonia   -We presumed that you have had Covid pneumonia in December 2021/January 2022 but I doubt it because Covid IgG march 2022 is negative -Chest x-ray January 2022 is abnormal - CT chest March 2022 is clear with just emphysema - no fibrosis =   Plan -advise referral for EVUSHELD Mab prophylasix against covid but respect right to decline  Follow-up -6 months or sooner - CAT score at followup     SIGNATURE    Dr. Brand Males, M.D., F.C.C.P,  Pulmonary and Critical Care Medicine Staff Physician, [place] Director - Interstitial Lung Disease  Program  [facility] [place], [place], [id number]  Pager: [phone], If no answer or between  15:00h - 7:00h: call [phone] Telephone: [phone]  11:11 AM 01/30/2021

## 2021-02-03 NOTE — Telephone Encounter (Signed)
Patient sent emali regarding her CT scan results that you and her discussed.   Dear Doctor, Sent message to Dr Carlota Raspberry re enlarged liver as was first seen in my results by Dr Veneda Melter.  We also discussed the issue and you told me to contact my primary.    When looking at the results today the wording has changed and there are no mention of this problem.  Dr. Carlota Raspberry told me it was not in there now.  I saw it, you saw it, and it was discussed.  Can you please contact Dr Carlota Raspberry and tell him I am not crazy and that it did state enlarged liver so he will help me?  Sending to Dr. [name] for further recommendations.

## 2021-02-03 NOTE — Telephone Encounter (Signed)
Pt states her results were changed after she looked at them.   I am fairly certain results cannot be edited after they have been entered? How should I advise?

## 2021-02-03 NOTE — Telephone Encounter (Signed)
The 2017 CT abd does say, "3. Mild diffuse hepatic steatosis. Question irregular liver surface, cannot exclude cirrhosis. Consider hepatic elastography for further liver fibrosis risk stratification, as clinically warranted.  PCP [name] Agreste, MD to address

## 2021-02-04 ENCOUNTER — Other Ambulatory Visit: Payer: Self-pay | Admitting: Cardiology

## 2021-02-07 NOTE — Progress Notes (Signed)
Cardiology Office Note    Date:  02/12/2021   ID:  [name], [name] 03-30-1946, MRN [id number]  PCP:  Shade Flood, MD  Cardiologist: Dr. [place]   Chief Complaint  Patient presents with  . Congestive Heart Failure  . Atrial Fibrillation    History of Present Illness:    Laurie Horton is a 75 y.o. female with past medical history of chronic diastolic CHF, PFO, PAF (on Coumadin), COPD (on 2L [place] at baseline), HTN, and prior CVA who is seen for follow up CHF and PAD.  She was admitted from 4/9 - 02/12/2017 for worsening dyspnea on exertion and palpitations. Was in atrial fibrillation with RVR upon arrival to the ED. Echo during admission showed a newly reduced EF of 20-25% and she was diuresed with IV Lasix. Enzymes were negative and EKG showed no acute ischemic changes. It was recommended to consider a right/left heart cath in 4-6 weeks.   She did undergo right and left heart cath on 03/04/17. This showed severe 2 vessel obstructive CAD with 100% RCA occlusion, 75% OM1, and 90% small OM2. EF 25-30%. Mild pulmonary HTN with normal LV filling pressures. 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 Maximizing CHF therapy recommended.  She did have repeat Echo in September 2018 showing improvement in EF to 50-55%.   Subsequent to this she developed significant claudication. She was seen by Dr. Allyson Sabal and had angiography showing 80% infrarenal aortic stenosis and left iliac stenosis. She also had severe right common femoral stenosis. She was seen by Dr Myra Gianotti for consideration of Aortobifemoral bypass. After pulmonary evaluation she was felt to be too high a risk for open surgery. In March 2019 she underwent atherectomy and covered stenting of the left iliac by Dr. Allyson Sabal. On follow up she did have improvement in her claudication and ABIs. The aortic and right common femoral artery stenoses are not felt to be amenable to percutaneous therapy.   In early July 2019 she was seen because she  felt she was in Afib following dental procedure. On arrival she was in NSR with PACs. No medical changes made.   She has been followed by Dr Allyson Sabal and  dopplers in May 2020 indicated continued patency of the stent. No significant claudication. She was noted to have a high grade left subclavian stenosis but it was unclear that this was symptomatic and given all her medical problems was felt best to manage medically. She had left shoulder injection by Dr Cleophas Dunker for adhesive capsulitis. She notes this has helped with her pain significantly. She is followed by pulmonary for COPD.   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 INR had been therapeutic. At time of infarct it was 1.9. We decided to add plavix 75 mg daily due to her extensive vascular disease. MRI showed no acute infarct but she did have evidence of multiple old strokes.   She had follow up LE arterial dopplers in May 2021 which were stable. Carotid dopplers in January were unchanged.  She had PNA in Dec/Jan. Followed by Dr Colletta Maryland for COPD exacerbation. Had CT done. Aortic size at 4.1 cm. She is very concerned about this.   She reports chronic SOB. No significant angina. Only pain is in her ribs beneath her right breast. Mild palpitations. Claudication symptoms are stable. Hasn't smoked in over 10 years.  Past Medical History:  Diagnosis Date  . Cataract  OD  . CHF (congestive heart failure) (HCC) 03/02/2017   EF 25-30% 2018  . Complication of anesthesia    various issues with oxygen  saturations post op  . COPD (chronic obstructive pulmonary disease) (HCC)   . Depression   . Depression    Phreesia 11/04/2020  . Diverticulitis   . DVT (deep venous thrombosis) (HCC)   . Fibromyalgia   . GERD (gastroesophageal reflux disease)   . Heart murmur    Phreesia 11/04/2020  . Hiatal hernia   . History of deviated nasal septum    left- side  . HTN (hypertension)   .  Hyperlipidemia   . Hypertension    Phreesia 11/04/2020  . Hypertensive retinopathy    OU  . Myocardial infarction (HCC)    Phreesia 11/04/2020  . Obesity   . On supplemental oxygen therapy    concentrator at night @ 1.5 l/m or when sleeps. O2 Sat niormally 87.  . Osteoporosis   . Osteoporosis    Phreesia 11/04/2020  . Oxygen deficiency    Phreesia 11/04/2020  . PAT (paroxysmal atrial tachycardia) (HCC)   . PFO (patent foramen ovale)   . PULMONARY NODULE, LEFT LOWER LOBE 10/14/2009   5mm LLL nodule dec 2010. Stable and 4mm in Oct 2012. No further fu  . PVD (peripheral vascular disease) with claudication (HCC) 12/2017  . Right middle lobe pneumonia 07/24/2011   First noted at admit 07/10/11. Persists on cxr 07/22/11. Cleared on CT 08/24/11. No further followup  . Stroke (HCC)   . TOBACCO ABUSE 06/04/2009    Past Surgical History:  Procedure Laterality Date  . ABDOMINAL HYSTERECTOMY N/A    Phreesia 11/04/2020  . CARDIOVASCULAR STRESS TEST  12/26/2004   EF 74%. NO EVIDENCE OF ISCHEMIA  . CATARACT EXTRACTION Left    Dr. Hortense Ramal  . ESOPHAGOGASTRODUODENOSCOPY (EGD) WITH PROPOFOL N/A 04/15/2015   Procedure: ESOPHAGOGASTRODUODENOSCOPY (EGD) WITH PROPOFOL;  Surgeon: Carman Ching, MD;  Location: [facility];  Service: Endoscopy;  Laterality: N/A;  . EYE SURGERY Left    Cat Sx  . JOINT REPLACEMENT N/A    Phreesia 11/04/2020  . KNEE ARTHROSCOPY  2000   left  . LAPAROSCOPIC CHOLECYSTECTOMY  04-16-2010   [facility]  . LOWER EXTREMITY ANGIOGRAPHY N/A 09/09/2017   Procedure: Lower Extremity Angiography;  Surgeon: Runell Gess, MD;  Location: [facility] INVASIVE CV LAB;  Service: Cardiovascular;  Laterality: N/A;  . LOWER EXTREMITY INTERVENTION Left 01/17/2018   Procedure: LOWER EXTREMITY INTERVENTION;  Surgeon: Runell Gess, MD;  Location: [facility] INVASIVE CV LAB;  Service: Cardiovascular;  Laterality: Left;  . MOUTH SURGERY     03-26-15 multiple extractions stitches remains  . PERIPHERAL VASCULAR  INTERVENTION Left 01/17/2018   Procedure: PERIPHERAL VASCULAR INTERVENTION;  Surgeon: Runell Gess, MD;  Location: [facility] INVASIVE CV LAB;  Service: Cardiovascular;  Laterality: Left;  COMMON ILIAC  . RIGHT/LEFT HEART CATH AND CORONARY ANGIOGRAPHY N/A 03/04/2017   Procedure: Right/Left Heart Cath and Coronary Angiography;  Surgeon: Neeta Storey M [place], MD;  Location: [facility] INVASIVE CV LAB;  Service: Cardiovascular;  Laterality: N/A;  . TOTAL ABDOMINAL HYSTERECTOMY     post op needed oxygen was told "she gave them a scare"  . TUBAL LIGATION    . US ECHOCARDIOGRAPHY  11/20/2009   EF 55-60%    Current Medications: Outpatient Medications Prior to Visit  Medication Sig Dispense Refill  . Alirocumab (PRALUENT) 75 MG/ML SOAJ Inject 75 mg into the skin every 14 (fourteen) days. 2 mL 11  .  Budeson-Glycopyrrol-Formoterol (BREZTRI AEROSPHERE) 160-9-4.8 MCG/ACT AERO Inhale 2 puffs into the lungs in the morning and at bedtime. 4.8 g 0  . carvedilol (COREG) 12.5 MG tablet TAKE 1 TABLET BY MOUTH 2 TIMES DAILY. 90 tablet 3  . clopidogrel (PLAVIX) 75 MG tablet TAKE 1 TABLET BY MOUTH EVERY DAY 90 tablet 3  . desvenlafaxine (PRISTIQ) 50 MG 24 hr tablet Take 1 tablet (50 mg total) by mouth daily. 30 tablet 2  . diclofenac sodium (VOLTAREN) 1 % GEL APPLY 2 GRAMS TO BOTH HANDS 4 TIMES A DAY  5  . Eyelid Cleansers (OCUSOFT BABY EYELID & EYELASH EX) Apply topically.    . furosemide (LASIX) 40 MG tablet TAKE 1 TABLET BY MOUTH EVERY DAY 90 tablet 3  . gabapentin (NEURONTIN) 100 MG capsule Take 200 mg by mouth See admin instructions. Take 1 in am and 1 in the lunch and 2 at bedtime    . HYDROcodone-acetaminophen (NORCO/VICODIN) 5-325 MG tablet Take 1 tablet by mouth 3 (three) times daily.    Marland Kitchen ipratropium (ATROVENT) 0.02 % nebulizer solution USE 1 VIAL BY NEBULIZATION 4 (FOUR) TIMES DAILY. 62.5 mL 3  . montelukast (SINGULAIR) 10 MG tablet TAKE 1 TABLET BY MOUTH EVERYDAY AT BEDTIME 90 tablet 2  . Multiple Vitamins-Minerals  (VITAMIN D3 COMPLETE PO) Take by mouth.    . Omega-3 Fatty Acids (FISH OIL) 1000 MG CAPS Take by mouth daily.    . ondansetron (ZOFRAN-ODT) 8 MG disintegrating tablet Take 1 tablet (8 mg total) by mouth every 8 (eight) hours as needed for nausea or vomiting. 20 tablet 0  . oxybutynin (DITROPAN-XL) 10 MG 24 hr tablet TAKE 1 TABLET BY MOUTH EVERY DAY 90 tablet 2  . OXYGEN Inhale 1.5-2 L into the lungs as needed (for shortness of breath).    . pantoprazole (PROTONIX) 40 MG tablet Take 1 tablet (40 mg total) by mouth daily. 90 tablet 1  . polyethylene glycol (MIRALAX / GLYCOLAX) packet Take 17 g by mouth daily as needed for mild constipation.     Marland Kitchen spironolactone (ALDACTONE) 25 MG tablet TAKE 1 TABLET BY MOUTH EVERY DAY 30 tablet 0  . tiZANidine (ZANAFLEX) 2 MG tablet Take 2 mg by mouth at bedtime. Take one in noon and 1 bedtime    . traZODone (DESYREL) 100 MG tablet Take one tab daily as needed for sleep 30 tablet 2  . triamcinolone cream (KENALOG) 0.1 % SMARTSIG:1 Sparingly Topical Twice Daily PRN    . warfarin (COUMADIN) 5 MG tablet TAKE 1/2 TO 1 TABLET BY MOUTH EVERY DAY OR AS DIRECTED BY COUMADIN CLINIC 30 tablet 5  . nitroGLYCERIN (NITROSTAT) 0.4 MG SL tablet Place 1 tablet (0.4 mg total) under the tongue every 5 (five) minutes as needed for chest pain. 90 tablet 3   No facility-administered medications prior to visit.     Allergies:   Alprazolam, Bee venom, Iodine, Pseudoephedrine hcl er, Budesonide-formoterol fumarate, Crestor [rosuvastatin calcium], Esomeprazole magnesium, Flonase [fluticasone propionate], Lamictal [lamotrigine], Loratadine, Lotrimin [clotrimazole], Lunesta [eszopiclone], Oxcarbazepine, Statins, Zolpidem tartrate, Betadine [povidone iodine], Bevespi aerosphere [glycopyrrolate-formoterol], Clarithromycin, Effexor [venlafaxine], Lexapro [escitalopram oxalate], Aciphex [rabeprazole sodium], Alendronate sodium, Avelox [moxifloxacin hcl in nacl], Bextra [valdecoxib], Ceclor  [cefaclor], Cephalexin, Covera-hs [verapamil hcl], Dicyclomine hcl, Other, and Tessalon perles   Social History   Socioeconomic History  . Marital status: Divorced    Spouse name: Not on file  . Number of children: 2  . Years of education: Not on file  . Highest education level: Not on file  Occupational History  . Occupation: disability  Tobacco Use  . Smoking status: Former Smoker    Packs/day: 3.00    Years: 30.00    Pack years: 90.00    Types: Cigarettes    Quit date: 06/02/2010    Years since quitting: 10.7  . Smokeless tobacco: Never Used  . Tobacco comment: QUIT IN 2011  Vaping Use  . Vaping Use: Never used  Substance and Sexual Activity  . Alcohol use: No    Alcohol/week: 0.0 standard drinks  . Drug use: No  . Sexual activity: Yes    Partners: Male    Birth control/protection: Post-menopausal, Surgical  Other Topics Concern  . Not on file  Social History Narrative   Marital status: divorced in 1978 after ten years; dating casually in 2019.      Children: 3 biological children; 3 court appointed children; 6 grandchildren; 5 gg      Lives: alone; children in [place] and [place].      Employment: retired age 58; disability for COPD, CVA at age 74.      Tobacco: former smoker; quit smoking 2011.      Alcohol: none      Exercise: walks dog four times per day; goes to pool three times per week.      ADLs: drives; no assistant devices; does have a walker.  Cleaning is limited in 2018.  Daughter helps with cleaning.  Does own grocery shopping.      Advanced Directives: YES; DNR/DNI; HCPOA: [name] Martin/daughter youngest.  Blind.               Social Determinants of Health   Financial Resource Strain: Not on file  Food Insecurity: Not on file  Transportation Needs: Not on file  Physical Activity: Not on file  Stress: Not on file  Social Connections: Not on file     Family History:  The patient's family history includes Alzheimer's disease in her  mother; Dementia in her mother; Diabetes in her mother and sister; Heart attack in her father; Heart attack (age of onset: 33) in her brother; Schizophrenia in her sister; Tremor in her sister.   Review of Systems:   As noted in HPI.  All other systems reviewed and are otherwise negative except as noted above.   Physical Exam:    VS:  BP 130/72   Pulse 78   Ht 5\' 4"  (1.626 m)   Wt 184 lb 9.6 oz (83.7 kg)   SpO2 97%   BMI 31.69 kg/m    GENERAL:  Well appearing overweight WF in NAD HEENT:  PERRL, EOMI, sclera are clear. Oropharynx is clear. NECK:  No jugular venous distention, carotid upstroke brisk and symmetric, right carotid bruit,  left subclavian  bruit, no thyromegaly or adenopathy LUNGS:  Clear to auscultation bilaterally CHEST:  Unremarkable HEART:  RRR,  PMI not displaced or sustained,S1 and S2 within normal limits, no S3, no S4: no clicks, no rubs, no murmurs ABD:  Soft, nontender. BS +, no masses or bruits. No hepatomegaly, no splenomegaly EXT:  Poor pedal pulses, absent left radial pulse.  no edema, no cyanosis no clubbing SKIN:  Warm and dry.  No rashes NEURO:  Alert and oriented x 3. Cranial nerves II through XII intact. PSYCH:  Cognitively intact    Wt Readings from Last 3 Encounters:  02/12/21 184 lb 9.6 oz (83.7 kg)  01/30/21 185 lb 3.2 oz (84 kg)  12/30/20 176 lb (79.8 kg)  Studies/Labs Reviewed:   Recent Labs: 11/08/2020: Hemoglobin 13.1; Platelets 326 12/30/2020: ALT 11; BUN 12; Creatinine, Ser 1.00; Potassium 4.6; Sodium 136   Lipid Panel    Component Value Date/Time   CHOL 115 09/17/2020 0946   TRIG 156 (H) 09/17/2020 0946   HDL 36 (L) 09/17/2020 0946   CHOLHDL 3.2 09/17/2020 0946   CHOLHDL 4.6 07/02/2016 0823   VLDL 25 07/02/2016 0823   LDLCALC 52 09/17/2020 0946    Additional studies/ records that were reviewed today include:   Echocardiogram: 02/09/2017 Study Conclusions  - Left ventricle: The cavity size was mildly dilated. Wall    thickness was increased in a pattern of mild LVH. Systolic   function was severely reduced. The estimated ejection fraction   was in the range of 20% to 25%. Diffuse hypokinesis. Doppler   parameters are consistent with abnormal left ventricular   relaxation (grade 1 diastolic dysfunction). - Aortic valve: Valve area (Vmax): 1.4 cm^2. - Aortic root: The aortic root was mildly dilated. - Ascending aorta: The ascending aorta was mildly dilated. - Mitral valve: Calcified annulus. There was moderate   regurgitation. - Left atrium: The atrium was moderately dilated. - Right ventricle: Systolic function was moderately reduced. - Pulmonary arteries: Systolic pressure was mildly increased. - Pericardium, extracardiac: A trivial pericardial effusion was   identified.  Impressions:  - No subcostal views; severe global reduction in LV systolic   function; grade 1 diastolic dysfunction; mildly dilated aortic   root and ascending aorta; moderate MR; moderaet LAE; moderately   reduced RV function; mild TR; mildly elevated pulmonary pressure.  Procedures   Right/Left Heart Cath and Coronary Angiography  Conclusion     Ost 1st Mrg to 1st Mrg lesion, 75 %stenosed.  2nd Mrg lesion, 90 %stenosed.  Ost RCA to Mid RCA lesion, 100 %stenosed.  There is severe left ventricular systolic dysfunction.  LV end diastolic pressure is normal.  The left ventricular ejection fraction is 25-35% by visual estimate.  Hemodynamic findings consistent with mild pulmonary hypertension.  LV end diastolic pressure is normal.   1. Severe 2 vessel obstructive CAD    - 75% proximal OM1    - 90% small OM2    - 100% proximal RCA. Left to right collaterals.  2. Severe LV dysfunction 3. Mild pulmonary HTN with normal LV filling pressures.  4. Cardiac index 2.41 L/min/BSA   Plan: Medical management to try and optimize CHF therapy. Patient appears to be adequately diuresed at this time. Based on these  results her cardiomyopathy is ischemic. I would treat her CAD medically. If cardiac cath is needed in the future would consider alternative access given difficulty from the right radial approach. Her rhythm during procedure is a multifocal atrial rhythm/tachycardia.     Echo 07/08/17: Study Conclusions  - Left ventricle: The cavity size was normal. There was moderate   concentric hypertrophy. Systolic function was normal. The   estimated ejection fraction was in the range of 50% to 55%.   Severe hypokinesis of the basal-midinferior myocardium;   consistent with infarction in the distribution of the right   coronary artery. Doppler parameters are consistent with abnormal   left ventricular relaxation (grade 1 diastolic dysfunction). - Mitral valve: Calcified annulus.  Impressions:  - Compared to April 2018 there is marked improvement in contraction   of all LV wall segemnts except for the inferior wall, which   remains severely hypokinetic. There is also marked reduction in   the severity of mitral  insufficiency.   Assessment:    1. Chronic systolic CHF (congestive heart failure) (HCC)   2. Coronary artery disease of native artery of native heart with stable angina pectoris (HCC)   3. PAF (paroxysmal atrial fibrillation) (HCC)   4. Pure hypercholesterolemia   5. PAD (peripheral artery disease) (HCC)   6. Essential hypertension      Plan:   In order of problems listed above:  1. Chronic Combined Systolic and Diastolic CHF/  Secondary to ischemic/tachycardia mediated Cardiomyopathy - EF 25-30% in 2018 with ischemic cardiomyopathy. Not a candidate for revascularization. With medical management EF improved to 50-55% by Echo.  - she is euvolemic at this time. Weight is stable  - continue BB, aldactone, and statin. ARB discontinued due to orthostatic dizziness.  2. Paroxysmal Atrial Fibrillation/ multifocal atrial tachycardia - This patients CHA2DS2-VASc Score and unadjusted  Ischemic Stroke Rate (% per year) is equal to 9.7 % stroke rate/year from a score of 6 (CHF, HTN, Female, Age, CVA (2)). She denies any evidence of active bleeding. Continue Coumadin for anticoagulation.   3. HTN- controlled.   4. COPD- - followed by Pulmonology.   5. PFO - no plans for closure   6. PAD s/p covered stenting of left iliac. Stenosis in distal aorta and right femoral not amenable to percutaneous therapy. Left subclavian stenosis. Claudication is stable. Followed by Dr Allyson Sabal. Repeat dopplers scheduled next month  7. CAD. 100% RCA. 75% OM. Chronic stable angina class 1-2.  No active angina. Rarely uses Ntg.   8. S/p right retinal artery occlusion. Plavix added to coumadin due to severe vascular disease. I feel benefit of Plavix/coumadin combination outweighs risk of bleeding at this time.  9. Old CVAs noted on MRI  10. Hypercholesterolemia. LDL 116 on maximally tolerated statin. Now on Praluent. LDL improved to 52.   11. Carotid arterial disease. No new Neurologic symptoms. Dopplers in January 2022 were unchanged.  12. 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 Reassured her about this. At most will follow up CT in one year.    I will follow up in 6 months   Signed, Karis Rilling [place], MD  02/12/2021 8:13 AM    [facility] [address], [address] [place], [place] [address] Phone: [phone]

## 2021-02-11 ENCOUNTER — Other Ambulatory Visit: Payer: Self-pay | Admitting: Family Medicine

## 2021-02-12 ENCOUNTER — Other Ambulatory Visit: Payer: Self-pay

## 2021-02-12 ENCOUNTER — Encounter: Payer: Self-pay | Admitting: Cardiology

## 2021-02-12 ENCOUNTER — Ambulatory Visit (INDEPENDENT_AMBULATORY_CARE_PROVIDER_SITE_OTHER): Payer: Medicare Other | Admitting: Cardiology

## 2021-02-12 VITALS — BP 130/72 | HR 78 | Ht 64.0 in | Wt 184.6 lb

## 2021-02-12 DIAGNOSIS — I25118 Atherosclerotic heart disease of native coronary artery with other forms of angina pectoris: Secondary | ICD-10-CM

## 2021-02-12 DIAGNOSIS — E7800 Pure hypercholesterolemia, unspecified: Secondary | ICD-10-CM | POA: Diagnosis not present

## 2021-02-12 DIAGNOSIS — I5022 Chronic systolic (congestive) heart failure: Secondary | ICD-10-CM

## 2021-02-12 DIAGNOSIS — I480 Paroxysmal atrial fibrillation: Secondary | ICD-10-CM

## 2021-02-12 DIAGNOSIS — I739 Peripheral vascular disease, unspecified: Secondary | ICD-10-CM

## 2021-02-12 DIAGNOSIS — I10 Essential (primary) hypertension: Secondary | ICD-10-CM

## 2021-02-12 LAB — ALPHA-1 ANTITRYPSIN PHENOTYPE: A-1 Antitrypsin, Ser: 143 mg/dL (ref 83–199)

## 2021-02-14 ENCOUNTER — Encounter: Payer: Self-pay | Admitting: Family Medicine

## 2021-02-17 ENCOUNTER — Telehealth: Payer: Self-pay

## 2021-02-17 MED ORDER — OXYBUTYNIN CHLORIDE ER 10 MG PO TB24: 10.0000 mg | ORAL_TABLET | Freq: Every day | ORAL | 2 refills | Status: DC

## 2021-02-17 MED ORDER — MONTELUKAST SODIUM 10 MG PO TABS: ORAL_TABLET | ORAL | 2 refills | Status: DC

## 2021-02-17 NOTE — Telephone Encounter (Signed)
Medication was sent

## 2021-02-17 NOTE — Telephone Encounter (Signed)
Patient's medication has been sent to the pharmacy.

## 2021-02-18 ENCOUNTER — Other Ambulatory Visit: Payer: Self-pay

## 2021-02-18 ENCOUNTER — Encounter (HOSPITAL_COMMUNITY): Payer: Self-pay | Admitting: Psychiatry

## 2021-02-18 ENCOUNTER — Telehealth (INDEPENDENT_AMBULATORY_CARE_PROVIDER_SITE_OTHER): Payer: Medicare Other | Admitting: Psychiatry

## 2021-02-18 DIAGNOSIS — F331 Major depressive disorder, recurrent, moderate: Secondary | ICD-10-CM

## 2021-02-18 DIAGNOSIS — F411 Generalized anxiety disorder: Secondary | ICD-10-CM | POA: Diagnosis not present

## 2021-02-18 MED ORDER — TRAZODONE HCL 100 MG PO TABS
ORAL_TABLET | ORAL | 2 refills | Status: DC
Start: 2021-02-18 — End: 2021-08-06

## 2021-02-18 MED ORDER — DESVENLAFAXINE SUCCINATE ER 50 MG PO TB24
50.0000 mg | ORAL_TABLET | Freq: Every day | ORAL | 2 refills | Status: DC
Start: 2021-02-18 — End: 2021-08-18

## 2021-02-18 NOTE — Progress Notes (Signed)
Alpha 1 is MM and normal . Will not be calling iwht this normal result

## 2021-02-18 NOTE — Progress Notes (Signed)
Virtual Visit via Telephone Note  I connected with Laurie Horton on 02/18/21 at  8:40 AM EDT by telephone and verified that I am speaking with the correct person using two identifiers.  Location: Patient: Home Provider: Home Office   I discussed the limitations, risks, security and privacy concerns of performing an evaluation and management service by telephone and the availability of in person appointments. I also discussed with the patient that there may be a patient responsible charge related to this service. The patient expressed understanding and agreed to proceed.   History of Present Illness: Patient is evaluated by phone session.  She continues to have chronic symptoms of anxiety and lately she is concerned about her.  She has a cat for more than 10 years who is lately not doing well and she has to give the medication but cat does not like taking the medication.  She also endorsed her grandson is getting married this Sunday and not sure if her daughter will pick her up.  She is still issues with her older daughter as sometimes she does not talk to her.  Patient admitted sometimes emotional but denies any crying spells, feeling of hopelessness or any suicidal thoughts.  Recently she had a visit with her cardiologist and pulmonologist.  She had blood work and on February 28 her hemoglobin A1c was 6.4, BUN 12 and creatinine 1.  Now she is doing physical therapy and feeling better and started walking every day.  Her breathing is also improved from the past.  Sometimes she struggles with insomnia but does not feel trazodone helps sleeping all night.  She reported chronic financial concerns as she is on a limited income.  She lives by herself with a cat.  She denies any anger, mania, suicidal thoughts or any feeling of hopelessness.  She is no longer taking Valium which we discontinued as patient taking gabapentin, hydrocodone and muscle relaxant.  Her appetite is okay.  Energy level is fair.  Her  neighbors are very helpful.  She does drive and goes to grocery stores early in the morning to avoid crowd.  Past Psychiatric History: H/O depression and anxiety. No H/Oinpatient treatment, suicidal attempt, paranoia,mania andhallucination. Seeing psychiatrist in 2006 after a stroke. Tried Cymbalta, lexaproand Celexa in the past with limited response. Tried Ambien, Lunesta, lexapro, Lamictaland Xanax but developed allergies and side effects. Tried higherPristiq buthasside effects. Valium helped in past.  Recent Results (from the past 2160 hour(s))  POCT INR     Status: None   Collection Time: 11/29/20  8:12 AM  Result Value Ref Range   INR 2.3 2.0 - 3.0  SARS-CoV-2 Antibody(IgG)Spike,Semi-Quantitative     Status: None   Collection Time: 12/19/20 10:16 AM  Result Value Ref Range   SARS COV1 AB(IGG)SPIKE,SEMI QN <1.00 <1.00 index    Comment: . This test is intended to help identify individuals with antibodies to SARS-CoV-2 (COVID-19). The results of this semi-quantitative test should not be interpreted as an indication or degree of immunity or protection from reinfection.  . A test result that is 1.00 or more (Positive) means antibodies to SARS-CoV-2 were detected in the blood sample by the test. This could mean that the individual may have an immune response to a recent or prior  infection with SARS-CoV-2. Positive results may occur after COVID-19 vaccination, but the clinical significance of a positive antibody result for individuals that have received a COVID-19 vaccine is unknown, and the  performance of the test has not been  established in COVID-19 vaccinees. False positive results for the test may occur due to cross-reactivity from pre-existing antibodies or other possible causes. . A test result that is less than 1.00 (Negative) means that antibodies were not detected in the blood sample by the  test. This could mean that the individual has not  been previously  infected with SARS-CoV-2. The clinical significance of a negative antibody result for individuals that have received a COVID-19 vaccine is unknown. The performance of the test has not been established  in COVID-19 vaccinees. False negative results for the test may occur if the individual's antibodies have not reached a sufficient level for the test to be able to detect them. Antibodies can take up to two to three weeks (sometimes longer) to develop after someone is infected. How long antibodies to SARS-CoV-2 last after infection is not known. . This test should not be used to diagnose an active SARS-CoV-2 infection. If an active infection is suspected, direct molecular or antigen testing for SARS-CoV-2 is recommended. . Please review the "Fact Sheets" available for healthcare providers and patients using the following websites:  http://patient.[facility].com/[facility]-HCP http://patient.[facility].com/[facility]-Patients . Healthcare Providers:  For additional information please refer to: http://education.[facility].com/faq/FAQ219 (This link is being provided for informational/educational purposes only.) . This test has been authorized by the FDA under an Emergency Use  Authorization (EUA) for use by authorized laboratories. The FDA  authorized labeling is available on the [employer] website: www.[facility].com/Covid19.   Hemoglobin A1c     Status: Abnormal   Collection Time: 12/30/20 11:28 AM  Result Value Ref Range   Hgb A1c MFr Bld 6.4 (H) 4.8 - 5.6 %    Comment:          Prediabetes: 5.7 - 6.4          Diabetes: >6.4          Glycemic control for adults with diabetes: <7.0    Est. average glucose Bld gHb Est-mCnc 137 mg/dL  Comprehensive metabolic panel     Status: Abnormal   Collection Time: 12/30/20 11:28 AM  Result Value Ref Range   Glucose 105 (H) 65 - 99 mg/dL   BUN 12 8 - 27 mg/dL   Creatinine, Ser 1.00 0.57 - 1.00 mg/dL   eGFR 59  (L) >59 mL/min/1.73    Comment: **In accordance with recommendations from the NKF-ASN Task force,**   [facility] has updated its eGFR calculation to the 2021 CKD-EPI   creatinine equation that estimates kidney function without a race   variable.    BUN/Creatinine Ratio 12 12 - 28   Sodium 136 134 - 144 mmol/L   Potassium 4.6 3.5 - 5.2 mmol/L   Chloride 93 (L) 96 - 106 mmol/L   CO2 26 20 - 29 mmol/L   Calcium 10.3 8.7 - 10.3 mg/dL   Total Protein 7.1 6.0 - 8.5 g/dL   Albumin 4.9 (H) 3.7 - 4.7 g/dL   Globulin, Total 2.2 1.5 - 4.5 g/dL   Albumin/Globulin Ratio 2.2 1.2 - 2.2   Bilirubin Total 0.5 0.0 - 1.2 mg/dL   Alkaline Phosphatase 83 44 - 121 IU/L   AST 18 0 - 40 IU/L   ALT 11 0 - 32 IU/L  POCT INR     Status: None   Collection Time: 01/10/21  8:34 AM  Result Value Ref Range   INR 2.2 2.0 - 3.0  SARS CORONAVIRUS 2 (TAT 6-24 HRS) Nasopharyngeal Nasopharyngeal Swab     Status: None   Collection  Time: 01/27/21  8:17 AM   Specimen: Nasopharyngeal Swab  Result Value Ref Range   SARS Coronavirus 2 NEGATIVE NEGATIVE    Comment: (NOTE) SARS-CoV-2 target nucleic acids are NOT DETECTED.  The SARS-CoV-2 RNA is generally detectable in upper and lower respiratory specimens during the acute phase of infection. Negative results do not preclude SARS-CoV-2 infection, do not rule out co-infections with other pathogens, and should not be used as the sole basis for treatment or other patient management decisions. Negative results must be combined with clinical observations, patient history, and epidemiological information. The expected result is Negative.  Fact Sheet for Patients: SugarRoll.be  Fact Sheet for Healthcare Providers: https://www.woods-mathews.com/  This test is not yet approved or cleared by the [place] FDA and  has been authorized for detection and/or diagnosis of SARS-CoV-2 by FDA under an Emergency Use Authorization (EUA). This  EUA will remain  in effect (meaning this test can be used) for the duration of the COVID-19 declaration under Se ction 564(b)(1) of the Act, 21 U.S.C. section 360bbb-3(b)(1), unless the authorization is terminated or revoked sooner.  Performed at [facility], [place] [address], [place], [place] [address]   Pulmonary function test     Status: None (Preliminary result)   Collection Time: 01/30/21  8:51 AM  Result Value Ref Range   FVC-Pre 2.61 L   FVC-%Pred-Pre 92 %   FEV1-Pre 1.63 L   FEV1-%Pred-Pre 77 %   FEV6-Pre 2.55 L   FEV6-%Pred-Pre 95 %   Pre FEV1/FVC ratio 63 %   FEV1FVC-%Pred-Pre 83 %   Pre FEV6/FVC Ratio 98 %   FEV6FVC-%Pred-Pre 102 %   FEF 25-75 Pre 0.72 L/sec   FEF2575-%Pred-Pre 43 %   RV 1.85 L   RV % pred 80 %   TLC 4.59 L   TLC % pred 90 %   DLCO unc 9.45 ml/min/mmHg   DLCO unc % pred 49 %   DLCO cor 9.45 ml/min/mmHg   DLCO cor % pred 49 %   DL/VA 2.35 ml/min/mmHg/L   DL/VA % pred 57 %  Alpha-1 antitrypsin phenotype     Status: None   Collection Time: 01/30/21 11:16 AM  Result Value Ref Range   A-1 Antitrypsin, Ser 143 83 - 199 mg/dL   ALPHA-1-ANTITRYPSIN (AAT) PHENOTYPE SEE NOTE     Comment: THIS PATIENT'S ALPHA-1-ANTITRYPSIN PHENOTYPE IS PI*MM. Marland Kitchen 90% of normal individuals have the MM phenotype, with normal quantitative AAT levels. Many phenotypic patterns have been described, including deficiency states with F, S, Z, or other alleles. As a general estimation, compared to M allele of 100% of normal A-1-Antitrypsin protein, the S allele produces approximately 60% and the Z allele 20%. For example, an MS phenotype would have about 80% of normal A-1-Antitrypsin protein level, a 50% contribution from the M allele and 30% from the S allele. A ZZ phenotype would have about 20% of normal levels, a 10% contribution from each Z gene. The F allele has normal A-1-Antitrypsin levels, but the kinetics of elastase inhibition is not as efficient as an M allele  product; F alleles should be considered functionally mildly deficient. Other variants are identifiable by phenotypic analysis. These include CM, DP, EM, GM, IS, LM, M1M2, M3M3, MP, MT, XX, MY, and M1N. I, P, T and  null alleles are considered deleterious. C, D, E, G, L, M1, M2, M3, X and Y alleles are generally considered normal variants. The MZ-Pratt phenotype is a normal variant; care should be taken to  avoid confusion with the deficient MZ phenotype.      Psychiatric Specialty Exam: Physical Exam  Review of Systems  Weight 184 lb (83.5 kg).There is no height or weight on file to calculate BMI.  General Appearance: NA  Eye Contact:  NA  Speech:  Normal Rate  Volume:  Normal  Mood:  Anxious  Affect:  NA  Thought Process:  Descriptions of Associations: Intact  Orientation:  Full (Time, Place, and Person)  Thought Content:  Rumination  Suicidal Thoughts:  No  Homicidal Thoughts:  No  Memory:  Immediate;   Good Recent;   Fair Remote;   Fair  Judgement:  Intact  Insight:  Present  Psychomotor Activity:  NA  Concentration:  Concentration: Fair and Attention Span: Fair  Recall:  [employer] of Knowledge:  Good  Language:  Good  Akathisia:  No  Handed:  Right  AIMS (if indicated):     Assets:  Communication Skills Desire for Improvement Housing Resilience  ADL's:  Intact  Cognition:  WNL  Sleep:   fair     Assessment and Plan: Major depressive disorder, recurrent.  Generalized anxiety disorder.  Patient physically doing better and denies any cough but getting physical therapy to help her muscles which were weak when she was having repeated infections.  She does use oxygen.  Discussed current medication and recent blood work results.  Her hemoglobin A1c, BUN/creatinine were reviewed.  I recommend if she has sometime insomnia and then she can try over-the-counter low-dose melatonin.  Encouraged physical therapy and regular walking.  We have recommended therapy with  patient denied.  Continue trazodone 100 mg at bedtime and Pristiq 50 mg daily.  Recommended to call us back if she has any questions or any concerns.  Follow up in 3 months.  Follow Up Instructions:    I discussed the assessment and treatment plan with the patient. The patient was provided an opportunity to ask questions and all were answered. The patient agreed with the plan and demonstrated an understanding of the instructions.   The patient was advised to call back or seek an in-person evaluation if the symptoms worsen or if the condition fails to improve as anticipated.  I provided 19 minutes of non-face-to-face time during this encounter.   Kathlee Nations, MD

## 2021-02-26 ENCOUNTER — Ambulatory Visit (INDEPENDENT_AMBULATORY_CARE_PROVIDER_SITE_OTHER): Payer: Medicare Other

## 2021-02-26 ENCOUNTER — Other Ambulatory Visit: Payer: Self-pay

## 2021-02-26 DIAGNOSIS — I480 Paroxysmal atrial fibrillation: Secondary | ICD-10-CM

## 2021-02-26 DIAGNOSIS — Z7901 Long term (current) use of anticoagulants: Secondary | ICD-10-CM | POA: Diagnosis not present

## 2021-02-26 LAB — POCT INR: INR: 1.4 — AB (ref 2.0–3.0)

## 2021-02-26 NOTE — Patient Instructions (Signed)
Take 1.5 tablets tonight only and then Continue with 0.5 tablet daily, repeat INR in 4 weeks

## 2021-02-27 ENCOUNTER — Other Ambulatory Visit: Payer: Self-pay | Admitting: Cardiology

## 2021-03-06 ENCOUNTER — Other Ambulatory Visit: Payer: Self-pay

## 2021-03-06 ENCOUNTER — Ambulatory Visit (HOSPITAL_COMMUNITY)
Admission: RE | Admit: 2021-03-06 | Discharge: 2021-03-06 | Disposition: A | Discharge status: Home / Self Care | Payer: Medicare Other | Source: Ambulatory Visit | Attending: Cardiology | Admitting: Cardiology

## 2021-03-06 ENCOUNTER — Ambulatory Visit (HOSPITAL_BASED_OUTPATIENT_CLINIC_OR_DEPARTMENT_OTHER)
Admission: RE | Admit: 2021-03-06 | Discharge: 2021-03-06 | Disposition: A | Discharge status: Home / Self Care | Payer: Medicare Other | Source: Ambulatory Visit | Attending: Cardiovascular Disease | Admitting: Cardiovascular Disease

## 2021-03-06 DIAGNOSIS — Z95820 Peripheral vascular angioplasty status with implants and grafts: Secondary | ICD-10-CM

## 2021-03-06 DIAGNOSIS — Z95828 Presence of other vascular implants and grafts: Secondary | ICD-10-CM | POA: Insufficient documentation

## 2021-03-06 DIAGNOSIS — I739 Peripheral vascular disease, unspecified: Secondary | ICD-10-CM | POA: Diagnosis present

## 2021-03-10 ENCOUNTER — Other Ambulatory Visit: Payer: Self-pay | Admitting: Cardiology

## 2021-03-26 ENCOUNTER — Telehealth: Payer: Self-pay

## 2021-03-26 ENCOUNTER — Ambulatory Visit (INDEPENDENT_AMBULATORY_CARE_PROVIDER_SITE_OTHER): Payer: Medicare Other

## 2021-03-26 ENCOUNTER — Other Ambulatory Visit: Payer: Self-pay

## 2021-03-26 DIAGNOSIS — I480 Paroxysmal atrial fibrillation: Secondary | ICD-10-CM

## 2021-03-26 DIAGNOSIS — Z7901 Long term (current) use of anticoagulants: Secondary | ICD-10-CM

## 2021-03-26 LAB — POCT INR: INR: 1.7 — AB (ref 2.0–3.0)

## 2021-03-26 NOTE — Telephone Encounter (Signed)
I spoke to the patient and told her that the arm used should not reflect on the INR, but will try to use the other next time to cross reference the value.  She verbalized understanding.

## 2021-03-26 NOTE — Patient Instructions (Signed)
Take 2 tablets tonight only and then increase to 0.5 tablet daily, except 1 tablet on Wednesday repeat INR in 4 weeks; [phone]

## 2021-04-23 ENCOUNTER — Ambulatory Visit (INDEPENDENT_AMBULATORY_CARE_PROVIDER_SITE_OTHER): Payer: Medicare Other

## 2021-04-23 ENCOUNTER — Other Ambulatory Visit: Payer: Self-pay

## 2021-04-23 DIAGNOSIS — Z7901 Long term (current) use of anticoagulants: Secondary | ICD-10-CM | POA: Diagnosis not present

## 2021-04-23 DIAGNOSIS — I480 Paroxysmal atrial fibrillation: Secondary | ICD-10-CM | POA: Diagnosis not present

## 2021-04-23 LAB — POCT INR: INR: 2 (ref 2.0–3.0)

## 2021-04-23 NOTE — Patient Instructions (Signed)
Continue taking 0.5 tablet daily, except 1 tablet on Wednesday repeat INR in 6 weeks; [phone]

## 2021-05-12 ENCOUNTER — Other Ambulatory Visit: Payer: Self-pay | Admitting: Cardiology

## 2021-05-12 ENCOUNTER — Other Ambulatory Visit: Payer: Self-pay | Admitting: Internal Medicine

## 2021-05-12 DIAGNOSIS — J9611 Chronic respiratory failure with hypoxia: Secondary | ICD-10-CM

## 2021-05-20 ENCOUNTER — Telehealth (HOSPITAL_COMMUNITY): Payer: Medicare Other | Admitting: Psychiatry

## 2021-05-23 ENCOUNTER — Encounter (HOSPITAL_COMMUNITY): Payer: Self-pay

## 2021-05-23 ENCOUNTER — Telehealth (HOSPITAL_COMMUNITY): Payer: Medicare Other | Admitting: Psychiatry

## 2021-05-23 ENCOUNTER — Other Ambulatory Visit: Payer: Self-pay

## 2021-05-25 ENCOUNTER — Other Ambulatory Visit: Payer: Self-pay | Admitting: Family Medicine

## 2021-06-04 ENCOUNTER — Ambulatory Visit (INDEPENDENT_AMBULATORY_CARE_PROVIDER_SITE_OTHER): Payer: Medicare Other

## 2021-06-04 ENCOUNTER — Other Ambulatory Visit: Payer: Self-pay

## 2021-06-04 DIAGNOSIS — Z7901 Long term (current) use of anticoagulants: Secondary | ICD-10-CM | POA: Diagnosis not present

## 2021-06-04 DIAGNOSIS — I480 Paroxysmal atrial fibrillation: Secondary | ICD-10-CM | POA: Diagnosis not present

## 2021-06-04 LAB — POCT INR: INR: 1.7 — AB (ref 2.0–3.0)

## 2021-06-04 NOTE — Patient Instructions (Signed)
Take 2 tablets today only and then Continue taking 0.5 tablet daily, except 1 tablet on Wednesday repeat INR in 2 weeks; [phone]

## 2021-06-08 ENCOUNTER — Other Ambulatory Visit: Payer: Self-pay | Admitting: Cardiology

## 2021-06-18 ENCOUNTER — Telehealth: Payer: Self-pay | Admitting: Internal Medicine

## 2021-06-18 NOTE — Telephone Encounter (Signed)
   AECOPD  Biaxin '500mg'$  po bid x 5 days for aecopd  Please take prednisone 40 mg x1 day, then 30 mg x1 day, then 20 mg x1 day, then 10 mg x1 day, and then 5 mg x1 day and stop     Allergies  Allergen Reactions   Alprazolam Anaphylaxis and Other (See Comments)    REACTION: stops breathing   Bee Venom Anaphylaxis   Iodine Anaphylaxis, Swelling and Other (See Comments)    REACTION: swelling in throat   Pseudoephedrine Hcl Er Shortness Of Breath   Budesonide-Formoterol Fumarate Other (See Comments)    Blisters inside of mouth all over   Crestor [Rosuvastatin Calcium] Other (See Comments)    Unable to walk   Esomeprazole Magnesium Other (See Comments)    REACTION: "bouncing off walls"   Flonase [Fluticasone Propionate] Other (See Comments)    NOSE BLEED   Lamictal [Lamotrigine] Rash    Patient got rash, labored breathing, and diarrhea   Loratadine Other (See Comments)    claritin D causes shaking   Lotrimin [Clotrimazole] Other (See Comments)    Mouth blisters   Lunesta [Eszopiclone] Other (See Comments)    REACTION: "slept for a week"   Oxcarbazepine Other (See Comments)    Causes deep sleep and dizziness   Statins Other (See Comments)    Can't walk, legs won't work    Zolpidem Tartrate Other (See Comments)    REACTION: "slept for a week"   Betadine [Povidone Iodine] Other (See Comments)    Breathing problems   Bevespi Aerosphere [Glycopyrrolate-Formoterol] Other (See Comments)    Pt believes this caused mouth sores and thrush    Clarithromycin Other (See Comments)    All "mycins", Puts into "a" fib, Will take if has to for severe sinus infection   Effexor [Venlafaxine] Nausea And Vomiting and Other (See Comments)    cramps   Lexapro [Escitalopram Oxalate] Other (See Comments)    hallucinations   Aciphex [Rabeprazole Sodium] Rash   Alendronate Sodium Other (See Comments)    "caused stomach problems for 3 days"   Avelox [Moxifloxacin Hcl In Nacl] Other (See Comments)     Stomach cramps.    Bextra [Valdecoxib] Rash   Ceclor [Cefaclor] Rash   Cephalexin Rash and Other (See Comments)    Pt states that she is possibly allergic to this - had a reaction to Cefaclor in the past and she does not want to these class drugs. Added per patient request.   Covera-Hs [Verapamil Hcl] Palpitations   Dicyclomine Hcl Rash   Other Other (See Comments)    Glue from ekg/heart monitor leads --rash, Any MYCINS   Tessalon Perles Rash

## 2021-06-18 NOTE — Telephone Encounter (Signed)
ATC x1.  Line was busy.

## 2021-06-18 NOTE — Telephone Encounter (Signed)
Pt stated that she believes she has pneumonia stated that her current symptoms are; breathing difficulty; has to be on her O2 more than normal, white foamy mucus, denies a fever, coughing a lot and she stated that she is also sometimes coughing up green mucus, pt states that she is wheezing as well. Pt stated that she did a Covid-19 home test and it was negative.   Pharmacy; [id number] [id number]  Pls regard; [phone]

## 2021-06-19 NOTE — Telephone Encounter (Signed)
ATC patient but received a busy tone each time. Will attempt to call back later.

## 2021-06-20 ENCOUNTER — Other Ambulatory Visit: Payer: Self-pay

## 2021-06-20 ENCOUNTER — Telehealth: Payer: Self-pay | Admitting: Internal Medicine

## 2021-06-20 ENCOUNTER — Ambulatory Visit (INDEPENDENT_AMBULATORY_CARE_PROVIDER_SITE_OTHER): Payer: Medicare Other

## 2021-06-20 DIAGNOSIS — Z7901 Long term (current) use of anticoagulants: Secondary | ICD-10-CM

## 2021-06-20 DIAGNOSIS — I480 Paroxysmal atrial fibrillation: Secondary | ICD-10-CM | POA: Diagnosis not present

## 2021-06-20 LAB — POCT INR: INR: 1.9 — AB (ref 2.0–3.0)

## 2021-06-20 NOTE — Telephone Encounter (Signed)
ATC x4, busy signal.  Attempted to call Laurie Horton at her home # and mobile number, there was no option to leave a message on her home #.  I left a detailed message on her mobile # (DPR).  Advised that her mother's phone has a busy signal when we try to call her and to have her call our office.  Will await return call.

## 2021-06-20 NOTE — Telephone Encounter (Signed)
ATC x3, line still busy.

## 2021-06-20 NOTE — Telephone Encounter (Signed)
ATC x1, reached busy signal.

## 2021-06-20 NOTE — Patient Instructions (Signed)
Take 1 tablet today only and then Continue taking 0.5 tablet daily, except 1 tablet on Wednesday repeat INR in 1 week; [phone]

## 2021-06-20 NOTE — Telephone Encounter (Signed)
Entered in error

## 2021-06-21 NOTE — Telephone Encounter (Signed)
MR I went to go and send the Biaxin to the pharmacy but this patient has an allergy to that medication and it states that this medication puts her into AFIB.  It says that she will take it IF it is for a severe sinus infection.  Do you still want to prescribe that?  Thanks

## 2021-06-23 MED ORDER — CLARITHROMYCIN 500 MG PO TABS: 500.0000 mg | ORAL_TABLET | Freq: Two times a day (BID) | ORAL | 0 refills | Status: DC

## 2021-06-23 MED ORDER — PREDNISONE 10 MG PO TABS: ORAL_TABLET | ORAL | 0 refills | Status: DC

## 2021-06-23 NOTE — Telephone Encounter (Signed)
Tried calling again at the number provided and line still busy Called daughter and she said to call pt on her cell [phone]  Spoke with the pt, notified of response per MR  I have sent in the pred and abx  She will call for appt if not improving or seek emergent care

## 2021-06-23 NOTE — Telephone Encounter (Signed)
I chose biaxin because patient previsouly wanted that. Please check with her. Her allergies are too numoerous. If she is ok with biaxin please send it   Allergies  Allergen Reactions   Alprazolam Anaphylaxis and Other (See Comments)    REACTION: stops breathing   Bee Venom Anaphylaxis   Iodine Anaphylaxis, Swelling and Other (See Comments)    REACTION: swelling in throat   Pseudoephedrine Hcl Er Shortness Of Breath   Budesonide-Formoterol Fumarate Other (See Comments)    Blisters inside of mouth all over   Crestor [Rosuvastatin Calcium] Other (See Comments)    Unable to walk   Esomeprazole Magnesium Other (See Comments)    REACTION: "bouncing off walls"   Flonase [Fluticasone Propionate] Other (See Comments)    NOSE BLEED   Lamictal [Lamotrigine] Rash    Patient got rash, labored breathing, and diarrhea   Loratadine Other (See Comments)    claritin D causes shaking   Lotrimin [Clotrimazole] Other (See Comments)    Mouth blisters   Lunesta [Eszopiclone] Other (See Comments)    REACTION: "slept for a week"   Oxcarbazepine Other (See Comments)    Causes deep sleep and dizziness   Statins Other (See Comments)    Can't walk, legs won't work    Zolpidem Tartrate Other (See Comments)    REACTION: "slept for a week"   Betadine [Povidone Iodine] Other (See Comments)    Breathing problems   Bevespi Aerosphere [Glycopyrrolate-Formoterol] Other (See Comments)    Pt believes this caused mouth sores and thrush    Clarithromycin Other (See Comments)    All "mycins", Puts into "a" fib, Will take if has to for severe sinus infection   Effexor [Venlafaxine] Nausea And Vomiting and Other (See Comments)    cramps   Lexapro [Escitalopram Oxalate] Other (See Comments)    hallucinations   Aciphex [Rabeprazole Sodium] Rash   Alendronate Sodium Other (See Comments)    "caused stomach problems for 3 days"   Avelox [Moxifloxacin Hcl In Nacl] Other (See Comments)    Stomach cramps.    Bextra  [Valdecoxib] Rash   Ceclor [Cefaclor] Rash   Cephalexin Rash and Other (See Comments)    Pt states that she is possibly allergic to this - had a reaction to Cefaclor in the past and she does not want to these class drugs. Added per patient request.   Covera-Hs [Verapamil Hcl] Palpitations   Dicyclomine Hcl Rash   Other Other (See Comments)    Glue from ekg/heart monitor leads --rash, Any MYCINS   Tessalon Perles Rash

## 2021-06-25 ENCOUNTER — Telehealth: Payer: Self-pay

## 2021-06-25 LAB — HM DIABETES EYE EXAM

## 2021-06-25 NOTE — Telephone Encounter (Signed)
Ok to keep INR appt on Friday and dosage can be adjusted at that time as needed

## 2021-06-25 NOTE — Telephone Encounter (Signed)
With so many allergies it is kind of challenging to pick an antibiotic. If she is bbetter from last time then just watch without antibiotic   Allergies  Allergen Reactions   Alprazolam Anaphylaxis and Other (See Comments)    REACTION: stops breathing   Bee Venom Anaphylaxis   Iodine Anaphylaxis, Swelling and Other (See Comments)    REACTION: swelling in throat   Pseudoephedrine Hcl Er Shortness Of Breath   Budesonide-Formoterol Fumarate Other (See Comments)    Blisters inside of mouth all over   Crestor [Rosuvastatin Calcium] Other (See Comments)    Unable to walk   Esomeprazole Magnesium Other (See Comments)    REACTION: "bouncing off walls"   Flonase [Fluticasone Propionate] Other (See Comments)    NOSE BLEED   Lamictal [Lamotrigine] Rash    Patient got rash, labored breathing, and diarrhea   Loratadine Other (See Comments)    claritin D causes shaking   Lotrimin [Clotrimazole] Other (See Comments)    Mouth blisters   Lunesta [Eszopiclone] Other (See Comments)    REACTION: "slept for a week"   Oxcarbazepine Other (See Comments)    Causes deep sleep and dizziness   Statins Other (See Comments)    Can't walk, legs won't work    Zolpidem Tartrate Other (See Comments)    REACTION: "slept for a week"   Betadine [Povidone Iodine] Other (See Comments)    Breathing problems   Bevespi Aerosphere [Glycopyrrolate-Formoterol] Other (See Comments)    Pt believes this caused mouth sores and thrush    Clarithromycin Other (See Comments)    All "mycins", Puts into "a" fib, Will take if has to for severe sinus infection   Effexor [Venlafaxine] Nausea And Vomiting and Other (See Comments)    cramps   Lexapro [Escitalopram Oxalate] Other (See Comments)    hallucinations   Aciphex [Rabeprazole Sodium] Rash   Alendronate Sodium Other (See Comments)    "caused stomach problems for 3 days"   Avelox [Moxifloxacin Hcl In Nacl] Other (See Comments)    Stomach cramps.    Bextra [Valdecoxib]  Rash   Ceclor [Cefaclor] Rash   Cephalexin Rash and Other (See Comments)    Pt states that she is possibly allergic to this - had a reaction to Cefaclor in the past and she does not want to these class drugs. Added per patient request.   Covera-Hs [Verapamil Hcl] Palpitations   Dicyclomine Hcl Rash   Other Other (See Comments)    Glue from ekg/heart monitor leads --rash, Any MYCINS   Tessalon Perles Rash

## 2021-06-25 NOTE — Telephone Encounter (Signed)
Started prednisone '10mg'$  taper Tuesday ad clarithromycin '500mg'$  bid she called in and left a msg and needs a call back to let her know when she needs to test inr again because it might need to be sooner since starting new med. I will route to pharmd pool for advisement on when to retest

## 2021-06-25 NOTE — Telephone Encounter (Signed)
Called and spoke w/pt and stated that they are fine to stay the course with coumadin and keep their regularly scheduled appt and the pt voiced understanding

## 2021-06-26 LAB — HM MAMMOGRAPHY

## 2021-06-26 NOTE — Telephone Encounter (Signed)
I called and spoke with patient regarding message. Patient has the prednisone and clindamycin and is ok taking these. She stated her phlegm is starting to turn clear and that is a good sign for her I instructed patient to call the office if not feeling better. Patient verbalized understanding, nothing further needed.

## 2021-06-26 NOTE — Telephone Encounter (Signed)
Sick message sent to Mychart messages:  Brand Males, MD routed this conversation to [facility] Luciana Axe, Belva Crome, MD     6:45 PM Note With so many allergies it is kind of challenging to pick an antibiotic. If she is bbetter from last time then just watch without antibiotic          Allergies  Allergen Reactions   Alprazolam Anaphylaxis and Other (See Comments)      REACTION: stops breathing   Bee Venom Anaphylaxis   Iodine Anaphylaxis, Swelling and Other (See Comments)      REACTION: swelling in throat   Pseudoephedrine Hcl Er Shortness Of Breath   Budesonide-Formoterol Fumarate Other (See Comments)      Blisters inside of mouth all over   Crestor [Rosuvastatin Calcium] Other (See Comments)      Unable to walk   Esomeprazole Magnesium Other (See Comments)      REACTION: "bouncing off walls"   Flonase [Fluticasone Propionate] Other (See Comments)      NOSE BLEED   Lamictal [Lamotrigine] Rash      Patient got rash, labored breathing, and diarrhea   Loratadine Other (See Comments)      claritin D causes shaking   Lotrimin [Clotrimazole] Other (See Comments)      Mouth blisters   Lunesta [Eszopiclone] Other (See Comments)      REACTION: "slept for a week"   Oxcarbazepine Other (See Comments)      Causes deep sleep and dizziness   Statins Other (See Comments)      Can't walk, legs won't work    Zolpidem Tartrate Other (See Comments)      REACTION: "slept for a week"   Betadine [Povidone Iodine] Other (See Comments)      Breathing problems   Bevespi Aerosphere [Glycopyrrolate-Formoterol] Other (See Comments)      Pt believes this caused mouth sores and thrush    Clarithromycin Other (See Comments)      All "mycins", Puts into "a" fib, Will take if has to for severe sinus infection   Effexor [Venlafaxine] Nausea And Vomiting and Other (See Comments)      cramps   Lexapro [Escitalopram Oxalate] Other (See Comments)      hallucinations   Aciphex  [Rabeprazole Sodium] Rash   Alendronate Sodium Other (See Comments)      "caused stomach problems for 3 days"   Avelox [Moxifloxacin Hcl In Nacl] Other (See Comments)      Stomach cramps.    Bextra [Valdecoxib] Rash   Ceclor [Cefaclor] Rash   Cephalexin Rash and Other (See Comments)      Pt states that she is possibly allergic to this - had a reaction to Cefaclor in the past and she does not want to these class drugs. Added per patient request.   Covera-Hs [Verapamil Hcl] Palpitations   Dicyclomine Hcl Rash   Other Other (See Comments)      Glue from ekg/heart monitor leads --rash, Any MYCINS   Tessalon Perles Rash          June 23, 2021      12:11 PM Brand Males, MD routed this conversation to [facility] Luciana Axe, Belva Crome, MD     12:11 PM Note I chose biaxin because patient previsouly wanted that. Please check with her. Her allergies are too numoerous. If she is ok with biaxin please send it          Allergies  Allergen Reactions  Alprazolam Anaphylaxis and Other (See Comments)      REACTION: stops breathing   Bee Venom Anaphylaxis   Iodine Anaphylaxis, Swelling and Other (See Comments)      REACTION: swelling in throat   Pseudoephedrine Hcl Er Shortness Of Breath   Budesonide-Formoterol Fumarate Other (See Comments)      Blisters inside of mouth all over   Crestor [Rosuvastatin Calcium] Other (See Comments)      Unable to walk   Esomeprazole Magnesium Other (See Comments)      REACTION: "bouncing off walls"   Flonase [Fluticasone Propionate] Other (See Comments)      NOSE BLEED   Lamictal [Lamotrigine] Rash      Patient got rash, labored breathing, and diarrhea   Loratadine Other (See Comments)      claritin D causes shaking   Lotrimin [Clotrimazole] Other (See Comments)      Mouth blisters   Lunesta [Eszopiclone] Other (See Comments)      REACTION: "slept for a week"   Oxcarbazepine Other (See Comments)      Causes deep sleep and  dizziness   Statins Other (See Comments)      Can't walk, legs won't work    Zolpidem Tartrate Other (See Comments)      REACTION: "slept for a week"   Betadine [Povidone Iodine] Other (See Comments)      Breathing problems   Bevespi Aerosphere [Glycopyrrolate-Formoterol] Other (See Comments)      Pt believes this caused mouth sores and thrush    Clarithromycin Other (See Comments)      All "mycins", Puts into "a" fib, Will take if has to for severe sinus infection   Effexor [Venlafaxine] Nausea And Vomiting and Other (See Comments)      cramps   Lexapro [Escitalopram Oxalate] Other (See Comments)      hallucinations   Aciphex [Rabeprazole Sodium] Rash   Alendronate Sodium Other (See Comments)      "caused stomach problems for 3 days"   Avelox [Moxifloxacin Hcl In Nacl] Other (See Comments)      Stomach cramps.    Bextra [Valdecoxib] Rash   Ceclor [Cefaclor] Rash   Cephalexin Rash and Other (See Comments)      Pt states that she is possibly allergic to this - had a reaction to Cefaclor in the past and she does not want to these class drugs. Added per patient request.   Covera-Hs [Verapamil Hcl] Palpitations   Dicyclomine Hcl Rash   Other Other (See Comments)      Glue from ekg/heart monitor leads --rash, Any MYCINS   Tessalon Perles Rash          Markeitha, Waechter to P [facility] Pool (supporting Brand Males, MD)     4:45 AM Nira Conn,  my phone is not working so you can call this number:  [phone] cell  Rolan Lipa

## 2021-06-28 ENCOUNTER — Other Ambulatory Visit: Payer: Self-pay | Admitting: Cardiology

## 2021-06-30 ENCOUNTER — Ambulatory Visit (INDEPENDENT_AMBULATORY_CARE_PROVIDER_SITE_OTHER): Payer: Medicare Other

## 2021-06-30 ENCOUNTER — Other Ambulatory Visit: Payer: Self-pay

## 2021-06-30 DIAGNOSIS — I480 Paroxysmal atrial fibrillation: Secondary | ICD-10-CM

## 2021-06-30 DIAGNOSIS — Z7901 Long term (current) use of anticoagulants: Secondary | ICD-10-CM | POA: Diagnosis not present

## 2021-06-30 LAB — POCT INR: INR: 3.3 — AB (ref 2.0–3.0)

## 2021-06-30 NOTE — Patient Instructions (Signed)
Hold tonight only and then Continue taking 0.5 tablet daily, except 1 tablet on Wednesday repeat INR in 3 weeks; [id number]

## 2021-07-02 ENCOUNTER — Ambulatory Visit (INDEPENDENT_AMBULATORY_CARE_PROVIDER_SITE_OTHER): Payer: Medicare Other | Admitting: Family Medicine

## 2021-07-02 ENCOUNTER — Other Ambulatory Visit: Payer: Self-pay

## 2021-07-02 ENCOUNTER — Telehealth: Payer: Self-pay | Admitting: Cardiology

## 2021-07-02 VITALS — BP 124/68 | HR 75 | Temp 98.0°F | Resp 16 | Ht 64.0 in | Wt 182.8 lb

## 2021-07-02 DIAGNOSIS — R32 Unspecified urinary incontinence: Secondary | ICD-10-CM

## 2021-07-02 DIAGNOSIS — I10 Essential (primary) hypertension: Secondary | ICD-10-CM | POA: Diagnosis not present

## 2021-07-02 DIAGNOSIS — J309 Allergic rhinitis, unspecified: Secondary | ICD-10-CM

## 2021-07-02 DIAGNOSIS — R519 Headache, unspecified: Secondary | ICD-10-CM | POA: Diagnosis not present

## 2021-07-02 DIAGNOSIS — I5022 Chronic systolic (congestive) heart failure: Secondary | ICD-10-CM

## 2021-07-02 DIAGNOSIS — I700 Atherosclerosis of aorta: Secondary | ICD-10-CM

## 2021-07-02 DIAGNOSIS — E7800 Pure hypercholesterolemia, unspecified: Secondary | ICD-10-CM

## 2021-07-02 DIAGNOSIS — R42 Dizziness and giddiness: Secondary | ICD-10-CM | POA: Diagnosis not present

## 2021-07-02 DIAGNOSIS — H9192 Unspecified hearing loss, left ear: Secondary | ICD-10-CM

## 2021-07-02 DIAGNOSIS — H9202 Otalgia, left ear: Secondary | ICD-10-CM

## 2021-07-02 DIAGNOSIS — R7303 Prediabetes: Secondary | ICD-10-CM | POA: Diagnosis not present

## 2021-07-02 LAB — COMPREHENSIVE METABOLIC PANEL
ALT: 13 U/L (ref 0–35)
AST: 16 U/L (ref 0–37)
Albumin: 4.4 g/dL (ref 3.5–5.2)
Alkaline Phosphatase: 67 U/L (ref 39–117)
BUN: 13 mg/dL (ref 6–23)
CO2: 32 mEq/L (ref 19–32)
Calcium: 10.3 mg/dL (ref 8.4–10.5)
Chloride: 91 mEq/L — ABNORMAL LOW (ref 96–112)
Creatinine, Ser: 0.99 mg/dL (ref 0.40–1.20)
GFR: 55.76 mL/min — ABNORMAL LOW (ref >60.00)
Glucose, Bld: 105 mg/dL — ABNORMAL HIGH (ref 70–99)
Potassium: 4 mEq/L (ref 3.5–5.1)
Sodium: 132 mEq/L — ABNORMAL LOW (ref 135–145)
Total Bilirubin: 0.6 mg/dL (ref 0.2–1.2)
Total Protein: 6.7 g/dL (ref 6.0–8.3)

## 2021-07-02 LAB — HEMOGLOBIN A1C: Hgb A1c MFr Bld: 6.4 % (ref 4.6–6.5)

## 2021-07-02 LAB — CBC
HCT: 42.2 % (ref 36.0–46.0)
Hemoglobin: 14 g/dL (ref 12.0–15.0)
MCHC: 33.1 g/dL (ref 30.0–36.0)
MCV: 91.5 fl (ref 78.0–100.0)
Platelets: 230 10*3/uL (ref 150.0–400.0)
RBC: 4.61 Mil/uL (ref 3.87–5.11)
RDW: 13.8 % (ref 11.5–15.5)
WBC: 8.4 10*3/uL (ref 4.0–10.5)

## 2021-07-02 LAB — LIPID PANEL
Cholesterol: 132 mg/dL (ref 0–200)
HDL: 41.8 mg/dL (ref >39.00)
NonHDL: 90.24
Total CHOL/HDL Ratio: 3
Triglycerides: 220 mg/dL — ABNORMAL HIGH (ref 0.0–149.0)
VLDL: 44 mg/dL — ABNORMAL HIGH (ref 0.0–40.0)

## 2021-07-02 LAB — SEDIMENTATION RATE: Sed Rate: 22 mm/hr (ref 0–30)

## 2021-07-02 LAB — LDL CHOLESTEROL, DIRECT: Direct LDL: 67 mg/dL

## 2021-07-02 MED ORDER — OXYBUTYNIN CHLORIDE ER 10 MG PO TB24: 10.0000 mg | ORAL_TABLET | Freq: Every day | ORAL | 2 refills | Status: DC

## 2021-07-02 MED ORDER — MONTELUKAST SODIUM 10 MG PO TABS: ORAL_TABLET | ORAL | 2 refills | Status: DC

## 2021-07-02 NOTE — Progress Notes (Signed)
Subjective:  Patient ID: Laurie Horton, female    DOB: Mar 02, 1946  Age: 75 y.o. MRN: [id number]  CC:  Chief Complaint  Patient presents with   Referral    Pt due for repeat colonoscopy, asking for this to be ordered today    Allergies    Pt in need of refill montelukast     HPI Laurie Horton presents for   Prediabetes: No unexplained blurry vision - had surgery by Dr. Herbert Deaner in July. "Black cataract". No increased thirst.  Concern per Dr. Herbert Deaner on note 06/25/21 for temporal arteritis - pain at left temple at times to press.  No headache. Some blurry vision at times - floaters, but not dark vision. No jaw pain.  Lab Results  Component Value Date   HGBA1C 6.4 07/02/2021   Wt Readings from Last 3 Encounters:  07/02/21 182 lb 12.8 oz (82.9 kg)  02/12/21 184 lb 9.6 oz (83.7 kg)  01/30/21 185 lb 3.2 oz (84 kg)   Overactive bladder, urinary incontinence  Has been followed by urology - Dr. Diona Fanti.  Wears incontinence pads.  Oxybutynin once per day. No new side effects, but some dizziness.   Allergic rhinitis: Some rhinorrhea - more with change in weather.  Taking montelukast daily.  Unable to take claritin - ? Thrush Has nasal perforation. Unable to use nasal spray.  No ENT eval in 3 years.  Has had nosebleeds with nasal sprays.   Dizziness: Past month.  No syncope. No room spinning. near syncope feeling.  Wearing oxygen with activity. Not feeling dyspneic with dizziness. Sproradic - few episodes past month.  No new heart palpitations. Has taken NTG twice this month for chest tightness. Some dizziness with chest tightness. No current chest pain at this time. Called cardiology for appointment. Not until December. BP 110/67 when dizzy.  No blood in stool or dark stools.  Eating and drinking ok.   COPD Pulmonary, Dr. [name].  COPD with chronic hypoxemic respiratory failure and chronic systolic CHF.  Treated with Atrovent 4 times daily, nighttime exertional oxygen  supplementation.  Pneumonia in January 2022, December 2021.  Pulm started on Breztri in March- not taking as Breztri as blisters in mouth. Back on atrovent up to 4 times per day.  puffs twice daily in March. Treated with Biaxin and prednisone for possible repeat PNA earlier this month. Getting better, still some cough, fatigue but clearing. No fever.   Cardiac History of ischemic cardiomyopathy, CAD, CHF, atrial fibrillation, peripheral vascular disease, subclavian artery stenosis.  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  Treated by cardiology, Dr. Martinique, Dr. Gwenlyn Found.  She is on Coumadin for anticoagulation.  INR monitored by cardiology.  Status post stenting of left iliac for PAD. Praluent for hyperlipidemia, Plavix with Coumadin due to severe vascular disease. Last INR 3.3. 2 days ago.  up from 1.9. prior prednisone, abx use. Next appt for INR 9/19.   Chronic pain, followed by pain management, psychiatry for depression.   Left ear soreness - past few weeks. No drainage/discharge. Some difficulty hearing out of left ear past few weeks.   Rash on left neck Noted after eye surgery on July 26th.  No pain, itching.  No treatments. No changes.   Plan to review HM next visit (bone density and cologuard).    History  Patient Active Problem List   Diagnosis Date Noted   Leg pain, bilateral 08/06/2020   CRAO (central retinal artery occlusion), right 11/23/2019   Pain  in right shoulder 11/08/2019   Multiple drug allergies 09/15/2019   Pain in left shoulder 06/21/2019   Subclavian artery stenosis, left ([place]) 05/18/2019   Claudication in peripheral vascular disease ([place]) 08/06/2017   Class 1 obesity due to excess calories with serious comorbidity and body mass index (BMI) of 31.0 to 31.9 in adult [id number]   Chronic systolic CHF (congestive heart failure) ([place]) 03/04/2017   Acute on chronic respiratory failure with hypoxia ([place]) 02/08/2017   COPD, severe ([place]) [id number]    Acute diastolic heart failure, NYHA class 1 ([place]) 02/08/2017   Fibromyalgia and chronic chest pain 02/08/2017   Atrial fibrillation with RVR ([place]) 02/08/2017   Acute heart failure ([place]) 02/08/2017   Rapid atrial fibrillation ([place]) 02/08/2017   Shortness of breath 02/08/2017   Moderate episode of recurrent major depressive disorder ([place]) 01/04/2017   Generalized anxiety disorder 01/04/2017   Pure hypercholesterolemia 12/28/2016   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   Acute blood loss anemia 08/03/2016   Carotid stenosis 04/02/2015   Long term current use of anticoagulant therapy 09/26/2014   PAF (paroxysmal atrial fibrillation) ([place]) 09/19/2014   Degenerative arthritis of lumbar spine 08/02/2014   Chronic respiratory failure with hypoxia ([place]) 06/27/2014   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 12/04/2013   Idiopathic scoliosis and kyphoscoliosis 07/19/2013   Hyperglycemia, drug-induced 10/12/2011   Encounter for long-term (current) use of medications 08/21/2011   Thrush 07/24/2011   PULMONARY NODULE, LEFT LOWER LOBE 10/14/2009   PATENT FORAMEN OVALE 10/14/2009   Essential hypertension 06/04/2009   G E R D 06/04/2009   SNORING, HX OF 06/04/2009   Past Medical History:  Diagnosis Date   Cataract    OD   CHF (congestive heart failure) ([place]) 03/02/2017   EF 123XX123 [id number]   Complication of anesthesia    various issues with oxygen  saturations post op   COPD (chronic obstructive pulmonary disease) ([place])    Depression    Depression    Phreesia 11/04/2020   Diverticulitis    DVT (deep venous thrombosis) (HCC)    Fibromyalgia    GERD (gastroesophageal reflux disease)    Heart murmur    Phreesia 11/04/2020   Hiatal hernia    History of deviated nasal septum    left- side   HTN (hypertension)    Hyperlipidemia    Hypertension    Phreesia 11/04/2020   Hypertensive retinopathy    OU   Myocardial infarction ([place])    Phreesia  11/04/2020   Obesity    On supplemental oxygen therapy    concentrator at night @ 1.5 l/m or when sleeps. O2 Sat niormally 87.   Osteoporosis    Osteoporosis    Phreesia 11/04/2020   Oxygen deficiency    Phreesia 11/04/2020   PAT (paroxysmal atrial tachycardia) (HCC)    PFO (patent foramen ovale)    PULMONARY NODULE, LEFT LOWER LOBE 10/14/2009   30m LLL nodule dec 2010. Stable and 413min Oct 2012. No further fu   PVD (peripheral vascular disease) with claudication ([place]   Right middle lobe pneumonia 07/24/2011   First noted at admit 07/10/11. Persists on cxr 07/22/11. Cleared on CT 08/24/11. No further followup   [facility]   TOBACCO ABUSE 06/04/2009   Past Surgical History:  Procedure Laterality Date   ABDOMINAL HYSTERECTOMY N/A    Phreesia 11/04/2020   CARDIOVASCULAR STRESS TEST  12/26/2004   EF 74%. NO EVIDENCE OF ISCHEMIA   CATARACT EXTRACTION  Left    Dr. Elliot Dally   ESOPHAGOGASTRODUODENOSCOPY (EGD) WITH PROPOFOL N/A 04/15/2015   Procedure: ESOPHAGOGASTRODUODENOSCOPY (EGD) WITH PROPOFOL;  Surgeon: Laurence Spates, MD;  Location: [facility];  Service: Endoscopy;  Laterality: N/A;   EYE SURGERY Left    Cat Sx   JOINT REPLACEMENT N/A    Phreesia 11/04/2020   KNEE ARTHROSCOPY  2000   left   LAPAROSCOPIC CHOLECYSTECTOMY  04-16-2010   [facility]   LOWER EXTREMITY ANGIOGRAPHY N/A 09/09/2017   Procedure: Lower Extremity Angiography;  Surgeon: Lorretta Harp, MD;  Location: [place] [facility];  Service: Cardiovascular;  Laterality: N/A;   LOWER EXTREMITY INTERVENTION Left 01/17/2018   Procedure: LOWER EXTREMITY INTERVENTION;  Surgeon: Lorretta Harp, MD;  Location: [place] [facility];  Service: Cardiovascular;  Laterality: Left;   MOUTH SURGERY     03-26-15 multiple extractions stitches remains   PERIPHERAL VASCULAR INTERVENTION Left 01/17/2018   Procedure: PERIPHERAL VASCULAR INTERVENTION;  Surgeon: Lorretta Harp, MD;  Location: [place] [facility];  Service:  Cardiovascular;  Laterality: Left;  COMMON ILIAC   RIGHT/LEFT HEART CATH AND CORONARY ANGIOGRAPHY N/A 03/04/2017   Procedure: Right/Left Heart Cath and Coronary Angiography;  Surgeon: Peter M Martinique, MD;  Location: [place] [facility];  Service: Cardiovascular;  Laterality: N/A;   TOTAL ABDOMINAL HYSTERECTOMY     post op needed oxygen was told "she gave them a scare"   TUBAL LIGATION     US ECHOCARDIOGRAPHY  11/20/2009   EF 55-60%   Allergies  Allergen Reactions   Alprazolam Anaphylaxis and Other (See Comments)    REACTION: stops breathing   Bee Venom Anaphylaxis   Iodine Anaphylaxis, Swelling and Other (See Comments)    REACTION: swelling in throat   Pseudoephedrine Hcl Er Shortness Of Breath   Budesonide-Formoterol Fumarate Other (See Comments)    Blisters inside of mouth all over   Crestor [Rosuvastatin Calcium] Other (See Comments)    Unable to walk   Esomeprazole Magnesium Other (See Comments)    REACTION: "bouncing off walls"   Flonase [Fluticasone Propionate] Other (See Comments)    NOSE BLEED   Lamictal [Lamotrigine] Rash    Patient got rash, labored breathing, and diarrhea   Loratadine Other (See Comments)    claritin D causes shaking   Lotrimin [Clotrimazole] Other (See Comments)    Mouth blisters   Lunesta [Eszopiclone] Other (See Comments)    REACTION: "slept for a week"   Oxcarbazepine Other (See Comments)    Causes deep sleep and dizziness   Statins Other (See Comments)    Can't walk, legs won't work    Zolpidem Tartrate Other (See Comments)    REACTION: "slept for a week"   Betadine [Povidone Iodine] Other (See Comments)    Breathing problems   Bevespi Aerosphere [Glycopyrrolate-Formoterol] Other (See Comments)    Pt believes this caused mouth sores and thrush    Clarithromycin Other (See Comments)    All "mycins", Puts into "a" fib, Will take if has to for severe sinus infection   Effexor [Venlafaxine] Nausea And Vomiting and Other (See Comments)    cramps    Lexapro [Escitalopram Oxalate] Other (See Comments)    hallucinations   Aciphex [Rabeprazole Sodium] Rash   Alendronate Sodium Other (See Comments)    "caused stomach problems for 3 days"   Avelox [Moxifloxacin Hcl In Nacl] Other (See Comments)    Stomach cramps.    Bextra [Valdecoxib] Rash   Ceclor [Cefaclor] Rash   Cephalexin Rash  and Other (See Comments)    Pt states that she is possibly allergic to this - had a reaction to Cefaclor in the past and she does not want to these class drugs. Added per patient request.   Covera-Hs [Verapamil Hcl] Palpitations   Dicyclomine Hcl Rash   Other Other (See Comments)    Glue from ekg/heart monitor leads --rash, Any MYCINS   Tessalon Perles Rash   Prior to Admission medications   Medication Sig Start Date End Date Taking? Authorizing Provider  nitroGLYCERIN (NITROSTAT) 0.4 MG SL tablet PLACE 1 TABLET UNDER THE TONGUE EVERY 5 MINUTES AS NEEDED FOR CHEST PAIN 06/30/21   Martinique, Peter M, MD  Alirocumab (PRALUENT) 75 MG/ML SOAJ Inject 75 mg into the skin every 14 (fourteen) days. 08/26/20   Martinique, Peter M, MD  carvedilol (COREG) 12.5 MG tablet TAKE 1 TABLET BY MOUTH TWICE A DAY 02/27/21   Martinique, Peter M, MD  clarithromycin (BIAXIN) 500 MG tablet Take 1 tablet (500 mg total) by mouth 2 (two) times daily. 06/23/21   Brand Males, MD  clopidogrel (PLAVIX) 75 MG tablet TAKE 1 TABLET BY MOUTH EVERY DAY 05/12/21   Martinique, Peter M, MD  desvenlafaxine (PRISTIQ) 50 MG 24 hr tablet Take 1 tablet (50 mg total) by mouth daily. 02/18/21   Arfeen, Arlyce Harman, MD  diclofenac sodium (VOLTAREN) 1 % GEL APPLY 2 GRAMS TO BOTH HANDS 4 TIMES A DAY 06/21/18   [provider]  Eyelid Cleansers (OCUSOFT BABY EYELID & EYELASH EX) Apply topically.    [provider]  furosemide (LASIX) 40 MG tablet TAKE 1 TABLET BY MOUTH EVERY DAY 10/21/20   Martinique, Peter M, MD  gabapentin (NEURONTIN) 100 MG capsule Take 200 mg by mouth See admin instructions. Take 1 in am and 1  in the lunch and 2 at bedtime 09/16/16   [provider]  HYDROcodone-acetaminophen (NORCO/VICODIN) 5-325 MG tablet Take 1 tablet by mouth 3 (three) times daily.    [provider]  ipratropium (ATROVENT) 0.02 % nebulizer solution USE 1 VIAL BY NEBULIZATION 4 (FOUR) TIMES DAILY. 05/12/21   Brand Males, MD  montelukast (SINGULAIR) 10 MG tablet TAKE 1 TABLET BY MOUTH EVERYDAY AT BEDTIME 02/17/21   [name] Agreste, MD  Multiple Vitamins-Minerals (VITAMIN D3 COMPLETE PO) Take by mouth.    [provider]  Omega-3 Fatty Acids (FISH OIL) 1000 MG CAPS Take by mouth daily.    [provider]  ondansetron (ZOFRAN-ODT) 8 MG disintegrating tablet Take 1 tablet (8 mg total) by mouth every 8 (eight) hours as needed for nausea or vomiting. 11/13/20   [name] Agreste, MD  oxybutynin (DITROPAN-XL) 10 MG 24 hr tablet Take 1 tablet (10 mg total) by mouth daily. 02/17/21   [name] Agreste, MD  OXYGEN Inhale 1.5-2 L into the lungs as needed (for shortness of breath).    [provider]  pantoprazole (PROTONIX) 40 MG tablet TAKE 1 TABLET BY MOUTH EVERY DAY 05/26/21   [name] Agreste, MD  polyethylene glycol [facility] / GLYCOLAX) packet Take 17 g by mouth daily as needed for mild constipation.     [provider]  predniSONE (DELTASONE) 10 MG tablet 4 x 1 day, 3 x 1 day 2 x 1 day, 1 x 1 day, 1/2 x 1 day and then stop 06/23/21   Brand Males, MD  spironolactone (ALDACTONE) 25 MG tablet TAKE 1 TABLET BY MOUTH EVERY DAY 03/10/21   Martinique, Peter M, MD  tiZANidine (ZANAFLEX)  2 MG tablet Take 2 mg by mouth at bedtime. Take one in noon and 1 bedtime    [provider]  traZODone (DESYREL) 100 MG tablet Take one tab daily as needed for sleep 02/18/21   Arfeen, Arlyce Harman, MD  triamcinolone cream (KENALOG) 0.1 % SMARTSIG:1 Sparingly Topical Twice Daily PRN 04/18/20   [provider]  warfarin (COUMADIN) 5 MG tablet TAKE 1/2 TO 1 TABLET BY MOUTH EVERY DAY  OR AS DIRECTED BY COUMADIN CLINIC 06/09/21   Martinique, Peter M, MD   Social History   Socioeconomic History   Marital status: Divorced    Spouse name: Not on file   Number of children: 2   Years of education: Not on file   Highest education level: Not on file  Occupational History   Occupation: disability  Tobacco Use   Smoking status: Former    Packs/day: 3.00    Years: 30.00    Pack years: 90.00    Types: Cigarettes    Quit date: 06/02/2010    Years since quitting: 11.0   Smokeless tobacco: Never   Tobacco comments:    QUIT IN 2011  Vaping Use   Vaping Use: Never used  Substance and Sexual Activity   Alcohol use: No    Alcohol/week: 0.0 standard drinks   Drug use: No   Sexual activity: Yes    Partners: Male    Birth control/protection: Post-menopausal, Surgical  Other Topics Concern   Not on file  Social History Narrative   Marital status: divorced in 1978 after ten years; dating casually in 2019.      Children: 3 biological children; 3 court appointed children; 6 grandchildren; 5 gg      Lives: alone; children in [place] and [place].      Employment: retired age 65; disability for COPD, CVA at age 29.      Tobacco: former smoker; quit smoking 2011.      Alcohol: none      Exercise: walks dog four times per day; goes to pool three times per week.      ADLs: drives; no assistant devices; does have a walker.  Cleaning is limited in 2018.  Daughter helps with cleaning.  Does own grocery shopping.      Advanced Directives: YES; DNR/DNI; HCPOA: Rolan Lipa Martin/daughter youngest.  Blind.               Social Determinants of Health   Financial Resource Strain: Not on file  Food Insecurity: Not on file  Transportation Needs: Not on file  Physical Activity: Not on file  Stress: Not on file  Social Connections: Not on file  Intimate Partner Violence: Not on file    Review of Systems Per HPI  Objective:   Vitals:   07/02/21 0846  BP: 124/68  Pulse: 75   Resp: 16  Temp: 98 F (36.7 C)  TempSrc: Temporal  SpO2: 91%  Weight: 182 lb 12.8 oz (82.9 kg)  Height: '5\' 4"'$  (1.626 m)     Physical Exam Vitals reviewed.  Constitutional:      General: She is not in acute distress.    Appearance: Normal appearance. She is well-developed. She is not ill-appearing, toxic-appearing or diaphoretic.  HENT:     Head: Normocephalic and atraumatic.     Ears:     Comments: Clear fluid behind bilateral TMs, no erythema, canals are clear, no exudate or canal erythema, no significant cerumen.  External ears nontender, no surrounding lymphadenopathy.  Jaw nontender,  Left temporal area tender without apparent cord or nodules. Eyes:     Conjunctiva/sclera: Conjunctivae normal.     Pupils: Pupils are equal, round, and reactive to light.     Comments: No nystagmus  Neck:     Vascular: No carotid bruit.     Comments: No carotid bruit. Cardiovascular:     Rate and Rhythm: Normal rate and regular rhythm.     Heart sounds: Normal heart sounds.  Pulmonary:     Effort: Pulmonary effort is normal.     Breath sounds: Normal breath sounds.  Abdominal:     Palpations: Abdomen is soft. There is no pulsatile mass.     Tenderness: There is no abdominal tenderness.  Musculoskeletal:     Right lower leg: No edema.     Left lower leg: No edema.  Skin:    General: Skin is warm and dry.  Neurological:     Mental Status: She is alert and oriented to person, place, and time.     GCS: GCS eye subscore is 4. GCS verbal subscore is 5. GCS motor subscore is 6.     Cranial Nerves: No cranial nerve deficit or facial asymmetry.     Motor: No pronator drift.     Coordination: Romberg sign negative.     Comments: Nonfocal neurologic exam, equal grip strength.  Ambulating without difficulty.  Psychiatric:        Mood and Affect: Mood normal.        Behavior: Behavior normal.        Thought Content: Thought content normal.   EKG, sinus rhythm with frequent PACs.   Compared to 06/17/2020, no apparent significant ST or T wave changes.   65 minutes spent during visit, with multiple added concerns  including chart review, counseling and assimilation of information, exam, discussion of plan, and chart completion,    Assessment & Plan:  Laurie Horton is a 75 y.o. female . Atherosclerosis of aorta ([place]) - Plan: Ambulatory referral to Cardiology Essential hypertension - Plan: Comprehensive metabolic panel Dizziness - Plan: Ambulatory referral to Cardiology, CBC, EKG [id number] Chronic systolic CHF (congestive heart failure) ([place]) - Plan: Ambulatory referral to Cardiology  -Recent increased dizziness, with episodes of chest tightness, no current chest pain, pressure, tightness during visit.  Given her history we will have her be seen sooner by cardiology, referral placed.  ER precautions given if return of chest tightness,/911 precautions.  No apparent acute findings on EKG in office.  Check labs above as well.  Prediabetes - Plan: Lipid panel, Hemoglobin A1c  -Check updated labs.  Watch diet, activity as determined by cardiology.  Pure hypercholesterolemia - Plan: Comprehensive metabolic panel, Lipid panel  -On Praluent, check updated labs.  Temporal pain - Plan: Sedimentation rate  -Denies headache, jaw claudication or pain, or amaurosis/visual symptoms.  Did report concerns from her ophthalmologist, and with temporal pain will check sed rate, if elevated may need temporal artery biopsy and consideration of high-dose prednisone in the meantime.  ER precautions given.  Urinary incontinence, unspecified type - Plan: oxybutynin (DITROPAN-XL) 10 MG 24 hr tablet  -Stable with oxybutynin, continue same.  Unlikely cause of recent dizziness.  Allergic rhinitis, unspecified seasonality, unspecified trigger - Plan: montelukast (SINGULAIR) 10 MG tablet Hearing loss left ear, with left ear pain  -Intolerant to multiple allergy meds as above, some continued symptoms.   Will refill Singulair, recommended ENT follow-up.  Additionally should discuss hearing changes with ENT, no acute findings  seen on exam.  May have component of congestion/middle ear effusion.   Meds ordered this encounter  Medications   montelukast (SINGULAIR) 10 MG tablet    Sig: TAKE 1 TABLET BY MOUTH EVERYDAY AT BEDTIME    Dispense:  90 tablet    Refill:  2   oxybutynin (DITROPAN-XL) 10 MG 24 hr tablet    Sig: Take 1 tablet (10 mg total) by mouth daily.    Dispense:  90 tablet    Refill:  2    Patient Instructions  I refilled oxybutynin Call ENT to discuss allergy symptoms and runny nose as you are unable to tolerate the medications I would usually recommend. Stay on montelukast.  Ear symptoms may also be related to allergies and congestion, but can also be discussed with ear nose and throat specialist. I will check sed rate, but that is not a specific test. If elevated, we can discuss next steps. Discuss at follow up in next few weeks.   I will refer you to be seen by cardiology for the dizziness and chest tightness. If you are having chest tightness again, I recommend being seen in the emergency room. Check oxygen level if you are feeling dizzy.   Return to the clinic or go to the nearest emergency room if any of your symptoms worsen or new symptoms occur.  Follow-up with me in the next 2 weeks and we can review some of the symptoms from today further as well as health maintenance.  Thank you for coming in today.  Dizziness Dizziness is a common problem. It is a feeling of unsteadiness or light-headedness. You may feel like you are about to faint. Dizziness can lead to injury if you stumble or fall. Anyone can become dizzy, but dizziness is more common in older adults. This condition can be caused by a number of things, including medicines, dehydration, or illness. Follow these instructions at home: Eating and drinking  Drink enough fluid to keep your urine pale yellow. This helps  to keep you from becoming dehydrated. Try to drink more clear fluids, such as water. Do not drink alcohol. Limit your caffeine intake if told to do so by your health care provider. Check ingredients and nutrition facts to see if a food or beverage contains caffeine. Limit your salt (sodium) intake if told to do so by your health care provider. Check ingredients and nutrition facts to see if a food or beverage contains sodium. Activity  Avoid making quick movements. Rise slowly from chairs and steady yourself until you feel okay. In the morning, first sit up on the side of the bed. When you feel okay, stand slowly while you hold onto something until you know that your balance is good. If you need to stand in one place for a long time, move your legs often. Tighten and relax the muscles in your legs while you are standing. Do not drive or use machinery if you feel dizzy. Avoid bending down if you feel dizzy. Place items in your home so that they are easy for you to reach without leaning over. Lifestyle Do not use any products that contain nicotine or tobacco. These products include cigarettes, chewing tobacco, and vaping devices, such as e-cigarettes. If you need help quitting, ask your health care provider. Try to reduce your stress level by using methods such as yoga or meditation. Talk with your health care provider if you need help to manage your stress. General instructions Watch your dizziness for any changes.  Take over-the-counter and prescription medicines only as told by your health care provider. Talk with your health care provider if you think that your dizziness is caused by a medicine that you are taking. Tell a friend or a family member that you are feeling dizzy. If he or she notices any changes in your behavior, have this person call your health care provider. Keep all follow-up visits. This is important. Contact a health care provider if: Your dizziness does not go away or you  have new symptoms. Your dizziness or light-headedness gets worse. You feel nauseous. You have reduced hearing. You have a fever. You have neck pain or a stiff neck. Your dizziness leads to an injury or a fall. Get help right away if: You vomit or have diarrhea and are unable to eat or drink anything. You have problems talking, walking, swallowing, or using your arms, hands, or legs. You feel generally weak. You have any bleeding. You are not thinking clearly or you have trouble forming sentences. It may take a friend or family member to notice this. You have chest pain, abdominal pain, shortness of breath, or sweating. Your vision changes or you develop a severe headache. These symptoms may represent a serious problem that is an emergency. Do not wait to see if the symptoms will go away. Get medical help right away. Call your local emergency services (911 in the [place] Do not drive yourself to the hospital. Summary Dizziness is a feeling of unsteadiness or light-headedness. This condition can be caused by a number of things, including medicines, dehydration, or illness. Anyone can become dizzy, but dizziness is more common in older adults. Drink enough fluid to keep your urine pale yellow. Do not drink alcohol. Avoid making quick movements if you feel dizzy. Monitor your dizziness for any changes. This information is not intended to replace advice given to you by your health care provider. Make sure you discuss any questions you have with your health care provider. Document Revised: 09/23/2020 Document Reviewed: 09/23/2020 Elsevier Patient Education  2022 [place],   Merri Ray, MD [place] 07/02/21 8:50 PM

## 2021-07-02 NOTE — Telephone Encounter (Signed)
Spoke to patient she stated she has been having chest pain and dizziness for the past month.Dr.Greene wanting her to be seen soon.Appointment scheduled with Coletta Memos NP 9/1 at 8:15 am.

## 2021-07-02 NOTE — Telephone Encounter (Signed)
Pt c/o of Chest Pain: STAT if CP now or developed within 24 hours  1. Are you having CP right now? Not currently with patent  2. Are you experiencing any other symptoms (ex. SOB, nausea, vomiting, sweating)? Has had dizziness  3. How long have you been experiencing CP? Has had dizziness and episodes of chest tightness for the past month  4. Is your CP continuous or coming and going? Comes and goes  5. Have you taken Nitroglycerin? Yes, twice ?   Jilda Roche from [place] states the patient has had dizziness the past month with a few episodes of chest tightness. She says she has taken NTG twice. She requests the call back go to the patient, but would like her and Dr. Carlota Raspberry notified when she is scheduled for an appointment. She states Dr. Carlota Raspberry is requesting the patient be seen within a week. I did not see anything until October.

## 2021-07-02 NOTE — Progress Notes (Addendum)
Cardiology Clinic Note   Patient Name: Laurie Horton Date of Encounter: 07/03/2021  Primary Care Provider:  [name] Agreste, MD Primary Cardiologist:  Peter Martinique, MD  Patient Profile    Laurie Horton 75 year old female presents the clinic today for evaluation of her chest pain and dizziness.  Past Medical History    Past Medical History:  Diagnosis Date   Cataract    OD   CHF (congestive heart failure) ([place]) 03/02/2017   EF [id number]   Complication of anesthesia    various issues with oxygen  saturations post op   COPD (chronic obstructive pulmonary disease) (HCC)    Depression    Depression    Phreesia 11/04/2020   Diverticulitis    DVT (deep venous thrombosis) (HCC)    Fibromyalgia    GERD (gastroesophageal reflux disease)    Heart murmur    Phreesia 11/04/2020   Hiatal hernia    History of deviated nasal septum    left- side   HTN (hypertension)    Hyperlipidemia    Hypertension    Phreesia 11/04/2020   Hypertensive retinopathy    OU   Myocardial infarction ([place])    Phreesia 11/04/2020   Obesity    On supplemental oxygen therapy    concentrator at night @ 1.5 l/m or when sleeps. O2 Sat niormally 87.   Osteoporosis    Osteoporosis    Phreesia 11/04/2020   Oxygen deficiency    Phreesia 11/04/2020   PAT (paroxysmal atrial tachycardia) (HCC)    PFO (patent foramen ovale)    PULMONARY NODULE, LEFT LOWER LOBE 10/14/2009   54m LLL nodule dec 2010. Stable and 458min Oct 2012. No further fu   PVD (peripheral vascular disease) with claudication [place]   Right middle lobe pneumonia 07/24/2011   First noted at admit 07/10/11. Persists on cxr 07/22/11. Cleared on CT 08/24/11. No further followup   [facility]   TOBACCO ABUSE 06/04/2009   Past Surgical History:  Procedure Laterality Date   ABDOMINAL HYSTERECTOMY N/A    Phreesia 11/04/2020   CARDIOVASCULAR STRESS TEST  12/26/2004   EF 74%. NO EVIDENCE OF ISCHEMIA   CATARACT EXTRACTION Left     Dr. R.Elliot Dally ESOPHAGOGASTRODUODENOSCOPY (EGD) WITH PROPOFOL N/A 04/15/2015   Procedure: ESOPHAGOGASTRODUODENOSCOPY (EGD) WITH PROPOFOL;  Surgeon: JaLaurence SpatesMD;  Location: [facility];  Service: Endoscopy;  Laterality: N/A;   EYE SURGERY Left    Cat Sx   JOINT REPLACEMENT N/A    Phreesia 11/04/2020   KNEE ARTHROSCOPY  2000   left   LAPAROSCOPIC CHOLECYSTECTOMY  04-16-2010   [facility]   LOWER EXTREMITY ANGIOGRAPHY N/A 09/09/2017   Procedure: Lower Extremity Angiography;  Surgeon: BeLorretta HarpMD;  [facility];  Service: Cardiovascular;  Laterality: N/A;   LOWER EXTREMITY INTERVENTION Left 01/17/2018   Procedure: LOWER EXTREMITY INTERVENTION;  Surgeon: BeLorretta HarpMD;  [facility];  Service: Cardiovascular;  Laterality: Left;   MOUTH SURGERY     03-26-15 multiple extractions stitches remains   PERIPHERAL VASCULAR INTERVENTION Left 01/17/2018   Procedure: PERIPHERAL VASCULAR INTERVENTION;  Surgeon: BeLorretta HarpMD;  [facility];  Service: Cardiovascular;  Laterality: Left;  COMMON ILIAC   RIGHT/LEFT HEART CATH AND CORONARY ANGIOGRAPHY N/A 03/04/2017   Procedure: Right/Left Heart Cath and Coronary Angiography;  Surgeon: Peter M JoMartiniqueMD;  [facility];  Service: Cardiovascular;  Laterality: N/A;  TOTAL ABDOMINAL HYSTERECTOMY     post op needed oxygen was told "she gave them a scare"   TUBAL LIGATION     US ECHOCARDIOGRAPHY  11/20/2009   EF 55-60%    Allergies  Allergies  Allergen Reactions   Alprazolam Anaphylaxis and Other (See Comments)    REACTION: stops breathing   Bee Venom Anaphylaxis   Iodine Anaphylaxis, Swelling and Other (See Comments)    REACTION: swelling in throat   Pseudoephedrine Hcl Er Shortness Of Breath   Budesonide-Formoterol Fumarate Other (See Comments)    Blisters inside of mouth all over   Crestor [Rosuvastatin Calcium] Other (See Comments)    Unable to walk   Esomeprazole  Magnesium Other (See Comments)    REACTION: "bouncing off walls"   Flonase [Fluticasone Propionate] Other (See Comments)    NOSE BLEED   Lamictal [Lamotrigine] Rash    Patient got rash, labored breathing, and diarrhea   Loratadine Other (See Comments)    claritin D causes shaking   Lotrimin [Clotrimazole] Other (See Comments)    Mouth blisters   Lunesta [Eszopiclone] Other (See Comments)    REACTION: "slept for a week"   Oxcarbazepine Other (See Comments)    Causes deep sleep and dizziness   Statins Other (See Comments)    Can't walk, legs won't work    Zolpidem Tartrate Other (See Comments)    REACTION: "slept for a week"   Betadine [Povidone Iodine] Other (See Comments)    Breathing problems   Bevespi Aerosphere [Glycopyrrolate-Formoterol] Other (See Comments)    Pt believes this caused mouth sores and thrush    Clarithromycin Other (See Comments)    All "mycins", Puts into "a" fib, Will take if has to for severe sinus infection   Effexor [Venlafaxine] Nausea And Vomiting and Other (See Comments)    cramps   Lexapro [Escitalopram Oxalate] Other (See Comments)    hallucinations   Aciphex [Rabeprazole Sodium] Rash   Alendronate Sodium Other (See Comments)    "caused stomach problems for 3 days"   Avelox [Moxifloxacin Hcl In Nacl] Other (See Comments)    Stomach cramps.    Bextra [Valdecoxib] Rash   Ceclor [Cefaclor] Rash   Cephalexin Rash and Other (See Comments)    Pt states that she is possibly allergic to this - had a reaction to Cefaclor in the past and she does not want to these class drugs. Added per patient request.   Covera-Hs [Verapamil Hcl] Palpitations   Dicyclomine Hcl Rash   Other Other (See Comments)    Glue from ekg/heart monitor leads --rash, Any MYCINS   Tessalon Perles Rash    History of Present Illness    Laurie Horton has a PMH of chronic diastolic congestive heart failure, PFO, paroxysmal atrial fibrillation on Coumadin, COPD on 2 L nasal  cannula, PAD, and CVA.  She underwent cardiac catheterization 5/18 which showed severe two-vessel obstructive CAD with 100% RCA, 75 percent OM1, 90% small OM 2 and an EF of 25-30%.  It was felt that her cardiomyopathy was from ischemia.  Medication management was recommended.  Follow-up echocardiogram 9/18 showed improvement in her EF to 50-55%.  She subsequently developed significant claudication.  She was seen by Dr. Gwenlyn Found.  He ordered angiography which showed 80% infrarenal aortic stenosis and left iliac stenosis.  She was noted to have severe right common femoral stenosis.  She was seen by Dr. Trula Slade for consideration of aortobifem bypass.  After pulmonary evaluation she was felt to be  too high risk for open surgery.  3/19 she underwent arthrectomy and stenting of the left iliac by Dr. Gwenlyn Found.  She was noted to have improvement in her claudication and ABIs.  7/19 she was  felt to be in atrial fibrillation after a dental procedure.  On arrival to the clinic she was noted to be in sinus rhythm with PACs.  No medical changes were made.  Dr. Gwenlyn Found continue to follow her with lower extremity Dopplers.  5/20 lower extremity Dopplers showed continued patency of her stent.  She no significant claudication was noted.  She also follows with pulmonology for her COPD.  1/21 she had a sudden loss of vision in her right eye.  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  Her INR has been therapeutic.  At the time of infarct her INR was 1.9.  Plavix was added to her medication regimen due to her extensive vascular disease.  An MRI showed no acute infarct but did show evidence of multiple old strokes.  She had follow-up lower extremity arterial Dopplers 5/21 which were stable.  Her carotid Dopplers in January showed no changes.  She developed PNA December/January.  She was seen by Dr. [name] for COPD exacerbation.  She had a CT at that time.  It showed an aortic aneurysm of 4.1 cm.  She was seen  in follow-up by Dr. Martinique on 02/12/2021.  During that time she reported chronic shortness of breath.  She denied significant angina.  She did note pain in her ribs beneath her right breast.  She noted mild palpitations.  Her claudication symptoms remained stable.  She continued to abstain from smoking and reported she had not smoked in 10 years.  Dr. Carlota Raspberry contacted cardiology office 07/01/2021 and reported that during her visit she noted dizziness x1 month and a few episodes of chest tightness.  She reported she had taken nitroglycerin twice.  She presents the clinic today for evaluation and states she has intermittent episodes of dizziness.  She noticed that these began around the time that she was experiencing her upper respiratory infection.  She reports that she has completed her prednisone and antibiotics.  She reports that she continues to hydrate well.  Her oxygen saturation is 94% on room air and she intermittently uses her oxygen concentrator with increased physical activity.  I reviewed her echocardiogram which shows normal sinus rhythm with PACs.  We reviewed her lab work from yesterday and she expressed understanding.  She questioned whether she wants more diuresis due to a feeling of bloating/swelling around her midsection afternoon.  We reviewed her weights which remained stable and have somewhat decreased by around 2 pounds.  She reports that she was seen and evaluated by her optometrist for pinpoint left temporal pain.  Her ophthalmic testing was unremarkable.  She also reports that she has had an area of this discomfort along the lateral side of her left breast.  She had mammography 1 week ago which showed 2 suspicious areas.  She will have ultrasound of her left breast on September 6.  I will have her maintain her heart healthy low-sodium diet, maintain her hydration, continue her current medication regimen, and have her follow-up with Dr. Martinique as scheduled.  Today she denies chest pain,  increased shortness of breath, lower extremity edema, fatigue, palpitations, melena, hematuria, hemoptysis, diaphoresis, weakness, presyncope, syncope, orthopnea, and PND.   Home Medications    Prior to Admission medications   Medication Sig Start Date End  Date Taking? Authorizing Provider  Alirocumab (PRALUENT) 75 MG/ML SOAJ Inject 75 mg into the skin every 14 (fourteen) days. 08/26/20   Martinique, Peter M, MD  carvedilol (COREG) 12.5 MG tablet TAKE 1 TABLET BY MOUTH TWICE A DAY 02/27/21   Martinique, Peter M, MD  clopidogrel (PLAVIX) 75 MG tablet TAKE 1 TABLET BY MOUTH EVERY DAY 05/12/21   Martinique, Peter M, MD  desvenlafaxine (PRISTIQ) 50 MG 24 hr tablet Take 1 tablet (50 mg total) by mouth daily. 02/18/21   Arfeen, Arlyce Harman, MD  diclofenac sodium (VOLTAREN) 1 % GEL APPLY 2 GRAMS TO BOTH HANDS 4 TIMES A DAY 06/21/18   [provider]  Eyelid Cleansers (OCUSOFT BABY EYELID & EYELASH EX) Apply topically.    [provider]  furosemide (LASIX) 40 MG tablet TAKE 1 TABLET BY MOUTH EVERY DAY 10/21/20   Martinique, Peter M, MD  gabapentin (NEURONTIN) 100 MG capsule Take 200 mg by mouth See admin instructions. Take 1 in am and 1 in the lunch and 2 at bedtime 09/16/16   [provider]  HYDROcodone-acetaminophen (NORCO/VICODIN) 5-325 MG tablet Take 1 tablet by mouth 3 (three) times daily.    [provider]  ipratropium (ATROVENT) 0.02 % nebulizer solution USE 1 VIAL BY NEBULIZATION 4 (FOUR) TIMES DAILY. 05/12/21   Brand Males, MD  montelukast (SINGULAIR) 10 MG tablet TAKE 1 TABLET BY MOUTH EVERYDAY AT BEDTIME 07/02/21   [name] Agreste, MD  Multiple Vitamins-Minerals (VITAMIN D3 COMPLETE PO) Take by mouth.    [provider]  nitroGLYCERIN (NITROSTAT) 0.4 MG SL tablet PLACE 1 TABLET UNDER THE TONGUE EVERY 5 MINUTES AS NEEDED FOR CHEST PAIN 06/30/21   Martinique, Peter M, MD  Omega-3 Fatty Acids (FISH OIL) 1000 MG CAPS Take by mouth daily.    [provider]   ondansetron (ZOFRAN-ODT) 8 MG disintegrating tablet Take 1 tablet (8 mg total) by mouth every 8 (eight) hours as needed for nausea or vomiting. 11/13/20   [name] Agreste, MD  oxybutynin (DITROPAN-XL) 10 MG 24 hr tablet Take 1 tablet (10 mg total) by mouth daily. 07/02/21   [name] Agreste, MD  OXYGEN Inhale 1.5-2 L into the lungs as needed (for shortness of breath).    [provider]  pantoprazole (PROTONIX) 40 MG tablet TAKE 1 TABLET BY MOUTH EVERY DAY 05/26/21   [name] Agreste, MD  polyethylene glycol [facility] / GLYCOLAX) packet Take 17 g by mouth daily as needed for mild constipation.     [provider]  spironolactone (ALDACTONE) 25 MG tablet TAKE 1 TABLET BY MOUTH EVERY DAY 03/10/21   Martinique, Peter M, MD  traZODone (DESYREL) 100 MG tablet Take one tab daily as needed for sleep 02/18/21   Arfeen, Arlyce Harman, MD  triamcinolone cream (KENALOG) 0.1 % SMARTSIG:1 Sparingly Topical Twice Daily PRN 04/18/20   [provider]  warfarin (COUMADIN) 5 MG tablet TAKE 1/2 TO 1 TABLET BY MOUTH EVERY DAY OR AS DIRECTED BY COUMADIN CLINIC 06/09/21   Martinique, Peter M, MD    Family History    Family History  Problem Relation Age of Onset   Dementia Mother    Diabetes Mother    Alzheimer's disease Mother    Heart attack Brother 61   Heart attack Father    Schizophrenia Sister    Diabetes Sister    Tremor Sister    She indicated that her mother is deceased. She indicated that her father is deceased. She indicated that her  brother is deceased. She indicated that her maternal grandmother is deceased. She indicated that her maternal grandfather is deceased. She indicated that her paternal grandmother is deceased. She indicated that her paternal grandfather is deceased.  Social History    Social History   Socioeconomic History   Marital status: Divorced    Spouse name: Not on file   Number of children: 2   Years of education: Not on file   Highest education level: Not on file   Occupational History   Occupation: disability  Tobacco Use   Smoking status: Former    Packs/day: 3.00    Years: 30.00    Pack years: 90.00    Types: Cigarettes    Quit date: 06/02/2010    Years since quitting: 11.0   Smokeless tobacco: Never   Tobacco comments:    QUIT IN 2011  Vaping Use   Vaping Use: Never used  Substance and Sexual Activity   Alcohol use: No    Alcohol/week: 0.0 standard drinks   Drug use: No   Sexual activity: Yes    Partners: Male    Birth control/protection: Post-menopausal, Surgical  Other Topics Concern   Not on file  Social History Narrative   Marital status: divorced in 1978 after ten years; dating casually in 2019.      Children: 3 biological children; 3 court appointed children; 6 grandchildren; 5 gg      Lives: alone; children in [place] and [place].      Employment: retired age 76; disability for COPD, CVA at age 66.      Tobacco: former smoker; quit smoking 2011.      Alcohol: none      Exercise: walks dog four times per day; goes to pool three times per week.      ADLs: drives; no assistant devices; does have a walker.  Cleaning is limited in 2018.  Daughter helps with cleaning.  Does own grocery shopping.      Advanced Directives: YES; DNR/DNI; HCPOA: Rolan Lipa Martin/daughter youngest.  Blind.               Social Determinants of Health   Financial Resource Strain: Not on file  Food Insecurity: Not on file  Transportation Needs: Not on file  Physical Activity: Not on file  Stress: Not on file  Social Connections: Not on file  Intimate Partner Violence: Not on file     Review of Systems    General:  No chills, fever, night sweats or weight changes.  Cardiovascular:  No chest pain, dyspnea on exertion, edema, orthopnea, palpitations, paroxysmal nocturnal dyspnea. Dermatological: No rash, lesions/masses Respiratory: No cough, dyspnea Urologic: No hematuria, dysuria Abdominal:   No nausea, vomiting, diarrhea, bright red  blood per rectum, melena, or hematemesis Neurologic:  No visual changes, wkns, changes in mental status. All other systems reviewed and are otherwise negative except as noted above.  Physical Exam    VS:  BP 100/72   Pulse 75   Ht '5\' 4"'$  (1.626 m)   Wt 182 lb (82.6 kg)   SpO2 94%   BMI 31.24 kg/m  , BMI Body mass index is 31.24 kg/m. GEN: Well nourished, well developed, in no acute distress. HEENT: normal. Neck: Supple, no JVD, carotid bruits, or masses. Cardiac: RRR, no murmurs, rubs, or gallops. No clubbing, cyanosis, edema.  Radials/DP/PT 2+ and equal bilaterally.  Respiratory:  Respirations regular and unlabored, clear to auscultation bilaterally. GI: Soft, nontender, nondistended, BS + x 4. MS: no  deformity or atrophy. Skin: warm and dry, no rash. Neuro:  Strength and sensation are intact. Psych: Normal affect.  Accessory Clinical Findings    Recent Labs: 07/02/2021: ALT 13; BUN 13; Creatinine, Ser 0.99; Hemoglobin 14.0; Platelets 230.0; Potassium 4.0; Sodium 132   Recent Lipid Panel    Component Value Date/Time   CHOL 132 07/02/2021 1017   CHOL 115 09/17/2020 0946   TRIG 220.0 (H) 07/02/2021 1017   HDL 41.80 07/02/2021 1017   HDL 36 (L) 09/17/2020 0946   CHOLHDL 3 07/02/2021 1017   VLDL 44.0 (H) 07/02/2021 1017   LDLCALC 52 09/17/2020 0946   LDLDIRECT 67.0 07/02/2021 1017    ECG personally reviewed by me today-none today.  Echocardiogram 07/08/2017  Study Conclusions   - Left ventricle: The cavity size was normal. There was moderate    concentric hypertrophy. Systolic function was normal. The    estimated ejection fraction was in the range of 50% to 55%.    Severe hypokinesis of the basal-midinferior myocardium;    consistent with infarction in the distribution of the right    coronary artery. Doppler parameters are consistent with abnormal    left ventricular relaxation (grade 1 diastolic dysfunction).  - Mitral valve: Calcified annulus.   Impressions:    - Compared to April 2018 there is marked improvement in contraction    of all LV wall segemnts except for the inferior wall, which    remains severely hypokinetic. There is also marked reduction in    the severity of mitral insufficiency.   Cardiac catheterization 03/04/2017  Ost 1st Mrg to 1st Mrg lesion, 75 %stenosed. 2nd Mrg lesion, 90 %stenosed. Ost RCA to Mid RCA lesion, 100 %stenosed. There is severe left ventricular systolic dysfunction. LV end diastolic pressure is normal. The left ventricular ejection fraction is 25-35% by visual estimate. Hemodynamic findings consistent with mild pulmonary hypertension. LV end diastolic pressure is normal.   1. Severe 2 vessel obstructive CAD    - 75% proximal OM1    - 90% small OM2    - 100% proximal RCA. Left to right collaterals.  2. Severe LV dysfunction 3. Mild pulmonary HTN with normal LV filling pressures.  4. Cardiac index 2.41 L/min/BSA     Plan: Medical management to try and optimize CHF therapy. Patient appears to be adequately diuresed at this time. Based on these results her cardiomyopathy is ischemic. I would treat her CAD medically. If cardiac cath is needed in the future would consider alternative access given difficulty from the right radial approach. Her rhythm during procedure is a multifocal atrial rhythm/tachycardia.   Diagnostic Dominance: Right Intervention  Assessment & Plan   1.  Dizziness-denies dizziness today.  Reports intermittent episodes of dizziness since her upper respiratory infection.  Reports several episodes of dizziness over the past month.  Denies syncope, palpitations, and lightheadedness.   Maintain p.o. hydration Move from sitting to standing position slowly  Chest discomfort-reports 2 episodes of chest tightness over the past month where she did use nitroglycerin.  Underwent cardiac catheterization 03/04/2017 which showed 75% stenosis first marginal and 100% RCA lesion.  Left to right  collaterals noted.  Details above.  Medical management was recommended.  EF at that time was 25-30% but improved to 50-55% on 07/08/2017 echocardiogram.  ARB discontinued previously due to orthostatic dizziness.  Chest discomfort appears to be related to chest wall pain/left breast pain. Continue carvedilol, Plavix, omega-3 fatty acids, spironolactone Heart healthy  diet-salty 6 given Increase physical activity as  tolerated  Essential hypertension-BP today 100/72.  Well-controlled at home. Continue carvedilol, spironolactone Heart healthy diet-salty 6 given Increase physical activity as tolerated  Chronic combined systolic and diastolic CHF-no increased DOE or activity intolerance.  Felt to be secondary to ischemic versus tachycardia mediated cardiomyopathy. Continue carvedilol, spironolactone  Paroxysmal atrial fibrillation-no recent episodes of increased or accelerated heartbeats.  CHA2DS2-VASc score 6 (CHF, hypertension, female, age, CVA).  Reports compliance with Coumadin and denies bleeding issues. Continue Coumadin, carvedilol  COPD-oxygen saturation today 94 percent.  Regularly using 2 L of oxygen with increased physical activity. Follows with pulmonology  PFO-not a candidate for PFO closure.  Peripheral arterial disease-denies claudication.  Status post stenting of the left iliac.  Distal aorta and right femoral not amenable to percutaneous intervention.  Also with left subclavian stenosis Follows with Dr. Gwenlyn Found  Disposition: Follow-up with Dr. Martinique as scheduled.  Jossie Ng. Bianey Tesoro NP-C    07/03/2021, 1:54 PM [facility] [place] [address] Office [phone] Fax [phone]  Notice: This dictation was prepared with Dragon dictation along with smaller phrase technology. Any transcriptional errors that result from this process are unintentional and may not be corrected upon review.  I spent 13 minutes examining this patient, reviewing  medications, and using patient centered shared decision making involving her cardiac care.  Prior to her visit I spent greater than 20 minutes reviewing her past medical history,  medications, and prior cardiac tests.

## 2021-07-02 NOTE — Patient Instructions (Addendum)
I refilled oxybutynin Call ENT to discuss allergy symptoms and runny nose as you are unable to tolerate the medications I would usually recommend. Stay on montelukast.  Ear symptoms may also be related to allergies and congestion, but can also be discussed with ear nose and throat specialist. I will check sed rate, but that is not a specific test. If elevated, we can discuss next steps. Discuss at follow up in next few weeks.   I will refer you to be seen by cardiology for the dizziness and chest tightness. If you are having chest tightness again, I recommend being seen in the emergency room. Check oxygen level if you are feeling dizzy.   Return to the clinic or go to the nearest emergency room if any of your symptoms worsen or new symptoms occur.  Follow-up with me in the next 2 weeks and we can review some of the symptoms from today further as well as health maintenance.  Thank you for coming in today.  Dizziness Dizziness is a common problem. It is a feeling of unsteadiness or light-headedness. You may feel like you are about to faint. Dizziness can lead to injury if you stumble or fall. Anyone can become dizzy, but dizziness is more common in older adults. This condition can be caused by a number of things, including medicines, dehydration, or illness. Follow these instructions at home: Eating and drinking  Drink enough fluid to keep your urine pale yellow. This helps to keep you from becoming dehydrated. Try to drink more clear fluids, such as water. Do not drink alcohol. Limit your caffeine intake if told to do so by your health care provider. Check ingredients and nutrition facts to see if a food or beverage contains caffeine. Limit your salt (sodium) intake if told to do so by your health care provider. Check ingredients and nutrition facts to see if a food or beverage contains sodium. Activity  Avoid making quick movements. Rise slowly from chairs and steady yourself until you feel  okay. In the morning, first sit up on the side of the bed. When you feel okay, stand slowly while you hold onto something until you know that your balance is good. If you need to stand in one place for a long time, move your legs often. Tighten and relax the muscles in your legs while you are standing. Do not drive or use machinery if you feel dizzy. Avoid bending down if you feel dizzy. Place items in your home so that they are easy for you to reach without leaning over. Lifestyle Do not use any products that contain nicotine or tobacco. These products include cigarettes, chewing tobacco, and vaping devices, such as e-cigarettes. If you need help quitting, ask your health care provider. Try to reduce your stress level by using methods such as yoga or meditation. Talk with your health care provider if you need help to manage your stress. General instructions Watch your dizziness for any changes. Take over-the-counter and prescription medicines only as told by your health care provider. Talk with your health care provider if you think that your dizziness is caused by a medicine that you are taking. Tell a friend or a family member that you are feeling dizzy. If he or she notices any changes in your behavior, have this person call your health care provider. Keep all follow-up visits. This is important. Contact a health care provider if: Your dizziness does not go away or you have new symptoms. Your dizziness or light-headedness  gets worse. You feel nauseous. You have reduced hearing. You have a fever. You have neck pain or a stiff neck. Your dizziness leads to an injury or a fall. Get help right away if: You vomit or have diarrhea and are unable to eat or drink anything. You have problems talking, walking, swallowing, or using your arms, hands, or legs. You feel generally weak. You have any bleeding. You are not thinking clearly or you have trouble forming sentences. It may take a friend or  family member to notice this. You have chest pain, abdominal pain, shortness of breath, or sweating. Your vision changes or you develop a severe headache. These symptoms may represent a serious problem that is an emergency. Do not wait to see if the symptoms will go away. Get medical help right away. Call your local emergency services (911 in the [place] Do not drive yourself to the hospital. Summary Dizziness is a feeling of unsteadiness or light-headedness. This condition can be caused by a number of things, including medicines, dehydration, or illness. Anyone can become dizzy, but dizziness is more common in older adults. Drink enough fluid to keep your urine pale yellow. Do not drink alcohol. Avoid making quick movements if you feel dizzy. Monitor your dizziness for any changes. This information is not intended to replace advice given to you by your health care provider. Make sure you discuss any questions you have with your health care provider. Document Revised: 09/23/2020 Document Reviewed: 09/23/2020 Elsevier Patient Education  2022 [employer].

## 2021-07-03 ENCOUNTER — Ambulatory Visit (INDEPENDENT_AMBULATORY_CARE_PROVIDER_SITE_OTHER): Payer: Medicare Other | Admitting: General Practice

## 2021-07-03 ENCOUNTER — Encounter: Payer: Self-pay | Admitting: General Practice

## 2021-07-03 VITALS — BP 100/72 | HR 75 | Ht 64.0 in | Wt 182.0 lb

## 2021-07-03 DIAGNOSIS — I10 Essential (primary) hypertension: Secondary | ICD-10-CM

## 2021-07-03 DIAGNOSIS — R0789 Other chest pain: Secondary | ICD-10-CM | POA: Diagnosis not present

## 2021-07-03 DIAGNOSIS — R42 Dizziness and giddiness: Secondary | ICD-10-CM

## 2021-07-03 DIAGNOSIS — Q2112 Patent foramen ovale: Secondary | ICD-10-CM

## 2021-07-03 DIAGNOSIS — I739 Peripheral vascular disease, unspecified: Secondary | ICD-10-CM

## 2021-07-03 DIAGNOSIS — I5022 Chronic systolic (congestive) heart failure: Secondary | ICD-10-CM | POA: Diagnosis not present

## 2021-07-03 DIAGNOSIS — I480 Paroxysmal atrial fibrillation: Secondary | ICD-10-CM

## 2021-07-03 DIAGNOSIS — Q211 Atrial septal defect: Secondary | ICD-10-CM

## 2021-07-03 NOTE — Patient Instructions (Signed)
Medication Instructions:  The current medical regimen is effective;  continue present plan and medications as directed. Please refer to the Current Medication list given to you today.   *If you need a refill on your cardiac medications before your next appointment, please call your pharmacy*  Lab Work:   Testing/Procedures:  NONE    NONE  Special Instructions PLEASE READ AND FOLLOW SALTY 6-ATTACHED-1,'800mg'$  daily  PLEASE INCREASE PHYSICAL ACTIVITY AS TOLERATED,   STAY HYDRATED  Follow-Up: Your next appointment:  KEEP SCHEDULED DR Martinique APPOINTMENT In Person with Peter Martinique, MD   At [facility], you and your health needs are our priority.  As part of our continuing mission to provide you with exceptional heart care, we have created designated Provider Care Teams.  These Care Teams include your primary Cardiologist (physician) and Advanced Practice Providers (APPs -  Physician Assistants and Nurse Practitioners) who all work together to provide you with the care you need, when you need it.            6 SALTY THINGS TO AVOID     1,'800MG'$  DAILY

## 2021-07-08 LAB — HM MAMMOGRAPHY

## 2021-07-16 ENCOUNTER — Other Ambulatory Visit: Payer: Self-pay

## 2021-07-16 ENCOUNTER — Ambulatory Visit (INDEPENDENT_AMBULATORY_CARE_PROVIDER_SITE_OTHER): Payer: Medicare Other | Admitting: Family Medicine

## 2021-07-16 ENCOUNTER — Encounter: Payer: Self-pay | Admitting: Family Medicine

## 2021-07-16 ENCOUNTER — Ambulatory Visit (INDEPENDENT_AMBULATORY_CARE_PROVIDER_SITE_OTHER)
Admission: RE | Admit: 2021-07-16 | Discharge: 2021-07-16 | Disposition: A | Discharge status: Home / Self Care | Payer: Medicare Other | Source: Ambulatory Visit | Attending: Family Medicine | Admitting: Family Medicine

## 2021-07-16 VITALS — BP 138/80 | HR 65 | Temp 97.9°F | Resp 16 | Ht 64.0 in | Wt 182.6 lb

## 2021-07-16 DIAGNOSIS — E871 Hypo-osmolality and hyponatremia: Secondary | ICD-10-CM | POA: Diagnosis not present

## 2021-07-16 DIAGNOSIS — I5022 Chronic systolic (congestive) heart failure: Secondary | ICD-10-CM

## 2021-07-16 DIAGNOSIS — R0600 Dyspnea, unspecified: Secondary | ICD-10-CM | POA: Diagnosis not present

## 2021-07-16 DIAGNOSIS — R059 Cough, unspecified: Secondary | ICD-10-CM | POA: Diagnosis not present

## 2021-07-16 DIAGNOSIS — J449 Chronic obstructive pulmonary disease, unspecified: Secondary | ICD-10-CM

## 2021-07-16 DIAGNOSIS — H9192 Unspecified hearing loss, left ear: Secondary | ICD-10-CM | POA: Diagnosis not present

## 2021-07-16 DIAGNOSIS — H9202 Otalgia, left ear: Secondary | ICD-10-CM | POA: Diagnosis not present

## 2021-07-16 LAB — BASIC METABOLIC PANEL
BUN: 12 mg/dL (ref 6–23)
CO2: 30 mEq/L (ref 19–32)
Calcium: 9.9 mg/dL (ref 8.4–10.5)
Chloride: 93 mEq/L — ABNORMAL LOW (ref 96–112)
Creatinine, Ser: 1 mg/dL (ref 0.40–1.20)
GFR: 55.07 mL/min — ABNORMAL LOW (ref >60.00)
Glucose, Bld: 103 mg/dL — ABNORMAL HIGH (ref 70–99)
Potassium: 3.9 mEq/L (ref 3.5–5.1)
Sodium: 133 mEq/L — ABNORMAL LOW (ref 135–145)

## 2021-07-16 NOTE — Patient Instructions (Addendum)
I will refer you to ENT for the left ear symptoms.  I will recheck labs for the low sodium level on recent labs. No med changes for now.  I will check xray and heart failure test today to evaluate your shortness of breath.  Try increasing the atrovent to 3-4 times per day. If shortness of breath and cough is not better with higher dose atrovent in next 2 days, let me know and we can discuss more prednisone if needed.  Keep follow up with pulmonary next week. If any worsening symptoms be seen in ER or call 911.

## 2021-07-16 NOTE — Progress Notes (Signed)
Subjective:  Patient ID: [name], female    DOB: 06/17/1946  Age: 75 y.o. MRN: [id number]  CC:  Chief Complaint  Patient presents with   Pneumonia    Pt still recovering sees pulmonary next week, pt doing about the same advised to return for recheck and HM    Chest Pain    Pt was referred to cardiology who has reported no change keep follow ups     HPI MAICY CHEADLE presents for   Follow-up from August 31 visit.   Chest pain/tightness/dizziness History of CAD, CHF, atrial fibrillation, peripheral vascular disease, subclavian artery stenosis, ischemic cardiomyopathy.  Also with prior CVA, history of PFO but not a candidate for closure.  Cardiology Dr. Martinique, Dr. Gwenlyn Found, on Coumadin for anticoagulation, and status post stenting of her left iliac for PAD, on Plavix.  Takes Praluent for hyperlipidemia.  Noted episodes of dizziness over the previous month at her August 31 visit.  Did have some chest tightness with the dizziness, took nitroglycerin twice that month.  Blood pressure was 110/67 when she was dizzy.  No room spinning.  She does have a history of COPD with chronic hypoxemic respiratory failure and a chronic systolic CHF.  Nighttime exertional oxygen supplementation, but recommended she monitor her O2 sat if dizziness returned to rule out daytime hypoxemia.  Had been recently treated with Biaxin, prednisone for possible repeat pneumonia earlier in August.  Symptomatically improving at her August 31 visit. Has appointment with pulmonary next week, September 20, Dr. [name]. Cardiology eval reviewed September 1.  Recommended maintaining p.o. hydration and move from sitting to standing position slowly.  Chest discomfort was thought to be related to chest wall pain or breast pain.  Was continued on carvedilol, Plavix, omega-3, spironolactone, heart healthy diet increase physical activity as tolerated.  No change in dizziness - same. No O2 sat machine to check levels. No  fevers, still some shortness of breath. Feels like dyspnea worse past week. Coughing up phlegm - yellow. More phlegm past week. Shorter course of prednisone most recent time. Did complete course of prednisone and Biaxin few weeks ago (rx  by pulmonary on 06/23/21, finished 07/01/21). Also feels like she is retaining more fluid. Sometimes swelling in ankles, bloated in afternoon at times.  Hx CHF. Echo 07/2017 EF 50-55% (prior 25-30% on cath in 03/2017).  Weight stable last 3 visits.  Using oxygen concentrator with naps, and at night - 2 liters. With activity 2 liters - uses portable oxygen [employer] - not working, company is aware. Has been using concentrator in place of [place].  Atrovent neb once in am. Has used up to 3-4  times per day during illness - has not tried increasing frequency this week.   Wt Readings from Last 3 Encounters:  07/16/21 182 lb 9.6 oz (82.8 kg)  07/03/21 182 lb (82.6 kg)  07/02/21 182 lb 12.8 oz (82.9 kg)    Allergic rhinitis, left ear pain, hearing loss of left ear Discussed at her August 31 visit.  Unfortunately intolerant to multiple allergy medications.  Continued on Singulair and recommended she follow-up with ENT including for the hearing changes.  No acute findings were seen on her exam. Does not have regular ENT - saw one in past - did not like, would like to see different provider.  Temporal pain. Discussed last visit, had been seen by her ophthalmologist with concern for temporal arteritis/giant cell arteritis.  Recent sed rate was normal at 22 on 07/02/2021.  Denied any jaw claudication, headache, amaurosis or visual symptoms.  Hyponatremia Sodium 132 on labs August 31.  Previously 136.   Health maintenance Bone density - scheduling on 07/21/21.  Declines flu shot - had reaction prior.  Not getting covid vaccine or shingles vaccine.      History Patient Active Problem List   Diagnosis Date Noted   Leg pain, bilateral 08/06/2020   CRAO (central retinal  artery occlusion), right 11/23/2019   Pain in right shoulder 11/08/2019   Multiple drug allergies 09/15/2019   Pain in left shoulder 06/21/2019   Subclavian artery stenosis, left ([place]) 05/18/2019   Claudication in peripheral vascular disease ([place]) 08/06/2017   Class 1 obesity due to excess calories with serious comorbidity and body mass index (BMI) of 31.0 to 31.9 in adult [id number]   Chronic systolic CHF (congestive heart failure) ([place]) 03/04/2017   Acute on chronic respiratory failure with hypoxia ([place]) 02/08/2017   COPD, severe ([place]) [id number]   Acute diastolic heart failure, NYHA class 1 ([place]) 02/08/2017   Fibromyalgia and chronic chest pain 02/08/2017   Atrial fibrillation with RVR ([place]) 02/08/2017   Acute heart failure ([place]) 02/08/2017   Rapid atrial fibrillation ([place]) 02/08/2017   Shortness of breath 02/08/2017   Moderate episode of recurrent major depressive disorder ([place]) 01/04/2017   Generalized anxiety disorder 01/04/2017   Pure hypercholesterolemia 12/28/2016   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   Acute blood loss anemia 08/03/2016   Carotid stenosis 04/02/2015   Long term current use of anticoagulant therapy 09/26/2014   PAF (paroxysmal atrial fibrillation) ([place]) 09/19/2014   Degenerative arthritis of lumbar spine 08/02/2014   Chronic respiratory failure with hypoxia ([place]) 06/27/2014   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 12/04/2013   Idiopathic scoliosis and kyphoscoliosis 07/19/2013   Hyperglycemia, drug-induced 10/12/2011   Encounter for long-term (current) use of medications 08/21/2011   Thrush 07/24/2011   PULMONARY NODULE, LEFT LOWER LOBE 10/14/2009   PATENT FORAMEN OVALE 10/14/2009   Essential hypertension 06/04/2009   G E R D 06/04/2009   SNORING, HX OF 06/04/2009   Past Medical History:  Diagnosis Date   Cataract    OD   CHF (congestive heart failure) ([place]) 03/02/2017   EF [id number] [id number]   Complication of  anesthesia    various issues with oxygen  saturations post op   COPD (chronic obstructive pulmonary disease) ([place])    Depression    Depression    Phreesia 11/04/2020   Diverticulitis    DVT (deep venous thrombosis) (HCC)    Fibromyalgia    GERD (gastroesophageal reflux disease)    Heart murmur    Phreesia 11/04/2020   Hiatal hernia    History of deviated nasal septum    left- side   HTN (hypertension)    Hyperlipidemia    Hypertension    Phreesia 11/04/2020   Hypertensive retinopathy    OU   Myocardial infarction ([place])    Phreesia 11/04/2020   Obesity    On supplemental oxygen therapy    concentrator at night @ 1.5 l/m or when sleeps. O2 Sat niormally 87.   Osteoporosis    Osteoporosis    Phreesia 11/04/2020   Oxygen deficiency    Phreesia 11/04/2020   PAT (paroxysmal atrial tachycardia) (HCC)    PFO (patent foramen ovale)    PULMONARY NODULE, LEFT LOWER LOBE 10/14/2009   17m LLL nodule dec 2010. Stable and 466min Oct 2012. No further fu   PVD (peripheral  vascular disease) with claudication ([place]) 12/2017   Right middle lobe pneumonia 07/24/2011   First noted at admit 07/10/11. Persists on cxr 07/22/11. Cleared on CT 08/24/11. No further followup   Stroke [facility])    TOBACCO ABUSE 06/04/2009   Past Surgical History:  Procedure Laterality Date   ABDOMINAL HYSTERECTOMY N/A    Phreesia 11/04/2020   CARDIOVASCULAR STRESS TEST  12/26/2004   EF 74%. NO EVIDENCE OF ISCHEMIA   CATARACT EXTRACTION Left    Dr. Elliot Dally   ESOPHAGOGASTRODUODENOSCOPY (EGD) WITH PROPOFOL N/A 04/15/2015   Procedure: ESOPHAGOGASTRODUODENOSCOPY (EGD) WITH PROPOFOL;  Surgeon: Laurence Spates, MD;  Location: [facility];  Service: Endoscopy;  Laterality: N/A;   EYE SURGERY Left    Cat Sx   JOINT REPLACEMENT N/A    Phreesia 11/04/2020   KNEE ARTHROSCOPY  2000   left   LAPAROSCOPIC CHOLECYSTECTOMY  04-16-2010   [facility]   LOWER EXTREMITY ANGIOGRAPHY N/A 09/09/2017   Procedure: Lower Extremity Angiography;   Surgeon: Lorretta Harp, MD;  Location: [place] [facility];  Service: Cardiovascular;  Laterality: N/A;   LOWER EXTREMITY INTERVENTION Left 01/17/2018   Procedure: LOWER EXTREMITY INTERVENTION;  Surgeon: Lorretta Harp, MD;  Location: [place] CV LAB;  Service: Cardiovascular;  Laterality: Left;   MOUTH SURGERY     03-26-15 multiple extractions stitches remains   PERIPHERAL VASCULAR INTERVENTION Left 01/17/2018   Procedure: PERIPHERAL VASCULAR INTERVENTION;  Surgeon: Lorretta Harp, MD;  Location: [place] CV LAB;  Service: Cardiovascular;  Laterality: Left;  COMMON ILIAC   RIGHT/LEFT HEART CATH AND CORONARY ANGIOGRAPHY N/A 03/04/2017   Procedure: Right/Left Heart Cath and Coronary Angiography;  Surgeon: Peter M Martinique, MD;  Location: [place] [facility];  Service: Cardiovascular;  Laterality: N/A;   TOTAL ABDOMINAL HYSTERECTOMY     post op needed oxygen was told "she gave them a scare"   TUBAL LIGATION     US ECHOCARDIOGRAPHY  11/20/2009   EF 55-60%   Allergies  Allergen Reactions   Alprazolam Anaphylaxis and Other (See Comments)    REACTION: stops breathing   Bee Venom Anaphylaxis   Iodine Anaphylaxis, Swelling and Other (See Comments)    REACTION: swelling in throat   Pseudoephedrine Hcl Er Shortness Of Breath   Budesonide-Formoterol Fumarate Other (See Comments)    Blisters inside of mouth all over   Crestor [Rosuvastatin Calcium] Other (See Comments)    Unable to walk   Esomeprazole Magnesium Other (See Comments)    REACTION: "bouncing off walls"   Flonase [Fluticasone Propionate] Other (See Comments)    NOSE BLEED   Lamictal [Lamotrigine] Rash    Patient got rash, labored breathing, and diarrhea   Loratadine Other (See Comments)    claritin D causes shaking   Lotrimin [Clotrimazole] Other (See Comments)    Mouth blisters   Lunesta [Eszopiclone] Other (See Comments)    REACTION: "slept for a week"   Oxcarbazepine Other (See Comments)    Causes deep sleep and  dizziness   Statins Other (See Comments)    Can't walk, legs won't work    Zolpidem Tartrate Other (See Comments)    REACTION: "slept for a week"   Betadine [Povidone Iodine] Other (See Comments)    Breathing problems   Bevespi Aerosphere [Glycopyrrolate-Formoterol] Other (See Comments)    Pt believes this caused mouth sores and thrush    Clarithromycin Other (See Comments)    All "mycins", Puts into "a" fib, Will take if has to for severe sinus infection  Effexor [Venlafaxine] Nausea And Vomiting and Other (See Comments)    cramps   Lexapro [Escitalopram Oxalate] Other (See Comments)    hallucinations   Aciphex [Rabeprazole Sodium] Rash   Alendronate Sodium Other (See Comments)    "caused stomach problems for 3 days"   Avelox [Moxifloxacin Hcl In Nacl] Other (See Comments)    Stomach cramps.    Bextra [Valdecoxib] Rash   Ceclor [Cefaclor] Rash   Cephalexin Rash and Other (See Comments)    Pt states that she is possibly allergic to this - had a reaction to Cefaclor in the past and she does not want to these class drugs. Added per patient request.   Covera-Hs [Verapamil Hcl] Palpitations   Dicyclomine Hcl Rash   Other Other (See Comments)    Glue from ekg/heart monitor leads --rash, Any MYCINS   Tessalon Perles Rash   Prior to Admission medications   Medication Sig Start Date End Date Taking? Authorizing Provider  Alirocumab (PRALUENT) 75 MG/ML SOAJ Inject 75 mg into the skin every 14 (fourteen) days. 08/26/20  Yes Martinique, Peter M, MD  carvedilol (COREG) 12.5 MG tablet TAKE 1 TABLET BY MOUTH TWICE A DAY 02/27/21  Yes Martinique, Peter M, MD  clopidogrel (PLAVIX) 75 MG tablet TAKE 1 TABLET BY MOUTH EVERY DAY 05/12/21  Yes Martinique, Peter M, MD  desvenlafaxine (PRISTIQ) 50 MG 24 hr tablet Take 1 tablet (50 mg total) by mouth daily. 02/18/21  Yes Arfeen, Arlyce Harman, MD  diclofenac sodium (VOLTAREN) 1 % GEL APPLY 2 GRAMS TO BOTH HANDS 4 TIMES A DAY 06/21/18  Yes [provider]  Eyelid  Cleansers (OCUSOFT BABY EYELID & EYELASH EX) Apply topically.   Yes [provider]  furosemide (LASIX) 40 MG tablet TAKE 1 TABLET BY MOUTH EVERY DAY 10/21/20  Yes Martinique, Peter M, MD  gabapentin (NEURONTIN) 100 MG capsule Take 200 mg by mouth See admin instructions. Take 1 in am and 1 in the lunch and 2 at bedtime 09/16/16  Yes [provider]  HYDROcodone-acetaminophen (NORCO/VICODIN) 5-325 MG tablet Take 1 tablet by mouth 3 (three) times daily.   Yes [provider]  ipratropium (ATROVENT) 0.02 % nebulizer solution USE 1 VIAL BY NEBULIZATION 4 (FOUR) TIMES DAILY. 05/12/21  Yes Brand Males, MD  montelukast (SINGULAIR) 10 MG tablet TAKE 1 TABLET BY MOUTH EVERYDAY AT BEDTIME 07/02/21  Yes [name] Agreste, MD  Multiple Vitamins-Minerals (VITAMIN D3 COMPLETE PO) Take by mouth.   Yes [provider]  nitroGLYCERIN (NITROSTAT) 0.4 MG SL tablet PLACE 1 TABLET UNDER THE TONGUE EVERY 5 MINUTES AS NEEDED FOR CHEST PAIN 06/30/21  Yes Martinique, Peter M, MD  Omega-3 Fatty Acids (FISH OIL) 1000 MG CAPS Take by mouth daily.   Yes [provider]  ondansetron (ZOFRAN-ODT) 8 MG disintegrating tablet Take 1 tablet (8 mg total) by mouth every 8 (eight) hours as needed for nausea or vomiting. 11/13/20  Yes [name] Agreste, MD  oxybutynin (DITROPAN-XL) 10 MG 24 hr tablet Take 1 tablet (10 mg total) by mouth daily. 07/02/21  Yes [name] Agreste, MD  OXYGEN Inhale 1.5-2 L into the lungs as needed (for shortness of breath).   Yes [provider]  pantoprazole (PROTONIX) 40 MG tablet TAKE 1 TABLET BY MOUTH EVERY DAY 05/26/21  Yes [name] Agreste, MD  polyethylene glycol [facility] / GLYCOLAX) packet Take 17 g by mouth daily as needed for mild constipation.    Yes [provider]  spironolactone (ALDACTONE) 25 MG  tablet TAKE 1 TABLET BY MOUTH EVERY DAY 03/10/21  Yes Martinique, Peter M, MD  traZODone (DESYREL) 100 MG tablet Take one tab daily as needed for sleep  02/18/21  Yes Arfeen, Arlyce Harman, MD  triamcinolone cream (KENALOG) 0.1 % SMARTSIG:1 Sparingly Topical Twice Daily PRN 04/18/20  Yes [provider]  warfarin (COUMADIN) 5 MG tablet TAKE 1/2 TO 1 TABLET BY MOUTH EVERY DAY OR AS DIRECTED BY COUMADIN CLINIC 06/09/21  Yes Martinique, Peter M, MD   Social History   Socioeconomic History   Marital status: Divorced    Spouse name: Not on file   Number of children: 2   Years of education: Not on file   Highest education level: Not on file  Occupational History   Occupation: disability  Tobacco Use   Smoking status: Former    Packs/day: 3.00    Years: 30.00    Pack years: 90.00    Types: Cigarettes    Quit date: 06/02/2010    Years since quitting: 11.1   Smokeless tobacco: Never   Tobacco comments:    QUIT IN 2011  Vaping Use   Vaping Use: Never used  Substance and Sexual Activity   Alcohol use: No    Alcohol/week: 0.0 standard drinks   Drug use: No   Sexual activity: Yes    Partners: Male    Birth control/protection: Post-menopausal, Surgical  Other Topics Concern   Not on file  Social History Narrative   Marital status: divorced in 1978 after ten years; dating casually in 2019.      Children: 3 biological children; 3 court appointed children; 6 grandchildren; 5 gg      Lives: alone; children in [place] and [place].      Employment: retired age 45; disability for COPD, CVA at age 3.      Tobacco: former smoker; quit smoking 2011.      Alcohol: none      Exercise: walks dog four times per day; goes to pool three times per week.      ADLs: drives; no assistant devices; does have a walker.  Cleaning is limited in 2018.  Daughter helps with cleaning.  Does own grocery shopping.      Advanced Directives: YES; DNR/DNI; HCPOA: Rolan Lipa Martin/daughter youngest.  Blind.               Social Determinants of Health   Financial Resource Strain: Not on file  Food Insecurity: Not on file  Transportation Needs: Not on file   Physical Activity: Not on file  Stress: Not on file  Social Connections: Not on file  Intimate Partner Violence: Not on file    Review of Systems Per HPI.   Objective:   Vitals:   07/16/21 1046  BP: 138/80  Pulse: 65  Resp: 16  Temp: 97.9 F (36.6 C)  TempSrc: Temporal  SpO2: 93%  Weight: 182 lb 9.6 oz (82.8 kg)  Height: '5\' 4"'$  (1.626 m)     Physical Exam Vitals reviewed.  Constitutional:      Appearance: Normal appearance. She is well-developed.  HENT:     Head: Normocephalic and atraumatic.  Eyes:     Conjunctiva/sclera: Conjunctivae normal.     Pupils: Pupils are equal, round, and reactive to light.  Neck:     Vascular: No carotid bruit.  Cardiovascular:     Rate and Rhythm: Normal rate and regular rhythm.     Heart sounds: Normal heart sounds.  Pulmonary:  Effort: Pulmonary effort is normal.     Breath sounds: Normal breath sounds.     Comments: Few diffuse coarse breath sounds but no respiratory distress, overall clear. Abdominal:     Palpations: Abdomen is soft. There is no pulsatile mass.     Tenderness: There is no abdominal tenderness.  Musculoskeletal:     Right lower leg: No edema.     Left lower leg: No edema.  Skin:    General: Skin is warm and dry.  Neurological:     Mental Status: She is alert and oriented to person, place, and time.  Psychiatric:        Mood and Affect: Mood normal.        Behavior: Behavior normal.       Assessment & Plan:  ADRYAN MARSHMAN is a 75 y.o. female . Discomfort of left ear - Plan: Ambulatory referral to ENT Decreased hearing, left - Plan: Ambulatory referral to ENT  -Persistent left ear symptoms, refer to ENT for further evaluation  Chronic systolic CHF (congestive heart failure) ([place]) - Plan: Pro b natriuretic peptide COPD, severe ([place]) - Plan: DG Chest 2 View Dyspnea, unspecified type - Plan: Pro b natriuretic peptide, DG Chest 2 View Cough - Plan: DG Chest 2 View  -Cough, dyspnea likely  related to COPD but with underlying CHF will check chest x-ray, BNP.  Recommended increasing her Atrovent up to 4 times per day, and if not improving in the next 48 hours consider repeat prednisone for COPD flare.  Keep follow-up with pulmonary as planned.  ER precautions given.  Hyponatremia - Plan: Basic metabolic panel  -Repeat labs.  No orders of the defined types were placed in this encounter.  Patient Instructions  I will refer you to ENT for the left ear symptoms.  I will recheck labs for the low sodium level on recent labs. No med changes for now.  I will check xray and heart failure test today to evaluate your shortness of breath.  Try increasing the atrovent to 3-4 times per day. If shortness of breath and cough is not better with higher dose atrovent in next 2 days, let me know and we can discuss more prednisone if needed.  Keep follow up with pulmonary next week. If any worsening symptoms be seen in ER or call 911.     Signed,   Merri Ray, MD [place] 07/16/21 11:24 AM

## 2021-07-17 ENCOUNTER — Encounter: Payer: Self-pay | Admitting: Family Medicine

## 2021-07-18 LAB — PRO B NATRIURETIC PEPTIDE: NT-Pro BNP: 363 pg/mL (ref 0–738)

## 2021-07-19 ENCOUNTER — Other Ambulatory Visit: Payer: Self-pay | Admitting: Family Medicine

## 2021-07-19 DIAGNOSIS — E871 Hypo-osmolality and hyponatremia: Secondary | ICD-10-CM

## 2021-07-19 DIAGNOSIS — J449 Chronic obstructive pulmonary disease, unspecified: Secondary | ICD-10-CM

## 2021-07-19 MED ORDER — CLARITHROMYCIN 500 MG PO TABS: 500.0000 mg | ORAL_TABLET | Freq: Two times a day (BID) | ORAL | 0 refills | Status: DC

## 2021-07-19 NOTE — Progress Notes (Signed)
See lab result notes.  With persistent discolored phlegm, COPD, will treat again with clarithromycin for possible infectious cause along with some of the findings on chest x-ray.  Held on prednisone for now as wheezing has improved with increased use of Atrovent.  Has follow-up with pulmonary in 3 days.  ER precautions given.  Advised to inform Coumadin clinic given interaction between clarithromycin and Coumadin.  She is aware.  Lab only visit for repeat sodium in the next 2 weeks.

## 2021-07-21 LAB — HM MAMMOGRAPHY

## 2021-07-22 ENCOUNTER — Encounter: Payer: Self-pay | Admitting: Internal Medicine

## 2021-07-22 ENCOUNTER — Ambulatory Visit (INDEPENDENT_AMBULATORY_CARE_PROVIDER_SITE_OTHER): Payer: Medicare Other | Admitting: Internal Medicine

## 2021-07-22 ENCOUNTER — Other Ambulatory Visit: Payer: Self-pay

## 2021-07-22 VITALS — BP 122/64 | HR 53 | Temp 98.1°F | Ht 64.0 in | Wt 184.0 lb

## 2021-07-22 DIAGNOSIS — J441 Chronic obstructive pulmonary disease with (acute) exacerbation: Secondary | ICD-10-CM | POA: Diagnosis not present

## 2021-07-22 DIAGNOSIS — Z7185 Encounter for immunization safety counseling: Secondary | ICD-10-CM | POA: Diagnosis not present

## 2021-07-22 DIAGNOSIS — J9611 Chronic respiratory failure with hypoxia: Secondary | ICD-10-CM

## 2021-07-22 DIAGNOSIS — J449 Chronic obstructive pulmonary disease, unspecified: Secondary | ICD-10-CM

## 2021-07-22 MED ORDER — PREDNISONE 10 MG PO TABS: ORAL_TABLET | ORAL | 0 refills | Status: AC

## 2021-07-22 MED ORDER — YUPELRI 175 MCG/3ML IN SOLN: 175.0000 ug | Freq: Every day | RESPIRATORY_TRACT | 0 refills | Status: DC

## 2021-07-22 MED ORDER — YUPELRI 175 MCG/3ML IN SOLN
175.0000 ug | Freq: Every day | RESPIRATORY_TRACT | 11 refills | Status: DC
Start: 2021-07-22 — End: 2021-12-08

## 2021-07-22 NOTE — Patient Instructions (Addendum)
COPD, severe ([place]) COPD frequent exacerbation with current exacerbation   -partially resolved but ongoing current exacerbation - too bad breztril caused thrush  - noted need for 2 night o2 concentrators at [facility] -Noted you are doing ipratropium nebulizer for maintenance but this does cause some tremors but you are managing it 2 times daily   Plan -list\ BREZTRI as allergy - change ipratropium nebulizer to YUPELRI nebulizer once daily  -Take 4-week sample  -Take prescription and price it out  -Call if any side effects - use 2.5L o2 at night and daytime continuous  -Get letter/prescription from [place] to get to concentrators at home 1 5 stairs and 1 for downstairs  -Continue [facility] portable system -Repeat chest x-ray in 4 weeks -Respect vaccine refusal - flu shot and covid - Please take prednisone 40 mg x1 day, then 30 mg x1 day, then 20 mg x1 day, then 10 mg x1 day, and then 5 mg x1 day and stop    Follow-up - 4 weeks with nurse practitioner but after chest x-ray; also will see if the YUPELRI is working well for you

## 2021-07-22 NOTE — Progress Notes (Signed)
OV 05/19/2016  Chief Complaint  Patient presents with   Follow-up    Pt c/o worsening SOB, prod cough with thick white mucus.      This is a routine follow-up. Last visit was in April 2017 with nurse practitioner. At that time treated for COPD exacerbation according to her history but review of the chart does not show that to be true. At this point in time she says COPD stable although she says she might be in flare up on account of her fibromyalgia. Starting her symptoms out it appears that it is generally stable with dyspnea at baseline and cough with mild sputum at baseline. She is more hobbled by her chronic pain and fibromyalgia. Couple months ago apparently a hydrocodone was discontinued by rheumatology and therefore she is having "withdrawal". She does have a new pain medication physician. She is on gabapentin for fibromyalgia. There are no other new issues. She does not want antibiotics or prednisone for her perceived exacerbation  OV 11/17/2016  Chief Complaint  Patient presents with   Follow-up    Pt states her SOB has worsened since last OV. Pt states she has a burning in her chest when she becomes SOB. Pt c/o prod cough with white mucus in morning, cough becomes nonprod throughout the day.     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 Last CT chest March 2017 without any mass and associated mild cor pulmonale   Six-month follow-up visit. She is completely overwhelmed by her fibromyalgia and depression. The fibromyalgia is worse. She says she is change doctors because of this. She's had a few admissions in the interim but none of them give a COPD exacerbation according to chart review. She suffered frustrated by her heels oxygen system even though it is light it is causing her pain. She uses Atrovent nebulizer but wants change to something else but at the same time has rejected use of any other  nebulizer or oral inhaler because of side effects. She is burning chest pain with inspiration and associated with her costochondral junction trigger points. 2014 review of the chart shows normal cardiac stress test. November 2017 chest x-ray is clear. She does not want any further imaging. Chest pain is mild to severe and variable. Worsened with inspiration. No radiation associated wheezing. No sputum production  OV 05/25/2017  Chief Complaint  Patient presents with   Follow-up    Pt states her SOB has worsened since last OV in 11/2016. Pt states she only coughs after her neb treatment - pt states her mucus is yellow in color and c/o occ chest discomfort. Pt denies f/c/s.     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 Last CT chest March 2017 without any mass and associated mild cor pulmonale   Follow-up exertional hypoxemia associated with his emphysema. Also associated with fibromyalgia. Last visit she had atypical chest pain. Recommended she see cardiology. 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 According to her history she has significant coronary artery disease but is fairly advanced. She is frustrated with this but realistic. She feels her days are number. In terms of COPD emphysema and is stable. She is on Atrovent inhalers. She is on oxygen. She wants a lighter system. She still burden by fibromyalgia. 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  She uses oxygen and nebulizers.  She is not interested in rehab or vaccines.  New issue: Preoperative pulmonary evaluation.  She is having significant bilateral lower extremity claudication.  She states that she is in severe pain walking from her door to the mailbox to the point she is almost crying.  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  Apparently Dr. Trula Slade wants to try a laparotomy approach.  However Dr. Broadus John might take her to the cardiac Cath Lab and placed stents.  She says she is sensitive to fentanyl and is worried about anesthesia complications but the pain is so severe she is willing to take the risk.  She has had previous cardiac catheterization without any problems other than being sensitive to fentanyl.  She says this can be done in the cardiac Cath Lab with anesthesia support.  She wants me to talk to Dr. Betsy Coder and Dr. Trula Slade about this.   OV 07/18/2018  Subjective:  Patient ID: Laurie Horton, female , DOB: [birth year] , age 75 y.o. , MRN: [id number] , ADDRESS: [address] [place] [address]   07/18/2018 -   Chief Complaint  Patient presents with   Follow-up    Pt states her chest is hurting her all the time now and states she does not think her neb solutions are working for her anymore now. Pt also states she has had some worsening SOB and is also coughing up white phlegm which is comes out in chunks.     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  She uses oxygen and nebulizers. Last CT chest March 2017 without any mass and associated mild cor pulmonale   HPI Laurie Horton 75 y.o. -after last visit she saw  nurse practitioner in June 2019 fora mild respiratory flare. At the time treated with allergy medications antihistamines.She tells me thathe last saw me January 2019 she had an iliac stent place in the left side and after that her effort tolerance is improved but in the last few months she's noticed a decrease in effort tolerance with worsening dyspnea and also increased cough and increased sputum production in volume and also consistency without change in color This no fever or weight loss. She will not have a flu shot because of prior allergy. Her last CT scan of the chest was in 2017 and she is requesting for another one. Her inhaler as ipratropium which she says she's not happy with. In the past she's uses Spiriva and Symbicort and these have caused blisters and so she is generally where you have inhalers although she wants something other than her current one.       08/19/2018  - Visit   [name].  Patient was last seen in our office visit on  07/18/2018 started on Bevespi.  Patient reports that after 1 day of use of the Bevespi inhaler she developed thrush and mouth sores.  She she contacted our office to be treated with nystatin.  Patient reports that most mouth sores resolved there remains one.  Patient is also having extensive dental work done.  Patient also has been treated for scabies as well as impetigo by dermatology recently.   Patient completed a high-res CT in 07/29/2018 that showed no real changes from baseline.  Still showing severe emphysema.   Patient reports that she continues to use her Atrovent nebulizer but is wondering if there is any other options available for her.  Patient feels that the Atrovent nebulizer is not working as well.  Patient is currently using as needed and not using it scheduled.  MMRC - Breathlessness Score 3 - I stop for breath after walking about 100 yards or after a few minutes on level ground (isle at grocery  store is 159f)  Patient reports that she has known triggers of shortness of breath with exertion, when there is high pollen counts, when she is walking, when she is outside for extended periods of time.  Patient reports she is been using 2 L via nasal cannula of oxygen with exertion as well as at rest.  Patient reports she forgot her POC at home.  She arrived to our office on room air.  Oxygen saturations 87.  Patient refused oxygen in our office and states that she does not think she needs oxygen right now.  Patient is sad and concerned regarding her limitations with vascular surgery.  Patient believes that she needs a surgery but reports that vascular team as well as cardiology does not think that she would be a good surgical candidate.  She reports that she has not heard back from Dr. BNaida Sleightoffice regarding her most recent test results.   Tests:  01/21/2016-CT chest without contrast- moderate centrilobular emphysema and diffuse bronchial wall thickening mild subpleural density in the dependent lower lobes  01/20/2016-pulmonary function test- airway obstruction and diffusion defect suggesting emphysema  Imaging:  02/11/2017-chest x-ray-stable large cardiac silhouette, lungs are hyperinflated, interstitial edema pattern unchanged from prior scans   Cardiac:  07/08/2017-echocardiogram-LV ejection fraction 50 to 596% grade 1 diastolic dysfunction      OV 12/21/2018  Subjective:  Patient ID: KArmen Horton female , DOB: [birth year], age 75y.o. 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  She uses oxygen and nebulizers. Last CT chest March 2017 without any mass and associated mild cor pulmonale  12/21/2018 -   Chief Complaint  Patient presents with   Follow-up    Pt states due to being switched to a  new medication, she has had labored breathing. SOB is with exertion, has an occ cough with white phlegm, and also has had some occ CP.     HPI KKIRSTI MCALPINE89y.o. -  Presents for routine follow-up.  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  She uses oxygen sporadically and nebulizer sporadically.  She is very afraid of medicines because of multiple allergies.  She says her psychiatrist Dr. AMelissa Montaneplaced her on some medications that caused a rash.  Other than that she is okay.      OV  09/15/2019  Subjective:  Patient ID: Laurie Horton, female , DOB: 03/22/1946 , age 39 y.o. , MRN: [id number] , ADDRESS: [address] 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  She uses oxygen and nebulizers. Last CT chest March 2017 without any mass and associated mild cor pulmonale   09/15/2019 -  No chief complaint on file.    HPI ESTEE YOHE 75 y.o. -     ROS - per HPI     OV 01/17/2020  Subjective:  Patient ID: Laurie Horton, female , DOB: 09/12/1946 , age 45 y.o. , MRN: [id number] , ADDRESS: [address] [place] [address]   01/17/2020 -   Chief Complaint  Patient presents with   Follow-up     HPI KRISTEE ANGUS 75 y.o. -presents for face-to-face follow-up.  In December she called with a COPD flareup symptoms.  She wanted Biaxin despite her allergies.  She feels that Biaxin helps her.  She says she was given prednisone which helped but she was only given generic clarithromycin instead of the tradename Biaxin.  She says it did not work.  She wants tradename Biaxin only.  She says since then her cough is more than baseline.  She also is like sputum that is discolored.  She feels like she is in exacerbation and this is preventing her from getting the COVID-19  vaccine.  She feels another course of tradename Biaxin is required.  She again does not want generic clarithromycin.  She is compliant with her baseline nebulizer nighttime oxygen.  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  She is now recovering from that.  She talked about Covid vaccine.  She wants to get it.  He has had pneumonia vaccine without problem but recently flu shot she feels this put her in the hospital.  She also has multiple oral drug allergies.  Overall she continues to mask and follow social distancing.       CAT COPD Symptom & Quality of Life Score (GSK trademark) 0 is no burden. 5 is highest burden 07/18/2018  12/21/2018  01/17/2020   Never Cough -> Cough all the time '3 3 3  ' No phlegm in chest -> Chest is full of phlegm '5 3 4  ' No chest tightness -> Chest feels very tight '4 3 3  ' No dyspnea for 1 flight stairs/hill -> Very dyspneic for 1 flight of stairs '5 3 5  ' No limitations for ADL at home -> Very limited with ADL at home '5 4 3  ' Confident leaving home -> Not at all confident leaving home 0 1 2  Sleep soundly -> Do not sleep soundly because of lung condition '3 4 3  ' Lots of Energy -> No energy at all '5 4 4  ' TOTAL Score (max 40)  '30 25 27   ' No flowsheet data found.    12/19/2020 -   Chief Complaint  Patient presents with   Follow-up    Had Covid PNA 10/2020, doing some better     HPI ROBEN TATSCH 75 y.o. -presents for follow-up of her COPD.  She continues to use Atrovent nebulizer.  She prefers nebulizers of inhaler.  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  She uses oxygen at night with exertion.  She tells me that in December  2021 around Christmas she had respiratory viral symptoms.  She says rapid antigen test by her daughter was negative.  Then in January 2022 she followed up with primary care physician who did a chest x-ray that showed pneumonia.  I have the chest x-ray with me  visualized it.  There is an infiltrate.  She says she was told that she had Covid but there is no confirmatory evidence for this.  She treated herself at home.  She cannot have vaccines because of multiple drug allergies according to history.  I supported her in the decision making.  She is worried about abnormal chest x-ray.        CAT Score 12/19/2020  Total CAT Score 18       OV 01/30/2021  Subjective:  Patient ID: Laurie Horton, female , DOB: 09/19/1946 , age 28 y.o. , MRN: [id number] , ADDRESS: [address] [place] [address] PCP [name] Agreste, MD Patient Care Team: [name] Agreste, MD as PCP - General (Family Medicine) Lorretta Harp, MD as PCP - Cardiology (Cardiology) Margaretha Sheffield, MD as Referring Physician (Physical Medicine and Rehabilitation) Laurence Spates, MD (Inactive) as Consulting Physician (Gastroenterology) Garvin Fila, MD as Consulting Physician (Neurology) Martinique, Peter M, MD as Consulting Physician (Cardiology) Brand Males, MD as Consulting Physician (Pulmonary Disease) Adele Schilder Arlyce Harman, MD as Consulting Physician (Psychiatry) Lorretta Harp, MD as Consulting Physician (Peripheral Vascular Disease)  This Provider for this visit: Treatment Team:  Attending Provider: Brand Males, MD    01/30/2021 -   Chief Complaint  Patient presents with   Follow-up    Doing ok, breathing is the same     HPI Laurie Horton 75 y.o. -presents for follow-up for COPD.  Here to review the results.  Her daughter Andriea Hasegawa is on the phone.  No new interim complaints.  A Covid IgG is negative thus making her recent viral infection is unlikely as Covid.  I offered a referral for Covid monoclonal antibody prophylaxis but she declined.  Her pulmonary function test shows 12% decline in FEV1 in 5 years.  I reviewed the chart and do not find an alpha-1 check.  I presume this was done prior to [place] using electronic medical records.  She  is open to getting it tested again.  She is to go since 2 COPD.  She tells me that the DME company is out of supply with tubing for her nebulizer.  She has used an inhaler but she is somewhat skeptical because of nasal septal perforation.  Explained to her inhalers oral.  She then talked about dentures.  Explained that we can do inhalers through an [facility].  She had high-resolution CT chest.  This shows emphysema.  There is evidence of cor pulmonale.  There is stable thoracic aorta aneurysm.  No fibrosis no cancer.   CAT Score 01/30/2021 12/19/2020  Total CAT Score 10 18        Ref Range & Units 1 mo ago  SARS COV1 AB(IGG)SPIKE,SEMI QN <1.00 index <1.00         IMPRESSION: 1. No evidence of interstitial lung disease. Air trapping is indicative of small airways disease. 2. Ascending aortic aneurysm, stable. Recommend annual imaging followup by CTA or MRA. This recommendation follows 2010 ACCF/AHA/AATS/ACR/ASA/SCA/SCAI/SIR/STS/SVM Guidelines for the Diagnosis and Management of Patients with Thoracic Aortic Disease. Circulation. 2010; 121: H909-P112. Aortic aneurysm NOS (ICD10-I71.9). 3. Aortic atherosclerosis (ICD10-I70.0). Coronary artery calcification. 4. Enlarged pulmonic trunk, indicative of pulmonary  arterial hypertension. 5.  Emphysema (ICD10-J43.9).     Electronically Signed   By: Lorin Picket M.D.   On: 01/15/2021 11:52     OV 07/22/2021  Subjective:  Patient ID: Laurie Horton, female , DOB: [birth year] , age 75 y.o. , MRN: [id number] , ADDRESS: [address] [place] [address] PCP [name] Agreste, MD Patient Care Team: [name] Agreste, MD as PCP - General (Family Medicine) Martinique, Peter M, MD as PCP - Cardiology (Cardiology) Margaretha Sheffield, MD as Referring Physician (Physical Medicine and Rehabilitation) Laurence Spates, MD (Inactive) as Consulting Physician (Gastroenterology) Garvin Fila, MD as Consulting Physician  (Neurology) Martinique, Peter M, MD as Consulting Physician (Cardiology) Brand Males, MD as Consulting Physician (Pulmonary Disease) Adele Schilder Arlyce Harman, MD as Consulting Physician (Psychiatry)  This Provider for this visit: Treatment Team:  Attending Provider: Brand Males, MD    07/22/2021 -   Chief Complaint  Patient presents with   Follow-up    Pt states she had pneumonia since last visit. States she went to see PCP after finishing meds and was told she still had infection and was placed back on abx by PCP.   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  She uses oxygen and nebulizer  HPI DONEEN OLLINGER 75 y.o. -returns for follow-up.  On 06/18/2021 she called with COPD exacerbation symptoms.  Gave her Biaxin and prednisone.  But she tells me that because the line was busy our office did not call in the medications till 5 days later when they got hold of her.  She says she is only partially improved and she is her primary care physician.  She had chest x-ray 07/16/2021.  This reports of interstitial prominence.  However there is no mass or consolidation.  She had basic labs that I reviewed and it is normal.  Apparently primary care physician gave her another Biaxin.  But there is no prednisone.  She still has yellow symptoms.  Symptoms only partially resolved.  She feels another round of prednisone will help her.  Of note we put her on triple inhaler BREZTRI last visit but this gave her thrush.  She is taking ipratropium.  If she only takes it 3 times a day when she has tremors that she is managing just with 2 times a day and this is insufficient.  She is using oxygen continuous now up to 2-1/2 L.  She is now moved downstairs because of her shortness of breath.  She is wanting oxygen concentrator upstairs and downstairs.  In the last few to several years she is refused flu shot.   She will not have the COVID-vaccine either.    CT Chest data  No results found.    PFT  PFT Results Latest Ref Rng & Units 01/30/2021 01/20/2016 08/14/2013  FVC-Pre L 2.61 2.79 -  FVC-Predicted Pre % 92 93 100  FVC-Post L - - 3.07  FVC-Predicted Post % - - 103  Pre FEV1/FVC % % 63 67 62  Post FEV1/FCV % % - - 64  FEV1-Pre L 1.63 1.86 1.84  FEV1-Predicted Pre % 77 82 82  FEV1-Post L - - 1.97  DLCO uncorrected ml/min/mmHg 9.45 9.75 11.57  DLCO UNC% % 49 40 50  DLCO corrected ml/min/mmHg 9.45 - -  DLCO COR %Predicted % 49 - -  DLVA Predicted % 57 51 58  TLC L 4.59 4.62 4.32  TLC %  Predicted % 90 91 88  RV % Predicted % 80 78 77       has a past medical history of Cataract, CHF (congestive heart failure) (HCC) (78/93/8101), Complication of anesthesia, COPD (chronic obstructive pulmonary disease) ([facility] [place]), Depression, Depression, Diverticulitis, DVT (deep venous thrombosis) ([facility]), Fibromyalgia, GERD (gastroesophageal reflux disease), Heart murmur, Hiatal hernia, History of deviated nasal septum, HTN (hypertension), Hyperlipidemia, Hypertension, Hypertensive retinopathy, Myocardial infarction ([facility]), Obesity, On supplemental oxygen therapy, Osteoporosis, Osteoporosis, Oxygen deficiency, PAT (paroxysmal atrial tachycardia) ([facility]), PFO (patent foramen ovale), PULMONARY NODULE, LEFT LOWER LOBE (10/14/2009), PVD (peripheral vascular disease) with claudication ([facility]) (12/2017), Right middle lobe pneumonia (07/24/2011), Stroke ([facility]), and TOBACCO ABUSE (06/04/2009).   reports that she quit smoking about 11 years ago. Her smoking use included cigarettes. She has a 90.00 pack-year smoking history. She has never used smokeless tobacco.  Past Surgical History:  Procedure Laterality Date   ABDOMINAL HYSTERECTOMY N/A    [facility]rees[employer] 11/04/2020   CARDIOVASCULAR STRESS TEST  12/26/2004   EF 74%. NO EVIDENCE OF ISCHEMIA   CATARACT EXTRACTION Left    Dr. Elliot Dally   ESOPHAGOGASTRODUODENOSCOPY (EGD) WITH  PROPOFOL N/A 04/15/2015   Procedure: ESOPHAGOGASTRODUODENOSCOPY (EGD) WITH PROPOFOL;  Surgeon: Laurence Spates, MD;  Location: [facility];  Service: Endoscopy;  Laterality: N/A;   EYE SURGERY Left    Cat Sx   JOINT REPLACEMENT N/A    [employer]reesia 11/04/2020   KNEE ARTHROSCOPY  2000   left   LAPAROSCOPIC CHOLECYSTECTOMY  04-16-2010   [facility]   LOWER EXTREMITY ANGIOGRAPHY N/A 09/09/2017   Procedure: Lower Extremity Angiography;  Surgeon: Lorretta Harp, MD;  Location: [facility];  Service: Cardiovascular;  Laterality: N/A;   LOWER EXTREMITY INTERVENTION Left 01/17/2018   Procedure: LOWER EXTREMITY INTERVENTION;  Surgeon: Lorretta Harp, MD;  Location: [facility];  Service: Cardiovascular;  Laterality: Left;   MOUTH SURGERY     03-26-15 multiple extractions stitches remains   PERIPHERAL VASCULAR INTERVENTION Left 01/17/2018   Procedure: PERIPHERAL VASCULAR INTERVENTION;  Surgeon: Lorretta Harp, MD;  Location: [facility];  Service: Cardiovascular;  Laterality: Left;  COMMON ILIAC   RIGHT/LEFT HEART CATH AND CORONARY ANGIOGRAPHY N/A 03/04/2017   Procedure: Right/Left Heart Cath and Coronary Angiography;  Surgeon: Peter M Martinique, MD;  Location: [facility];  Service: Cardiovascular;  Laterality: N/A;   TOTAL ABDOMINAL HYSTERECTOMY     post op needed oxygen was told "she gave them a scare"   TUBAL LIGATION     US ECHOCARDIOGRAPHY  11/20/2009   EF 55-60%    Allergies  Allergen Reactions   Alprazolam Anaphylaxis and Other (See Comments)    REACTION: stops breathing   Bee Venom Anaphylaxis   Iodine Anaphylaxis, Swelling and Other (See Comments)    REACTION: swelling in throat   Pseudoephedrine H[facility] Er Shortness Of Breath   Budesonide-Formoterol Fumarate Other (See Comments)    Blisters inside of mouth all over   Crestor [Rosuvastatin Calcium] Other (See Comments)    Unable to walk   Esomeprazole Magnesium Other (See Comments)    REACTION: "bouncing off walls"    Flonase [Fluticasone Propionate] Other (See Comments)    NOSE BLEED   Lamictal [Lamotrigine] Rash    Patient got rash, labored breathing, and diarrhea   Loratadine Other (See Comments)    claritin D causes shaking   Lotrimin [Clotrimazole] Other (See Comments)    Mouth blisters   Lunesta [Eszopiclone] Other (See Comments)    REACTION: "slept for  a week"   Oxcarbazepine Other (See Comments)    Causes deep sleep and dizziness   Statins Other (See Comments)    Can't walk, legs won't work    Zolpidem Tartrate Other (See Comments)    REACTION: "slept for a week"   Betadine [Povidone Iodine] Other (See Comments)    Breathing problems   Bevespi Aerosphere [Glycopyrrolate-Formoterol] Other (See Comments)    Pt believes this caused mouth sores and thrush    Clarithromycin Other (See Comments)    All "mycins", Puts into "a" fib, Will take if has to for severe sinus infection   Effexor [Venlafaxine] Nausea And Vomiting and Other (See Comments)    cramps   Lexapro [Escitalopram Oxalate] Other (See Comments)    hallucinations   Aciphex [Rabeprazole Sodium] Rash   Alendronate Sodium Other (See Comments)    "caused stomach problems for 3 days"   Avelox [Moxifloxacin Hcl In Nacl] Other (See Comments)    Stomach cramps.    Bextra [Valdecoxib] Rash   Ceclor [Cefaclor] Rash   Cephalexin Rash and Other (See Comments)    Pt states that she is possibly allergic to this - had a reaction to Cefaclor in the past and she does not want to these class drugs. Added per patient request.   Covera-Hs [Verapamil Hcl] Palpitations   Dicyclomine Hcl Rash   Other Other (See Comments)    Glue from ekg/heart monitor leads --rash, Any MYCINS   Tessalon Perles Rash    Immunization History  Administered Date(s) Administered   Influenza Split 08/03/2011, 08/01/2012   Influenza,inj,Quad PF,6+ Mos 08/14/2013, 09/03/2014, 09/04/2015, 08/18/2016   Influenza-Unspecified 11/03/1999   Pneumococcal Conjugate-13  10/01/2014   Pneumococcal Polysaccharide-23 09/03/1999, 06/02/2006, 08/14/2013   Td 11/02/1994   Tdap 08/18/2016    Family History  Problem Relation Age of Onset   Dementia Mother    Diabetes Mother    Alzheimer's disease Mother    Heart attack Brother 88   Heart attack Father    Schizophrenia Sister    Diabetes Sister    Tremor Sister      Current Outpatient Medications:    Alirocumab (PRALUENT) 75 MG/ML SOAJ, Inject 75 mg into the skin every 14 (fourteen) days., Disp: 2 mL, Rfl: 11   carvedilol (COREG) 12.5 MG tablet, TAKE 1 TABLET BY MOUTH TWICE A DAY, Disp: 180 tablet, Rfl: 3   clarithromycin (BIAXIN) 500 MG tablet, Take 1 tablet (500 mg total) by mouth 2 (two) times daily., Disp: 14 tablet, Rfl: 0   clopidogrel (PLAVIX) 75 MG tablet, TAKE 1 TABLET BY MOUTH EVERY DAY, Disp: 30 tablet, Rfl: 11   desvenlafaxine (PRISTIQ) 50 MG 24 hr tablet, Take 1 tablet (50 mg total) by mouth daily., Disp: 30 tablet, Rfl: 2   diclofenac sodium (VOLTAREN) 1 % GEL, APPLY 2 GRAMS TO BOTH HANDS 4 TIMES A DAY, Disp: , Rfl: 5   Eyelid Cleansers (OCUSOFT BABY EYELID & EYELASH EX), Apply topically., Disp: , Rfl:    furosemide (LASIX) 40 MG tablet, TAKE 1 TABLET BY MOUTH EVERY DAY, Disp: 90 tablet, Rfl: 3   gabapentin (NEURONTIN) 100 MG capsule, Take 200 mg by mouth See admin instructions. Take 1 in am and 1 in the lunch and 2 at bedtime, Disp: , Rfl:    HYDROcodone-acetaminophen (NORCO/VICODIN) 5-325 MG tablet, Take 1 tablet by mouth 3 (three) times daily., Disp: , Rfl:    ipratropium (ATROVENT) 0.02 % nebulizer solution, USE 1 VIAL BY NEBULIZATION 4 (FOUR) TIMES DAILY., Disp: 62.5  mL, Rfl: 3   montelukast (SINGULAIR) 10 MG tablet, TAKE 1 TABLET BY MOUTH EVERYDAY AT BEDTIME, Disp: 90 tablet, Rfl: 2   Multiple Vitamins-Minerals (VITAMIN D3 COMPLETE PO), Take by mouth., Disp: , Rfl:    nitroGLYCERIN (NITROSTAT) 0.4 MG SL tablet, PLACE 1 TABLET UNDER THE TONGUE EVERY 5 MINUTES AS NEEDED FOR CHEST PAIN, Disp:  25 tablet, Rfl: 3   Omega-3 Fatty Acids (FISH OIL) 1000 MG CAPS, Take by mouth daily., Disp: , Rfl:    ondansetron (ZOFRAN-ODT) 8 MG disintegrating tablet, Take 1 tablet (8 mg total) by mouth every 8 (eight) hours as needed for nausea or vomiting., Disp: 20 tablet, Rfl: 0   oxybutynin (DITROPAN-XL) 10 MG 24 hr tablet, Take 1 tablet (10 mg total) by mouth daily., Disp: 90 tablet, Rfl: 2   OXYGEN, Inhale 1.5-2 L into the lungs as needed (for shortness of breath)., Disp: , Rfl:    pantoprazole (PROTONIX) 40 MG tablet, TAKE 1 TABLET BY MOUTH EVERY DAY, Disp: 90 tablet, Rfl: 1   polyethylene glycol (MIRALAX / GLYCOLAX) packet, Take 17 g by mouth daily as needed for mild constipation. , Disp: , Rfl:    spironolactone (ALDACTONE) 25 MG tablet, TAKE 1 TABLET BY MOUTH EVERY DAY, Disp: 90 tablet, Rfl: 3   traZODone (DESYREL) 100 MG tablet, Take one tab daily as needed for sleep, Disp: 30 tablet, Rfl: 2   triamcinolone cream (KENALOG) 0.1 %, SMARTSIG:1 Sparingly Topical Twice Daily PRN, Disp: , Rfl:    warfarin (COUMADIN) 5 MG tablet, TAKE 1/2 TO 1 TABLET BY MOUTH EVERY DAY OR AS DIRECTED BY COUMADIN CLINIC, Disp: 30 tablet, Rfl: 5      Objective:   Vitals:   07/22/21 0853  BP: 122/64  Pulse: (!) 53  Temp: 98.1 F (36.7 C)  TempSrc: Oral  SpO2: 94%  Weight: 184 lb (83.5 kg)  Height: '5\' 4"'  (1.626 m)    Estimated body mass index is 31.58 kg/m as calculated from the following:   Height as of this encounter: '5\' 4"'  (1.626 m).   Weight as of this encounter: 184 lb (83.5 kg).  '@WEIGHTCHANGE' @  [employer]   07/22/21 0853  Weight: 184 lb (83.5 kg)     Physical Exam   General: No distress. Look swell. O2 on 2L Neuro: Alert and Oriented x 3. GCS 15. Speech normal Psych: Pleasant Resp:  Barrel Chest - no.  Wheeze - no, Crackles - no, No overt respiratory distress CVS: Normal heart sounds. Murmurs - no Ext: Stigmata of Connective Tissue Disease - no HEENT: Normal upper airway. PEERL +. No  post nasal drip        Assessment:       ICD-10-CM   1. 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 COPD with acute exacerbation ([place])  J44.1     3. Chronic respiratory failure with hypoxia ([facility])  J96.11     4. Vaccine counseling  Z71.85          Plan:     Patient Instructions  COPD, severe ([place]) COPD frequent exacerbation with current exacerbation   -partially resolved but ongoing current exacerbation - too bad breztril caused thrush  - noted need for 2 night o2 concentrators at [facility] -Noted you are doing ipratropium nebulizer for maintenance but this does cause some tremors but you are managing it 2 times daily   Plan -list\ BREZTRI as allergy - change ipratropium nebulizer to YUPELRI nebulizer once daily  -Take 4-week sample  -  Take prescription and price it out  -Call if any side effects - use 2.5L o2 at night and daytime continuous  -Get letter/prescription from [place] to get to concentrators at home 1 5 stairs and 1 for downstairs  -Continue [facility] portable system -Repeat chest x-ray in 4 weeks -Respect vaccine refusal - flu shot and covid   Follow-up - 4 weeks with nurse practitioner but after chest x-ray; also will see if the YUPELRI is working well for you    SIGNATURE    Dr. Brand Males, M.D., F.C.C.P,  Pulmonary and Critical Care Medicine Staff Physician, [place] Director - Interstitial Lung Disease  Program  [facility] [place], [place], [address]  Pager: [phone], If no answer or between  15:00h - 7:00h: call [phone] Telephone: [phone]  9:14 AM 07/22/2021

## 2021-07-23 ENCOUNTER — Telehealth: Payer: Self-pay

## 2021-07-23 NOTE — Telephone Encounter (Signed)
Warfarin interacts with both prednisone and clarithromycin. She is already scheduled for INR check tomorrow, ok to keep that date.

## 2021-07-23 NOTE — Telephone Encounter (Signed)
For the past week the pt has been on Clarithromycin 500 bid. Yesterday she was started on prednisone 10mg  5 day taper and yupelri. Pt was calling in to see if these will affect coumadin and if she needs to move up appt routing to pharmd pool.

## 2021-07-23 NOTE — Telephone Encounter (Signed)
LMOMED THE PT THAT IT DOES INTERACT BUT SAFE TO KEEP THAT DATE

## 2021-07-24 ENCOUNTER — Ambulatory Visit (INDEPENDENT_AMBULATORY_CARE_PROVIDER_SITE_OTHER): Payer: Medicare Other | Admitting: *Deleted

## 2021-07-24 ENCOUNTER — Other Ambulatory Visit: Payer: Self-pay

## 2021-07-24 DIAGNOSIS — Z5181 Encounter for therapeutic drug level monitoring: Secondary | ICD-10-CM

## 2021-07-24 DIAGNOSIS — I480 Paroxysmal atrial fibrillation: Secondary | ICD-10-CM | POA: Diagnosis not present

## 2021-07-24 DIAGNOSIS — Z7901 Long term (current) use of anticoagulants: Secondary | ICD-10-CM | POA: Diagnosis not present

## 2021-07-24 LAB — POCT INR: INR: 2.3 (ref 2.0–3.0)

## 2021-07-24 NOTE — Patient Instructions (Signed)
Description   Continue taking 1/2 a tablet daily, except 1 tablet on Wednesday repeat INR in 1 week. [facility] [phone]

## 2021-07-25 ENCOUNTER — Telehealth: Payer: Self-pay | Admitting: Family Medicine

## 2021-07-25 NOTE — Telephone Encounter (Signed)
Call patient.  Recent bone density testing indicated persistent osteoporosis although slight increase in bone density of the left hip.  I see that she is allergic to alendronate, has she been on other medication?  Has this been discussed with endocrinology or rheumatology?  Could certainly discuss other options or I can refer her to one of the specialists to discuss other options if that is okay with her.  Let me know.

## 2021-07-25 NOTE — Telephone Encounter (Signed)
Received the following message from patient:   "Reporting on Yupelri.  This is Zaelynn Fuchs First day of new med caused extreme dizziness.  Stayed in all day was afraid to drive Second day:  coughing has returned with a vengence Third day: light head but able to keep appointments Fourth:  Light head but able to keep appointments.  Cough is rough.   Postive results is that I can BREATHE  yeh  Sincerely, Darlyn Repsher 01/09/1946"  Will send to MR as a FYI.

## 2021-07-25 NOTE — Telephone Encounter (Signed)
If Laurie Horton is causing significant inconvenience then stop it and just go back to ipratropium 2 times daily

## 2021-07-25 NOTE — Telephone Encounter (Signed)
Called and left message on patient vm to return call about bone density results. I informed her on the message that I will be out of the office at 4:30 and I will try and reach her on Monday.

## 2021-07-28 ENCOUNTER — Other Ambulatory Visit (HOSPITAL_COMMUNITY): Payer: Self-pay | Admitting: Psychiatry

## 2021-07-28 ENCOUNTER — Other Ambulatory Visit: Payer: Self-pay | Admitting: Cardiology

## 2021-07-28 DIAGNOSIS — F331 Major depressive disorder, recurrent, moderate: Secondary | ICD-10-CM

## 2021-07-31 ENCOUNTER — Other Ambulatory Visit: Payer: Self-pay

## 2021-07-31 ENCOUNTER — Ambulatory Visit (INDEPENDENT_AMBULATORY_CARE_PROVIDER_SITE_OTHER): Payer: Medicare Other | Admitting: *Deleted

## 2021-07-31 DIAGNOSIS — I480 Paroxysmal atrial fibrillation: Secondary | ICD-10-CM | POA: Diagnosis not present

## 2021-07-31 DIAGNOSIS — Z5181 Encounter for therapeutic drug level monitoring: Secondary | ICD-10-CM

## 2021-07-31 DIAGNOSIS — Z7901 Long term (current) use of anticoagulants: Secondary | ICD-10-CM | POA: Diagnosis not present

## 2021-07-31 LAB — POCT INR: INR: 2 (ref 2.0–3.0)

## 2021-07-31 NOTE — Patient Instructions (Signed)
Description   Take 1 tablet of warfarin today and then continue taking 1/2 a tablet daily, except 1 tablet on Wednesday. Recheck INR in 3 weeks. [facility] [phone]

## 2021-07-31 NOTE — Telephone Encounter (Signed)
Hello Dr. [name], please see mychart message below, thanks!  I have decided to stay on the medications because the good out weighs the side effects.  I am not doing it every day but every other day because I can breathe.  I just plan my day around when I take the dosage.  Thank you

## 2021-08-06 ENCOUNTER — Other Ambulatory Visit (HOSPITAL_COMMUNITY): Payer: Self-pay | Admitting: *Deleted

## 2021-08-06 DIAGNOSIS — F331 Major depressive disorder, recurrent, moderate: Secondary | ICD-10-CM

## 2021-08-06 MED ORDER — TRAZODONE HCL 100 MG PO TABS: ORAL_TABLET | ORAL | 0 refills | Status: DC

## 2021-08-07 ENCOUNTER — Telehealth: Payer: Self-pay | Admitting: Internal Medicine

## 2021-08-07 NOTE — Telephone Encounter (Signed)
There was a message 7d ago from [facility] about patient Laurie Horton- patient said she deicded to stick with it. I could not reply indexing to that encounter in epic. So, please ask patient how she is tolerating the yupelri now

## 2021-08-07 NOTE — Telephone Encounter (Signed)
LMTCB

## 2021-08-12 ENCOUNTER — Other Ambulatory Visit: Payer: Self-pay | Admitting: Family Medicine

## 2021-08-12 ENCOUNTER — Telehealth: Payer: Self-pay

## 2021-08-12 DIAGNOSIS — Z1211 Encounter for screening for malignant neoplasm of colon: Secondary | ICD-10-CM

## 2021-08-12 NOTE — Progress Notes (Signed)
Cologuard ordered

## 2021-08-12 NOTE — Telephone Encounter (Signed)
Returned call to patient.  Assured her she was fine to take cephalexin, no interaction with warfarin.

## 2021-08-12 NOTE — Telephone Encounter (Signed)
Pt stated that they were placed on keflex 500mg  bid and mucipron ointment 2 percent. They are on warfarin routing to pharmd pool to please advise.

## 2021-08-13 ENCOUNTER — Ambulatory Visit (INDEPENDENT_AMBULATORY_CARE_PROVIDER_SITE_OTHER): Payer: Medicare Other | Admitting: Otolaryngology

## 2021-08-18 ENCOUNTER — Encounter (HOSPITAL_COMMUNITY): Payer: Self-pay | Admitting: Psychiatry

## 2021-08-18 ENCOUNTER — Emergency Department (HOSPITAL_COMMUNITY)
Admission: EM | Admit: 2021-08-18 | Discharge: 2021-08-18 | Disposition: A | Discharge status: Home / Self Care | Payer: Medicare Other | Attending: Physician Assistant | Admitting: Physician Assistant

## 2021-08-18 ENCOUNTER — Other Ambulatory Visit: Payer: Self-pay

## 2021-08-18 ENCOUNTER — Telehealth (HOSPITAL_BASED_OUTPATIENT_CLINIC_OR_DEPARTMENT_OTHER): Payer: Medicare Other | Admitting: Psychiatry

## 2021-08-18 DIAGNOSIS — F411 Generalized anxiety disorder: Secondary | ICD-10-CM

## 2021-08-18 DIAGNOSIS — L03116 Cellulitis of left lower limb: Secondary | ICD-10-CM | POA: Insufficient documentation

## 2021-08-18 DIAGNOSIS — F331 Major depressive disorder, recurrent, moderate: Secondary | ICD-10-CM | POA: Diagnosis not present

## 2021-08-18 DIAGNOSIS — Z5321 Procedure and treatment not carried out due to patient leaving prior to being seen by health care provider: Secondary | ICD-10-CM | POA: Insufficient documentation

## 2021-08-18 LAB — CBC WITH DIFFERENTIAL/PLATELET
Abs Immature Granulocytes: 0.01 10*3/uL (ref 0.00–0.07)
Basophils Absolute: 0 10*3/uL (ref 0.0–0.1)
Basophils Relative: 0 %
Eosinophils Absolute: 0.3 10*3/uL (ref 0.0–0.5)
Eosinophils Relative: 4 %
HCT: 43.4 % (ref 36.0–46.0)
Hemoglobin: 14 g/dL (ref 12.0–15.0)
Immature Granulocytes: 0 %
Lymphocytes Relative: 18 %
Lymphs Abs: 1 10*3/uL (ref 0.7–4.0)
MCH: 30.6 pg (ref 26.0–34.0)
MCHC: 32.3 g/dL (ref 30.0–36.0)
MCV: 95 fL (ref 80.0–100.0)
Monocytes Absolute: 0.6 10*3/uL (ref 0.1–1.0)
Monocytes Relative: 11 %
Neutro Abs: 3.9 10*3/uL (ref 1.7–7.7)
Neutrophils Relative %: 67 %
Platelets: 235 10*3/uL (ref 150–400)
RBC: 4.57 MIL/uL (ref 3.87–5.11)
RDW: 13.6 % (ref 11.5–15.5)
WBC: 5.9 10*3/uL (ref 4.0–10.5)
nRBC: 0 % (ref 0.0–0.2)

## 2021-08-18 LAB — BASIC METABOLIC PANEL
Anion gap: 9 (ref 5–15)
BUN: 8 mg/dL (ref 8–23)
CO2: 33 mmol/L — ABNORMAL HIGH (ref 22–32)
Calcium: 9.8 mg/dL (ref 8.9–10.3)
Chloride: 95 mmol/L — ABNORMAL LOW (ref 98–111)
Creatinine, Ser: 1.02 mg/dL — ABNORMAL HIGH (ref 0.44–1.00)
GFR, Estimated: 57 mL/min — ABNORMAL LOW (ref >60)
Glucose, Bld: 158 mg/dL — ABNORMAL HIGH (ref 70–99)
Potassium: 3.6 mmol/L (ref 3.5–5.1)
Sodium: 137 mmol/L (ref 135–145)

## 2021-08-18 MED ORDER — TRAZODONE HCL 100 MG PO TABS: ORAL_TABLET | ORAL | 2 refills | Status: DC

## 2021-08-18 MED ORDER — DESVENLAFAXINE SUCCINATE ER 50 MG PO TB24: 50.0000 mg | ORAL_TABLET | Freq: Every day | ORAL | 2 refills | Status: DC

## 2021-08-18 NOTE — ED Notes (Signed)
Pt called multiple no answer  

## 2021-08-18 NOTE — Progress Notes (Signed)
Virtual Visit via Telephone Note  I connected with Laurie Horton on 08/18/21 at  8:20 AM EDT by telephone and verified that I am speaking with the correct person using two identifiers.  Location: Patient: Home Provider: Home Office   I discussed the limitations, risks, security and privacy concerns of performing an evaluation and management service by telephone and the availability of in person appointments. I also discussed with the patient that there may be a patient responsible charge related to this service. The patient expressed understanding and agreed to proceed.   History of Present Illness: Patient is evaluated by phone session.  She missed last appointment because she was sick and having a flareup of her her breathing issues and required treatment.  Overall she feels the trazodone and Pristiq are working well.  She still have anxiety and nervousness but stable.  She did attended her grandson's abiding and she met there her ex and his wife.  Patient told that was the last time she saw her ex because he died in 08-09-23.  Overall she feels things are okay other than she does still have chronic health issues and lately she had infection in her leg.  Patient told she had skin biopsy to rule out cancer and the results are still pending but it got infected and now she is taking antibiotics.  Her family situation remains the same.  Her cat is now 47 year old and having a lot of health issues.  She had 2 new kittens.  She worried about her older daughter who is now have to move because landlord decided to sell the house.  She is taking hydrocodone, gabapentin for her chronic pain.  She is seeing Dr. Greta Doom for pain management.  She is looking for a new PCP because her PCP Dr. Nyoka Cowden is far from where she lives.  She is still very concerned and anxious about driving.  She denies any major panic attack but is still anxious and nervous about everything including finances, living situation, family  interaction.  She is sleeping good.  Her appetite is okay.  Her weight is stable.  Recently she had a blood work and her hemoglobin A1c remained stable to 6.4.  Her BUN 12 and creatinine 1.0.  Her sodium is 133 and her GFR 55.  Patient denies any tremors, shakes or any EPS.  She does drive but usually tried to pick up the time when there is not traffic and she avoids crowded places.  She like to keep the current medication.  Past Psychiatric History:  H/O depression and anxiety. No H/O inpatient treatment, suicidal attempt, paranoia, mania and hallucination. Seeing psychiatrist in 2006 after a stroke.  Tried Cymbalta, lexapro and Celexa in the past with limited response.  Tried Ambien, Lunesta, lexapro, Lamictal and Xanax but developed allergies and side effects.  Tried higher Pristiq but has side effects. Valium helped in past.  Recent Results (from the past 2160 hour(s))  POCT INR     Status: Abnormal   Collection Time: 06/04/21  7:35 AM  Result Value Ref Range   INR 1.7 (A) 2.0 - 3.0  POCT INR     Status: Abnormal   Collection Time: 06/20/21  7:50 AM  Result Value Ref Range   INR 1.9 (A) 2.0 - 3.0  HM DIABETES EYE EXAM     Status: None   Collection Time: 06/25/21 12:00 AM  Result Value Ref Range   HM Diabetic Eye Exam No Retinopathy No Retinopathy  HM MAMMOGRAPHY  Status: None   Collection Time: 06/26/21 12:00 AM  Result Value Ref Range   HM Mammogram 0-4 Bi-Rad 0-4 Bi-Rad, Self Reported Normal  POCT INR     Status: Abnormal   Collection Time: 06/30/21  7:30 AM  Result Value Ref Range   INR 3.3 (A) 2.0 - 3.0  Comprehensive metabolic panel     Status: Abnormal   Collection Time: 07/02/21 10:17 AM  Result Value Ref Range   Sodium 132 (L) 135 - 145 mEq/L   Potassium 4.0 3.5 - 5.1 mEq/L   Chloride 91 (L) 96 - 112 mEq/L   CO2 32 19 - 32 mEq/L   Glucose, Bld 105 (H) 70 - 99 mg/dL   BUN 13 6 - 23 mg/dL   Creatinine, Ser 0.99 0.40 - 1.20 mg/dL   Total Bilirubin 0.6 0.2 - 1.2 mg/dL    Alkaline Phosphatase 67 39 - 117 U/L   AST 16 0 - 37 U/L   ALT 13 0 - 35 U/L   Total Protein 6.7 6.0 - 8.3 g/dL   Albumin 4.4 3.5 - 5.2 g/dL   GFR 55.76 (L) >60.00 mL/min    Comment: Calculated using the CKD-EPI Creatinine Equation (2021)   Calcium 10.3 8.4 - 10.5 mg/dL  Lipid panel     Status: Abnormal   Collection Time: 07/02/21 10:17 AM  Result Value Ref Range   Cholesterol 132 0 - 200 mg/dL    Comment: ATP III Classification       Desirable:  < 200 mg/dL               Borderline High:  200 - 239 mg/dL          High:  > = 240 mg/dL   Triglycerides 220.0 (H) 0.0 - 149.0 mg/dL    Comment: Normal:  <150 mg/dLBorderline High:  150 - 199 mg/dL   HDL 41.80 >39.00 mg/dL   VLDL 44.0 (H) 0.0 - 40.0 mg/dL   Total CHOL/HDL Ratio 3     Comment:                Men          Women1/2 Average Risk     3.4          3.3Average Risk          5.0          4.42X Average Risk          9.6          7.13X Average Risk          15.0          11.0                       NonHDL 90.24     Comment: NOTE:  Non-HDL goal should be 30 mg/dL higher than patient's LDL goal (i.e. LDL goal of < 70 mg/dL, would have non-HDL goal of < 100 mg/dL)  Hemoglobin A1c     Status: None   Collection Time: 07/02/21 10:17 AM  Result Value Ref Range   Hgb A1c MFr Bld 6.4 4.6 - 6.5 %    Comment: Glycemic Control Guidelines for People with Diabetes:Non Diabetic:  <6%Goal of Therapy: <7%Additional Action Suggested:  >8%   Sedimentation rate     Status: None   Collection Time: 07/02/21 10:17 AM  Result Value Ref Range   Sed Rate 22 0 - 30 mm/hr  CBC  Status: None   Collection Time: 07/02/21 10:17 AM  Result Value Ref Range   WBC 8.4 4.0 - 10.5 K/uL   RBC 4.61 3.87 - 5.11 Mil/uL   Platelets 230.0 150.0 - 400.0 K/uL   Hemoglobin 14.0 12.0 - 15.0 g/dL   HCT 42.2 36.0 - 46.0 %   MCV 91.5 78.0 - 100.0 fl   MCHC 33.1 30.0 - 36.0 g/dL   RDW 13.8 11.5 - 15.5 %  LDL cholesterol, direct     Status: None   Collection Time: 07/02/21  10:17 AM  Result Value Ref Range   Direct LDL 67.0 mg/dL    Comment: Optimal:  <100 mg/dLNear or Above Optimal:  100-129 mg/dLBorderline High:  130-159 mg/dLHigh:  160-189 mg/dLVery High:  >190 mg/dL  HM MAMMOGRAPHY     Status: None   Collection Time: 07/08/21 12:00 AM  Result Value Ref Range   HM Mammogram 0-4 Bi-Rad 0-4 Bi-Rad, Self Reported Normal  Pro b natriuretic peptide     Status: None   Collection Time: 07/16/21 12:26 PM  Result Value Ref Range   NT-Pro BNP 363 0 - 738 pg/mL    Comment: The following cut-points have been suggested for the use of proBNP for the diagnostic evaluation of heart failure (HF) in patients with acute dyspnea: Modality                     Age           Optimal Cut                            (years)            Point ------------------------------------------------------ Diagnosis (rule in HF)        <50            450 pg/mL                           50 - 75            900 pg/mL                               >75           1800 pg/mL Exclusion (rule out HF)  Age independent     300 pg/mL   Basic metabolic panel     Status: Abnormal   Collection Time: 07/16/21 12:26 PM  Result Value Ref Range   Sodium 133 (L) 135 - 145 mEq/L   Potassium 3.9 3.5 - 5.1 mEq/L   Chloride 93 (L) 96 - 112 mEq/L   CO2 30 19 - 32 mEq/L   Glucose, Bld 103 (H) 70 - 99 mg/dL   BUN 12 6 - 23 mg/dL   Creatinine, Ser 1.00 0.40 - 1.20 mg/dL   GFR 55.07 (L) >60.00 mL/min    Comment: Calculated using the CKD-EPI Creatinine Equation (2021)   Calcium 9.9 8.4 - 10.5 mg/dL  HM MAMMOGRAPHY     Status: None   Collection Time: 07/21/21 12:00 AM  Result Value Ref Range   HM Mammogram 0-4 Bi-Rad 0-4 Bi-Rad, Self Reported Normal  HM MAMMOGRAPHY     Status: None   Collection Time: 07/21/21 12:00 AM  Result Value Ref Range   HM Mammogram 0-4 Bi-Rad 0-4 Bi-Rad, Self Reported Normal  POCT INR  Status: None   Collection Time: 07/24/21  8:39 AM  Result Value Ref Range   INR 2.3 2.0 -  3.0  POCT INR     Status: None   Collection Time: 07/31/21  8:05 AM  Result Value Ref Range   INR 2.0 2.0 - 3.0      Psychiatric Specialty Exam: Physical Exam  Review of Systems  Weight 184 lb (83.5 kg).There is no height or weight on file to calculate BMI.  General Appearance: NA  Eye Contact:  NA  Speech:  Normal Rate  Volume:  Normal  Mood:  Anxious  Affect:  NA  Thought Process:  Goal Directed  Orientation:  Full (Time, Place, and Person)  Thought Content:  Rumination  Suicidal Thoughts:  No  Homicidal Thoughts:  No  Memory:  Immediate;   Fair Recent;   Fair Remote;   Good  Judgement:  Intact  Insight:  Present  Psychomotor Activity:  NA  Concentration:  Concentration: Fair and Attention Span: Fair  Recall:  [employer] of Knowledge:  Good  Language:  Good  Akathisia:  No  Handed:  Right  AIMS (if indicated):     Assets:  Communication Skills Desire for Improvement Housing Resilience Transportation  ADL's:  Intact  Cognition:  WNL  Sleep:   fair      Assessment and Plan: Major depressive disorder, recurrent.  Generalized anxiety disorder.  I reviewed her current medication.  She is taking hydrocodone, gabapentin, Coumadin, Plavix along with Pristiq and trazodone.  Discussed medication side effects and benefits.  I also reviewed the blood work results.  She apologized missing last appointment.  She is in the process of changing her PCP because her current PCP is far from where she lives.  She is trying to get appointment at [facility]..  We will continue trazodone 100 mg at bedtime and Pristiq 50 mg at bedtime.  Recommended to call us back if she is any question or any concern.  Follow-up in 3 months.  Follow Up Instructions:    I discussed the assessment and treatment plan with the patient. The patient was provided an opportunity to ask questions and all were answered. The patient agreed with the plan and demonstrated an understanding of  the instructions.   The patient was advised to call back or seek an in-person evaluation if the symptoms worsen or if the condition fails to improve as anticipated.  I provided 22 minutes of non-face-to-face time during this encounter.   Kathlee Nations, MD

## 2021-08-18 NOTE — ED Triage Notes (Addendum)
Patient arrives via POV for evaluation for suspected left thigh cellulitis. Per patient, she had a "suspicious mole" removed on 10/4 and the site has become red, streaking, with drainage.   She has been started on Antibiotics on 10/11 with no improvements. Sent here for further evaluation and IV antibiotics.

## 2021-08-18 NOTE — ED Provider Notes (Signed)
Emergency Medicine Provider Triage Evaluation Note  Laurie Horton , a 75 y.o. female  was evaluated in triage.  Pt complains of redness, pain and swelling to the left leg after a skin biopsy last week. Sent here by derm for IV abx.  Review of Systems  Positive: Redness, pain and swelling to the left leg Negative: fever  Physical Exam  BP (!) 123/109 (BP Location: Left Arm)   Pulse 88   Temp 98.7 F (37.1 C) (Oral)   Resp 16   SpO2 90%  Gen:   Awake, no distress   Resp:  Normal effort  MSK:   Moves extremities without difficulty  Other:  Wound with surrounding erythema, warmth and TTP to the left proximolateral thigh  Medical Decision Making  Medically screening exam initiated at 12:13 PM.  Appropriate orders placed.  Laurie Horton was informed that the remainder of the evaluation will be completed by another provider, this initial triage assessment does not replace that evaluation, and the importance of remaining in the ED until their evaluation is complete.     Rodney Booze, PA-C 08/18/21 1216    Daleen Bo, MD 08/18/21 2023

## 2021-08-21 ENCOUNTER — Encounter: Payer: Self-pay | Admitting: Internal Medicine

## 2021-08-21 ENCOUNTER — Other Ambulatory Visit: Payer: Self-pay

## 2021-08-21 ENCOUNTER — Ambulatory Visit (INDEPENDENT_AMBULATORY_CARE_PROVIDER_SITE_OTHER): Payer: Medicare Other

## 2021-08-21 ENCOUNTER — Ambulatory Visit (INDEPENDENT_AMBULATORY_CARE_PROVIDER_SITE_OTHER): Payer: Medicare Other | Admitting: Internal Medicine

## 2021-08-21 VITALS — BP 124/60 | HR 65 | Temp 98.1°F | Ht 64.0 in | Wt 180.0 lb

## 2021-08-21 DIAGNOSIS — J441 Chronic obstructive pulmonary disease with (acute) exacerbation: Secondary | ICD-10-CM | POA: Diagnosis not present

## 2021-08-21 DIAGNOSIS — J449 Chronic obstructive pulmonary disease, unspecified: Secondary | ICD-10-CM | POA: Diagnosis not present

## 2021-08-21 NOTE — Telephone Encounter (Signed)
Lot of patient complaints about [employer].   [Plan  = please brng to admin [place] - we need to use another company

## 2021-08-21 NOTE — Telephone Encounter (Signed)
FYI for MR.  thanks

## 2021-08-21 NOTE — Patient Instructions (Addendum)
COPD, severe ([place]) COPD frequent exacerbation with current exacerbation   -improved and stable  - cxr report pending  - tolerating yupelri other than dizziness intermittently but seems you are able to manage that and overall making you better - sorry to hear you had bad experience in ER recently  - noted continued need for night o2 concentrators at home  -noted again that you need another concentrator for home upstairs - not sure what happened to request last time   Plan -continue YUPELRI nebulizer once daily - use 2.5L o2 at night and daytime continuous -Get letter/prescription from [place] to get to concentrators at home 1 downstairs and 1 for upstairs -Continue Helios portable system -await cxr result    Follow-up - 12 weeks with  DR [name]

## 2021-08-21 NOTE — Progress Notes (Signed)
OV 05/19/2016  Chief Complaint  Patient presents with   Follow-up    Pt c/o worsening SOB, prod cough with thick white mucus.      This is a routine follow-up. Last visit was in April 2017 with nurse practitioner. At that time treated for COPD exacerbation according to her history but review of the chart does not show that to be true. At this point in time she says COPD stable although she says she might be in flare up on account of her fibromyalgia. Starting her symptoms out it appears that it is generally stable with dyspnea at baseline and cough with mild sputum at baseline. She is more hobbled by her chronic pain and fibromyalgia. Couple months ago apparently a hydrocodone was discontinued by rheumatology and therefore she is having "withdrawal". She does have a new pain medication physician. She is on gabapentin for fibromyalgia. There are no other new issues. She does not want antibiotics or prednisone for her perceived exacerbation  OV 11/17/2016  Chief Complaint  Patient presents with   Follow-up    Pt states her SOB has worsened since last OV. Pt states she has a burning in her chest when she becomes SOB. Pt c/o prod cough with white mucus in morning, cough becomes nonprod throughout the day.     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 Last CT chest March 2017 without any mass and associated mild cor pulmonale   Six-month follow-up visit. She is completely overwhelmed by her fibromyalgia and depression. The fibromyalgia is worse. She says she is change doctors because of this. She's had a few admissions in the interim but none of them give a COPD exacerbation according to chart review. She suffered frustrated by her heels oxygen system even though it is light it is causing her pain. She uses Atrovent nebulizer but wants change to something else but at the same time has rejected use of any other nebulizer  or oral inhaler because of side effects. She is burning chest pain with inspiration and associated with her costochondral junction trigger points. 2014 review of the chart shows normal cardiac stress test. November 2017 chest x-ray is clear. She does not want any further imaging. Chest pain is mild to severe and variable. Worsened with inspiration. No radiation associated wheezing. No sputum production  OV 05/25/2017  Chief Complaint  Patient presents with   Follow-up    Pt states her SOB has worsened since last OV in 11/2016. Pt states she only coughs after her neb treatment - pt states her mucus is yellow in color and c/o occ chest discomfort. Pt denies f/c/s.     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 Last CT chest March 2017 without any mass and associated mild cor pulmonale   Follow-up exertional hypoxemia associated with his emphysema. Also associated with fibromyalgia. Last visit she had atypical chest pain. Recommended she see cardiology. 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 According to her history she has significant coronary artery disease but is fairly advanced. She is frustrated with this but realistic. She feels her days are number. In terms of COPD emphysema and is stable. She is on Atrovent inhalers. She is on oxygen. She wants a lighter system. She still burden by fibromyalgia. 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  She uses oxygen and nebulizers.  She is not interested in rehab or vaccines.  New issue: Preoperative pulmonary evaluation.  She is having significant bilateral lower extremity claudication.  She states that she is in severe pain walking from her door to the mailbox to the point she is almost crying.  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  Apparently Dr. Trula Slade wants to try a laparotomy approach.  However Dr. Broadus John might take her to the cardiac Cath Lab and placed stents.  She says she is sensitive to fentanyl and is worried about anesthesia complications but the pain is so severe she is willing to take the risk.  She has had previous cardiac catheterization without any problems other than being sensitive to fentanyl.  She says this can be done in the cardiac Cath Lab with anesthesia support.  She wants me to talk to Dr. Betsy Coder and Dr. Trula Slade about this.   OV 07/18/2018  Subjective:  Patient ID: [name], female , DOB: 1946/01/28 , age 75 y.o. , MRN: [id number] , ADDRESS: [address] [place] [address]   07/18/2018 -   Chief Complaint  Patient presents with   Follow-up    Pt states her chest is hurting her all the time now and states she does not think her neb solutions are working for her anymore now. Pt also states she has had some worsening SOB and is also coughing up white phlegm which is comes out in chunks.     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  She uses oxygen and nebulizers. Last CT chest March 2017 without any mass and associated mild cor pulmonale   HPI PRAJNA VANDERPOOL 75 y.o. -after last visit she saw nurse  practitioner in June 2019 fora mild respiratory flare. At the time treated with allergy medications antihistamines.She tells me thathe last saw me January 2019 she had an iliac stent place in the left side and after that her effort tolerance is improved but in the last few months she's noticed a decrease in effort tolerance with worsening dyspnea and also increased cough and increased sputum production in volume and also consistency without change in color This no fever or weight loss. She will not have a flu shot because of prior allergy. Her last CT scan of the chest was in 2017 and she is requesting for another one. Her inhaler as ipratropium which she says she's not happy with. In the past she's uses Spiriva and Symbicort and these have caused blisters and so she is generally where you have inhalers although she wants something other than her current one.       08/19/2018  - Visit   [name].  Patient was last seen in our office visit on 07/18/2018  started on Bevespi.  Patient reports that after 1 day of use of the Bevespi inhaler she developed thrush and mouth sores.  She she contacted our office to be treated with nystatin.  Patient reports that most mouth sores resolved there remains one.  Patient is also having extensive dental work done.  Patient also has been treated for scabies as well as impetigo by dermatology recently.   Patient completed a high-res CT in 07/29/2018 that showed no real changes from baseline.  Still showing severe emphysema.   Patient reports that she continues to use her Atrovent nebulizer but is wondering if there is any other options available for her.  Patient feels that the Atrovent nebulizer is not working as well.  Patient is currently using as needed and not using it scheduled.  MMRC - Breathlessness Score 3 - I stop for breath after walking about 100 yards or after a few minutes on level ground (isle at grocery store is  159f)  Patient reports that she has known triggers of shortness of breath with exertion, when there is high pollen counts, when she is walking, when she is outside for extended periods of time.  Patient reports she is been using 2 L via nasal cannula of oxygen with exertion as well as at rest.  Patient reports she forgot her POC at home.  She arrived to our office on room air.  Oxygen saturations 87.  Patient refused oxygen in our office and states that she does not think she needs oxygen right now.  Patient is sad and concerned regarding her limitations with vascular surgery.  Patient believes that she needs a surgery but reports that vascular team as well as cardiology does not think that she would be a good surgical candidate.  She reports that she has not heard back from Dr. BNaida Sleightoffice regarding her most recent test results.   Tests:  01/21/2016-CT chest without contrast- moderate centrilobular emphysema and diffuse bronchial wall thickening mild subpleural density in the dependent lower lobes  01/20/2016-pulmonary function test- airway obstruction and diffusion defect suggesting emphysema  Imaging:  02/11/2017-chest x-ray-stable large cardiac silhouette, lungs are hyperinflated, interstitial edema pattern unchanged from prior scans   Cardiac:  07/08/2017-echocardiogram-LV ejection fraction 50 to 524% grade 1 diastolic dysfunction      OV 12/21/2018  Subjective:  Patient ID: KArmen Pickup female , DOB: [birth year], age 89y.o. 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  She uses oxygen and nebulizers. Last CT chest March 2017 without any mass and associated mild cor pulmonale  12/21/2018 -   Chief Complaint  Patient presents with   Follow-up    Pt states due to being switched to a new  medication, she has had labored breathing. SOB is with exertion, has an occ cough with white phlegm, and also has had some occ CP.     HPI KNIANG MITCHELTREE89y.o. -  Presents for routine follow-up.  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  She uses oxygen sporadically and nebulizer sporadically.  She is very afraid of medicines because of multiple allergies.  She says her psychiatrist Dr. AMelissa Montaneplaced her on some medications that caused a rash.  Other than that she is okay.      OV 09/15/2019  Subjective:  Patient ID: [name], female , DOB: 12-12-1945 , age 28 y.o. 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  She uses oxygen and nebulizers. Last CT chest March 2017 without any mass and associated mild cor pulmonale   09/15/2019 -  No chief complaint on file.    HPI ANGELY DIETZ 75 y.o. -     ROS - per HPI     OV 01/17/2020  Subjective:  Patient ID: [name], female , DOB: 1946-09-14 , age 64 y.o. , MRN: [id number] , ADDRESS: [address] [place] [address]   01/17/2020 -   Chief Complaint  Patient presents with   Follow-up     HPI TYASHIA MORRISETTE 75 y.o. -presents for face-to-face follow-up.  In December she called with a COPD flareup symptoms.  She wanted Biaxin despite her allergies.  She feels that Biaxin helps her.  She says she was given prednisone which helped but she was only given generic clarithromycin instead of the tradename Biaxin.  She says it did not work.  She wants tradename Biaxin only.  She says since then her cough is more than baseline.  She also is like sputum that is discolored.  She feels like she is in exacerbation and this is preventing her from getting the COVID-19 vaccine.   She feels another course of tradename Biaxin is required.  She again does not want generic clarithromycin.  She is compliant with her baseline nebulizer nighttime oxygen.  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  She is now recovering from that.  She talked about Covid vaccine.  She wants to get it.  He has had pneumonia vaccine without problem but recently flu shot she feels this put her in the hospital.  She also has multiple oral drug allergies.  Overall she continues to mask and follow social distancing.       CAT COPD Symptom & Quality of Life Score (GSK trademark) 0 is no burden. 5 is highest burden 07/18/2018  12/21/2018  01/17/2020   Never Cough -> Cough all the time '3 3 3  ' No phlegm in chest -> Chest is full of phlegm '5 3 4  ' No chest tightness -> Chest feels very tight '4 3 3  ' No dyspnea for 1 flight stairs/hill -> Very dyspneic for 1 flight of stairs '5 3 5  ' No limitations for ADL at home -> Very limited with ADL at home '5 4 3  ' Confident leaving home -> Not at all confident leaving home 0 1 2  Sleep soundly -> Do not sleep soundly because of lung condition '3 4 3  ' Lots of Energy -> No energy at all '5 4 4  ' TOTAL Score (max 40)  '30 25 27   ' No flowsheet data found.    12/19/2020 -   Chief Complaint  Patient presents with   Follow-up    Had Covid PNA 10/2020, doing some better     HPI ANNALY SKOP 76 y.o. -presents for follow-up of her COPD.  She continues to use Atrovent nebulizer.  She prefers nebulizers of inhaler.  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  She uses oxygen at night with exertion.  She tells me that in December 2021 around  Christmas she had respiratory viral symptoms.  She says rapid antigen test by her daughter was negative.  Then in January 2022 she followed up with primary care physician who did a chest x-ray that showed pneumonia.  I have the chest x-ray with me visualized it.   There is an infiltrate.  She says she was told that she had Covid but there is no confirmatory evidence for this.  She treated herself at home.  She cannot have vaccines because of multiple drug allergies according to history.  I supported her in the decision making.  She is worried about abnormal chest x-ray.        CAT Score 12/19/2020  Total CAT Score 18       OV 01/30/2021  Subjective:  Patient ID: [name], female , DOB: 1946-10-03 , age 76 y.o. , MRN: [id number] , ADDRESS: [address] [place] [address] PCP [name] Agreste, MD Patient Care Team: [name] Agreste, MD as PCP - General (Family Medicine) Lorretta Harp, MD as PCP - Cardiology (Cardiology) Margaretha Sheffield, MD as Referring Physician (Physical Medicine and Rehabilitation) Laurence Spates, MD (Inactive) as Consulting Physician (Gastroenterology) Garvin Fila, MD as Consulting Physician (Neurology) Martinique, Peter M, MD as Consulting Physician (Cardiology) Brand Males, MD as Consulting Physician (Pulmonary Disease) Adele Schilder Arlyce Harman, MD as Consulting Physician (Psychiatry) Lorretta Harp, MD as Consulting Physician (Peripheral Vascular Disease)  This Provider for this visit: Treatment Team:  Attending Provider: Brand Males, MD    01/30/2021 -   Chief Complaint  Patient presents with   Follow-up    Doing ok, breathing is the same     HPI [name] 75 y.o. -presents for follow-up for COPD.  Here to review the results.  Her daughter Kately Graffam is on the phone.  No new interim complaints.  A Covid IgG is negative thus making her recent viral infection is unlikely as Covid.  I offered a referral for Covid monoclonal antibody prophylaxis but she declined.  Her pulmonary function test shows 12% decline in FEV1 in 5 years.  I reviewed the chart and do not find an alpha-1 check.  I presume this was done prior to [place] using electronic medical records.  She is open to  getting it tested again.  She is to go since 2 COPD.  She tells me that the DME company is out of supply with tubing for her nebulizer.  She has used an inhaler but she is somewhat skeptical because of nasal septal perforation.  Explained to her inhalers oral.  She then talked about dentures.  Explained that we can do inhalers through an [facility].  She had high-resolution CT chest.  This shows emphysema.  There is evidence of cor pulmonale.  There is stable thoracic aorta aneurysm.  No fibrosis no cancer.   CAT Score 01/30/2021 12/19/2020  Total CAT Score 10 18        Ref Range & Units 1 mo ago  SARS COV1 AB(IGG)SPIKE,SEMI QN <1.00 index <1.00         IMPRESSION: 1. No evidence of interstitial lung disease. Air trapping is indicative of small airways disease. 2. Ascending aortic aneurysm, stable. Recommend annual imaging followup by CTA or MRA. This recommendation follows 2010 ACCF/AHA/AATS/ACR/ASA/SCA/SCAI/SIR/STS/SVM Guidelines for the Diagnosis and Management of Patients with Thoracic Aortic Disease. Circulation. 2010; 121: U889-V694. Aortic aneurysm NOS (ICD10-I71.9). 3. Aortic atherosclerosis (ICD10-I70.0). Coronary artery calcification. 4. Enlarged pulmonic trunk, indicative of pulmonary arterial hypertension.  5.  Emphysema (ICD10-J43.9).     Electronically Signed   By: Lorin Picket M.D.   On: 01/15/2021 11:52     OV 07/22/2021  Subjective:  Patient ID: [name], female , DOB: 11/12/1945 , age 58 y.o. , MRN: [id number] , ADDRESS: [address], [place] [address] PCP [name] Agreste, MD Patient Care Team: [name] Agreste, MD as PCP - General (Family Medicine) Martin[place], Peter M, MD as PCP - Cardiology (Cardiology) Margaretha Sheffield, MD as Referring Physician (Physical Medicine and Rehabilitation) Laurence Spates, MD (Inactive) as Consulting Physician (Gastroenterology) Garvin Fila, MD as Consulting Physician (Neurology) [place], Peter  M, MD as Consulting Physician (Cardiology) Brand Males, MD as Consulting Physician (Pulmonary Disease) Adele Schilder Arlyce Harman, MD as Consulting Physician (Psychiatry)  This Provider for this visit: Treatment Team:  Attending Provider: Brand Males, MD    07/22/2021 -   Chief Complaint  Patient presents with   Follow-up    Pt states she had pneumonia since last visit. States she went to see PCP after finishing meds and was told she still had infection and was placed back on abx by PCP.   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  She uses oxygen and nebulizer  HPI GEORGIAN MCCLORY 75 y.o. -returns for follow-up.  On 06/18/2021 she called with COPD exacerbation symptoms.  Gave her Biaxin and prednisone.  But she tells me that because the line was busy our office did not call in the medications till 5 days later when they got hold of her.  She says she is only partially improved and she is her primary care physician.  She had chest x-ray 07/16/2021.  This reports of interstitial prominence.  However there is no mass or consolidation.  She had basic labs that I reviewed and it is normal.  Apparently primary care physician gave her another Biaxin.  But there is no prednisone.  She still has yellow symptoms.  Symptoms only partially resolved.  She feels another round of prednisone will help her.  Of note we put her on triple inhaler BREZTRI last visit but this gave her thrush.  She is taking ipratropium.  If she only takes it 3 times a day when she has tremors that she is managing just with 2 times a day and this is insufficient.  She is using oxygen continuous now up to 2-1/2 L.  She is now moved downstairs because of her shortness of breath.  She is wanting oxygen concentrator upstairs and downstairs.  In the last few to several years she is refused flu shot.  She will not have the  COVID-vaccine either.      OV 08/21/2021  Subjective:  Patient ID: [name], female , DOB: 1946/08/03 , age 75 y.o. , MRN: [id number] , ADDRESS: [address] [place]  [address] PCP [name] Agreste, MD Patient Care Team: [name] Agreste, MD as PCP - General (Family Medicine) [place], Peter M, MD as PCP - Cardiology (Cardiology) Margaretha Sheffield, MD as Referring Physician (Physical Medicine and Rehabilitation) Laurence Spates, MD (Inactive) as Consulting Physician (Gastroenterology) Garvin Fila, MD as Consulting Physician (Neurology) [place], Peter M, MD as Consulting Physician (Cardiology) Brand Males, MD as Consulting Physician (Pulmonary Disease) Adele Schilder Arlyce Harman, MD as Consulting Physician (Psychiatry)  This Provider for this visit: Treatment Team:  Attending Provider: Brand Males, MD  Follow-up emphysema with chronic  hypoxemic restorative failure and associated fibromyalgia and associated chronic systolic heart failure: Overall doing well.  She uses oxygen and nebulizer  08/21/2021 -   Chief Complaint  Patient presents with   Follow-up    Pt was at the ED 10/17 after PCP sent her there. States that she has been using Yupelri neb sol. States that it does make her feel dizzy but has helped with her breathing better than prior neb sol.     HPI NEMA OATLEY 75 y.o. -returns for follow-up to have a follow-up chest x-ray because of his concerns of interstitial edema versus atypical pneumonia at last visit.  I look to the follow-up chest x-ray.  Personal visualization shows it is unchanged.  I think this is a baseline based on the fact his CT scan of the chest in March 2022 look very similar.  She is feeling stable.  Also last visit we put her on Yupelri nebulizer.  Part of this follow-up was to see how she is doing with that.  She says it makes her dizzy but then after a while she is able to tolerate it.  So she is careful and some days she does  not use it.  But overall she feels its more beneficial than ipratropium.  Therefore she wants to stick with it.  Of note on 08/18/2021 she did have a video visit with her psychiatrist for depression.  She also has had a skin biopsy in her left thigh and apparently this was infected and she is sent to the ED for IV antibiotics but there was 11-hour wait and she left AMA without being seen.  She is very upset about her experience.  She is reflecting on it and is wondering whether she should go to the medical board and file a formal complaint.  But overall from a respiratory standpoint she is stable.    The other issues that she wants to oxygen concentrator's.  1 5 stairs and 1 for downstairs.  Last visit I thought I put an order in but she says at that never got it.  She feels service at [facility] is terrible.  I informed her that we will do the order again.  No results found for: NITRICOXIDE      PFT  PFT Results Latest Ref Rng & Units 01/30/2021 01/20/2016 08/14/2013  FVC-Pre L 2.61 2.79 -  FVC-Predicted Pre % 92 93 100  FVC-Post L - - 3.07  FVC-Predicted Post % - - 103  Pre FEV1/FVC % % 63 67 62  Post FEV1/FCV % % - - 64  FEV1-Pre L 1.63 1.86 1.84  FEV1-Predicted Pre % 77 82 82  FEV1-Post L - - 1.97  DLCO uncorrected ml/min/mmHg 9.45 9.75 11.57  DLCO UNC% % 49 40 50  DLCO corrected ml/min/mmHg 9.45 - -  DLCO COR %Predicted % 49 - -  DLVA Predicted % 57 51 58  TLC L 4.59 4.62 4.32  TLC % Predicted % 90 91 88  RV % Predicted % 80 78 77       has a past medical history of Cataract, CHF (congestive heart failure) ([facility]) (73/41/9379), Complication of anesthesia, COPD (chronic obstructive pulmonary disease) ([facility]), Depression, Depression, Diverticulitis, DVT (deep venous thrombosis) ([facility]), Fibromyalgia, GERD (gastroesophageal reflux disease), Heart murmur, Hiatal hernia, History of deviated nasal septum, HTN (hypertension), Hyperlipidemia, Hypertension, Hypertensive retinopathy,  Myocardial infarction ([facility]), Obesity, On supplemental oxygen therapy, Osteoporosis, Osteoporosis, Oxygen deficiency, PAT (paroxysmal atrial tachycardia) ([facility]), PFO (patent foramen ovale), PULMONARY  NODULE, LEFT LOWER LOBE (10/14/2009), PVD (peripheral vascular disease) with claudication ([facility]) (12/2017), Right middle lobe pneumonia (07/24/2011), Stroke ([facility]), and TOBACCO ABUSE (06/04/2009).   reports that she quit smoking about 11 years ago. Her smoking use included cigarettes. She has a 90.00 pack-year smoking history. She has never used smokeless tobacco.  Past Surgical History:  Procedure Laterality Date   ABDOMINAL HYSTERECTOMY N/A    [employer] 11/04/2020   CARDIOVASCULAR STRESS TEST  12/26/2004   EF 74%. NO EVIDENCE OF ISCHEMIA   CATARACT EXTRACTION Left    Dr. Elliot Dally   ESOPHAGOGASTRODUODENOSCOPY (EGD) WITH PROPOFOL N/A 04/15/2015   Procedure: ESOPHAGOGASTRODUODENOSCOPY (EGD) WITH PROPOFOL;  Surgeon: Laurence Spates, MD;  Location: [facility];  Service: Endoscopy;  Laterality: N/A;   EYE SURGERY Left    Cat Sx   JOINT REPLACEMENT N/A    Phreesia 11/04/2020   KNEE ARTHROSCOPY  2000   left   LAPAROSCOPIC CHOLECYSTECTOMY  04-16-2010   [place]   LOWER EXTREMITY ANGIOGRAPHY N/A 09/09/2017   Procedure: Lower Extremity Angiography;  Surgeon: Lorretta Harp, MD;  Location: [place];  Service: Cardiovascular;  Laterality: N/A;   LOWER EXTREMITY INTERVENTION Left 01/17/2018   Procedure: LOWER EXTREMITY INTERVENTION;  Surgeon: Lorretta Harp, MD;  Location: [place] [facility];  Service: Cardiovascular;  Laterality: Left;   MOUTH SURGERY     03-26-15 multiple extractions stitches remains   PERIPHERAL VASCULAR INTERVENTION Left 01/17/2018   Procedure: PERIPHERAL VASCULAR INTERVENTION;  Surgeon: Lorretta Harp, MD;  Location: [place];  Service: Cardiovascular;  Laterality: Left;  COMMON ILIAC   RIGHT/LEFT HEART CATH AND CORONARY ANGIOGRAPHY N/A 03/04/2017   Procedure:  Right/Left Heart Cath and Coronary Angiography;  Surgeon: Peter M Martinique, MD;  Location: [facility] [facility];  Service: Cardiovascular;  Laterality: N/A;   TOTAL ABDOMINAL HYSTERECTOMY     post op needed oxygen was told "she gave them a scare"   TUBAL LIGATION     US ECHOCARDIOGRAPHY  11/20/2009   EF 55-60%    Allergies  Allergen Reactions   Alprazolam Anaphylaxis and Other (See Comments)    REACTION: stops breathing   Bee Venom Anaphylaxis   Iodine Anaphylaxis, Swelling and Other (See Comments)    REACTION: swelling in throat   Pseudoephedrine Hcl Er Shortness Of Breath   Budesonide-Formoterol Fumarate Other (See Comments)    Blisters inside of mouth all over   Crestor [Rosuvastatin Calcium] Other (See Comments)    Unable to walk   Esomeprazole Magnesium Other (See Comments)    REACTION: "bouncing off walls"   Flonase [Fluticasone Propionate] Other (See Comments)    NOSE BLEED   Lamictal [Lamotrigine] Rash    Patient got rash, labored breathing, and diarrhea   Loratadine Other (See Comments)    claritin D causes shaking   Lotrimin [Clotrimazole] Other (See Comments)    Mouth blisters   Lunesta [Eszopiclone] Other (See Comments)    REACTION: "slept for a week"   Oxcarbazepine Other (See Comments)    Causes deep sleep and dizziness   Statins Other (See Comments)    Can't walk, legs won't work    Zolpidem Tartrate Other (See Comments)    REACTION: "slept for a week"   Betadine [Povidone Iodine] Other (See Comments)    Breathing problems   Bevespi Aerosphere [Glycopyrrolate-Formoterol] Other (See Comments)    Pt believes this caused mouth sores and thrush    Breztri Aerosphere [Budeson-Glycopyrrol-Formoterol] Other (See Comments)    Ritta Slot  Clarithromycin Other (See Comments)    All "mycins", Puts into "a" fib, Will take if has to for severe sinus infection   Effexor [Venlafaxine] Nausea And Vomiting and Other (See Comments)    cramps   Lexapro [Escitalopram Oxalate] Other  (See Comments)    hallucinations   Aciphex [Rabeprazole Sodium] Rash   Alendronate Sodium Other (See Comments)    "caused stomach problems for 3 days"   Avelox [Moxifloxacin Hcl In Nacl] Other (See Comments)    Stomach cramps.    Bextra [Valdecoxib] Rash   Ceclor [Cefaclor] Rash   Cephalexin Rash and Other (See Comments)    Pt states that she is possibly allergic to this - had a reaction to Cefaclor in the past and she does not want to these class drugs. Added per patient request.   Covera-Hs [Verapamil Hcl] Palpitations   Dicyclomine Hcl Rash   Other Other (See Comments)    Glue from ekg/heart monitor leads --rash, Any MYCINS   Tessalon Perles Rash    Immunization History  Administered Date(s) Administered   Influenza Split 08/03/2011, 08/01/2012   Influenza,inj,Quad PF,6+ Mos 08/14/2013, 09/03/2014, 09/04/2015, 08/18/2016   Influenza-Unspecified 11/03/1999   Pneumococcal Conjugate-13 10/01/2014   Pneumococcal Polysaccharide-23 09/03/1999, 06/02/2006, 08/14/2013   Td 11/02/1994   Tdap 08/18/2016    Family History  Problem Relation Age of Onset   Dementia Mother    Diabetes Mother    Alzheimer's disease Mother    Heart attack Brother 30   Heart attack Father    Schizophrenia Sister    Diabetes Sister    Tremor Sister      Current Outpatient Medications:    carvedilol (COREG) 12.5 MG tablet, TAKE 1 TABLET BY MOUTH TWICE A DAY, Disp: 180 tablet, Rfl: 3   cephALEXin (KEFLEX) 500 MG capsule, Take 500 mg by mouth 2 (two) times daily., Disp: , Rfl:    clopidogrel (PLAVIX) 75 MG tablet, TAKE 1 TABLET BY MOUTH EVERY DAY, Disp: 30 tablet, Rfl: 11   desvenlafaxine (PRISTIQ) 50 MG 24 hr tablet, Take 1 tablet (50 mg total) by mouth daily., Disp: 30 tablet, Rfl: 2   diclofenac sodium (VOLTAREN) 1 % GEL, APPLY 2 GRAMS TO BOTH HANDS 4 TIMES A DAY, Disp: , Rfl: 5   Eyelid Cleansers (OCUSOFT BABY EYELID & EYELASH EX), Apply topically., Disp: , Rfl:    furosemide (LASIX) 40 MG tablet,  TAKE 1 TABLET BY MOUTH EVERY DAY, Disp: 90 tablet, Rfl: 3   gabapentin (NEURONTIN) 100 MG capsule, Take 200 mg by mouth See admin instructions. Take 1 in am and 1 in the lunch and 2 at bedtime, Disp: , Rfl:    HYDROcodone-acetaminophen (NORCO/VICODIN) 5-325 MG tablet, Take 1 tablet by mouth 3 (three) times daily., Disp: , Rfl:    montelukast (SINGULAIR) 10 MG tablet, TAKE 1 TABLET BY MOUTH EVERYDAY AT BEDTIME, Disp: 90 tablet, Rfl: 2   Multiple Vitamins-Minerals (VITAMIN D3 COMPLETE PO), Take by mouth., Disp: , Rfl:    nitroGLYCERIN (NITROSTAT) 0.4 MG SL tablet, PLACE 1 TABLET UNDER THE TONGUE EVERY 5 MINUTES AS NEEDED FOR CHEST PAIN, Disp: 25 tablet, Rfl: 3   Omega-3 Fatty Acids (FISH OIL) 1000 MG CAPS, Take by mouth daily., Disp: , Rfl:    ondansetron (ZOFRAN-ODT) 8 MG disintegrating tablet, Take 1 tablet (8 mg total) by mouth every 8 (eight) hours as needed for nausea or vomiting., Disp: 20 tablet, Rfl: 0   oxybutynin (DITROPAN-XL) 10 MG 24 hr tablet, Take 1 tablet (10 mg  total) by mouth daily., Disp: 90 tablet, Rfl: 2   OXYGEN, Inhale 1.5-3 L into the lungs as needed (for shortness of breath)., Disp: , Rfl:    pantoprazole (PROTONIX) 40 MG tablet, TAKE 1 TABLET BY MOUTH EVERY DAY, Disp: 90 tablet, Rfl: 1   polyethylene glycol (MIRALAX / GLYCOLAX) packet, Take 17 g by mouth daily as needed for mild constipation. , Disp: , Rfl:    PRALUENT 75 MG/ML SOAJ, INJECT 75 MG INTO THE SKIN EVERY 14 (FOURTEEN) DAYS., Disp: 2 mL, Rfl: 11   revefenacin (YUPELRI) 175 MCG/3ML nebulizer solution, Take 3 mLs (175 mcg total) by nebulization daily., Disp: 90 mL, Rfl: 11   spironolactone (ALDACTONE) 25 MG tablet, TAKE 1 TABLET BY MOUTH EVERY DAY, Disp: 90 tablet, Rfl: 3   traZODone (DESYREL) 100 MG tablet, Take one tab daily as needed for sleep, Disp: 30 tablet, Rfl: 2   triamcinolone cream (KENALOG) 0.1 %, SMARTSIG:1 Sparingly Topical Twice Daily PRN, Disp: , Rfl:    warfarin (COUMADIN) 5 MG tablet, TAKE 1/2 TO 1  TABLET BY MOUTH EVERY DAY OR AS DIRECTED BY COUMADIN CLINIC, Disp: 30 tablet, Rfl: 5      Objective:   Vitals:   08/21/21 1007  BP: 124/60  Pulse: 65  Temp: 98.1 F (36.7 C)  TempSrc: Oral  SpO2: 92%  Weight: 180 lb (81.6 kg)  Height: '5\' 4"'  (1.626 m)    Estimated body mass index is 30.9 kg/m as calculated from the following:   Height as of this encounter: '5\' 4"'  (1.626 m).   Weight as of this encounter: 180 lb (81.6 kg).  '@WEIGHTCHANGE' @  [employer]   08/21/21 1007  Weight: 180 lb (81.6 kg)     Physical Exam   General: No distress. Looks well Neuro: Alert and Oriented x 3. GCS 15. Speech normal Psych: Pleasant Resp:  Barrel Chest - no.  Wheeze - no, Crackles - no, No overt respiratory distress CVS: Normal heart sounds. Murmurs - no Ext: Stigmata of Connective Tissue Disease - no HEENT: Normal upper airway. PEERL +. No post nasal drip        Assessment:     No diagnosis found.     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   -improved and stable  - cxr report pending  - tolerating yupelri other than dizziness intermittently but seems you are able to manage that and overall making you better - sorry to hear you had bad experience in ER recently  - noted continued need for night o2 concentrators at home  -noted again that you need another concentrator for home upstairs - not sure what happened to request last time   Plan -continue YUPELRI nebulizer once daily - use 2.5L o2 at night and daytime continuous -Get letter/prescription from [place] to get to concentrators at home 1 downstairs and 1 for upstairs -Continue [facility] portable system -await cxr result    Follow-up - 12 weeks with  DR Gaye Alken    Dr. Brand Males, M.D., F.C.C.P,  Pulmonary and Critical Care Medicine Staff Physician, [facility] Director - Interstitial Lung Disease  Program   [facility] [place], [place], [address]  Pager: [phone], If no answer or between  15:00h - 7:00h: call [phone] Telephone: [phone]  10:32 AM 08/21/2021

## 2021-08-22 ENCOUNTER — Ambulatory Visit (INDEPENDENT_AMBULATORY_CARE_PROVIDER_SITE_OTHER): Payer: Medicare Other | Admitting: Pharmacist

## 2021-08-22 DIAGNOSIS — Z7901 Long term (current) use of anticoagulants: Secondary | ICD-10-CM | POA: Diagnosis not present

## 2021-08-22 DIAGNOSIS — I480 Paroxysmal atrial fibrillation: Secondary | ICD-10-CM | POA: Diagnosis not present

## 2021-08-22 LAB — POCT INR: INR: 2 (ref 2.0–3.0)

## 2021-08-22 NOTE — Patient Instructions (Signed)
Description   Continue taking 1/2 a tablet daily, except 1 tablet on Wednesday. Recheck INR in 4 weeks. [facility] [phone]

## 2021-08-29 LAB — COLOGUARD: COLOGUARD: NEGATIVE

## 2021-09-04 NOTE — Telephone Encounter (Signed)
MR please see the pts message.  thanks

## 2021-09-05 NOTE — Telephone Encounter (Signed)
   At last visit the instruction was, "-Get letter/prescription from [place] to get to concentrators at home 1 downstairs and 1 for upstairs" . So she needs 2 concentrators . Please call [employer] and find out how they can facilitate that  2. 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 [place] inflation is high and there are not enough people to do the job. This is why companies go outside. AS frustrating as it is - this is reality and we really cannot control it. The next DME company can also do the same  3. IF she wants referral to [facility] please do that - she can try.

## 2021-09-09 NOTE — Telephone Encounter (Signed)
I have sent a community message to [employer] to see if they are able to help with getting the pt what she needs.

## 2021-09-10 NOTE — Telephone Encounter (Signed)
MR I sent a community message to [employer] and they stated that they did attempt to call the pt but they could not reach her.  They stated that if she wants the second concentrator that she will have to private pay for that.   I have let the pt know this as well.

## 2021-09-10 NOTE — Telephone Encounter (Signed)
Ok thanks. Please let me know if there is further concern.

## 2021-09-19 ENCOUNTER — Other Ambulatory Visit: Payer: Self-pay

## 2021-09-19 ENCOUNTER — Ambulatory Visit (INDEPENDENT_AMBULATORY_CARE_PROVIDER_SITE_OTHER): Payer: Medicare Other

## 2021-09-19 DIAGNOSIS — I480 Paroxysmal atrial fibrillation: Secondary | ICD-10-CM

## 2021-09-19 DIAGNOSIS — Z7901 Long term (current) use of anticoagulants: Secondary | ICD-10-CM | POA: Diagnosis not present

## 2021-09-19 DIAGNOSIS — Z5181 Encounter for therapeutic drug level monitoring: Secondary | ICD-10-CM | POA: Diagnosis not present

## 2021-09-19 LAB — POCT INR: INR: 3.1 — AB (ref 2.0–3.0)

## 2021-09-19 NOTE — Patient Instructions (Signed)
Continue taking 1/2 a tablet daily, except 1 tablet on Wednesday. Recheck INR in 4 weeks. [facility] [phone].  Eat greens tonight.

## 2021-09-22 ENCOUNTER — Telehealth: Payer: Self-pay | Admitting: Internal Medicine

## 2021-09-23 NOTE — Telephone Encounter (Signed)
Last OV note has been faxed to [employer] per pts insurance.

## 2021-09-28 ENCOUNTER — Other Ambulatory Visit: Payer: Self-pay | Admitting: Cardiology

## 2021-09-29 ENCOUNTER — Telehealth: Payer: Self-pay | Admitting: Internal Medicine

## 2021-09-29 NOTE — Telephone Encounter (Signed)
Last OV notes have been faxed to [employer].

## 2021-10-13 NOTE — Progress Notes (Signed)
Cardiology Office Note    Date:  10/17/2021   ID:  Laurie Horton, Laurie Horton 1946/02/26, MRN [id number]  PCP:  Georgianne Fick, MD  Cardiologist: Dr. [place]   Chief Complaint  Patient presents with   Atrial Fibrillation   Congestive Heart Failure     History of Present Illness:    Laurie Horton is a 75 y.o. female with past medical history of chronic diastolic CHF, PFO, PAF (on Coumadin), COPD (on 2L [place] at baseline), HTN, and prior CVA who is seen for follow up CHF and PAD.  She was admitted from 4/9 - 02/12/2017 for worsening dyspnea on exertion and palpitations. Was in atrial fibrillation with RVR upon arrival to the ED. Echo during admission showed a newly reduced EF of 20-25% and she was diuresed with IV Lasix. Enzymes were negative and EKG showed no acute ischemic changes. It was recommended to consider a right/left heart cath in 4-6 weeks.   She did undergo right and left heart cath on 03/04/17. This showed severe 2 vessel obstructive CAD with 100% RCA occlusion, 75% OM1, and 90% small OM2. EF 25-30%. Mild pulmonary HTN with normal LV filling pressures. 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 Maximizing CHF therapy recommended.  She did have repeat Echo in September 2018 showing improvement in EF to 50-55%.   Subsequent to this she developed significant claudication. She was seen by Dr. Allyson Sabal and had angiography showing 80% infrarenal aortic stenosis and left iliac stenosis. She also had severe right common femoral stenosis. She was seen by Dr Myra Gianotti for consideration of Aortobifemoral bypass. After pulmonary evaluation she was felt to be too high a risk for open surgery. In March 2019 she underwent atherectomy and covered stenting of the left iliac by Dr. Allyson Sabal. On follow up she did have improvement in her claudication and ABIs. The aortic and right common femoral artery stenoses are not felt to be amenable to percutaneous therapy.   In early July 2019 she was seen because  she felt she was in Afib following dental procedure. On arrival she was in NSR with PACs. No medical changes made.   She has been followed by Dr Allyson Sabal and  dopplers in May 2020 indicated continued patency of the stent. No significant claudication. She was noted to have a high grade left subclavian stenosis but it was unclear that this was symptomatic and given all her medical problems was felt best to manage medically. She had left shoulder injection by Dr Cleophas Dunker for adhesive capsulitis. She notes this has helped with her pain significantly. She is followed by pulmonary for COPD.   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 INR had been therapeutic. At time of infarct it was 1.9. We decided to add plavix 75 mg daily due to her extensive vascular disease. MRI showed no acute infarct but she did have evidence of multiple old strokes.   She had follow up LE arterial dopplers in May 2021 which were stable. Carotid dopplers in January were unchanged.  She had PNA in Dec/Jan. Followed by Dr Colletta Maryland for COPD exacerbation. Had CT done. Aortic size at 4.1 cm.    She reports chronic SOB. No significant angina.  Has only had to use NTg on a couple of occasions.  No palpitations. Claudication symptoms are stable. Hasn't smoked in over 10 years. She does note her right leg goes numb at times but no pain.  Past Medical History:  Diagnosis Date   Cataract    OD   CHF (congestive heart failure) (HCC) 03/02/2017   EF 25-30% 2018   Complication of anesthesia    various issues with oxygen  saturations post op   COPD (chronic obstructive pulmonary disease) (HCC)    Depression    Depression    [employer]reesia 11/04/2020   Diverticulitis    DVT (deep venous thrombosis) (HCC)    Fibromyalgia    GERD (gastroesophageal reflux disease)    Heart murmur    Phreesia 11/04/2020   Hiatal hernia    History of deviated nasal septum    left- side   HTN  (hypertension)    Hyperlipidemia    Hypertension    Phreesia 11/04/2020   Hypertensive retinopathy    OU   Myocardial infarction (HCC)    Phreesia 11/04/2020   Obesity    On supplemental oxygen therapy    concentrator at night @ 1.5 l/m or when sleeps. O2 Sat niormally 87.   Osteoporosis    Osteoporosis    Phreesia 11/04/2020   Oxygen deficiency    [employer]reesia 11/04/2020   PAT (paroxysmal atrial tachycardia) (HCC)    PFO (patent foramen ovale)    PULMONARY NODULE, LEFT LOWER LOBE 10/14/2009   5mm LLL nodule dec 2010. Stable and 4mm in Oct 2012. 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 Persists on cxr 07/22/11. Cleared on CT 08/24/11. No further followup   Stroke [facility])    TOBACCO ABUSE 06/04/2009    Past Surgical History:  Procedure Laterality Date   ABDOMINAL HYSTERECTOMY N/A    [employer]reesia 11/04/2020   CARDIOVASCULAR STRESS TEST  12/26/2004   EF 74%. NO EVIDENCE OF ISCHEMIA   CATARACT EXTRACTION Left    Dr. Hortense Ramal   ESOPHAGOGASTRODUODENOSCOPY (EGD) WITH PROPOFOL N/A 04/15/2015   Procedure: ESOPHAGOGASTRODUODENOSCOPY (EGD) WITH PROPOFOL;  Surgeon: Carman Ching, MD;  Location: [facility] ENDOSCOPY;  Service: Endoscopy;  Laterality: N/A;   EYE SURGERY Left    Cat Sx   JOINT REPLACEMENT N/A    [employer]reesia 11/04/2020   KNEE ARTHROSCOPY  2000   left   LAPAROSCOPIC CHOLECYSTECTOMY  04-16-2010   [facility]   LOWER EXTREMITY ANGIOGRAPHY N/A 09/09/2017   Procedure: Lower Extremity Angiography;  Surgeon: Runell Gess, MD;  Location: [facility] INVASIVE CV LAB;  Service: Cardiovascular;  Laterality: N/A;   LOWER EXTREMITY INTERVENTION Left 01/17/2018   Procedure: LOWER EXTREMITY INTERVENTION;  Surgeon: Runell Gess, MD;  Location: [facility] INVASIVE CV LAB;  Service: Cardiovascular;  Laterality: Left;   MOUTH SURGERY     03-26-15 multiple extractions stitches remains   PERIPHERAL VASCULAR  INTERVENTION Left 01/17/2018   Procedure: PERIPHERAL VASCULAR INTERVENTION;  Surgeon: Runell Gess, MD;  Location: [facility] INVASIVE CV LAB;  Service: Cardiovascular;  Laterality: Left;  COMMON ILIAC   RIGHT/LEFT HEART CATH AND CORONARY ANGIOGRAPHY N/A 03/04/2017   Procedure: Right/Left Heart Cath and Coronary Angiography;  Surgeon: Allegra Cerniglia M [place], MD;  Location: [facility] INVASIVE CV LAB;  Service: Cardiovascular;  Laterality: N/A;   TOTAL ABDOMINAL HYSTERECTOMY     post op needed oxygen was told "she gave them a scare"   TUBAL LIGATION     US ECHOCARDIOGRAPHY  11/20/2009   EF 55-60%    Current Medications: Outpatient Medications Prior to Visit  Medication Sig Dispense Refill   carvedilol (COREG) 12.5 MG tablet TAKE 1  TABLET BY MOUTH TWICE A DAY 180 tablet 3   clopidogrel (PLAVIX) 75 MG tablet TAKE 1 TABLET BY MOUTH EVERY DAY 30 tablet 11   desvenlafaxine (PRISTIQ) 50 MG 24 hr tablet Take 1 tablet (50 mg total) by mouth daily. 30 tablet 2   diclofenac sodium (VOLTAREN) 1 % GEL APPLY 2 GRAMS TO BOTH HANDS 4 TIMES A DAY  5   Eyelid Cleansers (OCUSOFT BABY EYELID & EYELASH EX) Apply topically.     furosemide (LASIX) 40 MG tablet TAKE 1 TABLET BY MOUTH EVERY DAY 30 tablet 11   gabapentin (NEURONTIN) 100 MG capsule Take 200 mg by mouth See admin instructions. Take 1 in am and 1 in the lunch and 2 at bedtime     HYDROcodone-acetaminophen (NORCO/VICODIN) 5-325 MG tablet Take 1 tablet by mouth 3 (three) times daily.     montelukast (SINGULAIR) 10 MG tablet TAKE 1 TABLET BY MOUTH EVERYDAY AT BEDTIME 90 tablet 2   Multiple Vitamins-Minerals (VITAMIN D3 COMPLETE PO) Take by mouth.     nitroGLYCERIN (NITROSTAT) 0.4 MG SL tablet PLACE 1 TABLET UNDER THE TONGUE EVERY 5 MINUTES AS NEEDED FOR CHEST PAIN 25 tablet 3   Omega-3 Fatty Acids (FISH OIL) 1000 MG CAPS Take by mouth daily.     ondansetron (ZOFRAN-ODT) 8 MG disintegrating tablet Take 1 tablet (8 mg total) by mouth every 8 (eight) hours as needed for nausea or  vomiting. 20 tablet 0   oxybutynin (DITROPAN-XL) 10 MG 24 hr tablet Take 1 tablet (10 mg total) by mouth daily. 90 tablet 2   OXYGEN Inhale 1.5-3 L into the lungs as needed (for shortness of breath).     pantoprazole (PROTONIX) 40 MG tablet TAKE 1 TABLET BY MOUTH EVERY DAY 90 tablet 1   polyethylene glycol (MIRALAX / GLYCOLAX) packet Take 17 g by mouth daily as needed for mild constipation.      PRALUENT 75 MG/ML SOAJ INJECT 75 MG INTO THE SKIN EVERY 14 (FOURTEEN) DAYS. 2 mL 11   revefenacin (YUPELRI) 175 MCG/3ML nebulizer solution Take 3 mLs (175 mcg total) by nebulization daily. 90 mL 11   spironolactone (ALDACTONE) 25 MG tablet TAKE 1 TABLET BY MOUTH EVERY DAY 90 tablet 3   traZODone (DESYREL) 100 MG tablet Take one tab daily as needed for sleep 30 tablet 2   triamcinolone cream (KENALOG) 0.1 % SMARTSIG:1 Sparingly Topical Twice Daily PRN     warfarin (COUMADIN) 5 MG tablet TAKE 1/2 TO 1 TABLET BY MOUTH EVERY DAY OR AS DIRECTED BY COUMADIN CLINIC 30 tablet 5   cephALEXin (KEFLEX) 500 MG capsule Take 500 mg by mouth 2 (two) times daily. (Patient not taking: Reported on 10/17/2021)     No facility-administered medications prior to visit.     Allergies:   Alprazolam, Bee venom, Iodine, Pseudoephedrine hcl er, Budesonide-formoterol fumarate, Crestor [rosuvastatin calcium], Esomeprazole magnesium, Flonase [fluticasone propionate], Lamictal [lamotrigine], Loratadine, Lotrimin [clotrimazole], Lunesta [eszopiclone], Oxcarbazepine, Statins, Zolpidem tartrate, Betadine [povidone iodine], Bevespi aerosphere [glycopyrrolate-formoterol], Breztri aerosphere [budeson-glycopyrrol-formoterol], Clarithromycin, Effexor [venlafaxine], Lexapro [escitalopram oxalate], Aciphex [rabeprazole sodium], Alendronate sodium, Avelox [moxifloxacin hcl in nacl], Bextra [valdecoxib], Ceclor [cefaclor], Cephalexin, Covera-hs [verapamil hcl], Dicyclomine hcl, Other, and Tessalon perles   Social History   Socioeconomic History    Marital status: Divorced    Spouse name: Not on file   Number of children: 2   Years of education: Not on file   Highest education level: Not on file  Occupational History   Occupation: disability  Tobacco Use  Smoking status: Former    Packs/day: 3.00    Years: 30.00    Pack years: 90.00    Types: Cigarettes    Quit date: 06/02/2010    Years since quitting: 11.3   Smokeless tobacco: Never   Tobacco comments:    QUIT IN 2011  Vaping Use   Vaping Use: Never used  Substance and Sexual Activity   Alcohol use: No    Alcohol/week: 0.0 standard drinks   Drug use: No   Sexual activity: Yes    Partners: Male    Birth control/protection: Post-menopausal, Surgical  Other Topics Concern   Not on file  Social History Narrative   Marital status: divorced in 1978 after ten years; dating casually in 2019.      Children: 3 biological children; 3 court appointed children; 6 grandchildren; 5 gg      Lives: alone; children in [place] and [place].      Employment: retired age 35; disability for COPD, CVA at age 32.      Tobacco: former smoker; quit smoking 2011.      Alcohol: none      Exercise: walks dog four times per day; goes to pool three times per week.      ADLs: drives; no assistant devices; does have a walker.  Cleaning is limited in 2018.  Daughter helps with cleaning.  Does own grocery shopping.      Advanced Directives: YES; DNR/DNI; HCPOA: [name] Martin/daughter youngest.  Blind.               Social Determinants of Health   Financial Resource Strain: Not on file  Food Insecurity: Not on file  Transportation Needs: Not on file  Physical Activity: Not on file  Stress: Not on file  Social Connections: Not on file     Family History:  The patient's family history includes Alzheimer's disease in her mother; Dementia in her mother; Diabetes in her mother and sister; Heart attack in her father; Heart attack (age of onset: 4) in her brother; Schizophrenia in her  sister; Tremor in her sister.   Review of Systems:   As noted in HPI.  All other systems reviewed and are otherwise negative except as noted above.   Physical Exam:    VS:  BP 124/66   Pulse (!) 54   Ht 5\' 4"  (1.626 m)   Wt 178 lb 3.2 oz (80.8 kg)   SpO2 90%   BMI 30.59 kg/m    GENERAL:  Well appearing overweight WF in NAD HEENT:  PERRL, EOMI, sclera are clear. Oropharynx is clear. NECK:  No jugular venous distention, carotid upstroke brisk and symmetric, right carotid bruit,  left subclavian  bruit, no thyromegaly or adenopathy LUNGS:  Clear to auscultation bilaterally CHEST:  Unremarkable HEART:  RRR,  PMI not displaced or sustained,S1 and S2 within normal limits, no S3, no S4: no clicks, no rubs, no murmurs ABD:  Soft, nontender. BS +, no masses or bruits. No hepatomegaly, no splenomegaly EXT:  Poor pedal pulses, absent left radial pulse.  no edema, no cyanosis no clubbing SKIN:  Warm and dry.  No rashes NEURO:  Alert and oriented x 3. Cranial nerves II through XII intact. PSYCH:  Cognitively intact    Wt Readings from Last 3 Encounters:  10/17/21 178 lb 3.2 oz (80.8 kg)  08/21/21 180 lb (81.6 kg)  07/22/21 184 lb (83.5 kg)     Studies/Labs Reviewed:   Recent Labs: 07/02/2021: ALT 13 07/16/2021:  NT-Pro BNP 363 08/18/2021: BUN 8; Creatinine, Ser 1.02; Hemoglobin 14.0; Platelets 235; Potassium 3.6; Sodium 137   Lipid Panel    Component Value Date/Time   CHOL 132 07/02/2021 1017   CHOL 115 09/17/2020 0946   TRIG 220.0 (H) 07/02/2021 1017   HDL 41.80 07/02/2021 1017   HDL 36 (L) 09/17/2020 0946   CHOLHDL 3 07/02/2021 1017   VLDL 44.0 (H) 07/02/2021 1017   LDLCALC 52 09/17/2020 0946   LDLDIRECT 67.0 07/02/2021 1017    Additional studies/ records that were reviewed today include:   Echocardiogram: 02/09/2017 Study Conclusions   - Left ventricle: The cavity size was mildly dilated. Wall   thickness was increased in a pattern of mild LVH. Systolic   function  was severely reduced. The estimated ejection fraction   was in the range of 20% to 25%. Diffuse hypokinesis. Doppler   parameters are consistent with abnormal left ventricular   relaxation (grade 1 diastolic dysfunction). - Aortic valve: Valve area (Vmax): 1.4 cm^2. - Aortic root: The aortic root was mildly dilated. - Ascending aorta: The ascending aorta was mildly dilated. - Mitral valve: Calcified annulus. There was moderate   regurgitation. - Left atrium: The atrium was moderately dilated. - Right ventricle: Systolic function was moderately reduced. - Pulmonary arteries: Systolic pressure was mildly increased. - Pericardium, extracardiac: A trivial pericardial effusion was   identified.   Impressions:   - No subcostal views; severe global reduction in LV systolic   function; grade 1 diastolic dysfunction; mildly dilated aortic   root and ascending aorta; moderate MR; moderaet LAE; moderately   reduced RV function; mild TR; mildly elevated pulmonary pressure.  Procedures   Right/Left Heart Cath and Coronary Angiography  Conclusion     Ost 1st Mrg to 1st Mrg lesion, 75 %stenosed. 2nd Mrg lesion, 90 %stenosed. Ost RCA to Mid RCA lesion, 100 %stenosed. There is severe left ventricular systolic dysfunction. LV end diastolic pressure is normal. The left ventricular ejection fraction is 25-35% by visual estimate. Hemodynamic findings consistent with mild pulmonary hypertension. LV end diastolic pressure is normal.   1. Severe 2 vessel obstructive CAD    - 75% proximal OM1    - 90% small OM2    - 100% proximal RCA. Left to right collaterals.  2. Severe LV dysfunction 3. Mild pulmonary HTN with normal LV filling pressures.  4. Cardiac index 2.41 L/min/BSA     Plan: Medical management to try and optimize CHF therapy. Patient appears to be adequately diuresed at this time. Based on these results her cardiomyopathy is ischemic. I would treat her CAD medically. If cardiac cath is  needed in the future would consider alternative access given difficulty from the right radial approach. Her rhythm during procedure is a multifocal atrial rhythm/tachycardia.      Echo 07/08/17: Study Conclusions   - Left ventricle: The cavity size was normal. There was moderate   concentric hypertrophy. Systolic function was normal. The   estimated ejection fraction was in the range of 50% to 55%.   Severe hypokinesis of the basal-midinferior myocardium;   consistent with infarction in the distribution of the right   coronary artery. Doppler parameters are consistent with abnormal   left ventricular relaxation (grade 1 diastolic dysfunction). - Mitral valve: Calcified annulus.   Impressions:   - Compared to April 2018 there is marked improvement in contraction   of all LV wall segemnts except for the inferior wall, which   remains severely hypokinetic. There is also  marked reduction in   the severity of mitral insufficiency.    Assessment:    1. PAF (paroxysmal atrial fibrillation) (HCC)   2. Chronic systolic CHF (congestive heart failure) (HCC)   3. PAD (peripheral artery disease) (HCC)   4. Essential hypertension   5. Pure hypercholesterolemia       Plan:   In order of problems listed above:  1. Chronic Combined Systolic and Diastolic CHF/  Secondary to ischemic/tachycardia mediated Cardiomyopathy - EF 25-30% in 2018 with ischemic cardiomyopathy. Not a candidate for revascularization. With medical management EF improved to 50-55% by Echo.  - she is euvolemic at this time. Weight is down 4 lbs.  - continue BB, aldactone, and statin. ARB discontinued due to orthostatic dizziness.  2. Paroxysmal Atrial Fibrillation/ multifocal atrial tachycardia - This patients CHA2DS2-VASc Score and unadjusted Ischemic Stroke Rate (% per year) is equal to 9.7 % stroke rate/year from a score of 6 (CHF, HTN, Female, Age, CVA (2)). She denies any evidence of active bleeding. Continue Coumadin  for anticoagulation. INR therapeutic today 2.9  3. HTN- controlled.   4. COPD/Emphysema- - followed by Pulmonology.   5. PFO - no plans for closure   6. PAD s/p covered stenting of left iliac. Stenosis in distal aorta and right femoral not amenable to percutaneous therapy. Left subclavian stenosis. Claudication is stable. Followed by Dr Allyson Sabal. Dopplers in May stable.   7. CAD. 100% RCA. 75% OM. Chronic stable angina class 1-2.  No active angina. Rarely uses Ntg.   8. S/p right retinal artery occlusion. Plavix added to coumadin due to severe vascular disease. I feel benefit of Plavix/coumadin combination outweighs risk of bleeding at this time.  9. Old CVAs noted on MRI  10. Hypercholesterolemia. LDL 116 on maximally tolerated statin. Now on Praluent. LDL improved to 52. Last LDL 67.   11. Carotid arterial disease. No new Neurologic symptoms. Dopplers in January 2022 were unchanged. Follow up with Dr Allyson Sabal in January.   12. 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 Reassured her about this. At most will follow up CT in one year.    I will follow up in 6 months   Signed, Tamura Lasky [place], MD  10/17/2021 10:13 AM    [facility] [address] [place], [place] [address] Phone: [phone]

## 2021-10-17 ENCOUNTER — Ambulatory Visit (INDEPENDENT_AMBULATORY_CARE_PROVIDER_SITE_OTHER): Payer: Medicare Other

## 2021-10-17 ENCOUNTER — Encounter: Payer: Self-pay | Admitting: Cardiology

## 2021-10-17 ENCOUNTER — Other Ambulatory Visit: Payer: Self-pay

## 2021-10-17 ENCOUNTER — Ambulatory Visit (INDEPENDENT_AMBULATORY_CARE_PROVIDER_SITE_OTHER): Payer: Medicare Other | Admitting: Cardiology

## 2021-10-17 VITALS — BP 124/66 | HR 54 | Ht 64.0 in | Wt 178.2 lb

## 2021-10-17 DIAGNOSIS — Z7901 Long term (current) use of anticoagulants: Secondary | ICD-10-CM | POA: Diagnosis not present

## 2021-10-17 DIAGNOSIS — I480 Paroxysmal atrial fibrillation: Secondary | ICD-10-CM | POA: Diagnosis not present

## 2021-10-17 DIAGNOSIS — I739 Peripheral vascular disease, unspecified: Secondary | ICD-10-CM | POA: Diagnosis not present

## 2021-10-17 DIAGNOSIS — I5022 Chronic systolic (congestive) heart failure: Secondary | ICD-10-CM

## 2021-10-17 DIAGNOSIS — I10 Essential (primary) hypertension: Secondary | ICD-10-CM

## 2021-10-17 DIAGNOSIS — E7800 Pure hypercholesterolemia, unspecified: Secondary | ICD-10-CM

## 2021-10-17 LAB — POCT INR: INR: 2.9 (ref 2.0–3.0)

## 2021-10-17 NOTE — Patient Instructions (Signed)
Continue taking 1/2 a tablet daily, except 1 tablet on Wednesday. Recheck INR in 6 weeks. Coumadin Clinic [phone].

## 2021-10-29 ENCOUNTER — Encounter: Payer: Self-pay | Admitting: Internal Medicine

## 2021-10-29 NOTE — Telephone Encounter (Signed)
Understand her frustration with his general shortage of manufacturing supplies and even DME companies are having to use previous equipment.  As stated in the prior message she should look at paying for a new machine out-of-pocket.  Some patients have been more satisfied with this approach

## 2021-10-29 NOTE — Telephone Encounter (Signed)
FYI for MR 

## 2021-10-29 NOTE — Telephone Encounter (Signed)
MR please advise. Thanks! 

## 2021-10-29 NOTE — Telephone Encounter (Signed)
Yes if she does not want to do autopay with the DME company because of concerns of identity theft.  And she is concerned about quality of the machine certainly if she wants to try out-of-pocket directly from the company she should.  Several patients have been more satisfied with this approach

## 2021-11-13 ENCOUNTER — Ambulatory Visit (INDEPENDENT_AMBULATORY_CARE_PROVIDER_SITE_OTHER): Payer: Medicare Other | Admitting: Internal Medicine

## 2021-11-13 ENCOUNTER — Encounter: Payer: Self-pay | Admitting: Internal Medicine

## 2021-11-13 ENCOUNTER — Other Ambulatory Visit: Payer: Self-pay

## 2021-11-13 VITALS — BP 126/78 | HR 74 | Temp 97.9°F | Ht 64.0 in | Wt 179.4 lb

## 2021-11-13 DIAGNOSIS — J449 Chronic obstructive pulmonary disease, unspecified: Secondary | ICD-10-CM | POA: Diagnosis not present

## 2021-11-13 NOTE — Patient Instructions (Addendum)
COPD, severe ([place]) COPD frequent exacerbation    - stable after reccnt flare up few weeks ago  - cxr oct 2022 is clear - lung exam 11/13/2021 Is clear  -  - noted continued need for night o2 ad you are lookng at getting pure o2 100 gallon tank from patio to inside hose to run tube up and down  Plan -continue YUPELRI nebulizer once daily - use 2.5L o2 at night and daytime continuous --Continue Helios portable system that you fill from tank outside in patio    Follow-up - 24 weeks with  DR [name]

## 2021-11-13 NOTE — Progress Notes (Signed)
OV 05/19/2016  Chief Complaint  Patient presents with   Follow-up    Pt c/o worsening SOB, prod cough with thick white mucus.      This is a routine follow-up. Last visit was in April 2017 with nurse practitioner. At that time treated for COPD exacerbation according to her history but review of the chart does not show that to be true. At this point in time she says COPD stable although she says she might be in flare up on account of her fibromyalgia. Starting her symptoms out it appears that it is generally stable with dyspnea at baseline and cough with mild sputum at baseline. She is more hobbled by her chronic pain and fibromyalgia. Couple months ago apparently a hydrocodone was discontinued by rheumatology and therefore she is having "withdrawal". She does have a new pain medication physician. She is on gabapentin for fibromyalgia. There are no other new issues. She does not want antibiotics or prednisone for her perceived exacerbation  OV 11/17/2016  Chief Complaint  Patient presents with   Follow-up    Pt states her SOB has worsened since last OV. Pt states she has a burning in her chest when she becomes SOB. Pt c/o prod cough with white mucus in morning, cough becomes nonprod throughout the day.     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 Last CT chest March 2017 without any mass and associated mild cor pulmonale   Six-month follow-up visit. She is completely overwhelmed by her fibromyalgia and depression. The fibromyalgia is worse. She says she is change doctors because of this. She's had a few admissions in the interim but none of them give a COPD exacerbation according to chart review. She suffered frustrated by her heels oxygen system even though it is light it is causing her pain. She uses Atrovent nebulizer but wants change to something else but at the same time has rejected use of any other  nebulizer or oral inhaler because of side effects. She is burning chest pain with inspiration and associated with her costochondral junction trigger points. 2014 review of the chart shows normal cardiac stress test. November 2017 chest x-ray is clear. She does not want any further imaging. Chest pain is mild to severe and variable. Worsened with inspiration. No radiation associated wheezing. No sputum production  OV 05/25/2017  Chief Complaint  Patient presents with   Follow-up    Pt states her SOB has worsened since last OV in 11/2016. Pt states she only coughs after her neb treatment - pt states her mucus is yellow in color and c/o occ chest discomfort. Pt denies f/c/s.     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 Last CT chest March 2017 without any mass and associated mild cor pulmonale   Follow-up exertional hypoxemia associated with his emphysema. Also associated with fibromyalgia. Last visit she had atypical chest pain. Recommended she see cardiology. 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 According to her history she has significant coronary artery disease but is fairly advanced. She is frustrated with this but realistic. She feels her days are number. In terms of COPD emphysema and is stable. She is on Atrovent inhalers. She is on oxygen. She wants a lighter system. She still burden by fibromyalgia. 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  She uses oxygen and nebulizers.  She is not interested in rehab or vaccines.  New issue: Preoperative pulmonary evaluation.  She is having significant bilateral lower extremity claudication.  She states that she is in severe pain walking from her door to the mailbox to the point she is almost crying.  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  Apparently Dr. Trula Slade wants to try a laparotomy approach.  However Dr. Broadus John might take her to the cardiac Cath Lab and placed stents.  She says she is sensitive to fentanyl and is worried about anesthesia complications but the pain is so severe she is willing to take the risk.  She has had previous cardiac catheterization without any problems other than being sensitive to fentanyl.  She says this can be done in the cardiac Cath Lab with anesthesia support.  She wants me to talk to Dr. Betsy Coder and Dr. Trula Slade about this.   OV 07/18/2018  Subjective:  Patient ID: [name], female , DOB: [birth year] , age 79 y.o. , MRN: [id number] , ADDRESS: [address] [place] [address]   07/18/2018 -   Chief Complaint  Patient presents with   Follow-up    Pt states her chest is hurting her all the time now and states she does not think her neb solutions are working for her anymore now. Pt also states she has had some worsening SOB and is also coughing up white phlegm which is comes out in chunks.     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  She uses oxygen and nebulizers. Last CT chest March 2017 without any mass and associated mild cor pulmonale   HPI TATJANA TURCOTT 76 y.o. -after last visit she saw  nurse practitioner in June 2019 fora mild respiratory flare. At the time treated with allergy medications antihistamines.She tells me thathe last saw me January 2019 she had an iliac stent place in the left side and after that her effort tolerance is improved but in the last few months she's noticed a decrease in effort tolerance with worsening dyspnea and also increased cough and increased sputum production in volume and also consistency without change in color This no fever or weight loss. She will not have a flu shot because of prior allergy. Her last CT scan of the chest was in 2017 and she is requesting for another one. Her inhaler as ipratropium which she says she's not happy with. In the past she's uses Spiriva and Symbicort and these have caused blisters and so she is generally where you have inhalers although she wants something other than her current one.       08/19/2018  - Visit   [name].  Patient was last seen in our office visit  on 07/18/2018 started on Bevespi.  Patient reports that after 1 day of use of the Bevespi inhaler she developed thrush and mouth sores.  She she contacted our office to be treated with nystatin.  Patient reports that most mouth sores resolved there remains one.  Patient is also having extensive dental work done.  Patient also has been treated for scabies as well as impetigo by dermatology recently.   Patient completed a high-res CT in 07/29/2018 that showed no real changes from baseline.  Still showing severe emphysema.   Patient reports that she continues to use her Atrovent nebulizer but is wondering if there is any other options available for her.  Patient feels that the Atrovent nebulizer is not working as well.  Patient is currently using as needed and not using it scheduled.  MMRC - Breathlessness Score 3 - I stop for breath after walking about 100 yards or after a few minutes on level ground (isle at grocery  store is 14f)  Patient reports that she has known triggers of shortness of breath with exertion, when there is high pollen counts, when she is walking, when she is outside for extended periods of time.  Patient reports she is been using 2 L via nasal cannula of oxygen with exertion as well as at rest.  Patient reports she forgot her POC at home.  She arrived to our office on room air.  Oxygen saturations 87.  Patient refused oxygen in our office and states that she does not think she needs oxygen right now.  Patient is sad and concerned regarding her limitations with vascular surgery.  Patient believes that she needs a surgery but reports that vascular team as well as cardiology does not think that she would be a good surgical candidate.  She reports that she has not heard back from Dr. BNaida Sleightoffice regarding her most recent test results.   Tests:  01/21/2016-CT chest without contrast- moderate centrilobular emphysema and diffuse bronchial wall thickening mild subpleural density in the dependent lower lobes  01/20/2016-pulmonary function test- airway obstruction and diffusion defect suggesting emphysema  Imaging:  02/11/2017-chest x-ray-stable large cardiac silhouette, lungs are hyperinflated, interstitial edema pattern unchanged from prior scans   Cardiac:  07/08/2017-echocardiogram-LV ejection fraction 50 to 554% grade 1 diastolic dysfunction      OV 12/21/2018  Subjective:  Patient ID: KArmen Pickup female , DOB: 201/23/1947, age 76y.o. 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  She uses oxygen and nebulizers. Last CT chest March 2017 without any mass and associated mild cor pulmonale  12/21/2018 -   Chief Complaint  Patient presents with   Follow-up    Pt states due to being switched to a  new medication, she has had labored breathing. SOB is with exertion, has an occ cough with white phlegm, and also has had some occ CP.     HPI KKIMEKA BADOUR89y.o. -  Presents for routine follow-up.  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  She uses oxygen sporadically and nebulizer sporadically.  She is very afraid of medicines because of multiple allergies.  She says her psychiatrist Dr. AMelissa Montaneplaced her on some medications that caused a rash.  Other than that she is okay.  OV 09/15/2019  Subjective:  Patient ID: [name], female , DOB: 09-25-1946 , age 7 y.o. 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  She uses oxygen and nebulizers. Last CT chest March 2017 without any mass and associated mild cor pulmonale   09/15/2019 -  No chief complaint on file.    HPI JODETTE WIK 76 y.o. -     ROS - per HPI     OV 01/17/2020  Subjective:  Patient ID: [name], female , DOB: 10-10-1946 , age 27 y.o. , MRN: [id number] , ADDRESS: [address] [place] [address]   01/17/2020 -   Chief Complaint  Patient presents with   Follow-up     HPI NAISHA WISDOM 76 y.o. -presents for face-to-face follow-up.  In December she called with a COPD flareup symptoms.  She wanted Biaxin despite her allergies.  She feels that Biaxin helps her.  She says she was given prednisone which helped but she was only given generic clarithromycin instead of the tradename Biaxin.  She says it did not work.  She wants tradename Biaxin only.  She says since then her cough is more than baseline.  She also is like sputum that is discolored.  She feels like she is in exacerbation and this is preventing her from getting the COVID-19  vaccine.  She feels another course of tradename Biaxin is required.  She again does not want generic clarithromycin.  She is compliant with her baseline nebulizer nighttime oxygen.  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  She is now recovering from that.  She talked about Covid vaccine.  She wants to get it.  He has had pneumonia vaccine without problem but recently flu shot she feels this put her in the hospital.  She also has multiple oral drug allergies.  Overall she continues to mask and follow social distancing.       CAT COPD Symptom & Quality of Life Score (GSK trademark) 0 is no burden. 5 is highest burden 07/18/2018  12/21/2018  01/17/2020   Never Cough -> Cough all the time '3 3 3  ' No phlegm in chest -> Chest is full of phlegm '5 3 4  ' No chest tightness -> Chest feels very tight '4 3 3  ' No dyspnea for 1 flight stairs/hill -> Very dyspneic for 1 flight of stairs '5 3 5  ' No limitations for ADL at home -> Very limited with ADL at home '5 4 3  ' Confident leaving home -> Not at all confident leaving home 0 1 2  Sleep soundly -> Do not sleep soundly because of lung condition '3 4 3  ' Lots of Energy -> No energy at all '5 4 4  ' TOTAL Score (max 40)  '30 25 27   ' No flowsheet data found.    12/19/2020 -   Chief Complaint  Patient presents with   Follow-up    Had Covid PNA 10/2020, doing some better     HPI SHANITRA PHILLIPPI 76 y.o. -presents for follow-up of her COPD.  She continues to use Atrovent nebulizer.  She prefers nebulizers of inhaler.  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  She uses oxygen at night with exertion.  She tells me that in  December 2021 around Christmas she had respiratory viral symptoms.  She says rapid antigen test by her daughter was negative.  Then in January 2022 she followed up with primary care physician who did a chest x-ray that showed pneumonia.  I have the chest x-ray with me  visualized it.  There is an infiltrate.  She says she was told that she had Covid but there is no confirmatory evidence for this.  She treated herself at home.  She cannot have vaccines because of multiple drug allergies according to history.  I supported her in the decision making.  She is worried about abnormal chest x-ray.        CAT Score 12/19/2020  Total CAT Score 18       OV 01/30/2021  Subjective:  Patient ID: [name], female , DOB: Dec 27, 1945 , age 13 y.o. , MRN: [id number] , ADDRESS: [address], [place] [address] PCP [name] Agreste, MD Patient Care Team: [name] Agreste, MD as PCP - General (Family Medicine) Lorretta Harp, MD as PCP - Cardiology (Cardiology) Margaretha Sheffield, MD as Referring Physician (Physical Medicine and Rehabilitation) Laurence Spates, MD (Inactive) as Consulting Physician (Gastroenterology) Garvin Fila, MD as Consulting Physician (Neurology) Martin[place], Peter M, MD as Consulting Physician (Cardiology) Brand Males, MD as Consulting Physician (Pulmonary Disease) Adele Schilder Arlyce Harman, MD as Consulting Physician (Psychiatry) Lorretta Harp, MD as Consulting Physician (Peripheral Vascular Disease)  This Provider for this visit: Treatment Team:  Attending Provider: Brand Males, MD    01/30/2021 -   Chief Complaint  Patient presents with   Follow-up    Doing ok, breathing is the same     HPI [name] 76 y.o. -presents for follow-up for COPD.  Here to review the results.  Her daughter Kelin Nixon is on the phone.  No new interim complaints.  A Covid IgG is negative thus making her recent viral infection is unlikely as Covid.  I offered a referral for Covid monoclonal antibody prophylaxis but she declined.  Her pulmonary function test shows 12% decline in FEV1 in 5 years.  I reviewed the chart and do not find an alpha-1 check.  I presume this was done prior to [place] using electronic medical records.  She  is open to getting it tested again.  She is to go since 2 COPD.  She tells me that the DME company is out of supply with tubing for her nebulizer.  She has used an inhaler but she is somewhat skeptical because of nasal septal perforation.  Explained to her inhalers oral.  She then talked about dentures.  Explained that we can do inhalers through an [facility].  She had high-resolution CT chest.  This shows emphysema.  There is evidence of cor pulmonale.  There is stable thoracic aorta aneurysm.  No fibrosis no cancer.   CAT Score 01/30/2021 12/19/2020  Total CAT Score 10 18        Ref Range & Units 1 mo ago  SARS COV1 AB(IGG)SPIKE,SEMI QN <1.00 index <1.00         IMPRESSION: 1. No evidence of interstitial lung disease. Air trapping is indicative of small airways disease. 2. Ascending aortic aneurysm, stable. Recommend annual imaging followup by CTA or MRA. This recommendation follows 2010 ACCF/AHA/AATS/ACR/ASA/SCA/SCAI/SIR/STS/SVM Guidelines for the Diagnosis and Management of Patients with Thoracic Aortic Disease. Circulation. 2010; 121: O709-G283. Aortic aneurysm NOS (ICD10-I71.9). 3. Aortic atherosclerosis (ICD10-I70.0). Coronary artery calcification. 4. Enlarged pulmonic trunk, indicative of  pulmonary arterial hypertension. 5.  Emphysema (ICD10-J43.9).     Electronically Signed   By: Lorin Picket M.D.   On: 01/15/2021 11:52     OV 07/22/2021  Subjective:  Patient ID: [name], female , DOB: [birth year] , age 86 y.o. , MRN: [id number] , ADDRESS: [address] [place] [address] PCP [name] Agreste, MD Patient Care Team: [name] Agreste, MD as PCP - General (Family Medicine) Martin[place], Peter M, MD as PCP - Cardiology (Cardiology) Margaretha Sheffield, MD as Referring Physician (Physical Medicine and Rehabilitation) Laurence Spates, MD (Inactive) as Consulting Physician (Gastroenterology) Garvin Fila, MD as Consulting Physician  (Neurology) [place], Peter M, MD as Consulting Physician (Cardiology) Brand Males, MD as Consulting Physician (Pulmonary Disease) Adele Schilder Arlyce Harman, MD as Consulting Physician (Psychiatry)  This Provider for this visit: Treatment Team:  Attending Provider: Brand Males, MD    07/22/2021 -   Chief Complaint  Patient presents with   Follow-up    Pt states she had pneumonia since last visit. States she went to see PCP after finishing meds and was told she still had infection and was placed back on abx by PCP.   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  She uses oxygen and nebulizer  HPI ALEXIA DINGER 76 y.o. -returns for follow-up.  On 06/18/2021 she called with COPD exacerbation symptoms.  Gave her Biaxin and prednisone.  But she tells me that because the line was busy our office did not call in the medications till 5 days later when they got hold of her.  She says she is only partially improved and she is her primary care physician.  She had chest x-ray 07/16/2021.  This reports of interstitial prominence.  However there is no mass or consolidation.  She had basic labs that I reviewed and it is normal.  Apparently primary care physician gave her another Biaxin.  But there is no prednisone.  She still has yellow symptoms.  Symptoms only partially resolved.  She feels another round of prednisone will help her.  Of note we put her on triple inhaler BREZTRI last visit but this gave her thrush.  She is taking ipratropium.  If she only takes it 3 times a day when she has tremors that she is managing just with 2 times a day and this is insufficient.  She is using oxygen continuous now up to 2-1/2 L.  She is now moved downstairs because of her shortness of breath.  She is wanting oxygen concentrator upstairs and downstairs.  In the last few to several years she is refused flu shot.   She will not have the COVID-vaccine either.      OV 08/21/2021  Subjective:  Patient ID: [name], female , DOB: Jan 19, 1946 , age 77 y.o. , MRN: [id number] , ADDRESS: [address], [place] [address] PCP [name] Agreste, MD Patient Care Team: [name] Agreste, MD as PCP - General (Family Medicine) [place], Peter M, MD as PCP - Cardiology (Cardiology) Margaretha Sheffield, MD as Referring Physician (Physical Medicine and Rehabilitation) Laurence Spates, MD (Inactive) as Consulting Physician (Gastroenterology) Garvin Fila, MD as Consulting Physician (Neurology) [place], Peter M, MD as Consulting Physician (Cardiology) Brand Males, MD as Consulting Physician (Pulmonary Disease) Adele Schilder Arlyce Harman, MD as Consulting Physician (Psychiatry)  This Provider for this visit: Treatment Team:  Attending Provider: Brand Males, MD  Follow-up  emphysema with chronic hypoxemic restorative failure and associated fibromyalgia and associated chronic systolic heart failure: Overall doing well.  She uses oxygen and nebulizer  08/21/2021 -   Chief Complaint  Patient presents with   Follow-up    Pt was at the ED 10/17 after PCP sent her there. States that she has been using Yupelri neb sol. States that it does make her feel dizzy but has helped with her breathing better than prior neb sol.     HPI ARISSA FAGIN 76 y.o. -returns for follow-up to have a follow-up chest x-ray because of his concerns of interstitial edema versus atypical pneumonia at last visit.  I look to the follow-up chest x-ray.  Personal visualization shows it is unchanged.  I think this is a baseline based on the fact his CT scan of the chest in March 2022 look very similar.  She is feeling stable.  Also last visit we put her on Yupelri nebulizer.  Part of this follow-up was to see how she is doing with that.  She says it makes her dizzy but then after a while she is able to tolerate it.  So she is careful  and some days she does not use it.  But overall she feels its more beneficial than ipratropium.  Therefore she wants to stick with it.  Of note on 08/18/2021 she did have a video visit with her psychiatrist for depression.  She also has had a skin biopsy in her left thigh and apparently this was infected and she is sent to the ED for IV antibiotics but there was 11-hour wait and she left AMA without being seen.  She is very upset about her experience.  She is reflecting on it and is wondering whether she should go to the medical board and file a formal complaint.  But overall from a respiratory standpoint she is stable.    The other issues that she wants to oxygen concentrator's.  1 5 stairs and 1 for downstairs.  Last visit I thought I put an order in but she says at that never got it.  She feels service at [facility] is terrible.  I informed her that we will do the order again.          OV 11/13/2021  Subjective:  Patient ID: [name], female , DOB: [birth year] , age 20 y.o. , MRN: [id number] , ADDRESS: [address], [place] [address] PCP Merrilee Seashore, MD Patient Care Team: Merrilee Seashore, MD as PCP - General (Internal Medicine) [place], Peter M, MD as PCP - Cardiology (Cardiology) Margaretha Sheffield, MD as Referring Physician (Physical Medicine and Rehabilitation) Laurence Spates, MD (Inactive) as Consulting Physician (Gastroenterology) Garvin Fila, MD as Consulting Physician (Neurology) [place], Peter M, MD as Consulting Physician (Cardiology) Brand Males, MD as Consulting Physician (Pulmonary Disease) Adele Schilder Arlyce Harman, MD as Consulting Physician (Psychiatry)  This Provider for this visit: Treatment Team:  Attending Provider: Brand Males, MD    11/13/2021 -   Chief Complaint  Patient presents with   Follow-up    Pt states her breathing has become a little worse since last visit. States she has had a virus for the past 10 days.    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  She uses oxygen and nebulizer  HPI LIVI MCGANN 76 y.o. -returns for follow-up.  Is a 55-monthroutine follow-up.  She tells  me that some of her neighbors got sick with the flu.  She had flulike symptoms which she is flu negative.  She was in bed for a week.  She felt it was mild.  She did not call us.  She is back to baseline now.  Overall she is stable.  She tells me that her biggest issue is the portable oxygen.  She says Medicare refused to give her 2 systems 1 5 stairs and 1 for downstairs.  Currently she has a Haematologist inside the house that she plugs and uses it at night.  For exertion she uses a Helio system.  She feels that he does system using 100 pound.  Oxygen gallon tank that is outside on the patio.  She wants to bring this inside and then the DME company will run her house for upstairs and downstairs.  She says when she goes into the system the DME company says she cannot revert back to her concentrator.  She is a little bit nervous about this approach with the biggest hold-up is that the DME company is asking for credit card information and she is worried about identity theft.  And price gouging    CT Chest data  No results found.    PFT  PFT Results Latest Ref Rng & Units 01/30/2021 01/20/2016 08/14/2013  FVC-Pre L 2.61 2.79 -  FVC-Predicted Pre % 92 93 100  FVC-Post L - - 3.07  FVC-Predicted Post % - - 103  Pre FEV1/FVC % % 63 67 62  Post FEV1/FCV % % - - 64  FEV1-Pre L 1.63 1.86 1.84  FEV1-Predicted Pre % 77 82 82  FEV1-Post L - - 1.97  DLCO uncorrected ml/min/mmHg 9.45 9.75 11.57  DLCO UNC% % 49 40 50  DLCO corrected ml/min/mmHg 9.45 - -  DLCO COR %Predicted % 49 - -  DLVA Predicted % 57 51 58  TLC L 4.59 4.62 4.32  TLC % Predicted % 90 91 88  RV % Predicted % 80 78 77        has a past medical history of Cataract, CHF (congestive heart failure) ([place]) (85/12/7739), Complication of anesthesia, COPD (chronic obstructive pulmonary disease) ([place]), Depression, Depression, Diverticulitis, DVT (deep venous thrombosis) ([place]), Fibromyalgia, GERD (gastroesophageal reflux disease), Heart murmur, Hiatal hernia, History of deviated nasal septum, HTN (hypertension), Hyperlipidemia, Hypertension, Hypertensive retinopathy, Myocardial infarction ([place]), Obesity, On supplemental oxygen therapy, Osteoporosis, Osteoporosis, Oxygen deficiency, PAT (paroxysmal atrial tachycardia) ([place]), PFO (patent foramen ovale), PULMONARY NODULE, LEFT LOWER LOBE (10/14/2009), PVD (peripheral vascular disease) with claudication ([place]) (12/2017), Right middle lobe pneumonia (07/24/2011), Stroke ([place]), and TOBACCO ABUSE (06/04/2009).   reports that she quit smoking about 11 years ago. Her smoking use included cigarettes. She has a 90.00 pack-year smoking history. She has never used smokeless tobacco.  Past Surgical History:  Procedure Laterality Date   ABDOMINAL HYSTERECTOMY N/A    Phreesia 11/04/2020   CARDIOVASCULAR STRESS TEST  12/26/2004   EF 74%. NO EVIDENCE OF ISCHEMIA   CATARACT EXTRACTION Left    Dr. Elliot Dally   ESOPHAGOGASTRODUODENOSCOPY (EGD) WITH PROPOFOL N/A 04/15/2015   Procedure: ESOPHAGOGASTRODUODENOSCOPY (EGD) WITH PROPOFOL;  Surgeon: Laurence Spates, MD;  Location: [facility];  Service: Endoscopy;  Laterality: N/A;   EYE SURGERY Left    Cat Sx   JOINT REPLACEMENT N/A    Phreesia 11/04/2020   KNEE ARTHROSCOPY  2000   left   LAPAROSCOPIC CHOLECYSTECTOMY  04-16-2010   [facility]   LOWER EXTREMITY ANGIOGRAPHY N/A 09/09/2017  Procedure: Lower Extremity Angiography;  Surgeon: Lorretta Harp, MD;  Location: [facility];  Service: Cardiovascular;  Laterality: N/A;   LOWER EXTREMITY INTERVENTION Left 01/17/2018   Procedure: LOWER EXTREMITY INTERVENTION;  Surgeon: Lorretta Harp, MD;  Location:  [facility];  Service: Cardiovascular;  Laterality: Left;   MOUTH SURGERY     03-26-15 multiple extractions stitches remains   PERIPHERAL VASCULAR INTERVENTION Left 01/17/2018   Procedure: PERIPHERAL VASCULAR INTERVENTION;  Surgeon: Lorretta Harp, MD;  Location: [facility];  Service: Cardiovascular;  Laterality: Left;  COMMON ILIAC   RIGHT/LEFT HEART CATH AND CORONARY ANGIOGRAPHY N/A 03/04/2017   Procedure: Right/Left Heart Cath and Coronary Angiography;  Surgeon: Peter M Martinique, MD;  Location: [facility];  Service: Cardiovascular;  Laterality: N/A;   TOTAL ABDOMINAL HYSTERECTOMY     post op needed oxygen was told "she gave them a scare"   TUBAL LIGATION     US ECHOCARDIOGRAPHY  11/20/2009   EF 55-60%    Allergies  Allergen Reactions   Alprazolam Anaphylaxis and Other (See Comments)    REACTION: stops breathing   Bee Venom Anaphylaxis   Iodine Anaphylaxis, Swelling and Other (See Comments)    REACTION: swelling in throat   Pseudoephedrine Hcl Er Shortness Of Breath   Budesonide-Formoterol Fumarate Other (See Comments)    Blisters inside of mouth all over   Crestor [Rosuvastatin Calcium] Other (See Comments)    Unable to walk   Esomeprazole Magnesium Other (See Comments)    REACTION: "bouncing off walls"   Flonase [Fluticasone Propionate] Other (See Comments)    NOSE BLEED   Lamictal [Lamotrigine] Rash    Patient got rash, labored breathing, and diarrhea   Loratadine Other (See Comments)    claritin D causes shaking   Lotrimin [Clotrimazole] Other (See Comments)    Mouth blisters   Lunesta [Eszopiclone] Other (See Comments)    REACTION: "slept for a week"   Oxcarbazepine Other (See Comments)    Causes deep sleep and dizziness   Statins Other (See Comments)    Can't walk, legs won't work    Zolpidem Tartrate Other (See Comments)    REACTION: "slept for a week"   Betadine [Povidone Iodine] Other (See Comments)    Breathing problems   Bevespi Aerosphere  [Glycopyrrolate-Formoterol] Other (See Comments)    Pt believes this caused mouth sores and thrush    Breztri Aerosphere [Budeson-Glycopyrrol-Formoterol] Other (See Comments)    Thrush   Clarithromycin Other (See Comments)    All "mycins", Puts into "a" fib, Will take if has to for severe sinus infection   Effexor [Venlafaxine] Nausea And Vomiting and Other (See Comments)    cramps   Lexapro [Escitalopram Oxalate] Other (See Comments)    hallucinations   Aciphex [Rabeprazole Sodium] Rash   Alendronate Sodium Other (See Comments)    "caused stomach problems for 3 days"   Avelox [Moxifloxacin Hcl In Nacl] Other (See Comments)    Stomach cramps.    Bextra [Valdecoxib] Rash   Ceclor [Cefaclor] Rash   Cephalexin Rash and Other (See Comments)    Pt states that she is possibly allergic to this - had a reaction to Cefaclor in the past and she does not want to these class drugs. Added per patient request.   Covera-Hs [Verapamil Hcl] Palpitations   Dicyclomine Hcl Rash   Other Other (See Comments)    Glue from ekg/heart monitor leads --rash, Any MYCINS   Tessalon Perles Rash  Immunization History  Administered Date(s) Administered   Influenza Split 08/03/2011, 08/01/2012   Influenza,inj,Quad PF,6+ Mos 08/14/2013, 09/03/2014, 09/04/2015, 08/18/2016   Influenza-Unspecified 11/03/1999   Pneumococcal Conjugate-13 10/01/2014   Pneumococcal Polysaccharide-23 09/03/1999, 06/02/2006, 08/14/2013   Td 11/02/1994   Tdap 08/18/2016    Family History  Problem Relation Age of Onset   Dementia Mother    Diabetes Mother    Alzheimer's disease Mother    Heart attack Brother 63   Heart attack Father    Schizophrenia Sister    Diabetes Sister    Tremor Sister      Current Outpatient Medications:    carvedilol (COREG) 12.5 MG tablet, TAKE 1 TABLET BY MOUTH TWICE A DAY, Disp: 180 tablet, Rfl: 3   clopidogrel (PLAVIX) 75 MG tablet, TAKE 1 TABLET BY MOUTH EVERY DAY, Disp: 30 tablet, Rfl: 11    desvenlafaxine (PRISTIQ) 50 MG 24 hr tablet, Take 1 tablet (50 mg total) by mouth daily., Disp: 30 tablet, Rfl: 2   diclofenac sodium (VOLTAREN) 1 % GEL, APPLY 2 GRAMS TO BOTH HANDS 4 TIMES A DAY, Disp: , Rfl: 5   Eyelid Cleansers (OCUSOFT BABY EYELID & EYELASH EX), Apply topically., Disp: , Rfl:    furosemide (LASIX) 40 MG tablet, TAKE 1 TABLET BY MOUTH EVERY DAY, Disp: 30 tablet, Rfl: 11   gabapentin (NEURONTIN) 100 MG capsule, Take 200 mg by mouth See admin instructions. Take 1 in am and 1 in the lunch and 2 at bedtime, Disp: , Rfl:    HYDROcodone-acetaminophen (NORCO/VICODIN) 5-325 MG tablet, Take 1 tablet by mouth 3 (three) times daily., Disp: , Rfl:    montelukast (SINGULAIR) 10 MG tablet, TAKE 1 TABLET BY MOUTH EVERYDAY AT BEDTIME, Disp: 90 tablet, Rfl: 2   Multiple Vitamins-Minerals (VITAMIN D3 COMPLETE PO), Take by mouth., Disp: , Rfl:    nitroGLYCERIN (NITROSTAT) 0.4 MG SL tablet, PLACE 1 TABLET UNDER THE TONGUE EVERY 5 MINUTES AS NEEDED FOR CHEST PAIN, Disp: 25 tablet, Rfl: 3   Omega-3 Fatty Acids (FISH OIL) 1000 MG CAPS, Take by mouth daily., Disp: , Rfl:    ondansetron (ZOFRAN-ODT) 8 MG disintegrating tablet, Take 1 tablet (8 mg total) by mouth every 8 (eight) hours as needed for nausea or vomiting., Disp: 20 tablet, Rfl: 0   oxybutynin (DITROPAN-XL) 10 MG 24 hr tablet, Take 1 tablet (10 mg total) by mouth daily., Disp: 90 tablet, Rfl: 2   OXYGEN, Inhale 1.5-3 L into the lungs as needed (for shortness of breath)., Disp: , Rfl:    pantoprazole (PROTONIX) 40 MG tablet, TAKE 1 TABLET BY MOUTH EVERY DAY, Disp: 90 tablet, Rfl: 1   polyethylene glycol (MIRALAX / GLYCOLAX) packet, Take 17 g by mouth daily as needed for mild constipation. , Disp: , Rfl:    PRALUENT 75 MG/ML SOAJ, INJECT 75 MG INTO THE SKIN EVERY 14 (FOURTEEN) DAYS., Disp: 2 mL, Rfl: 11   revefenacin (YUPELRI) 175 MCG/3ML nebulizer solution, Take 3 mLs (175 mcg total) by nebulization daily., Disp: 90 mL, Rfl: 11   spironolactone  (ALDACTONE) 25 MG tablet, TAKE 1 TABLET BY MOUTH EVERY DAY, Disp: 90 tablet, Rfl: 3   traZODone (DESYREL) 100 MG tablet, Take one tab daily as needed for sleep, Disp: 30 tablet, Rfl: 2   triamcinolone cream (KENALOG) 0.1 %, SMARTSIG:1 Sparingly Topical Twice Daily PRN, Disp: , Rfl:    warfarin (COUMADIN) 5 MG tablet, TAKE 1/2 TO 1 TABLET BY MOUTH EVERY DAY OR AS DIRECTED BY COUMADIN CLINIC, Disp: 30 tablet,  Rfl: 5      Objective:   Vitals:   11/13/21 0858  BP: 126/78  Pulse: 74  Temp: 97.9 F (36.6 C)  TempSrc: Oral  SpO2: 98%  Weight: 179 lb 6.4 oz (81.4 kg)  Height: '5\' 4"'  (1.626 m)    Estimated body mass index is 30.79 kg/m as calculated from the following:   Height as of this encounter: '5\' 4"'  (1.626 m).   Weight as of this encounter: 179 lb 6.4 oz (81.4 kg).  '@WEIGHTCHANGE' @  [employer]   11/13/21 0858  Weight: 179 lb 6.4 oz (81.4 kg)     Physical Exam   General: No distress. Looks wel Neuro: Alert and Oriented x 3. GCS 15. Speech normal Psych: Pleasant Resp:  Barrel Chest - no.  Wheeze - no, Crackles - no, No overt respiratory distress CVS: Normal heart sounds. Murmurs - no Ext: Stigmata of Connective Tissue Disease - no HEENT: Normal upper airway. PEERL +. No post nasal drip        Assessment:       ICD-10-CM   1. COPD, severe ([place])  J44.9          Plan:     Patient Instructions  COPD, severe ([place]) COPD frequent exacerbation    - stable after reccnt flare up few weeks ago  - cxr oct 2022 is clear - lung exam 11/13/2021 Is clear  -  - noted continued need for night o2 ad you are lookng at getting pure o2 100 gallon tank from patio to inside hose to run tube up and down  Plan -continue YUPELRI nebulizer once daily - use 2.5L o2 at night and daytime continuous --Continue Helios portable system that you fill from tank outside in patio    Follow-up - 24 weeks with  DR Gaye Alken    Dr. Brand Males, M.D., F.C.C.P,   Pulmonary and Critical Care Medicine Staff Physician, [place] Director - Interstitial Lung Disease  Program  [facility] at [place], [place], [address]  Pager: [phone], If no answer or between  15:00h - 7:00h: call [phone] Telephone: [phone]  9:24 AM 11/13/2021

## 2021-11-17 ENCOUNTER — Encounter (HOSPITAL_COMMUNITY): Payer: Self-pay | Admitting: Psychiatry

## 2021-11-17 ENCOUNTER — Other Ambulatory Visit: Payer: Self-pay

## 2021-11-17 ENCOUNTER — Telehealth (HOSPITAL_BASED_OUTPATIENT_CLINIC_OR_DEPARTMENT_OTHER): Payer: Medicare Other | Admitting: Psychiatry

## 2021-11-17 DIAGNOSIS — F331 Major depressive disorder, recurrent, moderate: Secondary | ICD-10-CM

## 2021-11-17 DIAGNOSIS — F411 Generalized anxiety disorder: Secondary | ICD-10-CM | POA: Diagnosis not present

## 2021-11-17 MED ORDER — DESVENLAFAXINE SUCCINATE ER 50 MG PO TB24: 50.0000 mg | ORAL_TABLET | Freq: Every day | ORAL | 2 refills | Status: DC

## 2021-11-17 MED ORDER — TRAZODONE HCL 100 MG PO TABS: ORAL_TABLET | ORAL | 2 refills | Status: DC

## 2021-11-17 NOTE — Progress Notes (Signed)
Virtual Visit via Telephone Note  I connected with [name] on 11/17/21 at  8:20 AM EST by telephone and verified that I am speaking with the correct person using two identifiers.  Location: Patient: Home Provider: Home Office   I discussed the limitations, risks, security and privacy concerns of performing an evaluation and management service by telephone and the availability of in person appointments. I also discussed with the patient that there may be a patient responsible charge related to this service. The patient expressed understanding and agreed to proceed.   History of Present Illness: Patient is evaluated by phone session.  She had very good Christmas because she was able to see all her family member but after that she had been sick and recently her physician increased her oxygen.  She is now on 2 L oxygen.  Overall she described her mood is stable.  She denies any highs and lows, mania, anger or any crying spells.  She reported after she had COVID noticed some time memory issues and she has to write things so she do not get forgetful.  She has no new PCP Dr. Ashby Dawes and this Thursday she has appointment.  She like to address all her symptoms.  Her skin infection is finally healed.  She endorsed lately having dreams but surprisingly coming true.  She is sleeping 4 to 5 hours and hoping once she recover more after COVID able to sleep more.  She has no tremor or shakes.  She does drive but usually short distance and when there is not traffic on the road.  She avoids crowded places.  She denies any panic attack, agitation.  However she remains sometimes anxious about her chronic health issues.  Her weight is stable.  She like to keep the trazodone and Pristiq.  She also takes hydrocodone for chronic pain.  She denies drinking or using any illegal substances.  She lives by herself with her cats.  Her family lives close by.  She helped her older daughter to move out but refused to keep  her due to issues in the past.  She is pleased that her daughter is now working and able to support herself.   Past Psychiatric History:  H/O depression and anxiety. No H/O inpatient treatment, suicidal attempt, paranoia, mania and hallucination. Seeing psychiatrist in 2006 after a stroke.  Tried Cymbalta, lexapro and Celexa in the past with limited response.  Tried Ambien, Lunesta, lexapro, Lamictal and Xanax but developed allergies and side effects.  Tried higher Pristiq but has side effects. Valium helped in past.     Psychiatric Specialty Exam: Physical Exam  Review of Systems  Weight 179 lb (81.2 kg).There is no height or weight on file to calculate BMI.  General Appearance: NA  Eye Contact:  NA  Speech:  Clear and Coherent  Volume:  Normal  Mood:  Anxious  Affect:  NA  Thought Process:  Descriptions of Associations: Intact  Orientation:  Full (Time, Place, and Person)  Thought Content:  Rumination  Suicidal Thoughts:  No  Homicidal Thoughts:  No  Memory:  Immediate;   Fair Recent;   Fair Remote;   Fair  Judgement:  Intact  Insight:  Shallow  Psychomotor Activity:  NA  Concentration:  Concentration: Fair and Attention Span: Fair  Recall:  [employer] of Knowledge:  Fair  Language:  Good  Akathisia:  No  Handed:  Right  AIMS (if indicated):     Assets:  Communication Skills Desire  for Improvement Housing Resilience  ADL's:  Intact  Cognition:  WNL  Sleep:   4-5 hrs      Assessment and Plan: Major depressive disorder, recurrent.  Generalized anxiety disorder.  Review her medication.  She is taking gabapentin, Coumadin, Plavix and hydrocodone from other providers.  She like to keep the Pristiq and trazodone is helping her anxiety and depression.  Patient does not feel she need a therapist appointment.  She is happy with this Thursday going to see her new PCP associates.  Recommended to keep her appointment and if memory continues to get worse then she should consider  a neurology opinion.  Recommended to call us back if she has any question or any concern.  Follow-up in 3 months.  Follow Up Instructions:    I discussed the assessment and treatment plan with the patient. The patient was provided an opportunity to ask questions and all were answered. The patient agreed with the plan and demonstrated an understanding of the instructions.   The patient was advised to call back or seek an in-person evaluation if the symptoms worsen or if the condition fails to improve as anticipated.  I provided 22 minutes of non-face-to-face time during this encounter.   Kathlee Nations, MD

## 2021-11-27 ENCOUNTER — Ambulatory Visit (INDEPENDENT_AMBULATORY_CARE_PROVIDER_SITE_OTHER): Payer: Medicare Other

## 2021-11-27 ENCOUNTER — Ambulatory Visit (HOSPITAL_COMMUNITY)
Admission: RE | Admit: 2021-11-27 | Discharge: 2021-11-27 | Disposition: A | Discharge status: Home / Self Care | Payer: Medicare Other | Source: Ambulatory Visit | Attending: Cardiology | Admitting: Cardiology

## 2021-11-27 ENCOUNTER — Other Ambulatory Visit: Payer: Self-pay

## 2021-11-27 DIAGNOSIS — G458 Other transient cerebral ischemic attacks and related syndromes: Secondary | ICD-10-CM | POA: Insufficient documentation

## 2021-11-27 DIAGNOSIS — I480 Paroxysmal atrial fibrillation: Secondary | ICD-10-CM

## 2021-11-27 DIAGNOSIS — I6523 Occlusion and stenosis of bilateral carotid arteries: Secondary | ICD-10-CM | POA: Insufficient documentation

## 2021-11-27 DIAGNOSIS — Z7901 Long term (current) use of anticoagulants: Secondary | ICD-10-CM

## 2021-11-27 LAB — POCT INR: INR: 2.3 (ref 2.0–3.0)

## 2021-11-27 NOTE — Patient Instructions (Signed)
Continue taking 1/2 a tablet daily, except 1 tablet on Wednesday. Recheck INR in 6 weeks. [facility] [phone].

## 2021-11-28 ENCOUNTER — Other Ambulatory Visit: Payer: Self-pay | Admitting: *Deleted

## 2021-11-28 ENCOUNTER — Other Ambulatory Visit: Payer: Self-pay | Admitting: Family Medicine

## 2021-11-28 DIAGNOSIS — I6523 Occlusion and stenosis of bilateral carotid arteries: Secondary | ICD-10-CM

## 2021-11-28 DIAGNOSIS — G458 Other transient cerebral ischemic attacks and related syndromes: Secondary | ICD-10-CM

## 2021-11-28 NOTE — Progress Notes (Signed)
vas 

## 2021-12-05 ENCOUNTER — Other Ambulatory Visit: Payer: Self-pay

## 2021-12-05 ENCOUNTER — Encounter (HOSPITAL_COMMUNITY): Payer: Self-pay

## 2021-12-05 ENCOUNTER — Observation Stay (HOSPITAL_COMMUNITY)
Admission: EM | Admit: 2021-12-05 | Discharge: 2021-12-06 | Disposition: A | Discharge status: Home / Self Care | Payer: Medicare Other | Attending: Family Medicine | Admitting: Family Medicine

## 2021-12-05 ENCOUNTER — Emergency Department (HOSPITAL_COMMUNITY): Payer: Medicare Other

## 2021-12-05 DIAGNOSIS — J9611 Chronic respiratory failure with hypoxia: Secondary | ICD-10-CM | POA: Diagnosis present

## 2021-12-05 DIAGNOSIS — I480 Paroxysmal atrial fibrillation: Principal | ICD-10-CM | POA: Diagnosis present

## 2021-12-05 DIAGNOSIS — Z79899 Other long term (current) drug therapy: Secondary | ICD-10-CM | POA: Diagnosis not present

## 2021-12-05 DIAGNOSIS — R079 Chest pain, unspecified: Secondary | ICD-10-CM

## 2021-12-05 DIAGNOSIS — I5031 Acute diastolic (congestive) heart failure: Secondary | ICD-10-CM | POA: Diagnosis not present

## 2021-12-05 DIAGNOSIS — I5022 Chronic systolic (congestive) heart failure: Secondary | ICD-10-CM | POA: Diagnosis not present

## 2021-12-05 DIAGNOSIS — Z20822 Contact with and (suspected) exposure to covid-19: Secondary | ICD-10-CM | POA: Diagnosis not present

## 2021-12-05 DIAGNOSIS — M4716 Other spondylosis with myelopathy, lumbar region: Secondary | ICD-10-CM | POA: Diagnosis present

## 2021-12-05 DIAGNOSIS — R7989 Other specified abnormal findings of blood chemistry: Secondary | ICD-10-CM | POA: Diagnosis present

## 2021-12-05 DIAGNOSIS — J449 Chronic obstructive pulmonary disease, unspecified: Secondary | ICD-10-CM | POA: Diagnosis not present

## 2021-12-05 DIAGNOSIS — Z7901 Long term (current) use of anticoagulants: Secondary | ICD-10-CM

## 2021-12-05 DIAGNOSIS — Z87891 Personal history of nicotine dependence: Secondary | ICD-10-CM | POA: Diagnosis not present

## 2021-12-05 DIAGNOSIS — I110 Hypertensive heart disease with heart failure: Secondary | ICD-10-CM | POA: Diagnosis not present

## 2021-12-05 DIAGNOSIS — R778 Other specified abnormalities of plasma proteins: Secondary | ICD-10-CM | POA: Diagnosis present

## 2021-12-05 DIAGNOSIS — I4891 Unspecified atrial fibrillation: Secondary | ICD-10-CM | POA: Diagnosis present

## 2021-12-05 DIAGNOSIS — I10 Essential (primary) hypertension: Secondary | ICD-10-CM | POA: Diagnosis present

## 2021-12-05 DIAGNOSIS — R002 Palpitations: Secondary | ICD-10-CM | POA: Diagnosis present

## 2021-12-05 DIAGNOSIS — E6609 Other obesity due to excess calories: Secondary | ICD-10-CM

## 2021-12-05 DIAGNOSIS — E66811 Obesity, class 1: Secondary | ICD-10-CM

## 2021-12-05 DIAGNOSIS — Z9660 Presence of unspecified orthopedic joint implant: Secondary | ICD-10-CM | POA: Insufficient documentation

## 2021-12-05 DIAGNOSIS — Z6831 Body mass index (BMI) 31.0-31.9, adult: Secondary | ICD-10-CM

## 2021-12-05 DIAGNOSIS — J441 Chronic obstructive pulmonary disease with (acute) exacerbation: Secondary | ICD-10-CM | POA: Diagnosis present

## 2021-12-05 LAB — CBC WITH DIFFERENTIAL/PLATELET
Abs Immature Granulocytes: 0.05 10*3/uL (ref 0.00–0.07)
Basophils Absolute: 0 10*3/uL (ref 0.0–0.1)
Basophils Relative: 0 %
Eosinophils Absolute: 0.1 10*3/uL (ref 0.0–0.5)
Eosinophils Relative: 1 %
HCT: 39.4 % (ref 36.0–46.0)
Hemoglobin: 13.2 g/dL (ref 12.0–15.0)
Immature Granulocytes: 1 %
Lymphocytes Relative: 9 %
Lymphs Abs: 1 10*3/uL (ref 0.7–4.0)
MCH: 31.2 pg (ref 26.0–34.0)
MCHC: 33.5 g/dL (ref 30.0–36.0)
MCV: 93.1 fL (ref 80.0–100.0)
Monocytes Absolute: 0.9 10*3/uL (ref 0.1–1.0)
Monocytes Relative: 8 %
Neutro Abs: 8.7 10*3/uL — ABNORMAL HIGH (ref 1.7–7.7)
Neutrophils Relative %: 81 %
Platelets: 180 10*3/uL (ref 150–400)
RBC: 4.23 MIL/uL (ref 3.87–5.11)
RDW: 13 % (ref 11.5–15.5)
WBC: 10.7 10*3/uL — ABNORMAL HIGH (ref 4.0–10.5)
nRBC: 0 % (ref 0.0–0.2)

## 2021-12-05 LAB — COMPREHENSIVE METABOLIC PANEL
ALT: 12 U/L (ref 0–44)
AST: 16 U/L (ref 15–41)
Albumin: 3.9 g/dL (ref 3.5–5.0)
Alkaline Phosphatase: 56 U/L (ref 38–126)
Anion gap: 8 (ref 5–15)
BUN: 18 mg/dL (ref 8–23)
CO2: 31 mmol/L (ref 22–32)
Calcium: 9.5 mg/dL (ref 8.9–10.3)
Chloride: 96 mmol/L — ABNORMAL LOW (ref 98–111)
Creatinine, Ser: 1.09 mg/dL — ABNORMAL HIGH (ref 0.44–1.00)
GFR, Estimated: 53 mL/min — ABNORMAL LOW (ref >60)
Glucose, Bld: 141 mg/dL — ABNORMAL HIGH (ref 70–99)
Potassium: 3.7 mmol/L (ref 3.5–5.1)
Sodium: 135 mmol/L (ref 135–145)
Total Bilirubin: 0.5 mg/dL (ref 0.3–1.2)
Total Protein: 6.3 g/dL — ABNORMAL LOW (ref 6.5–8.1)

## 2021-12-05 LAB — TROPONIN I (HIGH SENSITIVITY): Troponin I (High Sensitivity): 9 ng/L (ref <18)

## 2021-12-05 MED ORDER — SODIUM CHLORIDE 0.9 % IV BOLUS: 500.0000 mL | Freq: Once | INTRAVENOUS | Status: DC

## 2021-12-05 MED ORDER — METOPROLOL TARTRATE 5 MG/5ML IV SOLN
2.5000 mg | Freq: Once | INTRAVENOUS | Status: DC
  Filled 2021-12-05: qty 5

## 2021-12-05 NOTE — ED Notes (Addendum)
Pt was uncomfortable in stretcher, asked to sit up and move herself back in the bed about 6 inches. When pt did this, her HR converted to NSR rate 80. MD notified and states to hold medications ordered and monitor for HR increase. Pt verbalized understanding. Pt denies any complaints. Pt given ice water per request. Will continue to monitor.

## 2021-12-05 NOTE — ED Triage Notes (Signed)
Pt presents to the ED from home via [facility] with complaints of palpitations. Pt received bad news today and then began having palpitations. Pt has hx of Afib and SVT. Pt took 1 SL NTG to help with HR. Cardiologist Dr. Martinique. Pt given Zofran 4mg  IV by EMS for nausea.  HR 140 BP 140/90

## 2021-12-05 NOTE — ED Provider Notes (Addendum)
[facility] EMERGENCY DEPARTMENT Provider Note   CSN: [id number] Arrival date & time: 12/05/21  2045     History  Chief Complaint  Patient presents with   Palpitations    Laurie Horton is a 76 y.o. female.  Patient with history of COPD, on 2-3L oxygen support at baseline, paroxysmal afib, on coumadin, presents to ER chief complaint of chest palpitations and discomfort.  Laurie Horton stated started about 2 3 hours prior to arrival.  Is been persistent.  Denies radiation elsewhere.  Describes as a fluttering sensation.  No fevers no cough no vomiting or diarrhea.  Laurie Horton has a history of atrial fibrillation with intermittent.      Home Medications Prior to Admission medications   Medication Sig Start Date End Date Taking? Authorizing Provider  carvedilol (COREG) 12.5 MG tablet TAKE 1 TABLET BY MOUTH TWICE A DAY 02/27/21   Martinique, Peter M, MD  clopidogrel (PLAVIX) 75 MG tablet TAKE 1 TABLET BY MOUTH EVERY DAY 05/12/21   Martinique, Peter M, MD  desvenlafaxine (PRISTIQ) 50 MG 24 hr tablet Take 1 tablet (50 mg total) by mouth daily. 11/17/21   Arfeen, Arlyce Harman, MD  diclofenac sodium (VOLTAREN) 1 % GEL APPLY 2 GRAMS TO BOTH HANDS 4 TIMES A DAY 06/21/18   [provider]  Eyelid Cleansers (OCUSOFT BABY EYELID & EYELASH EX) Apply topically.    [provider]  furosemide (LASIX) 40 MG tablet TAKE 1 TABLET BY MOUTH EVERY DAY 09/29/21   Martinique, Peter M, MD  gabapentin (NEURONTIN) 100 MG capsule Take 200 mg by mouth See admin instructions. Take 1 in am and 1 in the lunch and 2 at bedtime 09/16/16   [provider]  HYDROcodone-acetaminophen (NORCO/VICODIN) 5-325 MG tablet Take 1 tablet by mouth 3 (three) times daily.    [provider]  montelukast (SINGULAIR) 10 MG tablet TAKE 1 TABLET BY MOUTH EVERYDAY AT BEDTIME 07/02/21   [name] Agreste, MD  Multiple Vitamins-Minerals (VITAMIN D3 COMPLETE PO) Take by mouth.    [provider]  nitroGLYCERIN  (NITROSTAT) 0.4 MG SL tablet PLACE 1 TABLET UNDER THE TONGUE EVERY 5 MINUTES AS NEEDED FOR CHEST PAIN 06/30/21   Martinique, Peter M, MD  Omega-3 Fatty Acids (FISH OIL) 1000 MG CAPS Take by mouth daily.    [provider]  ondansetron (ZOFRAN-ODT) 8 MG disintegrating tablet Take 1 tablet (8 mg total) by mouth every 8 (eight) hours as needed for nausea or vomiting. 11/13/20   [name] Agreste, MD  oxybutynin (DITROPAN-XL) 10 MG 24 hr tablet Take 1 tablet (10 mg total) by mouth daily. 07/02/21   [name] Agreste, MD  OXYGEN Inhale 1.5-3 L into the lungs as needed (for shortness of breath).    [provider]  pantoprazole (PROTONIX) 40 MG tablet TAKE 1 TABLET BY MOUTH EVERY DAY 11/28/21   [name] Agreste, MD  polyethylene glycol [facility] / GLYCOLAX) packet Take 17 g by mouth daily as needed for mild constipation.     [provider]  PRALUENT 75 MG/ML SOAJ INJECT 75 MG INTO THE SKIN EVERY 14 (FOURTEEN) DAYS. 07/28/21   Martinique, Peter M, MD  revefenacin [facility]) 175 MCG/3ML nebulizer solution Take 3 mLs (175 mcg total) by nebulization daily. 07/22/21   Brand Males, MD  spironolactone (ALDACTONE) 25 MG tablet TAKE 1 TABLET BY MOUTH EVERY DAY 03/10/21   Martinique, Peter M, MD  traZODone (DESYREL) 100 MG tablet Take one tab daily as needed for sleep  11/17/21   Kathlee Nations, MD  triamcinolone cream (KENALOG) 0.1 % SMARTSIG:1 Sparingly Topical Twice Daily PRN 04/18/20   [provider]  warfarin (COUMADIN) 5 MG tablet TAKE 1/2 TO 1 TABLET BY MOUTH EVERY DAY OR AS DIRECTED BY COUMADIN CLINIC 06/09/21   Martinique, Peter M, MD      Allergies    Alprazolam, Bee venom, Iodine, Pseudoephedrine hcl er, Budesonide-formoterol fumarate, Crestor [rosuvastatin calcium], Esomeprazole magnesium, Flonase [fluticasone propionate], Lamictal [lamotrigine], Loratadine, Lotrimin [clotrimazole], Lunesta [eszopiclone], Oxcarbazepine, Statins, Zolpidem tartrate, Betadine [povidone iodine], Bevespi  aerosphere [glycopyrrolate-formoterol], Breztri aerosphere [budeson-glycopyrrol-formoterol], Clarithromycin, Effexor [venlafaxine], Lexapro [escitalopram oxalate], Aciphex [rabeprazole sodium], Alendronate sodium, Avelox [moxifloxacin hcl in nacl], Bextra [valdecoxib], Ceclor [cefaclor], Cephalexin, Covera-hs [verapamil hcl], Dicyclomine hcl, Other, and Tessalon perles    Review of Systems   Review of Systems  Constitutional:  Negative for fever.  HENT:  Negative for ear pain.   Eyes:  Negative for pain.  Respiratory:  Positive for chest tightness. Negative for cough.   Cardiovascular:  Positive for palpitations.  Gastrointestinal:  Negative for abdominal pain.  Genitourinary:  Negative for flank pain.  Musculoskeletal:  Negative for back pain.  Skin:  Negative for rash.  Neurological:  Negative for headaches.   Physical Exam Updated Vital Signs BP 129/69    Pulse 79    Temp 98.4 F (36.9 C) (Oral)    Resp 14    Ht 5\' 4"  (1.626 m)    Wt 82.1 kg    SpO2 96%    BMI 31.07 kg/m  Physical Exam Constitutional:      General: Laurie Horton is not in acute distress.    Appearance: Normal appearance.  HENT:     Head: Normocephalic.     Nose: Nose normal.  Eyes:     Extraocular Movements: Extraocular movements intact.  Cardiovascular:     Rate and Rhythm: Tachycardia present. Rhythm irregular.  Pulmonary:     Effort: Pulmonary effort is normal.  Musculoskeletal:        General: Normal range of motion.     Cervical back: Normal range of motion.  Neurological:     General: No focal deficit present.     Mental Status: Laurie Horton is alert. Mental status is at baseline.    ED Results / Procedures / Treatments   Labs (all labs ordered are listed, but only abnormal results are displayed) Labs Reviewed  CBC WITH DIFFERENTIAL/PLATELET - Abnormal; Notable for the following components:      Result Value   WBC 10.7 (*)    Neutro Abs 8.7 (*)    All other components within normal limits  COMPREHENSIVE  METABOLIC PANEL - Abnormal; Notable for the following components:   Chloride 96 (*)    Glucose, Bld 141 (*)    Creatinine, Ser 1.09 (*)    Total Protein 6.3 (*)    GFR, Estimated 53 (*)    All other components within normal limits  PROTIME-INR  TROPONIN I (HIGH SENSITIVITY)  TROPONIN I (HIGH SENSITIVITY)    EKG EKG Interpretation  Date/Time:  Friday December 05 2021 21:31:28 EST Ventricular Rate:  76 PR Interval:  189 QRS Duration: 107 QT Interval:  407 QTC Calculation: 458 R Axis:   87 Text Interpretation: Sinus rhythm Atrial premature complexes in couplets Borderline right axis deviation Low voltage, precordial leads Nonspecific T abnormalities, anterior leads Confirmed by Thamas Jaegers ([id number]) on 12/05/2021 10:38:53 PM  Radiology DG Chest Portable 1 View  Result Date: 12/05/2021 CLINICAL DATA:  Pain, palpitations  EXAM: PORTABLE CHEST 1 VIEW COMPARISON:  08/21/2021 FINDINGS: Cardiomegaly, vascular congestion. Diffuse interstitial prominence within the lungs, particularly lung bases with bibasilar airspace opacities. Favor edema. No effusions or pneumothorax. No acute bony abnormality. IMPRESSION: Cardiomegaly with vascular congestion.  Suspect early edema/CHF. Electronically Signed   By: Rolm Baptise M.D.   On: 12/05/2021 23:06    Procedures .Critical Care Performed by: Luna Fuse, MD Authorized by: Luna Fuse, MD   Critical care provider statement:    Critical care time (minutes):  30   Critical care time was exclusive of:  Separately billable procedures and treating other patients and teaching time   Critical care was necessary to treat or prevent imminent or life-threatening deterioration of the following conditions:  Cardiac failure    Medications Ordered in ED Medications - No data to display  ED Course/ Medical Decision Making/ A&P                           Medical Decision Making Amount and/or Complexity of Data Reviewed Labs: ordered. Radiology:  ordered.   Review of records show office visit with her pulmonologist November 13, 2021 for COPD exacerbation.  EKG today shows atrial fibrillation with rapid ventricular rate heart rate ranging from 100-140 bpm.  Patient initially ordered for IV metoprolol and some fluid resuscitation.  However as Laurie Horton was adjusting herself in the bed Laurie Horton spontaneously converted back to sinus rhythm and has been maintained in sinus rhythm since.  Laurie Horton feels still some intermittent episodes of chest discomfort Laurie Horton states.  Initial troponin has been unremarkable.  Repeat troponin sent and pending.        Final Clinical Impression(s) / ED Diagnoses Final diagnoses:  Atrial fibrillation with RVR ([place])  Chest pain, unspecified type    Rx / DC Orders ED Discharge Orders     None         Luna Fuse, MD 12/05/21 2321    Luna Fuse, MD 12/05/21 [phone]

## 2021-12-05 NOTE — ED Notes (Signed)
ED Provider at bedside. 

## 2021-12-06 ENCOUNTER — Encounter (HOSPITAL_COMMUNITY): Payer: Self-pay | Admitting: Internal Medicine

## 2021-12-06 ENCOUNTER — Observation Stay (HOSPITAL_BASED_OUTPATIENT_CLINIC_OR_DEPARTMENT_OTHER): Payer: Medicare Other

## 2021-12-06 DIAGNOSIS — J9611 Chronic respiratory failure with hypoxia: Secondary | ICD-10-CM

## 2021-12-06 DIAGNOSIS — I4891 Unspecified atrial fibrillation: Secondary | ICD-10-CM | POA: Diagnosis not present

## 2021-12-06 DIAGNOSIS — E6609 Other obesity due to excess calories: Secondary | ICD-10-CM

## 2021-12-06 DIAGNOSIS — R9431 Abnormal electrocardiogram [ECG] [EKG]: Secondary | ICD-10-CM | POA: Diagnosis not present

## 2021-12-06 DIAGNOSIS — Z6831 Body mass index (BMI) 31.0-31.9, adult: Secondary | ICD-10-CM

## 2021-12-06 DIAGNOSIS — I5022 Chronic systolic (congestive) heart failure: Secondary | ICD-10-CM | POA: Diagnosis not present

## 2021-12-06 DIAGNOSIS — J449 Chronic obstructive pulmonary disease, unspecified: Secondary | ICD-10-CM

## 2021-12-06 DIAGNOSIS — R778 Other specified abnormalities of plasma proteins: Secondary | ICD-10-CM

## 2021-12-06 DIAGNOSIS — I480 Paroxysmal atrial fibrillation: Secondary | ICD-10-CM | POA: Diagnosis not present

## 2021-12-06 DIAGNOSIS — R7989 Other specified abnormal findings of blood chemistry: Secondary | ICD-10-CM | POA: Diagnosis present

## 2021-12-06 DIAGNOSIS — M4716 Other spondylosis with myelopathy, lumbar region: Secondary | ICD-10-CM

## 2021-12-06 DIAGNOSIS — I10 Essential (primary) hypertension: Secondary | ICD-10-CM

## 2021-12-06 LAB — RESP PANEL BY RT-PCR (FLU A&B, COVID) ARPGX2
Influenza A by PCR: NEGATIVE
Influenza B by PCR: NEGATIVE
SARS Coronavirus 2 by RT PCR: NEGATIVE

## 2021-12-06 LAB — ECHOCARDIOGRAM COMPLETE
AV Mean grad: 4 mmHg
AV Peak grad: 7.2 mmHg
Ao pk vel: 1.34 m/s
Area-P 1/2: 2.87 cm2
Height: 64 in
S' Lateral: 3.2 cm
Weight: 2896 oz

## 2021-12-06 LAB — TROPONIN I (HIGH SENSITIVITY)
Troponin I (High Sensitivity): 24 ng/L — ABNORMAL HIGH (ref <18)
Troponin I (High Sensitivity): 36 ng/L — ABNORMAL HIGH (ref <18)

## 2021-12-06 LAB — PROTIME-INR
INR: 2 — ABNORMAL HIGH (ref 0.8–1.2)
Prothrombin Time: 22.7 seconds — ABNORMAL HIGH (ref 11.4–15.2)

## 2021-12-06 LAB — BRAIN NATRIURETIC PEPTIDE: B Natriuretic Peptide: 63 pg/mL (ref 0.0–100.0)

## 2021-12-06 MED ORDER — GABAPENTIN 100 MG PO CAPS
100.0000 mg | ORAL_CAPSULE | Freq: Once | ORAL | Status: AC
  Administered 2021-12-06: 100 mg via ORAL
  Filled 2021-12-06: qty 1

## 2021-12-06 MED ORDER — HYDROCODONE-ACETAMINOPHEN 5-325 MG PO TABS
1.0000 | ORAL_TABLET | Freq: Three times a day (TID) | ORAL | Status: DC
  Administered 2021-12-06: 1 via ORAL
  Filled 2021-12-06: qty 1

## 2021-12-06 MED ORDER — ALBUTEROL SULFATE (2.5 MG/3ML) 0.083% IN NEBU: 2.5000 mg | INHALATION_SOLUTION | RESPIRATORY_TRACT | Status: DC | PRN

## 2021-12-06 MED ORDER — SPIRONOLACTONE 25 MG PO TABS
25.0000 mg | ORAL_TABLET | Freq: Every day | ORAL | Status: DC
  Administered 2021-12-06: 25 mg via ORAL
  Filled 2021-12-06: qty 1

## 2021-12-06 MED ORDER — PANTOPRAZOLE SODIUM 40 MG PO TBEC
40.0000 mg | DELAYED_RELEASE_TABLET | Freq: Every day | ORAL | Status: DC
Start: 2021-12-06 — End: 2021-12-06
  Administered 2021-12-06: 40 mg via ORAL
  Filled 2021-12-06: qty 1

## 2021-12-06 MED ORDER — MAGNESIUM SULFATE 2 GM/50ML IV SOLN
2.0000 g | Freq: Once | INTRAVENOUS | Status: AC
  Administered 2021-12-06: 2 g via INTRAVENOUS
  Filled 2021-12-06: qty 50

## 2021-12-06 MED ORDER — POTASSIUM CHLORIDE CRYS ER 20 MEQ PO TBCR
40.0000 meq | EXTENDED_RELEASE_TABLET | Freq: Once | ORAL | Status: AC
  Administered 2021-12-06: 40 meq via ORAL
  Filled 2021-12-06: qty 2

## 2021-12-06 MED ORDER — GABAPENTIN 100 MG PO CAPS: 100.0000 mg | ORAL_CAPSULE | Freq: Two times a day (BID) | ORAL | Status: DC

## 2021-12-06 MED ORDER — ONDANSETRON HCL 4 MG/2ML IJ SOLN
4.0000 mg | Freq: Once | INTRAMUSCULAR | Status: AC
  Administered 2021-12-06: 4 mg via INTRAVENOUS
  Filled 2021-12-06: qty 2

## 2021-12-06 MED ORDER — CARVEDILOL 3.125 MG PO TABS
12.5000 mg | ORAL_TABLET | Freq: Two times a day (BID) | ORAL | Status: DC
  Administered 2021-12-06: 12.5 mg via ORAL
  Filled 2021-12-06: qty 4

## 2021-12-06 MED ORDER — ACETAMINOPHEN 325 MG PO TABS: 650.0000 mg | ORAL_TABLET | Freq: Four times a day (QID) | ORAL | Status: DC | PRN

## 2021-12-06 MED ORDER — REVEFENACIN 175 MCG/3ML IN SOLN
175.0000 ug | Freq: Every day | RESPIRATORY_TRACT | Status: DC
  Filled 2021-12-06: qty 3

## 2021-12-06 MED ORDER — CLOPIDOGREL BISULFATE 75 MG PO TABS
75.0000 mg | ORAL_TABLET | Freq: Every day | ORAL | Status: DC
Start: 2021-12-06 — End: 2021-12-06
  Administered 2021-12-06: 75 mg via ORAL
  Filled 2021-12-06: qty 1

## 2021-12-06 MED ORDER — ONDANSETRON HCL 4 MG/2ML IJ SOLN: 4.0000 mg | Freq: Four times a day (QID) | INTRAMUSCULAR | Status: DC | PRN

## 2021-12-06 MED ORDER — WARFARIN SODIUM 2.5 MG PO TABS
2.5000 mg | ORAL_TABLET | Freq: Once | ORAL | Status: DC
  Filled 2021-12-06: qty 1

## 2021-12-06 MED ORDER — WARFARIN - PHARMACIST DOSING INPATIENT: Freq: Every day | Status: DC

## 2021-12-06 MED ORDER — ACETAMINOPHEN 650 MG RE SUPP: 650.0000 mg | Freq: Four times a day (QID) | RECTAL | Status: DC | PRN

## 2021-12-06 MED ORDER — ONDANSETRON HCL 4 MG PO TABS: 4.0000 mg | ORAL_TABLET | Freq: Four times a day (QID) | ORAL | Status: DC | PRN

## 2021-12-06 MED ORDER — FUROSEMIDE 20 MG PO TABS
40.0000 mg | ORAL_TABLET | Freq: Every day | ORAL | Status: DC
Start: 2021-12-06 — End: 2021-12-06
  Administered 2021-12-06: 40 mg via ORAL
  Filled 2021-12-06: qty 2

## 2021-12-06 MED ORDER — HYDROCODONE-ACETAMINOPHEN 5-325 MG PO TABS: 1.0000 | ORAL_TABLET | Freq: Once | ORAL | Status: DC

## 2021-12-06 MED ORDER — MONTELUKAST SODIUM 10 MG PO TABS
10.0000 mg | ORAL_TABLET | Freq: Every day | ORAL | Status: DC
  Filled 2021-12-06: qty 1

## 2021-12-06 MED ORDER — DICLOFENAC SODIUM 1 % TD GEL: 2.0000 g | Freq: Four times a day (QID) | TRANSDERMAL | Status: DC

## 2021-12-06 NOTE — ED Notes (Signed)
Pt given applesauce per request. No acute changes noted. Will continue to monitor.

## 2021-12-06 NOTE — Assessment & Plan Note (Signed)
Is on Coumadin, Coreg

## 2021-12-06 NOTE — Subjective & Objective (Signed)
CC: palpitations HPI: 76 year old white female with a history of paroxysmal atrial fibrillation, on Coumadin, chronic hypoxic respiratory failure on 2 to 3 L of oxygen, COPD, chronic back pain, history of systolic heart failure, obesity presents to the ER today with palpitations.  Patient states that she received some bad news from her neighbor.  She started feeling palpitation in his chest.  She took several nitroglycerin which did not resolve her palpitations.  EMS was activated.  On arrival to the ER, patient was tachycardic with a heart rate of 139.  EKG showed rapid atrial fibrillation.  As the patient was trying to move her self up in the bed, she spontaneously converted herself to normal sinus rhythm.  Patient's initial cardiac markers were negative but after several hours, her third set of troponins now is up to 36.  Patient had a right and left heart catheterization back in 2018 which demonstrated obstructive cardiac disease with a 70% proximal OM1, 90% small OM 2.  She had EF of 25% at that time.  With medical management, her EF improved 4 months later to 55%.  Due to the elevated cardiac markers, cardiology wanted the patient admitted to hospital.  [facility] hospitalist contacted for admission.  Patient states that at no point during the day today that she had chest pain.  She did have palpitations.  She is current complaining of back pain.  She did not take her nightly hydrocodone or her Neurontin.  She still mildly nauseous.  Zofran has been not been effective.

## 2021-12-06 NOTE — Assessment & Plan Note (Signed)
Resolved spontaneously in the ER.  Likely the patient had a Valsalva maneuver as she was trying to move herself up into the ER gurney.  Potassium is less than 4 we will give her 40 mEq of p.o. potassium along with 2 g of IV magnesium.  Patient's already on Coumadin at home.  INR is therapeutic.  Continue Coreg.

## 2021-12-06 NOTE — ED Notes (Signed)
Pt ambulated to restroom. 

## 2021-12-06 NOTE — Assessment & Plan Note (Signed)
Continue supplemental oxygen. 

## 2021-12-06 NOTE — Progress Notes (Signed)
° ° °  Cardiology consult note reviewed from earlier this morning and the patient spontaneously converted to normal sinus rhythm. Echocardiogram today shows a preserved EF of 55 to 60% with no regional motion abnormalities. LA was mildly dilated. She is already on Coumadin at home for anticoagulation along with Coreg 12.5 mg twice daily for rate control as she has a known history of paroxysmal atrial fibrillation. Reviewed with Dr. Debara Pickett and will arrange for outpatient Cardiology follow-up but no additional Cardiology testing planned this admission. Please call back if we can be of further assistance this admission.   Signed, Erma Heritage, PA-C 12/06/2021, 2:04 PM

## 2021-12-06 NOTE — Assessment & Plan Note (Signed)
Stable.  Without exacerbation.

## 2021-12-06 NOTE — Consult Note (Signed)
Cardiology Consultation:   Patient ID: Laurie Horton MRN: [id number]; DOB: [birth year]  Admit date: 12/05/2021 Date of Consult: 12/06/2021  PCP:  Merrilee Seashore, MD   [facility] Providers Cardiologist:  Peter Martinique, MD        Patient Profile:   Laurie Horton is a 76 y.o. female with a hx of chronic diastolic CHF,Ischemic cardiomyopathy (2v CAD 100% CTO RCA, 75% OM1, 90% OM2-small vessel), h/o HFrEF25-30%->improved to 55%, severe PAD s/p left iliac stenting 2019,  PFO, PAF (on Coumadin), COPD (on 2L [place] at baseline), HTN, and prior CVA who is being seen 12/06/2021 for the evaluation of palpitations, chest pain, afib rvr, elevated troponin at the request of ER doc.  History of Present Illness:   Laurie Horton is a 76 y.o. female with a hx of chronic diastolic CHF,,Ischemic cardiomyopathy (2v CAD 100% CTO RCA, 75% OM1, 90% OM2-small vessel), h/o HFrEF25-30%->improved to 55%, severe PAD s/p left iliac stenting 2019, PFO, PAF (on Coumadin), COPD (on 2L [place] at baseline), HTN, and prior CVA who is being seen 12/06/2021 for the evaluation of palpitations, chest pain, afib rvr, elevated troponin.  She reports last night she had a conversation with her neighbor which affected her very much and after that started having fluttery sensation in her chest, not feeling well, numbness in the hand. She took 3 SL nitro without any relief so called 911. She was doing valsalva maneuver with EMS, Initially, in the ER, she was in afib RVR- HR 130s and spontaneously converted to NSR.  Trops 24/36 INR 2.0 BNP is negative Cxr- vascular congestion  During my exam- patient is in NSR, vitals are normal, ambulating the hallway without any symptoms. She denies any chest pain, CHF symptoms or dizziness.   EKG: afib RVR, HR 132, non specific ST-T wave changes Repeat EKG: NSR with TWI in lead 3 and anterior leads- which were present on 07/02/21  ECHO:2018 Study Conclusions   - Left ventricle: The cavity  size was normal. There was moderate    concentric hypertrophy. Systolic function was normal. The    estimated ejection fraction was in the range of 50% to 55%.    Severe hypokinesis of the basal-midinferior myocardium;    consistent with infarction in the distribution of the right    coronary artery. Doppler parameters are consistent with abnormal    left ventricular relaxation (grade 1 diastolic dysfunction).  - Mitral valve: Calcified annulus.   CATH 2018 Right/Left Heart Cath and Coronary Angiography  Conclusion      Ost 1st Mrg to 1st Mrg lesion, 75 %stenosed. 2nd Mrg lesion, 90 %stenosed. Ost RCA to Mid RCA lesion, 100 %stenosed. There is severe left ventricular systolic dysfunction. LV end diastolic pressure is normal. The left ventricular ejection fraction is 25-35% by visual estimate. Hemodynamic findings consistent with mild pulmonary hypertension. LV end diastolic pressure is normal.   1. Severe 2 vessel obstructive CAD    - 75% proximal OM1    - 90% small OM2    - 100% proximal RCA. Left to right collaterals.  2. Severe LV dysfunction 3. Mild pulmonary HTN with normal LV filling pressures.  4. Cardiac index 2.41 L/min/BSA  Plan: Medical management to try and optimize CHF therapy. Patient appears to be adequately diuresed at this time. Based on these results her cardiomyopathy is ischemic. I would treat her CAD medically. If cardiac cath is needed in the future would consider alternative access given difficulty from the right radial approach. Her  rhythm during procedure is a multifocal atrial rhythm/tachycardia.      Past Medical History:  Diagnosis Date   Cataract    OD   CHF (congestive heart failure) ([place]) 03/02/2017   EF 60-45% 4098   Complication of anesthesia    various issues with oxygen  saturations post op   COPD (chronic obstructive pulmonary disease) (HCC)    Depression    Depression    [employer]reesia 11/04/2020   Diverticulitis    DVT (deep venous  thrombosis) (HCC)    Fibromyalgia    GERD (gastroesophageal reflux disease)    Heart murmur    Phreesia 11/04/2020   Hiatal hernia    History of deviated nasal septum    left- side   HTN (hypertension)    Hyperlipidemia    Hypertension    [employer]reesia 11/04/2020   Hypertensive retinopathy    OU   Myocardial infarction ([place])    Phreesia 11/04/2020   Obesity    On supplemental oxygen therapy    concentrator at night @ 1.5 l/m or when sleeps. O2 Sat niormally 87.   Osteoporosis    Osteoporosis    Phreesia 11/04/2020   Oxygen deficiency    [employer]reesia 11/04/2020   PAT (paroxysmal atrial tachycardia) (HCC)    PFO (patent foramen ovale)    PULMONARY NODULE, LEFT LOWER LOBE 10/14/2009   86mm LLL nodule dec 2010. Stable and 41mm in Oct 2012. No further fu   PVD (peripheral vascular disease) with claudication ([place]) 12/2017   Right middle lobe pneumonia 07/24/2011   First noted at admit 07/10/11. Persists on cxr 07/22/11. Cleared on CT 08/24/11. No further followup   Stroke [facility])    TOBACCO ABUSE 06/04/2009    Past Surgical History:  Procedure Laterality Date   ABDOMINAL HYSTERECTOMY N/A    [employer]reesia 11/04/2020   CARDIOVASCULAR STRESS TEST  12/26/2004   EF 74%. NO EVIDENCE OF ISCHEMIA   CATARACT EXTRACTION Left    Dr. Elliot Dally   ESOPHAGOGASTRODUODENOSCOPY (EGD) WITH PROPOFOL N/A 04/15/2015   Procedure: ESOPHAGOGASTRODUODENOSCOPY (EGD) WITH PROPOFOL;  Surgeon: Laurence Spates, MD;  Location: [facility];  Service: Endoscopy;  Laterality: N/A;   EYE SURGERY Left    Cat Sx   JOINT REPLACEMENT N/A    [employer]reesia 11/04/2020   KNEE ARTHROSCOPY  2000   left   LAPAROSCOPIC CHOLECYSTECTOMY  04-16-2010   [place]   LOWER EXTREMITY ANGIOGRAPHY N/A 09/09/2017   Procedure: Lower Extremity Angiography;  Surgeon: Lorretta Harp, MD;  Location: [place] [facility];  Service: Cardiovascular;  Laterality: N/A;   LOWER EXTREMITY INTERVENTION Left 01/17/2018   Procedure: LOWER EXTREMITY INTERVENTION;  Surgeon:  Lorretta Harp, MD;  Location: [place] [facility];  Service: Cardiovascular;  Laterality: Left;   MOUTH SURGERY     03-26-15 multiple extractions stitches remains   PERIPHERAL VASCULAR INTERVENTION Left 01/17/2018   Procedure: PERIPHERAL VASCULAR INTERVENTION;  Surgeon: Lorretta Harp, MD;  Location: [place] [facility];  Service: Cardiovascular;  Laterality: Left;  COMMON ILIAC   RIGHT/LEFT HEART CATH AND CORONARY ANGIOGRAPHY N/A 03/04/2017   Procedure: Right/Left Heart Cath and Coronary Angiography;  Surgeon: Peter M Martinique, MD;  Location: [place] [facility];  Service: Cardiovascular;  Laterality: N/A;   TOTAL ABDOMINAL HYSTERECTOMY     post op needed oxygen was told "she gave them a scare"   TUBAL LIGATION     US ECHOCARDIOGRAPHY  11/20/2009   EF 55-60%     Home Medications:  Prior to Admission medications  Medication Sig Start Date End Date Taking? Authorizing Provider  carvedilol (COREG) 12.5 MG tablet TAKE 1 TABLET BY MOUTH TWICE A DAY 02/27/21   Martinique, Peter M, MD  clopidogrel (PLAVIX) 75 MG tablet TAKE 1 TABLET BY MOUTH EVERY DAY 05/12/21   Martinique, Peter M, MD  desvenlafaxine (PRISTIQ) 50 MG 24 hr tablet Take 1 tablet (50 mg total) by mouth daily. 11/17/21   Arfeen, Arlyce Harman, MD  diclofenac sodium (VOLTAREN) 1 % GEL APPLY 2 GRAMS TO BOTH HANDS 4 TIMES A DAY 06/21/18   [provider]  Eyelid Cleansers (OCUSOFT BABY EYELID & EYELASH EX) Apply topically.    [provider]  furosemide (LASIX) 40 MG tablet TAKE 1 TABLET BY MOUTH EVERY DAY 09/29/21   Martinique, Peter M, MD  gabapentin (NEURONTIN) 100 MG capsule Take 200 mg by mouth See admin instructions. Take 1 in am and 1 in the lunch and 2 at bedtime 09/16/16   [provider]  HYDROcodone-acetaminophen (NORCO/VICODIN) 5-325 MG tablet Take 1 tablet by mouth 3 (three) times daily.    [provider]  montelukast (SINGULAIR) 10 MG tablet TAKE 1 TABLET BY MOUTH EVERYDAY AT BEDTIME 07/02/21   [name] Agreste,  MD  Multiple Vitamins-Minerals (VITAMIN D3 COMPLETE PO) Take by mouth.    [provider]  nitroGLYCERIN (NITROSTAT) 0.4 MG SL tablet PLACE 1 TABLET UNDER THE TONGUE EVERY 5 MINUTES AS NEEDED FOR CHEST PAIN 06/30/21   Martinique, Peter M, MD  Omega-3 Fatty Acids (FISH OIL) 1000 MG CAPS Take by mouth daily.    [provider]  ondansetron (ZOFRAN-ODT) 8 MG disintegrating tablet Take 1 tablet (8 mg total) by mouth every 8 (eight) hours as needed for nausea or vomiting. 11/13/20   [name] Agreste, MD  oxybutynin (DITROPAN-XL) 10 MG 24 hr tablet Take 1 tablet (10 mg total) by mouth daily. 07/02/21   [name] Agreste, MD  OXYGEN Inhale 1.5-3 L into the lungs as needed (for shortness of breath).    [provider]  pantoprazole (PROTONIX) 40 MG tablet TAKE 1 TABLET BY MOUTH EVERY DAY 11/28/21   [name] Agreste, MD  polyethylene glycol [facility] / GLYCOLAX) packet Take 17 g by mouth daily as needed for mild constipation.     [provider]  PRALUENT 75 MG/ML SOAJ INJECT 75 MG INTO THE SKIN EVERY 14 (FOURTEEN) DAYS. 07/28/21   Martinique, Peter M, MD  revefenacin [facility]) 175 MCG/3ML nebulizer solution Take 3 mLs (175 mcg total) by nebulization daily. 07/22/21   Brand Males, MD  spironolactone (ALDACTONE) 25 MG tablet TAKE 1 TABLET BY MOUTH EVERY DAY 03/10/21   Martinique, Peter M, MD  traZODone (DESYREL) 100 MG tablet Take one tab daily as needed for sleep 11/17/21   Arfeen, Arlyce Harman, MD  triamcinolone cream (KENALOG) 0.1 % SMARTSIG:1 Sparingly Topical Twice Daily PRN 04/18/20   [provider]  warfarin (COUMADIN) 5 MG tablet TAKE 1/2 TO 1 TABLET BY MOUTH EVERY DAY OR AS DIRECTED BY COUMADIN CLINIC 06/09/21   Martinique, Peter M, MD    Inpatient Medications: Scheduled Meds:  Continuous Infusions:  PRN Meds:   Allergies:    Allergies  Allergen Reactions   Alprazolam Anaphylaxis and Other (See Comments)    REACTION: stops breathing   Bee Venom Anaphylaxis   Iodine  Anaphylaxis, Swelling and Other (See Comments)    REACTION: swelling in throat   Pseudoephedrine Hcl Er Shortness Of Breath   Budesonide-Formoterol Fumarate Other (See Comments)  Blisters inside of mouth all over   Crestor [Rosuvastatin Calcium] Other (See Comments)    Unable to walk   Esomeprazole Magnesium Other (See Comments)    REACTION: "bouncing off walls"   Flonase [Fluticasone Propionate] Other (See Comments)    NOSE BLEED   Lamictal [Lamotrigine] Rash    Patient got rash, labored breathing, and diarrhea   Loratadine Other (See Comments)    claritin D causes shaking   Lotrimin [Clotrimazole] Other (See Comments)    Mouth blisters   Lunesta [Eszopiclone] Other (See Comments)    REACTION: "slept for a week"   Oxcarbazepine Other (See Comments)    Causes deep sleep and dizziness   Statins Other (See Comments)    Can't walk, legs won't work    Zolpidem Tartrate Other (See Comments)    REACTION: "slept for a week"   Betadine [Povidone Iodine] Other (See Comments)    Breathing problems   Bevespi Aerosphere [Glycopyrrolate-Formoterol] Other (See Comments)    Pt believes this caused mouth sores and thrush    Breztri Aerosphere [Budeson-Glycopyrrol-Formoterol] Other (See Comments)    Thrush   Clarithromycin Other (See Comments)    All "mycins", Puts into "a" fib, Will take if has to for severe sinus infection   Effexor [Venlafaxine] Nausea And Vomiting and Other (See Comments)    cramps   Lexapro [Escitalopram Oxalate] Other (See Comments)    hallucinations   Aciphex [Rabeprazole Sodium] Rash   Alendronate Sodium Other (See Comments)    "caused stomach problems for 3 days"   Avelox [Moxifloxacin Hcl In Nacl] Other (See Comments)    Stomach cramps.    Bextra [Valdecoxib] Rash   Ceclor [Cefaclor] Rash   Cephalexin Rash and Other (See Comments)    Pt states that she is possibly allergic to this - had a reaction to Cefaclor in the past and she does not want to these class  drugs. Added per patient request.   Covera-Hs [Verapamil Hcl] Palpitations   Dicyclomine Hcl Rash   Other Other (See Comments)    Glue from ekg/heart monitor leads --rash, Any MYCINS   Tessalon Perles Rash    Social History:   Social History   Socioeconomic History   Marital status: Divorced    Spouse name: Not on file   Number of children: 2   Years of education: Not on file   Highest education level: Not on file  Occupational History   Occupation: disability  Tobacco Use   Smoking status: Former    Packs/day: 3.00    Years: 30.00    Pack years: 90.00    Types: Cigarettes    Quit date: 06/02/2010    Years since quitting: 11.5   Smokeless tobacco: Never   Tobacco comments:    QUIT IN 2011  Vaping Use   Vaping Use: Never used  Substance and Sexual Activity   Alcohol use: No    Alcohol/week: 0.0 standard drinks   Drug use: No   Sexual activity: Yes    Partners: Male    Birth control/protection: Post-menopausal, Surgical  Other Topics Concern   Not on file  Social History Narrative   Marital status: divorced in 1978 after ten years; dating casually in 2019.      Children: 3 biological children; 3 court appointed children; 6 grandchildren; 5 gg      Lives: alone; children in [place] and [place].      Employment: retired age 72; disability for COPD, CVA at age 62.  Tobacco: former smoker; quit smoking 2011.      Alcohol: none      Exercise: walks dog four times per day; goes to pool three times per week.      ADLs: drives; no assistant devices; does have a walker.  Cleaning is limited in 2018.  Daughter helps with cleaning.  Does own grocery shopping.      Advanced Directives: YES; DNR/DNI; HCPOA: Rolan Lipa Martin/daughter youngest.  Blind.               Social Determinants of Health   Financial Resource Strain: Not on file  Food Insecurity: Not on file  Transportation Needs: Not on file  Physical Activity: Not on file  Stress: Not on file  Social  Connections: Not on file  Intimate Partner Violence: Not on file    Family History:    Family History  Problem Relation Age of Onset   Dementia Mother    Diabetes Mother    Alzheimer's disease Mother    Heart attack Brother 65   Heart attack Father    Schizophrenia Sister    Diabetes Sister    Tremor Sister      ROS:  Please see the history of present illness.   All other ROS reviewed and negative.     Physical Exam/Data:   Vitals:   12/06/21 0215 12/06/21 0300 12/06/21 0345 12/06/21 0500  BP: 135/70 (!) 142/80 (!) 123/59 (!) 112/55  Pulse: (!) 58 74 (!) 53 (!) 57  Resp: 19 19 (!) 25 17  Temp:      TempSrc:      SpO2: 90% 92% 96% 98%  Weight:      Height:       No intake or output data in the 24 hours ending 12/06/21 0540 Last 3 Weights 12/05/2021 11/13/2021 10/17/2021  Weight (lbs) 181 lb 179 lb 6.4 oz 178 lb 3.2 oz  Weight (kg) 82.101 kg 81.375 kg 80.831 kg  Some encounter information is confidential and restricted. Go to Review Flowsheets activity to see all data.     Body mass index is 31.07 kg/m.  General:  Well nourished, well developed, in no acute distress HEENT: normal Neck: no JVD Vascular: No carotid bruits; Distal pulses 2+ bilaterally Cardiac:  normal S1, S2; RRR; no murmur  Lungs:  b/l inspiratory crackles+ Abd: soft, nontender, no hepatomegaly  Ext: no edema Musculoskeletal:  No deformities, BUE and BLE strength normal and equal Skin: warm and dry  Neuro:  CNs 2-12 intact, no focal abnormalities noted Psych:  Normal affect   EKG: as noted above  Laboratory Data:  High Sensitivity Troponin:   Recent Labs  Lab 12/05/21 2132 12/06/21 0012 12/06/21 0312  TROPONINIHS 9 24* 36*     Chemistry Recent Labs  Lab 12/05/21 2132  NA 135  K 3.7  CL 96*  CO2 31  GLUCOSE 141*  BUN 18  CREATININE 1.09*  CALCIUM 9.5  GFRNONAA 53*  ANIONGAP 8    Recent Labs  Lab 12/05/21 2132  PROT 6.3*  ALBUMIN 3.9  AST 16  ALT 12  ALKPHOS 56   BILITOT 0.5   Lipids No results for input(s): CHOL, TRIG, HDL, LABVLDL, LDLCALC, CHOLHDL in the last 168 hours.  Hematology Recent Labs  Lab 12/05/21 2132  WBC 10.7*  RBC 4.23  HGB 13.2  HCT 39.4  MCV 93.1  MCH 31.2  MCHC 33.5  RDW 13.0  PLT 180   Thyroid No results for input(s): TSH,  FREET4 in the last 168 hours.  BNP Recent Labs  Lab 12/05/21 2331  BNP 63.0    DDimer No results for input(s): DDIMER in the last 168 hours.   Radiology/Studies:  DG Chest Portable 1 View  Result Date: 12/05/2021 CLINICAL DATA:  Pain, palpitations EXAM: PORTABLE CHEST 1 VIEW COMPARISON:  08/21/2021 FINDINGS: Cardiomegaly, vascular congestion. Diffuse interstitial prominence within the lungs, particularly lung bases with bibasilar airspace opacities. Favor edema. No effusions or pneumothorax. No acute bony abnormality. IMPRESSION: Cardiomegaly with vascular congestion.  Suspect early edema/CHF. Electronically Signed   By: Rolm Baptise M.D.   On: 12/05/2021 23:06     Assessment and Plan:   Paroxysmal afib RVR-> converted to NSR (On warfarin, INR 2.0), unable to afford NOACs 2. H/o HFrEF-> HFpEF, last ECHO 2018, EF 50-55% Not in exacerbation Elevated troponin 24/36 (without chest pain) likely demand from Afib RVR 3. Severe COPD/Chr respiratory failure on home 2lts at night 4. Obesity  5. Back pain HTN, HLD, severe PAD s/p left iliac stent in 2019 CAD s/p 2v CAD (100% CTO RCA, 75% OM1, 90% OM2-small vessel)- medically managed  Plan: - patient is converted to NSR spontaneously, on exam she is not in CHF and her BNP is negative, CXR does show vascular congestion ?perhaps fibrosis/severe COPD. She has no chest pain or exertional dyspnea/chf symptoms prior to this. Her Afib is symptomatic but this was triggered and spontaneously converted without needing medications. If her afib burden is high then can think of antiarrhythmics to keep her in NSR  - replace electrolytes, observe and ok for  discharge later this am - f/w Cardiology outpatient - need an updated ECHO  (in 2018 her AV was thickened, on today's exam- I did not appreciate any murmur) - continue plavix, coreg 12.5mg  BID, lasix 40mg  daily, spironolactone 25mg  daily and other home medications  - other mx per primary team.    Risk Assessment/Risk Scores:     HEAR Score (for undifferentiated chest pain):  HEAR Score: 6  New York Heart Association (NYHA) Functional Class NYHA Class II  CHA2DS2-VASc Score =   6  This indicates a  % annual risk of stroke. The patient's score is based upon:        For questions or updates, please contact [place] Please consult [email or web address] for contact info under    Signed, Renae Fickle, MD  12/06/2021 5:40 AM

## 2021-12-06 NOTE — ED Notes (Signed)
Patient requested blood draw from IV.

## 2021-12-06 NOTE — Assessment & Plan Note (Signed)
Assigned to observation telemetry bed.  Continue aspirin.  Continue home meds.  Management per cardiology.  In my opinion patient has likely demand ischemia from her rapid A. fib does not require admission as patient is back in normal sinus rhythm, is without any chest pain, is back to her clinical baseline.

## 2021-12-06 NOTE — Assessment & Plan Note (Signed)
Continue your home blood pressure medications.

## 2021-12-06 NOTE — ED Notes (Signed)
Admitting Provider at bedside. 

## 2021-12-06 NOTE — ED Notes (Signed)
Pt ambulatory to the bathroom and back to stretcher. No acute changes noted. Will continue to monitor.

## 2021-12-06 NOTE — Progress Notes (Signed)
ANTICOAGULATION CONSULT NOTE - Follow Up Consult  Pharmacy Consult for Warfarin Indication: atrial fibrillation  Allergies  Allergen Reactions   Alprazolam Anaphylaxis and Other (See Comments)    REACTION: stops breathing   Bee Venom Anaphylaxis   Iodine Anaphylaxis, Swelling and Other (See Comments)    REACTION: swelling in throat   Pseudoephedrine Hcl Er Shortness Of Breath   Budesonide-Formoterol Fumarate Other (See Comments)    Blisters inside of mouth all over   Crestor [Rosuvastatin Calcium] Other (See Comments)    Unable to walk   Esomeprazole Magnesium Other (See Comments)    REACTION: "bouncing off walls"   Flonase [Fluticasone Propionate] Other (See Comments)    NOSE BLEED   Lamictal [Lamotrigine] Rash    Patient got rash, labored breathing, and diarrhea   Loratadine Other (See Comments)    claritin D causes shaking   Lotrimin [Clotrimazole] Other (See Comments)    Mouth blisters   Lunesta [Eszopiclone] Other (See Comments)    REACTION: "slept for a week"   Oxcarbazepine Other (See Comments)    Causes deep sleep and dizziness   Statins Other (See Comments)    Can't walk, legs won't work    Zolpidem Tartrate Other (See Comments)    REACTION: "slept for a week"   Betadine [Povidone Iodine] Other (See Comments)    Breathing problems   Bevespi Aerosphere [Glycopyrrolate-Formoterol] Other (See Comments)    Pt believes this caused mouth sores and thrush    Breztri Aerosphere [Budeson-Glycopyrrol-Formoterol] Other (See Comments)    Thrush   Clarithromycin Other (See Comments)    All "mycins", Puts into "a" fib, Will take if has to for severe sinus infection   Effexor [Venlafaxine] Nausea And Vomiting and Other (See Comments)    cramps   Lexapro [Escitalopram Oxalate] Other (See Comments)    hallucinations   Aciphex [Rabeprazole Sodium] Rash   Alendronate Sodium Other (See Comments)    "caused stomach problems for 3 days"   Avelox [Moxifloxacin Hcl In Nacl] Other  (See Comments)    Stomach cramps.    Bextra [Valdecoxib] Rash   Ceclor [Cefaclor] Rash   Cephalexin Rash and Other (See Comments)    Pt states that she is possibly allergic to this - had a reaction to Cefaclor in the past and she does not want to these class drugs. Added per patient request.   Covera-Hs [Verapamil Hcl] Palpitations   Dicyclomine Hcl Rash   Other Other (See Comments)    Glue from ekg/heart monitor leads --rash, Any MYCINS   Tessalon Perles Rash    Patient Measurements: Height: 5\' 4"  (162.6 cm) Weight: 82.1 kg (181 lb) IBW/kg (Calculated) : 54.7   Vital Signs: Temp: 98.4 F (36.9 C) (02/03 2049) Temp Source: Oral (02/03 2049) BP: 124/60 (02/04 0545) Pulse Rate: 57 (02/04 0545)  Labs: Recent Labs    12/05/21 2132 12/06/21 0012 12/06/21 0312  HGB 13.2  --   --   HCT 39.4  --   --   PLT 180  --   --   LABPROT  --  22.7*  --   INR  --  2.0*  --   CREATININE 1.09*  --   --   TROPONINIHS 9 24* 36*    Estimated Creatinine Clearance: 46.3 mL/min (A) (by C-G formula based on SCr of 1.09 mg/dL (H)).  Assessment: 76 year old female on warfarin prior to admission INR 2 on admission Dose prior to admission 5 mg every Wed, 2.5 mg other  days  Goal of Therapy:  INR 2-3 Monitor platelets by anticoagulation protocol: Yes   Plan:  Warfarin 2.5 mg po x 1 dose today Daily INR  Thank you Anette Guarneri, PharmD  12/06/2021,6:32 AM

## 2021-12-06 NOTE — Assessment & Plan Note (Signed)
Chronic. 

## 2021-12-06 NOTE — Assessment & Plan Note (Signed)
Recovered EF.  Continue Lasix and Aldactone.  Beta peptide is normal.  Patient is not in acute heart failure.

## 2021-12-06 NOTE — ED Notes (Signed)
Assumed pt care at this time

## 2021-12-06 NOTE — ED Notes (Signed)
Pt ambulated to restroom and back.

## 2021-12-06 NOTE — Assessment & Plan Note (Signed)
Continue Neurontin and Norco.

## 2021-12-06 NOTE — ED Notes (Signed)
ED Provider at bedside. 

## 2021-12-06 NOTE — H&P (Signed)
History and Physical    Laurie Horton OHY:[id number] DOB: Jun 21, 1946 DOA: 12/05/2021  DOS: the patient was seen and examined on 12/05/2021  PCP: Merrilee Seashore, MD   Patient coming from: Home  I have personally briefly reviewed patient's old medical records in [place]  CC: palpitations HPI: 76 year old white female with a history of paroxysmal atrial fibrillation, on Coumadin, chronic hypoxic respiratory failure on 2 to 3 L of oxygen, COPD, chronic back pain, history of systolic heart failure, obesity presents to the ER today with palpitations.  Patient states that she received some bad news from her neighbor.  She started feeling palpitation in his chest.  She took several nitroglycerin which did not resolve her palpitations.  EMS was activated.  On arrival to the ER, patient was tachycardic with a heart rate of 139.  EKG showed rapid atrial fibrillation.  As the patient was trying to move her self up in the bed, she spontaneously converted herself to normal sinus rhythm.  Patient's initial cardiac markers were negative but after several hours, her third set of troponins now is up to 36.  Patient had a right and left heart catheterization back in 2018 which demonstrated obstructive cardiac disease with a 70% proximal OM1, 90% small OM 2.  She had EF of 25% at that time.  With medical management, her EF improved 4 months later to 55%.  Due to the elevated cardiac markers, cardiology wanted the patient admitted to hospital.  [facility] hospitalist contacted for admission.  Patient states that at no point during the day today that she had chest pain.  She did have palpitations.  She is current complaining of back pain.  She did not take her nightly hydrocodone or her Neurontin.  She still mildly nauseous.  Zofran has been not been effective.   ED Course: Was in rapid A. fib on admission to the ER.  Try to move her self up into the ER gurney and spontaneously converted to normal sinus  rhythm.  Cardiac markers are trending up.  Patient not have any chest pain.  Review of Systems:  Review of Systems  Constitutional: Negative.  Negative for chills, fever and weight loss.  HENT: Negative.  Negative for ear pain, hearing loss and tinnitus.   Eyes: Negative.   Respiratory:  Positive for shortness of breath.   Cardiovascular:  Positive for palpitations. Negative for chest pain.  Gastrointestinal:  Positive for nausea.  Genitourinary: Negative.  Negative for dysuria, frequency and urgency.  Musculoskeletal:  Positive for back pain.  Skin: Negative.   Neurological: Negative.   Endo/Heme/Allergies: Negative.   Psychiatric/Behavioral: Negative.    All other systems reviewed and are negative.  Past Medical History:  Diagnosis Date   Acute diastolic heart failure, NYHA class 1 ([place]) 02/08/2017   Acute heart failure ([place]) 02/08/2017   Cataract    OD   CHF (congestive heart failure) ([place]) 03/02/2017   EF 94-85% 4627   Complication of anesthesia    various issues with oxygen  saturations post op   COPD (chronic obstructive pulmonary disease) (HCC)    Depression    Depression    Phreesia 11/04/2020   Diverticulitis    DVT (deep venous thrombosis) (HCC)    Fibromyalgia    GERD (gastroesophageal reflux disease)    Heart murmur    Phreesia 11/04/2020   Hiatal hernia    History of deviated nasal septum    left- side   HTN (hypertension)    Hyperlipidemia  Hypertension    Phreesia 11/04/2020   Hypertensive retinopathy    OU   Myocardial infarction [facility])    Phreesia 11/04/2020   Obesity    On supplemental oxygen therapy    concentrator at night @ 1.5 l/m or when sleeps. O2 Sat niormally 87.   Osteoporosis    Osteoporosis    Phreesia 11/04/2020   Oxygen deficiency    Phreesia 11/04/2020   PAT (paroxysmal atrial tachycardia) (HCC)    PFO (patent foramen ovale)    PULMONARY NODULE, LEFT LOWER LOBE 10/14/2009   81mm LLL nodule dec 2010. Stable and 75mm in Oct 2012. No  further fu   PVD (peripheral vascular disease) with claudication ([place]) 12/2017   Right middle lobe pneumonia 07/24/2011   First noted at admit 07/10/11. Persists on cxr 07/22/11. Cleared on CT 08/24/11. No further followup   Stroke [facility])    TOBACCO ABUSE 06/04/2009    Past Surgical History:  Procedure Laterality Date   ABDOMINAL HYSTERECTOMY N/A    Phreesia 11/04/2020   CARDIOVASCULAR STRESS TEST  12/26/2004   EF 74%. NO EVIDENCE OF ISCHEMIA   CATARACT EXTRACTION Left    Dr. Elliot Dally   ESOPHAGOGASTRODUODENOSCOPY (EGD) WITH PROPOFOL N/A 04/15/2015   Procedure: ESOPHAGOGASTRODUODENOSCOPY (EGD) WITH PROPOFOL;  Surgeon: Laurence Spates, MD;  Location: [facility];  Service: Endoscopy;  Laterality: N/A;   EYE SURGERY Left    Cat Sx   JOINT REPLACEMENT N/A    Phreesia 11/04/2020   KNEE ARTHROSCOPY  2000   left   LAPAROSCOPIC CHOLECYSTECTOMY  04-16-2010   [place]   LOWER EXTREMITY ANGIOGRAPHY N/A 09/09/2017   Procedure: Lower Extremity Angiography;  Surgeon: Lorretta Harp, MD;  Location: [place] CV [facility];  Service: Cardiovascular;  Laterality: N/A;   LOWER EXTREMITY INTERVENTION Left 01/17/2018   Procedure: LOWER EXTREMITY INTERVENTION;  Surgeon: Lorretta Harp, MD;  Location: [place] [facility] LAB;  Service: Cardiovascular;  Laterality: Left;   MOUTH SURGERY     03-26-15 multiple extractions stitches remains   PERIPHERAL VASCULAR INTERVENTION Left 01/17/2018   Procedure: PERIPHERAL VASCULAR INTERVENTION;  Surgeon: Lorretta Harp, MD;  Location: [place] CV LAB;  Service: Cardiovascular;  Laterality: Left;  COMMON ILIAC   RIGHT/LEFT HEART CATH AND CORONARY ANGIOGRAPHY N/A 03/04/2017   Procedure: Right/Left Heart Cath and Coronary Angiography;  Surgeon: Peter M Martinique, MD;  Location: [place] [facility];  Service: Cardiovascular;  Laterality: N/A;   TOTAL ABDOMINAL HYSTERECTOMY     post op needed oxygen was told "she gave them a scare"   TUBAL LIGATION     US ECHOCARDIOGRAPHY  11/20/2009    EF 55-60%     reports that she quit smoking about 11 years ago. Her smoking use included cigarettes. She has a 90.00 pack-year smoking history. She has never used smokeless tobacco. She reports that she does not drink alcohol and does not use drugs.  Allergies  Allergen Reactions   Alprazolam Anaphylaxis and Other (See Comments)    REACTION: stops breathing   Bee Venom Anaphylaxis   Iodine Anaphylaxis, Swelling and Other (See Comments)    REACTION: swelling in throat   Pseudoephedrine Hcl Er Shortness Of Breath   Budesonide-Formoterol Fumarate Other (See Comments)    Blisters inside of mouth all over   Crestor [Rosuvastatin Calcium] Other (See Comments)    Unable to walk   Esomeprazole Magnesium Other (See Comments)    REACTION: "bouncing off walls"   Flonase [Fluticasone Propionate] Other (See Comments)  NOSE BLEED   Lamictal [Lamotrigine] Rash    Patient got rash, labored breathing, and diarrhea   Loratadine Other (See Comments)    claritin D causes shaking   Lotrimin [Clotrimazole] Other (See Comments)    Mouth blisters   Lunesta [Eszopiclone] Other (See Comments)    REACTION: "slept for a week"   Oxcarbazepine Other (See Comments)    Causes deep sleep and dizziness   Statins Other (See Comments)    Can't walk, legs won't work    Zolpidem Tartrate Other (See Comments)    REACTION: "slept for a week"   Betadine [Povidone Iodine] Other (See Comments)    Breathing problems   Bevespi Aerosphere [Glycopyrrolate-Formoterol] Other (See Comments)    Pt believes this caused mouth sores and thrush    Breztri Aerosphere [Budeson-Glycopyrrol-Formoterol] Other (See Comments)    Thrush   Clarithromycin Other (See Comments)    All "mycins", Puts into "a" fib, Will take if has to for severe sinus infection   Effexor [Venlafaxine] Nausea And Vomiting and Other (See Comments)    cramps   Lexapro [Escitalopram Oxalate] Other (See Comments)    hallucinations   Aciphex [Rabeprazole  Sodium] Rash   Alendronate Sodium Other (See Comments)    "caused stomach problems for 3 days"   Avelox [Moxifloxacin Hcl In Nacl] Other (See Comments)    Stomach cramps.    Bextra [Valdecoxib] Rash   Ceclor [Cefaclor] Rash   Cephalexin Rash and Other (See Comments)    Pt states that she is possibly allergic to this - had a reaction to Cefaclor in the past and she does not want to these class drugs. Added per patient request.   Covera-Hs [Verapamil Hcl] Palpitations   Dicyclomine Hcl Rash   Other Other (See Comments)    Glue from ekg/heart monitor leads --rash, Any MYCINS   Tessalon Perles Rash    Family History  Problem Relation Age of Onset   Dementia Mother    Diabetes Mother    Alzheimer's disease Mother    Heart attack Brother 36   Heart attack Father    Schizophrenia Sister    Diabetes Sister    Tremor Sister     Prior to Admission medications   Medication Sig Start Date End Date Taking? Authorizing Provider  carvedilol (COREG) 12.5 MG tablet TAKE 1 TABLET BY MOUTH TWICE A DAY 02/27/21   Martinique, Peter M, MD  clopidogrel (PLAVIX) 75 MG tablet TAKE 1 TABLET BY MOUTH EVERY DAY 05/12/21   Martinique, Peter M, MD  desvenlafaxine (PRISTIQ) 50 MG 24 hr tablet Take 1 tablet (50 mg total) by mouth daily. 11/17/21   Arfeen, Arlyce Harman, MD  diclofenac sodium (VOLTAREN) 1 % GEL APPLY 2 GRAMS TO BOTH HANDS 4 TIMES A DAY 06/21/18   [provider]  Eyelid Cleansers (OCUSOFT BABY EYELID & EYELASH EX) Apply topically.    [provider]  furosemide (LASIX) 40 MG tablet TAKE 1 TABLET BY MOUTH EVERY DAY 09/29/21   Martinique, Peter M, MD  gabapentin (NEURONTIN) 100 MG capsule Take 200 mg by mouth See admin instructions. Take 1 in am and 1 in the lunch and 2 at bedtime 09/16/16   [provider]  HYDROcodone-acetaminophen (NORCO/VICODIN) 5-325 MG tablet Take 1 tablet by mouth 3 (three) times daily.    [provider]  montelukast (SINGULAIR) 10 MG tablet TAKE 1 TABLET BY  MOUTH EVERYDAY AT BEDTIME 07/02/21   [name] Agreste, MD  Multiple Vitamins-Minerals (VITAMIN D3 COMPLETE  PO) Take by mouth.    [provider]  nitroGLYCERIN (NITROSTAT) 0.4 MG SL tablet PLACE 1 TABLET UNDER THE TONGUE EVERY 5 MINUTES AS NEEDED FOR CHEST PAIN 06/30/21   Martinique, Peter M, MD  Omega-3 Fatty Acids (FISH OIL) 1000 MG CAPS Take by mouth daily.    [provider]  ondansetron (ZOFRAN-ODT) 8 MG disintegrating tablet Take 1 tablet (8 mg total) by mouth every 8 (eight) hours as needed for nausea or vomiting. 11/13/20   [name] Agreste, MD  oxybutynin (DITROPAN-XL) 10 MG 24 hr tablet Take 1 tablet (10 mg total) by mouth daily. 07/02/21   [name] Agreste, MD  OXYGEN Inhale 1.5-3 L into the lungs as needed (for shortness of breath).    [provider]  pantoprazole (PROTONIX) 40 MG tablet TAKE 1 TABLET BY MOUTH EVERY DAY 11/28/21   [name] Agreste, MD  polyethylene glycol [facility] / GLYCOLAX) packet Take 17 g by mouth daily as needed for mild constipation.     [provider]  PRALUENT 75 MG/ML SOAJ INJECT 75 MG INTO THE SKIN EVERY 14 (FOURTEEN) DAYS. 07/28/21   Martinique, Peter M, MD  revefenacin [facility]) 175 MCG/3ML nebulizer solution Take 3 mLs (175 mcg total) by nebulization daily. 07/22/21   Brand Males, MD  spironolactone (ALDACTONE) 25 MG tablet TAKE 1 TABLET BY MOUTH EVERY DAY 03/10/21   Martinique, Peter M, MD  traZODone (DESYREL) 100 MG tablet Take one tab daily as needed for sleep 11/17/21   Arfeen, Arlyce Harman, MD  triamcinolone cream (KENALOG) 0.1 % SMARTSIG:1 Sparingly Topical Twice Daily PRN 04/18/20   [provider]  warfarin (COUMADIN) 5 MG tablet TAKE 1/2 TO 1 TABLET BY MOUTH EVERY DAY OR AS DIRECTED BY COUMADIN CLINIC 06/09/21   Martinique, Peter M, MD    Physical Exam: Vitals:   12/06/21 0215 12/06/21 0300 12/06/21 0345 12/06/21 0500  BP: 135/70 (!) 142/80 (!) 123/59 (!) 112/55  Pulse: (!) 58 74 (!) 53 (!) 57  Resp: 19 19 (!) 25 17   Temp:      TempSrc:      SpO2: 90% 92% 96% 98%  Weight:      Height:        Physical Exam Vitals and nursing note reviewed.  Constitutional:      General: She is not in acute distress.    Appearance: She is obese. She is not toxic-appearing or diaphoretic.     Comments: Chronically ill-appearing female obese  HENT:     Head: Normocephalic and atraumatic.     Nose: Nose normal. No rhinorrhea.  Eyes:     General:        Right eye: No discharge.        Left eye: No discharge.  Cardiovascular:     Rate and Rhythm: Normal rate and regular rhythm.     Pulses: Normal pulses.  Pulmonary:     Effort: Pulmonary effort is normal. No respiratory distress.     Breath sounds: No wheezing, rhonchi or rales.  Abdominal:     General: Bowel sounds are normal. There is no distension.     Palpations: Abdomen is soft.     Tenderness: There is no abdominal tenderness. There is no guarding or rebound.  Musculoskeletal:     Right lower leg: No edema.     Left lower leg: No edema.  Skin:    General: Skin is warm and dry.     Capillary Refill: Capillary refill takes  less than 2 seconds.  Neurological:     General: No focal deficit present.     Mental Status: She is alert and oriented to person, place, and time.     Labs on Admission: I have personally reviewed following labs and imaging studies  CBC: Recent Labs  Lab 12/05/21 2132  WBC 10.7*  NEUTROABS 8.7*  HGB 13.2  HCT 39.4  MCV 93.1  PLT 194   Basic Metabolic Panel: Recent Labs  Lab 12/05/21 2132  NA 135  K 3.7  CL 96*  CO2 31  GLUCOSE 141*  BUN 18  CREATININE 1.09*  CALCIUM 9.5   GFR: Estimated Creatinine Clearance: 46.3 mL/min (A) (by C-G formula based on SCr of 1.09 mg/dL (H)). Liver Function Tests: Recent Labs  Lab 12/05/21 2132  AST 16  ALT 12  ALKPHOS 56  BILITOT 0.5  PROT 6.3*  ALBUMIN 3.9   No results for input(s): LIPASE, AMYLASE in the last 168 hours. No results for input(s): AMMONIA in the last  168 hours. Coagulation Profile: Recent Labs  Lab 12/06/21 0012  INR 2.0*   Cardiac Enzymes: No results for input(s): CKTOTAL, CKMB, CKMBINDEX, TROPONINI in the last 168 hours. BNP (last 3 results) Recent Labs    07/16/21 1226  PROBNP 363   HbA1C: No results for input(s): HGBA1C in the last 72 hours. CBG: No results for input(s): GLUCAP in the last 168 hours. Lipid Profile: No results for input(s): CHOL, HDL, LDLCALC, TRIG, CHOLHDL, LDLDIRECT in the last 72 hours. Thyroid Function Tests: No results for input(s): TSH, T4TOTAL, FREET4, T3FREE, THYROIDAB in the last 72 hours. Anemia Panel: No results for input(s): VITAMINB12, FOLATE, FERRITIN, TIBC, IRON, RETICCTPCT in the last 72 hours. Urine analysis:    Component Value Date/Time   COLORURINE YELLOW 09/06/2016 1401   APPEARANCEUR CLEAR 09/06/2016 1401   LABSPEC 1.011 09/06/2016 1401   PHURINE 8.5 (H) 09/06/2016 1401   GLUCOSEU NEGATIVE 09/06/2016 1401   HGBUR NEGATIVE 09/06/2016 1401   BILIRUBINUR negative 05/22/2019 0824   KETONESUR negative 05/22/2019 [place] 09/06/2016 1401   PROTEINUR negative 05/22/2019 0824   PROTEINUR NEGATIVE 09/06/2016 1401   UROBILINOGEN 0.2 05/22/2019 0824   UROBILINOGEN 0.2 05/04/2013 1132   NITRITE Negative 05/22/2019 0824   NITRITE NEGATIVE 09/06/2016 1401   LEUKOCYTESUR Negative 05/22/2019 0824    Radiological Exams on Admission: I have personally reviewed images DG Chest Portable 1 View  Result Date: 12/05/2021 CLINICAL DATA:  Pain, palpitations EXAM: PORTABLE CHEST 1 VIEW COMPARISON:  08/21/2021 FINDINGS: Cardiomegaly, vascular congestion. Diffuse interstitial prominence within the lungs, particularly lung bases with bibasilar airspace opacities. Favor edema. No effusions or pneumothorax. No acute bony abnormality. IMPRESSION: Cardiomegaly with vascular congestion.  Suspect early edema/CHF. Electronically Signed   By: Rolm Baptise M.D.   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  Continue aspirin.  Continue home meds.  Management per cardiology.  In my opinion patient has likely demand ischemia from her rapid A. fib does not require admission as patient is back in normal sinus rhythm, is without any chest pain, is back to her clinical baseline.  Rapid atrial fibrillation ([place])- (present on admission) Resolved spontaneously in the ER.  Likely the patient had a Valsalva maneuver as she was trying to move herself up into the ER gurney.  Potassium is less than 4 we will give her 40 mEq of p.o. potassium along with 2 g of IV magnesium.  Patient's already on Coumadin at home.  INR is therapeutic.  Continue Coreg.  Class 1 obesity due to excess calories with serious comorbidity and body mass index (BMI) of 31.0 to 31.9 in adult Chronic.  Chronic systolic CHF (congestive heart failure) ([place])- (present on admission) Recovered EF.  Continue Lasix and Aldactone.  Beta peptide is normal.  Patient is not in acute heart failure.  COPD, severe ([place])- (present on admission) Stable.  Without exacerbation.  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  Long term current use of anticoagulant therapy Continue Coumadin for history of A.  fib  PAF (paroxysmal atrial fibrillation) ([place])- (present on admission) Is on Coumadin, Coreg  Chronic respiratory failure with hypoxia ([place])- (present on admission) Continue supplemental oxygen.  Essential hypertension- (present on admission) Continue your home blood pressure medications.    DVT prophylaxis: Coumadin Code Status: Full Code Family Communication: no family at bedside  Disposition Plan: return home  Consults called: cardiology  Admission status: Observation, Telemetry bed   Kristopher Oppenheim, DO [facility] 12/06/2021, 5:48 AM

## 2021-12-06 NOTE — ED Notes (Signed)
Deardra, pt's daughter, given update on plan of care and expected D/C later this afternoon. Daughter would like a call when she is D/C or if anything significant happens. [phone]

## 2021-12-06 NOTE — Progress Notes (Signed)
°  Echocardiogram 2D Echocardiogram has been performed.  Merrie Roof F 12/06/2021, 10:29 AM

## 2021-12-06 NOTE — ED Notes (Signed)
Pt ambulated to restroom and back.  Pt states she does not need O2 to ambulate to the restroom and back.  Pt was at 86% RA upon returning from restroom with out O2.  Pt placed back on O2 and quickly was at 94% on 3LNC.

## 2021-12-06 NOTE — ED Notes (Signed)
Pt to ECHO

## 2021-12-06 NOTE — Discharge Summary (Signed)
83.9 ml 44.74 ml/m  AORTIC VALVE AV Vmax:           134.00 cm/s AV Vmean:          93.700 cm/s AV VTI:            0.332 m AV Peak Grad:      7.2 mmHg AV Mean Grad:      4.0 mmHg LVOT Vmax:         91.90 cm/s LVOT Vmean:        55.900 cm/s LVOT VTI:          0.198 m LVOT/AV VTI ratio: 0.60  AORTA Ao Root diam: 4.20 cm Ao Asc diam:  3.40 cm MITRAL VALVE MV Area (PHT): 2.87 cm    SHUNTS MV Decel Time: 264 msec    Systemic VTI: 0.20 m MV E velocity: 80.80 cm/s MV A velocity: 71.60 cm/s MV E/A ratio:  1.13 Jenkins Rouge MD Electronically signed by Jenkins Rouge MD Signature Date/Time: 12/06/2021/11:27:54 AM    Final    VAS US CAROTID  Result Date: 11/27/2021 Carotid Arterial Duplex Study Patient Name:  Laurie Horton  Date of Exam:   11/27/2021 Medical Rec #: [id number]          Accession #:    [id number] Date of Birth: Mar 25, 1946           Patient Gender: F Patient Age:   76 years Exam Location:  [facility] Procedure:      VAS US CAROTID Referring Phys: Roderic Palau BERRY --------------------------------------------------------------------------------  Indications:       Carotid artery disease and left subclavian steal follow-up.                    Patient denies any cerebrovascular symptoms at this time.                    Medical management recommended for subclavian disease. Risk Factors:      Hypertension, hyperlipidemia, past history of smoking,                    coronary artery disease, PAD. Limitations        Today's exam was limited due to heavy calcification and the                     resulting shadowing and the patient's respiratory variation. Comparison Study:  Previous carotid duplex performed 11/27/20 showed RICA                    velocities of 767/34 cm/sec and LICA velocities of 193/79                    cm/sec. Left subclavian artery monophasic and retrograde left                    vertebral artery consistent with known subclavian steal                    syndrome. Performing Technologist: Mariane Masters RVT  Examination Guidelines: A complete evaluation includes B-mode imaging, spectral Doppler, color Doppler, and power Doppler as needed of all accessible portions of each vessel. Bilateral testing is considered an integral part of a complete examination. Limited examinations for reoccurring indications may be performed as noted.  Right Carotid Findings: +----------+--------+--------+--------+------------------+--------+             PSV cm/s EDV cm/s Stenosis Plaque Description Comments  +----------+--------+--------+--------+------------------+--------+  Physician Discharge Summary  Laurie Horton OMV:[id number] DOB: 01-11-1946 DOA: 12/05/2021  PCP: Merrilee Seashore, MD  Admit date: 12/05/2021 Discharge date: 12/06/2021   Discharge Diagnoses:  Principal Problem:   Elevated troponin Active Problems:   Essential hypertension   Chronic respiratory failure with hypoxia (HCC)   PAF (paroxysmal atrial fibrillation) (HCC)   Long term current use of anticoagulant therapy   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  Patient states that she received some bad news from her neighbor.  She started feeling palpitation in his chest.  She took several nitroglycerin which did not resolve her palpitations.  EMS was activated.   On arrival to the ER, patient was tachycardic with a heart rate of 139.  EKG showed rapid atrial fibrillation.  As the patient was trying to move her self up in the bed, she spontaneously converted herself to normal sinus rhythm.  Patient's initial cardiac markers were negative but after several hours, her third set of troponins now is up to 36.  Patient had a right and left heart catheterization back in 2018 which demonstrated obstructive cardiac disease with a 70% proximal OM1, 90% small OM 2.  She had EF of 25% at that time.  With medical management, her EF improved 4 months later to 55%.   Due to the elevated cardiac  markers, cardiology wanted the patient admitted to hospital.   [facility] hospitalist contacted for admission.   Patient states that at no point during the day today that she had chest pain.  She did have palpitations.   She is current complaining of back pain.  She did not take her nightly hydrocodone or her Neurontin.  She still mildly nauseous.  Zofran has been not been effective  Hospital Course:  Patient's A-fib with RVR resolved with Valsalva effect.  Cardiology was consulted recommended no further work-up she did have gray zone troponin which were flat.  No further cardiac work-up recommended.  She did have a cardiac echo that was done and final report is pending at the time of discharge.  No medication changes were advised either.  I went over how to do the Valsalva maneuver along with carotid massage with the patient for next time to use at home to try to resolve her A-fib with RVR.  If not she can continue with utilizing emergency services.  Patient is being discharged to follow-up with primary care physician in 1 to 2 weeks and with cardiology in 2 to 4 weeks.   Discharge Exam: Vitals:   12/06/21 0845 12/06/21 1100  BP:  (!) 135/105  Pulse: (!) 57 66  Resp: 20 15  Temp:    SpO2: 94% 94%    General: Alert and oriented x4 no apparent distress Cardiovascular: Regular rate and rhythm without murmurs rubs or gallops Respiratory: Clear to auscultation bilaterally no wheezes rhonchi rales  Discharge  83.9 ml 44.74 ml/m  AORTIC VALVE AV Vmax:           134.00 cm/s AV Vmean:          93.700 cm/s AV VTI:            0.332 m AV Peak Grad:      7.2 mmHg AV Mean Grad:      4.0 mmHg LVOT Vmax:         91.90 cm/s LVOT Vmean:        55.900 cm/s LVOT VTI:          0.198 m LVOT/AV VTI ratio: 0.60  AORTA Ao Root diam: 4.20 cm Ao Asc diam:  3.40 cm MITRAL VALVE MV Area (PHT): 2.87 cm    SHUNTS MV Decel Time: 264 msec    Systemic VTI: 0.20 m MV E velocity: 80.80 cm/s MV A velocity: 71.60 cm/s MV E/A ratio:  1.13 Jenkins Rouge MD Electronically signed by Jenkins Rouge MD Signature Date/Time: 12/06/2021/11:27:54 AM    Final    VAS US CAROTID  Result Date: 11/27/2021 Carotid Arterial Duplex Study Patient Name:  Laurie Horton  Date of Exam:   11/27/2021 Medical Rec #: [id number]          Accession #:    [id number] Date of Birth: Mar 25, 1946           Patient Gender: F Patient Age:   76 years Exam Location:  [facility] Procedure:      VAS US CAROTID Referring Phys: Roderic Palau BERRY --------------------------------------------------------------------------------  Indications:       Carotid artery disease and left subclavian steal follow-up.                    Patient denies any cerebrovascular symptoms at this time.                    Medical management recommended for subclavian disease. Risk Factors:      Hypertension, hyperlipidemia, past history of smoking,                    coronary artery disease, PAD. Limitations        Today's exam was limited due to heavy calcification and the                     resulting shadowing and the patient's respiratory variation. Comparison Study:  Previous carotid duplex performed 11/27/20 showed RICA                    velocities of 767/34 cm/sec and LICA velocities of 193/79                    cm/sec. Left subclavian artery monophasic and retrograde left                    vertebral artery consistent with known subclavian steal                    syndrome. Performing Technologist: Mariane Masters RVT  Examination Guidelines: A complete evaluation includes B-mode imaging, spectral Doppler, color Doppler, and power Doppler as needed of all accessible portions of each vessel. Bilateral testing is considered an integral part of a complete examination. Limited examinations for reoccurring indications may be performed as noted.  Right Carotid Findings: +----------+--------+--------+--------+------------------+--------+             PSV cm/s EDV cm/s Stenosis Plaque Description Comments  +----------+--------+--------+--------+------------------+--------+  dizziness   Statins Other (See Comments)    Can't walk, legs won't work    Zolpidem Tartrate Other (See Comments)    REACTION: "slept for a week"   Betadine [Povidone Iodine] Other (See Comments)    Breathing problems   Bevespi Aerosphere [Glycopyrrolate-Formoterol] Other (See Comments)    Pt believes this caused mouth sores and thrush    Breztri Aerosphere [Budeson-Glycopyrrol-Formoterol] Other (See Comments)    Thrush   Clarithromycin Other (See Comments)    All "mycins", Puts into "a" fib, Will take if has to for severe sinus infection   Effexor [Venlafaxine] Nausea And Vomiting and Other (See Comments)    cramps   Lexapro [Escitalopram Oxalate] Other (See Comments)    hallucinations   Aciphex [Rabeprazole Sodium] Rash   Alendronate Sodium Other (See Comments)    "caused stomach problems for 3 days"   Avelox [Moxifloxacin Hcl In Nacl] Other (See Comments)    Stomach cramps.    Bextra [Valdecoxib] Rash   Ceclor [Cefaclor] Rash   Cephalexin Rash and Other (See Comments)    Pt states that she is possibly allergic to this - had a reaction to Cefaclor in the past and she does not want to these class drugs. Added per patient request.   Covera-Hs [Verapamil Hcl] Palpitations   Dicyclomine Hcl Rash   Other Other (See Comments)    Glue from ekg/heart monitor leads --rash, Any MYCINS   Tessalon Perles Rash      The results of significant diagnostics from this hospitalization (including imaging, microbiology, ancillary and laboratory) are listed below for reference.    Significant Diagnostic Studies: DG Chest Portable 1 View  Result Date: 12/05/2021 CLINICAL DATA:  Pain, palpitations EXAM: PORTABLE CHEST 1 VIEW COMPARISON:  08/21/2021 FINDINGS:  Cardiomegaly, vascular congestion. Diffuse interstitial prominence within the lungs, particularly lung bases with bibasilar airspace opacities. Favor edema. No effusions or pneumothorax. No acute bony abnormality. IMPRESSION: Cardiomegaly with vascular congestion.  Suspect early edema/CHF. Electronically Signed   By: Rolm Baptise M.D.   On: 12/05/2021 23:06   ECHOCARDIOGRAM COMPLETE  Result Date: 12/06/2021    ECHOCARDIOGRAM REPORT   Patient Name:   Laurie Horton Date of Exam: 12/06/2021 Medical Rec #:  [id number]         Height:       64.0 in Accession #:    [id number]        Weight:       181.0 lb Date of Birth:  1945/11/05          BSA:          1.875 m Patient Age:    82 years          BP:           124/101 mmHg Patient Gender: F                 HR:           61 bpm. Exam Location:  [facility]patient Procedure: 2D Echo, Cardiac Doppler and Color Doppler Indications:    Abnormal ECG R94.31  History:        Patient has prior history of Echocardiogram examinations, most                 recent 07/08/2017. Arrythmias:Atrial Fibrillation; Risk                 Factors:Diabetes and Hypertension. Elevated troponins.  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  Patient states that she received some bad news from her neighbor.  She started feeling palpitation in his chest.  She took several nitroglycerin which did not resolve her palpitations.  EMS was activated.   On arrival to the ER, patient was tachycardic with a heart rate of 139.  EKG showed rapid atrial fibrillation.  As the patient was trying to move her self up in the bed, she spontaneously converted herself to normal sinus rhythm.  Patient's initial cardiac markers were negative but after several hours, her third set of troponins now is up to 36.  Patient had a right and left heart catheterization back in 2018 which demonstrated obstructive cardiac disease with a 70% proximal OM1, 90% small OM 2.  She had EF of 25% at that time.  With medical management, her EF improved 4 months later to 55%.   Due to the elevated cardiac  markers, cardiology wanted the patient admitted to hospital.   [facility] hospitalist contacted for admission.   Patient states that at no point during the day today that she had chest pain.  She did have palpitations.   She is current complaining of back pain.  She did not take her nightly hydrocodone or her Neurontin.  She still mildly nauseous.  Zofran has been not been effective  Hospital Course:  Patient's A-fib with RVR resolved with Valsalva effect.  Cardiology was consulted recommended no further work-up she did have gray zone troponin which were flat.  No further cardiac work-up recommended.  She did have a cardiac echo that was done and final report is pending at the time of discharge.  No medication changes were advised either.  I went over how to do the Valsalva maneuver along with carotid massage with the patient for next time to use at home to try to resolve her A-fib with RVR.  If not she can continue with utilizing emergency services.  Patient is being discharged to follow-up with primary care physician in 1 to 2 weeks and with cardiology in 2 to 4 weeks.   Discharge Exam: Vitals:   12/06/21 0845 12/06/21 1100  BP:  (!) 135/105  Pulse: (!) 57 66  Resp: 20 15  Temp:    SpO2: 94% 94%    General: Alert and oriented x4 no apparent distress Cardiovascular: Regular rate and rhythm without murmurs rubs or gallops Respiratory: Clear to auscultation bilaterally no wheezes rhonchi rales  Discharge  83.9 ml 44.74 ml/m  AORTIC VALVE AV Vmax:           134.00 cm/s AV Vmean:          93.700 cm/s AV VTI:            0.332 m AV Peak Grad:      7.2 mmHg AV Mean Grad:      4.0 mmHg LVOT Vmax:         91.90 cm/s LVOT Vmean:        55.900 cm/s LVOT VTI:          0.198 m LVOT/AV VTI ratio: 0.60  AORTA Ao Root diam: 4.20 cm Ao Asc diam:  3.40 cm MITRAL VALVE MV Area (PHT): 2.87 cm    SHUNTS MV Decel Time: 264 msec    Systemic VTI: 0.20 m MV E velocity: 80.80 cm/s MV A velocity: 71.60 cm/s MV E/A ratio:  1.13 Jenkins Rouge MD Electronically signed by Jenkins Rouge MD Signature Date/Time: 12/06/2021/11:27:54 AM    Final    VAS US CAROTID  Result Date: 11/27/2021 Carotid Arterial Duplex Study Patient Name:  Laurie Horton  Date of Exam:   11/27/2021 Medical Rec #: [id number]          Accession #:    [id number] Date of Birth: Mar 25, 1946           Patient Gender: F Patient Age:   76 years Exam Location:  [facility] Procedure:      VAS US CAROTID Referring Phys: Roderic Palau BERRY --------------------------------------------------------------------------------  Indications:       Carotid artery disease and left subclavian steal follow-up.                    Patient denies any cerebrovascular symptoms at this time.                    Medical management recommended for subclavian disease. Risk Factors:      Hypertension, hyperlipidemia, past history of smoking,                    coronary artery disease, PAD. Limitations        Today's exam was limited due to heavy calcification and the                     resulting shadowing and the patient's respiratory variation. Comparison Study:  Previous carotid duplex performed 11/27/20 showed RICA                    velocities of 767/34 cm/sec and LICA velocities of 193/79                    cm/sec. Left subclavian artery monophasic and retrograde left                    vertebral artery consistent with known subclavian steal                    syndrome. Performing Technologist: Mariane Masters RVT  Examination Guidelines: A complete evaluation includes B-mode imaging, spectral Doppler, color Doppler, and power Doppler as needed of all accessible portions of each vessel. Bilateral testing is considered an integral part of a complete examination. Limited examinations for reoccurring indications may be performed as noted.  Right Carotid Findings: +----------+--------+--------+--------+------------------+--------+             PSV cm/s EDV cm/s Stenosis Plaque Description Comments  +----------+--------+--------+--------+------------------+--------+  Physician Discharge Summary  Laurie Horton OMV:[id number] DOB: 01-11-1946 DOA: 12/05/2021  PCP: Merrilee Seashore, MD  Admit date: 12/05/2021 Discharge date: 12/06/2021   Discharge Diagnoses:  Principal Problem:   Elevated troponin Active Problems:   Essential hypertension   Chronic respiratory failure with hypoxia (HCC)   PAF (paroxysmal atrial fibrillation) (HCC)   Long term current use of anticoagulant therapy   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  Patient states that she received some bad news from her neighbor.  She started feeling palpitation in his chest.  She took several nitroglycerin which did not resolve her palpitations.  EMS was activated.   On arrival to the ER, patient was tachycardic with a heart rate of 139.  EKG showed rapid atrial fibrillation.  As the patient was trying to move her self up in the bed, she spontaneously converted herself to normal sinus rhythm.  Patient's initial cardiac markers were negative but after several hours, her third set of troponins now is up to 36.  Patient had a right and left heart catheterization back in 2018 which demonstrated obstructive cardiac disease with a 70% proximal OM1, 90% small OM 2.  She had EF of 25% at that time.  With medical management, her EF improved 4 months later to 55%.   Due to the elevated cardiac  markers, cardiology wanted the patient admitted to hospital.   [facility] hospitalist contacted for admission.   Patient states that at no point during the day today that she had chest pain.  She did have palpitations.   She is current complaining of back pain.  She did not take her nightly hydrocodone or her Neurontin.  She still mildly nauseous.  Zofran has been not been effective  Hospital Course:  Patient's A-fib with RVR resolved with Valsalva effect.  Cardiology was consulted recommended no further work-up she did have gray zone troponin which were flat.  No further cardiac work-up recommended.  She did have a cardiac echo that was done and final report is pending at the time of discharge.  No medication changes were advised either.  I went over how to do the Valsalva maneuver along with carotid massage with the patient for next time to use at home to try to resolve her A-fib with RVR.  If not she can continue with utilizing emergency services.  Patient is being discharged to follow-up with primary care physician in 1 to 2 weeks and with cardiology in 2 to 4 weeks.   Discharge Exam: Vitals:   12/06/21 0845 12/06/21 1100  BP:  (!) 135/105  Pulse: (!) 57 66  Resp: 20 15  Temp:    SpO2: 94% 94%    General: Alert and oriented x4 no apparent distress Cardiovascular: Regular rate and rhythm without murmurs rubs or gallops Respiratory: Clear to auscultation bilaterally no wheezes rhonchi rales  Discharge  83.9 ml 44.74 ml/m  AORTIC VALVE AV Vmax:           134.00 cm/s AV Vmean:          93.700 cm/s AV VTI:            0.332 m AV Peak Grad:      7.2 mmHg AV Mean Grad:      4.0 mmHg LVOT Vmax:         91.90 cm/s LVOT Vmean:        55.900 cm/s LVOT VTI:          0.198 m LVOT/AV VTI ratio: 0.60  AORTA Ao Root diam: 4.20 cm Ao Asc diam:  3.40 cm MITRAL VALVE MV Area (PHT): 2.87 cm    SHUNTS MV Decel Time: 264 msec    Systemic VTI: 0.20 m MV E velocity: 80.80 cm/s MV A velocity: 71.60 cm/s MV E/A ratio:  1.13 Jenkins Rouge MD Electronically signed by Jenkins Rouge MD Signature Date/Time: 12/06/2021/11:27:54 AM    Final    VAS US CAROTID  Result Date: 11/27/2021 Carotid Arterial Duplex Study Patient Name:  Laurie Horton  Date of Exam:   11/27/2021 Medical Rec #: [id number]          Accession #:    [id number] Date of Birth: Mar 25, 1946           Patient Gender: F Patient Age:   76 years Exam Location:  [facility] Procedure:      VAS US CAROTID Referring Phys: Roderic Palau BERRY --------------------------------------------------------------------------------  Indications:       Carotid artery disease and left subclavian steal follow-up.                    Patient denies any cerebrovascular symptoms at this time.                    Medical management recommended for subclavian disease. Risk Factors:      Hypertension, hyperlipidemia, past history of smoking,                    coronary artery disease, PAD. Limitations        Today's exam was limited due to heavy calcification and the                     resulting shadowing and the patient's respiratory variation. Comparison Study:  Previous carotid duplex performed 11/27/20 showed RICA                    velocities of 767/34 cm/sec and LICA velocities of 193/79                    cm/sec. Left subclavian artery monophasic and retrograde left                    vertebral artery consistent with known subclavian steal                    syndrome. Performing Technologist: Mariane Masters RVT  Examination Guidelines: A complete evaluation includes B-mode imaging, spectral Doppler, color Doppler, and power Doppler as needed of all accessible portions of each vessel. Bilateral testing is considered an integral part of a complete examination. Limited examinations for reoccurring indications may be performed as noted.  Right Carotid Findings: +----------+--------+--------+--------+------------------+--------+             PSV cm/s EDV cm/s Stenosis Plaque Description Comments  +----------+--------+--------+--------+------------------+--------+  83.9 ml 44.74 ml/m  AORTIC VALVE AV Vmax:           134.00 cm/s AV Vmean:          93.700 cm/s AV VTI:            0.332 m AV Peak Grad:      7.2 mmHg AV Mean Grad:      4.0 mmHg LVOT Vmax:         91.90 cm/s LVOT Vmean:        55.900 cm/s LVOT VTI:          0.198 m LVOT/AV VTI ratio: 0.60  AORTA Ao Root diam: 4.20 cm Ao Asc diam:  3.40 cm MITRAL VALVE MV Area (PHT): 2.87 cm    SHUNTS MV Decel Time: 264 msec    Systemic VTI: 0.20 m MV E velocity: 80.80 cm/s MV A velocity: 71.60 cm/s MV E/A ratio:  1.13 Jenkins Rouge MD Electronically signed by Jenkins Rouge MD Signature Date/Time: 12/06/2021/11:27:54 AM    Final    VAS US CAROTID  Result Date: 11/27/2021 Carotid Arterial Duplex Study Patient Name:  Laurie Horton  Date of Exam:   11/27/2021 Medical Rec #: [id number]          Accession #:    [id number] Date of Birth: Mar 25, 1946           Patient Gender: F Patient Age:   76 years Exam Location:  [facility] Procedure:      VAS US CAROTID Referring Phys: Roderic Palau BERRY --------------------------------------------------------------------------------  Indications:       Carotid artery disease and left subclavian steal follow-up.                    Patient denies any cerebrovascular symptoms at this time.                    Medical management recommended for subclavian disease. Risk Factors:      Hypertension, hyperlipidemia, past history of smoking,                    coronary artery disease, PAD. Limitations        Today's exam was limited due to heavy calcification and the                     resulting shadowing and the patient's respiratory variation. Comparison Study:  Previous carotid duplex performed 11/27/20 showed RICA                    velocities of 767/34 cm/sec and LICA velocities of 193/79                    cm/sec. Left subclavian artery monophasic and retrograde left                    vertebral artery consistent with known subclavian steal                    syndrome. Performing Technologist: Mariane Masters RVT  Examination Guidelines: A complete evaluation includes B-mode imaging, spectral Doppler, color Doppler, and power Doppler as needed of all accessible portions of each vessel. Bilateral testing is considered an integral part of a complete examination. Limited examinations for reoccurring indications may be performed as noted.  Right Carotid Findings: +----------+--------+--------+--------+------------------+--------+             PSV cm/s EDV cm/s Stenosis Plaque Description Comments  +----------+--------+--------+--------+------------------+--------+

## 2021-12-06 NOTE — Assessment & Plan Note (Signed)
Continue Coumadin for history of A. fib

## 2021-12-08 ENCOUNTER — Ambulatory Visit (INDEPENDENT_AMBULATORY_CARE_PROVIDER_SITE_OTHER): Payer: Medicare Other | Admitting: Physician Assistant

## 2021-12-08 ENCOUNTER — Ambulatory Visit (INDEPENDENT_AMBULATORY_CARE_PROVIDER_SITE_OTHER): Payer: Medicare Other

## 2021-12-08 ENCOUNTER — Encounter (HOSPITAL_COMMUNITY): Payer: Self-pay

## 2021-12-08 ENCOUNTER — Other Ambulatory Visit: Payer: Self-pay

## 2021-12-08 ENCOUNTER — Ambulatory Visit (HOSPITAL_COMMUNITY)
Admission: RE | Admit: 2021-12-08 | Discharge: 2021-12-08 | Disposition: A | Discharge status: Home / Self Care | Payer: Medicare Other | Source: Ambulatory Visit | Attending: Physician Assistant | Admitting: Physician Assistant

## 2021-12-08 ENCOUNTER — Encounter: Payer: Self-pay | Admitting: Physician Assistant

## 2021-12-08 VITALS — BP 118/68 | HR 72 | Ht 64.0 in | Wt 178.8 lb

## 2021-12-08 DIAGNOSIS — E785 Hyperlipidemia, unspecified: Secondary | ICD-10-CM

## 2021-12-08 DIAGNOSIS — J449 Chronic obstructive pulmonary disease, unspecified: Secondary | ICD-10-CM | POA: Diagnosis not present

## 2021-12-08 DIAGNOSIS — I771 Stricture of artery: Secondary | ICD-10-CM | POA: Diagnosis not present

## 2021-12-08 DIAGNOSIS — I10 Essential (primary) hypertension: Secondary | ICD-10-CM | POA: Diagnosis not present

## 2021-12-08 DIAGNOSIS — Z8673 Personal history of transient ischemic attack (TIA), and cerebral infarction without residual deficits: Secondary | ICD-10-CM

## 2021-12-08 DIAGNOSIS — G510 Bell's palsy: Secondary | ICD-10-CM

## 2021-12-08 DIAGNOSIS — I480 Paroxysmal atrial fibrillation: Secondary | ICD-10-CM

## 2021-12-08 DIAGNOSIS — I739 Peripheral vascular disease, unspecified: Secondary | ICD-10-CM

## 2021-12-08 DIAGNOSIS — I5022 Chronic systolic (congestive) heart failure: Secondary | ICD-10-CM

## 2021-12-08 NOTE — Progress Notes (Unsigned)
Enrolled patient for a 14 day Zio XT monitor to be mailed to patients home   Dr Jordan to read 

## 2021-12-08 NOTE — Patient Instructions (Addendum)
Medication Instructions:  Your physician recommends that you continue on your current medications as directed. Please refer to the Current Medication list given to you today.   *If you need a refill on your cardiac medications before your next appointment, please call your pharmacy*   Lab Work: NONE ordered at this time of appointment   If you have labs (blood work) drawn today and your tests are completely normal, you will receive your results only by: [place] (if you have MyChart) OR A paper copy in the mail If you have any lab test that is abnormal or we need to change your treatment, we will call you to review the results.   Testing/Procedures:  Laurie Horton has ordered a CT scan of the HeadCT Scan A CT scan (computed tomography scan) is an imaging scan. It uses X-rays and a computer to make detailed pictures of different areas inside the body. A CT scan can give more information than a regular X-ray exam. A CT scan provides data about internal organs, soft tissue structures, blood vessels, and bones. In this procedure, the pictures will be taken in a large machine that has an opening (CT scanner). Tell a health care provider about: Any allergies you have. All medicines you are taking, including vitamins, herbs, eye drops, creams, and over-the-counter medicines. Any blood disorders you have. Any surgeries you have had. Any medical conditions you have. Whether you are pregnant or may be pregnant. What are the risks? Generally, this is a safe procedure. However, problems may occur, including: An allergic reaction to dye injected during the procedure. Development of cancer from excessive exposure to radiation from multiple CT scans. This is rare. What happens before the procedure? Staying hydrated Follow instructions from your health care provider about hydration, which may include: Up to 2 hours before the procedure - you may continue to drink clear liquids, such as water,  clear fruit juice, black coffee, and plain tea. Eating and drinking restrictions Follow instructions from your health care provider about eating and drinking, which may include: 24 hours before the procedure - stop drinking caffeinated beverages, such as energy drinks, tea, soda, coffee, and hot chocolate. 8 hours before the procedure - stop eating heavy meals or foods such as meat, fried foods, or fatty foods. 6 hours before the procedure - stop eating light meals or foods, such as toast or cereal. 6 hours before the procedure - stop drinking milk or drinks that contain milk. 2 hours before the procedure - stop drinking clear liquids. General instructions Remove any jewelry. Ask your health care provider about changing or stopping your regular medicines. This is especially important if you are taking diabetes medicines or blood thinners. What happens during the procedure? An IV tube may be inserted into one of your veins. The contrast dye may be injected into the IV tube. You may feel warm or have a metallic taste in your mouth. You will lie on a table with your arms above your head. The table you will be lying on will move into the CT scanner. You will be able to see, hear, and talk to the person running the machine while you are in it. Follow that person's instructions. The CT scanner will move around you to take pictures. Do not move while it is scanning. Staying still helps the scanner to get a good image. When the best possible pictures have been taken, the machine will be turned off. The table will be moved out of the  machine. The IV tube will be removed. The procedure may vary among health care providers and hospitals. What happens after the procedure? It is up to you to get the results of your procedure. Ask your health care provider, or the department that is doing the procedure, when your results will be ready. Summary A CT scan is an imaging scan. A CT scan uses X-rays and a  computer to make detailed pictures of different areas of your body. Follow instructions from your health care provider about eating and drinking before the procedure. You will be able to see, hear, and talk to the person running the machine while you are in it. Follow that person's instructions. This information is not intended to replace advice given to you by your health care provider. Make sure you discuss any questions you have with your health care provider. Document Revised: 07/02/2021 Document Reviewed: 09/19/2020 Elsevier Patient Education  2022 [place] term monitor-Live Telemetry  Your physician has requested you wear a ZIO patch monitor for 14 days.  This is a single patch monitor. [employer] supplies one patch monitor per enrollment. Additional  stickers are not available.  Please do not apply patch if you will be having a Nuclear Stress Test, Echocardiogram, Cardiac CT, MRI,  or Chest Xray during the period you would be wearing the monitor. The patch cannot be worn during  these tests. You cannot remove and re-apply the ZIO AT patch monitor.  Your ZIO patch monitor will be mailed 3 day USPS to your address on file. It may take 3-5 days to  receive your monitor after you have been enrolled.  Once you have received your monitor, please review the enclosed instructions. Your monitor has  already been registered assigning a specific monitor serial # to you.   Billing and Patient Assistance Program information  Laurie Horton has been supplied with any insurance information on record for billing. [employer] offers a sliding scale Patient Assistance Program for patients without insurance, or whose  insurance does not completely cover the cost of the ZIO patch monitor. You must apply for the  Patient Assistance Program to qualify for the discounted rate. To apply, call [employer] at [phone],  select option 4, select option 2 , ask to apply for the Patient Assistance Program,  (you can request an  interpreter if needed). [employer] will ask your household income and how many people are in your  household. [employer] will quote your out-of-pocket cost based on this information. They will also be able  to set up a 12 month interest free payment plan if needed.  Applying the monitor   Shave hair from upper left chest.  Hold the abrader disc by orange tab. Rub the abrader in 40 strokes over left upper chest as indicated in  your monitor instructions.  Clean area with 4 enclosed alcohol pads. Use all pads to ensure the area is cleaned thoroughly. Let  dry.  Apply patch as indicated in monitor instructions. Patch will be placed under collarbone on left side of  chest with arrow pointing upward.  Rub patch adhesive wings for 2 minutes. Remove the white label marked "1". Remove the white label  marked "2". Rub patch adhesive wings for 2 additional minutes.  While looking in a mirror, press and release button in center of patch. A small green light will flash 3-4  times. This will be your only indicator that the monitor has been turned on.  Do not shower for  the first 24 hours. You may shower after the first 24 hours.  Press the button if you feel a symptom. You will hear a small click. Record Date, Time and Symptom in  the Patient Log.   Starting the Gateway  In your kit there is a Hydrographic surveyor box the size of a cellphone. This is Airline pilot. It transmits all your  recorded data to [facility]. This box must always stay within 10 feet of you. Open the box and push the *  button. There will be a light that blinks orange and then green a few times. When the light stops  blinking, the Gateway is connected to the ZIO patch. Call [employer] at [phone] to confirm your monitor is transmitting.  Returning your monitor  Remove your patch and place it inside the [place]. In the lower half of the Gateway there is a white  bag with prepaid postage on it. Place Gateway in bag and  seal. Mail package back to [place] as soon as  possible. Your physician should have your final report approximately 7 days after you have mailed back  your monitor. Call [place] at [phone] if you have questions regarding your ZIO AT  patch monitor. Call them immediately if you see an orange light blinking on your monitor.  If your monitor falls off in less than 4 days, contact our Monitor department at [phone]. If your  monitor becomes loose or falls off after 4 days call [employer] at [phone] for suggestions on  securing your monitor   Follow-Up: At [facility], you and your health needs are our priority.  As part of our continuing mission to provide you with exceptional heart care, we have created designated Provider Care Teams.  These Care Teams include your primary Cardiologist (physician) and Advanced Practice Providers (APPs -  Physician Assistants and Nurse Practitioners) who all work together to provide you with the care you need, when you need it.  We recommend signing up for the patient portal called "MyChart".  Sign up information is provided on this After Visit Summary.  MyChart is used to connect with patients for Virtual Visits (Telemedicine).  Patients are able to view lab/test results, encounter notes, upcoming appointments, etc.  Non-urgent messages can be sent to your provider as well.   To learn more about what you can do with MyChart, go to [email or web address].    Your next appointment:   5 week(s)  The format for your next appointment:   In Person  Provider:   Almyra Deforest PA-C  If primary card or EP is not listed click here to update    :1}    Other Instructions ZIO XT- Long Term Monitor Instructions  Your physician has requested you wear a ZIO patch monitor for 14 days.  This is a single patch monitor. [employer] supplies one patch monitor per enrollment. Additional stickers are not available. Please do not apply patch  if you will be having a Nuclear Stress Test,  Echocardiogram, Cardiac CT, MRI, or Chest Xray during the period you would be wearing the  monitor. The patch cannot be worn during these tests. You cannot remove and re-apply the  ZIO XT patch monitor.  Your ZIO patch monitor will be mailed 3 day USPS to your address on file. It may take 3-5 days  to receive your monitor after you have been enrolled.  Once you have received your monitor, please review the enclosed instructions. Your monitor  has already been  registered assigning a specific monitor serial # to you.  Billing and Patient Assistance Program Information  We have supplied [employer] with any of your insurance information on file for billing purposes. [employer] offers a sliding scale Patient Assistance Program for patients that do not have  insurance, or whose insurance does not completely cover the cost of the ZIO monitor.  You must apply for the Patient Assistance Program to qualify for this discounted rate.  To apply, please call [employer] at [phone], select option 4, select option 2, ask to apply for  Patient Assistance Program. Laurie Horton will ask your household income, and how many people  are in your household. They will quote your out-of-pocket cost based on that information.  [employer] will also be able to set up a 39-month interest-free payment plan if needed.  Applying the monitor   Shave hair from upper left chest.  Hold abrader disc by orange tab. Rub abrader in 40 strokes over the upper left chest as  indicated in your monitor instructions.  Clean area with 4 enclosed alcohol pads. Let dry.  Apply patch as indicated in monitor instructions. Patch will be placed under collarbone on left  side of chest with arrow pointing upward.  Rub patch adhesive wings for 2 minutes. Remove white label marked "1". Remove the white  label marked "2". Rub patch adhesive wings for 2 additional minutes.  While looking in a mirror, press and  release button in center of patch. A small green light will  flash 3-4 times. This will be your only indicator that the monitor has been turned on.  Do not shower for the first 24 hours. You may shower after the first 24 hours.  Press the button if you feel a symptom. You will hear a small click. Record Date, Time and  Symptom in the Patient Logbook.  When you are ready to remove the patch, follow instructions on the last 2 pages of Patient  Logbook. Stick patch monitor onto the last page of Patient Logbook.  Place Patient Logbook in the blue and white box. Use locking tab on box and tape box closed  securely. The blue and white box has prepaid postage on it. Please place it in the mailbox as  soon as possible. Your physician should have your test results approximately 7 days after the  [facility] has [facility]  Call [place]at [phone] you have questions regarding  your ZIO XT patch monitor. Call them immediately if you see an orange light blinking on your  monitor.  If your monitor falls off in less than 4 days, contact our Monitor department at [phone]  If your monitor becomes loose or falls off after 4 days call [employer] at [phone]  suggestions on securing your monitor

## 2021-12-08 NOTE — Progress Notes (Addendum)
Cardiology Office Note:    Date:  12/08/2021   ID:  Laurie Horton, DOB Dec 31, 1945, MRN [id number]  PCP:  Georgianne Fick, MD   [facility] Providers Cardiologist:  Peter Swaziland, MD     Referring MD: Georgianne Fick, MD   Chief Complaint  Patient presents with   Shortness of Breath    Left shoulder pain also   Follow-up    Recent ED visit for A-fib.   Facial Droop    History of Present Illness:    Laurie Horton is a 76 y.o. female with a hx of COPD on 2 L nasal cannula at baseline, HTN, HLD, prior CVA, PAD, PFO, PAF on Coumadin and history of chronic systolic heart failure with improved EF. She was admitted to the hospital in April 2018 with worsening dyspnea on exertion and palpitation and was noted to be in atrial fibrillation with RVR. Echocardiogram at the time showed EF 20 to 25%. Patient underwent IV diuresis. Subsequent left and right heart cath performed on 03/31/2017 demonstrated severe two-vessel CAD with 100% RCA occlusion, 75% OM1, and a 90% small OM 2, EF 25 to 30%. There was mild pulmonary hypertension with normal LV filling pressure.  It was felt her cardiomyopathy was ischemic.  Medical therapy was recommended. Repeat echocardiogram in September 2018 demonstrated EF improved to 50 to 55%.  Due to claudication symptoms, she was referred to Dr. Allyson Sabal and underwent lower extremity angiogram that showed 80% infrarenal aortic stenosis and left iliac stenosis. She had severe right common femoral artery stenosis. Patient was seen by Dr. Myra Gianotti for consideration of aortobifemoral bypass, however after pulmonary evaluation, she was felt to be too high of risk for open surgery. She eventually underwent atherectomy and covered stenting of the left iliac by Dr. Allyson Sabal in March 2019. Her ABI and claudication symptoms did improve. The aortic and the right common femoral artery stenosis was felt not amenable to percutaneous therapy.  She also has history of high-grade left  subclavian artery stenosis, however it was not clear whether this was symptomatic or not.  Given her other medical problems, this was managed medically.  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 INR was 1.9 at the time. Plavix was added to her medical regimen.  MRI showed no acute infarct however she did have evidence of multiple old stroke. She sees Dr. Marchelle Gearing for her COPD. Patient was last seen in December 2022 to which time she was doing well from the cardiac perspective. Carotid ultrasound obtained on 11/27/2021 demonstrated 1 to 39% right ICA stenosis, 40 to 59% left ICA stenosis, disturbed right subclavian artery flow, monophasic left subclavian artery flow, there was 60 mmHg pressure gradient between the arms with the left being the lowest.  Right brachial artery demonstrated triphasic flow, left brachial artery demonstrated monophasic flow.  More recently, patient presented to the hospital on 12/06/2021 with palpitations, chest pain, she was found to be in A-fib with RVR.  Serial troponin went from 9 --> 24 --> 36.  While in the ED, patient converted to sinus rhythm spontaneously. She was seen by the cardiology fellow who felt that she was not in CHF. Echocardiogram obtained on the same day showed EF 55 to 60%, no regional wall motion abnormality, mild LVH, mild LAE. She was discharged to follow-up with cardiology service as outpatient.  Since her ED visit, she has been having persistent fatigue and weakness.  While her daughter  picked her up from the hospital yesterday afternoon around 4 PM, her daughter noticed facial droop that was not there before.  On exam today, she has left lower extremity weakness that appears to be chronic since the previous stroke.  Her grip on the left side is mildly weaker than the right side.  She does appears to have a very mild facial droop as well.  She mentioned that her heart rate has been elevated when she tried  to get up.  I am not sure if her heart rate is related to dehydration versus recurrence of A-fib.  She does mention she has managed to lose 4 pounds overnight.  I asked her to increase hydration.  We will also obtain a 2-week heart monitor as well.  I discussed the case with Dr. Servando Salina, with her new onset of facial droop, left concerned especially given her history of stroke.  Fortunately, her INR seems to be therapeutic at 2.0 on 12/06/2021.  We talked to her about sending her to the emergency room versus getting an outpatient stat CT of the head.  She adamantly refused to go to the emergency room.  She says she is currently feeling well, there is no family member who can drive her to to the [facility] to get outpatient CT image.  Since she has been driving herself without any issue, we will arrange for stat CT for this afternoon.   Past Medical History:  Diagnosis Date   Acute diastolic heart failure, NYHA class 1 (HCC) 02/08/2017   Acute heart failure (HCC) 02/08/2017   Cataract    OD   CHF (congestive heart failure) (HCC) 03/02/2017   EF 25-30% 2018   Complication of anesthesia    various issues with oxygen  saturations post op   COPD (chronic obstructive pulmonary disease) (HCC)    Depression    Depression    Phreesia 11/04/2020   Diverticulitis    DVT (deep venous thrombosis) (HCC)    Fibromyalgia    GERD (gastroesophageal reflux disease)    Heart murmur    Phreesia 11/04/2020   Hiatal hernia    History of deviated nasal septum    left- side   HTN (hypertension)    Hyperlipidemia    Hypertension    Phreesia 11/04/2020   Hypertensive retinopathy    OU   Myocardial infarction (HCC)    Phreesia 11/04/2020   Obesity    On supplemental oxygen therapy    concentrator at night @ 1.5 l/m or when sleeps. O2 Sat niormally 87.   Osteoporosis    Osteoporosis    Phreesia 11/04/2020   Oxygen deficiency    Phreesia 11/04/2020   PAT (paroxysmal atrial tachycardia) (HCC)    PFO  (patent foramen ovale)    PULMONARY NODULE, LEFT LOWER LOBE 10/14/2009   5mm LLL nodule dec 2010. Stable and 4mm in Oct 2012. 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 Persists on cxr 07/22/11. Cleared on CT 08/24/11. No further followup   Stroke [facility])    TOBACCO ABUSE 06/04/2009    Past Surgical History:  Procedure Laterality Date   ABDOMINAL HYSTERECTOMY N/A    Phreesia 11/04/2020   CARDIOVASCULAR STRESS TEST  12/26/2004   EF 74%. NO EVIDENCE OF ISCHEMIA   CATARACT EXTRACTION Left    Dr. Hortense Ramal   ESOPHAGOGASTRODUODENOSCOPY (EGD) WITH PROPOFOL N/A 04/15/2015   Procedure:  ESOPHAGOGASTRODUODENOSCOPY (EGD) WITH PROPOFOL;  Surgeon: Carman Ching, MD;  Location: [facility] ENDOSCOPY;  Service: Endoscopy;  Laterality: N/A;   EYE SURGERY Left    Cat Sx   JOINT REPLACEMENT N/A    Phreesia 11/04/2020   KNEE ARTHROSCOPY  2000   left   LAPAROSCOPIC CHOLECYSTECTOMY  04-16-2010   [facility]   LOWER EXTREMITY ANGIOGRAPHY N/A 09/09/2017   Procedure: Lower Extremity Angiography;  Surgeon: Runell Gess, MD;  Location: [facility] INVASIVE CV LAB;  Service: Cardiovascular;  Laterality: N/A;   LOWER EXTREMITY INTERVENTION Left 01/17/2018   Procedure: LOWER EXTREMITY INTERVENTION;  Surgeon: Runell Gess, MD;  Location: [facility] INVASIVE CV LAB;  Service: Cardiovascular;  Laterality: Left;   MOUTH SURGERY     03-26-15 multiple extractions stitches remains   PERIPHERAL VASCULAR INTERVENTION Left 01/17/2018   Procedure: PERIPHERAL VASCULAR INTERVENTION;  Surgeon: Runell Gess, MD;  Location: [facility] INVASIVE CV LAB;  Service: Cardiovascular;  Laterality: Left;  COMMON ILIAC   RIGHT/LEFT HEART CATH AND CORONARY ANGIOGRAPHY N/A 03/04/2017   Procedure: Right/Left Heart Cath and Coronary Angiography;  Surgeon: Peter M Swaziland, MD;  Location: [facility] INVASIVE CV LAB;  Service: Cardiovascular;  Laterality: N/A;   TOTAL ABDOMINAL  HYSTERECTOMY     post op needed oxygen was told "she gave them a scare"   TUBAL LIGATION     US ECHOCARDIOGRAPHY  11/20/2009   EF 55-60%    Current Medications: Current Meds  Medication Sig   carvedilol (COREG) 12.5 MG tablet TAKE 1 TABLET BY MOUTH TWICE A DAY (Patient taking differently: Take 12.5 mg by mouth daily.)   clopidogrel (PLAVIX) 75 MG tablet TAKE 1 TABLET BY MOUTH EVERY DAY (Patient taking differently: Take 75 mg by mouth daily.)   desvenlafaxine (PRISTIQ) 50 MG 24 hr tablet Take 1 tablet (50 mg total) by mouth daily.   diclofenac sodium (VOLTAREN) 1 % GEL Apply 2 g topically 4 (four) times daily.   Eyelid Cleansers (OCUSOFT BABY EYELID & EYELASH EX) Apply topically.   furosemide (LASIX) 40 MG tablet TAKE 1 TABLET BY MOUTH EVERY DAY (Patient taking differently: Take 40 mg by mouth daily.)   gabapentin (NEURONTIN) 100 MG capsule Take 200 mg by mouth See admin instructions. Take 1 in am and 1 in the lunch and 2 at bedtime   HYDROcodone-acetaminophen (NORCO/VICODIN) 5-325 MG tablet Take 1 tablet by mouth 3 (three) times daily.   lidocaine (LIDODERM) 5 % Place 1-3 patches onto the skin as directed.   montelukast (SINGULAIR) 10 MG tablet TAKE 1 TABLET BY MOUTH EVERYDAY AT BEDTIME (Patient taking differently: Take 10 mg by mouth daily.)   Multiple Vitamins-Minerals (VITAMIN D3 COMPLETE PO) Take by mouth.   nitroGLYCERIN (NITROSTAT) 0.4 MG SL tablet PLACE 1 TABLET UNDER THE TONGUE EVERY 5 MINUTES AS NEEDED FOR CHEST PAIN (Patient taking differently: Place 0.4 mg under the tongue every 5 (five) minutes as needed for chest pain.)   Omega-3 Fatty Acids (FISH OIL) 1000 MG CAPS Take 1,000 mg by mouth daily.   ondansetron (ZOFRAN-ODT) 8 MG disintegrating tablet Take 1 tablet (8 mg total) by mouth every 8 (eight) hours as needed for nausea or vomiting.   oxybutynin (DITROPAN-XL) 10 MG 24 hr tablet Take 1 tablet (10 mg total) by mouth daily.   OXYGEN Inhale 1.5-3 L into the lungs as needed (for  shortness of breath).   pantoprazole (PROTONIX) 40 MG tablet TAKE 1 TABLET BY MOUTH EVERY DAY (Patient taking differently: Take 40 mg by mouth daily.)  polyethylene glycol (MIRALAX / GLYCOLAX) packet Take 17 g by mouth daily as needed for mild constipation.    PRALUENT 75 MG/ML SOAJ INJECT 75 MG INTO THE SKIN EVERY 14 (FOURTEEN) DAYS.   spironolactone (ALDACTONE) 25 MG tablet TAKE 1 TABLET BY MOUTH EVERY DAY (Patient taking differently: Take 25 mg by mouth daily.)   traZODone (DESYREL) 100 MG tablet Take one tab daily as needed for sleep (Patient taking differently: Take 100 mg by mouth at bedtime.)   triamcinolone cream (KENALOG) 0.1 % Apply 1 application topically as needed (Yeast Infection).   warfarin (COUMADIN) 5 MG tablet TAKE 1/2 TO 1 TABLET BY MOUTH EVERY DAY OR AS DIRECTED BY COUMADIN CLINIC (Patient taking differently: Take 5 mg by mouth as directed. Take 1/2 (2.5 mg) tablet everyday except on Wednesday. On Wednesday take 1 (5 mg) tablet as directed by Coumadin Clinic)     Allergies:   Alprazolam, Bee venom, Iodine, Pseudoephedrine hcl er, Budesonide-formoterol fumarate, Crestor [rosuvastatin calcium], Esomeprazole magnesium, Flonase [fluticasone propionate], Lamictal [lamotrigine], Loratadine, Lotrimin [clotrimazole], Lunesta [eszopiclone], Oxcarbazepine, Statins, Zolpidem tartrate, Betadine [povidone iodine], Bevespi aerosphere [glycopyrrolate-formoterol], Breztri aerosphere [budeson-glycopyrrol-formoterol], Clarithromycin, Effexor [venlafaxine], Lexapro [escitalopram oxalate], Aciphex [rabeprazole sodium], Alendronate sodium, Avelox [moxifloxacin hcl in nacl], Bextra [valdecoxib], Ceclor [cefaclor], Cephalexin, Covera-hs [verapamil hcl], Dicyclomine hcl, Other, and Tessalon perles   Social History   Socioeconomic History   Marital status: Divorced    Spouse name: Not on file   Number of children: 2   Years of education: Not on file   Highest education level: Not on file   Occupational History   Occupation: disability  Tobacco Use   Smoking status: Former    Packs/day: 3.00    Years: 30.00    Pack years: 90.00    Types: Cigarettes    Quit date: 06/02/2010    Years since quitting: 11.5   Smokeless tobacco: Never   Tobacco comments:    QUIT IN 2011  Vaping Use   Vaping Use: Never used  Substance and Sexual Activity   Alcohol use: No    Alcohol/week: 0.0 standard drinks   Drug use: No   Sexual activity: Yes    Partners: Male    Birth control/protection: Post-menopausal, Surgical  Other Topics Concern   Not on file  Social History Narrative   Marital status: divorced in 1978 after ten years; dating casually in 2019.      Children: 3 biological children; 3 court appointed children; 6 grandchildren; 5 gg      Lives: alone; children in [place] and [place].      Employment: retired age 37; disability for COPD, CVA at age 28.      Tobacco: former smoker; quit smoking 2011.      Alcohol: none      Exercise: walks dog four times per day; goes to pool three times per week.      ADLs: drives; no assistant devices; does have a walker.  Cleaning is limited in 2018.  Daughter helps with cleaning.  Does own grocery shopping.      Advanced Directives: YES; DNR/DNI; HCPOA: [name] Martin/daughter youngest.  Blind.               Social Determinants of Health   Financial Resource Strain: Not on file  Food Insecurity: Not on file  Transportation Needs: Not on file  Physical Activity: Not on file  Stress: Not on file  Social Connections: Not on file     Family History: The patient's family history  includes Alzheimer's disease in her mother; Dementia in her mother; Diabetes in her mother and sister; Heart attack in her father; Heart attack (age of onset: 22) in her brother; Schizophrenia in her sister; Tremor in her sister.  ROS:   Please see the history of present illness.     All other systems reviewed and are negative.  EKGs/Labs/Other  Studies Reviewed:    The following studies were reviewed today:  Echo 12/06/2021  1. Left ventricular ejection fraction, by estimation, is 55 to 60%. The  left ventricle has normal function. The left ventricle has no regional  wall motion abnormalities. There is mild left ventricular hypertrophy.  Left ventricular diastolic parameters  were normal.   2. Right ventricular systolic function is normal. The right ventricular  size is normal.   3. Left atrial size was mildly dilated.   4. The mitral valve is normal in structure. No evidence of mitral valve  regurgitation. No evidence of mitral stenosis.   5. The aortic valve is tricuspid. There is mild calcification of the  aortic valve. There is mild thickening of the aortic valve. Aortic valve  regurgitation is not visualized. Aortic valve sclerosis is present, with  no evidence of aortic valve stenosis.   6. The inferior vena cava is normal in size with greater than 50%  respiratory variability, suggesting right atrial pressure of 3 mmHg.  EKG:  EKG is ordered today.  The ekg ordered today demonstrates normal sinus rhythm, chronic T wave inversion in the anterior leads  Recent Labs: 07/16/2021: NT-Pro BNP 363 12/05/2021: ALT 12; B Natriuretic Peptide 63.0; BUN 18; Creatinine, Ser 1.09; Hemoglobin 13.2; Platelets 180; Potassium 3.7; Sodium 135  Recent Lipid Panel    Component Value Date/Time   CHOL 132 07/02/2021 1017   CHOL 115 09/17/2020 0946   TRIG 220.0 (H) 07/02/2021 1017   HDL 41.80 07/02/2021 1017   HDL 36 (L) 09/17/2020 0946   CHOLHDL 3 07/02/2021 1017   VLDL 44.0 (H) 07/02/2021 1017   LDLCALC 52 09/17/2020 0946   LDLDIRECT 67.0 07/02/2021 1017     Risk Assessment/Calculations:    CHA2DS2-VASc Score = 8   This indicates a 10.8% annual risk of stroke. The patient's score is based upon: CHF History: 1 HTN History: 1 Diabetes History: 0 Stroke History: 2 Vascular Disease History: 1 Age Score: 2 Gender Score: 1       Physical Exam:    VS:  BP 118/68    Pulse 72    Ht 5\' 4"  (1.626 m)    Wt 178 lb 12.8 oz (81.1 kg)    SpO2 94%    BMI 30.69 kg/m     Wt Readings from Last 3 Encounters:  12/08/21 178 lb 12.8 oz (81.1 kg)  12/05/21 181 lb (82.1 kg)  11/13/21 179 lb 6.4 oz (81.4 kg)     GEN: Well nourished, well developed in no acute distress HEENT: Normal NECK: No JVD; No carotid bruits LYMPHATICS: No lymphadenopathy CARDIAC: RRR, no murmurs, rubs, gallops RESPIRATORY:  Clear to auscultation without rales, wheezing or rhonchi  ABDOMEN: Soft, non-tender, non-distended MUSCULOSKELETAL:  No edema; No deformity  SKIN: Warm and dry NEUROLOGIC:  Alert and oriented x 3 PSYCHIATRIC:  Normal affect   ASSESSMENT:    1. PAF (paroxysmal atrial fibrillation) (HCC)   2. Subclavian artery stenosis, left (HCC)   3. Chronic obstructive pulmonary disease, unspecified COPD type (HCC)   4. Essential hypertension   5. Hyperlipidemia LDL goal <70   6.  H/O: CVA (cerebrovascular accident)   7. PAD (peripheral artery disease) (HCC)   8. Chronic systolic CHF (congestive heart failure) (HCC)   9. Bell's palsy    PLAN:    In order of problems listed above:  Facial droop  -Patient had a history of stroke.  However family member started noticing a facial droop since 4 PM yesterday.  She has chronic left lower extremity weakness.  Her grip strength on the left side seems to be mildly weaker than the right side.  Otherwise, she does not seem to have other neurological deficit.  -Discussed with MD and patient, patient adamantly refused to go to the emergency room, we will have her sign AMA.  We plan to obtain a stat outpatient CT of the head at this afternoon at [facility].  Weakness: Patient did mention she lost 4 pounds overnight.  I question some of her weakness is related to dehydration.  I asked her to keep herself adequately hydrated.  There is no sign of AKI on the recent lab work.  There is no  significant anemia either.  She also mentions she has episodes of fast heart rate upon standing.  Paroxysmal atrial fibrillation: Obtain 2-week heart monitor to look for A-fib burden.  Continue Coumadin  Left subclavian artery stenosis: No obvious symptoms  COPD: On 2 L nasal cannula at baseline  Hypertension: Blood pressure stable  Hyperlipidemia: Intolerant of statins  History of CVA: History of stroke and also retinal artery occlusion.  Was started on Plavix therapy  PAD: No significant claudication symptoms today.  Chronic systolic heart failure: With improved EF.  EF normalized on the last echocardiogram.  She appears to be euvolemic today, with elevation of the heart rate upon standing, I am actually worried about mild dehydration.       Medication Adjustments/Labs and Tests Ordered: Current medicines are reviewed at length with the patient today.  Concerns regarding medicines are outlined above.  Orders Placed This Encounter  Procedures   CT HEAD WO CONTRAST ( )   LONG TERM MONITOR (3-14 DAYS)   EKG 12-Lead   No orders of the defined types were placed in this encounter.   Patient Instructions  Medication Instructions:  Your physician recommends that you continue on your current medications as directed. Please refer to the Current Medication list given to you today.   *If you need a refill on your cardiac medications before your next appointment, please call your pharmacy*   Lab Work: NONE ordered at this time of appointment   If you have labs (blood work) drawn today and your tests are completely normal, you will receive your results only by: MyChart Message (if you have MyChart) OR A paper copy in the mail If you have any lab test that is abnormal or we need to change your treatment, we will call you to review the results.   Testing/Procedures:  Terisa Belardo Mng- PAC has ordered a CT scan of the HeadCT Scan A CT scan (computed tomography scan) is an imaging scan.  It uses X-rays and a computer to make detailed pictures of different areas inside the body. A CT scan can give more information than a regular X-ray exam. A CT scan provides data about internal organs, soft tissue structures, blood vessels, and bones. In this procedure, the pictures will be taken in a large machine that has an opening (CT scanner). Tell a health care provider about: Any allergies you have. All medicines you are taking, including vitamins, herbs,  eye drops, creams, and over-the-counter medicines. Any blood disorders you have. Any surgeries you have had. Any medical conditions you have. Whether you are pregnant or may be pregnant. What are the risks? Generally, this is a safe procedure. However, problems may occur, including: An allergic reaction to dye injected during the procedure. Development of cancer from excessive exposure to radiation from multiple CT scans. This is rare. What happens before the procedure? Staying hydrated Follow instructions from your health care provider about hydration, which may include: Up to 2 hours before the procedure - you may continue to drink clear liquids, such as water, clear fruit juice, black coffee, and plain tea. Eating and drinking restrictions Follow instructions from your health care provider about eating and drinking, which may include: 24 hours before the procedure - stop drinking caffeinated beverages, such as energy drinks, tea, soda, coffee, and hot chocolate. 8 hours before the procedure - stop eating heavy meals or foods such as meat, fried foods, or fatty foods. 6 hours before the procedure - stop eating light meals or foods, such as toast or cereal. 6 hours before the procedure - stop drinking milk or drinks that contain milk. 2 hours before the procedure - stop drinking clear liquids. General instructions Remove any jewelry. Ask your health care provider about changing or stopping your regular medicines. This is especially  important if you are taking diabetes medicines or blood thinners. What happens during the procedure? An IV tube may be inserted into one of your veins. The contrast dye may be injected into the IV tube. You may feel warm or have a metallic taste in your mouth. You will lie on a table with your arms above your head. The table you will be lying on will move into the CT scanner. You will be able to see, hear, and talk to the person running the machine while you are in it. Follow that person's instructions. The CT scanner will move around you to take pictures. Do not move while it is scanning. Staying still helps the scanner to get a good image. When the best possible pictures have been taken, the machine will be turned off. The table will be moved out of the machine. The IV tube will be removed. The procedure may vary among health care providers and hospitals. What happens after the procedure? It is up to you to get the results of your procedure. Ask your health care provider, or the department that is doing the procedure, when your results will be ready. Summary A CT scan is an imaging scan. A CT scan uses X-rays and a computer to make detailed pictures of different areas of your body. Follow instructions from your health care provider about eating and drinking before the procedure. You will be able to see, hear, and talk to the person running the machine while you are in it. Follow that person's instructions. This information is not intended to replace advice given to you by your health care provider. Make sure you discuss any questions you have with your health care provider. Document Revised: 07/02/2021 Document Reviewed: 09/19/2020 Elsevier Patient Education  2022 Elsevier Inc.   Laurie Horton AT Long term monitor-Live Telemetry  Your physician has requested you wear a ZIO patch monitor for 14 days.  This is a single patch monitor. [employer] supplies one patch monitor per enrollment. Additional   stickers are not available.  Please do not apply patch if you will be having a Nuclear Stress Test, Echocardiogram, Cardiac CT, MRI,  or Chest Xray during the period you would be wearing the monitor. The patch cannot be worn during  these tests. You cannot remove and re-apply the ZIO AT patch monitor.  Your ZIO patch monitor will be mailed 3 day USPS to your address on file. It may take 3-5 days to  receive your monitor after you have been enrolled.  Once you have received your monitor, please review the enclosed instructions. Your monitor has  already been registered assigning a specific monitor serial # to you.   Billing and Patient Assistance Program information  Laurie Horton has been supplied with any insurance information on record for billing. [employer] offers a sliding scale Patient Assistance Program for patients without insurance, or whose  insurance does not completely cover the cost of the ZIO patch monitor. You must apply for the  Patient Assistance Program to qualify for the discounted rate. To apply, call [employer] at [phone],  select option 4, select option 2 , ask to apply for the Patient Assistance Program, (you can request an  interpreter if needed). [employer] will ask your household income and how many people are in your  household. [employer] will quote your out-of-pocket cost based on this information. They will also be able  to set up a 12 month interest free payment plan if needed.  Applying the monitor   Shave hair from upper left chest.  Hold the abrader disc by orange tab. Rub the abrader in 40 strokes over left upper chest as indicated in  your monitor instructions.  Clean area with 4 enclosed alcohol pads. Use all pads to ensure the area is cleaned thoroughly. Let  dry.  Apply patch as indicated in monitor instructions. Patch will be placed under collarbone on left side of  chest with arrow pointing upward.  Rub patch adhesive wings for 2 minutes. Remove the white  label marked "1". Remove the white label  marked "2". Rub patch adhesive wings for 2 additional minutes.  While looking in a mirror, press and release button in center of patch. A small green light will flash 3-4  times. This will be your only indicator that the monitor has been turned on.  Do not shower for the first 24 hours. You may shower after the first 24 hours.  Press the button if you feel a symptom. You will hear a small click. Record Date, Time and Symptom in  the Patient Log.   Starting the Gateway  In your kit there is a Audiological scientist box the size of a cellphone. This is Buyer, retail. It transmits all your  recorded data to [facility]. This box must always stay within 10 feet of you. Open the box and push the *  button. There will be a light that blinks orange and then green a few times. When the light stops  blinking, the Gateway is connected to the ZIO patch. Call [employer] at [phone] to confirm your monitor is transmitting.  Returning your monitor  Remove your patch and place it inside the Gateway. In the lower half of the Gateway there is a white  bag with prepaid postage on it. Place Gateway in bag and seal. Mail package back to [place] as soon as  possible. Your physician should have your final report approximately 7 days after you have mailed back  your monitor. Call [facility] Customer Care at [phone] if you have questions regarding your ZIO AT  patch monitor. Call them immediately if you see an orange light blinking on  your monitor.  If your monitor falls off in less than 4 days, contact our Monitor department at [phone]. If your  monitor becomes loose or falls off after 4 days call [employer] at [phone] for suggestions on  securing your monitor   Follow-Up: At [facility], you and your health needs are our priority.  As part of our continuing mission to provide you with exceptional heart care, we have created designated Provider Care  Teams.  These Care Teams include your primary Cardiologist (physician) and Advanced Practice Providers (APPs -  Physician Assistants and Nurse Practitioners) who all work together to provide you with the care you need, when you need it.  We recommend signing up for the patient portal called "MyChart".  Sign up information is provided on this After Visit Summary.  MyChart is used to connect with patients for Virtual Visits (Telemedicine).  Patients are able to view lab/test results, encounter notes, upcoming appointments, etc.  Non-urgent messages can be sent to your provider as well.   To learn more about what you can do with MyChart, go to [email or web address].    Your next appointment:   5 week(s)  The format for your next appointment:   In Person  Provider:   Azalee Course PA-C  If primary card or EP is not listed click here to update    :1}    Other Instructions ZIO XT- Long Term Monitor Instructions  Your physician has requested you wear a ZIO patch monitor for 14 days.  This is a single patch monitor. [employer] supplies one patch monitor per enrollment. Additional stickers are not available. Please do not apply patch if you will be having a Nuclear Stress Test,  Echocardiogram, Cardiac CT, MRI, or Chest Xray during the period you would be wearing the  monitor. The patch cannot be worn during these tests. You cannot remove and re-apply the  ZIO XT patch monitor.  Your ZIO patch monitor will be mailed 3 day USPS to your address on file. It may take 3-5 days  to receive your monitor after you have been enrolled.  Once you have received your monitor, please review the enclosed instructions. Your monitor  has already been registered assigning a specific monitor serial # to you.  Billing and Patient Assistance Program Information  We have supplied [employer] with any of your insurance information on file for billing purposes. [employer] offers a sliding scale Patient Assistance Program for  patients that do not have  insurance, or whose insurance does not completely cover the cost of the ZIO monitor.  You must apply for the Patient Assistance Program to qualify for this discounted rate.  To apply, please call [employer] at [phone], select option 4, select option 2, ask to apply for  Patient Assistance Program. Laurie Horton will ask your household income, and how many people  are in your household. They will quote your out-of-pocket cost based on that information.  [employer] will also be able to set up a 50-month, interest-free payment plan if needed.  Applying the monitor   Shave hair from upper left chest.  Hold abrader disc by orange tab. Rub abrader in 40 strokes over the upper left chest as  indicated in your monitor instructions.  Clean area with 4 enclosed alcohol pads. Let dry.  Apply patch as indicated in monitor instructions. Patch will be placed under collarbone on left  side of chest with arrow pointing upward.  Rub patch adhesive wings for 2 minutes. Remove white label marked "1".  Remove the white  label marked "2". Rub patch adhesive wings for 2 additional minutes.  While looking in a mirror, press and release button in center of patch. A small green light will  flash 3-4 times. This will be your only indicator that the monitor has been turned on.  Do not shower for the first 24 hours. You may shower after the first 24 hours.  Press the button if you feel a symptom. You will hear a small click. Record Date, Time and  Symptom in the Patient Logbook.  When you are ready to remove the patch, follow instructions on the last 2 pages of Patient  Logbook. Stick patch monitor onto the last page of Patient Logbook.  Place Patient Logbook in the blue and white box. Use locking tab on box and tape box closed  securely. The blue and white box has prepaid postage on it. Please place it in the mailbox as  soon as possible. Your physician should have your test results approximately  7 days after the  monitor has been mailed back to [facility].  Call [facility] Customer Care at [phone] if you have questions regarding  your ZIO XT patch monitor. Call them immediately if you see an orange light blinking on your  monitor.  If your monitor falls off in less than 4 days, contact our Monitor department at [phone].  If your monitor becomes loose or falls off after 4 days call [employer] at [phone] for  suggestions on securing your monitor     Signed, Azalee Course, [place]  12/08/2021 2:41 PM    [facility]

## 2021-12-11 ENCOUNTER — Encounter: Payer: Self-pay | Admitting: Internal Medicine

## 2021-12-12 DIAGNOSIS — I480 Paroxysmal atrial fibrillation: Secondary | ICD-10-CM

## 2021-12-12 NOTE — Telephone Encounter (Signed)
[name] - thanks for letting us know. As you can imagine is hard balance between meds and side effect but appreciate you informing us .Best wishes

## 2021-12-12 NOTE — Telephone Encounter (Signed)
This is an FYI to provider. No response needed.

## 2022-01-06 ENCOUNTER — Other Ambulatory Visit: Payer: Self-pay | Admitting: Internal Medicine

## 2022-01-06 DIAGNOSIS — J9611 Chronic respiratory failure with hypoxia: Secondary | ICD-10-CM

## 2022-01-08 ENCOUNTER — Ambulatory Visit (INDEPENDENT_AMBULATORY_CARE_PROVIDER_SITE_OTHER): Payer: Medicare Other

## 2022-01-08 ENCOUNTER — Other Ambulatory Visit: Payer: Self-pay

## 2022-01-08 DIAGNOSIS — Z7901 Long term (current) use of anticoagulants: Secondary | ICD-10-CM

## 2022-01-08 DIAGNOSIS — I480 Paroxysmal atrial fibrillation: Secondary | ICD-10-CM | POA: Diagnosis not present

## 2022-01-08 LAB — POCT INR: INR: 1.9 — AB (ref 2.0–3.0)

## 2022-01-08 NOTE — Patient Instructions (Signed)
TAKE 1 TABLET TONIGHT ONLY and then Continue taking 1/2 a tablet daily, except 1 tablet on Wednesday. Recheck INR in 2 weeks. Coumadin Clinic [phone].   ?

## 2022-01-10 ENCOUNTER — Other Ambulatory Visit: Payer: Self-pay | Admitting: Internal Medicine

## 2022-01-10 DIAGNOSIS — J9611 Chronic respiratory failure with hypoxia: Secondary | ICD-10-CM

## 2022-01-13 ENCOUNTER — Other Ambulatory Visit: Payer: Self-pay | Admitting: Internal Medicine

## 2022-01-13 DIAGNOSIS — J9611 Chronic respiratory failure with hypoxia: Secondary | ICD-10-CM

## 2022-01-19 ENCOUNTER — Ambulatory Visit (INDEPENDENT_AMBULATORY_CARE_PROVIDER_SITE_OTHER): Payer: Medicare Other

## 2022-01-19 ENCOUNTER — Ambulatory Visit (INDEPENDENT_AMBULATORY_CARE_PROVIDER_SITE_OTHER): Payer: Medicare Other | Admitting: Physician Assistant

## 2022-01-19 ENCOUNTER — Other Ambulatory Visit: Payer: Self-pay

## 2022-01-19 ENCOUNTER — Encounter: Payer: Self-pay | Admitting: Physician Assistant

## 2022-01-19 VITALS — BP 124/72 | HR 67 | Ht 64.0 in | Wt 179.0 lb

## 2022-01-19 DIAGNOSIS — I2510 Atherosclerotic heart disease of native coronary artery without angina pectoris: Secondary | ICD-10-CM

## 2022-01-19 DIAGNOSIS — I10 Essential (primary) hypertension: Secondary | ICD-10-CM | POA: Diagnosis not present

## 2022-01-19 DIAGNOSIS — I480 Paroxysmal atrial fibrillation: Secondary | ICD-10-CM

## 2022-01-19 DIAGNOSIS — D649 Anemia, unspecified: Secondary | ICD-10-CM

## 2022-01-19 DIAGNOSIS — Z7901 Long term (current) use of anticoagulants: Secondary | ICD-10-CM | POA: Diagnosis not present

## 2022-01-19 DIAGNOSIS — E785 Hyperlipidemia, unspecified: Secondary | ICD-10-CM | POA: Diagnosis not present

## 2022-01-19 DIAGNOSIS — Z8673 Personal history of transient ischemic attack (TIA), and cerebral infarction without residual deficits: Secondary | ICD-10-CM

## 2022-01-19 DIAGNOSIS — J449 Chronic obstructive pulmonary disease, unspecified: Secondary | ICD-10-CM

## 2022-01-19 LAB — CBC
Hematocrit: 36.3 % (ref 34.0–46.6)
Hemoglobin: 12 g/dL (ref 11.1–15.9)
MCH: 30.2 pg (ref 26.6–33.0)
MCHC: 33.1 g/dL (ref 31.5–35.7)
MCV: 91 fL (ref 79–97)
Platelets: 254 10*3/uL (ref 150–450)
RBC: 3.98 x10E6/uL (ref 3.77–5.28)
RDW: 12.4 % (ref 11.7–15.4)
WBC: 8.1 10*3/uL (ref 3.4–10.8)

## 2022-01-19 LAB — POCT INR: INR: 2.2 (ref 2.0–3.0)

## 2022-01-19 NOTE — Progress Notes (Signed)
?Cardiology Office Note:   ? ?Date:  01/21/2022  ? ?ID:  Laurie Horton, DOB 15-Jul-1946, MRN [id number] ? ?PCP:  Laurie Seashore, MD ?  ?[facility] Providers ?Cardiologist:  Laurie Martinique, MD    ? ?Referring MD: Laurie Seashore, MD  ? ?Chief Complaint  ?Patient presents with  ? Follow-up  ?  5 weeks.  ? Headache  ? Shortness of Breath  ? Edema  ? Atrial Fibrillation  ? ? ?History of Present Illness:   ? ?Laurie Horton is a 76 y.o. female with a hx of COPD on 2 L nasal cannula at baseline, CAD, HTN, HLD, prior CVA, PAD, PFO, PAF on Coumadin and history of chronic systolic heart failure with improved EF. She was admitted to the hospital in April 2018 with worsening dyspnea on exertion and palpitation and was noted to be in atrial fibrillation with RVR. Echocardiogram at the time showed EF 20 to 25%. Patient underwent IV diuresis. Subsequent left and right heart cath performed on 03/31/2017 demonstrated severe two-vessel CAD with 100% RCA occlusion, 75% OM1, and 90% small OM 2, EF 25 to 30%. There was mild pulmonary hypertension with normal LV filling pressure.  It was felt her cardiomyopathy was ischemic.  Medical therapy was recommended. Repeat echocardiogram in September 2018 demonstrated EF improved to 50 to 55%.  Due to claudication symptoms, she was referred to Dr. Gwenlyn Horton and underwent lower extremity angiogram that showed 80% infrarenal aortic stenosis and left iliac stenosis. She had severe right common femoral artery stenosis. Patient was seen by Dr. Trula Horton for consideration of aortobifemoral bypass, however after pulmonary evaluation, she was felt to be too high of risk for open surgery. She eventually underwent atherectomy and covered stenting of the left iliac by Dr. Gwenlyn Horton in March 2019. Her ABI and claudication symptoms did improve. The aortic and the right common femoral artery stenosis was felt not amenable to percutaneous therapy.  She also has history of high-grade left subclavian artery  stenosis, however it was not clear whether this was symptomatic or not.  Given her other medical problems, this was managed medically.  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 INR was 1.9 at the time. Plavix was added to her medical regimen.  MRI showed no acute infarct however she did have evidence of multiple old stroke. She sees Dr. Chase Horton for her COPD. Carotid ultrasound obtained on 11/27/2021 demonstrated 1 to 39% right ICA stenosis, 40 to 59% left ICA stenosis, disturbed right subclavian artery flow, monophasic left subclavian artery flow, there was 60 mmHg pressure gradient between the arms with the left being the lowest.  Right brachial artery demonstrated triphasic flow, left brachial artery demonstrated monophasic flow. ? ?More recently, patient presented to the hospital on 12/06/2021 with palpitations, chest pain, she was Horton to be in A-fib with RVR.  Serial troponin went from 9 --> 24 --> 36.  While in the ED, patient converted to sinus rhythm spontaneously. She was seen by the cardiology fellow who felt that she was not in CHF. Echocardiogram obtained on the same day showed EF 55 to 60%, no regional wall motion abnormality, mild LVH, mild LAE. She was discharged to follow-up with cardiology service as outpatient. ? ?Since discharge from ED, she had persistent fatigue and weakness.  While her daughter picked up her from the emergency room, she noted the patient had a facial droop that was not there before.  On physical exam,  she had left lower extremity weakness that appears to be chronic since the previous stroke.  Her grip on the left side is mildly weaker than the right side.  Her heart rate has been elevated when she tried to get up, I suspect she was mildly dehydrated and asked her to increase hydration.  We recommended 2 weeks heart monitor and a CT of the head.  CT of the head obtained on 12/08/2021 was negative for acute intracranial  abnormality.  Heart monitor obtained in February showed several episodes of A-fib and SVT, however A-fib burden was fairly low at 2%. ? ?Patient presents today for follow-up.  We discussed the recent heart monitor report, she had only 2% of A-fib burden.  We decided to continue on the current therapy.  A lot of the time when she is symptomatic with dizziness and palpitation, the underlying rhythm was actually [name] and PVCs.  However she was clearly symptomatic with A-fib as well.  Given the low A-fib burden, I recommended continue carvedilol at 12.5 mg twice a day.  She is on both Plavix and Coumadin.  About a month ago, she was sneezing and had a significant nosebleed and was coughing up blood afterward.  If the symptom recurs, I would recommend discontinue the Plavix altogether and continue on Coumadin by itself ? ? ?Past Medical History:  ?Diagnosis Date  ? Acute diastolic heart failure, NYHA class 1 ([place]) 02/08/2017  ? Acute heart failure ([place]) 02/08/2017  ? Cataract   ? OD  ? CHF (congestive heart failure) ([place]) 03/02/2017  ? EF 25-30% 2018  ? Complication of anesthesia   ? various issues with oxygen  saturations post op  ? COPD (chronic obstructive pulmonary disease) ([place])   ? Depression   ? Depression   ? Phreesia 11/04/2020  ? Diverticulitis   ? DVT (deep venous thrombosis) ([place])   ? Fibromyalgia   ? GERD (gastroesophageal reflux disease)   ? Heart murmur   ? Phreesia 11/04/2020  ? Hiatal hernia   ? History of deviated nasal septum   ? left- side  ? HTN (hypertension)   ? Hyperlipidemia   ? Hypertension   ? Phreesia 11/04/2020  ? Hypertensive retinopathy   ? OU  ? Myocardial infarction [facility])   ? Phreesia 11/04/2020  ? Obesity   ? On supplemental oxygen therapy   ? concentrator at night @ 1.5 l/m or when sleeps. O2 Sat niormally 87.  ? Osteoporosis   ? Osteoporosis   ? Phreesia 11/04/2020  ? Oxygen deficiency   ? Phreesia 11/04/2020  ? PAT (paroxysmal atrial tachycardia) ([place])   ? PFO (patent foramen ovale)   ?  PULMONARY NODULE, LEFT LOWER LOBE 10/14/2009  ? 57m LLL nodule dec 2010. Stable and 416min Oct 2012. No further fu  ? PVD (peripheral vascular disease) [place]audication [place] [place]  ? Right middle lobe pneumonia 07/24/2011  ? First noted at admit 07/10/11. Persists on cxr 07/22/11. Cleared on CT 08/24/11. No further followup  ? [facility]  ? TOBACCO ABUSE 06/04/2009  ? ? ?Past Surgical History:  ?Procedure Laterality Date  ? ABDOMINAL HYSTERECTOMY N/A   ? Phreesia 11/04/2020  ? CARDIOVASCULAR STRESS TEST  12/26/2004  ? EF 74%. NO EVIDENCE OF ISCHEMIA  ? CATARACT EXTRACTION Left   ? Dr. R.Elliot Dally? ESOPHAGOGASTRODUODENOSCOPY (EGD) WITH PROPOFOL N/A 04/15/2015  ? Procedure: ESOPHAGOGASTRODUODENOSCOPY (EGD) WITH PROPOFOL;  Surgeon: JaLaurence SpatesMD;  Location: [facility];  Service: Endoscopy;  Laterality:  N/A;  ? EYE SURGERY Left   ? Cat Sx  ? JOINT REPLACEMENT N/A   ? Phreesia 11/04/2020  ? KNEE ARTHROSCOPY  2000  ? left  ? LAPAROSCOPIC CHOLECYSTECTOMY  04-16-2010  ? cornett  ? LOWER EXTREMITY ANGIOGRAPHY N/A 09/09/2017  ? Procedure: Lower Extremity Angiography;  Surgeon: Lorretta Harp, MD;  Location: [place] CV LAB;  Service: Cardiovascular;  Laterality: N/A;  ? LOWER EXTREMITY INTERVENTION Left 01/17/2018  ? Procedure: LOWER EXTREMITY INTERVENTION;  Surgeon: Lorretta Harp, MD;  Location: [place] CV LAB;  Service: Cardiovascular;  Laterality: Left;  ? MOUTH SURGERY    ? 03-26-15 multiple extractions stitches remains  ? PERIPHERAL VASCULAR INTERVENTION Left 01/17/2018  ? Procedure: PERIPHERAL VASCULAR INTERVENTION;  Surgeon: Lorretta Harp, MD;  Location: [place] CV LAB;  Service: Cardiovascular;  Laterality: Left;  COMMON ILIAC  ? RIGHT/LEFT HEART CATH AND CORONARY ANGIOGRAPHY N/A 03/04/2017  ? Procedure: Right/Left Heart Cath and Coronary Angiography;  Surgeon: Laurie M Martinique, MD;  Location: [place] CV LAB;  Service: Cardiovascular;  Laterality: N/A;  ? TOTAL ABDOMINAL HYSTERECTOMY    ? post op  needed oxygen was told "she gave them a scare"  ? TUBAL LIGATION    ? US ECHOCARDIOGRAPHY  11/20/2009  ? EF 55-60%  ? ? ?Current Medications: ?Current Meds  ?Medication Sig  ? carvedilol (COREG) 12.5 MG table

## 2022-01-19 NOTE — Patient Instructions (Signed)
Continue taking 1/2 a tablet daily, except 1 tablet on Wednesday. Recheck INR in 6 weeks. Coumadin Clinic [phone].   ?

## 2022-01-19 NOTE — Patient Instructions (Addendum)
Medication Instructions:  ?Your physician recommends that you continue on your current medications as directed. Please refer to the Current Medication list given to you today. ? ?*If you need a refill on your cardiac medications before your next appointment, please call your pharmacy* ? ?Lab Work: ?Your physician recommends that you return for lab work TODAY:  ?CBC ?If you have labs (blood work) drawn today and your tests are completely normal, you will receive your results only by: ?MyChart Message (if you have MyChart) OR ?A paper copy in the mail ?If you have any lab test that is abnormal or we need to change your treatment, we will call you to review the results. ? ?Testing/Procedures: ?NONE ordered at this time of appointment  ? ?Follow-Up: ?At [facility], you and your health needs are our priority.  As part of our continuing mission to provide you with exceptional heart care, we have created designated Provider Care Teams.  These Care Teams include your primary Cardiologist (physician) and Advanced Practice Providers (APPs -  Physician Assistants and Nurse Practitioners) who all work together to provide you with the care you need, when you need it. ? ?Your next appointment:   ?As scheduled  04/13/22 at 11:40 AM ? ?The format for your next appointment:   ?In Person ? ?Provider:   ?Peter Martinique, MD  ?  ?Other Instructions ? ? ?

## 2022-02-16 ENCOUNTER — Encounter (HOSPITAL_COMMUNITY): Payer: Self-pay | Admitting: Psychiatry

## 2022-02-16 ENCOUNTER — Telehealth (HOSPITAL_BASED_OUTPATIENT_CLINIC_OR_DEPARTMENT_OTHER): Payer: Medicare Other | Admitting: Psychiatry

## 2022-02-16 DIAGNOSIS — F331 Major depressive disorder, recurrent, moderate: Secondary | ICD-10-CM

## 2022-02-16 DIAGNOSIS — F411 Generalized anxiety disorder: Secondary | ICD-10-CM | POA: Diagnosis not present

## 2022-02-16 MED ORDER — DESVENLAFAXINE SUCCINATE ER 50 MG PO TB24: 50.0000 mg | ORAL_TABLET | Freq: Every day | ORAL | 2 refills | Status: DC

## 2022-02-16 MED ORDER — TRAZODONE HCL 100 MG PO TABS: ORAL_TABLET | ORAL | 2 refills | Status: DC

## 2022-02-16 NOTE — Progress Notes (Signed)
Virtual Visit via Telephone Note ? ?I connected with Laurie Horton on 02/16/22 at  8:40 AM EDT by telephone and verified that I am speaking with the correct person using two identifiers. ? ?Location: ?Patient: Home ?Provider: Home Office ?  ?I discussed the limitations, risks, security and privacy concerns of performing an evaluation and management service by telephone and the availability of in person appointments. I also discussed with the patient that there may be a patient responsible charge related to this service. The patient expressed understanding and agreed to proceed. ? ? ?History of Present Illness: ?Patient is phone session.  She was recently admitted to the hospital for increased heart rate the patient has history of A-fib, CHF and COPD.  No new medication added.  Patient doing better and recently she had a visit to her granddaughter who live 30 minutes away.  Patient told it was like gender identity day and she is pleased that both of her granddaughter are pregnant.  1 is due next month and another is due in October.  However she is concerned about her daughter who continues to have a lot of mood swings.  She refused to see psychiatrist.  Patient told me when her granddaughter's concern about their mother's behavior.  Patient lives by herself.  She tried to spend time in the backyard.  She drives.  She has no drug or shakes or any EPS.  She denies any crying spells or any.  Hopelessness or worthlessness.  She feels sometimes anxious around people or crowded places any major panic attack she feels the trazodone and Pristiq working well.  She see pain management taking pain medicine for her back.  Her physician recently recommended physical therapy and she is going to start soon.  Appetite is okay.  Her weight is stable.  She sleeps for 5 hours.  She uses 2 L oxygen.  She denies drinking or using any illegal substances. ? ?Past Psychiatric History:  ?H/O depression and anxiety. No H/O inpatient  treatment, suicidal attempt, paranoia, mania and hallucination. Seeing psychiatrist in 2006 after a stroke.  Tried Cymbalta, lexapro and Celexa in the past with limited response.  Tried Ambien, Lunesta, lexapro, Lamictal and Xanax but developed allergies and side effects.  Tried higher Pristiq but has side effects. Valium helped in past. ? ?Psychiatric Specialty Exam: ?Physical Exam  ?Review of Systems  ?Weight 179 lb (81.2 kg).There is no height or weight on file to calculate BMI.  ?General Appearance: NA  ?Eye Contact:  NA  ?Speech:  Normal Rate  ?Volume:  Normal  ?Mood:  Anxious  ?Affect:  NA  ?Thought Process:  Descriptions of Associations: Intact  ?Orientation:  Full (Time, Place, and Person)  ?Thought Content:  WDL  ?Suicidal Thoughts:  No  ?Homicidal Thoughts:  No  ?Memory:  Immediate;   Fair ?Recent;   Fair ?Remote;   Fair  ?Judgement:  Intact  ?Insight:  Present  ?Psychomotor Activity:  NA  ?Concentration:  Concentration: Good and Attention Span: Fair  ?Recall:  Fair  ?Fund of Knowledge:  Good  ?Language:  Good  ?Akathisia:  No  ?Handed:  Right  ?AIMS (if indicated):     ?Assets:  Communication Skills ?Desire for Improvement ?Housing ?Resilience  ?ADL's:  Intact  ?Cognition:  WNL  ?Sleep:   4 hrs  ?  ? ? ?Assessment and Plan: ?Major depressive disorder, generalized anxiety disorder. ? ?Patient is stable on Pristiq 50 mg daily and trazodone 100 bedtime. Recommended to use melatonin  3 mg as needed for insomnia if trazodone not helpful. Follow up in 3 months.  ? ?Follow Up Instructions: ? ?  ?I discussed the assessment and treatment plan with the patient. The patient was provided an opportunity to ask questions and all were answered. The patient agreed with the plan and demonstrated an understanding of the instructions. ?  ?The patient was advised to call back or seek an in-person evaluation if the symptoms worsen or if the condition fails to improve as anticipated. ? ?Collaboration of Care: Primary Care  Provider AEB notes are in epic to review.  ? ?Patient/Guardian was advised Release of Information must be obtained prior to any record release in order to collaborate their care with an outside provider. Patient/Guardian was advised if they have not already done so to contact the registration department to sign all necessary forms in order for [place] to release information regarding their care.  ? ?Consent: Patient/Guardian gives verbal consent for treatment and assignment of benefits for services provided during this visit. Patient/Guardian expressed understanding and agreed to proceed.   ? ?I provided 19 minutes of non-face-to-face time during this encounter. ? ? ?Kathlee Nations, MD  ?

## 2022-03-01 ENCOUNTER — Other Ambulatory Visit: Payer: Self-pay | Admitting: Cardiology

## 2022-03-02 ENCOUNTER — Ambulatory Visit (INDEPENDENT_AMBULATORY_CARE_PROVIDER_SITE_OTHER): Payer: Medicare Other

## 2022-03-02 ENCOUNTER — Telehealth: Payer: Self-pay | Admitting: Cardiology

## 2022-03-02 DIAGNOSIS — I480 Paroxysmal atrial fibrillation: Secondary | ICD-10-CM

## 2022-03-02 DIAGNOSIS — Z7901 Long term (current) use of anticoagulants: Secondary | ICD-10-CM

## 2022-03-02 LAB — POCT INR: INR: 1.7 — AB (ref 2.0–3.0)

## 2022-03-02 MED ORDER — CARVEDILOL 12.5 MG PO TABS: 12.5000 mg | ORAL_TABLET | Freq: Two times a day (BID) | ORAL | 3 refills | Status: DC

## 2022-03-02 NOTE — Addendum Note (Signed)
Addended by: Kathyrn Lass on: 03/02/2022 08:48 AM ? ? Modules accepted: Orders ? ?

## 2022-03-02 NOTE — Telephone Encounter (Signed)
Called patient left message on personal voice mail Carvedilol refill sent to your pharmacy.Advised to keep appointment as planned with Dr.Jordan. ?

## 2022-03-02 NOTE — Telephone Encounter (Signed)
Patient came into office stating that [facility] said they contacted [place] regarding pt refill for carvediolol. Pt took last dose today and will need medication for tomorrow. Advised pt I would let clinical know, and if we had any questions someone will contact her  ?

## 2022-03-02 NOTE — Patient Instructions (Signed)
TAKE 1 TABLET TODAY ONLY and then Continue taking 1/2 a tablet daily, except 1 tablet on Wednesday. Recheck INR in 4 weeks. Coumadin Clinic [phone].   ?

## 2022-03-04 ENCOUNTER — Other Ambulatory Visit: Payer: Self-pay | Admitting: Cardiology

## 2022-04-02 ENCOUNTER — Ambulatory Visit (INDEPENDENT_AMBULATORY_CARE_PROVIDER_SITE_OTHER): Payer: Medicare Other

## 2022-04-02 DIAGNOSIS — I480 Paroxysmal atrial fibrillation: Secondary | ICD-10-CM

## 2022-04-02 DIAGNOSIS — Z7901 Long term (current) use of anticoagulants: Secondary | ICD-10-CM | POA: Diagnosis not present

## 2022-04-02 LAB — POCT INR: INR: 2.6 (ref 2.0–3.0)

## 2022-04-02 NOTE — Patient Instructions (Signed)
Continue taking 1/2 a tablet daily, except 1 tablet on Wednesday. Recheck INR in 6 weeks. Coumadin Clinic [phone].

## 2022-04-09 NOTE — Progress Notes (Unsigned)
Cardiology Office Note    Date:  04/09/2022   ID:  Laurie Horton, Laurie Horton 12-31-1945, MRN [id number]  PCP:  Georgianne Fick, MD  Cardiologist: Dr. Swaz[place]   No chief complaint on file.    History of Present Illness:    Laurie Horton is a 76 y.o. female with past medical history of chronic diastolic CHF, PFO, PAF (on Coumadin), COPD (on 2L [place] at baseline), HTN, and prior CVA who is seen for follow up CHF and PAD.  She was admitted from 4/9 - 02/12/2017 for worsening dyspnea on exertion and palpitations. Was in atrial fibrillation with RVR upon arrival to the ED. Echo during admission showed a newly reduced EF of 20-25% and she was diuresed with IV Lasix. Enzymes were negative and EKG showed no acute ischemic changes. It was recommended to consider a right/left heart cath in 4-6 weeks.   She did undergo right and left heart cath on 03/04/17. This showed severe 2 vessel obstructive CAD with 100% RCA occlusion, 75% OM1, and 90% small OM2. EF 25-30%. Mild pulmonary HTN with normal LV filling pressures. 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 Maximizing CHF therapy recommended.  She did have repeat Echo in September 2018 showing improvement in EF to 50-55%.   Subsequent to this she developed significant claudication. She was seen by Dr. Allyson Sabal and had angiography showing 80% infrarenal aortic stenosis and left iliac stenosis. She also had severe right common femoral stenosis. She was seen by Dr Myra Gianotti for consideration of Aortobifemoral bypass. After pulmonary evaluation she was felt to be too high a risk for open surgery. In March 2019 she underwent atherectomy and covered stenting of the left iliac by Dr. Allyson Sabal. On follow up she did have improvement in her claudication and ABIs. The aortic and right common femoral artery stenoses are not felt to be amenable to percutaneous therapy.   In early July 2019 she was seen because she felt she was in Afib following dental procedure. On arrival she  was in NSR with PACs. No medical changes made.   She has been followed by Dr Allyson Sabal and  dopplers in May 2020 indicated continued patency of the stent. No significant claudication. She was noted to have a high grade left subclavian stenosis but it was unclear that this was symptomatic and given all her medical problems was felt best to manage medically. She had left shoulder injection by Dr Cleophas Dunker for adhesive capsulitis. She notes this has helped with her pain significantly. She is followed by pulmonary for COPD.   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 INR had been therapeutic. At time of infarct it was 1.9. We decided to add plavix 75 mg daily due to her extensive vascular disease. MRI showed no acute infarct but she did have evidence of multiple old strokes.   She had follow up LE arterial dopplers in May 2021 which were stable. Carotid dopplers in January were unchanged.  She had PNA in Dec/Jan. Followed by Dr Colletta Maryland for COPD exacerbation. Had CT done. Aortic size at 4.1 cm.    Patient presented to the hospital on 12/06/2021 with palpitations, chest pain, she was found to be in A-fib with RVR.  Serial troponin went from 9 --> 24 --> 36.  While in the ED, patient converted to sinus rhythm spontaneously. She was seen by the cardiology fellow who felt that she was not in CHF. Echocardiogram obtained  on the same day showed EF 55 to 60%, no regional wall motion abnormality, mild LVH, mild LAE. She was discharged to follow-up with cardiology service as outpatient.  Since discharge from ED, she had persistent fatigue and weakness.  While her daughter picked up her from the emergency room, she noted the patient had a facial droop that was not there before.  On physical exam, she had left lower extremity weakness that appears to be chronic since the previous stroke.  Her grip on the left side is mildly weaker than the right side.  Her heart  rate has been elevated when she tried to get up, I suspect she was mildly dehydrated and asked her to increase hydration.  We recommended 2 weeks heart monitor and a CT of the head.  CT of the head obtained on 12/08/2021 was negative for acute intracranial abnormality.  Heart monitor obtained in February showed several episodes of A-fib and SVT, however A-fib burden was fairly low at 2%.  When seen in follow up she was continued on the current therapy.  A lot of the time when she is symptomatic with dizziness and palpitation, the underlying rhythm was actually [facility] and PVCs.  However she was clearly symptomatic with A-fib as well.  Given the low A-fib burden, continue carvedilol at 12.5 mg twice a day.  She is on both Plavix and Coumadin.    Past Medical History:  Diagnosis Date   Acute diastolic heart failure, NYHA class 1 (HCC) 02/08/2017   Acute heart failure (HCC) 02/08/2017   Cataract    OD   CHF (congestive heart failure) (HCC) 03/02/2017   EF 25-30% 2018   Complication of anesthesia    various issues with oxygen  saturations post op   COPD (chronic obstructive pulmonary disease) (HCC)    Depression    Depression    Phreesia 11/04/2020   Diverticulitis    DVT (deep venous thrombosis) (HCC)    Fibromyalgia    GERD (gastroesophageal reflux disease)    Heart murmur    Phreesia 11/04/2020   Hiatal hernia    History of deviated nasal septum    left- side   HTN (hypertension)    Hyperlipidemia    Hypertension    Phreesia 11/04/2020   Hypertensive retinopathy    OU   Myocardial infarction (HCC)    Phreesia 11/04/2020   Obesity    On supplemental oxygen therapy    concentrator at night @ 1.5 l/m or when sleeps. O2 Sat niormally 87.   Osteoporosis    Osteoporosis    Phreesia 11/04/2020   Oxygen deficiency    Phreesia 11/04/2020   PAT (paroxysmal atrial tachycardia) (HCC)    PFO (patent foramen ovale)    PULMONARY NODULE, LEFT LOWER LOBE 10/14/2009   5mm LLL nodule dec 2010. Stable  and 4mm in Oct 2012. 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 Persists on cxr 07/22/11. Cleared on CT 08/24/11. No further followup   Stroke [facility])    TOBACCO ABUSE 06/04/2009    Past Surgical History:  Procedure Laterality Date   ABDOMINAL HYSTERECTOMY N/A    Phreesia 11/04/2020   CARDIOVASCULAR STRESS TEST  12/26/2004   EF 74%. NO EVIDENCE OF ISCHEMIA   CATARACT EXTRACTION Left    Dr. Hortense Ramal   ESOPHAGOGASTRODUODENOSCOPY (EGD) WITH PROPOFOL N/A 04/15/2015   Procedure: ESOPHAGOGASTRODUODENOSCOPY (EGD) WITH PROPOFOL;  Surgeon:  Carman Ching, MD;  Location: [facility];  Service: Endoscopy;  Laterality: N/A;   EYE SURGERY Left    Cat Sx   JOINT REPLACEMENT N/A    Phreesia 11/04/2020   KNEE ARTHROSCOPY  2000   left   LAPAROSCOPIC CHOLECYSTECTOMY  04-16-2010   [facility]   LOWER EXTREMITY ANGIOGRAPHY N/A 09/09/2017   Procedure: Lower Extremity Angiography;  Surgeon: Runell Gess, MD;  Location: [facility] INVASIVE CV LAB;  Service: Cardiovascular;  Laterality: N/A;   LOWER EXTREMITY INTERVENTION Left 01/17/2018   Procedure: LOWER EXTREMITY INTERVENTION;  Surgeon: Runell Gess, MD;  Location: [facility] INVASIVE CV LAB;  Service: Cardiovascular;  Laterality: Left;   MOUTH SURGERY     03-26-15 multiple extractions stitches remains   PERIPHERAL VASCULAR INTERVENTION Left 01/17/2018   Procedure: PERIPHERAL VASCULAR INTERVENTION;  Surgeon: Runell Gess, MD;  Location: [facility] INVASIVE CV LAB;  Service: Cardiovascular;  Laterality: Left;  COMMON ILIAC   RIGHT/LEFT HEART CATH AND CORONARY ANGIOGRAPHY N/A 03/04/2017   Procedure: Right/Left Heart Cath and Coronary Angiography;  Surgeon: Rumor Sun M Swaziland, MD;  Location: [facility] INVASIVE CV LAB;  Service: Cardiovascular;  Laterality: N/A;   TOTAL ABDOMINAL HYSTERECTOMY     post op needed oxygen was told "she gave them a scare"   TUBAL LIGATION     US  ECHOCARDIOGRAPHY  11/20/2009   EF 55-60%    Current Medications: Outpatient Medications Prior to Visit  Medication Sig Dispense Refill   carvedilol (COREG) 12.5 MG tablet Take 1 tablet (12.5 mg total) by mouth 2 (two) times daily. 180 tablet 3   clopidogrel (PLAVIX) 75 MG tablet TAKE 1 TABLET BY MOUTH EVERY DAY (Patient taking differently: Take 75 mg by mouth daily.) 30 tablet 11   desvenlafaxine (PRISTIQ) 50 MG 24 hr tablet Take 1 tablet (50 mg total) by mouth daily. 30 tablet 2   diclofenac sodium (VOLTAREN) 1 % GEL Apply 2 g topically 4 (four) times daily.  5   Eyelid Cleansers (OCUSOFT BABY EYELID & EYELASH EX) Apply topically.     furosemide (LASIX) 40 MG tablet TAKE 1 TABLET BY MOUTH EVERY DAY (Patient taking differently: Take 40 mg by mouth daily.) 30 tablet 11   gabapentin (NEURONTIN) 100 MG capsule Take 200 mg by mouth See admin instructions. Take 1 in am and 1 in the lunch and 2 at bedtime     HYDROcodone-acetaminophen (NORCO/VICODIN) 5-325 MG tablet Take 1 tablet by mouth 3 (three) times daily.     ipratropium (ATROVENT) 0.02 % nebulizer solution USE 1 VIAL BY NEBULIZATION 4 (FOUR) TIMES DAILY. 62.5 mL 3   lidocaine (LIDODERM) 5 % Place 1-3 patches onto the skin as directed.     montelukast (SINGULAIR) 10 MG tablet TAKE 1 TABLET BY MOUTH EVERYDAY AT BEDTIME (Patient taking differently: Take 10 mg by mouth daily.) 90 tablet 2   Multiple Vitamins-Minerals (VITAMIN D3 COMPLETE PO) Take by mouth.     nitroGLYCERIN (NITROSTAT) 0.4 MG SL tablet PLACE 1 TABLET UNDER THE TONGUE EVERY 5 MINUTES AS NEEDED FOR CHEST PAIN (Patient taking differently: Place 0.4 mg under the tongue every 5 (five) minutes as needed for chest pain.) 25 tablet 3   Omega-3 Fatty Acids (FISH OIL) 1000 MG CAPS Take 1,000 mg by mouth daily.     ondansetron (ZOFRAN-ODT) 8 MG disintegrating tablet Take 1 tablet (8 mg total) by mouth every 8 (eight) hours as needed for nausea or vomiting. 20 tablet 0   OXYGEN Inhale 1.5-3 L  into the lungs  as needed (for shortness of breath).     pantoprazole (PROTONIX) 40 MG tablet TAKE 1 TABLET BY MOUTH EVERY DAY (Patient taking differently: Take 40 mg by mouth daily.) 30 tablet 5   polyethylene glycol (MIRALAX / GLYCOLAX) packet Take 17 g by mouth daily as needed for mild constipation.      PRALUENT 75 MG/ML SOAJ INJECT 75 MG INTO THE SKIN EVERY 14 (FOURTEEN) DAYS. 2 mL 11   spironolactone (ALDACTONE) 25 MG tablet TAKE 1 TABLET BY MOUTH EVERY DAY 30 tablet 11   traZODone (DESYREL) 100 MG tablet Take one tab daily as needed for sleep 30 tablet 2   triamcinolone cream (KENALOG) 0.1 % Apply 1 application topically as needed (Yeast Infection).     warfarin (COUMADIN) 5 MG tablet TAKE 1/2 TO 1 TABLET BY MOUTH EVERY DAY OR AS DIRECTED BY COUMADIN CLINIC (Patient taking differently: Take 5 mg by mouth as directed. Take 1/2 (2.5 mg) tablet everyday except on Wednesday. On Wednesday take 1 (5 mg) tablet as directed by Coumadin Clinic) 30 tablet 5   No facility-administered medications prior to visit.     Allergies:   Alprazolam, Bee venom, Iodine, Pseudoephedrine hcl er, Budesonide-formoterol fumarate, Crestor [rosuvastatin calcium], Esomeprazole magnesium, Flonase [fluticasone propionate], Lamictal [lamotrigine], Loratadine, Lotrimin [clotrimazole], Lunesta [eszopiclone], Oxcarbazepine, Statins, Zolpidem tartrate, Betadine [povidone iodine], Bevespi aerosphere [glycopyrrolate-formoterol], Breztri aerosphere [budeson-glycopyrrol-formoterol], Clarithromycin, Effexor [venlafaxine], Lexapro [escitalopram oxalate], Aciphex [rabeprazole sodium], Alendronate sodium, Avelox [moxifloxacin hcl in nacl], Bextra [valdecoxib], Ceclor [cefaclor], Cephalexin, Covera-hs [verapamil hcl], Dicyclomine hcl, Other, and Tessalon perles   Social History   Socioeconomic History   Marital status: Divorced    Spouse name: Not on file   Number of children: 2   Years of education: Not on file   Highest education  level: Not on file  Occupational History   Occupation: disability  Tobacco Use   Smoking status: Former    Packs/day: 3.00    Years: 30.00    Total pack years: 90.00    Types: Cigarettes    Quit date: 06/02/2010    Years since quitting: 11.8   Smokeless tobacco: Never   Tobacco comments:    QUIT IN 2011  Vaping Use   Vaping Use: Never used  Substance and Sexual Activity   Alcohol use: No    Alcohol/week: 0.0 standard drinks of alcohol   Drug use: No   Sexual activity: Yes    Partners: Male    Birth control/protection: Post-menopausal, Surgical  Other Topics Concern   Not on file  Social History Narrative   Marital status: divorced in 1978 after ten years; dating casually in 2019.      Children: 3 biological children; 3 court appointed children; 6 grandchildren; 5 gg      Lives: alone; children in [place] and [place].      Employment: retired age 56; disability for COPD, CVA at age 61.      Tobacco: former smoker; quit smoking 2011.      Alcohol: none      Exercise: walks dog four times per day; goes to pool three times per week.      ADLs: drives; no assistant devices; does have a walker.  Cleaning is limited in 2018.  Daughter helps with cleaning.  Does own grocery shopping.      Advanced Directives: YES; DNR/DNI; HCPOA: [name] Martin/daughter youngest.  Blind.               Social Determinants of Health   Financial  Resource Strain: Not on file  Food Insecurity: Not on file  Transportation Needs: Not on file  Physical Activity: Not on file  Stress: Not on file  Social Connections: Not on file     Family History:  The patient's family history includes Alzheimer's disease in her mother; Dementia in her mother; Diabetes in her mother and sister; Heart attack in her father; Heart attack (age of onset: 52) in her brother; Schizophrenia in her sister; Tremor in her sister.   Review of Systems:   As noted in HPI.  All other systems reviewed and are otherwise  negative except as noted above.   Physical Exam:    VS:  There were no vitals taken for this visit.   GENERAL:  Well appearing overweight WF in NAD HEENT:  PERRL, EOMI, sclera are clear. Oropharynx is clear. NECK:  No jugular venous distention, carotid upstroke brisk and symmetric, right carotid bruit,  left subclavian  bruit, no thyromegaly or adenopathy LUNGS:  Clear to auscultation bilaterally CHEST:  Unremarkable HEART:  RRR,  PMI not displaced or sustained,S1 and S2 within normal limits, no S3, no S4: no clicks, no rubs, no murmurs ABD:  Soft, nontender. BS +, no masses or bruits. No hepatomegaly, no splenomegaly EXT:  Poor pedal pulses, absent left radial pulse.  no edema, no cyanosis no clubbing SKIN:  Warm and dry.  No rashes NEURO:  Alert and oriented x 3. Cranial nerves II through XII intact. PSYCH:  Cognitively intact    Wt Readings from Last 3 Encounters:  01/19/22 179 lb (81.2 kg)  12/08/21 178 lb 12.8 oz (81.1 kg)  12/05/21 181 lb (82.1 kg)     Studies/Labs Reviewed:   Recent Labs: 07/16/2021: NT-Pro BNP 363 12/05/2021: ALT 12; B Natriuretic Peptide 63.0; BUN 18; Creatinine, Ser 1.09; Potassium 3.7; Sodium 135 01/19/2022: Hemoglobin 12.0; Platelets 254   Lipid Panel    Component Value Date/Time   CHOL 132 07/02/2021 1017   CHOL 115 09/17/2020 0946   TRIG 220.0 (H) 07/02/2021 1017   HDL 41.80 07/02/2021 1017   HDL 36 (L) 09/17/2020 0946   CHOLHDL 3 07/02/2021 1017   VLDL 44.0 (H) 07/02/2021 1017   LDLCALC 52 09/17/2020 0946   LDLDIRECT 67.0 07/02/2021 1017    Additional studies/ records that were reviewed today include:   Echocardiogram: 02/09/2017 Study Conclusions   - Left ventricle: The cavity size was mildly dilated. Wall   thickness was increased in a pattern of mild LVH. Systolic   function was severely reduced. The estimated ejection fraction   was in the range of 20% to 25%. Diffuse hypokinesis. Doppler   parameters are consistent with abnormal  left ventricular   relaxation (grade 1 diastolic dysfunction). - Aortic valve: Valve area (Vmax): 1.4 cm^2. - Aortic root: The aortic root was mildly dilated. - Ascending aorta: The ascending aorta was mildly dilated. - Mitral valve: Calcified annulus. There was moderate   regurgitation. - Left atrium: The atrium was moderately dilated. - Right ventricle: Systolic function was moderately reduced. - Pulmonary arteries: Systolic pressure was mildly increased. - Pericardium, extracardiac: A trivial pericardial effusion was   identified.   Impressions:   - No subcostal views; severe global reduction in LV systolic   function; grade 1 diastolic dysfunction; mildly dilated aortic   root and ascending aorta; moderate MR; moderaet LAE; moderately   reduced RV function; mild TR; mildly elevated pulmonary pressure.  Procedures   Right/Left Heart Cath and Coronary Angiography  Conclusion  Ost 1st Mrg to 1st Mrg lesion, 75 %stenosed. 2nd Mrg lesion, 90 %stenosed. Ost RCA to Mid RCA lesion, 100 %stenosed. There is severe left ventricular systolic dysfunction. LV end diastolic pressure is normal. The left ventricular ejection fraction is 25-35% by visual estimate. Hemodynamic findings consistent with mild pulmonary hypertension. LV end diastolic pressure is normal.   1. Severe 2 vessel obstructive CAD    - 75% proximal OM1    - 90% small OM2    - 100% proximal RCA. Left to right collaterals.  2. Severe LV dysfunction 3. Mild pulmonary HTN with normal LV filling pressures.  4. Cardiac index 2.41 L/min/BSA     Plan: Medical management to try and optimize CHF therapy. Patient appears to be adequately diuresed at this time. Based on these results her cardiomyopathy is ischemic. I would treat her CAD medically. If cardiac cath is needed in the future would consider alternative access given difficulty from the right radial approach. Her rhythm during procedure is a multifocal atrial  rhythm/tachycardia.      Echo 07/08/17: Study Conclusions   - Left ventricle: The cavity size was normal. There was moderate   concentric hypertrophy. Systolic function was normal. The   estimated ejection fraction was in the range of 50% to 55%.   Severe hypokinesis of the basal-midinferior myocardium;   consistent with infarction in the distribution of the right   coronary artery. Doppler parameters are consistent with abnormal   left ventricular relaxation (grade 1 diastolic dysfunction). - Mitral valve: Calcified annulus.   Impressions:   - Compared to April 2018 there is marked improvement in contraction   of all LV wall segemnts except for the inferior wall, which   remains severely hypokinetic. There is also marked reduction in   the severity of mitral insufficiency.    Echo 12/06/2021  1. Left ventricular ejection fraction, by estimation, is 55 to 60%. The  left ventricle has normal function. The left ventricle has no regional  wall motion abnormalities. There is mild left ventricular hypertrophy.  Left ventricular diastolic parameters  were normal.   2. Right ventricular systolic function is normal. The right ventricular  size is normal.   3. Left atrial size was mildly dilated.   4. The mitral valve is normal in structure. No evidence of mitral valve  regurgitation. No evidence of mitral stenosis.   5. The aortic valve is tricuspid. There is mild calcification of the  aortic valve. There is mild thickening of the aortic valve. Aortic valve  regurgitation is not visualized. Aortic valve sclerosis is present, with  no evidence of aortic valve stenosis.   6. The inferior vena cava is normal in size with greater than 50%  respiratory variability, suggesting right atrial pressure of 3 mmHg.     Monitor 01/02/22: Study Highlights    Normal sinus rhythm Atrial fibrillation with RVR on 12/19/21 and 12/21/21 with longest episode lasting 5 hours and 40 minutes and average HR 120  bpm. AFib burden 2% Frequent PACs with few short bursts of SVT longest lasting 15 beats Occasional PVCs with rare NSVT longest lasting 5 beats. Symptoms appear to correlate with Afib an SVT.     Patch Wear Time:  13 days and 23 hours (2023-02-10T10:12:14-0500 to 2023-02-24T09:21:34-0500)   Patient had a min HR of 43 bpm, max HR of 235 bpm, and avg HR of 71 bpm. Predominant underlying rhythm was Sinus Rhythm. First Degree AV Block was present. 2 Ventricular Tachycardia runs occurred, the run  with the fastest interval lasting 5 beats with a  max rate of 235 bpm (avg 156 bpm); the run with the fastest interval was also the longest. 7 Supraventricular Tachycardia runs occurred, the run with the fastest interval lasting 4 beats with a max rate of 176 bpm, the longest lasting 15 beats with an  avg rate of 128 bpm. Some episodes of Supraventricular Tachycardia may be possible Atrial Tachycardia with variable block. Atrial Fibrillation occurred (2% burden), ranging from 75-167 bpm (avg of 120 bpm), the longest lasting 5 hours 40 mins with an avg  rate of 116 bpm. Supraventricular Tachycardia and Atrial Fibrillation were detected within +/- 45 seconds of symptomatic patient event(s). Isolated SVEs were frequent (11.5%, [id number]), SVE Couplets were frequent (5.5%, [id number]), and SVE Triplets were  occasional (1.7%, 8129). Isolated VEs were occasional (4.5%, 65291), VE Couplets were rare (<1.0%, 596), and VE Triplets were rare (<1.0%, 41). Ventricular Bigeminy and Trigeminy were present.  Assessment:    No diagnosis found.     Plan:   In order of problems listed above:  1. Chronic Combined Systolic and Diastolic CHF/  Secondary to ischemic/tachycardia mediated Cardiomyopathy - EF 25-30% in 2018 with ischemic cardiomyopathy. Not a candidate for revascularization. With medical management EF improved to 50-55% by Echo.  - she is euvolemic at this time. Weight is down 4 lbs.  - continue BB, aldactone, and  statin. ARB discontinued due to orthostatic dizziness.  2. Paroxysmal Atrial Fibrillation/ multifocal atrial tachycardia - This patients CHA2DS2-VASc Score and unadjusted Ischemic Stroke Rate (% per year) is equal to 9.7 % stroke rate/year from a score of 6 (CHF, HTN, Female, Age, CVA (2)). She denies any evidence of active bleeding. Continue Coumadin for anticoagulation. INR therapeutic today 2.9  3. HTN- controlled.   4. COPD/Emphysema- - followed by Pulmonology.   5. PFO - no plans for closure   6. PAD s/p covered stenting of left iliac. Stenosis in distal aorta and right femoral not amenable to percutaneous therapy. Left subclavian stenosis. Claudication is stable. Followed by Dr Allyson Sabal. Dopplers in May stable.   7. CAD. 100% RCA. 75% OM. Chronic stable angina class 1-2.  No active angina. Rarely uses Ntg.   8. S/p right retinal artery occlusion. Plavix added to coumadin due to severe vascular disease. I feel benefit of Plavix/coumadin combination outweighs risk of bleeding at this time.  9. Old CVAs noted on MRI  10. Hypercholesterolemia. LDL 116 on maximally tolerated statin. Now on Praluent. LDL improved to 52. Last LDL 67.   11. Carotid arterial disease. No new Neurologic symptoms. Dopplers in January 2022 were unchanged. Follow up with Dr Allyson Sabal in January.   12. 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 Reassured her about this. At most will follow up CT in one year.    I will follow up in 6 months   Signed, Clista Rainford [place], MD  04/09/2022 7:45 AM    [facility] [address] [place], [place] [address] Phone: [phone]

## 2022-04-13 ENCOUNTER — Ambulatory Visit (INDEPENDENT_AMBULATORY_CARE_PROVIDER_SITE_OTHER): Payer: Medicare Other | Admitting: Cardiology

## 2022-04-13 ENCOUNTER — Encounter: Payer: Self-pay | Admitting: Cardiology

## 2022-04-13 ENCOUNTER — Other Ambulatory Visit: Payer: Self-pay | Admitting: Cardiology

## 2022-04-13 VITALS — BP 130/66 | HR 54 | Ht 64.0 in | Wt 181.2 lb

## 2022-04-13 DIAGNOSIS — J449 Chronic obstructive pulmonary disease, unspecified: Secondary | ICD-10-CM

## 2022-04-13 DIAGNOSIS — I739 Peripheral vascular disease, unspecified: Secondary | ICD-10-CM

## 2022-04-13 DIAGNOSIS — I5022 Chronic systolic (congestive) heart failure: Secondary | ICD-10-CM

## 2022-04-13 DIAGNOSIS — I2510 Atherosclerotic heart disease of native coronary artery without angina pectoris: Secondary | ICD-10-CM

## 2022-04-13 DIAGNOSIS — I480 Paroxysmal atrial fibrillation: Secondary | ICD-10-CM

## 2022-04-13 NOTE — Patient Instructions (Signed)

## 2022-05-02 ENCOUNTER — Other Ambulatory Visit: Payer: Self-pay | Admitting: Cardiology

## 2022-05-02 ENCOUNTER — Other Ambulatory Visit (HOSPITAL_COMMUNITY): Payer: Self-pay | Admitting: Psychiatry

## 2022-05-02 DIAGNOSIS — F331 Major depressive disorder, recurrent, moderate: Secondary | ICD-10-CM

## 2022-05-14 ENCOUNTER — Ambulatory Visit (INDEPENDENT_AMBULATORY_CARE_PROVIDER_SITE_OTHER): Payer: Medicare Other | Admitting: *Deleted

## 2022-05-14 DIAGNOSIS — Z7901 Long term (current) use of anticoagulants: Secondary | ICD-10-CM | POA: Diagnosis not present

## 2022-05-14 DIAGNOSIS — I480 Paroxysmal atrial fibrillation: Secondary | ICD-10-CM

## 2022-05-14 LAB — POCT INR: INR: 2.3 (ref 2.0–3.0)

## 2022-05-14 NOTE — Patient Instructions (Signed)
Description   Continue taking 1/2 tablet daily except 1 tablet on Wednesday.  Recheck INR in 6 weeks. Coumadin Clinic [phone].        

## 2022-05-18 ENCOUNTER — Encounter (HOSPITAL_COMMUNITY): Payer: Self-pay | Admitting: Psychiatry

## 2022-05-18 ENCOUNTER — Telehealth (HOSPITAL_BASED_OUTPATIENT_CLINIC_OR_DEPARTMENT_OTHER): Payer: Medicare Other | Admitting: Psychiatry

## 2022-05-18 DIAGNOSIS — F411 Generalized anxiety disorder: Secondary | ICD-10-CM | POA: Diagnosis not present

## 2022-05-18 DIAGNOSIS — F331 Major depressive disorder, recurrent, moderate: Secondary | ICD-10-CM

## 2022-05-18 MED ORDER — TRAZODONE HCL 100 MG PO TABS: ORAL_TABLET | ORAL | 2 refills | Status: DC

## 2022-05-18 MED ORDER — DESVENLAFAXINE SUCCINATE ER 50 MG PO TB24: 50.0000 mg | ORAL_TABLET | Freq: Every day | ORAL | 2 refills | Status: DC

## 2022-05-18 NOTE — Progress Notes (Signed)
Virtual Visit via Telephone Note  I connected with Laurie Horton on 05/18/22 at  8:40 AM EDT by telephone and verified that I am speaking with the correct person using two identifiers.  Location: Patient: Home Provider: Home Office   I discussed the limitations, risks, security and privacy concerns of performing an evaluation and management service by telephone and the availability of in person appointments. I also discussed with the patient that there may be a patient responsible charge related to this service. The patient expressed understanding and agreed to proceed.   History of Present Illness: Patient is evaluated by phone session.  She endorses has been sick and did not go for 21 days because of fever, diarrhea, feeling fatigue and noticed more forgetful and confused.  She is not sure what happened but realizes could have a stomach bug.  She admitted more concerned about her general health and restarted using mask and doing online grocery because does not want to go to crowded places.  She recently had a visit with her doctor but no new medication she recalled added.  Patient has history of A-fib, COPD and CHF.  She is now taking oxygen every day.  She lives by herself with 2 cats.  She has vision problems and had difficulty reading things.  She recalled last week went to dentist appointment thought that she had appointment but appointment card shows last year dates.  She feels embarrassed.  Occasionally she gets a phone call from her older daughter.  Her younger daughter is blind.  Her neighbor is very supportive.  Patient admitted anxiety, nervousness around crowded places.  She denies any crying spells or any feeling of hopelessness or worthlessness but does feel very anxious about her general health.  Her appetite is fair.  She may have lost a few pounds but did not recall very well.  She is taking hydrocodone and gabapentin and she noticed some time puffiness around her ankle so not sure  if her blood pressure is okay or maybe she is retaining fluids.  She denies any paranoia, suicidal thoughts.  She denies any tremors or shakes.  She see pain management for her back.  Past Psychiatric History:  H/O depression and anxiety. No H/O inpatient treatment, suicidal attempt, paranoia, mania and hallucination. Seeing psychiatrist in 2006 after a stroke.  Tried Cymbalta, lexapro and Celexa in the past with limited response.  Tried Ambien, Lunesta, lexapro, Lamictal and Xanax but developed allergies and side effects.  Tried higher Pristiq but has side effects. Valium helped in past.  Recent Results (from the past 2160 hour(s))  POCT INR     Status: Abnormal   Collection Time: 03/02/22  8:13 AM  Result Value Ref Range   INR 1.7 (A) 2.0 - 3.0  POCT INR     Status: None   Collection Time: 04/02/22  7:45 AM  Result Value Ref Range   INR 2.6 2.0 - 3.0  POCT INR     Status: None   Collection Time: 05/14/22  8:02 AM  Result Value Ref Range   INR 2.3 2.0 - 3.0      Psychiatric Specialty Exam: Physical Exam  Review of Systems  Weight 180 lb (81.6 kg).There is no height or weight on file to calculate BMI.  General Appearance: NA  Eye Contact:  NA  Speech:  Slow  Volume:  Decreased  Mood:  Anxious  Affect:  NA  Thought Process:  Descriptions of Associations: Intact  Orientation:  Full (  Time, Place, and Person)  Thought Content:  Rumination  Suicidal Thoughts:  No  Homicidal Thoughts:  No  Memory:  Immediate;   Fair Recent;   Fair Remote;   Fair  Judgement:  Fair  Insight:  Shallow  Psychomotor Activity:  NA  Concentration:  Concentration: Fair and Attention Span: Fair  Recall:  [employer] of Knowledge:  Fair  Language:  Fair  Akathisia:  No  Handed:  Right  AIMS (if indicated):     Assets:  Desire for Improvement Housing  ADL's:  Intact  Cognition:  WNL  Sleep:   5-6 hrs      Assessment and Plan: Major depressive disorder, recurrent.  Generalized anxiety  disorder.  Patient described episodes of forgetfulness and some time confusion as recall recent incident at the dentist office when she thought that she had appointment but it was 76-year-old.  I encouraged should contact her physician to check her blood pressure and may need blood work.  Patient has A-fib, CHF and COPD.  I explained some time low oxygen level can cause these episodes and recommend should use oxygen as prescribed by his doctor.  Patient has appointment coming up with his pulmonologist this Thursday.  I reviewed blood work results.  We agreed that should continue Pristiq 50 mg daily and trazodone 100 mg at bedtime.  Recommended to call us back if she has any question or any concern.  Follow-up in 3 months.  Patient is not interested in therapy.  Follow Up Instructions:    I discussed the assessment and treatment plan with the patient. The patient was provided an opportunity to ask questions and all were answered. The patient agreed with the plan and demonstrated an understanding of the instructions.   The patient was advised to call back or seek an in-person evaluation if the symptoms worsen or if the condition fails to improve as anticipated.  Collaboration of Care: Primary Care Provider AEB notes are available in epic to review.  Patient/Guardian was advised Release of Information must be obtained prior to any record release in order to collaborate their care with an outside provider. Patient/Guardian was advised if they have not already done so to contact the registration department to sign all necessary forms in order for [place] to release information regarding their care.   Consent: Patient/Guardian gives verbal consent for treatment and assignment of benefits for services provided during this visit. Patient/Guardian expressed understanding and agreed to proceed.    I provided 25 minutes of non-face-to-face time during this encounter.   Kathlee Nations, MD

## 2022-05-22 ENCOUNTER — Encounter: Payer: Self-pay | Admitting: Internal Medicine

## 2022-05-22 ENCOUNTER — Ambulatory Visit (INDEPENDENT_AMBULATORY_CARE_PROVIDER_SITE_OTHER): Payer: Medicare Other | Admitting: Internal Medicine

## 2022-05-22 VITALS — BP 130/70 | HR 65 | Temp 98.3°F | Ht 63.0 in | Wt 179.4 lb

## 2022-05-22 DIAGNOSIS — J449 Chronic obstructive pulmonary disease, unspecified: Secondary | ICD-10-CM | POA: Diagnosis not present

## 2022-05-22 DIAGNOSIS — Z87898 Personal history of other specified conditions: Secondary | ICD-10-CM | POA: Diagnosis not present

## 2022-05-22 DIAGNOSIS — J441 Chronic obstructive pulmonary disease with (acute) exacerbation: Secondary | ICD-10-CM | POA: Diagnosis not present

## 2022-05-22 MED ORDER — PREDNISONE 10 MG PO TABS: ORAL_TABLET | ORAL | 0 refills | Status: AC

## 2022-05-22 NOTE — Patient Instructions (Addendum)
ICD-10-CM   1. COPD with acute exacerbation ([place])  J44.1     2. COPD, severe ([place])  J44.9     3. COPD, frequent exacerbations ([place])  J44.1     4. History of hemoptysis  Z87.898       Improving but still symptomatic from recent flare upg  Plan - use 2.5L o2 at night and daytime continuous --Continue [employer] portable system that you fill from tank outside in patio - continue nebulizer for copd as before - continue singulair - do prednisone to help flare up resolve  - Please take prednisone 40 mg x1 day, then 30 mg x1 day, then 20 mg x1 day, then 10 mg x1 day, and then 5 mg x1 day and stop - do CT chest without contrast in 2-4 weeks  Follow-up - 2-4 weeks with  DR [name] or APP to discuss CT results

## 2022-05-22 NOTE — Progress Notes (Signed)
OV 05/19/2016  Chief Complaint  Patient presents with   Follow-up    Pt c/o worsening SOB, prod cough with thick white mucus.      This is a routine follow-up. Last visit was in April 2017 with nurse practitioner. At that time treated for COPD exacerbation according to her history but review of the chart does not show that to be true. At this point in time she says COPD stable although she says she might be in flare up on account of her fibromyalgia. Starting her symptoms out it appears that it is generally stable with dyspnea at baseline and cough with mild sputum at baseline. She is more hobbled by her chronic pain and fibromyalgia. Couple months ago apparently a hydrocodone was discontinued by rheumatology and therefore she is having "withdrawal". She does have a new pain medication physician. She is on gabapentin for fibromyalgia. There are no other new issues. She does not want antibiotics or prednisone for her perceived exacerbation  OV 11/17/2016  Chief Complaint  Patient presents with   Follow-up    Pt states her SOB has worsened since last OV. Pt states she has a burning in her chest when she becomes SOB. Pt c/o prod cough with white mucus in morning, cough becomes nonprod throughout the day.     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 Last CT chest March 2017 without any mass and associated mild cor pulmonale   Six-month follow-up visit. She is completely overwhelmed by her fibromyalgia and depression. The fibromyalgia is worse. She says she is change doctors because of this. She's had a few admissions in the interim but none of them give a COPD exacerbation according to chart review. She suffered frustrated by her heels oxygen system even though it is light it is causing her pain. She uses Atrovent nebulizer but wants change to something else but at the same time has rejected use of any other  nebulizer or oral inhaler because of side effects. She is burning chest pain with inspiration and associated with her costochondral junction trigger points. 2014 review of the chart shows normal cardiac stress test. November 2017 chest x-ray is clear. She does not want any further imaging. Chest pain is mild to severe and variable. Worsened with inspiration. No radiation associated wheezing. No sputum production  OV 05/25/2017  Chief Complaint  Patient presents with   Follow-up    Pt states her SOB has worsened since last OV in 11/2016. Pt states she only coughs after her neb treatment - pt states her mucus is yellow in color and c/o occ chest discomfort. Pt denies f/c/s.     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 Last CT chest March 2017 without any mass and associated mild cor pulmonale   Follow-up exertional hypoxemia associated with his emphysema. Also associated with fibromyalgia. Last visit she had atypical chest pain. Recommended she see cardiology. 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 According to her history she has significant coronary artery disease but is fairly advanced. She is frustrated with this but realistic. She feels her days are number. In terms of COPD emphysema and is stable. She is on Atrovent inhalers. She is on oxygen. She wants a lighter system. She still burden by fibromyalgia. 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  She uses oxygen and nebulizers.  She is not interested in rehab or vaccines.  New issue: Preoperative pulmonary evaluation.  She is having significant bilateral lower extremity claudication.  She states that she is in severe pain walking from her door to the mailbox to the point she is almost crying.  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  Apparently Dr. Trula Slade wants to try a laparotomy approach.  However Dr. Broadus John might take her to the cardiac Cath Lab and placed stents.  She says she is sensitive to fentanyl and is worried about anesthesia complications but the pain is so severe she is willing to take the risk.  She has had previous cardiac catheterization without any problems other than being sensitive to fentanyl.  She says this can be done in the cardiac Cath Lab with anesthesia support.  She wants me to talk to Dr. Betsy Coder and Dr. Trula Slade about this.   OV 07/18/2018  Subjective:  Patient ID: [name], female , DOB: 1946-08-01 , age 76 y.o. , MRN: [id number] , ADDRESS: [address] [place] [address]   07/18/2018 -   Chief Complaint  Patient presents with   Follow-up    Pt states her chest is hurting her all the time now and states she does not think her neb solutions are working for her anymore now. Pt also states she has had some worsening SOB and is also coughing up white phlegm which is comes out in chunks.     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  She uses oxygen and nebulizers. Last CT chest March 2017 without any mass and associated mild cor pulmonale   HPI LOURETTA TANTILLO 76 y.o. -after last visit she saw  nurse practitioner in June 2019 fora mild respiratory flare. At the time treated with allergy medications antihistamines.She tells me thathe last saw me January 2019 she had an iliac stent place in the left side and after that her effort tolerance is improved but in the last few months she's noticed a decrease in effort tolerance with worsening dyspnea and also increased cough and increased sputum production in volume and also consistency without change in color This no fever or weight loss. She will not have a flu shot because of prior allergy. Her last CT scan of the chest was in 2017 and she is requesting for another one. Her inhaler as ipratropium which she says she's not happy with. In the past she's uses Spiriva and Symbicort and these have caused blisters and so she is generally where you have inhalers although she wants something other than her current one.       08/19/2018  - Visit   [name].  Patient was last seen in our office  visit on 07/18/2018 started on Bevespi.  Patient reports that after 1 day of use of the Bevespi inhaler she developed thrush and mouth sores.  She she contacted our office to be treated with nystatin.  Patient reports that most mouth sores resolved there remains one.  Patient is also having extensive dental work done.  Patient also has been treated for scabies as well as impetigo by dermatology recently.   Patient completed a high-res CT in 07/29/2018 that showed no real changes from baseline.  Still showing severe emphysema.   Patient reports that she continues to use her Atrovent nebulizer but is wondering if there is any other options available for her.  Patient feels that the Atrovent nebulizer is not working as well.  Patient is currently using as needed and not using it scheduled.  MMRC - Breathlessness Score 3 - I stop for breath after walking about 100 yards or after a few minutes on level ground (isle at grocery  store is 163f)  Patient reports that she has known triggers of shortness of breath with exertion, when there is high pollen counts, when she is walking, when she is outside for extended periods of time.  Patient reports she is been using 2 L via nasal cannula of oxygen with exertion as well as at rest.  Patient reports she forgot her POC at home.  She arrived to our office on room air.  Oxygen saturations 87.  Patient refused oxygen in our office and states that she does not think she needs oxygen right now.  Patient is sad and concerned regarding her limitations with vascular surgery.  Patient believes that she needs a surgery but reports that vascular team as well as cardiology does not think that she would be a good surgical candidate.  She reports that she has not heard back from Dr. BNaida Sleightoffice regarding her most recent test results.   Tests:  01/21/2016-CT chest without contrast- moderate centrilobular emphysema and diffuse bronchial wall thickening mild subpleural density in the dependent lower lobes  01/20/2016-pulmonary function test- airway obstruction and diffusion defect suggesting emphysema  Imaging:  02/11/2017-chest x-ray-stable large cardiac silhouette, lungs are hyperinflated, interstitial edema pattern unchanged from prior scans   Cardiac:  07/08/2017-echocardiogram-LV ejection fraction 50 to 585% grade 1 diastolic dysfunction      OV 12/21/2018  Subjective:  Patient ID: KArmen Pickup female , DOB: 208/12/1945, age 76y.o. 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  She uses oxygen and nebulizers. Last CT chest March 2017 without any mass and associated mild cor pulmonale  12/21/2018 -   Chief Complaint  Patient presents with   Follow-up    Pt states due to being switched to a  new medication, she has had labored breathing. SOB is with exertion, has an occ cough with white phlegm, and also has had some occ CP.     HPI KJAYONA [id number].o. -  Presents for routine follow-up.  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  She uses oxygen sporadically and nebulizer sporadically.  She is very afraid of medicines because of multiple allergies.  She says her psychiatrist Dr. AMelissa Montaneplaced her on some medications that caused a rash.  Other than that she is okay.  OV 09/15/2019  Subjective:  Patient ID: [name], female , DOB: [birth year] , age 83 y.o. , MRN: [id number] , ADDRESS: [address] 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  She uses oxygen and nebulizers. Last CT chest March 2017 without any mass and associated mild cor pulmonale   09/15/2019 -  No chief complaint on file.    HPI JUBILEE VIVERO 76 y.o. -     ROS - per HPI     OV 01/17/2020  Subjective:  Patient ID: [name], female , DOB: 1946-02-18 , age 57 y.o. , MRN: [id number] , ADDRESS: [address] [place] [address]   01/17/2020 -   Chief Complaint  Patient presents with   Follow-up     HPI BELISA EICHHOLZ 76 y.o. -presents for face-to-face follow-up.  In December she called with a COPD flareup symptoms.  She wanted Biaxin despite her allergies.  She feels that Biaxin helps her.  She says she was given prednisone which helped but she was only given generic clarithromycin instead of the tradename Biaxin.  She says it did not work.  She wants tradename Biaxin only.  She says since then her cough is more than baseline.  She also is like sputum that is discolored.  She feels like she is in exacerbation and this is preventing her from getting the COVID-19  vaccine.  She feels another course of tradename Biaxin is required.  She again does not want generic clarithromycin.  She is compliant with her baseline nebulizer nighttime oxygen.  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  She is now recovering from that.  She talked about Covid vaccine.  She wants to get it.  He has had pneumonia vaccine without problem but recently flu shot she feels this put her in the hospital.  She also has multiple oral drug allergies.  Overall she continues to mask and follow social distancing.       CAT COPD Symptom & Quality of Life Score (GSK trademark) 0 is no burden. 5 is highest burden 07/18/2018  12/21/2018  01/17/2020   Never Cough -> Cough all the time _0 No phlegm in chest -> Chest is full of phlegm _1 No chest tightness -> Chest feels very tight _2 No dyspnea for 1 flight stairs/hill -> Very dyspneic for 1 flight of stairs _3 No limitations for ADL at home -> Very limited with ADL at home _4 Confident leaving home -> Not at all confident leaving home 0 1 2  Sleep soundly -> Do not sleep soundly because of lung condition _5 Lots of Energy -> No energy at all _6 TOTAL Score (max 40)  _7 No flowsheet data found.    12/19/2020 -   Chief Complaint  Patient presents with   Follow-up    Had Covid PNA 10/2020, doing some better     HPI BRIA SPARR 76 y.o. -presents for follow-up of her COPD.  She continues to use Atrovent nebulizer.  She prefers nebulizers of inhaler.  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  She uses oxygen at night with exertion.  She tells me that in  December 2021 around Christmas she had respiratory viral symptoms.  She says rapid antigen test by her daughter was negative.  Then in January 2022 she followed up with primary care physician who did a chest x-ray that showed pneumonia.  I have the chest x-ray with me  visualized it.  There is an infiltrate.  She says she was told that she had Covid but there is no confirmatory evidence for this.  She treated herself at home.  She cannot have vaccines because of multiple drug allergies according to history.  I supported her in the decision making.  She is worried about abnormal chest x-ray.        CAT Score 12/19/2020  Total CAT Score 18       OV 01/30/2021  Subjective:  Patient ID: [name], female , DOB: [birth year] , age 6 y.o. , MRN: [id number] , ADDRESS: [address] [place] [address] PCP [name] Agreste, MD Patient Care Team: [name] Agreste, MD as PCP - General (Family Medicine) Lorretta Harp, MD as PCP - Cardiology (Cardiology) Margaretha Sheffield, MD as Referring Physician (Physical Medicine and Rehabilitation) Laurence Spates, MD (Inactive) as Consulting Physician (Gastroenterology) Garvin Fila, MD as Consulting Physician (Neurology) Martin[place], Peter M, MD as Consulting Physician (Cardiology) Brand Males, MD as Consulting Physician (Pulmonary Disease) Adele Schilder Arlyce Harman, MD as Consulting Physician (Psychiatry) Lorretta Harp, MD as Consulting Physician (Peripheral Vascular Disease)  This Provider for this visit: Treatment Team:  Attending Provider: Brand Males, MD    01/30/2021 -   Chief Complaint  Patient presents with   Follow-up    Doing ok, breathing is the same     HPI [name] 76 y.o. -presents for follow-up for COPD.  Here to review the results.  Her daughter Ineze Serrao is on the phone.  No new interim complaints.  A Covid IgG is negative thus making her recent viral infection is unlikely as Covid.  I offered a referral for Covid monoclonal antibody prophylaxis but she declined.  Her pulmonary function test shows 12% decline in FEV1 in 5 years.  I reviewed the chart and do not find an alpha-1 check.  I presume this was done prior to [place] using electronic medical records.  She  is open to getting it tested again.  She is to go since 2 COPD.  She tells me that the DME company is out of supply with tubing for her nebulizer.  She has used an inhaler but she is somewhat skeptical because of nasal septal perforation.  Explained to her inhalers oral.  She then talked about dentures.  Explained that we can do inhalers through an [facility].  She had high-resolution CT chest.  This shows emphysema.  There is evidence of cor pulmonale.  There is stable thoracic aorta aneurysm.  No fibrosis no cancer.   CAT Score 01/30/2021 12/19/2020  Total CAT Score 10 18        Ref Range & Units 1 mo ago  SARS COV1 AB(IGG)SPIKE,SEMI QN <1.00 index <1.00         IMPRESSION: 1. No evidence of interstitial lung disease. Air trapping is indicative of small airways disease. 2. Ascending aortic aneurysm, stable. Recommend annual imaging followup by CTA or MRA. This recommendation follows 2010 ACCF/AHA/AATS/ACR/ASA/SCA/SCAI/SIR/STS/SVM Guidelines for the Diagnosis and Management of Patients with Thoracic Aortic Disease. Circulation. 2010; 121: W299-B716. Aortic aneurysm NOS (ICD10-I71.9). 3. Aortic atherosclerosis (ICD10-I70.0). Coronary artery calcification. 4. Enlarged pulmonic trunk, indicative of  pulmonary arterial hypertension. 5.  Emphysema (ICD10-J43.9).     Electronically Signed   By: Lorin Picket M.D.   On: 01/15/2021 11:52     OV 07/22/2021  Subjective:  Patient ID: [name], female , DOB: 20-Sep-1946 , age 23 y.o. , MRN: [id number] , ADDRESS: [address] [place] [address] PCP [name] Agreste, MD Patient Care Team: [name] Agreste, MD as PCP - General (Family Medicine) [place], Peter M, MD as PCP - Cardiology (Cardiology) Margaretha Sheffield, MD as Referring Physician (Physical Medicine and Rehabilitation) Laurence Spates, MD (Inactive) as Consulting Physician (Gastroenterology) Garvin Fila, MD as Consulting Physician  (Neurology) [place], Peter M, MD as Consulting Physician (Cardiology) Brand Males, MD as Consulting Physician (Pulmonary Disease) Adele Schilder Arlyce Harman, MD as Consulting Physician (Psychiatry)  This Provider for this visit: Treatment Team:  Attending Provider: Brand Males, MD    07/22/2021 -   Chief Complaint  Patient presents with   Follow-up    Pt states she had pneumonia since last visit. States she went to see PCP after finishing meds and was told she still had infection and was placed back on abx by PCP.   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  She uses oxygen and nebulizer  HPI LAWAN NANEZ 76 y.o. -returns for follow-up.  On 06/18/2021 she called with COPD exacerbation symptoms.  Gave her Biaxin and prednisone.  But she tells me that because the line was busy our office did not call in the medications till 5 days later when they got hold of her.  She says she is only partially improved and she is her primary care physician.  She had chest x-ray 07/16/2021.  This reports of interstitial prominence.  However there is no mass or consolidation.  She had basic labs that I reviewed and it is normal.  Apparently primary care physician gave her another Biaxin.  But there is no prednisone.  She still has yellow symptoms.  Symptoms only partially resolved.  She feels another round of prednisone will help her.  Of note we put her on triple inhaler BREZTRI last visit but this gave her thrush.  She is taking ipratropium.  If she only takes it 3 times a day when she has tremors that she is managing just with 2 times a day and this is insufficient.  She is using oxygen continuous now up to 2-1/2 L.  She is now moved downstairs because of her shortness of breath.  She is wanting oxygen concentrator upstairs and downstairs.  In the last few to several years she is refused flu shot.   She will not have the COVID-vaccine either.      OV 08/21/2021  Subjective:  Patient ID: [name], female , DOB: October 11, 1946 , age 43 y.o. , MRN: [id number] , ADDRESS: [address], [place] [address] PCP [name] Agreste, MD Patient Care Team: [name] Agreste, MD as PCP - General (Family Medicine) [place], Peter M, MD as PCP - Cardiology (Cardiology) Margaretha Sheffield, MD as Referring Physician (Physical Medicine and Rehabilitation) Laurence Spates, MD (Inactive) as Consulting Physician (Gastroenterology) Garvin Fila, MD as Consulting Physician (Neurology) [place], Peter M, MD as Consulting Physician (Cardiology) Brand Males, MD as Consulting Physician (Pulmonary Disease) Adele Schilder Arlyce Harman, MD as Consulting Physician (Psychiatry)  This Provider for this visit: Treatment Team:  Attending Provider: Brand Males, MD  Follow-up  emphysema with chronic hypoxemic restorative failure and associated fibromyalgia and associated chronic systolic heart failure: Overall doing well.  She uses oxygen and nebulizer  08/21/2021 -   Chief Complaint  Patient presents with   Follow-up    Pt was at the ED 10/17 after PCP sent her there. States that she has been using Yupelri neb sol. States that it does make her feel dizzy but has helped with her breathing better than prior neb sol.     HPI KELLIS TOPETE 76 y.o. -returns for follow-up to have a follow-up chest x-ray because of his concerns of interstitial edema versus atypical pneumonia at last visit.  I look to the follow-up chest x-ray.  Personal visualization shows it is unchanged.  I think this is a baseline based on the fact his CT scan of the chest in March 2022 look very similar.  She is feeling stable.  Also last visit we put her on Yupelri nebulizer.  Part of this follow-up was to see how she is doing with that.  She says it makes her dizzy but then after a while she is able to tolerate it.  So she is careful  and some days she does not use it.  But overall she feels its more beneficial than ipratropium.  Therefore she wants to stick with it.  Of note on 08/18/2021 she did have a video visit with her psychiatrist for depression.  She also has had a skin biopsy in her left thigh and apparently this was infected and she is sent to the ED for IV antibiotics but there was 11-hour wait and she left AMA without being seen.  She is very upset about her experience.  She is reflecting on it and is wondering whether she should go to the medical board and file a formal complaint.  But overall from a respiratory standpoint she is stable.    The other issues that she wants to oxygen concentrator's.  1 5 stairs and 1 for downstairs.  Last visit I thought I put an order in but she says at that never got it.  She feels service at [facility] is terrible.  I informed her that we will do the order again.          OV 11/13/2021  Subjective:  Patient ID: [name], female , DOB: 04-14-1946 , age 73 y.o. , MRN: [id number] , ADDRESS: [address] [place] [address] PCP Merrilee Seashore, MD Patient Care Team: Merrilee Seashore, MD as PCP - General (Internal Medicine) [place], Peter M, MD as PCP - Cardiology (Cardiology) Margaretha Sheffield, MD as Referring Physician (Physical Medicine and Rehabilitation) Laurence Spates, MD (Inactive) as Consulting Physician (Gastroenterology) Garvin Fila, MD as Consulting Physician (Neurology) [place], Peter M, MD as Consulting Physician (Cardiology) Brand Males, MD as Consulting Physician (Pulmonary Disease) Adele Schilder Arlyce Harman, MD as Consulting Physician (Psychiatry)  This Provider for this visit: Treatment Team:  Attending Provider: Brand Males, MD    11/13/2021 -   Chief Complaint  Patient presents with   Follow-up    Pt states her breathing has become a little worse since last visit. States she has had a virus for the past 10 days.    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  She uses oxygen and nebulizer  HPI SONOMA FIRKUS 75 y.o. -returns for follow-up.  Is a 76-monthroutine follow-up.  She tells  me that some of her neighbors got sick with the flu.  She had flulike symptoms which she is flu negative.  She was in bed for a week.  She felt it was mild.  She did not call us.  She is back to baseline now.  Overall she is stable.  She tells me that her biggest issue is the portable oxygen.  She says Medicare refused to give her 2 systems 1 5 stairs and 1 for downstairs.  Currently she has a Haematologist inside the house that she plugs and uses it at night.  For exertion she uses a Helio system.  She feels that he does system using 100 pound.  Oxygen gallon tank that is outside on the patio.  She wants to bring this inside and then the DME company will run her house for upstairs and downstairs.  She says when she goes into the system the DME company says she cannot revert back to her concentrator.  She is a little bit nervous about this approach with the biggest hold-up is that the DME company is asking for credit card information and she is worried about identity theft.  And price gouging   OV 05/22/2022  Subjective:  Patient ID: [name], female , DOB: [birth year] , age 76 y.o. , MRN: [id number] , ADDRESS: [place] [id number] PCP Merrilee Seashore, MD Patient Care Team: Merrilee Seashore, MD as PCP - General (Internal Medicine) Martinique, Peter M, MD as PCP - Cardiology (Cardiology) Margaretha Sheffield, MD as Referring Physician (Physical Medicine and Rehabilitation) Laurence Spates, MD (Inactive) as Consulting Physician (Gastroenterology) Garvin Fila, MD as Consulting Physician (Neurology) Martinique, Peter M, MD as Consulting Physician (Cardiology) Brand Males, MD as Consulting Physician (Pulmonary Disease) Adele Schilder Arlyce Harman, MD as Consulting Physician (Psychiatry)  This Provider for this visit: Treatment Team:  Attending Provider: Brand Males, MD  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  She uses oxygen and nebulizer . Last CT chest MArch 2022  05/22/2022 -   Chief Complaint  Patient presents with   Follow-up    Follow-up SOB, Coughing, wheezing     HPI MONIK LINS 76 y.o. -returns for follow-up.  This is a routine follow-up but she tells me that since early June 2023 she has been sick.  Apparently she finished grocery shopping and then came home had chills diarrhea and cough shortly after that with a severe cough she had hemoptysis that started and resolved a week later but she was in bed for 21 days.  She was increased use of nebulizer.  She believes it was a respiratory virus.  Apparently repeated COVID test was negative.  She was so ill that her daughters who live an hour and a half however dropping off food for her.  She lives alone.  Currently she is better but still having wheezing.  She is still using nebulizer twice daily.  She is also got postnasal drip and she is needing Benadryl but the hemoptysis resolved.  She is willing to take prednisone.  She continues oxygen and her regular nebulizer and Singulair.  Last CT scan of the chest was over a year ago.  She is willing to get this repeated.    CT Chest data  No results found.    PFT     Latest Ref Rng & Units 01/30/2021    8:51 AM 01/20/2016  9:35 AM 08/14/2013   10:17 AM  PFT Results  FVC-Pre L 2.61  2.79    FVC-Predicted Pre % 92  93  100   FVC-Post L   3.07   FVC-Predicted Post %   103   Pre FEV1/FVC % % 63  67  62   Post FEV1/FCV % %   64   FEV1-Pre L 1.63  1.86  1.84   FEV1-Predicted Pre % 77  82  82   FEV1-Post L   1.97   DLCO  uncorrected ml/min/mmHg 9.45  9.75  11.57   DLCO UNC% % 49  40  50   DLCO corrected ml/min/mmHg 9.45     DLCO COR %Predicted % 49     DLVA Predicted % 57  51  58   TLC L 4.59  4.62  4.32   TLC % Predicted % 90  91  88   RV % Predicted % 80  78  77        has a past medical history of Acute diastolic heart failure, NYHA class 1 ([place]) (02/08/2017), Acute heart failure ([place]) (02/08/2017), Cataract, CHF (congestive heart failure) ([place]) (90/24/0973), Complication of anesthesia, COPD (chronic obstructive pulmonary disease) ([place]), Depression, Depression, Diverticulitis, DVT (deep venous thrombosis) ([facility]), Fibromyalgia, GERD (gastroesophageal reflux disease), Heart murmur, Hiatal hernia, History of deviated nasal septum, HTN (hypertension), Hyperlipidemia, Hypertension, Hypertensive retinopathy, Myocardial infarction ([place]), Obesity, On supplemental oxygen therapy, Osteoporosis, Osteoporosis, Oxygen deficiency, PAT (paroxysmal atrial tachycardia) ([place]), PFO (patent foramen ovale), PULMONARY NODULE, LEFT LOWER LOBE (10/14/2009), PVD (peripheral vascular disease) with claudication ([place]) (12/2017), Right middle lobe pneumonia (07/24/2011), Stroke ([place]), and TOBACCO ABUSE (06/04/2009).   reports that she quit smoking about 11 years ago. Her smoking use included cigarettes. She has a 90.00 pack-year smoking history. She has never used smokeless tobacco.  Past Surgical History:  Procedure Laterality Date   ABDOMINAL HYSTERECTOMY N/A    Phreesia 11/04/2020   CARDIOVASCULAR STRESS TEST  12/26/2004   EF 74%. NO EVIDENCE OF ISCHEMIA   CATARACT EXTRACTION Left    Dr. Elliot Dally   ESOPHAGOGASTRODUODENOSCOPY (EGD) WITH PROPOFOL N/A 04/15/2015   Procedure: ESOPHAGOGASTRODUODENOSCOPY (EGD) WITH PROPOFOL;  Surgeon: Laurence Spates, MD;  Location: [facility];  Service: Endoscopy;  Laterality: N/A;   EYE SURGERY Left    Cat Sx   JOINT REPLACEMENT N/A    Phreesia 11/04/2020   KNEE ARTHROSCOPY  2000   left   LAPAROSCOPIC  CHOLECYSTECTOMY  04-16-2010   [facility]   LOWER EXTREMITY ANGIOGRAPHY N/A 09/09/2017   Procedure: Lower Extremity Angiography;  Surgeon: Lorretta Harp, MD;  Location: [facility];  Service: Cardiovascular;  Laterality: N/A;   LOWER EXTREMITY INTERVENTION Left 01/17/2018   Procedure: LOWER EXTREMITY INTERVENTION;  Surgeon: Lorretta Harp, MD;  Location: [facility];  Service: Cardiovascular;  Laterality: Left;   MOUTH SURGERY     03-26-15 multiple extractions stitches remains   PERIPHERAL VASCULAR INTERVENTION Left 01/17/2018   Procedure: PERIPHERAL VASCULAR INTERVENTION;  Surgeon: Lorretta Harp, MD;  Location: [facility];  Service: Cardiovascular;  Laterality: Left;  COMMON ILIAC   RIGHT/LEFT HEART CATH AND CORONARY ANGIOGRAPHY N/A 03/04/2017   Procedure: Right/Left Heart Cath and Coronary Angiography;  Surgeon: Peter M Martinique, MD;  Location: [facility];  Service: Cardiovascular;  Laterality: N/A;   TOTAL ABDOMINAL HYSTERECTOMY     post op needed oxygen was told "she gave them a scare"   TUBAL LIGATION  US ECHOCARDIOGRAPHY  11/20/2009   EF 55-60%    Allergies  Allergen Reactions   Alprazolam Anaphylaxis and Other (See Comments)    REACTION: stops breathing   Bee Venom Anaphylaxis   Iodine Anaphylaxis, Swelling and Other (See Comments)    REACTION: swelling in throat   Pseudoephedrine Hcl Er Shortness Of Breath   Budesonide-Formoterol Fumarate Other (See Comments)    Blisters inside of mouth all over   Crestor [Rosuvastatin Calcium] Other (See Comments)    Unable to walk   Esomeprazole Magnesium Other (See Comments)    REACTION: "bouncing off walls"   Flonase [Fluticasone Propionate] Other (See Comments)    NOSE BLEED   Lamictal [Lamotrigine] Rash    Patient got rash, labored breathing, and diarrhea   Loratadine Other (See Comments)    claritin D causes shaking   Lotrimin [Clotrimazole] Other (See Comments)    Mouth blisters   Lunesta  [Eszopiclone] Other (See Comments)    REACTION: "slept for a week"   Oxcarbazepine Other (See Comments)    Causes deep sleep and dizziness   Statins Other (See Comments)    Can't walk, legs won't work    Zolpidem Tartrate Other (See Comments)    REACTION: "slept for a week"   Betadine [Povidone Iodine] Other (See Comments)    Breathing problems   Bevespi Aerosphere [Glycopyrrolate-Formoterol] Other (See Comments)    Pt believes this caused mouth sores and thrush    Breztri Aerosphere [Budeson-Glycopyrrol-Formoterol] Other (See Comments)    Thrush   Clarithromycin Other (See Comments)    All "mycins", Puts into "a" fib, Will take if has to for severe sinus infection   Effexor [Venlafaxine] Nausea And Vomiting and Other (See Comments)    cramps   Lexapro [Escitalopram Oxalate] Other (See Comments)    hallucinations   Aciphex [Rabeprazole Sodium] Rash   Alendronate Sodium Other (See Comments)    "caused stomach problems for 3 days"   Avelox [Moxifloxacin Hcl In Nacl] Other (See Comments)    Stomach cramps.    Bextra [Valdecoxib] Rash   Ceclor [Cefaclor] Rash   Cephalexin Rash and Other (See Comments)    Pt states that she is possibly allergic to this - had a reaction to Cefaclor in the past and she does not want to these class drugs. Added per patient request.   Covera-Hs [Verapamil Hcl] Palpitations   Dicyclomine Hcl Rash   Other Other (See Comments)    Glue from ekg/heart monitor leads --rash, Any MYCINS   Tessalon Perles Rash    Immunization History  Administered Date(s) Administered   Influenza Split 08/03/2011, 08/01/2012   Influenza,inj,Quad PF,6+ Mos 08/14/2013, 09/03/2014, 09/04/2015, 08/18/2016   Influenza-Unspecified 11/03/1999   Pneumococcal Conjugate-13 10/01/2014   Pneumococcal Polysaccharide-23 09/03/1999, 06/02/2006, 08/14/2013   Td 11/02/1994   Tdap 08/18/2016    Family History  Problem Relation Age of Onset   Dementia Mother    Diabetes Mother     Alzheimer's disease Mother    Heart attack Brother 55   Heart attack Father    Schizophrenia Sister    Diabetes Sister    Tremor Sister      Current Outpatient Medications:    carvedilol (COREG) 12.5 MG tablet, Take 1 tablet (12.5 mg total) by mouth 2 (two) times daily., Disp: 180 tablet, Rfl: 3   clopidogrel (PLAVIX) 75 MG tablet, TAKE 1 TABLET BY MOUTH EVERY DAY, Disp: 30 tablet, Rfl: 11   desvenlafaxine (PRISTIQ) 50 MG 24 hr tablet, Take 1 tablet (  50 mg total) by mouth daily., Disp: 30 tablet, Rfl: 2   diazepam (VALIUM) 2 MG tablet, Take by mouth., Disp: , Rfl:    diclofenac sodium (VOLTAREN) 1 % GEL, Apply 2 g topically 4 (four) times daily., Disp: , Rfl: 5   Eyelid Cleansers (OCUSOFT BABY EYELID & EYELASH EX), Apply topically., Disp: , Rfl:    furosemide (LASIX) 40 MG tablet, TAKE 1 TABLET BY MOUTH EVERY DAY (Patient taking differently: Take 40 mg by mouth daily.), Disp: 30 tablet, Rfl: 11   gabapentin (NEURONTIN) 100 MG capsule, Take 200 mg by mouth See admin instructions. Take 1 in am and 1 in the lunch and 2 at bedtime, Disp: , Rfl:    HYDROcodone-acetaminophen (NORCO/VICODIN) 5-325 MG tablet, Take 1 tablet by mouth 3 (three) times daily., Disp: , Rfl:    ipratropium (ATROVENT) 0.02 % nebulizer solution, USE 1 VIAL BY NEBULIZATION 4 (FOUR) TIMES DAILY., Disp: 62.5 mL, Rfl: 3   lidocaine (LIDODERM) 5 %, Place 1-3 patches onto the skin as directed., Disp: , Rfl:    montelukast (SINGULAIR) 10 MG tablet, TAKE 1 TABLET BY MOUTH EVERYDAY AT BEDTIME (Patient taking differently: Take 10 mg by mouth daily.), Disp: 90 tablet, Rfl: 2   Multiple Vitamins-Minerals (VITAMIN D3 COMPLETE PO), Take by mouth., Disp: , Rfl:    nitroGLYCERIN (NITROSTAT) 0.4 MG SL tablet, PLACE 1 TABLET UNDER THE TONGUE EVERY 5 MINUTES AS NEEDED FOR CHEST PAIN (Patient taking differently: Place 0.4 mg under the tongue every 5 (five) minutes as needed for chest pain.), Disp: 25 tablet, Rfl: 3   Omega-3 Fatty Acids (FISH  OIL) 1000 MG CAPS, Take 1,000 mg by mouth daily., Disp: , Rfl:    ondansetron (ZOFRAN-ODT) 8 MG disintegrating tablet, Take 1 tablet (8 mg total) by mouth every 8 (eight) hours as needed for nausea or vomiting., Disp: 20 tablet, Rfl: 0   OXYGEN, Inhale 1.5-3 L into the lungs as needed (for shortness of breath)., Disp: , Rfl:    pantoprazole (PROTONIX) 40 MG tablet, TAKE 1 TABLET BY MOUTH EVERY DAY (Patient taking differently: Take 40 mg by mouth daily.), Disp: 30 tablet, Rfl: 5   polyethylene glycol (MIRALAX / GLYCOLAX) packet, Take 17 g by mouth daily as needed for mild constipation. , Disp: , Rfl:    PRALUENT 75 MG/ML SOAJ, INJECT 75 MG INTO THE SKIN EVERY 14 (FOURTEEN) DAYS., Disp: 2 mL, Rfl: 11   spironolactone (ALDACTONE) 25 MG tablet, TAKE 1 TABLET BY MOUTH EVERY DAY, Disp: 30 tablet, Rfl: 11   traZODone (DESYREL) 100 MG tablet, Take one tab daily as needed for sleep, Disp: 30 tablet, Rfl: 2   triamcinolone cream (KENALOG) 0.1 %, Apply 1 application topically as needed (Yeast Infection)., Disp: , Rfl:    warfarin (COUMADIN) 5 MG tablet, TAKE 1/2 TO 1 TABLET BY MOUTH DAILY OR AS DIRECTED BY CLINIC, Disp: 30 tablet, Rfl: 3      Objective:   Vitals:   05/22/22 0847  BP: 130/70  Pulse: 65  Temp: 98.3 F (36.8 C)  TempSrc: Oral  SpO2: 96%  Weight: 179 lb 6.4 oz (81.4 kg)  Height: 5' 3" (1.6 m)    Estimated body mass index is 31.78 kg/m as calculated from the following:   Height as of this encounter: 5' 3" (1.6 m).   Weight as of this encounter: 179 lb 6.4 oz (81.4 kg).  _0 @  [facility]   05/22/22 0847  Weight: 179 lb 6.4 oz (81.4 kg)  Physical Exam  General: No distress. Looks welll Neuro: Alert and Oriented x 3. GCS 15. Speech normal Psych: Pleasant Resp:  Barrel Chest - YES  Wheeze - no, Crackles - no, No overt respiratory distress CVS: Normal heart sounds. Murmurs - no Ext: Stigmata of Connective Tissue Disease - no HEENT: Normal upper airway. PEERL  +. No post nasal drip        Assessment:       ICD-10-CM   1. COPD with acute exacerbation ([place])  J44.1     2. COPD, severe ([place])  J44.9     3. COPD, frequent exacerbations ([place])  J44.1     4. History of hemoptysis  Z87.898          Plan:     Patient Instructions     ICD-10-CM   1. COPD with acute exacerbation ([place])  J44.1     2. COPD, severe ([place])  J44.9     3. COPD, frequent exacerbations ([place])  J44.1     4. History of hemoptysis  Z87.898       Improving but still symptomatic from recent flare upg  Plan - use 2.5L o2 at night and daytime continuous --Continue Helios portable system that you fill from tank outside in patio - continue nebulizer for copd as before - continue singulair - do prednisone to help flare up resolve  - Please take prednisone 40 mg x1 day, then 30 mg x1 day, then 20 mg x1 day, then 10 mg x1 day, and then 5 mg x1 day and stop - do CT chest without contrast in 2-4 weeks  Follow-up - 2-4 weeks with  DR [name] or APP to discuss CT results   SIGNATURE    Dr. Brand Males, M.D., F.C.C.P,  Pulmonary and Critical Care Medicine Staff Physician, [place] Director - Interstitial Lung Disease  Program  [facility] [facility] [place], [place], [address]  Pager: [phone], If no answer or between  15:00h - 7:00h: call [phone] Telephone: [phone]  9:08 AM 05/22/2022

## 2022-05-29 ENCOUNTER — Ambulatory Visit
Admission: RE | Admit: 2022-05-29 | Discharge: 2022-05-29 | Disposition: A | Discharge status: Home / Self Care | Payer: Medicare Other | Source: Ambulatory Visit | Attending: Internal Medicine | Admitting: Internal Medicine

## 2022-05-29 DIAGNOSIS — Z87898 Personal history of other specified conditions: Secondary | ICD-10-CM

## 2022-06-09 ENCOUNTER — Ambulatory Visit (INDEPENDENT_AMBULATORY_CARE_PROVIDER_SITE_OTHER): Payer: Medicare Other | Admitting: Nurse Practitioner

## 2022-06-09 ENCOUNTER — Encounter: Payer: Self-pay | Admitting: Nurse Practitioner

## 2022-06-09 VITALS — BP 142/66 | HR 66 | Temp 98.5°F | Ht 64.0 in | Wt 181.0 lb

## 2022-06-09 DIAGNOSIS — J189 Pneumonia, unspecified organism: Secondary | ICD-10-CM

## 2022-06-09 DIAGNOSIS — J9611 Chronic respiratory failure with hypoxia: Secondary | ICD-10-CM

## 2022-06-09 DIAGNOSIS — J441 Chronic obstructive pulmonary disease with (acute) exacerbation: Secondary | ICD-10-CM

## 2022-06-09 MED ORDER — AMOXICILLIN-POT CLAVULANATE 875-125 MG PO TABS: 1.0000 | ORAL_TABLET | Freq: Two times a day (BID) | ORAL | 0 refills | Status: AC

## 2022-06-09 MED ORDER — PREDNISONE 10 MG PO TABS: ORAL_TABLET | ORAL | 0 refills | Status: DC

## 2022-06-09 NOTE — Progress Notes (Signed)
$'@Patient'Z$  ID: Laurie Horton, female    DOB: 30-Mar-1946, 76 y.o.   MRN: [id number]  Chief Complaint  Patient presents with   Follow-up    Patient says she is short of breath more because of the heat. Her mucus is green. Patient states she also still has a cough.     Referring provider: Merrilee Seashore, MD  HPI: 76 year old female, former smoker followed for COPD and chronic respiratory failure with hypoxia. She is a patient of Dr. Golden Pop and last seen in office 05/22/2022. Past medical history significant for HTN, PFO, carotid stenosis, a fib on coumadin, systolic CHF, GERD, HLD, GAD, MDD, obesity, PVD.   TEST/EVENTS:  01/30/2021 PFTs: FVC 92, FEV1 77, ratio 63, TLC 90, DLCO 49 12/06/2021 echocardiogram: EF 55 to 60%.  Mild LVH.  Diastolic parameters are normal.  LV size and function is normal. 05/29/2022 CT chest without contrast: Atherosclerosis.  No significant change in 4 cm ascending thoracic aortic aneurysm.  There is moderate to severe centrilobular emphysema again noted.  No suspicious nodules or masses identified.  There is a new pulmonary airspace disease in the right middle lobe with air bronchograms, consistent with infectious or inflammatory process.  05/22/2022: OV with Dr. [name]. Intended to be routine follow up but she had been having sick symptoms since June 2023. She had gone grocery shopping and came home with chills, diarrhea, and cough. She also developed hemoptysis, which resolved after a week. Stated she was in bed for 21 days. Doing better at OV but still wheezing. She is also having postnasal drip. 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 Continue supplemental O2. Continue nebulized medications. Continued singulair. Plan for CT chest in 2-4 weeks.   06/09/2022: Today-follow-up Patient presents today for follow-up after being treated for slow to resolve AECOPD related to respiratory virus.  She completed prednisone course and had scheduled CT in  the interim.  CT scan showed a right middle lobe airspace disease, concerning for pneumonia.  She has not had any antibiotics up to this point.  Today, she reports still having some increased shortness of breath from her baseline.  She did feel better when she was on the prednisone.  She also has a productive cough with yellow to green sputum and chest congestion.  She has not had any further episodes of hemoptysis.  She does have an occasional wheeze, which improves with her nebulizer treatments.  She denies any fevers, night sweats, orthopnea, leg swelling, URI symptoms, interim sick exposures.  She is using her DuoNeb 3 times a day.  She states that she is unable to tolerate any and all inhalers as they cause irritation to the back of her throat and mouth.  She takes Singulair at bedtime.  She continues on her supplemental oxygen with activity and at night.  Usually uses 2-3 lpm.   Allergies  Allergen Reactions   Alprazolam Anaphylaxis and Other (See Comments)    REACTION: stops breathing   Bee Venom Anaphylaxis   Iodine Anaphylaxis, Swelling and Other (See Comments)    REACTION: swelling in throat   Pseudoephedrine Hcl Er Shortness Of Breath   Budesonide-Formoterol Fumarate Other (See Comments)    Blisters inside of mouth all over   Crestor [Rosuvastatin Calcium] Other (See Comments)    Unable to walk   Esomeprazole Magnesium Other (See Comments)    REACTION: "bouncing off walls"   Flonase [Fluticasone Propionate] Other (See Comments)    NOSE BLEED   Lamictal [  Lamotrigine] Rash    Patient got rash, labored breathing, and diarrhea   Loratadine Other (See Comments)    claritin D causes shaking   Lotrimin [Clotrimazole] Other (See Comments)    Mouth blisters   Lunesta [Eszopiclone] Other (See Comments)    REACTION: "slept for a week"   Oxcarbazepine Other (See Comments)    Causes deep sleep and dizziness   Statins Other (See Comments)    Can't walk, legs won't work    Zolpidem Tartrate  Other (See Comments)    REACTION: "slept for a week"   Betadine [Povidone Iodine] Other (See Comments)    Breathing problems   Bevespi Aerosphere [Glycopyrrolate-Formoterol] Other (See Comments)    Pt believes this caused mouth sores and thrush    Breztri Aerosphere [Budeson-Glycopyrrol-Formoterol] Other (See Comments)    Thrush   Clarithromycin Other (See Comments)    All "mycins", Puts into "a" fib, Will take if has to for severe sinus infection   Effexor [Venlafaxine] Nausea And Vomiting and Other (See Comments)    cramps   Lexapro [Escitalopram Oxalate] Other (See Comments)    hallucinations   Aciphex [Rabeprazole Sodium] Rash   Alendronate Sodium Other (See Comments)    "caused stomach problems for 3 days"   Avelox [Moxifloxacin Hcl In Nacl] Other (See Comments)    Stomach cramps.    Bextra [Valdecoxib] Rash   Ceclor [Cefaclor] Rash   Cephalexin Rash and Other (See Comments)    Pt states that she is possibly allergic to this - had a reaction to Cefaclor in the past and she does not want to these class drugs. Added per patient request.   Covera-Hs [Verapamil Hcl] Palpitations   Dicyclomine Hcl Rash   Other Other (See Comments)    Glue from ekg/heart monitor leads --rash, Any MYCINS   Tessalon Perles Rash    Immunization History  Administered Date(s) Administered   Influenza Split 08/03/2011, 08/01/2012   Influenza,inj,Quad PF,6+ Mos 08/14/2013, 09/03/2014, 09/04/2015, 08/18/2016   Influenza-Unspecified 11/03/1999   Pneumococcal Conjugate-13 10/01/2014   Pneumococcal Polysaccharide-23 09/03/1999, 06/02/2006, 08/14/2013   Td 11/02/1994   Tdap 08/18/2016    Past Medical History:  Diagnosis Date   Acute diastolic heart failure, NYHA class 1 ([place]) 02/08/2017   Acute heart failure ([place]) 02/08/2017   Cataract    OD   CHF (congestive heart failure) ([place]) 03/02/2017   EF 40-98% 1191   Complication of anesthesia    various issues with oxygen  saturations post op   COPD  (chronic obstructive pulmonary disease) ([place])    Depression    Depression    [employer]reesia 11/04/2020   Diverticulitis    DVT (deep venous thrombosis) (HCC)    Fibromyalgia    GERD (gastroesophageal reflux disease)    Heart murmur    [employer]reesia 11/04/2020   Hiatal hernia    History of deviated nasal septum    left- side   HTN (hypertension)    Hyperlipidemia    Hypertension    [employer]reesia 11/04/2020   Hypertensive retinopathy    OU   Myocardial infarction ([place])    [employer]reesia 11/04/2020   Obesity    On supplemental oxygen therapy    concentrator at night @ 1.5 l/m or when sleeps. O2 Sat niormally 87.   Osteoporosis    Osteoporosis    [employer]reesia 11/04/2020   Oxygen deficiency    [employer]reesia 11/04/2020   PAT (paroxysmal atrial tachycardia) (HCC)    PFO (patent foramen ovale)    PULMONARY NODULE, LEFT  LOWER LOBE 10/14/2009   22m LLL nodule dec 2010. Stable and 474min Oct 2012. No further fu   PVD (peripheral vascular disease) [place]audication [place]   Right middle lobe pneumonia 07/24/2011   First noted at admit 07/10/11. Persists on cxr 07/22/11. Cleared on CT 08/24/11. No further followup   [facility]   TOBACCO ABUSE 06/04/2009    Tobacco History: Social History   Tobacco Use  Smoking Status Former   Packs/day: 3.00   Years: 30.00   Total pack years: 90.00   Types: Cigarettes   Quit date: 06/02/2010   Years since quitting: 12.0  Smokeless Tobacco Never  Tobacco Comments   QUIT IN 2011   Counseling given: Not Answered Tobacco comments: QUIT IN 2011   Outpatient Medications Prior to Visit  Medication Sig Dispense Refill   carvedilol (COREG) 12.5 MG tablet Take 1 tablet (12.5 mg total) by mouth 2 (two) times daily. 180 tablet 3   clopidogrel (PLAVIX) 75 MG tablet TAKE 1 TABLET BY MOUTH EVERY DAY 30 tablet 11   desvenlafaxine (PRISTIQ) 50 MG 24 hr tablet Take 1 tablet (50 mg total) by mouth daily. 30 tablet 2   diazepam (VALIUM) 2 MG tablet Take by mouth.     diclofenac  sodium (VOLTAREN) 1 % GEL Apply 2 g topically 4 (four) times daily.  5   Eyelid Cleansers (OCUSOFT BABY EYELID & EYELASH EX) Apply topically.     furosemide (LASIX) 40 MG tablet TAKE 1 TABLET BY MOUTH EVERY DAY (Patient taking differently: Take 40 mg by mouth daily.) 30 tablet 11   gabapentin (NEURONTIN) 100 MG capsule Take 200 mg by mouth See admin instructions. Take 1 in am and 1 in the lunch and 2 at bedtime     HYDROcodone-acetaminophen (NORCO/VICODIN) 5-325 MG tablet Take 1 tablet by mouth 3 (three) times daily.     ipratropium (ATROVENT) 0.02 % nebulizer solution USE 1 VIAL BY NEBULIZATION 4 (FOUR) TIMES DAILY. 62.5 mL 3   lidocaine (LIDODERM) 5 % Place 1-3 patches onto the skin as directed.     montelukast (SINGULAIR) 10 MG tablet TAKE 1 TABLET BY MOUTH EVERYDAY AT BEDTIME (Patient taking differently: Take 10 mg by mouth daily.) 90 tablet 2   Multiple Vitamins-Minerals (VITAMIN D3 COMPLETE PO) Take by mouth.     nitroGLYCERIN (NITROSTAT) 0.4 MG SL tablet PLACE 1 TABLET UNDER THE TONGUE EVERY 5 MINUTES AS NEEDED FOR CHEST PAIN (Patient taking differently: Place 0.4 mg under the tongue every 5 (five) minutes as needed for chest pain.) 25 tablet 3   Omega-3 Fatty Acids (FISH OIL) 1000 MG CAPS Take 1,000 mg by mouth daily.     ondansetron (ZOFRAN-ODT) 8 MG disintegrating tablet Take 1 tablet (8 mg total) by mouth every 8 (eight) hours as needed for nausea or vomiting. 20 tablet 0   OXYGEN Inhale 1.5-3 L into the lungs as needed (for shortness of breath).     pantoprazole (PROTONIX) 40 MG tablet TAKE 1 TABLET BY MOUTH EVERY DAY (Patient taking differently: Take 40 mg by mouth daily.) 30 tablet 5   polyethylene glycol (MIRALAX / GLYCOLAX) packet Take 17 g by mouth daily as needed for mild constipation.      PRALUENT 75 MG/ML SOAJ INJECT 75 MG INTO THE SKIN EVERY 14 (FOURTEEN) DAYS. 2 mL 11   spironolactone (ALDACTONE) 25 MG tablet TAKE 1 TABLET BY MOUTH EVERY DAY 30 tablet 11   traZODone (DESYREL)  100 MG tablet Take one tab  daily as needed for sleep 30 tablet 2   triamcinolone cream (KENALOG) 0.1 % Apply 1 application topically as needed (Yeast Infection).     warfarin (COUMADIN) 5 MG tablet TAKE 1/2 TO 1 TABLET BY MOUTH DAILY OR AS DIRECTED BY CLINIC 30 tablet 3   No facility-administered medications prior to visit.     Review of Systems:   Constitutional: No weight loss or gain, night sweats, fevers, chills, fatigue, or lassitude. HEENT: No headaches, difficulty swallowing, tooth/dental problems, or sore throat. No sneezing, itching, ear ache, nasal congestion, or post nasal drip CV:  No chest pain, orthopnea, PND, swelling in lower extremities, anasarca, dizziness, palpitations, syncope Resp: +shortness of breath with exertion; productive cough; wheezing. No hemoptysis.  No chest wall deformity GI:  No heartburn, indigestion, abdominal pain, nausea, vomiting, diarrhea, change in bowel habits, loss of appetite, bloody stools.  Skin: No rash, lesions, ulcerations Neuro: No dizziness or lightheadedness.  Psych: No depression or anxiety. Mood stable.     Physical Exam:  BP (!) 142/66 (BP Location: Right Arm, Patient Position: Sitting, Cuff Size: Normal)   Pulse 66   Temp 98.5 F (36.9 C) (Oral)   Ht '5\' 4"'$  (1.626 m)   Wt 181 lb (82.1 kg)   SpO2 94%   BMI 31.07 kg/m   GEN: Pleasant, interactive, chronically-ill appearing; obese; in no acute distress. HEENT:  Normocephalic and atraumatic. PERRLA. Sclera white. Nasal turbinates pink, moist and patent bilaterally. No rhinorrhea present. Oropharynx pink and moist, without exudate or edema. No lesions, ulcerations, or postnasal drip.  NECK:  Supple w/ fair ROM. No JVD present. Normal carotid impulses w/o bruits. Thyroid symmetrical with no goiter or nodules palpated. No lymphadenopathy.   CV: RRR, no m/r/g, no peripheral edema. Pulses intact, +2 bilaterally. No cyanosis, pallor or clubbing. PULMONARY:  Unlabored, regular  breathing. Diminished bilaterally A&P. Rhonchi RLL. No accessory muscle use. No dullness to percussion. GI: BS present and normoactive. Soft, non-tender to palpation.  MSK: No erythema, warmth or tenderness. Cap refil <2 sec all extrem. No deformities or joint swelling noted.  Neuro: A/Ox3. No focal deficits noted.   Skin: Warm, no lesions or rashe Psych: Normal affect and behavior. Judgement and thought content appropriate.     Lab Results:  CBC    Component Value Date/Time   WBC 8.1 01/19/2022 0939   WBC 10.7 (H) 12/05/2021 2132   RBC 3.98 01/19/2022 0939   RBC 4.23 12/05/2021 2132   HGB 12.0 01/19/2022 0939   HCT 36.3 01/19/2022 0939   PLT 254 01/19/2022 0939   MCV 91 01/19/2022 0939   MCH 30.2 01/19/2022 0939   MCH 31.2 12/05/2021 2132   MCHC 33.1 01/19/2022 0939   MCHC 33.5 12/05/2021 2132   RDW 12.4 01/19/2022 0939   LYMPHSABS 1.0 12/05/2021 2132   LYMPHSABS 1.4 04/13/2018 0957   MONOABS 0.9 12/05/2021 2132   EOSABS 0.1 12/05/2021 2132   EOSABS 0.1 04/13/2018 0957   BASOSABS 0.0 12/05/2021 2132   BASOSABS 0.0 04/13/2018 0957    BMET    Component Value Date/Time   NA 135 12/05/2021 2132   NA 136 12/30/2020 1128   K 3.7 12/05/2021 2132   CL 96 (L) 12/05/2021 2132   CO2 31 12/05/2021 2132   GLUCOSE 141 (H) 12/05/2021 2132   BUN 18 12/05/2021 2132   BUN 12 12/30/2020 1128   CREATININE 1.09 (H) 12/05/2021 2132   CREATININE 0.91 08/18/2016 0843   CALCIUM 9.5 12/05/2021 2132   GFRNONAA 53 (  L) 12/05/2021 2132   GFRNONAA 63 07/11/2015 0828   GFRAA 63 05/16/2020 0921   GFRAA 73 07/11/2015 0828    BNP    Component Value Date/Time   BNP 63.0 12/05/2021 2331     Imaging:  CT Chest Wo Contrast  Result Date: 05/29/2022 CLINICAL DATA:  Hemoptysis.  Cough.  Emphysema. EXAM: CT CHEST WITHOUT CONTRAST TECHNIQUE: Multidetector CT imaging of the chest was performed following the standard protocol without IV contrast. RADIATION DOSE REDUCTION: This exam was  performed according to the departmental dose-optimization program which includes automated exposure control, adjustment of the mA and/or kV according to patient size and/or use of iterative reconstruction technique. COMPARISON:  01/15/2021 FINDINGS: Cardiovascular: No acute findings. Aortic atherosclerotic calcification incidentally noted. No significant change in 4.0 cm ascending thoracic aortic aneurysm. Mediastinum/Nodes: No masses or pathologically enlarged lymph nodes identified on this unenhanced exam. Lungs/Pleura: Moderate to severe centrilobular emphysema is again noted. No suspicious nodules or masses identified. New pulmonary airspace disease is seen in the right middle lobe with air bronchograms, consistent with infectious or inflammatory process. No evidence of pleural effusion. Upper Abdomen:  Unremarkable. Musculoskeletal:  No suspicious bone lesions. IMPRESSION: New right middle lobe pulmonary airspace disease, consistent with infectious or inflammatory process. No evidence of mass or lymphadenopathy. Stable 4.0 cm ascending thoracic aortic aneurysm. Recommend annual imaging followup by CTA or MRA. This recommendation follows 2010 ACCF/AHA/AATS/ACR/ASA/SCA/SCAI/SIR/STS/SVM Guidelines for the Diagnosis and Management of Patients with Thoracic Aortic Disease. Circulation. 2010; 121: F681-E751. Aortic aneurysm NOS (ICD10-I71.9) Aortic Atherosclerosis (ICD10-I70.0) and Emphysema (ICD10-J43.9). Electronically Signed   By: Marlaine Hind M.D.   On: 05/29/2022 16:36         Latest Ref Rng & Units 01/30/2021    8:51 AM 01/20/2016    9:35 AM 08/14/2013   10:17 AM  PFT Results  FVC-Pre L 2.61  2.79    FVC-Predicted Pre % 92  93  100   FVC-Post L   3.07   FVC-Predicted Post %   103   Pre FEV1/FVC % % 63  67  62   Post FEV1/FCV % %   64   FEV1-Pre L 1.63  1.86  1.84   FEV1-Predicted Pre % 77  82  82   FEV1-Post L   1.97   DLCO uncorrected ml/min/mmHg 9.45  9.75  11.57   DLCO UNC% % 49  40  50    DLCO corrected ml/min/mmHg 9.45     DLCO COR %Predicted % 49     DLVA Predicted % 57  51  58   TLC L 4.59  4.62  4.32   TLC % Predicted % 90  91  88   RV % Predicted % 80  78  77     No results found for: "NITRICOXIDE"      Assessment & Plan:   CAP (community acquired pneumonia) Viral respiratory infection in June with persistent AECOPD. Treated with prednisone taper in July. CT chest 7/28 revealed a new RML airspace consolidation, concerning for pna given her infectious symptoms. We will treat her with augmentin 7 day course. Advised to use mucinex to assist with mucociliary clearance. Repeat CT chest 6 weeks after completion of abx.  Patient Instructions  Continue duoneb 3 mL every 6 hours as needed for shortness of breath or wheezing. Use twice daily for maintenance  Continue singulair 10 mg At bedtime  Continue supplemental oxygen 2-3 lpm with activity and at night. Goal oxygen >88-90%   Augmentin 1  tab Twice daily for 7 days. Take with food. Take daily over the counter probiotic  Prednisone taper. 4 tabs for 2 days, then 3 tabs for 2 days, 2 tabs for 2 days, then 1 tab for 2 days, then stop. Take in AM with food. Mucinex 600 mg Twice daily for chest congestion/cough  Repeat CT chest 6 weeks after you finish your antibiotic   Follow up in 2 weeks with Dr. [name] or Roxan Diesel, NP. If symptoms do not improve or worsen, please contact office for sooner follow up or seek emergency care.     COPD with acute exacerbation (HCC) Severe COPD with unresolving acute exacerbation related to CAP. See above plan. Rx for extended prednisone taper. Unable to tolerate maintenance inhalers in past. She does well with duonebs so we will continue these. Continue singulair for trigger prevention.   Chronic respiratory failure with hypoxia (HCC) Stable without increased O2 demand. Continue 2-3 lpm with activity and at night. Goal >88-90%.    I spent 35 minutes of dedicated to the care of  this patient on the date of this encounter to include pre-visit review of records, face-to-face time with the patient discussing conditions above, post visit ordering of testing, clinical documentation with the electronic health record, making appropriate referrals as documented, and communicating necessary findings to members of the patients care team.  Clayton Bibles, NP 06/09/2022  Pt aware and understands NP's role.

## 2022-06-09 NOTE — Assessment & Plan Note (Signed)
Viral respiratory infection in June with persistent AECOPD. Treated with prednisone taper in July. CT chest 7/28 revealed a new RML airspace consolidation, concerning for pna given her infectious symptoms. We will treat her with augmentin 7 day course. Advised to use mucinex to assist with mucociliary clearance. Repeat CT chest 6 weeks after completion of abx.  Patient Instructions  Continue duoneb 3 mL every 6 hours as needed for shortness of breath or wheezing. Use twice daily for maintenance  Continue singulair 10 mg At bedtime  Continue supplemental oxygen 2-3 lpm with activity and at night. Goal oxygen >88-90%   Augmentin 1 tab Twice daily for 7 days. Take with food. Take daily over the counter probiotic  Prednisone taper. 4 tabs for 2 days, then 3 tabs for 2 days, 2 tabs for 2 days, then 1 tab for 2 days, then stop. Take in AM with food. Mucinex 600 mg Twice daily for chest congestion/cough  Repeat CT chest 6 weeks after you finish your antibiotic   Follow up in 2 weeks with Dr. [name] or Roxan Diesel, NP. If symptoms do not improve or worsen, please contact office for sooner follow up or seek emergency care.

## 2022-06-09 NOTE — Patient Instructions (Addendum)
Continue duoneb 3 mL every 6 hours as needed for shortness of breath or wheezing. Use twice daily for maintenance  Continue singulair 10 mg At bedtime  Continue supplemental oxygen 2-3 lpm with activity and at night. Goal oxygen >88-90%   Augmentin 1 tab Twice daily for 7 days. Take with food. Take daily over the counter probiotic  Prednisone taper. 4 tabs for 2 days, then 3 tabs for 2 days, 2 tabs for 2 days, then 1 tab for 2 days, then stop. Take in AM with food. Mucinex 600 mg Twice daily for chest congestion/cough  Repeat CT chest 6 weeks after you finish your antibiotic   Follow up in 2 weeks with Dr. [name] or Roxan Diesel, NP. If symptoms do not improve or worsen, please contact office for sooner follow up or seek emergency care.

## 2022-06-09 NOTE — Assessment & Plan Note (Signed)
Stable without increased O2 demand. Continue 2-3 lpm with activity and at night. Goal >88-90%.

## 2022-06-09 NOTE — Assessment & Plan Note (Signed)
Severe COPD with unresolving acute exacerbation related to CAP. See above plan. Rx for extended prednisone taper. Unable to tolerate maintenance inhalers in past. She does well with duonebs so we will continue these. Continue singulair for trigger prevention.

## 2022-06-11 NOTE — Progress Notes (Signed)
Hi all, I already saw her in office and we discussed. Placed her on abx! She has follow up with me 8/22. No need to call her, Raquel Sarna. Thanks!

## 2022-06-11 NOTE — Progress Notes (Signed)
CT 05/29/22 - IMPRESSION: New right middle lobe pulmonary airspace disease, consistent with infectious or inflammatory process. This is compared to Last year 2022.   Plan  - keep OV with Roxan Diesel - needs Fu CT chestin  69mohts from 05/29/22

## 2022-06-12 ENCOUNTER — Other Ambulatory Visit: Payer: Self-pay | Admitting: Nurse Practitioner

## 2022-06-12 DIAGNOSIS — B370 Candidal stomatitis: Secondary | ICD-10-CM

## 2022-06-12 MED ORDER — NYSTATIN 100000 UNIT/ML MT SUSP: 5.0000 mL | Freq: Four times a day (QID) | OROMUCOSAL | 0 refills | Status: AC

## 2022-06-12 NOTE — Telephone Encounter (Signed)
Mychart message sent by pt: [facility] (supporting Marland Kitchen V, NP) 18 hours ago (3:20 PM)    Looking over my paperwork you mentioned "duoneb" and I did not get the inhaler for Albuteral.  Why?  Because it would interact with my medications for depression and oxybutynin.  I take Pristiq. I do not remember any mention of Mucinex in our conversation so I did not buy that.  But, I did get the Probiotics which I started with the Augmentin.  I have developed thrush so I am on a soft diet Will see you on the 22nd   Routing to [place] as an [place].

## 2022-06-12 NOTE — Telephone Encounter (Signed)
Misunderstanding on my part. I thought she had said her nebulizer had albuterol in it as well. She can continue her atrovent nebs as before. Yes, we did discuss mucinex (guaifenesin) and would recommend that she take it Twice daily until her symptoms are improved to help with mucociliary clearance. If she has developed thrush, I will send nystatin rinses for her - four times a day for 10 days. Swish and swallow. Thanks!

## 2022-06-23 ENCOUNTER — Ambulatory Visit (INDEPENDENT_AMBULATORY_CARE_PROVIDER_SITE_OTHER): Payer: Medicare Other | Admitting: Nurse Practitioner

## 2022-06-23 ENCOUNTER — Encounter: Payer: Self-pay | Admitting: Nurse Practitioner

## 2022-06-23 DIAGNOSIS — J449 Chronic obstructive pulmonary disease, unspecified: Secondary | ICD-10-CM | POA: Diagnosis not present

## 2022-06-23 DIAGNOSIS — J9611 Chronic respiratory failure with hypoxia: Secondary | ICD-10-CM

## 2022-06-23 DIAGNOSIS — J189 Pneumonia, unspecified organism: Secondary | ICD-10-CM

## 2022-06-23 MED ORDER — IPRATROPIUM BROMIDE 0.02 % IN SOLN: RESPIRATORY_TRACT | 3 refills | Status: DC

## 2022-06-23 NOTE — Assessment & Plan Note (Signed)
Stable without any increased O2 demand. Continue supplemental oxygen 2-3 lpm for goal >88-90%.

## 2022-06-23 NOTE — Assessment & Plan Note (Signed)
Clinically improved with resolving AECOPD. She is maintained on atrovent nebs; unable to tolerate ICS or SABA. No changes made to current regimen. Encouraged to continue mucociliary clearance therapies. Activity as tolerated.   Patient Instructions  Continue ipratropium 3 mL every 6 hours as needed for shortness of breath or wheezing. Use twice daily for maintenance  Continue singulair 10 mg At bedtime  Continue supplemental oxygen 2-3 lpm with activity and at night. Goal oxygen >88-90%  Continue Mucinex 600 mg Twice daily for chest congestion/cough   Repeat CT chest mid October    Follow up in 8-10 weeks with Dr. [name] or Alanson Aly. If symptoms do not improve or worsen, please contact office for sooner follow up or seek emergency care.

## 2022-06-23 NOTE — Assessment & Plan Note (Signed)
RML airspace disease on CT chest from 7/28 with infectious symptoms. Treated with augmentin course, which she has completed. Clinically improved with resolving symptoms. Advised to monitor for worsening SOB, productive cough or new fevers. Plan to repeat CT chest in 8 weeks.

## 2022-06-23 NOTE — Progress Notes (Signed)
@Patient  ID: Laurie Horton, female    DOB: Sep 27, 1946, 76 y.o.   MRN: [id number]  Chief Complaint  Patient presents with   Follow-up    Follow up. Patient states she has confusion about her lungs.     Referring provider: Georgianne Fick, MD  HPI: 76 year old female, former smoker followed for COPD and chronic respiratory failure with hypoxia. She is a patient of Dr. Jane Canary and last seen in office 06/09/2022 by [facility] NP. Past medical history significant for HTN, PFO, carotid stenosis, a fib on coumadin, systolic CHF, GERD, HLD, GAD, MDD, obesity, PVD.   TEST/EVENTS:  01/30/2021 PFTs: FVC 92, FEV1 77, ratio 63, TLC 90, DLCO 49 12/06/2021 echocardiogram: EF 55 to 60%.  Mild LVH.  Diastolic parameters are normal.  LV size and function is normal. 05/29/2022 CT chest without contrast: Atherosclerosis.  No significant change in 4 cm ascending thoracic aortic aneurysm.  There is moderate to severe centrilobular emphysema again noted.  No suspicious nodules or masses identified.  There is a new pulmonary airspace disease in the right middle lobe with air bronchograms, consistent with infectious or inflammatory process.  05/22/2022: OV with Dr. Marchelle Gearing. Intended to be routine follow up but she had been having sick symptoms since June 2023. She had gone grocery shopping and came home with chills, diarrhea, and cough. She also developed hemoptysis, which resolved after a week. Stated she was in bed for 21 days. Doing better at OV but still wheezing. She is also having postnasal drip. 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 Continue supplemental O2. Continue nebulized medications. Continued singulair. Plan for CT chest in 2-4 weeks.   06/09/2022: OV with Blanche Gallien NP for follow-up after being treated for slow to resolve AECOPD related to respiratory virus.  She completed prednisone course and had scheduled CT in the interim.  CT scan showed a right middle lobe airspace disease, concerning  for pneumonia.  She has not had any antibiotics up to this point.  Today, she reports still having some increased shortness of breath from her baseline.  She did feel better when she was on the prednisone.  She also has a productive cough with yellow to green sputum and chest congestion.  She has not had any further episodes of hemoptysis.  She does have an occasional wheeze, which improves with her nebulizer treatments.  She denies any fevers, night sweats, orthopnea, leg swelling, URI symptoms, interim sick exposures.  She is using her DuoNeb 3 times a day.  She states that she is unable to tolerate any and all inhalers as they cause irritation to the back of her throat and mouth.  She takes Singulair at bedtime.  She continues on her supplemental oxygen with activity and at night.  Usually uses 2-3 lpm.  Viral respiratory infection in June with persistent AECOPD. Treated with prednisone taper in July. CT chest 7/28 revealed a new RML airspace consolidation, concerning for pna given her infectious symptoms. We will treat her with augmentin 7 day course. Advised to use mucinex to assist with mucociliary clearance. Repeat CT chest 6 weeks after completion of abx.  06/23/2022: Today - follow up Patient presents today for follow up after being treated for AECOPD and pneumonia. She reports that she has been feeling much better since she completed her abx and prednisone. Breathing is back to baseline and her cough is clearing up, usually with very pale yellow or clear sputum. She does still feel more fatigued than usually  but feels like she's slowly building her stamina back up. Denies any fevers, chills, night sweats, hemoptysis, chest congestion, orthopnea, leg swelling. She continues to use her ipratropium neb for maintenance 3 times a day. Takes singulair at bedtime. Maintaining oxygen on 2-3 lpm.   Allergies  Allergen Reactions   Alprazolam Anaphylaxis and Other (See Comments)    REACTION: stops breathing    Bee Venom Anaphylaxis   Iodine Anaphylaxis, Swelling and Other (See Comments)    REACTION: swelling in throat   Pseudoephedrine Hcl Er Shortness Of Breath   Budesonide-Formoterol Fumarate Other (See Comments)    Blisters inside of mouth all over   Crestor [Rosuvastatin Calcium] Other (See Comments)    Unable to walk   Esomeprazole Magnesium Other (See Comments)    REACTION: "bouncing off walls"   Flonase [Fluticasone Propionate] Other (See Comments)    NOSE BLEED   Lamictal [Lamotrigine] Rash    Patient got rash, labored breathing, and diarrhea   Loratadine Other (See Comments)    claritin D causes shaking   Lotrimin [Clotrimazole] Other (See Comments)    Mouth blisters   Lunesta [Eszopiclone] Other (See Comments)    REACTION: "slept for a week"   Oxcarbazepine Other (See Comments)    Causes deep sleep and dizziness   Statins Other (See Comments)    Can't walk, legs won't work    Zolpidem Tartrate Other (See Comments)    REACTION: "slept for a week"   Betadine [Povidone Iodine] Other (See Comments)    Breathing problems   Bevespi Aerosphere [Glycopyrrolate-Formoterol] Other (See Comments)    Pt believes this caused mouth sores and thrush    Breztri Aerosphere [Budeson-Glycopyrrol-Formoterol] Other (See Comments)    Thrush   Clarithromycin Other (See Comments)    All "mycins", Puts into "a" fib, Will take if has to for severe sinus infection   Effexor [Venlafaxine] Nausea And Vomiting and Other (See Comments)    cramps   Lexapro [Escitalopram Oxalate] Other (See Comments)    hallucinations   Aciphex [Rabeprazole Sodium] Rash   Alendronate Sodium Other (See Comments)    "caused stomach problems for 3 days"   Avelox [Moxifloxacin Hcl In Nacl] Other (See Comments)    Stomach cramps.    Bextra [Valdecoxib] Rash   Ceclor [Cefaclor] Rash   Cephalexin Rash and Other (See Comments)    Pt states that she is possibly allergic to this - had a reaction to Cefaclor in the past and  she does not want to these class drugs. Added per patient request.   Covera-Hs [Verapamil Hcl] Palpitations   Dicyclomine Hcl Rash   Other Other (See Comments)    Glue from ekg/heart monitor leads --rash, Any MYCINS   Tessalon Perles Rash    Immunization History  Administered Date(s) Administered   Influenza Split 08/03/2011, 08/01/2012   Influenza,inj,Quad PF,6+ Mos 08/14/2013, 09/03/2014, 09/04/2015, 08/18/2016   Influenza-Unspecified 11/03/1999   Pneumococcal Conjugate-13 10/01/2014   Pneumococcal Polysaccharide-23 09/03/1999, 06/02/2006, 08/14/2013   Td 11/02/1994   Tdap 08/18/2016    Past Medical History:  Diagnosis Date   Acute diastolic heart failure, NYHA class 1 (HCC) 02/08/2017   Acute heart failure (HCC) 02/08/2017   Cataract    OD   CHF (congestive heart failure) (HCC) 03/02/2017   EF 25-30% 2018   Complication of anesthesia    various issues with oxygen  saturations post op   COPD (chronic obstructive pulmonary disease) (HCC)    Depression    Depression  [employer]reesia 11/04/2020   Diverticulitis    DVT (deep venous thrombosis) (HCC)    Fibromyalgia    GERD (gastroesophageal reflux disease)    Heart murmur    [employer]reesia 11/04/2020   Hiatal hernia    History of deviated nasal septum    left- side   HTN (hypertension)    Hyperlipidemia    Hypertension    [employer]reesia 11/04/2020   Hypertensive retinopathy    OU   Myocardial infarction [facility])    [employer]reesia 11/04/2020   Obesity    On supplemental oxygen therapy    concentrator at night @ 1.5 l/m or when sleeps. O2 Sat niormally 87.   Osteoporosis    Osteoporosis    [employer]reesia 11/04/2020   Oxygen deficiency    [employer]reesia 11/04/2020   PAT (paroxysmal atrial tachycardia) (HCC)    PFO (patent foramen ovale)    PULMONARY NODULE, LEFT LOWER LOBE 10/14/2009   5mm LLL nodule dec 2010. Stable and 4mm in Oct 2012. No further fu   PVD (peripheral vascular disease) with claudication (HCC) 12/2017   Right middle lobe pneumonia  07/24/2011   First noted at admit 07/10/11. Persists on cxr 07/22/11. Cleared on CT 08/24/11. No further followup   Stroke [facility])    TOBACCO ABUSE 06/04/2009    Tobacco History: Social History   Tobacco Use  Smoking Status Former   Packs/day: 3.00   Years: 30.00   Total pack years: 90.00   Types: Cigarettes   Quit date: 06/02/2010   Years since quitting: 12.0  Smokeless Tobacco Never  Tobacco Comments   QUIT IN 2011   Counseling given: Not Answered Tobacco comments: QUIT IN 2011   Outpatient Medications Prior to Visit  Medication Sig Dispense Refill   carvedilol (COREG) 12.5 MG tablet Take 1 tablet (12.5 mg total) by mouth 2 (two) times daily. 180 tablet 3   clopidogrel (PLAVIX) 75 MG tablet TAKE 1 TABLET BY MOUTH EVERY DAY 30 tablet 11   desvenlafaxine (PRISTIQ) 50 MG 24 hr tablet Take 1 tablet (50 mg total) by mouth daily. 30 tablet 2   diazepam (VALIUM) 2 MG tablet Take by mouth.     diclofenac sodium (VOLTAREN) 1 % GEL Apply 2 g topically 4 (four) times daily.  5   Eyelid Cleansers (OCUSOFT BABY EYELID & EYELASH EX) Apply topically.     furosemide (LASIX) 40 MG tablet TAKE 1 TABLET BY MOUTH EVERY DAY (Patient taking differently: Take 40 mg by mouth daily.) 30 tablet 11   gabapentin (NEURONTIN) 100 MG capsule Take 200 mg by mouth See admin instructions. Take 1 in am and 1 in the lunch and 2 at bedtime     HYDROcodone-acetaminophen (NORCO/VICODIN) 5-325 MG tablet Take 1 tablet by mouth 3 (three) times daily.     lidocaine (LIDODERM) 5 % Place 1-3 patches onto the skin as directed.     montelukast (SINGULAIR) 10 MG tablet TAKE 1 TABLET BY MOUTH EVERYDAY AT BEDTIME (Patient taking differently: Take 10 mg by mouth daily.) 90 tablet 2   Multiple Vitamins-Minerals (VITAMIN D3 COMPLETE PO) Take by mouth.     nitroGLYCERIN (NITROSTAT) 0.4 MG SL tablet PLACE 1 TABLET UNDER THE TONGUE EVERY 5 MINUTES AS NEEDED FOR CHEST PAIN (Patient taking differently: Place 0.4 mg under the tongue every 5  (five) minutes as needed for chest pain.) 25 tablet 3   Omega-3 Fatty Acids (FISH OIL) 1000 MG CAPS Take 1,000 mg by mouth daily.     ondansetron (ZOFRAN-ODT) 8 MG disintegrating  tablet Take 1 tablet (8 mg total) by mouth every 8 (eight) hours as needed for nausea or vomiting. 20 tablet 0   OXYGEN Inhale 1.5-3 L into the lungs as needed (for shortness of breath).     pantoprazole (PROTONIX) 40 MG tablet TAKE 1 TABLET BY MOUTH EVERY DAY 30 tablet 5   polyethylene glycol (MIRALAX / GLYCOLAX) packet Take 17 g by mouth daily as needed for mild constipation.      PRALUENT 75 MG/ML SOAJ INJECT 75 MG INTO THE SKIN EVERY 14 (FOURTEEN) DAYS. 2 mL 11   spironolactone (ALDACTONE) 25 MG tablet TAKE 1 TABLET BY MOUTH EVERY DAY 30 tablet 11   traZODone (DESYREL) 100 MG tablet Take one tab daily as needed for sleep 30 tablet 2   triamcinolone cream (KENALOG) 0.1 % Apply 1 application topically as needed (Yeast Infection).     warfarin (COUMADIN) 5 MG tablet TAKE 1/2 TO 1 TABLET BY MOUTH DAILY OR AS DIRECTED BY CLINIC 30 tablet 3   ipratropium (ATROVENT) 0.02 % nebulizer solution USE 1 VIAL BY NEBULIZATION 4 (FOUR) TIMES DAILY. 62.5 mL 3   predniSONE (DELTASONE) 10 MG tablet 4 tabs for 2 days, then 3 tabs for 2 days, 2 tabs for 2 days, then 1 tab for 2 days, then stop (Patient not taking: Reported on 06/23/2022) 20 tablet 0   No facility-administered medications prior to visit.     Review of Systems:   Constitutional: No weight loss or gain, night sweats, fevers, chills, or lassitude. +fatigue (improving) HEENT: No headaches, difficulty swallowing, tooth/dental problems, or sore throat. No sneezing, itching, ear ache, nasal congestion, or post nasal drip CV:  No chest pain, orthopnea, PND, swelling in lower extremities, anasarca, dizziness, palpitations, syncope Resp: +shortness of breath with exertion (improved); productive cough (improving). No wheezing. No hemoptysis.  No chest wall deformity GI:  No  heartburn, indigestion, abdominal pain, nausea, vomiting, diarrhea, change in bowel habits, loss of appetite, bloody stools.  Skin: No rash, lesions, ulcerations Neuro: No dizziness or lightheadedness.  Psych: No depression or anxiety. Mood stable.     Physical Exam:  BP 136/74 (BP Location: Right Arm, Patient Position: Sitting, Cuff Size: Normal)   Pulse 69   Temp 98.4 F (36.9 C) (Oral)   Ht 5\' 3"  (1.6 m)   Wt 182 lb (82.6 kg)   SpO2 95%   BMI 32.24 kg/m   GEN: Pleasant, interactive, chronically-ill appearing; obese; in no acute distress. HEENT:  Normocephalic and atraumatic. PERRLA. Sclera white. Nasal turbinates pink, moist and patent bilaterally. No rhinorrhea present. Oropharynx pink and moist, without exudate or edema. No lesions, ulcerations, or postnasal drip.  NECK:  Supple w/ fair ROM. No JVD present. Normal carotid impulses w/o bruits. Thyroid symmetrical with no goiter or nodules palpated. No lymphadenopathy.   CV: RRR, no m/r/g, no peripheral edema. Pulses intact, +2 bilaterally. No cyanosis, pallor or clubbing. PULMONARY:  Unlabored, regular breathing. Improved aeration; diminished bilaterally A&P w/o wheezes/rales/rhonchi. No accessory muscle use. No dullness to percussion. GI: BS present and normoactive. Soft, non-tender to palpation.  MSK: No erythema, warmth or tenderness. Cap refil <2 sec all extrem. No deformities or joint swelling noted.  Neuro: A/Ox3. No focal deficits noted.   Skin: Warm, no lesions or rashe Psych: Normal affect and behavior. Judgement and thought content appropriate.     Lab Results:  CBC    Component Value Date/Time   WBC 8.1 01/19/2022 0939   WBC 10.7 (H) 12/05/2021 2132  RBC 3.98 01/19/2022 0939   RBC 4.23 12/05/2021 2132   HGB 12.0 01/19/2022 0939   HCT 36.3 01/19/2022 0939   PLT 254 01/19/2022 0939   MCV 91 01/19/2022 0939   MCH 30.2 01/19/2022 0939   MCH 31.2 12/05/2021 2132   MCHC 33.1 01/19/2022 0939   MCHC 33.5  12/05/2021 2132   RDW 12.4 01/19/2022 0939   LYMPHSABS 1.0 12/05/2021 2132   LYMPHSABS 1.4 04/13/2018 0957   MONOABS 0.9 12/05/2021 2132   EOSABS 0.1 12/05/2021 2132   EOSABS 0.1 04/13/2018 0957   BASOSABS 0.0 12/05/2021 2132   BASOSABS 0.0 04/13/2018 0957    BMET    Component Value Date/Time   NA 135 12/05/2021 2132   NA 136 12/30/2020 1128   K 3.7 12/05/2021 2132   CL 96 (L) 12/05/2021 2132   CO2 31 12/05/2021 2132   GLUCOSE 141 (H) 12/05/2021 2132   BUN 18 12/05/2021 2132   BUN 12 12/30/2020 1128   CREATININE 1.09 (H) 12/05/2021 2132   CREATININE 0.91 08/18/2016 0843   CALCIUM 9.5 12/05/2021 2132   GFRNONAA 53 (L) 12/05/2021 2132   GFRNONAA 63 07/11/2015 0828   GFRAA 63 05/16/2020 0921   GFRAA 73 07/11/2015 0828    BNP    Component Value Date/Time   BNP 63.0 12/05/2021 2331     Imaging:  CT Chest Wo Contrast  Result Date: 05/29/2022 CLINICAL DATA:  Hemoptysis.  Cough.  Emphysema. EXAM: CT CHEST WITHOUT CONTRAST TECHNIQUE: Multidetector CT imaging of the chest was performed following the standard protocol without IV contrast. RADIATION DOSE REDUCTION: This exam was performed according to the departmental dose-optimization program which includes automated exposure control, adjustment of the mA and/or kV according to patient size and/or use of iterative reconstruction technique. COMPARISON:  01/15/2021 FINDINGS: Cardiovascular: No acute findings. Aortic atherosclerotic calcification incidentally noted. No significant change in 4.0 cm ascending thoracic aortic aneurysm. Mediastinum/Nodes: No masses or pathologically enlarged lymph nodes identified on this unenhanced exam. Lungs/Pleura: Moderate to severe centrilobular emphysema is again noted. No suspicious nodules or masses identified. New pulmonary airspace disease is seen in the right middle lobe with air bronchograms, consistent with infectious or inflammatory process. No evidence of pleural effusion. Upper Abdomen:   Unremarkable. Musculoskeletal:  No suspicious bone lesions. IMPRESSION: New right middle lobe pulmonary airspace disease, consistent with infectious or inflammatory process. No evidence of mass or lymphadenopathy. Stable 4.0 cm ascending thoracic aortic aneurysm. Recommend annual imaging followup by CTA or MRA. This recommendation follows 2010 ACCF/AHA/AATS/ACR/ASA/SCA/SCAI/SIR/STS/SVM Guidelines for the Diagnosis and Management of Patients with Thoracic Aortic Disease. Circulation. 2010; 121: Z610-R604. Aortic aneurysm NOS (ICD10-I71.9) Aortic Atherosclerosis (ICD10-I70.0) and Emphysema (ICD10-J43.9). Electronically Signed   By: Danae Orleans M.D.   On: 05/29/2022 16:36         Latest Ref Rng & Units 01/30/2021    8:51 AM 01/20/2016    9:35 AM 08/14/2013   10:17 AM  PFT Results  FVC-Pre L 2.61  2.79    FVC-Predicted Pre % 92  93  100   FVC-Post L   3.07   FVC-Predicted Post %   103   Pre FEV1/FVC % % 63  67  62   Post FEV1/FCV % %   64   FEV1-Pre L 1.63  1.86  1.84   FEV1-Predicted Pre % 77  82  82   FEV1-Post L   1.97   DLCO uncorrected ml/min/mmHg 9.45  9.75  11.57   DLCO UNC% % 49  40  50   DLCO corrected ml/min/mmHg 9.45     DLCO COR %Predicted % 49     DLVA Predicted % 57  51  58   TLC L 4.59  4.62  4.32   TLC % Predicted % 90  91  88   RV % Predicted % 80  78  77     No results found for: "NITRICOXIDE"      Assessment & Plan:   COPD, moderate (HCC) Clinically improved with resolving AECOPD. She is maintained on atrovent nebs; unable to tolerate ICS or SABA. No changes made to current regimen. Encouraged to continue mucociliary clearance therapies. Activity as tolerated.   Patient Instructions  Continue ipratropium 3 mL every 6 hours as needed for shortness of breath or wheezing. Use twice daily for maintenance  Continue singulair 10 mg At bedtime  Continue supplemental oxygen 2-3 lpm with activity and at night. Goal oxygen >88-90%  Continue Mucinex 600 mg Twice daily  for chest congestion/cough   Repeat CT chest mid October    Follow up in 8-10 weeks with Dr. Marchelle Gearing or Philis Nettle. If symptoms do not improve or worsen, please contact office for sooner follow up or seek emergency care.     CAP (community acquired pneumonia) RML airspace disease on CT chest from 7/28 with infectious symptoms. Treated with augmentin course, which she has completed. Clinically improved with resolving symptoms. Advised to monitor for worsening SOB, productive cough or new fevers. Plan to repeat CT chest in 8 weeks.   Chronic respiratory failure with hypoxia (HCC) Stable without any increased O2 demand. Continue supplemental oxygen 2-3 lpm for goal >88-90%.     I spent 28 minutes of dedicated to the care of this patient on the date of this encounter to include pre-visit review of records, face-to-face time with the patient discussing conditions above, post visit ordering of testing, clinical documentation with the electronic health record, making appropriate referrals as documented, and communicating necessary findings to members of the patients care team.  Noemi Chapel, NP 06/23/2022  Pt aware and understands NP's role.

## 2022-06-23 NOTE — Patient Instructions (Addendum)
Continue ipratropium 3 mL every 6 hours as needed for shortness of breath or wheezing. Use twice daily for maintenance  Continue singulair 10 mg At bedtime  Continue supplemental oxygen 2-3 lpm with activity and at night. Goal oxygen >88-90%  Continue Mucinex 600 mg Twice daily for chest congestion/cough   Repeat CT chest mid October    Follow up in 8-10 weeks with Dr. [name] or Alanson Aly. If symptoms do not improve or worsen, please contact office for sooner follow up or seek emergency care.

## 2022-06-26 ENCOUNTER — Ambulatory Visit (INDEPENDENT_AMBULATORY_CARE_PROVIDER_SITE_OTHER): Payer: Medicare Other

## 2022-06-26 DIAGNOSIS — Z5181 Encounter for therapeutic drug level monitoring: Secondary | ICD-10-CM

## 2022-06-26 DIAGNOSIS — I480 Paroxysmal atrial fibrillation: Secondary | ICD-10-CM | POA: Diagnosis not present

## 2022-06-26 DIAGNOSIS — Z7901 Long term (current) use of anticoagulants: Secondary | ICD-10-CM | POA: Diagnosis not present

## 2022-06-26 LAB — POCT INR: INR: 2.1 (ref 2.0–3.0)

## 2022-06-26 NOTE — Patient Instructions (Signed)
Continue taking 1/2 tablet daily except 1 tablet on Wednesday. Recheck INR in 6 weeks. [facility] [phone].

## 2022-07-21 ENCOUNTER — Encounter: Payer: Self-pay | Admitting: Cardiology

## 2022-07-21 MED ORDER — FUROSEMIDE 40 MG PO TABS: ORAL_TABLET | ORAL | 0 refills | Status: DC

## 2022-07-21 NOTE — Telephone Encounter (Signed)
Patient stated she gained 5 pounds over night. Her urine output has decreased over the past 2 days. She had dysuria and bought OTC phenazopyridine. No fever. Clothes fit tighter. Has edema both ankles. Also stated she has a hal-dollar-sized brusie  under left breast (no injury).  On plavix '75mg'$  daily and warfarin '5mg'$  daily. Last INR 2.1. Please advise on edema and decreased output.

## 2022-07-24 ENCOUNTER — Encounter: Payer: Self-pay | Admitting: Cardiology

## 2022-07-26 ENCOUNTER — Other Ambulatory Visit: Payer: Self-pay | Admitting: Cardiology

## 2022-07-26 DIAGNOSIS — I2510 Atherosclerotic heart disease of native coronary artery without angina pectoris: Secondary | ICD-10-CM

## 2022-07-26 DIAGNOSIS — E7800 Pure hypercholesterolemia, unspecified: Secondary | ICD-10-CM

## 2022-08-03 ENCOUNTER — Other Ambulatory Visit: Payer: Self-pay | Admitting: Cardiology

## 2022-08-06 ENCOUNTER — Encounter: Payer: Self-pay | Admitting: Internal Medicine

## 2022-08-06 ENCOUNTER — Other Ambulatory Visit: Payer: Self-pay | Admitting: Nurse Practitioner

## 2022-08-06 DIAGNOSIS — J189 Pneumonia, unspecified organism: Secondary | ICD-10-CM

## 2022-08-06 NOTE — Telephone Encounter (Signed)
The order is in. They should be calling to get her scheduled within the next week. If she has not heard, please have her call to follow up on this with the PCCs. She should keep her appt with Dr. [name] as scheduled. Thanks.

## 2022-08-07 ENCOUNTER — Ambulatory Visit: Payer: Medicare Other | Attending: Cardiovascular Disease

## 2022-08-07 DIAGNOSIS — Z5181 Encounter for therapeutic drug level monitoring: Secondary | ICD-10-CM

## 2022-08-07 DIAGNOSIS — Z7901 Long term (current) use of anticoagulants: Secondary | ICD-10-CM

## 2022-08-07 DIAGNOSIS — I480 Paroxysmal atrial fibrillation: Secondary | ICD-10-CM

## 2022-08-07 LAB — POCT INR: INR: 2.2 (ref 2.0–3.0)

## 2022-08-07 NOTE — Patient Instructions (Signed)
Continue taking 1/2 tablet daily except 1 tablet on Wednesday. Recheck INR in 6 weeks. [facility] [phone].

## 2022-08-15 ENCOUNTER — Other Ambulatory Visit (HOSPITAL_COMMUNITY): Payer: Self-pay | Admitting: Psychiatry

## 2022-08-15 DIAGNOSIS — F331 Major depressive disorder, recurrent, moderate: Secondary | ICD-10-CM

## 2022-08-18 ENCOUNTER — Ambulatory Visit (HOSPITAL_COMMUNITY)
Admission: RE | Admit: 2022-08-18 | Discharge: 2022-08-18 | Disposition: A | Discharge status: Home / Self Care | Payer: Medicare Other | Source: Ambulatory Visit | Attending: Nurse Practitioner | Admitting: Nurse Practitioner

## 2022-08-18 DIAGNOSIS — J189 Pneumonia, unspecified organism: Secondary | ICD-10-CM | POA: Insufficient documentation

## 2022-08-20 ENCOUNTER — Telehealth (HOSPITAL_BASED_OUTPATIENT_CLINIC_OR_DEPARTMENT_OTHER): Payer: Medicare Other | Admitting: Psychiatry

## 2022-08-20 ENCOUNTER — Encounter (HOSPITAL_COMMUNITY): Payer: Self-pay | Admitting: Psychiatry

## 2022-08-20 DIAGNOSIS — F411 Generalized anxiety disorder: Secondary | ICD-10-CM

## 2022-08-20 DIAGNOSIS — F331 Major depressive disorder, recurrent, moderate: Secondary | ICD-10-CM

## 2022-08-20 MED ORDER — DESVENLAFAXINE SUCCINATE ER 50 MG PO TB24: 50.0000 mg | ORAL_TABLET | Freq: Every day | ORAL | 2 refills | Status: DC

## 2022-08-20 MED ORDER — TRAZODONE HCL 100 MG PO TABS: ORAL_TABLET | ORAL | 2 refills | Status: DC

## 2022-08-20 NOTE — Progress Notes (Signed)
Virtual Visit via Telephone Note  I connected with Laurie Horton on 08/20/22 at  8:20 AM EDT by telephone and verified that I am speaking with the correct person using two identifiers.  Location: Patient: Home Provider: Home Office   I discussed the limitations, risks, security and privacy concerns of performing an evaluation and management service by telephone and the availability of in person appointments. I also discussed with the patient that there may be a patient responsible charge related to this service. The patient expressed understanding and agreed to proceed.   History of Present Illness: Patient is evaluated by phone session.  She noticed lately having memory issues more than she anticipated.  She is struggling with focus, remembering things.  She recalled there are times when she was cooking the food she forgets to turn off the stove.  She has these symptoms for a while but in the past few months they are more prominent.  She is very concerned about her memory.  She is afraid to go out.  She does drive very short distance if needed.  She also have vision issues and she was recommended to have laser surgery in both eye but she is afraid to do same time in both eyes.  Patient has history of cataract removal few years ago.  She has a history of stroke 20 years ago.  Patient has multiple health issues and to live with her 2 cats.  She reported having a better terms with her older daughter and she does talk to her on and off.  Her younger daughter who is legally blind lives in [place] near [place] and she had very good times with her who comes once a month with her husband to visit her.  Patient denies any panic attack or crying spells but does still feel very nervous, anxious and overwhelmed around crowded places.  She liked the Pristiq and trazodone but it is helping her sleep and anxiety.  She denies any suicidal thoughts or homicidal thoughts.  Her appetite is okay.  Her weight is  unchanged from the past.  She has no concerns from the medication.  She does take multiple medication for her multiple health needs including pain medication.    Past Psychiatric History:  H/O depression and anxiety. No H/O inpatient treatment, suicidal attempt, paranoia, mania and hallucination. Seeing psychiatrist in 2006 after a stroke.  Tried Cymbalta, lexapro and Celexa in the past with limited response.  Tried Ambien, Lunesta, lexapro, Lamictal and Xanax but developed allergies and side effects.  Tried higher Pristiq but has side effects. Valium helped in past.  Psychiatric Specialty Exam: Physical Exam  Review of Systems  Weight 181 lb (82.1 kg).There is no height or weight on file to calculate BMI.  General Appearance: NA  Eye Contact:  NA  Speech:  Slow  Volume:  Decreased  Mood:  Anxious  Affect:  NA  Thought Process:  Descriptions of Associations: Intact  Orientation:  Full (Time, Place, and Person)  Thought Content:  Rumination  Suicidal Thoughts:  No  Homicidal Thoughts:  No  Memory:  Immediate;   Good Recent;   Fair Remote;   Fair  Judgement:  Intact  Insight:  Present  Psychomotor Activity:  Normal  Concentration:  Concentration: Fair and Attention Span: Fair  Recall:  [employer] of Knowledge:  Fair  Language:  Good  Akathisia:  No  Handed:  Right  AIMS (if indicated):     Assets:  Communication Skills Desire  for Improvement Housing  ADL's:  Intact  Cognition:  Impaired,  Mild  Sleep:   6 hrs      Assessment and Plan: Major depressive disorder, recurrent.  Generalized anxiety disorder.  Patient experiencing worsening of her memory and forgetfulness.  I recommend should contact the neurologist Dr. Leonie Man who she had seen in the past.  Patient has multiple health issues including A-fib, CHF, COPD and she also takes multiple medication including pain meds.  We did talk about polypharmacy and she also have vision issue and need laser surgery.  Recommend should  have workup for memory problem by neurology at this time.  Patient agreed with the plan.  She like to continue Pristiq and trazodone but is helping her anxiety and depression.  She is not interested in therapy.  Recommend to call us back if she has any question or any concern.  Follow-up in 3 months.  Continue Pristiq 50 mg daily and trazodone 100 mg at bedtime  Follow Up Instructions:    I discussed the assessment and treatment plan with the patient. The patient was provided an opportunity to ask questions and all were answered. The patient agreed with the plan and demonstrated an understanding of the instructions.   The patient was advised to call back or seek an in-person evaluation if the symptoms worsen or if the condition fails to improve as anticipated.  Collaboration of Care: Other provider involved in patient's care AEB notes are available in epic to review.  Patient/Guardian was advised Release of Information must be obtained prior to any record release in order to collaborate their care with an outside provider. Patient/Guardian was advised if they have not already done so to contact the registration department to sign all necessary forms in order for [place] to release information regarding their care.   Consent: Patient/Guardian gives verbal consent for treatment and assignment of benefits for services provided during this visit. Patient/Guardian expressed understanding and agreed to proceed.    I provided 18 minutes of non-face-to-face time during this encounter.   Kathlee Nations, MD

## 2022-08-26 NOTE — Progress Notes (Signed)
Right middle lobe looks much better and pneumonia appears resolved. Follow up with Dr. [name] as scheduled.

## 2022-08-27 ENCOUNTER — Encounter: Payer: Self-pay | Admitting: Internal Medicine

## 2022-08-27 ENCOUNTER — Ambulatory Visit (INDEPENDENT_AMBULATORY_CARE_PROVIDER_SITE_OTHER): Payer: Medicare Other | Admitting: Internal Medicine

## 2022-08-27 VITALS — BP 116/82 | HR 77 | Temp 98.3°F | Ht 63.0 in | Wt 181.8 lb

## 2022-08-27 DIAGNOSIS — Z8701 Personal history of pneumonia (recurrent): Secondary | ICD-10-CM | POA: Diagnosis not present

## 2022-08-27 DIAGNOSIS — Z122 Encounter for screening for malignant neoplasm of respiratory organs: Secondary | ICD-10-CM

## 2022-08-27 DIAGNOSIS — J9611 Chronic respiratory failure with hypoxia: Secondary | ICD-10-CM

## 2022-08-27 DIAGNOSIS — Z7185 Encounter for immunization safety counseling: Secondary | ICD-10-CM

## 2022-08-27 DIAGNOSIS — J449 Chronic obstructive pulmonary disease, unspecified: Secondary | ICD-10-CM

## 2022-08-27 DIAGNOSIS — Z889 Allergy status to unspecified drugs, medicaments and biological substances status: Secondary | ICD-10-CM | POA: Diagnosis not present

## 2022-08-27 NOTE — Addendum Note (Signed)
Addended by: Chanetta Marshall on: 08/27/2022 10:41 AM   Modules accepted: Orders

## 2022-08-27 NOTE — Patient Instructions (Addendum)
ICD-10-CM   1. Chronic respiratory failure with hypoxia (HCC)  J96.11     2. Vaccine counseling  Z71.85     3. Multiple drug allergies  Z88.9     4. History of recent pneumonia  Z87.01     5. Screening for lung cancer  Z12.2      Chronic respiratory failure with hypoxia (HCC) Emphysema  -Stable disease  Plan - Walking desaturation test for oxygen qualification today - Continue ipratropium 3 mL every 6 hours as needed for shortness of breath or wheezing. Use twice daily for maintenance  - Continue singulair 10 mg At bedtime -  - Continue supplemental oxygen 2-3 lpm with activity and at night. Goal oxygen >88-90%  - Continue Mucinex 600 mg Twice daily for chest congestion/cough  Vaccine counseling Multiple drug allergies  -Respect hesitancy towards COVID, RSV and flu vaccine  Plan - Monitor clinically  History of recent pneumonia Screening for lung cancer  -No evidence of pneumonia on CT scan of the chest October 2023.  No lung nodules no lung cancer.  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   Follow up in 4 months with Dr. [name] or sooner if needed

## 2022-08-27 NOTE — Progress Notes (Addendum)
OV 05/19/2016  Chief Complaint  Patient presents with   Follow-up    Pt c/o worsening SOB, prod cough with thick white mucus.      This is a routine follow-up. Last visit was in April 2017 with nurse practitioner. At that time treated for COPD exacerbation according to her history but review of the chart does not show that to be true. At this point in time she says COPD stable although she says she might be in flare up on account of her fibromyalgia. Starting her symptoms out it appears that it is generally stable with dyspnea at baseline and cough with mild sputum at baseline. She is more hobbled by her chronic pain and fibromyalgia. Couple months ago apparently a hydrocodone was discontinued by rheumatology and therefore she is having "withdrawal". She does have a new pain medication physician. She is on gabapentin for fibromyalgia. There are no other new issues. She does not want antibiotics or prednisone for her perceived exacerbation  OV 11/17/2016  Chief Complaint  Patient presents with   Follow-up    Pt states her SOB has worsened since last OV. Pt states she has a burning in her chest when she becomes SOB. Pt c/o prod cough with white mucus in morning, cough becomes nonprod throughout the day.     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 Last CT chest March 2017 without any mass and associated mild cor pulmonale   Six-month follow-up visit. She is completely overwhelmed by her fibromyalgia and depression. The fibromyalgia is worse. She says she is change doctors because of this. She's had a few admissions in the interim but none of them give a COPD exacerbation according to chart review. She suffered frustrated by her heels oxygen system even though it is light it is causing her pain. She uses Atrovent nebulizer but wants change to something else but at the same time has rejected use of any other  nebulizer or oral inhaler because of side effects. She is burning chest pain with inspiration and associated with her costochondral junction trigger points. 2014 review of the chart shows normal cardiac stress test. November 2017 chest x-ray is clear. She does not want any further imaging. Chest pain is mild to severe and variable. Worsened with inspiration. No radiation associated wheezing. No sputum production  OV 05/25/2017  Chief Complaint  Patient presents with   Follow-up    Pt states her SOB has worsened since last OV in 11/2016. Pt states she only coughs after her neb treatment - pt states her mucus is yellow in color and c/o occ chest discomfort. Pt denies f/c/s.     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 Last CT chest March 2017 without any mass and associated mild cor pulmonale   Follow-up exertional hypoxemia associated with his emphysema. Also associated with fibromyalgia. Last visit she had atypical chest pain. Recommended she see cardiology. 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 According to her history she has significant coronary artery disease but is fairly advanced. She is frustrated with this but realistic. She feels her days are number. In terms of COPD emphysema and is stable. She is on Atrovent inhalers. She is on oxygen. She wants a lighter system. She still burden by fibromyalgia. 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  She uses oxygen and nebulizers.  She is not interested in rehab or vaccines.  New issue: Preoperative pulmonary evaluation.  She is having significant bilateral lower extremity claudication.  She states that she is in severe pain walking from her door to the mailbox to the point she is almost crying.  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  Apparently Dr. Trula Slade wants to try a laparotomy approach.  However Dr. Broadus John might take her to the cardiac Cath Lab and placed stents.  She says she is sensitive to fentanyl and is worried about anesthesia complications but the pain is so severe she is willing to take the risk.  She has had previous cardiac catheterization without any problems other than being sensitive to fentanyl.  She says this can be done in the cardiac Cath Lab with anesthesia support.  She wants me to talk to Dr. Betsy Coder and Dr. Trula Slade about this.   OV 07/18/2018  Subjective:  Patient ID: Laurie Horton, female , DOB: 1946-10-28 , age 76 y.o. , MRN: [id number] , ADDRESS: [address] [place] [address]   07/18/2018 -   Chief Complaint  Patient presents with   Follow-up    Pt states her chest is hurting her all the time now and states she does not think her neb solutions are working for her anymore now. Pt also states she has had some worsening SOB and is also coughing up white phlegm which is comes out in chunks.     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  She uses oxygen and nebulizers. Last CT chest March 2017 without any mass and associated mild cor pulmonale   HPI CLYDENE BURACK 76 y.o. -after last visit she saw  nurse practitioner in June 2019 fora mild respiratory flare. At the time treated with allergy medications antihistamines.She tells me thathe last saw me January 2019 she had an iliac stent place in the left side and after that her effort tolerance is improved but in the last few months she's noticed a decrease in effort tolerance with worsening dyspnea and also increased cough and increased sputum production in volume and also consistency without change in color This no fever or weight loss. She will not have a flu shot because of prior allergy. Her last CT scan of the chest was in 2017 and she is requesting for another one. Her inhaler as ipratropium which she says she's not happy with. In the past she's uses Spiriva and Symbicort and these have caused blisters and so she is generally where you have inhalers although she wants something other than her current one.       08/19/2018  - Visit   [name].  Patient was last seen in our office  visit on 07/18/2018 started on Bevespi.  Patient reports that after 1 day of use of the Bevespi inhaler she developed thrush and mouth sores.  She she contacted our office to be treated with nystatin.  Patient reports that most mouth sores resolved there remains one.  Patient is also having extensive dental work done.  Patient also has been treated for scabies as well as impetigo by dermatology recently.   Patient completed a high-res CT in 07/29/2018 that showed no real changes from baseline.  Still showing severe emphysema.   Patient reports that she continues to use her Atrovent nebulizer but is wondering if there is any other options available for her.  Patient feels that the Atrovent nebulizer is not working as well.  Patient is currently using as needed and not using it scheduled.  MMRC - Breathlessness Score 3 - I stop for breath after walking about 100 yards or after a few minutes on level ground (isle at grocery  store is 14f)  Patient reports that she has known triggers of shortness of breath with exertion, when there is high pollen counts, when she is walking, when she is outside for extended periods of time.  Patient reports she is been using 2 L via nasal cannula of oxygen with exertion as well as at rest.  Patient reports she forgot her POC at home.  She arrived to our office on room air.  Oxygen saturations 87.  Patient refused oxygen in our office and states that she does not think she needs oxygen right now.  Patient is sad and concerned regarding her limitations with vascular surgery.  Patient believes that she needs a surgery but reports that vascular team as well as cardiology does not think that she would be a good surgical candidate.  She reports that she has not heard back from Dr. BNaida Sleightoffice regarding her most recent test results.   Tests:  01/21/2016-CT chest without contrast- moderate centrilobular emphysema and diffuse bronchial wall thickening mild subpleural density in the dependent lower lobes  01/20/2016-pulmonary function test- airway obstruction and diffusion defect suggesting emphysema  Imaging:  02/11/2017-chest x-ray-stable large cardiac silhouette, lungs are hyperinflated, interstitial edema pattern unchanged from prior scans   Cardiac:  07/08/2017-echocardiogram-LV ejection fraction 50 to 541% grade 1 diastolic dysfunction      OV 12/21/2018  Subjective:  Patient ID: KArmen Horton female , DOB: 201/17/1947, age 76y.o. 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  She uses oxygen and nebulizers. Last CT chest March 2017 without any mass and associated mild cor pulmonale  12/21/2018 -   Chief Complaint  Patient presents with   Follow-up    Pt states due to being switched to a  new medication, she has had labored breathing. SOB is with exertion, has an occ cough with white phlegm, and also has had some occ CP.     HPI KSARYIAH BENCOSME89y.o. -  Presents for routine follow-up.  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  She uses oxygen sporadically and nebulizer sporadically.  She is very afraid of medicines because of multiple allergies.  She says her psychiatrist Dr. AMelissa Montaneplaced her on some medications that caused a rash.  Other than that she is okay.  OV 09/15/2019  Subjective:  Patient ID: Laurie Horton, female , DOB: [birth year] , age 81 y.o. , MRN: [id number] , ADDRESS: [address] 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  She uses oxygen and nebulizers. Last CT chest March 2017 without any mass and associated mild cor pulmonale   09/15/2019 -  No chief complaint on file.    HPI AUNISTY REALI 76 y.o. -     ROS - per HPI     OV 01/17/2020  Subjective:  Patient ID: Laurie Horton, female , DOB: 1946/02/23 , age 80 y.o. , MRN: [id number] , ADDRESS: [address] [place] [address]   01/17/2020 -   Chief Complaint  Patient presents with   Follow-up     HPI MACY POLIO 76 y.o. -presents for face-to-face follow-up.  In December she called with a COPD flareup symptoms.  She wanted Biaxin despite her allergies.  She feels that Biaxin helps her.  She says she was given prednisone which helped but she was only given generic clarithromycin instead of the tradename Biaxin.  She says it did not work.  She wants tradename Biaxin only.  She says since then her cough is more than baseline.  She also is like sputum that is discolored.  She feels like she is in exacerbation and this is preventing her from getting the COVID-19  vaccine.  She feels another course of tradename Biaxin is required.  She again does not want generic clarithromycin.  She is compliant with her baseline nebulizer nighttime oxygen.  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  She is now recovering from that.  She talked about Covid vaccine.  She wants to get it.  He has had pneumonia vaccine without problem but recently flu shot she feels this put her in the hospital.  She also has multiple oral drug allergies.  Overall she continues to mask and follow social distancing.       CAT COPD Symptom & Quality of Life Score (GSK trademark) 0 is no burden. 5 is highest burden 07/18/2018  12/21/2018  01/17/2020   Never Cough -> Cough all the time _0 No phlegm in chest -> Chest is full of phlegm _1 No chest tightness -> Chest feels very tight _2 No dyspnea for 1 flight stairs/hill -> Very dyspneic for 1 flight of stairs _3 No limitations for ADL at home -> Very limited with ADL at home _4 Confident leaving home -> Not at all confident leaving home 0 1 2  Sleep soundly -> Do not sleep soundly because of lung condition _5 Lots of Energy -> No energy at all _6 TOTAL Score (max 40)  _7 No flowsheet data found.    12/19/2020 -   Chief Complaint  Patient presents with   Follow-up    Had Covid PNA 10/2020, doing some better     HPI THANH POMERLEAU 76 y.o. -presents for follow-up of her COPD.  She continues to use Atrovent nebulizer.  She prefers nebulizers of inhaler.  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  She uses oxygen at night with exertion.  She tells me that in  December 2021 around Christmas she had respiratory viral symptoms.  She says rapid antigen test by her daughter was negative.  Then in January 2022 she followed up with primary care physician who did a chest x-ray that showed pneumonia.  I have the chest x-ray with me  visualized it.  There is an infiltrate.  She says she was told that she had Covid but there is no confirmatory evidence for this.  She treated herself at home.  She cannot have vaccines because of multiple drug allergies according to history.  I supported her in the decision making.  She is worried about abnormal chest x-ray.        CAT Score 12/19/2020  Total CAT Score 18       OV 01/30/2021  Subjective:  Patient ID: Laurie Horton, female , DOB: 1946-08-22 , age 89 y.o. , MRN: [id number] , ADDRESS: [address], [place] [address] PCP [name] Agreste, MD Patient Care Team: [name] Agreste, MD as PCP - General (Family Medicine) Lorretta Harp, MD as PCP - Cardiology (Cardiology) Margaretha Sheffield, MD as Referring Physician (Physical Medicine and Rehabilitation) Laurence Spates, MD (Inactive) as Consulting Physician (Gastroenterology) Garvin Fila, MD as Consulting Physician (Neurology) [place], Peter M, MD as Consulting Physician (Cardiology) Brand Males, MD as Consulting Physician (Pulmonary Disease) Adele Schilder Arlyce Harman, MD as Consulting Physician (Psychiatry) Lorretta Harp, MD as Consulting Physician (Peripheral Vascular Disease)  This Provider for this visit: Treatment Team:  Attending Provider: Brand Males, MD    01/30/2021 -   Chief Complaint  Patient presents with   Follow-up    Doing ok, breathing is the same     HPI Laurie Horton 76 y.o. -presents for follow-up for COPD.  Here to review the results.  Her daughter Norie Latendresse is on the phone.  No new interim complaints.  A Covid IgG is negative thus making her recent viral infection is unlikely as Covid.  I offered a referral for Covid monoclonal antibody prophylaxis but she declined.  Her pulmonary function test shows 12% decline in FEV1 in 5 years.  I reviewed the chart and do not find an alpha-1 check.  I presume this was done prior to [place] using electronic medical records.  She  is open to getting it tested again.  She is to go since 2 COPD.  She tells me that the DME company is out of supply with tubing for her nebulizer.  She has used an inhaler but she is somewhat skeptical because of nasal septal perforation.  Explained to her inhalers oral.  She then talked about dentures.  Explained that we can do inhalers through an [facility].  She had high-resolution CT chest.  This shows emphysema.  There is evidence of cor pulmonale.  There is stable thoracic aorta aneurysm.  No fibrosis no cancer.   CAT Score 01/30/2021 12/19/2020  Total CAT Score 10 18        Ref Range & Units 1 mo ago  SARS COV1 AB(IGG)SPIKE,SEMI QN <1.00 index <1.00         IMPRESSION: 1. No evidence of interstitial lung disease. Air trapping is indicative of small airways disease. 2. Ascending aortic aneurysm, stable. Recommend annual imaging followup by CTA or MRA. This recommendation follows 2010 ACCF/AHA/AATS/ACR/ASA/SCA/SCAI/SIR/STS/SVM Guidelines for the Diagnosis and Management of Patients with Thoracic Aortic Disease. Circulation. 2010; 121: E174-Y814. Aortic aneurysm NOS (ICD10-I71.9). 3. Aortic atherosclerosis (ICD10-I70.0). Coronary artery calcification. 4. Enlarged pulmonic trunk, indicative of  pulmonary arterial hypertension. 5.  Emphysema (ICD10-J43.9).     Electronically Signed   By: Lorin Picket M.D.   On: 01/15/2021 11:52     OV 07/22/2021  Subjective:  Patient ID: Laurie Horton, female , DOB: 04/04/1946 , age 71 y.o. , MRN: [id number] , ADDRESS: [address] [place] [address] PCP [name] Agreste, MD Patient Care Team: [name] Agreste, MD as PCP - General (Family Medicine) [place], Peter M, MD as PCP - Cardiology (Cardiology) Margaretha Sheffield, MD as Referring Physician (Physical Medicine and Rehabilitation) Laurence Spates, MD (Inactive) as Consulting Physician (Gastroenterology) Garvin Fila, MD as Consulting Physician  (Neurology) [place], Peter M, MD as Consulting Physician (Cardiology) Brand Males, MD as Consulting Physician (Pulmonary Disease) Adele Schilder Arlyce Harman, MD as Consulting Physician (Psychiatry)  This Provider for this visit: Treatment Team:  Attending Provider: Brand Males, MD    07/22/2021 -   Chief Complaint  Patient presents with   Follow-up    Pt states she had pneumonia since last visit. States she went to see PCP after finishing meds and was told she still had infection and was placed back on abx by PCP.   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  She uses oxygen and nebulizer  HPI AIRIANA ELMAN 76 y.o. -returns for follow-up.  On 06/18/2021 she called with COPD exacerbation symptoms.  Gave her Biaxin and prednisone.  But she tells me that because the line was busy our office did not call in the medications till 5 days later when they got hold of her.  She says she is only partially improved and she is her primary care physician.  She had chest x-ray 07/16/2021.  This reports of interstitial prominence.  However there is no mass or consolidation.  She had basic labs that I reviewed and it is normal.  Apparently primary care physician gave her another Biaxin.  But there is no prednisone.  She still has yellow symptoms.  Symptoms only partially resolved.  She feels another round of prednisone will help her.  Of note we put her on triple inhaler BREZTRI last visit but this gave her thrush.  She is taking ipratropium.  If she only takes it 3 times a day when she has tremors that she is managing just with 2 times a day and this is insufficient.  She is using oxygen continuous now up to 2-1/2 L.  She is now moved downstairs because of her shortness of breath.  She is wanting oxygen concentrator upstairs and downstairs.  In the last few to several years she is refused flu shot.   She will not have the COVID-vaccine either.      OV 08/21/2021  Subjective:  Patient ID: Laurie Horton, female , DOB: 1946/06/28 , age 77 y.o. , MRN: [id number] , ADDRESS: [address], [place] [address] PCP [name] Agreste, MD Patient Care Team: [name] Agreste, MD as PCP - General (Family Medicine) [place], Peter M, MD as PCP - Cardiology (Cardiology) Margaretha Sheffield, MD as Referring Physician (Physical Medicine and Rehabilitation) Laurence Spates, MD (Inactive) as Consulting Physician (Gastroenterology) Garvin Fila, MD as Consulting Physician (Neurology) [place], Peter M, MD as Consulting Physician (Cardiology) Brand Males, MD as Consulting Physician (Pulmonary Disease) Adele Schilder Arlyce Harman, MD as Consulting Physician (Psychiatry)  This Provider for this visit: Treatment Team:  Attending Provider: Brand Males, MD  Follow-up  emphysema with chronic hypoxemic restorative failure and associated fibromyalgia and associated chronic systolic heart failure: Overall doing well.  She uses oxygen and nebulizer  08/21/2021 -   Chief Complaint  Patient presents with   Follow-up    Pt was at the ED 10/17 after PCP sent her there. States that she has been using Yupelri neb sol. States that it does make her feel dizzy but has helped with her breathing better than prior neb sol.     HPI JOYE WESENBERG 76 y.o. -returns for follow-up to have a follow-up chest x-ray because of his concerns of interstitial edema versus atypical pneumonia at last visit.  I look to the follow-up chest x-ray.  Personal visualization shows it is unchanged.  I think this is a baseline based on the fact his CT scan of the chest in March 2022 look very similar.  She is feeling stable.  Also last visit we put her on Yupelri nebulizer.  Part of this follow-up was to see how she is doing with that.  She says it makes her dizzy but then after a while she is able to tolerate it.  So she is careful  and some days she does not use it.  But overall she feels its more beneficial than ipratropium.  Therefore she wants to stick with it.  Of note on 08/18/2021 she did have a video visit with her psychiatrist for depression.  She also has had a skin biopsy in her left thigh and apparently this was infected and she is sent to the ED for IV antibiotics but there was 11-hour wait and she left AMA without being seen.  She is very upset about her experience.  She is reflecting on it and is wondering whether she should go to the medical board and file a formal complaint.  But overall from a respiratory standpoint she is stable.    The other issues that she wants to oxygen concentrator's.  1 5 stairs and 1 for downstairs.  Last visit I thought I put an order in but she says at that never got it.  She feels service at [facility] is terrible.  I informed her that we will do the order again.          OV 11/13/2021  Subjective:  Patient ID: Laurie Horton, female , DOB: 1946-06-23 , age 89 y.o. , MRN: [id number] , ADDRESS: [address], [place] [address] PCP Merrilee Seashore, MD Patient Care Team: Merrilee Seashore, MD as PCP - General (Internal Medicine) [place], Peter M, MD as PCP - Cardiology (Cardiology) Margaretha Sheffield, MD as Referring Physician (Physical Medicine and Rehabilitation) Laurence Spates, MD (Inactive) as Consulting Physician (Gastroenterology) Garvin Fila, MD as Consulting Physician (Neurology) [place], Peter M, MD as Consulting Physician (Cardiology) Brand Males, MD as Consulting Physician (Pulmonary Disease) Adele Schilder Arlyce Harman, MD as Consulting Physician (Psychiatry)  This Provider for this visit: Treatment Team:  Attending Provider: Brand Males, MD    11/13/2021 -   Chief Complaint  Patient presents with   Follow-up    Pt states her breathing has become a little worse since last visit. States she has had a virus for the past 10 days.    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  She uses oxygen and nebulizer  HPI SUNDY HOUCHINS 76 y.o. -returns for follow-up.  Is a 54-monthroutine follow-up.  She tells  me that some of her neighbors got sick with the flu.  She had flulike symptoms which she is flu negative.  She was in bed for a week.  She felt it was mild.  She did not call us.  She is back to baseline now.  Overall she is stable.  She tells me that her biggest issue is the portable oxygen.  She says Medicare refused to give her 2 systems 1 5 stairs and 1 for downstairs.  Currently she has a Haematologist inside the house that she plugs and uses it at night.  For exertion she uses a Helio system.  She feels that he does system using 100 pound.  Oxygen gallon tank that is outside on the patio.  She wants to bring this inside and then the DME company will run her house for upstairs and downstairs.  She says when she goes into the system the DME company says she cannot revert back to her concentrator.  She is a little bit nervous about this approach with the biggest hold-up is that the DME company is asking for credit card information and she is worried about identity theft.  And price gouging   OV 05/22/2022  Subjective:  Patient ID: Laurie Horton, female , DOB: October 15, 1946 , age 33 y.o. , MRN: [id number] , ADDRESS: [place] [id number] PCP Merrilee Seashore, MD Patient Care Team: Merrilee Seashore, MD as PCP - General (Internal Medicine) Martinique, Peter M, MD as PCP - Cardiology (Cardiology) Margaretha Sheffield, MD as Referring Physician (Physical Medicine and Rehabilitation) Laurence Spates, MD (Inactive) as Consulting Physician (Gastroenterology) Garvin Fila, MD as Consulting Physician (Neurology) Martinique, Peter M, MD as Consulting Physician (Cardiology) Brand Males, MD as Consulting Physician (Pulmonary Disease) Adele Schilder Arlyce Harman, MD as Consulting Physician (Psychiatry)  This Provider for this visit: Treatment Team:  Attending Provider: Brand Males, MD  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  She uses oxygen and nebulizer . Last CT chest MArch 2022  05/22/2022 -   Chief Complaint  Patient presents with   Follow-up    Follow-up SOB, Coughing, wheezing     HPI JEANE CASHATT 76 y.o. -returns for follow-up.  This is a routine follow-up but she tells me that since early June 2023 she has been sick.  Apparently she finished grocery shopping and then came home had chills diarrhea and cough shortly after that with a severe cough she had hemoptysis that started and resolved a week later but she was in bed for 21 days.  She was increased use of nebulizer.  She believes it was a respiratory virus.  Apparently repeated COVID test was negative.  She was so ill that her daughters who live an hour and a half however dropping off food for her.  She lives alone.  Currently she is better but still having wheezing.  She is still using nebulizer twice daily.  She is also got postnasal drip and she is needing Benadryl but the hemoptysis resolved.  She is willing to take prednisone.  She continues oxygen and her regular nebulizer and Singulair.  Last CT scan of the chest was over a year ago.  She is willing to get this repeated.   05/22/2022: OV with Dr. [name]. Intended to be routine follow up but she had been having sick symptoms since June 2023. She had gone grocery shopping and came  home with chills, diarrhea, and cough. She also developed hemoptysis, which resolved after a week. Stated she was in bed for 21 days. Doing better at OV but still wheezing. She is also having postnasal drip. 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  Continue supplemental O2. Continue nebulized medications. Continued singulair. Plan for CT chest in 2-4 weeks.   06/09/2022: OV with Cobb NP for follow-up after being treated for slow to resolve AECOPD related to respiratory virus.  She completed prednisone course and had scheduled CT in the interim.  CT scan showed a right middle lobe airspace disease, concerning for pneumonia.  She has not had any antibiotics up to this point.  Today, she reports still having some increased shortness of breath from her baseline.  She did feel better when she was on the prednisone.  She also has a productive cough with yellow to green sputum and chest congestion.  She has not had any further episodes of hemoptysis.  She does have an occasional wheeze, which improves with her nebulizer treatments.  She denies any fevers, night sweats, orthopnea, leg swelling, URI symptoms, interim sick exposures.  She is using her DuoNeb 3 times a day.  She states that she is unable to tolerate any and all inhalers as they cause irritation to the back of her throat and mouth.  She takes Singulair at bedtime.  She continues on her supplemental oxygen with activity and at night.  Usually uses 2-3 lpm.  Viral respiratory infection in June with persistent AECOPD. Treated with prednisone taper in July. CT chest 7/28 revealed a new RML airspace consolidation, concerning for pna given her infectious symptoms. We will treat her with augmentin 7 day course. Advised to use mucinex to assist with mucociliary clearance. Repeat CT chest 6 weeks after completion of abx.  06/23/2022: Today - follow up Patient presents today for follow up after being treated for AECOPD and pneumonia. She reports that she has been feeling much better since she completed her abx and prednisone. Breathing is back to baseline and her cough is clearing up, usually with very pale yellow or clear sputum. She does still feel more fatigued than usually but feels like she's slowly  building her stamina back up. Denies any fevers, chills, night sweats, hemoptysis, chest congestion, orthopnea, leg swelling. She continues to use her ipratropium neb for maintenance 3 times a day. Takes singulair at bedtime. Maintaining oxygen on 2-3 lpm.    OV 08/27/2022  Subjective:  Patient ID: Laurie Horton, female , DOB: 05-27-1946 , age 24 y.o. , MRN: [id number] , ADDRESS: [place] [id number] PCP Merrilee Seashore, MD Patient Care Team: Merrilee Seashore, MD as PCP - General (Internal Medicine) Martinique, Peter M, MD as PCP - Cardiology (Cardiology) Margaretha Sheffield, MD as Referring Physician (Physical Medicine and Rehabilitation) Laurence Spates, MD (Inactive) as Consulting Physician (Gastroenterology) Garvin Fila, MD as Consulting Physician (Neurology) [place], Peter M, MD as Consulting Physician (Cardiology) Brand Males, MD as Consulting Physician (Pulmonary Disease) Adele Schilder Arlyce Harman, MD as Consulting Physician (Psychiatry)  This Provider for this visit: Treatment Team:  Attending Provider: Brand Males, MD  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  She uses oxygen and nebulizer . Last CT chest MArch 2022  08/27/2022 -   Chief Complaint  Patient presents with   Follow-up    Follow-up visit PT here to discuss results of CT  scan Wants walk test to qualify for inogen      HPI DALAINA TATES 76 y.o. -returns for follow-up.  She states that she is now been approved by her [place] healthcare for In[employer] oxygen system because the regular oxygen system is heavy.  She needs a qualifying walk otherwise shortness of breath is at baseline.  She had CT scan of the chest without contrast.  The right middle lobe pneumonia is resolved.  No pulmonary fibrosis no lung cancer no lung nodule no pneumonia anymore.  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  She continues her ipratropium and Singulair.  She is refused all vaccines because of multiple drug allergies.  Otherwise no new issues  Social: Her granddaughter age 1 has new diagnosis of breast cancer and she is upset about it.  Simple office walk 185 feet x  3 laps goal with forehead probe 08/27/2022    O2 used ra   Number laps completed 1 of 3 laps   Comments about pace x   Resting Pulse Ox/HR 98% and 83/min   Final Pulse Ox/HR 99% and 98/min   Desaturated </= 88% yes   Desaturated <= 3% points yes   Got Tachycardic >/= 90/min yes   Symptoms at end of test Mod-severe dyspnea   Miscellaneous comments Needing 2L [place] to correct and do 1 lap      CT Chest data 08/18/22  Narrative & Impression  CLINICAL DATA:  Pneumonia, complication suspected, xray done   Community acquired pneumonia of right middle lobe.   EXAM: CT CHEST WITHOUT CONTRAST   TECHNIQUE: Multidetector CT imaging of the chest was performed following the standard protocol without IV contrast.   RADIATION DOSE REDUCTION: This exam was performed according to the departmental dose-optimization program which includes automated exposure control, adjustment of the mA and/or kV according to patient size and/or use of iterative reconstruction technique.   COMPARISON:  CT 05/29/2022   FINDINGS: Cardiovascular: Fusiform aneurysmal dilatation of the ascending aorta maximal dimension 4.1 cm, previously 4 cm. The thoracic aorta is densely calcified and tortuous. The origin of the left subclavian artery is densely calcified. Dilated main pulmonary artery at 3.5 cm. Upper normal heart size with dense coronary artery calcifications. No pericardial effusion.   Mediastinum/Nodes: Scattered small nonenlarged mediastinal lymph nodes, stable from prior exam and likely reactive. No suspicious mediastinal adenopathy. Assessment for hilar adenopathy is limited on this unenhanced exam. Unremarkable  appearance of the esophagus.   Lungs/Pleura: The previous right middle lobe airspace disease has resolved. No evidence of underlying mass. Moderate to advanced emphysema. Minor dependent atelectasis in the right lower lobe. No suspicious pulmonary nodule or mass. There is retained mucus within the right mainstem and lower lobe bronchus. No pleural fluid.   Upper Abdomen: No acute findings.   Musculoskeletal: Mild scoliosis with diffuse degenerative change in the thoracic spine. There are no acute or suspicious osseous abnormalities. No chest wall soft tissue abnormalities.   IMPRESSION: 1. Previous right middle lobe airspace disease has resolved. No residual or evidence of underlying mass. 2. 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 Recommend annual imaging followup by CTA or MRA. This recommendation follows 2010 ACCF/AHA/AATS/ACR/ASA/SCA/SCAI/SIR/STS/SVM Guidelines for the Diagnosis and Management of Patients with Thoracic Aortic Disease. Circulation. 2010; 121: B716-R678. Aortic aneurysm NOS (ICD10-I71.9) 3. Aortic atherosclerosis and coronary artery calcifications. 4. Dilated main pulmonary artery suggesting pulmonary arterial hypertension. 5. Moderate to advanced emphysema   Aortic Atherosclerosis (  ICD10-I70.0) and Emphysema (ICD10-J43.9).     Electronically Signed   By: Keith Rake M.D.   On: 08/19/2022 20:37      No results found.    PFT     Latest Ref Rng & Units 01/30/2021    8:51 AM 01/20/2016    9:35 AM 08/14/2013   10:17 AM  PFT Results  FVC-Pre L 2.61  2.79    FVC-Predicted Pre % 92  93  100   FVC-Post L   3.07   FVC-Predicted Post %   103   Pre FEV1/FVC % % 63  67  62   Post FEV1/FCV % %   64   FEV1-Pre L 1.63  1.86  1.84   FEV1-Predicted Pre % 77  82  82   FEV1-Post L   1.97   DLCO uncorrected ml/min/mmHg 9.45  9.75  11.57   DLCO UNC% % 49  40  50   DLCO corrected ml/min/mmHg 9.45      DLCO COR %Predicted % 49     DLVA Predicted % 57  51  58   TLC L 4.59  4.62  4.32   TLC % Predicted % 90  91  88   RV % Predicted % 80  78  77        has a past medical history of Acute diastolic heart failure, NYHA class 1 ([place]) (02/08/2017), Acute heart failure ([place]) (02/08/2017), Cataract, CHF (congestive heart failure) ([place]) (22/63/3354), Complication of anesthesia, COPD (chronic obstructive pulmonary disease) ([place]), Depression, Depression, Diverticulitis, DVT (deep venous thrombosis) (HCC), Fibromyalgia, GERD (gastroesophageal reflux disease), Heart murmur, Hiatal hernia, History of deviated nasal septum, HTN (hypertension), Hyperlipidemia, Hypertension, Hypertensive retinopathy, Myocardial infarction ([place]), Obesity, On supplemental oxygen therapy, Osteoporosis, Osteoporosis, Oxygen deficiency, PAT (paroxysmal atrial tachycardia), PFO (patent foramen ovale), PULMONARY NODULE, LEFT LOWER LOBE (10/14/2009), PVD (peripheral vascular disease) with claudication ([place]) (12/2017), Right middle lobe pneumonia (07/24/2011), Stroke ([place]), and TOBACCO ABUSE (06/04/2009).   reports that she quit smoking about 12 years ago. Her smoking use included cigarettes. She has a 90.00 pack-year smoking history. She has never used smokeless tobacco.  Past Surgical History:  Procedure Laterality Date   ABDOMINAL HYSTERECTOMY N/A    Phreesia 11/04/2020   CARDIOVASCULAR STRESS TEST  12/26/2004   EF 74%. NO EVIDENCE OF ISCHEMIA   CATARACT EXTRACTION Left    Dr. Elliot Dally   ESOPHAGOGASTRODUODENOSCOPY (EGD) WITH PROPOFOL N/A 04/15/2015   Procedure: ESOPHAGOGASTRODUODENOSCOPY (EGD) WITH PROPOFOL;  Surgeon: Laurence Spates, MD;  Location: [facility];  Service: Endoscopy;  Laterality: N/A;   EYE SURGERY Left    Cat Sx   JOINT REPLACEMENT N/A    Phreesia 11/04/2020   KNEE ARTHROSCOPY  2000   left   LAPAROSCOPIC CHOLECYSTECTOMY  04-16-2010   [facility]   LOWER EXTREMITY ANGIOGRAPHY N/A 09/09/2017   Procedure: Lower Extremity  Angiography;  Surgeon: Lorretta Harp, MD;  Location: [facility];  Service: Cardiovascular;  Laterality: N/A;   LOWER EXTREMITY INTERVENTION Left 01/17/2018   Procedure: LOWER EXTREMITY INTERVENTION;  Surgeon: Lorretta Harp, MD;  Location: [facility];  Service: Cardiovascular;  Laterality: Left;   MOUTH SURGERY     03-26-15 multiple extractions stitches remains   PERIPHERAL VASCULAR INTERVENTION Left 01/17/2018   Procedure: PERIPHERAL VASCULAR INTERVENTION;  Surgeon: Lorretta Harp, MD;  Location: [facility];  Service: Cardiovascular;  Laterality: Left;  COMMON ILIAC   RIGHT/LEFT HEART CATH AND CORONARY ANGIOGRAPHY N/A 03/04/2017   Procedure:  Right/Left Heart Cath and Coronary Angiography;  Surgeon: Peter M Martinique, MD;  Location: [facility];  Service: Cardiovascular;  Laterality: N/A;   TOTAL ABDOMINAL HYSTERECTOMY     post op needed oxygen was told "she gave them a scare"   TUBAL LIGATION     US ECHOCARDIOGRAPHY  11/20/2009   EF 55-60%    Allergies  Allergen Reactions   Alprazolam Anaphylaxis and Other (See Comments)    REACTION: stops breathing   Bee Venom Anaphylaxis   Iodine Anaphylaxis, Swelling and Other (See Comments)    REACTION: swelling in throat   Pseudoephedrine Hcl Er Shortness Of Breath   Budesonide-Formoterol Fumarate Other (See Comments)    Blisters inside of mouth all over   Crestor [Rosuvastatin Calcium] Other (See Comments)    Unable to walk   Esomeprazole Magnesium Other (See Comments)    REACTION: "bouncing off walls"   Flonase [Fluticasone Propionate] Other (See Comments)    NOSE BLEED   Lamictal [Lamotrigine] Rash    Patient got rash, labored breathing, and diarrhea   Loratadine Other (See Comments)    claritin D causes shaking   Lotrimin [Clotrimazole] Other (See Comments)    Mouth blisters   Lunesta [Eszopiclone] Other (See Comments)    REACTION: "slept for a week"   Oxcarbazepine Other (See Comments)    Causes deep  sleep and dizziness   Statins Other (See Comments)    Can't walk, legs won't work    Zolpidem Tartrate Other (See Comments)    REACTION: "slept for a week"   Betadine [Povidone Iodine] Other (See Comments)    Breathing problems   Bevespi Aerosphere [Glycopyrrolate-Formoterol] Other (See Comments)    Pt believes this caused mouth sores and thrush    Breztri Aerosphere [Budeson-Glycopyrrol-Formoterol] Other (See Comments)    Thrush   Clarithromycin Other (See Comments)    All "mycins", Puts into "a" fib, Will take if has to for severe sinus infection   Effexor [Venlafaxine] Nausea And Vomiting and Other (See Comments)    cramps   Lexapro [Escitalopram Oxalate] Other (See Comments)    hallucinations   Aciphex [Rabeprazole Sodium] Rash   Alendronate Sodium Other (See Comments)    "caused stomach problems for 3 days"   Avelox [Moxifloxacin Hcl In Nacl] Other (See Comments)    Stomach cramps.    Bextra [Valdecoxib] Rash   Ceclor [Cefaclor] Rash   Cephalexin Rash and Other (See Comments)    Pt states that she is possibly allergic to this - had a reaction to Cefaclor in the past and she does not want to these class drugs. Added per patient request.   Covera-Hs [Verapamil Hcl] Palpitations   Dicyclomine Hcl Rash   Other Other (See Comments)    Glue from ekg/heart monitor leads --rash, Any MYCINS   Tessalon Perles Rash    Immunization History  Administered Date(s) Administered   Influenza Split 08/03/2011, 08/01/2012   Influenza,inj,Quad PF,6+ Mos 08/14/2013, 09/03/2014, 09/04/2015, 08/18/2016   Influenza-Unspecified 11/03/1999   Pneumococcal Conjugate-13 10/01/2014   Pneumococcal Polysaccharide-23 09/03/1999, 06/02/2006, 08/14/2013   Td 11/02/1994   Tdap 08/18/2016    Family History  Problem Relation Age of Onset   Dementia Mother    Diabetes Mother    Alzheimer's disease Mother    Heart attack Brother 41   Heart attack Father    Schizophrenia Sister    Diabetes Sister     Tremor Sister      Current Outpatient Medications:    Alirocumab (  PRALUENT) 75 MG/ML SOAJ, INJECT 75 MG INTO THE SKIN EVERY 14 (FOURTEEN) DAYS., Disp: 2 mL, Rfl: 11   carvedilol (COREG) 12.5 MG tablet, Take 1 tablet (12.5 mg total) by mouth 2 (two) times daily., Disp: 180 tablet, Rfl: 3   clopidogrel (PLAVIX) 75 MG tablet, TAKE 1 TABLET BY MOUTH EVERY DAY, Disp: 30 tablet, Rfl: 11   desvenlafaxine (PRISTIQ) 50 MG 24 hr tablet, Take 1 tablet (50 mg total) by mouth daily., Disp: 30 tablet, Rfl: 2   diazepam (VALIUM) 2 MG tablet, Take by mouth., Disp: , Rfl:    diclofenac sodium (VOLTAREN) 1 % GEL, Apply 2 g topically 4 (four) times daily., Disp: , Rfl: 5   Eyelid Cleansers (OCUSOFT BABY EYELID & EYELASH EX), Apply topically., Disp: , Rfl:    furosemide (LASIX) 40 MG tablet, TAKE 1 TABLET BY MOUTH EVERY DAY (Patient taking differently: Take 40 mg by mouth daily.), Disp: 30 tablet, Rfl: 11   gabapentin (NEURONTIN) 100 MG capsule, Take 200 mg by mouth See admin instructions. Take 1 in am and 1 in the lunch and 2 at bedtime, Disp: , Rfl:    HYDROcodone-acetaminophen (NORCO/VICODIN) 5-325 MG tablet, Take 1 tablet by mouth 3 (three) times daily., Disp: , Rfl:    ipratropium (ATROVENT) 0.02 % nebulizer solution, Rolan Lipa, Disp: 62.5 mL, Rfl: 3   lidocaine (LIDODERM) 5 %, Place 1-3 patches onto the skin as directed., Disp: , Rfl:    montelukast (SINGULAIR) 10 MG tablet, TAKE 1 TABLET BY MOUTH EVERYDAY AT BEDTIME (Patient taking differently: Take 10 mg by mouth daily.), Disp: 90 tablet, Rfl: 2   Multiple Vitamins-Minerals (VITAMIN D3 COMPLETE PO), Take by mouth., Disp: , Rfl:    nitroGLYCERIN (NITROSTAT) 0.4 MG SL tablet, PLACE 1 TABLET UNDER THE TONGUE EVERY 5 MINUTES AS NEEDED FOR CHEST PAIN., Disp: 25 tablet, Rfl: 3   Omega-3 Fatty Acids (FISH OIL) 1000 MG CAPS, Take 1,000 mg by mouth daily., Disp: , Rfl:    ondansetron (ZOFRAN-ODT) 8 MG disintegrating tablet, Take 1 tablet (8 mg total) by mouth  every 8 (eight) hours as needed for nausea or vomiting., Disp: 20 tablet, Rfl: 0   OXYGEN, Inhale 1.5-3 L into the lungs as needed (for shortness of breath)., Disp: , Rfl:    pantoprazole (PROTONIX) 40 MG tablet, TAKE 1 TABLET BY MOUTH EVERY DAY, Disp: 30 tablet, Rfl: 5   polyethylene glycol (MIRALAX / GLYCOLAX) packet, Take 17 g by mouth daily as needed for mild constipation. , Disp: , Rfl:    spironolactone (ALDACTONE) 25 MG tablet, TAKE 1 TABLET BY MOUTH EVERY DAY, Disp: 30 tablet, Rfl: 11   traZODone (DESYREL) 100 MG tablet, Take one tab daily as needed for sleep, Disp: 30 tablet, Rfl: 2   triamcinolone cream (KENALOG) 0.1 %, Apply 1 application topically as needed (Yeast Infection)., Disp: , Rfl:    warfarin (COUMADIN) 5 MG tablet, TAKE 1/2 TO 1 TABLET BY MOUTH DAILY OR AS DIRECTED BY CLINIC, Disp: 30 tablet, Rfl: 3      Objective:   Vitals:   08/27/22 1013  BP: 116/82  Pulse: 77  Temp: 98.3 F (36.8 C)  TempSrc: Oral  SpO2: 97%  Weight: 181 lb 12.8 oz (82.5 kg)  Height: 5' 3" (1.6 m)    Estimated body mass index is 32.2 kg/m as calculated from the following:   Height as of this encounter: 5' 3" (1.6 m).   Weight as of this encounter: 181 lb 12.8 oz (82.5 kg).  @  Luan Pulling  Filed Weights   08/27/22 1013  Weight: 181 lb 12.8 oz (82.5 kg)     Physical Exam  General: No distress. Looks well Neuro: Alert and Oriented x 3. GCS 15. Speech normal Psych: Pleasant Resp:  Barrel Chest - yes.  Wheeze - no, Crackles - no, No overt respiratory distress CVS: Normal heart sounds. Murmurs - no Ext: Stigmata of Connective Tissue Disease - no HEENT: Normal upper airway. PEERL +. No post nasal drip        Assessment:       ICD-10-CM   1. Chronic respiratory failure with hypoxia (HCC)  J96.11     2. Vaccine counseling  Z71.85     3. Multiple drug allergies  Z88.9     4. History of recent pneumonia  Z87.01     5. Screening for lung cancer  Z12.2          Plan:      Patient Instructions     ICD-10-CM   1. Chronic respiratory failure with hypoxia (HCC)  J96.11     2. Vaccine counseling  Z71.85     3. Multiple drug allergies  Z88.9     4. History of recent pneumonia  Z87.01     5. Screening for lung cancer  Z12.2      Chronic respiratory failure with hypoxia (HCC) Emphysema  -Stable disease  Plan - Walking desaturation test for oxygen qualification today - Continue ipratropium 3 mL every 6 hours as needed for shortness of breath or wheezing. Use twice daily for maintenance  - Continue singulair 10 mg At bedtime -  - Continue supplemental oxygen 2-3 lpm with activity and at night. Goal oxygen >88-90%  - Continue Mucinex 600 mg Twice daily for chest congestion/cough  Vaccine counseling Multiple drug allergies  -Respect hesitancy towards COVID, RSV and flu vaccine  Plan - Monitor clinically  History of recent pneumonia Screening for lung cancer  -No evidence of pneumonia on CT scan of the chest October 2023.  No lung nodules no lung cancer.  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   Follow up in 4 months with Dr. [name] or sooner if needed    SIGNATURE    Dr. Brand Males, M.D., F.C.C.P,  Pulmonary and Critical Care Medicine Staff Physician, [facility] Director - Interstitial Lung Disease  Program  Pulmonary [place] [facility] [place], [place], [address]  Pager: [phone], If no answer or between  15:00h - 7:00h: call [phone] Telephone: [phone]  10:35 AM 08/27/2022

## 2022-08-27 NOTE — Addendum Note (Signed)
Addended by: Lorretta Harp on: 08/27/2022 11:07 AM   Modules accepted: Orders

## 2022-09-18 ENCOUNTER — Ambulatory Visit: Payer: Medicare Other | Attending: Cardiology

## 2022-09-18 DIAGNOSIS — Z7901 Long term (current) use of anticoagulants: Secondary | ICD-10-CM | POA: Diagnosis not present

## 2022-09-18 DIAGNOSIS — I480 Paroxysmal atrial fibrillation: Secondary | ICD-10-CM | POA: Diagnosis not present

## 2022-09-18 LAB — POCT INR: INR: 2.4 (ref 2.0–3.0)

## 2022-09-18 NOTE — Patient Instructions (Signed)
Description   Continue taking 1/2 tablet daily except 1 tablet on Wednesday.  Recheck INR in 6 weeks. [facility] [phone].

## 2022-10-08 NOTE — Progress Notes (Signed)
Cardiology Office Note    Date:  10/16/2022   ID:  [name], [name] [birth year], MRN [id number]  PCP:  Georgianne Fick, MD  Cardiologist: Dr. [place]   Chief Complaint  Patient presents with   Coronary Artery Disease   Congestive Heart Failure   Atrial Fibrillation     History of Present Illness:    Laurie Horton is a 76 y.o. female with past medical history of chronic diastolic CHF, PFO, PAF (on Coumadin), COPD (on 2L [place] at baseline), HTN, and prior CVA who is seen for follow up CHF and PAD.  She was admitted from 4/9 - 02/12/2017 for worsening dyspnea on exertion and palpitations. Was in atrial fibrillation with RVR upon arrival to the ED. Echo during admission showed a newly reduced EF of 20-25% and she was diuresed with IV Lasix. Enzymes were negative and EKG showed no acute ischemic changes. It was recommended to consider a right/left heart cath in 4-6 weeks.   She did undergo right and left heart cath on 03/04/17. This showed severe 2 vessel obstructive CAD with 100% RCA occlusion, 75% OM1, and 90% small OM2. EF 25-30%. Mild pulmonary HTN with normal LV filling pressures. 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 Maximizing CHF therapy recommended.  She did have repeat Echo in September 2018 showing improvement in EF to 50-55%.   Subsequent to this she developed significant claudication. She was seen by Dr. Allyson Sabal and had angiography showing 80% infrarenal aortic stenosis and left iliac stenosis. She also had severe right common femoral stenosis. She was seen by Dr Myra Gianotti for consideration of Aortobifemoral bypass. After pulmonary evaluation she was felt to be too high a risk for open surgery. In March 2019 she underwent atherectomy and covered stenting of the left iliac by Dr. Allyson Sabal. On follow up she did have improvement in her claudication and ABIs. The aortic and right common femoral artery stenoses are not felt to be amenable to percutaneous therapy.   In early July  2019 she was seen because she felt she was in Afib following dental procedure. On arrival she was in NSR with PACs. No medical changes made.   She has been followed by Dr Allyson Sabal. She was noted to have a high grade left subclavian stenosis but it was unclear that this was symptomatic and given all her medical problems was felt best to manage medically. She had left shoulder injection by Dr Cleophas Dunker for adhesive capsulitis. She notes this has helped with her pain significantly. She is followed by pulmonary for COPD.   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 INR had been therapeutic. At time of infarct it was 1.9. We decided to add plavix 75 mg daily due to her extensive vascular disease. MRI showed no acute infarct but she did have evidence of multiple old strokes.   She had PNA in Dec/Jan. Followed by Dr Colletta Maryland for COPD exacerbation. Had CT done. Aortic size at 4.1 cm.    Patient presented to the hospital on 12/06/2021 with palpitations, chest pain, she was found to be in A-fib with RVR.  Serial troponin went from 9 --> 24 --> 36.  While in the ED, patient converted to sinus rhythm spontaneously. She was seen by the cardiology fellow who felt that she was not in CHF. Echocardiogram obtained on the same day showed EF 55 to 60%, no regional wall motion abnormality, mild LVH, mild LAE. She  was discharged to follow-up with cardiology service as outpatient.  CT of the head obtained on 12/08/2021 was negative for acute intracranial abnormality.  Heart monitor obtained in February showed several episodes of A-fib and SVT, however A-fib burden was fairly low at 2%.  On follow up today she notes her breathing is worse especially when lying down. No increase in edema and weight is stable. Oxygen requirement has not changed. Reports her sugar and cholesterol spiked in October. Will request copy of labs. Still has pain in lower left sternum- worse when  active and change in position. Notes Afib a couple of times a week. Feels heart thumping. Lasts 5 minutes to rarely one hour.   Past Medical History:  Diagnosis Date   Acute diastolic heart failure, NYHA class 1 (HCC) 02/08/2017   Acute heart failure (HCC) 02/08/2017   Cataract    OD   CHF (congestive heart failure) (HCC) 03/02/2017   EF 25-30% 2018   Complication of anesthesia    various issues with oxygen  saturations post op   COPD (chronic obstructive pulmonary disease) (HCC)    Depression    Depression    [employer]reesia 11/04/2020   Diverticulitis    DVT (deep venous thrombosis) (HCC)    Fibromyalgia    GERD (gastroesophageal reflux disease)    Heart murmur    [employer]reesia 11/04/2020   Hiatal hernia    History of deviated nasal septum    left- side   HTN (hypertension)    Hyperlipidemia    Hypertension    [employer]reesia 11/04/2020   Hypertensive retinopathy    OU   Myocardial infarction (HCC)    [employer]reesia 11/04/2020   Obesity    On supplemental oxygen therapy    concentrator at night @ 1.5 l/m or when sleeps. O2 Sat niormally 87.   Osteoporosis    Osteoporosis    Phreesia 11/04/2020   Oxygen deficiency    [employer]reesia 11/04/2020   PAT (paroxysmal atrial tachycardia)    PFO (patent foramen ovale)    PULMONARY NODULE, LEFT LOWER LOBE 10/14/2009   5mm LLL nodule dec 2010. Stable and 4mm in Oct 2012. 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 Persists on cxr 07/22/11. Cleared on CT 08/24/11. No further followup   Stroke [facility])    TOBACCO ABUSE 06/04/2009    Past Surgical History:  Procedure Laterality Date   ABDOMINAL HYSTERECTOMY N/A    [employer]reesia 11/04/2020   CARDIOVASCULAR STRESS TEST  12/26/2004   EF 74%. NO EVIDENCE OF ISCHEMIA   CATARACT EXTRACTION Left    Dr. Hortense Ramal   ESOPHAGOGASTRODUODENOSCOPY (EGD) WITH PROPOFOL N/A 04/15/2015   Procedure: ESOPHAGOGASTRODUODENOSCOPY  (EGD) WITH PROPOFOL;  Surgeon: Carman Ching, MD;  Location: [facility] ENDOSCOPY;  Service: Endoscopy;  Laterality: N/A;   EYE SURGERY Left    Cat Sx   JOINT REPLACEMENT N/A    [employer]reesia 11/04/2020   KNEE ARTHROSCOPY  2000   left   LAPAROSCOPIC CHOLECYSTECTOMY  04-16-2010   [facility]   LOWER EXTREMITY ANGIOGRAPHY N/A 09/09/2017   Procedure: Lower Extremity Angiography;  Surgeon: Runell Gess, MD;  Location: [facility] INVASIVE CV LAB;  Service: Cardiovascular;  Laterality: N/A;   LOWER EXTREMITY INTERVENTION Left 01/17/2018   Procedure: LOWER EXTREMITY INTERVENTION;  Surgeon: Runell Gess, MD;  Location: [facility] INVASIVE CV LAB;  Service: Cardiovascular;  Laterality: Left;   MOUTH SURGERY  03-26-15 multiple extractions stitches remains   PERIPHERAL VASCULAR INTERVENTION Left 01/17/2018   Procedure: PERIPHERAL VASCULAR INTERVENTION;  Surgeon: Runell Gess, MD;  Location: [facility] INVASIVE CV LAB;  Service: Cardiovascular;  Laterality: Left;  COMMON ILIAC   RIGHT/LEFT HEART CATH AND CORONARY ANGIOGRAPHY N/A 03/04/2017   Procedure: Right/Left Heart Cath and Coronary Angiography;  Surgeon: Solyana Nonaka M [place], MD;  Location: [facility] INVASIVE CV LAB;  Service: Cardiovascular;  Laterality: N/A;   TOTAL ABDOMINAL HYSTERECTOMY     post op needed oxygen was told "she gave them a scare"   TUBAL LIGATION     US ECHOCARDIOGRAPHY  11/20/2009   EF 55-60%    Current Medications: Outpatient Medications Prior to Visit  Medication Sig Dispense Refill   Alirocumab (PRALUENT) 75 MG/ML SOAJ INJECT 75 MG INTO THE SKIN EVERY 14 (FOURTEEN) DAYS. 2 mL 11   carvedilol (COREG) 12.5 MG tablet Take 1 tablet (12.5 mg total) by mouth 2 (two) times daily. 180 tablet 3   clopidogrel (PLAVIX) 75 MG tablet TAKE 1 TABLET BY MOUTH EVERY DAY 30 tablet 11   desvenlafaxine (PRISTIQ) 50 MG 24 hr tablet Take 1 tablet (50 mg total) by mouth daily. 30 tablet 2   diazepam (VALIUM) 2 MG tablet Take by mouth. Take 5 pills a year.     diclofenac sodium  (VOLTAREN) 1 % GEL Apply 2 g topically 4 (four) times daily.  5   Eyelid Cleansers (OCUSOFT BABY EYELID & EYELASH EX) Apply topically.     furosemide (LASIX) 40 MG tablet TAKE 1 TABLET BY MOUTH EVERY DAY 30 tablet 11   gabapentin (NEURONTIN) 100 MG capsule Take 200 mg by mouth See admin instructions. Take 1 in am and 1 in the lunch and 2 at bedtime     HYDROcodone-acetaminophen (NORCO/VICODIN) 5-325 MG tablet Take 1 tablet by mouth in the morning and at bedtime.     ipratropium (ATROVENT) 0.02 % nebulizer solution [name] (Patient taking differently: No sig reported) 62.5 mL 3   lidocaine (LIDODERM) 5 % Place 1-3 patches onto the skin as directed.     montelukast (SINGULAIR) 10 MG tablet TAKE 1 TABLET BY MOUTH EVERYDAY AT BEDTIME (Patient taking differently: Take 10 mg by mouth daily.) 90 tablet 2   Multiple Vitamins-Minerals (VITAMIN D3 COMPLETE PO) Take by mouth.     nitroGLYCERIN (NITROSTAT) 0.4 MG SL tablet PLACE 1 TABLET UNDER THE TONGUE EVERY 5 MINUTES AS NEEDED FOR CHEST PAIN. 25 tablet 3   Omega-3 Fatty Acids (FISH OIL) 1000 MG CAPS Take 1,000 mg by mouth daily.     ondansetron (ZOFRAN-ODT) 8 MG disintegrating tablet Take 1 tablet (8 mg total) by mouth every 8 (eight) hours as needed for nausea or vomiting. 20 tablet 0   OXYGEN Inhale 1.5-3 L into the lungs as needed (for shortness of breath).     pantoprazole (PROTONIX) 40 MG tablet TAKE 1 TABLET BY MOUTH EVERY DAY 30 tablet 5   polyethylene glycol (MIRALAX / GLYCOLAX) packet Take 17 g by mouth daily as needed for mild constipation.      spironolactone (ALDACTONE) 25 MG tablet TAKE 1 TABLET BY MOUTH EVERY DAY 30 tablet 11   traZODone (DESYREL) 100 MG tablet Take one tab daily as needed for sleep 30 tablet 2   warfarin (COUMADIN) 5 MG tablet TAKE 1/2 TO 1 TABLET BY MOUTH DAILY OR AS DIRECTED BY CLINIC 30 tablet 3   triamcinolone cream (KENALOG) 0.1 % Apply 1 application topically as needed (Yeast Infection). (  Patient not taking:  Reported on 10/16/2022)     No facility-administered medications prior to visit.     Allergies:   Alprazolam, Bee venom, Iodine, Pseudoephedrine hcl er, Budesonide-formoterol fumarate, Crestor [rosuvastatin calcium], Esomeprazole magnesium, Flonase [fluticasone propionate], Lamictal [lamotrigine], Loratadine, Lotrimin [clotrimazole], Lunesta [eszopiclone], Oxcarbazepine, Statins, Zolpidem tartrate, Betadine [povidone iodine], Bevespi aerosphere [glycopyrrolate-formoterol], Breztri aerosphere [budeson-glycopyrrol-formoterol], Clarithromycin, Effexor [venlafaxine], Lexapro [escitalopram oxalate], Aciphex [rabeprazole sodium], Alendronate sodium, Avelox [moxifloxacin hcl in nacl], Bextra [valdecoxib], Ceclor [cefaclor], Cephalexin, Covera-hs [verapamil hcl], Dicyclomine hcl, Other, and Tessalon perles   Social History   Socioeconomic History   Marital status: Divorced    Spouse name: Not on file   Number of children: 2   Years of education: Not on file   Highest education level: Not on file  Occupational History   Occupation: disability  Tobacco Use   Smoking status: Former    Packs/day: 3.00    Years: 30.00    Total pack years: 90.00    Types: Cigarettes    Quit date: 06/02/2010    Years since quitting: 12.3   Smokeless tobacco: Never   Tobacco comments:    QUIT IN 2011  Vaping Use   Vaping Use: Never used  Substance and Sexual Activity   Alcohol use: No    Alcohol/week: 0.0 standard drinks of alcohol   Drug use: No   Sexual activity: Yes    Partners: Male    Birth control/protection: Post-menopausal, Surgical  Other Topics Concern   Not on file  Social History Narrative   Marital status: divorced in 1978 after ten years; dating casually in 2019.      Children: 3 biological children; 3 court appointed children; 6 grandchildren; 5 gg      Lives: alone; children in [place] and [place].      Employment: retired age 31; disability for COPD, CVA at age 26.      Tobacco: former  smoker; quit smoking 2011.      Alcohol: none      Exercise: walks dog four times per day; goes to pool three times per week.      ADLs: drives; no assistant devices; does have a walker.  Cleaning is limited in 2018.  Daughter helps with cleaning.  Does own grocery shopping.      Advanced Directives: YES; DNR/DNI; HCPOA: [name] Martin/daughter youngest.  Blind.               Social Determinants of Health   Financial Resource Strain: Not on file  Food Insecurity: Not on file  Transportation Needs: Not on file  Physical Activity: Not on file  Stress: Not on file  Social Connections: Not on file     Family History:  The patient's family history includes Alzheimer's disease in her mother; Dementia in her mother; Diabetes in her mother and sister; Heart attack in her father; Heart attack (age of onset: 30) in her brother; Schizophrenia in her sister; Tremor in her sister.   Review of Systems:   As noted in HPI.  All other systems reviewed and are otherwise negative except as noted above.   Physical Exam:    VS:  BP 136/76   Pulse 61   Ht 5\' 4"  (1.626 m)   Wt 181 lb (82.1 kg)   SpO2 97%   BMI 31.07 kg/m    GENERAL:  Well appearing overweight WF in NAD HEENT:  PERRL, EOMI, sclera are clear. Oropharynx is clear. NECK:  No jugular venous distention, carotid upstroke  brisk and symmetric, right carotid bruit,  left subclavian  bruit, no thyromegaly or adenopathy LUNGS:  Clear to auscultation bilaterally CHEST:  + scoliosis.  HEART:  RRR,  PMI not displaced or sustained,S1 and S2 within normal limits, no S3, no S4: no clicks, no rubs, no murmurs. She is very tender to palpation over lower sternum.  ABD:  Soft, nontender. BS +, no masses or bruits. No hepatomegaly, no splenomegaly EXT:  Poor pedal pulses, absent left radial pulse.  no edema, no cyanosis no clubbing SKIN:  Warm and dry.  No rashes NEURO:  Alert and oriented x 3. Cranial nerves II through XII intact. PSYCH:   Cognitively intact    Wt Readings from Last 3 Encounters:  10/16/22 181 lb (82.1 kg)  08/27/22 181 lb 12.8 oz (82.5 kg)  06/23/22 182 lb (82.6 kg)     Studies/Labs Reviewed:   Recent Labs: 12/05/2021: ALT 12; B Natriuretic Peptide 63.0; BUN 18; Creatinine, Ser 1.09; Potassium 3.7; Sodium 135 01/19/2022: Hemoglobin 12.0; Platelets 254   Lipid Panel    Component Value Date/Time   CHOL 132 07/02/2021 1017   CHOL 115 09/17/2020 0946   TRIG 220.0 (H) 07/02/2021 1017   HDL 41.80 07/02/2021 1017   HDL 36 (L) 09/17/2020 0946   CHOLHDL 3 07/02/2021 1017   VLDL 44.0 (H) 07/02/2021 1017   LDLCALC 52 09/17/2020 0946   LDLDIRECT 67.0 07/02/2021 1017  Dated 11/20/21: cholesterol 142, HDL 42,  Dated 03/23/22:  A1c 6.3%. triglycerides122   Additional studies/ records that were reviewed today include:   Echocardiogram: 02/09/2017 Study Conclusions   - Left ventricle: The cavity size was mildly dilated. Wall   thickness was increased in a pattern of mild LVH. Systolic   function was severely reduced. The estimated ejection fraction   was in the range of 20% to 25%. Diffuse hypokinesis. Doppler   parameters are consistent with abnormal left ventricular   relaxation (grade 1 diastolic dysfunction). - Aortic valve: Valve area (Vmax): 1.4 cm^2. - Aortic root: The aortic root was mildly dilated. - Ascending aorta: The ascending aorta was mildly dilated. - Mitral valve: Calcified annulus. There was moderate   regurgitation. - Left atrium: The atrium was moderately dilated. - Right ventricle: Systolic function was moderately reduced. - Pulmonary arteries: Systolic pressure was mildly increased. - Pericardium, extracardiac: A trivial pericardial effusion was   identified.   Impressions:   - No subcostal views; severe global reduction in LV systolic   function; grade 1 diastolic dysfunction; mildly dilated aortic   root and ascending aorta; moderate MR; moderaet LAE; moderately   reduced RV  function; mild TR; mildly elevated pulmonary pressure.  Procedures   Right/Left Heart Cath and Coronary Angiography  Conclusion     Ost 1st Mrg to 1st Mrg lesion, 75 %stenosed. 2nd Mrg lesion, 90 %stenosed. Ost RCA to Mid RCA lesion, 100 %stenosed. There is severe left ventricular systolic dysfunction. LV end diastolic pressure is normal. The left ventricular ejection fraction is 25-35% by visual estimate. Hemodynamic findings consistent with mild pulmonary hypertension. LV end diastolic pressure is normal.   1. Severe 2 vessel obstructive CAD    - 75% proximal OM1    - 90% small OM2    - 100% proximal RCA. Left to right collaterals.  2. Severe LV dysfunction 3. Mild pulmonary HTN with normal LV filling pressures.  4. Cardiac index 2.41 L/min/BSA     Plan: Medical management to try and optimize CHF therapy. Patient appears to be  adequately diuresed at this time. Based on these results her cardiomyopathy is ischemic. I would treat her CAD medically. If cardiac cath is needed in the future would consider alternative access given difficulty from the right radial approach. Her rhythm during procedure is a multifocal atrial rhythm/tachycardia.      Echo 07/08/17: Study Conclusions   - Left ventricle: The cavity size was normal. There was moderate   concentric hypertrophy. Systolic function was normal. The   estimated ejection fraction was in the range of 50% to 55%.   Severe hypokinesis of the basal-midinferior myocardium;   consistent with infarction in the distribution of the right   coronary artery. Doppler parameters are consistent with abnormal   left ventricular relaxation (grade 1 diastolic dysfunction). - Mitral valve: Calcified annulus.   Impressions:   - Compared to April 2018 there is marked improvement in contraction   of all LV wall segemnts except for the inferior wall, which   remains severely hypokinetic. There is also marked reduction in   the severity of mitral  insufficiency.    Echo 12/06/2021  1. Left ventricular ejection fraction, by estimation, is 55 to 60%. The  left ventricle has normal function. The left ventricle has no regional  wall motion abnormalities. There is mild left ventricular hypertrophy.  Left ventricular diastolic parameters  were normal.   2. Right ventricular systolic function is normal. The right ventricular  size is normal.   3. Left atrial size was mildly dilated.   4. The mitral valve is normal in structure. No evidence of mitral valve  regurgitation. No evidence of mitral stenosis.   5. The aortic valve is tricuspid. There is mild calcification of the  aortic valve. There is mild thickening of the aortic valve. Aortic valve  regurgitation is not visualized. Aortic valve sclerosis is present, with  no evidence of aortic valve stenosis.   6. The inferior vena cava is normal in size with greater than 50%  respiratory variability, suggesting right atrial pressure of 3 mmHg.     Monitor 01/02/22: Study Highlights    Normal sinus rhythm Atrial fibrillation with RVR on 12/19/21 and 12/21/21 with longest episode lasting 5 hours and 40 minutes and average HR 120 bpm. AFib burden 2% Frequent PACs with few short bursts of SVT longest lasting 15 beats Occasional PVCs with rare NSVT longest lasting 5 beats. Symptoms appear to correlate with Afib an SVT.     Patch Wear Time:  13 days and 23 hours (2023-02-10T10:12:14-0500 to 2023-02-24T09:21:34-0500)   Patient had a min HR of 43 bpm, max HR of 235 bpm, and avg HR of 71 bpm. Predominant underlying rhythm was Sinus Rhythm. First Degree AV Block was present. 2 Ventricular Tachycardia runs occurred, the run with the fastest interval lasting 5 beats with a  max rate of 235 bpm (avg 156 bpm); the run with the fastest interval was also the longest. 7 Supraventricular Tachycardia runs occurred, the run with the fastest interval lasting 4 beats with a max rate of 176 bpm, the longest  lasting 15 beats with an  avg rate of 128 bpm. Some episodes of Supraventricular Tachycardia may be possible Atrial Tachycardia with variable block. Atrial Fibrillation occurred (2% burden), ranging from 75-167 bpm (avg of 120 bpm), the longest lasting 5 hours 40 mins with an avg  rate of 116 bpm. Supraventricular Tachycardia and Atrial Fibrillation were detected within +/- 45 seconds of symptomatic patient event(s). Isolated SVEs were frequent (11.5%, [id number]), SVE Couplets were frequent (  5.5%, [id number]), and SVE Triplets were  occasional (1.7%, 8129). Isolated VEs were occasional (4.5%, [id number]), VE Couplets were rare (<1.0%, 596), and VE Triplets were rare (<1.0%, 41). Ventricular Bigeminy and Trigeminy were present.  Assessment:    1. Coronary artery disease of native artery of native heart with stable angina pectoris (HCC)   2. PAF (paroxysmal atrial fibrillation) (HCC)   3. Long term current use of anticoagulant therapy   4. Chronic obstructive pulmonary disease, unspecified COPD type (HCC)   5. Chronic systolic CHF (congestive heart failure) (HCC)   6. Hyperlipidemia LDL goal <100         Plan:    1. Chronic Combined Systolic and Diastolic CHF/  Secondary to ischemic/tachycardia mediated Cardiomyopathy - EF 25-30% in 2018 with ischemic cardiomyopathy. Not a candidate for revascularization. With medical management EF improved to 50-55% by Echo. Last done in Feb 2023 - she is euvolemic at this time. Weight is stable.   - continue BB, aldactone, and statin. ARB discontinued due to orthostatic dizziness. - if sugars high would consider adding an SGLT2 inhibitor.   2. Paroxysmal Atrial Fibrillation/ multifocal atrial tachycardia - This patients CHA2DS2-VASc Score and unadjusted Ischemic Stroke Rate (% per year) is equal to 9.7 % stroke rate/year from a score of 6 (CHF, HTN, Female, Age, CVA (2)). She denies any evidence of active bleeding. Continue Coumadin for anticoagulation. On Coreg for  rate control. On event monitor burden was low 2%.   3. HTN- controlled.   4. COPD/Emphysema- - followed by Pulmonology. On oxygen  5. PFO - no plans for closure   6. PAD s/p covered stenting of left iliac. Stenosis in distal aorta and right femoral not amenable to percutaneous therapy. Left subclavian stenosis. Claudication is stable.   7. CAD. 100% RCA. 75% OM. Chronic stable angina class 1-2.  No active angina. Rarely uses Ntg. Current pain is clearly musculoskeletal on exam  8. S/p right retinal artery occlusion. Plavix added to coumadin due to severe vascular disease. I feel benefit of Plavix/coumadin combination outweighs risk of bleeding at this time.  9. Old CVAs noted on MRI  10. Hypercholesterolemia. Now on Praluent. Requested a copy of her more recent labs with PCP  11. Carotid arterial disease. No new Neurologic symptoms. Dopplers in January 2023 were unchanged.   12. 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 Reassured her about this. She is not a candidate for aortic surgery  13. Musculoskeletal rib pain.   I will follow up in 6 months   Signed, Moe Graca [place], MD  10/16/2022 8:23 AM    [facility] [address], [address] [place], [place] [address] Phone: [phone]

## 2022-10-13 ENCOUNTER — Other Ambulatory Visit: Payer: Self-pay | Admitting: Cardiology

## 2022-10-16 ENCOUNTER — Ambulatory Visit: Payer: Medicare Other | Attending: Cardiology | Admitting: Cardiology

## 2022-10-16 ENCOUNTER — Encounter: Payer: Self-pay | Admitting: Cardiology

## 2022-10-16 VITALS — BP 136/76 | HR 61 | Ht 64.0 in | Wt 181.0 lb

## 2022-10-16 DIAGNOSIS — I5022 Chronic systolic (congestive) heart failure: Secondary | ICD-10-CM

## 2022-10-16 DIAGNOSIS — Z7901 Long term (current) use of anticoagulants: Secondary | ICD-10-CM

## 2022-10-16 DIAGNOSIS — J449 Chronic obstructive pulmonary disease, unspecified: Secondary | ICD-10-CM | POA: Diagnosis not present

## 2022-10-16 DIAGNOSIS — E785 Hyperlipidemia, unspecified: Secondary | ICD-10-CM

## 2022-10-16 DIAGNOSIS — I480 Paroxysmal atrial fibrillation: Secondary | ICD-10-CM | POA: Diagnosis not present

## 2022-10-16 DIAGNOSIS — I25118 Atherosclerotic heart disease of native coronary artery with other forms of angina pectoris: Secondary | ICD-10-CM | POA: Diagnosis not present

## 2022-10-23 ENCOUNTER — Encounter: Payer: Self-pay | Admitting: Internal Medicine

## 2022-10-23 DIAGNOSIS — J9611 Chronic respiratory failure with hypoxia: Secondary | ICD-10-CM

## 2022-10-23 MED ORDER — IPRATROPIUM BROMIDE 0.02 % IN SOLN: 0.2500 mg | RESPIRATORY_TRACT | 5 refills | Status: DC | PRN

## 2022-10-30 ENCOUNTER — Ambulatory Visit: Payer: Medicare Other | Attending: Cardiology

## 2022-10-30 DIAGNOSIS — I480 Paroxysmal atrial fibrillation: Secondary | ICD-10-CM | POA: Diagnosis not present

## 2022-10-30 DIAGNOSIS — Z5181 Encounter for therapeutic drug level monitoring: Secondary | ICD-10-CM

## 2022-10-30 DIAGNOSIS — Z7901 Long term (current) use of anticoagulants: Secondary | ICD-10-CM | POA: Diagnosis not present

## 2022-10-30 LAB — POCT INR: INR: 1.7 — AB (ref 2.0–3.0)

## 2022-10-30 NOTE — Patient Instructions (Signed)
TAKE 1 TABLET TODAY ONLY AND THEN Continue taking 1/2 tablet daily except 1 tablet on Wednesday.  Recheck INR in 3 weeks. Coumadin [facility] [phone].

## 2022-11-10 ENCOUNTER — Other Ambulatory Visit: Payer: Self-pay | Admitting: Cardiology

## 2022-11-10 DIAGNOSIS — I480 Paroxysmal atrial fibrillation: Secondary | ICD-10-CM

## 2022-11-17 ENCOUNTER — Other Ambulatory Visit (HOSPITAL_COMMUNITY): Payer: Self-pay | Admitting: Psychiatry

## 2022-11-17 DIAGNOSIS — F331 Major depressive disorder, recurrent, moderate: Secondary | ICD-10-CM

## 2022-11-18 ENCOUNTER — Other Ambulatory Visit (HOSPITAL_COMMUNITY): Payer: Self-pay | Admitting: *Deleted

## 2022-11-18 DIAGNOSIS — F331 Major depressive disorder, recurrent, moderate: Secondary | ICD-10-CM

## 2022-11-18 MED ORDER — TRAZODONE HCL 100 MG PO TABS: ORAL_TABLET | ORAL | 0 refills | Status: DC

## 2022-11-19 ENCOUNTER — Ambulatory Visit: Payer: Medicare Other

## 2022-11-20 ENCOUNTER — Telehealth (HOSPITAL_BASED_OUTPATIENT_CLINIC_OR_DEPARTMENT_OTHER): Payer: Medicare Other | Admitting: Psychiatry

## 2022-11-20 ENCOUNTER — Other Ambulatory Visit (HOSPITAL_COMMUNITY): Payer: Self-pay

## 2022-11-20 ENCOUNTER — Encounter (HOSPITAL_COMMUNITY): Payer: Self-pay | Admitting: Psychiatry

## 2022-11-20 DIAGNOSIS — F411 Generalized anxiety disorder: Secondary | ICD-10-CM

## 2022-11-20 DIAGNOSIS — F331 Major depressive disorder, recurrent, moderate: Secondary | ICD-10-CM

## 2022-11-20 MED ORDER — DESVENLAFAXINE SUCCINATE ER 25 MG PO TB24: 25.0000 mg | ORAL_TABLET | Freq: Every day | ORAL | 0 refills | Status: DC

## 2022-11-20 MED ORDER — TRAZODONE HCL 100 MG PO TABS: ORAL_TABLET | ORAL | 0 refills | Status: DC

## 2022-11-20 NOTE — Progress Notes (Signed)
Virtual Visit via Telephone Note  I connected with Laurie Horton on 11/20/22 at  8:20 AM EST by telephone and verified that I am speaking with the correct person using two identifiers.  Location: Patient: Home Provider: Home Office   I discussed the limitations, risks, security and privacy concerns of performing an evaluation and management service by telephone and the availability of in person appointments. I also discussed with the patient that there may be a patient responsible charge related to this service. The patient expressed understanding and agreed to proceed.   History of Present Illness: Patient is evaluated by phone session.  She is taking medication but noticed Pristiq causing nausea and dizziness and sometimes she feels tired and go to sleep.  Laurie Horton taking the morning she tried to take in the afternoon but is still struggling with the side effects.  She like to try a different option.  Overall she feels her anxiety depression is much better.  She was very sick around Christmas but after the Christmas she had a good time with the family.  She was able to see the new great grandchildren's who were born a few months ago.  Patient told now she has 10 grandchildren.  Patient has no contact with her son for more than 30 years but patient keep a close contact with grandchildren.  Patient also reported some contact with the older daughter but she usually make effort on her terms.  Her younger daughter who is legally blind living [place] and she was able to see her with the husband and family.  Patient sleeping good with the help of trazodone.  She still have issues with short-term memory, attention concentration and she has appointment with neurologist Dr. Leonie Horton in March.  Her PCP is Dr. Ashby Horton at [facility].  Patient told she used to take Zofran to help the nausea but lately has not been filled and wondering if she can either change Pristiq to a different medication or  prescribe Zofran.  Patient also taking pain medicine, blood pressure medicine, blood thinner.  Her appetite is okay.  Her weight is stable.  She has no tremors.  She denies any mania, psychosis, hallucination, feeling of hopelessness, worthlessness or suicidal thoughts.  Past Psychiatric History:  H/O depression and anxiety. No H/O inpatient treatment, suicidal attempt, paranoia, mania and hallucination. Seeing psychiatrist in 2006 after a stroke.  Tried Cymbalta, lexapro and Celexa with limited response.  Tried Ambien, Lunesta, lexapro, Lamictal and Xanax but developed allergies and side effects.  Tried higher Pristiq but has side effects. Valium helped in past.  Psychiatric Specialty Exam: Physical Exam  Review of Systems  Gastrointestinal:  Positive for nausea.  Allergic/Immunologic: Positive for environmental allergies.    Weight 181 lb (82.1 kg).Body mass index is 31.07 kg/m.  General Appearance: NA  Eye Contact:  NA  Speech:  Slow  Volume:  Normal  Mood:  Anxious  Affect:  NA  Thought Process:  Goal Directed  Orientation:  Full (Time, Place, and Person)  Thought Content:  Logical  Suicidal Thoughts:  No  Homicidal Thoughts:  No  Memory:  Immediate;   Good Recent;   Fair Remote;   Fair  Judgement:  Intact  Insight:  Shallow  Psychomotor Activity:  NA  Concentration:  Concentration: Fair and Attention Span: Fair  Recall:  [employer] of Knowledge:  Good  Language:  Good  Akathisia:  No  Handed:  Right  AIMS (if indicated):  Assets:  Communication Skills Desire for Improvement Housing  ADL's:  Intact  Cognition:  WNL  Sleep:   ok     Assessment and Plan: Major depressive disorder, recurrent.  Generalized anxiety disorder.  Discussed nausea, dizziness and feeling tired with Pristiq.  Patient is taking Pristiq for a while and so far she did not have any concerns until lately.  I recommend she need to contact her PCP for Zofran if needed.  I also discussed one  possibility is to try mirtazapine which she has never tried before or cut down the Pristiq 25 mg if that helps.  I also discussed that patient has multiple health issues including A-fib, CHF, COPD and taking multiple medication that could be the reason she is feeling nauseous and dizziness.  I recommend moving the Pristiq at nighttime is another option.  After some discussion patient agreed to cut down the dose of Pristiq 25 mg and if that did not work then she will contact her PCP to see if any of the medication making her nauseous.  I encouraged to keep appointment with Dr. Leonie Horton in March and if patient continues to have symptoms which is related to Pristiq then we will consider mirtazapine.  Patient agreed with the plan.  I recommend to call us back if she has any question or any concern.  We will follow-up in 4 weeks.  For now continue trazodone 100 mg at bedtime but try Pristiq 25 mg only and she can take at nighttime.   Follow Up Instructions:    I discussed the assessment and treatment plan with the patient. The patient was provided an opportunity to ask questions and all were answered. The patient agreed with the plan and demonstrated an understanding of the instructions.   The patient was advised to call back or seek an in-person evaluation if the symptoms worsen or if the condition fails to improve as anticipated.  Collaboration of Care: Other provider involved in patient's care AEB notes are available in epic to review.  Patient/Guardian was advised Release of Information must be obtained prior to any record release in order to collaborate their care with an outside provider. Patient/Guardian was advised if they have not already done so to contact the registration department to sign all necessary forms in order for [place] to release information regarding their care.   Consent: Patient/Guardian gives verbal consent for treatment and assignment of benefits for services provided during this visit.  Patient/Guardian expressed understanding and agreed to proceed.    I provided 22 minutes of non-face-to-face time during this encounter.   Kathlee Nations, MD

## 2022-11-25 ENCOUNTER — Ambulatory Visit: Payer: Medicare Other | Attending: Cardiovascular Disease

## 2022-11-25 DIAGNOSIS — I480 Paroxysmal atrial fibrillation: Secondary | ICD-10-CM

## 2022-11-25 DIAGNOSIS — Z7901 Long term (current) use of anticoagulants: Secondary | ICD-10-CM

## 2022-11-25 LAB — POCT INR: INR: 1.4 — AB (ref 2.0–3.0)

## 2022-11-25 NOTE — Patient Instructions (Signed)
Description   Take 1 1/2 tablets today and then START taking 1/2 tablet daily except 1 tablet on Wednesday and Fridays.  Recheck INR in 2 weeks.  [facility] [phone].

## 2022-11-27 ENCOUNTER — Ambulatory Visit (HOSPITAL_COMMUNITY)
Admission: RE | Admit: 2022-11-27 | Discharge: 2022-11-27 | Disposition: A | Discharge status: Home / Self Care | Payer: Medicare Other | Source: Ambulatory Visit | Attending: Cardiovascular Disease | Admitting: Cardiovascular Disease

## 2022-11-27 DIAGNOSIS — I6523 Occlusion and stenosis of bilateral carotid arteries: Secondary | ICD-10-CM | POA: Diagnosis present

## 2022-11-27 DIAGNOSIS — G458 Other transient cerebral ischemic attacks and related syndromes: Secondary | ICD-10-CM | POA: Diagnosis not present

## 2022-12-07 ENCOUNTER — Ambulatory Visit: Payer: Medicare Other | Attending: Cardiology

## 2022-12-07 DIAGNOSIS — Z5181 Encounter for therapeutic drug level monitoring: Secondary | ICD-10-CM

## 2022-12-07 DIAGNOSIS — Z7901 Long term (current) use of anticoagulants: Secondary | ICD-10-CM | POA: Diagnosis not present

## 2022-12-07 DIAGNOSIS — I480 Paroxysmal atrial fibrillation: Secondary | ICD-10-CM | POA: Diagnosis not present

## 2022-12-07 LAB — POCT INR: INR: 2.3 (ref 2.0–3.0)

## 2022-12-07 NOTE — Patient Instructions (Signed)
Continue taking 1/2 tablet daily except 1 tablet on Wednesday and Fridays.  Recheck INR in 4 weeks.  [facility] [phone].

## 2022-12-17 ENCOUNTER — Other Ambulatory Visit (HOSPITAL_COMMUNITY): Payer: Self-pay | Admitting: Psychiatry

## 2022-12-17 DIAGNOSIS — F331 Major depressive disorder, recurrent, moderate: Secondary | ICD-10-CM

## 2022-12-17 DIAGNOSIS — F411 Generalized anxiety disorder: Secondary | ICD-10-CM

## 2022-12-21 ENCOUNTER — Other Ambulatory Visit (HOSPITAL_COMMUNITY): Payer: Self-pay | Admitting: *Deleted

## 2022-12-21 ENCOUNTER — Telehealth (HOSPITAL_COMMUNITY): Payer: Medicare Other | Admitting: Psychiatry

## 2022-12-21 DIAGNOSIS — F331 Major depressive disorder, recurrent, moderate: Secondary | ICD-10-CM

## 2022-12-21 DIAGNOSIS — F411 Generalized anxiety disorder: Secondary | ICD-10-CM

## 2022-12-21 MED ORDER — DESVENLAFAXINE SUCCINATE ER 25 MG PO TB24: 25.0000 mg | ORAL_TABLET | Freq: Every day | ORAL | 0 refills | Status: DC

## 2022-12-21 MED ORDER — TRAZODONE HCL 100 MG PO TABS: ORAL_TABLET | ORAL | 0 refills | Status: DC

## 2022-12-24 ENCOUNTER — Telehealth (HOSPITAL_BASED_OUTPATIENT_CLINIC_OR_DEPARTMENT_OTHER): Payer: Medicare Other | Admitting: Psychiatry

## 2022-12-24 ENCOUNTER — Encounter (HOSPITAL_COMMUNITY): Payer: Self-pay | Admitting: Psychiatry

## 2022-12-24 DIAGNOSIS — F411 Generalized anxiety disorder: Secondary | ICD-10-CM | POA: Diagnosis not present

## 2022-12-24 DIAGNOSIS — F331 Major depressive disorder, recurrent, moderate: Secondary | ICD-10-CM | POA: Diagnosis not present

## 2022-12-24 MED ORDER — TRAZODONE HCL 100 MG PO TABS: ORAL_TABLET | ORAL | 2 refills | Status: DC

## 2022-12-24 MED ORDER — DESVENLAFAXINE SUCCINATE ER 25 MG PO TB24: 25.0000 mg | ORAL_TABLET | Freq: Every day | ORAL | 2 refills | Status: DC

## 2022-12-24 NOTE — Progress Notes (Signed)
[facility] MD Virtual Progress Note   Patient Location: Home Provider Location: Home Office  I connect with patient by telephone and verified that I am speaking with correct person by using two identifiers. I discussed the limitations of evaluation and management by telemedicine and the availability of in person appointments. I also discussed with the patient that there may be a patient responsible charge related to this service. The patient expressed understanding and agreed to proceed.  Laurie Horton [id number] 77 y.o.  12/24/2022 8:22 AM    History of Present Illness:  Patient is evaluated by phone session.  She reported being sick few weeks ago but now getting better.  She tried cutting down the Pristiq 25 mg and so far she is tolerating well.  She has no more dizziness, nausea, balance issue.  However she is sad because no one called on her birthday.  However she denies any hallucination, paranoia or any suicidal thoughts.  She admitted some time having dreams and in that dream she see hurting animals but believes it was only one time when she watch a movie.  Overall she sleeps okay.  She decided not to send gifts on family birthdays.  She is disappointed because no one called to sign anything on her birthday.  Her granddaughter brought her great grand baby but she was not able to hold because she was sick.  She is no longer taking Zofran.  She like to keep the Pristiq 25 mg since she is tolerating well and does not have any worsening of depression.  She also like to keep the trazodone 100 mg at bedtime.  Past Psychiatric History:  H/O depression and anxiety. No H/O inpatient treatment, suicidal attempt, paranoia, mania and hallucination. Seeing psychiatrist in 2006 after a stroke.  Tried Cymbalta, lexapro and Celexa with limited response.  Tried Ambien, Lunesta, lexapro, Lamictal and Xanax but developed allergies and side effects.  Tried higher Pristiq but has side  effects. Valium helped in past.   Outpatient Encounter Medications as of 12/24/2022  Medication Sig   Alirocumab (PRALUENT) 75 MG/ML SOAJ INJECT 75 MG INTO THE SKIN EVERY 14 (FOURTEEN) DAYS.   carvedilol (COREG) 12.5 MG tablet Take 1 tablet (12.5 mg total) by mouth 2 (two) times daily.   clopidogrel (PLAVIX) 75 MG tablet TAKE 1 TABLET BY MOUTH EVERY DAY   desvenlafaxine (PRISTIQ) 25 MG 24 hr tablet Take 1 tablet (25 mg total) by mouth daily.   diazepam (VALIUM) 2 MG tablet Take by mouth. Take 5 pills a year.   diclofenac sodium (VOLTAREN) 1 % GEL Apply 2 g topically 4 (four) times daily.   Eyelid Cleansers (OCUSOFT BABY EYELID & EYELASH EX) Apply topically.   furosemide (LASIX) 40 MG tablet TAKE 1 TABLET BY MOUTH EVERY DAY   gabapentin (NEURONTIN) 100 MG capsule Take 200 mg by mouth See admin instructions. Take 1 in am and 1 in the lunch and 2 at bedtime   HYDROcodone-acetaminophen (NORCO/VICODIN) 5-325 MG tablet Take 1 tablet by mouth in the morning and at bedtime.   ipratropium (ATROVENT) 0.02 % nebulizer solution Take 1.25 mLs (0.25 mg total) by nebulization every 4 (four) hours as needed for wheezing or shortness of breath. Rolan Lipa   lidocaine (LIDODERM) 5 % Place 1-3 patches onto the skin as directed.   montelukast (SINGULAIR) 10 MG tablet TAKE 1 TABLET BY MOUTH EVERYDAY AT BEDTIME (Patient taking differently: Take 10 mg by mouth daily.)   nitroGLYCERIN (NITROSTAT) 0.4 MG SL tablet  PLACE 1 TABLET UNDER THE TONGUE EVERY 5 MINUTES AS NEEDED FOR CHEST PAIN.   ondansetron (ZOFRAN-ODT) 8 MG disintegrating tablet Take 1 tablet (8 mg total) by mouth every 8 (eight) hours as needed for nausea or vomiting.   OXYGEN Inhale 1.5-3 L into the lungs as needed (for shortness of breath).   pantoprazole (PROTONIX) 40 MG tablet TAKE 1 TABLET BY MOUTH EVERY DAY   polyethylene glycol (MIRALAX / GLYCOLAX) packet Take 17 g by mouth daily as needed for mild constipation.    spironolactone (ALDACTONE) 25  MG tablet TAKE 1 TABLET BY MOUTH EVERY DAY   traZODone (DESYREL) 100 MG tablet Take one tab daily as needed for sleep   warfarin (COUMADIN) 5 MG tablet TAKE 1/2 TO 1 TABLET BY MOUTH DAILY OR AS DIRECTED BY CLINIC   No facility-administered encounter medications on file as of 12/24/2022.    Recent Results (from the past 2160 hour(s))  POCT INR     Status: Abnormal   Collection Time: 10/30/22  7:49 AM  Result Value Ref Range   INR 1.7 (A) 2.0 - 3.0   POC INR    POCT INR     Status: Abnormal   Collection Time: 11/25/22  8:03 AM  Result Value Ref Range   INR 1.4 (A) 2.0 - 3.0   POC INR    POCT INR     Status: None   Collection Time: 12/07/22  8:17 AM  Result Value Ref Range   INR 2.3 2.0 - 3.0   POC INR       Psychiatric Specialty Exam: Physical Exam  Review of Systems  Weight 181 lb (82.1 kg).There is no height or weight on file to calculate BMI.  General Appearance: NA  Eye Contact:  NA  Speech:  Normal Rate  Volume:  Normal  Mood:  Dysphoric  Affect:  NA  Thought Process:  Descriptions of Associations: Intact  Orientation:  Full (Time, Place, and Person)  Thought Content:  Rumination  Suicidal Thoughts:  No  Homicidal Thoughts:  No  Memory:  Immediate;   Good Recent;   Fair Remote;   Fair  Judgement:  Intact  Insight:  Present  Psychomotor Activity:  NA  Concentration:  Concentration: Fair and Attention Span: Fair  Recall:  [place] of Knowledge:  Good  Language:  Good  Akathisia:  No  Handed:  Right  AIMS (if indicated):     Assets:  Communication Skills Desire for Improvement Housing  ADL's:  Intact  Cognition:  WNL  Sleep:  ok     Assessment/Plan: MDD (major depressive disorder), recurrent episode, moderate (HCC) - Plan: traZODone (DESYREL) 100 MG tablet, desvenlafaxine (PRISTIQ) 25 MG 24 hr tablet  Generalized anxiety disorder - Plan: desvenlafaxine (PRISTIQ) 25 MG 24 hr tablet  Patient doing better since Pristiq does decrease.  Continue 25 mg  daily and trazodone 100 mg at bedtime.  She has no more nausea, dizziness or falls.  Recommended to call us back if she has any question or any concern.  Patient has appointment coming up with neurology on March next week.  Patient is not interested in therapy.  We will follow-up in 3 months.    Follow Up Instructions:     I discussed the assessment and treatment plan with the patient. The patient was provided an opportunity to ask questions and all were answered. The patient agreed with the plan and demonstrated an understanding of the instructions.   The patient was  advised to call back or seek an in-person evaluation if the symptoms worsen or if the condition fails to improve as anticipated.    Collaboration of Care: Other provider involved in patient's care AEB notes are available in epic to review.  Patient/Guardian was advised Release of Information must be obtained prior to any record release in order to collaborate their care with an outside provider. Patient/Guardian was advised if they have not already done so to contact the registration department to sign all necessary forms in order for [place] to release information regarding their care.   Consent: Patient/Guardian gives verbal consent for treatment and assignment of benefits for services provided during this visit. Patient/Guardian expressed understanding and agreed to proceed.     I provided 18 minutes of non face to face time during this encounter.  Kathlee Nations, MD 12/24/2022

## 2023-01-04 ENCOUNTER — Ambulatory Visit: Payer: Medicare Other | Attending: Cardiovascular Disease | Admitting: *Deleted

## 2023-01-04 DIAGNOSIS — I480 Paroxysmal atrial fibrillation: Secondary | ICD-10-CM | POA: Diagnosis not present

## 2023-01-04 DIAGNOSIS — Z5181 Encounter for therapeutic drug level monitoring: Secondary | ICD-10-CM | POA: Diagnosis not present

## 2023-01-04 DIAGNOSIS — Z7901 Long term (current) use of anticoagulants: Secondary | ICD-10-CM | POA: Diagnosis not present

## 2023-01-04 LAB — POCT INR: POC INR: 2.9

## 2023-01-04 NOTE — Patient Instructions (Addendum)
Description   While on flagyl and fluconazole; This week take 1/2 a tablet of warfarin daily, eat extra serving of greens this week.  On Friday (3/8) go back to your normal dose of warfarin 1/2 tablet daily except 1 tablet on Wednesday and Fridays.  Recheck INR in 1  week. ( Typically 4 weeks) [facility] [phone].

## 2023-01-06 ENCOUNTER — Ambulatory Visit (INDEPENDENT_AMBULATORY_CARE_PROVIDER_SITE_OTHER): Payer: Medicare Other | Admitting: Neurology

## 2023-01-06 ENCOUNTER — Encounter: Payer: Self-pay | Admitting: Neurology

## 2023-01-06 VITALS — BP 128/56 | HR 55 | Ht 64.0 in | Wt 181.0 lb

## 2023-01-06 DIAGNOSIS — I480 Paroxysmal atrial fibrillation: Secondary | ICD-10-CM | POA: Diagnosis not present

## 2023-01-06 DIAGNOSIS — G3184 Mild cognitive impairment, so stated: Secondary | ICD-10-CM | POA: Diagnosis not present

## 2023-01-06 DIAGNOSIS — H3411 Central retinal artery occlusion, right eye: Secondary | ICD-10-CM

## 2023-01-06 NOTE — Patient Instructions (Addendum)
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    I advised her to continue warfarin for stroke prevention given history of atrial fibrillation and maintain aggressive risk factor modification with strict control of hypertension with blood pressure goal below 130/90, lipids with LDL cholesterol goal below 70 mg percent and diabetes with hemoglobin A1c goal below 6.5%.  She is also encouraged to eat a healthy diet and to be active and lose weight.  I also encouraged her to increase participation in cognitively challenging activities like solving crossword puzzles, playing bridge and sodoku.  .We also discussed memory compensation strategies.  Patient may also consider possible participation in the  [place] AF trial ( eliquis versus factor 11 inhibitor Milvexian ) if interested.  She will be given written information to take home to review and discuss with her family and call us if he is wants to participate.  Will also discussed this with her cardiologist Dr. Martinique to get his opinion as well.  Return for follow-up in 12 months or call earlier if necessary.

## 2023-01-06 NOTE — Progress Notes (Signed)
[facility] [address] [place]. [place] [address] [phone]       OFFICE FOLLOW UP VISIT NOTE  Laurie Horton Date of Birth:  Aug 14, 1946 Medical Record Number:  [id number]   Referring MD: Laurie Horton  Reason for Referral: Right eye vision loss and memory loss HPI: Initial consult 12/25/2019 : [name] is a 77 year old pleasant Caucasian lady seen today for initial office consultation visit.  History is obtained from the patient, review of electronic medical records and I personally reviewed imaging films in PACS.  Patient states she woke up on 11/21/2019 with sudden onset of painless right eye vision loss.  She had no problems with vision in the left eye.  She denied any accompanying headache or burning discomfort in the eye.  She was seen by ophthalmologist Dr. Parke Horton who referred her to Dr. Coralyn Horton who diagnosed central retinal artery occlusion.  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  MRI of the brain showed no large vessel stenosis and only mild atheromatous irregularities and moderate stenosis of left distal vertebral artery.  MRA of the neck showed severe stenosis of left subclavian artery and 50% stenosis of right internal carotid artery.  Patient has history of chronic A. fib and is on anticoagulation with warfarin.  She had a carotid ultrasound on 08/07/2019 which showed 40 to 50% left ICA and 1 to 39% right ICA stenosis.  LDL cholesterol on 11/13/2019 was 99 mg percent and hemoglobin A1c on 03/14/2019 was 6.3.  Patient states right eye vision has not improved.  She has had no further stroke or TIA symptoms.  She however states she has noticed memory difficulties which are ongoing for about a year or so.  These are mostly involving short-term memory.  She has 2 take  notes otherwise she forgets.  She is still independent in managing most of her affairs however she is finding of memory difficulties and knowing.  She has no family stable dementia.  She has no prior history of significant head injury with loss of consciousness, seizures or prior known strokes or neurological problems. Update 05/01/2020 ; She returns for follow-up after last visit 4 months ago.  She states that she has had no further recurrent episodes of TIA or stroke.  She is tolerating warfarin and Plavix well without bruising or bleeding.  She continues to have short-term memory difficulties and has trouble at times remembering her medicines.  She initially started doing compensation strategies like keeping a journal but after a month or so realized she has trouble writing as it hurts her eyes and stopped doing so.  She has however been doing brain games online and finds them useful.  She underwent EEG on 01/03/2020 which showed moderate generalized slowing without epileptiform activity.  Lab work for reversible causes of memory loss on 12/25/2019 was all normal.  She has recently started using fish oil for her eyes.  She has no new complaints.  She feels her cognitive difficulties are unchanged Update 01/06/2023 : Patient returns for follow-up today upon request from her psychiatrist Dr. Adele Horton.  She continues to have mild cognitive difficulties with these are more or less unchanged.  She has poor short-term memory and often forgets what she started.  She has left the stove on a couple of times and blindfold.  However she has no safety issues major accidents.  She continues to live alone and is independent in all activities of daily living.  I prescribed Prevagen at last visit which she took only for a few months and it did not help and hence she stopped it.  She does do mentally challenging activities like solving crossword puzzles and playing board games regularly.  He remains on warfarin which is tolerating well  but is tired of doing regular blood checks.  Her INR tends to fluctuate.  She appears interested in participating in the [place] AF study (eliquis versus factor XI inhibitor Milvexian ) and was given information to review at home and decide in the past with family and her cardiologist Dr. Martinique.  On cognitive testing he did quite well and scored 30/30 on Mini-Mental control meds.  Clock drawing score was 4/4 ROS:   14 system review of systems is positive for vision loss, memory loss, cognitive difficulties, bruising and all other systems negative  PMH:  Past Medical History:  Diagnosis Date   Acute diastolic heart failure, NYHA class 1 (HCC) 02/08/2017   Acute heart failure ([place]) 02/08/2017   Cataract    OD   CHF (congestive heart failure) ([place]) 03/02/2017   EF [id number]   Complication of anesthesia    various issues with oxygen  saturations post op   COPD (chronic obstructive pulmonary disease) ([place])    Depression    Depression    Phreesia 11/04/2020   Diverticulitis    DVT (deep venous thrombosis) (HCC)    Fibromyalgia    GERD (gastroesophageal reflux disease)    Heart murmur    Phreesia 11/04/2020   Hiatal hernia    History of deviated nasal septum    left- side   HTN (hypertension)    Hyperlipidemia    Hypertension    Phreesia 11/04/2020   Hypertensive retinopathy    OU   Myocardial infarction ([place])    Phreesia 11/04/2020   Obesity    On supplemental oxygen therapy    concentrator at night @ 1.5 l/m or when sleeps. O2 Sat niormally 87.   Osteoporosis    Osteoporosis    Phreesia 11/04/2020   Oxygen deficiency    Phreesia 11/04/2020   PAT (paroxysmal atrial tachycardia)    PFO (patent foramen ovale)    PULMONARY NODULE, LEFT LOWER LOBE 10/14/2009   35m LLL nodule dec 2010. Stable and 481min Oct 2012. No further fu   PVD (peripheral vascular disease) with [place]audication ([place]   Right middle lobe pneumonia 07/24/2011   First noted at admit 07/10/11. Persists on  cxr 07/22/11. Cleared on CT 08/24/11. No further followup   [facility]   TOBACCO ABUSE 06/04/2009    Social History:  Social History   Socioeconomic History   Marital status: Divorced    Spouse name: Not on file   Number of children: 2   Years of education: Not on file   Highest education level: Not on file  Occupational History   Occupation: disability  Tobacco Use   Smoking status: Former    Packs/day: 3.00    Years: 30.00    Total pack years: 90.00    Types: Cigarettes    Quit date: 06/02/2010    Years since quitting: 12.6   Smokeless tobacco: Never   Tobacco comments:    QUIT IN 2011  Vaping Use   Vaping Use: Never used  Substance and Sexual Activity   Alcohol use: No  Alcohol/week: 0.0 standard drinks of alcohol   Drug use: No   Sexual activity: Yes    Partners: Male    Birth control/protection: Post-menopausal, Surgical  Other Topics Concern   Not on file  Social History Narrative   Marital status: divorced in 1978 after ten years; dating casually in 2019.      Children: 3 biological children; 3 court appointed children; 6 grandchildren; 5 gg      Lives: alone; children in [place] and [place].      Employment: retired age 24; disability for COPD, CVA at age 25.      Tobacco: former smoker; quit smoking 2011.      Alcohol: none      Exercise: walks dog four times per day; goes to pool three times per week.      ADLs: drives; no assistant devices; does have a walker.  Cleaning is limited in 2018.  Daughter helps with cleaning.  Does own grocery shopping.      Advanced Directives: YES; DNR/DNI; HCPOA: Rolan Lipa Martin/daughter youngest.  Blind.               Social Determinants of Health   Financial Resource Strain: Not on file  Food Insecurity: Not on file  Transportation Needs: Not on file  Physical Activity: Not on file  Stress: Not on file  Social Connections: Not on file  Intimate Partner Violence: Not on file    Medications:   Current  Outpatient Medications on File Prior to Visit  Medication Sig Dispense Refill   Alirocumab (PRALUENT) 75 MG/ML SOAJ INJECT 75 MG INTO THE SKIN EVERY 14 (FOURTEEN) DAYS. 2 mL 11   carvedilol (COREG) 12.5 MG tablet Take 1 tablet (12.5 mg total) by mouth 2 (two) times daily. 180 tablet 3   clopidogrel (PLAVIX) 75 MG tablet TAKE 1 TABLET BY MOUTH EVERY DAY 30 tablet 11   desvenlafaxine (PRISTIQ) 25 MG 24 hr tablet Take 1 tablet (25 mg total) by mouth daily. 30 tablet 2   diclofenac sodium (VOLTAREN) 1 % GEL Apply 2 g topically 4 (four) times daily.  5   Eyelid Cleansers (OCUSOFT BABY EYELID & EYELASH EX) Apply topically.     furosemide (LASIX) 40 MG tablet TAKE 1 TABLET BY MOUTH EVERY DAY 30 tablet 11   gabapentin (NEURONTIN) 100 MG capsule Take 200 mg by mouth See admin instructions. Take 1 in am and 1 in the lunch and 2 at bedtime     HYDROcodone-acetaminophen (NORCO/VICODIN) 5-325 MG tablet Take 1 tablet by mouth in the morning and at bedtime.     ipratropium (ATROVENT) 0.02 % nebulizer solution Take 1.25 mLs (0.25 mg total) by nebulization every 4 (four) hours as needed for wheezing or shortness of breath. Rolan Lipa 62.5 mL 5   lidocaine (LIDODERM) 5 % Place 1-3 patches onto the skin as directed.     montelukast (SINGULAIR) 10 MG tablet TAKE 1 TABLET BY MOUTH EVERYDAY AT BEDTIME (Patient taking differently: Take 10 mg by mouth daily.) 90 tablet 2   nitroGLYCERIN (NITROSTAT) 0.4 MG SL tablet PLACE 1 TABLET UNDER THE TONGUE EVERY 5 MINUTES AS NEEDED FOR CHEST PAIN. 25 tablet 3   ondansetron (ZOFRAN-ODT) 8 MG disintegrating tablet Take 1 tablet (8 mg total) by mouth every 8 (eight) hours as needed for nausea or vomiting. 20 tablet 0   OXYGEN Inhale 1.5-3 L into the lungs as needed (for shortness of breath).     pantoprazole (PROTONIX) 40 MG tablet TAKE 1  TABLET BY MOUTH EVERY DAY 30 tablet 5   polyethylene glycol (MIRALAX / GLYCOLAX) packet Take 17 g by mouth daily as needed for mild  constipation.      spironolactone (ALDACTONE) 25 MG tablet TAKE 1 TABLET BY MOUTH EVERY DAY 30 tablet 11   traZODone (DESYREL) 100 MG tablet Take one tab daily as needed for sleep 30 tablet 2   warfarin (COUMADIN) 5 MG tablet TAKE 1/2 TO 1 TABLET BY MOUTH DAILY OR AS DIRECTED BY CLINIC 45 tablet 1   No current facility-administered medications on file prior to visit.    Allergies:   Allergies  Allergen Reactions   Alprazolam Anaphylaxis and Other (See Comments)    REACTION: stops breathing   Bee Venom Anaphylaxis   Iodine Anaphylaxis, Swelling and Other (See Comments)    REACTION: swelling in throat   Pseudoephedrine Hcl Er Shortness Of Breath   Budesonide-Formoterol Fumarate Other (See Comments)    Blisters inside of mouth all over   Crestor [Rosuvastatin Calcium] Other (See Comments)    Unable to walk   Esomeprazole Magnesium Other (See Comments)    REACTION: "bouncing off walls"   Flonase [Fluticasone Propionate] Other (See Comments)    NOSE BLEED   Lamictal [Lamotrigine] Rash    Patient got rash, labored breathing, and diarrhea   Loratadine Other (See Comments)    claritin D causes shaking   Lotrimin [Clotrimazole] Other (See Comments)    Mouth blisters   Lunesta [Eszopiclone] Other (See Comments)    REACTION: "slept for a week"   Oxcarbazepine Other (See Comments)    Causes deep sleep and dizziness   Statins Other (See Comments)    Can't walk, legs won't work    Zolpidem Tartrate Other (See Comments)    REACTION: "slept for a week"   Betadine [Povidone Iodine] Other (See Comments)    Breathing problems   Bevespi Aerosphere [Glycopyrrolate-Formoterol] Other (See Comments)    Pt believes this caused mouth sores and thrush    Breztri Aerosphere [Budeson-Glycopyrrol-Formoterol] Other (See Comments)    Thrush   Clarithromycin Other (See Comments)    All "mycins", Puts into "a" fib, Will take if has to for severe sinus infection   Effexor [Venlafaxine] Nausea And  Vomiting and Other (See Comments)    cramps   Lexapro [Escitalopram Oxalate] Other (See Comments)    hallucinations   Aciphex [Rabeprazole Sodium] Rash   Alendronate Sodium Other (See Comments)    "caused stomach problems for 3 days"   Avelox [Moxifloxacin Hcl In Nacl] Other (See Comments)    Stomach cramps.    Bextra [Valdecoxib] Rash   Ceclor [Cefaclor] Rash   Cephalexin Rash and Other (See Comments)    Pt states that she is possibly allergic to this - had a reaction to Cefaclor in the past and she does not want to these class drugs. Added per patient request.   Covera-Hs [Verapamil Hcl] Palpitations   Dicyclomine Hcl Rash   Other Other (See Comments)    Glue from ekg/heart monitor leads --rash, Any MYCINS   Tessalon Perles Rash    Physical Exam General: Mildly obese elderly Caucasian lady, seated, in no evident distress.  She is on home oxygen. Head: head normocephalic and atraumatic.   Neck: supple with soft right carotid bruit.   Cardiovascular: regular rate and rhythm, no murmurs Musculoskeletal: no deformity Skin:  no rash/petichiae Vascular:  Normal pulses all extremities  Neurologic Exam Mental Status: Awake and fully alert. Oriented to place and  time. Recent and remote memory intact. Attention span, concentration and fund of knowledge appropriate. Mood and affect appropriate.    recall 3/3.  Able to name 15 animals which can walk on 4 legs.  Clock drawing 4/4.  Calculation and naming intact Cranial Nerves: Fundoscopic exam difficult to do through undilated right pupil is 3 mm sluggishly reactive left pupil is 2 mm briskly reactive.. Extraocular movements full without nystagmus but slight exotropia of right eye.. Visual fields full to confrontation in left eye.  Right eye had no light perception and she is legally blind.  In the right eye.  Hearing intact. Facial sensation intact. Face, tongue, palate moves normally and symmetrically.  Motor: Normal bulk and tone. Normal  strength in all tested extremity muscles. Sensory.:  Slight diminished touch pinprick sensation left hemibody.  Position vibration intact. Coordination: Rapid alternating movements normal in all extremities. Finger-to-nose and heel-to-shin performed accurately bilaterally. Gait and Station: Arises from chair without difficulty. Stance is normal. Gait demonstrates normal stride length and balance .  Not able to heel, toe and tandem walk .  Reflexes: 1+ and symmetric. Toes downgoing.      ASSESSMENT: 77 year old lady with sudden onset of painless right eye vision loss in January 2021 secondary to central retinal artery occlusion.  Also mild cognitive and memory difficulties due to age-appropriate mild cognitive impairment which appears stable.  Vascular risk factors of atrial fibrillation, hypertension, hyperlipidemia, peripheral vascular disease and cerebrovascular disease     PLAN: I had a long discussion the patient regarding her remote episode of right eye vision loss due to central retinal artery occlusion as well as memory and cognitive difficulties due to mild cognitive impairment and answered questions.    I advised her to continue warfarin for stroke prevention given history of atrial fibrillation and maintain aggressive risk factor modification with strict control of hypertension with blood pressure goal below 130/90, lipids with LDL cholesterol goal below 70 mg percent and diabetes with hemoglobin A1c goal below 6.5%.  She is also encouraged to eat a healthy diet and to be active and lose weight.  I also encouraged her to increase participation in cognitively challenging activities like solving crossword puzzles, playing bridge and sodoku.  .We also discussed memory compensation strategies.  Patient may also consider possible participation in the  [place] AF trial ( eliquis versus factor 11 inhibitor Milvexian ) if interested.  She will be given written information to take home to review and  discuss with her family and call us if he is wants to participate.  Will also discussed this with her cardiologist Dr. Martinique to get his opinion as well.  Return for follow-up in 12 months or call earlier if necessary.  Greater than 50% time during this 35-minute  visit were spent on counseling and coordination of care about retinal artery occlusion and mild cognitive impairment and answering questions. Antony Contras, MD  [facility] [address] [place], [place] [address]  Phone [phone] Fax [phone] Note: This document was prepared with digital dictation and possible smart phrase technology. Any transcriptional errors that result from this process are unintentional.

## 2023-01-11 ENCOUNTER — Telehealth: Payer: Self-pay | Admitting: Cardiology

## 2023-01-11 ENCOUNTER — Ambulatory Visit: Payer: Medicare Other | Attending: Internal Medicine

## 2023-01-11 DIAGNOSIS — Z7901 Long term (current) use of anticoagulants: Secondary | ICD-10-CM | POA: Diagnosis not present

## 2023-01-11 DIAGNOSIS — I480 Paroxysmal atrial fibrillation: Secondary | ICD-10-CM | POA: Diagnosis not present

## 2023-01-11 LAB — PROTIME-INR
INR: 7.6 (ref 0.9–1.2)
Prothrombin Time: 68.9 s — ABNORMAL HIGH (ref 9.1–12.0)

## 2023-01-11 NOTE — Telephone Encounter (Signed)
Called pt to provide Warfarin doing instruction . Please refer to anticoagulation encounter.

## 2023-01-11 NOTE — Telephone Encounter (Signed)
Critical results: INR 7.6

## 2023-01-11 NOTE — Patient Instructions (Signed)
Description   Called pt and instructed to HOLD Warfarin today, tomorrow, and Wednesday. Seek immediate medical attention is signs/symptoms of bleeding occur.  Normal dose of warfarin: 1/2 tablet daily except 1 tablet on Wednesday and Fridays.  Recheck INR on Thursday, 01/14/23 ( Typically 4 weeks) [facility] [phone].

## 2023-01-11 NOTE — Telephone Encounter (Signed)
Critical lab results

## 2023-01-13 ENCOUNTER — Other Ambulatory Visit: Payer: Self-pay | Admitting: Nurse Practitioner

## 2023-01-13 ENCOUNTER — Other Ambulatory Visit (HOSPITAL_COMMUNITY): Payer: Self-pay | Admitting: Psychiatry

## 2023-01-13 ENCOUNTER — Other Ambulatory Visit: Payer: Self-pay | Admitting: Family Medicine

## 2023-01-13 DIAGNOSIS — F331 Major depressive disorder, recurrent, moderate: Secondary | ICD-10-CM

## 2023-01-13 DIAGNOSIS — B370 Candidal stomatitis: Secondary | ICD-10-CM

## 2023-01-13 DIAGNOSIS — F411 Generalized anxiety disorder: Secondary | ICD-10-CM

## 2023-01-14 ENCOUNTER — Ambulatory Visit: Payer: Medicare Other | Attending: Cardiology

## 2023-01-14 DIAGNOSIS — Z7901 Long term (current) use of anticoagulants: Secondary | ICD-10-CM

## 2023-01-14 DIAGNOSIS — I480 Paroxysmal atrial fibrillation: Secondary | ICD-10-CM

## 2023-01-14 LAB — POCT INR: INR: 2.1 (ref 2.0–3.0)

## 2023-01-14 NOTE — Patient Instructions (Signed)
Description   Resume taking 1/2 tablet daily except 1 tablet on Wednesday and Fridays.  Recheck INR in 1 week ( Typically 4 weeks) [facility] [phone].

## 2023-01-15 ENCOUNTER — Encounter: Payer: Self-pay | Admitting: Internal Medicine

## 2023-01-15 ENCOUNTER — Ambulatory Visit (INDEPENDENT_AMBULATORY_CARE_PROVIDER_SITE_OTHER): Payer: Medicare Other | Admitting: Internal Medicine

## 2023-01-15 VITALS — BP 120/74 | HR 81 | Temp 98.2°F | Ht 64.0 in | Wt 180.0 lb

## 2023-01-15 DIAGNOSIS — Z122 Encounter for screening for malignant neoplasm of respiratory organs: Secondary | ICD-10-CM | POA: Diagnosis not present

## 2023-01-15 DIAGNOSIS — Z889 Allergy status to unspecified drugs, medicaments and biological substances status: Secondary | ICD-10-CM

## 2023-01-15 DIAGNOSIS — J449 Chronic obstructive pulmonary disease, unspecified: Secondary | ICD-10-CM | POA: Diagnosis not present

## 2023-01-15 DIAGNOSIS — J9611 Chronic respiratory failure with hypoxia: Secondary | ICD-10-CM | POA: Diagnosis not present

## 2023-01-15 NOTE — Progress Notes (Signed)
OV 05/19/2016  Chief Complaint  Patient presents with   Follow-up    Pt c/o worsening SOB, prod cough with thick white mucus.      This is a routine follow-up. Last visit was in April 2017 with nurse practitioner. At that time treated for COPD exacerbation according to her history but review of the chart does not show that to be true. At this point in time she says COPD stable although she says she might be in flare up on account of her fibromyalgia. Starting her symptoms out it appears that it is generally stable with dyspnea at baseline and cough with mild sputum at baseline. She is more hobbled by her chronic pain and fibromyalgia. Couple months ago apparently a hydrocodone was discontinued by rheumatology and therefore she is having "withdrawal". She does have a new pain medication physician. She is on gabapentin for fibromyalgia. There are no other new issues. She does not want antibiotics or prednisone for her perceived exacerbation  OV 11/17/2016  Chief Complaint  Patient presents with   Follow-up    Pt states her SOB has worsened since last OV. Pt states she has a burning in her chest when she becomes SOB. Pt c/o prod cough with white mucus in morning, cough becomes nonprod throughout the day.     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 Last CT chest March 2017 without any mass and associated mild cor pulmonale   Six-month follow-up visit. She is completely overwhelmed by her fibromyalgia and depression. The fibromyalgia is worse. She says she is change doctors because of this. She's had a few admissions in the interim but none of them give a COPD exacerbation according to chart review. She suffered frustrated by her heels oxygen system even though it is light it is causing her pain. She uses Atrovent nebulizer but wants change to something else but at the same time has rejected use of any other  nebulizer or oral inhaler because of side effects. She is burning chest pain with inspiration and associated with her costochondral junction trigger points. 2014 review of the chart shows normal cardiac stress test. November 2017 chest x-ray is clear. She does not want any further imaging. Chest pain is mild to severe and variable. Worsened with inspiration. No radiation associated wheezing. No sputum production  OV 05/25/2017  Chief Complaint  Patient presents with   Follow-up    Pt states her SOB has worsened since last OV in 11/2016. Pt states she only coughs after her neb treatment - pt states her mucus is yellow in color and c/o occ chest discomfort. Pt denies f/c/s.     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 Last CT chest March 2017 without any mass and associated mild cor pulmonale   Follow-up exertional hypoxemia associated with his emphysema. Also associated with fibromyalgia. Last visit she had atypical chest pain. Recommended she see cardiology. 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 According to her history she has significant coronary artery disease but is fairly advanced. She is frustrated with this but realistic. She feels her days are number. In terms of COPD emphysema and is stable. She is on Atrovent inhalers. She is on oxygen. She wants a lighter system. She still burden by fibromyalgia. 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  She uses oxygen and nebulizers.  She is not interested in rehab or vaccines.  New issue: Preoperative pulmonary evaluation.  She is having significant bilateral lower extremity claudication.  She states that she is in severe pain walking from her door to the mailbox to the point she is almost crying.  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  Apparently Dr. Trula Slade wants to try a laparotomy approach.  However Dr. Broadus John might take her to the cardiac Cath Lab and placed stents.  She says she is sensitive to fentanyl and is worried about anesthesia complications but the pain is so severe she is willing to take the risk.  She has had previous cardiac catheterization without any problems other than being sensitive to fentanyl.  She says this can be done in the cardiac Cath Lab with anesthesia support.  She wants me to talk to Dr. Betsy Coder and Dr. Trula Slade about this.   OV 07/18/2018  Subjective:  Patient ID: Laurie Horton, female , DOB: [birth year] , age 72 y.o. , MRN: [id number] , ADDRESS: [address] [place] [address]   07/18/2018 -   Chief Complaint  Patient presents with   Follow-up    Pt states her chest is hurting her all the time now and states she does not think her neb solutions are working for her anymore now. Pt also states she has had some worsening SOB and is also coughing up white phlegm which is comes out in chunks.     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  She uses oxygen and nebulizers. Last CT chest March 2017 without any mass and associated mild cor pulmonale   HPI Laurie Horton 77 y.o. -after last visit she saw  nurse practitioner in June 2019 fora mild respiratory flare. At the time treated with allergy medications antihistamines.She tells me thathe last saw me January 2019 she had an iliac stent place in the left side and after that her effort tolerance is improved but in the last few months she's noticed a decrease in effort tolerance with worsening dyspnea and also increased cough and increased sputum production in volume and also consistency without change in color This no fever or weight loss. She will not have a flu shot because of prior allergy. Her last CT scan of the chest was in 2017 and she is requesting for another one. Her inhaler as ipratropium which she says she's not happy with. In the past she's uses Spiriva and Symbicort and these have caused blisters and so she is generally where you have inhalers although she wants something other than her current one.       08/19/2018  - Visit   [name].  Patient was last seen in our office visit on  07/18/2018 started on Bevespi.  Patient reports that after 1 day of use of the Bevespi inhaler she developed thrush and mouth sores.  She she contacted our office to be treated with nystatin.  Patient reports that most mouth sores resolved there remains one.  Patient is also having extensive dental work done.  Patient also has been treated for scabies as well as impetigo by dermatology recently.   Patient completed a high-res CT in 07/29/2018 that showed no real changes from baseline.  Still showing severe emphysema.   Patient reports that she continues to use her Atrovent nebulizer but is wondering if there is any other options available for her.  Patient feels that the Atrovent nebulizer is not working as well.  Patient is currently using as needed and not using it scheduled.  MMRC - Breathlessness Score 3 - I stop for breath after walking about 100 yards or after a few minutes on level ground (isle at grocery  store is 159f)  Patient reports that she has known triggers of shortness of breath with exertion, when there is high pollen counts, when she is walking, when she is outside for extended periods of time.  Patient reports she is been using 2 L via nasal cannula of oxygen with exertion as well as at rest.  Patient reports she forgot her POC at home.  She arrived to our office on room air.  Oxygen saturations 87.  Patient refused oxygen in our office and states that she does not think she needs oxygen right now.  Patient is sad and concerned regarding her limitations with vascular surgery.  Patient believes that she needs a surgery but reports that vascular team as well as cardiology does not think that she would be a good surgical candidate.  She reports that she has not heard back from Dr. BNaida Sleightoffice regarding her most recent test results.   Tests:  01/21/2016-CT chest without contrast- moderate centrilobular emphysema and diffuse bronchial wall thickening mild subpleural density in the dependent lower lobes  01/20/2016-pulmonary function test- airway obstruction and diffusion defect suggesting emphysema  Imaging:  02/11/2017-chest x-ray-stable large cardiac silhouette, lungs are hyperinflated, interstitial edema pattern unchanged from prior scans   Cardiac:  07/08/2017-echocardiogram-LV ejection fraction 50 to 596% grade 1 diastolic dysfunction      OV 12/21/2018  Subjective:  Patient ID: KArmen Horton female , DOB: [birth year], age 77y.o. 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  She uses oxygen and nebulizers. Last CT chest March 2017 without any mass and associated mild cor pulmonale  12/21/2018 -   Chief Complaint  Patient presents with   Follow-up    Pt states due to being switched to a  new medication, she has had labored breathing. SOB is with exertion, has an occ cough with white phlegm, and also has had some occ CP.     HPI KKIRSTI MCALPINE89y.o. -  Presents for routine follow-up.  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  She uses oxygen sporadically and nebulizer sporadically.  She is very afraid of medicines because of multiple allergies.  She says her psychiatrist Dr. AMelissa Montaneplaced her on some medications that caused a rash.  Other than that she is okay.      OV  09/15/2019  Subjective:  Patient ID: Laurie Horton, female , DOB: 03/22/1946 , age 39 y.o. , MRN: [id number] , ADDRESS: [address] 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  She uses oxygen and nebulizers. Last CT chest March 2017 without any mass and associated mild cor pulmonale   09/15/2019 -  No chief complaint on file.    HPI Laurie Horton 77 y.o. -     ROS - per HPI     OV 01/17/2020  Subjective:  Patient ID: Laurie Horton, female , DOB: 09/12/1946 , age 77 y.o. , MRN: [id number] , ADDRESS: [address] [place] [address]   01/17/2020 -   Chief Complaint  Patient presents with   Follow-up     HPI Laurie Horton 77 y.o. -presents for face-to-face follow-up.  In December she called with a COPD flareup symptoms.  She wanted Biaxin despite her allergies.  She feels that Biaxin helps her.  She says she was given prednisone which helped but she was only given generic clarithromycin instead of the tradename Biaxin.  She says it did not work.  She wants tradename Biaxin only.  She says since then her cough is more than baseline.  She also is like sputum that is discolored.  She feels like she is in exacerbation and this is preventing her from getting the COVID-19  vaccine.  She feels another course of tradename Biaxin is required.  She again does not want generic clarithromycin.  She is compliant with her baseline nebulizer nighttime oxygen.  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  She is now recovering from that.  She talked about Covid vaccine.  She wants to get it.  He has had pneumonia vaccine without problem but recently flu shot she feels this put her in the hospital.  She also has multiple oral drug allergies.  Overall she continues to mask and follow social distancing.       CAT COPD Symptom & Quality of Life Score (GSK trademark) 0 is no burden. 5 is highest burden 07/18/2018  12/21/2018  01/17/2020   Never Cough -> Cough all the time '3 3 3  ' No phlegm in chest -> Chest is full of phlegm '5 3 4  ' No chest tightness -> Chest feels very tight '4 3 3  ' No dyspnea for 1 flight stairs/hill -> Very dyspneic for 1 flight of stairs '5 3 5  ' No limitations for ADL at home -> Very limited with ADL at home '5 4 3  ' Confident leaving home -> Not at all confident leaving home 0 1 2  Sleep soundly -> Do not sleep soundly because of lung condition '3 4 3  ' Lots of Energy -> No energy at all '5 4 4  ' TOTAL Score (max 40)  '30 25 27   ' No flowsheet data found.    12/19/2020 -   Chief Complaint  Patient presents with   Follow-up    Had Covid PNA 10/2020, doing some better     HPI Laurie Horton 77 y.o. -presents for follow-up of her COPD.  She continues to use Atrovent nebulizer.  She prefers nebulizers of inhaler.  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  She uses oxygen at night with exertion.  She tells me that in December  2021 around Christmas she had respiratory viral symptoms.  She says rapid antigen test by her daughter was negative.  Then in January 2022 she followed up with primary care physician who did a chest x-ray that showed pneumonia.  I have the chest x-ray with me  visualized it.  There is an infiltrate.  She says she was told that she had Covid but there is no confirmatory evidence for this.  She treated herself at home.  She cannot have vaccines because of multiple drug allergies according to history.  I supported her in the decision making.  She is worried about abnormal chest x-ray.        CAT Score 12/19/2020  Total CAT Score 18       OV 01/30/2021  Subjective:  Patient ID: Laurie Horton, female , DOB: 09/19/1946 , age 28 y.o. , MRN: [id number] , ADDRESS: [address] [place] [address] PCP [name] Agreste, MD Patient Care Team: [name] Agreste, MD as PCP - General (Family Medicine) Lorretta Harp, MD as PCP - Cardiology (Cardiology) Margaretha Sheffield, MD as Referring Physician (Physical Medicine and Rehabilitation) Laurence Spates, MD (Inactive) as Consulting Physician (Gastroenterology) Garvin Fila, MD as Consulting Physician (Neurology) Martin[place], Peter M, MD as Consulting Physician (Cardiology) Brand Males, MD as Consulting Physician (Pulmonary Disease) Adele Schilder Arlyce Harman, MD as Consulting Physician (Psychiatry) Lorretta Harp, MD as Consulting Physician (Peripheral Vascular Disease)  This Provider for this visit: Treatment Team:  Attending Provider: Brand Males, MD    01/30/2021 -   Chief Complaint  Patient presents with   Follow-up    Doing ok, breathing is the same     HPI Laurie Horton 77 y.o. -presents for follow-up for COPD.  Here to review the results.  Her daughter Andriea Hasegawa is on the phone.  No new interim complaints.  A Covid IgG is negative thus making her recent viral infection is unlikely as Covid.  I offered a referral for Covid monoclonal antibody prophylaxis but she declined.  Her pulmonary function test shows 12% decline in FEV1 in 5 years.  I reviewed the chart and do not find an alpha-1 check.  I presume this was done prior to [place] using electronic medical records.  She  is open to getting it tested again.  She is to go since 2 COPD.  She tells me that the DME company is out of supply with tubing for her nebulizer.  She has used an inhaler but she is somewhat skeptical because of nasal septal perforation.  Explained to her inhalers oral.  She then talked about dentures.  Explained that we can do inhalers through an [facility].  She had high-resolution CT chest.  This shows emphysema.  There is evidence of cor pulmonale.  There is stable thoracic aorta aneurysm.  No fibrosis no cancer.   CAT Score 01/30/2021 12/19/2020  Total CAT Score 10 18        Ref Range & Units 1 mo ago  SARS COV1 AB(IGG)SPIKE,SEMI QN <1.00 index <1.00         IMPRESSION: 1. No evidence of interstitial lung disease. Air trapping is indicative of small airways disease. 2. Ascending aortic aneurysm, stable. Recommend annual imaging followup by CTA or MRA. This recommendation follows 2010 ACCF/AHA/AATS/ACR/ASA/SCA/SCAI/SIR/STS/SVM Guidelines for the Diagnosis and Management of Patients with Thoracic Aortic Disease. Circulation. 2010; 121: H909-P112. Aortic aneurysm NOS (ICD10-I71.9). 3. Aortic atherosclerosis (ICD10-I70.0). Coronary artery calcification. 4. Enlarged pulmonic trunk, indicative of pulmonary  arterial hypertension. 5.  Emphysema (ICD10-J43.9).     Electronically Signed   By: Lorin Picket M.D.   On: 01/15/2021 11:52     OV 07/22/2021  Subjective:  Patient ID: Laurie Horton, female , DOB: 05/06/1946 , age 76 y.o. , MRN: [id number] , ADDRESS: [address] [place] [address] PCP [name] Agreste, MD Patient Care Team: [name] Agreste, MD as PCP - General (Family Medicine) [place], Peter M, MD as PCP - Cardiology (Cardiology) Margaretha Sheffield, MD as Referring Physician (Physical Medicine and Rehabilitation) Laurence Spates, MD (Inactive) as Consulting Physician (Gastroenterology) Garvin Fila, MD as Consulting Physician  (Neurology) [place], Peter M, MD as Consulting Physician (Cardiology) Brand Males, MD as Consulting Physician (Pulmonary Disease) Adele Schilder Arlyce Harman, MD as Consulting Physician (Psychiatry)  This Provider for this visit: Treatment Team:  Attending Provider: Brand Males, MD    07/22/2021 -   Chief Complaint  Patient presents with   Follow-up    Pt states she had pneumonia since last visit. States she went to see PCP after finishing meds and was told she still had infection and was placed back on abx by PCP.   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  She uses oxygen and nebulizer  HPI Laurie Horton 77 y.o. -returns for follow-up.  On 06/18/2021 she called with COPD exacerbation symptoms.  Gave her Biaxin and prednisone.  But she tells me that because the line was busy our office did not call in the medications till 5 days later when they got hold of her.  She says she is only partially improved and she is her primary care physician.  She had chest x-ray 07/16/2021.  This reports of interstitial prominence.  However there is no mass or consolidation.  She had basic labs that I reviewed and it is normal.  Apparently primary care physician gave her another Biaxin.  But there is no prednisone.  She still has yellow symptoms.  Symptoms only partially resolved.  She feels another round of prednisone will help her.  Of note we put her on triple inhaler BREZTRI last visit but this gave her thrush.  She is taking ipratropium.  If she only takes it 3 times a day when she has tremors that she is managing just with 2 times a day and this is insufficient.  She is using oxygen continuous now up to 2-1/2 L.  She is now moved downstairs because of her shortness of breath.  She is wanting oxygen concentrator upstairs and downstairs.  In the last few to several years she is refused flu shot.   She will not have the COVID-vaccine either.      OV 08/21/2021  Subjective:  Patient ID: Laurie Horton, female , DOB: 13-Jan-1946 , age 65 y.o. , MRN: [id number] , ADDRESS: [address], [place] [address] PCP [name] Agreste, MD Patient Care Team: [name] Agreste, MD as PCP - General (Family Medicine) [place], Peter M, MD as PCP - Cardiology (Cardiology) Margaretha Sheffield, MD as Referring Physician (Physical Medicine and Rehabilitation) Laurence Spates, MD (Inactive) as Consulting Physician (Gastroenterology) Garvin Fila, MD as Consulting Physician (Neurology) [place], Peter M, MD as Consulting Physician (Cardiology) Brand Males, MD as Consulting Physician (Pulmonary Disease) Adele Schilder Arlyce Harman, MD as Consulting Physician (Psychiatry)  This Provider for this visit: Treatment Team:  Attending Provider: Brand Males, MD  Follow-up emphysema  with chronic hypoxemic restorative failure and associated fibromyalgia and associated chronic systolic heart failure: Overall doing well.  She uses oxygen and nebulizer  08/21/2021 -   Chief Complaint  Patient presents with   Follow-up    Pt was at the ED 10/17 after PCP sent her there. States that she has been using Yupelri neb sol. States that it does make her feel dizzy but has helped with her breathing better than prior neb sol.     HPI Laurie Horton 77 y.o. -returns for follow-up to have a follow-up chest x-ray because of his concerns of interstitial edema versus atypical pneumonia at last visit.  I look to the follow-up chest x-ray.  Personal visualization shows it is unchanged.  I think this is a baseline based on the fact his CT scan of the chest in March 2022 look very similar.  She is feeling stable.  Also last visit we put her on Yupelri nebulizer.  Part of this follow-up was to see how she is doing with that.  She says it makes her dizzy but then after a while she is able to tolerate it.  So she is careful  and some days she does not use it.  But overall she feels its more beneficial than ipratropium.  Therefore she wants to stick with it.  Of note on 08/18/2021 she did have a video visit with her psychiatrist for depression.  She also has had a skin biopsy in her left thigh and apparently this was infected and she is sent to the ED for IV antibiotics but there was 11-hour wait and she left AMA without being seen.  She is very upset about her experience.  She is reflecting on it and is wondering whether she should go to the medical board and file a formal complaint.  But overall from a respiratory standpoint she is stable.    The other issues that she wants to oxygen concentrator's.  1 5 stairs and 1 for downstairs.  Last visit I thought I put an order in but she says at that never got it.  She feels service at [facility] is terrible.  I informed her that we will do the order again.          OV 11/13/2021  Subjective:  Patient ID: Laurie Horton, female , DOB: 1946-08-31 , age 3 y.o. , MRN: [id number] , ADDRESS: [address] [place] [address] PCP Merrilee Seashore, MD Patient Care Team: Merrilee Seashore, MD as PCP - General (Internal Medicine) [place], Peter M, MD as PCP - Cardiology (Cardiology) Margaretha Sheffield, MD as Referring Physician (Physical Medicine and Rehabilitation) Laurence Spates, MD (Inactive) as Consulting Physician (Gastroenterology) Garvin Fila, MD as Consulting Physician (Neurology) [place], Peter M, MD as Consulting Physician (Cardiology) Brand Males, MD as Consulting Physician (Pulmonary Disease) Adele Schilder Arlyce Harman, MD as Consulting Physician (Psychiatry)  This Provider for this visit: Treatment Team:  Attending Provider: Brand Males, MD    11/13/2021 -   Chief Complaint  Patient presents with   Follow-up    Pt states her breathing has become a little worse since last visit. States she has had a virus for the past 10 days.    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  She uses oxygen and nebulizer  HPI Laurie Horton 77 y.o. -returns for follow-up.  Is a 92-month routine follow-up.  She tells me  that some of her neighbors got sick with the flu.  She had flulike symptoms which she is flu negative.  She was in bed for a week.  She felt it was mild.  She did not call us.  She is back to baseline now.  Overall she is stable.  She tells me that her biggest issue is the portable oxygen.  She says Medicare refused to give her 2 systems 1 5 stairs and 1 for downstairs.  Currently she has a Haematologist inside the house that she plugs and uses it at night.  For exertion she uses a Helio system.  She feels that he does system using 100 pound.  Oxygen gallon tank that is outside on the patio.  She wants to bring this inside and then the DME company will run her house for upstairs and downstairs.  She says when she goes into the system the DME company says she cannot revert back to her concentrator.  She is a little bit nervous about this approach with the biggest hold-up is that the DME company is asking for credit card information and she is worried about identity theft.  And price gouging   OV 05/22/2022  Subjective:  Patient ID: Laurie Horton, female , DOB: Oct 06, 1946 , age 89 y.o. , MRN: [id number] , ADDRESS: [place] [id number] PCP Merrilee Seashore, MD Patient Care Team: Merrilee Seashore, MD as PCP - General (Internal Medicine) Martinique, Peter M, MD as PCP - Cardiology (Cardiology) Margaretha Sheffield, MD as Referring Physician (Physical Medicine and Rehabilitation) Laurence Spates, MD (Inactive) as Consulting Physician (Gastroenterology) Garvin Fila, MD as Consulting Physician (Neurology) [place], Peter M, MD as Consulting Physician (Cardiology) Brand Males, MD as Consulting Physician (Pulmonary Disease) Adele Schilder Arlyce Harman, MD as Consulting Physician (Psychiatry)  This Provider for this visit: Treatment Team:  Attending Provider: Brand Males, MD  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  She uses oxygen and nebulizer . Last CT chest MArch 2022  05/22/2022 -   Chief Complaint  Patient presents with   Follow-up    Follow-up SOB, Coughing, wheezing     HPI Laurie Horton 77 y.o. -returns for follow-up.  This is a routine follow-up but she tells me that since early June 2023 she has been sick.  Apparently she finished grocery shopping and then came home had chills diarrhea and cough shortly after that with a severe cough she had hemoptysis that started and resolved a week later but she was in bed for 21 days.  She was increased use of nebulizer.  She believes it was a respiratory virus.  Apparently repeated COVID test was negative.  She was so ill that her daughters who live an hour and a half however dropping off food for her.  She lives alone.  Currently she is better but still having wheezing.  She is still using nebulizer twice daily.  She is also got postnasal drip and she is needing Benadryl but the hemoptysis resolved.  She is willing to take prednisone.  She continues oxygen and her regular nebulizer and Singulair.  Last CT scan of the chest was over a year ago.  She is willing to get this repeated.   05/22/2022: OV with Dr. [name]. Intended to be routine follow up but she had been having sick symptoms since June 2023. She had gone grocery shopping and came home  with chills, diarrhea, and cough. She also developed hemoptysis, which resolved after a week. Stated she was in bed for 21 days. Doing better at OV but still wheezing. She is also having postnasal drip. 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  Continue supplemental O2. Continue nebulized medications. Continued singulair. Plan for CT chest in 2-4 weeks.   06/09/2022: OV with Cobb NP for follow-up after being treated for slow to resolve AECOPD related to respiratory virus.  She completed prednisone course and had scheduled CT in the interim.  CT scan showed a right middle lobe airspace disease, concerning for pneumonia.  She has not had any antibiotics up to this point.  Today, she reports still having some increased shortness of breath from her baseline.  She did feel better when she was on the prednisone.  She also has a productive cough with yellow to green sputum and chest congestion.  She has not had any further episodes of hemoptysis.  She does have an occasional wheeze, which improves with her nebulizer treatments.  She denies any fevers, night sweats, orthopnea, leg swelling, URI symptoms, interim sick exposures.  She is using her DuoNeb 3 times a day.  She states that she is unable to tolerate any and all inhalers as they cause irritation to the back of her throat and mouth.  She takes Singulair at bedtime.  She continues on her supplemental oxygen with activity and at night.  Usually uses 2-3 lpm.  Viral respiratory infection in June with persistent AECOPD. Treated with prednisone taper in July. CT chest 7/28 revealed a new RML airspace consolidation, concerning for pna given her infectious symptoms. We will treat her with augmentin 7 day course. Advised to use mucinex to assist with mucociliary clearance. Repeat CT chest 6 weeks after completion of abx.  06/23/2022: Today - follow up Patient presents today for follow up after being treated for AECOPD and pneumonia. She reports that she has been feeling much better since she completed her abx and prednisone. Breathing is back to baseline and her cough is clearing up, usually with very pale yellow or clear sputum. She does still feel more fatigued than usually but feels like she's slowly  building her stamina back up. Denies any fevers, chills, night sweats, hemoptysis, chest congestion, orthopnea, leg swelling. She continues to use her ipratropium neb for maintenance 3 times a day. Takes singulair at bedtime. Maintaining oxygen on 2-3 lpm.    OV 08/27/2022  Subjective:  Patient ID: Laurie Horton, female , DOB: 25-Aug-1946 , age 64 y.o. , MRN: [id number] , ADDRESS: [place] [id number] PCP Merrilee Seashore, MD Patient Care Team: Merrilee Seashore, MD as PCP - General (Internal Medicine) [place], Peter M, MD as PCP - Cardiology (Cardiology) Margaretha Sheffield, MD as Referring Physician (Physical Medicine and Rehabilitation) Laurence Spates, MD (Inactive) as Consulting Physician (Gastroenterology) Garvin Fila, MD as Consulting Physician (Neurology) [place], Peter M, MD as Consulting Physician (Cardiology) Brand Males, MD as Consulting Physician (Pulmonary Disease) Adele Schilder Arlyce Harman, MD as Consulting Physician (Psychiatry)  This Provider for this visit: Treatment Team:  Attending Provider: Brand Males, MD  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  She uses oxygen and nebulizer . Last CT chest MArch 2022  08/27/2022 -   Chief Complaint  Patient presents with   Follow-up    Follow-up visit PT here to discuss results of CT scan  Wants walk test to qualify for inogen      HPI Laurie Horton 77 y.o. -returns for follow-up.  She states that she is now been approved by her [place] healthcare for [employer] oxygen system because the regular oxygen system is heavy.  She needs a qualifying walk otherwise shortness of breath is at baseline.  She had CT scan of the chest without contrast.  The right middle lobe pneumonia is resolved.  No pulmonary fibrosis no lung cancer no lung nodule no pneumonia anymore.  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  She continues her ipratropium and Singulair.  She is refused all vaccines because of multiple drug allergies.  Otherwise no new issues  Social: Her granddaughter age 108 has new diagnosis of breast cancer and she is upset about it.   CT Chest data 08/18/22  Narrative & Impression  CLINICAL DATA:  Pneumonia, complication suspected, xray done   Community acquired pneumonia of right middle lobe.   EXAM: CT CHEST WITHOUT CONTRAST   TECHNIQUE: Multidetector CT imaging of the chest was performed following the standard protocol without IV contrast.   RADIATION DOSE REDUCTION: This exam was performed according to the departmental dose-optimization program which includes automated exposure control, adjustment of the mA and/or kV according to patient size and/or use of iterative reconstruction technique.   COMPARISON:  CT 05/29/2022   FINDINGS: Cardiovascular: Fusiform aneurysmal dilatation of the ascending aorta maximal dimension 4.1 cm, previously 4 cm. The thoracic aorta is densely calcified and tortuous. The origin of the left subclavian artery is densely calcified. Dilated main pulmonary artery at 3.5 cm. Upper normal heart size with dense coronary artery calcifications. No pericardial effusion.   Mediastinum/Nodes: Scattered small nonenlarged mediastinal lymph nodes, stable from prior exam and likely reactive. No suspicious mediastinal adenopathy. Assessment for hilar adenopathy is limited on this unenhanced exam. Unremarkable appearance of the esophagus.   Lungs/Pleura: The previous right middle lobe airspace disease has resolved. No evidence of underlying mass. Moderate to advanced emphysema. Minor dependent atelectasis in the right lower lobe. No suspicious pulmonary nodule or mass. There is retained mucus within the right mainstem and lower lobe bronchus. No pleural fluid.   Upper Abdomen: No acute findings.   Musculoskeletal: Mild scoliosis with  diffuse degenerative change in the thoracic spine. There are no acute or suspicious osseous abnormalities. No chest wall soft tissue abnormalities.   IMPRESSION: 1. Previous right middle lobe airspace disease has resolved. No residual or evidence of underlying mass. 2. 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 Recommend annual imaging followup by CTA or MRA. This recommendation follows 2010 ACCF/AHA/AATS/ACR/ASA/SCA/SCAI/SIR/STS/SVM Guidelines for the Diagnosis and Management of Patients with Thoracic Aortic Disease. Circulation. 2010; [id number]. Aortic aneurysm NOS (ICD10-I71.9) 3. Aortic atherosclerosis and coronary artery calcifications. 4. Dilated main pulmonary artery suggesting pulmonary arterial hypertension. 5. Moderate to advanced emphysema   Aortic Atherosclerosis (ICD10-I70.0) and Emphysema (ICD10-J43.9).     Electronically Signed   By: Keith Rake M.D.   On: 08/19/2022 20:37      OV 01/15/2023  Subjective:  Patient ID: Laurie Horton, female , DOB: 12-19-1945 , age 43 y.o. , MRN: [id number] , ADDRESS: [place] [id number] PCP Merrilee Seashore, MD Patient Care Team: Merrilee Seashore, MD as PCP - General (Internal Medicine) [place], Peter M, MD as PCP - Cardiology (Cardiology) Margaretha Sheffield, MD as Referring Physician (Physical Medicine and Rehabilitation) Laurence Spates, MD (Inactive) as  Consulting Physician (Gastroenterology) Garvin Fila, MD as Consulting Physician (Neurology) [place], Peter M, MD as Consulting Physician (Cardiology) Brand Males, MD as Consulting Physician (Pulmonary Disease) Adele Schilder Arlyce Harman, MD as Consulting Physician (Psychiatry)  This Provider for this visit: Treatment Team:  Attending Provider: Brand Males, MD    01/15/2023 -   Chief Complaint  Patient presents with   Follow-up    Patient saw PCP.  Treated for yeast  infection.  Rx Flagyl and  Diflucan.  INR level at 7.6.  PCP d/c'd meds.  Diet a lot of greens.  Patient improving.  C/o wheeze and cough with yellow phlem.  Some weakness.   Fu emphysema  HPI Laurie Horton 77 y.o. -  presents for routine followup. Last few weeks was having yellow sputum and cough with this. DOes  not want antibioptics or prenisone due to side effect and yeast infection ris, Dyspnea is stable though at 1-2L [place] at rest. Does chair exrcises but reduced to every other day to give recovery time. REcently with flagyl and diflucan INR went up. She cannot afford eliquis she says.So tried to enroll in a clinical tril with Dr Leonie Man but opted against it0due to AE risk concern     Simple office walk 185 feet x  3 laps goal with forehead probe 08/27/2022    O2 used ra   Number laps completed 1 of 3 laps   Comments about pace x   Resting Pulse Ox/HR 98% and 83/min   Final Pulse Ox/HR 99% and 98/min   Desaturated </= 88% yes   Desaturated <= 3% points yes   Got Tachycardic >/= 90/min yes   Symptoms at end of test Mod-severe dyspnea   Miscellaneous comments Needing 2L [place] to correct and do 1 lap     PFT     Latest Ref Rng & Units 01/30/2021    8:51 AM 01/20/2016    9:35 AM 08/14/2013   10:17 AM  PFT Results  FVC-Pre L 2.61  2.79    FVC-Predicted Pre % 92  93  100   FVC-Post L   3.07   FVC-Predicted Post %   103   Pre FEV1/FVC % % 63  67  62   Post FEV1/FCV % %   64   FEV1-Pre L 1.63  1.86  1.84   FEV1-Predicted Pre % 77  82  82   FEV1-Post L   1.97   DLCO uncorrected ml/min/mmHg 9.45  9.75  11.57   DLCO UNC% % 49  40  50   DLCO corrected ml/min/mmHg 9.45     DLCO COR %Predicted % 49     DLVA Predicted % 57  51  58   TLC L 4.59  4.62  4.32   TLC % Predicted % 90  91  88   RV % Predicted % 80  78  77        has a past medical history of Acute diastolic heart failure, NYHA class 1 ([place]) (02/08/2017), Acute heart failure ([place]) (02/08/2017), Cataract, CHF (congestive  heart failure) ([place]) (99991111), Complication of anesthesia, COPD (chronic obstructive pulmonary disease) ([place]), Depression, Depression, Diverticulitis, DVT (deep venous thrombosis) (H[facility]), Fibromyalgia, GERD (gastroesophageal reflux disease), Heart murmur, Hiatal hernia, History of deviated nasal septum, HTN (hypertension), Hyperlipidemia, Hypertension, Hypertensive retinopathy, Myocardial infarction ([place]), Obesity, On supplemental oxygen therapy, Osteoporosis, Osteoporosis, Oxygen deficiency, PAT (paroxysmal atrial tachycardia), PFO (patent foramen ovale), PULMONARY NODULE, LEFT LOWER LOBE (10/14/2009), PVD (peripheral vascular disease) with claudication (  [place]) (12/2017), Right middle lobe pneumonia (07/24/2011), Stroke [facility]), and TOBACCO ABUSE (06/04/2009).   reports that she quit smoking about 12 years ago. Her smoking use included cigarettes. She has a 90.00 pack-year smoking history. She has never used smokeless tobacco.  Past Surgical History:  Procedure Laterality Date   ABDOMINAL HYSTERECTOMY N/A    Phreesia 11/04/2020   CARDIOVASCULAR STRESS TEST  12/26/2004   EF 74%. NO EVIDENCE OF ISCHEMIA   CATARACT EXTRACTION Left    Dr. Elliot Dally   ESOPHAGOGASTRODUODENOSCOPY (EGD) WITH PROPOFOL N/A 04/15/2015   Procedure: ESOPHAGOGASTRODUODENOSCOPY (EGD) WITH PROPOFOL;  Surgeon: Laurence Spates, MD;  Location: [facility];  Service: Endoscopy;  Laterality: N/A;   EYE SURGERY Left    Cat Sx   JOINT REPLACEMENT N/A    Phreesia 11/04/2020   KNEE ARTHROSCOPY  2000   left   LAPAROSCOPIC CHOLECYSTECTOMY  04-16-2010   [facility]   LOWER EXTREMITY ANGIOGRAPHY N/A 09/09/2017   Procedure: Lower Extremity Angiography;  Surgeon: Lorretta Harp, MD;  Location: [facility];  Service: Cardiovascular;  Laterality: N/A;   LOWER EXTREMITY INTERVENTION Left 01/17/2018   Procedure: LOWER EXTREMITY INTERVENTION;  Surgeon: Lorretta Harp, MD;  Location: [facility];  Service: Cardiovascular;  Laterality:  Left;   MOUTH SURGERY     03-26-15 multiple extractions stitches remains   PERIPHERAL VASCULAR INTERVENTION Left 01/17/2018   Procedure: PERIPHERAL VASCULAR INTERVENTION;  Surgeon: Lorretta Harp, MD;  Location: [facility];  Service: Cardiovascular;  Laterality: Left;  COMMON ILIAC   RIGHT/LEFT HEART CATH AND CORONARY ANGIOGRAPHY N/A 03/04/2017   Procedure: Right/Left Heart Cath and Coronary Angiography;  Surgeon: Peter M Martinique, MD;  Location: [facility];  Service: Cardiovascular;  Laterality: N/A;   TOTAL ABDOMINAL HYSTERECTOMY     post op needed oxygen was told "she gave them a scare"   TUBAL LIGATION     US ECHOCARDIOGRAPHY  11/20/2009   EF 55-60%    Allergies  Allergen Reactions   Alprazolam Anaphylaxis and Other (See Comments)    REACTION: stops breathing   Bee Venom Anaphylaxis   Iodine Anaphylaxis, Swelling and Other (See Comments)    REACTION: swelling in throat   Pseudoephedrine Hcl Er Shortness Of Breath   Budesonide-Formoterol Fumarate Other (See Comments)    Blisters inside of mouth all over   Crestor [Rosuvastatin Calcium] Other (See Comments)    Unable to walk   Esomeprazole Magnesium Other (See Comments)    REACTION: "bouncing off walls"   Flonase [Fluticasone Propionate] Other (See Comments)    NOSE BLEED   Lamictal [Lamotrigine] Rash    Patient got rash, labored breathing, and diarrhea   Loratadine Other (See Comments)    claritin D causes shaking   Lotrimin [Clotrimazole] Other (See Comments)    Mouth blisters   Lunesta [Eszopiclone] Other (See Comments)    REACTION: "slept for a week"   Oxcarbazepine Other (See Comments)    Causes deep sleep and dizziness   Statins Other (See Comments)    Can't walk, legs won't work    Zolpidem Tartrate Other (See Comments)    REACTION: "slept for a week"   Betadine [Povidone Iodine] Other (See Comments)    Breathing problems   Bevespi Aerosphere [Glycopyrrolate-Formoterol] Other (See Comments)    Pt  believes this caused mouth sores and thrush    Breztri Aerosphere [Budeson-Glycopyrrol-Formoterol] Other (See Comments)    Thrush   Clarithromycin Other (See Comments)    All "mycins",  Puts into "a" fib, Will take if has to for severe sinus infection   Effexor [Venlafaxine] Nausea And Vomiting and Other (See Comments)    cramps   Lexapro [Escitalopram Oxalate] Other (See Comments)    hallucinations   Aciphex [Rabeprazole Sodium] Rash   Alendronate Sodium Other (See Comments)    "caused stomach problems for 3 days"   Avelox [Moxifloxacin Hcl In Nacl] Other (See Comments)    Stomach cramps.    Bextra [Valdecoxib] Rash   Ceclor [Cefaclor] Rash   Cephalexin Rash and Other (See Comments)    Pt states that she is possibly allergic to this - had a reaction to Cefaclor in the past and she does not want to these class drugs. Added per patient request.   Covera-Hs [Verapamil Hcl] Palpitations   Dicyclomine Hcl Rash   Other Other (See Comments)    Glue from ekg/heart monitor leads --rash, Any MYCINS   Tessalon Perles Rash    Immunization History  Administered Date(s) Administered   Influenza Split 08/03/2011, 08/01/2012   Influenza,inj,Quad PF,6+ Mos 08/14/2013, 09/03/2014, 09/04/2015, 08/18/2016   Influenza-Unspecified 11/03/1999   Pneumococcal Conjugate-13 10/01/2014   Pneumococcal Polysaccharide-23 09/03/1999, 06/02/2006, 08/14/2013   Td 11/02/1994   Tdap 08/18/2016    Family History  Problem Relation Age of Onset   Dementia Mother    Diabetes Mother    Alzheimer's disease Mother    Heart attack Brother 71   Heart attack Father    Schizophrenia Sister    Diabetes Sister    Tremor Sister      Current Outpatient Medications:    Alirocumab (PRALUENT) 75 MG/ML SOAJ, INJECT 75 MG INTO THE SKIN EVERY 14 (FOURTEEN) DAYS., Disp: 2 mL, Rfl: 11   carvedilol (COREG) 12.5 MG tablet, Take 1 tablet (12.5 mg total) by mouth 2 (two) times daily., Disp: 180 tablet, Rfl: 3   clopidogrel  (PLAVIX) 75 MG tablet, TAKE 1 TABLET BY MOUTH EVERY DAY, Disp: 30 tablet, Rfl: 11   desvenlafaxine (PRISTIQ) 25 MG 24 hr tablet, Take 1 tablet (25 mg total) by mouth daily., Disp: 30 tablet, Rfl: 2   diclofenac sodium (VOLTAREN) 1 % GEL, Apply 2 g topically 4 (four) times daily., Disp: , Rfl: 5   estradiol (ESTRACE) 0.1 MG/GM vaginal cream, Place 1 Applicatorful vaginally every 14 (fourteen) days., Disp: , Rfl:    Eyelid Cleansers (OCUSOFT BABY EYELID & EYELASH EX), Apply topically., Disp: , Rfl:    furosemide (LASIX) 40 MG tablet, TAKE 1 TABLET BY MOUTH EVERY DAY, Disp: 30 tablet, Rfl: 11   gabapentin (NEURONTIN) 100 MG capsule, Take 200 mg by mouth See admin instructions. Take 1 in am and 1 in the lunch and 2 at bedtime, Disp: , Rfl:    HYDROcodone-acetaminophen (NORCO/VICODIN) 5-325 MG tablet, Take 1 tablet by mouth in the morning and at bedtime., Disp: , Rfl:    ipratropium (ATROVENT) 0.02 % nebulizer solution, Take 1.25 mLs (0.25 mg total) by nebulization every 4 (four) hours as needed for wheezing or shortness of breath. Rolan Lipa, Disp: 62.5 mL, Rfl: 5   lidocaine (LIDODERM) 5 %, Place 1-3 patches onto the skin as directed., Disp: , Rfl:    montelukast (SINGULAIR) 10 MG tablet, TAKE 1 TABLET BY MOUTH EVERYDAY AT BEDTIME (Patient taking differently: Take 10 mg by mouth daily.), Disp: 90 tablet, Rfl: 2   nitroGLYCERIN (NITROSTAT) 0.4 MG SL tablet, PLACE 1 TABLET UNDER THE TONGUE EVERY 5 MINUTES AS NEEDED FOR CHEST PAIN., Disp: 25 tablet, Rfl: 3  ondansetron (ZOFRAN-ODT) 8 MG disintegrating tablet, Take 1 tablet (8 mg total) by mouth every 8 (eight) hours as needed for nausea or vomiting., Disp: 20 tablet, Rfl: 0   OXYGEN, Inhale 1.5-3 L into the lungs as needed (for shortness of breath)., Disp: , Rfl:    pantoprazole (PROTONIX) 40 MG tablet, TAKE 1 TABLET BY MOUTH EVERY DAY, Disp: 30 tablet, Rfl: 5   polyethylene glycol (MIRALAX / GLYCOLAX) packet, Take 17 g by mouth daily as needed for  mild constipation. , Disp: , Rfl:    spironolactone (ALDACTONE) 25 MG tablet, TAKE 1 TABLET BY MOUTH EVERY DAY, Disp: 30 tablet, Rfl: 11   traZODone (DESYREL) 100 MG tablet, Take one tab daily as needed for sleep, Disp: 30 tablet, Rfl: 2   warfarin (COUMADIN) 5 MG tablet, TAKE 1/2 TO 1 TABLET BY MOUTH DAILY OR AS DIRECTED BY CLINIC, Disp: 45 tablet, Rfl: 1      Objective:   Vitals:   01/15/23 1046  BP: 120/74  Pulse: 81  Temp: 98.2 F (36.8 C)  TempSrc: Oral  SpO2: (!) 89%  Weight: 180 lb (81.6 kg)  Height: 5\' 4"  (1.626 m)    Estimated body mass index is 30.9 kg/m as calculated from the following:   Height as of this encounter: 5\' 4"  (1.626 m).   Weight as of this encounter: 180 lb (81.6 kg).  @WEIGHTCHANGE @  [employer]   01/15/23 1046  Weight: 180 lb (81.6 kg)     Physical Exam    General: No distress. Looks well Neuro: Alert and Oriented x 3. GCS 15. Speech normal Psych: Pleasant Resp:  Barrel Chest - yes.  Wheeze - no, Crackles - no, No overt respiratory distress CVS: Normal heart sounds. Murmurs - no Ext: Stigmata of Connective Tissue Disease - no HEENT: Normal upper airway. PEERL +. No post nasal drip        Assessment:       ICD-10-CM   1. Chronic respiratory failure with hypoxia (HCC)  J96.11     2. COPD, severe ([place])  J44.9     3. Multiple drug allergies  Z88.9     4. Screening for lung cancer  Z12.2          Plan:     Patient Instructions     ICD-10-CM   1. Chronic respiratory failure with hypoxia (HCC)  J96.11     2. COPD, severe ([place])  J44.9     3. Multiple drug allergies  Z88.9     4. Screening for lung cancer  Z12.2        Chronic respiratory failure with hypoxia (HCC) Emphysema  -Stable disease though some bronchitis could be brewing at this visit 01/15/2023   Plan - Continue ipratropium 3 mL every 6 hours as needed for shortness of breath or wheezing. Use twice daily for maintenance  - Continue singulair 10 mg At  bedtime -  - Continue supplemental oxygen 2-3 lpm with activity and at night. Goal oxygen >88-90%  - Continue Mucinex 600 mg Twice daily for chest congestion/cough - Holding off on antibiotics and prednisone due to side effect concern  Vaccine counseling Multiple drug allergies  -Respect hesitancy towards COVID, RSV and flu vaccine  Plan - Monitor clinically   Screening for lung cancer  -No evidence of pneumonia on CT scan of the chest October 2023.  No lung nodules no lung cancer.  No pulmonary fibrosis   plan -Repeat CT chest mid October  2024 -low-dose  CT scan of the chest -Smoking to remain in remission     Follow up in 4 months with Dr. [name] or sooner if needed     SIGNATURE    Dr. Brand Males, M.D., F.C.C.P,  Pulmonary and Critical Care Medicine Staff Physician, [place] Director - Interstitial Lung Disease  Program  Pulmonary [place] [facility] [place], [place], [address]  Pager: [phone], If no answer or between  15:00h - 7:00h: call [phone] Telephone: [phone]  11:21 AM 01/15/2023

## 2023-01-15 NOTE — Patient Instructions (Addendum)
ICD-10-CM   1. Chronic respiratory failure with hypoxia (HCC)  J96.11     2. COPD, severe ([place])  J44.9     3. Multiple drug allergies  Z88.9     4. Screening for lung cancer  Z12.2        Chronic respiratory failure with hypoxia (HCC) Emphysema  -Stable disease though some bronchitis could be brewing at this visit 01/15/2023   Plan - Continue ipratropium 3 mL every 6 hours as needed for shortness of breath or wheezing. Use twice daily for maintenance  - Continue singulair 10 mg At bedtime -  - Continue supplemental oxygen 2-3 lpm with activity and at night. Goal oxygen >88-90%  - Continue Mucinex 600 mg Twice daily for chest congestion/cough - Holding off on antibiotics and prednisone due to side effect concern  Vaccine counseling Multiple drug allergies  -Respect hesitancy towards COVID, RSV and flu vaccine  Plan - Monitor clinically   Screening for lung cancer  -No evidence of pneumonia on CT scan of the chest October 2023.  No lung nodules no lung cancer.  No pulmonary fibrosis   plan -Repeat CT chest mid October  2024 -low-dose CT scan of the chest -Smoking to remain in remission     Follow up in 4 months with Dr. [name] or sooner if needed

## 2023-01-19 ENCOUNTER — Encounter: Payer: Self-pay | Admitting: Neurology

## 2023-01-25 ENCOUNTER — Ambulatory Visit: Payer: Medicare Other | Attending: Cardiovascular Disease

## 2023-01-25 DIAGNOSIS — Z7901 Long term (current) use of anticoagulants: Secondary | ICD-10-CM | POA: Diagnosis not present

## 2023-01-25 DIAGNOSIS — I480 Paroxysmal atrial fibrillation: Secondary | ICD-10-CM | POA: Diagnosis not present

## 2023-01-25 LAB — POCT INR: INR: 2 (ref 2.0–3.0)

## 2023-01-25 NOTE — Patient Instructions (Signed)
Description   Take 1 tablet today and then resume taking 1/2 tablet daily except 1 tablet on Wednesday and Fridays.  Recheck INR in 2 weeks (Typically 4 weeks) [facility] [phone].

## 2023-02-05 ENCOUNTER — Other Ambulatory Visit: Payer: Self-pay | Admitting: Physical Medicine and Rehabilitation

## 2023-02-05 DIAGNOSIS — M48061 Spinal stenosis, lumbar region without neurogenic claudication: Secondary | ICD-10-CM

## 2023-02-16 ENCOUNTER — Ambulatory Visit: Payer: Medicare Other | Attending: Cardiology | Admitting: *Deleted

## 2023-02-16 DIAGNOSIS — Z7901 Long term (current) use of anticoagulants: Secondary | ICD-10-CM

## 2023-02-16 DIAGNOSIS — I480 Paroxysmal atrial fibrillation: Secondary | ICD-10-CM

## 2023-02-16 LAB — POCT INR: INR: 1.7 — AB (ref 2.0–3.0)

## 2023-02-16 NOTE — Patient Instructions (Addendum)
Description   Take 1 tablet of warfarin today and then resume taking 1/2 tablet daily except 1 tablet on Wednesday and Fridays. Recheck INR in 3 weeks (Typically 4 weeks) [facility] [phone].

## 2023-03-03 ENCOUNTER — Other Ambulatory Visit: Payer: Self-pay | Admitting: Cardiology

## 2023-03-10 ENCOUNTER — Other Ambulatory Visit: Payer: Self-pay | Admitting: Cardiology

## 2023-03-10 ENCOUNTER — Telehealth: Payer: Self-pay

## 2023-03-10 ENCOUNTER — Encounter: Payer: Self-pay | Admitting: Cardiology

## 2023-03-10 ENCOUNTER — Telehealth: Payer: Self-pay | Admitting: Cardiology

## 2023-03-10 ENCOUNTER — Ambulatory Visit: Payer: Medicare Other | Attending: Cardiology | Admitting: *Deleted

## 2023-03-10 DIAGNOSIS — Z7901 Long term (current) use of anticoagulants: Secondary | ICD-10-CM | POA: Diagnosis not present

## 2023-03-10 DIAGNOSIS — I480 Paroxysmal atrial fibrillation: Secondary | ICD-10-CM

## 2023-03-10 LAB — POCT INR: INR: 1.7 — AB (ref 2.0–3.0)

## 2023-03-10 NOTE — Patient Instructions (Signed)
Description   Take 1.5 tablets of warfarin today and then START taking 1/2 tablet daily except 1 tablet on Sundays, Wednesday and Fridays. Recheck INR in 3 weeks (Typically 4 weeks) [facility] [phone].

## 2023-03-10 NOTE — Telephone Encounter (Signed)
Patient is returning call.  °

## 2023-03-10 NOTE — Telephone Encounter (Signed)
Patient states she has gained 4 pounds over night. When she weighed herself yesterday she was 181 and today she is 185. She denies chest pain or discomfort, shortness of breath, headache or dizziness. She states the last couple of days she does not feel the 40mg  daily of lasix has been helping because she has not gone to the bathroom as much. She states the fluid is in her abdomen. Its not painful or tender. Per DOD, Dr. Jens Som she can take an extra dose of lasix today and follow up with [name] tomorrow.

## 2023-03-10 NOTE — Progress Notes (Signed)
Cardiology Office Note:    Date:  03/11/2023   ID:  Laurie Horton, DOB [birth year], MRN [id number]  PCP:  Laurie Fick, MD  Cardiologist:  Laurie Swaziland, MD  Electrophysiologist:  None   Referring MD: Laurie Fick, MD   Chief Complaint: weight gain/ fluid retention   History of Present Illness:    Laurie Horton is a 77 y.o. female with a history of CAD with known CTO of RCA on cardiac catheterization in 03/2017 (medical therapy recommended), chronic HFrEF with EF as low as 20-25% in 2018 with subsequent normalization, paroxysmal atrial fibrillation on Coumadin, paroxysmal SVT,  PFO with no plans for closure, PAD s/p atherectomy and left iliac artery stenting in 12/2017 (also has known severe infrarenal aortic and right common iliac artery stenoses not felt to be amenable to revascularization), bilateral carotid stenosis and severe right subclavian artery stenosis, s/p right renal artery occlusion in 12/2019, thoracic aortic aneurysm, COPD on 1 to 1.5L of O2 at home, hypertension, hyperlipidemia, prior stroke, GERD, and fibromyalgia who is followed by Dr. Swaziland and presents today for follow-up of weight gain.   Patient was admitted in 01/2017 with worsening and dyspnea and found to be in atrial fibrillation with RVR. Echo showed newly reduced LVEF of 20-25%. She was diuresed and then underwent R/ LHC in 03/2017 which showed CTO of RCA, 75% of OM1, and 90% stenosis of a small Om2. Also showed mild pulmonary hypertension and normal LV filling pressures. Cardiomyopathy was felt to be ischemic in nature and GDMT was optimized. CAD was treated medically. Repeat Echo in 07/2017 showed improved EF to 50-55% with severe hypokinesis of the basal-midinferior myocardium consistent with infarction in the distribution of the RCA as well as grade 1 diastolic dysfunction. She has not had any repeat ischemic evaluation since her in 2018. he has continued to have paroxysmal atrial fibrillation since  time of initial diagnosis. She was seen in the ED in 12/2021 for rapid atrial fibrillation after presenting with palpitations but thankfully converted back to sinus rhythm while in the ED. Echo at that time showed LVEF of 55-60% with no regional wall motion abnormalities, normal RV, and  no significant valvular disease.  Outpatient monitor was ordered and showed underlying normal rhythm with atrial fibrillation burden of 2% with longest episode lasting 5 hours and 40 minutes as well as frequent PACs with short bursts of SVT (which may be PAT with variable block) and occasional PVCs with rare short runs of NSVT.   Patient also has extensive PAD. She developed significant claudication and lower extremity dopplers in 07/2017 showed right ABI of 0.9 and left ABI of0.82. She underwent peripheral angiography with Dr. Allyson Horton which showed the entire abdominal aorta was heavily calcified with at least a 75-80% stenosis in the infrarenal abdominal aorta as well as 80% of the common iliac arteries bilaterally. She was seen by Dr. Myra Horton for consideration of aortobifemoral bypass but was felt to be too high risk after pulmonary evaluation. Therefore, she underwent atherectomy and covered stenting of the left common iliac artery in 12/2017 with Dr. Allyson Horton with improvement in her claudication and ABIs. Last ABIs in 03/2021 were normal bilaterally.  Of note, the aortic and right common femoral artery stenoses have not been felt to be amenable to percutaneous therapy. She also has known carotid and right subclavian artery stenosis. She was admitted in 11/2019 after presenting with sudden loss of vision in her right eye due to  a central retinal artery occlusion.  Brain MRI showed no acute infarct but evidence of multiple old infarcts. MRA at that time also showed suspected severe stenosis of the proximal left subclavian artery with findings suggestive of subclavian steal. It was unclear whether this was symptomatic so she was managed  medically. She was started on Plavix in addition to her Coumadin at that time given extensive vascular disease (INR was only slightly subtherapeutic at 1.9 at time of this event). Last carotid ultrasound in 11/2012 showed 40-59% stenosis of left ICA and 1-39% stenosis of right ICA as well as disturbed flow in the right subclavian artery and monophasic flow consistent with known disease of left subclavian artery.   Patient was last seen by Dr. Swaziland in 10/2022 at which time she reported worsening breathing especially when lying down but no increased edema and weight was stable. O2 requirements had also not changed. She was still complaining of lower left sternum pain that was felt to be multifactorial in nature. She was continued on current medications.   Patient sent [place] a MyChart message on 03/10/2023 with reports of a 4lb weight gain overnight. She was advised to take an extra dose of her Lasix and this visit was arranged for further evaluation. She reports her weight was down 2 lbs earlier this morning after taking the extra dose of Lasix. Her dry weight is around 178-181 lbs. She weighs 183 lbs in the office today.  She describes some abdominal distension with the weight gain. This has also improved with the extra dose of Lasix but not quite back to baseline. She feel like this distension has caused her abdomen to push up on her diaphragm making it harder to breath but she denies any significant worsening of her chronic shortness of breath. She has stable orthopnea. No PND. She has chronic swelling in her left knee from prior surgery but no other lower extremity edema. She continues to have episodes of atrial fibrillation and she is symptomatic when this happens. She reports she had an episode this past weekend that only lasted about 15 minutes. Episode aren't occurring more frequently than usual. She describes some dizziness with quick positions but no syncope. No chest pain. She does continue to have what of  atypical musculoskeletal pain under bilateral breast as well as on the mid left side of back. Pain is reproducible with palpation of these areas. She states she has been told that she has costochondritis before. She continues to have claudication (may be slightly worse but sounds mostly stable).  Past Medical History:  Diagnosis Date   Acute diastolic heart failure, NYHA class 1 (HCC) 02/08/2017   Acute heart failure (HCC) 02/08/2017   Cataract    OD   CHF (congestive heart failure) (HCC) 03/02/2017   EF 25-30% 2018   Complication of anesthesia    various issues with oxygen  saturations post op   COPD (chronic obstructive pulmonary disease) (HCC)    Depression    Depression    [employer]rees[employer] 11/04/2020   Diverticulitis    DVT (deep venous thrombosis) (HCC)    Fibromyalgia    GERD (gastroesophageal reflux disease)    Heart murmur    [employer]reesia 11/04/2020   Hiatal hernia    History of deviated nasal septum    left- side   HTN (hypertension)    Hyperlipidemia    Hypertension    [employer]reesia 11/04/2020   Hypertensive retinopathy    OU   Myocardial infarction [facility])    [employer]reesia 11/04/2020   Obesity  On supplemental oxygen therapy    concentrator at night @ 1.5 l/m or when sleeps. O2 Sat niormally 87.   Osteoporosis    Osteoporosis    Phreesia 11/04/2020   Oxygen deficiency    [employer]reesia 11/04/2020   PAT (paroxysmal atrial tachycardia)    PFO (patent foramen ovale)    PULMONARY NODULE, LEFT LOWER LOBE 10/14/2009   5mm LLL nodule dec 2010. Stable and 4mm in Oct 2012. 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 Persists on cxr 07/22/11. Cleared on CT 08/24/11. No further followup   Stroke [facility])    TOBACCO ABUSE 06/04/2009    Past Surgical History:  Procedure Laterality Date   ABDOMINAL HYSTERECTOMY N/A    [employer]rees[employer] 11/04/2020   CARDIOVASCULAR STRESS TEST  12/26/2004   EF 74%. NO  EVIDENCE OF ISCHEMIA   CATARACT EXTRACTION Left    Dr. Hortense Ramal   ESOPHAGOGASTRODUODENOSCOPY (EGD) WITH PROPOFOL N/A 04/15/2015   Procedure: ESOPHAGOGASTRODUODENOSCOPY (EGD) WITH PROPOFOL;  Surgeon: Carman Ching, MD;  Location: [facility] ENDOSCOPY;  Service: Endoscopy;  Laterality: N/A;   EYE SURGERY Left    Cat Sx   JOINT REPLACEMENT N/A    [employer]reesia 11/04/2020   KNEE ARTHROSCOPY  2000   left   LAPAROSCOPIC CHOLECYSTECTOMY  04-16-2010   [facility]   LOWER EXTREMITY ANGIOGRAPHY N/A 09/09/2017   Procedure: Lower Extremity Angiography;  Surgeon: Runell Gess, MD;  Location: [facility] INVASIVE CV LAB;  Service: Cardiovascular;  Laterality: N/A;   LOWER EXTREMITY INTERVENTION Left 01/17/2018   Procedure: LOWER EXTREMITY INTERVENTION;  Surgeon: Runell Gess, MD;  Location: [facility] INVASIVE CV LAB;  Service: Cardiovascular;  Laterality: Left;   MOUTH SURGERY     03-26-15 multiple extractions stitches remains   PERIPHERAL VASCULAR INTERVENTION Left 01/17/2018   Procedure: PERIPHERAL VASCULAR INTERVENTION;  Surgeon: Runell Gess, MD;  Location: [facility] INVASIVE CV LAB;  Service: Cardiovascular;  Laterality: Left;  COMMON ILIAC   RIGHT/LEFT HEART CATH AND CORONARY ANGIOGRAPHY N/A 03/04/2017   Procedure: Right/Left Heart Cath and Coronary Angiography;  Surgeon: Laurie M Swaziland, MD;  Location: [facility] INVASIVE CV LAB;  Service: Cardiovascular;  Laterality: N/A;   TOTAL ABDOMINAL HYSTERECTOMY     post op needed oxygen was told "she gave them a scare"   TUBAL LIGATION     US ECHOCARDIOGRAPHY  11/20/2009   EF 55-60%    Current Medications: Current Meds  Medication Sig   Alirocumab (PRALUENT) 75 MG/ML SOAJ INJECT 75 MG INTO THE SKIN EVERY 14 (FOURTEEN) DAYS.   carvedilol (COREG) 12.5 MG tablet TAKE 1 TABLET BY MOUTH 2 TIMES DAILY.   clopidogrel (PLAVIX) 75 MG tablet TAKE 1 TABLET BY MOUTH EVERY DAY   desvenlafaxine (PRISTIQ) 25 MG 24 hr tablet Take 1 tablet (25 mg total) by mouth daily.   diclofenac sodium (VOLTAREN) 1 %  GEL Apply 2 g topically 4 (four) times daily.   estradiol (CLIMARA - DOSED IN MG/24 HR) 0.1 mg/24hr patch Place 0.1 mg onto the skin once a week.   estradiol (ESTRACE) 0.1 MG/GM vaginal cream Place 1 Applicatorful vaginally every 14 (fourteen) days.   Eyelid Cleansers (OCUSOFT BABY EYELID & EYELASH EX) Apply topically.   furosemide (LASIX) 40 MG tablet TAKE 1 TABLET BY MOUTH EVERY DAY   gabapentin (NEURONTIN) 100 MG capsule Take 200 mg by mouth See admin instructions. Take 1 in am and 1 in the lunch and  2 at bedtime   HYDROcodone-acetaminophen (NORCO/VICODIN) 5-325 MG tablet Take 1 tablet by mouth in the morning and at bedtime.   ipratropium (ATROVENT) 0.02 % nebulizer solution Take 1.25 mLs (0.25 mg total) by nebulization every 4 (four) hours as needed for wheezing or shortness of breath. [name]   lidocaine (LIDODERM) 5 % Place 1-3 patches onto the skin as directed.   montelukast (SINGULAIR) 10 MG tablet TAKE 1 TABLET BY MOUTH EVERYDAY AT BEDTIME (Patient taking differently: Take 10 mg by mouth daily.)   nitroGLYCERIN (NITROSTAT) 0.4 MG SL tablet PLACE 1 TABLET UNDER THE TONGUE EVERY 5 MINUTES AS NEEDED FOR CHEST PAIN.   ondansetron (ZOFRAN-ODT) 8 MG disintegrating tablet Take 1 tablet (8 mg total) by mouth every 8 (eight) hours as needed for nausea or vomiting.   OXYGEN Inhale 1.5-3 L into the lungs as needed (for shortness of breath).   pantoprazole (PROTONIX) 40 MG tablet TAKE 1 TABLET BY MOUTH EVERY DAY   polyethylene glycol (MIRALAX / GLYCOLAX) packet Take 17 g by mouth daily as needed for mild constipation.    spironolactone (ALDACTONE) 25 MG tablet TAKE 1 TABLET BY MOUTH EVERY DAY   traZODone (DESYREL) 100 MG tablet Take one tab daily as needed for sleep   warfarin (COUMADIN) 5 MG tablet TAKE 1/2 TO 1 TABLET BY MOUTH DAILY OR AS DIRECTED BY CLINIC     Allergies:   Alprazolam, Bee venom, Iodine, Pseudoephedrine hcl er, Budesonide-formoterol fumarate, Crestor [rosuvastatin  calcium], Esomeprazole magnesium, Flonase [fluticasone propionate], Lamictal [lamotrigine], Loratadine, Lotrimin [clotrimazole], Lunesta [eszopiclone], Oxcarbazepine, Statins, Zolpidem tartrate, Betadine [povidone iodine], Bevespi aerosphere [glycopyrrolate-formoterol], Breztri aerosphere [budeson-glycopyrrol-formoterol], Clarithromycin, Effexor [venlafaxine], Lexapro [escitalopram oxalate], Aciphex [rabeprazole sodium], Alendronate sodium, Avelox [moxifloxacin hcl in nacl], Bextra [valdecoxib], Ceclor [cefaclor], Cephalexin, Covera-hs [verapamil hcl], Dicyclomine hcl, Other, and Tessalon perles   Social History   Socioeconomic History   Marital status: Divorced    Spouse name: Not on file   Number of children: 2   Years of education: Not on file   Highest education level: Not on file  Occupational History   Occupation: disability  Tobacco Use   Smoking status: Former    Packs/day: 3.00    Years: 30.00    Additional pack years: 0.00    Total pack years: 90.00    Types: Cigarettes    Quit date: 06/02/2010    Years since quitting: 12.7   Smokeless tobacco: Never   Tobacco comments:    QUIT IN 2011  Vaping Use   Vaping Use: Never used  Substance and Sexual Activity   Alcohol use: No    Alcohol/week: 0.0 standard drinks of alcohol   Drug use: No   Sexual activity: Yes    Partners: Male    Birth control/protection: Post-menopausal, Surgical  Other Topics Concern   Not on file  Social History Narrative   Marital status: divorced in 1978 after ten years; dating casually in 2019.      Children: 3 biological children; 3 court appointed children; 6 grandchildren; 5 gg      Lives: alone; children in [place] and [place].      Employment: retired age 58; disability for COPD, CVA at age 30.      Tobacco: former smoker; quit smoking 2011.      Alcohol: none      Exercise: walks dog four times per day; goes to pool three times per week.      ADLs: drives; no assistant devices; does have  a walker.  Cleaning is limited in 2018.  Daughter helps with cleaning.  Does own grocery shopping.      Advanced Directives: YES; DNR/DNI; HCPOA: [name] Martin/daughter youngest.  Blind.               Social Determinants of Health   Financial Resource Strain: Not on file  Food Insecurity: Not on file  Transportation Needs: Not on file  Physical Activity: Not on file  Stress: Not on file  Social Connections: Not on file     Family History: The patient's family history includes Alzheimer's disease in her mother; Dementia in her mother; Diabetes in her mother and sister; Heart attack in her father; Heart attack (age of onset: 27) in her brother; Schizophrenia in her sister; Tremor in her sister.  ROS:   Please see the history of present illness.     EKGs/Labs/Other Studies Reviewed:    The following studies were reviewed:  Right/Left Cardiac Catheterization 03/04/2017: Ost 1st Mrg to 1st Mrg lesion, 75 %stenosed. 2nd Mrg lesion, 90 %stenosed. Ost RCA to Mid RCA lesion, 100 %stenosed. There is severe left ventricular systolic dysfunction. LV end diastolic pressure is normal. The left ventricular ejection fraction is 25-35% by visual estimate. Hemodynamic findings consistent with mild pulmonary hypertension. LV end diastolic pressure is normal.   1. Severe 2 vessel obstructive CAD    - 75% proximal OM1    - 90% small OM2    - 100% proximal RCA. Left to right collaterals.  2. Severe LV dysfunction 3. Mild pulmonary HTN with normal LV filling pressures.  4. Cardiac index 2.41 L/min/BSA     Plan: Medical management to try and optimize CHF therapy. Patient appears to be adequately diuresed at this time. Based on these results her cardiomyopathy is ischemic. I would treat her CAD medically. If cardiac cath is needed in the future would consider alternative access given difficulty from the right radial approach. Her rhythm during procedure is a multifocal atrial  rhythm/tachycardia.  _______________  ABIs/ TBIs 03/06/2021: Summary:  Right: Resting right ankle-brachial index is within normal range. No  evidence of significant right lower extremity arterial disease. The right  toe-brachial index is abnormal.   Left: Resting left ankle-brachial index is within normal range. No  evidence of significant left lower extremity arterial disease. The left  toe-brachial index is abnormal.   _______________  Echocardiogram 12/06/2021: Impressions: 1. Left ventricular ejection fraction, by estimation, is 55 to 60%. The  left ventricle has normal function. The left ventricle has no regional  wall motion abnormalities. There is mild left ventricular hypertrophy.  Left ventricular diastolic parameters  were normal.   2. Right ventricular systolic function is normal. The right ventricular  size is normal.   3. Left atrial size was mildly dilated.   4. The mitral valve is normal in structure. No evidence of mitral valve  regurgitation. No evidence of mitral stenosis.   5. The aortic valve is tricuspid. There is mild calcification of the  aortic valve. There is mild thickening of the aortic valve. Aortic valve  regurgitation is not visualized. Aortic valve sclerosis is present, with  no evidence of aortic valve stenosis.   6. The inferior vena cava is normal in size with greater than 50%  respiratory variability, suggesting right atrial pressure of 3 mmHg.   EKG:  EKG ordered today. EKG personally reviewed and demonstrates normal sinus rhythm, rate 66 bpm, with PAC as well as some underlying artifact and non-specific T wave changes.  Normal axis. Normal PR and QRS intervals. QTc 454 ms.  _______________  Monitor 12/12/2021 to 12/26/2021: Normal sinus rhythm Atrial fibrillation with RVR on 12/19/21 and 12/21/21 with longest episode lasting 5 hours and 40 minutes and average HR 120 bpm. AFib burden 2% Frequent PACs with few short bursts of SVT longest lasting 15  beats Occasional PVCs with rare NSVT longest lasting 5 beats. Symptoms appear to correlate with Afib an SVT.     Patch Wear Time:  13 days and 23 hours (2023-02-10T10:12:14-0500 to 2023-02-24T09:21:34-0500)   Patient had a min HR of 43 bpm, max HR of 235 bpm, and avg HR of 71 bpm. Predominant underlying rhythm was Sinus Rhythm. First Degree AV Block was present. 2 Ventricular Tachycardia runs occurred, the run with the fastest interval lasting 5 beats with a  max rate of 235 bpm (avg 156 bpm); the run with the fastest interval was also the longest. 7 Supraventricular Tachycardia runs occurred, the run with the fastest interval lasting 4 beats with a max rate of 176 bpm, the longest lasting 15 beats with an  avg rate of 128 bpm. Some episodes of Supraventricular Tachycardia may be possible Atrial Tachycardia with variable block. Atrial Fibrillation occurred (2% burden), ranging from 75-167 bpm (avg of 120 bpm), the longest lasting 5 hours 40 mins with an avg  rate of 116 bpm. Supraventricular Tachycardia and Atrial Fibrillation were detected within +/- 45 seconds of symptomatic patient event(s). Isolated SVEs were frequent (11.5%, [id number]), SVE Couplets were frequent (5.5%, [id number]), and SVE Triplets were  occasional (1.7%, 8129). Isolated VEs were occasional (4.5%, [id number]), VE Couplets were rare (<1.0%, 596), and VE Triplets were rare (<1.0%, 41). Ventricular Bigeminy and Trigeminy were present. _______________  Carotid Ultrasound 11/27/2022: Summary: - Right Carotid: Velocities in the right ICA are consistent with a 1-39%  stenosis. Non-hemodynamically significant plaque <50% noted in the  CCA.  - Left Carotid: Velocities in the left ICA are consistent with a 40-59%  stenosis. Non-hemodynamically significant plaque <50% noted in the  CCA. The ECA appears >50% stenosed.  - Vertebrals:  Right vertebral artery demonstrates antegrade flow. Left  Vertebral artery demonstrates retrograde flow.  -  Subclavians: Right subclavian artery flow was disturbed. Left subclavian  artery demonstrates monophasic flow consistent with known disease.  50 mmHg pressure gradient between the arms with the left being the  lowest.    Recent Labs: No results found for requested labs within last 365 days.  Recent Lipid Panel    Component Value Date/Time   CHOL 132 07/02/2021 1017   CHOL 115 09/17/2020 0946   TRIG 220.0 (H) 07/02/2021 1017   HDL 41.80 07/02/2021 1017   HDL 36 (L) 09/17/2020 0946   CHOLHDL 3 07/02/2021 1017   VLDL 44.0 (H) 07/02/2021 1017   LDLCALC 52 09/17/2020 0946   LDLDIRECT 67.0 07/02/2021 1017    Physical Exam:    Vital Signs: BP 128/70   Pulse 66   Ht 5\' 4"  (1.626 m)   Wt 183 lb (83 kg)   SpO2 91%   BMI 31.41 kg/m     Wt Readings from Last 3 Encounters:  03/11/23 183 lb (83 kg)  01/15/23 180 lb (81.6 kg)  01/06/23 181 lb (82.1 kg)     General: 77 y.o.  obese Caucasian female in no acute distress. HEENT: Normocephalic and atraumatic. Sclera clear.  Neck: Supple. Noticeable external jugular vein but no significant JVD.  Heart: RRR. Distinct S1 and S2. No murmurs, gallops, or rubs.  Radial  pulses 2+ and equal bilaterally. Lungs: No increased work of breathing. Clear to ausculation bilaterally. No wheezes, rhonchi, or rales.  Abdomen: Soft, non-distended, and non-tender to palpation.  Extremities: Left knee edema (from prior surgery) but no other lower extremity edema.    Skin: Warm and dry. Neuro: Alert and oriented x3. No focal deficits. Psych: Normal affect. Responds appropriately.  Assessment:    1. Chronic HFrEF with Recovered EF   2. Ischemic cardiomyopathy   3. Coronary artery disease involving native coronary artery of native heart without angina pectoris   4. Paroxysmal atrial fibrillation (HCC)   5. Paroxysmal SVT (supraventricular tachycardia)   6. Thoracic aortic aneurysm without rupture, unspecified part (HCC)   7. PAD (peripheral artery  disease) (HCC)   8. Bilateral carotid artery stenosis   9. Subclavian artery stenosis, right (HCC)   10. CRAO (central retinal artery occlusion), right   11. History of CVA (cerebrovascular accident)   24. Primary hypertension   13. Hyperlipidemia, unspecified hyperlipidemia type   14. PFO (patent foramen ovale)     Plan:    Chronic HFrEF with Recovered EF Ischemic Cardiomyopathy LVEF as low as 20-25% in 01/2017. R/LHC in 03/2017 showed severe 2 vessel CAD which was treated medically. Cardiomypathy was felt to be ischemic in nature and she was started on GDMT with normalization of EF. Last Echo in 12/2021 showed LVEF of 55-60% and normal RV.  - Patient reported 4lb weight gain overnight yesterday. She took an extra dose of Lasix yesterday and weight down 2 lbs from yesterday.  - Does not appear significantly volume overloaded on exam. - Will check BNP and BMET. If BNP significant elevated, will repeat Echo.  - Will increase Lasix to 80mg  for 2 more days. She can then go back to 40mg  daily but can take an extra 40mg  as needed for weight gain (3lbs in 1 day or 5lbs in 1 week) or worsening edema.   - Continue Spironolactone 25mg  daily.  - Continue Coreg 12.5mg  twic daily.  - Previously on ARB but this was discontinued due to orthostatic hypotension.  - Discussed adding SLGT2 inhibitor but she declined.  - Continue daily weights and sodium/fluid restrictions.   CAD  LHC in 03/2017 showed CTO of RCA, 75% of OM1, and 90% stenosis of a small O2. Medical therapy was recommended at that time.  - No angina.  - Continue Plavix 75mg  daily.  - Intolerant to statin. On Praluent.   Paroxysmal Atrial Fibrillation Paroxysmal SVT Monitor in 12/2021 showed underlying normal rhythm with atrial fibrillation burden of 2% with longest episode lasting 5 hours and 40 minutes as well as frequent PACs with short bursts of SVT (which may be PAT with variable block) and occasional PVCs with rare short runs of NSVT.   - Maintaining sinus rhythm.  - Continue Coreg 12.5mg  twice daily.  - Continue chronic anticoagulation with Coumadin. Followed by our Coumadin Clinic.   Thoracic Aortic Aneurysm Chest CT in 08/2022 showed fusiform aneurysmal dilatation of the ascending aorta measuring 4.1 cm.  - Not felt to be a candidate for aortic surgery.   PAD S/p atherectomy and covered stenting of left common iliac artery in 2019. She also has known severe infrarenal aortic and right common iliac artery stenosis but not felt to be amenable to percutaneous intervention and patient felt to be too high risk fo aortobifemoral bypass. Last ABIs were normal bilaterall in 03/2021.  - Continues to have claudication. May be slightly worse but sounds  mostly stable. - Continue Plavix and Praluent.  - Will repeat ABIs.   Carotid Artery Stenosis Severe Right Subclavian Artery Stenosis MRA in 2021 showed suspected severe stenosis of the proximal left subclavian artery with findings suggestive of subclavian steal. It was unclear whether this was symptomatic so she was managed medically. Last carotid ultrasounds in 11/2022 showed 40-59% stenosis of left ICA and 1-39% stenosis of right ICA as well as disturbed flow in the right subclavian artery and monophasic flow consistent with known disease of left subclavian artery.  - Continue Plavix and Praluent.  - Will need repeat dopplers in 11/2023.   Right Retinal Artery Occlusion History of CVAs Admitted in 12/2019 with right retinal artery occlusion after sudden loss of vision in right eye. Brain MRI at that time showed no acute infarct but multiple old infarcts. She was started on Plavix in addition to Coumadin at that time.  - Continue Plavix and Coumadin. - Continue Praluent.   Hypertension BP well controlled.  - Continue medications for CHF.   Hyperlipidemia  Lipid panel 64 in 03/2022. - Intolerant to statins. Continue Praluent.  - Labs followed by PCP.  PFO - No plans for  closure.   Disposition: Patient already has a follow-up visit with Dr. Swaziland scheduled for 07/2023. Will keep this.    Medication Adjustments/Labs and Tests Ordered: Current medicines are reviewed at length with the patient today.  Concerns regarding medicines are outlined above.  No orders of the defined types were placed in this encounter.  No orders of the defined types were placed in this encounter.   There are no Patient Instructions on file for this visit.   Signed, Corrin Parker, PA-C  03/11/2023 8:26 AM    [facility]

## 2023-03-10 NOTE — Telephone Encounter (Signed)
Left voicemail for patient to return call to office. 

## 2023-03-11 ENCOUNTER — Ambulatory Visit: Payer: Medicare Other | Attending: Student | Admitting: Student

## 2023-03-11 ENCOUNTER — Encounter: Payer: Self-pay | Admitting: Student

## 2023-03-11 VITALS — BP 128/70 | HR 66 | Ht 64.0 in | Wt 183.0 lb

## 2023-03-11 DIAGNOSIS — H3411 Central retinal artery occlusion, right eye: Secondary | ICD-10-CM

## 2023-03-11 DIAGNOSIS — E785 Hyperlipidemia, unspecified: Secondary | ICD-10-CM

## 2023-03-11 DIAGNOSIS — I480 Paroxysmal atrial fibrillation: Secondary | ICD-10-CM

## 2023-03-11 DIAGNOSIS — I10 Essential (primary) hypertension: Secondary | ICD-10-CM

## 2023-03-11 DIAGNOSIS — I6523 Occlusion and stenosis of bilateral carotid arteries: Secondary | ICD-10-CM

## 2023-03-11 DIAGNOSIS — I771 Stricture of artery: Secondary | ICD-10-CM

## 2023-03-11 DIAGNOSIS — I739 Peripheral vascular disease, unspecified: Secondary | ICD-10-CM

## 2023-03-11 DIAGNOSIS — Z8673 Personal history of transient ischemic attack (TIA), and cerebral infarction without residual deficits: Secondary | ICD-10-CM

## 2023-03-11 DIAGNOSIS — I255 Ischemic cardiomyopathy: Secondary | ICD-10-CM

## 2023-03-11 DIAGNOSIS — R609 Edema, unspecified: Secondary | ICD-10-CM

## 2023-03-11 DIAGNOSIS — I2510 Atherosclerotic heart disease of native coronary artery without angina pectoris: Secondary | ICD-10-CM | POA: Diagnosis not present

## 2023-03-11 DIAGNOSIS — Q2112 Patent foramen ovale: Secondary | ICD-10-CM

## 2023-03-11 DIAGNOSIS — I5022 Chronic systolic (congestive) heart failure: Secondary | ICD-10-CM | POA: Diagnosis not present

## 2023-03-11 DIAGNOSIS — I471 Supraventricular tachycardia, unspecified: Secondary | ICD-10-CM

## 2023-03-11 DIAGNOSIS — I712 Thoracic aortic aneurysm, without rupture, unspecified: Secondary | ICD-10-CM

## 2023-03-11 NOTE — Patient Instructions (Signed)
Medication Instructions:   INCREASE Lasix to 40 mg 2 times a day for 2 days, then resume Lasix 40 mg daily on day 3 forward. May take an extra tablet for weight gain of 3 lbs overnight, 5 lbs in a week and/or worsen of edema (swelling).  *If you need a refill on your cardiac medications before your next appointment, please call your pharmacy*  Lab Work: Marjie Skiff, PA-C recommends that you have lab work TODAY:  BMP BNP  If you have labs (blood work) drawn today and your tests are completely normal, you will receive your results only by: MyChart Message (if you have MyChart) OR A paper copy in the mail If you have any lab test that is abnormal or we need to change your treatment, we will call you to review the results.  Testing/Procedures: Your physician has requested that you have an ankle brachial index (ABI). During this test an ultrasound and blood pressure cuff are used to evaluate the arteries that supply the arms and legs with blood. Allow thirty minutes for this exam. There are no restrictions or special instructions.  Your physician has requested that you have a lower extremity arterial exercise duplex. During this test, exercise and ultrasound are used to evaluate arterial blood flow in the legs. Allow one hour for this exam. There are no restrictions or special instructions.   Follow-Up: At [facility], you and your health needs are our priority.  As part of our continuing mission to provide you with exceptional heart care, we have created designated Provider Care Teams.  These Care Teams include your primary Cardiologist (physician) and Advanced Practice Providers (APPs -  Physician Assistants and Nurse Practitioners) who all work together to provide you with the care you need, when you need it.   Your next appointment:   As previously scheduled   Provider:   Peter Swaziland, MD     Other Instructions

## 2023-03-12 ENCOUNTER — Ambulatory Visit
Admission: RE | Admit: 2023-03-12 | Discharge: 2023-03-12 | Disposition: A | Discharge status: Home / Self Care | Payer: Medicare Other | Source: Ambulatory Visit | Attending: Physical Medicine and Rehabilitation | Admitting: Physical Medicine and Rehabilitation

## 2023-03-12 DIAGNOSIS — M48061 Spinal stenosis, lumbar region without neurogenic claudication: Secondary | ICD-10-CM

## 2023-03-12 LAB — BASIC METABOLIC PANEL
BUN/Creatinine Ratio: 18 (ref 12–28)
BUN: 17 mg/dL (ref 8–27)
CO2: 30 mmol/L — ABNORMAL HIGH (ref 20–29)
Calcium: 10.6 mg/dL — ABNORMAL HIGH (ref 8.7–10.3)
Chloride: 95 mmol/L — ABNORMAL LOW (ref 96–106)
Creatinine, Ser: 0.94 mg/dL (ref 0.57–1.00)
Glucose: 104 mg/dL — ABNORMAL HIGH (ref 70–99)
Potassium: 4.3 mmol/L (ref 3.5–5.2)
Sodium: 139 mmol/L (ref 134–144)
eGFR: 62 mL/min/{1.73_m2} (ref >59)

## 2023-03-12 LAB — BRAIN NATRIURETIC PEPTIDE: BNP: 42.5 pg/mL (ref 0.0–100.0)

## 2023-03-13 ENCOUNTER — Encounter: Payer: Self-pay | Admitting: Internal Medicine

## 2023-03-14 NOTE — Telephone Encounter (Signed)
  Sje has multiple allergies I suggest we ask her first and double check  1) what antibiotic does sh eprefer? 2) is she ok with 5d steroids? She typically does not want to do prednisome  Allergies  Allergen Reactions   Alprazolam Anaphylaxis and Other (See Comments)    REACTION: stops breathing   Bee Venom Anaphylaxis   Iodine Anaphylaxis, Swelling and Other (See Comments)    REACTION: swelling in throat   Pseudoephedrine Hcl Er Shortness Of Breath   Budesonide-Formoterol Fumarate Other (See Comments)    Blisters inside of mouth all over   Crestor [Rosuvastatin Calcium] Other (See Comments)    Unable to walk   Esomeprazole Magnesium Other (See Comments)    REACTION: "bouncing off walls"   Flonase [Fluticasone Propionate] Other (See Comments)    NOSE BLEED   Lamictal [Lamotrigine] Rash    Patient got rash, labored breathing, and diarrhea   Loratadine Other (See Comments)    claritin D causes shaking   Lotrimin [Clotrimazole] Other (See Comments)    Mouth blisters   Lunesta [Eszopiclone] Other (See Comments)    REACTION: "slept for a week"   Oxcarbazepine Other (See Comments)    Causes deep sleep and dizziness   Statins Other (See Comments)    Can't walk, legs won't work    Zolpidem Tartrate Other (See Comments)    REACTION: "slept for a week"   Betadine [Povidone Iodine] Other (See Comments)    Breathing problems   Bevespi Aerosphere [Glycopyrrolate-Formoterol] Other (See Comments)    Pt believes this caused mouth sores and thrush    Breztri Aerosphere [Budeson-Glycopyrrol-Formoterol] Other (See Comments)    Thrush   Clarithromycin Other (See Comments)    All "mycins", Puts into "a" fib, Will take if has to for severe sinus infection   Effexor [Venlafaxine] Nausea And Vomiting and Other (See Comments)    cramps   Lexapro [Escitalopram Oxalate] Other (See Comments)    hallucinations   Aciphex [Rabeprazole Sodium] Rash   Alendronate Sodium Other (See Comments)    "caused  stomach problems for 3 days"   Avelox [Moxifloxacin Hcl In Nacl] Other (See Comments)    Stomach cramps.    Bextra [Valdecoxib] Rash   Ceclor [Cefaclor] Rash   Cephalexin Rash and Other (See Comments)    Pt states that she is possibly allergic to this - had a reaction to Cefaclor in the past and she does not want to these class drugs. Added per patient request.   Covera-Hs [Verapamil Hcl] Palpitations   Dicyclomine Hcl Rash   Other Other (See Comments)    Glue from ekg/heart monitor leads --rash, Any MYCINS   Tessalon Perles Rash

## 2023-03-14 NOTE — Telephone Encounter (Signed)
Laurie Horton  to [facility] (supporting [name], MD)      03/13/23  2:49 PM Dr at my last visit you wanted to order me meds for my infection.  I said no but it is still the same.  Please go ahead and order medications for me to help me get rid of it.  I have appointment scheduled with you in July.   Thank you, Laurie Horton Write   I tried calling to get more info and there was no answer- Pt asking for abx and pred, stating symptoms never improved after lov.  Allergies  Allergen Reactions   Alprazolam Anaphylaxis and Other (See Comments)    REACTION: stops breathing   Bee Venom Anaphylaxis   Iodine Anaphylaxis, Swelling and Other (See Comments)    REACTION: swelling in throat   Pseudoephedrine Hcl Er Shortness Of Breath   Budesonide-Formoterol Fumarate Other (See Comments)    Blisters inside of mouth all over   Crestor [Rosuvastatin Calcium] Other (See Comments)    Unable to walk   Esomeprazole Magnesium Other (See Comments)    REACTION: "bouncing off walls"   Flonase [Fluticasone Propionate] Other (See Comments)    NOSE BLEED   Lamictal [Lamotrigine] Rash    Patient got rash, labored breathing, and diarrhea   Loratadine Other (See Comments)    claritin D causes shaking   Lotrimin [Clotrimazole] Other (See Comments)    Mouth blisters   Lunesta [Eszopiclone] Other (See Comments)    REACTION: "slept for a week"   Oxcarbazepine Other (See Comments)    Causes deep sleep and dizziness   Statins Other (See Comments)    Can't walk, legs won't work    Zolpidem Tartrate Other (See Comments)    REACTION: "slept for a week"   Betadine [Povidone Iodine] Other (See Comments)    Breathing problems   Bevespi Aerosphere [Glycopyrrolate-Formoterol] Other (See Comments)    Pt believes this caused mouth sores and thrush    Breztri Aerosphere [Budeson-Glycopyrrol-Formoterol] Other (See Comments)    Thrush   Clarithromycin Other (See Comments)    All "mycins", Puts into  "a" fib, Will take if has to for severe sinus infection   Effexor [Venlafaxine] Nausea And Vomiting and Other (See Comments)    cramps   Lexapro [Escitalopram Oxalate] Other (See Comments)    hallucinations   Aciphex [Rabeprazole Sodium] Rash   Alendronate Sodium Other (See Comments)    "caused stomach problems for 3 days"   Avelox [Moxifloxacin Hcl In Nacl] Other (See Comments)    Stomach cramps.    Bextra [Valdecoxib] Rash   Ceclor [Cefaclor] Rash   Cephalexin Rash and Other (See Comments)    Pt states that she is possibly allergic to this - had a reaction to Cefaclor in the past and she does not want to these class drugs. Added per patient request.   Covera-Hs [Verapamil Hcl] Palpitations   Dicyclomine Hcl Rash   Other Other (See Comments)    Glue from ekg/heart monitor leads --rash, Any MYCINS   Tessalon Perles Rash

## 2023-03-15 NOTE — Telephone Encounter (Signed)
Looks like her CO2 level on BMET is at her baseline but she should keep her follow-up visit with Pulmonology (Dr. Marchelle Gearing) as scheduled .  Thank you!

## 2023-03-15 NOTE — Telephone Encounter (Signed)
I reviewed labs. The BNP (the lab that helps assess volume/ fluid status) is normal which is good. Calcium level is elevated but otherwise labs are stable. Kidney function is normal. Chloride level is only slightly low (1 unit below lower end of normal) but is at her baseline and I don't think this is clinically significant. CO2 is at baseline. I would recommend she follow-up with her PCP in regards to her elevated calcium level but no new cardiac recommendations at this time. Continue Lasix 40mg  daily but she can continue to take an extra dose of Lasix 40mg  as needed for weight gain (3lbs in 1 day or 5lbs in 1 week) or worsening edema.    Thank you!

## 2023-03-15 NOTE — Telephone Encounter (Signed)
Dr. Marchelle Gearing here are the answers to your questiosns.   Laurie Horton [facility] (supporting [name], [place] hours ago (4:53 PM)    The good doctor usually gives me Prednisone for 5 and then again for another round.  Because of the rules he stated he can only give me 5 days at a time.  But Biaxin is what I take as well even though I get bad cramps.  I just tired of being sick.  Thank you.  Please advise.

## 2023-03-16 NOTE — Telephone Encounter (Signed)
Please take prednisone 40 mg x1 day, then 30 mg x1 day, then 20 mg x1 day, then 10 mg x1 day, and then 5 mg x1 day and stop   Biaxin 500mg  bid x 5 days

## 2023-03-18 ENCOUNTER — Encounter: Payer: Self-pay | Admitting: Internal Medicine

## 2023-03-18 MED ORDER — CLARITHROMYCIN 500 MG PO TABS: 500.0000 mg | ORAL_TABLET | Freq: Two times a day (BID) | ORAL | 0 refills | Status: DC

## 2023-03-18 MED ORDER — PREDNISONE 10 MG PO TABS: ORAL_TABLET | ORAL | 0 refills | Status: AC

## 2023-03-18 NOTE — Telephone Encounter (Signed)
Please refer to message from 5/11.

## 2023-03-19 ENCOUNTER — Telehealth: Payer: Self-pay

## 2023-03-19 NOTE — Telephone Encounter (Signed)
ReceiveClarithromycin and Prednisone and took her first dose this morning, 03/19/23.   - Clarithromycin 500mg  BID x days - Prednisone 4 tablets (40 mg total) by mouth daily with breakfast for 1 day, THEN 3 tablets (30 mg total) daily with breakfast for 1 day, THEN 2 tablets (20 mg total) daily with breakfast for 1 day, THEN 1 tablet (10 mg total) daily with breakfast for 1 day, THEN 0.5 tablets (5 mg total) daily with breakfast for 1 day.   Advised pt to come to Coumadin Clinic Monday morning to have INR checked; however, pt stated she couldn't come until Tuesday. Scheduled pt a Coumadin Clinic appt on 03/23/23 at 8:30am and advised pt to eat green leafy vegetables (Spinach, broccoli. Collards) over the weekend to prevent INR from being elevated d/t prednisone and abx.  Pt verbalized understanding.

## 2023-03-23 ENCOUNTER — Ambulatory Visit: Payer: Medicare Other | Attending: Cardiology

## 2023-03-23 DIAGNOSIS — Z7901 Long term (current) use of anticoagulants: Secondary | ICD-10-CM | POA: Diagnosis not present

## 2023-03-23 DIAGNOSIS — I480 Paroxysmal atrial fibrillation: Secondary | ICD-10-CM

## 2023-03-23 LAB — POCT INR: INR: 2.5 (ref 2.0–3.0)

## 2023-03-23 NOTE — Patient Instructions (Signed)
Continue taking 1/2 tablet daily except 1 tablet on Monday, Wednesday and Fridays. Recheck INR in 4 weeks (Typically 4 weeks) Coumadin Clinic [phone].

## 2023-03-24 ENCOUNTER — Telehealth (HOSPITAL_BASED_OUTPATIENT_CLINIC_OR_DEPARTMENT_OTHER): Payer: Medicare Other | Admitting: Psychiatry

## 2023-03-24 ENCOUNTER — Encounter (HOSPITAL_COMMUNITY): Payer: Self-pay | Admitting: Psychiatry

## 2023-03-24 VITALS — Wt 183.0 lb

## 2023-03-24 DIAGNOSIS — F331 Major depressive disorder, recurrent, moderate: Secondary | ICD-10-CM

## 2023-03-24 DIAGNOSIS — F411 Generalized anxiety disorder: Secondary | ICD-10-CM

## 2023-03-24 MED ORDER — TRAZODONE HCL 100 MG PO TABS: ORAL_TABLET | ORAL | 2 refills | Status: DC

## 2023-03-24 MED ORDER — DESVENLAFAXINE SUCCINATE ER 25 MG PO TB24: 25.0000 mg | ORAL_TABLET | Freq: Every day | ORAL | 2 refills | Status: DC

## 2023-03-24 NOTE — Progress Notes (Signed)
[facility] MD Virtual Progress Note   Patient Location: Home Provider Location: Home Office  I connect with patient by telephone and verified that I am speaking with correct person by using two identifiers. I discussed the limitations of evaluation and management by telemedicine and the availability of in person appointments. I also discussed with the patient that there may be a patient responsible charge related to this service. The patient expressed understanding and agreed to proceed.  Laurie Horton [id number] 77 y.o.  03/24/2023 8:41 AM  History of Present Illness:  Patient is evaluated by phone session.  She reported has been not doing very well due to her general health condition.  She had a lot of back pain and recently seen by pain specialist Dr. Manon Hilding who diagnosed her degenerative disc disease after an MRI.  Patient reported lately having a lot of pain, numbness, tingling and difficult to walk.  Sometimes she feels dizzy but these episodes are not frequently.  She was also given steroid by her pulmonologist and she noticed racing thoughts, poor sleep, irritability and mood depressed.  She did consult her pulmonologist and pharmacy and she was informed about possible side effects of steroids.  She is now off from steroids.  She is feeling somewhat better.  She is still able to walk and drive but had a lot of pain.  She is now on portable oxygen all the time.  Sometimes she has vivid dreams but denies any hallucination, paranoia, suicidal thoughts or homicidal thoughts.  She is very concerned about her general health.  She is not happy with her family members and her children and who she do not feel they care about her.  Patient denies any recent fall.  She is taking trazodone and Pristiq.  She also given hydrocodone but that sometime makes her very sleepy and go.  She used to take the Pristiq 50 mg but we had cut down due to polypharmacy and so far she feels the current dose  is working.  She is hoping she is able to sleep better after she stopped the steroids last night.  She denies any major concern from the medication.  Past Psychiatric History: H/O depression and anxiety. No H/O inpatient treatment, suicidal attempt, paranoia, mania and hallucination. Seeing psychiatrist in 2006 after a stroke.  Tried Cymbalta, lexapro and Celexa with limited response.  Tried Ambien, Lunesta, lexapro, Lamictal and Xanax but developed allergies and side effects.  Tried higher Pristiq but has side effects. Valium helped in past.    Outpatient Encounter Medications as of 03/24/2023  Medication Sig   Alirocumab (PRALUENT) 75 MG/ML SOAJ INJECT 75 MG INTO THE SKIN EVERY 14 (FOURTEEN) DAYS.   carvedilol (COREG) 12.5 MG tablet TAKE 1 TABLET BY MOUTH 2 TIMES DAILY.   clarithromycin (BIAXIN) 500 MG tablet Take 1 tablet (500 mg total) by mouth 2 (two) times daily.   clopidogrel (PLAVIX) 75 MG tablet TAKE 1 TABLET BY MOUTH EVERY DAY   desvenlafaxine (PRISTIQ) 25 MG 24 hr tablet Take 1 tablet (25 mg total) by mouth daily.   diclofenac sodium (VOLTAREN) 1 % GEL Apply 2 g topically 4 (four) times daily.   estradiol (CLIMARA - DOSED IN MG/24 HR) 0.1 mg/24hr patch Place 0.1 mg onto the skin once a week.   estradiol (ESTRACE) 0.1 MG/GM vaginal cream Place 1 Applicatorful vaginally every 14 (fourteen) days.   Eyelid Cleansers (OCUSOFT BABY EYELID & EYELASH EX) Apply topically.   furosemide (LASIX) 40 MG tablet TAKE  1 TABLET BY MOUTH EVERY DAY   gabapentin (NEURONTIN) 100 MG capsule Take 200 mg by mouth See admin instructions. Take 1 in am and 1 in the lunch and 2 at bedtime   HYDROcodone-acetaminophen (NORCO/VICODIN) 5-325 MG tablet Take 1 tablet by mouth in the morning and at bedtime.   ipratropium (ATROVENT) 0.02 % nebulizer solution Take 1.25 mLs (0.25 mg total) by nebulization every 4 (four) hours as needed for wheezing or shortness of breath. [name]   lidocaine (LIDODERM) 5 % Place 1-3  patches onto the skin as directed.   montelukast (SINGULAIR) 10 MG tablet TAKE 1 TABLET BY MOUTH EVERYDAY AT BEDTIME (Patient taking differently: Take 10 mg by mouth daily.)   nitroGLYCERIN (NITROSTAT) 0.4 MG SL tablet PLACE 1 TABLET UNDER THE TONGUE EVERY 5 MINUTES AS NEEDED FOR CHEST PAIN.   ondansetron (ZOFRAN-ODT) 8 MG disintegrating tablet Take 1 tablet (8 mg total) by mouth every 8 (eight) hours as needed for nausea or vomiting.   OXYGEN Inhale 1.5-3 L into the lungs as needed (for shortness of breath).   pantoprazole (PROTONIX) 40 MG tablet TAKE 1 TABLET BY MOUTH EVERY DAY   polyethylene glycol (MIRALAX / GLYCOLAX) packet Take 17 g by mouth daily as needed for mild constipation.    spironolactone (ALDACTONE) 25 MG tablet TAKE 1 TABLET BY MOUTH EVERY DAY   traZODone (DESYREL) 100 MG tablet Take one tab daily as needed for sleep   warfarin (COUMADIN) 5 MG tablet TAKE 1/2 TO 1 TABLET BY MOUTH DAILY OR AS DIRECTED BY CLINIC   No facility-administered encounter medications on file as of 03/24/2023.    Recent Results (from the past 2160 hour(s))  POCT INR     Status: None   Collection Time: 01/04/23  8:32 AM  Result Value Ref Range   INR     POC INR 2.9   INR/PT     Status: Abnormal   Collection Time: 01/11/23 10:37 AM  Result Value Ref Range   INR 7.6 (HH) 0.9 - 1.2    Comment: **Result Repeated** Reference interval is for non-anticoagulated patients. Suggested INR therapeutic range for Vitamin K antagonist therapy:    Standard Dose (moderate intensity                   therapeutic range):       2.0 - 3.0    Higher intensity therapeutic range       2.5 - 3.5    Prothrombin Time 68.9 (H) 9.1 - 12.0 sec  POCT INR     Status: None   Collection Time: 01/14/23  8:18 AM  Result Value Ref Range   INR 2.1 2.0 - 3.0   POC INR    POCT INR     Status: None   Collection Time: 01/25/23  8:33 AM  Result Value Ref Range   INR 2.0 2.0 - 3.0   POC INR    POCT INR     Status: Abnormal    Collection Time: 02/16/23  8:02 AM  Result Value Ref Range   INR 1.7 (A) 2.0 - 3.0   POC INR    POCT INR     Status: Abnormal   Collection Time: 03/10/23  8:16 AM  Result Value Ref Range   INR 1.7 (A) 2.0 - 3.0   POC INR    Brain natriuretic peptide     Status: None   Collection Time: 03/11/23  9:00 AM  Result Value Ref Range  BNP 42.5 0.0 - 100.0 pg/mL    Comment: Siemens ADVIA Centaur XP methodology  Basic metabolic panel     Status: Abnormal   Collection Time: 03/11/23  9:00 AM  Result Value Ref Range   Glucose 104 (H) 70 - 99 mg/dL   BUN 17 8 - 27 mg/dL   Creatinine, Ser 1.61 0.57 - 1.00 mg/dL   eGFR 62 >09 UE/AVW/0.98   BUN/Creatinine Ratio 18 12 - 28   Sodium 139 134 - 144 mmol/L   Potassium 4.3 3.5 - 5.2 mmol/L   Chloride 95 (L) 96 - 106 mmol/L   CO2 30 (H) 20 - 29 mmol/L   Calcium 10.6 (H) 8.7 - 10.3 mg/dL  POCT INR     Status: None   Collection Time: 03/23/23  8:04 AM  Result Value Ref Range   INR 2.5 2.0 - 3.0   POC INR       Psychiatric Specialty Exam: Physical Exam  Review of Systems  Respiratory:         On nasal oxygen  Musculoskeletal:  Positive for back pain.  Neurological:  Positive for numbness.  Psychiatric/Behavioral:  Positive for sleep disturbance. The patient is nervous/anxious.     Weight 183 lb (83 kg).There is no height or weight on file to calculate BMI.  General Appearance: NA  Eye Contact:  NA  Speech:  Slow  Volume:  Decreased  Mood:  Anxious and Dysphoric  Affect:  NA  Thought Process:  Goal Directed  Orientation:  Full (Time, Place, and Person)  Thought Content:  Rumination  Suicidal Thoughts:  No  Homicidal Thoughts:  No  Memory:  Immediate;   Good Recent;   Fair Remote;   Fair  Judgement:  Intact  Insight:  Present  Psychomotor Activity:  NA  Concentration:  Concentration: Fair and Attention Span: Fair  Recall:  Good  Fund of Knowledge:  Good  Language:  Good  Akathisia:  No  Handed:  Right  AIMS (if indicated):      Assets:  Communication Skills Desire for Improvement Housing Resilience Transportation  ADL's:  Intact  Cognition:  WNL  Sleep:  fair     Assessment/Plan: MDD (major depressive disorder), recurrent episode, moderate (HCC) - Plan: traZODone (DESYREL) 100 MG tablet, desvenlafaxine (PRISTIQ) 25 MG 24 hr tablet  Generalized anxiety disorder - Plan: desvenlafaxine (PRISTIQ) 25 MG 24 hr tablet  I reviewed collateral information from other providers, recent blood work results.  Her labs are stable.  She is off from steroids.  Discussed possible side effects of the prednisone which may causing insomnia, more emotional.  I recommend if symptoms do not get better after a few days since she stopped the steroids then she should call us back.  She agreed to keep the current dose of Pristiq and trazodone.  She is also taking pain medicine along with gabapentin and recently Lasix was added.  I review current medication and explained interaction of medication.  Patient not interested in therapy which was offered multiple times.  So far since dose of Pristiq decreased few months ago she did not have as many dizziness and fall.  Patient is also on blood thinner.  Recommend to call us back if she has any question or any concern.  Follow-up in 3 months.   Follow Up Instructions:     I discussed the assessment and treatment plan with the patient. The patient was provided an opportunity to ask questions and all were answered. The patient  agreed with the plan and demonstrated an understanding of the instructions.   The patient was advised to call back or seek an in-person evaluation if the symptoms worsen or if the condition fails to improve as anticipated.    Collaboration of Care: Other provider involved in patient's care AEB notes are available in epic to review.  Patient/Guardian was advised Release of Information must be obtained prior to any record release in order to collaborate their care with an  outside provider. Patient/Guardian was advised if they have not already done so to contact the registration department to sign all necessary forms in order for [place] to release information regarding their care.   Consent: Patient/Guardian gives verbal consent for treatment and assignment of benefits for services provided during this visit. Patient/Guardian expressed understanding and agreed to proceed.     I provided 26 minutes of non face to face time during this encounter.  Note: This document was prepared by [employer] voice dictation technology and any errors that results from this process are unintentional.    Cleotis Nipper, MD 03/24/2023

## 2023-04-01 ENCOUNTER — Other Ambulatory Visit (HOSPITAL_COMMUNITY): Payer: Self-pay | Admitting: Student

## 2023-04-01 DIAGNOSIS — Z95828 Presence of other vascular implants and grafts: Secondary | ICD-10-CM

## 2023-04-01 DIAGNOSIS — I739 Peripheral vascular disease, unspecified: Secondary | ICD-10-CM

## 2023-04-02 ENCOUNTER — Ambulatory Visit (HOSPITAL_BASED_OUTPATIENT_CLINIC_OR_DEPARTMENT_OTHER)
Admission: RE | Admit: 2023-04-02 | Discharge: 2023-04-02 | Disposition: A | Discharge status: Home / Self Care | Payer: Medicare Other | Source: Ambulatory Visit | Attending: Cardiology | Admitting: Cardiology

## 2023-04-02 ENCOUNTER — Ambulatory Visit (HOSPITAL_COMMUNITY)
Admission: RE | Admit: 2023-04-02 | Discharge: 2023-04-02 | Disposition: A | Discharge status: Home / Self Care | Payer: Medicare Other | Source: Ambulatory Visit | Attending: Student | Admitting: Student

## 2023-04-02 DIAGNOSIS — Z95828 Presence of other vascular implants and grafts: Secondary | ICD-10-CM | POA: Insufficient documentation

## 2023-04-02 DIAGNOSIS — I739 Peripheral vascular disease, unspecified: Secondary | ICD-10-CM | POA: Diagnosis present

## 2023-04-02 LAB — VAS US ABI WITH/WO TBI
Left ABI: 1.03
Right ABI: 0.98

## 2023-04-07 ENCOUNTER — Other Ambulatory Visit: Payer: Self-pay | Admitting: Cardiology

## 2023-04-07 DIAGNOSIS — I480 Paroxysmal atrial fibrillation: Secondary | ICD-10-CM

## 2023-04-07 NOTE — Telephone Encounter (Signed)
Refill request for warfarin:  Last INR was 2.5 on 03/23/23 Next INR due 04/20/23 LOV was 03/11/23  Refill approved.

## 2023-04-20 ENCOUNTER — Ambulatory Visit: Payer: Medicare Other | Attending: Cardiovascular Disease

## 2023-04-20 DIAGNOSIS — Z7901 Long term (current) use of anticoagulants: Secondary | ICD-10-CM | POA: Diagnosis not present

## 2023-04-20 DIAGNOSIS — I480 Paroxysmal atrial fibrillation: Secondary | ICD-10-CM | POA: Diagnosis not present

## 2023-04-20 LAB — POCT INR: INR: 3 (ref 2.0–3.0)

## 2023-04-20 NOTE — Patient Instructions (Signed)
Description   Continue taking 1/2 tablet daily except 1 tablet on Mondays, Wednesdays and Fridays. Recheck INR in 4 weeks (Typically 4 weeks) [facility]in Clinic [phone].

## 2023-05-04 ENCOUNTER — Other Ambulatory Visit: Payer: Self-pay | Admitting: Cardiology

## 2023-05-18 ENCOUNTER — Ambulatory Visit: Payer: Medicare Other | Attending: Cardiology

## 2023-05-18 DIAGNOSIS — Z7901 Long term (current) use of anticoagulants: Secondary | ICD-10-CM | POA: Diagnosis not present

## 2023-05-18 DIAGNOSIS — I480 Paroxysmal atrial fibrillation: Secondary | ICD-10-CM

## 2023-05-18 LAB — POCT INR: INR: 2.1 (ref 2.0–3.0)

## 2023-05-18 NOTE — Patient Instructions (Signed)
Continue taking 1/2 tablet daily except 1 tablet on Mondays, Wednesdays and Fridays. Recheck INR in 8 weeks (Typically 4 weeks) [facility] Clinic [phone].

## 2023-05-20 ENCOUNTER — Ambulatory Visit: Payer: Medicare Other | Admitting: Internal Medicine

## 2023-05-20 ENCOUNTER — Encounter: Payer: Self-pay | Admitting: Internal Medicine

## 2023-05-20 VITALS — BP 120/70 | HR 57 | Temp 98.0°F | Ht 65.5 in | Wt 186.6 lb

## 2023-05-20 DIAGNOSIS — Z889 Allergy status to unspecified drugs, medicaments and biological substances status: Secondary | ICD-10-CM

## 2023-05-20 DIAGNOSIS — Z122 Encounter for screening for malignant neoplasm of respiratory organs: Secondary | ICD-10-CM

## 2023-05-20 DIAGNOSIS — J9611 Chronic respiratory failure with hypoxia: Secondary | ICD-10-CM

## 2023-05-20 DIAGNOSIS — J449 Chronic obstructive pulmonary disease, unspecified: Secondary | ICD-10-CM | POA: Diagnosis not present

## 2023-05-20 DIAGNOSIS — J441 Chronic obstructive pulmonary disease with (acute) exacerbation: Secondary | ICD-10-CM | POA: Diagnosis not present

## 2023-05-20 LAB — CBC WITH DIFFERENTIAL/PLATELET
Basophils Absolute: 0 10*3/uL (ref 0.0–0.1)
Basophils Relative: 0.3 % (ref 0.0–3.0)
Eosinophils Absolute: 0.1 10*3/uL (ref 0.0–0.7)
Eosinophils Relative: 2.8 % (ref 0.0–5.0)
HCT: 39.3 % (ref 36.0–46.0)
Hemoglobin: 12.8 g/dL (ref 12.0–15.0)
Lymphocytes Relative: 17.6 % (ref 12.0–46.0)
Lymphs Abs: 0.9 10*3/uL (ref 0.7–4.0)
MCHC: 32.6 g/dL (ref 30.0–36.0)
MCV: 90 fl (ref 78.0–100.0)
Monocytes Absolute: 0.6 10*3/uL (ref 0.1–1.0)
Monocytes Relative: 11.4 % (ref 3.0–12.0)
Neutro Abs: 3.7 10*3/uL (ref 1.4–7.7)
Neutrophils Relative %: 67.9 % (ref 43.0–77.0)
Platelets: 220 10*3/uL (ref 150.0–400.0)
RBC: 4.36 Mil/uL (ref 3.87–5.11)
RDW: 14.9 % (ref 11.5–15.5)
WBC: 5.4 10*3/uL (ref 4.0–10.5)

## 2023-05-20 MED ORDER — CLARITHROMYCIN 500 MG PO TABS: 500.0000 mg | ORAL_TABLET | Freq: Two times a day (BID) | ORAL | 0 refills | Status: DC

## 2023-05-20 MED ORDER — PREDNISONE 10 MG PO TABS: ORAL_TABLET | ORAL | 0 refills | Status: AC

## 2023-05-20 NOTE — Progress Notes (Signed)
Blood eos normla. No role for dupixent. Will NOT be calling in with these result

## 2023-05-20 NOTE — Patient Instructions (Addendum)
ICD-10-CM   1. COPD with acute exacerbation (HCC)  J44.1     2. COPD, frequent exacerbations (HCC)  J44.1     3. Chronic respiratory failure with hypoxia (HCC)  J96.11     4. COPD, severe (HCC)  J44.9     5. Multiple drug allergies  Z88.9     6. Screening for lung cancer  Z12.2      COPD exacerbation  -Slow to resolve plus frequent exacerbations  Plan - Repeat clarithromycin 500 mg twice daily for another 5 days - Please take prednisone 40 mg x1 day, then 30 mg x1 day, then 20 mg x1 day, then 10 mg x1 day, and then 5 mg x1 day and stop  - also like to repeat prednisone if you can tolerate it -Check blood CBC with differential and blood IgE to see if any Biologics can improve frequent COPD exacerbations   Chronic respiratory failure with hypoxia (HCC) Emphysema  -Other than current exacerbation stable disease   Plan - Continue ipratropium 3 mL every 6 hours as needed for shortness of breath or wheezing. Use twice daily for maintenance  - Continue singulair 10 mg At bedtime -  - Continue supplemental oxygen 2-3 lpm with activity and at night. Goal oxygen >88-90%  - Continue Mucinex 600 mg Twice daily for chest congestion/cough - Holding off on antibiotics and prednisone due to side effect concern  Vaccine counseling Multiple drug allergies  -Respect hesitancy towards COVID, RSV and flu vaccine  Plan - Monitor clinically   Screening for lung cancer  -No evidence of pneumonia on CT scan of the chest October 2023.  No lung nodules no lung cancer.  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 Marchelle Gearing or nurse practitioner but after CT scan

## 2023-05-20 NOTE — Progress Notes (Signed)
OV 05/19/2016  Chief Complaint  Patient presents with   Follow-up    Pt c/o worsening SOB, prod cough with thick white mucus.      This is a routine follow-up. Last visit was in April 2017 with nurse practitioner. At that time treated for COPD exacerbation according to her history but review of the chart does not show that to be true. At this point in time she says COPD stable although she says she might be in flare up on account of her fibromyalgia. Starting her symptoms out it appears that it is generally stable with dyspnea at baseline and cough with mild sputum at baseline. She is more hobbled by her chronic pain and fibromyalgia. Couple months ago apparently a hydrocodone was discontinued by rheumatology and therefore she is having "withdrawal". She does have a new pain medication physician. She is on gabapentin for fibromyalgia. There are no other new issues. She does not want antibiotics or prednisone for her perceived exacerbation  OV 11/17/2016  Chief Complaint  Patient presents with   Follow-up    Pt states her SOB has worsened since last OV. Pt states she has a burning in her chest when she becomes SOB. Pt c/o prod cough with white mucus in morning, cough becomes nonprod throughout the day.     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 Last CT chest March 2017 without any mass and associated mild cor pulmonale   Six-month follow-up visit. She is completely overwhelmed by her fibromyalgia and depression. The fibromyalgia is worse. She says she is change doctors because of this. She's had a few admissions in the interim but none of them give a COPD exacerbation according to chart review. She suffered frustrated by her heels oxygen system even though it is light it is causing her pain. She uses Atrovent nebulizer but wants change to something else but at the same time has rejected use of any other  nebulizer or oral inhaler because of side effects. She is burning chest pain with inspiration and associated with her costochondral junction trigger points. 2014 review of the chart shows normal cardiac stress test. November 2017 chest x-ray is clear. She does not want any further imaging. Chest pain is mild to severe and variable. Worsened with inspiration. No radiation associated wheezing. No sputum production  OV 05/25/2017  Chief Complaint  Patient presents with   Follow-up    Pt states her SOB has worsened since last OV in 11/2016. Pt states she only coughs after her neb treatment - pt states her mucus is yellow in color and c/o occ chest discomfort. Pt denies f/c/s.     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 Last CT chest March 2017 without any mass and associated mild cor pulmonale   Follow-up exertional hypoxemia associated with his emphysema. Also associated with fibromyalgia. Last visit she had atypical chest pain. Recommended she see cardiology. 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 According to her history she has significant coronary artery disease but is fairly advanced. She is frustrated with this but realistic. She feels her days are number. In terms of COPD emphysema and is stable. She is on Atrovent inhalers. She is on oxygen. She wants a lighter system. She still burden by fibromyalgia. 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  She uses oxygen and nebulizers.  She is not interested in rehab or vaccines.  New issue: Preoperative pulmonary evaluation.  She is having significant bilateral lower extremity claudication.  She states that she is in severe pain walking from her door to the mailbox to the point she is almost crying.  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  Apparently Dr. Myra Gianotti wants to try a laparotomy approach.  However Dr. Sherry Ruffing might take her to the cardiac Cath Lab and placed stents.  She says she is sensitive to fentanyl and is worried about anesthesia complications but the pain is so severe she is willing to take the risk.  She has had previous cardiac catheterization without any problems other than being sensitive to fentanyl.  She says this can be done in the cardiac Cath Lab with anesthesia support.  She wants me to talk to Dr. Leamon Arnt and Dr. Myra Gianotti about this.   OV 07/18/2018  Subjective:  Patient ID: Laurie Horton, female , DOB: 1946-04-24 , age 77 y.o. , MRN: [id number] , ADDRESS: [address] [place] [address]   07/18/2018 -   Chief Complaint  Patient presents with   Follow-up    Pt states her chest is hurting her all the time now and states she does not think her neb solutions are working for her anymore now. Pt also states she has had some worsening SOB and is also coughing up white phlegm which is comes out in chunks.     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  She uses oxygen and nebulizers. Last CT chest March 2017 without any mass and associated mild cor pulmonale   HPI Laurie Horton 77 y.o. -after last visit she saw  nurse practitioner in June 2019 fora mild respiratory flare. At the time treated with allergy medications antihistamines.She tells me thathe last saw me January 2019 she had an iliac stent place in the left side and after that her effort tolerance is improved but in the last few months she's noticed a decrease in effort tolerance with worsening dyspnea and also increased cough and increased sputum production in volume and also consistency without change in color This no fever or weight loss. She will not have a flu shot because of prior allergy. Her last CT scan of the chest was in 2017 and she is requesting for another one. Her inhaler as ipratropium which she says she's not happy with. In the past she's uses Spiriva and Symbicort and these have caused blisters and so she is generally where you have inhalers although she wants something other than her current one.       08/19/2018  - Visit   [name].  Patient was last seen in our office visit  on 07/18/2018 started on Bevespi.  Patient reports that after 1 day of use of the Bevespi inhaler she developed thrush and mouth sores.  She she contacted our office to be treated with nystatin.  Patient reports that most mouth sores resolved there remains one.  Patient is also having extensive dental work done.  Patient also has been treated for scabies as well as impetigo by dermatology recently.   Patient completed a high-res CT in 07/29/2018 that showed no real changes from baseline.  Still showing severe emphysema.   Patient reports that she continues to use her Atrovent nebulizer but is wondering if there is any other options available for her.  Patient feels that the Atrovent nebulizer is not working as well.  Patient is currently using as needed and not using it scheduled.  MMRC - Breathlessness Score 3 - I stop for breath after walking about 100 yards or after a few minutes on level ground (isle at grocery  store is 157ft)  Patient reports that she has known triggers of shortness of breath with exertion, when there is high pollen counts, when she is walking, when she is outside for extended periods of time.  Patient reports she is been using 2 L via nasal cannula of oxygen with exertion as well as at rest.  Patient reports she forgot her POC at home.  She arrived to our office on room air.  Oxygen saturations 87.  Patient refused oxygen in our office and states that she does not think she needs oxygen right now.  Patient is sad and concerned regarding her limitations with vascular surgery.  Patient believes that she needs a surgery but reports that vascular team as well as cardiology does not think that she would be a good surgical candidate.  She reports that she has not heard back from Dr. Benay Spice office regarding her most recent test results.   Tests:  01/21/2016-CT chest without contrast- moderate centrilobular emphysema and diffuse bronchial wall thickening mild subpleural density in the dependent lower lobes  01/20/2016-pulmonary function test- airway obstruction and diffusion defect suggesting emphysema  Imaging:  02/11/2017-chest x-ray-stable large cardiac silhouette, lungs are hyperinflated, interstitial edema pattern unchanged from prior scans   Cardiac:  07/08/2017-echocardiogram-LV ejection fraction 50 to 55%, grade 1 diastolic dysfunction      OV 12/21/2018  Subjective:  Patient ID: Laurie Horton, female , DOB: 1946/01/29 , age 33 y.o. 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  She uses oxygen and nebulizers. Last CT chest March 2017 without any mass and associated mild cor pulmonale  12/21/2018 -   Chief Complaint  Patient presents with   Follow-up    Pt states due to being switched to a  new medication, she has had labored breathing. SOB is with exertion, has an occ cough with white phlegm, and also has had some occ CP.     HPI Laurie Horton 77 y.o. -  Presents for routine follow-up.  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  COPD CAT score is 25 and she feels stable.  She uses oxygen sporadically and nebulizer sporadically.  She is very afraid of medicines because of multiple allergies.  She says her psychiatrist Dr. Vickie Epley placed her on some medications that caused a rash.  Other than that she is okay.  OV 09/15/2019  Subjective:  Patient ID: Laurie Horton, female , DOB: [birth year] , age 65 y.o. , MRN: [id number] , ADDRESS: [address] 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  She uses oxygen and nebulizers. Last CT chest March 2017 without any mass and associated mild cor pulmonale   09/15/2019 -  No chief complaint on file.    HPI JAYRA CHOYCE 77 y.o. -     ROS - per HPI     OV 01/17/2020  Subjective:  Patient ID: Laurie Horton, female , DOB: [birth year] , age 62 y.o. , MRN: [id number] , ADDRESS: [address] [place] [address]   01/17/2020 -   Chief Complaint  Patient presents with   Follow-up     HPI NAYLAH CORK 77 y.o. -presents for face-to-face follow-up.  In December she called with a COPD flareup symptoms.  She wanted Biaxin despite her allergies.  She feels that Biaxin helps her.  She says she was given prednisone which helped but she was only given generic clarithromycin instead of the tradename Biaxin.  She says it did not work.  She wants tradename Biaxin only.  She says since then her cough is more than baseline.  She also is like sputum that is discolored.  She feels like she is in exacerbation and this is preventing her from getting the COVID-19  vaccine.  She feels another course of tradename Biaxin is required.  She again does not want generic clarithromycin.  She is compliant with her baseline nebulizer nighttime oxygen.  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  She is now recovering from that.  She talked about Covid vaccine.  She wants to get it.  He has had pneumonia vaccine without problem but recently flu shot she feels this put her in the hospital.  She also has multiple oral drug allergies.  Overall she continues to mask and follow social distancing.       CAT COPD Symptom & Quality of Life Score (GSK trademark) 0 is no burden. 5 is highest burden 07/18/2018  12/21/2018  01/17/2020   Never Cough -> Cough all the time 3 3 3   No phlegm in chest -> Chest is full of phlegm 5 3 4   No chest tightness -> Chest feels very tight 4 3 3   No dyspnea for 1 flight stairs/hill -> Very dyspneic for 1 flight of stairs 5 3 5   No limitations for ADL at home -> Very limited with ADL at home 5 4 3   Confident leaving home -> Not at all confident leaving home 0 1 2  Sleep soundly -> Do not sleep soundly because of lung condition 3 4 3   Lots of Energy -> No energy at all 5 4 4   TOTAL Score (max 40)  30 25 27    No flowsheet data found.    12/19/2020 -   Chief Complaint  Patient presents with   Follow-up    Had Covid PNA 10/2020, doing some better     HPI Laurie Horton 77 y.o. -presents for follow-up of her COPD.  She continues to use Atrovent nebulizer.  She prefers nebulizers of inhaler.  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  She uses oxygen at night with exertion.  She tells me that in  December 2021 around Christmas she had respiratory viral symptoms.  She says rapid antigen test by her daughter was negative.  Then in January 2022 she followed up with primary care physician who did a chest x-ray that showed pneumonia.  I have the chest x-ray with me  visualized it.  There is an infiltrate.  She says she was told that she had Covid but there is no confirmatory evidence for this.  She treated herself at home.  She cannot have vaccines because of multiple drug allergies according to history.  I supported her in the decision making.  She is worried about abnormal chest x-ray.        CAT Score 12/19/2020  Total CAT Score 18       OV 01/30/2021  Subjective:  Patient ID: Laurie Horton, female , DOB: 05-15-1946 , age 78 y.o. , MRN: [id number] , ADDRESS: [address] [place] [address] PCP Shade Flood, MD Patient Care Team: Shade Flood, MD as PCP - General (Family Medicine) Runell Gess, MD as PCP - Cardiology (Cardiology) Verdon Cummins, MD as Referring Physician (Physical Medicine and Rehabilitation) Carman Ching, MD (Inactive) as Consulting Physician (Gastroenterology) Micki Riley, MD as Consulting Physician (Neurology) [place], Peter M, MD as Consulting Physician (Cardiology) [name], MD as Consulting Physician (Pulmonary Disease) Lolly Mustache Phillips Grout, MD as Consulting Physician (Psychiatry) Runell Gess, MD as Consulting Physician (Peripheral Vascular Disease)  This Provider for this visit: Treatment Team:  Attending Provider: [name], MD    01/30/2021 -   Chief Complaint  Patient presents with   Follow-up    Doing ok, breathing is the same     HPI Laurie Horton 77 y.o. -presents for follow-up for COPD.  Here to review the results.  Her daughter Brigetta Beckstrom is on the phone.  No new interim complaints.  A Covid IgG is negative thus making her recent viral infection is unlikely as Covid.  I offered a referral for Covid monoclonal antibody prophylaxis but she declined.  Her pulmonary function test shows 12% decline in FEV1 in 5 years.  I reviewed the chart and do not find an alpha-1 check.  I presume this was done prior to [place] using electronic medical records.  She  is open to getting it tested again.  She is to go since 2 COPD.  She tells me that the DME company is out of supply with tubing for her nebulizer.  She has used an inhaler but she is somewhat skeptical because of nasal septal perforation.  Explained to her inhalers oral.  She then talked about dentures.  Explained that we can do inhalers through an [facility].  She had high-resolution CT chest.  This shows emphysema.  There is evidence of cor pulmonale.  There is stable thoracic aorta aneurysm.  No fibrosis no cancer.   CAT Score 01/30/2021 12/19/2020  Total CAT Score 10 18        Ref Range & Units 1 mo ago  SARS COV1 AB(IGG)SPIKE,SEMI QN <1.00 index <1.00         IMPRESSION: 1. No evidence of interstitial lung disease. Air trapping is indicative of small airways disease. 2. Ascending aortic aneurysm, stable. Recommend annual imaging followup by CTA or MRA. This recommendation follows 2010 ACCF/AHA/AATS/ACR/ASA/SCA/SCAI/SIR/STS/SVM Guidelines for the Diagnosis and Management of Patients with Thoracic Aortic Disease. Circulation. 2010; 121: W102-V253. Aortic aneurysm NOS (ICD10-I71.9). 3. Aortic atherosclerosis (ICD10-I70.0). Coronary artery calcification. 4. Enlarged pulmonic trunk, indicative of  pulmonary arterial hypertension. 5.  Emphysema (ICD10-J43.9).     Electronically Signed   By: Leanna Battles M.D.   On: 01/15/2021 11:52     OV 07/22/2021  Subjective:  Patient ID: Laurie Horton, female , DOB: May 29, 1946 , age 15 y.o. , MRN: [id number] , ADDRESS: [address] [place] [address] PCP Shade Flood, MD Patient Care Team: Shade Flood, MD as PCP - General (Family Medicine) [place], Peter M, MD as PCP - Cardiology (Cardiology) Verdon Cummins, MD as Referring Physician (Physical Medicine and Rehabilitation) Carman Ching, MD (Inactive) as Consulting Physician (Gastroenterology) Micki Riley, MD as Consulting Physician  (Neurology) [place], Peter M, MD as Consulting Physician (Cardiology) [name], MD as Consulting Physician (Pulmonary Disease) Lolly Mustache Phillips Grout, MD as Consulting Physician (Psychiatry)  This Provider for this visit: Treatment Team:  Attending Provider: [name], MD    07/22/2021 -   Chief Complaint  Patient presents with   Follow-up    Pt states she had pneumonia since last visit. States she went to see PCP after finishing meds and was told she still had infection and was placed back on abx by PCP.   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  She uses oxygen and nebulizer  HPI Laurie Horton 77 y.o. -returns for follow-up.  On 06/18/2021 she called with COPD exacerbation symptoms.  Gave her Biaxin and prednisone.  But she tells me that because the line was busy our office did not call in the medications till 5 days later when they got hold of her.  She says she is only partially improved and she is her primary care physician.  She had chest x-ray 07/16/2021.  This reports of interstitial prominence.  However there is no mass or consolidation.  She had basic labs that I reviewed and it is normal.  Apparently primary care physician gave her another Biaxin.  But there is no prednisone.  She still has yellow symptoms.  Symptoms only partially resolved.  She feels another round of prednisone will help her.  Of note we put her on triple inhaler BREZTRI last visit but this gave her thrush.  She is taking ipratropium.  If she only takes it 3 times a day when she has tremors that she is managing just with 2 times a day and this is insufficient.  She is using oxygen continuous now up to 2-1/2 L.  She is now moved downstairs because of her shortness of breath.  She is wanting oxygen concentrator upstairs and downstairs.  In the last few to several years she is refused flu shot.   She will not have the COVID-vaccine either.      OV 08/21/2021  Subjective:  Patient ID: Laurie Horton, female , DOB: 1946-09-23 , age 5 y.o. , MRN: [id number] , ADDRESS: [address] [place] [address] PCP Shade Flood, MD Patient Care Team: Shade Flood, MD as PCP - General (Family Medicine) [place], Peter M, MD as PCP - Cardiology (Cardiology) Verdon Cummins, MD as Referring Physician (Physical Medicine and Rehabilitation) Carman Ching, MD (Inactive) as Consulting Physician (Gastroenterology) Micki Riley, MD as Consulting Physician (Neurology) [place], Peter M, MD as Consulting Physician (Cardiology) [name], MD as Consulting Physician (Pulmonary Disease) Lolly Mustache Phillips Grout, MD as Consulting Physician (Psychiatry)  This Provider for this visit: Treatment Team:  Attending Provider: [name], MD  Follow-up  emphysema with chronic hypoxemic restorative failure and associated fibromyalgia and associated chronic systolic heart failure: Overall doing well.  She uses oxygen and nebulizer  08/21/2021 -   Chief Complaint  Patient presents with   Follow-up    Pt was at the ED 10/17 after PCP sent her there. States that she has been using Yupelri neb sol. States that it does make her feel dizzy but has helped with her breathing better than prior neb sol.     HPI Laurie Horton 77 y.o. -returns for follow-up to have a follow-up chest x-ray because of his concerns of interstitial edema versus atypical pneumonia at last visit.  I look to the follow-up chest x-ray.  Personal visualization shows it is unchanged.  I think this is a baseline based on the fact his CT scan of the chest in March 2022 look very similar.  She is feeling stable.  Also last visit we put her on Yupelri nebulizer.  Part of this follow-up was to see how she is doing with that.  She says it makes her dizzy but then after a while she is able to tolerate it.  So she is careful  and some days she does not use it.  But overall she feels its more beneficial than ipratropium.  Therefore she wants to stick with it.  Of note on 08/18/2021 she did have a video visit with her psychiatrist for depression.  She also has had a skin biopsy in her left thigh and apparently this was infected and she is sent to the ED for IV antibiotics but there was 11-hour wait and she left AMA without being seen.  She is very upset about her experience.  She is reflecting on it and is wondering whether she should go to the medical board and file a formal complaint.  But overall from a respiratory standpoint she is stable.    The other issues that she wants to oxygen concentrator's.  1 5 stairs and 1 for downstairs.  Last visit I thought I put an order in but she says at that never got it.  She feels service at [facility] is terrible.  I informed her that we will do the order again.          OV 11/13/2021  Subjective:  Patient ID: Laurie Horton, female , DOB: Sep 25, 1946 , age 57 y.o. , MRN: [id number] , ADDRESS: [address] [place] [address] PCP Georgianne Fick, MD Patient Care Team: Georgianne Fick, MD as PCP - General (Internal Medicine) [place], Peter M, MD as PCP - Cardiology (Cardiology) Verdon Cummins, MD as Referring Physician (Physical Medicine and Rehabilitation) Carman Ching, MD (Inactive) as Consulting Physician (Gastroenterology) Micki Riley, MD as Consulting Physician (Neurology) [place], Peter M, MD as Consulting Physician (Cardiology) [name], MD as Consulting Physician (Pulmonary Disease) Lolly Mustache Phillips Grout, MD as Consulting Physician (Psychiatry)  This Provider for this visit: Treatment Team:  Attending Provider: [name], MD    11/13/2021 -   Chief Complaint  Patient presents with   Follow-up    Pt states her breathing has become a little worse since last visit. States she has had a virus for the past 10 days.    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  She uses oxygen and nebulizer  HPI Laurie Horton 77 y.o. -returns for follow-up.  Is a 60-month routine follow-up.  She tells  me that some of her neighbors got sick with the flu.  She had flulike symptoms which she is flu negative.  She was in bed for a week.  She felt it was mild.  She did not call us.  She is back to baseline now.  Overall she is stable.  She tells me that her biggest issue is the portable oxygen.  She says Medicare refused to give her 2 systems 1 5 stairs and 1 for downstairs.  Currently she has a Lawyer inside the house that she plugs and uses it at night.  For exertion she uses a Helio system.  She feels that he does system using 100 pound.  Oxygen gallon tank that is outside on the patio.  She wants to bring this inside and then the DME company will run her house for upstairs and downstairs.  She says when she goes into the system the DME company says she cannot revert back to her concentrator.  She is a little bit nervous about this approach with the biggest hold-up is that the DME company is asking for credit card information and she is worried about identity theft.  And price gouging   OV 05/22/2022  Subjective:  Patient ID: Laurie Horton, female , DOB: [birth year] , age 78 y.o. , MRN: [id number] , ADDRESS: [address] [place] [address] PCP Georgianne Fick, MD Patient Care Team: Georgianne Fick, MD as PCP - General (Internal Medicine) [place], Peter M, MD as PCP - Cardiology (Cardiology) Verdon Cummins, MD as Referring Physician (Physical Medicine and Rehabilitation) Carman Ching, MD (Inactive) as Consulting Physician (Gastroenterology) Micki Riley, MD as Consulting Physician (Neurology) [place], Peter M, MD as Consulting Physician (Cardiology) [name], MD as Consulting Physician (Pulmonary Disease) Lolly Mustache Phillips Grout, MD as Consulting Physician (Psychiatry)  This Provider for this visit: Treatment Team:  Attending Provider: [name], MD  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  She uses oxygen and nebulizer . Last CT chest MArch 2022  05/22/2022 -   Chief Complaint  Patient presents with   Follow-up    Follow-up SOB, Coughing, wheezing     HPI Laurie Horton 77 y.o. -returns for follow-up.  This is a routine follow-up but she tells me that since early June 2023 she has been sick.  Apparently she finished grocery shopping and then came home had chills diarrhea and cough shortly after that with a severe cough she had hemoptysis that started and resolved a week later but she was in bed for 21 days.  She was increased use of nebulizer.  She believes it was a respiratory virus.  Apparently repeated COVID test was negative.  She was so ill that her daughters who live an hour and a half however dropping off food for her.  She lives alone.  Currently she is better but still having wheezing.  She is still using nebulizer twice daily.  She is also got postnasal drip and she is needing Benadryl but the hemoptysis resolved.  She is willing to take prednisone.  She continues oxygen and her regular nebulizer and Singulair.  Last CT scan of the chest was over a year ago.  She is willing to get this repeated.   05/22/2022: OV with Dr. Marchelle Gearing. Intended to be routine follow up but she had been having sick symptoms since June 2023. She had gone grocery shopping and came  home with chills, diarrhea, and cough. She also developed hemoptysis, which resolved after a week. Stated she was in bed for 21 days. Doing better at OV but still wheezing. She is also having postnasal drip. 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  Continue supplemental O2. Continue nebulized medications. Continued singulair. Plan for CT chest in 2-4 weeks.   06/09/2022: OV with Cobb NP for follow-up after being treated for slow to resolve AECOPD related to respiratory virus.  She completed prednisone course and had scheduled CT in the interim.  CT scan showed a right middle lobe airspace disease, concerning for pneumonia.  She has not had any antibiotics up to this point.  Today, she reports still having some increased shortness of breath from her baseline.  She did feel better when she was on the prednisone.  She also has a productive cough with yellow to green sputum and chest congestion.  She has not had any further episodes of hemoptysis.  She does have an occasional wheeze, which improves with her nebulizer treatments.  She denies any fevers, night sweats, orthopnea, leg swelling, URI symptoms, interim sick exposures.  She is using her DuoNeb 3 times a day.  She states that she is unable to tolerate any and all inhalers as they cause irritation to the back of her throat and mouth.  She takes Singulair at bedtime.  She continues on her supplemental oxygen with activity and at night.  Usually uses 2-3 lpm.  Viral respiratory infection in June with persistent AECOPD. Treated with prednisone taper in July. CT chest 7/28 revealed a new RML airspace consolidation, concerning for pna given her infectious symptoms. We will treat her with augmentin 7 day course. Advised to use mucinex to assist with mucociliary clearance. Repeat CT chest 6 weeks after completion of abx.  06/23/2022: Today - follow up Patient presents today for follow up after being treated for AECOPD and pneumonia. She reports that she has been feeling much better since she completed her abx and prednisone. Breathing is back to baseline and her cough is clearing up, usually with very pale yellow or clear sputum. She does still feel more fatigued than usually but feels like she's slowly  building her stamina back up. Denies any fevers, chills, night sweats, hemoptysis, chest congestion, orthopnea, leg swelling. She continues to use her ipratropium neb for maintenance 3 times a day. Takes singulair at bedtime. Maintaining oxygen on 2-3 lpm.    OV 08/27/2022  Subjective:  Patient ID: Laurie Horton, female , DOB: June 09, 1946 , age 55 y.o. , MRN: [id number] , ADDRESS: [address] [place] [address] PCP Georgianne Fick, MD Patient Care Team: Georgianne Fick, MD as PCP - General (Internal Medicine) [place], Peter M, MD as PCP - Cardiology (Cardiology) Verdon Cummins, MD as Referring Physician (Physical Medicine and Rehabilitation) Carman Ching, MD (Inactive) as Consulting Physician (Gastroenterology) Micki Riley, MD as Consulting Physician (Neurology) [place], Peter M, MD as Consulting Physician (Cardiology) [name], MD as Consulting Physician (Pulmonary Disease) Lolly Mustache Phillips Grout, MD as Consulting Physician (Psychiatry)  This Provider for this visit: Treatment Team:  Attending Provider: [name], MD  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  She uses oxygen and nebulizer . Last CT chest MArch 2022  08/27/2022 -   Chief Complaint  Patient presents with   Follow-up    Follow-up visit PT here to discuss results of CT  scan Wants walk test to qualify for inogen      HPI Laurie Horton 77 y.o. -returns for follow-up.  She states that she is now been approved by her [place] [employer] for In[employer] oxygen system because the regular oxygen system is heavy.  She needs a qualifying walk otherwise shortness of breath is at baseline.  She had CT scan of the chest without contrast.  The right middle lobe pneumonia is resolved.  No pulmonary fibrosis no lung cancer no lung nodule no pneumonia anymore.  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  She continues her ipratropium and Singulair.  She is refused all vaccines because of multiple drug allergies.  Otherwise no new issues  Social: Her granddaughter age 80 has new diagnosis of breast cancer and she is upset about it.   CT Chest data 08/18/22  Narrative & Impression  CLINICAL DATA:  Pneumonia, complication suspected, xray done   Community acquired pneumonia of right middle lobe.   EXAM: CT CHEST WITHOUT CONTRAST   TECHNIQUE: Multidetector CT imaging of the chest was performed following the standard protocol without IV contrast.   RADIATION DOSE REDUCTION: This exam was performed according to the departmental dose-optimization program which includes automated exposure control, adjustment of the mA and/or kV according to patient size and/or use of iterative reconstruction technique.   COMPARISON:  CT 05/29/2022   FINDINGS: Cardiovascular: Fusiform aneurysmal dilatation of the ascending aorta maximal dimension 4.1 cm, previously 4 cm. The thoracic aorta is densely calcified and tortuous. The origin of the left subclavian artery is densely calcified. Dilated main pulmonary artery at 3.5 cm. Upper normal heart size with dense coronary artery calcifications. No pericardial effusion.   Mediastinum/Nodes: Scattered small nonenlarged mediastinal lymph nodes, stable from prior exam and likely reactive. No suspicious mediastinal adenopathy. Assessment for hilar adenopathy is limited on this unenhanced exam. Unremarkable appearance of the esophagus.   Lungs/Pleura: The previous right middle lobe airspace disease has resolved. No evidence of underlying mass. Moderate to advanced emphysema. Minor dependent atelectasis in the right lower lobe. No suspicious pulmonary nodule or mass. There is retained mucus within the right mainstem and lower lobe bronchus. No pleural fluid.   Upper Abdomen: No acute findings.   Musculoskeletal: Mild scoliosis with  diffuse degenerative change in the thoracic spine. There are no acute or suspicious osseous abnormalities. No chest wall soft tissue abnormalities.   IMPRESSION: 1. Previous right middle lobe airspace disease has resolved. No residual or evidence of underlying mass. 2. 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 Recommend annual imaging followup by CTA or MRA. This recommendation follows 2010 ACCF/AHA/AATS/ACR/ASA/SCA/SCAI/SIR/STS/SVM Guidelines for the Diagnosis and Management of Patients with Thoracic Aortic Disease. Circulation. 2010; 121: V784-O962. Aortic aneurysm NOS (ICD10-I71.9) 3. Aortic atherosclerosis and coronary artery calcifications. 4. Dilated main pulmonary artery suggesting pulmonary arterial hypertension. 5. Moderate to advanced emphysema   Aortic Atherosclerosis (ICD10-I70.0) and Emphysema (ICD10-J43.9).     Electronically Signed   By: Narda Rutherford M.D.   On: 08/19/2022 20:37      OV 01/15/2023  Subjective:  Patient ID: Laurie Horton, female , DOB: 21-Jul-1946 , age 35 y.o. , MRN: [id number] , ADDRESS: [address] [place] [address] PCP Georgianne Fick, MD Patient Care Team: Georgianne Fick, MD as PCP - General (Internal Medicine) [place], Peter M, MD as PCP - Cardiology (Cardiology) Verdon Cummins, MD as Referring Physician (Physical Medicine and Rehabilitation) Carman Ching, MD (Inactive)  as Consulting Physician (Gastroenterology) Micki Riley, MD as Consulting Physician (Neurology) [place], Peter M, MD as Consulting Physician (Cardiology) [name], MD as Consulting Physician (Pulmonary Disease) Lolly Mustache Phillips Grout, MD as Consulting Physician (Psychiatry)  This Provider for this visit: Treatment Team:  Attending Provider: [name], MD    01/15/2023 -   Chief Complaint  Patient presents with   Follow-up    Patient saw PCP.  Treated for yeast  infection.  Rx Flagyl and  Diflucan.  INR level at 7.6.  PCP d/c'd meds.  Diet a lot of greens.  Patient improving.  C/o wheeze and cough with yellow phlem.  Some weakness.   Fu emphysema  HPI Laurie Horton 76 y.o. -  presents for routine followup. Last few weeks was having yellow sputum and cough with this. DOes  not want antibioptics or prenisone due to side effect and yeast infection ris, Dyspnea is stable though at 1-2L [place] at rest. Does chair exrcises but reduced to every other day to give recovery time. REcently with flagyl and diflucan INR went up. She cannot afford eliquis she says.So tried to enroll in a clinical tril with Dr Pearlean Brownie but opted against it0due to AE risk concern      OV 05/20/2023  Subjective:  Patient ID: Laurie Horton, female , DOB: Jun 06, 1946 , age 43 y.o. , MRN: [id number] , ADDRESS: [address] [place] [address] PCP Georgianne Fick, MD Patient Care Team: Georgianne Fick, MD as PCP - General (Internal Medicine) [place], Peter M, MD as PCP - Cardiology (Cardiology) Verdon Cummins, MD as Referring Physician (Physical Medicine and Rehabilitation) Carman Ching, MD (Inactive) as Consulting Physician (Gastroenterology) Micki Riley, MD as Consulting Physician (Neurology) [place], Peter M, MD as Consulting Physician (Cardiology) [name], MD as Consulting Physician (Pulmonary Disease) Lolly Mustache Phillips Grout, MD as Consulting Physician (Psychiatry)  This Provider for this visit: Treatment Team:  Attending Provider: [name], MD    05/20/2023 -   Chief Complaint  Patient presents with   Follow-up    Coughing up yellow.  Recently diagnosed with scoliosis.     HPI Laurie Horton 77 y.o. -presents for follow-up.  She tells me that she feels she has ongoing exacerbation.  Sputum is still yellow.  In May 2024 we called in some clarithromycin and prednisone but she felt the course was too short.  She wants another round  of antibiotics for sure.  Will also offer her some prednisone if she wants she can take it.  Reviewed labs of previous eosinophils normal.  She does take Praluent.  I discussed the concept of biologic for COPD exacerbation prevention.  She is open to this idea and is willing to get CBC with differential checked.  Otherwise she is due for CT scan low-dose in the fall 2024 for lung cancer screening.    No other issues other than the fact in the interim for the last month she has had left-sided breast pain she has been given some increased Lasix and she has an MRI pending with her primary care doctor.    Simple office walk 185 feet x  3 laps goal with forehead probe 08/27/2022    O2 used ra   Number laps completed 1 of 3 laps   Comments about pace x   Resting Pulse Ox/HR 98% and 83/min   Final Pulse Ox/HR 99% and 98/min   Desaturated </= 88% yes   Desaturated <= 3% points yes  Got Tachycardic >/= 90/min yes   Symptoms at end of test Mod-severe dyspnea   Miscellaneous comments Needing 2L [place] to correct and do 1 lap      LAB RESULTS last 96 hours No results found.  LAB RESULTS last 90 days   Latest Reference Range & Units 05/28/09 11:45 04/09/10 08:50 07/10/11 19:24 09/27/12 11:27 05/04/13 10:40 03/29/14 09:32 12/06/14 09:15 07/11/15 08:28 08/03/16 16:40 08/18/16 08:43 09/01/16 11:58 02/11/17 01:46 08/29/18 06:31 11/22/19 18:07 08/18/21 12:26 12/05/21 21:32  Eosinophils Absolute 0.0 - 0.5 K/uL 0.1 0.1 0.2 0.2 0.1 0.1 0.1 0.1 0.0 67 0.1 0.1 0.1 0.1 0.3 0.1     Recent Results (from the past 2160 hour(s))  POCT INR     Status: Abnormal   Collection Time: 03/10/23  8:16 AM  Result Value Ref Range   INR 1.7 (A) 2.0 - 3.0   POC INR    Brain natriuretic peptide     Status: None   Collection Time: 03/11/23  9:00 AM  Result Value Ref Range   BNP 42.5 0.0 - 100.0 pg/mL    Comment: Siemens ADVIA Centaur XP methodology  Basic metabolic panel     Status: Abnormal   Collection Time: 03/11/23   9:00 AM  Result Value Ref Range   Glucose 104 (H) 70 - 99 mg/dL   BUN 17 8 - 27 mg/dL   Creatinine, Ser 6.57 0.57 - 1.00 mg/dL   eGFR 62 >84 ON/GEX/5.28   BUN/Creatinine Ratio 18 12 - 28   Sodium 139 134 - 144 mmol/L   Potassium 4.3 3.5 - 5.2 mmol/L   Chloride 95 (L) 96 - 106 mmol/L   CO2 30 (H) 20 - 29 mmol/L   Calcium 10.6 (H) 8.7 - 10.3 mg/dL  POCT INR     Status: None   Collection Time: 03/23/23  8:04 AM  Result Value Ref Range   INR 2.5 2.0 - 3.0   POC INR    VAS [place] ABI WITH/WO TBI     Status: None   Collection Time: 04/02/23 10:03 AM  Result Value Ref Range   Right ABI .98    Left ABI 1.03   POCT INR     Status: None   Collection Time: 04/20/23  7:46 AM  Result Value Ref Range   INR 3.0 2.0 - 3.0   POC INR    POCT INR     Status: None   Collection Time: 05/18/23  7:37 AM  Result Value Ref Range   INR 2.1 2.0 - 3.0   POC INR       PFT     Latest Ref Rng & Units 01/30/2021    8:51 AM 01/20/2016    9:35 AM 08/14/2013   10:17 AM  PFT Results  FVC-Pre L 2.61  2.79    FVC-Predicted Pre % 92  93  100   FVC-Post L   3.07   FVC-Predicted Post %   103   Pre FEV1/FVC % % 63  67  62   Post FEV1/FCV % %   64   FEV1-Pre L 1.63  1.86  1.84   FEV1-Predicted Pre % 77  82  82   FEV1-Post L   1.97   DLCO uncorrected ml/min/mmHg 9.45  9.75  11.57   DLCO UNC% % 49  40  50   DLCO corrected ml/min/mmHg 9.45     DLCO COR %Predicted % 49     DLVA Predicted % 57  51  58   TLC L 4.59  4.62  4.32   TLC % Predicted % 90  91  88   RV % Predicted % 80  78  77        has a past medical history of Acute diastolic heart failure, NYHA class 1 (HCC) (02/08/2017), Acute heart failure (HCC) (02/08/2017), Cataract, CHF (congestive heart failure) (HCC) (03/02/2017), Complication of anesthesia, COPD (chronic obstructive pulmonary disease) (HCC), Depression, Depression, Diverticulitis, DVT (deep venous thrombosis) (HCC), Fibromyalgia, GERD (gastroesophageal reflux disease), Heart murmur, Hiatal  hernia, History of deviated nasal septum, HTN (hypertension), Hyperlipidemia, Hypertension, Hypertensive retinopathy, Myocardial infarction (HCC), Obesity, On supplemental oxygen therapy, Osteoporosis, Osteoporosis, Oxygen deficiency, PAT (paroxysmal atrial tachycardia), PFO (patent foramen ovale), PULMONARY NODULE, LEFT LOWER LOBE (10/14/2009), 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   reports that she quit smoking about 12 years ago. Her smoking use included cigarettes. She started smoking about 42 years ago. She has a 90 pack-year smoking history. She has never used smokeless tobacco.  Past Surgical History:  Procedure Laterality Date   ABDOMINAL HYSTERECTOMY N/A    Phreesia 11/04/2020   CARDIOVASCULAR STRESS TEST  12/26/2004   EF 74%. NO EVIDENCE OF ISCHEMIA   CATARACT EXTRACTION Left    Dr. Hortense Ramal   ESOPHAGOGASTRODUODENOSCOPY (EGD) WITH PROPOFOL N/A 04/15/2015   Procedure: ESOPHAGOGASTRODUODENOSCOPY (EGD) WITH PROPOFOL;  Surgeon: Carman Ching, MD;  Location: [facility];  Service: Endoscopy;  Laterality: N/A;   EYE SURGERY Left    Cat Sx   JOINT REPLACEMENT N/A    Phreesia 11/04/2020   KNEE ARTHROSCOPY  2000   left   LAPAROSCOPIC CHOLECYSTECTOMY  04-16-2010   [facility]   LOWER EXTREMITY ANGIOGRAPHY N/A 09/09/2017   Procedure: Lower Extremity Angiography;  Surgeon: Runell Gess, MD;  Location: [facility] INVASIVE CV LAB;  Service: Cardiovascular;  Laterality: N/A;   LOWER EXTREMITY INTERVENTION Left 01/17/2018   Procedure: LOWER EXTREMITY INTERVENTION;  Surgeon: Runell Gess, MD;  Location: [facility] INVASIVE CV LAB;  Service: Cardiovascular;  Laterality: Left;   MOUTH SURGERY     03-26-15 multiple extractions stitches remains   PERIPHERAL VASCULAR INTERVENTION Left 01/17/2018   Procedure: PERIPHERAL VASCULAR INTERVENTION;  Surgeon: Runell Gess, MD;  Location: [facility] INVASIVE CV  LAB;  Service: Cardiovascular;  Laterality: Left;  COMMON ILIAC   RIGHT/LEFT HEART CATH AND CORONARY ANGIOGRAPHY N/A 03/04/2017   Procedure: Right/Left Heart Cath and Coronary Angiography;  Surgeon: Peter M [place], MD;  Location: [facility] INVAS[facility] CV LAB;  Service: Cardiovascular;  Laterality: N/A;   TOTAL ABDOMINAL HYSTERECTOMY     post op needed oxygen was told "she gave them a scare"   TUBAL LIGATION     US ECHOCARDIOGRAPHY  11/20/2009   EF 55-60%    Allergies  Allergen Reactions   Alprazolam Anaphylaxis and Other (See Comments)    REACTION: stops breathing   Bee Venom Anaphylaxis   Iodine Anaphylaxis, Swelling and Other (See Comments)    REACTION: swelling in throat   Pseudoephedrine Hcl Er Shortness Of Breath   Budesonide-Formoterol Fumarate Other (See Comments)    Blisters inside of mouth all over   Crestor [Rosuvastatin Calcium] Other (See Comments)    Unable to walk   Esomeprazole Magnesium Other (See Comments)    REACTION: "bouncing off walls"   Flonase [Fluticasone Propionate] Other (See Comments)    NOSE BLEED   Lamictal [Lamotrigine] Rash    Patient got rash, labored breathing,  and diarrhea   Loratadine Other (See Comments)    claritin D causes shaking   Lotrimin [Clotrimazole] Other (See Comments)    Mouth blisters   Lunesta [Eszopiclone] Other (See Comments)    REACTION: "slept for a week"   Oxcarbazepine Other (See Comments)    Causes deep sleep and dizziness   Statins Other (See Comments)    Can't walk, legs won't work    Zolpidem Tartrate Other (See Comments)    REACTION: "slept for a week"   Betadine [Povidone Iodine] Other (See Comments)    Breathing problems   Bevespi Aerosphere [Glycopyrrolate-Formoterol] Other (See Comments)    Pt believes this caused mouth sores and thrush    Breztri Aerosphere [Budeson-Glycopyrrol-Formoterol] Other (See Comments)    Thrush   Clarithromycin Other (See Comments)    All "mycins", Puts into "a" fib, Will take if has to for  severe sinus infection   Effexor [Venlafaxine] Nausea And Vomiting and Other (See Comments)    cramps   Lexapro [Escitalopram Oxalate] Other (See Comments)    hallucinations   Aciphex [Rabeprazole Sodium] Rash   Alendronate Sodium Other (See Comments)    "caused stomach problems for 3 days"   Avelox [Moxifloxacin Hcl In Nacl] Other (See Comments)    Stomach cramps.    Bextra [Valdecoxib] Rash   Ceclor [Cefaclor] Rash   Cephalexin Rash and Other (See Comments)    Pt states that she is possibly allergic to this - had a reaction to Cefaclor in the past and she does not want to these class drugs. Added per patient request.   Covera-Hs [Verapamil Hcl] Palpitations   Dicyclomine Hcl Rash   Estradiol Other (See Comments)    Breast soreness (severe).   Other Other (See Comments)    Glue from ekg/heart monitor leads --rash, Any MYCINS   Tessalon Perles Rash    Immunization History  Administered Date(s) Administered   Influenza Split 08/03/2011, 08/01/2012   Influenza,inj,Quad PF,6+ Mos 08/14/2013, 09/03/2014, 09/04/2015, 08/18/2016   Influenza-Unspecified 11/03/1999   Pneumococcal Conjugate-13 10/01/2014   Pneumococcal Polysaccharide-23 09/03/1999, 06/02/2006, 08/14/2013   Td 11/02/1994   Tdap 08/18/2016    Family History  Problem Relation Age of Onset   Dementia Mother    Diabetes Mother    Alzheimer's disease Mother    Heart attack Brother 34   Heart attack Father    Schizophrenia Sister    Diabetes Sister    Tremor Sister      Current Outpatient Medications:    Alirocumab (PRALUENT) 75 MG/ML SOAJ, INJECT 75 MG INTO THE SKIN EVERY 14 (FOURTEEN) DAYS., Disp: 2 mL, Rfl: 11   carvedilol (COREG) 12.5 MG tablet, TAKE 1 TABLET BY MOUTH 2 TIMES DAILY., Disp: 60 tablet, Rfl: 11   clopidogrel (PLAVIX) 75 MG tablet, TAKE 1 TABLET BY MOUTH EVERY DAY, Disp: 30 tablet, Rfl: 11   desvenlafaxine (PRISTIQ) 25 MG 24 hr tablet, Take 1 tablet (25 mg total) by mouth daily., Disp: 30 tablet,  Rfl: 2   diclofenac sodium (VOLTAREN) 1 % GEL, Apply 2 g topically 4 (four) times daily., Disp: , Rfl: 5   Eyelid Cleansers (OCUSOFT BABY EYELID & EYELASH EX), Apply topically., Disp: , Rfl:    furosemide (LASIX) 40 MG tablet, TAKE 1 TABLET BY MOUTH EVERY DAY, Disp: 30 tablet, Rfl: 11   gabapentin (NEURONTIN) 100 MG capsule, Take 200 mg by mouth See admin instructions. Take 1 in am and 1 in the lunch and 2 at bedtime, Disp: , Rfl:  HYDROcodone-acetaminophen (NORCO/VICODIN) 5-325 MG tablet, Take 1 tablet by mouth in the morning and at bedtime., Disp: , Rfl:    ipratropium (ATROVENT) 0.02 % nebulizer solution, Take 1.25 mLs (0.25 mg total) by nebulization every 4 (four) hours as needed for wheezing or shortness of breath. [name], Disp: 62.5 mL, Rfl: 5   lidocaine (LIDODERM) 5 %, Place 1-3 patches onto the skin as directed., Disp: , Rfl:    montelukast (SINGULAIR) 10 MG tablet, TAKE 1 TABLET BY MOUTH EVERYDAY AT BEDTIME (Patient taking differently: Take 10 mg by mouth daily.), Disp: 90 tablet, Rfl: 2   nitroGLYCERIN (NITROSTAT) 0.4 MG SL tablet, PLACE 1 TABLET UNDER THE TONGUE EVERY 5 MINUTES AS NEEDED FOR CHEST PAIN., Disp: 25 tablet, Rfl: 3   ondansetron (ZOFRAN-ODT) 8 MG disintegrating tablet, Take 1 tablet (8 mg total) by mouth every 8 (eight) hours as needed for nausea or vomiting., Disp: 20 tablet, Rfl: 0   OXYGEN, Inhale 1.5-3 L into the lungs as needed (for shortness of breath)., Disp: , Rfl:    pantoprazole (PROTONIX) 40 MG tablet, TAKE 1 TABLET BY MOUTH EVERY DAY, Disp: 30 tablet, Rfl: 5   polyethylene glycol (MIRALAX / GLYCOLAX) packet, Take 17 g by mouth daily as needed for mild constipation. , Disp: , Rfl:    spironolactone (ALDACTONE) 25 MG tablet, TAKE 1 TABLET BY MOUTH EVERY DAY, Disp: 30 tablet, Rfl: 3   traZODone (DESYREL) 100 MG tablet, Take one tab daily as needed for sleep, Disp: 30 tablet, Rfl: 2   warfarin (COUMADIN) 5 MG tablet, TAKE ONE-HALF TO 1 TABLET BY MOUTH DAILY  OR AS DIRECTED BY CLINIC, Disp: 30 tablet, Rfl: 3      Objective:   Vitals:   05/20/23 0906  BP: 120/70  Pulse: (!) 57  Temp: 98 F (36.7 C)  TempSrc: Oral  SpO2: 97%  Weight: 186 lb 9.6 oz (84.6 kg)  Height: 5' 5.5" (1.664 m)    Estimated body mass index is 30.58 kg/m as calculated from the following:   Height as of this encounter: 5' 5.5" (1.664 m).   Weight as of this encounter: 186 lb 9.6 oz (84.6 kg).  @WEIGHTCHANGE @  [employer]   05/20/23 0906  Weight: 186 lb 9.6 oz (84.6 kg)     Physical Exam   General: No distress. Looks same O2 at rest: YES Cane present: no Sitting in wheel chair: no Frail: no Obese: no Neuro: Alert and Oriented x 3. GCS 15. Speech normal Psych: Pleasant Resp:  Barrel Chest - no.  Wheeze - no, Crackles - no, No overt respiratory distress CVS: Normal heart sounds. Murmurs - no Ext: Stigmata of Connective Tissue Disease - no HEENT: Normal upper airway. PEERL +. No post nasal drip        Assessment:       ICD-10-CM   1. COPD with acute exacerbation (HCC)  J44.1     2. COPD, frequent exacerbations (HCC)  J44.1     3. Chronic respiratory failure with hypoxia (HCC)  J96.11     4. COPD, severe (HCC)  J44.9     5. Multiple drug allergies  Z88.9     6. Screening for lung cancer  Z12.2          Plan:     Patient Instructions     ICD-10-CM   1. COPD with acute exacerbation (HCC)  J44.1     2. COPD, frequent exacerbations (HCC)  J44.1     3. Chronic  respiratory failure with hypoxia (HCC)  J96.11     4. COPD, severe (HCC)  J44.9     5. Multiple drug allergies  Z88.9     6. Screening for lung cancer  Z12.2      COPD exacerbation  -Slow to resolve plus frequent exacerbations  Plan - Repeat clarithromycin 500 mg twice daily for another 5 days - Please take prednisone 40 mg x1 day, then 30 mg x1 day, then 20 mg x1 day, then 10 mg x1 day, and then 5 mg x1 day and stop  - also like to repeat prednisone if you can  tolerate it -Check blood CBC with differential and blood IgE to see if any Biologics can improve frequent COPD exacerbations   Chronic respiratory failure with hypoxia (HCC) Emphysema  -Other than current exacerbation stable disease   Plan - Continue ipratropium 3 mL every 6 hours as needed for shortness of breath or wheezing. Use twice daily for maintenance  - Continue singulair 10 mg At bedtime -  - Continue supplemental oxygen 2-3 lpm with activity and at night. Goal oxygen >88-90%  - Continue Mucinex 600 mg Twice daily for chest congestion/cough - Holding off on antibiotics and prednisone due to side effect concern  Vaccine counseling Multiple drug allergies  -Respect hesitancy towards COVID, RSV and flu vaccine  Plan - Monitor clinically   Screening for lung cancer  -No evidence of pneumonia on CT scan of the chest October 2023.  No lung nodules no lung cancer.  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 Marchelle Gearing or nurse practitioner but after CT scan   FOLLOWUP Return in about 14 weeks (around 08/26/2023) for 15 min visit, with Dr Marchelle Gearing, Face to Face Visit.    SIGNATURE    Dr. [name], M.D., F.C.C.P,  Pulmonary and Critical Care Medicine Staff Physician, [facility] Center Director - Interstitial Lung Disease  Program  Pulmonary Fibrosis [facility] at [facility] [place], [place], [address]  Pager: [phone], If no answer or between  15:00h - 7:00h: call [phone] Telephone: [phone]  9:32 AM 05/20/2023   Moderate Complexity MDM OFFICE  2021 E/M guidelines, first released in 2021, with minor revisions added in 2023 and 2024 Must meet the requirements for 2 out of 3 dimensions to qualify.    Number and complexity of problems addressed Amount and/or complexity of data  reviewed Risk of complications and/or morbidity  One or more chronic illness with mild exacerbation, OR progression, OR  side effects of treatment  Two or more stable chronic illnesses  One undiagnosed new problem with uncertain prognosis  One acute illness with systemic symptoms   One Acute complicated injury Must meet the requirements for 1 of 3 of the categories)  Category 1: Tests and documents, historian  Any combination of 3 of the following:  Assessment requiring an independent historian  Review of prior external note(s) from each unique source  Review of results of each unique test  Ordering of each unique test    Category 2: Interpretation of tests   Independent interpretation of a test performed by another physician/other qualified health care professional (not separately reported)  Category 3: Discuss management/tests  Discussion of management or test interpretation with external physician/other qualified health care professional/appropriate source (  not separately reported) Moderate risk of morbidity from additional diagnostic testing or treatment Examples only:  Prescription drug management  Decision regarding minor surgery with identfied patient or procedure risk factors  Decision regarding elective major surgery without identified patient or procedure risk factors  Diagnosis or treatment significantly limited by social determinants of health             HIGh Complexity  OFFICE   2021 E/M guidelines, first released in 2021, with minor revisions added in 2023. Must meet the requirements for 2 out of 3 dimensions to qualify.    Number and complexity of problems addressed Amount and/or complexity of data reviewed Risk of complications and/or morbidity  Severe exacerbation of chronic illness  Acute or chronic illnesses that may pose a threat to life or bodily function, e.g., multiple trauma, acute MI, pulmonary embolus, severe respiratory distress,  progressive rheumatoid arthritis, psychiatric illness with potential threat to self or others, peritonitis, acute renal failure, abrupt change in neurological status Must meet the requirements for 2 of 3 of the categories)  Category 1: Tests and documents, historian  Any combination of 3 of the following:  Assessment requiring an independent historian  Review of prior external note(s) from each unique source  Review of results of each unique test  Ordering of each unique test    Category 2: Interpretation of tests    Independent interpretation of a test performed by another physician/other qualified health care professional (not separately reported)  Category 3: Discuss management/tests  Discussion of management or test interpretation with external physician/other qualified health care professional/appropriate source (not separately reported)  HIGH risk of morbidity from additional diagnostic testing or treatment Examples only:  Drug therapy requiring intensive monitoring for toxicity  Decision for elective major surgery with identified pateint or procedure risk factors  Decision regarding hospitalization or escalation of level of care  Decision for DNR or to de-escalate care   Parenteral controlled  substances            LEGEND - Independent interpretation involves the interpretation of a test for which there is a CPT code, and an interpretation or report is customary. When a review and interpretation of a test is performed and documented by the provider, but not separately reported (billed), then this would represent an independent interpretation. This report does not need to conform to the usual standards of a complete report of the test. This does not include interpretation of tests that do not have formal reports such as a complete blood count with differential and blood cultures. Examples would include reviewing a chest radiograph and documenting in the medical  record an interpretation, but not separately reporting (billing) the interpretation of the chest radiograph.   An appropriate source includes professionals who are not health care professionals but may be involved in the management of the patient, such as a Clinical research associate, upper officer, case manager or teacher, and does not include discussion with family or informal caregivers.    - SDOH: SDOH are the conditions in the environments where people are born, live, learn, work, play, worship, and age that affect a wide range of health, functioning, and quality-of-life outcomes and risks. (e.g., housing, food insecurity, transportation, etc.). SDOH-related Z codes ranging from Z55-Z65 are the ICD-10-CM diagnosis codes used to document SDOH data Z55 - Problems related to education and literacy Z56 - Problems related to employment and unemployment Z57 - Occupational exposure to risk factors Z58 - Problems related to physical environment Z59 - Problems  related to housing and economic circumstances [phone] - Problems related to social environment [phone] - Problems related to upbringing [phone] - Other problems related to primary support group, including family circumstances Z26 - Problems related to certain psychosocial circumstances Z65 - Problems related to other psychosocial circumstances

## 2023-05-20 NOTE — Addendum Note (Signed)
Addended by: Delrae Rend on: 05/20/2023 09:37 AM   Modules accepted: Orders

## 2023-05-21 LAB — IGE: IgE (Immunoglobulin E), Serum: 11 kU/L (ref <=114)

## 2023-06-24 ENCOUNTER — Other Ambulatory Visit: Payer: Self-pay | Admitting: Cardiology

## 2023-06-24 DIAGNOSIS — I2510 Atherosclerotic heart disease of native coronary artery without angina pectoris: Secondary | ICD-10-CM

## 2023-06-24 DIAGNOSIS — E7800 Pure hypercholesterolemia, unspecified: Secondary | ICD-10-CM

## 2023-06-25 ENCOUNTER — Telehealth (HOSPITAL_BASED_OUTPATIENT_CLINIC_OR_DEPARTMENT_OTHER): Payer: Medicare Other | Admitting: Psychiatry

## 2023-06-25 ENCOUNTER — Encounter (HOSPITAL_COMMUNITY): Payer: Self-pay | Admitting: Psychiatry

## 2023-06-25 VITALS — Wt 186.0 lb

## 2023-06-25 DIAGNOSIS — F331 Major depressive disorder, recurrent, moderate: Secondary | ICD-10-CM

## 2023-06-25 DIAGNOSIS — F411 Generalized anxiety disorder: Secondary | ICD-10-CM

## 2023-06-25 MED ORDER — DESVENLAFAXINE SUCCINATE ER 25 MG PO TB24
25.0000 mg | ORAL_TABLET | Freq: Every day | ORAL | 2 refills | Status: DC
Start: 2023-06-25 — End: 2023-09-23

## 2023-06-25 MED ORDER — TRAZODONE HCL 100 MG PO TABS: ORAL_TABLET | ORAL | 2 refills | Status: DC
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
## 2023-06-25 NOTE — Addendum Note (Signed)
Addended by: Kathryne Sharper T on: 06/25/2023 09:59 AM   Modules accepted: Orders, Level of Service

## 2023-06-30 ENCOUNTER — Other Ambulatory Visit: Payer: Self-pay | Admitting: Cardiology

## 2023-07-08 NOTE — Progress Notes (Signed)
Cardiology Office Note    Date:  07/14/2023   ID:  [name], [name] [birth year], MRN [id number]  PCP:  Georgianne Fick, MD  Cardiologist: Dr. [place]   Chief Complaint  Patient presents with   Coronary Artery Disease     History of Present Illness:    Laurie Horton is a 77 y.o. female with past medical history of chronic diastolic CHF, PFO, PAF (on Coumadin), COPD (on 2L [place] at baseline), HTN, and prior CVA who is seen for follow up CHF and PAD.  She was admitted from 4/9 - 02/12/2017 for worsening dyspnea on exertion and palpitations. Was in atrial fibrillation with RVR upon arrival to the ED. Echo during admission showed a newly reduced EF of 20-25% and she was diuresed with IV Lasix. Enzymes were negative and EKG showed no acute ischemic changes. It was recommended to consider a right/left heart cath in 4-6 weeks.   She did undergo right and left heart cath on 03/04/17. This showed severe 2 vessel obstructive CAD with 100% RCA occlusion, 75% OM1, and 90% small OM2. EF 25-30%. Mild pulmonary HTN with normal LV filling pressures. 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 Maximizing CHF therapy recommended.  She did have repeat Echo in September 2018 showing improvement in EF to 50-55%.   Subsequent to this she developed significant claudication. She was seen by Dr. Allyson Sabal and had angiography showing 80% infrarenal aortic stenosis and left iliac stenosis. She also had severe right common femoral stenosis. She was seen by Dr Myra Gianotti for consideration of Aortobifemoral bypass. After pulmonary evaluation she was felt to be too high a risk for open surgery. In March 2019 she underwent atherectomy and covered stenting of the left iliac by Dr. Allyson Sabal. On follow up she did have improvement in her claudication and ABIs. The aortic and right common femoral artery stenoses are not felt to be amenable to percutaneous therapy.   In early July 2019 she was seen because she felt she was in Afib  following dental procedure. On arrival she was in NSR with PACs. No medical changes made.   She has been followed by Dr Allyson Sabal. She was noted to have a high grade left subclavian stenosis but it was unclear that this was symptomatic and given all her medical problems was felt best to manage medically. She had left shoulder injection by Dr Cleophas Dunker for adhesive capsulitis. She notes this has helped with her pain significantly. She is followed by pulmonary for COPD.   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 INR had been therapeutic. At time of infarct it was 1.9. We decided to add plavix 75 mg daily due to her extensive vascular disease. MRI showed no acute infarct but she did have evidence of multiple old strokes.   She had PNA in Dec/Jan. Followed by Dr Colletta Maryland for COPD exacerbation. Had CT done. Aortic size at 4.1 cm.    Patient presented to the hospital on 12/06/2021 with palpitations, chest pain, she was found to be in A-fib with RVR.  Serial troponin went from 9 --> 24 --> 36.  While in the ED, patient converted to sinus rhythm spontaneously. She was seen by the cardiology fellow who felt that she was not in CHF. Echocardiogram obtained on the same day showed EF 55 to 60%, no regional wall motion abnormality, mild LVH, mild LAE. She was discharged to follow-up with cardiology service as outpatient.  CT of the head obtained on 12/08/2021 was negative for acute intracranial abnormality.  Heart monitor obtained in February showed several episodes of A-fib and SVT, however A-fib burden was fairly low at 2%.  She was seen in May by Marjie Skiff PA-C. Noted weight gain. Lasix was increased for a few days. LE dopplers repeated and looked good with no change.   On follow up today she still notes issues with her allergies and breathing. Followed by pulmonary. No chest pain. No edema. BP is well controlled. Finds that Praluent is too expensive  since she is in the donut hole. Still has Afib about twice a month lasting 1-4 hours.  Past Medical History:  Diagnosis Date   Acute diastolic heart failure, NYHA class 1 (HCC) 02/08/2017   Acute heart failure (HCC) 02/08/2017   Cataract    OD   CHF (congestive heart failure) (HCC) 03/02/2017   EF 25-30% 2018   Complication of anesthesia    various issues with oxygen  saturations post op   COPD (chronic obstructive pulmonary disease) (HCC)    Depression    Depression    Phreesia 11/04/2020   Diverticulitis    DVT (deep venous thrombosis) (HCC)    Fibromyalgia    GERD (gastroesophageal reflux disease)    Heart murmur    Phreesia 11/04/2020   Hiatal hernia    History of deviated nasal septum    left- side   HTN (hypertension)    Hyperlipidemia    Hypertension    Phreesia 11/04/2020   Hypertensive retinopathy    OU   Myocardial infarction (HCC)    Phreesia 11/04/2020   Obesity    On supplemental oxygen therapy    concentrator at night @ 1.5 l/m or when sleeps. O2 Sat niormally 87.   Osteoporosis    Osteoporosis    Phreesia 11/04/2020   Oxygen deficiency    Phreesia 11/04/2020   PAT (paroxysmal atrial tachycardia)    PFO (patent foramen ovale)    PULMONARY NODULE, LEFT LOWER LOBE 10/14/2009   5mm LLL nodule dec 2010. Stable and 4mm in Oct 2012. 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 Persists on cxr 07/22/11. Cleared on CT 08/24/11. No further followup   Stroke [facility])    TOBACCO ABUSE 06/04/2009    Past Surgical History:  Procedure Laterality Date   ABDOMINAL HYSTERECTOMY N/A    Phreesia 11/04/2020   CARDIOVASCULAR STRESS TEST  12/26/2004   EF 74%. NO EVIDENCE OF ISCHEMIA   CATARACT EXTRACTION Left    Dr. Hortense Ramal   ESOPHAGOGASTRODUODENOSCOPY (EGD) WITH PROPOFOL N/A 04/15/2015   Procedure: ESOPHAGOGASTRODUODENOSCOPY (EGD) WITH PROPOFOL;  Surgeon: Carman Ching, MD;  Location: [facility] ENDOSCOPY;  Service: Endoscopy;  Laterality: N/A;   EYE SURGERY Left    Cat Sx   JOINT REPLACEMENT N/A    Phreesia 11/04/2020   KNEE ARTHROSCOPY  2000   left   LAPAROSCOPIC CHOLECYSTECTOMY  04-16-2010   [place]   LOWER EXTREMITY ANGIOGRAPHY N/A 09/09/2017   Procedure: Lower Extremity Angiography;  Surgeon: Runell Gess, MD;  Location: [facility] INVASIVE CV LAB;  Service: Cardiovascular;  Laterality: N/A;   LOWER EXTREMITY INTERVENTION Left 01/17/2018   Procedure: LOWER EXTREMITY INTERVENTION;  Surgeon: Runell Gess, MD;  Location: [facility] INVASIVE CV LAB;  Service: Cardiovascular;  Laterality: Left;   MOUTH SURGERY     03-26-15 multiple extractions stitches remains  PERIPHERAL VASCULAR INTERVENTION Left 01/17/2018   Procedure: PERIPHERAL VASCULAR INTERVENTION;  Surgeon: Runell Gess, MD;  Location: [facility] INVASIVE CV LAB;  Service: Cardiovascular;  Laterality: Left;  COMMON ILIAC   RIGHT/LEFT HEART CATH AND CORONARY ANGIOGRAPHY N/A 03/04/2017   Procedure: Right/Left Heart Cath and Coronary Angiography;  Surgeon: Renella Steig M Swazil[place], MD;  Location: [facility] INVASIVE CV LAB;  Service: Cardiovascular;  Laterality: N/A;   TOTAL ABDOMINAL HYSTERECTOMY     post op needed oxygen was told "she gave them a scare"   TUBAL LIGATION     US ECHOCARDIOGRAPHY  11/20/2009   EF 55-60%    Current Medications: Outpatient Medications Prior to Visit  Medication Sig Dispense Refill   Alirocumab (PRALUENT) 75 MG/ML SOAJ INJECT 75 MG SUBCUTANEOUSLY EVERY 14 DAYS 2 mL 9   carvedilol (COREG) 12.5 MG tablet TAKE 1 TABLET BY MOUTH 2 TIMES DAILY. 60 tablet 11   clopidogrel (PLAVIX) 75 MG tablet TAKE 1 TABLET BY MOUTH EVERY DAY 30 tablet 11   desvenlafaxine (PRISTIQ) 25 MG 24 hr tablet Take 1 tablet (25 mg total) by mouth daily. 30 tablet 2   diclofenac sodium (VOLTAREN) 1 % GEL Apply 2 g topically 4 (four) times daily.  5   Eyelid Cleansers (OCUSOFT BABY EYELID & EYELASH EX) Apply topically.      furosemide (LASIX) 40 MG tablet TAKE 1 TABLET BY MOUTH EVERY DAY 30 tablet 11   gabapentin (NEURONTIN) 100 MG capsule Take 200 mg by mouth See admin instructions. Take 1 in am and 1 in the lunch and 2 at bedtime     HYDROcodone-acetaminophen (NORCO/VICODIN) 5-325 MG tablet Take 1 tablet by mouth in the morning and at bedtime.     ipratropium (ATROVENT) 0.02 % nebulizer solution Take 1.25 mLs (0.25 mg total) by nebulization every 4 (four) hours as needed for wheezing or shortness of breath. [name] 62.5 mL 5   lidocaine (LIDODERM) 5 % Place 1-3 patches onto the skin as directed.     montelukast (SINGULAIR) 10 MG tablet TAKE 1 TABLET BY MOUTH EVERYDAY AT BEDTIME (Patient taking differently: Take 10 mg by mouth daily.) 90 tablet 2   nitroGLYCERIN (NITROSTAT) 0.4 MG SL tablet PLACE 1 TABLET UNDER THE TONGUE EVERY 5 MINUTES AS NEEDED FOR CHEST PAIN. 25 tablet 3   OXYGEN Inhale 1.5-3 L into the lungs as needed (for shortness of breath).     pantoprazole (PROTONIX) 40 MG tablet TAKE 1 TABLET BY MOUTH EVERY DAY 30 tablet 5   polyethylene glycol (MIRALAX / GLYCOLAX) packet Take 17 g by mouth daily as needed for mild constipation.      spironolactone (ALDACTONE) 25 MG tablet TAKE 1 TABLET BY MOUTH EVERY DAY 90 tablet 2   traZODone (DESYREL) 100 MG tablet Take one tab daily as needed for sleep 30 tablet 2   warfarin (COUMADIN) 5 MG tablet TAKE ONE-HALF TO 1 TABLET BY MOUTH DAILY OR AS DIRECTED BY CLINIC 30 tablet 3   clarithromycin (BIAXIN) 500 MG tablet Take 1 tablet (500 mg total) by mouth 2 (two) times daily. 10 tablet 0   ondansetron (ZOFRAN-ODT) 8 MG disintegrating tablet Take 1 tablet (8 mg total) by mouth every 8 (eight) hours as needed for nausea or vomiting. 20 tablet 0   No facility-administered medications prior to visit.     Allergies:   Alprazolam, Bee venom, Iodine, Pseudoephedrine hcl er, Budesonide-formoterol fumarate, Crestor [rosuvastatin calcium], Esomeprazole magnesium, Flonase  [fluticasone propionate], Lamictal [lamotrigine], Loratadine, Lotrimin [clotrimazole], Lunesta [eszopiclone], Oxcarbazepine,  Statins, Zolpidem tartrate, Betadine [povidone iodine], Bevespi aerosphere [glycopyrrolate-formoterol], Breztri aerosphere [budeson-glycopyrrol-formoterol], Clarithromycin, Effexor [venlafaxine], Lexapro [escitalopram oxalate], Aciphex [rabeprazole sodium], Alendronate sodium, Avelox [moxifloxacin hcl in nacl], Bextra [valdecoxib], Ceclor [cefaclor], Cephalexin, Covera-hs [verapamil hcl], Dicyclomine hcl, Estradiol, Other, and Tessalon perles   Social History   Socioeconomic History   Marital status: Divorced    Spouse name: Not on file   Number of children: 2   Years of education: Not on file   Highest education level: Not on file  Occupational History   Occupation: disability  Tobacco Use   Smoking status: Former    Current packs/day: 0.00    Average packs/day: 3.0 packs/day for 30.0 years (90.0 ttl pk-yrs)    Types: Cigarettes    Start date: 06/02/1980    Quit date: 06/02/2010    Years since quitting: 13.1   Smokeless tobacco: Never   Tobacco comments:    QUIT IN 2011  Vaping Use   Vaping status: Never Used  Substance and Sexual Activity   Alcohol use: No    Alcohol/week: 0.0 standard drinks of alcohol   Drug use: No   Sexual activity: Yes    Partners: Male    Birth control/protection: Post-menopausal, Surgical  Other Topics Concern   Not on file  Social History Narrative   Marital status: divorced in 1978 after ten years; dating casually in 2019.      Children: 3 biological children; 3 court appointed children; 6 grandchildren; 5 gg      Lives: alone; [place]      Employment: retired age 73; disability for COPD, CVA at age 47.      Tobacco: former smoker; quit smoking 2011.      Alcohol: none      Exercise: walks dog four times per day; goes to pool three times per week.      ADLs: drives; no assistant devices; does have a  walker.  Cleaning is limited in 2018.  Daughter helps with cleaning.  Does own grocery shopping.      Advanced Directives: YES; DNR/DNI; HCPOA: [name] Martin/daughter youngest.  Blind.               Social Determinants of Health   Financial Resource Strain: Not on file  Food Insecurity: Not on file  Transportation Needs: Not on file  Physical Activity: Not on file  Stress: Not on file  Social Connections: Not on file     Family History:  The patient's family history includes Alzheimer's disease in her mother; Dementia in her mother; Diabetes in her mother and sister; Heart attack in her father; Heart attack (age of onset: 74) in her brother; Schizophrenia in her sister; Tremor in her sister.   Review of Systems:   As noted in HPI.  All other systems reviewed and are otherwise negative except as noted above.   Physical Exam:    VS:  BP 124/78   Pulse 77   Ht 5' 5.5" (1.664 m)   Wt 182 lb 6.4 oz (82.7 kg)   SpO2 92%   BMI 29.89 kg/m    GENERAL:  Well appearing overweight WF in NAD HEENT:  PERRL, EOMI, sclera are clear. Oropharynx is clear. NECK:  No jugular venous distention, carotid upstroke brisk and symmetric, right carotid bruit,  left subclavian  bruit, no thyromegaly or adenopathy LUNGS:  Clear to auscultation bilaterally CHEST:  + scoliosis.  HEART:  RRR,  PMI not displaced or sustained,S1 and S2 within  normal limits, no S3, no S4: no clicks, no rubs, no murmurs.  ABD:  Soft, nontender. BS +, no masses or bruits. No hepatomegaly, no splenomegaly EXT:  Poor pedal pulses, absent left radial pulse.  no edema, no cyanosis no clubbing SKIN:  Warm and dry.  No rashes NEURO:  Alert and oriented x 3. Cranial nerves II through XII intact. PSYCH:  Cognitively intact    Wt Readings from Last 3 Encounters:  07/14/23 182 lb 6.4 oz (82.7 kg)  05/20/23 186 lb 9.6 oz (84.6 kg)  03/11/23 183 lb (83 kg)     Studies/Labs Reviewed:   Recent Labs: 03/11/2023: BNP 42.5;  BUN 17; Creatinine, Ser 0.94; Potassium 4.3; Sodium 139 05/20/2023: Hemoglobin 12.8; Platelets 220.0   Lipid Panel    Component Value Date/Time   CHOL 132 07/02/2021 1017   CHOL 115 09/17/2020 0946   TRIG 220.0 (H) 07/02/2021 1017   HDL 41.80 07/02/2021 1017   HDL 36 (L) 09/17/2020 0946   CHOLHDL 3 07/02/2021 1017   VLDL 44.0 (H) 07/02/2021 1017   LDLCALC 52 09/17/2020 0946   LDLDIRECT 67.0 07/02/2021 1017  Dated 11/20/21: cholesterol 142, HDL 42,  Dated 03/23/22:  A1c 6.3%. triglycerides122   Additional studies/ records that were reviewed today include:   Echocardiogram: 02/09/2017 Study Conclusions   - Left ventricle: The cavity size was mildly dilated. Wall   thickness was increased in a pattern of mild LVH. Systolic   function was severely reduced. The estimated ejection fraction   was in the range of 20% to 25%. Diffuse hypokinesis. Doppler   parameters are consistent with abnormal left ventricular   relaxation (grade 1 diastolic dysfunction). - Aortic valve: Valve area (Vmax): 1.4 cm^2. - Aortic root: The aortic root was mildly dilated. - Ascending aorta: The ascending aorta was mildly dilated. - Mitral valve: Calcified annulus. There was moderate   regurgitation. - Left atrium: The atrium was moderately dilated. - Right ventricle: Systolic function was moderately reduced. - Pulmonary arteries: Systolic pressure was mildly increased. - Pericardium, extracardiac: A trivial pericardial effusion was   identified.   Impressions:   - No subcostal views; severe global reduction in LV systolic   function; grade 1 diastolic dysfunction; mildly dilated aortic   root and ascending aorta; moderate MR; moderaet LAE; moderately   reduced RV function; mild TR; mildly elevated pulmonary pressure.  Procedures   Right/Left Heart Cath and Coronary Angiography  Conclusion     Ost 1st Mrg to 1st Mrg lesion, 75 %stenosed. 2nd Mrg lesion, 90 %stenosed. Ost RCA to Mid RCA lesion,  100 %stenosed. There is severe left ventricular systolic dysfunction. LV end diastolic pressure is normal. The left ventricular ejection fraction is 25-35% by visual estimate. Hemodynamic findings consistent with mild pulmonary hypertension. LV end diastolic pressure is normal.   1. Severe 2 vessel obstructive CAD    - 75% proximal OM1    - 90% small OM2    - 100% proximal RCA. Left to right collaterals.  2. Severe LV dysfunction 3. Mild pulmonary HTN with normal LV filling pressures.  4. Cardiac index 2.41 L/min/BSA     Plan: Medical management to try and optimize CHF therapy. Patient appears to be adequately diuresed at this time. Based on these results her cardiomyopathy is ischemic. I would treat her CAD medically. If cardiac cath is needed in the future would consider alternative access given difficulty from the right radial approach. Her rhythm during procedure is a multifocal atrial rhythm/tachycardia.  Echo 07/08/17: Study Conclusions   - Left ventricle: The cavity size was normal. There was moderate   concentric hypertrophy. Systolic function was normal. The   estimated ejection fraction was in the range of 50% to 55%.   Severe hypokinesis of the basal-midinferior myocardium;   consistent with infarction in the distribution of the right   coronary artery. Doppler parameters are consistent with abnormal   left ventricular relaxation (grade 1 diastolic dysfunction). - Mitral valve: Calcified annulus.   Impressions:   - Compared to April 2018 there is marked improvement in contraction   of all LV wall segemnts except for the inferior wall, which   remains severely hypokinetic. There is also marked reduction in   the severity of mitral insufficiency.    Echo 12/06/2021  1. Left ventricular ejection fraction, by estimation, is 55 to 60%. The  left ventricle has normal function. The left ventricle has no regional  wall motion abnormalities. There is mild left ventricular  hypertrophy.  Left ventricular diastolic parameters  were normal.   2. Right ventricular systolic function is normal. The right ventricular  size is normal.   3. Left atrial size was mildly dilated.   4. The mitral valve is normal in structure. No evidence of mitral valve  regurgitation. No evidence of mitral stenosis.   5. The aortic valve is tricuspid. There is mild calcification of the  aortic valve. There is mild thickening of the aortic valve. Aortic valve  regurgitation is not visualized. Aortic valve sclerosis is present, with  no evidence of aortic valve stenosis.   6. The inferior vena cava is normal in size with greater than 50%  respiratory variability, suggesting right atrial pressure of 3 mmHg.     Monitor 01/02/22: Study Highlights    Normal sinus rhythm Atrial fibrillation with RVR on 12/19/21 and 12/21/21 with longest episode lasting 5 hours and 40 minutes and average HR 120 bpm. AFib burden 2% Frequent PACs with few short bursts of SVT longest lasting 15 beats Occasional PVCs with rare NSVT longest lasting 5 beats. Symptoms appear to correlate with Afib an SVT.     Patch Wear Time:  13 days and 23 hours (2023-02-10T10:12:14-0500 to 2023-02-24T09:21:34-0500)   Patient had a min HR of 43 bpm, max HR of 235 bpm, and avg HR of 71 bpm. Predominant underlying rhythm was Sinus Rhythm. First Degree AV Block was present. 2 Ventricular Tachycardia runs occurred, the run with the fastest interval lasting 5 beats with a  max rate of 235 bpm (avg 156 bpm); the run with the fastest interval was also the longest. 7 Supraventricular Tachycardia runs occurred, the run with the fastest interval lasting 4 beats with a max rate of 176 bpm, the longest lasting 15 beats with an  avg rate of 128 bpm. Some episodes of Supraventricular Tachycardia may be possible Atrial Tachycardia with variable block. Atrial Fibrillation occurred (2% burden), ranging from 75-167 bpm (avg of 120 bpm), the longest  lasting 5 hours 40 mins with an avg  rate of 116 bpm. Supraventricular Tachycardia and Atrial Fibrillation were detected within +/- 45 seconds of symptomatic patient event(s). Isolated SVEs were frequent (11.5%, [id number]), SVE Couplets were frequent (5.5%, [id number]), and SVE Triplets were  occasional (1.7%, 8129). Isolated VEs were occasional (4.5%, [id number]), VE Couplets were rare (<1.0%, 596), and VE Triplets were rare (<1.0%, 41). Ventricular Bigeminy and Trigeminy were present.  Assessment:    1. PAF (paroxysmal atrial fibrillation) (HCC)   2. Chronic systolic CHF (  congestive heart failure) (HCC)   3. PAD (peripheral artery disease) (HCC)   4. Essential hypertension          Plan:    1. Chronic Combined Systolic and Diastolic CHF/  Secondary to ischemic/tachycardia mediated Cardiomyopathy - EF 25-30% in 2018 with ischemic cardiomyopathy. Not a candidate for revascularization. With medical management EF improved to 50-55% by Echo. Last done in Feb 2023 - she is euvolemic at this time. Weight is stable.   - continue BB, aldactone, lasix 40 mg daily and statin. ARB discontinued due to orthostatic dizziness.   2. Paroxysmal Atrial Fibrillation/ multifocal atrial tachycardia - This patients CHA2DS2-VASc Score and unadjusted Ischemic Stroke Rate (% per year) is equal to 9.7 % stroke rate/year from a score of 6 (CHF, HTN, Female, Age, CVA (2)). She denies any evidence of active bleeding. Continue Coumadin for anticoagulation. On Coreg for rate control. On event monitor burden was low 2%.   3. HTN- controlled.   4. COPD/Emphysema- - followed by Pulmonology. On oxygen  5. PFO - no plans for closure   6. PAD s/p covered stenting of left iliac. Stenosis in distal aorta and right femoral not amenable to percutaneous therapy. Left subclavian stenosis. Claudication is stable.   7. CAD. 100% RCA. 75% OM. Chronic stable angina class 1-2.  No active angina. Rarely uses Ntg. Current pain is clearly  musculoskeletal on exam  8. S/p right retinal artery occlusion. Plavix added to coumadin due to severe vascular disease. I feel benefit of Plavix/coumadin combination outweighs risk of bleeding at this time.  9. Old CVAs noted on MRI  10. Hypercholesterolemia. Now on Praluent. Will check with pharmacy to see if we can help with cost - otherwise may have to wait until new year.   11. Carotid arterial disease. No new Neurologic symptoms. Dopplers in January 2023 were unchanged.   12. 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 Reassured her about this. She is not a candidate for aortic surgery  13. Musculoskeletal rib pain.   I will follow up in 6 months   Signed, Syenna Nazir [place], MD  07/14/2023 9:19 AM    [facility] [address] [place], [place] [address] Phone: [phone]

## 2023-07-14 ENCOUNTER — Ambulatory Visit: Payer: Medicare Other | Attending: Cardiology | Admitting: Cardiology

## 2023-07-14 ENCOUNTER — Encounter: Payer: Self-pay | Admitting: Cardiology

## 2023-07-14 ENCOUNTER — Ambulatory Visit (INDEPENDENT_AMBULATORY_CARE_PROVIDER_SITE_OTHER): Payer: Medicare Other | Admitting: *Deleted

## 2023-07-14 VITALS — BP 124/78 | HR 77 | Ht 65.5 in | Wt 182.4 lb

## 2023-07-14 DIAGNOSIS — Z7901 Long term (current) use of anticoagulants: Secondary | ICD-10-CM | POA: Diagnosis not present

## 2023-07-14 DIAGNOSIS — I480 Paroxysmal atrial fibrillation: Secondary | ICD-10-CM

## 2023-07-14 DIAGNOSIS — I739 Peripheral vascular disease, unspecified: Secondary | ICD-10-CM

## 2023-07-14 DIAGNOSIS — I5022 Chronic systolic (congestive) heart failure: Secondary | ICD-10-CM

## 2023-07-14 DIAGNOSIS — I10 Essential (primary) hypertension: Secondary | ICD-10-CM | POA: Diagnosis not present

## 2023-07-14 LAB — POCT INR: INR: 2.4 (ref 2.0–3.0)

## 2023-07-14 NOTE — Patient Instructions (Addendum)
Description   Continue taking 1/2 tablet daily except 1 tablet on Mondays, Wednesdays and Fridays. Recheck INR in 6 weeks.  [facility] [phone].

## 2023-07-14 NOTE — Patient Instructions (Addendum)
Medication Instructions:  Stop Praluent due to cost Continue all other medications *If you need a refill on your cardiac medications before your next appointment, please call your pharmacy*   Lab Work: None ordered   Testing/Procedures: None ordered   Follow-Up: At [facility], you and your health needs are our priority.  As part of our continuing mission to provide you with exceptional heart care, we have created designated Provider Care Teams.  These Care Teams include your primary Cardiologist (physician) and Advanced Practice Providers (APPs -  Physician Assistants and Nurse Practitioners) who all work together to provide you with the care you need, when you need it.  We recommend signing up for the patient portal called "MyChart".  Sign up information is provided on this After Visit Summary.  MyChart is used to connect with patients for Virtual Visits (Telemedicine).  Patients are able to view lab/test results, encounter notes, upcoming appointments, etc.  Non-urgent messages can be sent to your provider as well.   To learn more about what you can do with MyChart, go to [email or web address].    Your next appointment:  6 months    Call in Jan to schedule March appointment    Provider:  Dr.Jordan

## 2023-07-21 ENCOUNTER — Other Ambulatory Visit (HOSPITAL_COMMUNITY): Payer: Self-pay

## 2023-07-24 LAB — LAB REPORT - SCANNED
A1c: 6.3
EGFR: 57

## 2023-08-18 ENCOUNTER — Inpatient Hospital Stay
Admission: RE | Admit: 2023-08-18 | Discharge: 2023-08-18 | Disposition: A | Discharge status: Home / Self Care | Payer: Medicare Other | Source: Ambulatory Visit | Attending: Internal Medicine | Admitting: Internal Medicine

## 2023-08-18 DIAGNOSIS — Z122 Encounter for screening for malignant neoplasm of respiratory organs: Secondary | ICD-10-CM

## 2023-08-20 ENCOUNTER — Ambulatory Visit: Payer: Medicare Other | Admitting: Internal Medicine

## 2023-08-20 ENCOUNTER — Encounter: Payer: Self-pay | Admitting: Internal Medicine

## 2023-08-20 ENCOUNTER — Ambulatory Visit (INDEPENDENT_AMBULATORY_CARE_PROVIDER_SITE_OTHER): Payer: Medicare Other | Admitting: Internal Medicine

## 2023-08-20 VITALS — BP 142/89 | HR 81 | Ht 65.5 in | Wt 185.0 lb

## 2023-08-20 DIAGNOSIS — J449 Chronic obstructive pulmonary disease, unspecified: Secondary | ICD-10-CM | POA: Diagnosis not present

## 2023-08-20 DIAGNOSIS — Z889 Allergy status to unspecified drugs, medicaments and biological substances status: Secondary | ICD-10-CM | POA: Diagnosis not present

## 2023-08-20 DIAGNOSIS — J9611 Chronic respiratory failure with hypoxia: Secondary | ICD-10-CM

## 2023-08-20 DIAGNOSIS — Z122 Encounter for screening for malignant neoplasm of respiratory organs: Secondary | ICD-10-CM | POA: Diagnosis not present

## 2023-08-20 NOTE — Patient Instructions (Addendum)
ICD-10-CM   1. Chronic respiratory failure with hypoxia (HCC)  J96.11     2. 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 Multiple drug allergies  Z88.9     4. Screening for lung cancer  Z12.2         Chronic respiratory failure with hypoxia (HCC) Emphysema  -stable disease. No flare up - lot of emotional stressors now  Plan - Continue ipratropium 3 mL every 6 hours as needed for shortness of breath or wheezing. Use twice daily for maintenance  - Continue singulair 10 mg At bedtime -  - Continue supplemental oxygen 2-3 lpm with activity and at night. Goal oxygen >88-90%  - Continue Mucinex 600 mg Twice daily for chest congestion/cough   Vaccine counseling Multiple drug allergies  -Respect hesitancy towards COVID, RSV and flu vaccine  Plan - Monitor clinically   Screening for lung cancer  -No evidence of pneumonia on CT scan of the chest October 2024.  No lung nodules no lung cancer.  No pulmonary fibrosis  - -No further CT scans per USPTF [place]) after age 21 for lung cancer screening though [employer] wants you to have yearly scans till age 33  plan - no futher CT scan for this based on shared decision making -Smoking to remain in remission     Follow up -6 months

## 2023-08-20 NOTE — Progress Notes (Signed)
OV 05/19/2016  Chief Complaint  Patient presents with   Follow-up    Pt c/o worsening SOB, prod cough with thick white mucus.      This is a routine follow-up. Last visit was in April 2017 with nurse practitioner. At that time treated for COPD exacerbation according to her history but review of the chart does not show that to be true. At this point in time she says COPD stable although she says she might be in flare up on account of her fibromyalgia. Starting her symptoms out it appears that it is generally stable with dyspnea at baseline and cough with mild sputum at baseline. She is more hobbled by her chronic pain and fibromyalgia. Couple months ago apparently a hydrocodone was discontinued by rheumatology and therefore she is having "withdrawal". She does have a new pain medication physician. She is on gabapentin for fibromyalgia. There are no other new issues. She does not want antibiotics or prednisone for her perceived exacerbation  OV 11/17/2016  Chief Complaint  Patient presents with   Follow-up    Pt states her SOB has worsened since last OV. Pt states she has a burning in her chest when she becomes SOB. Pt c/o prod cough with white mucus in morning, cough becomes nonprod throughout the day.     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 Last CT chest March 2017 without any mass and associated mild cor pulmonale   Six-month follow-up visit. She is completely overwhelmed by her fibromyalgia and depression. The fibromyalgia is worse. She says she is change doctors because of this. She's had a few admissions in the interim but none of them give a COPD exacerbation according to chart review. She suffered frustrated by her heels oxygen system even though it is light it is causing her pain. She uses Atrovent nebulizer but wants change to something else but at the same time has rejected use of any other  nebulizer or oral inhaler because of side effects. She is burning chest pain with inspiration and associated with her costochondral junction trigger points. 2014 review of the chart shows normal cardiac stress test. November 2017 chest x-ray is clear. She does not want any further imaging. Chest pain is mild to severe and variable. Worsened with inspiration. No radiation associated wheezing. No sputum production  OV 05/25/2017  Chief Complaint  Patient presents with   Follow-up    Pt states her SOB has worsened since last OV in 11/2016. Pt states she only coughs after her neb treatment - pt states her mucus is yellow in color and c/o occ chest discomfort. Pt denies f/c/s.     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 Last CT chest March 2017 without any mass and associated mild cor pulmonale   Follow-up exertional hypoxemia associated with his emphysema. Also associated with fibromyalgia. Last visit she had atypical chest pain. Recommended she see cardiology. 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 According to her history she has significant coronary artery disease but is fairly advanced. She is frustrated with this but realistic. She feels her days are number. In terms of COPD emphysema and is stable. She is on Atrovent inhalers. She is on oxygen. She wants a lighter system. She still burden by fibromyalgia. 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  She uses oxygen and nebulizers.  She is not interested in rehab or vaccines.  New issue: Preoperative pulmonary evaluation.  She is having significant bilateral lower extremity claudication.  She states that she is in severe pain walking from her door to the mailbox to the point she is almost crying.  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  Apparently Dr. Myra Gianotti wants to try a laparotomy approach.  However Dr. Sherry Ruffing might take her to the cardiac Cath Lab and placed stents.  She says she is sensitive to fentanyl and is worried about anesthesia complications but the pain is so severe she is willing to take the risk.  She has had previous cardiac catheterization without any problems other than being sensitive to fentanyl.  She says this can be done in the cardiac Cath Lab with anesthesia support.  She wants me to talk to Dr. Leamon Arnt and Dr. Myra Gianotti about this.   OV 07/18/2018  Subjective:  Patient ID: Laurie Horton, female , DOB: [birth year] , age 73 y.o. , MRN: [id number] , ADDRESS: [address] [place] [address]   07/18/2018 -   Chief Complaint  Patient presents with   Follow-up    Pt states her chest is hurting her all the time now and states she does not think her neb solutions are working for her anymore now. Pt also states she has had some worsening SOB and is also coughing up white phlegm which is comes out in chunks.     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  She uses oxygen and nebulizers. Last CT chest March 2017 without any mass and associated mild cor pulmonale   HPI Laurie Horton 77 y.o. -after last visit she saw  nurse practitioner in June 2019 fora mild respiratory flare. At the time treated with allergy medications antihistamines.She tells me thathe last saw me January 2019 she had an iliac stent place in the left side and after that her effort tolerance is improved but in the last few months she's noticed a decrease in effort tolerance with worsening dyspnea and also increased cough and increased sputum production in volume and also consistency without change in color This no fever or weight loss. She will not have a flu shot because of prior allergy. Her last CT scan of the chest was in 2017 and she is requesting for another one. Her inhaler as ipratropium which she says she's not happy with. In the past she's uses Spiriva and Symbicort and these have caused blisters and so she is generally where you have inhalers although she wants something other than her current one.       08/19/2018  - Visit   [name].  Patient was last seen in our office visit on  07/18/2018 started on Bevespi.  Patient reports that after 1 day of use of the Bevespi inhaler she developed thrush and mouth sores.  She she contacted our office to be treated with nystatin.  Patient reports that most mouth sores resolved there remains one.  Patient is also having extensive dental work done.  Patient also has been treated for scabies as well as impetigo by dermatology recently.   Patient completed a high-res CT in 07/29/2018 that showed no real changes from baseline.  Still showing severe emphysema.   Patient reports that she continues to use her Atrovent nebulizer but is wondering if there is any other options available for her.  Patient feels that the Atrovent nebulizer is not working as well.  Patient is currently using as needed and not using it scheduled.  MMRC - Breathlessness Score 3 - I stop for breath after walking about 100 yards or after a few minutes on level ground (isle at grocery  store is 125ft)  Patient reports that she has known triggers of shortness of breath with exertion, when there is high pollen counts, when she is walking, when she is outside for extended periods of time.  Patient reports she is been using 2 L via nasal cannula of oxygen with exertion as well as at rest.  Patient reports she forgot her POC at home.  She arrived to our office on room air.  Oxygen saturations 87.  Patient refused oxygen in our office and states that she does not think she needs oxygen right now.  Patient is sad and concerned regarding her limitations with vascular surgery.  Patient believes that she needs a surgery but reports that vascular team as well as cardiology does not think that she would be a good surgical candidate.  She reports that she has not heard back from Dr. Benay Spice office regarding her most recent test results.   Tests:  01/21/2016-CT chest without contrast- moderate centrilobular emphysema and diffuse bronchial wall thickening mild subpleural density in the dependent lower lobes  01/20/2016-pulmonary function test- airway obstruction and diffusion defect suggesting emphysema  Imaging:  02/11/2017-chest x-ray-stable large cardiac silhouette, lungs are hyperinflated, interstitial edema pattern unchanged from prior scans   Cardiac:  07/08/2017-echocardiogram-LV ejection fraction 50 to 55%, grade 1 diastolic dysfunction      OV 12/21/2018  Subjective:  Patient ID: Laurie Horton, female , DOB: Feb 05, 1946 , age 45 y.o. , MRN: [id number] , ADDRESS: [address] 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  She uses oxygen and nebulizers. Last CT chest March 2017 without any mass and associated mild cor pulmonale  12/21/2018 -   Chief Complaint  Patient presents with   Follow-up    Pt states due to being switched to a  new medication, she has had labored breathing. SOB is with exertion, has an occ cough with white phlegm, and also has had some occ CP.     HPI Laurie Horton 77 y.o. -  Presents for routine follow-up.  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  COPD CAT score is 25 and she feels stable.  She uses oxygen sporadically and nebulizer sporadically.  She is very afraid of medicines because of multiple allergies.  She says her psychiatrist Dr. Vickie Epley placed her on some medications that caused a rash.  Other than that she is okay.      OV  09/15/2019  Subjective:  Patient ID: Laurie Horton, female , DOB: Sep 11, 1946 , age 32 y.o. , MRN: [id number] , ADDRESS: [address] 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  She uses oxygen and nebulizers. Last CT chest March 2017 without any mass and associated mild cor pulmonale   09/15/2019 -  No chief complaint on file.    HPI Laurie Horton 77 y.o. -     ROS - per HPI     OV 01/17/2020  Subjective:  Patient ID: Laurie Horton, female , DOB: [birth year] , age 89 y.o. , MRN: [id number] , ADDRESS: [address] [place] [address]   01/17/2020 -   Chief Complaint  Patient presents with   Follow-up     HPI Laurie Horton 77 y.o. -presents for face-to-face follow-up.  In December she called with a COPD flareup symptoms.  She wanted Biaxin despite her allergies.  She feels that Biaxin helps her.  She says she was given prednisone which helped but she was only given generic clarithromycin instead of the tradename Biaxin.  She says it did not work.  She wants tradename Biaxin only.  She says since then her cough is more than baseline.  She also is like sputum that is discolored.  She feels like she is in exacerbation and this is preventing her from getting the COVID-19  vaccine.  She feels another course of tradename Biaxin is required.  She again does not want generic clarithromycin.  She is compliant with her baseline nebulizer nighttime oxygen.  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  She is now recovering from that.  She talked about Covid vaccine.  She wants to get it.  He has had pneumonia vaccine without problem but recently flu shot she feels this put her in the hospital.  She also has multiple oral drug allergies.  Overall she continues to mask and follow social distancing.      12/19/2020 -   Chief Complaint  Patient presents with   Follow-up    Had Covid PNA 10/2020, doing some better     HPI Laurie Horton 78 y.o. -presents for follow-up of her COPD.  She continues to use Atrovent nebulizer.  She prefers nebulizers of inhaler.  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  She uses oxygen at night with exertion.  She tells me that in December 2021 around Christmas she had respiratory viral symptoms.  She says rapid antigen test by her daughter was negative.  Then in January 2022 she followed up with primary care physician who did a chest x-ray that showed pneumonia.  I have the chest x-ray with me visualized it.  There is an infiltrate.  She says she was told that she had Covid but there is no confirmatory evidence for this.  She treated herself at home.  She cannot have vaccines because of multiple drug allergies according to history.  I supported her in the decision making.  She is worried about abnormal chest x-ray.        CAT Score 12/19/2020  Total CAT Score 18       OV 01/30/2021  Subjective:  Patient ID: Laurie Horton, female , DOB: 02/14/1946 , age 80 y.o. , MRN: [id number] , ADDRESS: [address]  [place] [address] PCP Shade Flood, MD Patient Care Team: Shade Flood, MD as PCP - General (Family Medicine) Runell Gess, MD as  PCP - Cardiology (Cardiology) Verdon Cummins, MD as Referring Physician (Physical Medicine and Rehabilitation) Carman Ching, MD (Inactive) as Consulting Physician (Gastroenterology) Micki Riley, MD as Consulting Physician (Neurology) [place], Peter M, MD as Consulting Physician (Cardiology) [name], MD as Consulting Physician (Pulmonary Disease) Cleotis Nipper, MD as Consulting Physician (Psychiatry) Runell Gess, MD as Consulting Physician (Peripheral Vascular Disease)  This Provider for this visit: Treatment Team:  Attending Provider: [name], MD    01/30/2021 -   Chief Complaint  Patient presents with   Follow-up    Doing ok, breathing is the same     HPI Laurie Horton 77 y.o. -presents for follow-up for COPD.  Here to review the results.  Her daughter Zissy Eames is on the phone.  No new interim complaints.  A Covid IgG is negative thus making her recent viral infection is unlikely as Covid.  I offered a referral for Covid monoclonal antibody prophylaxis but she declined.  Her pulmonary function test shows 12% decline in FEV1 in 5 years.  I reviewed the chart and do not find an alpha-1 check.  I presume this was done prior to [place] using electronic medical records.  She is open to getting it tested again.  She is to go since 2 COPD.  She tells me that the DME company is out of supply with tubing for her nebulizer.  She has used an inhaler but she is somewhat skeptical because of nasal septal perforation.  Explained to her inhalers oral.  She then talked about dentures.  Explained that we can do inhalers through an [facility].  She had high-resolution CT chest.  This shows emphysema.  There is evidence of cor pulmonale.  There is stable thoracic aorta aneurysm.  No fibrosis no cancer.   CAT Score 01/30/2021 12/19/2020  Total CAT Score 10 18        Ref Range & Units 1 mo ago  SARS COV1 AB(IGG)SPIKE,SEMI QN <1.00 index <1.00          IMPRESSION: 1. No evidence of interstitial lung disease. Air trapping is indicative of small airways disease. 2. Ascending aortic aneurysm, stable. Recommend annual imaging followup by CTA or MRA. This recommendation follows 2010 ACCF/AHA/AATS/ACR/ASA/SCA/SCAI/SIR/STS/SVM Guidelines for the Diagnosis and Management of Patients with Thoracic Aortic Disease. Circulation. 2010; 121: B284-X324. Aortic aneurysm NOS (ICD10-I71.9). 3. Aortic atherosclerosis (ICD10-I70.0). Coronary artery calcification. 4. Enlarged pulmonic trunk, indicative of pulmonary arterial hypertension. 5.  Emphysema (ICD10-J43.9).     Electronically Signed   By: Leanna Battles M.D.   On: 01/15/2021 11:52     OV 07/22/2021  Subjective:  Patient ID: Laurie Horton, female , DOB: 19-May-1946 , age 31 y.o. , MRN: [id number] , ADDRESS: [address] [place] [address] PCP Shade Flood, MD Patient Care Team: Shade Flood, MD as PCP - General (Family Medicine) [place], Peter M, MD as PCP - Cardiology (Cardiology) Verdon Cummins, MD as Referring Physician (Physical Medicine and Rehabilitation) Carman Ching, MD (Inactive) as Consulting Physician (Gastroenterology) Micki Riley, MD as Consulting Physician (Neurology) [place], Peter M, MD as Consulting Physician (Cardiology) [name], MD as Consulting Physician (Pulmonary Disease) Lolly Mustache Phillips Grout, MD as Consulting Physician (Psychiatry)  This Provider for this visit: Treatment Team:  Attending Provider: [name], MD    07/22/2021 -  Chief Complaint  Patient presents with   Follow-up    Pt states she had pneumonia since last visit. States she went to see PCP after finishing meds and was told she still had infection and was placed back on abx by PCP.   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   She uses oxygen and nebulizer  HPI Laurie Horton 77 y.o. -returns for follow-up.  On 06/18/2021 she called with COPD exacerbation symptoms.  Gave her Biaxin and prednisone.  But she tells me that because the line was busy our office did not call in the medications till 5 days later when they got hold of her.  She says she is only partially improved and she is her primary care physician.  She had chest x-ray 07/16/2021.  This reports of interstitial prominence.  However there is no mass or consolidation.  She had basic labs that I reviewed and it is normal.  Apparently primary care physician gave her another Biaxin.  But there is no prednisone.  She still has yellow symptoms.  Symptoms only partially resolved.  She feels another round of prednisone will help her.  Of note we put her on triple inhaler BREZTRI last visit but this gave her thrush.  She is taking ipratropium.  If she only takes it 3 times a day when she has tremors that she is managing just with 2 times a day and this is insufficient.  She is using oxygen continuous now up to 2-1/2 L.  She is now moved downstairs because of her shortness of breath.  She is wanting oxygen concentrator upstairs and downstairs.  In the last few to several years she is refused flu shot.  She will not have the COVID-vaccine either.      OV 08/21/2021  Subjective:  Patient ID: Laurie Horton, female , DOB: [birth year] , age 76 y.o. , MRN: [id number] , ADDRESS: [address] [place] [address] PCP Shade Flood, MD Patient Care Team: Shade Flood, MD as PCP - General (Family Medicine) [place], Peter M, MD as PCP - Cardiology (Cardiology) Verdon Cummins, MD as Referring Physician (Physical Medicine and Rehabilitation) Carman Ching, MD (Inactive) as Consulting Physician (Gastroenterology) Micki Riley, MD as Consulting Physician (Neurology) [place], Peter M, MD as Consulting Physician (Cardiology) [name], MD as  Consulting Physician (Pulmonary Disease) Lolly Mustache Phillips Grout, MD as Consulting Physician (Psychiatry)  This Provider for this visit: Treatment Team:  Attending Provider: [name], MD  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  She uses oxygen and nebulizer  08/21/2021 -   Chief Complaint  Patient presents with   Follow-up    Pt was at the ED 10/17 after PCP sent her there. States that she has been using Yupelri neb sol. States that it does make her feel dizzy but has helped with her breathing better than prior neb sol.     HPI ARLESHA SPELMAN 77 y.o. -returns for follow-up to have a follow-up chest x-ray because of his concerns of interstitial edema versus atypical pneumonia at last visit.  I look to the follow-up chest x-ray.  Personal visualization shows it is unchanged.  I think this is a baseline based on the fact his CT scan of the chest in March 2022 look very similar.  She is feeling stable.  Also last  visit we put her on Yupelri nebulizer.  Part of this follow-up was to see how she is doing with that.  She says it makes her dizzy but then after a while she is able to tolerate it.  So she is careful and some days she does not use it.  But overall she feels its more beneficial than ipratropium.  Therefore she wants to stick with it.  Of note on 08/18/2021 she did have a video visit with her psychiatrist for depression.  She also has had a skin biopsy in her left thigh and apparently this was infected and she is sent to the ED for IV antibiotics but there was 11-hour wait and she left AMA without being seen.  She is very upset about her experience.  She is reflecting on it and is wondering whether she should go to the medical board and file a formal complaint.  But overall from a respiratory standpoint she is stable.    The other issues that she wants to oxygen  concentrator's.  1 5 stairs and 1 for downstairs.  Last visit I thought I put an order in but she says at that never got it.  She feels service at [facility] is terrible.  I informed her that we will do the order again.          OV 11/13/2021  Subjective:  Patient ID: Laurie Horton, female , DOB: 1946/09/08 , age 34 y.o. , MRN: [id number] , ADDRESS: [address] [place] [address] PCP Georgianne Fick, MD Patient Care Team: Georgianne Fick, MD as PCP - General (Internal Medicine) [place], Peter M, MD as PCP - Cardiology (Cardiology) Verdon Cummins, MD as Referring Physician (Physical Medicine and Rehabilitation) Carman Ching, MD (Inactive) as Consulting Physician (Gastroenterology) Micki Riley, MD as Consulting Physician (Neurology) [place], Peter M, MD as Consulting Physician (Cardiology) [name], MD as Consulting Physician (Pulmonary Disease) Lolly Mustache Phillips Grout, MD as Consulting Physician (Psychiatry)  This Provider for this visit: Treatment Team:  Attending Provider: [name], MD    11/13/2021 -   Chief Complaint  Patient presents with   Follow-up    Pt states her breathing has become a little worse since last visit. States she has had a virus for the past 10 days.   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  She uses oxygen and nebulizer  HPI Laurie Horton 77 y.o. -returns for follow-up.  Is a 52-month routine follow-up.  She tells me that some of her neighbors got sick with the flu.  She had flulike symptoms which she is flu negative.  She was in bed for a week.  She felt it was mild.  She did not call us.  She is back to baseline now.  Overall she is stable.  She tells me that her biggest issue is the portable oxygen.  She says Medicare refused to give her 2 systems 1 5 stairs and 1 for downstairs.  Currently she  has a Lawyer inside the house that she plugs and uses it at night.  For exertion she uses a Helio system.  She feels that he does system using 100 pound.  Oxygen gallon tank that is outside on the patio.  She wants to bring this inside and then the DME company will run her house for upstairs and downstairs.  She says when she goes into the system  the DME company says she cannot revert back to her concentrator.  She is a little bit nervous about this approach with the biggest hold-up is that the DME company is asking for credit card information and she is worried about identity theft.  And price gouging   OV 05/22/2022  Subjective:  Patient ID: Laurie Horton, female , DOB: 03/31/1946 , age 80 y.o. , MRN: [id number] , ADDRESS: [address] [place] [address] PCP Georgianne Fick, MD Patient Care Team: Georgianne Fick, MD as PCP - General (Internal Medicine) [place], Peter M, MD as PCP - Cardiology (Cardiology) Verdon Cummins, MD as Referring Physician (Physical Medicine and Rehabilitation) Carman Ching, MD (Inactive) as Consulting Physician (Gastroenterology) Micki Riley, MD as Consulting Physician (Neurology) [place], Peter M, MD as Consulting Physician (Cardiology) [name], MD as Consulting Physician (Pulmonary Disease) Lolly Mustache Phillips Grout, MD as Consulting Physician (Psychiatry)  This Provider for this visit: Treatment Team:  Attending Provider: [name], MD  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  She uses oxygen and nebulizer . Last CT chest MArch 2022  05/22/2022 -   Chief Complaint  Patient presents with   Follow-up    Follow-up SOB, Coughing, wheezing     HPI Laurie Horton 77 y.o. -returns for follow-up.  This is a routine follow-up but she tells me that since early June 2023 she has been sick.   Apparently she finished grocery shopping and then came home had chills diarrhea and cough shortly after that with a severe cough she had hemoptysis that started and resolved a week later but she was in bed for 21 days.  She was increased use of nebulizer.  She believes it was a respiratory virus.  Apparently repeated COVID test was negative.  She was so ill that her daughters who live an hour and a half however dropping off food for her.  She lives alone.  Currently she is better but still having wheezing.  She is still using nebulizer twice daily.  She is also got postnasal drip and she is needing Benadryl but the hemoptysis resolved.  She is willing to take prednisone.  She continues oxygen and her regular nebulizer and Singulair.  Last CT scan of the chest was over a year ago.  She is willing to get this repeated.   05/22/2022: OV with Dr. Marchelle Gearing. Intended to be routine follow up but she had been having sick symptoms since June 2023. She had gone grocery shopping and came home with chills, diarrhea, and cough. She also developed hemoptysis, which resolved after a week. Stated she was in bed for 21 days. Doing better at OV but still wheezing. She is also having postnasal drip. 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 Continue supplemental O2. Continue nebulized medications. Continued singulair. Plan for CT chest in 2-4 weeks.   06/09/2022: OV with Cobb NP for follow-up after being treated for slow to resolve AECOPD related to respiratory virus.  She completed prednisone course and had scheduled CT in the interim.  CT scan showed a right middle lobe airspace disease, concerning for pneumonia.  She has not had any antibiotics up to this point.  Today, she reports still having some increased shortness of breath from her baseline.  She did feel better when she was on the prednisone.  She also has a productive cough with yellow to green sputum and chest congestion.  She has not had any further  episodes of hemoptysis.  She does have an occasional wheeze, which improves with her nebulizer treatments.  She denies any fevers, night sweats, orthopnea, leg swelling, URI symptoms, interim sick exposures.  She is using her DuoNeb 3 times a day.  She states that she is unable to tolerate any and all inhalers as they cause irritation to the back of her throat and mouth.  She takes Singulair at bedtime.  She continues on her supplemental oxygen with activity and at night.  Usually uses 2-3 lpm.  Viral respiratory infection in June with persistent AECOPD. Treated with prednisone taper in July. CT chest 7/28 revealed a new RML airspace consolidation, concerning for pna given her infectious symptoms. We will treat her with augmentin 7 day course. Advised to use mucinex to assist with mucociliary clearance. Repeat CT chest 6 weeks after completion of abx.  06/23/2022: Today - follow up Patient presents today for follow up after being treated for AECOPD and pneumonia. She reports that she has been feeling much better since she completed her abx and prednisone. Breathing is back to baseline and her cough is clearing up, usually with very pale yellow or clear sputum. She does still feel more fatigued than usually but feels like she's slowly building her stamina back up. Denies any fevers, chills, night sweats, hemoptysis, chest congestion, orthopnea, leg swelling. She continues to use her ipratropium neb for maintenance 3 times a day. Takes singulair at bedtime. Maintaining oxygen on 2-3 lpm.    OV 08/27/2022  Subjective:  Patient ID: Laurie Horton, female , DOB: Apr 16, 1946 , age 79 y.o. , MRN: [id number] , ADDRESS: [address] Laurie Horton [place] [address] PCP Georgianne Fick, MD Patient Care Team: Georgianne Fick, MD as PCP - General (Internal Medicine) [place], Peter M, MD as PCP - Cardiology (Cardiology) Verdon Cummins, MD as Referring Physician (Physical Medicine and  Rehabilitation) Carman Ching, MD (Inactive) as Consulting Physician (Gastroenterology) Micki Riley, MD as Consulting Physician (Neurology) [place], Peter M, MD as Consulting Physician (Cardiology) [name], MD as Consulting Physician (Pulmonary Disease) Lolly Mustache Phillips Grout, MD as Consulting Physician (Psychiatry)  This Provider for this visit: Treatment Team:  Attending Provider: [name], MD  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  She uses oxygen and nebulizer . Last CT chest MArch 2022  08/27/2022 -   Chief Complaint  Patient presents with   Follow-up    Follow-up visit PT here to discuss results of CT scan Wants walk test to qualify for inogen      HPI Laurie Horton 77 y.o. -returns for follow-up.  She states that she is now been approved by her [place] [employer] for [employer] oxygen system because the regular oxygen system is heavy.  She needs a qualifying walk otherwise shortness of breath is at baseline.  She had CT scan of the chest without contrast.  The right middle lobe pneumonia is resolved.  No pulmonary fibrosis no lung cancer no lung nodule no pneumonia anymore.  She does have aortic root dilatation 4.1 cm.  She continues her ipratropium and Singulair.  She is refused all vaccines because of multiple drug allergies.  Otherwise no new issues  Social: Her granddaughter age 40 has new diagnosis of breast cancer and she is upset about it.   CT Chest data 08/18/22  Narrative & Impression  CLINICAL DATA:  Pneumonia, complication suspected, xray  done   Community acquired pneumonia of right middle lobe.   EXAM: CT CHEST WITHOUT CONTRAST   TECHNIQUE: Multidetector CT imaging of the chest was performed following the standard protocol without IV contrast.   RADIATION DOSE REDUCTION: This exam was performed according to  the departmental dose-optimization program which includes automated exposure control, adjustment of the mA and/or kV according to patient size and/or use of iterative reconstruction technique.   COMPARISON:  CT 05/29/2022   FINDINGS: Cardiovascular: Fusiform aneurysmal dilatation of the ascending aorta maximal dimension 4.1 cm, previously 4 cm. The thoracic aorta is densely calcified and tortuous. The origin of the left subclavian artery is densely calcified. Dilated main pulmonary artery at 3.5 cm. Upper normal heart size with dense coronary artery calcifications. No pericardial effusion.   Mediastinum/Nodes: Scattered small nonenlarged mediastinal lymph nodes, stable from prior exam and likely reactive. No suspicious mediastinal adenopathy. Assessment for hilar adenopathy is limited on this unenhanced exam. Unremarkable appearance of the esophagus.   Lungs/Pleura: The previous right middle lobe airspace disease has resolved. No evidence of underlying mass. Moderate to advanced emphysema. Minor dependent atelectasis in the right lower lobe. No suspicious pulmonary nodule or mass. There is retained mucus within the right mainstem and lower lobe bronchus. No pleural fluid.   Upper Abdomen: No acute findings.   Musculoskeletal: Mild scoliosis with diffuse degenerative change in the thoracic spine. There are no acute or suspicious osseous abnormalities. No chest wall soft tissue abnormalities.   IMPRESSION: 1. Previous right middle lobe airspace disease has resolved. No residual or evidence of underlying mass. 2. 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 Recommend annual imaging followup by CTA or MRA. This recommendation follows 2010 ACCF/AHA/AATS/ACR/ASA/SCA/SCAI/SIR/STS/SVM Guidelines for the Diagnosis and Management of Patients with Thoracic Aortic Disease. Circulation. 2010; 121: H846-N629. Aortic aneurysm NOS  (ICD10-I71.9) 3. Aortic atherosclerosis and coronary artery calcifications. 4. Dilated main pulmonary artery suggesting pulmonary arterial hypertension. 5. Moderate to advanced emphysema   Aortic Atherosclerosis (ICD10-I70.0) and Emphysema (ICD10-J43.9).     Electronically Signed   By: Narda Rutherford M.D.   On: 08/19/2022 20:37      OV 01/15/2023  Subjective:  Patient ID: Laurie Horton, female , DOB: 06-28-1946 , age 39 y.o. , MRN: [id number] , ADDRESS: [address] Laurie Horton [place] [address] PCP Georgianne Fick, MD Patient Care Team: Georgianne Fick, MD as PCP - General (Internal Medicine) [place], Peter M, MD as PCP - Cardiology (Cardiology) Verdon Cummins, MD as Referring Physician (Physical Medicine and Rehabilitation) Carman Ching, MD (Inactive) as Consulting Physician (Gastroenterology) Micki Riley, MD as Consulting Physician (Neurology) [place], Peter M, MD as Consulting Physician (Cardiology) [name], MD as Consulting Physician (Pulmonary Disease) Lolly Mustache Phillips Grout, MD as Consulting Physician (Psychiatry)  This Provider for this visit: Treatment Team:  Attending Provider: [name], MD    01/15/2023 -   Chief Complaint  Patient presents with   Follow-up    Patient saw PCP.  Treated for yeast infection.  Rx Flagyl and  Diflucan.  INR level at 7.6.  PCP d/c'd meds.  Diet a lot of greens.  Patient improving.  C/o wheeze and cough with yellow phlem.  Some weakness.   Fu emphysema  HPI Laurie Horton 77 y.o. -  presents for routine followup. Last few weeks was having yellow sputum and cough with this. DOes  not want antibioptics or prenisone due to side effect and yeast infection ris, Dyspnea is stable  though at 1-2L [place] at rest. Does chair exrcises but reduced to every other day to give recovery time. REcently with flagyl and diflucan INR went up. She cannot afford eliquis she says.So tried to enroll in a clinical tril with Dr  Pearlean Brownie but opted against it0due to AE risk concern      OV 05/20/2023  Subjective:  Patient ID: Laurie Horton, female , DOB: 1945-12-18 , age 30 y.o. , MRN: [id number] , ADDRESS: [address] Laurie H[place] [address] PCP Georgianne Fick, MD Patient Care Team: Georgianne Fick, MD as PCP - General (Internal Medicine) [place], Peter M, MD as PCP - Cardiology (Cardiology) Verdon Cummins, MD as Referring Physician (Physical Medicine and Rehabilitation) Carman Ching, MD (Inactive) as Consulting Physician (Gastroenterology) Micki Riley, MD as Consulting Physician (Neurology) [place], Peter M, MD as Consulting Physician (Cardiology) [name], MD as Consulting Physician (Pulmonary Disease) Lolly Mustache Phillips Grout, MD as Consulting Physician (Psychiatry)  This Provider for this visit: Treatment Team:  Attending Provider: [name], MD    05/20/2023 -   Chief Complaint  Patient presents with   Follow-up    Coughing up yellow.  Recently diagnosed with scoliosis.     HPI GEANNE NISHIOKA 77 y.o. -presents for follow-up.  She tells me that she feels she has ongoing exacerbation.  Sputum is still yellow.  In May 2024 we called in some clarithromycin and prednisone but she felt the course was too short.  She wants another round of antibiotics for sure.  Will also offer her some prednisone if she wants she can take it.  Reviewed labs of previous eosinophils normal.  She does take Praluent.  I discussed the concept of biologic for COPD exacerbation prevention.  She is open to this idea and is willing to get CBC with differential checked.  Otherwise she is due for CT scan low-dose in the fall 2024 for lung cancer screening.    No other issues other than the fact in the interim for the last month she has had left-sided breast pain she has been given some increased Lasix and she has an MRI pending with her primary care doctor.    LAB RESULTS last 96 hours No results  found.  LAB RESULTS last 90 days   Latest Reference Range & Units 05/28/09 11:45 04/09/10 08:50 07/10/11 19:24 09/27/12 11:27 05/04/13 10:40 03/29/14 09:32 12/06/14 09:15 07/11/15 08:28 08/03/16 16:40 08/18/16 08:43 09/01/16 11:58 02/11/17 01:46 08/29/18 06:31 11/22/19 18:07 08/18/21 12:26 12/05/21 21:32  Eosinophils Absolute 0.0 - 0.5 K/uL 0.1 0.1 0.2 0.2 0.1 0.1 0.1 0.1 0.0 67 0.1 0.1 0.1 0.1 0.3 0.1     OV 08/20/2023  Subjective:  Patient ID: Laurie Horton, female , DOB: 09-01-1946 , age 56 y.o. , MRN: [id number] , ADDRESS: [id number] Mar[place]u H[place] [address] PCP Georgianne Fick, MD Patient Care Team: Georgianne Fick, MD as PCP - General (Internal Medicine) [place], Peter M, MD as PCP - Cardiology (Cardiology) Verdon Cummins, MD as Referring Physician (Physical Medicine and Rehabilitation) Carman Ching, MD (Inactive) as Consulting Physician (Gastroenterology) Micki Riley, MD as Consulting Physician (Neurology) [place], Peter M, MD as Consulting Physician (Cardiology) [name], MD as Consulting Physician (Pulmonary Disease) Lolly Mustache Phillips Grout, MD as Consulting Physician (Psychiatry)  This Provider for this visit: Treatment Team:  Attending Provider: [name], MD    08/20/2023 -   Chief Complaint  Patient presents with   Follow-up    Increased DOE today bc she  is having stress at home and she was rushing this morning. She is here to review her LDCT.      HPI Laurie Horton 77 y.o. -here for follow-up  #COPD with chronic respiratory failure continues to be stable.  She will not have vaccines.  On current medications and she is stable no flareup currently  #Lung cancer screening she had a CT scan of the chest no evidence of lung cancer annual screening recommended but she is worried about radiation risk.  Did explain to her that [place] preventive task force recommend CT scan till age 54 and she is 35 right now and no further  CT scan required but according to [employer] [insurance not paying for this recommendation] CT scan recommended low-dose till age 56.  We then took a shared decision making for her to stop lung cancer screening.  This is predominantly out of a concern and I felt this was acceptable risk  #Respiratory vaccines: She has declined this  #Social stressors she is having a lot of social stressors right now.  Fibromyalgia is acting up.  In fact after blood pressure cuff application she started crying because it caused her pain.  When I touched her right arm she winced in pain.  She tells me that on July 24, 2019 for 1 day after her daughter turned 3 daughter had a stroke and currently cannot cook.  The daughter has autistic grandchildren.  She had to send money to her granddaughter to get diapers.  In addition the granddaughter isfrom her husband.  Patient is very upset about all these.     Simple office walk 185 feet x  3 laps goal with forehead probe 08/27/2022    O2 used ra   Number laps completed 1 of 3 laps   Comments about pace x   Resting Pulse Ox/HR 98% and 83/min   Final Pulse Ox/HR 99% and 98/min   Desaturated </= 88% yes   Desaturated <= 3% points yes   Got Tachycardic >/= 90/min yes   Symptoms at end of test Mod-severe dyspnea   Miscellaneous comments Needing 2L [place] to correct and do 1 lap    CT Chest data from date: 08/19/23 Narrative & Impression  CLINICAL DATA:  90 pack-year smoking history/quit 13 years ago.   EXAM: CT CHEST WITHOUT CONTRAST LOW-DOSE FOR LUNG CANCER SCREENING   TECHNIQUE: Multidetector CT imaging of the chest was performed following the standard protocol without IV contrast.   RADIATION DOSE REDUCTION: This exam was performed according to the departmental dose-optimization program which includes automated exposure control, adjustment of the mA and/or kV according to patient size and/or use of iterative reconstruction technique.    COMPARISON:  08/18/2022 chest CT, diagnostic. No prior screening CT.   FINDINGS: Cardiovascular: No change in mild ascending aortic dilatation at 4.1 cm. Aortic atherosclerosis. Tortuous thoracic aorta. Moderate cardiomegaly, without pericardial effusion. Three vessel coronary artery calcification.   Pulmonary artery enlargement, outflow tract 3.9 cm.   Mediastinum/Nodes: No mediastinal or hilar adenopathy, given limitations of unenhanced CT.   Lungs/Pleura: No pleural fluid. Moderate centrilobular emphysema. Minimal motion degradation.   Isolated left lower lobe pulmonary nodule of 3.9 mm.   Upper Abdomen: Cholecystectomy. Normal imaged portions of the spleen, stomach, pancreas, adrenal glands, kidneys.   Musculoskeletal: Midthoracic spondylosis with mild S-shaped spinal curvature.   IMPRESSION: 1. Lung-RADS 2, benign appearance or behavior. Continue annual screening with low-dose chest CT without contrast in 12 months. 2. Pulmonary artery  enlargement suggests pulmonary arterial hypertension. 3. No change in mild ascending aortic dilatation at 4.1 cm. This can be re-evaluated on routine lung cancer screening follow-up at 12 months. 4.  Emphysema (ICD10-J43.9).  Aortic Atherosclerosis (ICD10-I70.0).     Electronically Signed   By: Jeronimo Greaves M.D.   On: 08/19/2023 10:30        PFT     Latest Ref Rng & Units 01/30/2021    8:51 AM 01/20/2016    9:35 AM 08/14/2013   10:17 AM  ILD indicators  FVC-Pre L 2.61  2.79    FVC-Predicted Pre % 92  93  100   FVC-Post L   3.07   FVC-Predicted Post %   103   TLC L 4.59  4.62  4.32   TLC Predicted % 90  91  88   DLCO uncorrected ml/min/mmHg 9.45  9.75  11.57   DLCO UNC %Pred % 49  40  50   DLCO Corrected ml/min/mmHg 9.45     DLCO COR %Pred % 49         LAB RESULTS last 96 hours CT CHEST LUNG CA SCREEN LOW DOSE W/O CM  Result Date: 08/19/2023 CLINICAL DATA:  90 pack-year smoking history/quit 13 years ago. EXAM: CT  CHEST WITHOUT CONTRAST LOW-DOSE FOR LUNG CANCER SCREENING TECHNIQUE: Multidetector CT imaging of the chest was performed following the standard protocol without IV contrast. RADIATION DOSE REDUCTION: This exam was performed according to the departmental dose-optimization program which includes automated exposure control, adjustment of the mA and/or kV according to patient size and/or use of iterative reconstruction technique. COMPARISON:  08/18/2022 chest CT, diagnostic. No prior screening CT. FINDINGS: Cardiovascular: No change in mild ascending aortic dilatation at 4.1 cm. Aortic atherosclerosis. Tortuous thoracic aorta. Moderate cardiomegaly, without pericardial effusion. Three vessel coronary artery calcification. Pulmonary artery enlargement, outflow tract 3.9 cm. Mediastinum/Nodes: No mediastinal or hilar adenopathy, given limitations of unenhanced CT. Lungs/Pleura: No pleural fluid. Moderate centrilobular emphysema. Minimal motion degradation. Isolated left lower lobe pulmonary nodule of 3.9 mm. Upper Abdomen: Cholecystectomy. Normal imaged portions of the spleen, stomach, pancreas, adrenal glands, kidneys. Musculoskeletal: Midthoracic spondylosis with mild S-shaped spinal curvature. IMPRESSION: 1. Lung-RADS 2, benign appearance or behavior. Continue annual screening with low-dose chest CT without contrast in 12 months. 2. Pulmonary artery enlargement suggests pulmonary arterial hypertension. 3. No change in mild ascending aortic dilatation at 4.1 cm. This can be re-evaluated on routine lung cancer screening follow-up at 12 months. 4.  Emphysema (ICD10-J43.9).  Aortic Atherosclerosis (ICD10-I70.0). Electronically Signed   By: Jeronimo Greaves M.D.   On: 08/19/2023 10:30    LAB RESULTS last 90 days Recent Results (from the past 2160 hour(s))  POCT INR     Status: None   Collection Time: 07/14/23  8:02 AM  Result Value Ref Range   INR 2.4 2.0 - 3.0   POC INR    Lab report - scanned     Status: None    Collection Time: 07/24/23 12:00 AM  Result Value Ref Range   EGFR 57.0     Comment: Non-AA - Abstracted by HIM   A1c 6.3     Comment: Abstracted by HIM         has a past medical history of Acute diastolic heart failure, NYHA class 1 (HCC) (02/08/2017), Acute heart failure (HCC) (02/08/2017), Cataract, CHF (congestive heart failure) (HCC) (03/02/2017), Complication of anesthesia, COPD (chronic obstructive pulmonary disease) (HCC), Depression, Depression, Diverticulitis, DVT (deep venous thrombosis) (HCC), Fibromyalgia, GERD (  gastroesophageal reflux disease), Heart murmur, Hiatal hernia, History of deviated nasal septum, HTN (hypertension), Hyperlipidemia, Hypertension, Hypertensive retinopathy, Myocardial infarction (HCC), Obesity, On supplemental oxygen therapy, Osteoporosis, Osteoporosis, Oxygen deficiency, PAT (paroxysmal atrial tachycardia) (HCC), PFO (patent foramen ovale), PULMONARY NODULE, LEFT LOWER LOBE (10/14/2009), 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   reports that she quit smoking about 13 years ago. Her smoking use included cigarettes. She started smoking about 43 years ago. She has a 90 pack-year smoking history. She has never used smokeless tobacco.  Past Surgical History:  Procedure Laterality Date   ABDOMINAL HYSTERECTOMY N/A    [employer]reesia 11/04/2020   CARDIOVASCULAR STRESS TEST  12/26/2004   EF 74%. NO EVIDENCE OF ISCHEMIA   CATARACT EXTRACTION Left    Dr. Hortense Ramal   ESOPHAGOGASTRODUODENOSCOPY (EGD) WITH PROPOFOL N/A 04/15/2015   Procedure: ESOPHAGOGASTRODUODENOSCOPY (EGD) WITH PROPOFOL;  Surgeon: Carman Ching, MD;  Location: [facility];  Service: Endoscopy;  Laterality: N/A;   EYE SURGERY Left    Cat Sx   JOINT REPLACEMENT N/A    [employer]reesia 11/04/2020   KNEE ARTHROSCOPY  2000   left   LAPAROSCOPIC CHOLECYSTECTOMY  04-16-2010   [facility]   LOWER EXTREMITY  ANGIOGRAPHY N/A 09/09/2017   Procedure: Lower Extremity Angiography;  Surgeon: Runell Gess, MD;  Location: [facility] INVASIVE CV LAB;  Service: Cardiovascular;  Laterality: N/A;   LOWER EXTREMITY INTERVENTION Left 01/17/2018   Procedure: LOWER EXTREMITY INTERVENTION;  Surgeon: Runell Gess, MD;  Location: [facility] INVASIVE CV LAB;  Service: Cardiovascular;  Laterality: Left;   MOUTH SURGERY     03-26-15 multiple extractions stitches remains   PERIPHERAL VASCULAR INTERVENTION Left 01/17/2018   Procedure: PERIPHERAL VASCULAR INTERVENTION;  Surgeon: Runell Gess, MD;  Location: [facility] INVASIVE CV LAB;  Service: Cardiovascular;  Laterality: Left;  COMMON ILIAC   RIGHT/LEFT HEART CATH AND CORONARY ANGIOGRAPHY N/A 03/04/2017   Procedure: Right/Left Heart Cath and Coronary Angiography;  Surgeon: Peter M [place], MD;  [facility] INVASIVE CV LAB;  Service: Cardiovascular;  Laterality: N/A;   TOTAL ABDOMINAL HYSTERECTOMY     post op needed oxygen was told "she gave them a scare"   TUBAL LIGATION     US ECHOCARDIOGRAPHY  11/20/2009   EF 55-60%    Allergies  Allergen Reactions   Alprazolam Anaphylaxis and Other (See Comments)    REACTION: stops breathing   Bee Venom Anaphylaxis   Iodine Anaphylaxis, Swelling and Other (See Comments)    REACTION: swelling in throat   Pseudoephedrine Hcl Er Shortness Of Breath   Budesonide-Formoterol Fumarate Other (See Comments)    Blisters inside of mouth all over   Crestor [Rosuvastatin Calcium] Other (See Comments)    Unable to walk   Esomeprazole Magnesium Other (See Comments)    REACTION: "bouncing off walls"   Flonase [Fluticasone Propionate] Other (See Comments)    NOSE BLEED   Lamictal [Lamotrigine] Rash    Patient got rash, labored breathing, and diarrhea   Loratadine Other (See Comments)    claritin D causes shaking   Lotrimin [Clotrimazole] Other (See Comments)    Mouth blisters   Lunesta [Eszopiclone] Other (See Comments)    REACTION: "slept for a  week"   Oxcarbazepine Other (See Comments)    Causes deep sleep and dizziness   Statins Other (See Comments)    Can't walk, legs won't work    Zolpidem Tartrate Other (See Comments)    REACTION: "slept for  a week"   Betadine [Povidone Iodine] Other (See Comments)    Breathing problems   Bevespi Aerosphere [Glycopyrrolate-Formoterol] Other (See Comments)    Pt believes this caused mouth sores and thrush    Breztri Aerosphere [Budeson-Glycopyrrol-Formoterol] Other (See Comments)    Thrush   Clarithromycin Other (See Comments)    All "mycins", Puts into "a" fib, Will take if has to for severe sinus infection   Effexor [Venlafaxine] Nausea And Vomiting and Other (See Comments)    cramps   Lexapro [Escitalopram Oxalate] Other (See Comments)    hallucinations   Aciphex [Rabeprazole Sodium] Rash   Alendronate Sodium Other (See Comments)    "caused stomach problems for 3 days"   Avelox [Moxifloxacin Hcl In Nacl] Other (See Comments)    Stomach cramps.    Bextra [Valdecoxib] Rash   Ceclor [Cefaclor] Rash   Cephalexin Rash and Other (See Comments)    Pt states that she is possibly allergic to this - had a reaction to Cefaclor in the past and she does not want to these class drugs. Added per patient request.   Covera-Hs [Verapamil Hcl] Palpitations   Dicyclomine Hcl Rash   Estradiol Other (See Comments)    Breast soreness (severe).   Other Other (See Comments)    Glue from ekg/heart monitor leads --rash, Any MYCINS   Tessalon Perles Rash    Immunization History  Administered Date(s) Administered   Influenza Split 08/03/2011, 08/01/2012   Influenza,inj,Quad PF,6+ Mos 08/14/2013, 09/03/2014, 09/04/2015, 08/18/2016   Influenza-Unspecified 11/03/1999   Pneumococcal Conjugate-13 10/01/2014   Pneumococcal Polysaccharide-23 09/03/1999, 06/02/2006, 08/14/2013   Td 11/02/1994   Tdap 08/18/2016    Family History  Problem Relation Age of Onset   Dementia Mother    Diabetes Mother     Alzheimer's disease Mother    Heart attack Brother 35   Heart attack Father    Schizophrenia Sister    Diabetes Sister    Tremor Sister      Current Outpatient Medications:    Alirocumab (PRALUENT) 75 MG/ML SOAJ, INJECT 75 MG SUBCUTANEOUSLY EVERY 14 DAYS, Disp: 2 mL, Rfl: 9   carvedilol (COREG) 12.5 MG tablet, TAKE 1 TABLET BY MOUTH 2 TIMES DAILY., Disp: 60 tablet, Rfl: 11   clopidogrel (PLAVIX) 75 MG tablet, TAKE 1 TABLET BY MOUTH EVERY DAY, Disp: 30 tablet, Rfl: 11   desvenlafaxine (PRISTIQ) 25 MG 24 hr tablet, Take 1 tablet (25 mg total) by mouth daily., Disp: 30 tablet, Rfl: 2   diclofenac sodium (VOLTAREN) 1 % GEL, Apply 2 g topically 4 (four) times daily., Disp: , Rfl: 5   Eyelid Cleansers (OCUSOFT BABY EYELID & EYELASH EX), Apply topically., Disp: , Rfl:    furosemide (LASIX) 40 MG tablet, TAKE 1 TABLET BY MOUTH EVERY DAY, Disp: 30 tablet, Rfl: 11   gabapentin (NEURONTIN) 100 MG capsule, Take 200 mg by mouth See admin instructions. Take 1 in am and 1 in the lunch and 2 at bedtime, Disp: , Rfl:    HYDROcodone-acetaminophen (NORCO/VICODIN) 5-325 MG tablet, Take 1 tablet by mouth in the morning and at bedtime., Disp: , Rfl:    ipratropium (ATROVENT) 0.02 % nebulizer solution, Take 1.25 mLs (0.25 mg total) by nebulization every 4 (four) hours as needed for wheezing or shortness of breath. [name], Disp: 62.5 mL, Rfl: 5   montelukast (SINGULAIR) 10 MG tablet, TAKE 1 TABLET BY MOUTH EVERYDAY AT BEDTIME (Patient taking differently: Take 10 mg by mouth daily.), Disp: 90 tablet, Rfl: 2  nitroGLYCERIN (NITROSTAT) 0.4 MG SL tablet, PLACE 1 TABLET UNDER THE TONGUE EVERY 5 MINUTES AS NEEDED FOR CHEST PAIN., Disp: 25 tablet, Rfl: 3   OXYGEN, Inhale 1.5-3 L into the lungs as needed (for shortness of breath)., Disp: , Rfl:    pantoprazole (PROTONIX) 40 MG tablet, TAKE 1 TABLET BY MOUTH EVERY DAY, Disp: 30 tablet, Rfl: 5   polyethylene glycol (MIRALAX / GLYCOLAX) packet, Take 17 g by mouth  daily as needed for mild constipation. , Disp: , Rfl:    spironolactone (ALDACTONE) 25 MG tablet, TAKE 1 TABLET BY MOUTH EVERY DAY, Disp: 90 tablet, Rfl: 2   traZODone (DESYREL) 100 MG tablet, Take one tab daily as needed for sleep, Disp: 30 tablet, Rfl: 2   warfarin (COUMADIN) 5 MG tablet, TAKE ONE-HALF TO 1 TABLET BY MOUTH DAILY OR AS DIRECTED BY CLINIC, Disp: 30 tablet, Rfl: 3      Objective:   Vitals:   08/20/23 0902  BP: (!) 142/89  Pulse: 81  SpO2: 92%  Weight: 185 lb (83.9 kg)  Height: 5' 5.5" (1.664 m)    Estimated body mass index is 30.32 kg/m as calculated from the following:   Height as of this encounter: 5' 5.5" (1.664 m).   Weight as of this encounter: 185 lb (83.9 kg).  @WEIGHTCHANGE @  [employer]   08/20/23 0902  Weight: 185 lb (83.9 kg)     Physical Exam   General: No distress. Upset and crying O2 at rest: 2L [place] present: no Sitting in wheel chair: no Frail: no Obese: yes Neuro: Alert and Oriented x 3. GCS 15. Speech normal Psych: Pleasant Resp:  Barrel Chest - no.  Wheeze - no, Crackles - no, No overt respiratory distress CVS: Normal heart sounds. Murmurs - no Ext: Stigmata of Connective Tissue Disease - no HEENT: Normal upper airway. PEERL +. No post nasal drip        Assessment:       ICD-10-CM   1. Chronic respiratory failure with hypoxia (HCC)  J96.11     2. 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 Multiple drug allergies  Z88.9     4. Screening for lung cancer  Z12.2          Plan:     Patient Instructions     ICD-10-CM   1. Chronic respiratory failure with hypoxia (HCC)  J96.11     2. 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 Multiple drug allergies  Z88.9     4. Screening for lung cancer  Z12.2         Chronic respiratory failure with hypoxia (HCC) Emphysema  -stable disease. No flare up - lot of emotional stressors now  Plan - Continue ipratropium 3 mL every 6 hours as needed for shortness of breath or wheezing. Use  twice daily for maintenance  - Continue singulair 10 mg At bedtime -  - Continue supplemental oxygen 2-3 lpm with activity and at night. Goal oxygen >88-90%  - Continue Mucinex 600 mg Twice daily for chest congestion/cough   Vaccine counseling Multiple drug allergies  -Respect hesitancy towards COVID, RSV and flu vaccine  Plan - Monitor clinically   Screening for lung cancer  -No evidence of pneumonia on CT scan of the chest October 2024.  No lung nodules no lung cancer.  No pulmonary fibrosis  - -No further CT scans per USPTF [place]) after age 12 for lung cancer screening though [employer] wants you to have yearly scans till age 73  plan - no futher CT scan for this based on shared decision making -Smoking to remain in remission     Follow up -6 months   FOLLOWUP No follow-ups on file.    SIGNATURE    Dr. [name], M.D., F.C.C.P,  Pulmonary and Critical Care Medicine Staff Physician, [facility] Director - Interstitial Lung Disease  Program  P[facility] Fibrosis [facility] [place] [facility] at [facility] [place], [place], [address]  Pager: [phone], If no answer or between  15:00h - 7:00h: call [phone] Telephone: [phone]  9:30 AM 08/20/2023

## 2023-08-25 ENCOUNTER — Ambulatory Visit: Payer: Medicare Other | Attending: Cardiology | Admitting: *Deleted

## 2023-08-25 ENCOUNTER — Other Ambulatory Visit: Payer: Self-pay | Admitting: Pharmacist Clinician (PhC)/ Clinical Pharmacy Specialist

## 2023-08-25 DIAGNOSIS — I480 Paroxysmal atrial fibrillation: Secondary | ICD-10-CM

## 2023-08-25 DIAGNOSIS — Z7901 Long term (current) use of anticoagulants: Secondary | ICD-10-CM

## 2023-08-25 DIAGNOSIS — I2510 Atherosclerotic heart disease of native coronary artery without angina pectoris: Secondary | ICD-10-CM

## 2023-08-25 DIAGNOSIS — E7800 Pure hypercholesterolemia, unspecified: Secondary | ICD-10-CM

## 2023-08-25 LAB — POCT INR: INR: 3.1 — AB (ref 2.0–3.0)

## 2023-08-25 MED ORDER — REPATHA SURECLICK 140 MG/ML ~~LOC~~ SOAJ: 140.0000 mg | SUBCUTANEOUS | Status: DC

## 2023-08-25 NOTE — Telephone Encounter (Signed)
Pt on Praluent, unable to afford.  [employer] grant currently not available.  Will give samples of Repatha 140 mg to help her until January 1.

## 2023-08-25 NOTE — Patient Instructions (Addendum)
Description   Today take 1/2 tablet of warfarin then continue taking 1/2 tablet daily except 1 tablet on Mondays, Wednesdays and Fridays. Recheck INR in 6 weeks.  [facility] [phone]

## 2023-09-01 ENCOUNTER — Telehealth: Payer: Self-pay | Admitting: Pharmacist

## 2023-09-01 ENCOUNTER — Encounter: Payer: Self-pay | Admitting: Cardiology

## 2023-09-01 NOTE — Telephone Encounter (Signed)
Left message for pt to discuss [employer] grant that reopened recently to see if she is still interested. Will also need to see what medication she's taking as she has both Repatha and Praluent currently listed on her med list.

## 2023-09-01 NOTE — Telephone Encounter (Signed)
Spoke to patient she stated her insurance will no longer cover Praluent.She wanted Laurie Horton to know.Advised I will send message to her.

## 2023-09-01 NOTE — Telephone Encounter (Signed)
Disregard if pt calls back - received message from [place]: "We gave her enough Repatha to get through the end of year and she just sent a message saying her [facility] plan won't be covering in 2025.  I asked that she bring the letter to her next INR appointment.  She's on Praluent and has [employer]. I want to see the letter before we sign her up for a grant that may not get used."

## 2023-09-15 ENCOUNTER — Other Ambulatory Visit (HOSPITAL_COMMUNITY): Payer: Self-pay | Admitting: Psychiatry

## 2023-09-15 DIAGNOSIS — F331 Major depressive disorder, recurrent, moderate: Secondary | ICD-10-CM

## 2023-09-22 ENCOUNTER — Telehealth (HOSPITAL_COMMUNITY): Payer: Medicare Other | Admitting: Psychiatry

## 2023-09-23 ENCOUNTER — Ambulatory Visit (HOSPITAL_COMMUNITY): Payer: Medicare Other | Admitting: Psychiatry

## 2023-09-23 ENCOUNTER — Encounter (HOSPITAL_COMMUNITY): Payer: Self-pay | Admitting: Psychiatry

## 2023-09-23 ENCOUNTER — Other Ambulatory Visit: Payer: Self-pay

## 2023-09-23 DIAGNOSIS — F331 Major depressive disorder, recurrent, moderate: Secondary | ICD-10-CM | POA: Diagnosis not present

## 2023-09-23 DIAGNOSIS — F411 Generalized anxiety disorder: Secondary | ICD-10-CM

## 2023-09-23 MED ORDER — DESVENLAFAXINE SUCCINATE ER 25 MG PO TB24: 25.0000 mg | ORAL_TABLET | Freq: Every day | ORAL | 2 refills | Status: DC

## 2023-09-23 MED ORDER — TRAZODONE HCL 100 MG PO TABS: ORAL_TABLET | ORAL | 2 refills | Status: DC

## 2023-09-23 NOTE — Progress Notes (Signed)
BH MD/PA/NP OP Progress Note  Patient location; office Provider location; office  09/23/2023 11:16 AM Laurie Horton  MRN:  [id number]  Chief Complaint:  Chief Complaint  Patient presents with   Follow-up   HPI: Patient came in today for her follow-up appointment.  She had difficulty doing video session.  She reported today pain is increasing her back because she has not taken her pain medicine and it make her dizzy and she cannot drive.  She reported her anxiety is there but stable.  She worried about her older daughter who now facing eviction because she did not pay the rent.  Patient told her daughter also hit the deer while doing door dashand patient has to pay $1800 car.  Patient told that she is upset because she does not pay the rent on time but spending money necessary.  She is taking Pristiq 25 mg that is helping her anxiety.  She could not tolerate 50 mg and started to have nausea, dizziness.  She denies any irritability, hopelessness, anger, mood swings.  She recalled 1 episode of irritability few weeks ago when she was at a shopping store but upset because a customer service was not good.  However she later called the store and apologize for the behavior.  Overall she feels things are going okay.  She sleeps okay.  Her younger daughter who lives hour and a half recently came to visit her.  Patient has planned to have a pizza party at her place so she can see the kids and the grandkids.  Patient denies any mania, hallucination.  She like to keep the current medication which is helping her sleep and anxiety and depression.  Neuro- Visit Diagnosis:    ICD-10-CM   1. MDD (major depressive disorder), recurrent episode, moderate (HCC)  F33.1 desvenlafaxine (PRISTIQ) 25 MG 24 hr tablet    traZODone (DESYREL) 100 MG tablet    2. Generalized anxiety disorder  F41.1 desvenlafaxine (PRISTIQ) 25 MG 24 hr tablet      Past Psychiatric History: Reviewed. H/O depression and anxiety. No H/O  inpatient treatment, suicidal attempt, paranoia, mania and hallucination. Seeing psychiatrist in 2006 after a stroke.  Tried Cymbalta, lexapro and Celexa with limited response.  Tried Ambien, Lunesta, lexapro, Lamictal and Xanax but developed allergies and side effects.  Tried higher Pristiq but has side effects. Valium helped in past.   Past Medical History:  Past Medical History:  Diagnosis Date   Acute diastolic heart failure, NYHA class 1 (HCC) 02/08/2017   Acute heart failure (HCC) 02/08/2017   Cataract    OD   CHF (congestive heart failure) (HCC) 03/02/2017   EF 25-30% 2018   Complication of anesthesia    various issues with oxygen  saturations post op   COPD (chronic obstructive pulmonary disease) (HCC)    Depression    Depression    Phreesia 11/04/2020   Diverticulitis    DVT (deep venous thrombosis) (HCC)    Fibromyalgia    GERD (gastroesophageal reflux disease)    Heart murmur    Phreesia 11/04/2020   Hiatal hernia    History of deviated nasal septum    left- side   HTN (hypertension)    Hyperlipidemia    Hypertension    [employer]reesia 11/04/2020   Hypertensive retinopathy    OU   Myocardial infarction (HCC)    Phreesia 11/04/2020   Obesity    On supplemental oxygen therapy    concentrator at night @ 1.5 l/m or when sleeps.  O2 Sat niormally 87.   Osteoporosis    Osteoporosis    Phreesia 11/04/2020   Oxygen deficiency    [employer]reesia 11/04/2020   PAT (paroxysmal atrial tachycardia) (HCC)    PFO (patent foramen ovale)    PULMONARY NODULE, LEFT LOWER LOBE 10/14/2009   5mm LLL nodule dec 2010. Stable and 4mm in Oct 2012. 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 Persists on cxr 07/22/11. Cleared on CT 08/24/11. No further followup   Stroke [facility])    TOBACCO ABUSE 06/04/2009    Past Surgical History:  Procedure Laterality Date   ABDOMINAL HYSTERECTOMY N/A     [employer]reesia 11/04/2020   CARDIOVASCULAR STRESS TEST  12/26/2004   EF 74%. NO EVIDENCE OF ISCHEMIA   CATARACT EXTRACTION Left    Dr. Hortense Ramal   ESOPHAGOGASTRODUODENOSCOPY (EGD) WITH PROPOFOL N/A 04/15/2015   Procedure: ESOPHAGOGASTRODUODENOSCOPY (EGD) WITH PROPOFOL;  Surgeon: Carman Ching, MD;  Location: [facility] ENDOSCOPY;  Service: Endoscopy;  Laterality: N/A;   EYE SURGERY Left    Cat Sx   JOINT REPLACEMENT N/A    Phreesia 11/04/2020   KNEE ARTHROSCOPY  2000   left   LAPAROSCOPIC CHOLECYSTECTOMY  04-16-2010   [facility]   LOWER EXTREMITY ANGIOGRAPHY N/A 09/09/2017   Procedure: Lower Extremity Angiography;  Surgeon: Runell Gess, MD;  Location[facility] INVASIVE CV LAB;  Service: Cardiovascular;  Laterality: N/A;   LOWER EXTREMITY INTERVENTION Left 01/17/2018   Procedure: LOWER EXTREMITY INTERVENTION;  Surgeon: Runell Gess, MD;  Location: [facility] INVASIVE CV LAB;  Service: Cardiovascular;  Laterality: Left;   MOUTH SURGERY     03-26-15 multiple extractions stitches remains   PERIPHERAL VASCULAR INTERVENTION Left 01/17/2018   Procedure: PERIPHERAL VASCULAR INTERVENTION;  Surgeon: Runell Gess, MD;  Location: [facility] INVASIVE CV LAB;  Service: Cardiovascular;  Laterality: Left;  COMMON ILIAC   RIGHT/LEFT HEART CATH AND CORONARY ANGIOGRAPHY N/A 03/04/2017   Procedure: Right/Left Heart Cath and Coronary Angiography;  Surgeon: Peter M Swaziland, MD;  Location: [facility] INVASIVE CV LAB;  Service: Cardiovascular;  Laterality: N/A;   TOTAL ABDOMINAL HYSTERECTOMY     post op needed oxygen was told "she gave them a scare"   TUBAL LIGATION     US ECHOCARDIOGRAPHY  11/20/2009   EF 55-60%    Family Psychiatric History: Reviewed.  Family History:  Family History  Problem Relation Age of Onset   Dementia Mother    Diabetes Mother    Alzheimer's disease Mother    Heart attack Brother 20   Heart attack Father    Schizophrenia Sister    Diabetes Sister    Tremor Sister     Social History:  Social History    Socioeconomic History   Marital status: Divorced    Spouse name: Not on file   Number of children: 2   Years of education: Not on file   Highest education level: Not on file  Occupational History   Occupation: disability  Tobacco Use   Smoking status: Former    Current packs/day: 0.00    Average packs/day: 3.0 packs/day for 30.0 years (90.0 ttl pk-yrs)    Types: Cigarettes    Start date: 06/02/1980    Quit date: 06/02/2010    Years since quitting: 13.3   Smokeless tobacco: Never   Tobacco comments:    QUIT IN 2011  Vaping Use   Vaping status: Never Used  Substance  and Sexual Activity   Alcohol use: No    Alcohol/week: 0.0 standard drinks of alcohol   Drug use: No   Sexual activity: Yes    Partners: Male    Birth control/protection: Post-menopausal, Surgical  Other Topics Concern   Not on file  Social History Narrative   Marital status: divorced in 1978 after ten years; dating casually in 2019.      Children: 3 biological children; 3 court appointed children; 6 grandchildren; 5 gg      Lives: alone; children in [place] and [place].      Employment: retired age 8; disability for COPD, CVA at age 61.      Tobacco: former smoker; quit smoking 2011.      Alcohol: none      Exercise: walks dog four times per day; goes to pool three times per week.      ADLs: drives; no assistant devices; does have a walker.  Cleaning is limited in 2018.  Daughter helps with cleaning.  Does own grocery shopping.      Advanced Directives: YES; DNR/DNI; HCPOA: [name] Martin/daughter youngest.  Blind.               Social Determinants of Health   Financial Resource Strain: Not on file  Food Insecurity: Not on file  Transportation Needs: Not on file  Physical Activity: Not on file  Stress: Not on file  Social Connections: Not on file    Allergies:  Allergies  Allergen Reactions   Alprazolam Anaphylaxis and Other (See Comments)    REACTION: stops breathing   Bee Venom  Anaphylaxis   Iodine Anaphylaxis, Swelling and Other (See Comments)    REACTION: swelling in throat   Pseudoephedrine Hcl Er Shortness Of Breath   Budesonide-Formoterol Fumarate Other (See Comments)    Blisters inside of mouth all over   Crestor [Rosuvastatin Calcium] Other (See Comments)    Unable to walk   Esomeprazole Magnesium Other (See Comments)    REACTION: "bouncing off walls"   Flonase [Fluticasone Propionate] Other (See Comments)    NOSE BLEED   Lamictal [Lamotrigine] Rash    Patient got rash, labored breathing, and diarrhea   Loratadine Other (See Comments)    claritin D causes shaking   Lotrimin [Clotrimazole] Other (See Comments)    Mouth blisters   Lunesta [Eszopiclone] Other (See Comments)    REACTION: "slept for a week"   Oxcarbazepine Other (See Comments)    Causes deep sleep and dizziness   Statins Other (See Comments)    Can't walk, legs won't work    Zolpidem Tartrate Other (See Comments)    REACTION: "slept for a week"   Betadine [Povidone Iodine] Other (See Comments)    Breathing problems   Bevespi Aerosphere [Glycopyrrolate-Formoterol] Other (See Comments)    Pt believes this caused mouth sores and thrush    Breztri Aerosphere [Budeson-Glycopyrrol-Formoterol] Other (See Comments)    Thrush   Clarithromycin Other (See Comments)    All "mycins", Puts into "a" fib, Will take if has to for severe sinus infection   Effexor [Venlafaxine] Nausea And Vomiting and Other (See Comments)    cramps   Lexapro [Escitalopram Oxalate] Other (See Comments)    hallucinations   Aciphex [Rabeprazole Sodium] Rash   Alendronate Sodium Other (See Comments)    "caused stomach problems for 3 days"   Avelox [Moxifloxacin Hcl In Nacl] Other (See Comments)    Stomach cramps.    Bextra [Valdecoxib] Rash   Ceclor [  Cefaclor] Rash   Cephalexin Rash and Other (See Comments)    Pt states that she is possibly allergic to this - had a reaction to Cefaclor in the past and she does not  want to these class drugs. Added per patient request.   Covera-Hs [Verapamil Hcl] Palpitations   Dicyclomine Hcl Rash   Estradiol Other (See Comments)    Breast soreness (severe).   Other Other (See Comments)    Glue from ekg/heart monitor leads --rash, Any MYCINS   Tessalon Perles Rash    Metabolic Disorder Labs: Lab Results  Component Value Date   HGBA1C 6.4 07/02/2021   MPG 120 (H) 05/04/2013   No results found for: "PROLACTIN" Lab Results  Component Value Date   CHOL 132 07/02/2021   TRIG 220.0 (H) 07/02/2021   HDL 41.80 07/02/2021   CHOLHDL 3 07/02/2021   VLDL 44.0 (H) 07/02/2021   LDLCALC 52 09/17/2020   LDLCALC 116 (H) 05/16/2020   Lab Results  Component Value Date   TSH 3.760 12/25/2019   TSH 2.180 11/13/2019    Therapeutic Level Labs: No results found for: "LITHIUM" No results found for: "VALPROATE" No results found for: "CBMZ"  Current Medications: Current Outpatient Medications  Medication Sig Dispense Refill   Alirocumab (PRALUENT) 75 MG/ML SOAJ INJECT 75 MG SUBCUTANEOUSLY EVERY 14 DAYS 2 mL 9   carvedilol (COREG) 12.5 MG tablet TAKE 1 TABLET BY MOUTH 2 TIMES DAILY. 60 tablet 11   clopidogrel (PLAVIX) 75 MG tablet TAKE 1 TABLET BY MOUTH EVERY DAY 30 tablet 11   desvenlafaxine (PRISTIQ) 25 MG 24 hr tablet Take 1 tablet (25 mg total) by mouth daily. 30 tablet 2   diclofenac sodium (VOLTAREN) 1 % GEL Apply 2 g topically 4 (four) times daily.  5   Evolocumab (REPATHA SURECLICK) 140 MG/ML SOAJ Inject 140 mg into the skin every 14 (fourteen) days.     Eyelid Cleansers (OCUSOFT BABY EYELID & EYELASH EX) Apply topically.     furosemide (LASIX) 40 MG tablet TAKE 1 TABLET BY MOUTH EVERY DAY 30 tablet 11   gabapentin (NEURONTIN) 100 MG capsule Take 200 mg by mouth See admin instructions. Take 1 in am and 1 in the lunch and 2 at bedtime     HYDROcodone-acetaminophen (NORCO/VICODIN) 5-325 MG tablet Take 1 tablet by mouth in the morning and at bedtime.     ipratropium  (ATROVENT) 0.02 % nebulizer solution Take 1.25 mLs (0.25 mg total) by nebulization every 4 (four) hours as needed for wheezing or shortness of breath. [name] 62.5 mL 5   montelukast (SINGULAIR) 10 MG tablet TAKE 1 TABLET BY MOUTH EVERYDAY AT BEDTIME (Patient taking differently: Take 10 mg by mouth daily.) 90 tablet 2   nitroGLYCERIN (NITROSTAT) 0.4 MG SL tablet PLACE 1 TABLET UNDER THE TONGUE EVERY 5 MINUTES AS NEEDED FOR CHEST PAIN. 25 tablet 3   OXYGEN Inhale 1.5-3 L into the lungs as needed (for shortness of breath).     pantoprazole (PROTONIX) 40 MG tablet TAKE 1 TABLET BY MOUTH EVERY DAY 30 tablet 5   polyethylene glycol (MIRALAX / GLYCOLAX) packet Take 17 g by mouth daily as needed for mild constipation.      spironolactone (ALDACTONE) 25 MG tablet TAKE 1 TABLET BY MOUTH EVERY DAY 90 tablet 2   traZODone (DESYREL) 100 MG tablet Take one tab daily as needed for sleep 30 tablet 2   warfarin (COUMADIN) 5 MG tablet TAKE ONE-HALF TO 1 TABLET BY MOUTH DAILY OR AS  DIRECTED BY CLINIC 30 tablet 3   No current facility-administered medications for this visit.     Musculoskeletal: Strength & Muscle Tone: within normal limits Gait & Station: normal Patient leans: N/A  Psychiatric Specialty Exam: Review of Systems  Musculoskeletal:  Positive for back pain.    Blood pressure (!) 140/82, pulse 80, height 5\' 4"  (1.626 m), weight 186 lb (84.4 kg).Body mass index is 31.93 kg/m.  General Appearance: Casual  Eye Contact:  Good  Speech:  Slow  Volume:  Normal  Mood:  Dysphoric  Affect:  Appropriate  Thought Process:  Goal Directed  Orientation:  Full (Time, Place, and Person)  Thought Content: Rumination   Suicidal Thoughts:  No  Homicidal Thoughts:  No  Memory:  Immediate;   Good Recent;   Good Remote;   Fair  Judgement:  Intact  Insight:  Present  Psychomotor Activity:  Normal  Concentration:  Concentration: Fair and Attention Span: Fair  Recall:  Good  Fund of Knowledge: Good   Language: Good  Akathisia:  No  Handed:  Right  AIMS (if indicated): not done  Assets:  Communication Skills Desire for Improvement Housing Resilience Social Support Transportation  ADL's:  Intact  Cognition: WNL  Sleep:  Fair   Screenings: Mini-Mental    Flowsheet Row Office Visit from 01/06/2023 in [facility]  Total Score (max 30 points ) 30      PHQ2-9    Flowsheet Row Office Visit from 07/16/2021 in [facility] Visit from 07[facility]02/2021 in [facility] Visit from 12/30/2020 in Primary Care at [facility] Visit from 11/13/2020 in Primary Care at [facility] Visit from 11/08/2020 in Primary Care at [facility] Total Score 2 2 1 1 1   PHQ-9 Total Score 9 10 -- -- --      Flowsheet Row ED from 12/05/2021 in [facility] at [facility] ED from 08/18/2021 in [facility] Department at [facility] Video Visit from 02/18/2021 in [facility]  C-SSRS RISK CATEGORY No Risk No Risk No Risk        Assessment and Plan: Patient is stable on current dictation.  She has chronic psychosocial issues and family concerns but symptoms are stable.  She does not want to change the medication since it is working okay.  Continue trazodone 100 mg at bedtime, Pristiq 25 mg daily.  She is excited about holiday party her place where she will see her kids and grandkids.  Review medication.  She is on muscle relaxant and narcotic pain medication.  Discussed polypharmacy and interaction with psychotropic medication.  Recommended to call us back if she has any question or any concern.  Follow-up in 3 months  Collaboration of Care: Collaboration of Care: Other provider involved in patient's care AEB notes are available in epic to review  Patient/Guardian was advised Release of Information must be  obtained prior to any record release in order to collaborate their care with an outside provider. Patient/Guardian was advised if they have not already done so to contact the registration department to sign all necessary forms in order for [place] to release information regarding their care.   Consent: Patient/Guardian gives verbal consent for treatment and assignment of benefits for services provided during this visit. Patient/Guardian expressed understanding and agreed to proceed.   I provided 20 minutes face-to-face time during this encounter  Kathi Ludwig T  Yanisa Goodgame, MD 09/23/2023, 11:16 AM

## 2023-09-26 ENCOUNTER — Other Ambulatory Visit: Payer: Self-pay | Admitting: Cardiology

## 2023-09-26 DIAGNOSIS — I480 Paroxysmal atrial fibrillation: Secondary | ICD-10-CM

## 2023-10-02 ENCOUNTER — Encounter: Payer: Self-pay | Admitting: Cardiology

## 2023-10-06 ENCOUNTER — Ambulatory Visit: Payer: Medicare Other | Attending: Cardiovascular Disease | Admitting: *Deleted

## 2023-10-06 DIAGNOSIS — I480 Paroxysmal atrial fibrillation: Secondary | ICD-10-CM | POA: Diagnosis not present

## 2023-10-06 DIAGNOSIS — Z7901 Long term (current) use of anticoagulants: Secondary | ICD-10-CM | POA: Diagnosis not present

## 2023-10-06 LAB — POCT INR: INR: 3.4 — AB (ref 2.0–3.0)

## 2023-10-06 NOTE — Patient Instructions (Signed)
Description   Do not take any warfarin today then START warfarin then continue taking 1/2 tablet daily except 1 tablet on Mondays and Fridays. Recheck INR in 4 weeks.  [facility] [phone]

## 2023-10-07 ENCOUNTER — Other Ambulatory Visit: Payer: Self-pay | Admitting: Cardiology

## 2023-11-04 ENCOUNTER — Ambulatory Visit: Payer: Medicare Other | Attending: Cardiology

## 2023-11-04 ENCOUNTER — Other Ambulatory Visit: Payer: Self-pay

## 2023-11-04 ENCOUNTER — Telehealth: Payer: Self-pay | Admitting: Cardiology

## 2023-11-04 DIAGNOSIS — Z7901 Long term (current) use of anticoagulants: Secondary | ICD-10-CM | POA: Diagnosis not present

## 2023-11-04 DIAGNOSIS — I480 Paroxysmal atrial fibrillation: Secondary | ICD-10-CM

## 2023-11-04 LAB — POCT INR: INR: 2.4 (ref 2.0–3.0)

## 2023-11-04 MED ORDER — REPATHA SURECLICK 140 MG/ML ~~LOC~~ SOAJ: 140.0000 mg | SUBCUTANEOUS | 3 refills | Status: DC

## 2023-11-04 NOTE — Patient Instructions (Signed)
 Description   Continue taking 1/2 tablet daily except 1 tablet on Mondays and Fridays.  Recheck INR in 5 weeks.  [facility] [phone]

## 2023-11-04 NOTE — Telephone Encounter (Signed)
 Pt has requested that her prescription of Repatha be refilled. Please contact pt if any concerns.

## 2023-11-04 NOTE — Telephone Encounter (Signed)
 Pt states she was given samples and now needs a refill of Repatha.

## 2023-11-18 ENCOUNTER — Encounter: Payer: Self-pay | Admitting: Cardiology

## 2023-11-18 DIAGNOSIS — I739 Peripheral vascular disease, unspecified: Secondary | ICD-10-CM

## 2023-11-18 DIAGNOSIS — I2510 Atherosclerotic heart disease of native coronary artery without angina pectoris: Secondary | ICD-10-CM

## 2023-11-18 MED ORDER — REPATHA SURECLICK 140 MG/ML ~~LOC~~ SOAJ: 140.0000 mg | SUBCUTANEOUS | 1 refills | Status: DC

## 2023-11-22 ENCOUNTER — Other Ambulatory Visit (HOSPITAL_COMMUNITY): Payer: Self-pay

## 2023-11-22 ENCOUNTER — Telehealth: Payer: Self-pay | Admitting: Pharmacy Technician

## 2023-11-22 NOTE — Telephone Encounter (Signed)
Pharmacy Patient Advocate Encounter   Received notification from Patient Advice Request messages that prior authorization for repatha is required/requested.   Insurance verification completed.   The patient is insured through [facility] .   Per test claim: PA required; PA submitted to above mentioned insurance via CoverMyMeds Key/confirmation #/EOC [id number] Status is pending

## 2023-11-22 NOTE — Telephone Encounter (Signed)
Pharmacy Patient Advocate Encounter  Received notification from [facility] that Prior Authorization for repatha has been APPROVED from 11/22/23 to 05/21/24. Ran test claim, Copay is $105.00 3 months. This test claim was processed through [facility]- copay amounts may vary at other pharmacies due to pharmacy/plan contracts, or as the patient moves through the different stages of their insurance plan.   PA #/Case ID/Reference #: [id number]

## 2023-11-24 ENCOUNTER — Telehealth: Payer: Self-pay | Admitting: Neurology

## 2023-11-24 NOTE — Telephone Encounter (Signed)
Pt called to verify appt. Pt said received a call this morning appt was 01/04/24 at 9:00am. Informed patient appt is scheduled on 01/17/24 at 8:00am

## 2023-12-15 ENCOUNTER — Telehealth: Payer: Self-pay | Admitting: *Deleted

## 2023-12-15 ENCOUNTER — Ambulatory Visit: Payer: Medicare Other

## 2023-12-15 NOTE — Telephone Encounter (Signed)
Received a voicemail from the patient stating to call her regarding her appt today. Spoke with her and she states she needed to cancel due to the possibility of freezing rain. Rescheduled appt per her availability.

## 2023-12-16 ENCOUNTER — Other Ambulatory Visit (HOSPITAL_COMMUNITY): Payer: Self-pay | Admitting: Psychiatry

## 2023-12-16 DIAGNOSIS — F331 Major depressive disorder, recurrent, moderate: Secondary | ICD-10-CM

## 2023-12-20 ENCOUNTER — Other Ambulatory Visit: Payer: Self-pay | Admitting: Cardiology

## 2023-12-21 ENCOUNTER — Telehealth: Payer: Self-pay | Admitting: *Deleted

## 2023-12-21 NOTE — Telephone Encounter (Signed)
Spoke with pt since office is opening later on 12/22/22 due to inclement weather. Was able to get her rescheduled to next available date.

## 2023-12-23 ENCOUNTER — Ambulatory Visit: Payer: Medicare Other

## 2023-12-30 ENCOUNTER — Encounter (HOSPITAL_COMMUNITY): Payer: Self-pay | Admitting: Psychiatry

## 2023-12-30 ENCOUNTER — Ambulatory Visit (HOSPITAL_BASED_OUTPATIENT_CLINIC_OR_DEPARTMENT_OTHER): Payer: Medicare Other | Admitting: Psychiatry

## 2023-12-30 DIAGNOSIS — F331 Major depressive disorder, recurrent, moderate: Secondary | ICD-10-CM

## 2023-12-30 DIAGNOSIS — F411 Generalized anxiety disorder: Secondary | ICD-10-CM

## 2023-12-30 MED ORDER — TRAZODONE HCL 100 MG PO TABS: ORAL_TABLET | ORAL | 2 refills | Status: DC

## 2023-12-30 MED ORDER — DESVENLAFAXINE SUCCINATE ER 25 MG PO TB24: 25.0000 mg | ORAL_TABLET | Freq: Every day | ORAL | 2 refills | Status: DC

## 2023-12-30 NOTE — Progress Notes (Signed)
 [facility] MD/PA/NP OP Progress Note  Patient Location: Office Provider Location: Office  12/30/2023 8:20 AM Laurie Horton  MRN:  [id number]  Chief Complaint:  Chief Complaint  Patient presents with   Follow-up   Medication Refill   Anxiety   HPI: Patient came today for her follow-up appointment.  She reported a lot of stress lately.  Her 78 year old grandson married week ago and she was not invited.  Patient told he married to a 78 year old and this is his second marriage.  Patient told she has 2 kids from his first marriage who cheated on him.  Patient became very upset with her daughter for not getting invitation.  Patient reported she went into A-fib due to stress but did not contact the daughter.  She is taking all her medication as prescribed.  Patient has a lot of health issues and recently it is worsening.  She has right vision impairment, chronic neuropathy, back pain and shortness of breath.  She uses nasal oxygen.  She has a history of dizziness.  She eventually passed her DMV exam and able to drive short distance.  She had limited support system as she lives by herself with her cats but does talk to her neighbors on a regular basis.  She has appointment coming up soon to see her primary care Dr. Nicholos Johns.  She try to watch her calorie intake.  Her last hemoglobin A1c 6.4 and she included fish in her diet on a regular basis.  She is taking trazodone and Pristiq.  She sleeps okay but sometime wake up due to shortness of breath.  Sometimes he does not feel current medicine working for her shortness of breath but already taking too many medication and afraid to take more.  She denies any aggression, violence, suicidal thoughts or crying spells.  However she admitted getting frustrated and irritable on her family situation.  She was very sad and dysphoric because no one called on her birthday.  She denies drinking or using any illegal substances.  She had cut down her gabapentin because of  dizziness.. she has no tremors or other concern with meds. Her appetite is ok.  Patient denies any panic attack.  Visit Diagnosis:    ICD-10-CM   1. MDD (major depressive disorder), recurrent episode, moderate (HCC)  F33.1 desvenlafaxine (PRISTIQ) 25 MG 24 hr tablet    traZODone (DESYREL) 100 MG tablet    2. Generalized anxiety disorder  F41.1 desvenlafaxine (PRISTIQ) 25 MG 24 hr tablet      Past Psychiatric History: Reviewed  H/O depression and anxiety. No H/O inpatient treatment, suicidal attempt, paranoia, mania and hallucination. Seeing psychiatrist in 2006 after a stroke.  Tried Cymbalta, lexapro and Celexa with limited response.  Tried Ambien, Lunesta, lexapro, Lamictal and Xanax but developed allergies and side effects.  Tried higher Pristiq but has side effects. Valium helped in past.   Past Medical History:  Past Medical History:  Diagnosis Date   Acute diastolic heart failure, NYHA class 1 (HCC) 02/08/2017   Acute heart failure (HCC) 02/08/2017   Cataract    OD   CHF (congestive heart failure) (HCC) 03/02/2017   EF 25-30% 2018   Complication of anesthesia    various issues with oxygen  saturations post op   COPD (chronic obstructive pulmonary disease) (HCC)    Depression    Depression    [employer]reesia 11/04/2020   Diverticulitis    DVT (deep venous thrombosis) (HCC)    Fibromyalgia    GERD (gastroesophageal reflux  disease)    Heart murmur    [employer]reesia 11/04/2020   Hiatal hernia    History of deviated nasal septum    left- side   HTN (hypertension)    Hyperlipidemia    Hypertension    [employer]reesia 11/04/2020   Hypertensive retinopathy    OU   Myocardial infarction [facility])    Phreesia 11/04/2020   Obesity    On supplemental oxygen therapy    concentrator at night @ 1.5 l/m or when sleeps. O2 Sat niormally 87.   Osteoporosis    Osteoporosis    Phreesia 11/04/2020   Oxygen deficiency    [employer]reesia 11/04/2020   PAT (paroxysmal atrial tachycardia) (HCC)    PFO (patent foramen  ovale)    PULMONARY NODULE, LEFT LOWER LOBE 10/14/2009   5mm LLL nodule dec 2010. Stable and 4mm in Oct 2012. 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 Persists on cxr 07/22/11. Cleared on CT 08/24/11. No further followup   Stroke [facility])    TOBACCO ABUSE 06/04/2009    Past Surgical History:  Procedure Laterality Date   ABDOMINAL HYSTERECTOMY N/A    [employer]reesia 11/04/2020   CARDIOVASCULAR STRESS TEST  12/26/2004   EF 74%. NO EVIDENCE OF ISCHEMIA   CATARACT EXTRACTION Left    Dr. Hortense Ramal   ESOPHAGOGASTRODUODENOSCOPY (EGD) WITH PROPOFOL N/A 04/15/2015   Procedure: ESOPHAGOGASTRODUODENOSCOPY (EGD) WITH PROPOFOL;  Surgeon: Carman Ching, MD;  Location: [facility];  Service: Endoscopy;  Laterality: N/A;   EYE SURGERY Left    Cat Sx   JOINT REPLACEMENT N/A    [employer]reesia 11/04/2020   KNEE ARTHROSCOPY  2000   left   LAPAROSCOPIC CHOLECYSTECTOMY  04-16-2010   [facility]   LOWER EXTREMITY ANGIOGRAPHY N/A 09/09/2017   Procedure: Lower Extremity Angiography;  Surgeon: Runell Gess, MD;  Location: [facility] INVASIVE CV LAB;  Service: Cardiovascular;  Laterality: N/A;   LOWER EXTREMITY INTERVENTION Left 01/17/2018   Procedure: LOWER EXTREMITY INTERVENTION;  Surgeon: Runell Gess, MD;  Location: [facility] INVASIVE CV LAB;  Service: Cardiovascular;  Laterality: Left;   MOUTH SURGERY     03-26-15 multiple extractions stitches remains   PERIPHERAL VASCULAR INTERVENTION Left 01/17/2018   Procedure: PERIPHERAL VASCULAR INTERVENTION;  Surgeon: Runell Gess, MD;  Location: [facility] INVASIVE CV LAB;  Service: Cardiovascular;  Laterality: Left;  COMMON ILIAC   RIGHT/LEFT HEART CATH AND CORONARY ANGIOGRAPHY N/A 03/04/2017   Procedure: Right/Left Heart Cath and Coronary Angiography;  Surgeon: Peter M Swaziland, MD;  Location: [facility] INVASIVE CV LAB;  Service: Cardiovascular;  Laterality: N/A;   TOTAL ABDOMINAL HYSTERECTOMY      post op needed oxygen was told "she gave them a scare"   TUBAL LIGATION     US ECHOCARDIOGRAPHY  11/20/2009   EF 55-60%    Family Psychiatric History: Reviewed  Family History:  Family History  Problem Relation Age of Onset   Dementia Mother    Diabetes Mother    Alzheimer's disease Mother    Heart attack Brother 46   Heart attack Father    Schizophrenia Sister    Diabetes Sister    Tremor Sister     Social History:  Social History   Socioeconomic History   Marital status: Divorced    Spouse name: Not on file   Number of children: 2   Years of education: Not on file   Highest education level: Not on  file  Occupational History   Occupation: disability  Tobacco Use   Smoking status: Former    Current packs/day: 0.00    Average packs/day: 3.0 packs/day for 30.0 years (90.0 ttl pk-yrs)    Types: Cigarettes    Start date: 06/02/1980    Quit date: 06/02/2010    Years since quitting: 13.5   Smokeless tobacco: Never   Tobacco comments:    QUIT IN 2011  Vaping Use   Vaping status: Never Used  Substance and Sexual Activity   Alcohol use: No    Alcohol/week: 0.0 standard drinks of alcohol   Drug use: No   Sexual activity: Yes    Partners: Male    Birth control/protection: Post-menopausal, Surgical  Other Topics Concern   Not on file  Social History Narrative   Marital status: divorced in 1978 after ten years; dating casually in 2019.      Children: 3 biological children; 3 court appointed children; 6 grandchildren; 5 gg      Lives: alone; children in [place] and [place].      Employment: retired age 88; disability for COPD, CVA at age 41.      Tobacco: former smoker; quit smoking 2011.      Alcohol: none      Exercise: walks dog four times per day; goes to pool three times per week.      ADLs: drives; no assistant devices; does have a walker.  Cleaning is limited in 2018.  Daughter helps with cleaning.  Does own grocery shopping.      Advanced Directives: YES;  DNR/DNI; HCPOA: [name] Martin/daughter youngest.  Blind.               Social Drivers of Corporate investment banker Strain: Not on file  Food Insecurity: Not on file  Transportation Needs: Not on file  Physical Activity: Not on file  Stress: Not on file  Social Connections: Not on file    Allergies:  Allergies  Allergen Reactions   Alprazolam Anaphylaxis and Other (See Comments)    REACTION: stops breathing   Bee Venom Anaphylaxis   Iodine Anaphylaxis, Swelling and Other (See Comments)    REACTION: swelling in throat   Pseudoephedrine H[facility] Er Shortness Of Breath   Budesonide-Formoterol Fumarate Other (See Comments)    Blisters inside of mouth all over   Crestor [Rosuvastatin Calcium] Other (See Comments)    Unable to walk   Esomeprazole Magnesium Other (See Comments)    REACTION: "bouncing off walls"   Flonase [Fluticasone Propionate] Other (See Comments)    NOSE BLEED   Lamictal [Lamotrigine] Rash    Patient got rash, labored breathing, and diarrhea   Loratadine Other (See Comments)    claritin D causes shaking   Lotrimin [Clotrimazole] Other (See Comments)    Mouth blisters   Lunesta [Eszopiclone] Other (See Comments)    REACTION: "slept for a week"   Oxcarbazepine Other (See Comments)    Causes deep sleep and dizziness   Statins Other (See Comments)    Can't walk, legs won't work    Zolpidem Tartrate Other (See Comments)    REACTION: "slept for a week"   Betadine [Povidone Iodine] Other (See Comments)    Breathing problems   Bevespi Aerosphere [Glycopyrrolate-Formoterol] Other (See Comments)    Pt believes this caused mouth sores and thrush    Breztri Aerosphere [Budeson-Glycopyrrol-Formoterol] Other (See Comments)    Thrush   Clarithromycin Other (See Comments)    All "mycins", Puts  into "a" fib, Will take if has to for severe sinus infection   Effexor [Venlafaxine] Nausea And Vomiting and Other (See Comments)    cramps   Lexapro [Escitalopram  Oxalate] Other (See Comments)    hallucinations   Aciphex [Rabeprazole Sodium] Rash   Alendronate Sodium Other (See Comments)    "caused stomach problems for 3 days"   Avelox [Moxifloxacin Hcl In Nacl] Other (See Comments)    Stomach cramps.    Bextra [Valdecoxib] Rash   Ceclor [Cefaclor] Rash   Cephalexin Rash and Other (See Comments)    Pt states that she is possibly allergic to this - had a reaction to Cefaclor in the past and she does not want to these class drugs. Added per patient request.   Covera-Hs [Verapamil Hcl] Palpitations   Dicyclomine Hcl Rash   Estradiol Other (See Comments)    Breast soreness (severe).   Other Other (See Comments)    Glue from ekg/heart monitor leads --rash, Any MYCINS   Tessalon Perles Rash    Metabolic Disorder Labs: Lab Results  Component Value Date   HGBA1C 6.4 07/02/2021   MPG 120 (H) 05/04/2013   No results found for: "PROLACTIN" Lab Results  Component Value Date   CHOL 132 07/02/2021   TRIG 220.0 (H) 07/02/2021   HDL 41.80 07/02/2021   CHOLHDL 3 07/02/2021   VLDL 44.0 (H) 07/02/2021   LDLCALC 52 09/17/2020   LDLCALC 116 (H) 05/16/2020   Lab Results  Component Value Date   TSH 3.760 12/25/2019   TSH 2.180 11/13/2019    Therapeutic Level Labs: No results found for: "LITHIUM" No results found for: "VALPROATE" No results found for: "CBMZ"  Current Medications: Current Outpatient Medications  Medication Sig Dispense Refill   carvedilol (COREG) 12.5 MG tablet TAKE 1 TABLET BY MOUTH 2 TIMES DAILY. 60 tablet 11   clopidogrel (PLAVIX) 75 MG tablet TAKE 1 TABLET BY MOUTH EVERY DAY 30 tablet 11   desvenlafaxine (PRISTIQ) 25 MG 24 hr tablet Take 1 tablet (25 mg total) by mouth daily. 30 tablet 2   Evolocumab (REPATHA SURECLICK) 140 MG/ML SOAJ Inject 140 mg into the skin every 14 (fourteen) days. 6 mL 1   Eyelid Cleansers (OCUSOFT BABY EYELID & EYELASH EX) Apply topically.     furosemide (LASIX) 40 MG tablet TAKE 1 TABLET BY MOUTH  EVERY DAY 90 tablet 2   gabapentin (NEURONTIN) 100 MG capsule Take 200 mg by mouth See admin instructions. Take 1 in am and 1 in the lunch and 2 at bedtime     HYDROcodone-acetaminophen (NORCO/VICODIN) 5-325 MG tablet Take 1 tablet by mouth in the morning and at bedtime.     ipratropium (ATROVENT) 0.02 % nebulizer solution Take 1.25 mLs (0.25 mg total) by nebulization every 4 (four) hours as needed for wheezing or shortness of breath. [name] 62.5 mL 5   montelukast (SINGULAIR) 10 MG tablet TAKE 1 TABLET BY MOUTH EVERYDAY AT BEDTIME (Patient taking differently: Take 10 mg by mouth daily.) 90 tablet 2   nitroGLYCERIN (NITROSTAT) 0.4 MG SL tablet PLACE 1 TABLET UNDER THE TONGUE EVERY 5 MINUTES AS NEEDED FOR CHEST PAIN. 25 tablet 7   OXYGEN Inhale 1.5-3 L into the lungs as needed (for shortness of breath).     pantoprazole (PROTONIX) 40 MG tablet TAKE 1 TABLET BY MOUTH EVERY DAY 30 tablet 5   polyethylene glycol (MIRALAX / GLYCOLAX) packet Take 17 g by mouth daily as needed for mild constipation.  spironolactone (ALDACTONE) 25 MG tablet TAKE 1 TABLET BY MOUTH EVERY DAY 90 tablet 2   traZODone (DESYREL) 100 MG tablet Take one tab daily as needed for sleep 30 tablet 2   warfarin (COUMADIN) 5 MG tablet TAKE ONE-HALF TO 1 TABLET BY MOUTH DAILY OR AS DIRECTED BY CLINIC 30 tablet 3   No current facility-administered medications for this visit.     Musculoskeletal: Strength & Muscle Tone: within normal limits Gait & Station: normal Patient leans: N/A  Psychiatric Specialty Exam: Review of Systems  Eyes:        Right vision impairment  Musculoskeletal:  Positive for back pain.  Neurological:  Positive for numbness.    Blood pressure (!) 132/90, pulse 71, height 5\' 3"  (1.6 m), weight 186 lb 6.4 oz (84.6 kg).There is no height or weight on file to calculate BMI.  General Appearance: Casual and on nasal oxygen  Eye Contact:  Good  Speech:  Slow  Volume:  Decreased  Mood:  Dysphoric   Affect:  Constricted  Thought Process:  Descriptions of Associations: Intact  Orientation:  Full (Time, Place, and Person)  Thought Content: Rumination   Suicidal Thoughts:  No  Homicidal Thoughts:  No  Memory:  Immediate;   Good Recent;   Good Remote;   Fair  Judgement:  Intact  Insight:  Present  Psychomotor Activity:  Decreased  Concentration:  Concentration: Fair and Attention Span: Fair  Recall:  Good  Fund of Knowledge: Good  Language: Good  Akathisia:  No  Handed:  Right  AIMS (if indicated): not done  Assets:  Communication Skills Desire for Improvement Housing Transportation  ADL's:  Intact  Cognition: WNL  Sleep:  Fair   Screenings: Mini-Mental    Flowsheet Row Office Visit from 01/06/2023 in [facility]  Total Score (max 30 points ) 30      PHQ2-9    Flowsheet Row Office Visit from 12/30/2023 in [facility] Office Visit from 07/16/2021 in [facility] Visit from 07[facility]02/2021 in [facility] Visit from 12/30/2020 in Primary Care at [facility] Visit from 11/13/2020 in Primary Care at [facility] Total Score 2 2 2 1 1   PHQ-9 Total Score 5 9 10  -- --      Flowsheet Row ED from 12/05/2021 in [facility] Emergency Department at [facility] ED from 08/18/2021 in [facility] Emergency Department at [facility] Video Visit from 02/18/2021 in [facility]  C-SSRS RISK CATEGORY No Risk No Risk No Risk        Assessment and Plan: Patient is 78 year old female with multiple health issues including hypertension, A-fib, chronic systolic heart failure, COPD, degenerative joint disease with neuropathy.  She is concerned about her general physical health which is slowly gradually declining.  She also upset on her family situation.  She is taking Pristiq 25 mg  and trazodone which she believe working but sometimes stress level too much.  PHQ screening done.  I had offered therapy in the past and again today but patient refused.  She feel talking to the neighbor is helpful.  She does watch television when she get bored.  So far she has no major concern from the medication.  She denies any major panic attack.  She denies any suicidal thoughts.  She like to keep the current medication.  I reviewed blood work results which was  done in September.  Hemoglobin A1c 6.4.  Will keep the current medication and dosage.  Discussed polypharmacy as patient is taking muscle relaxant and narcotic pain medication.  Recommend to call us back if she has any question or any concern.  Follow-up in 3 months.  Collaboration of Care: Collaboration of Care: Other provider involved in patient's care AEB notes are available in epic to review  Patient/Guardian was advised Release of Information must be obtained prior to any record release in order to collaborate their care with an outside provider. Patient/Guardian was advised if they have not already done so to contact the registration department to sign all necessary forms in order for [place] to release information regarding their care.   Consent: Patient/Guardian gives verbal consent for treatment and assignment of benefits for services provided during this visit. Patient/Guardian expressed understanding and agreed to proceed.   I provided 31 minutes face to face time during this encounter.   Cleotis Nipper, MD 12/30/2023, 8:20 AM

## 2023-12-31 ENCOUNTER — Ambulatory Visit: Payer: Medicare Other | Attending: Cardiology | Admitting: *Deleted

## 2023-12-31 DIAGNOSIS — I480 Paroxysmal atrial fibrillation: Secondary | ICD-10-CM | POA: Diagnosis not present

## 2023-12-31 DIAGNOSIS — Z7901 Long term (current) use of anticoagulants: Secondary | ICD-10-CM | POA: Diagnosis not present

## 2023-12-31 LAB — POCT INR: INR: 2.3 (ref 2.0–3.0)

## 2023-12-31 NOTE — Patient Instructions (Addendum)
 Description   Continue taking 1/2 tablet daily except 1 tablet on Mondays and Fridays.  Recheck INR in 6 weeks.  [facility] [phone]

## 2024-01-01 ENCOUNTER — Other Ambulatory Visit (HOSPITAL_COMMUNITY)

## 2024-01-01 ENCOUNTER — Other Ambulatory Visit: Payer: Self-pay

## 2024-01-01 ENCOUNTER — Emergency Department (HOSPITAL_COMMUNITY)

## 2024-01-01 ENCOUNTER — Observation Stay (HOSPITAL_COMMUNITY)
Admission: EM | Admit: 2024-01-01 | Discharge: 2024-01-03 | Disposition: A | Discharge status: Home / Self Care | Attending: Family Medicine | Admitting: Family Medicine

## 2024-01-01 DIAGNOSIS — Z79899 Other long term (current) drug therapy: Secondary | ICD-10-CM | POA: Diagnosis not present

## 2024-01-01 DIAGNOSIS — R0602 Shortness of breath: Secondary | ICD-10-CM | POA: Diagnosis present

## 2024-01-01 DIAGNOSIS — I480 Paroxysmal atrial fibrillation: Principal | ICD-10-CM | POA: Diagnosis present

## 2024-01-01 DIAGNOSIS — I25118 Atherosclerotic heart disease of native coronary artery with other forms of angina pectoris: Secondary | ICD-10-CM | POA: Insufficient documentation

## 2024-01-01 DIAGNOSIS — Z7901 Long term (current) use of anticoagulants: Secondary | ICD-10-CM | POA: Diagnosis not present

## 2024-01-01 DIAGNOSIS — J9611 Chronic respiratory failure with hypoxia: Secondary | ICD-10-CM | POA: Insufficient documentation

## 2024-01-01 DIAGNOSIS — Z8673 Personal history of transient ischemic attack (TIA), and cerebral infarction without residual deficits: Secondary | ICD-10-CM | POA: Diagnosis not present

## 2024-01-01 DIAGNOSIS — I10 Essential (primary) hypertension: Secondary | ICD-10-CM | POA: Diagnosis present

## 2024-01-01 DIAGNOSIS — I5033 Acute on chronic diastolic (congestive) heart failure: Secondary | ICD-10-CM | POA: Insufficient documentation

## 2024-01-01 DIAGNOSIS — F419 Anxiety disorder, unspecified: Secondary | ICD-10-CM | POA: Insufficient documentation

## 2024-01-01 DIAGNOSIS — F411 Generalized anxiety disorder: Secondary | ICD-10-CM | POA: Diagnosis present

## 2024-01-01 DIAGNOSIS — R079 Chest pain, unspecified: Secondary | ICD-10-CM | POA: Diagnosis present

## 2024-01-01 DIAGNOSIS — J449 Chronic obstructive pulmonary disease, unspecified: Secondary | ICD-10-CM | POA: Insufficient documentation

## 2024-01-01 DIAGNOSIS — Z87891 Personal history of nicotine dependence: Secondary | ICD-10-CM | POA: Diagnosis not present

## 2024-01-01 DIAGNOSIS — E7800 Pure hypercholesterolemia, unspecified: Secondary | ICD-10-CM | POA: Diagnosis not present

## 2024-01-01 DIAGNOSIS — E785 Hyperlipidemia, unspecified: Secondary | ICD-10-CM | POA: Insufficient documentation

## 2024-01-01 DIAGNOSIS — I110 Hypertensive heart disease with heart failure: Secondary | ICD-10-CM | POA: Diagnosis not present

## 2024-01-01 DIAGNOSIS — Z7902 Long term (current) use of antithrombotics/antiplatelets: Secondary | ICD-10-CM | POA: Insufficient documentation

## 2024-01-01 DIAGNOSIS — I5032 Chronic diastolic (congestive) heart failure: Secondary | ICD-10-CM | POA: Diagnosis not present

## 2024-01-01 DIAGNOSIS — Z1152 Encounter for screening for COVID-19: Secondary | ICD-10-CM | POA: Insufficient documentation

## 2024-01-01 DIAGNOSIS — I4891 Unspecified atrial fibrillation: Principal | ICD-10-CM

## 2024-01-01 DIAGNOSIS — Z86718 Personal history of other venous thrombosis and embolism: Secondary | ICD-10-CM | POA: Diagnosis not present

## 2024-01-01 DIAGNOSIS — I5030 Unspecified diastolic (congestive) heart failure: Secondary | ICD-10-CM | POA: Diagnosis present

## 2024-01-01 LAB — CBC WITH DIFFERENTIAL/PLATELET
Abs Immature Granulocytes: 0.04 10*3/uL (ref 0.00–0.07)
Basophils Absolute: 0 10*3/uL (ref 0.0–0.1)
Basophils Relative: 0 %
Eosinophils Absolute: 0.1 10*3/uL (ref 0.0–0.5)
Eosinophils Relative: 2 %
HCT: 42.6 % (ref 36.0–46.0)
Hemoglobin: 14.1 g/dL (ref 12.0–15.0)
Immature Granulocytes: 1 %
Lymphocytes Relative: 15 %
Lymphs Abs: 1.3 10*3/uL (ref 0.7–4.0)
MCH: 30.7 pg (ref 26.0–34.0)
MCHC: 33.1 g/dL (ref 30.0–36.0)
MCV: 92.8 fL (ref 80.0–100.0)
Monocytes Absolute: 0.9 10*3/uL (ref 0.1–1.0)
Monocytes Relative: 10 %
Neutro Abs: 6.1 10*3/uL (ref 1.7–7.7)
Neutrophils Relative %: 72 %
Platelets: 238 10*3/uL (ref 150–400)
RBC: 4.59 MIL/uL (ref 3.87–5.11)
RDW: 13.2 % (ref 11.5–15.5)
WBC: 8.5 10*3/uL (ref 4.0–10.5)
nRBC: 0 % (ref 0.0–0.2)

## 2024-01-01 LAB — TROPONIN I (HIGH SENSITIVITY)
Troponin I (High Sensitivity): 9 ng/L (ref <18)
Troponin I (High Sensitivity): 9 ng/L (ref <18)

## 2024-01-01 LAB — PROTIME-INR
INR: 2.2 — ABNORMAL HIGH (ref 0.8–1.2)
Prothrombin Time: 24.9 s — ABNORMAL HIGH (ref 11.4–15.2)

## 2024-01-01 LAB — RESP PANEL BY RT-PCR (RSV, FLU A&B, COVID)  RVPGX2
Influenza A by PCR: NEGATIVE
Influenza B by PCR: NEGATIVE
Resp Syncytial Virus by PCR: NEGATIVE
SARS Coronavirus 2 by RT PCR: NEGATIVE

## 2024-01-01 LAB — BASIC METABOLIC PANEL
Anion gap: 13 (ref 5–15)
BUN: 13 mg/dL (ref 8–23)
CO2: 27 mmol/L (ref 22–32)
Calcium: 10 mg/dL (ref 8.9–10.3)
Chloride: 100 mmol/L (ref 98–111)
Creatinine, Ser: 0.94 mg/dL (ref 0.44–1.00)
GFR, Estimated: >60 mL/min (ref >60)
Glucose, Bld: 128 mg/dL — ABNORMAL HIGH (ref 70–99)
Potassium: 4.1 mmol/L (ref 3.5–5.1)
Sodium: 140 mmol/L (ref 135–145)

## 2024-01-01 LAB — MAGNESIUM: Magnesium: 1.8 mg/dL (ref 1.7–2.4)

## 2024-01-01 LAB — TSH: TSH: 1.394 u[IU]/mL (ref 0.350–4.500)

## 2024-01-01 LAB — BRAIN NATRIURETIC PEPTIDE: B Natriuretic Peptide: 219.3 pg/mL — ABNORMAL HIGH (ref 0.0–100.0)

## 2024-01-01 MED ORDER — HYDROCODONE-ACETAMINOPHEN 5-325 MG PO TABS
1.0000 | ORAL_TABLET | Freq: Two times a day (BID) | ORAL | Status: DC
  Administered 2024-01-01 – 2024-01-03 (×4): 1 via ORAL
  Filled 2024-01-01 (×4): qty 1

## 2024-01-01 MED ORDER — ALBUTEROL SULFATE (2.5 MG/3ML) 0.083% IN NEBU: 2.5000 mg | INHALATION_SOLUTION | Freq: Four times a day (QID) | RESPIRATORY_TRACT | Status: DC | PRN

## 2024-01-01 MED ORDER — MONTELUKAST SODIUM 10 MG PO TABS
10.0000 mg | ORAL_TABLET | Freq: Every day | ORAL | Status: DC
  Administered 2024-01-02 – 2024-01-03 (×2): 10 mg via ORAL
  Filled 2024-01-01 (×2): qty 1

## 2024-01-01 MED ORDER — DESVENLAFAXINE SUCCINATE ER 25 MG PO TB24
25.0000 mg | ORAL_TABLET | Freq: Every day | ORAL | Status: DC
  Administered 2024-01-01 – 2024-01-02 (×2): 25 mg via ORAL
  Filled 2024-01-01 (×4): qty 1

## 2024-01-01 MED ORDER — DILTIAZEM LOAD VIA INFUSION
10.0000 mg | Freq: Once | INTRAVENOUS | Status: AC
  Administered 2024-01-01: 10 mg via INTRAVENOUS
  Filled 2024-01-01: qty 10

## 2024-01-01 MED ORDER — SPIRONOLACTONE 25 MG PO TABS
25.0000 mg | ORAL_TABLET | Freq: Every day | ORAL | Status: DC
  Administered 2024-01-02: 25 mg via ORAL
  Filled 2024-01-01 (×2): qty 1

## 2024-01-01 MED ORDER — VENLAFAXINE HCL ER 37.5 MG PO CP24: 37.5000 mg | ORAL_CAPSULE | Freq: Every day | ORAL | Status: DC

## 2024-01-01 MED ORDER — TRAZODONE HCL 100 MG PO TABS
100.0000 mg | ORAL_TABLET | Freq: Every day | ORAL | Status: DC
  Administered 2024-01-01 – 2024-01-02 (×2): 100 mg via ORAL
  Filled 2024-01-01 (×2): qty 1

## 2024-01-01 MED ORDER — FUROSEMIDE 40 MG PO TABS
40.0000 mg | ORAL_TABLET | Freq: Every day | ORAL | Status: DC
  Administered 2024-01-02 – 2024-01-03 (×2): 40 mg via ORAL
  Filled 2024-01-01 (×2): qty 1

## 2024-01-01 MED ORDER — GABAPENTIN 100 MG PO CAPS: 200.0000 mg | ORAL_CAPSULE | Freq: Three times a day (TID) | ORAL | Status: DC | PRN

## 2024-01-01 MED ORDER — PANTOPRAZOLE SODIUM 40 MG PO TBEC
40.0000 mg | DELAYED_RELEASE_TABLET | Freq: Every day | ORAL | Status: DC
  Administered 2024-01-02 – 2024-01-03 (×2): 40 mg via ORAL
  Filled 2024-01-01 (×2): qty 1

## 2024-01-01 MED ORDER — WARFARIN SODIUM 2.5 MG PO TABS
2.5000 mg | ORAL_TABLET | ORAL | Status: DC
  Administered 2024-01-01 – 2024-01-02 (×2): 2.5 mg via ORAL
  Filled 2024-01-01 (×2): qty 1

## 2024-01-01 MED ORDER — MORPHINE SULFATE (PF) 2 MG/ML IV SOLN
2.0000 mg | Freq: Once | INTRAVENOUS | Status: AC
  Administered 2024-01-01: 2 mg via INTRAVENOUS
  Filled 2024-01-01: qty 1

## 2024-01-01 MED ORDER — ONDANSETRON HCL 4 MG/2ML IJ SOLN
4.0000 mg | Freq: Once | INTRAMUSCULAR | Status: AC
  Administered 2024-01-01: 4 mg via INTRAVENOUS
  Filled 2024-01-01: qty 2

## 2024-01-01 MED ORDER — SODIUM CHLORIDE 0.9% FLUSH
3.0000 mL | Freq: Two times a day (BID) | INTRAVENOUS | Status: DC
  Administered 2024-01-02: 3 mL via INTRAVENOUS

## 2024-01-01 MED ORDER — DILTIAZEM HCL-DEXTROSE 125-5 MG/125ML-% IV SOLN (PREMIX)
5.0000 mg/h | INTRAVENOUS | Status: DC
  Administered 2024-01-01: 5 mg/h via INTRAVENOUS
  Filled 2024-01-01: qty 125

## 2024-01-01 MED ORDER — WARFARIN - PHARMACIST DOSING INPATIENT: Freq: Every day | Status: DC

## 2024-01-01 MED ORDER — ACETAMINOPHEN 650 MG RE SUPP: 650.0000 mg | Freq: Four times a day (QID) | RECTAL | Status: DC | PRN

## 2024-01-01 MED ORDER — CLOPIDOGREL BISULFATE 75 MG PO TABS
75.0000 mg | ORAL_TABLET | Freq: Every day | ORAL | Status: DC
  Administered 2024-01-01 – 2024-01-03 (×3): 75 mg via ORAL
  Filled 2024-01-01 (×3): qty 1

## 2024-01-01 MED ORDER — ACETAMINOPHEN 325 MG PO TABS: 650.0000 mg | ORAL_TABLET | Freq: Four times a day (QID) | ORAL | Status: DC | PRN

## 2024-01-01 MED ORDER — WARFARIN SODIUM 5 MG PO TABS: 5.0000 mg | ORAL_TABLET | ORAL | Status: DC

## 2024-01-01 NOTE — H&P (Addendum)
 History and Physical    Patient: Laurie Horton YQM:[id number] DOB: 06/04/1946 DOA: 01/01/2024 DOS: the patient was seen and examined on 01/01/2024 PCP: Georgianne Fick, MD  Patient coming from: Home  Chief Complaint: Chest pain  HPI: Laurie Horton is a 78 y.o. female with medical history significant of hypertension, paroxysmal atrial fibrillation on Coumadin, diastolic CHF, PFO, prior CVA, and COPD on 2 L of oxygen, osteoporosis, scoliosis, and fibromyalgia.at baseline who presents with chest pain and dizziness.  She has been experiencing chest pain and dizziness for the past three days. The chest pain is located on the left side and center of the chest, radiating down the left arm with associated tingling. She also experienced nausea and dizziness, noted upon waking this morning. No recent leg swelling, but she reports neuropathy. She has noticed a weight loss of two pounds recently.  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 She has been in and out of atrial fibrillation for about fifteen days this month, which is more frequent than usual.  Her blood pressure at home was elevated at 152/129 with a pulse of 115-119. She did not take any new medications but mentioned having nitroglycerin upstairs, which she could not access. She takes carvedilol twice daily for rate control and spironolactone, but feels the carvedilol may not be effective anymore.  She uses an [employer] oxygen concentrator set at two liters when cleaning the house and a concentrator at night. There is no mention of acute exacerbation of COPD, but her respiratory status should be monitored given her cardiac issues.  In terms of social history, she mentioned having cats that provide companionship and emotional support. She has lost two pounds recently after gaining weight during the holidays and was planning to sign up for a gym membership at [employer]. In the ED patient was noted to be afebrile with heart rates elevated up to 144 in atrial fibrillation, O2 saturations currently maintained on home 2 L of oxygen.  Labs revealed BNP 219.3, INR 2.2, and high-sensitivity troponin negative.  Chest x-ray noted COPD/emphysematous without acute abnormality.  Influenza, COVID-19, and RSV screening were negative.  Patient was given Zofran, morphine 2 mg IV, Cardizem 10 mg bolus and then placed on a Cardizem drip.  Review of Systems: As mentioned in the history of present illness. All other systems reviewed and are negative. Past Medical History:  Diagnosis Date   Acute diastolic heart failure, NYHA class 1 (HCC) 02/08/2017   Acute heart failure (HCC) 02/08/2017   Cataract    OD   CHF (congestive heart failure) (HCC) 03/02/2017   EF 25-30% 2018   Complication of anesthesia    various issues with oxygen  saturations post op   COPD (chronic obstructive pulmonary disease) (HCC)    Depression    Depression    [employer]reesia 11/04/2020   Diverticulitis    DVT (deep venous thrombosis) (HCC)    Fibromyalgia    GERD (gastroesophageal reflux disease)    Heart murmur    [employer]reesia 11/04/2020   Hiatal hernia    History of deviated nasal septum    left- side   HTN (hypertension)    Hyperlipidemia    Hypertension    [employer]ia 11/04/2020   Hypertensive retinopathy    OU   Myocardial infarction (HCC)    [employer]reesia 11/04/2020   Obesity    On supplemental oxygen therapy    concentrator at night @ 1.5  l/m or when sleeps. O2 Sat niormally 87.   Osteoporosis    Osteoporosis    [employer]ia 11/04/2020   Oxygen deficiency    [employer] 11/04/2020   PAT (paroxysmal atrial tachycardia) (HCC)    PFO (patent foramen ovale)    PULMONARY NODULE, LEFT LOWER LOBE 10/14/2009   5mm LLL nodule dec 2010. Stable and 4mm in Oct 2012. No further fu   PVD (peripheral vascular disease) with claudication (HCC) 12/2017   Right middle lobe pneumonia 07/24/2011   First noted at  admit 07/10/11. Persists on cxr 07/22/11. Cleared on CT 08/24/11. No further followup   Stroke [facility])    TOBACCO ABUSE 06/04/2009   Past Surgical History:  Procedure Laterality Date   ABDOMINAL HYSTERECTOMY N/A    [employer] 11/04/2020   CARDIOVASCULAR STRESS TEST  12/26/2004   EF 74%. NO EVIDENCE OF ISCHEMIA   CATARACT EXTRACTION Left    Dr. Hortense Ramal   ESOPHAGOGASTRODUODENOSCOPY (EGD) WITH PROPOFOL N/A 04/15/2015   Procedure: ESOPHAGOGASTRODUODENOSCOPY (EGD) WITH PROPOFOL;  Surgeon: Carman Ching, MD;  Location: [facility] ENDOSCOPY;  Service: Endoscopy;  Laterality: N/A;   EYE SURGERY Left    Cat Sx   JOINT REPLACEMENT N/A    [employer] 11/04/2020   KNEE ARTHROSCOPY  2000   left   LAPAROSCOPIC CHOLECYSTECTOMY  04-16-2010   [facility]   LOWER EXTREMITY ANGIOGRAPHY N/A 09/09/2017   Procedure: Lower Extremity Angiography;  Surgeon: Runell Gess, MD;  Location: [facility] INVASIVE CV LAB;  Service: Cardiovascular;  Laterality: N/A;   LOWER EXTREMITY INTERVENTION Left 01/17/2018   Procedure: LOWER EXTREMITY INTERVENTION;  Surgeon: Runell Gess, MD;  Location: [facility] INVASIVE CV LAB;  Service: Cardiovascular;  Laterality: Left;   MOUTH SURGERY     03-26-15 multiple extractions stitches remains   PERIPHERAL VASCULAR INTERVENTION Left 01/17/2018   Procedure: PERIPHERAL VASCULAR INTERVENTION;  Surgeon: Runell Gess, MD;  Location: [facility] INVASIVE CV LAB;  Service: Cardiovascular;  Laterality: Left;  COMMON ILIAC   RIGHT/LEFT HEART CATH AND CORONARY ANGIOGRAPHY N/A 03/04/2017   Procedure: Right/Left Heart Cath and Coronary Angiography;  Surgeon: Peter M Swaziland, MD;  Location: [facility] INVASIVE CV LAB;  Service: Cardiovascular;  Laterality: N/A;   TOTAL ABDOMINAL HYSTERECTOMY     post op needed oxygen was told "she gave them a scare"   TUBAL LIGATION     US ECHOCARDIOGRAPHY  11/20/2009   EF 55-60%   Social History:  reports that she quit smoking about 13 years ago. Her smoking use included cigarettes. She started smoking  about 43 years ago. She has a 90 pack-year smoking history. She has never used smokeless tobacco. She reports that she does not drink alcohol and does not use drugs.  Allergies  Allergen Reactions   Alprazolam Anaphylaxis and Other (See Comments)    REACTION: stops breathing   Bee Venom Anaphylaxis   Iodine Anaphylaxis, Swelling and Other (See Comments)    REACTION: swelling in throat   Pseudoephedrine Hcl Er Shortness Of Breath   Budesonide-Formoterol Fumarate Other (See Comments)    Blisters inside of mouth all over   Crestor [Rosuvastatin Calcium] Other (See Comments)    Unable to walk   Esomeprazole Magnesium Other (See Comments)    REACTION: "bouncing off walls"   Flonase [Fluticasone Propionate] Other (See Comments)    NOSE BLEED   Lamictal [Lamotrigine] Rash    Patient got rash, labored breathing, and diarrhea   Loratadine Other (See Comments)    claritin D causes shaking   Lotrimin [Clotrimazole] Other (  See Comments)    Mouth blisters   Lunesta [Eszopiclone] Other (See Comments)    REACTION: "slept for a week"   Oxcarbazepine Other (See Comments)    Causes deep sleep and dizziness   Statins Other (See Comments)    Can't walk, legs won't work    Zolpidem Tartrate Other (See Comments)    REACTION: "slept for a week"   Betadine [Povidone Iodine] Other (See Comments)    Breathing problems   Bevespi Aerosphere [Glycopyrrolate-Formoterol] Other (See Comments)    Pt believes this caused mouth sores and thrush    Breztri Aerosphere [Budeson-Glycopyrrol-Formoterol] Other (See Comments)    Thrush   Clarithromycin Other (See Comments)    All "mycins", Puts into "a" fib, Will take if has to for severe sinus infection   Effexor [Venlafaxine] Nausea And Vomiting and Other (See Comments)    cramps   Lexapro [Escitalopram Oxalate] Other (See Comments)    hallucinations   Aciphex [Rabeprazole Sodium] Rash   Alendronate Sodium Other (See Comments)    "caused stomach problems for 3  days"   Avelox [Moxifloxacin Hcl In Nacl] Other (See Comments)    Stomach cramps.    Bextra [Valdecoxib] Rash   Ceclor [Cefaclor] Rash   Cephalexin Rash and Other (See Comments)    Pt states that she is possibly allergic to this - had a reaction to Cefaclor in the past and she does not want to these class drugs. Added per patient request.   Covera-Hs [Verapamil Hcl] Palpitations   Dicyclomine Hcl Rash   Estradiol Other (See Comments)    Breast soreness (severe).   Other Other (See Comments)    Glue from ekg/heart monitor leads --rash, Any MYCINS   Tessalon Perles Rash    Family History  Problem Relation Age of Onset   Dementia Mother    Diabetes Mother    Alzheimer's disease Mother    Heart attack Brother 27   Heart attack Father    Schizophrenia Sister    Diabetes Sister    Tremor Sister     Prior to Admission medications   Medication Sig Start Date End Date Taking? Authorizing Provider  carvedilol (COREG) 12.5 MG tablet TAKE 1 TABLET BY MOUTH 2 TIMES DAILY. 03/10/23   [place], Peter M, MD  clopidogrel (PLAVIX) 75 MG tablet TAKE 1 TABLET BY MOUTH EVERY DAY 05/04/23   [place], Peter M, MD  desvenlafaxine (PRISTIQ) 25 MG 24 hr tablet Take 1 tablet (25 mg total) by mouth daily. 12/30/23   Arfeen, Phillips Grout, MD  Evolocumab (REPATHA SURECLICK) 140 MG/ML SOAJ Inject 140 mg into the skin every 14 (fourteen) days. 11/18/23   [place], Peter M, MD  Eyelid Cleansers (OCUSOFT BABY EYELID & EYELASH EX) Apply topically.    [provider]  furosemide (LASIX) 40 MG tablet TAKE 1 TABLET BY MOUTH EVERY DAY 10/11/23   [place], Peter M, MD  gabapentin (NEURONTIN) 100 MG capsule Take 200 mg by mouth See admin instructions. Take 1 in am and 1 in the lunch and 2 at bedtime 09/16/16   [provider]  HYDROcodone-acetaminophen (NORCO/VICODIN) 5-325 MG tablet Take 1 tablet by mouth in the morning and at bedtime.    [provider]  ipratropium (ATROVENT) 0.02 % nebulizer solution Take  1.25 mLs (0.25 mg total) by nebulization every 4 (four) hours as needed for wheezing or shortness of breath. Ellason Segar 10/23/22   [name], MD  montelukast (SINGULAIR) 10 MG tablet TAKE 1 TABLET BY MOUTH EVERYDAY  AT BEDTIME Patient taking differently: Take 10 mg by mouth daily. 07/02/21   Shade Flood, MD  nitroGLYCERIN (NITROSTAT) 0.4 MG SL tablet PLACE 1 TABLET UNDER THE TONGUE EVERY 5 MINUTES AS NEEDED FOR CHEST PAIN. 12/21/23   [place], Peter M, MD  OXYGEN Inhale 1.5-3 L into the lungs as needed (for shortness of breath).    [provider]  pantoprazole (PROTONIX) 40 MG tablet TAKE 1 TABLET BY MOUTH EVERY DAY 11/28/21   Shade Flood, MD  polyethylene glycol [facility] / GLYCOLAX) packet Take 17 g by mouth daily as needed for mild constipation.     [provider]  spironolactone (ALDACTONE) 25 MG tablet TAKE 1 TABLET BY MOUTH EVERY DAY 06/30/23   [place], Peter M, MD  traZODone (DESYREL) 100 MG tablet Take one tab daily as needed for sleep 12/30/23   Arfeen, Phillips Grout, MD  warfarin (COUMADIN) 5 MG tablet TAKE ONE-HALF TO 1 TABLET BY MOUTH DAILY OR AS DIRECTED BY CLINIC 09/27/23   [place], Peter M, MD    Physical Exam: Vitals:   01/01/24 1141 01/01/24 1200 01/01/24 1230 01/01/24 1235  BP:  102/78  104/82  Pulse:  (!) 144 (!) 144 (!) 112  Resp:  (!) 28 19 (!) 24  Temp: 98.3 F (36.8 C)     SpO2:  92% 95% (!) 89%  Weight:      Height:       Constitutional: Elderly female who appears to be in no acute distress at this time eating lunch Eyes: Blind in right eye.  Lids and conjunctivae normal ENMT: Mucous membranes are moist.    Neck: normal, supple, no significant JVD appreciated Respiratory: Normal respiratory effort with some intermittent rhonchi but no significant wheezes appreciated at this time. Cardiovascular: Irregular irregular. No extremity edema.   Abdomen: no tenderness, no masses palpated Bowel sounds positive.  Musculoskeletal: no clubbing /  cyanosis. No joint deformity upper and lower extremities. Good ROM, no contractures. Normal muscle tone.  Skin: no rashes, lesions, ulcers. No induration Neurologic: CN 2-12 grossly intact. Sensation intact, DTR normal. Strength 5/5 in all 4.  Psychiatric: Normal judgment and insight. Alert and oriented x 3. Normal mood.   Data Reviewed:  EKG revealed atrial fibrillation at 95 bpm.  Reviewed labs, imaging, and pertinent records as documented.  Assessment and Plan:  Paroxysmal atrial fibrillation with RVR On chronic anticoagulation Patient presented with reports of left-sided chest pain and palpitations.  Found in A-fib with RVR with heart rates into the 140s.  High-sensitivity troponins were negative x 2.  INR therapeutic at 2.2.  Patient had been taking all medications at home including Coreg as far as, but reported having 15 days this month where she had been in atrial fibrillation which is unusual for her.CHA2DS2-VASc score equal to 6. -Admit to a cardiac telemetry bed -Coumadin per pharmacy -Goal potassium at least 4 and magnesium at least 2 -Continue Cardizem drip -Check echocardiogram -Appreciate cardiology consultative services we will follow-up for any further recommendations  Heart failure with preserved EF Chronic.  Patient does not appear grossly fluid overloaded on physical exam.  BNP was just mildly elevated at 219.3.  Last echocardiogram noted EF to be 55 to 60% with normal diastolic parameters when last checked in 12/06/2021. -Follow-up echocardiogram -Continue furosemide and spironolactone  COPD Chronic respiratory failure with hypoxia Patient reports being on 2 L of oxygen at baseline.  No active wheezing appreciated on physical exam at this time. -Continue nasal cannula oxygen  Essential hypertension Blood pressures have been soft 99/71 to 118/88. -Resume home blood pressure medications as tolerated  Coronary artery disease Last cardiac catheterization showed  severe two-vessel obstructive disease of the OM1 and OM 2 branches then 100% occlusion of the RCA with collaterals present back in 2018.  Medical management was recommended. -Continue Plavix and statin  Hyperlipidemia Patient is on Repatha injections at home. -Recommended her to continue Repatha after discharge  Fibromyalgia Chronic pain Anxiety -Continue venlafaxine, gabapentin, trazodone, and hydrocodone  DVT prophylaxis: Coumadin  Advance Care Planning:   Code Status: Full Code   Consults: Cardiology  Family Communication: None  Severity of Illness: The appropriate patient status for this patient is OBSERVATION. Observation status is judged to be reasonable and necessary in order to provide the required intensity of service to ensure the patient's safety. The patient's presenting symptoms, physical exam findings, and initial radiographic and laboratory data in the context of their medical condition is felt to place them at decreased risk for further clinical deterioration. Furthermore, it is anticipated that the patient will be medically stable for discharge from the hospital within 2 midnights of admission.   Author: Clydie Braun, MD 01/01/2024 1:39 PM  For on call review [email or web address].

## 2024-01-01 NOTE — ED Provider Notes (Signed)
 [place] EMERGENCY DEPARTMENT AT [facility] Provider Note   CSN: [id number] Arrival date & time: 01/01/24  9518     History  No chief complaint on file.   Laurie Horton is a 78 y.o. female.  78 year old female presents today for concern of chest pain, shortness of breath since this morning.  She states she has been intermittently in and out of A-fib for the past 15 days.  She states this episode has been worse and has lasted the longest amount of time.  Currently without chest pain.  She received 2 doses of diltiazem with the EMS crew.  The history is provided by the patient. No language interpreter was used.       Home Medications Prior to Admission medications   Medication Sig Start Date End Date Taking? Authorizing Provider  carvedilol (COREG) 12.5 MG tablet TAKE 1 TABLET BY MOUTH 2 TIMES DAILY. 03/10/23   [place], Peter M, MD  clopidogrel (PLAVIX) 75 MG tablet TAKE 1 TABLET BY MOUTH EVERY DAY 05/04/23   [place], Peter M, MD  desvenlafaxine (PRISTIQ) 25 MG 24 hr tablet Take 1 tablet (25 mg total) by mouth daily. 12/30/23   Arfeen, Phillips Grout, MD  Evolocumab (REPATHA SURECLICK) 140 MG/ML SOAJ Inject 140 mg into the skin every 14 (fourteen) days. 11/18/23   [place], Peter M, MD  Eyelid Cleansers (OCUSOFT BABY EYELID & EYELASH EX) Apply topically.    [provider]  furosemide (LASIX) 40 MG tablet TAKE 1 TABLET BY MOUTH EVERY DAY 10/11/23   [place], Peter M, MD  gabapentin (NEURONTIN) 100 MG capsule Take 200 mg by mouth See admin instructions. Take 1 in am and 1 in the lunch and 2 at bedtime 09/16/16   [provider]  HYDROcodone-acetaminophen (NORCO/VICODIN) 5-325 MG tablet Take 1 tablet by mouth in the morning and at bedtime.    [provider]  ipratropium (ATROVENT) 0.02 % nebulizer solution Take 1.25 mLs (0.25 mg total) by nebulization every 4 (four) hours as needed for wheezing or shortness of breath. Momoko Slezak 10/23/22   [name],  MD  montelukast (SINGULAIR) 10 MG tablet TAKE 1 TABLET BY MOUTH EVERYDAY AT BEDTIME Patient taking differently: Take 10 mg by mouth daily. 07/02/21   Shade Flood, MD  nitroGLYCERIN (NITROSTAT) 0.4 MG SL tablet PLACE 1 TABLET UNDER THE TONGUE EVERY 5 MINUTES AS NEEDED FOR CHEST PAIN. 12/21/23   [place], Peter M, MD  OXYGEN Inhale 1.5-3 L into the lungs as needed (for shortness of breath).    [provider]  pantoprazole (PROTONIX) 40 MG tablet TAKE 1 TABLET BY MOUTH EVERY DAY 11/28/21   Shade Flood, MD  polyethylene glycol [facility] / GLYCOLAX) packet Take 17 g by mouth daily as needed for mild constipation.     [provider]  spironolactone (ALDACTONE) 25 MG tablet TAKE 1 TABLET BY MOUTH EVERY DAY 06/30/23   [place], Peter M, MD  traZODone (DESYREL) 100 MG tablet Take one tab daily as needed for sleep 12/30/23   Arfeen, Phillips Grout, MD  warfarin (COUMADIN) 5 MG tablet TAKE ONE-HALF TO 1 TABLET BY MOUTH DAILY OR AS DIRECTED BY CLINIC 09/27/23   [place], Peter M, MD      Allergies    Alprazolam, Bee venom, Iodine, Pseudoephedrine hcl er, Budesonide-formoterol fumarate, Crestor [rosuvastatin calcium], Esomeprazole magnesium, Flonase [fluticasone propionate], Lamictal [lamotrigine], Loratadine, Lotrimin [clotrimazole], Lunesta [eszopiclone], Oxcarbazepine, Statins, Zolpidem tartrate, Betadine [povidone iodine], Bevespi aerosphere [glycopyrrolate-formoterol], Breztri aerosphere [budeson-glycopyrrol-formoterol], Clarithromycin, Effexor [venlafaxine],  Lexapro [escitalopram oxalate], Aciphex [rabeprazole sodium], Alendronate sodium, Avelox [moxifloxacin hcl in nacl], Bextra [valdecoxib], Ceclor [cefaclor], Cephalexin, Covera-hs [verapamil hcl], Dicyclomine hcl, Estradiol, Other, and Tessalon perles    Review of Systems   Review of Systems  Constitutional:  Negative for fever.  Respiratory:  Positive for cough and shortness of breath.   Cardiovascular:  Positive for chest pain and  palpitations.  Neurological:  Negative for light-headedness.  All other systems reviewed and are negative.   Physical Exam Updated Vital Signs BP 104/82   Pulse (!) 112   Temp 98.3 F (36.8 C)   Resp (!) 24   Ht 5\' 4"  (1.626 m)   Wt 84.4 kg   SpO2 (!) 89%   BMI 31.93 kg/m  Physical Exam Vitals and nursing note reviewed.  Constitutional:      General: She is not in acute distress.    Appearance: Normal appearance. She is not ill-appearing.  HENT:     Head: Normocephalic and atraumatic.     Nose: Nose normal.  Eyes:     Conjunctiva/sclera: Conjunctivae normal.  Cardiovascular:     Rate and Rhythm: Tachycardia present. Rhythm irregular.  Pulmonary:     Effort: Pulmonary effort is normal. No respiratory distress.  Musculoskeletal:        General: No deformity.     Right lower leg: No edema.     Left lower leg: No edema.  Skin:    Findings: No rash.  Neurological:     Mental Status: She is alert.     ED Results / Procedures / Treatments   Labs (all labs ordered are listed, but only abnormal results are displayed) Labs Reviewed  BASIC METABOLIC PANEL - Abnormal; Notable for the following components:      Result Value   Glucose, Bld 128 (*)    All other components within normal limits  PROTIME-INR - Abnormal; Notable for the following components:   Prothrombin Time 24.9 (*)    INR 2.2 (*)    All other components within normal limits  BRAIN NATRIURETIC PEPTIDE - Abnormal; Notable for the following components:   B Natriuretic Peptide 219.3 (*)    All other components within normal limits  RESP PANEL BY RT-PCR (RSV, FLU A&B, COVID)  RVPGX2  CBC WITH DIFFERENTIAL/PLATELET  TROPONIN I (HIGH SENSITIVITY)    EKG EKG Interpretation Date/Time:  Saturday January 01 2024 09:14:56 EST Ventricular Rate:  95 PR Interval:    QRS Duration:  111 QT Interval:  346 QTC Calculation: 435 R Axis:   86  Text Interpretation: Atrial fibrillation Non-specific ST-t changes  Confirmed by Cathren Laine ([id number]) on 01/01/2024 9:17:59 AM  Radiology DG Chest Port 1 View Result Date: 01/01/2024 CLINICAL DATA:  Atrial fibrillation. Pain with breathing. Unproductive cough. EXAM: PORTABLE CHEST 1 VIEW COMPARISON:  08/18/2023. FINDINGS: Cardiac enlargement. No signs of pleural effusion or edema. Chronic coarsened interstitial changes of COPD/emphysema. No superimposed airspace consolidation. IMPRESSION: 1. No acute findings. 2. COPD/emphysema. Electronically Signed   By: Signa Kell M.D.   On: 01/01/2024 10:21    Procedures .Critical Care  Performed by: Marita Kansas, PA-C Authorized by: Marita Kansas, PA-C   Critical care provider statement:    Critical care time (minutes):  30   Critical care was time spent personally by me on the following activities:  Development of treatment plan with patient or surrogate, discussions with consultants, evaluation of patient's response to treatment, examination of patient, ordering and review of laboratory studies, ordering and review  of radiographic studies, ordering and performing treatments and interventions, pulse oximetry, re-evaluation of patient's condition and review of old charts   Care discussed with: admitting provider       Medications Ordered in ED Medications  diltiazem (CARDIZEM) 1 mg/mL load via infusion 10 mg (10 mg Intravenous Bolus from Bag 01/01/24 1224)    And  diltiazem (CARDIZEM) 125 mg in dextrose 5% 125 mL (1 mg/mL) infusion (5 mg/hr Intravenous New Bag/Given 01/01/24 1224)  morphine (PF) 2 MG/ML injection 2 mg (2 mg Intravenous Given 01/01/24 1048)  ondansetron (ZOFRAN) injection 4 mg (4 mg Intravenous Given 01/01/24 1045)    ED Course/ Medical Decision Making/ A&P                                 Medical Decision Making Amount and/or Complexity of Data Reviewed Labs: ordered. Radiology: ordered. ECG/medicine tests: ordered.  Risk Prescription drug management. Decision regarding hospitalization.   Medical  Decision Making / ED Course   This patient presents to the ED for concern of chest pain, shortness of breath, palpitations, this involves an extensive number of treatment options, and is a complaint that carries with it a high risk of complications and morbidity.  The differential diagnosis includes ACS, PE, pneumonia, A-fib  MDM: 78 year old female with past medical history of atrial fibrillation, CHF presents today for concern of above-mentioned complaints.  Currently in A-fib RVR.  Admission considered but will reevaluate after labs and imaging.  She received 2 doses of diltiazem with EMS.  Eventually her heart rate climbed to 140s.  Required additional bolus of diltiazem as well as diltiazem drip.  Vagal maneuvers were tried but not successful.  Rate improved on diltiazem drip.  Discussed with hospitalist will evaluate patient for admission.  Request cardiology consultation.  Discussed with cardiology.  They will consult.  Chest x-ray lab Tests: -I ordered, reviewed, and interpreted labs.   The pertinent results include:   Labs Reviewed  BASIC METABOLIC PANEL - Abnormal; Notable for the following components:      Result Value   Glucose, Bld 128 (*)    All other components within normal limits  PROTIME-INR - Abnormal; Notable for the following components:   Prothrombin Time 24.9 (*)    INR 2.2 (*)    All other components within normal limits  BRAIN NATRIURETIC PEPTIDE - Abnormal; Notable for the following components:   B Natriuretic Peptide 219.3 (*)    All other components within normal limits  RESP PANEL BY RT-PCR (RSV, FLU A&B, COVID)  RVPGX2  CBC WITH DIFFERENTIAL/PLATELET  TSH  MAGNESIUM  TROPONIN I (HIGH SENSITIVITY)      EKG  EKG Interpretation Date/Time:  Saturday January 01 2024 09:14:56 EST Ventricular Rate:  95 PR Interval:    QRS Duration:  111 QT Interval:  346 QTC Calculation: 435 R Axis:   86  Text Interpretation: Atrial fibrillation Non-specific ST-t  changes Confirmed by Cathren Laine ([id number]) on 01/01/2024 9:17:59 AM         Imaging Studies ordered: I ordered imaging studies including chest x-ray I independently visualized and interpreted imaging. I agree with the radiologist interpretation   Medicines ordered and prescription drug management: Meds ordered this encounter  Medications   morphine (PF) 2 MG/ML injection 2 mg   ondansetron (ZOFRAN) injection 4 mg   AND Linked Order Group    diltiazem (CARDIZEM) 1 mg/mL load via infusion 10 mg  diltiazem (CARDIZEM) 125 mg in dextrose 5% 125 mL (1 mg/mL) infusion   sodium chloride flush (NS) 0.9 % injection 3 mL   OR Linked Order Group    acetaminophen (TYLENOL) tablet 650 mg    acetaminophen (TYLENOL) suppository 650 mg   albuterol (PROVENTIL) (2.5 MG/3ML) 0.083% nebulizer solution 2.5 mg    -I have reviewed the patients home medicines and have made adjustments as needed  Critical interventions Diltiazem drip  Consultations Obtained: I requested consultation with the cardiology,  and discussed lab and imaging findings as well as pertinent plan - they recommend: As above   Cardiac Monitoring: The patient was maintained on a cardiac monitor.  I personally viewed and interpreted the cardiac monitored which showed an underlying rhythm of: A-fib with RVR   Reevaluation: After the interventions noted above, I reevaluated the patient and found that they have :stayed the same  Co morbidities that complicate the patient evaluation  Past Medical History:  Diagnosis Date   Acute diastolic heart failure, NYHA class 1 (HCC) 02/08/2017   Acute heart failure (HCC) 02/08/2017   Cataract    OD   CHF (congestive heart failure) (HCC) 03/02/2017   EF 25-30% 2018   Complication of anesthesia    various issues with oxygen  saturations post op   COPD (chronic obstructive pulmonary disease) (HCC)    Depression    Depression    Phreesia 11/04/2020   Diverticulitis    DVT (deep venous  thrombosis) (HCC)    Fibromyalgia    GERD (gastroesophageal reflux disease)    Heart murmur    Phreesia 11/04/2020   Hiatal hernia    History of deviated nasal septum    left- side   HTN (hypertension)    Hyperlipidemia    Hypertension    Phreesia 11/04/2020   Hypertensive retinopathy    OU   Myocardial infarction (HCC)    Phreesia 11/04/2020   Obesity    On supplemental oxygen therapy    concentrator at night @ 1.5 l/m or when sleeps. O2 Sat niormally 87.   Osteoporosis    Osteoporosis    Phreesia 11/04/2020   Oxygen deficiency    Phreesia 11/04/2020   PAT (paroxysmal atrial tachycardia) (HCC)    PFO (patent foramen ovale)    PULMONARY NODULE, LEFT LOWER LOBE 10/14/2009   5mm LLL nodule dec 2010. Stable and 4mm in Oct 2012. 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 Persists on cxr 07/22/11. Cleared on CT 08/24/11. No further followup   Stroke [facility])    TOBACCO ABUSE 06/04/2009      Dispostion: Discussed with hospitalist will evaluate patient for admission.   Final Clinical Impression(s) / ED Diagnoses Final diagnoses:  Atrial fibrillation with RVR [facility])    Rx / DC Orders ED Discharge Orders     None         Marita Kansas, PA-C 01/01/24 1416    Cathren Laine, MD 01/02/24 [phone]

## 2024-01-01 NOTE — ED Triage Notes (Addendum)
 According to [facility]: Pt has been having intermittent chest discomfort last 15 days, has been worsening. Pt called ems, pt had obtain 152/115 bp. Sob on arrival but not wearing oxygen. Pt wears o2 at night. Hr was 130-160s on arrival, 10 mg of cardizem given. Hr decrease to 90-120s, then pt hr elevated, ems gave another 10mg  of cardizem.  Last bp  124 palpated Hr 80-110s Cbg 144  97%  spo2 on 4l o2  22 in left wrist.  HX of afib,chf, high cholesterol, hyper tension, multiple sclerosis.  Did not take prescribed hydrocodone 325 mg  this am.  Ems ecg showed afib.

## 2024-01-01 NOTE — ED Notes (Signed)
 Report given to [facility]. no questions at this time.  Pt is no new onset distress at this time

## 2024-01-01 NOTE — Progress Notes (Signed)
 ANTICOAGULATION CONSULT NOTE  Pharmacy Consult for Warfarin Indication: atrial fibrillation  Allergies  Allergen Reactions   Alprazolam Anaphylaxis and Other (See Comments)    REACTION: stops breathing   Bee Venom Anaphylaxis   Iodine Anaphylaxis, Swelling and Other (See Comments)    REACTION: swelling in throat   Pseudoephedrine Hcl Er Shortness Of Breath   Budesonide-Formoterol Fumarate Other (See Comments)    Blisters inside of mouth all over   Crestor [Rosuvastatin Calcium] Other (See Comments)    Unable to walk   Esomeprazole Magnesium Other (See Comments)    REACTION: "bouncing off walls"   Flonase [Fluticasone Propionate] Other (See Comments)    NOSE BLEED   Lamictal [Lamotrigine] Rash    Patient got rash, labored breathing, and diarrhea   Loratadine Other (See Comments)    claritin D causes shaking   Lotrimin [Clotrimazole] Other (See Comments)    Mouth blisters   Lunesta [Eszopiclone] Other (See Comments)    REACTION: "slept for a week"   Oxcarbazepine Other (See Comments)    Causes deep sleep and dizziness   Statins Other (See Comments)    Can't walk, legs won't work    Zolpidem Tartrate Other (See Comments)    REACTION: "slept for a week"   Betadine [Povidone Iodine] Other (See Comments)    Breathing problems   Bevespi Aerosphere [Glycopyrrolate-Formoterol] Other (See Comments)    Pt believes this caused mouth sores and thrush    Breztri Aerosphere [Budeson-Glycopyrrol-Formoterol] Other (See Comments)    Thrush   Clarithromycin Other (See Comments)    All "mycins", Puts into "a" fib, Will take if has to for severe sinus infection   Effexor [Venlafaxine] Nausea And Vomiting and Other (See Comments)    cramps   Lexapro [Escitalopram Oxalate] Other (See Comments)    hallucinations   Aciphex [Rabeprazole Sodium] Rash   Alendronate Sodium Other (See Comments)    "caused stomach problems for 3 days"   Avelox [Moxifloxacin Hcl In Nacl] Other (See Comments)     Stomach cramps.    Bextra [Valdecoxib] Rash   Ceclor [Cefaclor] Rash   Cephalexin Rash and Other (See Comments)    Pt states that she is possibly allergic to this - had a reaction to Cefaclor in the past and she does not want to these class drugs. Added per patient request.   Covera-Hs [Verapamil Hcl] Palpitations   Dicyclomine Hcl Rash   Estradiol Other (See Comments)    Breast soreness (severe).   Other Other (See Comments)    Glue from ekg/heart monitor leads --rash, Any MYCINS   Tessalon Perles Rash    Patient Measurements: Height: 5\' 4"  (162.6 cm) Weight: 84.4 kg (186 lb) IBW/kg (Calculated) : 54.7  Vital Signs: Temp: 98.3 F (36.8 C) (03/01 1141) BP: 104/82 (03/01 1235) Pulse Rate: 112 (03/01 1235)  Labs: Recent Labs    12/31/23 1259 01/01/24 0935  HGB  --  14.1  HCT  --  42.6  PLT  --  238  LABPROT  --  24.9*  INR 2.3 2.2*  CREATININE  --  0.94  TROPONINIHS  --  9    Estimated Creatinine Clearance: 51.9 mL/min (by C-G formula based on SCr of 0.94 mg/dL).   Medical History: Past Medical History:  Diagnosis Date   Acute diastolic heart failure, NYHA class 1 (HCC) 02/08/2017   Acute heart failure (HCC) 02/08/2017   Cataract    OD   CHF (congestive heart failure) (HCC) 03/02/2017   EF 25-30%  2018   Complication of anesthesia    various issues with oxygen  saturations post op   COPD (chronic obstructive pulmonary disease) (HCC)    Depression    Depression    Phreesia 11/04/2020   Diverticulitis    DVT (deep venous thrombosis) (HCC)    Fibromyalgia    GERD (gastroesophageal reflux disease)    Heart murmur    Phreesia 11/04/2020   Hiatal hernia    History of deviated nasal septum    left- side   HTN (hypertension)    Hyperlipidemia    Hypertension    Phreesia 11/04/2020   Hypertensive retinopathy    OU   Myocardial infarction (HCC)    Phreesia 11/04/2020   Obesity    On supplemental oxygen therapy    concentrator at night @ 1.5 l/m or when  sleeps. O2 Sat niormally 87.   Osteoporosis    Osteoporosis    Phreesia 11/04/2020   Oxygen deficiency    Phreesia 11/04/2020   PAT (paroxysmal atrial tachycardia) (HCC)    PFO (patent foramen ovale)    PULMONARY NODULE, LEFT LOWER LOBE 10/14/2009   5mm LLL nodule dec 2010. Stable and 4mm in Oct 2012. 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 Persists on cxr 07/22/11. Cleared on CT 08/24/11. No further followup   Stroke [facility])    TOBACCO ABUSE 06/04/2009    Medications:  (Not in a hospital admission)  Scheduled:   sodium chloride flush  3 mL Intravenous Q12H   Infusions:   diltiazem (CARDIZEM) infusion 5 mg/hr (01/01/24 1224)   PRN: acetaminophen **OR** acetaminophen, albuterol  Assessment: 39 yof with a history of AF, HF. Patient is presenting with AF RVR. Warfarin per pharmacy consult placed for atrial fibrillation.  Patient taking warfarin prior to arrival. Home dose is 5 mg (5 mg x 1) every Mon, Fri; 2.5 mg (5 mg x 0.5) all other days. Last taken 2/28 1600.  PT / INR today is 24.9 / 2.2, which is therapeutic Hgb 14.1; plt 238  Goal of Therapy:  INR Goal 2-3 Monitor platelets by anticoagulation protocol: Yes   Plan:  Resume home regimen of warfarin (5 mg (5 mg x 1) every Mon, Fri; 2.5 mg (5 mg x 0.5) all other days) Monitor for s/s of hemorrhage, daily INR, CBC Watch for new DDIs  Delmar Landau, PharmD, BCPS 01/01/2024 1:58 PM ED Clinical Pharmacist -  [phone]

## 2024-01-01 NOTE — Consult Note (Signed)
 CONSULTATION NOTE   Patient Name: Laurie Horton Date of Encounter: 01/01/2024 Cardiologist: Peter Swaziland, MD Electrophysiologist: None Advanced Heart Failure: None   Chief Complaint   Chest discomfort  Patient Profile   78 yo female patient of Dr. Swaziland with a history of atrial fibrillation and chronic systolic heart failure with LVEF as low as 25 to 30% recently normalized by echo in 2023.  Now presents with A-fib and RVR.  HPI   Laurie Horton is a 78 y.o. female who is being seen today for the evaluation of chest discomfort at the request of Dr. Katrinka Blazing.  This is a 78 year old female patient of Dr. Swaziland with a history of intermittent chest discomfort for a couple weeks has been worsening.  She actually had her INR drawn in our cardiology office yesterday and noted her tachycardia and hypertension to the nurse.  She said all this started after an argument with her daughter.  Since her symptoms did not improve, she called EMS and was noted to be hypertensive and short of breath as well as tachycardic with A-fib and heart rate in the 130s to 160s.  She was brought to the hospital for rate control placed on IV diltiazem with improvement in symptoms.  She has a history of atrial fibrillation on warfarin.  She also has a history of systolic heart failure with LVEF 25 to 30% back in 2018 however that normalized on guideline directed medical therapy in 2023 by echo.  Cardiac catheterization in the past also showed severe two-vessel obstructive disease of the OM1 and OM 2 branches and 100% occlusion of the RCA with left-to-right collaterals.  Labs on initial presentation showed mildly elevated BNP at 219, troponin is negative at 9, INR 2.2, respiratory viral panel negative, chest x-ray shows no acute findings and COPD/emphysema changes.  EKG shows atrial fibrillation at 95.  PMHx   Past Medical History:  Diagnosis Date   Acute diastolic heart failure, NYHA class 1 (HCC) 02/08/2017    Acute heart failure (HCC) 02/08/2017   Cataract    OD   CHF (congestive heart failure) (HCC) 03/02/2017   EF 25-30% 2018   Complication of anesthesia    various issues with oxygen  saturations post op   COPD (chronic obstructive pulmonary disease) (HCC)    Depression    Depression    Phreesia 11/04/2020   Diverticulitis    DVT (deep venous thrombosis) (HCC)    Fibromyalgia    GERD (gastroesophageal reflux disease)    Heart murmur    Phreesia 11/04/2020   Hiatal hernia    History of deviated nasal septum    left- side   HTN (hypertension)    Hyperlipidemia    Hypertension    Phreesia 11/04/2020   Hypertensive retinopathy    OU   Myocardial infarction (HCC)    Phreesia 11/04/2020   Obesity    On supplemental oxygen therapy    concentrator at night @ 1.5 l/m or when sleeps. O2 Sat niormally 87.   Osteoporosis    Osteoporosis    Phreesia 11/04/2020   Oxygen deficiency    Phreesia 11/04/2020   PAT (paroxysmal atrial tachycardia) (HCC)    PFO (patent foramen ovale)    PULMONARY NODULE, LEFT LOWER LOBE 10/14/2009   5mm LLL nodule dec 2010. Stable and 4mm in Oct 2012. No further fu   PVD (peripheral vascular disease) with claudication (HCC) 12/2017   Right middle lobe pneumonia 07/24/2011   First noted at admit  07/10/11. Persists on cxr 07/22/11. Cleared on CT 08/24/11. No further followup   Stroke [facility])    TOBACCO ABUSE 06/04/2009    Past Surgical History:  Procedure Laterality Date   ABDOMINAL HYSTERECTOMY N/A    [employer]reesia 11/04/2020   CARDIOVASCULAR STRESS TEST  12/26/2004   EF 74%. NO EVIDENCE OF ISCHEMIA   CATARACT EXTRACTION Left    Dr. Hortense Ramal   ESOPHAGOGASTRODUODENOSCOPY (EGD) WITH PROPOFOL N/A 04/15/2015   Procedure: ESOPHAGOGASTRODUODENOSCOPY (EGD) WITH PROPOFOL;  Surgeon: Carman Ching, MD;  Location: [facility];  Service: Endoscopy;  Laterality: N/A;   EYE SURGERY Left    Cat Sx   JOINT REPLACEMENT N/A    Phreesia 11/04/2020   KNEE ARTHROSCOPY  2000   left    LAPAROSCOPIC CHOLECYSTECTOMY  04-16-2010   [facility]   LOWER EXTREMITY ANGIOGRAPHY N/A 09/09/2017   Procedure: Lower Extremity Angiography;  Surgeon: Runell Gess, MD;  Location: [facility] INVASIVE CV LAB;  Service: Cardiovascular;  Laterality: N/A;   LOWER EXTREMITY INTERVENTION Left 01/17/2018   Procedure: LOWER EXTREMITY INTERVENTION;  Surgeon: Runell Gess, MD;  Location: [facility] INVASIVE CV LAB;  Service: Cardiovascular;  Laterality: Left;   MOUTH SURGERY     03-26-15 multiple extractions stitches remains   PERIPHERAL VASCULAR INTERVENTION Left 01/17/2018   Procedure: PERIPHERAL VASCULAR INTERVENTION;  Surgeon: Runell Gess, MD;  Location: [facility] INVASIVE CV LAB;  Service: Cardiovascular;  Laterality: Left;  COMMON ILIAC   RIGHT/LEFT HEART CATH AND CORONARY ANGIOGRAPHY N/A 03/04/2017   Procedure: Right/Left Heart Cath and Coronary Angiography;  Surgeon: Peter M Swaziland, MD;  Location: [facility] INVASIVE CV LAB;  Service: Cardiovascular;  Laterality: N/A;   TOTAL ABDOMINAL HYSTERECTOMY     post op needed oxygen was told "she gave them a scare"   TUBAL LIGATION     US ECHOCARDIOGRAPHY  11/20/2009   EF 55-60%    FAMHx   Family History  Problem Relation Age of Onset   Dementia Mother    Diabetes Mother    Alzheimer's disease Mother    Heart attack Brother 74   Heart attack Father    Schizophrenia Sister    Diabetes Sister    Tremor Sister     SOCHx    reports that she quit smoking about 13 years ago. Her smoking use included cigarettes. She started smoking about 43 years ago. She has a 90 pack-year smoking history. She has never used smokeless tobacco. She reports that she does not drink alcohol and does not use drugs.  Outpatient Medications   No current facility-administered medications on file prior to encounter.   Current Outpatient Medications on File Prior to Encounter  Medication Sig Dispense Refill   carvedilol (COREG) 12.5 MG tablet TAKE 1 TABLET BY MOUTH 2 TIMES DAILY. 60 tablet 11    clopidogrel (PLAVIX) 75 MG tablet TAKE 1 TABLET BY MOUTH EVERY DAY 30 tablet 11   desvenlafaxine (PRISTIQ) 25 MG 24 hr tablet Take 1 tablet (25 mg total) by mouth daily. 30 tablet 2   Evolocumab (REPATHA SURECLICK) 140 MG/ML SOAJ Inject 140 mg into the skin every 14 (fourteen) days. 6 mL 1   Eyelid Cleansers (OCUSOFT BABY EYELID & EYELASH EX) Apply topically.     furosemide (LASIX) 40 MG tablet TAKE 1 TABLET BY MOUTH EVERY DAY 90 tablet 2   gabapentin (NEURONTIN) 100 MG capsule Take 200 mg by mouth See admin instructions. Take 1 in am and 1 in the lunch and 2 at bedtime  HYDROcodone-acetaminophen (NORCO/VICODIN) 5-325 MG tablet Take 1 tablet by mouth in the morning and at bedtime.     ipratropium (ATROVENT) 0.02 % nebulizer solution Take 1.25 mLs (0.25 mg total) by nebulization every 4 (four) hours as needed for wheezing or shortness of breath. [name] 62.5 mL 5   montelukast (SINGULAIR) 10 MG tablet TAKE 1 TABLET BY MOUTH EVERYDAY AT BEDTIME (Patient taking differently: Take 10 mg by mouth daily.) 90 tablet 2   nitroGLYCERIN (NITROSTAT) 0.4 MG SL tablet PLACE 1 TABLET UNDER THE TONGUE EVERY 5 MINUTES AS NEEDED FOR CHEST PAIN. 25 tablet 7   OXYGEN Inhale 1.5-3 L into the lungs as needed (for shortness of breath).     pantoprazole (PROTONIX) 40 MG tablet TAKE 1 TABLET BY MOUTH EVERY DAY 30 tablet 5   polyethylene glycol (MIRALAX / GLYCOLAX) packet Take 17 g by mouth daily as needed for mild constipation.      spironolactone (ALDACTONE) 25 MG tablet TAKE 1 TABLET BY MOUTH EVERY DAY 90 tablet 2   traZODone (DESYREL) 100 MG tablet Take one tab daily as needed for sleep 30 tablet 2   warfarin (COUMADIN) 5 MG tablet TAKE ONE-HALF TO 1 TABLET BY MOUTH DAILY OR AS DIRECTED BY CLINIC 30 tablet 3    Inpatient Medications    Scheduled Meds:   Continuous Infusions:  diltiazem (CARDIZEM) infusion 5 mg/hr (01/01/24 1224)    PRN Meds:    ALLERGIES   Allergies  Allergen Reactions    Alprazolam Anaphylaxis and Other (See Comments)    REACTION: stops breathing   Bee Venom Anaphylaxis   Iodine Anaphylaxis, Swelling and Other (See Comments)    REACTION: swelling in throat   Pseudoephedrine Hcl Er Shortness Of Breath   Budesonide-Formoterol Fumarate Other (See Comments)    Blisters inside of mouth all over   Crestor [Rosuvastatin Calcium] Other (See Comments)    Unable to walk   Esomeprazole Magnesium Other (See Comments)    REACTION: "bouncing off walls"   Flonase [Fluticasone Propionate] Other (See Comments)    NOSE BLEED   Lamictal [Lamotrigine] Rash    Patient got rash, labored breathing, and diarrhea   Loratadine Other (See Comments)    claritin D causes shaking   Lotrimin [Clotrimazole] Other (See Comments)    Mouth blisters   Lunesta [Eszopiclone] Other (See Comments)    REACTION: "slept for a week"   Oxcarbazepine Other (See Comments)    Causes deep sleep and dizziness   Statins Other (See Comments)    Can't walk, legs won't work    Zolpidem Tartrate Other (See Comments)    REACTION: "slept for a week"   Betadine [Povidone Iodine] Other (See Comments)    Breathing problems   Bevespi Aerosphere [Glycopyrrolate-Formoterol] Other (See Comments)    Pt believes this caused mouth sores and thrush    Breztri Aerosphere [Budeson-Glycopyrrol-Formoterol] Other (See Comments)    Thrush   Clarithromycin Other (See Comments)    All "mycins", Puts into "a" fib, Will take if has to for severe sinus infection   Effexor [Venlafaxine] Nausea And Vomiting and Other (See Comments)    cramps   Lexapro [Escitalopram Oxalate] Other (See Comments)    hallucinations   Aciphex [Rabeprazole Sodium] Rash   Alendronate Sodium Other (See Comments)    "caused stomach problems for 3 days"   Avelox [Moxifloxacin Hcl In Nacl] Other (See Comments)    Stomach cramps.    Bextra [Valdecoxib] Rash   Ceclor [Cefaclor] Rash  Cephalexin Rash and Other (See Comments)    Pt states that  she is possibly allergic to this - had a reaction to Cefaclor in the past and she does not want to these class drugs. Added per patient request.   Covera-Hs [Verapamil Hcl] Palpitations   Dicyclomine Hcl Rash   Estradiol Other (See Comments)    Breast soreness (severe).   Other Other (See Comments)    Glue from ekg/heart monitor leads --rash, Any MYCINS   Tessalon Perles Rash    ROS   Pertinent items noted in HPI and remainder of comprehensive ROS otherwise negative.  Vitals   Vitals:   01/01/24 1141 01/01/24 1200 01/01/24 1230 01/01/24 1235  BP:  102/78  104/82  Pulse:  (!) 144 (!) 144 (!) 112  Resp:  (!) 28 19 (!) 24  Temp: 98.3 F (36.8 C)     SpO2:  92% 95% (!) 89%  Weight:      Height:       No intake or output data in the 24 hours ending 01/01/24 1355 Filed Weights   01/01/24 0916  Weight: 84.4 kg    Physical Exam   General appearance: alert, no distress, mildly obese, and pale Neck: no carotid bruit, no JVD, and thyroid not enlarged, symmetric, no tenderness/mass/nodules Lungs: diminished breath sounds bilaterally and rales bibasilar Heart: irregularly irregular rhythm Abdomen: soft, non-tender; bowel sounds normal; no masses,  no organomegaly Extremities: extremities normal, atraumatic, no cyanosis or edema Pulses: 2+ and symmetric Skin: Skin color, texture, turgor normal. No rashes or lesions Neurologic: Grossly normal Psych: Pleasant  Labs   Results for orders placed or performed during the hospital encounter of 01/01/24 (from the past 48 hours)  Basic metabolic panel     Status: Abnormal   Collection Time: 01/01/24  9:35 AM  Result Value Ref Range   Sodium 140 135 - 145 mmol/L   Potassium 4.1 3.5 - 5.1 mmol/L   Chloride 100 98 - 111 mmol/L   CO2 27 22 - 32 mmol/L   Glucose, Bld 128 (H) 70 - 99 mg/dL    Comment: Glucose reference range applies only to samples taken after fasting for at least 8 hours.   BUN 13 8 - 23 mg/dL   Creatinine, Ser 0.63  0.44 - 1.00 mg/dL   Calcium 01.6 8.9 - 01.0 mg/dL   GFR, Estimated >93 >23 mL/min    Comment: (NOTE) Calculated using the CKD-EPI Creatinine Equation (2021)    Anion gap 13 5 - 15    Comment: Performed at [facility], [address], [place], [place] [address]  Protime-INR- (order if Patient is taking Coumadin / Warfarin)     Status: Abnormal   Collection Time: 01/01/24  9:35 AM  Result Value Ref Range   Prothrombin Time 24.9 (H) 11.4 - 15.2 seconds   INR 2.2 (H) 0.8 - 1.2    Comment: (NOTE) INR goal varies based on device and disease states. Performed at [facility], [address], [place], [place] [address]   Brain natriuretic peptide     Status: Abnormal   Collection Time: 01/01/24  9:35 AM  Result Value Ref Range   B Natriuretic Peptide 219.3 (H) 0.0 - 100.0 pg/mL    Comment: Performed at [facility], [address] [place], [place] [address]  Troponin I (High Sensitivity)     Status: None   Collection Time: 01/01/24  9:35 AM  Result Value Ref Range  Troponin I (High Sensitivity) 9 <18 ng/L    Comment: (NOTE) Elevated high sensitivity troponin I (hsTnI) values and significant  changes across serial measurements may suggest ACS but many other  chronic and acute conditions are known to elevate hsTnI results.  Refer to the "Links" section for chest pain algorithms and additional  guidance. Performed at [facility], [address] [place], [place] [address]   CBC with Differential     Status: None   Collection Time: 01/01/24  9:35 AM  Result Value Ref Range   WBC 8.5 4.0 - 10.5 K/uL   RBC 4.59 3.87 - 5.11 MIL/uL   Hemoglobin 14.1 12.0 - 15.0 g/dL   HCT 13.0 86.5 - 78.4 %   MCV 92.8 80.0 - 100.0 fL   MCH 30.7 26.0 - 34.0 pg   MCHC 33.1 30.0 - 36.0 g/dL   RDW 69.6 29.5 - 28.4 %   Platelets 238 150 - 400 K/uL   nRBC 0.0 0.0 - 0.2 %   Neutrophils Relative % 72 %   Neutro Abs 6.1 1.7 - 7.7 K/uL   Lymphocytes Relative 15 %   Lymphs Abs 1.3  0.7 - 4.0 K/uL   Monocytes Relative 10 %   Monocytes Absolute 0.9 0.1 - 1.0 K/uL   Eosinophils Relative 2 %   Eosinophils Absolute 0.1 0.0 - 0.5 K/uL   Basophils Relative 0 %   Basophils Absolute 0.0 0.0 - 0.1 K/uL   Immature Granulocytes 1 %   Abs Immature Granulocytes 0.04 0.00 - 0.07 K/uL    Comment: Performed at [facility], [address] [place], [place] [address]  Resp panel by RT-PCR (RSV, Flu A&B, Covid) Anterior Nasal Swab     Status: None   Collection Time: 01/01/24  9:37 AM   Specimen: Anterior Nasal Swab  Result Value Ref Range   SARS Coronavirus 2 by RT PCR NEGATIVE NEGATIVE   Influenza A by PCR NEGATIVE NEGATIVE   Influenza B by PCR NEGATIVE NEGATIVE    Comment: (NOTE) The Xpert Xpress SARS-CoV-2/FLU/RSV plus assay is intended as an aid in the diagnosis of influenza from Nasopharyngeal swab specimens and should not be used as a sole basis for treatment. Nasal washings and aspirates are unacceptable for Xpert Xpress SARS-CoV-2/FLU/RSV testing.  Fact Sheet for Patients: BloggerCourse.com  Fact Sheet for Healthcare Providers: SeriousBroker.it  This test is not yet approved or cleared by the [place] FDA and has been authorized for detection and/or diagnosis of SARS-CoV-2 by FDA under an Emergency Use Authorization (EUA). This EUA will remain in effect (meaning this test can be used) for the duration of the COVID-19 declaration under Section 564(b)(1) of the Act, 21 U.S.C. section 360bbb-3(b)(1), unless the authorization is terminated or revoked.     Resp Syncytial Virus by PCR NEGATIVE NEGATIVE    Comment: (NOTE) Fact Sheet for Patients: BloggerCourse.com  Fact Sheet for Healthcare Providers: SeriousBroker.it  This test is not yet approved or cleared by the [place] FDA and has been authorized for detection and/or diagnosis of SARS-CoV-2  by FDA under an Emergency Use Authorization (EUA). This EUA will remain in effect (meaning this test can be used) for the duration of the COVID-19 declaration under Section 564(b)(1) of the Act, 21 U.S.C. section 360bbb-3(b)(1), unless the authorization is terminated or revoked.  Performed at [facility], [address], [place], [place] [address]    *Note: Due to a large number of results and/or encounters for  the requested time period, some results have not been displayed. A complete set of results can be found in Results Review.    ECG   Afib with rate of 95 - Personally Reviewed  Telemetry   AFib with CVR - Personally Reviewed  Radiology   DG Chest Port 1 View Result Date: 01/01/2024 CLINICAL DATA:  Atrial fibrillation. Pain with breathing. Unproductive cough. EXAM: PORTABLE CHEST 1 VIEW COMPARISON:  08/18/2023. FINDINGS: Cardiac enlargement. No signs of pleural effusion or edema. Chronic coarsened interstitial changes of COPD/emphysema. No superimposed airspace consolidation. IMPRESSION: 1. No acute findings. 2. COPD/emphysema. Electronically Signed   By: Signa Kell M.D.   On: 01/01/2024 10:21    Cardiac Studies   N/A  Impression   Principal Problem:   Paroxysmal atrial fibrillation with RVR (HCC) Acute on chronic heart failure, unspecified  Recommendation   Ms. Booze has had an acute episode of atrial fibrillation with rapid ventricular response lasting for about 2 weeks.  Heart rates have been elevated.  She has a history of systolic dysfunction I am concerned she may have developed recurrent systolic heart failure.  BNP is mildly elevated.  She does report some shortness of breath.  Heart rate is now better managed on diltiazem.  Will get another echo and trend her troponins.  She reported some chest pressure but this may be just rate related.  She does not appear to be overtly volume overloaded however if her LVEF is down would consider some diuresis.  She  is on 2 L of oxygen at home for chronic lung disease. Continue diltiazem overnight- she may convert to sinus rhythm, otherwise we can switch to oral treatment tomorrow. Would restart home heart failure meds as bp allows.   Thanks for the consultation. Cardiology will follow with you.  Time Spent Directly with Patient:  I have spent a total of 45 minutes with the patient reviewing hospital notes, telemetry, EKGs, labs and examining the patient as well as establishing an assessment and plan that was discussed personally with the patient.  > 50% of time was spent in direct patient care.  Length of Stay:  LOS: 0 days   Chrystie Nose, MD, [facility], FACP  [place]  [facility]  Medical Director of the Advanced Lipid Disorders &  Cardiovascular Risk Reduction Clinic Diplomate of the American Board of Clinical Lipidology Attending Cardiologist  Direct Dial: [phone]  Fax: [phone]  Website:  [email or web address]   Laurie Horton 01/01/2024, 1:55 PM

## 2024-01-02 ENCOUNTER — Observation Stay (HOSPITAL_BASED_OUTPATIENT_CLINIC_OR_DEPARTMENT_OTHER)

## 2024-01-02 DIAGNOSIS — I480 Paroxysmal atrial fibrillation: Secondary | ICD-10-CM

## 2024-01-02 DIAGNOSIS — I5032 Chronic diastolic (congestive) heart failure: Secondary | ICD-10-CM | POA: Diagnosis not present

## 2024-01-02 LAB — CBC
HCT: 37.7 % (ref 36.0–46.0)
Hemoglobin: 12.6 g/dL (ref 12.0–15.0)
MCH: 31.2 pg (ref 26.0–34.0)
MCHC: 33.4 g/dL (ref 30.0–36.0)
MCV: 93.3 fL (ref 80.0–100.0)
Platelets: 163 10*3/uL (ref 150–400)
RBC: 4.04 MIL/uL (ref 3.87–5.11)
RDW: 13.2 % (ref 11.5–15.5)
WBC: 5 10*3/uL (ref 4.0–10.5)
nRBC: 0 % (ref 0.0–0.2)

## 2024-01-02 LAB — BASIC METABOLIC PANEL
Anion gap: 14 (ref 5–15)
BUN: 20 mg/dL (ref 8–23)
CO2: 26 mmol/L (ref 22–32)
Calcium: 9.7 mg/dL (ref 8.9–10.3)
Chloride: 96 mmol/L — ABNORMAL LOW (ref 98–111)
Creatinine, Ser: 1.13 mg/dL — ABNORMAL HIGH (ref 0.44–1.00)
GFR, Estimated: 50 mL/min — ABNORMAL LOW (ref >60)
Glucose, Bld: 98 mg/dL (ref 70–99)
Potassium: 4.4 mmol/L (ref 3.5–5.1)
Sodium: 136 mmol/L (ref 135–145)

## 2024-01-02 LAB — ECHOCARDIOGRAM COMPLETE
Height: 64 in
S' Lateral: 2.9 cm
Weight: 3009.6 [oz_av]

## 2024-01-02 LAB — PROTIME-INR
INR: 2.5 — ABNORMAL HIGH (ref 0.8–1.2)
Prothrombin Time: 27 s — ABNORMAL HIGH (ref 11.4–15.2)

## 2024-01-02 MED ORDER — LEVALBUTEROL HCL 0.63 MG/3ML IN NEBU: 0.6300 mg | INHALATION_SOLUTION | Freq: Four times a day (QID) | RESPIRATORY_TRACT | Status: DC | PRN

## 2024-01-02 MED ORDER — DILTIAZEM HCL ER COATED BEADS 120 MG PO CP24
120.0000 mg | ORAL_CAPSULE | Freq: Every day | ORAL | Status: DC
  Administered 2024-01-02 – 2024-01-03 (×2): 120 mg via ORAL
  Filled 2024-01-02 (×2): qty 1

## 2024-01-02 MED ORDER — MAGNESIUM SULFATE 2 GM/50ML IV SOLN
2.0000 g | Freq: Once | INTRAVENOUS | Status: AC
  Administered 2024-01-02: 2 g via INTRAVENOUS
  Filled 2024-01-02: qty 50

## 2024-01-02 NOTE — Progress Notes (Signed)
 [facility] PROGRESS NOTE  Laurie Horton (DOB: 05/27/1946) WJX:[id number] PCP: Georgianne Fick, MD  Brief Narrative: Laurie Horton is a 78 y.o. female with a history of HFpEF, HTN, PAF on coumadin, diastolic CHF, PFO, prior CVA, and COPD on 2 L of oxygen, osteoporosis, scoliosis, and fibromyalgia who presented to the ED on 01/01/2024 with dizziness and chest pain found to be in AFib with RVR. Admitted on diltiazem infusion with cardiology consultation.   Subjective: Heavy chest, still lightheaded when getting up.   Objective: BP (P) 99/69 (BP Location: Right Arm)   Pulse (P) 99   Temp (P) 98 F (36.7 C) (Oral)   Resp (P) 14   Ht 5\' 4"  (1.626 m)   Wt 85.3 kg   SpO2 (P) 98%   BMI 32.29 kg/m   Gen: No distress sitting EOB Pulm: Clear, nonlabored  CV: Irreg irreg, rate in 100's, no significant edema GI: Soft, NT, ND, +BS  Neuro: Alert and oriented. No new focal deficits. Ext: Warm, no deformities, tender to palpation without visible or palpable abnormality.  Skin: No new rashes, lesions or ulcers on visualized skin   Assessment & Plan: PAF with RVR: Complicated by hypotension. Rate better controlled now. TSH 1.394 - Continue diltiazem (LVEF preserved), converted to 120mg  po daily per cardiology.  - Pt's coreg was held, currently BP is soft, could consider metoprolol instead, but will defer this to cardiology.  - NPO p MN for consideration of DCCV 3/3. Therapeutic INR, continue coumadin.   Acute on chronic HFpEF, HTN: BNP 219.3.    - Repeat echo pending.  - Lasix, spironolactone per cardiology. Monitor Cr in AM.   CAD: With LHC showing OM1 and OM2 and RCA obstruction with collaterals 2018. Troponin this admission 9 > 9.  - Continue plavix, holding beta blocker for now due to hypotension.  Chronic hypoxic respiratory failure due to COPD/asthma: No exacerbation currently noted.  - Continue 2L O2 at baseline and monitor, continue home singulair. Can give prn xopenex.    GAD, neuropathy, fibromyalgia:  - Continue desvenlafaxine, gabapentin, trazodone, hydrocodone.  HLD:  - Continue outpatient repatha.   Tyrone Nine, MD [facility] [email or web address] 01/02/2024, 2:08 PM

## 2024-01-02 NOTE — Progress Notes (Signed)
 DAILY PROGRESS NOTE   Patient Name: Laurie Horton Date of Encounter: 01/02/2024 Cardiologist: Peter Swaziland, MD  Chief Complaint   No complaints  Patient Profile   78 yo female patient of Dr. Swaziland with a history of atrial fibrillation and chronic systolic heart failure with LVEF as low as 25 to 30% recently normalized by echo in 2023.  Now presents with A-fib and RVR.   Subjective   Afib is rate-controlled- echo is pending today. BNP was minimally elevated at 219. Creatinine up at 1.13 today. INR 2.5. Started on lasix 40 mg daily. Said she was not given her carvedilol  Objective   Vitals:   01/02/24 0000 01/02/24 0400 01/02/24 0544 01/02/24 0758  BP: 106/62 117/71    Pulse: 81 82  100  Resp: 14 16  19   Temp: 97.6 F (36.4 C) 97.8 F (36.6 C)  98 F (36.7 C)  TempSrc: Oral Oral  Oral  SpO2: 92% 93% 95%   Weight:      Height:        Intake/Output Summary (Last 24 hours) at 01/02/2024 0950 Last data filed at 01/02/2024 0946 Gross per 24 hour  Intake 540 ml  Output 525 ml  Net 15 ml   Filed Weights   01/01/24 0916 01/01/24 1604  Weight: 84.4 kg 85.3 kg    Physical Exam   General appearance: alert and no distress Lungs: clear to auscultation bilaterally Heart: irregularly irregular rhythm Extremities: extremities normal, atraumatic, no cyanosis or edema Neurologic: Grossly normal  Inpatient Medications    Scheduled Meds:  clopidogrel  75 mg Oral Daily   desvenlafaxine  25 mg Oral Daily   furosemide  40 mg Oral Daily   HYDROcodone-acetaminophen  1 tablet Oral BID   montelukast  10 mg Oral Daily   pantoprazole  40 mg Oral Daily   sodium chloride flush  3 mL Intravenous Q12H   spironolactone  25 mg Oral Daily   traZODone  100 mg Oral QHS   warfarin  2.5 mg Oral Once per day on Sunday Tuesday Wednesday Thursday Saturday   [START ON 01/03/2024] warfarin  5 mg Oral Once per day on Monday Friday   Warfarin - Pharmacist Dosing Inpatient   Does not apply  q1600    Continuous Infusions:  diltiazem (CARDIZEM) infusion 5 mg/hr (01/02/24 0950)    PRN Meds: acetaminophen **OR** acetaminophen, albuterol, gabapentin   Labs   Results for orders placed or performed during the hospital encounter of 01/01/24 (from the past 48 hours)  Basic metabolic panel     Status: Abnormal   Collection Time: 01/01/24  9:35 AM  Result Value Ref Range   Sodium 140 135 - 145 mmol/L   Potassium 4.1 3.5 - 5.1 mmol/L   Chloride 100 98 - 111 mmol/L   CO2 27 22 - 32 mmol/L   Glucose, Bld 128 (H) 70 - 99 mg/dL    Comment: Glucose reference range applies only to samples taken after fasting for at least 8 hours.   BUN 13 8 - 23 mg/dL   Creatinine, Ser 4.09 0.44 - 1.00 mg/dL   Calcium 81.1 8.9 - 91.4 mg/dL   GFR, Estimated >78 >29 mL/min    Comment: (NOTE) Calculated using the CKD-EPI Creatinine Equation (2021)    Anion gap 13 5 - 15    Comment: Performed at [facility], [address] [place], [place] [address]  Protime-INR- (order if Patient is taking Coumadin / Warfarin)  Status: Abnormal   Collection Time: 01/01/24  9:35 AM  Result Value Ref Range   Prothrombin Time 24.9 (H) 11.4 - 15.2 seconds   INR 2.2 (H) 0.8 - 1.2    Comment: (NOTE) INR goal varies based on device and disease states. Performed at [facility], [address], [place], [place] [address]   Brain natriuretic peptide     Status: Abnormal   Collection Time: 01/01/24  9:35 AM  Result Value Ref Range   B Natriuretic Peptide 219.3 (H) 0.0 - 100.0 pg/mL    Comment: Performed at [facility], [address] [place], [place] [address]  Troponin I (High Sensitivity)     Status: None   Collection Time: 01/01/24  9:35 AM  Result Value Ref Range   Troponin I (High Sensitivity) 9 <18 ng/L    Comment: (NOTE) Elevated high sensitivity troponin I (hsTnI) values and significant  changes across serial measurements may suggest ACS but many other  chronic and acute conditions  are known to elevate hsTnI results.  Refer to the "Links" section for chest pain algorithms and additional  guidance. Performed at [facility], [address] [place], [place] [address]   CBC with Differential     Status: None   Collection Time: 01/01/24  9:35 AM  Result Value Ref Range   WBC 8.5 4.0 - 10.5 K/uL   RBC 4.59 3.87 - 5.11 MIL/uL   Hemoglobin 14.1 12.0 - 15.0 g/dL   HCT 25.9 56.3 - 87.5 %   MCV 92.8 80.0 - 100.0 fL   MCH 30.7 26.0 - 34.0 pg   MCHC 33.1 30.0 - 36.0 g/dL   RDW 64.3 32.9 - 51.8 %   Platelets 238 150 - 400 K/uL   nRBC 0.0 0.0 - 0.2 %   Neutrophils Relative % 72 %   Neutro Abs 6.1 1.7 - 7.7 K/uL   Lymphocytes Relative 15 %   Lymphs Abs 1.3 0.7 - 4.0 K/uL   Monocytes Relative 10 %   Monocytes Absolute 0.9 0.1 - 1.0 K/uL   Eosinophils Relative 2 %   Eosinophils Absolute 0.1 0.0 - 0.5 K/uL   Basophils Relative 0 %   Basophils Absolute 0.0 0.0 - 0.1 K/uL   Immature Granulocytes 1 %   Abs Immature Granulocytes 0.04 0.00 - 0.07 K/uL    Comment: Performed at [facility], [address] [place], [place] [address]  TSH     Status: None   Collection Time: 01/01/24  9:35 AM  Result Value Ref Range   TSH 1.394 0.350 - 4.500 uIU/mL    Comment: Performed by a 3rd Generation assay with a functional sensitivity of <=0.01 uIU/mL. Performed at [facility], [address], [place], [place] [address]   Resp panel by RT-PCR (RSV, Flu A&B, Covid) Anterior Nasal Swab     Status: None   Collection Time: 01/01/24  9:37 AM   Specimen: Anterior Nasal Swab  Result Value Ref Range   SARS Coronavirus 2 by RT PCR NEGATIVE NEGATIVE   Influenza A by PCR NEGATIVE NEGATIVE   Influenza B by PCR NEGATIVE NEGATIVE    Comment: (NOTE) The Xpert Xpress SARS-CoV-2/FLU/RSV plus assay is intended as an aid in the diagnosis of influenza from Nasopharyngeal swab specimens and should not be used as a sole basis for treatment. Nasal washings and aspirates are  unacceptable for Xpert Xpress SARS-CoV-2/FLU/RSV testing.  Fact Sheet for Patients: BloggerCourse.com  Fact Sheet for Healthcare Providers: SeriousBroker.it  This test is not yet approved or cleared by the [place] FDA and has been authorized for detection and/or diagnosis of SARS-CoV-2 by FDA under an Emergency Use Authorization (EUA). This EUA will remain in effect (meaning this test can be used) for the duration of the COVID-19 declaration under Section 564(b)(1) of the Act, 21 U.S.C. section 360bbb-3(b)(1), unless the authorization is terminated or revoked.     Resp Syncytial Virus by PCR NEGATIVE NEGATIVE    Comment: (NOTE) Fact Sheet for Patients: BloggerCourse.com  Fact Sheet for Healthcare Providers: SeriousBroker.it  This test is not yet approved or cleared by the [place] FDA and has been authorized for detection and/or diagnosis of SARS-CoV-2 by FDA under an Emergency Use Authorization (EUA). This EUA will remain in effect (meaning this test can be used) for the duration of the COVID-19 declaration under Section 564(b)(1) of the Act, 21 U.S.C. section 360bbb-3(b)(1), unless the authorization is terminated or revoked.  Performed at [facility], [address] [place], [place] [address]   Magnesium     Status: None   Collection Time: 01/01/24  4:06 PM  Result Value Ref Range   Magnesium 1.8 1.7 - 2.4 mg/dL    Comment: Performed at [facility], [address] [place], [place] [address]  Troponin I (High Sensitivity)     Status: None   Collection Time: 01/01/24  4:06 PM  Result Value Ref Range   Troponin I (High Sensitivity) 9 <18 ng/L    Comment: (NOTE) Elevated high sensitivity troponin I (hsTnI) values and significant  changes across serial measurements may suggest ACS but many other  chronic and acute conditions are known to elevate  hsTnI results.  Refer to the "Links" section for chest pain algorithms and additional  guidance. Performed at [facility], [address] [place], [place] [address]   CBC     Status: None   Collection Time: 01/02/24  3:49 AM  Result Value Ref Range   WBC 5.0 4.0 - 10.5 K/uL   RBC 4.04 3.87 - 5.11 MIL/uL   Hemoglobin 12.6 12.0 - 15.0 g/dL   HCT 91.4 78.2 - 95.6 %   MCV 93.3 80.0 - 100.0 fL   MCH 31.2 26.0 - 34.0 pg   MCHC 33.4 30.0 - 36.0 g/dL   RDW 21.3 08.6 - 57.8 %   Platelets 163 150 - 400 K/uL   nRBC 0.0 0.0 - 0.2 %    Comment: Performed at [facility], [address] [place], [place] [address]  Basic metabolic panel     Status: Abnormal   Collection Time: 01/02/24  3:49 AM  Result Value Ref Range   Sodium 136 135 - 145 mmol/L   Potassium 4.4 3.5 - 5.1 mmol/L   Chloride 96 (L) 98 - 111 mmol/L   CO2 26 22 - 32 mmol/L   Glucose, Bld 98 70 - 99 mg/dL    Comment: Glucose reference range applies only to samples taken after fasting for at least 8 hours.   BUN 20 8 - 23 mg/dL   Creatinine, Ser 9.52 (H) 0.44 - 1.00 mg/dL   Calcium 9.7 8.9 - 84.1 mg/dL   GFR, Estimated 50 (L) >60 mL/min    Comment: (NOTE) Calculated using the CKD-EPI Creatinine Equation (2021)    Anion gap 14 5 - 15    Comment: Performed at [facility], [address]  [place], [place] [address]  Protime-INR     Status: Abnormal   Collection Time: 01/02/24  3:49 AM  Result Value Ref Range   Prothrombin Time 27.0 (H) 11.4 - 15.2 seconds   INR 2.5 (H) 0.8 - 1.2    Comment: (NOTE) INR goal varies based on device and disease states. Performed at [facility], [address] [place], [place] [address]    *Note: Due to a large number of results and/or encounters for the requested time period, some results have not been displayed. A complete set of results can be found in Results Review.    ECG   N/A  Telemetry   Afib with CVR - Personally Reviewed  Radiology    DG Chest  Port 1 View Result Date: 01/01/2024 CLINICAL DATA:  Atrial fibrillation. Pain with breathing. Unproductive cough. EXAM: PORTABLE CHEST 1 VIEW COMPARISON:  08/18/2023. FINDINGS: Cardiac enlargement. No signs of pleural effusion or edema. Chronic coarsened interstitial changes of COPD/emphysema. No superimposed airspace consolidation. IMPRESSION: 1. No acute findings. 2. COPD/emphysema. Electronically Signed   By: Signa Kell M.D.   On: 01/01/2024 10:21    Cardiac Studies   Echo pending today  Assessment   Principal Problem:   Paroxysmal atrial fibrillation with RVR (HCC) Active Problems:   Essential hypertension   Chronic respiratory failure with hypoxia (HCC)   Pure hypercholesterolemia   Generalized anxiety disorder   COPD, moderate (HCC)   Coronary artery disease of native artery of native heart with stable angina pectoris (HCC)   Heart failure with preserved ejection fraction (HCC)   Plan   Rate-controlled afib - will switch to oral diltiazem. Carvedilol not restarted, possibly d/t blood pressure. Echo pending, however, prelim report is that LV function is grossly preserved. Since she remains in afib, may benefit from inpatient DCCV prior to d/c- INR has been therapeutic.   Time Spent Directly with Patient:  I have spent a total of 25 minutes with the patient reviewing hospital notes, telemetry, EKGs, labs and examining the patient as well as establishing an assessment and plan that was discussed personally with the patient.  > 50% of time was spent in direct patient care.  Length of Stay:  LOS: 0 days   Chrystie Nose, MD, [facility], FACP  [place]  [facility]  Medical Director of the [facility]  [facility] Diplomate of the American Board of Clinical Lipidology Attending Cardiologist  Direct Dial: [phone]  Fax: [phone]  Website:  [email or web address] Nicely Jarrid Lienhard 01/02/2024, 9:50 AM

## 2024-01-02 NOTE — Progress Notes (Signed)
 ANTICOAGULATION CONSULT NOTE  Pharmacy Consult for Warfarin Indication: atrial fibrillation  Allergies  Allergen Reactions   Bee Venom Anaphylaxis   Iodine Anaphylaxis and Swelling    Throat swelling   Sudafed [Pseudoephedrine] Shortness Of Breath and Palpitations   Tessalon [Benzonatate] Rash   Xanax [Alprazolam] Anaphylaxis and Other (See Comments)    Respiratory arrest   Budesonide-Formoterol Fumarate Other (See Comments)    Blisters inside of mouth all over   Crestor [Rosuvastatin Calcium] Other (See Comments)    Severe leg weakness Unable to walk   Flonase [Fluticasone Propionate] Other (See Comments)    Epistaxis    Lamictal [Lamotrigine] Diarrhea, Rash and Other (See Comments)    Difficulty breathing   Lotrimin [Clotrimazole] Other (See Comments)    Mouth blisters   Lunesta [Eszopiclone] Other (See Comments)    REACTION: "slept for a week"   Oxcarbazepine Other (See Comments)    Causes deep sleep and dizziness   Statins Other (See Comments)    Severe leg weakness Unable to walk   Ambien [Zolpidem Tartrate] Other (See Comments)    "Slept for a week"   Betadine [Povidone Iodine] Other (See Comments)    Breathing problems   Bevespi Aerosphere [Glycopyrrolate-Formoterol] Other (See Comments)    Pt believes this caused mouth sores and thrush    Biaxin [Clarithromycin] Other (See Comments)    All "mycins", Puts into "a" fib, Will take if has to for severe sinus infection   Breztri Aerosphere [Budeson-Glycopyrrol-Formoterol] Other (See Comments)    Thrush   Claritin-D [Loratadine-Pseudoephedrine Er] Other (See Comments)    Tremors, shaking   Effexor [Venlafaxine] Nausea And Vomiting and Other (See Comments)    cramps   Lexapro [Escitalopram Oxalate] Other (See Comments)    Hallucinations    Nexium [Esomeprazole Magnesium] Other (See Comments)    Hyperactivity    Aciphex [Rabeprazole Sodium] Rash   Avelox [Moxifloxacin Hcl In Nacl] Other (See Comments)    Stomach  cramps   Bentyl [Dicyclomine] Rash   Bextra [Valdecoxib] Rash   Ceclor [Cefaclor] Rash   Covera-Hs [Verapamil Hcl] Palpitations   Dicyclomine Hcl Rash   Estrace [Estradiol] Other (See Comments)    Breast soreness (severe).   Fosamax [Alendronate Sodium] Other (See Comments)    Stomach issues   Keflex [Cephalexin] Rash and Other (See Comments)    Pt states that she is possibly allergic to this - had a reaction to Cefaclor in the past and she does not want to these class drugs. Added per patient request.   Other Other (See Comments)    Glue from ekg/heart monitor leads --rash, Any MYCINS    Patient Measurements: Height: 5\' 4"  (162.6 cm) Weight: 85.3 kg (188 lb 1.6 oz) IBW/kg (Calculated) : 54.7  Vital Signs: Temp: 97.8 F (36.6 C) (03/02 0400) Temp Source: Oral (03/02 0400) BP: 117/71 (03/02 0400) Pulse Rate: 82 (03/02 0400)  Labs: Recent Labs    12/31/23 1259 01/01/24 0935 01/01/24 1606 01/02/24 0349  HGB  --  14.1  --  12.6  HCT  --  42.6  --  37.7  PLT  --  238  --  163  LABPROT  --  24.9*  --  27.0*  INR 2.3 2.2*  --  2.5*  CREATININE  --  0.94  --  1.13*  TROPONINIHS  --  9 9  --     Estimated Creatinine Clearance: 43.3 mL/min (A) (by C-G formula based on SCr of 1.13 mg/dL (H)).   Medical  History: Past Medical History:  Diagnosis Date   Acute diastolic heart failure, NYHA class 1 (HCC) 02/08/2017   Acute heart failure (HCC) 02/08/2017   Cataract    OD   CHF (congestive heart failure) (HCC) 03/02/2017   EF 25-30% 2018   Complication of anesthesia    various issues with oxygen  saturations post op   COPD (chronic obstructive pulmonary disease) (HCC)    Depression    Depression    [employer]reesia 11/04/2020   Diverticulitis    DVT (deep venous thrombosis) (HCC)    Fibromyalgia    GERD (gastroesophageal reflux disease)    Heart murmur    [employer]reesia 11/04/2020   Hiatal hernia    History of deviated nasal septum    left- side   HTN (hypertension)     Hyperlipidemia    Hypertension    [employer]reesia 11/04/2020   Hypertensive retinopathy    OU   Myocardial infarction (HCC)    [employer]reesia 11/04/2020   Obesity    On supplemental oxygen therapy    concentrator at night @ 1.5 l/m or when sleeps. O2 Sat niormally 87.   Osteoporosis    Osteoporosis    Phreesia 11/04/2020   Oxygen deficiency    [employer]reesia 11/04/2020   PAT (paroxysmal atrial tachycardia) (HCC)    PFO (patent foramen ovale)    PULMONARY NODULE, LEFT LOWER LOBE 10/14/2009   5mm LLL nodule dec 2010. Stable and 4mm in Oct 2012. 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 Persists on cxr 07/22/11. Cleared on CT 08/24/11. No further followup   Stroke [facility])    TOBACCO ABUSE 06/04/2009    Medications:  Medications Prior to Admission  Medication Sig Dispense Refill Last Dose/Taking   acetaminophen (TYLENOL) 500 MG tablet Take 1,000 mg by mouth 2 (two) times daily as needed for moderate pain (pain score 4-6) or headache.   Past Month   carvedilol (COREG) 12.5 MG tablet TAKE 1 TABLET BY MOUTH 2 TIMES DAILY. 60 tablet 11 01/01/2024 Morning   clopidogrel (PLAVIX) 75 MG tablet TAKE 1 TABLET BY MOUTH EVERY DAY 30 tablet 11 01/01/2024 Morning   desvenlafaxine (PRISTIQ) 25 MG 24 hr tablet Take 1 tablet (25 mg total) by mouth daily. (Patient taking differently: Take 25 mg by mouth daily at 4 PM.) 30 tablet 2 12/31/2023 Evening   Evolocumab (REPATHA SURECLICK) 140 MG/ML SOAJ Inject 140 mg into the skin every 14 (fourteen) days. 6 mL 1 Past Month   furosemide (LASIX) 40 MG tablet TAKE 1 TABLET BY MOUTH EVERY DAY 90 tablet 2 01/01/2024 Morning   gabapentin (NEURONTIN) 100 MG capsule Take 200 mg by mouth 3 (three) times daily as needed (neuropathy).   Past Week   HYDROcodone-acetaminophen (NORCO/VICODIN) 5-325 MG tablet Take 1 tablet by mouth 2 (two) times daily.   12/31/2023 Bedtime   montelukast  (SINGULAIR) 10 MG tablet TAKE 1 TABLET BY MOUTH EVERYDAY AT BEDTIME (Patient taking differently: Take 10 mg by mouth daily.) 90 tablet 2 01/01/2024 Morning   nitroGLYCERIN (NITROSTAT) 0.4 MG SL tablet PLACE 1 TABLET UNDER THE TONGUE EVERY 5 MINUTES AS NEEDED FOR CHEST PAIN. 25 tablet 7 Taking   OXYGEN Inhale 2 L/min into the lungs continuous.   01/01/2024 Morning   pantoprazole (PROTONIX) 40 MG tablet TAKE 1 TABLET BY MOUTH EVERY DAY 30 tablet 5 01/01/2024 Morning   spironolactone (ALDACTONE) 25 MG tablet TAKE  1 TABLET BY MOUTH EVERY DAY 90 tablet 2 01/01/2024 Morning   traZODone (DESYREL) 100 MG tablet Take one tab daily as needed for sleep (Patient taking differently: Take 100 mg by mouth at bedtime.) 30 tablet 2 12/31/2023 Bedtime   warfarin (COUMADIN) 5 MG tablet TAKE ONE-HALF TO 1 TABLET BY MOUTH DAILY OR AS DIRECTED BY CLINIC (Patient taking differently: Take 2.5-5 mg by mouth See admin instructions. Take 1/2 tablet (2.5mg ) by mouth at 1600 on all days EXCEPT take 1 tablet (5mg ) on Monday and Friday only.) 30 tablet 3 12/31/2023 at  4:00 PM   Scheduled:   clopidogrel  75 mg Oral Daily   desvenlafaxine  25 mg Oral Daily   furosemide  40 mg Oral Daily   HYDROcodone-acetaminophen  1 tablet Oral BID   montelukast  10 mg Oral Daily   pantoprazole  40 mg Oral Daily   sodium chloride flush  3 mL Intravenous Q12H   spironolactone  25 mg Oral Daily   traZODone  100 mg Oral QHS   warfarin  2.5 mg Oral Once per day on Sunday Tuesday Wednesday Thursday Saturday   [START ON 01/03/2024] warfarin  5 mg Oral Once per day on Monday Friday   Warfarin - Pharmacist Dosing Inpatient   Does not apply q1600   Infusions:   diltiazem (CARDIZEM) infusion 5 mg/hr (01/01/24 1224)   PRN: acetaminophen **OR** acetaminophen, albuterol, gabapentin  Assessment: 47 yof with a history of AF, HF. Patient is presenting with AF RVR. Warfarin per pharmacy consult placed for atrial fibrillation.  Patient taking warfarin prior to  arrival. Home dose is 5 mg (5 mg x 1) every Mon, Fri; 2.5 mg (5 mg x 0.5) all other days. Last taken PTA 2/28 1600.  INR today is 2.5 which remains therapeutic. CBC WNL  Goal of Therapy:  INR Goal 2-3 Monitor platelets by anticoagulation protocol: Yes   Plan:  Continue home regimen of warfarin (5 mg every Mon, Fri; 2.5 mg all other days) Monitor for bleeding, daily INR, CBC Watch for new DDIs  Nicole Kindred, PharmD PGY1 Pharmacy Resident 01/02/2024 7:31 AM

## 2024-01-02 NOTE — Progress Notes (Signed)
  Echocardiogram 2D Echocardiogram has been performed.  Leda Roys RDCS 01/02/2024, 10:04 AM

## 2024-01-02 NOTE — Care Management Obs Status (Signed)
 MEDICARE OBSERVATION STATUS NOTIFICATION   Patient Details  Name: Laurie Horton MRN: [id number] Date of Birth: Sep 16, 1946   Medicare Observation Status Notification Given:  Yes    Israel Werts G., RN 01/02/2024, 8:33 AM

## 2024-01-03 ENCOUNTER — Ambulatory Visit: Payer: Medicare Other | Admitting: Neurology

## 2024-01-03 DIAGNOSIS — I480 Paroxysmal atrial fibrillation: Secondary | ICD-10-CM | POA: Diagnosis not present

## 2024-01-03 LAB — BASIC METABOLIC PANEL
Anion gap: 8 (ref 5–15)
BUN: 14 mg/dL (ref 8–23)
CO2: 32 mmol/L (ref 22–32)
Calcium: 9.5 mg/dL (ref 8.9–10.3)
Chloride: 97 mmol/L — ABNORMAL LOW (ref 98–111)
Creatinine, Ser: 0.96 mg/dL (ref 0.44–1.00)
GFR, Estimated: >60 mL/min (ref >60)
Glucose, Bld: 112 mg/dL — ABNORMAL HIGH (ref 70–99)
Potassium: 3.8 mmol/L (ref 3.5–5.1)
Sodium: 137 mmol/L (ref 135–145)

## 2024-01-03 LAB — PROTIME-INR
INR: 2.3 — ABNORMAL HIGH (ref 0.8–1.2)
Prothrombin Time: 25.5 s — ABNORMAL HIGH (ref 11.4–15.2)

## 2024-01-03 MED ORDER — POTASSIUM CHLORIDE CRYS ER 20 MEQ PO TBCR
20.0000 meq | EXTENDED_RELEASE_TABLET | Freq: Once | ORAL | Status: DC
  Filled 2024-01-03: qty 1

## 2024-01-03 MED ORDER — CARVEDILOL 3.125 MG PO TABS: 3.1250 mg | ORAL_TABLET | Freq: Two times a day (BID) | ORAL | Status: DC

## 2024-01-03 MED ORDER — SPIRONOLACTONE 12.5 MG HALF TABLET
12.5000 mg | ORAL_TABLET | Freq: Every day | ORAL | Status: DC
  Filled 2024-01-03: qty 1

## 2024-01-03 MED ORDER — DILTIAZEM HCL ER COATED BEADS 120 MG PO CP24: 120.0000 mg | ORAL_CAPSULE | Freq: Every day | ORAL | 0 refills | Status: DC

## 2024-01-03 MED ORDER — CARVEDILOL 3.125 MG PO TABS: 3.1250 mg | ORAL_TABLET | Freq: Two times a day (BID) | ORAL | 0 refills | Status: DC

## 2024-01-03 MED ORDER — SPIRONOLACTONE 25 MG PO TABS: 12.5000 mg | ORAL_TABLET | Freq: Every day | ORAL | 0 refills | Status: DC

## 2024-01-03 NOTE — Discharge Summary (Signed)
 Physician Discharge Summary   Patient: Laurie Horton MRN: [id number] DOB: November 19, 1945  Admit date:     01/01/2024  Discharge date: 01/03/2024   Discharge Physician: Tyrone Nine   PCP: Georgianne Fick, MD   Recommendations at discharge:  Follow up with PCP and other medical providers per routine, suggest repeat BMP and CBC in next 1-2 weeks.  Follow up with cardiology after discharge. Started diltiazem for rate/rhythm control, converted to NSR 3/2 prior to DCCV. Decreased coreg and spironolactone doses to avoid hypotension.   Discharge Diagnoses: Principal Problem:   Paroxysmal atrial fibrillation with RVR (HCC) Active Problems:   Heart failure with preserved ejection fraction (HCC)   Chronic respiratory failure with hypoxia (HCC)   COPD, moderate (HCC)   Essential hypertension   Coronary artery disease of native artery of native heart with stable angina pectoris (HCC)   Pure hypercholesterolemia   Generalized anxiety disorder  Hospital Course: AEMILIA Horton is a 78 y.o. female with a history of HFpEF, HTN, PAF on coumadin, diastolic CHF, PFO, prior CVA, and COPD on 2 L of oxygen, osteoporosis, scoliosis, and fibromyalgia who presented to the ED on 01/01/2024 with dizziness and chest pain found to be in AFib with RVR. Admitted on diltiazem infusion with cardiology consultation. Converted to NSR after initiation of oral diltiazem. Feels better, cleared for discharge by cardiology and medical teams on 3/3.   Assessment and Plan: PAF with RVR: Complicated by hypotension. Rate better controlled now. TSH 1.394 - Continue diltiazem (LVEF preserved), converted to 120mg  po daily per cardiology.  - Pt's coreg dose lowered 12.5mg  > 3.125mg  BID.  - Therapeutic INR, continue coumadin.    Acute on chronic HFpEF, HTN: BNP 219.3.    - Repeat echo w/preserved LVEF - Continue lasix 40mg , reduce home spironolactone per cardiology.   CAD: With LHC showing OM1 and OM2 and RCA obstruction with  collaterals 2018. Troponin this admission 9 > 9.  - Continue plavix, beta blocker   Chronic hypoxic respiratory failure due to COPD/asthma: No exacerbation currently noted.  - Continue 2L O2 at baseline and monitor, continue home singulair. Can give prn xopenex.    GAD, neuropathy, fibromyalgia:  - Continue desvenlafaxine, gabapentin, trazodone, hydrocodone.   HLD:  - Continue outpatient repatha.   Obesity, class 1: Body mass index is 32.29 kg/m.   Consultants: Cardiology Procedures performed: None  Disposition: Home Diet recommendation:  Discharge Diet Orders (From admission, onward)     Start     Ordered   01/03/24 0000  Diet - low sodium heart healthy        01/03/24 0930           DISCHARGE MEDICATION: Allergies as of 01/03/2024       Reactions   Bee Venom Anaphylaxis   Iodine Anaphylaxis, Swelling   Throat swelling   Sudafed [pseudoephedrine] Shortness Of Breath, Palpitations   Tessalon [benzonatate] Rash   Xanax [alprazolam] Anaphylaxis, Other (See Comments)   Respiratory arrest   Budesonide-formoterol Fumarate Other (See Comments)   Blisters inside of mouth all over   Crestor [rosuvastatin Calcium] Other (See Comments)   Severe leg weakness Unable to walk   Flonase [fluticasone Propionate] Other (See Comments)   Epistaxis    Lamictal [lamotrigine] Diarrhea, Rash, Other (See Comments)   Difficulty breathing   Lotrimin [clotrimazole] Other (See Comments)   Mouth blisters   Lunesta [eszopiclone] Other (See Comments)   REACTION: "slept for a week"   Oxcarbazepine Other (See Comments)  Causes deep sleep and dizziness   Statins Other (See Comments)   Severe leg weakness Unable to walk   Ambien [zolpidem Tartrate] Other (See Comments)   "Slept for a week"   Betadine [povidone Iodine] Other (See Comments)   Breathing problems   Bevespi Aerosphere [glycopyrrolate-formoterol] Other (See Comments)   Pt believes this caused mouth sores and thrush    Biaxin  [clarithromycin] Other (See Comments)   All "mycins", Puts into "a" fib, Will take if has to for severe sinus infection   Breztri Aerosphere [budeson-glycopyrrol-formoterol] Other (See Comments)   Thrush   Claritin-d [loratadine-pseudoephedrine Er] Other (See Comments)   Tremors, shaking   Effexor [venlafaxine] Nausea And Vomiting, Other (See Comments)   cramps   Lexapro [escitalopram Oxalate] Other (See Comments)   Hallucinations    Nexium [esomeprazole Magnesium] Other (See Comments)   Hyperactivity    Aciphex [rabeprazole Sodium] Rash   Avelox [moxifloxacin Hcl In Nacl] Other (See Comments)   Stomach cramps   Bentyl [dicyclomine] Rash   Bextra [valdecoxib] Rash   Ceclor [cefaclor] Rash   Covera-hs [verapamil Hcl] Palpitations   Dicyclomine Hcl Rash   Estrace [estradiol] Other (See Comments)   Breast soreness (severe).   Fosamax [alendronate Sodium] Other (See Comments)   Stomach issues   Keflex [cephalexin] Rash, Other (See Comments)   Pt states that she is possibly allergic to this - had a reaction to Cefaclor in the past and she does not want to these class drugs. Added per patient request.   Other Other (See Comments)   Glue from ekg/heart monitor leads --rash, Any MYCINS        Medication List     TAKE these medications    acetaminophen 500 MG tablet Commonly known as: TYLENOL Take 1,000 mg by mouth 2 (two) times daily as needed for moderate pain (pain score 4-6) or headache.   carvedilol 3.125 MG tablet Commonly known as: COREG Take 1 tablet (3.125 mg total) by mouth 2 (two) times daily. What changed:  medication strength how much to take   clopidogrel 75 MG tablet Commonly known as: PLAVIX TAKE 1 TABLET BY MOUTH EVERY DAY   desvenlafaxine 25 MG 24 hr tablet Commonly known as: Pristiq Take 1 tablet (25 mg total) by mouth daily. What changed: when to take this   diltiazem 120 MG 24 hr capsule Commonly known as: CARDIZEM CD Take 1 capsule (120 mg  total) by mouth daily.   furosemide 40 MG tablet Commonly known as: LASIX TAKE 1 TABLET BY MOUTH EVERY DAY   gabapentin 100 MG capsule Commonly known as: NEURONTIN Take 200 mg by mouth 3 (three) times daily as needed (neuropathy).   HYDROcodone-acetaminophen 5-325 MG tablet Commonly known as: NORCO/VICODIN Take 1 tablet by mouth 2 (two) times daily.   montelukast 10 MG tablet Commonly known as: SINGULAIR TAKE 1 TABLET BY MOUTH EVERYDAY AT BEDTIME What changed:  how much to take how to take this when to take this additional instructions   nitroGLYCERIN 0.4 MG SL tablet Commonly known as: NITROSTAT PLACE 1 TABLET UNDER THE TONGUE EVERY 5 MINUTES AS NEEDED FOR CHEST PAIN.   OXYGEN Inhale 2 L/min into the lungs continuous.   pantoprazole 40 MG tablet Commonly known as: PROTONIX TAKE 1 TABLET BY MOUTH EVERY DAY   Repatha SureClick 140 MG/ML Soaj Generic drug: Evolocumab Inject 140 mg into the skin every 14 (fourteen) days.   spironolactone 25 MG tablet Commonly known as: ALDACTONE Take 0.5 tablets (12.5 mg  total) by mouth daily. What changed: how much to take   traZODone 100 MG tablet Commonly known as: DESYREL Take one tab daily as needed for sleep What changed:  how much to take how to take this when to take this additional instructions   warfarin 5 MG tablet Commonly known as: COUMADIN Take as directed. If you are unsure how to take this medication, talk to your nurse or doctor. Original instructions: TAKE ONE-HALF TO 1 TABLET BY MOUTH DAILY OR AS DIRECTED BY CLINIC What changed: See the new instructions.        Follow-up Information     Georgianne Fick, MD Follow up.   Specialty: Internal Medicine Contact information: [address] [place] [address] [place] [address] [phone]                Discharge Exam: Ceasar Mons Weights   01/01/24 0916 01/01/24 1604  Weight: 84.4 kg 85.3 kg  BP 130/60 (BP Location: Right Arm)   Pulse 68    Temp 98.1 F (36.7 C) (Oral)   Resp 16   Ht 5\' 4"  (1.626 m)   Wt 85.3 kg   SpO2 100%   BMI 32.29 kg/m   Elderly pleasant female in no distress. Reports dry throat due to mouth breathing overnight, happens at home.  Clear, nonlabored Regular rate and rhythm  Condition at discharge: stable  The results of significant diagnostics from this hospitalization (including imaging, microbiology, ancillary and laboratory) are listed below for reference.   Imaging Studies: ECHOCARDIOGRAM COMPLETE Result Date: 01/02/2024    ECHOCARDIOGRAM REPORT   Patient Name:   MAMYE BOLDS Date of Exam: 01/02/2024 Medical Rec #:  [id number]         Height:       64.0 in Accession #:    [id number]        Weight:       188.1 lb Date of Birth:  05-02-1946          BSA:          1.906 m Patient Age:    78 years          BP:           110/72 mmHg Patient Gender: F                 HR:           107 bpm. Exam Location:  Inpatient Procedure: 2D Echo, Cardiac Doppler and Color Doppler (Both Spectral and Color            Flow Doppler were utilized during procedure). Indications:    Afib  History:        Patient has prior history of Echocardiogram examinations, most                 recent 12/06/2021. CAD.  Sonographer:    Harriette Bouillon RDCS Referring Phys: [phone] KENNETH C HILTY IMPRESSIONS  1. Left ventricular ejection fraction, by estimation, is 65 to 70%. The left ventricle has normal function. The left ventricle has no regional wall motion abnormalities. There is mild left ventricular hypertrophy. Left ventricular diastolic function could not be evaluated.  2. Right ventricular systolic function is normal. The right ventricular size is normal.  3. Left atrial size was mildly dilated.  4. The mitral valve is abnormal. Trivial mitral valve regurgitation.  5. The aortic valve is tricuspid. Aortic valve regurgitation is not visualized. Aortic valve sclerosis/calcification is present, without any evidence of aortic stenosis.  6. Aortic  dilatation noted. There is mild dilatation of the ascending aorta, measuring 41 mm.  7. The inferior vena cava is normal in size with greater than 50% respiratory variability, suggesting right atrial pressure of 3 mmHg. Comparison(s): Changes from prior study are noted. 12/06/2021: LVEF 55-60%, mild LAE. FINDINGS  Left Ventricle: Left ventricular ejection fraction, by estimation, is 65 to 70%. The left ventricle has normal function. The left ventricle has no regional wall motion abnormalities. Strain imaging was not performed. The left ventricular internal cavity  size was normal in size. There is mild left ventricular hypertrophy. Left ventricular diastolic function could not be evaluated due to atrial fibrillation. Left ventricular diastolic function could not be evaluated. Right Ventricle: The right ventricular size is normal. No increase in right ventricular wall thickness. Right ventricular systolic function is normal. Left Atrium: Left atrial size was mildly dilated. Right Atrium: Right atrial size was normal in size. Pericardium: There is no evidence of pericardial effusion. Mitral Valve: The mitral valve is abnormal. Mild mitral annular calcification. Trivial mitral valve regurgitation. Tricuspid Valve: The tricuspid valve is grossly normal. Tricuspid valve regurgitation is trivial. Aortic Valve: The aortic valve is tricuspid. Aortic valve regurgitation is not visualized. Aortic valve sclerosis/calcification is present, without any evidence of aortic stenosis. Pulmonic Valve: The pulmonic valve was grossly normal. Pulmonic valve regurgitation is trivial. Aorta: Aortic dilatation noted. There is mild dilatation of the ascending aorta, measuring 41 mm. Venous: The inferior vena cava is normal in size with greater than 50% respiratory variability, suggesting right atrial pressure of 3 mmHg. IAS/Shunts: No atrial level shunt detected by color flow Doppler. Additional Comments: 3D imaging was not performed.  LEFT  VENTRICLE PLAX 2D LVIDd:         4.90 cm LVIDs:         2.90 cm LV PW:         1.20 cm LV IVS:        1.20 cm LVOT diam:     2.20 cm LV SV:         54 LV SV Index:   28 LVOT Area:     3.80 cm  RIGHT VENTRICLE         IVC TAPSE (M-mode): 1.5 cm  IVC diam: 1.70 cm LEFT ATRIUM             Index        RIGHT ATRIUM           Index LA diam:        4.00 cm 2.10 cm/m   RA Area:     16.90 cm LA Vol (A2C):   61.8 ml 32.42 ml/m  RA Volume:   44.40 ml  23.29 ml/m LA Vol (A4C):   59.7 ml 31.32 ml/m LA Biplane Vol: 61.3 ml 32.16 ml/m  AORTIC VALVE LVOT Vmax:   82.00 cm/s LVOT Vmean:  58.200 cm/s LVOT VTI:    0.142 m  AORTA Ao Root diam: 3.70 cm Ao Asc diam:  4.10 cm  SHUNTS Systemic VTI:  0.14 m Systemic Diam: 2.20 cm Zoila Shutter MD Electronically signed by Zoila Shutter MD Signature Date/Time: 01/02/2024/10:34:12 AM    Final    DG Chest Port 1 View Result Date: 01/01/2024 CLINICAL DATA:  Atrial fibrillation. Pain with breathing. Unproductive cough. EXAM: PORTABLE CHEST 1 VIEW COMPARISON:  08/18/2023. FINDINGS: Cardiac enlargement. No signs of pleural effusion or edema. Chronic coarsened interstitial changes of COPD/emphysema. No superimposed airspace consolidation. IMPRESSION: 1. No acute  findings. 2. COPD/emphysema. Electronically Signed   By: Signa Kell M.D.   On: 01/01/2024 10:21    Microbiology: Results for orders placed or performed during the hospital encounter of 01/01/24  Resp panel by RT-PCR (RSV, Flu A&B, Covid) Anterior Nasal Swab     Status: None   Collection Time: 01/01/24  9:37 AM   Specimen: Anterior Nasal Swab  Result Value Ref Range Status   SARS Coronavirus 2 by RT PCR NEGATIVE NEGATIVE Final   Influenza A by PCR NEGATIVE NEGATIVE Final   Influenza B by PCR NEGATIVE NEGATIVE Final    Comment: (NOTE) The Xpert Xpress SARS-CoV-2/FLU/RSV plus assay is intended as an aid in the diagnosis of influenza from Nasopharyngeal swab specimens and should not be used as a sole basis for  treatment. Nasal washings and aspirates are unacceptable for Xpert Xpress SARS-CoV-2/FLU/RSV testing.  Fact Sheet for Patients: BloggerCourse.com  Fact Sheet for Healthcare Providers: SeriousBroker.it  This test is not yet approved or cleared by the [place] FDA and has been authorized for detection and/or diagnosis of SARS-CoV-2 by FDA under an Emergency Use Authorization (EUA). This EUA will remain in effect (meaning this test can be used) for the duration of the COVID-19 declaration under Section 564(b)(1) of the Act, 21 U.S.C. section 360bbb-3(b)(1), unless the authorization is terminated or revoked.     Resp Syncytial Virus by PCR NEGATIVE NEGATIVE Final    Comment: (NOTE) Fact Sheet for Patients: BloggerCourse.com  Fact Sheet for Healthcare Providers: SeriousBroker.it  This test is not yet approved or cleared by the [place] FDA and has been authorized for detection and/or diagnosis of SARS-CoV-2 by FDA under an Emergency Use Authorization (EUA). This EUA will remain in effect (meaning this test can be used) for the duration of the COVID-19 declaration under Section 564(b)(1) of the Act, 21 U.S.C. section 360bbb-3(b)(1), unless the authorization is terminated or revoked.  Performed at [facility], [address], [place], [place] [address]    *Note: Due to a large number of results and/or encounters for the requested time period, some results have not been displayed. A complete set of results can be found in Results Review.    Labs: CBC: Recent Labs  Lab 01/01/24 0935 01/02/24 0349  WBC 8.5 5.0  NEUTROABS 6.1  --   HGB 14.1 12.6  HCT 42.6 37.7  MCV 92.8 93.3  PLT 238 163   Basic Metabolic Panel: Recent Labs  Lab 01/01/24 0935 01/01/24 1606 01/02/24 0349 01/03/24 0504  NA 140  --  136 137  K 4.1  --  4.4 3.8  CL 100  --  96* 97*  CO2  27  --  26 32  GLUCOSE 128*  --  98 112*  BUN 13  --  20 14  CREATININE 0.94  --  1.13* 0.96  CALCIUM 10.0  --  9.7 9.5  MG  --  1.8  --   --    Liver Function Tests: No results for input(s): "AST", "ALT", "ALKPHOS", "BILITOT", "PROT", "ALBUMIN" in the last 168 hours. CBG: No results for input(s): "GLUCAP" in the last 168 hours.  Discharge time spent: greater than 30 minutes.  Signed: Tyrone Nine, MD [facility] 01/03/2024

## 2024-01-03 NOTE — Progress Notes (Addendum)
 Patient Name: JAVIER MAMONE Date of Encounter: 01/03/2024 [facility] Cardiologist: Peter [place], MD   Interval Summary  .    78 y.o. female with PMH of hypertension, paroxysmal atrial fibrillation on Coumadin, chronic systolic heart failure with recovered LVEF, prior CVA, PAD,  S/p right retinal artery occlusion, HLD, oxygen dependent COPD, fibromyalgia, PFO, who is followed by cardiology for A fib RVR.   She states she feels much better today, no chest pain or SOB. She is ambulating in the room without issue. She states her throat is hurting probably due to oxygen use. She denied fever, chills, or worsening chronic cough.    Vital Signs .    Vitals:   01/02/24 1109 01/02/24 1137 01/02/24 1920 01/03/24 0410  BP: (!) 81/70 (P) 99/69 (!) 128/90 130/60  Pulse:  (P) 99  68  Resp:  (P) 14 19 16   Temp:  (P) 98 F (36.7 C) 98 F (36.7 C) 98.1 F (36.7 C)  TempSrc:  (P) Oral Oral Oral  SpO2:  (P) 98% 100%   Weight:      Height:        Intake/Output Summary (Last 24 hours) at 01/03/2024 0835 Last data filed at 01/03/2024 0400 Gross per 24 hour  Intake 410 ml  Output 1200 ml  Net -790 ml      01/01/2024    4:04 PM 01/01/2024    9:16 AM 12/30/2023    8:49 AM  Last 3 Weights  Weight (lbs) 188 lb 1.6 oz 186 lb   Weight (kg) 85.322 kg 84.369 kg      Information is confidential and restricted. Go to Review Flowsheets to unlock data.      Telemetry/ECG    Converted from A fib to sinus rhythm 60s since 750pm on 01/02/24, PVCs and periods of trigeminy noted  - Personally Reviewed  Physical Exam .    GEN: No acute distress.   Neck: No JVD Cardiac: RRR, no murmurs, rubs, or gallops.  Respiratory: Clear to auscultation bilaterally. On [place] oxygen.  GI: Soft, nontender, non-distended  MS: No leg edema  Assessment & Plan .     Paroxysmal A fib with RVR - presented with few weeks of chest discomfort and tachycardia, found in A fib RVR - TSH WNL - Echo with LVEF 65-70%,  no RWMA, mild LVH, normal RV, mild LAE, trivial MR, aortic sclerosis, mild dilatation of the ascending aorta 41 mm.  - rate control improved on diltiazem gtt, now transitioned to PO Cardizem 120mg  daily.  - on PTA Coreg 12.5mg  BID, this has been stopped due to hypotension into 80s systolic, BP normalized today 409/81, will resume Coreg at 3.125mg  BID today  - converted from A fib to sinus rhythm since 0750 pm on 01/02/24, no indication for DCCV today  - continue PTA anticoagulation coumadin, INR therapeutic  - Follow up arranged on 01/19/24 with [facility] cardiology office   Chronic systolic heart failure with recovered LVEF HTN  - LVEF 25 to 30% back in 2018, which normalized on GDMT by 2023 Echo, LVEF 65-70% from Echo yesterday  - BNP 219 - CXR no acute finding  - no overt fluid overload symptoms  - continued on PTA Lasix 40mg  daily  - GDMT: reduce PTA Coreg from 12.5mg  bid to 3.125mg  BID, reduce spironolactone 25mg  to 12.5mg  daily, intolerant to ARB due to orthostasis in the past, consider SGLT2I outpatient/defer to primary cardiologist   CAD HLD  - Cardiac catheterization 2018: severe two-vessel  obstructive disease of the OM1 and OM 2 branches and 100% occlusion of the RCA with left-to-right collaterals.   - Hs trop negative x2 (9>9), ACS ruled out  - chest discomfort resolved after A fib rate control  - continue PTA coreg (reduced dose), plavix 75mg , and repatha   COPD PFO PAD Hx of CVA  - per primary team     For questions or updates, please contact [facility] Please consult [email or web address] for contact info under        Signed, Cyndi Bender, NP    78 year old here with atrial fibrillation RVR over the past few weeks  A-fib with RVR - Ejection fraction 70% on recent echocardiogram.  Diltiazem low-dose 120 mg a day.  Coreg 12.5 mg twice a day was what she was on however this was stopped because of hypotension with blood pressures in the 80s.  Blood pressures are back to  normal now.  We are going to resume low-dose Coreg 3.125 mg twice a day prior to discharge.  We would like her to be on some beta-blocker given her prior low ejection fraction of 25 to 30% back in 2018. - Does not need cardioversion obviously since she auto converted yesterday. - Follow-up has been arranged  Chronic systolic heart failure with now normal ejection fraction - EF 25 to 30% back in 2018 now normal - Continuing with Lasix 40 mg a day - Reduced Coreg from 12.5 down to 3.125 - Reduce spironolactone from 25 down to 12.5 - Intolerant to ARB because of orthostasis - Likely will hold off on SGLT2 inhibitor as well because of prior orthostasis.  Coronary artery disease with hyperlipidemia - Cardiac catheterization in 2018 showed severe two-vessel disease in OM2 and OM1 and 100% occlusion of the RCA with left-to-right collaterals, medical management - Continue with low-dose carvedilol beta-blocker, Plavix 75 mg, Repatha for hyperlipidemia.  Has had a history of stroke COPD peripheral arterial disease and PFO # \\Stable.  Okay for discharge.  Donato Schultz, MD

## 2024-01-03 NOTE — Progress Notes (Signed)
 Discharge instructions reviewed with pt and her family, daughters and a son n Social worker. Pt and family verbalized understanding and able to teach back.  Copy of instructions given to pt. Pt informed her scripts went to her pharmacy for pick up.  Pt d/c'd via wheelchair with belongings, with family and escorted by unit staff.    Annice Needy RN, SWOT

## 2024-01-05 ENCOUNTER — Encounter: Payer: Self-pay | Admitting: Cardiology

## 2024-01-05 MED ORDER — DILTIAZEM HCL 30 MG PO TABS: 30.0000 mg | ORAL_TABLET | Freq: Every day | ORAL | 2 refills | Status: DC

## 2024-01-05 MED ORDER — DILTIAZEM HCL 30 MG PO TABS: 30.0000 mg | ORAL_TABLET | Freq: Three times a day (TID) | ORAL | 2 refills | Status: AC | PRN

## 2024-01-05 MED ORDER — DILTIAZEM HCL ER COATED BEADS 180 MG PO CP24: 180.0000 mg | ORAL_CAPSULE | Freq: Every day | ORAL | 2 refills | Status: DC

## 2024-01-05 NOTE — Telephone Encounter (Signed)
 RN Spoke to DOD Dr. Antoine Poche  Dr. Antoine Poche states:   -Changed Diltiazem to 180mg  daily   -add diltiazem 30mg  every 8 hours as needed for sustained heart rate greater than 130 BPM   -If patient unable to pick up Rx tonight, can take extra 120mg  diltiazem if BP is not low and heart rate is high _________________________________________________________________________________________ Patient identification verified by 2 forms. [name], RN    Called and spoke to patient  Relayed provider recommendations  Patient states:   -she likely unable to pick up Rx tonight   -at time of call BP: 165/105 HR: 67 Advised patient not to take extra dose of 120, continue to monitor symptoms at home Reviewed ED warning signs/precautions  Reviewed Rx instruction/education  Patient verbalized understanding, no questions at this time

## 2024-01-05 NOTE — Telephone Encounter (Signed)
 Patient identification verified by 2 forms. [name], RN    Called and spoke to patient  Patient states:   -Recently in hospital 3/1 for Afib   -Today she has been in and out of Afib  -highest heart rate was 145 at 9am this morning   -checks heart rate with BP Machine  -she feels palpitations when in afib   -this morning she felt like her whole body was shaking (due to afib)   -Takes diltiazem 120mg  daily   -Takes Plavix 75mg  daily   -takes Warfarin   -noticed decreased urination today   -At time of call: 135/80 HR: 140(went back into afib during call)  Patient denies:   -SOB/difficulty breathing  -chest pressure/pain  Informed patient RN will speak to DOD Dr. Antoine Poche and call with recommendations  Patient verbalized understanding, no questions at this time

## 2024-01-06 ENCOUNTER — Encounter: Payer: Self-pay | Admitting: Cardiology

## 2024-01-06 NOTE — Telephone Encounter (Signed)
 Called and spoke to pt about her Message; Reviewed changes made by DOD on 01/05/2024 regarding Diltiazem (start 180 mg every day, then 30 mg prn q8hrs for HR >130).  Pt states she was not sure why they prescribed the 30 mg pills.  She now understands.   She has to remember to wear her oxygen while doing house work, and this is why she had felt dizzy.   She also is concerned about a 3lb weight gain overnight.  She is going to take an additional Lasix 40 mg at 2pm today (took her daily dose at 6am today).  She will get back in touch with [place] if the weight does not normalize and/or if she has any further Afib episodes.   She asked when is it okay for her to drive; she will have to drive to her upcoming office visit since she has no one else under age 73 that could bring her to the office; advised to ask the provider during her visit.   Has hospital f/u with Jarome Lamas, NP  and coumadin check on 01/19/24.

## 2024-01-17 ENCOUNTER — Encounter: Payer: Self-pay | Admitting: Neurology

## 2024-01-17 ENCOUNTER — Ambulatory Visit (INDEPENDENT_AMBULATORY_CARE_PROVIDER_SITE_OTHER): Payer: Medicare Other | Admitting: Neurology

## 2024-01-17 VITALS — BP 121/71 | HR 67 | Ht 64.0 in | Wt 188.0 lb

## 2024-01-17 DIAGNOSIS — G3184 Mild cognitive impairment, so stated: Secondary | ICD-10-CM

## 2024-01-17 DIAGNOSIS — G441 Vascular headache, not elsewhere classified: Secondary | ICD-10-CM | POA: Diagnosis not present

## 2024-01-17 DIAGNOSIS — R519 Headache, unspecified: Secondary | ICD-10-CM

## 2024-01-17 NOTE — Progress Notes (Signed)
 [facility] [address] [place]. [place] [address] [phone]       OFFICE FOLLOW UP VISIT NOTE  [name] Date of Birth:  1946/01/06 Medical Record Number:  [id number]   Referring MD: Victoriano Lain  Reason for Referral: Right eye vision loss and memory loss HPI: Initial consult 12/25/2019 : Laurie Horton is a 78 year old pleasant Caucasian lady seen today for initial office consultation visit.  History is obtained from the patient, review of electronic medical records and I personally reviewed imaging films in PACS.  Patient states she woke up on 11/21/2019 with sudden onset of painless right eye vision loss.  She had no problems with vision in the left eye.  She denied any accompanying headache or burning discomfort in the eye.  She was seen by ophthalmologist Dr. Krista Blue who referred her to Dr. Vanessa Barbara who diagnosed central retinal artery occlusion.  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  MRI of the brain showed no large vessel stenosis and only mild atheromatous irregularities and moderate stenosis of left distal vertebral artery.  MRA of the neck showed severe stenosis of left subclavian artery and 50% stenosis of right internal carotid artery.  Patient has history of chronic A. fib and is on anticoagulation with warfarin.  She had a carotid ultrasound on 08/07/2019 which showed 40 to 50% left ICA and 1 to 39% right ICA stenosis.  LDL cholesterol on 11/13/2019 was 99 mg percent and hemoglobin A1c on 03/14/2019 was 6.3.  Patient states right eye vision has not improved.  She has had no further stroke or TIA symptoms.  She however states she has noticed memory difficulties which are ongoing for about a year or so.  These are mostly involving short-term memory.  She has 2 take  notes otherwise she forgets.  She is still independent in managing most of her affairs however she is finding of memory difficulties and knowing.  She has no family stable dementia.  She has no prior history of significant head injury with loss of consciousness, seizures or prior known strokes or neurological problems. Update 05/01/2020 ; She returns for follow-up after last visit 4 months ago.  She states that she has had no further recurrent episodes of TIA or stroke.  She is tolerating warfarin and Plavix well without bruising or bleeding.  She continues to have short-term memory difficulties and has trouble at times remembering her medicines.  She initially started doing compensation strategies like keeping a journal but after a month or so realized she has trouble writing as it hurts her eyes and stopped doing so.  She has however been doing brain games online and finds them useful.  She underwent EEG on 01/03/2020 which showed moderate generalized slowing without epileptiform activity.  Lab work for reversible causes of memory loss on 12/25/2019 was all normal.  She has recently started using fish oil for her eyes.  She has no new complaints.  She feels her cognitive difficulties are unchanged Update 01/06/2023 : Patient returns for follow-up today upon request from her psychiatrist Dr. Lolly Mustache.  She continues to have mild cognitive difficulties with these are more or less unchanged.  She has poor short-term memory and often forgets what she started.  She has left the stove on a couple of times and blindfold.  However she has no safety issues major accidents.  She continues to live alone and is independent in all activities of daily living.  I prescribed Prevagen at last visit which she took only for a few months and it did not help and hence she stopped it.  She does do mentally challenging activities like solving crossword puzzles and playing board games regularly.  He remains on warfarin which is tolerating well  but is tired of doing regular blood checks.  Her INR tends to fluctuate.  She appears interested in participating in the [place] AF study (eliquis versus factor XI inhibitor Milvexian ) and was given information to review at home and decide in the past with family and her cardiologist Dr. Swaziland.  On cognitive testing he did quite well and scored 30/30 on Mini-Mental control meds.  Clock drawing score was 4/4 Update 01/17/2024 :She returns for follow-up after last visit a year ago.  Patient states she has been having headaches off and on for the last few weeks.  She feels these may have been related to stress from elevated blood pressure and her A-fib.  She describes the headache as mostly in the left temple and frontal regions intermittent off-and-on.  There are moderate 7/10 in intensity sometimes burning in quality sometimes pressure-like.  She has been taking hydrocodone for her back pain which seems to help.  She has tried taking extra strength Tylenol which takes the edge of the headaches but they do not completely go away.  At times and that exabyte she has some nausea and needs to lie down.  She denies any light or sound sensitivity.  She continues to stay on warfarin for her A-fib and her INR is quite stable and last checked a month ago was 2.3.  She denies any symptoms of blurred vision, vision loss, jaw claudication.  She does admit to mild scalp tenderness.  She continues to have mild short-term memory difficulties which she feels unchanged.  On Mini-Mental status exam testing today she scored 27/30.  She does admit to having significant decreased hearing and she wants to be tested for new hearing aids.  She does participate in doing memory games online every day.  She has not been socializing a lot of late as she complains of feeling dizzy all the time.  She does not limit her driving because her vision gets blurred especially in the sun.  She does have an appointment to see a retina specialist this  Friday to discuss her vision.  She is mostly independent in activities of daily living.  She continues to live alone in a two-story home.      ROS:   14 system review of systems is positive for vision loss, memory loss, cognitive difficulties, bruising and all other systems negative  PMH:  Past Medical History:  Diagnosis Date   Acute diastolic heart failure, NYHA class 1 (HCC) 02/08/2017   Acute heart failure (HCC) 02/08/2017   Cataract    OD   CHF (congestive heart failure) (HCC) 03/02/2017   EF 25-30% 2018   Complication of anesthesia    various issues with oxygen  saturations post op   COPD (chronic obstructive pulmonary disease) (HCC)    Depression    Depression    Phreesia 11/04/2020   Diverticulitis    DVT (deep venous thrombosis) (HCC)    Fibromyalgia    GERD (gastroesophageal reflux disease)    Heart murmur    Phreesia 11/04/2020   Hiatal hernia  History of deviated nasal septum    left- side   HTN (hypertension)    Hyperlipidemia    Hypertension    Phreesia 11/04/2020   Hypertensive retinopathy    OU   Myocardial infarction [facility])    Phreesia 11/04/2020   Obesity    On supplemental oxygen therapy    concentrator at night @ 1.5 l/m or when sleeps. O2 Sat niormally 87.   Osteoporosis    Osteoporosis    Phreesia 11/04/2020   Oxygen deficiency    Phreesia 11/04/2020   PAT (paroxysmal atrial tachycardia) (HCC)    PFO (patent foramen ovale)    PULMONARY NODULE, LEFT LOWER LOBE 10/14/2009   5mm LLL nodule dec 2010. Stable and 4mm in Oct 2012. 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 Persists on cxr 07/22/11. Cleared on CT 08/24/11. No further followup   Stroke [facility])    TOBACCO ABUSE 06/04/2009    Social History:  Social History   Socioeconomic History   Marital status: Divorced    Spouse name: Not on file   Number of children: 2   Years of  education: Not on file   Highest education level: Not on file  Occupational History   Occupation: disability  Tobacco Use   Smoking status: Former    Current packs/day: 0.00    Average packs/day: 3.0 packs/day for 30.0 years (90.0 ttl pk-yrs)    Types: Cigarettes    Start date: 06/02/1980    Quit date: 06/02/2010    Years since quitting: 13.6   Smokeless tobacco: Never   Tobacco comments:    QUIT IN 2011  Vaping Use   Vaping status: Never Used  Substance and Sexual Activity   Alcohol use: No    Alcohol/week: 0.0 standard drinks of alcohol   Drug use: No   Sexual activity: Yes    Partners: Male    Birth control/protection: Post-menopausal, Surgical  Other Topics Concern   Not on file  Social History Narrative   Marital status: divorced in 1978 after ten years; dating casually in 2019.      Children: 3 biological children; 3 court appointed children; 6 grandchildren; 5 gg      Lives: alone; children in [place] and [place].      Employment: retired age 50; disability for COPD, CVA at age 35.      Tobacco: former smoker; quit smoking 2011.      Alcohol: none      Exercise: walks dog four times per day; goes to pool three times per week.      ADLs: drives; no assistant devices; does have a walker.  Cleaning is limited in 2018.  Daughter helps with cleaning.  Does own grocery shopping.      Advanced Directives: YES; DNR/DNI; HCPOA: [name] Martin/daughter youngest.  Blind.               Social Drivers of Corporate investment banker Strain: Not on file  Food Insecurity: No Food Insecurity (01/02/2024)   Hunger Vital Sign    Worried About Running Out of Food in the Last Year: Never true    Ran Out of Food in the Last Year: Never true  Transportation Needs: No Transportation Needs (01/01/2024)   PRAPARE - Administrator, Civil Service (Medical): No    Lack of Transportation (Non-Medical): No  Physical Activity: Not  on file  Stress: Not on file  Social  Connections: Unknown (01/01/2024)   Social Connection and Isolation Panel [NHANES]    Frequency of Communication with Friends and Family: More than three times a week    Frequency of Social Gatherings with Friends and Family: Patient declined    Attends Religious Services: Patient declined    Database administrator or Organizations: Patient declined    Attends Banker Meetings: Patient declined    Marital Status: Patient declined  Intimate Partner Violence: Not At Risk (01/01/2024)   Humiliation, Afraid, Rape, and Kick questionnaire    Fear of Current or Ex-Partner: No    Emotionally Abused: No    Physically Abused: No    Sexually Abused: No    Medications:   Current Outpatient Medications on File Prior to Visit  Medication Sig Dispense Refill   acetaminophen (TYLENOL) 500 MG tablet Take 1,000 mg by mouth 2 (two) times daily as needed for moderate pain (pain score 4-6) or headache.     carvedilol (COREG) 3.125 MG tablet Take 1 tablet (3.125 mg total) by mouth 2 (two) times daily. 60 tablet 0   clopidogrel (PLAVIX) 75 MG tablet TAKE 1 TABLET BY MOUTH EVERY DAY 30 tablet 11   desvenlafaxine (PRISTIQ) 25 MG 24 hr tablet Take 1 tablet (25 mg total) by mouth daily. (Patient taking differently: Take 25 mg by mouth daily at 4 PM.) 30 tablet 2   diltiazem (CARDIZEM CD) 180 MG 24 hr capsule Take 1 capsule (180 mg total) by mouth daily. 30 capsule 2   diltiazem (CARDIZEM) 30 MG tablet Take 1 tablet (30 mg total) by mouth every 8 (eight) hours as needed (Heart rate greater than 130). (Patient taking differently: Take 30 mg by mouth as needed (Heart rate greater than 130).) 90 tablet 2   Evolocumab (REPATHA SURECLICK) 140 MG/ML SOAJ Inject 140 mg into the skin every 14 (fourteen) days. 6 mL 1   furosemide (LASIX) 40 MG tablet TAKE 1 TABLET BY MOUTH EVERY DAY 90 tablet 2   gabapentin (NEURONTIN) 100 MG capsule Take 200 mg by mouth as needed (neuropathy).     HYDROcodone-acetaminophen  (NORCO/VICODIN) 5-325 MG tablet Take 1 tablet by mouth 2 (two) times daily.     montelukast (SINGULAIR) 10 MG tablet TAKE 1 TABLET BY MOUTH EVERYDAY AT BEDTIME (Patient taking differently: Take 10 mg by mouth daily.) 90 tablet 2   nitroGLYCERIN (NITROSTAT) 0.4 MG SL tablet PLACE 1 TABLET UNDER THE TONGUE EVERY 5 MINUTES AS NEEDED FOR CHEST PAIN. 25 tablet 7   OXYGEN Inhale 2 L/min into the lungs continuous.     pantoprazole (PROTONIX) 40 MG tablet TAKE 1 TABLET BY MOUTH EVERY DAY 30 tablet 5   spironolactone (ALDACTONE) 25 MG tablet Take 0.5 tablets (12.5 mg total) by mouth daily. 15 tablet 0   traZODone (DESYREL) 100 MG tablet Take one tab daily as needed for sleep (Patient taking differently: Take 100 mg by mouth at bedtime.) 30 tablet 2   warfarin (COUMADIN) 5 MG tablet TAKE ONE-HALF TO 1 TABLET BY MOUTH DAILY OR AS DIRECTED BY CLINIC (Patient taking differently: Take 2.5-5 mg by mouth See admin instructions. Take 1/2 tablet (2.5mg ) by mouth at 1600 on all days EXCEPT take 1 tablet (5mg ) on Monday and Friday only.) 30 tablet 3   No current facility-administered medications on file prior to visit.    Allergies:   Allergies  Allergen Reactions   Bee Venom Anaphylaxis   Iodine  Anaphylaxis and Swelling    Throat swelling   Sudafed [Pseudoephedrine] Shortness Of Breath and Palpitations   Tessalon [Benzonatate] Rash   Xanax [Alprazolam] Anaphylaxis and Other (See Comments)    Respiratory arrest   Budesonide-Formoterol Fumarate Other (See Comments)    Blisters inside of mouth all over   Crestor [Rosuvastatin Calcium] Other (See Comments)    Severe leg weakness Unable to walk   Flonase [Fluticasone Propionate] Other (See Comments)    Epistaxis    Lamictal [Lamotrigine] Diarrhea, Rash and Other (See Comments)    Difficulty breathing   Lotrimin [Clotrimazole] Other (See Comments)    Mouth blisters   Lunesta [Eszopiclone] Other (See Comments)    REACTION: "slept for a week"   Oxcarbazepine  Other (See Comments)    Causes deep sleep and dizziness   Statins Other (See Comments)    Severe leg weakness Unable to walk   Ambien [Zolpidem Tartrate] Other (See Comments)    "Slept for a week"   Betadine [Povidone Iodine] Other (See Comments)    Breathing problems   Bevespi Aerosphere [Glycopyrrolate-Formoterol] Other (See Comments)    Pt believes this caused mouth sores and thrush    Biaxin [Clarithromycin] Other (See Comments)    All "mycins", Puts into "a" fib, Will take if has to for severe sinus infection   Breztri Aerosphere [Budeson-Glycopyrrol-Formoterol] Other (See Comments)    Thrush   Claritin-D [Loratadine-Pseudoephedrine Er] Other (See Comments)    Tremors, shaking   Effexor [Venlafaxine] Nausea And Vomiting and Other (See Comments)    cramps   Lexapro [Escitalopram Oxalate] Other (See Comments)    Hallucinations    Nexium [Esomeprazole Magnesium] Other (See Comments)    Hyperactivity    Aciphex [Rabeprazole Sodium] Rash   Avelox [Moxifloxacin Hcl In Nacl] Other (See Comments)    Stomach cramps   Bentyl [Dicyclomine] Rash   Bextra [Valdecoxib] Rash   Ceclor [Cefaclor] Rash   Covera-Hs [Verapamil Hcl] Palpitations   Dicyclomine Hcl Rash   Estrace [Estradiol] Other (See Comments)    Breast soreness (severe).   Fosamax [Alendronate Sodium] Other (See Comments)    Stomach issues   Keflex [Cephalexin] Rash and Other (See Comments)    Pt states that she is possibly allergic to this - had a reaction to Cefaclor in the past and she does not want to these class drugs. Added per patient request.   Other Other (See Comments)    Glue from ekg/heart monitor leads --rash, Any MYCINS    Physical Exam General: Mildly obese elderly Caucasian lady, seated, in no evident distress.  She is on home oxygen. Head: head normocephalic and atraumatic.   Neck: supple with soft right carotid bruit.   Cardiovascular: regular rate and rhythm, no murmurs Musculoskeletal: no  deformity Skin:  no rash/petichiae Vascular:  Normal pulses all extremities  Neurologic Exam Mental Status: Awake and fully alert. Oriented to place and time. Recent and remote memory intact. Attention span, concentration and fund of knowledge appropriate. Mood and affect appropriate.    recall 2/3.  Able to name 14 animals which can walk on 4 legs.  Clock drawing 3/4.  Calculation and naming intact Cranial Nerves: Fundoscopic exam difficult to do through undilated right pupil is 3 mm sluggishly reactive left pupil is 2 mm briskly reactive.. Extraocular movements full without nystagmus but slight exotropia of right eye.. Visual fields full to confrontation in left eye.  Right eye had no light perception and she is legally blind.  In the right  eye.  Hearing intact. Facial sensation intact. Face, tongue, palate moves normally and symmetrically.  Motor: Normal bulk and tone. Normal strength in all tested extremity muscles. Sensory.:  Slight diminished touch pinprick sensation left hemibody.  Position vibration intact. Coordination: Rapid alternating movements normal in all extremities. Finger-to-nose and heel-to-shin performed accurately bilaterally. Gait and Station: Arises from chair without difficulty. Stance is normal. Gait demonstrates normal stride length and balance .  Not able to heel, toe and tandem walk .  Reflexes: 1+ and symmetric. Toes downgoing.        01/17/2024    8:03 AM 01/06/2023    3:01 PM  MMSE - Mini Mental State Exam  Orientation to time 5 5  Orientation to Place 5 5  Registration 3 3  Attention/ Calculation 3 5  Recall 2 3  Language- name 2 objects 2 2  Language- repeat 1 1  Language- follow 3 step command 3 3  Language- read & follow direction 1 1  Write a sentence 1 1  Copy design 1 1  Total score 27 30      ASSESSMENT: 78 year old lady with sudden onset of painless right eye vision loss in January 2021 secondary to central retinal artery occlusion.  Also mild  cognitive and memory difficulties due to age-appropriate mild cognitive impairment which appears stable.  Vascular risk factors of atrial fibrillation, hypertension, hyperlipidemia, peripheral vascular disease and cerebrovascular disease     PLAN: I had a long discussion the patient regarding her new complaints of left temporal and frontal headaches as well as memory and cognitive difficulties due to mild cognitive impairment and answered questions.    I advised her to continue warfarin for stroke prevention given history of atrial fibrillation and maintain aggressive risk factor modification with strict control of hypertension with blood pressure goal below 130/90, lipids with LDL cholesterol goal below 70 mg percent and diabetes with hemoglobin A1c goal below 6.5%.  She is also encouraged to eat a healthy diet and to be active and lose weight.  I also encouraged her to increase participation in cognitively challenging activities like solving crossword puzzles, playing bridge and sodoku.  .We also discussed memory compensation strategies.  I recommend we check ESR, temporal artery ultrasound and MRI scan of the brain.  She will return for follow-up in the future in 6 months or call earlier if necessary  Greater than 50% time during this 35-minute  visit were spent on counseling and coordination of care about retinal artery occlusion and mild cognitive impairment and answering questions. Delia Heady, MD  [facility] [address] [place], [place] [address]  Phone [phone] Fax [phone] Note: This document was prepared with digital dictation and possible smart phrase technology. Any transcriptional errors that result from this process are unintentional.

## 2024-01-17 NOTE — Patient Instructions (Addendum)
 I had a long discussion the patient regarding her new complaints of left temporal and frontal headaches as well as memory and cognitive difficulties due to mild cognitive impairment and answered questions.    I advised her to continue warfarin for stroke prevention given history of atrial fibrillation and maintain aggressive risk factor modification with strict control of hypertension with blood pressure goal below 130/90, lipids with LDL cholesterol goal below 70 mg percent and diabetes with hemoglobin A1c goal below 6.5%.  She is also encouraged to eat a healthy diet and to be active and lose weight.  I also encouraged her to increase participation in cognitively challenging activities like solving crossword puzzles, playing bridge and sodoku.  .We also discussed memory compensation strategies.  I recommend we check ESR, temporal artery ultrasound and MRI scan of the brain.  She will return for follow-up in the future in 6 months or call earlier if necessary  Memory Compensation Strategies  Use "WARM" strategy.  W= write it down  A= associate it  R= repeat it  M= make a mental note  2.   You can keep a Glass blower/designer.  Use a 3-ring notebook with sections for the following: calendar, important names and phone numbers,  medications, doctors' names/phone numbers, lists/reminders, and a section to journal what you did  each day.   3.    Use a calendar to write appointments down.  4.    Write yourself a schedule for the day.  This can be placed on the calendar or in a separate section of the Memory Notebook.  Keeping a  regular schedule can help memory.  5.    Use medication organizer with sections for each day or morning/evening pills.  You may need help loading it  6.    Keep a basket, or pegboard by the door.  Place items that you need to take out with you in the basket or on the pegboard.  You may also want to  include a message board for reminders.  7.    Use sticky notes.  Place sticky  notes with reminders in a place where the task is performed.  For example: " turn off the  stove" placed by the stove, "lock the door" placed on the door at eye level, " take your medications" on  the bathroom mirror or by the place where you normally take your medications.  8.    Use alarms/timers.  Use while cooking to remind yourself to check on food or as a reminder to take your medicine, or as a  reminder to make a call, or as a reminder to perform another task, etc.

## 2024-01-18 LAB — SEDIMENTATION RATE: Sed Rate: 10 mm/h (ref 0–40)

## 2024-01-18 LAB — LIPID PANEL
Chol/HDL Ratio: 2.8 ratio (ref 0.0–4.4)
Cholesterol, Total: 124 mg/dL (ref 100–199)
HDL: 44 mg/dL (ref >39)
LDL Chol Calc (NIH): 54 mg/dL (ref 0–99)
Triglycerides: 150 mg/dL — ABNORMAL HIGH (ref 0–149)
VLDL Cholesterol Cal: 26 mg/dL (ref 5–40)

## 2024-01-18 LAB — HEMOGLOBIN A1C
Est. average glucose Bld gHb Est-mCnc: 134 mg/dL
Hgb A1c MFr Bld: 6.3 % — ABNORMAL HIGH (ref 4.8–5.6)

## 2024-01-19 ENCOUNTER — Ambulatory Visit: Attending: Nurse Practitioner | Admitting: Nurse Practitioner

## 2024-01-19 ENCOUNTER — Ambulatory Visit (INDEPENDENT_AMBULATORY_CARE_PROVIDER_SITE_OTHER)

## 2024-01-19 ENCOUNTER — Encounter: Payer: Self-pay | Admitting: Nurse Practitioner

## 2024-01-19 VITALS — BP 100/68 | HR 147 | Ht 63.0 in | Wt 187.0 lb

## 2024-01-19 DIAGNOSIS — I480 Paroxysmal atrial fibrillation: Secondary | ICD-10-CM

## 2024-01-19 DIAGNOSIS — I2510 Atherosclerotic heart disease of native coronary artery without angina pectoris: Secondary | ICD-10-CM

## 2024-01-19 DIAGNOSIS — J449 Chronic obstructive pulmonary disease, unspecified: Secondary | ICD-10-CM

## 2024-01-19 DIAGNOSIS — I10 Essential (primary) hypertension: Secondary | ICD-10-CM

## 2024-01-19 DIAGNOSIS — I4892 Unspecified atrial flutter: Secondary | ICD-10-CM | POA: Diagnosis not present

## 2024-01-19 DIAGNOSIS — I739 Peripheral vascular disease, unspecified: Secondary | ICD-10-CM

## 2024-01-19 DIAGNOSIS — Z7901 Long term (current) use of anticoagulants: Secondary | ICD-10-CM | POA: Diagnosis not present

## 2024-01-19 DIAGNOSIS — I5022 Chronic systolic (congestive) heart failure: Secondary | ICD-10-CM | POA: Diagnosis not present

## 2024-01-19 DIAGNOSIS — I255 Ischemic cardiomyopathy: Secondary | ICD-10-CM

## 2024-01-19 DIAGNOSIS — Q2112 Patent foramen ovale: Secondary | ICD-10-CM

## 2024-01-19 DIAGNOSIS — I712 Thoracic aortic aneurysm, without rupture, unspecified: Secondary | ICD-10-CM

## 2024-01-19 DIAGNOSIS — E785 Hyperlipidemia, unspecified: Secondary | ICD-10-CM

## 2024-01-19 DIAGNOSIS — Z8673 Personal history of transient ischemic attack (TIA), and cerebral infarction without residual deficits: Secondary | ICD-10-CM

## 2024-01-19 LAB — POCT INR: INR: 2.2 (ref 2.0–3.0)

## 2024-01-19 MED ORDER — METOPROLOL TARTRATE 50 MG PO TABS
50.0000 mg | ORAL_TABLET | Freq: Two times a day (BID) | ORAL | 3 refills | Status: DC
Start: 2024-01-19 — End: 2024-02-08

## 2024-01-19 NOTE — Patient Instructions (Addendum)
 Description   Continue taking 1/2 tablet daily except 1 tablet on Mondays and Fridays.  Recheck INR in 4 weeks.  Coumadin Clinic [phone]

## 2024-01-19 NOTE — Patient Instructions (Addendum)
 Medication Instructions:  Stop Carvedilol as directed Start Metoprolol Tartrate 50 mg twice daily  *If you need a refill on your cardiac medications before your next appointment, please call your pharmacy*   Lab Work: NONE ordered at this time of appointment   Testing/Procedures: NONE ordered at this time of appointment   Follow-Up: At [facility], you and your health needs are our priority.  As part of our continuing mission to provide you with exceptional heart care, we have created designated Provider Care Teams.  These Care Teams include your primary Cardiologist (physician) and Advanced Practice Providers (APPs -  Physician Assistants and Nurse Practitioners) who all work together to provide you with the care you need, when you need it.  We recommend signing up for the patient portal called "MyChart".  Sign up information is provided on this After Visit Summary.  MyChart is used to connect with patients for Virtual Visits (Telemedicine).  Patients are able to view lab/test results, encounter notes, upcoming appointments, etc.  Non-urgent messages can be sent to your provider as well.   To learn more about what you can do with MyChart, go to [email or web address].    Your next appointment:    Keep f/u with   Provider:   Peter Swaziland, MD     Other Instructions Monitor blood pressure. Report BP consistently greater than 140/80 or systolic BP (top number) consistently greater than 100.     1st Floor: - Lobby - Registration  - Pharmacy  - Lab - Cafe  2nd Floor: - PV Lab - Diagnostic Testing (echo, CT, nuclear med)  3rd Floor: - Vacant  4th Floor: - TCTS (cardiothoracic surgery) - AFib Clinic - Structural Heart Clinic - Vascular Surgery  - Vascular Ultrasound  5th Floor: - HeartCare Cardiology (general and EP) - Clinical Pharmacy for coumadin, hypertension, lipid, weight-loss medications, and med management appointments    Valet parking services  will be available as well.

## 2024-01-19 NOTE — Progress Notes (Signed)
 Office Visit    Patient Name: ANUREET BRUINGTON Date of Encounter: 01/19/2024  Primary Care Provider:  Georgianne Fick, MD Primary Cardiologist:  Peter Swaziland, MD  Chief Complaint    78 year old female with a history of CAD, CTO-RCA managed medically,, chronic HFrEF prior EF as low as 20 to 25% with subsequent normalization of EF, paroxysmal atrial fibrillation on chronic Coumadin, PSVT, PFO with no plans for closure, PAD s/p atherectomy, left iliac artery stenting 2019, bilateral carotid artery stenosis, severe right subclavian artery stenosis, right retinal artery occlusion, thoracic aortic aneurysm, hypertension, hyperlipidemia, CVA, COPD on home O2, fibromyalgia, and GERD who presents for hospital follow-up related to heart failure and atrial fibrillation.  Past Medical History    Past Medical History:  Diagnosis Date   Acute diastolic heart failure, NYHA class 1 (HCC) 02/08/2017   Acute heart failure (HCC) 02/08/2017   Cataract    OD   CHF (congestive heart failure) (HCC) 03/02/2017   EF 25-30% 2018   Complication of anesthesia    various issues with oxygen  saturations post op   COPD (chronic obstructive pulmonary disease) (HCC)    Depression    Depression    [employer]reesia 11/04/2020   Diverticulitis    DVT (deep venous thrombosis) (HCC)    Fibromyalgia    GERD (gastroesophageal reflux disease)    Heart murmur    [employer]reesia 11/04/2020   Hiatal hernia    History of deviated nasal septum    left- side   HTN (hypertension)    Hyperlipidemia    Hypertension    [employer]reesia 11/04/2020   Hypertensive retinopathy    OU   Myocardial infarction (HCC)    [employer]reesia 11/04/2020   Obesity    On supplemental oxygen therapy    concentrator at night @ 1.5 l/m or when sleeps. O2 Sat niormally 87.   Osteoporosis    Osteoporosis    Phreesia 11/04/2020   Oxygen deficiency    [employer]reesia 11/04/2020   PAT (paroxysmal atrial tachycardia) (HCC)    PFO (patent foramen ovale)    PULMONARY  NODULE, LEFT LOWER LOBE 10/14/2009   5mm LLL nodule dec 2010. Stable and 4mm in Oct 2012. 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 Persists on cxr 07/22/11. Cleared on CT 08/24/11. No further followup   Stroke [facility])    TOBACCO ABUSE 06/04/2009   Past Surgical History:  Procedure Laterality Date   ABDOMINAL HYSTERECTOMY N/A    [employer]reesia 11/04/2020   CARDIOVASCULAR STRESS TEST  12/26/2004   EF 74%. NO EVIDENCE OF ISCHEMIA   CATARACT EXTRACTION Left    Dr. Hortense Ramal   ESOPHAGOGASTRODUODENOSCOPY (EGD) WITH PROPOFOL N/A 04/15/2015   Procedure: ESOPHAGOGASTRODUODENOSCOPY (EGD) WITH PROPOFOL;  Surgeon: Carman Ching, MD;  Location: [facility];  Service: Endoscopy;  Laterality: N/A;   EYE SURGERY Left    Cat Sx   JOINT REPLACEMENT N/A    [employer]reesia 11/04/2020   KNEE ARTHROSCOPY  2000   left   LAPAROSCOPIC CHOLECYSTECTOMY  04-16-2010   [facility]   LOWER EXTREMITY ANGIOGRAPHY N/A 09/09/2017   Procedure: Lower Extremity Angiography;  Surgeon: Runell Gess, MD;  Location: [facility] INVASIVE CV LAB;  Service: Cardiovascular;  Laterality: N/A;   LOWER EXTREMITY INTERVENTION Left 01/17/2018   Procedure: LOWER EXTREMITY INTERVENTION;  Surgeon: Runell Gess, MD;  Location: [facility] INVASIVE CV LAB;  Service: Cardiovascular;  Laterality: Left;  MOUTH SURGERY     03-26-15 multiple extractions stitches remains   PERIPHERAL VASCULAR INTERVENTION Left 01/17/2018   Procedure: PERIPHERAL VASCULAR INTERVENTION;  Surgeon: Runell Gess, MD;  Location: [facility] INVASIVE CV LAB;  Service: Cardiovascular;  Laterality: Left;  COMMON ILIAC   RIGHT/LEFT HEART CATH AND CORONARY ANGIOGRAPHY N/A 03/04/2017   Procedure: Right/Left Heart Cath and Coronary Angiography;  Surgeon: Peter M Swaziland, MD;  Location: [facility] INVASIVE CV LAB;  Service: Cardiovascular;  Laterality: N/A;   TOTAL ABDOMINAL HYSTERECTOMY     post op needed  oxygen was told "she gave them a scare"   TUBAL LIGATION     US ECHOCARDIOGRAPHY  11/20/2009   EF 55-60%    Allergies  Allergies  Allergen Reactions   Bee Venom Anaphylaxis   Iodine Anaphylaxis and Swelling    Throat swelling   Sudafed [Pseudoephedrine] Shortness Of Breath and Palpitations   Tessalon [Benzonatate] Rash   Xanax [Alprazolam] Anaphylaxis and Other (See Comments)    Respiratory arrest   Budesonide-Formoterol Fumarate Other (See Comments)    Blisters inside of mouth all over   Crestor [Rosuvastatin Calcium] Other (See Comments)    Severe leg weakness Unable to walk   Flonase [Fluticasone Propionate] Other (See Comments)    Epistaxis    Lamictal [Lamotrigine] Diarrhea, Rash and Other (See Comments)    Difficulty breathing   Lotrimin [Clotrimazole] Other (See Comments)    Mouth blisters   Lunesta [Eszopiclone] Other (See Comments)    REACTION: "slept for a week"   Oxcarbazepine Other (See Comments)    Causes deep sleep and dizziness   Statins Other (See Comments)    Severe leg weakness Unable to walk   Ambien [Zolpidem Tartrate] Other (See Comments)    "Slept for a week"   Betadine [Povidone Iodine] Other (See Comments)    Breathing problems   Bevespi Aerosphere [Glycopyrrolate-Formoterol] Other (See Comments)    Pt believes this caused mouth sores and thrush    Biaxin [Clarithromycin] Other (See Comments)    All "mycins", Puts into "a" fib, Will take if has to for severe sinus infection   Breztri Aerosphere [Budeson-Glycopyrrol-Formoterol] Other (See Comments)    Thrush   Claritin-D [Loratadine-Pseudoephedrine Er] Other (See Comments)    Tremors, shaking   Effexor [Venlafaxine] Nausea And Vomiting and Other (See Comments)    cramps   Lexapro [Escitalopram Oxalate] Other (See Comments)    Hallucinations    Nexium [Esomeprazole Magnesium] Other (See Comments)    Hyperactivity    Aciphex [Rabeprazole Sodium] Rash   Avelox [Moxifloxacin Hcl In Nacl] Other  (See Comments)    Stomach cramps   Bentyl [Dicyclomine] Rash   Bextra [Valdecoxib] Rash   Ceclor [Cefaclor] Rash   Covera-Hs [Verapamil Hcl] Palpitations   Dicyclomine Hcl Rash   Estrace [Estradiol] Other (See Comments)    Breast soreness (severe).   Fosamax [Alendronate Sodium] Other (See Comments)    Stomach issues   Keflex [Cephalexin] Rash and Other (See Comments)    Pt states that she is possibly allergic to this - had a reaction to Cefaclor in the past and she does not want to these class drugs. Added per patient request.   Other Other (See Comments)    Glue from ekg/heart monitor leads --rash, Any MYCINS     Labs/Other Studies Reviewed    The following studies were reviewed today:  Cardiac Studies & Procedures   ______________________________________________________________________________________________ CARDIAC CATHETERIZATION  CARDIAC CATHETERIZATION 03/04/2017  Narrative  Ost 1st Mrg to 1st  Mrg lesion, 75 %stenosed.  2nd Mrg lesion, 90 %stenosed.  Ost RCA to Mid RCA lesion, 100 %stenosed.  There is severe left ventricular systolic dysfunction.  LV end diastolic pressure is normal.  The left ventricular ejection fraction is 25-35% by visual estimate.  Hemodynamic findings consistent with mild pulmonary hypertension.  LV end diastolic pressure is normal.  1. Severe 2 vessel obstructive CAD - 75% proximal OM1 - 90% small OM2 - 100% proximal RCA. Left to right collaterals. 2. Severe LV dysfunction 3. Mild pulmonary HTN with normal LV filling pressures. 4. Cardiac index 2.41 L/min/BSA   Plan: Medical management to try and optimize CHF therapy. Patient appears to be adequately diuresed at this time. Based on these results her cardiomyopathy is ischemic. I would treat her CAD medically. If cardiac cath is needed in the future would consider alternative access given difficulty from the right radial approach. Her rhythm during procedure is a multifocal atrial  rhythm/tachycardia.  Findings Coronary Findings Diagnostic  Dominance: Right  Left Main Vessel was injected. Vessel is normal in caliber. Vessel is angiographically normal.  Left Anterior Descending Vessel was injected. Vessel is large. The vessel exhibits minimal luminal irregularities.  Left Circumflex  First Obtuse Marginal Branch  Second Obtuse Marginal Branch Vessel is small in size.  Right Coronary Artery Vessel was not injected. Vessel is large and very large. The vessel is severely calcified. Collaterals Dist RCA filled by collaterals from 2nd Sept.  The lesion is severely calcified.  Right Posterior Descending Artery Collaterals RPDA filled by collaterals from 3rd Sept.  Intervention  No interventions have been documented.     ECHOCARDIOGRAM  ECHOCARDIOGRAM COMPLETE 01/02/2024  Narrative ECHOCARDIOGRAM REPORT    Patient Name:   TERRE HANNEMAN Date of Exam: 01/02/2024 Medical Rec #:  [id number]         Height:       64.0 in Accession #:    [id number]        Weight:       188.1 lb Date of Birth:  Sep 15, 1946          BSA:          1.906 m Patient Age:    78 years          BP:           110/72 mmHg Patient Gender: F                 HR:           107 bpm. Exam Location:  Inpatient  Procedure: 2D Echo, Cardiac Doppler and Color Doppler (Both Spectral and Color Flow Doppler were utilized during procedure).  Indications:    Afib  History:        Patient has prior history of Echocardiogram examinations, most recent 12/06/2021. CAD.  Sonographer:    Harriette Bouillon RDCS Referring Phys: [phone] KENNETH C HILTY  IMPRESSIONS   1. Left ventricular ejection fraction, by estimation, is 65 to 70%. The left ventricle has normal function. The left ventricle has no regional wall motion abnormalities. There is mild left ventricular hypertrophy. Left ventricular diastolic function could not be evaluated. 2. Right ventricular systolic function is normal. The right  ventricular size is normal. 3. Left atrial size was mildly dilated. 4. The mitral valve is abnormal. Trivial mitral valve regurgitation. 5. The aortic valve is tricuspid. Aortic valve regurgitation is not visualized. Aortic valve sclerosis/calcification is present, without any evidence of aortic stenosis. 6. Aortic dilatation noted. There  is mild dilatation of the ascending aorta, measuring 41 mm. 7. The inferior vena cava is normal in size with greater than 50% respiratory variability, suggesting right atrial pressure of 3 mmHg.  Comparison(s): Changes from prior study are noted. 12/06/2021: LVEF 55-60%, mild LAE.  FINDINGS Left Ventricle: Left ventricular ejection fraction, by estimation, is 65 to 70%. The left ventricle has normal function. The left ventricle has no regional wall motion abnormalities. Strain imaging was not performed. The left ventricular internal cavity size was normal in size. There is mild left ventricular hypertrophy. Left ventricular diastolic function could not be evaluated due to atrial fibrillation. Left ventricular diastolic function could not be evaluated.  Right Ventricle: The right ventricular size is normal. No increase in right ventricular wall thickness. Right ventricular systolic function is normal.  Left Atrium: Left atrial size was mildly dilated.  Right Atrium: Right atrial size was normal in size.  Pericardium: There is no evidence of pericardial effusion.  Mitral Valve: The mitral valve is abnormal. Mild mitral annular calcification. Trivial mitral valve regurgitation.  Tricuspid Valve: The tricuspid valve is grossly normal. Tricuspid valve regurgitation is trivial.  Aortic Valve: The aortic valve is tricuspid. Aortic valve regurgitation is not visualized. Aortic valve sclerosis/calcification is present, without any evidence of aortic stenosis.  Pulmonic Valve: The pulmonic valve was grossly normal. Pulmonic valve regurgitation is trivial.  Aorta:  Aortic dilatation noted. There is mild dilatation of the ascending aorta, measuring 41 mm.  Venous: The inferior vena cava is normal in size with greater than 50% respiratory variability, suggesting right atrial pressure of 3 mmHg.  IAS/Shunts: No atrial level shunt detected by color flow Doppler.  Additional Comments: 3D imaging was not performed.   LEFT VENTRICLE PLAX 2D LVIDd:         4.90 cm LVIDs:         2.90 cm LV PW:         1.20 cm LV IVS:        1.20 cm LVOT diam:     2.20 cm LV SV:         54 LV SV Index:   28 LVOT Area:     3.80 cm   RIGHT VENTRICLE         IVC TAPSE (M-mode): 1.5 cm  IVC diam: 1.70 cm  LEFT ATRIUM             Index        RIGHT ATRIUM           Index LA diam:        4.00 cm 2.10 cm/m   RA Area:     16.90 cm LA Vol (A2C):   61.8 ml 32.42 ml/m  RA Volume:   44.40 ml  23.29 ml/m LA Vol (A4C):   59.7 ml 31.32 ml/m LA Biplane Vol: 61.3 ml 32.16 ml/m AORTIC VALVE LVOT Vmax:   82.00 cm/s LVOT Vmean:  58.200 cm/s LVOT VTI:    0.142 m  AORTA Ao Root diam: 3.70 cm Ao Asc diam:  4.10 cm   SHUNTS Systemic VTI:  0.14 m Systemic Diam: 2.20 cm  Zoila Shutter MD Electronically signed by Zoila Shutter MD Signature Date/Time: 01/02/2024/10:34:12 AM    Final    MONITORS  LONG TERM MONITOR (3-14 DAYS) 01/02/2022  Narrative  Normal sinus rhythm  Atrial fibrillation with RVR on 12/19/21 and 12/21/21 with longest episode lasting 5 hours and 40 minutes and average HR 120 bpm. AFib burden 2%  Frequent PACs with few short bursts of SVT longest lasting 15 beats  Occasional PVCs with rare NSVT longest lasting 5 beats.  Symptoms appear to correlate with Afib an SVT.   Patch Wear Time:  13 days and 23 hours (2023-02-10T10:12:14-0500 to 2023-02-24T09:21:34-0500)  Patient had a min HR of 43 bpm, max HR of 235 bpm, and avg HR of 71 bpm. Predominant underlying rhythm was Sinus Rhythm. First Degree AV Block was present. 2 Ventricular Tachycardia runs  occurred, the run with the fastest interval lasting 5 beats with a max rate of 235 bpm (avg 156 bpm); the run with the fastest interval was also the longest. 7 Supraventricular Tachycardia runs occurred, the run with the fastest interval lasting 4 beats with a max rate of 176 bpm, the longest lasting 15 beats with an avg rate of 128 bpm. Some episodes of Supraventricular Tachycardia may be possible Atrial Tachycardia with variable block. Atrial Fibrillation occurred (2% burden), ranging from 75-167 bpm (avg of 120 bpm), the longest lasting 5 hours 40 mins with an avg rate of 116 bpm. Supraventricular Tachycardia and Atrial Fibrillation were detected within +/- 45 seconds of symptomatic patient event(s). Isolated SVEs were frequent (11.5%, [id number]), SVE Couplets were frequent (5.5%, [id number]), and SVE Triplets were occasional (1.7%, 8129). Isolated VEs were occasional (4.5%, [id number]), VE Couplets were rare (<1.0%, 596), and VE Triplets were rare (<1.0%, 41). Ventricular Bigeminy and Trigeminy were present.       ______________________________________________________________________________________________     Recent Labs: 01/01/2024: B Natriuretic Peptide 219.3; Magnesium 1.8; TSH 1.394 01/02/2024: Hemoglobin 12.6; Platelets 163 01/03/2024: BUN 14; Creatinine, Ser 0.96; Potassium 3.8; Sodium 137  Recent Lipid Panel    Component Value Date/Time   CHOL 124 01/17/2024 0839   TRIG 150 (H) 01/17/2024 0839   HDL 44 01/17/2024 0839   CHOLHDL 2.8 01/17/2024 0839   CHOLHDL 3 07/02/2021 1017   VLDL 44.0 (H) 07/02/2021 1017   LDLCALC 54 01/17/2024 0839   LDLDIRECT 67.0 07/02/2021 1017    History of Present Illness    78 year old female with the above past medical history including CAD, CTO-RCA managed medically, chronic HFrEF prior EF as low as 20 to 25% with subsequent normalization of EF, paroxysmal atrial fibrillation chronic Coumadin, PSVT, PFO with no plans for closure, PAD s/p atherectomy, left iliac  artery stenting 2019, bilateral carotid artery stenosis, severe right subclavian artery stenosis, right retinal artery occlusion, thoracic aortic aneurysm, hypertension, hyperlipidemia, CVA, COPD on home O2, fibromyalgia, and GERD.  She was hospitalized in April 2018 in the setting of atrial fibrillation with RVR.  Cardiac catheterization in 2018 revealed severe two-vessel obstructive disease of the OM1 and OM 2 branches, 100% occlusion of the RCA with left-to-right collaterals.  Medical management was advised.  EF at the time was 25 to 30%, this normalized with GDMT by 2023.  She has a history of significant PAD as above.  Also with aortic and right common femoral artery stenoses, not felt to be amenable to PCI.  She had sudden loss of vision in January 2021 in the setting of central retinal artery occlusion.  INR was therapeutic at the time.  She was started on Plavix.  MRI showed no acute infarct, however, there was evidence of multiple prior strokes.  She was hospitalized in February 2023 in the setting of A-fib with RVR.  Echocardiogram at time showed EF 55 to 60%, she converted spontaneously to normal sinus rhythm.  He was last seen in the office on 07/14/2019 form was  stable overall from a cardiac standpoint.  She denies chest pain.  She presented to the ED on 01/01/2024 in the setting of atrial fibrillation with RVR.  Cardiology was consulted.  She was transitioned to oral diltiazem.  She spontaneously converted to normal sinus rhythm.  Echocardiogram showed EF 65 to 70%, no RWMA, mild LVH, normal RV, mild LAE, trivial MR, aortic sclerosis without evidence of stenosis, mild dilation of the ascending aorta measuring 41 mm.  She was discharged home in stable vision on 01/03/2024.  She presents today for follow-up.  Since her recent hospitalization she has been stable overall from a cardiac standpoint.  She has noted some intermittent palpitations, intermittent dizziness.  BP and heart rate have been stable  overall though her heart rate was mildly elevated prior to today's office visit at 102 bpm.  She has stable chronic dyspnea, she denies any chest pain, presyncope, syncope, edema, PND, orthopnea, weight gain.  Home Medications    Current Outpatient Medications  Medication Sig Dispense Refill   acetaminophen (TYLENOL) 500 MG tablet Take 1,000 mg by mouth 2 (two) times daily as needed for moderate pain (pain score 4-6) or headache.     carvedilol (COREG) 3.125 MG tablet Take 1 tablet (3.125 mg total) by mouth 2 (two) times daily. 60 tablet 0   clopidogrel (PLAVIX) 75 MG tablet TAKE 1 TABLET BY MOUTH EVERY DAY 30 tablet 11   desvenlafaxine (PRISTIQ) 25 MG 24 hr tablet Take 1 tablet (25 mg total) by mouth daily. (Patient taking differently: Take 25 mg by mouth daily at 4 PM.) 30 tablet 2   diltiazem (CARDIZEM CD) 180 MG 24 hr capsule Take 1 capsule (180 mg total) by mouth daily. 30 capsule 2   diltiazem (CARDIZEM) 30 MG tablet Take 1 tablet (30 mg total) by mouth every 8 (eight) hours as needed (Heart rate greater than 130). (Patient taking differently: Take 30 mg by mouth as needed (Heart rate greater than 130).) 90 tablet 2   Evolocumab (REPATHA SURECLICK) 140 MG/ML SOAJ Inject 140 mg into the skin every 14 (fourteen) days. 6 mL 1   furosemide (LASIX) 40 MG tablet TAKE 1 TABLET BY MOUTH EVERY DAY 90 tablet 2   gabapentin (NEURONTIN) 100 MG capsule Take 200 mg by mouth as needed (neuropathy).     HYDROcodone-acetaminophen (NORCO/VICODIN) 5-325 MG tablet Take 1 tablet by mouth 2 (two) times daily.     montelukast (SINGULAIR) 10 MG tablet TAKE 1 TABLET BY MOUTH EVERYDAY AT BEDTIME (Patient taking differently: Take 10 mg by mouth daily.) 90 tablet 2   nitroGLYCERIN (NITROSTAT) 0.4 MG SL tablet PLACE 1 TABLET UNDER THE TONGUE EVERY 5 MINUTES AS NEEDED FOR CHEST PAIN. 25 tablet 7   OXYGEN Inhale 2 L/min into the lungs continuous.     pantoprazole (PROTONIX) 40 MG tablet TAKE 1 TABLET BY MOUTH EVERY DAY 30  tablet 5   spironolactone (ALDACTONE) 25 MG tablet Take 0.5 tablets (12.5 mg total) by mouth daily. 15 tablet 0   traZODone (DESYREL) 100 MG tablet Take one tab daily as needed for sleep (Patient taking differently: Take 100 mg by mouth at bedtime.) 30 tablet 2   warfarin (COUMADIN) 5 MG tablet TAKE ONE-HALF TO 1 TABLET BY MOUTH DAILY OR AS DIRECTED BY CLINIC (Patient taking differently: Take 2.5-5 mg by mouth See admin instructions. Take 1/2 tablet (2.5mg ) by mouth at 1600 on all days EXCEPT take 1 tablet (5mg ) on Monday and Friday only.) 30 tablet 3   No  current facility-administered medications for this visit.     Review of Systems    She denies chest pain, palpitations, pnd, orthopnea, n, v, syncope, edema, weight gain, or early satiety. All other systems reviewed and are otherwise negative except as noted above.   Physical Exam    VS:  BP 100/68   Pulse (!) 147   Ht 5\' 3"  (1.6 m)   Wt 187 lb (84.8 kg)   SpO2 92%   BMI 33.13 kg/m   GEN: Well nourished, well developed, in no acute distress. HEENT: normal. Neck: Supple, no JVD, carotid bruits, or masses. Cardiac: RRR, no murmurs, rubs, or gallops. No clubbing, cyanosis, edema.  Radials/DP/PT 2+ and equal bilaterally.  Respiratory:  Respirations regular and unlabored, clear to auscultation bilaterally. GI: Soft, nontender, nondistended, BS + x 4. MS: no deformity or atrophy. Skin: warm and dry, no rash. Neuro:  Strength and sensation are intact. Psych: Normal affect.  Accessory Clinical Findings    ECG personally reviewed by me today - EKG Interpretation Date/Time:  Wednesday January 19 2024 10:37:00 EDT Ventricular Rate:  147 PR Interval:    QRS Duration:  108 QT Interval:  270 QTC Calculation: 422 R Axis:   88  Text Interpretation: Atrial flutter When compared with ECG of 01-Jan-2024 09:14, PREVIOUS ECG IS PRESENT Confirmed by Bernadene Person ([id number]) on 01/19/2024 11:08:48 AM  - no acute changes.   Lab Results  Component  Value Date   WBC 5.0 01/02/2024   HGB 12.6 01/02/2024   HCT 37.7 01/02/2024   MCV 93.3 01/02/2024   PLT 163 01/02/2024   Lab Results  Component Value Date   CREATININE 0.96 01/03/2024   BUN 14 01/03/2024   NA 137 01/03/2024   K 3.8 01/03/2024   CL 97 (L) 01/03/2024   CO2 32 01/03/2024   Lab Results  Component Value Date   ALT 12 12/05/2021   AST 16 12/05/2021   ALKPHOS 56 12/05/2021   BILITOT 0.5 12/05/2021   Lab Results  Component Value Date   CHOL 124 01/17/2024   HDL 44 01/17/2024   LDLCALC 54 01/17/2024   LDLDIRECT 67.0 07/02/2021   TRIG 150 (H) 01/17/2024   CHOLHDL 2.8 01/17/2024    Lab Results  Component Value Date   HGBA1C 6.3 (H) 01/17/2024    Assessment & Plan    1. Paroxysmal atrial fibrillation/atrial flutter: Recent hospitalization in the setting of atrial fibrillation with RVR.   Echocardiogram showed EF 65 to 70%, no RWMA, mild LVH, normal RV, mild LAE, trivial MR, aortic sclerosis without evidence of stenosis, mild dilation of the ascending aorta measuring 41 mm. EKG today shows rapid atrial flutter, HR 147 bpm.  She notes some mild lightheadedness, she denies any other symptoms.  Euvolemic and well compensated. She reports adherence to her medications. Reviewed with Dr. Royann Shivers, DOD. Will stop carvedilol and start metoprolol 50 mg twice daily.  Will place urgent referral to A-fib clinic. Continue diltiazem 180 mg daily, diltiazem 30 mg every 8 hours as needed for heart rate greater than 130 bpm. Reviewed ED precautions.    2. CAD: Cardiac catheterization in 2018 revealed severe two-vessel obstructive disease of the OM1 and OM 2 branches, 100% occlusion of the RCA with left-to-right collaterals.  Medical management was advised.  Stable with no anginal symptoms.  Transition to metoprolol as above, continue Plavix, Repatha.  3. Chronic HFrEF with improved EF: Higher EF 25 to 30%, most recent echo showed EF 65 to 70%.  Euvolemic  and well compensated on exam.   Transition to metoprolol as above, continue spironolactone, Lasix.  4. PFO: No plans for closure.  5. PAD: S/p atherectomy, left iliac artery stenting 2019. Also with bilateral carotid artery stenosis, severe right subclavian artery stenosis, as well as  aortic and right common femoral artery stenoses, not felt to be amenable to PCI.  Consider repeat ABIs, aortoiliac ultrasound in 03/2024.  Continue Plavix, Repatha.  6. Right retinal artery occlusion/history of CVA: She had sudden loss of vision in January 2021 in the setting of central retinal artery occlusion.  INR was therapeutic at the time.  She was started on Plavix.  MRI showed no acute infarct, however, there was evidence of multiple prior strokes.  Continue warfarin, Plavix.  7. Hypertension: BP mildly low in office today, has been stable overall.  Continue to monitor BP with transition from carvedilol to metoprolol as above.  8. Hyperlipidemia: LDL was 62 in 07/2023.  Statin intolerant.  Continue Repatha.  9. Thoracic aortic aneurysm: Most recent echocardiogram in 01/2024 showed mild dilation of ascending aorta measuring 41 mm.  Continue to monitor with serial echocardiogram versus CT chest/aorta.  10. COPD: Stable chronic dyspnea, on home O2.  Follows with pulmonology.  11. Disposition:  Follow-up with A-fib clinic ASAP, follow-up with Dr. Swaziland as scheduled in 03/2024.       Joylene Grapes, NP 01/19/2024, 11:09 AM

## 2024-01-20 ENCOUNTER — Encounter: Payer: Self-pay | Admitting: *Deleted

## 2024-01-20 NOTE — Progress Notes (Signed)
 Kindly inform the patient that lab work for cholesterol was satisfactory.  Screening test for diabetes suggest borderline risk.  ESR which is a marker of inflammation was normal

## 2024-01-21 ENCOUNTER — Telehealth: Payer: Self-pay | Admitting: Neurology

## 2024-01-21 NOTE — Telephone Encounter (Signed)
 Diltiazem and metoprolol together can reduce heart rate, but that is why the diltiazem was prescribed for her.. She is taking the diltiazem 30mg  for heart rates >130. If heart rate increases greater than 130 recommend she take a diltiazem 30mg  and continue to take the metoprolol as scheduled. Diltiazem 30mg  lasts about 3-4 hours. Recommend she monitor pulse rate and blood pressure while on medications. Me to [name], [facility]     01/20/24  3:14 PM The "new med" would be Metoprolol that was prescribed yesterday [name], [facility] to Me     01/20/24  2:39 PM Patient can take hydrocodone and diltiazem together. May possibly extend the effects (and side effects) of hydrocodone. Patient should monitor for serious side effects such as respiratory depression and hypotension.  Concurrent use of HYDROCODONE and Diltiazem may result in increased HYDROcodone exposure and risk of increased or prolonged opioid effects Patient identification verified by 2 forms. _________________________________________________________ [name], RN    Called and spoke to patient  Patient states:   -has not taken hydrocodone due to concern of interaction   -was concerned about decreasing heart rate with diltiazem and metoprolol  -last night has a episode of heart rate in the 150s, did not take diltiazem 30mg    -takes Metoprolol at 3:20am and at 7pm   -Takes diltiazem 180mg  at 12pm  RN relayed pharmacy instructions  Encouraged patient to take diltiazem 30mg  when having episodes of HR>130BPM  Advised patient to monitor BP and heart rate  Patient verbalized understanding, no questions at this time

## 2024-01-21 NOTE — Telephone Encounter (Signed)
 [employer] medic[employer] NPR sent to GI [phone]

## 2024-01-24 ENCOUNTER — Other Ambulatory Visit: Payer: Self-pay

## 2024-01-24 ENCOUNTER — Ambulatory Visit (HOSPITAL_COMMUNITY)
Admission: RE | Admit: 2024-01-24 | Discharge: 2024-01-24 | Disposition: A | Discharge status: Home / Self Care | Source: Ambulatory Visit | Attending: Internal Medicine | Admitting: Internal Medicine

## 2024-01-24 VITALS — BP 130/76 | HR 80 | Ht 63.0 in | Wt 186.0 lb

## 2024-01-24 DIAGNOSIS — Z8673 Personal history of transient ischemic attack (TIA), and cerebral infarction without residual deficits: Secondary | ICD-10-CM | POA: Diagnosis not present

## 2024-01-24 DIAGNOSIS — D6869 Other thrombophilia: Secondary | ICD-10-CM | POA: Diagnosis not present

## 2024-01-24 DIAGNOSIS — I5033 Acute on chronic diastolic (congestive) heart failure: Secondary | ICD-10-CM | POA: Diagnosis not present

## 2024-01-24 DIAGNOSIS — J449 Chronic obstructive pulmonary disease, unspecified: Secondary | ICD-10-CM | POA: Insufficient documentation

## 2024-01-24 DIAGNOSIS — Z7901 Long term (current) use of anticoagulants: Secondary | ICD-10-CM | POA: Diagnosis not present

## 2024-01-24 DIAGNOSIS — I739 Peripheral vascular disease, unspecified: Secondary | ICD-10-CM | POA: Diagnosis not present

## 2024-01-24 DIAGNOSIS — I480 Paroxysmal atrial fibrillation: Secondary | ICD-10-CM

## 2024-01-24 DIAGNOSIS — Z79899 Other long term (current) drug therapy: Secondary | ICD-10-CM | POA: Diagnosis not present

## 2024-01-24 DIAGNOSIS — I110 Hypertensive heart disease with heart failure: Secondary | ICD-10-CM | POA: Insufficient documentation

## 2024-01-24 DIAGNOSIS — Z9981 Dependence on supplemental oxygen: Secondary | ICD-10-CM | POA: Diagnosis not present

## 2024-01-24 DIAGNOSIS — Z87891 Personal history of nicotine dependence: Secondary | ICD-10-CM | POA: Insufficient documentation

## 2024-01-24 MED ORDER — DILTIAZEM HCL ER COATED BEADS 240 MG PO CP24: 240.0000 mg | ORAL_CAPSULE | Freq: Every day | ORAL | 3 refills | Status: DC

## 2024-01-24 NOTE — Patient Instructions (Signed)
Increase cardizem to 240mg  once a day

## 2024-01-24 NOTE — Progress Notes (Addendum)
 Primary Care Physician: Georgianne Fick, MD Primary Cardiologist: Peter Swaziland, MD Electrophysiologist: None     Referring Physician: Dr. Swaziland     Laurie Horton is a 78 y.o. female with a history of HTN, aortic and coronary artery atherosclerosis by CT imaging, history of tobacco use, chronic systolic HF with recovered LVEF, CVA, PAD, s/p right retinal artery occlusion, HLD, oxygen dependent COPD, fibromyalgia, PFO, and paroxysmal atrial fibrillation/flutter who presents for consultation in the [facility]. Recent hospital admission 3/1-01/2024 for PAF with RVR; she converted to NSR spontaneously while admitted. Seen by Cardiology on 3/19 noted to be in atrial flutter with RVR. Coreg transitioned to metoprolol 50 mg BID. Patient is on coumadin for a CHADS2VASC score of 8.  On evaluation today, she is currently in NSR. Patient notes she has been in Afib about 5 times since hospital discharge. She notes to be nauseous on metoprolol but does admit that the combination of diltiazem and metoprolol seem to help her somewhat with her HR. She feels dizzy when out of rhythm and will have to lie down to make sure she doesn't fall. She is on coumadin.  Today, she denies symptoms of palpitations, chest pain, shortness of breath, orthopnea, PND, lower extremity edema, dizziness, presyncope, syncope, snoring, daytime somnolence, bleeding, or neurologic sequela. The patient is tolerating medications without difficulties and is otherwise without complaint today.    she has a BMI of Body mass index is 32.95 kg/m.Marland Kitchen Filed Weights   01/24/24 0817  Weight: 84.4 kg    Current Outpatient Medications  Medication Sig Dispense Refill   acetaminophen (TYLENOL) 500 MG tablet Take 1,000 mg by mouth 2 (two) times daily as needed for moderate pain (pain score 4-6) or headache.     clopidogrel (PLAVIX) 75 MG tablet TAKE 1 TABLET BY MOUTH EVERY DAY 30 tablet 11   desvenlafaxine  (PRISTIQ) 25 MG 24 hr tablet Take 1 tablet (25 mg total) by mouth daily. (Patient taking differently: Take 25 mg by mouth daily at 4 PM.) 30 tablet 2   diltiazem (CARDIZEM) 30 MG tablet Take 1 tablet (30 mg total) by mouth every 8 (eight) hours as needed (Heart rate greater than 130). (Patient taking differently: Take 30 mg by mouth as needed (Heart rate greater than 130).) 90 tablet 2   Evolocumab (REPATHA SURECLICK) 140 MG/ML SOAJ Inject 140 mg into the skin every 14 (fourteen) days. 6 mL 1   furosemide (LASIX) 40 MG tablet TAKE 1 TABLET BY MOUTH EVERY DAY 90 tablet 2   gabapentin (NEURONTIN) 100 MG capsule Take 200 mg by mouth as needed (neuropathy).     HYDROcodone-acetaminophen (NORCO/VICODIN) 5-325 MG tablet Take 1 tablet by mouth 2 (two) times daily.     metoprolol tartrate (LOPRESSOR) 50 MG tablet Take 1 tablet (50 mg total) by mouth 2 (two) times daily. 180 tablet 3   montelukast (SINGULAIR) 10 MG tablet TAKE 1 TABLET BY MOUTH EVERYDAY AT BEDTIME (Patient taking differently: Take 10 mg by mouth daily.) 90 tablet 2   nitroGLYCERIN (NITROSTAT) 0.4 MG SL tablet PLACE 1 TABLET UNDER THE TONGUE EVERY 5 MINUTES AS NEEDED FOR CHEST PAIN. 25 tablet 7   OXYGEN Inhale 2 L/min into the lungs continuous.     pantoprazole (PROTONIX) 40 MG tablet TAKE 1 TABLET BY MOUTH EVERY DAY 30 tablet 5   polyethylene glycol (MIRALAX / GLYCOLAX) 17 g packet Take 17 g by mouth every other day.     spironolactone (ALDACTONE)  25 MG tablet Take 0.5 tablets (12.5 mg total) by mouth daily. 15 tablet 0   traZODone (DESYREL) 100 MG tablet Take one tab daily as needed for sleep (Patient taking differently: Take 100 mg by mouth at bedtime.) 30 tablet 2   warfarin (COUMADIN) 5 MG tablet TAKE ONE-HALF TO 1 TABLET BY MOUTH DAILY OR AS DIRECTED BY CLINIC (Patient taking differently: Take 2.5-5 mg by mouth See admin instructions. Take 1/2 tablet (2.5mg ) by mouth at 1600 on all days EXCEPT take 1 tablet (5mg ) on Monday and Friday  only.) 30 tablet 3   diltiazem (CARDIZEM CD) 240 MG 24 hr capsule Take 1 capsule (240 mg total) by mouth daily. 30 capsule 3   No current facility-administered medications for this encounter.    Atrial Fibrillation Management history:  Previous antiarrhythmic drugs: none Previous cardioversions: none Previous ablations: none Anticoagulation history: coumadin   ROS- All systems are reviewed and negative except as per the HPI above.  Physical Exam: BP 130/76   Pulse 80   Ht 5\' 3"  (1.6 m)   Wt 84.4 kg   BMI 32.95 kg/m   GEN: Well nourished, well developed in no acute distress NECK: No JVD; No carotid bruits CARDIAC: Regular rate and rhythm, no murmurs, rubs, gallops RESPIRATORY:  Clear to auscultation without rales, wheezing or rhonchi  ABDOMEN: Soft, non-tender, non-distended EXTREMITIES:  No edema; No deformity   EKG today demonstrates  Vent. rate 80 BPM PR interval 194 ms QRS duration 106 ms QT/QTcB 404/465 ms P-R-T axes 93 89 57 Normal sinus rhythm Nonspecific T wave abnormality Abnormal ECG When compared with ECG of 19-Jan-2024 10:37, PREVIOUS ECG IS PRESENT  Echo 01/02/24 demonstrated  1. Left ventricular ejection fraction, by estimation, is 65 to 70%. The  left ventricle has normal function. The left ventricle has no regional  wall motion abnormalities. There is mild left ventricular hypertrophy.  Left ventricular diastolic function  could not be evaluated.   2. Right ventricular systolic function is normal. The right ventricular  size is normal.   3. Left atrial size was mildly dilated.   4. The mitral valve is abnormal. Trivial mitral valve regurgitation.   5. The aortic valve is tricuspid. Aortic valve regurgitation is not  visualized. Aortic valve sclerosis/calcification is present, without any  evidence of aortic stenosis.   6. Aortic dilatation noted. There is mild dilatation of the ascending  aorta, measuring 41 mm.   7. The inferior vena cava is  normal in size with greater than 50%  respiratory variability, suggesting right atrial pressure of 3 mmHg.   Comparison(s): Changes from prior study are noted. 12/06/2021: LVEF 55-60%,  mild LAE.   ASSESSMENT & PLAN CHA2DS2-VASc Score = 8  The patient's score is based upon: CHF History: 1 HTN History: 1 Diabetes History: 0 Stroke History: 2 Vascular Disease History: 1 Age Score: 2 Gender Score: 1       ASSESSMENT AND PLAN: Paroxysmal Atrial Fibrillation (ICD10:  I48.0) The patient's CHA2DS2-VASc score is 8, indicating a 10.8% annual risk of stroke.    She is currently in NSR. She notes to be paroxysmal and feels poorly when in Afib. We discussed rhythm control options which appear limited. She is on continuous oxygen for her COPD which makes her an unlikely candidate for ablation. It is noted she has 33 allergies listed in her medical chart. Her continuous oxygen for COPD makes amiodarone a less favorable candidate. Patient notes she is allergic to iodine so unsure if this  is specifically just contrast-dye related or if it can cross over to medications. Acute on chronic exacerbation of HFpEF prescribed lasix 40 mg daily while in hospital. Given this and multi-vessel atherosclerosis by CT imaging, unlikely candidate for either Multaq or flecainide. Her Qtc on previous ECGs have been ~450s while in NSR; today her Qtc is 465 ms. This may make Tikosyn a less favorable choice.   I am going to refer patient to EP to discuss the best possible strategy for rhythm control going forward. Unsure if would try Tikosyn despite Qtc already being at the limit versus amiodarone despite oxygen use / iodine allergy. She could be a candidate for AV nodal ablation given her PAF is very rapid. Will increase rate control for now to diltiazem 240 mg daily.  Secondary Hypercoagulable State (ICD10:  D68.69) The patient is at significant risk for stroke/thromboembolism based upon her CHA2DS2-VASc Score of 8.   Continue Warfarin (Coumadin).  She is on coumadin.     Follow up Afib clinic pending plan made with EP.    [place] Bells, PA-C  [facility] [address] [place], [place] [address] [phone]

## 2024-01-26 ENCOUNTER — Encounter: Payer: Self-pay | Admitting: Cardiology

## 2024-01-31 ENCOUNTER — Other Ambulatory Visit: Payer: Self-pay | Admitting: Cardiology

## 2024-02-04 ENCOUNTER — Ambulatory Visit (HOSPITAL_COMMUNITY)
Admission: RE | Admit: 2024-02-04 | Discharge: 2024-02-04 | Disposition: A | Discharge status: Home / Self Care | Source: Ambulatory Visit | Attending: Neurology | Admitting: Neurology

## 2024-02-04 DIAGNOSIS — R519 Headache, unspecified: Secondary | ICD-10-CM | POA: Insufficient documentation

## 2024-02-08 ENCOUNTER — Telehealth: Payer: Self-pay | Admitting: Cardiology

## 2024-02-08 MED ORDER — METOPROLOL TARTRATE 25 MG PO TABS: 25.0000 mg | ORAL_TABLET | Freq: Two times a day (BID) | ORAL | 3 refills | Status: AC

## 2024-02-08 MED ORDER — DILTIAZEM HCL ER COATED BEADS 180 MG PO CP24: 180.0000 mg | ORAL_CAPSULE | Freq: Every day | ORAL | 3 refills | Status: DC

## 2024-02-08 NOTE — Telephone Encounter (Signed)
 Swaziland, Peter M, MD  You; Charna Elizabeth, LPN20 minutes ago (11:21 AM)    We can try and reduce metoprolol to 25 mg bid and monitor HR closely  Peter Swaziland MD, [facility]   Patient identification verified by 2 forms. [name], RN    Called and spoke to patient  Relayed provider message  Reviewed Rx instruction/education  Patient aware Rx sent to preferred pharmacy  Reviewed ED warning signs/precautions  Patient verbalized understanding, no questions at this time

## 2024-02-08 NOTE — Telephone Encounter (Signed)
 Pt c/o medication issue:  1. Name of Medication: metoprolol tartrate (LOPRESSOR) 50 MG tablet   2. How are you currently taking this medication (dosage and times per day)? As written  3. Are you having a reaction (difficulty breathing--STAT)? No  4. What is your medication issue? Pt feels like the medication is making her dizzy, sick to her stomach and it might be affecting her BP. Please call pt after 10am. Please advise

## 2024-02-08 NOTE — Telephone Encounter (Signed)
 Patient identification verified by 2 forms. [name], RN    Called and spoke to patient  Patient states:   -she has been feeling dizzy for a few weeks   -she reported dizziness at previous OV   -this morning she had OV with PCP who was concerned about dizziness and heart rate   -At PCP visit this morning BP: 140/73 and Hr: 41 (8am)  -4/8 at 5:00am BP 150/73 Hr: 67   -took Metoprolol at 5am  -she has no energy at times  -Takes Diltiazem 180 mg not 120 mg  -At time of call BP: 148/111 Hr: 68 -concerned dizziness is caused by Metoprolol  -Takes Metoprolol at 3am and 2pm  -Takes Diltiazem 180 mg at 12pm  -Takes Hydrocodone at 6pm  -Takes Trazadone at 7pm  -spironolactone 12.5mg  at 6am  Patient denies:   -LOC/falls  Informed patient message sent to Dr. Swaziland regarding dizziness  Patient verbalized understanding, no questions at this time

## 2024-02-12 ENCOUNTER — Inpatient Hospital Stay: Admission: RE | Admit: 2024-02-12 | Source: Ambulatory Visit

## 2024-02-14 NOTE — Progress Notes (Signed)
 Kindly inform the patient that temporal artery ultrasound does not show evidence of temporal arteritis

## 2024-02-15 ENCOUNTER — Ambulatory Visit: Attending: Cardiology

## 2024-02-15 DIAGNOSIS — I480 Paroxysmal atrial fibrillation: Secondary | ICD-10-CM

## 2024-02-15 DIAGNOSIS — Z7901 Long term (current) use of anticoagulants: Secondary | ICD-10-CM

## 2024-02-15 LAB — POCT INR: INR: 2.6 (ref 2.0–3.0)

## 2024-02-15 NOTE — Patient Instructions (Signed)
 Continue taking 1/2 tablet daily except 1 tablet on Mondays and Fridays.  Recheck INR in 4 weeks.  Coumadin Clinic [phone]

## 2024-02-21 ENCOUNTER — Ambulatory Visit (INDEPENDENT_AMBULATORY_CARE_PROVIDER_SITE_OTHER): Payer: Medicare Other | Admitting: Internal Medicine

## 2024-02-21 ENCOUNTER — Encounter: Payer: Self-pay | Admitting: Internal Medicine

## 2024-02-21 VITALS — BP 126/78 | HR 77

## 2024-02-21 DIAGNOSIS — J441 Chronic obstructive pulmonary disease with (acute) exacerbation: Secondary | ICD-10-CM | POA: Diagnosis not present

## 2024-02-21 DIAGNOSIS — J9611 Chronic respiratory failure with hypoxia: Secondary | ICD-10-CM

## 2024-02-21 DIAGNOSIS — J449 Chronic obstructive pulmonary disease, unspecified: Secondary | ICD-10-CM

## 2024-02-21 MED ORDER — AZITHROMYCIN 250 MG PO TABS: ORAL_TABLET | ORAL | 0 refills | Status: DC

## 2024-02-21 MED ORDER — IPRATROPIUM BROMIDE 0.02 % IN SOLN: 0.5000 mg | Freq: Four times a day (QID) | RESPIRATORY_TRACT | 11 refills | Status: AC

## 2024-02-21 MED ORDER — PREDNISONE 10 MG PO TABS: ORAL_TABLET | ORAL | 0 refills | Status: DC

## 2024-02-21 NOTE — Progress Notes (Signed)
 OV 05/19/2016  Chief Complaint  Patient presents with   Follow-up    Pt c/o worsening SOB, prod cough with thick white mucus.      This is a routine follow-up. Last visit was in April 2017 with nurse practitioner. At that time treated for COPD exacerbation according to her history but review of the chart does not show that to be true. At this point in time she says COPD stable although she says she might be in flare up on account of her fibromyalgia. Starting her symptoms out it appears that it is generally stable with dyspnea at baseline and cough with mild sputum at baseline. She is more hobbled by her chronic pain and fibromyalgia. Couple months ago apparently a hydrocodone  was discontinued by rheumatology and therefore she is having "withdrawal". She does have a new pain medication physician. She is on gabapentin  for fibromyalgia. There are no other new issues. She does not want antibiotics or prednisone  for her perceived exacerbation  OV 11/17/2016  Chief Complaint  Patient presents with   Follow-up    Pt states her SOB has worsened since last OV. Pt states she has a burning in her chest when she becomes SOB. Pt c/o prod cough with white mucus in morning, cough becomes nonprod throughout the day.     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 Last CT chest March 2017 without any mass and associated mild cor pulmonale   Six-month follow-up visit. She is completely overwhelmed by her fibromyalgia and depression. The fibromyalgia is worse. She says she is change doctors because of this. She's had a few admissions in the interim but none of them give a COPD exacerbation according to chart review. She suffered frustrated by her heels oxygen system even though it is light it is causing her pain. She uses Atrovent  nebulizer but wants change to something else but at the same time has rejected use of any other  nebulizer or oral inhaler because of side effects. She is burning chest pain with inspiration and associated with her costochondral junction trigger points. 2014 review of the chart shows normal cardiac stress test. November 2017 chest x-ray is clear. She does not want any further imaging. Chest pain is mild to severe and variable. Worsened with inspiration. No radiation associated wheezing. No sputum production  OV 05/25/2017  Chief Complaint  Patient presents with   Follow-up    Pt states her SOB has worsened since last OV in 11/2016. Pt states she only coughs after her neb treatment - pt states her mucus is yellow in color and c/o occ chest discomfort. Pt denies f/c/s.     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 Last CT chest March 2017 without any mass and associated mild cor pulmonale   Follow-up exertional hypoxemia associated with his emphysema. Also associated with fibromyalgia. Last visit she had atypical chest pain. Recommended she see cardiology. 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 According to her history she has significant coronary artery disease but is fairly advanced. She is frustrated with this but realistic. She feels her days are number. In terms of COPD emphysema and is stable. She is on Atrovent  inhalers. She is on oxygen. She wants a lighter system. She still burden by fibromyalgia. 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  She uses oxygen and nebulizers.  She is not interested in rehab or vaccines.  New issue: Preoperative pulmonary evaluation.  She is having significant bilateral lower extremity claudication.  She states that she is in severe pain walking from her door to the mailbox to the point she is almost crying.  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  Apparently Dr. Charlotte Cookey wants to try a laparotomy approach.  However Dr. Nobie Batch might take her to the cardiac Cath Lab and placed stents.  She says she is sensitive to fentanyl  and is worried about anesthesia complications but the pain is so severe she is willing to take the risk.  She has had previous cardiac catheterization without any problems other than being sensitive to fentanyl .  She says this can be done in the cardiac Cath Lab with anesthesia support.  She wants me to talk to Dr. Annis Kinder and Dr. Charlotte Cookey about this.   OV 07/18/2018  Subjective:  Patient ID: Armond Lands, female , DOB: 06/03/1946 , age 78 y.o. , MRN: [id number] , ADDRESS: [address] [place] [address]   07/18/2018 -   Chief Complaint  Patient presents with   Follow-up    Pt states her chest is hurting her all the time now and states she does not think her neb solutions are working for her anymore now. Pt also states she has had some worsening SOB and is also coughing up white phlegm which is comes out in chunks.     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  She uses oxygen and nebulizers. Last CT chest March 2017 without any mass and associated mild cor pulmonale   HPI SHAENA PARKERSON 78 y.o. -after last visit she saw  nurse practitioner in June 2019 fora mild respiratory flare. At the time treated with allergy medications antihistamines.She tells me thathe last saw me January 2019 she had an iliac stent place in the left side and after that her effort tolerance is improved but in the last few months she's noticed a decrease in effort tolerance with worsening dyspnea and also increased cough and increased sputum production in volume and also consistency without change in color This no fever or weight loss. She will not have a flu shot because of prior allergy. Her last CT scan of the chest was in 2017 and she is requesting for another one. Her inhaler as ipratropium which she says she's not happy with. In the past she's uses Spiriva and Symbicort and these have caused blisters and so she is generally where you have inhalers although she wants something other than her current one.       08/19/2018  - Visit   [name].  Patient was last seen in our office visit on  07/18/2018 started on Bevespi .  Patient reports that after 1 day of use of the Bevespi  inhaler she developed thrush and mouth sores.  She she contacted our office to be treated with nystatin .  Patient reports that most mouth sores resolved there remains one.  Patient is also having extensive dental work done.  Patient also has been treated for scabies as well as impetigo by dermatology recently.   Patient completed a high-res CT in 07/29/2018 that showed no real changes from baseline.  Still showing severe emphysema.   Patient reports that she continues to use her Atrovent  nebulizer but is wondering if there is any other options available for her.  Patient feels that the Atrovent  nebulizer is not working as well.  Patient is currently using as needed and not using it scheduled.  MMRC - Breathlessness Score 3 - I stop for breath after walking about 100 yards or after a few minutes on level ground (isle at grocery  store is 163ft)  Patient reports that she has known triggers of shortness of breath with exertion, when there is high pollen counts, when she is walking, when she is outside for extended periods of time.  Patient reports she is been using 2 L via nasal cannula of oxygen with exertion as well as at rest.  Patient reports she forgot her POC at home.  She arrived to our office on room air.  Oxygen saturations 87.  Patient refused oxygen in our office and states that she does not think she needs oxygen right now.  Patient is sad and concerned regarding her limitations with vascular surgery.  Patient believes that she needs a surgery but reports that vascular team as well as cardiology does not think that she would be a good surgical candidate.  She reports that she has not heard back from Dr. Pearlean Botts office regarding her most recent test results.   Tests:  01/21/2016-CT chest without contrast- moderate centrilobular emphysema and diffuse bronchial wall thickening mild subpleural density in the dependent lower lobes  01/20/2016-pulmonary function test- airway obstruction and diffusion defect suggesting emphysema  Imaging:  02/11/2017-chest x-ray-stable large cardiac silhouette, lungs are hyperinflated, interstitial edema pattern unchanged from prior scans   Cardiac:  07/08/2017-echocardiogram-LV ejection fraction 50 to 55%, grade 1 diastolic dysfunction      OV 12/21/2018  Subjective:  Patient ID: Armond Lands, female , DOB: 04/08/1946 , age 47 y.o. 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  She uses oxygen and nebulizers. Last CT chest March 2017 without any mass and associated mild cor pulmonale  12/21/2018 -   Chief Complaint  Patient presents with   Follow-up    Pt states due to being switched to a  new medication, she has had labored breathing. SOB is with exertion, has an occ cough with white phlegm, and also has had some occ CP.     HPI CORTLYN CANNELL 78 y.o. -  Presents for routine follow-up.  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  COPD CAT score is 25 and she feels stable.  She uses oxygen sporadically and nebulizer sporadically.  She is very afraid of medicines because of multiple allergies.  She says her psychiatrist Dr. Afrin placed her on some medications that caused a rash.  Other than that she is okay.      OV  09/15/2019  Subjective:  Patient ID: Armond Lands, female , DOB: September 26, 1946 , age 37 y.o. 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  She uses oxygen and nebulizers. Last CT chest March 2017 without any mass and associated mild cor pulmonale   09/15/2019 -  No chief complaint on file.    HPI MAIJA BIGGERS 78 y.o. -     ROS - per HPI     OV 01/17/2020  Subjective:  Patient ID: Armond Lands, female , DOB: Jun 23, 1946 , age 19 y.o. , MRN: [id number] , ADDRESS: [address] [place] [address]   01/17/2020 -   Chief Complaint  Patient presents with   Follow-up     HPI ADANELY REYNOSO 78 y.o. -presents for face-to-face follow-up.  In December she called with a COPD flareup symptoms.  She wanted Biaxin  despite her allergies.  She feels that Biaxin  helps her.  She says she was given prednisone  which helped but she was only given generic clarithromycin  instead of the tradename Biaxin .  She says it did not work.  She wants tradename Biaxin  only.  She says since then her cough is more than baseline.  She also is like sputum that is discolored.  She feels like she is in exacerbation and this is preventing her from getting the COVID-19  vaccine.  She feels another course of tradename Biaxin  is required.  She again does not want generic clarithromycin .  She is compliant with her baseline nebulizer nighttime oxygen.  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  She is now recovering from that.  She talked about Covid vaccine.  She wants to get it.  He has had pneumonia vaccine without problem but recently flu shot she feels this put her in the hospital.  She also has multiple oral drug allergies.  Overall she continues to mask and follow social distancing.      12/19/2020 -   Chief Complaint  Patient presents with   Follow-up    Had Covid PNA 10/2020, doing some better     HPI MARK BENECKE 78 y.o. -presents for follow-up of her COPD.  She continues to use Atrovent  nebulizer.  She prefers nebulizers of inhaler.  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  She uses oxygen at night with exertion.  She tells me that in December 2021 around Christmas she had respiratory viral symptoms.  She says rapid antigen test by her daughter was negative.  Then in January 2022 she followed up with primary care physician who did a chest x-ray that showed pneumonia.  I have the chest x-ray with me visualized it.  There is an infiltrate.  She says she was told that she had Covid but there is no confirmatory evidence for this.  She treated herself at home.  She cannot have vaccines because of multiple drug allergies according to history.  I supported her in the decision making.  She is worried about abnormal chest x-ray.        CAT Score 12/19/2020  Total CAT Score 18       OV 01/30/2021  Subjective:  Patient ID: Armond Lands, female , DOB: [birth year] , age 3 y.o. , MRN: [id number] , ADDRESS: [address]  [place] [address] PCP Benjiman Bras, MD Patient Care Team: Benjiman Bras, MD as PCP - General (Family Medicine) Avanell Leigh, MD as  PCP - Cardiology (Cardiology) Raydell Cahill, MD as Referring Physician (Physical Medicine and Rehabilitation) Jolinda Necessary, MD (Inactive) as Consulting Physician (Gastroenterology) Lisabeth Rider, MD as Consulting Physician (Neurology) [place], Peter M, MD as Consulting Physician (Cardiology) Maire Scot, MD as Consulting Physician (Pulmonary Disease) Arturo Late, MD as Consulting Physician (Psychiatry) Avanell Leigh, MD as Consulting Physician (Peripheral Vascular Disease)  This Provider for this visit: Treatment Team:  Attending Provider: Maire Scot, MD    01/30/2021 -   Chief Complaint  Patient presents with   Follow-up    Doing ok, breathing is the same     HPI Armond Lands 78 y.o. -presents for follow-up for COPD.  Here to review the results.  Her daughter Cayla Wiegand is on the phone.  No new interim complaints.  A Covid IgG is negative thus making her recent viral infection is unlikely as Covid.  I offered a referral for Covid monoclonal antibody prophylaxis but she declined.  Her pulmonary function test shows 12% decline in FEV1 in 5 years.  I reviewed the chart and do not find an alpha-1 check.  I presume this was done prior to us  using electronic medical records.  She is open to getting it tested again.  She is to go since 2 COPD.  She tells me that the DME company is out of supply with tubing for her nebulizer.  She has used an inhaler but she is somewhat skeptical because of nasal septal perforation.  Explained to her inhalers oral.  She then talked about dentures.  Explained that we can do inhalers through an [facility].  She had high-resolution CT chest.  This shows emphysema.  There is evidence of cor pulmonale.  There is stable thoracic aorta aneurysm.  No fibrosis no cancer.   CAT Score 01/30/2021 12/19/2020  Total CAT Score 10 18        Ref Range & Units 1 mo ago  SARS COV1 AB(IGG)SPIKE,SEMI QN <1.00 index <1.00          IMPRESSION: 1. No evidence of interstitial lung disease. Air trapping is indicative of small airways disease. 2. Ascending aortic aneurysm, stable. Recommend annual imaging followup by CTA or MRA. This recommendation follows 2010 ACCF/AHA/AATS/ACR/ASA/SCA/SCAI/SIR/STS/SVM Guidelines for the Diagnosis and Management of Patients with Thoracic Aortic Disease. Circulation. 2010; 121: N562-Z308. Aortic aneurysm NOS (ICD10-I71.9). 3. Aortic atherosclerosis (ICD10-I70.0). Coronary artery calcification. 4. Enlarged pulmonic trunk, indicative of pulmonary arterial hypertension. 5.  Emphysema (ICD10-J43.9).     Electronically Signed   By: Shearon Denis M.D.   On: 01/15/2021 11:52     OV 07/22/2021  Subjective:  Patient ID: Armond Lands, female , DOB: 1945/11/08 , age 52 y.o. , MRN: [id number] , ADDRESS: [address] [place] [address] PCP Benjiman Bras, MD Patient Care Team: Benjiman Bras, MD as PCP - General (Family Medicine) [place], Peter M, MD as PCP - Cardiology (Cardiology) Raydell Cahill, MD as Referring Physician (Physical Medicine and Rehabilitation) Jolinda Necessary, MD (Inactive) as Consulting Physician (Gastroenterology) Lisabeth Rider, MD as Consulting Physician (Neurology) [place], Peter M, MD as Consulting Physician (Cardiology) Maire Scot, MD as Consulting Physician (Pulmonary Disease) Carlos Chesterfield Bronson Canny, MD as Consulting Physician (Psychiatry)  This Provider for this visit: Treatment Team:  Attending Provider: Maire Scot, MD    07/22/2021 -  Chief Complaint  Patient presents with   Follow-up    Pt states she had pneumonia since last visit. States she went to see PCP after finishing meds and was told she still had infection and was placed back on abx by PCP.   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   She uses oxygen and nebulizer  HPI MARJI KUEHNEL 78 y.o. -returns for follow-up.  On 06/18/2021 she called with COPD exacerbation symptoms.  Gave her Biaxin  and prednisone .  But she tells me that because the line was busy our office did not call in the medications till 5 days later when they got hold of her.  She says she is only partially improved and she is her primary care physician.  She had chest x-ray 07/16/2021.  This reports of interstitial prominence.  However there is no mass or consolidation.  She had basic labs that I reviewed and it is normal.  Apparently primary care physician gave her another Biaxin .  But there is no prednisone .  She still has yellow symptoms.  Symptoms only partially resolved.  She feels another round of prednisone  will help her.  Of note we put her on triple inhaler BREZTRI  last visit but this gave her thrush.  She is taking ipratropium.  If she only takes it 3 times a day when she has tremors that she is managing just with 2 times a day and this is insufficient.  She is using oxygen continuous now up to 2-1/2 L.  She is now moved downstairs because of her shortness of breath.  She is wanting oxygen concentrator upstairs and downstairs.  In the last few to several years she is refused flu shot.  She will not have the COVID-vaccine either.      OV 08/21/2021  Subjective:  Patient ID: Armond Lands, female , DOB: 1946/01/11 , age 24 y.o. , MRN: [id number] , ADDRESS: [address] [place] [address] PCP Benjiman Bras, MD Patient Care Team: Benjiman Bras, MD as PCP - General (Family Medicine) [place], Peter M, MD as PCP - Cardiology (Cardiology) Raydell Cahill, MD as Referring Physician (Physical Medicine and Rehabilitation) Jolinda Necessary, MD (Inactive) as Consulting Physician (Gastroenterology) Lisabeth Rider, MD as Consulting Physician (Neurology) [place], Peter M, MD as Consulting Physician (Cardiology) Maire Scot, MD as  Consulting Physician (Pulmonary Disease) Carlos Chesterfield Bronson Canny, MD as Consulting Physician (Psychiatry)  This Provider for this visit: Treatment Team:  Attending Provider: Maire Scot, MD  Follow-up emphysema with chronic hypoxemic restorative failure and associated fibromyalgia and associated chronic systolic heart failure: Overall doing well.  She uses oxygen and nebulizer  08/21/2021 -   Chief Complaint  Patient presents with   Follow-up    Pt was at the ED 10/17 after PCP sent her there. States that she has been using Yupelri  neb sol. States that it does make her feel dizzy but has helped with her breathing better than prior neb sol.     HPI TERRIAN RIDLON 78 y.o. -returns for follow-up to have a follow-up chest x-ray because of his concerns of interstitial edema versus atypical pneumonia at last visit.  I look to the follow-up chest x-ray.  Personal visualization shows it is unchanged.  I think this is a baseline based on the fact his CT scan of the chest in March 2022 look very similar.  She is feeling stable.  Also last  visit we put her on Yupelri  nebulizer.  Part of this follow-up was to see how she is doing with that.  She says it makes her dizzy but then after a while she is able to tolerate it.  So she is careful and some days she does not use it.  But overall she feels its more beneficial than ipratropium.  Therefore she wants to stick with it.  Of note on 08/18/2021 she did have a video visit with her psychiatrist for depression.  She also has had a skin biopsy in her left thigh and apparently this was infected and she is sent to the ED for IV antibiotics but there was 11-hour wait and she left AMA without being seen.  She is very upset about her experience.  She is reflecting on it and is wondering whether she should go to the medical board and file a formal complaint.  But overall from a respiratory standpoint she is stable.    The other issues that she wants to oxygen  concentrator's.  1 5 stairs and 1 for downstairs.  Last visit I thought I put an order in but she says at that never got it.  She feels service at [facility] is terrible.  I informed her that we will do the order again.          OV 11/13/2021  Subjective:  Patient ID: Armond Lands, female , DOB: 1945-11-18 , age 46 y.o. , MRN: [id number] , ADDRESS: [address] [place] [address] PCP Virgle Grime, MD Patient Care Team: Virgle Grime, MD as PCP - General (Internal Medicine) [place], Peter M, MD as PCP - Cardiology (Cardiology) Raydell Cahill, MD as Referring Physician (Physical Medicine and Rehabilitation) Jolinda Necessary, MD (Inactive) as Consulting Physician (Gastroenterology) Lisabeth Rider, MD as Consulting Physician (Neurology) [place], Peter M, MD as Consulting Physician (Cardiology) Maire Scot, MD as Consulting Physician (Pulmonary Disease) Carlos Chesterfield Bronson Canny, MD as Consulting Physician (Psychiatry)  This Provider for this visit: Treatment Team:  Attending Provider: Maire Scot, MD    11/13/2021 -   Chief Complaint  Patient presents with   Follow-up    Pt states her breathing has become a little worse since last visit. States she has had a virus for the past 10 days.   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  She uses oxygen and nebulizer  HPI KAREENA ARRAMBIDE 78 y.o. -returns for follow-up.  Is a 40-month routine follow-up.  She tells me that some of her neighbors got sick with the flu.  She had flulike symptoms which she is flu negative.  She was in bed for a week.  She felt it was mild.  She did not call us .  She is back to baseline now.  Overall she is stable.  She tells me that her biggest issue is the portable oxygen.  She says Medicare refused to give her 2 systems 1 5 stairs and 1 for downstairs.  Currently she  has a Lawyer inside the house that she plugs and uses it at night.  For exertion she uses a Helio system.  She feels that he does system using 100 pound.  Oxygen gallon tank that is outside on the patio.  She wants to bring this inside and then the DME company will run her house for upstairs and downstairs.  She says when she goes into the system  the DME company says she cannot revert back to her concentrator.  She is a little bit nervous about this approach with the biggest hold-up is that the DME company is asking for credit card information and she is worried about identity theft.  And price gouging   OV 05/22/2022  Subjective:  Patient ID: Armond Lands, female , DOB: [birth year] , age 38 y.o. , MRN: [id number] , ADDRESS: [address] [place] [address] PCP Virgle Grime, MD Patient Care Team: Virgle Grime, MD as PCP - General (Internal Medicine) [place], Peter M, MD as PCP - Cardiology (Cardiology) Raydell Cahill, MD as Referring Physician (Physical Medicine and Rehabilitation) Jolinda Necessary, MD (Inactive) as Consulting Physician (Gastroenterology) Lisabeth Rider, MD as Consulting Physician (Neurology) [place], Peter M, MD as Consulting Physician (Cardiology) Maire Scot, MD as Consulting Physician (Pulmonary Disease) Carlos Chesterfield Bronson Canny, MD as Consulting Physician (Psychiatry)  This Provider for this visit: Treatment Team:  Attending Provider: Maire Scot, MD  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  She uses oxygen and nebulizer . Last CT chest MArch 2022  05/22/2022 -   Chief Complaint  Patient presents with   Follow-up    Follow-up SOB, Coughing, wheezing     HPI ABIGAIL TEALL 78 y.o. -returns for follow-up.  This is a routine follow-up but she tells me that since early June 2023 she has been sick.   Apparently she finished grocery shopping and then came home had chills diarrhea and cough shortly after that with a severe cough she had hemoptysis that started and resolved a week later but she was in bed for 21 days.  She was increased use of nebulizer.  She believes it was a respiratory virus.  Apparently repeated COVID test was negative.  She was so ill that her daughters who live an hour and a half however dropping off food for her.  She lives alone.  Currently she is better but still having wheezing.  She is still using nebulizer twice daily.  She is also got postnasal drip and she is needing Benadryl  but the hemoptysis resolved.  She is willing to take prednisone .  She continues oxygen and her regular nebulizer and Singulair .  Last CT scan of the chest was over a year ago.  She is willing to get this repeated.   05/22/2022: OV with Dr. Bertrum Brodie. Intended to be routine follow up but she had been having sick symptoms since June 2023. She had gone grocery shopping and came home with chills, diarrhea, and cough. She also developed hemoptysis, which resolved after a week. Stated she was in bed for 21 days. Doing better at OV but still wheezing. She is also having postnasal drip. 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 Continue supplemental O2. Continue nebulized medications. Continued singulair . Plan for CT chest in 2-4 weeks.   06/09/2022: OV with Cobb NP for follow-up after being treated for slow to resolve AECOPD related to respiratory virus.  She completed prednisone  course and had scheduled CT in the interim.  CT scan showed a right middle lobe airspace disease, concerning for pneumonia.  She has not had any antibiotics up to this point.  Today, she reports still having some increased shortness of breath from her baseline.  She did feel better when she was on the prednisone .  She also has a productive cough with yellow to green sputum and chest congestion.  She has not had any further  episodes of hemoptysis.  She does have an occasional wheeze, which improves with her nebulizer treatments.  She denies any fevers, night sweats, orthopnea, leg swelling, URI symptoms, interim sick exposures.  She is using her DuoNeb 3 times a day.  She states that she is unable to tolerate any and all inhalers as they cause irritation to the back of her throat and mouth.  She takes Singulair  at bedtime.  She continues on her supplemental oxygen with activity and at night.  Usually uses 2-3 lpm.  Viral respiratory infection in June with persistent AECOPD. Treated with prednisone  taper in July. CT chest 7/28 revealed a new RML airspace consolidation, concerning for pna given her infectious symptoms. We will treat her with augmentin  7 day course. Advised to use mucinex to assist with mucociliary clearance. Repeat CT chest 6 weeks after completion of abx.  06/23/2022: Today - follow up Patient presents today for follow up after being treated for AECOPD and pneumonia. She reports that she has been feeling much better since she completed her abx and prednisone . Breathing is back to baseline and her cough is clearing up, usually with very pale yellow or clear sputum. She does still feel more fatigued than usually but feels like she's slowly building her stamina back up. Denies any fevers, chills, night sweats, hemoptysis, chest congestion, orthopnea, leg swelling. She continues to use her ipratropium neb for maintenance 3 times a day. Takes singulair  at bedtime. Maintaining oxygen on 2-3 lpm.    OV 08/27/2022  Subjective:  Patient ID: Armond Lands, female , DOB: 1946-10-28 , age 26 y.o. , MRN: [id number] , ADDRESS: [address] [place] [address] PCP Virgle Grime, MD Patient Care Team: Virgle Grime, MD as PCP - General (Internal Medicine) [place], Peter M, MD as PCP - Cardiology (Cardiology) Raydell Cahill, MD as Referring Physician (Physical Medicine and  Rehabilitation) Jolinda Necessary, MD (Inactive) as Consulting Physician (Gastroenterology) Lisabeth Rider, MD as Consulting Physician (Neurology) [place], Peter M, MD as Consulting Physician (Cardiology) Maire Scot, MD as Consulting Physician (Pulmonary Disease) Carlos Chesterfield Bronson Canny, MD as Consulting Physician (Psychiatry)  This Provider for this visit: Treatment Team:  Attending Provider: Maire Scot, MD  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  She uses oxygen and nebulizer . Last CT chest MArch 2022  08/27/2022 -   Chief Complaint  Patient presents with   Follow-up    Follow-up visit PT here to discuss results of CT scan Wants walk test to qualify for inogen      HPI DJUNA FRECHETTE 78 y.o. -returns for follow-up.  She states that she is now been approved by her [place] [employer] for [employer] oxygen system because the regular oxygen system is heavy.  She needs a qualifying walk otherwise shortness of breath is at baseline.  She had CT scan of the chest without contrast.  The right middle lobe pneumonia is resolved.  No pulmonary fibrosis no lung cancer no lung nodule no pneumonia anymore.  She does have aortic root dilatation 4.1 cm.  She continues her ipratropium and Singulair .  She is refused all vaccines because of multiple drug allergies.  Otherwise no new issues  Social: Her granddaughter age 75 has new diagnosis of breast cancer and she is upset about it.   CT Chest data 08/18/22  Narrative & Impression  CLINICAL DATA:  Pneumonia, complication suspected, xray  done   Community acquired pneumonia of right middle lobe.   EXAM: CT CHEST WITHOUT CONTRAST   TECHNIQUE: Multidetector CT imaging of the chest was performed following the standard protocol without IV contrast.   RADIATION DOSE REDUCTION: This exam was performed according to  the departmental dose-optimization program which includes automated exposure control, adjustment of the mA and/or kV according to patient size and/or use of iterative reconstruction technique.   COMPARISON:  CT 05/29/2022   FINDINGS: Cardiovascular: Fusiform aneurysmal dilatation of the ascending aorta maximal dimension 4.1 cm, previously 4 cm. The thoracic aorta is densely calcified and tortuous. The origin of the left subclavian artery is densely calcified. Dilated main pulmonary artery at 3.5 cm. Upper normal heart size with dense coronary artery calcifications. No pericardial effusion.   Mediastinum/Nodes: Scattered small nonenlarged mediastinal lymph nodes, stable from prior exam and likely reactive. No suspicious mediastinal adenopathy. Assessment for hilar adenopathy is limited on this unenhanced exam. Unremarkable appearance of the esophagus.   Lungs/Pleura: The previous right middle lobe airspace disease has resolved. No evidence of underlying mass. Moderate to advanced emphysema. Minor dependent atelectasis in the right lower lobe. No suspicious pulmonary nodule or mass. There is retained mucus within the right mainstem and lower lobe bronchus. No pleural fluid.   Upper Abdomen: No acute findings.   Musculoskeletal: Mild scoliosis with diffuse degenerative change in the thoracic spine. There are no acute or suspicious osseous abnormalities. No chest wall soft tissue abnormalities.   IMPRESSION: 1. Previous right middle lobe airspace disease has resolved. No residual or evidence of underlying mass. 2. 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 Recommend annual imaging followup by CTA or MRA. This recommendation follows 2010 ACCF/AHA/AATS/ACR/ASA/SCA/SCAI/SIR/STS/SVM Guidelines for the Diagnosis and Management of Patients with Thoracic Aortic Disease. Circulation. 2010; 121: G956-O130. Aortic aneurysm NOS  (ICD10-I71.9) 3. Aortic atherosclerosis and coronary artery calcifications. 4. Dilated main pulmonary artery suggesting pulmonary arterial hypertension. 5. Moderate to advanced emphysema   Aortic Atherosclerosis (ICD10-I70.0) and Emphysema (ICD10-J43.9).     Electronically Signed   By: Chadwick Colonel M.D.   On: 08/19/2022 20:37      OV 01/15/2023  Subjective:  Patient ID: Armond Lands, female , DOB: 1946/06/21 , age 64 y.o. , MRN: [id number] , ADDRESS: [address] [place] [address] PCP Virgle Grime, MD Patient Care Team: Virgle Grime, MD as PCP - General (Internal Medicine) [place], Peter M, MD as PCP - Cardiology (Cardiology) Raydell Cahill, MD as Referring Physician (Physical Medicine and Rehabilitation) Jolinda Necessary, MD (Inactive) as Consulting Physician (Gastroenterology) Lisabeth Rider, MD as Consulting Physician (Neurology) [place], Peter M, MD as Consulting Physician (Cardiology) Maire Scot, MD as Consulting Physician (Pulmonary Disease) Carlos Chesterfield Bronson Canny, MD as Consulting Physician (Psychiatry)  This Provider for this visit: Treatment Team:  Attending Provider: Maire Scot, MD    01/15/2023 -   Chief Complaint  Patient presents with   Follow-up    Patient saw PCP.  Treated for yeast infection.  Rx Flagyl and  Diflucan .  INR level at 7.6.  PCP d/c'd meds.  Diet a lot of greens.  Patient improving.  C/o wheeze and cough with yellow phlem.  Some weakness.   Fu emphysema  HPI RENN STILLE 78 y.o. -  presents for routine followup. Last few weeks was having yellow sputum and cough with this. DOes  not want antibioptics or prenisone due to side effect and yeast infection ris, Dyspnea is stable  though at 1-2L [place] at rest. Does chair exrcises but reduced to every other day to give recovery time. REcently with flagyl and diflucan  INR went up. She cannot afford eliquis she says.So tried to enroll in a clinical tril with Dr  Janett Medin but opted against it0due to AE risk concern      OV 05/20/2023  Subjective:  Patient ID: Armond Lands, female , DOB: 07-05-1946 , age 60 y.o. , MRN: [id number] , ADDRESS: [address] [place] [address] PCP Virgle Grime, MD Patient Care Team: Virgle Grime, MD as PCP - General (Internal Medicine) [place], Peter M, MD as PCP - Cardiology (Cardiology) Raydell Cahill, MD as Referring Physician (Physical Medicine and Rehabilitation) Jolinda Necessary, MD (Inactive) as Consulting Physician (Gastroenterology) Lisabeth Rider, MD as Consulting Physician (Neurology) [place], Peter M, MD as Consulting Physician (Cardiology) Maire Scot, MD as Consulting Physician (Pulmonary Disease) Carlos Chesterfield Bronson Canny, MD as Consulting Physician (Psychiatry)  This Provider for this visit: Treatment Team:  Attending Provider: Maire Scot, MD    05/20/2023 -   Chief Complaint  Patient presents with   Follow-up    Coughing up yellow.  Recently diagnosed with scoliosis.     HPI ANNALIE WENNER 78 y.o. -presents for follow-up.  She tells me that she feels she has ongoing exacerbation.  Sputum is still yellow.  In May 2024 we called in some clarithromycin  and prednisone  but she felt the course was too short.  She wants another round of antibiotics for sure.  Will also offer her some prednisone  if she wants she can take it.  Reviewed labs of previous eosinophils normal.  She does take Praluent .  I discussed the concept of biologic for COPD exacerbation prevention.  She is open to this idea and is willing to get CBC with differential checked.  Otherwise she is due for CT scan low-dose in the fall 2024 for lung cancer screening.    No other issues other than the fact in the interim for the last month she has had left-sided breast pain she has been given some increased Lasix  and she has an MRI pending with her primary care doctor.    LAB RESULTS last 96 hours No results  found.  LAB RESULTS last 90 days   Latest Reference Range & Units 05/28/09 11:45 04/09/10 08:50 07/10/11 19:24 09/27/12 11:27 05/04/13 10:40 03/29/14 09:32 12/06/14 09:15 07/11/15 08:28 08/03/16 16:40 08/18/16 08:43 09/01/16 11:58 02/11/17 01:46 08/29/18 06:31 11/22/19 18:07 08/18/21 12:26 12/05/21 21:32  Eosinophils Absolute 0.0 - 0.5 K/uL 0.1 0.1 0.2 0.2 0.1 0.1 0.1 0.1 0.0 67 0.1 0.1 0.1 0.1 0.3 0.1     OV 08/20/2023  Subjective:  Patient ID: Armond Lands, female , DOB: Nov 17, 1945 , age 78 y.o. , MRN: [id number] , ADDRESS: [address] [place] [address] PCP Virgle Grime, MD Patient Care Team: Virgle Grime, MD as PCP - General (Internal Medicine) [place], Peter M, MD as PCP - Cardiology (Cardiology) Raydell Cahill, MD as Referring Physician (Physical Medicine and Rehabilitation) Jolinda Necessary, MD (Inactive) as Consulting Physician (Gastroenterology) Lisabeth Rider, MD as Consulting Physician (Neurology) [place], Peter M, MD as Consulting Physician (Cardiology) Maire Scot, MD as Consulting Physician (Pulmonary Disease) Carlos Chesterfield Bronson Canny, MD as Consulting Physician (Psychiatry)  This Provider for this visit: Treatment Team:  Attending Provider: Maire Scot, MD    08/20/2023 -   Chief Complaint  Patient presents with   Follow-up    Increased DOE today bc she  is having stress at home and she was rushing this morning. She is here to review her LDCT.      HPI ASIA DUSENBURY 78 y.o. -here for follow-up  #COPD with chronic respiratory failure continues to be stable.  She will not have vaccines.  On current medications and she is stable no flareup currently  #Lung cancer screening she had a CT scan of the chest no evidence of lung cancer annual screening recommended but she is worried about radiation risk.  Did explain to her that [place]  preventive task force recommend CT scan till age 53 and she is 54 right now and no further  CT scan required but according to American Cancer Society [insurance not paying for this recommendation] CT scan recommended low-dose till age 82.  We then took a shared decision making for her to stop lung cancer screening.  This is predominantly out of a concern and I felt this was acceptable risk  #Respiratory vaccines: She has declined this  #Social stressors she is having a lot of social stressors right now.  Fibromyalgia is acting up.  In fact after blood pressure cuff application she started crying because it caused her pain.  When I touched her right arm she winced in pain.  She tells me that on July 24, 2019 for 1 day after her daughter turned 54 daughter had a stroke and currently cannot cook.  The daughter has autistic grandchildren.  She had to send money to her granddaughter to get diapers.  In addition the granddaughter isfrom her husband.  Patient is very upset about all these.   CT Chest data from date: 08/19/23 Narrative & Impression  CLINICAL DATA:  90 pack-year smoking history/quit 13 years ago.   EXAM: CT CHEST WITHOUT CONTRAST LOW-DOSE FOR LUNG CANCER SCREENING   TECHNIQUE: Multidetector CT imaging of the chest was performed following the standard protocol without IV contrast.   RADIATION DOSE REDUCTION: This exam was performed according to the departmental dose-optimization program which includes automated exposure control, adjustment of the mA and/or kV according to patient size and/or use of iterative reconstruction technique.   COMPARISON:  08/18/2022 chest CT, diagnostic. No prior screening CT.   FINDINGS: Cardiovascular: No change in mild ascending aortic dilatation at 4.1 cm. Aortic atherosclerosis. Tortuous thoracic aorta. Moderate cardiomegaly, without pericardial effusion. Three vessel coronary artery calcification.   Pulmonary artery enlargement, outflow tract 3.9 cm.   Mediastinum/Nodes: No mediastinal or hilar adenopathy, given limitations of  unenhanced CT.   Lungs/Pleura: No pleural fluid. Moderate centrilobular emphysema. Minimal motion degradation.   Isolated left lower lobe pulmonary nodule of 3.9 mm.   Upper Abdomen: Cholecystectomy. Normal imaged portions of the spleen, stomach, pancreas, adrenal glands, kidneys.   Musculoskeletal: Midthoracic spondylosis with mild S-shaped spinal curvature.   IMPRESSION: 1. Lung-RADS 2, benign appearance or behavior. Continue annual screening with low-dose chest CT without contrast in 12 months. 2. Pulmonary artery enlargement suggests pulmonary arterial hypertension. 3. No change in mild ascending aortic dilatation at 4.1 cm. This can be re-evaluated on routine lung cancer screening follow-up at 12 months. 4.  Emphysema (ICD10-J43.9).  Aortic Atherosclerosis (ICD10-I70.0).     Electronically Signed   By: Lore Rode M.D.   On: 08/19/2023 10:30           OV 02/21/2024  Subjective:  Patient ID: Armond Lands, female , DOB: Jan 28, 1946 , age 85 y.o. , MRN: [id number] , ADDRESS: [address] [place] [address] PCP Gloria Lares,  Doyle Generous, MD Patient Care Team: Virgle Grime, MD as PCP - General (Internal Medicine) [place], Peter M, MD as PCP - Cardiology (Cardiology) Raydell Cahill, MD as Referring Physician (Physical Medicine and Rehabilitation) Jolinda Necessary, MD (Inactive) as Consulting Physician (Gastroenterology) Lisabeth Rider, MD as Consulting Physician (Neurology) [place], Peter M, MD as Consulting Physician (Cardiology) Maire Scot, MD as Consulting Physician (Pulmonary Disease) Arturo Late, MD as Consulting Physician (Psychiatry)  This Provider for this visit: Treatment Team:  Attending Provider: Maire Scot, MD    02/21/2024 -   Chief Complaint  Patient presents with   Follow-up    Increased SOB since beginning on March 2025. She gets winded walking up stairs and with trying to cook a meal. She also c/o wheezing, cough  with yellow sputum.       HPI BRAELEY BUSKEY 78 y.o. -follow-up advanced COPD with multiple drug allergy history.  She is on chronic oxygen.  Since her last visit she ended up in the hospital early March 2025 with paroxysmal atrial fibrillation with rapid ventricular rate.  She is having medication intolerance.  She is now on Lopressor  and Cardizem .  She says she is not tolerating it well.  But she feels she has no other choice.  She is afraid of cardioversion.  She is afraid of taking other medications including amiodarone.  She has appointment with Dr. Lawana Pray tomorrow.  In terms of her COPD since early March 2025 she is coughing more than usual little bit more congested and having yellow sputum more than usual.  She feels her course of azithromycin  and prednisone  will help her.  She has multiple drug allergies but she states she has had these medicines in the past without problems.  She is agreeable to take these medications.  She wants a refill of her ipratropium.  External records reviewed for this encounter.    PFT     Latest Ref Rng & Units 01/30/2021    8:51 AM 01/20/2016    9:35 AM 08/14/2013   10:17 AM  PFT Results  FVC-Pre L 2.61  2.79    FVC-Predicted Pre % 92  93  100   FVC-Post L   3.07   FVC-Predicted Post %   103   Pre FEV1/FVC % % 63  67  62   Post FEV1/FCV % %   64   FEV1-Pre L 1.63  1.86  1.84   FEV1-Predicted Pre % 77  82  82   FEV1-Post L   1.97   DLCO uncorrected ml/min/mmHg 9.45  9.75  11.57   DLCO UNC% % 49  40  50   DLCO corrected ml/min/mmHg 9.45     DLCO COR %Predicted % 49     DLVA Predicted % 57  51  58   TLC L 4.59  4.62  4.32   TLC % Predicted % 90  91  88   RV % Predicted % 80  78  77        LAB RESULTS last 96 hours No results found.       has a past medical history of Acute diastolic heart failure, NYHA class 1 (HCC) (02/08/2017), Acute heart failure (HCC) (02/08/2017), Cataract, CHF (congestive heart failure) (HCC) (03/02/2017),  Complication of anesthesia, COPD (chronic obstructive pulmonary disease) (HCC), Depression, Depression, Diverticulitis, DVT (deep venous thrombosis) (HCC), Fibromyalgia, GERD (gastroesophageal reflux disease), Heart murmur, Hiatal hernia, History of deviated nasal septum, HTN (hypertension), Hyperlipidemia, Hypertension, Hypertensive retinopathy, Myocardial infarction (HCC), Obesity, On  supplemental oxygen therapy, Osteoporosis, Osteoporosis, Oxygen deficiency, PAT (paroxysmal atrial tachycardia) (HCC), PFO (patent foramen ovale), PULMONARY NODULE, LEFT LOWER LOBE (10/14/2009), 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   reports that she quit smoking about 13 years ago. Her smoking use included cigarettes. She started smoking about 43 years ago. She has a 90 pack-year smoking history. She has never used smokeless tobacco.  Past Surgical History:  Procedure Laterality Date   ABDOMINAL HYSTERECTOMY N/A    Phreesia 11/04/2020   CARDIOVASCULAR STRESS TEST  12/26/2004   EF 74%. NO EVIDENCE OF ISCHEMIA   CATARACT EXTRACTION Left    Dr. Meridee Standing   ESOPHAGOGASTRODUODENOSCOPY (EGD) WITH PROPOFOL  N/A 04/15/2015   Procedure: ESOPHAGOGASTRODUODENOSCOPY (EGD) WITH PROPOFOL ;  Surgeon: Jolinda Necessary, MD;  Location: [facility];  Service: Endoscopy;  Laterality: N/A;   EYE SURGERY Left    Cat Sx   JOINT REPLACEMENT N/A    Phreesia 11/04/2020   KNEE ARTHROSCOPY  2000   left   LAPAROSCOPIC CHOLECYSTECTOMY  04-16-2010   [facility]   LOWER EXTREMITY ANGIOGRAPHY N/A 09/09/2017   Procedure: Lower Extremity Angiography;  Surgeon: Avanell Leigh, MD;  Location: [facility]VASIVE CV LAB;  Service: Cardiovascular;  Laterality: N/A;   LOWER EXTREMITY INTERVENTION Left 01/17/2018   Procedure: LOWER EXTREMITY INTERVENTION;  Surgeon: Avanell Leigh, MD;  Location: [facility] INVASIVE CV LAB;  Service: Cardiovascular;   Laterality: Left;   MOUTH SURGERY     03-26-15 multiple extractions stitches remains   PERIPHERAL VASCULAR INTERVENTION Left 01/17/2018   Procedure: PERIPHERAL VASCULAR INTERVENTION;  Surgeon: Avanell Leigh, MD;  Location: [facility] INVASIVE CV LAB;  Service: Cardiovascular;  Laterality: Left;  COMMON ILIAC   RIGHT/LEFT HEART CATH AND CORONARY ANGIOGRAPHY N/A 03/04/2017   Procedure: Right/Left Heart Cath and Coronary Angiography;  Surgeon: Peter M [place], MD;  Location: [facility] INVASIVE CV LAB;  Service: Cardiovascular;  Laterality: N/A;   TOTAL ABDOMINAL HYSTERECTOMY     post op needed oxygen was told "she gave them a scare"   TUBAL LIGATION     US  ECHOCARDIOGRAPHY  11/20/2009   EF 55-60%    Allergies  Allergen Reactions   Bee Venom Anaphylaxis   Iodine  Anaphylaxis and Swelling    Throat swelling   Sudafed [Pseudoephedrine] Shortness Of Breath and Palpitations   Tessalon [Benzonatate] Rash   Xanax [Alprazolam] Anaphylaxis and Other (See Comments)    Respiratory arrest   Budesonide -Formoterol  Fumarate Other (See Comments)    Blisters inside of mouth all over   Crestor  [Rosuvastatin  Calcium ] Other (See Comments)    Severe leg weakness Unable to walk   Flonase [Fluticasone Propionate] Other (See Comments)    Epistaxis    Lamictal  [Lamotrigine ] Diarrhea, Rash and Other (See Comments)    Difficulty breathing   Lotrimin [Clotrimazole] Other (See Comments)    Mouth blisters   Lunesta [Eszopiclone] Other (See Comments)    REACTION: "slept for a week"   Oxcarbazepine Other (See Comments)    Causes deep sleep and dizziness   Statins Other (See Comments)    Severe leg weakness Unable to walk   Ambien [Zolpidem Tartrate] Other (See Comments)    "Slept for a week"   Betadine [Povidone Iodine ] Other (See Comments)    Breathing problems   Bevespi  Aerosphere [Glycopyrrolate -Formoterol ] Other (See Comments)    Pt believes this caused mouth sores and thrush    Biaxin  [Clarithromycin ] Other (See  Comments)    All "  mycins", Puts into "a" fib, Will take if has to for severe sinus infection   Breztri  Aerosphere [Budeson-Glycopyrrol-Formoterol ] Other (See Comments)    Thrush   Claritin-D [Loratadine-Pseudoephedrine Er] Other (See Comments)    Tremors, shaking   Effexor  [Venlafaxine ] Nausea And Vomiting and Other (See Comments)    cramps   Lexapro  [Escitalopram  Oxalate] Other (See Comments)    Hallucinations    Nexium [Esomeprazole Magnesium ] Other (See Comments)    Hyperactivity    Aciphex [Rabeprazole Sodium] Rash   Avelox [Moxifloxacin Hcl In Nacl] Other (See Comments)    Stomach cramps   Bentyl [Dicyclomine] Rash   Bextra [Valdecoxib] Rash   Ceclor [Cefaclor] Rash   Covera-Hs  [Verapamil  Hcl] Palpitations   Dicyclomine Hcl Rash   Estrace [Estradiol] Other (See Comments)    Breast soreness (severe).   Fosamax  [Alendronate  Sodium] Other (See Comments)    Stomach issues   Keflex [Cephalexin] Rash and Other (See Comments)    Pt states that she is possibly allergic to this - had a reaction to Cefaclor in the past and she does not want to these class drugs. Added per patient request.   Other Other (See Comments)    Glue from ekg/heart monitor leads --rash, Any MYCINS    Immunization History  Administered Date(s) Administered   Influenza Split 08/03/2011, 08/01/2012   Influenza,inj,Quad PF,6+ Mos 08/14/2013, 09/03/2014, 09/04/2015, 08/18/2016   Influenza-Unspecified 11/03/1999   Pneumococcal Conjugate-13 10/01/2014   Pneumococcal Polysaccharide-23 09/03/1999, 06/02/2006, 08/14/2013   Td 11/02/1994   Tdap 08/18/2016    Family History  Problem Relation Age of Onset   Dementia Mother    Diabetes Mother    Alzheimer's disease Mother    Heart attack Brother 13   Heart attack Father    Schizophrenia Sister    Diabetes Sister    Tremor Sister      Current Outpatient Medications:    acetaminophen  (TYLENOL ) 500 MG tablet, Take 1,000 mg by mouth 2 (two) times daily as  needed for moderate pain (pain score 4-6) or headache., Disp: , Rfl:    azithromycin  (ZITHROMAX ) 250 MG tablet, Azithromycin  500 mg on day 1 followed by 250 mg daily from day 2 through day 5, Disp: 6 tablet, Rfl: 0   clopidogrel  (PLAVIX ) 75 MG tablet, TAKE 1 TABLET BY MOUTH EVERY DAY, Disp: 30 tablet, Rfl: 11   desvenlafaxine  (PRISTIQ ) 25 MG 24 hr tablet, Take 1 tablet (25 mg total) by mouth daily. (Patient taking differently: Take 25 mg by mouth daily at 4 PM.), Disp: 30 tablet, Rfl: 2   diltiazem  (CARDIZEM  CD) 180 MG 24 hr capsule, Take 1 capsule (180 mg total) by mouth daily., Disp: 90 capsule, Rfl: 3   diltiazem  (CARDIZEM ) 30 MG tablet, Take 1 tablet (30 mg total) by mouth every 8 (eight) hours as needed (Heart rate greater than 130). (Patient taking differently: Take 30 mg by mouth as needed (Heart rate greater than 130).), Disp: 90 tablet, Rfl: 2   Evolocumab  (REPATHA  SURECLICK) 140 MG/ML SOAJ, Inject 140 mg into the skin every 14 (fourteen) days., Disp: 6 mL, Rfl: 1   furosemide  (LASIX ) 40 MG tablet, TAKE 1 TABLET BY MOUTH EVERY DAY, Disp: 90 tablet, Rfl: 2   gabapentin  (NEURONTIN ) 100 MG capsule, Take 200 mg by mouth as needed (neuropathy)., Disp: , Rfl:    HYDROcodone -acetaminophen  (NORCO/VICODIN) 5-325 MG tablet, Take 1 tablet by mouth 2 (two) times daily., Disp: , Rfl:    ipratropium (ATROVENT ) 0.02 % nebulizer solution, Take 2.5 mLs (0.5  mg total) by nebulization 4 (four) times daily., Disp: 300 mL, Rfl: 11   metoprolol  tartrate (LOPRESSOR ) 25 MG tablet, Take 1 tablet (25 mg total) by mouth 2 (two) times daily., Disp: 180 tablet, Rfl: 3   montelukast  (SINGULAIR ) 10 MG tablet, TAKE 1 TABLET BY MOUTH EVERYDAY AT BEDTIME (Patient taking differently: Take 10 mg by mouth daily.), Disp: 90 tablet, Rfl: 2   nitroGLYCERIN  (NITROSTAT ) 0.4 MG SL tablet, PLACE 1 TABLET UNDER THE TONGUE EVERY 5 MINUTES AS NEEDED FOR CHEST PAIN., Disp: 25 tablet, Rfl: 7   OXYGEN, Inhale 2 L/min into the lungs  continuous., Disp: , Rfl:    pantoprazole  (PROTONIX ) 40 MG tablet, TAKE 1 TABLET BY MOUTH EVERY DAY, Disp: 30 tablet, Rfl: 5   polyethylene glycol (MIRALAX  / GLYCOLAX ) 17 g packet, Take 17 g by mouth every other day., Disp: , Rfl:    predniSONE  (DELTASONE ) 10 MG tablet, Please take prednisone  40 mg x1 day, then 30 mg x1 day, then 20 mg x1 day, then 10 mg x1 day, and then 5 mg x1 day and stop, Disp: 11 tablet, Rfl: 0   spironolactone  (ALDACTONE ) 25 MG tablet, TAKE 1/2 TABLET BY MOUTH EVERY DAY, Disp: 15 tablet, Rfl: 11   traZODone  (DESYREL ) 100 MG tablet, Take one tab daily as needed for sleep (Patient taking differently: Take 100 mg by mouth at bedtime.), Disp: 30 tablet, Rfl: 2   warfarin (COUMADIN ) 5 MG tablet, TAKE ONE-HALF TO 1 TABLET BY MOUTH DAILY OR AS DIRECTED BY CLINIC (Patient taking differently: Take 2.5-5 mg by mouth See admin instructions. Take 1/2 tablet (2.5mg ) by mouth at 1600 on all days EXCEPT take 1 tablet (5mg ) on Monday and Friday only.), Disp: 30 tablet, Rfl: 3      Objective:   Vitals:   02/21/24 0921  BP: 126/78  Pulse: 77  SpO2: 91%    Estimated body mass index is 32.95 kg/m as calculated from the following:   Height as of 01/24/24: 5\' 3"  (1.6 m).   Weight as of 01/24/24: 186 lb (84.4 kg).  @WEIGHTCHANGE @  There were no vitals filed for this visit.   Physical Exam   General: No distress. Lookss same O2 at rest: YES Cane present: no Sitting in wheel chair: no Frail: no Obese: no Neuro: Alert and Oriented x 3. GCS 15. Speech normal Psych: Pleasant Resp:  Barrel Chest - YES.  Wheeze - yes x 1 area, Crackles - no, No overt respiratory distress CVS: Normal heart sounds. Murmurs - n Ext: Stigmata of Connective Tissue Disease - ono HEENT: Normal upper airway. PEERL +. No post nasal drip        Assessment:       ICD-10-CM   1. Chronic respiratory failure with hypoxia (HCC)  J96.11     2. 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 COPD with acute  exacerbation (HCC)  J44.1          Plan:     Patient Instructions     ICD-10-CM   1. Chronic respiratory failure with hypoxia (HCC)  J96.11     2. 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 Multiple drug allergies  Z88.9     4. Screening for lung cancer  Z12.2      Too bad now having A Fib issues. REgarding lungs   Chronic respiratory failure with hypoxia (HCC) Emphysema  -likely low grade flare up - lot of emotional stressors now  Plan -  Please take prednisone  40 mg x1 day, then 30 mg x1 day, then 20 mg x1 day, then 10 mg x1 day, and then 5 mg x1 day and stop - Azithromycin  Z Pak - Continue ipratropium 3 mL every 6 hours as needed for shortness of breath or wheezing. Use twice daily for maintenance    - send prescriptions - STOP singulair  10 mg At bedtime -   - not sure if it is helping you - Continue supplemental oxygen 2-3 lpm with activity and at night. Goal oxygen >88-90%  - Continue Mucinex 600 mg Twice daily for chest congestion/cough   Vaccine counseling Multiple drug allergies  -Respect hesitancy towards COVID, RSV and flu vaccine  Plan - Monitor clinically   Screening for lung cancer  -No evidence of pneumonia on CT scan of the chest October 2024.  No lung nodules no lung cancer.  No pulmonary fibrosis  - -No further CT scans per USPTF Ria Chad) after age 46 for lung cancer screening though [employer] wants you to have yearly scans till age 83  plan - no futher CT scan for this based on shared decision making -Smoking to remain in remission     Follow up -6 months or soooner if needed   FOLLOWUP No follow-ups on file.    SIGNATURE    Dr. Maire Scot, M.D., F.C.C.P,  Pulmonary and Critical Care Medicine Staff Physician, [facility] Director - Interstitial Lung Disease  Program  Pulmonary Fibrosis [facility] at [facility] [place], [place], [address]  Pager: [phone], If no answer or  between  15:00h - 7:00h: call [phone] Telephone: [phone]  10:21 AM 02/21/2024

## 2024-02-21 NOTE — Patient Instructions (Addendum)
 ICD-10-CM   1. Chronic respiratory failure with hypoxia (HCC)  J96.11     2. 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 Multiple drug allergies  Z88.9     4. Screening for lung cancer  Z12.2      Too bad now having A Fib issues. REgarding lungs   Chronic respiratory failure with hypoxia (HCC) Emphysema  -likely low grade flare up - lot of emotional stressors now  Plan - Please take prednisone  40 mg x1 day, then 30 mg x1 day, then 20 mg x1 day, then 10 mg x1 day, and then 5 mg x1 day and stop - Azithromycin  Z Pak - Continue ipratropium 3 mL every 6 hours as needed for shortness of breath or wheezing. Use twice daily for maintenance    - send prescriptions - STOP singulair  10 mg At bedtime -   - not sure if it is helping you - Continue supplemental oxygen 2-3 lpm with activity and at night. Goal oxygen >88-90%  - Continue Mucinex 600 mg Twice daily for chest congestion/cough   Vaccine counseling Multiple drug allergies  -Respect hesitancy towards COVID, RSV and flu vaccine  Plan - Monitor clinically   Screening for lung cancer  -No evidence of pneumonia on CT scan of the chest October 2024.  No lung nodules no lung cancer.  No pulmonary fibrosis  - -No further CT scans per USPTF Ria Chad) after age 78 for lung cancer screening though [employer] wants you to have yearly scans till age 54  plan - no futher CT scan for this based on shared decision making -Smoking to remain in remission     Follow up -6 months or soooner if needed

## 2024-02-22 ENCOUNTER — Encounter: Payer: Self-pay | Admitting: Cardiology

## 2024-02-22 ENCOUNTER — Telehealth: Payer: Self-pay | Admitting: *Deleted

## 2024-02-22 ENCOUNTER — Ambulatory Visit: Attending: Cardiology | Admitting: Cardiology

## 2024-02-22 VITALS — BP 154/90 | HR 90 | Ht 63.0 in | Wt 183.0 lb

## 2024-02-22 DIAGNOSIS — D6869 Other thrombophilia: Secondary | ICD-10-CM

## 2024-02-22 DIAGNOSIS — I10 Essential (primary) hypertension: Secondary | ICD-10-CM

## 2024-02-22 DIAGNOSIS — I480 Paroxysmal atrial fibrillation: Secondary | ICD-10-CM

## 2024-02-22 DIAGNOSIS — I5022 Chronic systolic (congestive) heart failure: Secondary | ICD-10-CM | POA: Diagnosis not present

## 2024-02-22 MED ORDER — DILTIAZEM HCL ER COATED BEADS 240 MG PO CP24: 240.0000 mg | ORAL_CAPSULE | Freq: Every day | ORAL | 3 refills | Status: DC

## 2024-02-22 NOTE — Progress Notes (Signed)
 Electrophysiology Office Note:   Date:  02/22/2024  ID:  Lavon, Horn November 09, 1945, MRN [id number]  Primary Cardiologist: Peter Swaziland, MD Primary Heart Failure: None Electrophysiologist: Ellah Otte Cortland Ding, MD      History of Present Illness:   FREYJA GOVEA is a 78 y.o. female with h/o hypertension, aortic and coronary atherosclerosis, tobacco use, chronic systolic heart failure with recovered EF, CVA, PAD with right retinal artery occlusion, hyperlipidemia, oxygen dependent COPD, fibromyalgia, atrial fibrillation/flutter seen today for  for Electrophysiology evaluation of atrial fibrillation at the request of Waynette Hait.    She was hospitalized March 2025 with atrial fibrillation.  She converted to sinus rhythm spontaneously on admission.  She was seen by cardiology for follow-up and was noted to be in atrial flutter.  She was switched to metoprolol .  She is continue to have episodes of atrial fibrillation since discharge.  She feels that the combination of diltiazem  and metoprolol  have been helping with her heart rate.  She feels dizzy and short of breath when she is out of rhythm.  Today, denies symptoms of chest pain, shortness of  lower extremity edema, claudication, dizziness, presyncope, syncope, bleeding, or neurologic sequela. The patient is tolerating medications without difficulties.  She states that she has been having issues with shortness of breath.  She is having a COPD exacerbation currently and is going to start prednisone  and a Z-Pak today.  She feels that she is in atrial fibrillation.  She has no chest pain, but does have significant shortness of breath as above.  She is unclear as she wants to plan for rhythm control at this time.  Review of systems complete and found to be negative unless listed in HPI.   EP Information / Studies Reviewed:    EKG is not ordered today. EKG from 01/24/2024 reviewed which showed sinus rhythm        Risk  Assessment/Calculations:    CHA2DS2-VASc Score = 8   This indicates a 10.8% annual risk of stroke. The patient's score is based upon: CHF History: 1 HTN History: 1 Diabetes History: 0 Stroke History: 2 Vascular Disease History: 1 Age Score: 2 Gender Score: 1            Physical Exam:   VS:  BP (!) 154/90 (BP Location: Right Arm, Patient Position: Sitting, Cuff Size: Normal)   Pulse 90   Ht 5\' 3"  (1.6 m)   Wt 183 lb (83 kg)   SpO2 97%   BMI 32.42 kg/m    Wt Readings from Last 3 Encounters:  02/22/24 183 lb (83 kg)  01/24/24 186 lb (84.4 kg)  01/19/24 187 lb (84.8 kg)     GEN: Well nourished, well developed in no acute distress NECK: No JVD; No carotid bruits CARDIAC: Irregularly irregular rate and rhythm, no murmurs, rubs, gallops RESPIRATORY:  Clear to auscultation without rales, wheezing or rhonchi  ABDOMEN: Soft, non-tender, non-distended EXTREMITIES:  No edema; No deformity   ASSESSMENT AND PLAN:    1.  Paroxysmal atrial fibrillation/flutter: She is in atrial fibrillation today by auscultation.  She feels short of breath and is starting treatment for COPD exacerbation.  This may be contributing to her arrhythmia.  For improved rate control, Emmah Bratcher increase diltiazem  to 240 mg daily.  We did discuss the possibility of admission for dofetilide, but she is not ready for admission at this time.  Her QT is mildly prolonged, but this is her best option for rhythm control.  For now, we  Marget Outten plan for a rate control strategy.  Zanyiah Posten have her follow-up in A-fib clinic for further adjustment of both metoprolol  and diltiazem .  May need to be careful with metoprolol  due to COPD.  2.  Secondary hypercoagulable state: Currently on warfarin for atrial fibrillation  3.  Chronic systolic heart failure: Ejection fraction 25 to 30%, though has had a recovered ejection fraction.  Plan per primary cardiology.  4.  PFO: No plans for closure  5.  Peripheral arterial disease: Status post  atherectomy.  Plan per cardiology  6.  Hypertension: Elevated today.  Quilla Freeze reassess after therapy for COPD  Follow up with Afib Clinic in 4 weeks  Signed, Kylieann Eagles Cortland Ding, MD

## 2024-02-22 NOTE — Telephone Encounter (Signed)
 Patient called to report that she started zpak 250mg  for 5 days and prednisone  taper x 5 days per her Pulmonologist. She states she did not have the doseage but would be downstairs at 1pm and to call back at that time. She is aware the medication does interact with warfarin.  Found the doseage in the system and prednisone  10mg -40mg x1, 30mg x1, 20mg x1, 10mg x1, 5mg x1 then complete, Zpak 250mg  take 2 tabs on day 1 then 1 tablet daily until complete.   Advised pt since the prednisone  will cause the INR to increase then will need to have some leafy veggies while on this and not to take any warfarin today then take 1/2 tablet daily this week then resume taking 1/2 tablet daily except 1 tablet on Monday and Friday and recheck INR in 1 week.

## 2024-02-22 NOTE — Patient Instructions (Signed)
 Medication Instructions:  Your physician has recommended you make the following change in your medication: INCREASE Diltiazem  to 240 mg once a day  *If you need a refill on your cardiac medications before your next appointment, please call your pharmacy*   Lab Work: None ordered   Testing/Procedures: None ordered   Follow-Up: At [facility], you and your health needs are our priority.  As part of our continuing mission to provide you with exceptional heart care, we have created designated Provider Care Teams.  These Care Teams include your primary Cardiologist (physician) and Advanced Practice Providers (APPs -  Physician Assistants and Nurse Practitioners) who all work together to provide you with the care you need, when you need it.  Your next appointment:   2 - 4 week(s)  The format for your next appointment:   In Person  Provider:   You will follow up in the Atrial Fibrillation Clinic located at [facility]. Your provider will be: Clint R. Fenton, PA-C or Minnie Amber, PA-C    Thank you for choosing [employer]!!   Reece Cane, RN [phone]

## 2024-02-24 ENCOUNTER — Encounter: Payer: Self-pay | Admitting: Internal Medicine

## 2024-02-24 DIAGNOSIS — R21 Rash and other nonspecific skin eruption: Secondary | ICD-10-CM

## 2024-02-24 DIAGNOSIS — J441 Chronic obstructive pulmonary disease with (acute) exacerbation: Secondary | ICD-10-CM

## 2024-02-25 MED ORDER — PREDNISONE 10 MG PO TABS: ORAL_TABLET | ORAL | 0 refills | Status: DC

## 2024-02-25 MED ORDER — PREDNISONE 10 MG PO TABS: 10.0000 mg | ORAL_TABLET | Freq: Every day | ORAL | 0 refills | Status: DC

## 2024-02-25 NOTE — Telephone Encounter (Signed)
 Ok stop the azitromycin  Repeat  Please take prednisone  40 mg x1 day, then 30 mg x1 day, then 20 mg x1 day, then 10 mg x1 day, and then 5 mg x1 day and stop

## 2024-02-27 ENCOUNTER — Encounter: Payer: Self-pay | Admitting: Internal Medicine

## 2024-03-01 NOTE — Telephone Encounter (Signed)
 What medicine is she talking about?  Is it prednisone ?  If it is prednisone  -  Please take prednisone  40 mg x1 day, then 30 mg x1 day, then 20 mg x1 day, then 10 mg x1 day, and then 5 mg x1 day and stop

## 2024-03-07 ENCOUNTER — Ambulatory Visit
Admission: RE | Admit: 2024-03-07 | Discharge: 2024-03-07 | Disposition: A | Discharge status: Home / Self Care | Source: Ambulatory Visit | Attending: Internal Medicine | Admitting: Internal Medicine

## 2024-03-07 ENCOUNTER — Telehealth: Payer: Self-pay

## 2024-03-07 ENCOUNTER — Ambulatory Visit

## 2024-03-07 VITALS — BP 144/84 | HR 78 | Ht 63.0 in | Wt 186.4 lb

## 2024-03-07 DIAGNOSIS — I4891 Unspecified atrial fibrillation: Secondary | ICD-10-CM | POA: Diagnosis not present

## 2024-03-07 DIAGNOSIS — Z7901 Long term (current) use of anticoagulants: Secondary | ICD-10-CM | POA: Diagnosis not present

## 2024-03-07 DIAGNOSIS — I480 Paroxysmal atrial fibrillation: Secondary | ICD-10-CM

## 2024-03-07 DIAGNOSIS — D6869 Other thrombophilia: Secondary | ICD-10-CM

## 2024-03-07 LAB — POCT INR: INR: 2.4 (ref 2.0–3.0)

## 2024-03-07 NOTE — Progress Notes (Signed)
 Primary Care Physician: Virgle Grime, MD Primary Cardiologist: Peter Swaziland, MD Electrophysiologist: Lei Pump, MD     Referring Physician: Dr. Swaziland    Laurie Horton is a 78 y.o. female with a history of HTN, aortic and coronary artery atherosclerosis by CT imaging, history of tobacco use, chronic systolic HF with recovered LVEF, CVA, PAD, s/p right retinal artery occlusion, HLD, oxygen dependent COPD, fibromyalgia, PFO, and paroxysmal atrial fibrillation/flutter who presents for consultation in the [facility]. Recent hospital admission 3/1-01/2024 for PAF with RVR; she converted to NSR spontaneously while admitted. Seen by Cardiology on 3/19 noted to be in atrial flutter with RVR. Coreg  transitioned to metoprolol  50 mg BID. Patient is on coumadin  for a CHADS2VASC score of 8.  On evaluation today, she is currently in NSR. Patient notes she has been in Afib about 5 times since hospital discharge. She notes to be nauseous on metoprolol  but does admit that the combination of diltiazem  and metoprolol  seem to help her somewhat with her HR. She feels dizzy when out of rhythm and will have to lie down to make sure she doesn't fall. She is on coumadin .  On follow up 03/07/24, she is currently in NSR. Seen by Dr. Lawana Pray on 4/22 and proceed with rate control strategy; Tikosyn as a last option although borderline Qtc. Caution with metoprolol  given COPD. Patient is upset about a double prescription of prednisone  she recently received from provider office; this was not caught she received two prescriptions only taking the one. She notes variability in her BP readings at home, particularly her systolic blood pressure reading.   Today, she denies symptoms of palpitations, chest pain, shortness of breath, orthopnea, PND, lower extremity edema, dizziness, presyncope, syncope, snoring, daytime somnolence, bleeding, or neurologic sequela. The patient is tolerating  medications without difficulties and is otherwise without complaint today.    she has a BMI of Body mass index is 33.02 kg/m.Laurie Horton Filed Weights   03/07/24 0837  Weight: 84.6 kg     Current Outpatient Medications  Medication Sig Dispense Refill   acetaminophen  (TYLENOL ) 500 MG tablet Take 1,000 mg by mouth 2 (two) times daily as needed for moderate pain (pain score 4-6) or headache.     desvenlafaxine  (PRISTIQ ) 25 MG 24 hr tablet Take 1 tablet (25 mg total) by mouth daily. (Patient taking differently: Take 25 mg by mouth daily at 4 PM.) 30 tablet 2   diltiazem  (CARDIZEM  CD) 240 MG 24 hr capsule Take 1 capsule (240 mg total) by mouth daily. 30 capsule 3   diltiazem  (CARDIZEM ) 30 MG tablet Take 1 tablet (30 mg total) by mouth every 8 (eight) hours as needed (Heart rate greater than 130). (Patient taking differently: Take 30 mg by mouth as needed (Heart rate greater than 130).) 90 tablet 2   Evolocumab  (REPATHA  SURECLICK) 140 MG/ML SOAJ Inject 140 mg into the skin every 14 (fourteen) days. 6 mL 1   furosemide  (LASIX ) 40 MG tablet TAKE 1 TABLET BY MOUTH EVERY DAY 90 tablet 2   gabapentin  (NEURONTIN ) 100 MG capsule Take 200 mg by mouth as needed (neuropathy).     HYDROcodone -acetaminophen  (NORCO/VICODIN) 5-325 MG tablet Take 1 tablet by mouth 2 (two) times daily.     ipratropium (ATROVENT ) 0.02 % nebulizer solution Take 2.5 mLs (0.5 mg total) by nebulization 4 (four) times daily. 300 mL 11   metoprolol  tartrate (LOPRESSOR ) 25 MG tablet Take 1 tablet (25 mg total) by mouth 2 (two) times  daily. 180 tablet 3   nitroGLYCERIN  (NITROSTAT ) 0.4 MG SL tablet PLACE 1 TABLET UNDER THE TONGUE EVERY 5 MINUTES AS NEEDED FOR CHEST PAIN. 25 tablet 7   OXYGEN Inhale 2 L/min into the lungs continuous.     pantoprazole  (PROTONIX ) 40 MG tablet TAKE 1 TABLET BY MOUTH EVERY DAY 30 tablet 5   polyethylene glycol (MIRALAX  / GLYCOLAX ) 17 g packet Take 17 g by mouth every other day.     spironolactone  (ALDACTONE ) 25 MG  tablet TAKE 1/2 TABLET BY MOUTH EVERY DAY 15 tablet 11   traZODone  (DESYREL ) 100 MG tablet Take one tab daily as needed for sleep (Patient taking differently: Take 100 mg by mouth at bedtime.) 30 tablet 2   warfarin (COUMADIN ) 5 MG tablet TAKE ONE-HALF TO 1 TABLET BY MOUTH DAILY OR AS DIRECTED BY CLINIC (Patient taking differently: Take 2.5-5 mg by mouth See admin instructions. Take 1/2 tablet (2.5mg ) by mouth at 1600 on all days EXCEPT take 1 tablet (5mg ) on Monday and Friday only.) 30 tablet 3   No current facility-administered medications for this encounter.    Atrial Fibrillation Management history:  Previous antiarrhythmic drugs: none Previous cardioversions: none Previous ablations: none Anticoagulation history: coumadin    ROS- All systems are reviewed and negative except as per the HPI above.  Physical Exam: BP (!) 144/84   Pulse 78   Ht 5\' 3"  (1.6 m)   Wt 84.6 kg   BMI 33.02 kg/m   GEN- The patient is well appearing, alert and oriented x 3 today.   Neck - no JVD or carotid bruit noted Lungs- Clear to ausculation bilaterally, normal work of breathing Heart- Regular rate and rhythm, no murmurs, rubs or gallops, PMI not laterally displaced Extremities- no clubbing, cyanosis, or edema Skin - no rash or ecchymosis noted   EKG today demonstrates  Vent. rate 78 BPM PR interval 176 ms QRS duration 106 ms QT/QTcB 390/444 ms P-R-T axes * 90 48 Sinus rhythm with Premature atrial complexes Rightward axis Nonspecific T wave abnormality Abnormal ECG When compared with ECG of 24-Jan-2024 08:48, Premature atrial complexes are now Present  Echo 01/02/24 demonstrated  1. Left ventricular ejection fraction, by estimation, is 65 to 70%. The  left ventricle has normal function. The left ventricle has no regional  wall motion abnormalities. There is mild left ventricular hypertrophy.  Left ventricular diastolic function  could not be evaluated.   2. Right ventricular systolic  function is normal. The right ventricular  size is normal.   3. Left atrial size was mildly dilated.   4. The mitral valve is abnormal. Trivial mitral valve regurgitation.   5. The aortic valve is tricuspid. Aortic valve regurgitation is not  visualized. Aortic valve sclerosis/calcification is present, without any  evidence of aortic stenosis.   6. Aortic dilatation noted. There is mild dilatation of the ascending  aorta, measuring 41 mm.   7. The inferior vena cava is normal in size with greater than 50%  respiratory variability, suggesting right atrial pressure of 3 mmHg.   Comparison(s): Changes from prior study are noted. 12/06/2021: LVEF 55-60%,  mild LAE.   ASSESSMENT & PLAN CHA2DS2-VASc Score = 8  The patient's score is based upon: CHF History: 1 HTN History: 1 Diabetes History: 0 Stroke History: 2 Vascular Disease History: 1 Age Score: 2 Gender Score: 1       ASSESSMENT AND PLAN: Paroxysmal Atrial Fibrillation (ICD10:  I48.0) The patient's CHA2DS2-VASc score is 8, indicating a 10.8% annual risk  of stroke.    She is currently in NSR. She wishes to remain with rate control strategy and does not want to do Tikosyn at this time. Continue diltiazem  240 mg daily. Continue Lopressor  25 mg BID. Continue blood pressure diary for Dr. Swaziland.    Secondary Hypercoagulable State (ICD10:  D68.69) The patient is at significant risk for stroke/thromboembolism based upon her CHA2DS2-VASc Score of 8.  Continue Warfarin (Coumadin ).  She is on coumadin .    Follow up 6 months Afib clinic.    Minnie Amber, PA-C  Afib Clinic [facility] [address] [place], [place] [address] [phone]

## 2024-03-07 NOTE — Patient Instructions (Signed)
 Continue taking 1/2 tablet daily except 1 tablet on Mondays and Fridays.  Recheck INR in 4 weeks.  Coumadin Clinic [phone]

## 2024-03-07 NOTE — Telephone Encounter (Signed)
 Patient presented to the office very upset due to a medication error and requested to speak with management.  I called patient back to an exam room to discuss this matter further. During our walk to back to the exam room, pt stated that our CMA's do not know what they are doing and they should all lose their license.  While speaking with the patient in the exam room, I researched her chart and it appears that during 02/21/2024 office visit, Dr. Bertrum Brodie sent in a prednisone  taper and zpak. Pt sent a mychart message on 02/25/2024 with some concerns of a possible reaction to zpak. At that time Dr. Bertrum Brodie recommended stopping the zpak and repeating the prednisone  taper. Dr. Bertrum Brodie sent in a Rx for prednisone  10mg  daily #20. It appears that Dr. Bertrum Brodie attempted to cancel this Rx and then sent in the correct prednisone  taper, however the Rx was not canceled on the pharmacy's end, therefore the pharmacy received both. When the patient went to the pharmacy on 4/25, she was giving the incorrect Rx. She was given 10mg  tablet daily. She started the Rx as instructed on the bottle. She took the medication for a couple of days before noticing that something was odd about the Rx. She then reached back out to our office on 4/27 for clarification. The message was routed to Dr. Bertrum Brodie for clarification and not addressed until 4/30. Pt did not respond to our mychart message until 5/1 and at that time she was provided with the correct instructions. Pt then used the Rx that she had on hand and took the prescribed taper.  She stated that she would be contacting AMA in regards to this matter and that Dr. Bertrum Brodie owes her a [employer] coffee and hunny bun.  She stated that her sx have improved, however she does still have a lingering productive cough with clear sputum and some chest congestion.  She denied SOB, f/c/s or additional sx.   Dr. Bertrum Brodie, please advise.

## 2024-03-08 NOTE — Telephone Encounter (Signed)
 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 - supported large [employer]bucks, carmel machhiasto , double shot - ok will try to get it for her at next visit  - we will aim to provider better custeomer service    SIGNATURE    Dr. Maire Scot, M.D., F.C.C.P,  Pulmonary and Critical Care Medicine Staff Physician, [facility] Director - Interstitial Lung Disease  Program  Pulmonary Fibrosis [facility] at [facility] [place], [place], [address]   Pager: [phone], If no answer  -> Check AMION or Try [phone] Telephone (clinical office): [phone] Telephone (research): [phone]  10:51 AM 03/08/2024

## 2024-03-09 NOTE — Telephone Encounter (Signed)
 Noted.

## 2024-03-11 NOTE — Progress Notes (Unsigned)
 Cardiology Office Note    Date:  03/15/2024   ID:  Laurie, Horton 21-Jul-1946, MRN [id number]  PCP:  Virgle Grime, MD  Cardiologist: Dr. [place]   Chief Complaint  Patient presents with   Atrial Fibrillation   Congestive Heart Failure     History of Present Illness:    Laurie Horton is a 78 y.o. female with past medical history of chronic diastolic CHF, PFO, PAF (on Coumadin ), COPD (on 2L [place] at baseline), HTN, and prior CVA who is seen for follow up CHF and PAD.  She was admitted from 4/9 - 02/12/2017 for worsening dyspnea on exertion and palpitations. Was in atrial fibrillation with RVR upon arrival to the ED. Echo during admission showed a newly reduced EF of 20-25% and she was diuresed with IV Lasix . Enzymes were negative and EKG showed no acute ischemic changes. It was recommended to consider a right/left heart cath in 4-6 weeks.   She did undergo right and left heart cath on 03/04/17. This showed severe 2 vessel obstructive CAD with 100% RCA occlusion, 75% OM1, and 90% small OM2. EF 25-30%. Mild pulmonary HTN with normal LV filling pressures. 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 Maximizing CHF therapy recommended.  She did have repeat Echo in September 2018 showing improvement in EF to 50-55%.   Subsequent to this she developed significant claudication. She was seen by Dr. Katheryne Pane and had angiography showing 80% infrarenal aortic stenosis and left iliac stenosis. She also had severe right common femoral stenosis. She was seen by Dr Charlotte Cookey for consideration of Aortobifemoral bypass. After pulmonary evaluation she was felt to be too high a risk for open surgery. In March 2019 she underwent atherectomy and covered stenting of the left iliac by Dr. Katheryne Pane. On follow up she did have improvement in her claudication and ABIs. The aortic and right common femoral artery stenoses are not felt to be amenable to percutaneous therapy.   In early July 2019 she was seen because  she felt she was in Afib following dental procedure. On arrival she was in NSR with PACs. No medical changes made.   She has been followed by Dr Katheryne Pane. She was noted to have a high grade left subclavian stenosis but it was unclear that this was symptomatic and given all her medical problems was felt best to manage medically. She had left shoulder injection by Dr Aviva Lemmings for adhesive capsulitis. She notes this has helped with her pain significantly. She is followed by pulmonary for COPD.   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 INR had been therapeutic. At time of infarct it was 1.9. We decided to add plavix  75 mg daily due to her extensive vascular disease. MRI showed no acute infarct but she did have evidence of multiple old strokes.   She had PNA in Dec/Jan. Followed by Dr Marcheta Seta for COPD exacerbation. Had CT done. Aortic size at 4.1 cm.    Patient presented to the hospital on 12/06/2021 with palpitations, chest pain, she was found to be in A-fib with RVR.  Serial troponin went from 9 --> 24 --> 36.  While in the ED, patient converted to sinus rhythm spontaneously. She was seen by the cardiology fellow who felt that she was not in CHF. Echocardiogram obtained on the same day showed EF 55 to 60%, no regional wall motion abnormality, mild LVH, mild LAE. She was discharged to follow-up with  cardiology service as outpatient.  CT of the head obtained on 12/08/2021 was negative for acute intracranial abnormality.  Heart monitor obtained in February showed several episodes of A-fib and SVT, however A-fib burden was fairly low at 2%.  She was admitted 01/01/2024 in the setting of atrial fibrillation with RVR.  Cardiology was consulted.  She was transitioned to oral diltiazem .  She spontaneously converted to normal sinus rhythm.  Echocardiogram showed EF 65 to 70%, no RWMA, mild LVH, normal RV, mild LAE, trivial MR, aortic sclerosis without  evidence of stenosis, mild dilation of the ascending aorta measuring 41 mm. When seen back in the office she was in Atrial flutter with RVR. Continued on diltiazem  and Coreg  switched to metoprolol . Seen in Afib clinic and by Dr Lawana Pray. Noted in and out of Afib. Diltiazem  dose increased. Only AAD option would be Tikosyn and QTc is mildly prolonged.   She reports that with this adjustment her Afib is doing better. She denies any increase in swelling. Weight  stable over the past 2 months. She did have a cranial MRI yesterday and had a contrast reaction with SOB, rash, and jittery. She notes some pain in her left posterior shoulder and jaw with vacuuming. Concerned about her BP which sometimes goes up to the 150 range. She has had some low BP readings as well. Legs are sore to touch. More limited by breathing rather than claudication. Had course of antibiotics and steroids for COPD exacerbation last month.  Past Medical History:  Diagnosis Date   Acute diastolic heart failure, NYHA class 1 (HCC) 02/08/2017   Acute heart failure (HCC) 02/08/2017   Cataract    OD   CHF (congestive heart failure) (HCC) 03/02/2017   EF 25-30% 2018   Complication of anesthesia    various issues with oxygen  saturations post op   COPD (chronic obstructive pulmonary disease) (HCC)    Depression    Depression    [employer]reesia 11/04/2020   Diverticulitis    DVT (deep venous thrombosis) (HCC)    Fibromyalgia    GERD (gastroesophageal reflux disease)    Heart murmur    [employer]reesia 11/04/2020   Hiatal hernia    History of deviated nasal septum    left- side   HTN (hypertension)    Hyperlipidemia    Hypertension    [employer]reesia 11/04/2020   Hypertensive retinopathy    OU   Myocardial infarction (HCC)    [employer]reesia 11/04/2020   Obesity    On supplemental oxygen therapy    concentrator at night @ 1.5 l/m or when sleeps. O2 Sat niormally 87.   Osteoporosis    Osteoporosis    Phreesia 11/04/2020   Oxygen deficiency    [employer]reesia  11/04/2020   PAT (paroxysmal atrial tachycardia) (HCC)    PFO (patent foramen ovale)    PULMONARY NODULE, LEFT LOWER LOBE 10/14/2009   5mm LLL nodule dec 2010. Stable and 4mm in Oct 2012. 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 Persists on cxr 07/22/11. Cleared on CT 08/24/11. No further followup   Stroke [facility])    TOBACCO ABUSE 06/04/2009    Past Surgical History:  Procedure Laterality Date   ABDOMINAL HYSTERECTOMY N/A    [employer]reesia 11/04/2020   CARDIOVASCULAR STRESS TEST  12/26/2004   EF 74%. NO EVIDENCE OF ISCHEMIA   CATARACT EXTRACTION Left    Dr. Meridee Standing  ESOPHAGOGASTRODUODENOSCOPY (EGD) WITH PROPOFOL  N/A 04/15/2015   Procedure: ESOPHAGOGASTRODUODENOSCOPY (EGD) WITH PROPOFOL ;  Surgeon: Jolinda Necessary, MD;  Location: [facility] ENDOSCOPY;  Service: Endoscopy;  Laterality: N/A;   EYE SURGERY Left    Cat Sx   JOINT REPLACEMENT N/A    [employer]reesia 11/04/2020   KNEE ARTHROSCOPY  2000   left   LAPAROSCOPIC CHOLECYSTECTOMY  04-16-2010   [facility]   LOWER EXTREMITY ANGIOGRAPHY N/A 09/09/2017   Procedure: Lower Extremity Angiography;  Surgeon: Avanell Leigh, MD;  Location: [facility] INVASIVE CV LAB;  Service: Cardiovascular;  Laterality: N/A;   LOWER EXTREMITY INTERVENTION Left 01/17/2018   Procedure: LOWER EXTREMITY INTERVENTION;  Surgeon: Avanell Leigh, MD;  Location: [facility] INVASIVE CV LAB;  Service: Cardiovascular;  Laterality: Left;   MOUTH SURGERY     03-26-15 multiple extractions stitches remains   PERIPHERAL VASCULAR INTERVENTION Left 01/17/2018   Procedure: PERIPHERAL VASCULAR INTERVENTION;  Surgeon: Avanell Leigh, MD;  Location: [facility] INVASIVE CV LAB;  Service: Cardiovascular;  Laterality: Left;  COMMON ILIAC   RIGHT/LEFT HEART CATH AND CORONARY ANGIOGRAPHY N/A 03/04/2017   Procedure: Right/Left Heart Cath and Coronary Angiography;  Surgeon: Avantika Shere M Swaziland, MD;  Location: [facility] INVASIVE CV  LAB;  Service: Cardiovascular;  Laterality: N/A;   TOTAL ABDOMINAL HYSTERECTOMY     post op needed oxygen was told "she gave them a scare"   TUBAL LIGATION     US  ECHOCARDIOGRAPHY  11/20/2009   EF 55-60%    Current Medications: Outpatient Medications Prior to Visit  Medication Sig Dispense Refill   acetaminophen  (TYLENOL ) 500 MG tablet Take 1,000 mg by mouth 2 (two) times daily as needed for moderate pain (pain score 4-6) or headache.     desvenlafaxine  (PRISTIQ ) 25 MG 24 hr tablet Take 1 tablet (25 mg total) by mouth daily. (Patient taking differently: Take 25 mg by mouth daily at 4 PM.) 30 tablet 2   diltiazem  (CARDIZEM  CD) 240 MG 24 hr capsule Take 1 capsule (240 mg total) by mouth daily. 30 capsule 3   diltiazem  (CARDIZEM ) 30 MG tablet Take 1 tablet (30 mg total) by mouth every 8 (eight) hours as needed (Heart rate greater than 130). (Patient taking differently: Take 30 mg by mouth as needed (Heart rate greater than 130).) 90 tablet 2   Evolocumab  (REPATHA  SURECLICK) 140 MG/ML SOAJ Inject 140 mg into the skin every 14 (fourteen) days. 6 mL 1   furosemide  (LASIX ) 40 MG tablet TAKE 1 TABLET BY MOUTH EVERY DAY 90 tablet 2   gabapentin  (NEURONTIN ) 100 MG capsule Take 200 mg by mouth as needed (neuropathy).     HYDROcodone -acetaminophen  (NORCO/VICODIN) 5-325 MG tablet Take 1 tablet by mouth 2 (two) times daily.     ipratropium (ATROVENT ) 0.02 % nebulizer solution Take 2.5 mLs (0.5 mg total) by nebulization 4 (four) times daily. 300 mL 11   metoprolol  tartrate (LOPRESSOR ) 25 MG tablet Take 1 tablet (25 mg total) by mouth 2 (two) times daily. 180 tablet 3   nitroGLYCERIN  (NITROSTAT ) 0.4 MG SL tablet PLACE 1 TABLET UNDER THE TONGUE EVERY 5 MINUTES AS NEEDED FOR CHEST PAIN. 25 tablet 7   OXYGEN Inhale 2 L/min into the lungs continuous.     pantoprazole  (PROTONIX ) 40 MG tablet TAKE 1 TABLET BY MOUTH EVERY DAY 30 tablet 5   polyethylene glycol (MIRALAX  / GLYCOLAX ) 17 g packet Take 17 g by mouth every  other day.     spironolactone  (ALDACTONE ) 25 MG tablet TAKE 1/2 TABLET BY MOUTH EVERY DAY 15  tablet 11   traZODone  (DESYREL ) 100 MG tablet Take one tab daily as needed for sleep (Patient taking differently: Take 100 mg by mouth at bedtime.) 30 tablet 2   warfarin (COUMADIN ) 5 MG tablet TAKE ONE-HALF TO 1 TABLET BY MOUTH DAILY OR AS DIRECTED BY CLINIC (Patient taking differently: Take 2.5-5 mg by mouth See admin instructions. Take 1/2 tablet (2.5mg ) by mouth at 1600 on all days EXCEPT take 1 tablet (5mg ) on Monday and Friday only.) 30 tablet 3   No facility-administered medications prior to visit.     Allergies:   Bee venom, Iodine , Sudafed [pseudoephedrine], Tessalon [benzonatate], Vueway [gadopiclenol], Xanax [alprazolam], Azithromycin , Budesonide -formoterol  fumarate, Crestor  [rosuvastatin  calcium ], Flonase [fluticasone propionate], Lamictal  [lamotrigine ], Lotrimin [clotrimazole], Lunesta [eszopiclone], Oxcarbazepine, Statins, Ambien [zolpidem tartrate], Betadine [povidone iodine ], Bevespi  aerosphere [glycopyrrolate -formoterol ], Biaxin  [clarithromycin ], Breztri  aerosphere [budeson-glycopyrrol-formoterol ], Claritin-d [loratadine-pseudoephedrine er], Effexor  [venlafaxine ], Lexapro  [escitalopram  oxalate], Nexium [esomeprazole magnesium ], Aciphex [rabeprazole sodium], Avelox [moxifloxacin hcl in nacl], Bentyl [dicyclomine], Bextra [valdecoxib], Ceclor [cefaclor], Covera-hs  [verapamil  hcl], Dicyclomine hcl, Estrace [estradiol], Fosamax  [alendronate  sodium], Keflex [cephalexin], and Other   Social History   Socioeconomic History   Marital status: Divorced    Spouse name: Not on file   Number of children: 2   Years of education: Not on file   Highest education level: Not on file  Occupational History   Occupation: disability  Tobacco Use   Smoking status: Former    Current packs/day: 0.00    Average packs/day: 3.0 packs/day for 30.0 years (90.0 ttl pk-yrs)    Types: Cigarettes    Start date:  06/02/1980    Quit date: 06/02/2010    Years since quitting: 13.7   Smokeless tobacco: Never   Tobacco comments:    QUIT IN 2011  Vaping Use   Vaping status: Never Used  Substance and Sexual Activity   Alcohol use: No    Alcohol/week: 0.0 standard drinks of alcohol   Drug use: No   Sexual activity: Yes    Partners: Male    Birth control/protection: Post-menopausal, Surgical  Other Topics Concern   Not on file  Social History Narrative   Marital status: divorced in 1978 after ten years; dating casually in 2019.      Children: 3 biological children; 3 court appointed children; 6 grandchildren; 5 gg      Lives: alone; children in [place] and [place].      Employment: retired age 35; disability for COPD, CVA at age 43.      Tobacco: former smoker; quit smoking 2011.      Alcohol: none      Exercise: walks dog four times per day; goes to pool three times per week.      ADLs: drives; no assistant devices; does have a walker.  Cleaning is limited in 2018.  Daughter helps with cleaning.  Does own grocery shopping.      Advanced Directives: YES; DNR/DNI; HCPOA: Monda Angry Martin/daughter youngest.  Blind.               Social Drivers of Corporate investment banker Strain: Not on file  Food Insecurity: No Food Insecurity (01/02/2024)   Hunger Vital Sign    Worried About Running Out of Food in the Last Year: Never true    Ran Out of Food in the Last Year: Never true  Transportation Needs: No Transportation Needs (01/01/2024)   PRAPARE - Administrator, Civil Service (Medical): No    Lack of Transportation (Non-Medical): No  Physical Activity:  Not on file  Stress: Not on file  Social Connections: Unknown (01/01/2024)   Social Connection and Isolation Panel [NHANES]    Frequency of Communication with Friends and Family: More than three times a week    Frequency of Social Gatherings with Friends and Family: Patient declined    Attends Religious Services: Patient declined     Database administrator or Organizations: Patient declined    Attends Engineer, structural: Patient declined    Marital Status: Patient declined     Family History:  The patient's family history includes Alzheimer's disease in her mother; Dementia in her mother; Diabetes in her mother and sister; Heart attack in her father; Heart attack (age of onset: 59) in her brother; Schizophrenia in her sister; Tremor in her sister.   Review of Systems:   As noted in HPI.  All other systems reviewed and are otherwise negative except as noted above.   Physical Exam:    VS:  BP 112/62 (Cuff Size: Large)   Pulse 65   Ht 5\' 3"  (1.6 m)   Wt 184 lb (83.5 kg)   SpO2 (!) 86%   BMI 32.59 kg/m    GENERAL:  Well appearing overweight WF in NAD HEENT:  PERRL, EOMI, sclera are clear. Oropharynx is clear. NECK:  No jugular venous distention, carotid upstroke brisk and symmetric, right carotid bruit,  left subclavian  bruit, no thyromegaly or adenopathy LUNGS:  Clear to auscultation bilaterally CHEST:  + scoliosis.  HEART:  RRR,  PMI not displaced or sustained,S1 and S2 within normal limits, no S3, no S4: no clicks, no rubs, no murmurs.  ABD:  Soft, nontender. BS +, no masses or bruits. No hepatomegaly, no splenomegaly EXT:  Poor pedal pulses, absent left radial pulse.  no edema, no cyanosis no clubbing SKIN:  Warm and dry.  No rashes NEURO:  Alert and oriented x 3. Cranial nerves II through XII intact. PSYCH:  Cognitively intact    Wt Readings from Last 3 Encounters:  03/15/24 184 lb (83.5 kg)  03/07/24 186 lb 6.4 oz (84.6 kg)  02/22/24 183 lb (83 kg)     Studies/Labs Reviewed:   Recent Labs: 01/01/2024: B Natriuretic Peptide 219.3; Magnesium  1.8; TSH 1.394 01/02/2024: Hemoglobin 12.6; Platelets 163 01/03/2024: BUN 14; Creatinine, Ser 0.96; Potassium 3.8; Sodium 137   Lipid Panel    Component Value Date/Time   CHOL 124 01/17/2024 0839   TRIG 150 (H) 01/17/2024 0839   HDL 44 01/17/2024  0839   CHOLHDL 2.8 01/17/2024 0839   CHOLHDL 3 07/02/2021 1017   VLDL 44.0 (H) 07/02/2021 1017   LDLCALC 54 01/17/2024 0839   LDLDIRECT 67.0 07/02/2021 1017  Dated 11/20/21: cholesterol 142, HDL 42,  Dated 03/23/22:  A1c 6.3%. triglycerides122   Additional studies/ records that were reviewed today include:   Echocardiogram: 02/09/2017 Study Conclusions   - Left ventricle: The cavity size was mildly dilated. Wall   thickness was increased in a pattern of mild LVH. Systolic   function was severely reduced. The estimated ejection fraction   was in the range of 20% to 25%. Diffuse hypokinesis. Doppler   parameters are consistent with abnormal left ventricular   relaxation (grade 1 diastolic dysfunction). - Aortic valve: Valve area (Vmax): 1.4 cm^2. - Aortic root: The aortic root was mildly dilated. - Ascending aorta: The ascending aorta was mildly dilated. - Mitral valve: Calcified annulus. There was moderate   regurgitation. - Left atrium: The atrium was moderately dilated. -  Right ventricle: Systolic function was moderately reduced. - Pulmonary arteries: Systolic pressure was mildly increased. - Pericardium, extracardiac: A trivial pericardial effusion was   identified.   Impressions:   - No subcostal views; severe global reduction in LV systolic   function; grade 1 diastolic dysfunction; mildly dilated aortic   root and ascending aorta; moderate MR; moderaet LAE; moderately   reduced RV function; mild TR; mildly elevated pulmonary pressure.  Procedures   Right/Left Heart Cath and Coronary Angiography  Conclusion     Ost 1st Mrg to 1st Mrg lesion, 75 %stenosed. 2nd Mrg lesion, 90 %stenosed. Ost RCA to Mid RCA lesion, 100 %stenosed. There is severe left ventricular systolic dysfunction. LV end diastolic pressure is normal. The left ventricular ejection fraction is 25-35% by visual estimate. Hemodynamic findings consistent with mild pulmonary hypertension. LV end  diastolic pressure is normal.   1. Severe 2 vessel obstructive CAD    - 75% proximal OM1    - 90% small OM2    - 100% proximal RCA. Left to right collaterals.  2. Severe LV dysfunction 3. Mild pulmonary HTN with normal LV filling pressures.  4. Cardiac index 2.41 L/min/BSA     Plan: Medical management to try and optimize CHF therapy. Patient appears to be adequately diuresed at this time. Based on these results her cardiomyopathy is ischemic. I would treat her CAD medically. If cardiac cath is needed in the future would consider alternative access given difficulty from the right radial approach. Her rhythm during procedure is a multifocal atrial rhythm/tachycardia.      Echo 07/08/17: Study Conclusions   - Left ventricle: The cavity size was normal. There was moderate   concentric hypertrophy. Systolic function was normal. The   estimated ejection fraction was in the range of 50% to 55%.   Severe hypokinesis of the basal-midinferior myocardium;   consistent with infarction in the distribution of the right   coronary artery. Doppler parameters are consistent with abnormal   left ventricular relaxation (grade 1 diastolic dysfunction). - Mitral valve: Calcified annulus.   Impressions:   - Compared to April 2018 there is marked improvement in contraction   of all LV wall segemnts except for the inferior wall, which   remains severely hypokinetic. There is also marked reduction in   the severity of mitral insufficiency.    Echo 12/06/2021  1. Left ventricular ejection fraction, by estimation, is 55 to 60%. The  left ventricle has normal function. The left ventricle has no regional  wall motion abnormalities. There is mild left ventricular hypertrophy.  Left ventricular diastolic parameters  were normal.   2. Right ventricular systolic function is normal. The right ventricular  size is normal.   3. Left atrial size was mildly dilated.   4. The mitral valve is normal in structure. No  evidence of mitral valve  regurgitation. No evidence of mitral stenosis.   5. The aortic valve is tricuspid. There is mild calcification of the  aortic valve. There is mild thickening of the aortic valve. Aortic valve  regurgitation is not visualized. Aortic valve sclerosis is present, with  no evidence of aortic valve stenosis.   6. The inferior vena cava is normal in size with greater than 50%  respiratory variability, suggesting right atrial pressure of 3 mmHg.     Monitor 01/02/22: Study Highlights    Normal sinus rhythm Atrial fibrillation with RVR on 12/19/21 and 12/21/21 with longest episode lasting 5 hours and 40 minutes and average HR 120  bpm. AFib burden 2% Frequent PACs with few short bursts of SVT longest lasting 15 beats Occasional PVCs with rare NSVT longest lasting 5 beats. Symptoms appear to correlate with Afib an SVT.     Patch Wear Time:  13 days and 23 hours (2023-02-10T10:12:14-0500 to 2023-02-24T09:21:34-0500)   Patient had a min HR of 43 bpm, max HR of 235 bpm, and avg HR of 71 bpm. Predominant underlying rhythm was Sinus Rhythm. First Degree AV Block was present. 2 Ventricular Tachycardia runs occurred, the run with the fastest interval lasting 5 beats with a  max rate of 235 bpm (avg 156 bpm); the run with the fastest interval was also the longest. 7 Supraventricular Tachycardia runs occurred, the run with the fastest interval lasting 4 beats with a max rate of 176 bpm, the longest lasting 15 beats with an  avg rate of 128 bpm. Some episodes of Supraventricular Tachycardia may be possible Atrial Tachycardia with variable block. Atrial Fibrillation occurred (2% burden), ranging from 75-167 bpm (avg of 120 bpm), the longest lasting 5 hours 40 mins with an avg  rate of 116 bpm. Supraventricular Tachycardia and Atrial Fibrillation were detected within +/- 45 seconds of symptomatic patient event(s). Isolated SVEs were frequent (11.5%, [id number]), SVE Couplets were frequent (5.5%,  [id number]), and SVE Triplets were  occasional (1.7%, 8129). Isolated VEs were occasional (4.5%, [id number]), VE Couplets were rare (<1.0%, 596), and VE Triplets were rare (<1.0%, 41). Ventricular Bigeminy and Trigeminy were present.   Echo 01/02/24: IMPRESSIONS     1. Left ventricular ejection fraction, by estimation, is 65 to 70%. The  left ventricle has normal function. The left ventricle has no regional  wall motion abnormalities. There is mild left ventricular hypertrophy.  Left ventricular diastolic function  could not be evaluated.   2. Right ventricular systolic function is normal. The right ventricular  size is normal.   3. Left atrial size was mildly dilated.   4. The mitral valve is abnormal. Trivial mitral valve regurgitation.   5. The aortic valve is tricuspid. Aortic valve regurgitation is not  visualized. Aortic valve sclerosis/calcification is present, without any  evidence of aortic stenosis.   6. Aortic dilatation noted. There is mild dilatation of the ascending  aorta, measuring 41 mm.   7. The inferior vena cava is normal in size with greater than 50%  respiratory variability, suggesting right atrial pressure of 3 mmHg.   Comparison(s): Changes from prior study are noted. 12/06/2021: LVEF 55-60%,  mild LAE.   Assessment:    1. Paroxysmal atrial fibrillation (HCC)   2. Coronary artery disease involving native coronary artery of native heart without angina pectoris   3. Chronic systolic (congestive) heart failure (HCC)   4. Secondary hypercoagulable state (HCC)   5. Essential hypertension           Plan:    1. Chronic Combined Systolic and Diastolic CHF/  Secondary to ischemic/tachycardia mediated Cardiomyopathy - EF 25-30% in 2018 with ischemic cardiomyopathy. Not a candidate for revascularization. With medical management EF improved to 50-55% by Echo. Last Echo in March with EF 65-70% - she is euvolemic at this time. Weight is stable.   - continue BB, aldactone ,  lasix  40 mg daily and statin. ARB discontinued due to orthostatic dizziness.   2. Paroxysmal Atrial Fibrillation/ multifocal atrial tachycardia - This patients CHA2DS2-VASc Score and unadjusted Ischemic Stroke Rate (% per year) is equal to 9.7 % stroke rate/year from a score of 6 (CHF, HTN, Female, Age, CVA (  2)). She denies any evidence of active bleeding. Continue Coumadin  for anticoagulation. On metoprolol  and diltiazem  for rate control. Improved for now. Consider Tikosyn if significant increase in Afib.   3. HTN- controlled. I am overall happy with her BP control. I am actually concerned about overdoing it and her getting hypotensive. Will continue to monitor for now.   4. COPD/Emphysema- - followed by Pulmonology. On oxygen  5. PFO - no plans for closure   6. PAD s/p covered stenting of left iliac. Stenosis in distal aorta and right femoral not amenable to percutaneous therapy. Left subclavian stenosis. Claudication is stable.   7. CAD. 100% RCA. 75% OM. Chronic stable angina class 1-2.  No active angina. Rarely uses Ntg. A lot of her pain is musculoskeletal on exam  8. S/p right retinal artery occlusion. Plavix  added to coumadin  due to severe vascular disease. I feel benefit of Plavix /coumadin  combination outweighs risk of bleeding at this time.  9. 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 Repeat done yesterday. Results pending  10. Hypercholesterolemia. Now on Repatha . Excellent result with LDL 52  11. Carotid arterial disease. No new Neurologic symptoms. Dopplers in January 2023 were unchanged.   12. 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 Reassured her about this. She is not a candidate for aortic surgery  13. Musculoskeletal rib pain.   I will follow up in 3 months   Signed, Terralyn Matsumura [place], MD  03/15/2024 8:47 AM    [facility] [address], [address] [place], [place] [address] Phone: [phone]

## 2024-03-14 ENCOUNTER — Ambulatory Visit
Admission: RE | Admit: 2024-03-14 | Discharge: 2024-03-14 | Disposition: A | Discharge status: Home / Self Care | Source: Ambulatory Visit | Attending: Neurology | Admitting: Neurology

## 2024-03-14 DIAGNOSIS — G3184 Mild cognitive impairment, so stated: Secondary | ICD-10-CM | POA: Diagnosis not present

## 2024-03-14 DIAGNOSIS — G441 Vascular headache, not elsewhere classified: Secondary | ICD-10-CM | POA: Diagnosis not present

## 2024-03-14 MED ORDER — DIPHENHYDRAMINE HCL 50 MG/ML IJ SOLN
50.0000 mg | Freq: Once | INTRAMUSCULAR | Status: AC
  Administered 2024-03-14: 50 mg via INTRAVENOUS

## 2024-03-14 MED ORDER — ALBUTEROL SULFATE (2.5 MG/3ML) 0.083% IN NEBU
2.5000 mg | INHALATION_SOLUTION | Freq: Once | RESPIRATORY_TRACT | Status: AC
  Administered 2024-03-14: 2.5 mg via RESPIRATORY_TRACT

## 2024-03-14 MED ORDER — GADOPICLENOL 0.5 MMOL/ML IV SOLN
9.0000 mL | Freq: Once | INTRAVENOUS | Status: AC | PRN
  Administered 2024-03-14: 9 mL via INTRAVENOUS

## 2024-03-14 NOTE — Progress Notes (Signed)
 Patient ID: Laurie Horton, female   DOB: 1946-05-30, 78 y.o.   MRN: [id number]  Called to see patient at nurse's station at [facility] for a suspected contrast reaction following a brain MRI (with IV Vueway). Patient developed SOB, throat and chest tightness, anxiety, and a rash. Benadryl  50 mg IV, an albuterol  nebulizer, and supplemental oxygen were administered and the symptoms gradually resolved. Following medication administration, O2 sat was 90-94% by pulse oximetry (on 2L [place] which the patient uses at home). Given extensive allergy history and comorbidities including COPD, it is recommended that future radiology examinations utilizing IV contrast be performed in the hospital setting.

## 2024-03-15 ENCOUNTER — Ambulatory Visit: Payer: Medicare Other | Attending: Cardiology | Admitting: Cardiology

## 2024-03-15 ENCOUNTER — Encounter: Payer: Self-pay | Admitting: Cardiology

## 2024-03-15 ENCOUNTER — Encounter

## 2024-03-15 VITALS — BP 112/62 | HR 65 | Ht 63.0 in | Wt 184.0 lb

## 2024-03-15 DIAGNOSIS — I480 Paroxysmal atrial fibrillation: Secondary | ICD-10-CM

## 2024-03-15 DIAGNOSIS — I2510 Atherosclerotic heart disease of native coronary artery without angina pectoris: Secondary | ICD-10-CM | POA: Diagnosis not present

## 2024-03-15 DIAGNOSIS — D6869 Other thrombophilia: Secondary | ICD-10-CM | POA: Diagnosis not present

## 2024-03-15 DIAGNOSIS — I10 Essential (primary) hypertension: Secondary | ICD-10-CM

## 2024-03-15 DIAGNOSIS — I5022 Chronic systolic (congestive) heart failure: Secondary | ICD-10-CM

## 2024-03-15 NOTE — Patient Instructions (Addendum)
 Medication Instructions:  Continue same medications  *If you need a refill on your cardiac medications before your next appointment, please call your pharmacy*  Lab Work: None ordered  Testing/Procedures: None ordered  Follow-Up: At [facility], you and your health needs are our priority.  As part of our continuing mission to provide you with exceptional heart care, our providers are all part of one team.  This team includes your primary Cardiologist (physician) and Advanced Practice Providers or APPs (Physician Assistants and Nurse Practitioners) who all work together to provide you with the care you need, when you need it.  Your next appointment:  3 months   Thursday  8/7 at 8:40 am    Provider:  Dr.Jordan   We recommend signing up for the patient portal called "MyChart".  Sign up information is provided on this After Visit Summary.  MyChart is used to connect with patients for Virtual Visits (Telemedicine).  Patients are able to view lab/test results, encounter notes, upcoming appointments, etc.  Non-urgent messages can be sent to your provider as well.   To learn more about what you can do with MyChart, go to [email or web address].

## 2024-03-18 ENCOUNTER — Ambulatory Visit: Payer: Self-pay | Admitting: Neurology

## 2024-03-18 NOTE — Progress Notes (Signed)
 Kindly inform the patient that MRI scan of the brain shows evidence of old strokes on the left side towards the back and side of the brain.  There are so age-related changes of hardening of the arteries.  No new or worrisome findings.

## 2024-03-26 ENCOUNTER — Other Ambulatory Visit: Payer: Self-pay | Admitting: Cardiology

## 2024-03-26 DIAGNOSIS — I480 Paroxysmal atrial fibrillation: Secondary | ICD-10-CM

## 2024-03-28 NOTE — Telephone Encounter (Signed)
 Refill request for warfarin:  Last INR was 2.4 on 03/07/24 Next INR due 04/04/24 LOV was 03/15/24  Refill approved.

## 2024-03-30 ENCOUNTER — Encounter (HOSPITAL_COMMUNITY): Payer: Self-pay | Admitting: Psychiatry

## 2024-03-30 ENCOUNTER — Ambulatory Visit (HOSPITAL_BASED_OUTPATIENT_CLINIC_OR_DEPARTMENT_OTHER): Payer: Medicare Other | Admitting: Psychiatry

## 2024-03-30 VITALS — BP 132/78 | HR 68 | Ht 64.0 in | Wt 191.0 lb

## 2024-03-30 DIAGNOSIS — F5105 Insomnia due to other mental disorder: Secondary | ICD-10-CM

## 2024-03-30 DIAGNOSIS — F411 Generalized anxiety disorder: Secondary | ICD-10-CM | POA: Diagnosis not present

## 2024-03-30 DIAGNOSIS — F331 Major depressive disorder, recurrent, moderate: Secondary | ICD-10-CM

## 2024-03-30 DIAGNOSIS — F99 Mental disorder, not otherwise specified: Secondary | ICD-10-CM

## 2024-03-30 MED ORDER — TRAZODONE HCL 100 MG PO TABS
ORAL_TABLET | ORAL | 2 refills | Status: DC
Start: 2024-03-30 — End: 2024-07-06

## 2024-03-30 MED ORDER — DESVENLAFAXINE SUCCINATE ER 25 MG PO TB24: 25.0000 mg | ORAL_TABLET | Freq: Every day | ORAL | 2 refills | Status: DC

## 2024-03-30 NOTE — Progress Notes (Signed)
 BH MD/PA/NP OP Progress Note  Patient Location: Advertising account planner Location: Office  03/30/2024 8:29 AM Laurie Horton  MRN:  [id number]  Chief Complaint:  Chief Complaint  Patient presents with   Follow-up   Anxiety   Medication Refill   HPI: Patient came today for her follow-up appointment in the office.  She reported a lot of anxiety and family issues.  Recently oldest daughter involved in a motor vehicle accident and she was informed by her grandson.  Patient told daughter has no major injuries but she is not without car.  She also reported daughter received eviction notice because she is behind utility bills and rent.  Patient reported financial issues and not happy with the insurance as she received a bill from the hospital stay in March and she believed it is not accurate.  She admitted sometime crying but denies any aggression, violence, suicidal thoughts.  She now hire a person who does cleaning as patient has difficulty cleaning the house.  She admitted a lot of concerned about her general health and declining functionality.  She is taking too many medication and sometimes difficult to keep up.  She does drive short distance.  She had a good support from her neighbors.  Her youngest daughter talk to her on a regular basis.  She denies any hopelessness or worthlessness.  She denies any irritability mania, psychosis.  She denies any major panic attack.  She sleeps on and off and good nights she sleeps 6 to 7 hours but usually 4 to 5 hours.  She takes trazodone .  She also prescribed gabapentin  to help her numbness and tingling and usually she takes when she is a lot of pain.  She told her whenever she received the phone call from her daughter she get upset and then it takes time to calm her down.  She denies drinking or using any illegal substances.  She denies any dizziness, fall.  Her appetite is okay.  Her weight is stable.  Visit Diagnosis:    ICD-10-CM   1. MDD (major depressive  disorder), recurrent episode, moderate (HCC)  F33.1 desvenlafaxine  (PRISTIQ ) 25 MG 24 hr tablet    traZODone  (DESYREL ) 100 MG tablet    2. Generalized anxiety disorder  F41.1 desvenlafaxine  (PRISTIQ ) 25 MG 24 hr tablet    traZODone  (DESYREL ) 100 MG tablet    3. Insomnia due to other mental disorder  F51.05 traZODone  (DESYREL ) 100 MG tablet   F99        Past Psychiatric History: Reviewed  H/O depression and anxiety. No H/O inpatient treatment, suicidal attempt, paranoia, mania and hallucination. Seeing psychiatrist in 2006 after a stroke.  Tried Cymbalta, lexapro  and Celexa with limited response.  Tried Ambien, Lunesta, Lamictal  and Xanax but developed allergies and side effects.  Tried higher Pristiq  but has side effects. Valium  helped in past.   Past Medical History:  Past Medical History:  Diagnosis Date   Acute diastolic heart failure, NYHA class 1 (HCC) 02/08/2017   Acute heart failure (HCC) 02/08/2017   Cataract    OD   CHF (congestive heart failure) (HCC) 03/02/2017   EF 25-30% 2018   Complication of anesthesia    various issues with oxygen  saturations post op   COPD (chronic obstructive pulmonary disease) (HCC)    Depression    Depression    Phreesia 11/04/2020   Diverticulitis    DVT (deep venous thrombosis) (HCC)    Fibromyalgia    GERD (gastroesophageal reflux disease)  Heart murmur    Phreesia 11/04/2020   Hiatal hernia    History of deviated nasal septum    left- side   HTN (hypertension)    Hyperlipidemia    Hypertension    Phreesia 11/04/2020   Hypertensive retinopathy    OU   Myocardial infarction [facility])    Phreesia 11/04/2020   Obesity    On supplemental oxygen therapy    concentrator at night @ 1.5 l/m or when sleeps. O2 Sat niormally 87.   Osteoporosis    Osteoporosis    Phreesia 11/04/2020   Oxygen deficiency    Phreesia 11/04/2020   PAT (paroxysmal atrial tachycardia) (HCC)    PFO (patent foramen ovale)    PULMONARY NODULE, LEFT LOWER LOBE  10/14/2009   5mm LLL nodule dec 2010. Stable and 4mm in Oct 2012. 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 Persists on cxr 07/22/11. Cleared on CT 08/24/11. No further followup   Stroke [facility])    TOBACCO ABUSE 06/04/2009    Past Surgical History:  Procedure Laterality Date   ABDOMINAL HYSTERECTOMY N/A    Phreesia 11/04/2020   CARDIOVASCULAR STRESS TEST  12/26/2004   EF 74%. NO EVIDENCE OF ISCHEMIA   CATARACT EXTRACTION Left    Dr. Meridee Standing   ESOPHAGOGASTRODUODENOSCOPY (EGD) WITH PROPOFOL  N/A 04/15/2015   Procedure: ESOPHAGOGASTRODUODENOSCOPY (EGD) WITH PROPOFOL ;  Surgeon: Jolinda Necessary, MD;  Location: [facility] ENDOSCOPY;  Service: Endoscopy;  Laterality: N/A;   EYE SURGERY Left    Cat Sx   JOINT REPLACEMENT N/A    Phreesia 11/04/2020   KNEE ARTHROSCOPY  2000   left   LAPAROSCOPIC CHOLECYSTECTOMY  04-16-2010   [facility]   LOWER EXTREMITY ANGIOGRAPHY N/A 09/09/2017   Procedure: Lower Extremity Angiography;  Surgeon: Avanell Leigh, MD;  Location[facility] INVASIVE CV LAB;  Service: Cardiovascular;  Laterality: N/A;   LOWER EXTREMITY INTERVENTION Left 01/17/2018   Procedure: LOWER EXTREMITY INTERVENTION;  Surgeon: Avanell Leigh, MD;  Location: [facility] INVASIVE CV LAB;  Service: Cardiovascular;  Laterality: Left;   MOUTH SURGERY     03-26-15 multiple extractions stitches remains   PERIPHERAL VASCULAR INTERVENTION Left 01/17/2018   Procedure: PERIPHERAL VASCULAR INTERVENTION;  Surgeon: Avanell Leigh, MD;  Location: [facility] INVASIVE CV LAB;  Service: Cardiovascular;  Laterality: Left;  COMMON ILIAC   RIGHT/LEFT HEART CATH AND CORONARY ANGIOGRAPHY N/A 03/04/2017   Procedure: Right/Left Heart Cath and Coronary Angiography;  Surgeon: Peter M [place], MD;  Location: [facility] INVASIVE CV LAB;  Service: Cardiovascular;  Laterality: N/A;   TOTAL ABDOMINAL HYSTERECTOMY     post op needed oxygen was told "she gave  them a scare"   TUBAL LIGATION     US  ECHOCARDIOGRAPHY  11/20/2009   EF 55-60%    Family Psychiatric History: Reviewed  Family History:  Family History  Problem Relation Age of Onset   Dementia Mother    Diabetes Mother    Alzheimer's disease Mother    Heart attack Brother 64   Heart attack Father    Schizophrenia Sister    Diabetes Sister    Tremor Sister     Social History:  Social History   Socioeconomic History   Marital status: Divorced    Spouse name: Not on file   Number of children: 2   Years of education: Not on file   Highest education level: Not on file  Occupational History  Occupation: disability  Tobacco Use   Smoking status: Former    Current packs/day: 0.00    Average packs/day: 3.0 packs/day for 30.0 years (90.0 ttl pk-yrs)    Types: Cigarettes    Start date: 06/02/1980    Quit date: 06/02/2010    Years since quitting: 13.8   Smokeless tobacco: Never   Tobacco comments:    QUIT IN 2011  Vaping Use   Vaping status: Never Used  Substance and Sexual Activity   Alcohol use: No    Alcohol/week: 0.0 standard drinks of alcohol   Drug use: No   Sexual activity: Yes    Partners: Male    Birth control/protection: Post-menopausal, Surgical  Other Topics Concern   Not on file  Social History Narrative   Marital status: divorced in 1978 after ten years; dating casually in 2019.      Children: 3 biological children; 3 court appointed children; 6 grandchildren; 5 gg      Lives: alone; children in [place] and [place].      Employment: retired age 89; disability for COPD, CVA at age 44.      Tobacco: former smoker; quit smoking 2011.      Alcohol: none      Exercise: walks dog four times per day; goes to pool three times per week.      ADLs: drives; no assistant devices; does have a walker.  Cleaning is limited in 2018.  Daughter helps with cleaning.  Does own grocery shopping.      Advanced Directives: YES; DNR/DNI; HCPOA: Monda Angry  Martin/daughter youngest.  Blind.               Social Drivers of Corporate investment banker Strain: Not on file  Food Insecurity: No Food Insecurity (01/02/2024)   Hunger Vital Sign    Worried About Running Out of Food in the Last Year: Never true    Ran Out of Food in the Last Year: Never true  Transportation Needs: No Transportation Needs (01/01/2024)   PRAPARE - Administrator, Civil Service (Medical): No    Lack of Transportation (Non-Medical): No  Physical Activity: Not on file  Stress: Not on file  Social Connections: Unknown (01/01/2024)   Social Connection and Isolation Panel [NHANES]    Frequency of Communication with Friends and Family: More than three times a week    Frequency of Social Gatherings with Friends and Family: Patient declined    Attends Religious Services: Patient declined    Database administrator or Organizations: Patient declined    Attends Banker Meetings: Patient declined    Marital Status: Patient declined    Allergies:  Allergies  Allergen Reactions   Bee Venom Anaphylaxis   Iodinated Contrast Media Swelling and Other (See Comments)    Throat swelling, stomach ache, tremors   Iodine  Anaphylaxis and Swelling    Throat swelling   Sudafed [Pseudoephedrine] Shortness Of Breath and Palpitations   Tessalon [Benzonatate] Rash   Vueway  [Gadopiclenol ] Shortness Of Breath, Anxiety and Rash    03/14/24:pt developed SOB and chest tightness after receiving IV MRI contrast. Also broke out in rash on upper arms. Pt will need to be done in hospital in the future for MRI W   Xanax [Alprazolam] Anaphylaxis and Other (See Comments)    Respiratory arrest   Azithromycin  Rash    Broke out in a rash   Budesonide -Formoterol  Fumarate Other (See Comments)    Blisters inside of mouth  all over   Crestor  [Rosuvastatin  Calcium ] Other (See Comments)    Severe leg weakness Unable to walk   Flonase [Fluticasone Propionate] Other (See Comments)     Epistaxis    Lamictal  [Lamotrigine ] Diarrhea, Rash and Other (See Comments)    Difficulty breathing   Lotrimin [Clotrimazole] Other (See Comments)    Mouth blisters   Lunesta [Eszopiclone] Other (See Comments)    REACTION: "slept for a week"   Oxcarbazepine Other (See Comments)    Causes deep sleep and dizziness   Statins Other (See Comments)    Severe leg weakness Unable to walk   Ambien [Zolpidem Tartrate] Other (See Comments)    "Slept for a week"   Betadine [Povidone Iodine ] Other (See Comments)    Breathing problems   Bevespi  Aerosphere [Glycopyrrolate -Formoterol ] Other (See Comments)    Pt believes this caused mouth sores and thrush    Biaxin  [Clarithromycin ] Other (See Comments)    All "mycins", Puts into "a" fib, Will take if has to for severe sinus infection   Breztri  Aerosphere [Budeson-Glycopyrrol-Formoterol ] Other (See Comments)    Thrush   Claritin-D [Loratadine-Pseudoephedrine Er] Other (See Comments)    Tremors, shaking   Effexor  [Venlafaxine ] Nausea And Vomiting and Other (See Comments)    cramps   Lexapro  [Escitalopram  Oxalate] Other (See Comments)    Hallucinations    Nexium [Esomeprazole Magnesium ] Other (See Comments)    Hyperactivity    Aciphex [Rabeprazole Sodium] Rash   Avelox [Moxifloxacin Hcl In Nacl] Other (See Comments)    Stomach cramps   Bentyl [Dicyclomine] Rash   Bextra [Valdecoxib] Rash   Ceclor [Cefaclor] Rash   Covera-Hs  [Verapamil  Hcl] Palpitations   Dicyclomine Hcl Rash   Estrace [Estradiol] Other (See Comments)    Breast soreness (severe).   Fosamax  [Alendronate  Sodium] Other (See Comments)    Stomach issues   Keflex [Cephalexin] Rash and Other (See Comments)    Pt states that she is possibly allergic to this - had a reaction to Cefaclor in the past and she does not want to these class drugs. Added per patient request.   Other Other (See Comments)    Glue from ekg/heart monitor leads --rash, Any MYCINS    Metabolic Disorder  Labs: Lab Results  Component Value Date   HGBA1C 6.3 (H) 01/17/2024   MPG 120 (H) 05/04/2013   No results found for: "PROLACTIN" Lab Results  Component Value Date   CHOL 124 01/17/2024   TRIG 150 (H) 01/17/2024   HDL 44 01/17/2024   CHOLHDL 2.8 01/17/2024   VLDL 16.1 (H) 07/02/2021   LDLCALC 54 01/17/2024   LDLCALC 52 09/17/2020   Lab Results  Component Value Date   TSH 1.394 01/01/2024   TSH 3.760 12/25/2019    Therapeutic Level Labs: No results found for: "LITHIUM" No results found for: "VALPROATE" No results found for: "CBMZ"  Current Medications: Current Outpatient Medications  Medication Sig Dispense Refill   acetaminophen  (TYLENOL ) 500 MG tablet Take 1,000 mg by mouth 2 (two) times daily as needed for moderate pain (pain score 4-6) or headache.     desvenlafaxine  (PRISTIQ ) 25 MG 24 hr tablet Take 1 tablet (25 mg total) by mouth daily. (Patient taking differently: Take 25 mg by mouth daily at 4 PM.) 30 tablet 2   diltiazem  (CARDIZEM  CD) 240 MG 24 hr capsule Take 1 capsule (240 mg total) by mouth daily. 30 capsule 3   diltiazem  (CARDIZEM ) 30 MG tablet Take 1 tablet (30 mg total) by mouth every  8 (eight) hours as needed (Heart rate greater than 130). (Patient taking differently: Take 30 mg by mouth as needed (Heart rate greater than 130).) 90 tablet 2   Evolocumab  (REPATHA  SURECLICK) 140 MG/ML SOAJ Inject 140 mg into the skin every 14 (fourteen) days. 6 mL 1   furosemide  (LASIX ) 40 MG tablet TAKE 1 TABLET BY MOUTH EVERY DAY 90 tablet 2   gabapentin  (NEURONTIN ) 100 MG capsule Take 200 mg by mouth as needed (neuropathy).     HYDROcodone -acetaminophen  (NORCO/VICODIN) 5-325 MG tablet Take 1 tablet by mouth 2 (two) times daily.     ipratropium (ATROVENT ) 0.02 % nebulizer solution Take 2.5 mLs (0.5 mg total) by nebulization 4 (four) times daily. 300 mL 11   metoprolol  tartrate (LOPRESSOR ) 25 MG tablet Take 1 tablet (25 mg total) by mouth 2 (two) times daily. 180 tablet 3    nitroGLYCERIN  (NITROSTAT ) 0.4 MG SL tablet PLACE 1 TABLET UNDER THE TONGUE EVERY 5 MINUTES AS NEEDED FOR CHEST PAIN. 25 tablet 7   OXYGEN Inhale 2 L/min into the lungs continuous.     pantoprazole  (PROTONIX ) 40 MG tablet TAKE 1 TABLET BY MOUTH EVERY DAY 30 tablet 5   polyethylene glycol (MIRALAX  / GLYCOLAX ) 17 g packet Take 17 g by mouth every other day.     spironolactone  (ALDACTONE ) 25 MG tablet TAKE 1/2 TABLET BY MOUTH EVERY DAY 15 tablet 11   traZODone  (DESYREL ) 100 MG tablet Take one tab daily as needed for sleep (Patient taking differently: Take 100 mg by mouth at bedtime.) 30 tablet 2   warfarin (COUMADIN ) 5 MG tablet TAKE ONE-HALF TO 1 TABLET BY MOUTH DAILY OR AS DIRECTED BY CLINIC 30 tablet 3   No current facility-administered medications for this visit.     Musculoskeletal: Strength & Muscle Tone: within normal limits Gait & Station: normal Patient leans: N/A  Psychiatric Specialty Exam: Review of Systems  Eyes:        Right vision impairment  Musculoskeletal:  Positive for back pain.  Neurological:  Positive for numbness.    Blood pressure 132/78, pulse 68, height 5\' 4"  (1.626 m), weight 191 lb (86.6 kg).Body mass index is 32.79 kg/m.  General Appearance: Casual and on nasal oxygen  Eye Contact:  Good  Speech:  Slow  Volume:  Normal  Mood:  Anxious  Affect:  Constricted  Thought Process:  Descriptions of Associations: Intact  Orientation:  Full (Time, Place, and Person)  Thought Content: Rumination   Suicidal Thoughts:  No  Homicidal Thoughts:  No  Memory:  Immediate;   Good Recent;   Good Remote;   Fair  Judgement:  Intact  Insight:  Present  Psychomotor Activity:  Decreased  Concentration:  Concentration: Fair and Attention Span: Fair  Recall:  Good  Fund of Knowledge: Good  Language: Good  Akathisia:  No  Handed:  Right  AIMS (if indicated): not done  Assets:  Communication Skills Desire for Improvement Housing Transportation  ADL's:  Intact   Cognition: WNL  Sleep:  Fair   Screenings: Mini-Mental    Flowsheet Row Office Visit from 01/17/2024 in [facility] Office Visit from 01/06/2023 in [facility]  Total Score (max 30 points ) 27 30      PHQ2-9    Flowsheet Row Office Visit from 12/30/2023 in [facility] Office Visit from 07/16/2021 in [facility] Visit from 07/02/2021 in [facility] [place]  [facility] Office Visit from 12/30/2020 in Primary Care at [facility] Visit from 11/13/2020 in Primary Care at [facility] Total Score 2 2 2 1 1   PHQ-9 Total Score 5 9 10  -- --      Flowsheet Row ED to Hosp-Admission (Discharged) from 01/01/2024 in [facility] ED from 12/05/2021 in [facility] Emergency Department at [facility] ED from 08/18/2021 in [facility] Emergency Department at [facility]  C-SSRS RISK CATEGORY No Risk No Risk No Risk        Assessment and Plan: Patient is 78 year old female with history of hypertension, A-fib, chronic systolic heart failure, COPD and neuropathy.  Reviewed notes from hospitalization in March due to exacerbation of hypertension.  Medicines were changed.  Her blood pressure is better.  Discussed family stress and anxiety.  She is taking Pristiq  25 mg and trazodone  100 mg at bedtime.  We have discussed therapy in the past but patient does not feel she can afford and she had a good neighbors that she talk to on a regular basis.  Now she has a lady who helps cleaning and that helps.  She also drives short distance.  Hemoglobin A1c stable.  I offered optimizing Pristiq  but patient had tried in the past that did not go well.  I also offer adding a low-dose BuSpar to help with anxiety but patient refused as she is already taking too many medication.  She like to keep the current medication.  Reassurance  given.  Brief psychotherapy given about coping skills.  Encourage walking, watching movie reading books or listening music when she is under stress.  So far no major concern for the medication.  Continue Pristiq  25 mg daily and trazodone  100 mg at bedtime.  She is also on gabapentin  100 mg 3 times a day prescribed by other provider to help her neuropathy.  She can optimize that dose if she feels nervous or anxious.  Recommend to call us  back if she is any question or any concern.  Will follow-up in 3 months.   Collaboration of Care: Collaboration of Care: Other provider involved in patient's care AEB notes are available in epic to review  Patient/Guardian was advised Release of Information must be obtained prior to any record release in order to collaborate their care with an outside provider. Patient/Guardian was advised if they have not already done so to contact the registration department to sign all necessary forms in order for us  to release information regarding their care.   Consent: Patient/Guardian gives verbal consent for treatment and assignment of benefits for services provided during this visit. Patient/Guardian expressed understanding and agreed to proceed.   I provided 32 minutes face to face time during this encounter.   Arturo Late, MD 03/30/2024, 8:29 AM

## 2024-04-04 ENCOUNTER — Ambulatory Visit: Attending: Cardiology | Admitting: *Deleted

## 2024-04-04 DIAGNOSIS — I480 Paroxysmal atrial fibrillation: Secondary | ICD-10-CM

## 2024-04-04 DIAGNOSIS — Z7901 Long term (current) use of anticoagulants: Secondary | ICD-10-CM | POA: Diagnosis not present

## 2024-04-04 LAB — POCT INR: INR: 2.2 (ref 2.0–3.0)

## 2024-04-04 NOTE — Patient Instructions (Signed)
 Description   Continue taking warfarin 1/2 tablet daily except 1 tablet on Mondays and Fridays.  Recheck INR in 6 weeks.  Coumadin  Clinic [phone]

## 2024-04-30 ENCOUNTER — Other Ambulatory Visit: Payer: Self-pay | Admitting: Cardiology

## 2024-05-02 ENCOUNTER — Other Ambulatory Visit: Payer: Self-pay | Admitting: Cardiology

## 2024-05-02 ENCOUNTER — Encounter: Payer: Self-pay | Admitting: Cardiology

## 2024-05-02 ENCOUNTER — Telehealth: Payer: Self-pay | Admitting: Cardiology

## 2024-05-02 MED ORDER — CLOPIDOGREL BISULFATE 75 MG PO TABS: 75.0000 mg | ORAL_TABLET | Freq: Every day | ORAL | 3 refills | Status: AC

## 2024-05-02 NOTE — Telephone Encounter (Signed)
 This has been addressed in a separate encounter from today.  Please see that encounter for complete details.

## 2024-05-02 NOTE — Telephone Encounter (Signed)
 Pt c/o medication issue:  1. Name of Medication: Clopidogrel    2. How are you currently taking this medication (dosage and times per day)? Na   3. Are you having a reaction (difficulty breathing--STAT)? Na   4. What is your medication issue? Pt stated she sent in for a refill but it was denied.  She would like to know why this med was denied and is she suppose to be taking it   Best number [phone]

## 2024-05-02 NOTE — Telephone Encounter (Signed)
 Patient is aware that her Plavix  is ready for pickup and stated she will pick them up tomorrow

## 2024-05-02 NOTE — Telephone Encounter (Signed)
 Per OV note on 03/15/24 by [place]:   S/p right retinal artery occlusion. Plavix  added to coumadin  due to severe vascular disease. I feel benefit of Plavix /coumadin  combination outweighs risk of bleeding at this time.   Refill sent to pharmacy at this time.

## 2024-05-10 ENCOUNTER — Other Ambulatory Visit (HOSPITAL_COMMUNITY): Payer: Self-pay | Admitting: Psychiatry

## 2024-05-10 DIAGNOSIS — F331 Major depressive disorder, recurrent, moderate: Secondary | ICD-10-CM

## 2024-05-10 DIAGNOSIS — F411 Generalized anxiety disorder: Secondary | ICD-10-CM

## 2024-05-16 ENCOUNTER — Ambulatory Visit: Attending: Cardiology | Admitting: *Deleted

## 2024-05-16 DIAGNOSIS — I480 Paroxysmal atrial fibrillation: Secondary | ICD-10-CM | POA: Diagnosis not present

## 2024-05-16 DIAGNOSIS — Z7901 Long term (current) use of anticoagulants: Secondary | ICD-10-CM | POA: Diagnosis not present

## 2024-05-16 LAB — POCT INR: INR: 2 (ref 2.0–3.0)

## 2024-05-16 NOTE — Progress Notes (Signed)
Please see anticoagulation encounter.

## 2024-05-16 NOTE — Patient Instructions (Signed)
 Description   Today take 1 tablet of warfarin then continue taking warfarin 1/2 tablet daily except 1 tablet on Mondays and Fridays.  Recheck INR in 6 weeks.  Coumadin  Clinic [phone]

## 2024-05-29 ENCOUNTER — Other Ambulatory Visit (HOSPITAL_COMMUNITY): Payer: Self-pay

## 2024-05-29 ENCOUNTER — Telehealth: Payer: Self-pay

## 2024-05-29 NOTE — Telephone Encounter (Signed)
 Pharmacy Patient Advocate Encounter  Received notification from [facility] that Prior Authorization for REPATHA  has been APPROVED from 05/29/24 to 11/01/24. Ran test claim, Copay is $35. This test claim was processed through [facility]- copay amounts may vary at other pharmacies due to pharmacy/plan contracts, or as the patient moves through the different stages of their insurance plan.

## 2024-05-29 NOTE — Telephone Encounter (Signed)
 Pharmacy Patient Advocate Encounter   Received notification from CoverMyMeds that prior authorization for REPATHA  is required/requested.   Insurance verification completed.   The patient is insured through [employer] .   Per test claim: PA required; PA submitted to above mentioned insurance via CoverMyMeds Key/confirmation #/[id number] Status is pending

## 2024-05-30 NOTE — Progress Notes (Signed)
 Cardiology Office Note    Date:  06/08/2024   ID:  Laurie Horton 03-08-1946, MRN [id number]  PCP:  Verdia Lombard, MD  Cardiologist: Dr. Swazil[place]   Chief Complaint  Patient presents with   Atrial Fibrillation   Congestive Heart Failure     History of Present Illness:    Laurie Horton is a 78 y.o. female with past medical history of chronic diastolic CHF, PFO, PAF (on Coumadin ), COPD (on 2L [place] at baseline), HTN, and prior CVA who is seen for follow up CHF and PAD.  She was admitted from 4/9 - 02/12/2017 for worsening dyspnea on exertion and palpitations. Was in atrial fibrillation with RVR upon arrival to the ED. Echo during admission showed a newly reduced EF of 20-25% and she was diuresed with IV Lasix . Enzymes were negative and EKG showed no acute ischemic changes. It was recommended to consider a right/left heart cath in 4-6 weeks.   She did undergo right and left heart cath on 03/04/17. This showed severe 2 vessel obstructive CAD with 100% RCA occlusion, 75% OM1, and 90% small OM2. EF 25-30%. Mild pulmonary HTN with normal LV filling pressures. 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 Maximizing CHF therapy recommended.  She did have repeat Echo in September 2018 showing improvement in EF to 50-55%.   Subsequent to this she developed significant claudication. She was seen by Dr. Court and had angiography showing 80% infrarenal aortic stenosis and left iliac stenosis. She also had severe right common femoral stenosis. She was seen by Dr Serene for consideration of Aortobifemoral bypass. After pulmonary evaluation she was felt to be too high a risk for open surgery. In March 2019 she underwent atherectomy and covered stenting of the left iliac by Dr. Court. On follow up she did have improvement in her claudication and ABIs. The aortic and right common femoral artery stenoses are not felt to be amenable to percutaneous therapy.   In early July 2019 she was seen because she  felt she was in Afib following dental procedure. On arrival she was in NSR with PACs. No medical changes made.   She has been followed by Dr Court. She was noted to have a high grade left subclavian stenosis but it was unclear that this was symptomatic and given all her medical problems was felt best to manage medically. She had left shoulder injection by Dr Anderson for adhesive capsulitis. She notes this has helped with her pain significantly. She is followed by pulmonary for COPD.   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 INR had been therapeutic. At time of infarct it was 1.9. We decided to add plavix  75 mg daily due to her extensive vascular disease. MRI showed no acute infarct but she did have evidence of multiple old strokes.   She had PNA in Dec/Jan. Followed by Dr Tonna for COPD exacerbation. Had CT done. Aortic size at 4.1 cm.    Patient presented to the hospital on 12/06/2021 with palpitations, chest pain, she was found to be in A-fib with RVR.  Serial troponin went from 9 --> 24 --> 36.  While in the ED, patient converted to sinus rhythm spontaneously. She was seen by the cardiology fellow who felt that she was not in CHF. Echocardiogram obtained on the same day showed EF 55 to 60%, no regional wall motion abnormality, mild LVH, mild LAE. She was discharged to follow-up with  cardiology service as outpatient.  CT of the head obtained on 12/08/2021 was negative for acute intracranial abnormality.  Heart monitor obtained in February showed several episodes of A-fib and SVT, however A-fib burden was fairly low at 2%.  She was admitted 01/01/2024 in the setting of atrial fibrillation with RVR.  Cardiology was consulted.  She was transitioned to oral diltiazem .  She spontaneously converted to normal sinus rhythm.  Echocardiogram showed EF 65 to 70%, no RWMA, mild LVH, normal RV, mild LAE, trivial MR, aortic sclerosis without evidence of  stenosis, mild dilation of the ascending aorta measuring 41 mm. When seen back in the office she was in Atrial flutter with RVR. Continued on diltiazem  and Coreg  switched to metoprolol . Seen in Afib clinic and by Dr Inocencio. Noted in and out of Afib. Diltiazem  dose increased. Only AAD option would be Tikosyn and QTc is mildly prolonged.   On follow up today she is mainly having issues with COPD. On home oxygen. No cough. Is not really aware of Afib. Hasn't had to take extra diltiazem . Only minor rectal bleeding from hemorrhoids. No swelling. No chest pain.     Past Medical History:  Diagnosis Date   Acute diastolic heart failure, NYHA class 1 (HCC) 02/08/2017   Acute heart failure (HCC) 02/08/2017   Cataract    OD   CHF (congestive heart failure) (HCC) 03/02/2017   EF 25-30% 2018   Complication of anesthesia    various issues with oxygen  saturations post op   COPD (chronic obstructive pulmonary disease) (HCC)    Depression    Depression    Phreesia 11/04/2020   Diverticulitis    DVT (deep venous thrombosis) (HCC)    Fibromyalgia    GERD (gastroesophageal reflux disease)    Heart murmur    Phreesia 11/04/2020   Hiatal hernia    History of deviated nasal septum    left- side   HTN (hypertension)    Hyperlipidemia    Hypertension    Phreesia 11/04/2020   Hypertensive retinopathy    OU   Myocardial infarction (HCC)    Phreesia 11/04/2020   Obesity    On supplemental oxygen therapy    concentrator at night @ 1.5 l/m or when sleeps. O2 Sat niormally 87.   Osteoporosis    Osteoporosis    Phreesia 11/04/2020   Oxygen deficiency    Phreesia 11/04/2020   PAT (paroxysmal atrial tachycardia) (HCC)    PFO (patent foramen ovale)    PULMONARY NODULE, LEFT LOWER LOBE 10/14/2009   5mm LLL nodule dec 2010. Stable and 4mm in Oct 2012. 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  Persists on cxr 07/22/11. Cleared on CT 08/24/11. No further followup   Stroke [facility])    TOBACCO ABUSE 06/04/2009    Past Surgical History:  Procedure Laterality Date   ABDOMINAL HYSTERECTOMY N/A    [employer]reesia 11/04/2020   CARDIOVASCULAR STRESS TEST  12/26/2004   EF 74%. NO EVIDENCE OF ISCHEMIA   CATARACT EXTRACTION Left    Dr. FABIENE Gaudy   ESOPHAGOGASTRODUODENOSCOPY (EGD) WITH PROPOFOL  N/A 04/15/2015   Procedure: ESOPHAGOGASTRODUODENOSCOPY (EGD) WITH PROPOFOL ;  Surgeon: Lynwood Bohr, MD;  Location: [facility] END[facility]COPY;  Service: Endoscopy;  Laterality: N/A;   EYE SURGERY Left    Cat Sx   JOINT REPLACEMENT N/A    Phreesia 11/04/2020   KNEE ARTHROSCOPY  2000   left  LAPAROSCOPIC CHOLECYSTECTOMY  04-16-2010   [facility]   LOWER EXTREMITY ANGIOGRAPHY N/A 09/09/2017   Procedure: Lower Extremity Angiography;  Surgeon: Court Dorn PARAS, MD;  [facility] INVASIVE CV LAB;  Service: Cardiovascular;  Laterality: N/A;   LOWER EXTREMITY INTERVENTION Left 01/17/2018   Procedure: LOWER EXTREMITY INTERVENTION;  Surgeon: Court Dorn PARAS, MD;  Location: [facility] INVASIVE CV LAB;  Service: Cardiovascular;  Laterality: Left;   MOUTH SURGERY     03-26-15 multiple extractions stitches remains   PERIPHERAL VASCULAR INTERVENTION Left 01/17/2018   Procedure: PERIPHERAL VASCULAR INTERVENTION;  Surgeon: Court Dorn PARAS, MD;  Location: [facility] INVASIVE CV LAB;  Service: Cardiovascular;  Laterality: Left;  COMMON ILIAC   RIGHT/LEFT HEART CATH AND CORONARY ANGIOGRAPHY N/A 03/04/2017   Procedure: Right/Left Heart Cath and Coronary Angiography;  Surgeon: Jolicia Delira M Swaziland, MD;  Location: [facility] INVASIVE CV LAB;  Service: Cardiovascular;  Laterality: N/A;   TOTAL ABDOMINAL HYSTERECTOMY     post op needed oxygen was told she gave them a scare   TUBAL LIGATION     US  ECHOCARDIOGRAPHY  11/20/2009   EF 55-60%    Current Medications: Outpatient Medications Prior to Visit  Medication Sig Dispense Refill   acetaminophen  (TYLENOL ) 500 MG tablet  Take 1,000 mg by mouth 2 (two) times daily as needed for moderate pain (pain score 4-6) or headache.     clopidogrel  (PLAVIX ) 75 MG tablet Take 1 tablet (75 mg total) by mouth daily. 90 tablet 3   desvenlafaxine  (PRISTIQ ) 25 MG 24 hr tablet Take 1 tablet (25 mg total) by mouth daily at 4 PM. 30 tablet 2   diltiazem  (CARDIZEM  CD) 240 MG 24 hr capsule Take 1 capsule (240 mg total) by mouth daily. 30 capsule 3   diltiazem  (CARDIZEM ) 30 MG tablet Take 1 tablet (30 mg total) by mouth every 8 (eight) hours as needed (Heart rate greater than 130). (Patient taking differently: Take 30 mg by mouth as needed (Heart rate greater than 130).) 90 tablet 2   Evolocumab  (REPATHA  SURECLICK) 140 MG/ML SOAJ Inject 140 mg into the skin every 14 (fourteen) days. 6 mL 1   furosemide  (LASIX ) 40 MG tablet TAKE 1 TABLET BY MOUTH EVERY DAY 90 tablet 2   gabapentin  (NEURONTIN ) 100 MG capsule Take 200 mg by mouth as needed (neuropathy).     HYDROcodone -acetaminophen  (NORCO/VICODIN) 5-325 MG tablet Take 1 tablet by mouth 2 (two) times daily.     ipratropium (ATROVENT ) 0.02 % nebulizer solution Take 2.5 mLs (0.5 mg total) by nebulization 4 (four) times daily. 300 mL 11   metoprolol  tartrate (LOPRESSOR ) 25 MG tablet Take 1 tablet (25 mg total) by mouth 2 (two) times daily. 180 tablet 3   nitroGLYCERIN  (NITROSTAT ) 0.4 MG SL tablet PLACE 1 TABLET UNDER THE TONGUE EVERY 5 MINUTES AS NEEDED FOR CHEST PAIN. 25 tablet 7   NON FORMULARY as needed (Hempvana).     OXYGEN Inhale 2 L/min into the lungs continuous.     pantoprazole  (PROTONIX ) 40 MG tablet TAKE 1 TABLET BY MOUTH EVERY DAY 30 tablet 5   polyethylene glycol (MIRALAX  / GLYCOLAX ) 17 g packet Take 17 g by mouth every other day.     spironolactone  (ALDACTONE ) 25 MG tablet TAKE 1/2 TABLET BY MOUTH EVERY DAY 15 tablet 11   traZODone  (DESYREL ) 100 MG tablet Take one tab daily as needed for sleep 30 tablet 2   warfarin (COUMADIN ) 5 MG tablet TAKE ONE-HALF TO 1 TABLET BY MOUTH DAILY OR  AS DIRECTED BY CLINIC  30 tablet 3   No facility-administered medications prior to visit.     Allergies:   Bee venom, Iodinated contrast media, Iodine , Sudafed [pseudoephedrine], Tessalon [benzonatate], Vueway  [gadopiclenol ], Xanax [alprazolam], Azithromycin , Budesonide -formoterol  fumarate, Crestor  [rosuvastatin  calcium ], Flonase [fluticasone propionate], Lamictal  [lamotrigine ], Lotrimin [clotrimazole], Lunesta [eszopiclone], Oxcarbazepine, Statins, Ambien [zolpidem tartrate], Betadine [povidone iodine ], Bevespi  aerosphere [glycopyrrolate -formoterol ], Biaxin  [clarithromycin ], Breztri  aerosphere [budeson-glycopyrrol-formoterol ], Claritin-d [loratadine-pseudoephedrine er], Effexor  [venlafaxine ], Lexapro  [escitalopram  oxalate], Nexium [esomeprazole magnesium ], Aciphex [rabeprazole sodium], Avelox [moxifloxacin hcl in nacl], Bentyl [dicyclomine], Bextra [valdecoxib], Ceclor [cefaclor], Covera-hs  [verapamil  hcl], Dicyclomine hcl, Estrace [estradiol], Fosamax  [alendronate  sodium], Keflex [cephalexin], and Other   Social History   Socioeconomic History   Marital status: Divorced    Spouse name: Not on file   Number of children: 2   Years of education: Not on file   Highest education level: Not on file  Occupational History   Occupation: disability  Tobacco Use   Smoking status: Former    Current packs/day: 0.00    Average packs/day: 3.0 packs/day for 30.0 years (90.0 ttl pk-yrs)    Types: Cigarettes    Start date: 06/02/1980    Quit date: 06/02/2010    Years since quitting: 14.0   Smokeless tobacco: Never   Tobacco comments:    QUIT IN 2011  Vaping Use   Vaping status: Never Used  Substance and Sexual Activity   Alcohol use: No    Alcohol/week: 0.0 standard drinks of alcohol   Drug use: No   Sexual activity: Yes    Partners: Male    Birth control/protection: Post-menopausal, Surgical  Other Topics Concern   Not on file  Social History Narrative   Marital status: divorced in 1978 after ten  years; dating casually in 2019.      Children: 3 biological children; 3 court appointed children; 6 grandchildren; 5 gg      Lives: alone; children in [place] and [place].      Employment: retired age 57; disability for COPD, CVA at age 2.      Tobacco: former smoker; quit smoking 2011.      Alcohol: none      Exercise: walks dog four times per day; goes to pool three times per week.      ADLs: drives; no assistant devices; does have a walker.  Cleaning is limited in 2018.  Daughter helps with cleaning.  Does own grocery shopping.      Advanced Directives: YES; DNR/DNI; HCPOA: [employer]/daughter youngest.  Blind.               Social Drivers of Corporate investment banker Strain: Not on file  Food Insecurity: No Food Insecurity (01/02/2024)   Hunger Vital Sign    Worried About Running Out of Food in the Last Year: Never true    Ran Out of Food in the Last Year: Never true  Transportation Needs: No Transportation Needs (01/01/2024)   PRAPARE - Administrator, Civil Service (Medical): No    Lack of Transportation (Non-Medical): No  Physical Activity: Not on file  Stress: Not on file  Social Connections: Unknown (01/01/2024)   Social Connection and Isolation Panel    Frequency of Communication with Friends and Family: More than three times a week    Frequency of Social Gatherings with Friends and Family: Patient declined    Attends Religious Services: Patient declined    Database administrator or Organizations: Patient declined    Attends Banker Meetings: Patient declined  Marital Status: Patient declined     Family History:  The patient's family history includes Alzheimer's disease in her mother; Dementia in her mother; Diabetes in her mother and sister; Heart attack in her father; Heart attack (age of onset: 4) in her brother; Schizophrenia in her sister; Tremor in her sister.   Review of Systems:   As noted in HPI.  All other systems  reviewed and are otherwise negative except as noted above.   Physical Exam:    VS:  BP 133/70 (BP Location: Right Wrist)   Pulse 84   Ht 5' 2.5 (1.588 m)   Wt 187 lb (84.8 kg)   SpO2 90%   BMI 33.66 kg/m    GENERAL:  Well appearing overweight WF in NAD HEENT:  PERRL, EOMI, sclera are clear. Oropharynx is clear. NECK:  No jugular venous distention, carotid upstroke brisk and symmetric, right carotid bruit,  left subclavian  bruit, no thyromegaly or adenopathy LUNGS:  Clear to auscultation bilaterally CHEST:  + scoliosis.  HEART:  RRR,  PMI not displaced or sustained,S1 and S2 within normal limits, no S3, no S4: no clicks, no rubs, no murmurs.  ABD:  Soft, nontender. BS +, no masses or bruits. No hepatomegaly, no splenomegaly EXT:  Poor pedal pulses, absent left radial pulse.  no edema, no cyanosis no clubbing SKIN:  Warm and dry.  No rashes NEURO:  Alert and oriented x 3. Cranial nerves II through XII intact. PSYCH:  Cognitively intact    Wt Readings from Last 3 Encounters:  06/08/24 187 lb (84.8 kg)  03/15/24 184 lb (83.5 kg)  03/07/24 186 lb 6.4 oz (84.6 kg)     Studies/Labs Reviewed:   Recent Labs: 01/01/2024: B Natriuretic Peptide 219.3; Magnesium  1.8; TSH 1.394 01/02/2024: Hemoglobin 12.6; Platelets 163 01/03/2024: BUN 14; Creatinine, Ser 0.96; Potassium 3.8; Sodium 137   Lipid Panel    Component Value Date/Time   CHOL 124 01/17/2024 0839   TRIG 150 (H) 01/17/2024 0839   HDL 44 01/17/2024 0839   CHOLHDL 2.8 01/17/2024 0839   CHOLHDL 3 07/02/2021 1017   VLDL 44.0 (H) 07/02/2021 1017   LDLCALC 54 01/17/2024 0839   LDLDIRECT 67.0 07/02/2021 1017  Dated 11/20/21: cholesterol 142, HDL 42,  Dated 03/23/22:  A1c 6.3%. triglycerides122   Additional studies/ records that were reviewed today include:   Echocardiogram: 02/09/2017 Study Conclusions   - Left ventricle: The cavity size was mildly dilated. Wall   thickness was increased in a pattern of mild LVH. Systolic    function was severely reduced. The estimated ejection fraction   was in the range of 20% to 25%. Diffuse hypokinesis. Doppler   parameters are consistent with abnormal left ventricular   relaxation (grade 1 diastolic dysfunction). - Aortic valve: Valve area (Vmax): 1.4 cm^2. - Aortic root: The aortic root was mildly dilated. - Ascending aorta: The ascending aorta was mildly dilated. - Mitral valve: Calcified annulus. There was moderate   regurgitation. - Left atrium: The atrium was moderately dilated. - Right ventricle: Systolic function was moderately reduced. - Pulmonary arteries: Systolic pressure was mildly increased. - Pericardium, extracardiac: A trivial pericardial effusion was   identified.   Impressions:   - No subcostal views; severe global reduction in LV systolic   function; grade 1 diastolic dysfunction; mildly dilated aortic   root and ascending aorta; moderate MR; moderaet LAE; moderately   reduced RV function; mild TR; mildly elevated pulmonary pressure.  Procedures   Right/Left Heart Cath and Coronary  Angiography  Conclusion     Ost 1st Mrg to 1st Mrg lesion, 75 %stenosed. 2nd Mrg lesion, 90 %stenosed. Ost RCA to Mid RCA lesion, 100 %stenosed. There is severe left ventricular systolic dysfunction. LV end diastolic pressure is normal. The left ventricular ejection fraction is 25-35% by visual estimate. Hemodynamic findings consistent with mild pulmonary hypertension. LV end diastolic pressure is normal.   1. Severe 2 vessel obstructive CAD    - 75% proximal OM1    - 90% small OM2    - 100% proximal RCA. Left to right collaterals.  2. Severe LV dysfunction 3. Mild pulmonary HTN with normal LV filling pressures.  4. Cardiac index 2.41 L/min/BSA     Plan: Medical management to try and optimize CHF therapy. Patient appears to be adequately diuresed at this time. Based on these results her cardiomyopathy is ischemic. I would treat her CAD medically. If cardiac  cath is needed in the future would consider alternative access given difficulty from the right radial approach. Her rhythm during procedure is a multifocal atrial rhythm/tachycardia.      Echo 07/08/17: Study Conclusions   - Left ventricle: The cavity size was normal. There was moderate   concentric hypertrophy. Systolic function was normal. The   estimated ejection fraction was in the range of 50% to 55%.   Severe hypokinesis of the basal-midinferior myocardium;   consistent with infarction in the distribution of the right   coronary artery. Doppler parameters are consistent with abnormal   left ventricular relaxation (grade 1 diastolic dysfunction). - Mitral valve: Calcified annulus.   Impressions:   - Compared to April 2018 there is marked improvement in contraction   of all LV wall segemnts except for the inferior wall, which   remains severely hypokinetic. There is also marked reduction in   the severity of mitral insufficiency.    Echo 12/06/2021  1. Left ventricular ejection fraction, by estimation, is 55 to 60%. The  left ventricle has normal function. The left ventricle has no regional  wall motion abnormalities. There is mild left ventricular hypertrophy.  Left ventricular diastolic parameters  were normal.   2. Right ventricular systolic function is normal. The right ventricular  size is normal.   3. Left atrial size was mildly dilated.   4. The mitral valve is normal in structure. No evidence of mitral valve  regurgitation. No evidence of mitral stenosis.   5. The aortic valve is tricuspid. There is mild calcification of the  aortic valve. There is mild thickening of the aortic valve. Aortic valve  regurgitation is not visualized. Aortic valve sclerosis is present, with  no evidence of aortic valve stenosis.   6. The inferior vena cava is normal in size with greater than 50%  respiratory variability, suggesting right atrial pressure of 3 mmHg.     Monitor 01/02/22:  Study Highlights    Normal sinus rhythm Atrial fibrillation with RVR on 12/19/21 and 12/21/21 with longest episode lasting 5 hours and 40 minutes and average HR 120 bpm. AFib burden 2% Frequent PACs with few short bursts of SVT longest lasting 15 beats Occasional PVCs with rare NSVT longest lasting 5 beats. Symptoms appear to correlate with Afib an SVT.     Patch Wear Time:  13 days and 23 hours (2023-02-10T10:12:14-0500 to 2023-02-24T09:21:34-0500)   Patient had a min HR of 43 bpm, max HR of 235 bpm, and avg HR of 71 bpm. Predominant underlying rhythm was Sinus Rhythm. First Degree AV Block was present.  2 Ventricular Tachycardia runs occurred, the run with the fastest interval lasting 5 beats with a  max rate of 235 bpm (avg 156 bpm); the run with the fastest interval was also the longest. 7 Supraventricular Tachycardia runs occurred, the run with the fastest interval lasting 4 beats with a max rate of 176 bpm, the longest lasting 15 beats with an  avg rate of 128 bpm. Some episodes of Supraventricular Tachycardia may be possible Atrial Tachycardia with variable block. Atrial Fibrillation occurred (2% burden), ranging from 75-167 bpm (avg of 120 bpm), the longest lasting 5 hours 40 mins with an avg  rate of 116 bpm. Supraventricular Tachycardia and Atrial Fibrillation were detected within +/- 45 seconds of symptomatic patient event(s). Isolated SVEs were frequent (11.5%, [id number]), SVE Couplets were frequent (5.5%, [id number]), and SVE Triplets were  occasional (1.7%, 8129). Isolated VEs were occasional (4.5%, [id number]), VE Couplets were rare (<1.0%, 596), and VE Triplets were rare (<1.0%, 41). Ventricular Bigeminy and Trigeminy were present.   Echo 01/02/24: IMPRESSIONS     1. Left ventricular ejection fraction, by estimation, is 65 to 70%. The  left ventricle has normal function. The left ventricle has no regional  wall motion abnormalities. There is mild left ventricular hypertrophy.  Left ventricular  diastolic function  could not be evaluated.   2. Right ventricular systolic function is normal. The right ventricular  size is normal.   3. Left atrial size was mildly dilated.   4. The mitral valve is abnormal. Trivial mitral valve regurgitation.   5. The aortic valve is tricuspid. Aortic valve regurgitation is not  visualized. Aortic valve sclerosis/calcification is present, without any  evidence of aortic stenosis.   6. Aortic dilatation noted. There is mild dilatation of the ascending  aorta, measuring 41 mm.   7. The inferior vena cava is normal in size with greater than 50%  respiratory variability, suggesting right atrial pressure of 3 mmHg.   Comparison(s): Changes from prior study are noted. 12/06/2021: LVEF 55-60%,  mild LAE.   Assessment:    1. Paroxysmal atrial fibrillation (HCC)   2. Coronary artery disease involving native coronary artery of native heart without angina pectoris   3. Chronic HFrEF with Recovered EF   4. Essential hypertension            Plan:    1. Chronic Combined Systolic and Diastolic CHF/  Secondary to ischemic/tachycardia mediated Cardiomyopathy - EF 25-30% in 2018 with ischemic cardiomyopathy. Not a candidate for revascularization. With medical management EF improved.  Last Echo in March with EF 65-70% - she is euvolemic at this time. Weight is stable.   - continue BB, aldactone , lasix  40 mg daily and statin. ARB discontinued due to orthostatic dizziness.   2. Paroxysmal Atrial Fibrillation/ multifocal atrial tachycardia - This patients CHA2DS2-VASc Score and unadjusted Ischemic Stroke Rate (% per year) is equal to 9.7 % stroke rate/year from a score of 6 (CHF, HTN, Female, Age, CVA (2)). She denies any evidence of active bleeding. Continue Coumadin  for anticoagulation. On metoprolol  and diltiazem  for rate control. Really asymptomatic at this time. Consider Tikosyn if significant increase in Afib.   3. HTN- controlled.  4.  COPD/Emphysema- - followed by Pulmonology. On oxygen  5. PFO - no plans for closure   6. PAD s/p covered stenting of left iliac. Stenosis in distal aorta and right femoral not amenable to percutaneous therapy. Left subclavian stenosis. Claudication is stable.   7. CAD. 100% RCA. 75% OM. Chronic stable angina  class 1-2.  No active angina. Rarely uses Ntg.   8. S/p right retinal artery occlusion. Plavix  added to coumadin  due to severe vascular disease. I feel benefit of Plavix /coumadin  combination outweighs risk of bleeding at this time.  9. 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 Followed by Dr Rosemarie  10. Hypercholesterolemia. Now on Repatha . Excellent result with LDL 52  11. Carotid arterial disease. No new Neurologic symptoms. Dopplers in January 2023 were unchanged.   12. 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 Reassured her about this. She is not a candidate for aortic surgery  13. Musculoskeletal rib pain.   I will follow up in 6 months   Signed, Laurie Catlin Swaziland, MD  06/08/2024 9:18 AM    [facility] [address] [place], [place] [address] Phone: [phone]

## 2024-06-08 ENCOUNTER — Ambulatory Visit: Attending: Cardiology | Admitting: Cardiology

## 2024-06-08 ENCOUNTER — Encounter: Payer: Self-pay | Admitting: Cardiology

## 2024-06-08 VITALS — BP 133/70 | HR 84 | Ht 62.5 in | Wt 187.0 lb

## 2024-06-08 DIAGNOSIS — I480 Paroxysmal atrial fibrillation: Secondary | ICD-10-CM | POA: Diagnosis not present

## 2024-06-08 DIAGNOSIS — I10 Essential (primary) hypertension: Secondary | ICD-10-CM

## 2024-06-08 DIAGNOSIS — I5022 Chronic systolic (congestive) heart failure: Secondary | ICD-10-CM | POA: Diagnosis not present

## 2024-06-08 DIAGNOSIS — I2510 Atherosclerotic heart disease of native coronary artery without angina pectoris: Secondary | ICD-10-CM

## 2024-06-08 NOTE — Patient Instructions (Signed)
 Medication Instructions:  Continue same medications *If you need a refill on your cardiac medications before your next appointment, please call your pharmacy*  Lab Work: None ordered  Testing/Procedures: None ordered  Follow-Up: At [facility], you and your health needs are our priority.  As part of our continuing mission to provide you with exceptional heart care, our providers are all part of one team.  This team includes your primary Cardiologist (physician) and Advanced Practice Providers or APPs (Physician Assistants and Nurse Practitioners) who all work together to provide you with the care you need, when you need it.  Your next appointment:  6 months   Call in Oct to schedule Feb appointment     Provider:  Dr.Jordan   We recommend signing up for the patient portal called MyChart.  Sign up information is provided on this After Visit Summary.  MyChart is used to connect with patients for Virtual Visits (Telemedicine).  Patients are able to view lab/test results, encounter notes, upcoming appointments, etc.  Non-urgent messages can be sent to your provider as well.   To learn more about what you can do with MyChart, go to [email or web address].

## 2024-06-15 ENCOUNTER — Encounter: Payer: Self-pay | Admitting: Cardiology

## 2024-06-23 ENCOUNTER — Encounter: Payer: Self-pay | Admitting: Cardiology

## 2024-06-24 ENCOUNTER — Other Ambulatory Visit: Payer: Self-pay | Admitting: Cardiology

## 2024-06-24 DIAGNOSIS — I739 Peripheral vascular disease, unspecified: Secondary | ICD-10-CM

## 2024-06-24 DIAGNOSIS — I2510 Atherosclerotic heart disease of native coronary artery without angina pectoris: Secondary | ICD-10-CM

## 2024-06-27 ENCOUNTER — Ambulatory Visit: Attending: Cardiology | Admitting: Pharmacist

## 2024-06-27 DIAGNOSIS — I480 Paroxysmal atrial fibrillation: Secondary | ICD-10-CM

## 2024-06-27 DIAGNOSIS — Z7901 Long term (current) use of anticoagulants: Secondary | ICD-10-CM

## 2024-06-27 LAB — POCT INR: INR: 2.1 (ref 2.0–3.0)

## 2024-06-27 NOTE — Progress Notes (Signed)
 INR 2.1; Please see anticoagulation encounter   Description   Continue taking warfarin 1/2 tablet daily except 1 tablet on Mondays and Fridays.  Recheck INR in 6 weeks.  Coumadin  Clinic [phone]

## 2024-06-27 NOTE — Patient Instructions (Signed)
 Description   Continue taking warfarin 1/2 tablet daily except 1 tablet on Mondays and Fridays.  Recheck INR in 6 weeks.  Coumadin  Clinic [phone]

## 2024-07-06 ENCOUNTER — Ambulatory Visit (HOSPITAL_BASED_OUTPATIENT_CLINIC_OR_DEPARTMENT_OTHER): Admitting: Psychiatry

## 2024-07-06 ENCOUNTER — Encounter (HOSPITAL_COMMUNITY): Payer: Self-pay | Admitting: Psychiatry

## 2024-07-06 VITALS — BP 145/82 | HR 92 | Resp 22 | Ht 62.0 in | Wt 185.8 lb

## 2024-07-06 DIAGNOSIS — F5105 Insomnia due to other mental disorder: Secondary | ICD-10-CM | POA: Diagnosis not present

## 2024-07-06 DIAGNOSIS — F411 Generalized anxiety disorder: Secondary | ICD-10-CM

## 2024-07-06 DIAGNOSIS — F99 Mental disorder, not otherwise specified: Secondary | ICD-10-CM

## 2024-07-06 DIAGNOSIS — F331 Major depressive disorder, recurrent, moderate: Secondary | ICD-10-CM | POA: Diagnosis not present

## 2024-07-06 MED ORDER — TRAZODONE HCL 100 MG PO TABS: ORAL_TABLET | ORAL | 2 refills | Status: DC

## 2024-07-06 MED ORDER — DESVENLAFAXINE SUCCINATE ER 25 MG PO TB24: 25.0000 mg | ORAL_TABLET | Freq: Every day | ORAL | 2 refills | Status: DC
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
## 2024-07-12 ENCOUNTER — Other Ambulatory Visit: Payer: Self-pay | Admitting: Cardiology

## 2024-07-12 ENCOUNTER — Telehealth: Payer: Self-pay

## 2024-07-12 NOTE — Telephone Encounter (Signed)
 Received signed CMN from Dr. [name]. Faxed signed CMN back to [employer] at [phone]. Received fax confirmation, NFN

## 2024-07-14 NOTE — Telephone Encounter (Signed)
 Spoke to patient Dr.Jordan's advice given.

## 2024-07-15 ENCOUNTER — Other Ambulatory Visit: Payer: Self-pay | Admitting: Cardiology

## 2024-07-26 ENCOUNTER — Telehealth: Payer: Self-pay | Admitting: Neurology
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
## 2024-07-31 ENCOUNTER — Ambulatory Visit: Admitting: Neurology

## 2024-08-08 ENCOUNTER — Ambulatory Visit: Attending: Cardiovascular Disease | Admitting: Pharmacist

## 2024-08-08 DIAGNOSIS — I480 Paroxysmal atrial fibrillation: Secondary | ICD-10-CM

## 2024-08-08 DIAGNOSIS — Z7901 Long term (current) use of anticoagulants: Secondary | ICD-10-CM

## 2024-08-08 LAB — POCT INR: INR: 2.7 (ref 2.0–3.0)

## 2024-08-08 NOTE — Progress Notes (Signed)
 Description   INR 2.7: Continue taking warfarin 1/2 tablet daily except 1 tablet on Mondays and Fridays.  Recheck INR in 6 weeks.  Coumadin  Clinic [phone]

## 2024-08-08 NOTE — Patient Instructions (Addendum)
 Description   INR 2.7: Continue taking warfarin 1/2 tablet daily except 1 tablet on Mondays and Fridays.  Recheck INR in 6 weeks.  Coumadin  Clinic [phone]

## 2024-08-21 ENCOUNTER — Emergency Department (HOSPITAL_COMMUNITY)
Admission: EM | Admit: 2024-08-21 | Discharge: 2024-08-22 | Disposition: A | Discharge status: Home / Self Care | Attending: Emergency Medicine | Admitting: Emergency Medicine

## 2024-08-21 ENCOUNTER — Telehealth: Payer: Self-pay | Admitting: Cardiology

## 2024-08-21 ENCOUNTER — Other Ambulatory Visit: Payer: Self-pay

## 2024-08-21 DIAGNOSIS — I110 Hypertensive heart disease with heart failure: Secondary | ICD-10-CM | POA: Diagnosis not present

## 2024-08-21 DIAGNOSIS — Z7902 Long term (current) use of antithrombotics/antiplatelets: Secondary | ICD-10-CM | POA: Insufficient documentation

## 2024-08-21 DIAGNOSIS — Z8673 Personal history of transient ischemic attack (TIA), and cerebral infarction without residual deficits: Secondary | ICD-10-CM | POA: Diagnosis not present

## 2024-08-21 DIAGNOSIS — J449 Chronic obstructive pulmonary disease, unspecified: Secondary | ICD-10-CM | POA: Diagnosis not present

## 2024-08-21 DIAGNOSIS — R42 Dizziness and giddiness: Secondary | ICD-10-CM | POA: Diagnosis present

## 2024-08-21 DIAGNOSIS — I5031 Acute diastolic (congestive) heart failure: Secondary | ICD-10-CM | POA: Insufficient documentation

## 2024-08-21 DIAGNOSIS — Z720 Tobacco use: Secondary | ICD-10-CM | POA: Insufficient documentation

## 2024-08-21 DIAGNOSIS — Z7901 Long term (current) use of anticoagulants: Secondary | ICD-10-CM | POA: Diagnosis not present

## 2024-08-21 DIAGNOSIS — R030 Elevated blood-pressure reading, without diagnosis of hypertension: Secondary | ICD-10-CM

## 2024-08-21 LAB — TROPONIN I (HIGH SENSITIVITY)
Troponin I (High Sensitivity): 8 ng/L (ref <18)
Troponin I (High Sensitivity): 11 ng/L (ref <18)

## 2024-08-21 LAB — URINALYSIS, ROUTINE W REFLEX MICROSCOPIC
Bacteria, UA: NONE SEEN
Bilirubin Urine: NEGATIVE
Glucose, UA: NEGATIVE mg/dL
Ketones, ur: NEGATIVE mg/dL
Nitrite: NEGATIVE
Protein, ur: NEGATIVE mg/dL
Specific Gravity, Urine: 1.004 — ABNORMAL LOW (ref 1.005–1.030)
pH: 7 (ref 5.0–8.0)

## 2024-08-21 LAB — COMPREHENSIVE METABOLIC PANEL WITH GFR
ALT: 13 U/L (ref 0–44)
AST: 22 U/L (ref 15–41)
Albumin: 4.3 g/dL (ref 3.5–5.0)
Alkaline Phosphatase: 70 U/L (ref 38–126)
Anion gap: 13 (ref 5–15)
BUN: 11 mg/dL (ref 8–23)
CO2: 26 mmol/L (ref 22–32)
Calcium: 10.1 mg/dL (ref 8.9–10.3)
Chloride: 96 mmol/L — ABNORMAL LOW (ref 98–111)
Creatinine, Ser: 1.07 mg/dL — ABNORMAL HIGH (ref 0.44–1.00)
GFR, Estimated: 53 mL/min — ABNORMAL LOW (ref >60)
Glucose, Bld: 118 mg/dL — ABNORMAL HIGH (ref 70–99)
Potassium: 3.8 mmol/L (ref 3.5–5.1)
Sodium: 135 mmol/L (ref 135–145)
Total Bilirubin: 1 mg/dL (ref 0.0–1.2)
Total Protein: 6.9 g/dL (ref 6.5–8.1)

## 2024-08-21 LAB — CBC
HCT: 43.7 % (ref 36.0–46.0)
Hemoglobin: 14.4 g/dL (ref 12.0–15.0)
MCH: 30.1 pg (ref 26.0–34.0)
MCHC: 33 g/dL (ref 30.0–36.0)
MCV: 91.4 fL (ref 80.0–100.0)
Platelets: 214 K/uL (ref 150–400)
RBC: 4.78 MIL/uL (ref 3.87–5.11)
RDW: 13.9 % (ref 11.5–15.5)
WBC: 9 K/uL (ref 4.0–10.5)
nRBC: 0 % (ref 0.0–0.2)

## 2024-08-21 LAB — CBG MONITORING, ED: Glucose-Capillary: 118 mg/dL — ABNORMAL HIGH (ref 70–99)

## 2024-08-21 MED ORDER — WARFARIN SODIUM 5 MG PO TABS
5.0000 mg | ORAL_TABLET | Freq: Once | ORAL | Status: DC
  Filled 2024-08-21: qty 1

## 2024-08-21 MED ORDER — METOPROLOL TARTRATE 25 MG PO TABS
25.0000 mg | ORAL_TABLET | Freq: Once | ORAL | Status: AC
  Administered 2024-08-21: 25 mg via ORAL
  Filled 2024-08-21: qty 1

## 2024-08-21 MED ORDER — WARFARIN - PHYSICIAN DOSING INPATIENT: Freq: Every day | Status: DC

## 2024-08-21 NOTE — Telephone Encounter (Signed)
 Voice mail not set up. Unable to leave voice mail 10/20

## 2024-08-21 NOTE — Telephone Encounter (Signed)
 Pt c/o medication issue:  1. Name of Medication:   diltiazem  (CARDIZEM ) 30 MG tablet    2. How are you currently taking this medication (dosage and times per day)?   3. Are you having a reaction (difficulty breathing--STAT)?   4. What is your medication issue?   Patient says BP is 156/94 and she would like to know if she needs to take an extra Diltiazem . She says it has been a little elevated the past few days causing some dizziness. She just wants to clarify parameters for taking extra tablet. She also mentions that she took a nitroglycerin  yesterday. Please advise.

## 2024-08-21 NOTE — ED Triage Notes (Signed)
 Patient arrives via [facility] for hypertension and dizziness. BP 160 palp with dizziness that has been ongoing since metoprolol  beginning. Dizziness getting worse. Acute angina yesterday at 1500 tingling down left arm, 2 nitroglycerin  were effective. Hx of strokes, MI, CHF, afib. In afib currently. Crackles in bilateral lowers.   HR 102 91 on 2 liters Kaycee which she wears as needed.  CBG 120  Alert and oriented x4

## 2024-08-21 NOTE — ED Provider Triage Note (Signed)
 Emergency Medicine Provider Triage Evaluation Note  Laurie Horton , a 78 y.o. female  was evaluated in triage.  Pt complains of dizziness sensation that began this morning.  She denies any chest pain or shortness of breath with this.  Denies any cough, fever, chills.  Review of Systems  Positive: As above Negative: As above  Physical Exam  BP (!) 138/96 (BP Location: Right Arm)   Pulse 84   Temp 98.2 F (36.8 C) (Oral)   Resp 16   SpO2 94%  Gen:   Awake, no distress   Resp:  Normal effort  MSK:   Moves extremities without difficulty    Medical Decision Making  Medically screening exam initiated at 7:16 PM.  Appropriate orders placed.  LURINE IMEL was informed that the remainder of the evaluation will be completed by another provider, this initial triage assessment does not replace that evaluation, and the importance of remaining in the ED until their evaluation is complete.     Veta Palma, PA-C 08/22/24 2055

## 2024-08-22 ENCOUNTER — Emergency Department (HOSPITAL_COMMUNITY)

## 2024-08-22 ENCOUNTER — Ambulatory Visit: Attending: Cardiology | Admitting: Cardiology

## 2024-08-22 ENCOUNTER — Encounter: Payer: Self-pay | Admitting: Cardiology

## 2024-08-22 VITALS — BP 130/74 | HR 98 | Ht 62.0 in | Wt 180.0 lb

## 2024-08-22 DIAGNOSIS — I480 Paroxysmal atrial fibrillation: Secondary | ICD-10-CM | POA: Diagnosis not present

## 2024-08-22 DIAGNOSIS — Z7901 Long term (current) use of anticoagulants: Secondary | ICD-10-CM | POA: Diagnosis not present

## 2024-08-22 DIAGNOSIS — I2510 Atherosclerotic heart disease of native coronary artery without angina pectoris: Secondary | ICD-10-CM

## 2024-08-22 DIAGNOSIS — I5022 Chronic systolic (congestive) heart failure: Secondary | ICD-10-CM

## 2024-08-22 DIAGNOSIS — I10 Essential (primary) hypertension: Secondary | ICD-10-CM

## 2024-08-22 NOTE — ED Provider Notes (Addendum)
 [place] EMERGENCY DEPARTMENT AT [facility] Provider Note   CSN: [id number] Arrival date & time: 08/21/24  1717     Patient presents with: No chief complaint on file.   Laurie Horton is a 78 y.o. female.   The history is provided by the patient.  Hypertension This is a chronic problem. The current episode started more than 1 week ago. The problem occurs constantly. The problem has been gradually worsening. Pertinent negatives include no chest pain, no abdominal pain, no headaches and no shortness of breath. Nothing aggravates the symptoms. Nothing relieves the symptoms. She has tried nothing for the symptoms. The treatment provided no relief.  Patient with HTN presents with labile pressures and lightheadedness on her new BP medication.  No CP. No weakness.  No changes in vision and speech.      Past Medical History:  Diagnosis Date   Acute diastolic heart failure, NYHA class 1 (HCC) 02/08/2017   Acute heart failure (HCC) 02/08/2017   Cataract    OD   CHF (congestive heart failure) (HCC) 03/02/2017   EF 25-30% 2018   Complication of anesthesia    various issues with oxygen  saturations post op   COPD (chronic obstructive pulmonary disease) (HCC)    Depression    Depression    Phreesia 11/04/2020   Diverticulitis    DVT (deep venous thrombosis) (HCC)    Fibromyalgia    GERD (gastroesophageal reflux disease)    Heart murmur    Phreesia 11/04/2020   Hiatal hernia    History of deviated nasal septum    left- side   HTN (hypertension)    Hyperlipidemia    Hypertension    Phreesia 11/04/2020   Hypertensive retinopathy    OU   Myocardial infarction (HCC)    Phreesia 11/04/2020   Obesity    On supplemental oxygen therapy    concentrator at night @ 1.5 l/m or when sleeps. O2 Sat niormally 87.   Osteoporosis    Osteoporosis    Phreesia 11/04/2020   Oxygen deficiency    Phreesia 11/04/2020   PAT (paroxysmal atrial tachycardia)    PFO (patent foramen  ovale)    PULMONARY NODULE, LEFT LOWER LOBE 10/14/2009   5mm LLL nodule dec 2010. Stable and 4mm in Oct 2012. No further fu   PVD (peripheral vascular disease) with claudication 12/2017   Right middle lobe pneumonia 07/24/2011   First noted at admit 07/10/11. Persists on cxr 07/22/11. Cleared on CT 08/24/11. No further followup   Stroke [facility])    TOBACCO ABUSE 06/04/2009     Prior to Admission medications   Medication Sig Start Date End Date Taking? Authorizing Provider  acetaminophen  (TYLENOL ) 500 MG tablet Take 1,000 mg by mouth 2 (two) times daily as needed for moderate pain (pain score 4-6) or headache.    [provider]  clopidogrel  (PLAVIX ) 75 MG tablet Take 1 tablet (75 mg total) by mouth daily. 05/02/24   [place], Peter M, MD  desvenlafaxine  (PRISTIQ ) 25 MG 24 hr tablet Take 1 tablet (25 mg total) by mouth daily at 4 PM. 07/06/24   Arfeen, Leni DASEN, MD  diltiazem  (CARDIZEM  CD) 240 MG 24 hr capsule TAKE 1 CAPSULE BY MOUTH EVERY DAY 07/17/24   Camnitz, Soyla Lunger, MD  diltiazem  (CARDIZEM ) 30 MG tablet Take 1 tablet (30 mg total) by mouth every 8 (eight) hours as needed (Heart rate greater than 130). Patient taking differently: Take 30 mg by mouth as needed (Heart rate  greater than 130). 01/05/24   Lavona Agent, MD  Evolocumab  (REPATHA  SURECLICK) 140 MG/ML SOAJ INJECT 140MG  INTO THE SKIN EVERY 14 DAYS 06/26/24   [place], Peter M, MD  furosemide  (LASIX ) 40 MG tablet TAKE 1 TABLET BY MOUTH EVERY DAY 07/12/24   [place], Peter M, MD  gabapentin  (NEURONTIN ) 100 MG capsule Take 200 mg by mouth as needed (neuropathy). 09/16/16   [provider]  HYDROcodone -acetaminophen  (NORCO/VICODIN) 5-325 MG tablet Take 1 tablet by mouth 2 (two) times daily.    [provider]  ipratropium (ATROVENT ) 0.02 % nebulizer solution Take 2.5 mLs (0.5 mg total) by nebulization 4 (four) times daily. 02/21/24   [name] Amel, MD  metoprolol  tartrate (LOPRESSOR ) 25 MG tablet Take 1 tablet (25 mg total)  by mouth 2 (two) times daily. 02/08/24 07/06/24  [place], Peter M, MD  nitroGLYCERIN  (NITROSTAT ) 0.4 MG SL tablet PLACE 1 TABLET UNDER THE TONGUE EVERY 5 MINUTES AS NEEDED FOR CHEST PAIN. 12/21/23   [place], Peter M, MD  NON FORMULARY as needed [place]).    [provider]  OXYGEN Inhale 2 L/min into the lungs continuous.    [provider]  pantoprazole  (PROTONIX ) 40 MG tablet TAKE 1 TABLET BY MOUTH EVERY DAY 11/28/21   Levora Reyes SAUNDERS, MD  polyethylene glycol (MIRALAX  / GLYCOLAX ) 17 g packet Take 17 g by mouth every other day.    [provider]  spironolactone  (ALDACTONE ) 25 MG tablet TAKE 1/2 TABLET BY MOUTH EVERY DAY 01/31/24   [place], Peter M, MD  traZODone  (DESYREL ) 100 MG tablet Take one tab daily as needed for sleep 07/06/24   Arfeen, Leni DASEN, MD  warfarin (COUMADIN ) 5 MG tablet TAKE ONE-HALF TO 1 TABLET BY MOUTH DAILY OR AS DIRECTED BY CLINIC 03/28/24   [place], Peter M, MD    Allergies: Bee venom, Iodinated contrast media, Iodine , Sudafed [pseudoephedrine], Tessalon [benzonatate], Vueway  [gadopiclenol ], Xanax [alprazolam], Azithromycin , Budesonide -formoterol  fumarate, Crestor  [rosuvastatin  calcium ], Flonase [fluticasone propionate], Lamictal  [lamotrigine ], Lotrimin [clotrimazole], Lunesta [eszopiclone], Oxcarbazepine, Statins, Ambien [zolpidem tartrate], Betadine [povidone iodine ], Bevespi  aerosphere [glycopyrrolate -formoterol ], Biaxin  [clarithromycin ], Breztri  aerosphere [budeson-glycopyrrol-formoterol ], Claritin-d [loratadine-pseudoephedrine er], Effexor  [venlafaxine ], Lexapro  [escitalopram  oxalate], Nexium [esomeprazole magnesium ], Aciphex [rabeprazole sodium], Avelox [moxifloxacin hcl in nacl], Bentyl [dicyclomine], Bextra [valdecoxib], Ceclor [cefaclor], Covera-hs  [verapamil  hcl], Dicyclomine hcl, Estrace [estradiol], Fosamax  [alendronate  sodium], Keflex [cephalexin], and Other    Review of Systems  Constitutional:  Negative for fever.  HENT:  Negative for facial  swelling.   Respiratory:  Negative for shortness of breath.   Cardiovascular:  Negative for chest pain.  Gastrointestinal:  Negative for abdominal pain.  Neurological:  Positive for light-headedness. Negative for headaches.  All other systems reviewed and are negative.   Updated Vital Signs BP (!) 115/91   Pulse 78   Temp 97.7 F (36.5 C)   Resp 14   SpO2 97%   Physical Exam Vitals and nursing note reviewed.  Constitutional:      General: She is not in acute distress.    Appearance: Normal appearance. She is well-developed.  HENT:     Head: Normocephalic and atraumatic.     Nose: Nose normal.  Eyes:     Pupils: Pupils are equal, round, and reactive to light.  Cardiovascular:     Rate and Rhythm: Normal rate and regular rhythm.     Pulses: Normal pulses.     Heart sounds: Normal heart sounds.  Pulmonary:     Effort: Pulmonary effort is normal. No respiratory distress.     Breath sounds: Normal  breath sounds.  Abdominal:     General: Bowel sounds are normal. There is no distension.     Palpations: Abdomen is soft.     Tenderness: There is no abdominal tenderness. There is no guarding or rebound.  Musculoskeletal:        General: Normal range of motion.     Cervical back: Normal range of motion and neck supple.  Skin:    General: Skin is warm and dry.     Capillary Refill: Capillary refill takes less than 2 seconds.     Findings: No erythema or rash.  Neurological:     General: No focal deficit present.     Mental Status: She is alert and oriented to person, place, and time.     Deep Tendon Reflexes: Reflexes normal.  Psychiatric:        Mood and Affect: Mood normal.        Behavior: Behavior normal.     (all labs ordered are listed, but only abnormal results are displayed) Results for orders placed or performed during the hospital encounter of 08/21/24  Comprehensive metabolic panel   Collection Time: 08/21/24  6:22 PM  Result Value Ref Range   Sodium 135 135  - 145 mmol/L   Potassium 3.8 3.5 - 5.1 mmol/L   Chloride 96 (L) 98 - 111 mmol/L   CO2 26 22 - 32 mmol/L   Glucose, Bld 118 (H) 70 - 99 mg/dL   BUN 11 8 - 23 mg/dL   Creatinine, Ser 8.92 (H) 0.44 - 1.00 mg/dL   Calcium  10.1 8.9 - 10.3 mg/dL   Total Protein 6.9 6.5 - 8.1 g/dL   Albumin 4.3 3.5 - 5.0 g/dL   AST 22 15 - 41 U/L   ALT 13 0 - 44 U/L   Alkaline Phosphatase 70 38 - 126 U/L   Total Bilirubin 1.0 0.0 - 1.2 mg/dL   GFR, Estimated 53 (L) >60 mL/min   Anion gap 13 5 - 15  CBC   Collection Time: 08/21/24  6:22 PM  Result Value Ref Range   WBC 9.0 4.0 - 10.5 K/uL   RBC 4.78 3.87 - 5.11 MIL/uL   Hemoglobin 14.4 12.0 - 15.0 g/dL   HCT 56.2 63.9 - 53.9 %   MCV 91.4 80.0 - 100.0 fL   MCH 30.1 26.0 - 34.0 pg   MCHC 33.0 30.0 - 36.0 g/dL   RDW 86.0 88.4 - 84.4 %   Platelets 214 150 - 400 K/uL   nRBC 0.0 0.0 - 0.2 %  Urinalysis, Routine w reflex microscopic -Urine, Clean Catch   Collection Time: 08/21/24  6:22 PM  Result Value Ref Range   Color, Urine STRAW (A) YELLOW   APPearance CLEAR CLEAR   Specific Gravity, Urine 1.004 (L) 1.005 - 1.030   pH 7.0 5.0 - 8.0   Glucose, UA NEGATIVE NEGATIVE mg/dL   Hgb urine dipstick MODERATE (A) NEGATIVE   Bilirubin Urine NEGATIVE NEGATIVE   Ketones, ur NEGATIVE NEGATIVE mg/dL   Protein, ur NEGATIVE NEGATIVE mg/dL   Nitrite NEGATIVE NEGATIVE   Leukocytes,Ua TRACE (A) NEGATIVE   RBC / HPF 0-5 0 - 5 RBC/hpf   WBC, UA 0-5 0 - 5 WBC/hpf   Bacteria, UA NONE SEEN NONE SEEN   Squamous Epithelial / HPF 0-5 0 - 5 /HPF  Troponin I (High Sensitivity)   Collection Time: 08/21/24  6:22 PM  Result Value Ref Range   Troponin I (High Sensitivity) 8 <18 ng/L  CBG monitoring, ED   Collection Time: 08/21/24  6:43 PM  Result Value Ref Range   Glucose-Capillary 118 (H) 70 - 99 mg/dL  Troponin I (High Sensitivity)   Collection Time: 08/21/24  9:41 PM  Result Value Ref Range   Troponin I (High Sensitivity) 11 <18 ng/L   *Note: Due to a large number  of results and/or encounters for the requested time period, some results have not been displayed. A complete set of results can be found in Results Review.   DG Chest Portable 1 View Result Date: 08/22/2024 EXAM: 1 VIEW(S) XRAY OF THE CHEST 08/22/2024 03:17:23 AM COMPARISON: None available. CLINICAL HISTORY: AFIBetvc. FINDINGS: LUNGS AND PLEURA: Chronic coarsened interstitial markings. No focal pulmonary opacity. No pulmonary edema. No pleural effusion. No pneumothorax. HEART AND MEDIASTINUM: Atherosclerotic plaque of the aorta. No acute abnormality of the cardiac and mediastinal silhouettes. BONES AND SOFT TISSUES: No acute osseous abnormality. IMPRESSION: 1. No acute cardiopulmonary process. 2. Atherosclerotic plaque and emphysema. Electronically signed by: Morgane Naveau MD 08/22/2024 03:27 AM EDT RP Workstation: [id number]    EKG: EKG Interpretation Date/Time:  Monday August 21 2024 17:47:49 EDT Ventricular Rate:  109 PR Interval:    QRS Duration:  106 QT Interval:  356 QTC Calculation: 479 R Axis:   83  Text Interpretation: Atrial fibrillation with rapid ventricular response Nonspecific T wave abnormality Confirmed by Nettie, Kristopher Delk ([id number]) on 08/22/2024 2:30:55 AM  Radiology: DG Chest Portable 1 View Result Date: 08/22/2024 EXAM: 1 VIEW(S) XRAY OF THE CHEST 08/22/2024 03:17:23 AM COMPARISON: None available. CLINICAL HISTORY: AFIBetvc. FINDINGS: LUNGS AND PLEURA: Chronic coarsened interstitial markings. No focal pulmonary opacity. No pulmonary edema. No pleural effusion. No pneumothorax. HEART AND MEDIASTINUM: Atherosclerotic plaque of the aorta. No acute abnormality of the cardiac and mediastinal silhouettes. BONES AND SOFT TISSUES: No acute osseous abnormality. IMPRESSION: 1. No acute cardiopulmonary process. 2. Atherosclerotic plaque and emphysema. Electronically signed by: Morgane Naveau MD 08/22/2024 03:27 AM EDT RP Workstation: [id number]     Procedures   Medications Ordered  in the ED  warfarin (COUMADIN ) tablet 5 mg (5 mg Oral Not Given 08/21/24 1932)  Warfarin - Physician Dosing Inpatient (has no administration in time range)  metoprolol  tartrate (LOPRESSOR ) tablet 25 mg (25 mg Oral Given 08/21/24 1931)                                    Medical Decision Making Patient with HTN   Amount and/or Complexity of Data Reviewed Labs: ordered.    Details: Normal sodium 135, normal potassium 3.8, normal creatinine.  Normal white count 9, normal hemoglobin 14.4, normal platelets.   Radiology: ordered.    Details: Normal CXR ECG/medicine tests: ordered and independent interpretation performed. Decision-making details documented in ED Course. Discussion of management or test interpretation with external provider(s): 4:45 AM case d/w Dr. Gail of cardiology.  No change in medication at this time    Risk Risk Details: Normal exam and vitals in the ER.  Based on JNC 7 there is no indication for emergent lowering of this blood pressure.  Patient has 37 allergies and it is not prudent for me to change medication.  Have patient follow up with cardiology to discuss BP medication.  Stable for discharge with close follow up.  Strict returns     Final diagnoses:  Elevated blood pressure reading    No signs of systemic illness or infection. The patient is  nontoxic-appearing on exam and vital signs are within normal limits.  I have reviewed the triage vital signs and the nursing notes. Pertinent labs & imaging results that were available during my care of the patient were reviewed by me and considered in my medical decision making (see chart for details). After history, exam, and medical workup I feel the patient has been appropriately medically screened and is safe for discharge home. Pertinent diagnoses were discussed with the patient. Patient was given return precautions.  ED Discharge Orders     None       08/22/24 [id number]    Suzan Manon, MD 08/22/24 [id number]

## 2024-08-22 NOTE — ED Notes (Signed)
Pt name called to go back to see provider, no response.

## 2024-08-22 NOTE — Progress Notes (Signed)
 Cardiology Office Note    Date:  08/22/2024   ID:  Laurie, Horton 1946/07/15, MRN [id number]  PCP:  Verdia Lombard, MD  Cardiologist: Dr. Swaziland   Chief Complaint  Patient presents with   Atrial Fibrillation     History of Present Illness:    IVAH Horton is a 78 y.o. female with past medical history of chronic diastolic CHF, PFO, PAF (on Coumadin ), COPD (on 2L [place] at baseline), HTN, and prior CVA who is seen for follow up CHF and PAD.  She was admitted from 4/9 - 02/12/2017 for worsening dyspnea on exertion and palpitations. Was in atrial fibrillation with RVR upon arrival to the ED. Echo during admission showed a newly reduced EF of 20-25% and she was diuresed with IV Lasix . Enzymes were negative and EKG showed no acute ischemic changes. It was recommended to consider a right/left heart cath in 4-6 weeks.   She did undergo right and left heart cath on 03/04/17. This showed severe 2 vessel obstructive CAD with 100% RCA occlusion, 75% OM1, and 90% small OM2. EF 25-30%. Mild pulmonary HTN with normal LV filling pressures. 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 Maximizing CHF therapy recommended.  She did have repeat Echo in September 2018 showing improvement in EF to 50-55%.   Subsequent to this she developed significant claudication. She was seen by Dr. Court and had angiography showing 80% infrarenal aortic stenosis and left iliac stenosis. She also had severe right common femoral stenosis. She was seen by Dr Serene for consideration of Aortobifemoral bypass. After pulmonary evaluation she was felt to be too high a risk for open surgery. In March 2019 she underwent atherectomy and covered stenting of the left iliac by Dr. Court. On follow up she did have improvement in her claudication and ABIs. The aortic and right common femoral artery stenoses are not felt to be amenable to percutaneous therapy.   In early July 2019 she was seen because she felt she was in Afib  following dental procedure. On arrival she was in NSR with PACs. No medical changes made.   She has been followed by Dr Court. She was noted to have a high grade left subclavian stenosis but it was unclear that this was symptomatic and given all her medical problems was felt best to manage medically. She had left shoulder injection by Dr Anderson for adhesive capsulitis. She notes this has helped with her pain significantly. She is followed by pulmonary for COPD.   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 INR had been therapeutic. At time of infarct it was 1.9. We decided to add plavix  75 mg daily due to her extensive vascular disease. MRI showed no acute infarct but she did have evidence of multiple old strokes.   She had PNA in Dec/Jan. Followed by Dr Tonna for COPD exacerbation. Had CT done. Aortic size at 4.1 cm.    Patient presented to the hospital on 12/06/2021 with palpitations, chest pain, she was found to be in A-fib with RVR.  Serial troponin went from 9 --> 24 --> 36.  While in the ED, patient converted to sinus rhythm spontaneously. She was seen by the cardiology fellow who felt that she was not in CHF. Echocardiogram obtained on the same day showed EF 55 to 60%, no regional wall motion abnormality, mild LVH, mild LAE. She was discharged to follow-up with cardiology service as outpatient.  CT of the head obtained on 12/08/2021 was negative for acute intracranial abnormality.  Heart monitor obtained in February showed several episodes of A-fib and SVT, however A-fib burden was fairly low at 2%.  She was admitted 01/01/2024 in the setting of atrial fibrillation with RVR.  Cardiology was consulted.  She was transitioned to oral diltiazem .  She spontaneously converted to normal sinus rhythm.  Echocardiogram showed EF 65 to 70%, no RWMA, mild LVH, normal RV, mild LAE, trivial MR, aortic sclerosis without evidence of stenosis, mild  dilation of the ascending aorta measuring 41 mm. When seen back in the office she was in Atrial flutter with RVR. Continued on diltiazem  and Coreg  switched to metoprolol . Seen in Afib clinic and by Dr Inocencio. Noted in and out of Afib. Diltiazem  dose increased. Only AAD option would be Tikosyn and QTc is mildly prolonged.   Yesterday she was seen in the ED. Felt dizzy and noted BP up to 155 systolic. Called EMS and convinced her to go to ED. BP in ED was normal. She was in Afib with rate 109 on Ecg. Spent 15 hours in ED and sent home. Notes she is compliant with meds. Takes extra diltiazem  short acting if HR elevated which is what she did. Feels like she has been in Afib for 2 weeks. She complains of lightheadedness and fatigue.     Past Medical History:  Diagnosis Date   Acute diastolic heart failure, NYHA class 1 (HCC) 02/08/2017   Acute heart failure (HCC) 02/08/2017   Cataract    OD   CHF (congestive heart failure) (HCC) 03/02/2017   EF 25-30% 2018   Complication of anesthesia    various issues with oxygen  saturations post op   COPD (chronic obstructive pulmonary disease) (HCC)    Depression    Depression    Phreesia 11/04/2020   Diverticulitis    DVT (deep venous thrombosis) (HCC)    Fibromyalgia    GERD (gastroesophageal reflux disease)    Heart murmur    Phreesia 11/04/2020   Hiatal hernia    History of deviated nasal septum    left- side   HTN (hypertension)    Hyperlipidemia    Hypertension    Phreesia 11/04/2020   Hypertensive retinopathy    OU   Myocardial infarction (HCC)    Phreesia 11/04/2020   Obesity    On supplemental oxygen therapy    concentrator at night @ 1.5 l/m or when sleeps. O2 Sat niormally 87.   Osteoporosis    Osteoporosis    Phreesia 11/04/2020   Oxygen deficiency    Phreesia 11/04/2020   PAT (paroxysmal atrial tachycardia)    PFO (patent foramen ovale)    PULMONARY NODULE, LEFT LOWER LOBE 10/14/2009   5mm LLL nodule dec 2010. Stable and 4mm  in Oct 2012. No further fu   PVD (peripheral vascular disease) with claudication 12/2017   Right middle lobe pneumonia 07/24/2011   First noted at admit 07/10/11. Persists on cxr 07/22/11. Cleared on CT 08/24/11. No further followup   Stroke [facility])    TOBACCO ABUSE 06/04/2009    Past Surgical History:  Procedure Laterality Date   ABDOMINAL HYSTERECTOMY N/A    Phreesia 11/04/2020   CARDIOVASCULAR STRESS TEST  12/26/2004   EF 74%. NO EVIDENCE OF ISCHEMIA   CATARACT EXTRACTION Left    Dr. FABIENE Gaudy   ESOPHAGOGASTRODUODENOSCOPY (EGD) WITH PROPOFOL  N/A 04/15/2015   Procedure: ESOPHAGOGASTRODUODENOSCOPY (EGD) WITH PROPOFOL ;  Surgeon: Lynwood Bohr, MD;  Location: [facility];  Service: Endoscopy;  Laterality: N/A;   EYE SURGERY Left    Cat Sx   JOINT REPLACEMENT N/A    Phreesia 11/04/2020   KNEE ARTHROSCOPY  2000   left   LAPAROSCOPIC CHOLECYSTECTOMY  04-16-2010   [facility]   LOWER EXTREMITY ANGIOGRAPHY N/A 09/09/2017   Procedure: Lower Extremity Angiography;  Surgeon: Court Dorn PARAS, MD;  Location[facility] INVASIVE CV LAB;  Service: Cardiovascular;  Laterality: N/A;   LOWER EXTREMITY INTERVENTION Left 01/17/2018   Procedure: LOWER EXTREMITY INTERVENTION;  Surgeon: Court Dorn PARAS, MD;  Location: [facility] INVASIVE CV LAB;  Service: Cardiovascular;  Laterality: Left;   MOUTH SURGERY     03-26-15 multiple extractions stitches remains   PERIPHERAL VASCULAR INTERVENTION Left 01/17/2018   Procedure: PERIPHERAL VASCULAR INTERVENTION;  Surgeon: Court Dorn PARAS, MD;  Location: [facility] INVASIVE CV LAB;  Service: Cardiovascular;  Laterality: Left;  COMMON ILIAC   RIGHT/LEFT HEART CATH AND CORONARY ANGIOGRAPHY N/A 03/04/2017   Procedure: Right/Left Heart Cath and Coronary Angiography;  Surgeon: Mahogani Holohan M Swaziland, MD;  Location: [facility] INVASIVE CV LAB;  Service: Cardiovascular;  Laterality: N/A;   TOTAL ABDOMINAL HYSTERECTOMY     post op needed oxygen was told she gave them a scare   TUBAL LIGATION     US  ECHOCARDIOGRAPHY   11/20/2009   EF 55-60%    Current Medications: Outpatient Medications Prior to Visit  Medication Sig Dispense Refill   acetaminophen  (TYLENOL ) 500 MG tablet Take 1,000 mg by mouth 2 (two) times daily as needed for moderate pain (pain score 4-6) or headache.     clopidogrel  (PLAVIX ) 75 MG tablet Take 1 tablet (75 mg total) by mouth daily. 90 tablet 3   desvenlafaxine  (PRISTIQ ) 25 MG 24 hr tablet Take 1 tablet (25 mg total) by mouth daily at 4 PM. 30 tablet 2   diltiazem  (CARDIZEM  CD) 240 MG 24 hr capsule TAKE 1 CAPSULE BY MOUTH EVERY DAY 90 capsule 1   diltiazem  (CARDIZEM ) 30 MG tablet Take 1 tablet (30 mg total) by mouth every 8 (eight) hours as needed (Heart rate greater than 130). (Patient taking differently: Take 30 mg by mouth as needed (Heart rate greater than 130).) 90 tablet 2   Evolocumab  (REPATHA  SURECLICK) 140 MG/ML SOAJ INJECT 140MG  INTO THE SKIN EVERY 14 DAYS 6 mL 3   furosemide  (LASIX ) 40 MG tablet TAKE 1 TABLET BY MOUTH EVERY DAY 90 tablet 2   gabapentin  (NEURONTIN ) 100 MG capsule Take 200 mg by mouth as needed (neuropathy).     HYDROcodone -acetaminophen  (NORCO/VICODIN) 5-325 MG tablet Take 1 tablet by mouth 2 (two) times daily.     ipratropium (ATROVENT ) 0.02 % nebulizer solution Take 2.5 mLs (0.5 mg total) by nebulization 4 (four) times daily. 300 mL 11   metoprolol  tartrate (LOPRESSOR ) 25 MG tablet Take 1 tablet (25 mg total) by mouth 2 (two) times daily. 180 tablet 3   nitroGLYCERIN  (NITROSTAT ) 0.4 MG SL tablet PLACE 1 TABLET UNDER THE TONGUE EVERY 5 MINUTES AS NEEDED FOR CHEST PAIN. 25 tablet 7   NON FORMULARY as needed (Hempvana).     OXYGEN Inhale 2 L/min into the lungs continuous.     pantoprazole  (PROTONIX ) 40 MG tablet TAKE 1 TABLET BY MOUTH EVERY DAY 30 tablet 5   polyethylene glycol (MIRALAX  / GLYCOLAX ) 17 g packet Take 17 g by mouth every other day.     spironolactone  (ALDACTONE ) 25 MG tablet TAKE 1/2 TABLET BY MOUTH EVERY DAY 15 tablet 11   traZODone  (DESYREL ) 100  MG tablet Take one tab daily as needed for sleep 30 tablet 2   warfarin (COUMADIN ) 5 MG tablet TAKE ONE-HALF TO 1 TABLET BY MOUTH DAILY OR AS DIRECTED BY CLINIC 30 tablet 3   No facility-administered medications prior to visit.     Allergies:   Bee venom, Iodinated contrast media, Iodine , Sudafed [pseudoephedrine], Tessalon [benzonatate], Vueway  [gadopiclenol ], Xanax [alprazolam], Azithromycin , Budesonide -formoterol  fumarate, Crestor  [rosuvastatin  calcium ], Flonase [fluticasone propionate], Lamictal  [lamotrigine ], Lotrimin [clotrimazole], Lunesta [eszopiclone], Oxcarbazepine, Statins, Ambien [zolpidem tartrate], Betadine [povidone iodine ], Bevespi  aerosphere [glycopyrrolate -formoterol ], Biaxin  [clarithromycin ], Breztri  aerosphere [budeson-glycopyrrol-formoterol ], Claritin-d [loratadine-pseudoephedrine er], Effexor  Cornie.Copa ], Lexapro  [escitalopram  oxalate], Nexium [esomeprazole magnesium ], Aciphex [rabeprazole sodium], Avelox [moxifloxacin hcl in nacl], Bentyl [dicyclomine], Bextra [valdecoxib], Ceclor [cefaclor], Covera-hs  [verapamil  hcl], Dicyclomine hcl, Estrace [estradiol], Fosamax  [alendronate  sodium], Keflex [cephalexin], and Other   Social History   Socioeconomic History   Marital status: Divorced    Spouse name: Not on file   Number of children: 2   Years of education: Not on file   Highest education level: Not on file  Occupational History   Occupation: disability  Tobacco Use   Smoking status: Former    Current packs/day: 0.00    Average packs/day: 3.0 packs/day for 30.0 years (90.0 ttl pk-yrs)    Types: Cigarettes    Start date: 06/02/1980    Quit date: 06/02/2010    Years since quitting: 14.2   Smokeless tobacco: Never   Tobacco comments:    QUIT IN 2011  Vaping Use   Vaping status: Never Used  Substance and Sexual Activity   Alcohol use: No    Alcohol/week: 0.0 standard drinks of alcohol   Drug use: No   Sexual activity: Yes    Partners: Male    Birth  control/protection: Post-menopausal, Surgical  Other Topics Concern   Not on file  Social History Narrative   Marital status: divorced in 1978 after ten years; dating casually in 2019.      Children: 3 biological children; 3 court appointed children; 6 grandchildren; 5 gg      Lives: alone; children in [place] and [place].      Employment: retired age 67; disability for COPD, CVA at age 23.      Tobacco: former smoker; quit smoking 2011.      Alcohol: none      Exercise: walks dog four times per day; goes to pool three times per week.      ADLs: drives; no assistant devices; does have a walker.  Cleaning is limited in 2018.  Daughter helps with cleaning.  Does own grocery shopping.      Advanced Directives: YES; DNR/DNI; HCPOA: [employer]/daughter youngest.  Blind.               Social Drivers of Corporate investment banker Strain: Not on file  Food Insecurity: No Food Insecurity (01/02/2024)   Hunger Vital Sign    Worried About Running Out of Food in the Last Year: Never true    Ran Out of Food in the Last Year: Never true  Transportation Needs: No Transportation Needs (01/01/2024)   PRAPARE - Administrator, Civil Service (Medical): No    Lack of Transportation (Non-Medical): No  Physical Activity: Not on file  Stress: Not on file  Social Connections: Unknown (01/01/2024)   Social Connection and Isolation Panel    Frequency of Communication with Friends and Family: More than three times a week    Frequency of Social Gatherings with Friends and  Family: Patient declined    Attends Religious Services: Patient declined    Active Member of Clubs or Organizations: Patient declined    Attends Banker Meetings: Patient declined    Marital Status: Patient declined     Family History:  The patient's family history includes Alzheimer's disease in her mother; Dementia in her mother; Diabetes in her mother and sister; Heart attack in her father; Heart  attack (age of onset: 25) in her brother; Schizophrenia in her sister; Tremor in her sister.   Review of Systems:   As noted in HPI.  All other systems reviewed and are otherwise negative except as noted above.   Physical Exam:    VS:  BP 130/74   Pulse 98   Ht 5' 2 (1.575 m)   Wt 180 lb (81.6 kg)   SpO2 90%   BMI 32.92 kg/m    GENERAL:  Well appearing overweight WF in NAD HEENT:  PERRL, EOMI, sclera are clear. Oropharynx is clear. NECK:  No jugular venous distention, carotid upstroke brisk and symmetric, right carotid bruit,  left subclavian  bruit, no thyromegaly or adenopathy LUNGS:  Clear to auscultation bilaterally CHEST:  + scoliosis.  HEART:  IRRR,  PMI not displaced or sustained,S1 and S2 within normal limits, no S3, no S4: no clicks, no rubs, no murmurs.  ABD:  Soft, nontender. BS +, no masses or bruits. No hepatomegaly, no splenomegaly EXT:  Poor pedal pulses, absent left radial pulse.  no edema, no cyanosis no clubbing SKIN:  Warm and dry.  No rashes NEURO:  Alert and oriented x 3. Cranial nerves II through XII intact. PSYCH:  Cognitively intact    Wt Readings from Last 3 Encounters:  08/22/24 180 lb (81.6 kg)  06/08/24 187 lb (84.8 kg)  03/15/24 184 lb (83.5 kg)     Studies/Labs Reviewed:   Recent Labs: 01/01/2024: B Natriuretic Peptide 219.3; Magnesium  1.8; TSH 1.394 08/21/2024: ALT 13; BUN 11; Creatinine, Ser 1.07; Hemoglobin 14.4; Platelets 214; Potassium 3.8; Sodium 135   Lipid Panel    Component Value Date/Time   CHOL 124 01/17/2024 0839   TRIG 150 (H) 01/17/2024 0839   HDL 44 01/17/2024 0839   CHOLHDL 2.8 01/17/2024 0839   CHOLHDL 3 07/02/2021 1017   VLDL 44.0 (H) 07/02/2021 1017   LDLCALC 54 01/17/2024 0839   LDLDIRECT 67.0 07/02/2021 1017  Dated 11/20/21: cholesterol 142, HDL 42,  Dated 03/23/22:  A1c 6.3%. triglycerides122   Additional studies/ records that were reviewed today include:   Echocardiogram: 02/09/2017 Study Conclusions   -  Left ventricle: The cavity size was mildly dilated. Wall   thickness was increased in a pattern of mild LVH. Systolic   function was severely reduced. The estimated ejection fraction   was in the range of 20% to 25%. Diffuse hypokinesis. Doppler   parameters are consistent with abnormal left ventricular   relaxation (grade 1 diastolic dysfunction). - Aortic valve: Valve area (Vmax): 1.4 cm^2. - Aortic root: The aortic root was mildly dilated. - Ascending aorta: The ascending aorta was mildly dilated. - Mitral valve: Calcified annulus. There was moderate   regurgitation. - Left atrium: The atrium was moderately dilated. - Right ventricle: Systolic function was moderately reduced. - Pulmonary arteries: Systolic pressure was mildly increased. - Pericardium, extracardiac: A trivial pericardial effusion was   identified.   Impressions:   - No subcostal views; severe global reduction in LV systolic   function; grade 1 diastolic dysfunction; mildly dilated aortic  root and ascending aorta; moderate MR; moderaet LAE; moderately   reduced RV function; mild TR; mildly elevated pulmonary pressure.  Procedures   Right/Left Heart Cath and Coronary Angiography  Conclusion     Ost 1st Mrg to 1st Mrg lesion, 75 %stenosed. 2nd Mrg lesion, 90 %stenosed. Ost RCA to Mid RCA lesion, 100 %stenosed. There is severe left ventricular systolic dysfunction. LV end diastolic pressure is normal. The left ventricular ejection fraction is 25-35% by visual estimate. Hemodynamic findings consistent with mild pulmonary hypertension. LV end diastolic pressure is normal.   1. Severe 2 vessel obstructive CAD    - 75% proximal OM1    - 90% small OM2    - 100% proximal RCA. Left to right collaterals.  2. Severe LV dysfunction 3. Mild pulmonary HTN with normal LV filling pressures.  4. Cardiac index 2.41 L/min/BSA     Plan: Medical management to try and optimize CHF therapy. Patient appears to be adequately  diuresed at this time. Based on these results her cardiomyopathy is ischemic. I would treat her CAD medically. If cardiac cath is needed in the future would consider alternative access given difficulty from the right radial approach. Her rhythm during procedure is a multifocal atrial rhythm/tachycardia.      Echo 07/08/17: Study Conclusions   - Left ventricle: The cavity size was normal. There was moderate   concentric hypertrophy. Systolic function was normal. The   estimated ejection fraction was in the range of 50% to 55%.   Severe hypokinesis of the basal-midinferior myocardium;   consistent with infarction in the distribution of the right   coronary artery. Doppler parameters are consistent with abnormal   left ventricular relaxation (grade 1 diastolic dysfunction). - Mitral valve: Calcified annulus.   Impressions:   - Compared to April 2018 there is marked improvement in contraction   of all LV wall segemnts except for the inferior wall, which   remains severely hypokinetic. There is also marked reduction in   the severity of mitral insufficiency.    Echo 12/06/2021  1. Left ventricular ejection fraction, by estimation, is 55 to 60%. The  left ventricle has normal function. The left ventricle has no regional  wall motion abnormalities. There is mild left ventricular hypertrophy.  Left ventricular diastolic parameters  were normal.   2. Right ventricular systolic function is normal. The right ventricular  size is normal.   3. Left atrial size was mildly dilated.   4. The mitral valve is normal in structure. No evidence of mitral valve  regurgitation. No evidence of mitral stenosis.   5. The aortic valve is tricuspid. There is mild calcification of the  aortic valve. There is mild thickening of the aortic valve. Aortic valve  regurgitation is not visualized. Aortic valve sclerosis is present, with  no evidence of aortic valve stenosis.   6. The inferior vena cava is normal in  size with greater than 50%  respiratory variability, suggesting right atrial pressure of 3 mmHg.     Monitor 01/02/22: Study Highlights    Normal sinus rhythm Atrial fibrillation with RVR on 12/19/21 and 12/21/21 with longest episode lasting 5 hours and 40 minutes and average HR 120 bpm. AFib burden 2% Frequent PACs with few short bursts of SVT longest lasting 15 beats Occasional PVCs with rare NSVT longest lasting 5 beats. Symptoms appear to correlate with Afib an SVT.     Patch Wear Time:  13 days and 23 hours (2023-02-10T10:12:14-0500 to 2023-02-24T09:21:34-0500)   Patient had  a min HR of 43 bpm, max HR of 235 bpm, and avg HR of 71 bpm. Predominant underlying rhythm was Sinus Rhythm. First Degree AV Block was present. 2 Ventricular Tachycardia runs occurred, the run with the fastest interval lasting 5 beats with a  max rate of 235 bpm (avg 156 bpm); the run with the fastest interval was also the longest. 7 Supraventricular Tachycardia runs occurred, the run with the fastest interval lasting 4 beats with a max rate of 176 bpm, the longest lasting 15 beats with an  avg rate of 128 bpm. Some episodes of Supraventricular Tachycardia may be possible Atrial Tachycardia with variable block. Atrial Fibrillation occurred (2% burden), ranging from 75-167 bpm (avg of 120 bpm), the longest lasting 5 hours 40 mins with an avg  rate of 116 bpm. Supraventricular Tachycardia and Atrial Fibrillation were detected within +/- 45 seconds of symptomatic patient event(s). Isolated SVEs were frequent (11.5%, [id number]), SVE Couplets were frequent (5.5%, [id number]), and SVE Triplets were  occasional (1.7%, 8129). Isolated VEs were occasional (4.5%, [id number]), VE Couplets were rare (<1.0%, 596), and VE Triplets were rare (<1.0%, 41). Ventricular Bigeminy and Trigeminy were present.   Echo 01/02/24: IMPRESSIONS     1. Left ventricular ejection fraction, by estimation, is 65 to 70%. The  left ventricle has normal function. The  left ventricle has no regional  wall motion abnormalities. There is mild left ventricular hypertrophy.  Left ventricular diastolic function  could not be evaluated.   2. Right ventricular systolic function is normal. The right ventricular  size is normal.   3. Left atrial size was mildly dilated.   4. The mitral valve is abnormal. Trivial mitral valve regurgitation.   5. The aortic valve is tricuspid. Aortic valve regurgitation is not  visualized. Aortic valve sclerosis/calcification is present, without any  evidence of aortic stenosis.   6. Aortic dilatation noted. There is mild dilatation of the ascending  aorta, measuring 41 mm.   7. The inferior vena cava is normal in size with greater than 50%  respiratory variability, suggesting right atrial pressure of 3 mmHg.   Comparison(s): Changes from prior study are noted. 12/06/2021: LVEF 55-60%,  mild LAE.   Assessment:    No diagnosis found.          Plan:    1. Chronic Combined Systolic and Diastolic CHF/  Secondary to ischemic/tachycardia mediated Cardiomyopathy - EF 25-30% in 2018 with ischemic cardiomyopathy. Not a candidate for revascularization. With medical management EF improved.  Last Echo in March with EF 65-70% - she is euvolemic at this time. Weight is stable.   - continue BB, aldactone , lasix  40 mg daily and statin. ARB discontinued due to orthostatic dizziness.   2. Paroxysmal Atrial Fibrillation/ multifocal atrial tachycardia. Now recurrent - This patients CHA2DS2-VASc Score and unadjusted Ischemic Stroke Rate (% per year) is equal to 9.7 % stroke rate/year from a score of 6 (CHF, HTN, Female, Age, CVA (2)). She denies any evidence of active bleeding. Continue Coumadin  for anticoagulation. On metoprolol  and diltiazem  for rate control. She is really limited in options for AAD therapy due to CHF, CAD and severe lung disease. Only option would be Tikosyn but she does not want to be in hospital for 3 days to initiate.  She has appt with Afib clinic in 3 weeks to discuss. Really little else to offer.   3. HTN- controlled.  4. COPD/Emphysema- - followed by Pulmonology. On oxygen  5. PFO - no plans for closure  6. PAD s/p covered stenting of left iliac. Stenosis in distal aorta and right femoral not amenable to percutaneous therapy. Left subclavian stenosis. Claudication is stable.   7. CAD. 100% RCA. 75% OM. Chronic stable angina class 1-2.  No active angina. Rarely uses Ntg.   8. S/p right retinal artery occlusion. Plavix  added to coumadin  due to severe vascular disease. I feel benefit of Plavix /coumadin  combination outweighs risk of bleeding at this time.  9. 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 Followed by Dr Rosemarie  10. Hypercholesterolemia. Now on Repatha . Excellent result with LDL 52  11. Carotid arterial disease. No new Neurologic symptoms. Dopplers in January 2023 were unchanged.   12. 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 Reassured her about this. She is not a candidate for aortic surgery   I will follow up in 3 months   Signed, Freyja Govea [place], MD  08/22/2024 2:19 PM    [facility] [address] [place], [place] [address] Phone: [phone]

## 2024-08-22 NOTE — Patient Instructions (Addendum)
 Medication Instructions:  Continue same medications *If you need a refill on your cardiac medications before your next appointment, please call your pharmacy*  Lab Work: None ordered  Testing/Procedures: None ordered  Follow-Up: At [facility], you and your health needs are our priority.  As part of our continuing mission to provide you with exceptional heart care, our providers are all part of one team.  This team includes your primary Cardiologist (physician) and Advanced Practice Providers or APPs (Physician Assistants and Nurse Practitioners) who all work together to provide you with the care you need, when you need it.  Your next appointment:  3 months   Fri 1/16 at 1:20 pm    Provider:  Dr.Jordan   We recommend signing up for the patient portal called MyChart.  Sign up information is provided on this After Visit Summary.  MyChart is used to connect with patients for Virtual Visits (Telemedicine).  Patients are able to view lab/test results, encounter notes, upcoming appointments, etc.  Non-urgent messages can be sent to your provider as well.   To learn more about what you can do with MyChart, go to [email or web address].

## 2024-08-23 NOTE — Progress Notes (Unsigned)
 OV 08/23/2024  Subjective:  Patient ID: Laurie Horton, female , DOB: 27-Feb-1946 , age 78 y.o. , MRN: [id number] , ADDRESS: [address] [place] [address] PCP Verdia Lombard, MD Patient Care Team: Verdia Lombard, MD as PCP - General (Internal Medicine) [place], Peter M, MD as PCP - Cardiology (Cardiology) Inocencio Soyla Lunger, MD as PCP - Electrophysiology (Cardiology) Cozetta Back, MD as Referring Physician (Physical Medicine and Rehabilitation) Celestia Agent, MD (Inactive) as Consulting Physician (Gastroenterology) Rosemarie Eather RAMAN, MD as Consulting Physician (Neurology) [place], Peter M, MD as Consulting Physician (Cardiology) [name] Amel, MD as Consulting Physician (Pulmonary Disease) Curry Leni DASEN, MD as Consulting Physician (Psychiatry)  This Provider for this visit: Treatment Team:  Attending Provider: [name] Amel, MD    08/23/2024 -  No chief complaint on file.    HPI Laurie Horton 78 y.o. -    CT Chest data from date: ****  - personally visualized and independently interpreted : *** - my findings are: ***   PFT     Latest Ref Rng & Units 01/30/2021    8:51 AM 01/20/2016    9:35 AM 08/14/2013   10:17 AM  PFT Results  FVC-Pre L 2.61  2.79    FVC-Predicted Pre % 92  93  100   FVC-Post L   3.07   FVC-Predicted Post %   103   Pre FEV1/FVC % % 63  67  62   Post FEV1/FCV % %   64   FEV1-Pre L 1.63  1.86  1.84   FEV1-Predicted Pre % 77  82  82   FEV1-Post L   1.97   DLCO uncorrected ml/min/mmHg 9.45  9.75  11.57   DLCO UNC% % 49  40  50   DLCO corrected ml/min/mmHg 9.45     DLCO COR %Predicted % 49     DLVA Predicted % 57  51  58   TLC L 4.59  4.62  4.32   TLC % Predicted % 90  91  88   RV % Predicted % 80  78  77        LAB RESULTS last 96 hours DG Chest Portable 1 View Result Date: 08/22/2024 EXAM: 1 VIEW(S) XRAY OF THE CHEST 08/22/2024 03:17:23 AM COMPARISON: None available. CLINICAL HISTORY:  AFIBetvc. FINDINGS: LUNGS AND PLEURA: Chronic coarsened interstitial markings. No focal pulmonary opacity. No pulmonary edema. No pleural effusion. No pneumothorax. HEART AND MEDIASTINUM: Atherosclerotic plaque of the aorta. No acute abnormality of the cardiac and mediastinal silhouettes. BONES AND SOFT TISSUES: No acute osseous abnormality. IMPRESSION: 1. No acute cardiopulmonary process. 2. Atherosclerotic plaque and emphysema. Electronically signed by: Morgane Naveau MD 08/22/2024 03:27 AM EDT RP Workstation: [id number]         has a past medical history of Acute diastolic heart failure, NYHA class 1 (HCC) (02/08/2017), Acute heart failure (HCC) (02/08/2017), Cataract, CHF (congestive heart failure) (HCC) (03/02/2017), Complication of anesthesia, COPD (chronic obstructive pulmonary disease) (HCC), Depression, Depression, Diverticulitis, DVT (deep venous thrombosis) (HCC), Fibromyalgia, GERD (gastroesophageal reflux disease), Heart murmur, Hiatal hernia, History of deviated nasal septum, HTN (hypertension), Hyperlipidemia, Hypertension, Hypertensive retinopathy, Myocardial infarction (HCC), Obesity, On supplemental oxygen therapy, Osteoporosis, Osteoporosis, Oxygen deficiency, PAT (paroxysmal atrial tachycardia), PFO (patent foramen ovale), PULMONARY NODULE, LEFT LOWER LOBE (10/14/2009), PVD (peripheral vascular disease) with claudication (12/2017), Right middle lobe pneumonia (07/24/2011), Stroke (HCC), and TOBACCO ABUSE (06/04/2009).   reports that she quit smoking about 14 years ago. Her smoking  use included cigarettes. She started smoking about 44 years ago. She has a 90 pack-year smoking history. She has never used smokeless tobacco.  Past Surgical History:  Procedure Laterality Date   ABDOMINAL HYSTERECTOMY N/A    Phreesia 11/04/2020   CARDIOVASCULAR STRESS TEST  12/26/2004   EF 74%. NO EVIDENCE OF ISCHEMIA   CATARACT EXTRACTION Left    Dr. FABIENE Gaudy   ESOPHAGOGASTRODUODENOSCOPY (EGD) WITH PROPOFOL   N/A 04/15/2015   Procedure: ESOPHAGOGASTRODUODENOSCOPY (EGD) WITH PROPOFOL ;  Surgeon: Lynwood Bohr, MD;  Location: [facility];  Service: Endoscopy;  Laterality: N/A;   EYE SURGERY Left    Cat Sx   JOINT REPLACEMENT N/A    Phreesia 11/04/2020   KNEE ARTHROSCOPY  2000   left   LAPAROSCOPIC CHOLECYSTECTOMY  04-16-2010   [facility]   LOWER EXTREMITY ANGIOGRAPHY N/A 09/09/2017   Procedure: Lower Extremity Angiography;  Surgeon: Court Dorn PARAS, MD;  Location: [facility] INVASIVE CV LAB;  Service: Cardiovascular;  Laterality: N/A;   LOWER EXTREMITY INTERVENTION Left 01/17/2018   Procedure: LOWER EXTREMITY INTERVENTION;  Surgeon: Court Dorn PARAS, MD;  Location: [facility] INVASIVE CV LAB;  Service: Cardiovascular;  Laterality: Left;   MOUTH SURGERY     03-26-15 multiple extractions stitches remains   PERIPHERAL VASCULAR INTERVENTION Left 01/17/2018   Procedure: PERIPHERAL VASCULAR INTERVENTION;  Surgeon: Court Dorn PARAS, MD;  Location: [facility] INVASIVE CV LAB;  Service: Cardiovascular;  Laterality: Left;  COMMON ILIAC   RIGHT/LEFT HEART CATH AND CORONARY ANGIOGRAPHY N/A 03/04/2017   Procedure: Right/Left Heart Cath and Coronary Angiography;  Surgeon: Peter M [place], MD;  Location: [facility] INVASIVE CV LAB;  Service: Cardiovascular;  Laterality: N/A;   TOTAL ABDOMINAL HYSTERECTOMY     post op needed oxygen was told she gave them a scare   TUBAL LIGATION     US  ECHOCARDIOGRAPHY  11/20/2009   EF 55-60%    Allergies  Allergen Reactions   Bee Venom Anaphylaxis   Iodinated Contrast Media Swelling and Other (See Comments)    Throat swelling, stomach ache, tremors   Iodine  Anaphylaxis and Swelling    Throat swelling   Sudafed [Pseudoephedrine] Shortness Of Breath and Palpitations   Tessalon [Benzonatate] Rash   Vueway  [Gadopiclenol ] Shortness Of Breath, Anxiety and Rash    03/14/24:pt developed SOB and chest tightness after receiving IV MRI contrast. Also broke out in rash on upper arms. Pt will need to be done in hospital  in the future for MRI W   Xanax [Alprazolam] Anaphylaxis and Other (See Comments)    Respiratory arrest   Azithromycin  Rash    Broke out in a rash   Budesonide -Formoterol  Fumarate Other (See Comments)    Blisters inside of mouth all over   Crestor  [Rosuvastatin  Calcium ] Other (See Comments)    Severe leg weakness Unable to walk   Flonase [Fluticasone Propionate] Other (See Comments)    Epistaxis    Lamictal  [Lamotrigine ] Diarrhea, Rash and Other (See Comments)    Difficulty breathing   Lotrimin [Clotrimazole] Other (See Comments)    Mouth blisters   Lunesta [Eszopiclone] Other (See Comments)    REACTION: slept for a week   Oxcarbazepine Other (See Comments)    Causes deep sleep and dizziness   Statins Other (See Comments)    Severe leg weakness Unable to walk   Ambien [Zolpidem Tartrate] Other (See Comments)    Slept for a week   Betadine [Povidone Iodine ] Other (See Comments)    Breathing problems   Bevespi  Aerosphere [Glycopyrrolate -Formoterol ] Other (See Comments)  Pt believes this caused mouth sores and thrush    Biaxin  [Clarithromycin ] Other (See Comments)    All mycins, Puts into a fib, Will take if has to for severe sinus infection   Breztri  Aerosphere [Budeson-Glycopyrrol-Formoterol ] Other (See Comments)    Thrush   Claritin-D [Loratadine-Pseudoephedrine Er] Other (See Comments)    Tremors, shaking   Effexor  [Venlafaxine ] Nausea And Vomiting and Other (See Comments)    cramps   Lexapro  [Escitalopram  Oxalate] Other (See Comments)    Hallucinations    Nexium [Esomeprazole Magnesium ] Other (See Comments)    Hyperactivity    Aciphex [Rabeprazole Sodium] Rash   Avelox [Moxifloxacin Hcl In Nacl] Other (See Comments)    Stomach cramps   Bentyl [Dicyclomine] Rash   Bextra [Valdecoxib] Rash   Ceclor [Cefaclor] Rash   Covera-Hs  [Verapamil  Hcl] Palpitations   Dicyclomine Hcl Rash   Estrace [Estradiol] Other (See Comments)    Breast soreness (severe).    Fosamax  [Alendronate  Sodium] Other (See Comments)    Stomach issues   Keflex [Cephalexin] Rash and Other (See Comments)    Pt states that she is possibly allergic to this - had a reaction to Cefaclor in the past and she does not want to these class drugs. Added per patient request.   Other Other (See Comments)    Glue from ekg/heart monitor leads --rash, Any MYCINS    Immunization History  Administered Date(s) Administered   Influenza Split 08/03/2011, 08/01/2012   Influenza,inj,Quad PF,6+ Mos 08/14/2013, 09/03/2014, 09/04/2015, 08/18/2016   Influenza-Unspecified 11/03/1999   Pneumococcal Conjugate-13 10/01/2014   Pneumococcal Polysaccharide-23 09/03/1999, 06/02/2006, 08/14/2013   Td 11/02/1994   Tdap 08/18/2016    Family History  Problem Relation Age of Onset   Dementia Mother    Diabetes Mother    Alzheimer's disease Mother    Heart attack Brother 50   Heart attack Father    Schizophrenia Sister    Diabetes Sister    Tremor Sister      Current Outpatient Medications:    acetaminophen  (TYLENOL ) 500 MG tablet, Take 1,000 mg by mouth 2 (two) times daily as needed for moderate pain (pain score 4-6) or headache., Disp: , Rfl:    clopidogrel  (PLAVIX ) 75 MG tablet, Take 1 tablet (75 mg total) by mouth daily., Disp: 90 tablet, Rfl: 3   desvenlafaxine  (PRISTIQ ) 25 MG 24 hr tablet, Take 1 tablet (25 mg total) by mouth daily at 4 PM., Disp: 30 tablet, Rfl: 2   diltiazem  (CARDIZEM  CD) 240 MG 24 hr capsule, TAKE 1 CAPSULE BY MOUTH EVERY DAY, Disp: 90 capsule, Rfl: 1   diltiazem  (CARDIZEM ) 30 MG tablet, Take 1 tablet (30 mg total) by mouth every 8 (eight) hours as needed (Heart rate greater than 130). (Patient taking differently: Take 30 mg by mouth as needed (Heart rate greater than 130).), Disp: 90 tablet, Rfl: 2   Evolocumab  (REPATHA  SURECLICK) 140 MG/ML SOAJ, INJECT 140MG  INTO THE SKIN EVERY 14 DAYS, Disp: 6 mL, Rfl: 3   furosemide  (LASIX ) 40 MG tablet, TAKE 1 TABLET BY MOUTH EVERY  DAY, Disp: 90 tablet, Rfl: 2   gabapentin  (NEURONTIN ) 100 MG capsule, Take 200 mg by mouth as needed (neuropathy)., Disp: , Rfl:    HYDROcodone -acetaminophen  (NORCO/VICODIN) 5-325 MG tablet, Take 1 tablet by mouth 2 (two) times daily., Disp: , Rfl:    ipratropium (ATROVENT ) 0.02 % nebulizer solution, Take 2.5 mLs (0.5 mg total) by nebulization 4 (four) times daily., Disp: 300 mL, Rfl: 11   metoprolol  tartrate (LOPRESSOR ) 25  MG tablet, Take 1 tablet (25 mg total) by mouth 2 (two) times daily., Disp: 180 tablet, Rfl: 3   nitroGLYCERIN  (NITROSTAT ) 0.4 MG SL tablet, PLACE 1 TABLET UNDER THE TONGUE EVERY 5 MINUTES AS NEEDED FOR CHEST PAIN., Disp: 25 tablet, Rfl: 7   NON FORMULARY, as needed ([employer])., Disp: , Rfl:    OXYGEN, Inhale 2 L/min into the lungs continuous., Disp: , Rfl:    pantoprazole  (PROTONIX ) 40 MG tablet, TAKE 1 TABLET BY MOUTH EVERY DAY, Disp: 30 tablet, Rfl: 5   polyethylene glycol (MIRALAX  / GLYCOLAX ) 17 g packet, Take 17 g by mouth every other day., Disp: , Rfl:    spironolactone  (ALDACTONE ) 25 MG tablet, TAKE 1/2 TABLET BY MOUTH EVERY DAY, Disp: 15 tablet, Rfl: 11   traZODone  (DESYREL ) 100 MG tablet, Take one tab daily as needed for sleep, Disp: 30 tablet, Rfl: 2   warfarin (COUMADIN ) 5 MG tablet, TAKE ONE-HALF TO 1 TABLET BY MOUTH DAILY OR AS DIRECTED BY CLINIC, Disp: 30 tablet, Rfl: 3      Objective:   There were no vitals filed for this visit.  Estimated body mass index is 32.92 kg/m as calculated from the following:   Height as of 08/22/24: 5' 2 (1.575 m).   Weight as of 08/22/24: 180 lb (81.6 kg).  @WEIGHTCHANGE @  There were no vitals filed for this visit.   Physical Exam   General: No distress. *** O2 at rest: *** Cane present: *** Sitting in wheel chair: *** Frail: *** Obese: *** Neuro: Alert and Oriented x 3. GCS 15. Speech normal Psych: Pleasant Resp:  Barrel Chest - ***.  Wheeze - ***, Crackles - ***, No overt respiratory distress CVS: Normal heart  sounds. Murmurs - *** Ext: Stigmata of Connective Tissue Disease - *** HEENT: Normal upper airway. PEERL +. No post nasal drip        Assessment/     Assessment & Plan Chronic respiratory failure with hypoxia (HCC)    PLAN There are no Patient Instructions on file for this visit.    FOLLOWUP    No follow-ups on file.    SIGNATURE    Dr. Dorethia Cave, M.D., F.C.C.P,  Pulmonary and Critical Care Medicine Staff Physician, [facility] Director - Interstitial Lung Disease  Program  Pulmonary Fibrosis [facility] at [facility] [place], [place], [address]  Pager: [phone], If no answer or between  15:00h - 7:00h: call [phone] Telephone: [phone]  2:35 PM 08/23/2024   Moderate Complexity MDM OFFICE  2021 E/M guidelines, first released in 2021, with minor revisions added in 2023 and 2024 Must meet the requirements for 2 out of 3 dimensions to qualify.    Number and complexity of problems addressed Amount and/or complexity of data reviewed Risk of complications and/or morbidity  One or more chronic illness with mild exacerbation, OR progression, OR  side effects of treatment  Two or more stable chronic illnesses  One undiagnosed new problem with uncertain prognosis  One acute illness with systemic symptoms   One Acute complicated injury Must meet the requirements for 1 of 3 of the categories)  Category 1: Tests and documents, historian  Any combination of 3 of the following:  Assessment requiring an independent historian  Review of prior external note(s) from each unique source  Review of results of each unique test  Ordering of each unique test    Category 2: Interpretation of tests  Independent interpretation of a test performed by another physician/other qualified health care professional (not separately reported)  Category 3: Discuss management/tests  Discussion of management or test  interpretation with external physician/other qualified health care professional/appropriate source (not separately reported) Moderate risk of morbidity from additional diagnostic testing or treatment Examples only:  Prescription drug management  Decision regarding minor surgery with identfied patient or procedure risk factors  Decision regarding elective major surgery without identified patient or procedure risk factors  Diagnosis or treatment significantly limited by social determinants of health             HIGh Complexity  OFFICE   2021 E/M guidelines, first released in 2021, with minor revisions added in 2023. Must meet the requirements for 2 out of 3 dimensions to qualify.    Number and complexity of problems addressed Amount and/or complexity of data reviewed Risk of complications and/or morbidity  Severe exacerbation of chronic illness  Acute or chronic illnesses that may pose a threat to life or bodily function, e.g., multiple trauma, acute MI, pulmonary embolus, severe respiratory distress, progressive rheumatoid arthritis, psychiatric illness with potential threat to self or others, peritonitis, acute renal failure, abrupt change in neurological status Must meet the requirements for 2 of 3 of the categories)  Category 1: Tests and documents, historian  Any combination of 3 of the following:  Assessment requiring an independent historian  Review of prior external note(s) from each unique source  Review of results of each unique test  Ordering of each unique test    Category 2: Interpretation of tests    Independent interpretation of a test performed by another physician/other qualified health care professional (not separately reported)  Category 3: Discuss management/tests  Discussion of management or test interpretation with external physician/other qualified health care professional/appropriate source (not separately reported)  HIGH risk of morbidity  from additional diagnostic testing or treatment Examples only:  Drug therapy requiring intensive monitoring for toxicity  Decision for elective major surgery with identified pateint or procedure risk factors  Decision regarding hospitalization or escalation of level of care  Decision for DNR or to de-escalate care   Parenteral controlled  substances            LEGEND - Independent interpretation involves the interpretation of a test for which there is a CPT code, and an interpretation or report is customary. When a review and interpretation of a test is performed and documented by the provider, but not separately reported (billed), then this would represent an independent interpretation. This report does not need to conform to the usual standards of a complete report of the test. This does not include interpretation of tests that do not have formal reports such as a complete blood count with differential and blood cultures. Examples would include reviewing a chest radiograph and documenting in the medical record an interpretation, but not separately reporting (billing) the interpretation of the chest radiograph.   An appropriate source includes professionals who are not health care professionals but may be involved in the management of the patient, such as a Clinical research associate, upper officer, case manager or teacher, and does not include discussion with family or informal caregivers.    - SDOH: SDOH are the conditions in the environments where people are born, live, learn, work, play, worship, and age that affect a wide range of health, functioning, and quality-of-life outcomes and risks. (e.g., housing, food insecurity, transportation, etc.). SDOH-related Z codes ranging from Z55-Z65 are the ICD-10-CM diagnosis codes used to  document SDOH data Z69 - Problems related to education and literacy Z56 - Problems related to employment and unemployment Z57 - Occupational exposure to risk  factors Z58 - Problems related to physical environment Z59 - Problems related to housing and economic circumstances [phone] - Problems related to social environment [phone] - Problems related to upbringing [phone] - Other problems related to primary support group, including family circumstances Z66 - Problems related to certain psychosocial circumstances Z65 - Problems related to other psychosocial circumstances

## 2024-08-24 ENCOUNTER — Ambulatory Visit (INDEPENDENT_AMBULATORY_CARE_PROVIDER_SITE_OTHER): Admitting: Internal Medicine

## 2024-08-24 ENCOUNTER — Encounter: Payer: Self-pay | Admitting: Internal Medicine

## 2024-08-24 VITALS — BP 118/76 | HR 80 | Temp 98.8°F | Ht 63.0 in | Wt 181.6 lb

## 2024-08-24 DIAGNOSIS — Z889 Allergy status to unspecified drugs, medicaments and biological substances status: Secondary | ICD-10-CM

## 2024-08-24 DIAGNOSIS — J441 Chronic obstructive pulmonary disease with (acute) exacerbation: Secondary | ICD-10-CM

## 2024-08-24 DIAGNOSIS — Z7185 Encounter for immunization safety counseling: Secondary | ICD-10-CM

## 2024-08-24 DIAGNOSIS — J9611 Chronic respiratory failure with hypoxia: Secondary | ICD-10-CM | POA: Diagnosis not present

## 2024-08-24 MED ORDER — PREDNISONE 10 MG PO TABS: ORAL_TABLET | ORAL | 0 refills | Status: DC

## 2024-08-24 MED ORDER — CLARITHROMYCIN 250 MG PO TABS: 500.0000 mg | ORAL_TABLET | Freq: Two times a day (BID) | ORAL | 0 refills | Status: DC

## 2024-08-24 NOTE — Patient Instructions (Addendum)
 ICD-10-CM   1. Chronic respiratory failure with hypoxia (HCC)  J96.11     2. 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 Multiple drug allergies  Z88.9     4. Screening for lung cancer  Z12.2      Too bad now having A Fib issues. REgarding lungs   Chronic respiratory failure with hypoxia (HCC) Emphysema COPD flare up  -likely low grade flare up - lot of emotional stressors now  Plan - Please take prednisone  40 mg x1 day, then 30 mg x1 day, then 20 mg x1 day, then 10 mg x1 day, and then 5 mg x1 day and stop - Biaxon 500mg  twice daily x 10 days - Continue ipratropium 3 mL every 6 hours as needed for shortness of breath or wheezing. Use twice daily for maintenance    - send prescriptions - - Continue supplemental oxygen 2-3 lpm with activity and at night. Goal oxygen >88-90%  - Continue Mucinex 600 mg Twice daily for chest congestion/cough - at follwup discuss Othuvayre (currently cost concetn)  Vaccine counseling Multiple drug allergies  -Respect hesitancy towards COVID, RSV and flu vaccine  Plan - Monitor clinically   Screening for lung cancer  -No evidence of pneumonia on CT scan of the chest October 2024.  No lung nodules no lung cancer.  No pulmonary fibrosis  - -No further CT scans per USPTF [place]) after age 79 for lung cancer screening though [employer] wants you to have yearly scans till age 22  plan - no futher CT scan for this based on shared decision making -Smoking to remain in remission     Follow up -6 months or [place] if needed

## 2024-08-30 ENCOUNTER — Telehealth: Payer: Self-pay | Admitting: *Deleted

## 2024-08-30 NOTE — Telephone Encounter (Signed)
 Patient has been seen.

## 2024-08-30 NOTE — Telephone Encounter (Signed)
 Called patient after receiving a message that she was started on prednisone  taper dose there was no answer so left her a message to call back regarding this. Advised on the voicemail that the prednisone  can interact with warfarin and she can have some leafy veggies tonight and call us  back regarding this for a sooner appointment.   Per medication list prednisone  taper is 10mg  and the dose is 40 mg x1 day, then 30 mg x1 day, then 20 mg x1 day, then 10 mg x1 day, and then 5 mg x1 day and stop. The prescription was sent in on 08/24/24.

## 2024-08-31 ENCOUNTER — Telehealth: Payer: Self-pay

## 2024-08-31 NOTE — Telephone Encounter (Signed)
 I spoke to patient who recently started Prednisone  10/23-30 and advised her to reduce Coumadin  to 0.5 tablet on Friday and Monday and scheduled appt 11/6 @ 9:00

## 2024-08-31 NOTE — Telephone Encounter (Signed)
 Patient is returning call.

## 2024-09-07 ENCOUNTER — Encounter: Payer: Self-pay | Admitting: Cardiology

## 2024-09-07 ENCOUNTER — Encounter (HOSPITAL_COMMUNITY): Payer: Self-pay | Admitting: Internal Medicine

## 2024-09-07 ENCOUNTER — Ambulatory Visit (INDEPENDENT_AMBULATORY_CARE_PROVIDER_SITE_OTHER)

## 2024-09-07 ENCOUNTER — Ambulatory Visit (HOSPITAL_COMMUNITY)
Admission: RE | Admit: 2024-09-07 | Discharge: 2024-09-07 | Disposition: A | Discharge status: Home / Self Care | Source: Ambulatory Visit | Attending: Internal Medicine | Admitting: Internal Medicine

## 2024-09-07 VITALS — BP 118/78 | HR 92 | Ht 63.0 in | Wt 180.8 lb

## 2024-09-07 DIAGNOSIS — D6869 Other thrombophilia: Secondary | ICD-10-CM

## 2024-09-07 DIAGNOSIS — Z7901 Long term (current) use of anticoagulants: Secondary | ICD-10-CM | POA: Diagnosis not present

## 2024-09-07 DIAGNOSIS — I4819 Other persistent atrial fibrillation: Secondary | ICD-10-CM | POA: Diagnosis not present

## 2024-09-07 DIAGNOSIS — I480 Paroxysmal atrial fibrillation: Secondary | ICD-10-CM

## 2024-09-07 LAB — POCT INR: INR: 2 (ref 2.0–3.0)

## 2024-09-07 NOTE — Progress Notes (Signed)
 INR 2.0 Please see anticoagulation encounter Continue taking warfarin 1/2 tablet daily except 1 tablet on Mondays and Fridays.  Recheck INR in 6 weeks.  Coumadin  Clinic [phone]

## 2024-09-07 NOTE — Progress Notes (Addendum)
 Primary Care Physician: Verdia Lombard, MD Primary Cardiologist: Peter Jordan, MD Electrophysiologist: Soyla Gladis Norton, MD     Referring Physician: Dr. Jordan    Laurie Horton is a 78 y.o. female with a history of HTN, aortic and coronary artery atherosclerosis by CT imaging, history of tobacco use, chronic systolic HF with recovered LVEF, CVA, PAD, s/p right retinal artery occlusion, HLD, oxygen dependent COPD, fibromyalgia, PFO, and paroxysmal atrial fibrillation/flutter who presents for consultation in the [facility]. Recent hospital admission 3/1-01/2024 for PAF with RVR; she converted to NSR spontaneously while admitted. Seen by Cardiology on 3/19 noted to be in atrial flutter with RVR. Coreg  transitioned to metoprolol  50 mg BID. Patient is on coumadin  for a CHADS2VASC score of 8.  On evaluation today, she is currently in NSR. Patient notes she has been in Afib about 5 times since hospital discharge. She notes to be nauseous on metoprolol  but does admit that the combination of diltiazem  and metoprolol  seem to help her somewhat with her HR. She feels dizzy when out of rhythm and will have to lie down to make sure she doesn't fall. She is on coumadin .  On follow up 03/07/24, she is currently in NSR. Seen by Dr. Norton on 4/22 and proceed with rate control strategy; Tikosyn as a last option although borderline Qtc. Caution with metoprolol  given COPD. Patient is upset about a double prescription of prednisone  she recently received from provider office; this was not caught she received two prescriptions only taking the one. She notes variability in her BP readings at home, particularly her systolic blood pressure reading.   On follow up 09/07/24, patient is currently in Afib. Seen in ED on 10/21 for elevated BP noted to be in Afib. She is on coumadin .  Today, she denies symptoms of palpitations, chest pain, shortness of breath, orthopnea, PND, lower  extremity edema, dizziness, presyncope, syncope, snoring, daytime somnolence, bleeding, or neurologic sequela. The patient is tolerating medications without difficulties and is otherwise without complaint today.    she has a BMI of Body mass index is 32.03 kg/m.SABRA Filed Weights   09/07/24 0858  Weight: 82 kg    Current Outpatient Medications  Medication Sig Dispense Refill   acetaminophen  (TYLENOL ) 500 MG tablet Take 1,000 mg by mouth 2 (two) times daily as needed for moderate pain (pain score 4-6) or headache.     clopidogrel  (PLAVIX ) 75 MG tablet Take 1 tablet (75 mg total) by mouth daily. 90 tablet 3   desvenlafaxine  (PRISTIQ ) 25 MG 24 hr tablet Take 1 tablet (25 mg total) by mouth daily at 4 PM. 30 tablet 2   diltiazem  (CARDIZEM  CD) 240 MG 24 hr capsule TAKE 1 CAPSULE BY MOUTH EVERY DAY 90 capsule 1   diltiazem  (CARDIZEM ) 30 MG tablet Take 1 tablet (30 mg total) by mouth every 8 (eight) hours as needed (Heart rate greater than 130). 90 tablet 2   Evolocumab  (REPATHA  SURECLICK) 140 MG/ML SOAJ INJECT 140MG  INTO THE SKIN EVERY 14 DAYS 6 mL 3   furosemide  (LASIX ) 40 MG tablet TAKE 1 TABLET BY MOUTH EVERY DAY 90 tablet 2   gabapentin  (NEURONTIN ) 100 MG capsule Take 200 mg by mouth as needed (neuropathy).     HYDROcodone -acetaminophen  (NORCO/VICODIN) 5-325 MG tablet Take 1 tablet by mouth 2 (two) times daily.     ipratropium (ATROVENT ) 0.02 % nebulizer solution Take 2.5 mLs (0.5 mg total) by nebulization 4 (four) times daily. 300 mL 11  metoprolol  tartrate (LOPRESSOR ) 25 MG tablet Take 1 tablet (25 mg total) by mouth 2 (two) times daily. 180 tablet 3   nitroGLYCERIN  (NITROSTAT ) 0.4 MG SL tablet PLACE 1 TABLET UNDER THE TONGUE EVERY 5 MINUTES AS NEEDED FOR CHEST PAIN. 25 tablet 7   NON FORMULARY as needed ([employer]).     OXYGEN Inhale 2 L/min into the lungs continuous.     pantoprazole  (PROTONIX ) 40 MG tablet TAKE 1 TABLET BY MOUTH EVERY DAY 30 tablet 5   polyethylene glycol (MIRALAX  /  GLYCOLAX ) 17 g packet Take 17 g by mouth every other day.     predniSONE  (DELTASONE ) 10 MG tablet Please take prednisone  40 mg x1 day, then 30 mg x1 day, then 20 mg x1 day, then 10 mg x1 day, and then 5 mg x1 day and stop 11 tablet 0   spironolactone  (ALDACTONE ) 25 MG tablet TAKE 1/2 TABLET BY MOUTH EVERY DAY 15 tablet 11   traZODone  (DESYREL ) 100 MG tablet Take one tab daily as needed for sleep 30 tablet 2   warfarin (COUMADIN ) 5 MG tablet TAKE ONE-HALF TO 1 TABLET BY MOUTH DAILY OR AS DIRECTED BY CLINIC 30 tablet 3   No current facility-administered medications for this encounter.    Atrial Fibrillation Management history:  Previous antiarrhythmic drugs: none Previous cardioversions: none Previous ablations: none Anticoagulation history: coumadin    ROS- All systems are reviewed and negative except as per the HPI above.  Physical Exam: BP 118/78   Pulse 92   Ht 5' 3 (1.6 m)   Wt 82 kg   BMI 32.03 kg/m   GEN- The patient is using oxygen via nasal cannula, alert and oriented x 3 today.   Neck - no JVD or carotid bruit noted Lungs- Clear to ausculation bilaterally, normal work of breathing Heart- Irregular rate and rhythm, no murmurs, rubs or gallops, PMI not laterally displaced Extremities- no clubbing, cyanosis, or edema Skin - no rash or ecchymosis noted   EKG today demonstrates  Vent. rate 92 BPM PR interval * ms QRS duration 108 ms QT/QTcB 364/450 ms P-R-T axes * 80 -84 Atrial fibrillation Nonspecific T wave abnormality Abnormal ECG When compared with ECG of 21-Aug-2024 17:47, No significant change was found  Echo 01/02/24 demonstrated  1. Left ventricular ejection fraction, by estimation, is 65 to 70%. The  left ventricle has normal function. The left ventricle has no regional  wall motion abnormalities. There is mild left ventricular hypertrophy.  Left ventricular diastolic function  could not be evaluated.   2. Right ventricular systolic function is normal.  The right ventricular  size is normal.   3. Left atrial size was mildly dilated.   4. The mitral valve is abnormal. Trivial mitral valve regurgitation.   5. The aortic valve is tricuspid. Aortic valve regurgitation is not  visualized. Aortic valve sclerosis/calcification is present, without any  evidence of aortic stenosis.   6. Aortic dilatation noted. There is mild dilatation of the ascending  aorta, measuring 41 mm.   7. The inferior vena cava is normal in size with greater than 50%  respiratory variability, suggesting right atrial pressure of 3 mmHg.   Comparison(s): Changes from prior study are noted. 12/06/2021: LVEF 55-60%,  mild LAE.   ASSESSMENT & PLAN CHA2DS2-VASc Score = 8  The patient's score is based upon: CHF History: 1 HTN History: 1 Diabetes History: 0 Stroke History: 2 Vascular Disease History: 1 Age Score: 2 Gender Score: 1       ASSESSMENT  AND PLAN: Persistent Atrial Fibrillation (ICD10:  I48.0) The patient's CHA2DS2-VASc score is 8, indicating a 10.8% annual risk of stroke.    Patient is currently in Afib. Continue diltiazem  240 mg daily. Continue Lopressor  25 mg BID. She still does not want to do Tikosyn hospital admission. Continue rate control strategy. We discussed cardioversion procedure to try to restore sinus rhythm. She is interested in this but due to upcoming holidays declines to begin 4 weekly therapeutic INR checks at this time. She would like to be seen in January to re consider cardioversion.   Secondary Hypercoagulable State (ICD10:  D68.69) The patient is at significant risk for stroke/thromboembolism based upon her CHA2DS2-VASc Score of 8.  Continue Warfarin (Coumadin ).  She is on coumadin .    Follow up 2 months Afib clinic.    Terra Pac, PA-C  [facility] [address] [place], [place] [address] [phone]

## 2024-09-07 NOTE — Patient Instructions (Signed)
 Continue taking warfarin 1/2 tablet daily except 1 tablet on Mondays and Fridays.  Recheck INR in 6 weeks.  Coumadin  Clinic [phone]

## 2024-09-21 ENCOUNTER — Ambulatory Visit

## 2024-10-05 ENCOUNTER — Other Ambulatory Visit: Payer: Self-pay

## 2024-10-05 ENCOUNTER — Ambulatory Visit (HOSPITAL_COMMUNITY): Admitting: Psychiatry

## 2024-10-05 ENCOUNTER — Encounter (HOSPITAL_COMMUNITY): Payer: Self-pay | Admitting: Psychiatry

## 2024-10-05 VITALS — BP 124/84 | HR 103 | Resp 20 | Ht 63.0 in | Wt 182.4 lb

## 2024-10-05 DIAGNOSIS — F99 Mental disorder, not otherwise specified: Secondary | ICD-10-CM | POA: Diagnosis not present

## 2024-10-05 DIAGNOSIS — F331 Major depressive disorder, recurrent, moderate: Secondary | ICD-10-CM

## 2024-10-05 DIAGNOSIS — F5105 Insomnia due to other mental disorder: Secondary | ICD-10-CM | POA: Diagnosis not present

## 2024-10-05 DIAGNOSIS — F411 Generalized anxiety disorder: Secondary | ICD-10-CM | POA: Diagnosis not present

## 2024-10-05 MED ORDER — TRAZODONE HCL 100 MG PO TABS: ORAL_TABLET | ORAL | 2 refills | Status: AC

## 2024-10-05 MED ORDER — DESVENLAFAXINE SUCCINATE ER 25 MG PO TB24: 25.0000 mg | ORAL_TABLET | Freq: Every day | ORAL | 2 refills | Status: AC
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
## 2024-10-19 ENCOUNTER — Other Ambulatory Visit: Payer: Self-pay | Admitting: Cardiology

## 2024-10-19 ENCOUNTER — Ambulatory Visit: Attending: Cardiology

## 2024-10-19 DIAGNOSIS — I480 Paroxysmal atrial fibrillation: Secondary | ICD-10-CM

## 2024-10-19 DIAGNOSIS — Z7901 Long term (current) use of anticoagulants: Secondary | ICD-10-CM | POA: Diagnosis not present

## 2024-10-19 LAB — POCT INR: INR: 2.5 (ref 2.0–3.0)

## 2024-10-19 NOTE — Telephone Encounter (Signed)
 Refill request for warfarin:  Last INR was 2.0 on 09/09/24 Next INR due 10/19/24 LOV was 09/07/24  Refill approved.

## 2024-10-19 NOTE — Patient Instructions (Signed)
 Continue taking warfarin 1/2 tablet daily except 1 tablet on Mondays and Fridays.  Recheck INR in 4 weeks.  Coumadin  Clinic [phone]

## 2024-10-19 NOTE — Progress Notes (Signed)
 INR 2.5 Please see anticoagulation encounter Continue taking warfarin 1/2 tablet daily except 1 tablet on Mondays and Fridays.  Recheck INR in 4 weeks.  Coumadin  Clinic [phone]

## 2024-11-06 ENCOUNTER — Encounter: Payer: Self-pay | Admitting: Cardiology

## 2024-11-07 ENCOUNTER — Ambulatory Visit (HOSPITAL_COMMUNITY)
Admission: RE | Admit: 2024-11-07 | Discharge: 2024-11-07 | Disposition: A | Discharge status: Home / Self Care | Source: Ambulatory Visit | Attending: Internal Medicine | Admitting: Internal Medicine

## 2024-11-07 ENCOUNTER — Encounter (HOSPITAL_COMMUNITY): Payer: Self-pay | Admitting: Internal Medicine

## 2024-11-07 VITALS — BP 110/80 | HR 84 | Ht 63.0 in | Wt 178.2 lb

## 2024-11-07 DIAGNOSIS — I480 Paroxysmal atrial fibrillation: Secondary | ICD-10-CM | POA: Diagnosis not present

## 2024-11-07 DIAGNOSIS — I4819 Other persistent atrial fibrillation: Secondary | ICD-10-CM | POA: Diagnosis not present

## 2024-11-07 DIAGNOSIS — D6869 Other thrombophilia: Secondary | ICD-10-CM

## 2024-11-07 NOTE — Progress Notes (Signed)
 "   Primary Care Physician: Verdia Lombard, MD Primary Cardiologist: Peter Jordan, MD Electrophysiologist: Soyla Gladis Norton, MD     Referring Physician: Dr. Jordan    Laurie Horton is a 79 y.o. female with a history of HTN, aortic and coronary artery atherosclerosis by CT imaging, history of tobacco use, chronic systolic HF with recovered LVEF, CVA, PAD, s/p right retinal artery occlusion, HLD, oxygen dependent COPD, fibromyalgia, PFO, and paroxysmal atrial fibrillation/flutter who presents for consultation in the [name]. Recent hospital admission 3/1-01/2024 for PAF with RVR; she converted to NSR spontaneously while admitted. Seen by Cardiology on 3/19 noted to be in atrial flutter with RVR. Coreg  transitioned to metoprolol  50 mg BID. Patient is on coumadin  for a CHADS2VASC score of 8.  On evaluation today, she is currently in NSR. Patient notes she has been in Afib about 5 times since hospital discharge. She notes to be nauseous on metoprolol  but does admit that the combination of diltiazem  and metoprolol  seem to help her somewhat with her HR. She feels dizzy when out of rhythm and will have to lie down to make sure she doesn't fall. She is on coumadin .  On follow up 03/07/24, she is currently in NSR. Seen by Dr. Norton on 4/22 and proceed with rate control strategy; Tikosyn as a last option although borderline Qtc. Caution with metoprolol  given COPD. Patient is upset about a double prescription of prednisone  she recently received from provider office; this was not caught she received two prescriptions only taking the one. She notes variability in her BP readings at home, particularly her systolic blood pressure reading.   On follow up 09/07/24, patient is currently in Afib. Seen in ED on 10/21 for elevated BP noted to be in Afib. She is on coumadin .  Follow-up 11/08/2023.  Patient is currently in A-fib.  Patient at last office visit wished to wait 2 months  during the holiday season to readdress her A-fib. Patient discussed her options again with children, namely Tikosyn admission or cardioversion. At this time, patient declines any intervention and wishes to pursue rate control strategy.   Today, she denies symptoms of palpitations, chest pain, shortness of breath, orthopnea, PND, lower extremity edema, dizziness, presyncope, syncope, snoring, daytime somnolence, bleeding, or neurologic sequela. The patient is tolerating medications without difficulties and is otherwise without complaint today.    she has a BMI of Body mass index is 31.57 kg/m.SABRA Filed Weights   11/07/24 0918  Weight: 80.8 kg     Current Outpatient Medications  Medication Sig Dispense Refill   acetaminophen  (TYLENOL ) 500 MG tablet Take 1,000 mg by mouth 2 (two) times daily as needed for moderate pain (pain score 4-6) or headache.     clopidogrel  (PLAVIX ) 75 MG tablet Take 1 tablet (75 mg total) by mouth daily. 90 tablet 3   desvenlafaxine  (PRISTIQ ) 25 MG 24 hr tablet Take 1 tablet (25 mg total) by mouth daily at 4 PM. 30 tablet 2   diltiazem  (CARDIZEM  CD) 240 MG 24 hr capsule TAKE 1 CAPSULE BY MOUTH EVERY DAY 90 capsule 1   diltiazem  (CARDIZEM ) 30 MG tablet Take 1 tablet (30 mg total) by mouth every 8 (eight) hours as needed (Heart rate greater than 130). 90 tablet 2   Evolocumab  (REPATHA  SURECLICK) 140 MG/ML SOAJ INJECT 140MG  INTO THE SKIN EVERY 14 DAYS 6 mL 3   furosemide  (LASIX ) 40 MG tablet TAKE 1 TABLET BY MOUTH EVERY DAY 90 tablet 2   gabapentin  (  NEURONTIN ) 100 MG capsule Take 200 mg by mouth as needed (neuropathy).     HYDROcodone -acetaminophen  (NORCO/VICODIN) 5-325 MG tablet Take 1 tablet by mouth 2 (two) times daily.     ipratropium (ATROVENT ) 0.02 % nebulizer solution Take 2.5 mLs (0.5 mg total) by nebulization 4 (four) times daily. 300 mL 11   metoprolol  tartrate (LOPRESSOR ) 25 MG tablet Take 1 tablet (25 mg total) by mouth 2 (two) times daily. 180 tablet 3    nitroGLYCERIN  (NITROSTAT ) 0.4 MG SL tablet PLACE 1 TABLET UNDER THE TONGUE EVERY 5 MINUTES AS NEEDED FOR CHEST PAIN. 25 tablet 7   NON FORMULARY as needed ([employer]).     OXYGEN Inhale 2 L/min into the lungs continuous.     polyethylene glycol (MIRALAX  / GLYCOLAX ) 17 g packet Take 17 g by mouth every other day.     spironolactone  (ALDACTONE ) 25 MG tablet TAKE 1/2 TABLET BY MOUTH EVERY DAY 15 tablet 11   traZODone  (DESYREL ) 100 MG tablet Take one tab daily as needed for sleep 30 tablet 2   warfarin (COUMADIN ) 5 MG tablet TAKE 1/2 TO 1 TABLET BY MOUTH DAILY OR AS DIRECTED BY CLINIC 30 tablet 3   No current facility-administered medications for this encounter.    Atrial Fibrillation Management history:  Previous antiarrhythmic drugs: none Previous cardioversions: none Previous ablations: none Anticoagulation history: coumadin    ROS- All systems are reviewed and negative except as per the HPI above.  Physical Exam: BP 110/80   Pulse 84   Ht 5' 3 (1.6 m)   Wt 80.8 kg   BMI 31.57 kg/m   GEN- The patient is well appearing, alert and oriented x 3 today.  Patient is on oxygen via nasal cannula. Neck - no JVD or carotid bruit noted Lungs- Clear to ausculation bilaterally, normal work of breathing Heart- Irregular rate and rhythm, no murmurs, rubs or gallops, PMI not laterally displaced Extremities- no clubbing, cyanosis, or edema Skin - no rash or ecchymosis noted    EKG today demonstrates  EKG Interpretation Date/Time:  Tuesday November 07 2024 09:20:57 EST Ventricular Rate:  84 PR Interval:    QRS Duration:  106 QT Interval:  378 QTC Calculation: 446 R Axis:   81  Text Interpretation: Atrial fibrillation with a competing junctional pacemaker Nonspecific T wave abnormality Abnormal ECG When compared with ECG of 07-Sep-2024 09:01, PREVIOUS ECG IS PRESENT Confirmed by Terra Pac ([id number]) on 11/07/2024 9:38:19 AM    Echo 01/02/24 demonstrated  1. Left ventricular ejection  fraction, by estimation, is 65 to 70%. The  left ventricle has normal function. The left ventricle has no regional  wall motion abnormalities. There is mild left ventricular hypertrophy.  Left ventricular diastolic function  could not be evaluated.   2. Right ventricular systolic function is normal. The right ventricular  size is normal.   3. Left atrial size was mildly dilated.   4. The mitral valve is abnormal. Trivial mitral valve regurgitation.   5. The aortic valve is tricuspid. Aortic valve regurgitation is not  visualized. Aortic valve sclerosis/calcification is present, without any  evidence of aortic stenosis.   6. Aortic dilatation noted. There is mild dilatation of the ascending  aorta, measuring 41 mm.   7. The inferior vena cava is normal in size with greater than 50%  respiratory variability, suggesting right atrial pressure of 3 mmHg.   Comparison(s): Changes from prior study are noted. 12/06/2021: LVEF 55-60%,  mild LAE.   ASSESSMENT & PLAN CHA2DS2-VASc Score =  8  The patient's score is based upon: CHF History: 1 HTN History: 1 Diabetes History: 0 Stroke History: 2 Vascular Disease History: 1 Age Score: 2 Gender Score: 1       ASSESSMENT AND PLAN: Persistent Atrial Fibrillation (ICD10:  I48.0) The patient's CHA2DS2-VASc score is 8, indicating a 10.8% annual risk of stroke.    Patient is currently in A-fib.  Patient has notified me today she wishes to continue rate control strategy only at this time.  She does not wish to proceed with Tikosyn hospital admission as an option suggested previously by Dr. Inocencio.  She also spoke about the potential for a cardioversion with her children and after discussion declines to proceed with this.  Continue diltiazem  to 40 mg daily.  Continue Lopressor  25 mg twice daily.  Continue diltiazem  30 mg every 4 hours as needed for palpitations.   Secondary Hypercoagulable State (ICD10:  D68.69) The patient is at significant risk for  stroke/thromboembolism based upon her CHA2DS2-VASc Score of 8.  Continue Warfarin (Coumadin ).  Patient is currently on Coumadin .  Most recent INR 2.5 on 10/19/2024.    Follow up A-fib clinic as needed.   Terra Pac, PA-C  [facility] [address] [place], [place] [address] [phone]  "

## 2024-11-09 MED ORDER — REPATHA SURECLICK 140 MG/ML ~~LOC~~ SOAJ: 1.0000 mL | SUBCUTANEOUS | 0 refills | Status: DC

## 2024-11-13 ENCOUNTER — Encounter: Payer: Self-pay | Admitting: Cardiology

## 2024-11-13 NOTE — Progress Notes (Unsigned)
 "   Cardiology Office Note    Date:  11/17/2024   ID:  Laurie Horton, Laurie Horton 1946-02-02, MRN [id number]  PCP:  Verdia Lombard, MD  Cardiologist: Dr. Kamayah Pillay   Chief Complaint  Patient presents with   Atrial Fibrillation     History of Present Illness:    Laurie Horton is a 79 y.o. female with past medical history of chronic diastolic CHF, PFO, PAF (on Coumadin ), COPD (on 2L [place] at baseline), HTN, and prior CVA who is seen for follow up CHF and PAD.  She was admitted from 4/9 - 02/12/2017 for worsening dyspnea on exertion and palpitations. Was in atrial fibrillation with RVR upon arrival to the ED. Echo during admission showed a newly reduced EF of 20-25% and she was diuresed with IV Lasix . Enzymes were negative and EKG showed no acute ischemic changes. It was recommended to consider a right/left heart cath in 4-6 weeks.   She did undergo right and left heart cath on 03/04/17. This showed severe 2 vessel obstructive CAD with 100% RCA occlusion, 75% OM1, and 90% small OM2. EF 25-30%. Mild pulmonary HTN with normal LV filling pressures. 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 Maximizing CHF therapy recommended.  She did have repeat Echo in September 2018 showing improvement in EF to 50-55%.   Subsequent to this she developed significant claudication. She was seen by Dr. Court and had angiography showing 80% infrarenal aortic stenosis and left iliac stenosis. She also had severe right common femoral stenosis. She was seen by Dr Serene for consideration of Aortobifemoral bypass. After pulmonary evaluation she was felt to be too high a risk for open surgery. In March 2019 she underwent atherectomy and covered stenting of the left iliac by Dr. Court. On follow up she did have improvement in her claudication and ABIs. The aortic and right common femoral artery stenoses are not felt to be amenable to percutaneous therapy.   In early July 2019 she was seen because she felt she was in Afib  following dental procedure. On arrival she was in NSR with PACs. No medical changes made.   She has been followed by Dr Court. She was noted to have a high grade left subclavian stenosis but it was unclear that this was symptomatic and given all her medical problems was felt best to manage medically. She had left shoulder injection by Dr Anderson for adhesive capsulitis. She notes this has helped with her pain significantly. She is followed by pulmonary for COPD.   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 INR had been therapeutic. At time of infarct it was 1.9. We decided to add plavix  75 mg daily due to her extensive vascular disease. MRI showed no acute infarct but she did have evidence of multiple old strokes.   She had PNA in Dec/Jan. Followed by Dr Tonna for COPD exacerbation. Had CT done. Aortic size at 4.1 cm.    Patient presented to the hospital on 12/06/2021 with palpitations, chest pain, she was found to be in A-fib with RVR.  Serial troponin went from 9 --> 24 --> 36.  While in the ED, patient converted to sinus rhythm spontaneously. She was seen by the cardiology fellow who felt that she was not in CHF. Echocardiogram obtained on the same day showed EF 55 to 60%, no regional wall motion abnormality, mild LVH, mild LAE. She was discharged to follow-up with cardiology service as  outpatient.  CT of the head obtained on 12/08/2021 was negative for acute intracranial abnormality.  Heart monitor obtained in February showed several episodes of A-fib and SVT, however A-fib burden was fairly low at 2%.  She was admitted 01/01/2024 in the setting of atrial fibrillation with RVR.  Cardiology was consulted.  She was transitioned to oral diltiazem .  She spontaneously converted to normal sinus rhythm.  Echocardiogram showed EF 65 to 70%, no RWMA, mild LVH, normal RV, mild LAE, trivial MR, aortic sclerosis without evidence of stenosis, mild  dilation of the ascending aorta measuring 41 mm. When seen back in the office she was in Atrial flutter with RVR. Continued on diltiazem  and Coreg  switched to metoprolol . Seen in Afib clinic and by Dr Inocencio. Noted in and out of Afib. Diltiazem  dose increased. Only AAD option would be Tikosyn and QTc is mildly prolonged.   Yesterday she was seen in the ED. Felt dizzy and noted BP up to 155 systolic. Called EMS and convinced her to go to ED. BP in ED was normal. She was in Afib with rate 109 on Ecg. Spent 15 hours in ED and sent home. Notes she is compliant with meds. Takes extra diltiazem  short acting if HR elevated which is what she did. Feels like she has been in Afib for 2 weeks. She complains of lightheadedness and fatigue. Seen in Afib clinic again on 11/08/23. Again offered option of Tikosyn which patient declined - pursue rate control strategy.   On follow up today she is frustrated. Notes she had the flu which laid her up for 10 days. Had N/V and diarrhea. Still has labile BP and cough. HR generally controlled by her BP monitor. No increase in edema. No chest pain. Does feel dizzy at times.     Past Medical History:  Diagnosis Date   Acute diastolic heart failure, NYHA class 1 (HCC) 02/08/2017   Acute heart failure (HCC) 02/08/2017   Allergy    Anxiety    Arrhythmia 1987   Arthritis    Cataract    OD   CHF (congestive heart failure) (HCC) 03/02/2017   EF 25-30% 2018   Complication of anesthesia    various issues with oxygen  saturations post op   COPD (chronic obstructive pulmonary disease) (HCC)    Depression    Depression    [employer]reesia 11/04/2020   Diverticulitis    DVT (deep venous thrombosis) (HCC)    Fibromyalgia    GERD (gastroesophageal reflux disease)    Heart murmur    [employer]reesia 11/04/2020   Hiatal hernia    History of deviated nasal septum    left- side   HTN (hypertension)    Hyperlipidemia    Hypertension 1983   Phreesia 11/04/2020   Hypertensive retinopathy     OU   Myocardial infarction (HCC)    [employer]reesia 11/04/2020   Obesity    On supplemental oxygen therapy    concentrator at night @ 1.5 l/m or when sleeps. O2 Sat niormally 87.   Osteoporosis    Osteoporosis    Phreesia 11/04/2020   Oxygen deficiency    Phreesia 11/04/2020   PAT (paroxysmal atrial tachycardia)    PFO (patent foramen ovale)    PULMONARY NODULE, LEFT LOWER LOBE 10/14/2009   5mm LLL nodule dec 2010. Stable and 4mm in Oct 2012. No further fu   PVD (peripheral vascular disease) with claudication 12/2017   Right middle lobe pneumonia 07/24/2011   First noted at admit 07/10/11. Persists on  cxr 07/22/11. Cleared on CT 08/24/11. No further followup   Stroke [facility])    TOBACCO ABUSE 06/04/2009    Past Surgical History:  Procedure Laterality Date   ABDOMINAL HYSTERECTOMY N/A 1989   Phreesia 11/04/2020   CARDIOVASCULAR STRESS TEST  12/26/2004   EF 74%. NO EVIDENCE OF ISCHEMIA   CATARACT EXTRACTION Left    Dr. FABIENE Gaudy   ESOPHAGOGASTRODUODENOSCOPY (EGD) WITH PROPOFOL  N/A 04/15/2015   Procedure: ESOPHAGOGASTRODUODENOSCOPY (EGD) WITH PROPOFOL ;  Surgeon: Lynwood Bohr, MD;  Location: [facility] ENDOSCOPY;  Service: Endoscopy;  Laterality: N/A;   EYE SURGERY Left 2018   Cat Sx   JOINT REPLACEMENT N/A 06/2005   Phreesia 11/04/2020   KNEE ARTHROSCOPY  2000   left   LAPAROSCOPIC CHOLECYSTECTOMY  04/16/2010   [facility]   LOWER EXTREMITY ANGIOGRAPHY N/A 09/09/2017   Procedure: Lower Extremity Angiography;  Surgeon: Court Dorn PARAS, MD;  Location: [facility] INVASIVE CV LAB;  Service: Cardiovascular;  Laterality: N/A;   LOWER EXTREMITY INTERVENTION Left 01/17/2018   Procedure: LOWER EXTREMITY INTERVENTION;  Surgeon: Court Dorn PARAS, MD;  Location: [facility] INVASIVE CV LAB;  Service: Cardiovascular;  Laterality: Left;   MOUTH SURGERY     03-26-15 multiple extractions stitches remains   PERIPHERAL VASCULAR INTERVENTION Left 01/17/2018   Procedure: PERIPHERAL VASCULAR INTERVENTION;  Surgeon: Court Dorn PARAS, MD;  Location: [facility] INVASIVE CV LAB;  Service: Cardiovascular;  Laterality: Left;  COMMON ILIAC   RIGHT/LEFT HEART CATH AND CORONARY ANGIOGRAPHY N/A 03/04/2017   Procedure: Right/Left Heart Cath and Coronary Angiography;  Surgeon: Tihanna Goodson M Efraim Vanallen, MD;  Location: [facility] INVASIVE CV LAB;  Service: Cardiovascular;  Laterality: N/A;   TOTAL ABDOMINAL HYSTERECTOMY     post op needed oxygen was told she gave them a scare   TUBAL LIGATION  1978   US  ECHOCARDIOGRAPHY  11/20/2009   EF 55-60%    Current Medications: Outpatient Medications Prior to Visit  Medication Sig Dispense Refill   acetaminophen  (TYLENOL ) 500 MG tablet Take 1,000 mg by mouth 2 (two) times daily as needed for moderate pain (pain score 4-6) or headache.     clopidogrel  (PLAVIX ) 75 MG tablet Take 1 tablet (75 mg total) by mouth daily. 90 tablet 3   desvenlafaxine  (PRISTIQ ) 25 MG 24 hr tablet Take 1 tablet (25 mg total) by mouth daily at 4 PM. 30 tablet 2   diltiazem  (CARDIZEM  CD) 240 MG 24 hr capsule TAKE 1 CAPSULE BY MOUTH EVERY DAY 90 capsule 1   diltiazem  (CARDIZEM ) 30 MG tablet Take 1 tablet (30 mg total) by mouth every 8 (eight) hours as needed (Heart rate greater than 130). 90 tablet 2   Evolocumab  (REPATHA  SURECLICK) 140 MG/ML SOAJ INJECT 140MG  INTO THE SKIN EVERY 14 DAYS 6 mL 3   furosemide  (LASIX ) 40 MG tablet TAKE 1 TABLET BY MOUTH EVERY DAY 90 tablet 2   gabapentin  (NEURONTIN ) 100 MG capsule Take 200 mg by mouth as needed (neuropathy).     HYDROcodone -acetaminophen  (NORCO/VICODIN) 5-325 MG tablet Take 1 tablet by mouth 2 (two) times daily.     ipratropium (ATROVENT ) 0.02 % nebulizer solution Take 2.5 mLs (0.5 mg total) by nebulization 4 (four) times daily. 300 mL 11   metoprolol  tartrate (LOPRESSOR ) 25 MG tablet Take 1 tablet (25 mg total) by mouth 2 (two) times daily. 180 tablet 3   nitroGLYCERIN  (NITROSTAT ) 0.4 MG SL tablet PLACE 1 TABLET UNDER THE TONGUE EVERY 5 MINUTES AS NEEDED FOR CHEST PAIN. 25 tablet 7   NON  FORMULARY as needed (  Hempvana).     OXYGEN Inhale 2 L/min into the lungs continuous.     polyethylene glycol (MIRALAX  / GLYCOLAX ) 17 g packet Take 17 g by mouth every other day.     spironolactone  (ALDACTONE ) 25 MG tablet TAKE 1/2 TABLET BY MOUTH EVERY DAY 15 tablet 11   traZODone  (DESYREL ) 100 MG tablet Take one tab daily as needed for sleep 30 tablet 2   warfarin (COUMADIN ) 5 MG tablet TAKE 1/2 TO 1 TABLET BY MOUTH DAILY OR AS DIRECTED BY CLINIC 30 tablet 3   Evolocumab  (REPATHA  SURECLICK) 140 MG/ML SOAJ Inject 140 mg into the skin every 14 (fourteen) days. (Patient not taking: Reported on 11/17/2024) 1 mL 0   No facility-administered medications prior to visit.     Allergies:   Bee venom, Iodinated contrast media, Iodine , Sudafed [pseudoephedrine], Tessalon [benzonatate], Vueway  [gadopiclenol ], Xanax [alprazolam], Azithromycin , Budesonide -formoterol  fumarate, Crestor  [rosuvastatin  calcium ], Flonase [fluticasone propionate], Lamictal  [lamotrigine ], Lotrimin [clotrimazole], Lunesta [eszopiclone], Oxcarbazepine, Statins, Ambien [zolpidem tartrate], Betadine [povidone iodine ], Bevespi  aerosphere [glycopyrrolate -formoterol ], Biaxin  [clarithromycin ], Breztri  aerosphere [budeson-glycopyrrol-formoterol ], Claritin-d [loratadine-pseudoephedrine er], Effexor  [venlafaxine ], Lexapro  [escitalopram  oxalate], Nexium [esomeprazole magnesium ], Aciphex [rabeprazole sodium], Avelox [moxifloxacin hcl in nacl], Bentyl [dicyclomine], Bextra [valdecoxib], Ceclor [cefaclor], Covera-hs  [verapamil  hcl], Dicyclomine hcl, Estrace [estradiol], Fosamax  [alendronate  sodium], Keflex [cephalexin], and Other   Social History   Socioeconomic History   Marital status: Divorced    Spouse name: Not on file   Number of children: 2   Years of education: Not on file   Highest education level: Not on file  Occupational History   Occupation: disability  Tobacco Use   Smoking status: Former    Current packs/day: 0.00    Average  packs/day: 3.0 packs/day for 30.0 years (90.0 ttl pk-yrs)    Types: Cigarettes    Start date: 06/02/1980    Quit date: 06/02/2010    Years since quitting: 14.4   Smokeless tobacco: Never   Tobacco comments:    Former smoker 09/07/24  Vaping Use   Vaping status: Never Used  Substance and Sexual Activity   Alcohol use: No    Alcohol/week: 0.0 standard drinks of alcohol   Drug use: No   Sexual activity: Yes    Partners: Male    Birth control/protection: Post-menopausal, Surgical  Other Topics Concern   Not on file  Social History Narrative   Marital status: divorced in 1978 after ten years; dating casually in 2019.      Children: 3 biological children; 3 court appointed children; 6 grandchildren; 5 gg      Lives: alone; children in [place] and [place].      Employment: retired age 89; disability for COPD, CVA at age 89.      Tobacco: former smoker; quit smoking 2011.      Alcohol: none      Exercise: walks dog four times per day; goes to pool three times per week.      ADLs: drives; no assistant devices; does have a walker.  Cleaning is limited in 2018.  Daughter helps with cleaning.  Does own grocery shopping.      Advanced Directives: YES; DNR/DNI; HCPOA: [employer]/daughter youngest.  Blind.               Social Drivers of Health   Tobacco Use: Medium Risk (11/17/2024)   Patient History    Smoking Tobacco Use: Former    Smokeless Tobacco Use: Never    Passive Exposure: Not on file  Financial Resource Strain: Not on file  Food Insecurity: No Food Insecurity (01/02/2024)   Hunger Vital Sign    Worried About Running Out of Food in the Last Year: Never true    Ran Out of Food in the Last Year: Never true  Transportation Needs: No Transportation Needs (01/01/2024)   PRAPARE - Administrator, Civil Service (Medical): No    Lack of Transportation (Non-Medical): No  Physical Activity: Not on file  Stress: Not on file  Social Connections: Unknown (01/01/2024)    Social Connection and Isolation Panel    Frequency of Communication with Friends and Family: More than three times a week    Frequency of Social Gatherings with Friends and Family: Patient declined    Attends Religious Services: Patient declined    Database Administrator or Organizations: Patient declined    Attends Banker Meetings: Patient declined    Marital Status: Patient declined  Depression (PHQ2-9): Medium Risk (12/30/2023)   Depression (PHQ2-9)    PHQ-2 Score: 5  Alcohol Screen: Not on file  Housing: Low Risk (01/01/2024)   Housing Stability Vital Sign    Unable to Pay for Housing in the Last Year: No    Number of Times Moved in the Last Year: 0    Homeless in the Last Year: No  Utilities: Not At Risk (01/01/2024)   AHC Utilities    Threatened with loss of utilities: No  Health Literacy: Not on file     Family History:  The patient's family history includes Alzheimer's disease in her mother; Dementia in her mother; Diabetes in her mother and sister; Heart attack in her father; Heart attack (age of onset: 38) in her brother; Schizophrenia in her sister; Tremor in her sister.   Review of Systems:   As noted in HPI.  All other systems reviewed and are otherwise negative except as noted above.   Physical Exam:    VS:  BP 124/60 (BP Location: Right Arm, Cuff Size: Large)   Pulse (!) 124   Ht 5' 3 (1.6 m)   Wt 177 lb 3.2 oz (80.4 kg)   SpO2 (!) 87%   BMI 31.39 kg/m    GENERAL:  Well appearing overweight WF in NAD HEENT:  PERRL, EOMI, sclera are clear. Oropharynx is clear. NECK:  No jugular venous distention, carotid upstroke brisk and symmetric, right carotid bruit,  left subclavian  bruit, no thyromegaly or adenopathy LUNGS:  Clear to auscultation bilaterally CHEST:  + scoliosis.  HEART:  IRRR,  PMI not displaced or sustained,S1 and S2 within normal limits, no S3, no S4: no clicks, no rubs, no murmurs.  ABD:  Soft, nontender. BS +, no masses or bruits. No  hepatomegaly, no splenomegaly EXT:  Poor pedal pulses, absent left radial pulse.  no edema, no cyanosis no clubbing SKIN:  Warm and dry.  No rashes NEURO:  Alert and oriented x 3. Cranial nerves II through XII intact. PSYCH:  Cognitively intact    Wt Readings from Last 3 Encounters:  11/17/24 177 lb 3.2 oz (80.4 kg)  11/07/24 178 lb 3.2 oz (80.8 kg)  09/07/24 180 lb 12.8 oz (82 kg)     Studies/Labs Reviewed:   Recent Labs: 01/01/2024: B Natriuretic Peptide 219.3; Magnesium  1.8; TSH 1.394 08/21/2024: ALT 13; BUN 11; Creatinine, Ser 1.07; Hemoglobin 14.4; Platelets 214; Potassium 3.8; Sodium 135   Lipid Panel    Component Value Date/Time   CHOL 124 01/17/2024 0839   TRIG 150 (H) 01/17/2024 0839   HDL  44 01/17/2024 0839   CHOLHDL 2.8 01/17/2024 0839   CHOLHDL 3 07/02/2021 1017   VLDL 44.0 (H) 07/02/2021 1017   LDLCALC 54 01/17/2024 0839   LDLDIRECT 67.0 07/02/2021 1017  Dated 11/20/21: cholesterol 142, HDL 42,  Dated 03/23/22:  A1c 6.3%. triglycerides122   Additional studies/ records that were reviewed today include:   Echocardiogram: 02/09/2017 Study Conclusions   - Left ventricle: The cavity size was mildly dilated. Wall   thickness was increased in a pattern of mild LVH. Systolic   function was severely reduced. The estimated ejection fraction   was in the range of 20% to 25%. Diffuse hypokinesis. Doppler   parameters are consistent with abnormal left ventricular   relaxation (grade 1 diastolic dysfunction). - Aortic valve: Valve area (Vmax): 1.4 cm^2. - Aortic root: The aortic root was mildly dilated. - Ascending aorta: The ascending aorta was mildly dilated. - Mitral valve: Calcified annulus. There was moderate   regurgitation. - Left atrium: The atrium was moderately dilated. - Right ventricle: Systolic function was moderately reduced. - Pulmonary arteries: Systolic pressure was mildly increased. - Pericardium, extracardiac: A trivial pericardial effusion was    identified.   Impressions:   - No subcostal views; severe global reduction in LV systolic   function; grade 1 diastolic dysfunction; mildly dilated aortic   root and ascending aorta; moderate MR; moderaet LAE; moderately   reduced RV function; mild TR; mildly elevated pulmonary pressure.  Procedures   Right/Left Heart Cath and Coronary Angiography  Conclusion     Ost 1st Mrg to 1st Mrg lesion, 75 %stenosed. 2nd Mrg lesion, 90 %stenosed. Ost RCA to Mid RCA lesion, 100 %stenosed. There is severe left ventricular systolic dysfunction. LV end diastolic pressure is normal. The left ventricular ejection fraction is 25-35% by visual estimate. Hemodynamic findings consistent with mild pulmonary hypertension. LV end diastolic pressure is normal.   1. Severe 2 vessel obstructive CAD    - 75% proximal OM1    - 90% small OM2    - 100% proximal RCA. Left to right collaterals.  2. Severe LV dysfunction 3. Mild pulmonary HTN with normal LV filling pressures.  4. Cardiac index 2.41 L/min/BSA     Plan: Medical management to try and optimize CHF therapy. Patient appears to be adequately diuresed at this time. Based on these results her cardiomyopathy is ischemic. I would treat her CAD medically. If cardiac cath is needed in the future would consider alternative access given difficulty from the right radial approach. Her rhythm during procedure is a multifocal atrial rhythm/tachycardia.      Echo 07/08/17: Study Conclusions   - Left ventricle: The cavity size was normal. There was moderate   concentric hypertrophy. Systolic function was normal. The   estimated ejection fraction was in the range of 50% to 55%.   Severe hypokinesis of the basal-midinferior myocardium;   consistent with infarction in the distribution of the right   coronary artery. Doppler parameters are consistent with abnormal   left ventricular relaxation (grade 1 diastolic dysfunction). - Mitral valve: Calcified annulus.    Impressions:   - Compared to April 2018 there is marked improvement in contraction   of all LV wall segemnts except for the inferior wall, which   remains severely hypokinetic. There is also marked reduction in   the severity of mitral insufficiency.    Echo 12/06/2021  1. Left ventricular ejection fraction, by estimation, is 55 to 60%. The  left ventricle has normal function. The left ventricle  has no regional  wall motion abnormalities. There is mild left ventricular hypertrophy.  Left ventricular diastolic parameters  were normal.   2. Right ventricular systolic function is normal. The right ventricular  size is normal.   3. Left atrial size was mildly dilated.   4. The mitral valve is normal in structure. No evidence of mitral valve  regurgitation. No evidence of mitral stenosis.   5. The aortic valve is tricuspid. There is mild calcification of the  aortic valve. There is mild thickening of the aortic valve. Aortic valve  regurgitation is not visualized. Aortic valve sclerosis is present, with  no evidence of aortic valve stenosis.   6. The inferior vena cava is normal in size with greater than 50%  respiratory variability, suggesting right atrial pressure of 3 mmHg.     Monitor 01/02/22: Study Highlights    Normal sinus rhythm Atrial fibrillation with RVR on 12/19/21 and 12/21/21 with longest episode lasting 5 hours and 40 minutes and average HR 120 bpm. AFib burden 2% Frequent PACs with few short bursts of SVT longest lasting 15 beats Occasional PVCs with rare NSVT longest lasting 5 beats. Symptoms appear to correlate with Afib an SVT.     Patch Wear Time:  13 days and 23 hours (2023-02-10T10:12:14-0500 to 2023-02-24T09:21:34-0500)   Patient had a min HR of 43 bpm, max HR of 235 bpm, and avg HR of 71 bpm. Predominant underlying rhythm was Sinus Rhythm. First Degree AV Block was present. 2 Ventricular Tachycardia runs occurred, the run with the fastest interval lasting 5  beats with a  max rate of 235 bpm (avg 156 bpm); the run with the fastest interval was also the longest. 7 Supraventricular Tachycardia runs occurred, the run with the fastest interval lasting 4 beats with a max rate of 176 bpm, the longest lasting 15 beats with an  avg rate of 128 bpm. Some episodes of Supraventricular Tachycardia may be possible Atrial Tachycardia with variable block. Atrial Fibrillation occurred (2% burden), ranging from 75-167 bpm (avg of 120 bpm), the longest lasting 5 hours 40 mins with an avg  rate of 116 bpm. Supraventricular Tachycardia and Atrial Fibrillation were detected within +/- 45 seconds of symptomatic patient event(s). Isolated SVEs were frequent (11.5%, [id number]), SVE Couplets were frequent (5.5%, [id number]), and SVE Triplets were  occasional (1.7%, 8129). Isolated VEs were occasional (4.5%, [id number]), VE Couplets were rare (<1.0%, 596), and VE Triplets were rare (<1.0%, 41). Ventricular Bigeminy and Trigeminy were present.   Echo 01/02/24: IMPRESSIONS     1. Left ventricular ejection fraction, by estimation, is 65 to 70%. The  left ventricle has normal function. The left ventricle has no regional  wall motion abnormalities. There is mild left ventricular hypertrophy.  Left ventricular diastolic function  could not be evaluated.   2. Right ventricular systolic function is normal. The right ventricular  size is normal.   3. Left atrial size was mildly dilated.   4. The mitral valve is abnormal. Trivial mitral valve regurgitation.   5. The aortic valve is tricuspid. Aortic valve regurgitation is not  visualized. Aortic valve sclerosis/calcification is present, without any  evidence of aortic stenosis.   6. Aortic dilatation noted. There is mild dilatation of the ascending  aorta, measuring 41 mm.   7. The inferior vena cava is normal in size with greater than 50%  respiratory variability, suggesting right atrial pressure of 3 mmHg.   Comparison(s): Changes from prior  study are noted. 12/06/2021: LVEF 55-60%,  mild LAE.   Assessment:    1. Persistent atrial fibrillation (HCC)   2. Coronary artery disease involving native coronary artery of native heart without angina pectoris   3. PAD (peripheral artery disease)   4. Chronic systolic (congestive) heart failure (HCC)   5. Long term current use of anticoagulant therapy             Plan:    1. Chronic Combined Systolic and Diastolic CHF/  Secondary to ischemic/tachycardia mediated Cardiomyopathy - EF 25-30% in 2018 with ischemic cardiomyopathy. Not a candidate for revascularization. With medical management EF improved.  Last Echo in March with EF 65-70% - she is euvolemic at this time. Weight is stable.   - continue BB, aldactone , lasix  40 mg daily and statin. ARB discontinued due to orthostatic dizziness.   2. Paroxysmal Atrial Fibrillation/ multifocal atrial tachycardia. Now recurrent and persistent. On Coumadin .   On metoprolol  and diltiazem  for rate control. She is really limited in options for AAD therapy due to CHF, CAD and severe lung disease. Only option would be Tikosyn but she states she can't afford the hospitalization or the cost of medication. Will continue rate control strategy  3. HTN- labile. Will monitor on current meds for now.   4. COPD/Emphysema- - followed by Pulmonology. On oxygen  5. PFO - no plans for closure   6. PAD s/p covered stenting of left iliac. Stenosis in distal aorta and right femoral not amenable to percutaneous therapy. Left subclavian stenosis. Claudication is stable.   7. CAD. 100% RCA. 75% OM. Chronic stable angina class 1-2.  No active angina. Rarely uses Ntg.   8. S/p right retinal artery occlusion. Plavix  added to coumadin  due to severe vascular disease. I feel benefit of Plavix /coumadin  combination outweighs risk of bleeding at this time.  9. 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 Followed by Dr Rosemarie  10. Hypercholesterolemia. Now on Repatha . Excellent result  with LDL 52  11. Carotid arterial disease. No new Neurologic symptoms. Dopplers in January 2023 were unchanged.   12. 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 Reassured her about this. She is not a candidate for aortic surgery   I will follow up in 3 months   Signed, Palmina Clodfelter, MD  11/17/2024 1:44 PM    [facility] [address] [place], [place] [address] Phone: [phone] "

## 2024-11-17 ENCOUNTER — Ambulatory Visit: Attending: Cardiology | Admitting: Cardiology

## 2024-11-17 ENCOUNTER — Ambulatory Visit: Admitting: *Deleted

## 2024-11-17 ENCOUNTER — Encounter: Payer: Self-pay | Admitting: Internal Medicine

## 2024-11-17 ENCOUNTER — Encounter: Payer: Self-pay | Admitting: Cardiology

## 2024-11-17 VITALS — BP 124/60 | HR 124 | Ht 63.0 in | Wt 177.2 lb

## 2024-11-17 DIAGNOSIS — I2510 Atherosclerotic heart disease of native coronary artery without angina pectoris: Secondary | ICD-10-CM

## 2024-11-17 DIAGNOSIS — I5022 Chronic systolic (congestive) heart failure: Secondary | ICD-10-CM

## 2024-11-17 DIAGNOSIS — I480 Paroxysmal atrial fibrillation: Secondary | ICD-10-CM | POA: Diagnosis not present

## 2024-11-17 DIAGNOSIS — I4819 Other persistent atrial fibrillation: Secondary | ICD-10-CM

## 2024-11-17 DIAGNOSIS — I739 Peripheral vascular disease, unspecified: Secondary | ICD-10-CM

## 2024-11-17 DIAGNOSIS — Z7901 Long term (current) use of anticoagulants: Secondary | ICD-10-CM

## 2024-11-17 LAB — POCT INR: INR: 2.3 (ref 2.0–3.0)

## 2024-11-17 NOTE — Progress Notes (Signed)
 Description   INR-2.3; Continue taking warfarin 1/2 tablet daily except 1 tablet on Mondays and Fridays.  Recheck INR in 6 weeks.  Coumadin  Clinic [phone]

## 2024-11-17 NOTE — Patient Instructions (Signed)
 Description   INR-2.3; Continue taking warfarin 1/2 tablet daily except 1 tablet on Mondays and Fridays.  Recheck INR in 6 weeks.  Coumadin  Clinic [phone]

## 2024-11-17 NOTE — Patient Instructions (Addendum)
 Medication Instructions:  Continue same medications *If you need a refill on your cardiac medications before your next appointment, please call your pharmacy*  Lab Work: None ordered   Testing/Procedures: None ordered  Follow-Up: At [facility], you and your health needs are our priority.  As part of our continuing mission to provide you with exceptional heart care, our providers are all part of one team.  This team includes your primary Cardiologist (physician) and Advanced Practice Providers or APPs (Physician Assistants and Nurse Practitioners) who all work together to provide you with the care you need, when you need it.  Your next appointment:  3 months     Monday 4/13 at 10:20 am    Provider:  Dr.Jordan    We recommend signing up for the patient portal called MyChart.  Sign up information is provided on this After Visit Summary.  MyChart is used to connect with patients for Virtual Visits (Telemedicine).  Patients are able to view lab/test results, encounter notes, upcoming appointments, etc.  Non-urgent messages can be sent to your provider as well.   To learn more about what you can do with MyChart, go to [email or web address].

## 2024-11-22 ENCOUNTER — Encounter: Payer: Self-pay | Admitting: Internal Medicine

## 2024-11-23 ENCOUNTER — Telehealth: Payer: Self-pay

## 2024-11-23 DIAGNOSIS — J441 Chronic obstructive pulmonary disease with (acute) exacerbation: Secondary | ICD-10-CM

## 2024-11-23 MED ORDER — CLARITHROMYCIN 500 MG PO TABS: 500.0000 mg | ORAL_TABLET | Freq: Two times a day (BID) | ORAL | 0 refills | Status: AC

## 2024-11-23 MED ORDER — PREDNISONE 10 MG PO TABS: ORAL_TABLET | ORAL | 0 refills | Status: AC

## 2024-11-23 NOTE — Telephone Encounter (Signed)
Please advise Dr. [name].

## 2024-11-23 NOTE — Telephone Encounter (Signed)
" °  I sent the below medications.  Please inform her that and also make sure she is not allergic to the Biaxin   Please take prednisone  40 mg x1 day, then 30 mg x1 day, then 20 mg x1 day, then 10 mg x1 day, and then 5 mg x1 day and stop    - Biaxin  500mg  twice daily x 5 days "

## 2024-11-24 ENCOUNTER — Telehealth: Payer: Self-pay | Admitting: *Deleted

## 2024-11-24 NOTE — Telephone Encounter (Signed)
 ATC X1. LMTCB  I will send a mychart message to inform incase pt does not call back.

## 2024-11-24 NOTE — Telephone Encounter (Signed)
 Called pt and notified both scripts Copied from CRM [id number]. Topic: Clinical - Medication Refill >> Nov 22, 2024  4:00 PM Laurie Horton wrote: Medication: predniSONE  (DELTASONE ) 10 MG tablet, clarithromycin  (BIAXIN ) 250 MG tablet  Has the patient contacted their pharmacy? No, no retills (Agent: If no, request that the patient contact the pharmacy for the refill. If patient does not wish to contact the pharmacy document the reason why and proceed with request.) (Agent: If yes, when and what did the pharmacy advise?)  This is the patient's preferred pharmacy:  [id number] [address] [facility] - [address] [place] [address] Phone: [phone] Fax: [phone]  Is this the correct pharmacy for this prescription? Yes If no, delete pharmacy and type the correct one.   Has the prescription been filled recently? Yes  Is the patient out of the medication? Yes  Has the patient been seen for an appointment in the last year OR does the patient have an upcoming appointment? Yes  Can we respond through MyChart? No  Agent: Please be advised that Rx refills may take up to 3 business days. We ask that you follow-up with your pharmacy.

## 2024-11-24 NOTE — Telephone Encounter (Signed)
 Copied from CRM [id number]. Topic: Clinical - Medication Question >> Nov 24, 2024 12:26 PM Laurie Horton wrote: Reason for CRM: Patient [phone] is returning the office/[name]'s call, no detail information was left on voicemail. Patient states called about medications, cardiologist advised patient to call Dr. [name]; Patient states sent a message 11/17/24 and now is getting a response back. Unable to reach CAL, please call back or via Mychart.   Called and spoke to pt - pt states that she just spoke to [name] and is all taken care of. NFN.

## 2024-11-28 ENCOUNTER — Telehealth: Payer: Self-pay | Admitting: *Deleted

## 2024-11-28 NOTE — Telephone Encounter (Signed)
 I spoke to patient who is presently taking Prednisone  taper, which will be completed tomorrow 1/28.  I tried to get her an INR appt 1/29, but she has actually cancelled appts through next Wednesday due to weather.  I told her for now to reduce Warfarin dose to 0.5 tablet Friday and eat more greens.  She will call next week to schedule INR appt, pending weather.  She had stopped clarithromycin  after 2 days, because of CP.

## 2024-11-28 NOTE — Telephone Encounter (Signed)
 Patient left a voicemail that she was started on prednisone  10mg  and clarithromycin  500mg  on 11/25/24. Per chart she is on prednisone  10mg  taper prednisone  40 mg x1 day, then 30 mg x1 day, then 20 mg x1 day, then 10 mg x1 day, and then 5 mg x1 day and stop.   Both can increase INR and needs to be checked will need to call her regarding this.

## 2024-11-29 ENCOUNTER — Telehealth: Payer: Self-pay | Admitting: Neurology

## 2024-11-29 NOTE — Telephone Encounter (Signed)
 R/s due to weather

## 2024-12-04 ENCOUNTER — Ambulatory Visit: Admitting: Neurology

## 2024-12-05 ENCOUNTER — Ambulatory Visit: Admitting: Cardiology

## 2024-12-07 ENCOUNTER — Other Ambulatory Visit (HOSPITAL_COMMUNITY): Payer: Self-pay | Admitting: Psychiatry

## 2024-12-07 DIAGNOSIS — F411 Generalized anxiety disorder: Secondary | ICD-10-CM

## 2024-12-07 DIAGNOSIS — F331 Major depressive disorder, recurrent, moderate: Secondary | ICD-10-CM

## 2024-12-27 ENCOUNTER — Ambulatory Visit

## 2025-01-04 ENCOUNTER — Ambulatory Visit (HOSPITAL_COMMUNITY): Admitting: Psychiatry

## 2025-01-26 ENCOUNTER — Ambulatory Visit: Admitting: Internal Medicine

## 2025-02-12 ENCOUNTER — Ambulatory Visit: Admitting: Cardiology

## 2025-06-18 ENCOUNTER — Ambulatory Visit: Admitting: Neurology

## 2025-11-05 ENCOUNTER — Ambulatory Visit: Admitting: Neurology
